# Patient Record
Sex: Female | Born: 1935
Health system: Southern US, Community
[De-identification: ages and names within clinical notes are randomized; demographics above are authoritative.]

## PROBLEM LIST (undated history)

## (undated) DIAGNOSIS — K219 Gastro-esophageal reflux disease without esophagitis: Secondary | ICD-10-CM

## (undated) DIAGNOSIS — R202 Paresthesia of skin: Secondary | ICD-10-CM

## (undated) DIAGNOSIS — I10 Essential (primary) hypertension: Secondary | ICD-10-CM

## (undated) DIAGNOSIS — B3781 Candidal esophagitis: Secondary | ICD-10-CM

## (undated) DIAGNOSIS — L03115 Cellulitis of right lower limb: Secondary | ICD-10-CM

## (undated) DIAGNOSIS — R928 Other abnormal and inconclusive findings on diagnostic imaging of breast: Secondary | ICD-10-CM

## (undated) DIAGNOSIS — Z9889 Other specified postprocedural states: Secondary | ICD-10-CM

## (undated) DIAGNOSIS — S91302A Unspecified open wound, left foot, initial encounter: Secondary | ICD-10-CM

## (undated) DIAGNOSIS — R351 Nocturia: Secondary | ICD-10-CM

## (undated) DIAGNOSIS — J309 Allergic rhinitis, unspecified: Secondary | ICD-10-CM

## (undated) DIAGNOSIS — Z86010 Personal history of colonic polyps: Secondary | ICD-10-CM

## (undated) DIAGNOSIS — M545 Low back pain, unspecified: Secondary | ICD-10-CM

## (undated) DIAGNOSIS — I517 Cardiomegaly: Secondary | ICD-10-CM

## (undated) DIAGNOSIS — J449 Chronic obstructive pulmonary disease, unspecified: Secondary | ICD-10-CM

## (undated) DIAGNOSIS — N183 Chronic kidney disease, stage 3 unspecified: Secondary | ICD-10-CM

## (undated) DIAGNOSIS — D3500 Benign neoplasm of unspecified adrenal gland: Secondary | ICD-10-CM

## (undated) DIAGNOSIS — D126 Benign neoplasm of colon, unspecified: Secondary | ICD-10-CM

## (undated) DIAGNOSIS — D509 Iron deficiency anemia, unspecified: Secondary | ICD-10-CM

## (undated) DIAGNOSIS — S41119A Laceration without foreign body of unspecified upper arm, initial encounter: Secondary | ICD-10-CM

## (undated) DIAGNOSIS — I495 Sick sinus syndrome: Secondary | ICD-10-CM

## (undated) DIAGNOSIS — N179 Acute kidney failure, unspecified: Secondary | ICD-10-CM

## (undated) DIAGNOSIS — J441 Chronic obstructive pulmonary disease with (acute) exacerbation: Secondary | ICD-10-CM

## (undated) DIAGNOSIS — J189 Pneumonia, unspecified organism: Secondary | ICD-10-CM

## (undated) DIAGNOSIS — G3184 Mild cognitive impairment, so stated: Secondary | ICD-10-CM

## (undated) DIAGNOSIS — G250 Essential tremor: Secondary | ICD-10-CM

## (undated) DIAGNOSIS — I871 Compression of vein: Secondary | ICD-10-CM

## (undated) DIAGNOSIS — Z7409 Other reduced mobility: Secondary | ICD-10-CM

## (undated) DIAGNOSIS — L03116 Cellulitis of left lower limb: Secondary | ICD-10-CM

## (undated) DIAGNOSIS — K449 Diaphragmatic hernia without obstruction or gangrene: Secondary | ICD-10-CM

## (undated) DIAGNOSIS — R7309 Other abnormal glucose: Secondary | ICD-10-CM

## (undated) DIAGNOSIS — H3530 Unspecified macular degeneration: Secondary | ICD-10-CM

## (undated) DIAGNOSIS — R200 Anesthesia of skin: Secondary | ICD-10-CM

## (undated) DIAGNOSIS — N3941 Urge incontinence: Secondary | ICD-10-CM

## (undated) DIAGNOSIS — I252 Old myocardial infarction: Secondary | ICD-10-CM

## (undated) DIAGNOSIS — R079 Chest pain, unspecified: Secondary | ICD-10-CM

## (undated) DIAGNOSIS — M5000 Cervical disc disorder with myelopathy, unspecified cervical region: Secondary | ICD-10-CM

## (undated) DIAGNOSIS — M48061 Spinal stenosis, lumbar region without neurogenic claudication: Secondary | ICD-10-CM

## (undated) DIAGNOSIS — W19XXXA Unspecified fall, initial encounter: Secondary | ICD-10-CM

## (undated) DIAGNOSIS — Z8639 Personal history of other endocrine, nutritional and metabolic disease: Secondary | ICD-10-CM

## (undated) DIAGNOSIS — S92309A Fracture of unspecified metatarsal bone(s), unspecified foot, initial encounter for closed fracture: Secondary | ICD-10-CM

## (undated) DIAGNOSIS — F411 Generalized anxiety disorder: Secondary | ICD-10-CM

## (undated) DIAGNOSIS — R252 Cramp and spasm: Secondary | ICD-10-CM

## (undated) DIAGNOSIS — M5126 Other intervertebral disc displacement, lumbar region: Secondary | ICD-10-CM

## (undated) DIAGNOSIS — Z905 Acquired absence of kidney: Secondary | ICD-10-CM

## (undated) DIAGNOSIS — K3532 Acute appendicitis with perforation and localized peritonitis, without abscess: Secondary | ICD-10-CM

## (undated) DIAGNOSIS — I5030 Unspecified diastolic (congestive) heart failure: Secondary | ICD-10-CM

## (undated) DIAGNOSIS — Z79899 Other long term (current) drug therapy: Secondary | ICD-10-CM

## (undated) DIAGNOSIS — Z8719 Personal history of other diseases of the digestive system: Secondary | ICD-10-CM

## (undated) DIAGNOSIS — H3562 Retinal hemorrhage, left eye: Secondary | ICD-10-CM

## (undated) DIAGNOSIS — Z950 Presence of cardiac pacemaker: Secondary | ICD-10-CM

## (undated) DIAGNOSIS — L814 Other melanin hyperpigmentation: Secondary | ICD-10-CM

## (undated) DIAGNOSIS — I83023 Varicose veins of left lower extremity with ulcer of ankle: Secondary | ICD-10-CM

## (undated) DIAGNOSIS — I4819 Other persistent atrial fibrillation: Secondary | ICD-10-CM

## (undated) DIAGNOSIS — I480 Paroxysmal atrial fibrillation: Secondary | ICD-10-CM

## (undated) DIAGNOSIS — I959 Hypotension, unspecified: Secondary | ICD-10-CM

## (undated) DIAGNOSIS — S060X9A Concussion with loss of consciousness of unspecified duration, initial encounter: Secondary | ICD-10-CM

## (undated) DIAGNOSIS — S92353A Displaced fracture of fifth metatarsal bone, unspecified foot, initial encounter for closed fracture: Secondary | ICD-10-CM

## (undated) DIAGNOSIS — I5033 Acute on chronic diastolic (congestive) heart failure: Secondary | ICD-10-CM

## (undated) DIAGNOSIS — M10239 Drug-induced gout, unspecified wrist: Secondary | ICD-10-CM

## (undated) DIAGNOSIS — M25512 Pain in left shoulder: Secondary | ICD-10-CM

## (undated) DIAGNOSIS — M542 Cervicalgia: Secondary | ICD-10-CM

## (undated) DIAGNOSIS — M62830 Muscle spasm of back: Secondary | ICD-10-CM

## (undated) DIAGNOSIS — L821 Other seborrheic keratosis: Secondary | ICD-10-CM

## (undated) DIAGNOSIS — E538 Deficiency of other specified B group vitamins: Secondary | ICD-10-CM

## (undated) DIAGNOSIS — S99929A Unspecified injury of unspecified foot, initial encounter: Secondary | ICD-10-CM

## (undated) DIAGNOSIS — R0600 Dyspnea, unspecified: Secondary | ICD-10-CM

## (undated) DIAGNOSIS — S9032XA Contusion of left foot, initial encounter: Secondary | ICD-10-CM

## (undated) DIAGNOSIS — F0390 Unspecified dementia without behavioral disturbance: Secondary | ICD-10-CM

## (undated) DIAGNOSIS — C669 Malignant neoplasm of unspecified ureter: Secondary | ICD-10-CM

## (undated) DIAGNOSIS — S6991XA Unspecified injury of right wrist, hand and finger(s), initial encounter: Secondary | ICD-10-CM

## (undated) HISTORY — DX: Cellulitis of right lower limb: L03.115

## (undated) HISTORY — DX: Acquired absence of kidney: Z90.5

## (undated) HISTORY — DX: Personal history of other endocrine, nutritional and metabolic disease: Z86.39

## (undated) HISTORY — DX: Personal history of colonic polyps: Z86.010

## (undated) HISTORY — DX: Presence of cardiac pacemaker: Z95.0

## (undated) HISTORY — DX: Iron deficiency anemia, unspecified: D50.9

## (undated) HISTORY — DX: Mild cognitive impairment, so stated: G31.84

## (undated) HISTORY — DX: Personal history of other diseases of the digestive system: Z87.19

## (undated) HISTORY — DX: Other reduced mobility: Z74.09

## (undated) HISTORY — DX: Other specified postprocedural states: Z98.890

## (undated) HISTORY — DX: Chronic kidney disease, stage 3 unspecified: N18.30

## (undated) HISTORY — DX: Unspecified dementia without behavioral disturbance: F03.90

## (undated) HISTORY — DX: Generalized anxiety disorder: F41.1

## (undated) HISTORY — DX: Muscle spasm of back: M62.830

## (undated) HISTORY — DX: Unspecified injury of unspecified foot, initial encounter: S99.929A

## (undated) HISTORY — DX: Pneumonia, unspecified organism: J18.9

## (undated) HISTORY — DX: Chronic obstructive pulmonary disease with (acute) exacerbation: J44.1

## (undated) HISTORY — DX: Chest pain, unspecified: R07.9

## (undated) HISTORY — DX: Hypotension, unspecified: I95.9

## (undated) HISTORY — DX: Old myocardial infarction: I25.2

## (undated) HISTORY — DX: Nocturia: R35.1

## (undated) HISTORY — DX: Cellulitis of left lower limb: L03.116

## (undated) HISTORY — DX: Sick sinus syndrome: I49.5

## (undated) HISTORY — DX: Paroxysmal atrial fibrillation: I48.0

## (undated) HISTORY — PX: KNEE ARTHROSCOPY W/ SYNOVECTOMY: SHX1887

## (undated) HISTORY — DX: Malignant neoplasm of unspecified ureter: C66.9

## (undated) HISTORY — DX: Retinal hemorrhage, left eye: H35.62

## (undated) HISTORY — DX: Acute appendicitis with perforation, localized peritonitis, and gangrene, without abscess: K35.32

## (undated) HISTORY — DX: Chronic obstructive pulmonary disease, unspecified: J44.9

## (undated) HISTORY — DX: Deficiency of other specified B group vitamins: E53.8

## (undated) HISTORY — PX: CHOLECYSTECTOMY: SHX55

## (undated) HISTORY — DX: Acute on chronic diastolic (congestive) heart failure: I50.33

## (undated) HISTORY — PX: REPLACEMENT TOTAL KNEE: SUR1224

## (undated) HISTORY — DX: Diaphragmatic hernia without obstruction or gangrene: K44.9

## (undated) HISTORY — DX: Urge incontinence: N39.41

## (undated) HISTORY — DX: Anesthesia of skin: R20.0

## (undated) HISTORY — DX: Chronic kidney disease, stage 3 (moderate): N18.3

## (undated) HISTORY — DX: Benign neoplasm of colon, unspecified: D12.6

## (undated) HISTORY — PX: TONSILLECTOMY: SUR1361

## (undated) HISTORY — DX: Paresthesia of skin: R20.2

## (undated) HISTORY — DX: Other abnormal glucose: R73.09

## (undated) HISTORY — DX: Other abnormal and inconclusive findings on diagnostic imaging of breast: R92.8

## (undated) HISTORY — DX: Benign neoplasm of unspecified adrenal gland: D35.00

## (undated) HISTORY — PX: BREAST SURGERY: SHX581

## (undated) HISTORY — DX: Candidal esophagitis: B37.81

## (undated) HISTORY — DX: Compression of vein: I87.1

## (undated) HISTORY — PX: EYE SURGERY: SHX253

## (undated) HISTORY — DX: Cramp and spasm: R25.2

## (undated) HISTORY — DX: Unspecified macular degeneration: H35.30

## (undated) HISTORY — PX: NEPHRECTOMY: SHX65

## (undated) HISTORY — DX: Drug-induced gout, unspecified wrist: M10.239

## (undated) HISTORY — DX: Essential tremor: G25.0

## (undated) HISTORY — DX: Other long term (current) drug therapy: Z79.899

## (undated) HISTORY — DX: Essential (primary) hypertension: I10

## (undated) HISTORY — DX: Other intervertebral disc displacement, lumbar region: M51.26

## (undated) HISTORY — DX: Unspecified diastolic (congestive) heart failure: I50.30

## (undated) HISTORY — DX: Low back pain, unspecified: M54.50

## (undated) HISTORY — DX: Allergic rhinitis, unspecified: J30.9

## (undated) HISTORY — DX: Other melanin hyperpigmentation: L81.4

## (undated) HISTORY — DX: Acute kidney failure, unspecified: N17.9

## (undated) HISTORY — DX: Concussion with loss of consciousness of unspecified duration, initial encounter: S06.0X9A

## (undated) HISTORY — DX: Spinal stenosis, lumbar region without neurogenic claudication: M48.061

## (undated) HISTORY — DX: Pain in left shoulder: M25.512

## (undated) HISTORY — DX: Other seborrheic keratosis: L82.1

## (undated) HISTORY — DX: Cardiomegaly: I51.7

## (undated) HISTORY — DX: Cervical disc disorder with myelopathy, unspecified cervical region: M50.00

---
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
## 1997-05-29 ENCOUNTER — Encounter (HOSPITAL_COMMUNITY): Admission: RE | Admit: 1997-05-29 | Discharge: 1997-08-27 | Payer: Self-pay | Admitting: Family Medicine

## 1997-06-13 ENCOUNTER — Inpatient Hospital Stay (HOSPITAL_COMMUNITY): Admission: RE | Admit: 1997-06-13 | Discharge: 1997-06-22 | Payer: Self-pay | Admitting: Urology

## 1997-08-28 ENCOUNTER — Encounter: Admission: RE | Admit: 1997-08-28 | Discharge: 1997-08-28 | Payer: Self-pay | Admitting: Family Medicine

## 1997-12-30 ENCOUNTER — Encounter: Admission: RE | Admit: 1997-12-30 | Discharge: 1997-12-30 | Payer: Self-pay | Admitting: Sports Medicine

## 1998-01-20 ENCOUNTER — Encounter: Admission: RE | Admit: 1998-01-20 | Discharge: 1998-01-20 | Payer: Self-pay | Admitting: Sports Medicine

## 1998-08-31 ENCOUNTER — Encounter: Admission: RE | Admit: 1998-08-31 | Discharge: 1998-08-31 | Payer: Self-pay | Admitting: Family Medicine

## 1998-09-04 ENCOUNTER — Encounter: Admission: RE | Admit: 1998-09-04 | Discharge: 1998-09-04 | Payer: Self-pay | Admitting: Family Medicine

## 1999-07-30 ENCOUNTER — Encounter (INDEPENDENT_AMBULATORY_CARE_PROVIDER_SITE_OTHER): Payer: Self-pay | Admitting: *Deleted

## 1999-07-30 LAB — CONVERTED CEMR LAB

## 1999-09-06 ENCOUNTER — Encounter: Admission: RE | Admit: 1999-09-06 | Discharge: 1999-09-06 | Payer: Self-pay | Admitting: Family Medicine

## 1999-09-08 ENCOUNTER — Encounter: Admission: RE | Admit: 1999-09-08 | Discharge: 1999-09-08 | Payer: Self-pay | Admitting: Family Medicine

## 1999-09-17 ENCOUNTER — Encounter: Admission: RE | Admit: 1999-09-17 | Discharge: 1999-09-17 | Payer: Self-pay | Admitting: Sports Medicine

## 2000-03-16 ENCOUNTER — Encounter: Admission: RE | Admit: 2000-03-16 | Discharge: 2000-03-16 | Payer: Self-pay | Admitting: Family Medicine

## 2000-06-07 ENCOUNTER — Ambulatory Visit (HOSPITAL_COMMUNITY): Admission: RE | Admit: 2000-06-07 | Discharge: 2000-06-07 | Payer: Self-pay | Admitting: Gastroenterology

## 2000-06-07 ENCOUNTER — Encounter (INDEPENDENT_AMBULATORY_CARE_PROVIDER_SITE_OTHER): Payer: Self-pay | Admitting: Specialist

## 2000-07-28 ENCOUNTER — Encounter: Admission: RE | Admit: 2000-07-28 | Discharge: 2000-07-28 | Payer: Self-pay | Admitting: Family Medicine

## 2000-08-03 ENCOUNTER — Encounter: Admission: RE | Admit: 2000-08-03 | Discharge: 2000-08-03 | Payer: Self-pay | Admitting: Family Medicine

## 2000-09-07 ENCOUNTER — Encounter: Admission: RE | Admit: 2000-09-07 | Discharge: 2000-09-07 | Payer: Self-pay | Admitting: Family Medicine

## 2000-12-22 ENCOUNTER — Encounter: Admission: RE | Admit: 2000-12-22 | Discharge: 2000-12-22 | Payer: Self-pay | Admitting: Family Medicine

## 2000-12-28 ENCOUNTER — Encounter: Admission: RE | Admit: 2000-12-28 | Discharge: 2000-12-28 | Payer: Self-pay | Admitting: Family Medicine

## 2001-01-01 ENCOUNTER — Encounter: Admission: RE | Admit: 2001-01-01 | Discharge: 2001-01-01 | Payer: Self-pay | Admitting: Family Medicine

## 2001-01-01 ENCOUNTER — Encounter: Payer: Self-pay | Admitting: Family Medicine

## 2001-01-11 ENCOUNTER — Encounter: Admission: RE | Admit: 2001-01-11 | Discharge: 2001-01-11 | Payer: Self-pay | Admitting: Family Medicine

## 2001-03-01 ENCOUNTER — Encounter: Admission: RE | Admit: 2001-03-01 | Discharge: 2001-03-01 | Payer: Self-pay | Admitting: Family Medicine

## 2001-03-02 ENCOUNTER — Ambulatory Visit (HOSPITAL_COMMUNITY): Admission: RE | Admit: 2001-03-02 | Discharge: 2001-03-02 | Payer: Self-pay | Admitting: Family Medicine

## 2001-03-02 ENCOUNTER — Encounter: Payer: Self-pay | Admitting: Family Medicine

## 2001-03-05 ENCOUNTER — Encounter: Admission: RE | Admit: 2001-03-05 | Discharge: 2001-03-05 | Payer: Self-pay | Admitting: Family Medicine

## 2001-04-16 ENCOUNTER — Encounter: Admission: RE | Admit: 2001-04-16 | Discharge: 2001-04-16 | Payer: Self-pay | Admitting: Family Medicine

## 2001-04-17 ENCOUNTER — Encounter: Admission: RE | Admit: 2001-04-17 | Discharge: 2001-04-17 | Payer: Self-pay | Admitting: Family Medicine

## 2001-04-17 ENCOUNTER — Encounter: Payer: Self-pay | Admitting: Family Medicine

## 2001-04-19 ENCOUNTER — Encounter: Admission: RE | Admit: 2001-04-19 | Discharge: 2001-04-19 | Payer: Self-pay | Admitting: Family Medicine

## 2001-04-23 ENCOUNTER — Encounter: Admission: RE | Admit: 2001-04-23 | Discharge: 2001-04-23 | Payer: Self-pay | Admitting: Family Medicine

## 2001-04-23 ENCOUNTER — Encounter: Payer: Self-pay | Admitting: Family Medicine

## 2001-05-14 ENCOUNTER — Encounter: Payer: Self-pay | Admitting: Neurosurgery

## 2001-05-14 ENCOUNTER — Encounter: Admission: RE | Admit: 2001-05-14 | Discharge: 2001-05-14 | Payer: Self-pay | Admitting: Neurosurgery

## 2001-06-18 ENCOUNTER — Encounter: Admission: RE | Admit: 2001-06-18 | Discharge: 2001-06-18 | Payer: Self-pay | Admitting: Family Medicine

## 2001-07-02 ENCOUNTER — Encounter: Admission: RE | Admit: 2001-07-02 | Discharge: 2001-07-02 | Payer: Self-pay | Admitting: Family Medicine

## 2001-07-19 ENCOUNTER — Encounter: Admission: RE | Admit: 2001-07-19 | Discharge: 2001-07-19 | Payer: Self-pay | Admitting: Family Medicine

## 2001-08-20 ENCOUNTER — Ambulatory Visit (HOSPITAL_COMMUNITY): Admission: RE | Admit: 2001-08-20 | Discharge: 2001-08-20 | Payer: Self-pay | Admitting: Family Medicine

## 2001-08-20 ENCOUNTER — Encounter: Admission: RE | Admit: 2001-08-20 | Discharge: 2001-08-20 | Payer: Self-pay | Admitting: Family Medicine

## 2001-09-04 ENCOUNTER — Ambulatory Visit: Admission: RE | Admit: 2001-09-04 | Discharge: 2001-09-04 | Payer: Self-pay | Admitting: Family Medicine

## 2001-09-04 ENCOUNTER — Encounter (INDEPENDENT_AMBULATORY_CARE_PROVIDER_SITE_OTHER): Payer: Self-pay | Admitting: Cardiology

## 2001-10-30 ENCOUNTER — Other Ambulatory Visit: Admission: RE | Admit: 2001-10-30 | Discharge: 2001-10-30 | Payer: Self-pay | Admitting: Obstetrics and Gynecology

## 2001-11-19 ENCOUNTER — Ambulatory Visit (HOSPITAL_COMMUNITY): Admission: RE | Admit: 2001-11-19 | Discharge: 2001-11-19 | Payer: Self-pay | Admitting: Obstetrics and Gynecology

## 2001-11-19 ENCOUNTER — Encounter: Payer: Self-pay | Admitting: Obstetrics and Gynecology

## 2002-01-28 ENCOUNTER — Encounter: Admission: RE | Admit: 2002-01-28 | Discharge: 2002-01-28 | Payer: Self-pay | Admitting: Family Medicine

## 2002-01-29 ENCOUNTER — Encounter: Admission: RE | Admit: 2002-01-29 | Discharge: 2002-01-29 | Payer: Self-pay | Admitting: Family Medicine

## 2002-02-01 ENCOUNTER — Encounter: Payer: Self-pay | Admitting: Family Medicine

## 2002-02-01 ENCOUNTER — Encounter: Admission: RE | Admit: 2002-02-01 | Discharge: 2002-02-01 | Payer: Self-pay | Admitting: Family Medicine

## 2002-02-15 ENCOUNTER — Encounter: Admission: RE | Admit: 2002-02-15 | Discharge: 2002-02-15 | Payer: Self-pay | Admitting: Sports Medicine

## 2002-05-27 ENCOUNTER — Encounter: Admission: RE | Admit: 2002-05-27 | Discharge: 2002-05-27 | Payer: Self-pay | Admitting: Family Medicine

## 2002-09-09 ENCOUNTER — Encounter: Admission: RE | Admit: 2002-09-09 | Discharge: 2002-09-09 | Payer: Self-pay | Admitting: Family Medicine

## 2002-10-31 ENCOUNTER — Encounter: Admission: RE | Admit: 2002-10-31 | Discharge: 2002-10-31 | Payer: Self-pay | Admitting: Family Medicine

## 2002-11-28 ENCOUNTER — Encounter: Admission: RE | Admit: 2002-11-28 | Discharge: 2002-11-28 | Payer: Self-pay | Admitting: Family Medicine

## 2002-12-05 ENCOUNTER — Encounter: Admission: RE | Admit: 2002-12-05 | Discharge: 2002-12-05 | Payer: Self-pay | Admitting: Family Medicine

## 2002-12-12 ENCOUNTER — Encounter: Admission: RE | Admit: 2002-12-12 | Discharge: 2002-12-12 | Payer: Self-pay | Admitting: Family Medicine

## 2002-12-31 ENCOUNTER — Encounter: Admission: RE | Admit: 2002-12-31 | Discharge: 2002-12-31 | Payer: Self-pay | Admitting: Orthopedic Surgery

## 2003-01-09 ENCOUNTER — Encounter: Admission: RE | Admit: 2003-01-09 | Discharge: 2003-01-09 | Payer: Self-pay | Admitting: Family Medicine

## 2003-01-13 ENCOUNTER — Encounter: Admission: RE | Admit: 2003-01-13 | Discharge: 2003-01-13 | Payer: Self-pay | Admitting: Family Medicine

## 2003-02-10 ENCOUNTER — Encounter: Admission: RE | Admit: 2003-02-10 | Discharge: 2003-02-10 | Payer: Self-pay | Admitting: Family Medicine

## 2003-03-01 ENCOUNTER — Inpatient Hospital Stay (HOSPITAL_COMMUNITY): Admission: EM | Admit: 2003-03-01 | Discharge: 2003-03-06 | Payer: Self-pay | Admitting: Emergency Medicine

## 2003-03-01 DIAGNOSIS — D126 Benign neoplasm of colon, unspecified: Secondary | ICD-10-CM

## 2003-03-01 HISTORY — PX: PACEMAKER INSERTION: SHX728

## 2003-03-01 HISTORY — DX: Benign neoplasm of colon, unspecified: D12.6

## 2003-03-01 HISTORY — PX: PACEMAKER LEAD REMOVAL: SHX5064

## 2003-03-03 ENCOUNTER — Encounter (INDEPENDENT_AMBULATORY_CARE_PROVIDER_SITE_OTHER): Payer: Self-pay | Admitting: Cardiology

## 2003-03-04 ENCOUNTER — Encounter (INDEPENDENT_AMBULATORY_CARE_PROVIDER_SITE_OTHER): Payer: Self-pay | Admitting: Interventional Cardiology

## 2003-05-05 ENCOUNTER — Encounter: Admission: RE | Admit: 2003-05-05 | Discharge: 2003-05-05 | Payer: Self-pay | Admitting: Family Medicine

## 2003-05-08 ENCOUNTER — Encounter: Admission: RE | Admit: 2003-05-08 | Discharge: 2003-05-08 | Payer: Self-pay | Admitting: Family Medicine

## 2003-05-22 ENCOUNTER — Encounter: Admission: RE | Admit: 2003-05-22 | Discharge: 2003-05-22 | Payer: Self-pay | Admitting: Family Medicine

## 2003-05-26 ENCOUNTER — Encounter: Admission: RE | Admit: 2003-05-26 | Discharge: 2003-05-26 | Payer: Self-pay | Admitting: Family Medicine

## 2003-06-05 ENCOUNTER — Encounter: Admission: RE | Admit: 2003-06-05 | Discharge: 2003-06-05 | Payer: Self-pay | Admitting: Family Medicine

## 2003-06-19 ENCOUNTER — Encounter: Admission: RE | Admit: 2003-06-19 | Discharge: 2003-06-19 | Payer: Self-pay | Admitting: Family Medicine

## 2003-07-11 ENCOUNTER — Ambulatory Visit (HOSPITAL_COMMUNITY): Admission: RE | Admit: 2003-07-11 | Discharge: 2003-07-11 | Payer: Self-pay | Admitting: Family Medicine

## 2003-07-11 ENCOUNTER — Encounter: Admission: RE | Admit: 2003-07-11 | Discharge: 2003-07-11 | Payer: Self-pay | Admitting: Family Medicine

## 2003-07-17 ENCOUNTER — Inpatient Hospital Stay (HOSPITAL_COMMUNITY): Admission: EM | Admit: 2003-07-17 | Discharge: 2003-07-20 | Payer: Self-pay | Admitting: Emergency Medicine

## 2003-07-21 ENCOUNTER — Inpatient Hospital Stay (HOSPITAL_COMMUNITY): Admission: EM | Admit: 2003-07-21 | Discharge: 2003-07-25 | Payer: Self-pay | Admitting: Emergency Medicine

## 2003-07-31 ENCOUNTER — Ambulatory Visit (HOSPITAL_COMMUNITY): Admission: RE | Admit: 2003-07-31 | Discharge: 2003-08-01 | Payer: Self-pay | Admitting: Internal Medicine

## 2003-09-26 ENCOUNTER — Other Ambulatory Visit: Admission: RE | Admit: 2003-09-26 | Discharge: 2003-09-26 | Payer: Self-pay | Admitting: Obstetrics and Gynecology

## 2003-10-06 ENCOUNTER — Encounter: Admission: RE | Admit: 2003-10-06 | Discharge: 2003-10-06 | Payer: Self-pay | Admitting: Family Medicine

## 2003-11-14 ENCOUNTER — Ambulatory Visit: Payer: Self-pay | Admitting: Family Medicine

## 2004-01-08 ENCOUNTER — Ambulatory Visit: Payer: Self-pay | Admitting: Critical Care Medicine

## 2004-02-03 ENCOUNTER — Ambulatory Visit (HOSPITAL_COMMUNITY): Admission: RE | Admit: 2004-02-03 | Discharge: 2004-02-03 | Payer: Self-pay | Admitting: Gastroenterology

## 2004-02-03 ENCOUNTER — Encounter (INDEPENDENT_AMBULATORY_CARE_PROVIDER_SITE_OTHER): Payer: Self-pay | Admitting: *Deleted

## 2004-02-20 ENCOUNTER — Ambulatory Visit: Payer: Self-pay | Admitting: Critical Care Medicine

## 2004-03-26 ENCOUNTER — Ambulatory Visit: Payer: Self-pay | Admitting: Critical Care Medicine

## 2004-04-08 ENCOUNTER — Ambulatory Visit: Payer: Self-pay | Admitting: Family Medicine

## 2004-05-13 ENCOUNTER — Ambulatory Visit: Payer: Self-pay | Admitting: Family Medicine

## 2004-05-17 ENCOUNTER — Encounter: Admission: RE | Admit: 2004-05-17 | Discharge: 2004-05-17 | Payer: Self-pay | Admitting: Family Medicine

## 2004-05-25 ENCOUNTER — Ambulatory Visit: Payer: Self-pay | Admitting: Critical Care Medicine

## 2004-06-28 ENCOUNTER — Ambulatory Visit: Payer: Self-pay | Admitting: Critical Care Medicine

## 2004-07-01 ENCOUNTER — Ambulatory Visit: Admission: RE | Admit: 2004-07-01 | Discharge: 2004-07-01 | Payer: Self-pay | Admitting: Critical Care Medicine

## 2004-07-19 ENCOUNTER — Ambulatory Visit: Payer: Self-pay | Admitting: Family Medicine

## 2004-08-06 ENCOUNTER — Ambulatory Visit: Payer: Self-pay | Admitting: Critical Care Medicine

## 2004-08-12 ENCOUNTER — Ambulatory Visit: Payer: Self-pay | Admitting: Family Medicine

## 2004-09-13 ENCOUNTER — Ambulatory Visit: Payer: Self-pay | Admitting: Critical Care Medicine

## 2004-09-21 ENCOUNTER — Emergency Department (HOSPITAL_COMMUNITY): Admission: EM | Admit: 2004-09-21 | Discharge: 2004-09-21 | Payer: Self-pay | Admitting: Emergency Medicine

## 2004-09-23 ENCOUNTER — Ambulatory Visit: Payer: Self-pay | Admitting: Family Medicine

## 2004-10-04 ENCOUNTER — Ambulatory Visit: Payer: Self-pay | Admitting: Family Medicine

## 2004-10-05 ENCOUNTER — Ambulatory Visit: Payer: Self-pay | Admitting: Critical Care Medicine

## 2005-03-09 ENCOUNTER — Ambulatory Visit: Payer: Self-pay | Admitting: Internal Medicine

## 2005-03-10 ENCOUNTER — Ambulatory Visit: Payer: Self-pay | Admitting: Family Medicine

## 2005-04-15 ENCOUNTER — Encounter: Admission: RE | Admit: 2005-04-15 | Discharge: 2005-04-15 | Payer: Self-pay | Admitting: Family Medicine

## 2005-04-15 ENCOUNTER — Ambulatory Visit: Payer: Self-pay | Admitting: Sports Medicine

## 2005-07-12 ENCOUNTER — Ambulatory Visit: Payer: Self-pay | Admitting: Sports Medicine

## 2005-07-17 ENCOUNTER — Ambulatory Visit: Payer: Self-pay | Admitting: Family Medicine

## 2005-07-19 ENCOUNTER — Observation Stay (HOSPITAL_COMMUNITY): Admission: EM | Admit: 2005-07-19 | Discharge: 2005-07-21 | Payer: Self-pay | Admitting: Emergency Medicine

## 2005-08-11 ENCOUNTER — Ambulatory Visit: Payer: Self-pay | Admitting: Family Medicine

## 2005-09-08 ENCOUNTER — Ambulatory Visit: Payer: Self-pay | Admitting: Family Medicine

## 2005-12-18 ENCOUNTER — Emergency Department (HOSPITAL_COMMUNITY): Admission: EM | Admit: 2005-12-18 | Discharge: 2005-12-18 | Payer: Self-pay | Admitting: Emergency Medicine

## 2005-12-22 ENCOUNTER — Ambulatory Visit: Payer: Self-pay | Admitting: Family Medicine

## 2005-12-28 ENCOUNTER — Encounter: Admission: RE | Admit: 2005-12-28 | Discharge: 2005-12-28 | Payer: Self-pay | Admitting: Family Medicine

## 2005-12-28 ENCOUNTER — Ambulatory Visit: Payer: Self-pay | Admitting: Family Medicine

## 2006-01-02 ENCOUNTER — Ambulatory Visit: Payer: Self-pay | Admitting: Family Medicine

## 2006-02-27 ENCOUNTER — Inpatient Hospital Stay (HOSPITAL_COMMUNITY): Admission: EM | Admit: 2006-02-27 | Discharge: 2006-03-01 | Payer: Self-pay | Admitting: Family Medicine

## 2006-02-27 ENCOUNTER — Ambulatory Visit: Payer: Self-pay | Admitting: Family Medicine

## 2006-03-02 ENCOUNTER — Ambulatory Visit: Payer: Self-pay | Admitting: Family Medicine

## 2006-03-16 ENCOUNTER — Ambulatory Visit: Payer: Self-pay | Admitting: Family Medicine

## 2006-03-30 ENCOUNTER — Ambulatory Visit: Payer: Self-pay | Admitting: Family Medicine

## 2006-04-27 DIAGNOSIS — M1710 Unilateral primary osteoarthritis, unspecified knee: Secondary | ICD-10-CM

## 2006-04-27 DIAGNOSIS — J449 Chronic obstructive pulmonary disease, unspecified: Secondary | ICD-10-CM

## 2006-04-27 DIAGNOSIS — I871 Compression of vein: Secondary | ICD-10-CM

## 2006-04-27 DIAGNOSIS — M5000 Cervical disc disorder with myelopathy, unspecified cervical region: Secondary | ICD-10-CM

## 2006-04-27 DIAGNOSIS — F411 Generalized anxiety disorder: Secondary | ICD-10-CM

## 2006-04-27 DIAGNOSIS — I495 Sick sinus syndrome: Secondary | ICD-10-CM | POA: Insufficient documentation

## 2006-04-27 DIAGNOSIS — K219 Gastro-esophageal reflux disease without esophagitis: Secondary | ICD-10-CM

## 2006-04-27 DIAGNOSIS — J302 Other seasonal allergic rhinitis: Secondary | ICD-10-CM

## 2006-04-27 DIAGNOSIS — F064 Anxiety disorder due to known physiological condition: Secondary | ICD-10-CM

## 2006-04-27 DIAGNOSIS — K649 Unspecified hemorrhoids: Secondary | ICD-10-CM | POA: Insufficient documentation

## 2006-04-27 DIAGNOSIS — E785 Hyperlipidemia, unspecified: Secondary | ICD-10-CM

## 2006-04-27 DIAGNOSIS — K449 Diaphragmatic hernia without obstruction or gangrene: Secondary | ICD-10-CM

## 2006-04-27 DIAGNOSIS — J4489 Other specified chronic obstructive pulmonary disease: Secondary | ICD-10-CM

## 2006-04-27 DIAGNOSIS — J309 Allergic rhinitis, unspecified: Secondary | ICD-10-CM

## 2006-04-27 DIAGNOSIS — F410 Panic disorder [episodic paroxysmal anxiety] without agoraphobia: Secondary | ICD-10-CM

## 2006-04-27 DIAGNOSIS — M48061 Spinal stenosis, lumbar region without neurogenic claudication: Secondary | ICD-10-CM

## 2006-04-27 DIAGNOSIS — M479 Spondylosis, unspecified: Secondary | ICD-10-CM | POA: Insufficient documentation

## 2006-04-27 HISTORY — DX: Sick sinus syndrome: I49.5

## 2006-04-27 HISTORY — DX: Cervical disc disorder with myelopathy, unspecified cervical region: M50.00

## 2006-04-27 HISTORY — DX: Chronic obstructive pulmonary disease, unspecified: J44.9

## 2006-04-27 HISTORY — DX: Allergic rhinitis, unspecified: J30.9

## 2006-04-27 HISTORY — DX: Generalized anxiety disorder: F41.1

## 2006-04-27 HISTORY — DX: Diaphragmatic hernia without obstruction or gangrene: K44.9

## 2006-04-27 HISTORY — DX: Compression of vein: I87.1

## 2006-04-27 HISTORY — DX: Other specified chronic obstructive pulmonary disease: J44.89

## 2006-04-28 ENCOUNTER — Encounter (INDEPENDENT_AMBULATORY_CARE_PROVIDER_SITE_OTHER): Payer: Self-pay | Admitting: *Deleted

## 2006-05-01 ENCOUNTER — Ambulatory Visit: Payer: Self-pay | Admitting: Family Medicine

## 2006-07-14 ENCOUNTER — Telehealth: Payer: Self-pay | Admitting: Family Medicine

## 2006-09-04 ENCOUNTER — Ambulatory Visit: Payer: Self-pay | Admitting: Family Medicine

## 2006-09-05 ENCOUNTER — Encounter: Payer: Self-pay | Admitting: Family Medicine

## 2006-09-05 ENCOUNTER — Encounter: Payer: Self-pay | Admitting: *Deleted

## 2006-09-05 LAB — CONVERTED CEMR LAB
CO2: 26 meq/L (ref 19–32)
Chloride: 105 meq/L (ref 96–112)
Cholesterol: 200 mg/dL (ref 0–200)
Creatinine, Ser: 0.87 mg/dL (ref 0.40–1.20)
Glucose, Bld: 95 mg/dL (ref 70–99)
LDL Cholesterol: 134 mg/dL — ABNORMAL HIGH (ref 0–99)
MCHC: 31.7 g/dL (ref 30.0–36.0)
MCV: 92.7 fL (ref 78.0–100.0)
Potassium: 4.4 meq/L (ref 3.5–5.3)
RBC: 4.66 M/uL (ref 3.87–5.11)
RDW: 13.3 % (ref 11.5–14.0)
Sodium: 143 meq/L (ref 135–145)
Total CHOL/HDL Ratio: 5.1
VLDL: 27 mg/dL (ref 0–40)

## 2006-09-07 ENCOUNTER — Encounter (HOSPITAL_COMMUNITY): Admission: RE | Admit: 2006-09-07 | Discharge: 2006-10-03 | Payer: Self-pay | Admitting: Interventional Cardiology

## 2006-09-07 ENCOUNTER — Encounter: Payer: Self-pay | Admitting: Family Medicine

## 2006-09-11 ENCOUNTER — Ambulatory Visit: Payer: Self-pay | Admitting: Family Medicine

## 2006-09-13 ENCOUNTER — Ambulatory Visit: Payer: Self-pay | Admitting: Family Medicine

## 2006-09-15 ENCOUNTER — Ambulatory Visit: Payer: Self-pay | Admitting: Family Medicine

## 2006-09-18 ENCOUNTER — Encounter: Payer: Self-pay | Admitting: Family Medicine

## 2006-09-18 ENCOUNTER — Telehealth: Payer: Self-pay | Admitting: Family Medicine

## 2006-10-06 ENCOUNTER — Encounter: Payer: Self-pay | Admitting: Family Medicine

## 2006-10-16 ENCOUNTER — Ambulatory Visit: Payer: Self-pay | Admitting: Sports Medicine

## 2006-10-18 ENCOUNTER — Inpatient Hospital Stay (HOSPITAL_COMMUNITY): Admission: RE | Admit: 2006-10-18 | Discharge: 2006-10-24 | Payer: Self-pay | Admitting: Orthopedic Surgery

## 2006-10-19 ENCOUNTER — Ambulatory Visit: Payer: Self-pay | Admitting: Physical Medicine & Rehabilitation

## 2006-11-22 ENCOUNTER — Encounter: Admission: RE | Admit: 2006-11-22 | Discharge: 2007-02-20 | Payer: Self-pay | Admitting: Orthopedic Surgery

## 2006-12-12 ENCOUNTER — Telehealth: Payer: Self-pay | Admitting: Family Medicine

## 2006-12-18 ENCOUNTER — Ambulatory Visit: Payer: Self-pay | Admitting: Family Medicine

## 2006-12-18 ENCOUNTER — Observation Stay (HOSPITAL_COMMUNITY): Admission: EM | Admit: 2006-12-18 | Discharge: 2006-12-19 | Payer: Self-pay | Admitting: Emergency Medicine

## 2006-12-18 ENCOUNTER — Encounter: Payer: Self-pay | Admitting: Family Medicine

## 2006-12-19 ENCOUNTER — Encounter: Payer: Self-pay | Admitting: Family Medicine

## 2006-12-19 LAB — CONVERTED CEMR LAB
Cholesterol: 166 mg/dL
HDL: 23 mg/dL
LDL Cholesterol: 111 mg/dL

## 2006-12-21 ENCOUNTER — Ambulatory Visit: Payer: Self-pay | Admitting: Family Medicine

## 2006-12-22 ENCOUNTER — Encounter: Payer: Self-pay | Admitting: Family Medicine

## 2007-01-17 ENCOUNTER — Ambulatory Visit: Payer: Self-pay | Admitting: Family Medicine

## 2007-01-17 LAB — CONVERTED CEMR LAB: Glucose, Bld: 95 mg/dL (ref 70–99)

## 2007-01-18 ENCOUNTER — Ambulatory Visit: Payer: Self-pay | Admitting: Family Medicine

## 2007-01-18 LAB — CONVERTED CEMR LAB: Glucose, Bld: 105 mg/dL — ABNORMAL HIGH (ref 70–99)

## 2007-01-22 ENCOUNTER — Ambulatory Visit: Payer: Self-pay | Admitting: Family Medicine

## 2007-01-22 LAB — CONVERTED CEMR LAB: Hgb A1c MFr Bld: 5.6 %

## 2007-03-02 ENCOUNTER — Telehealth (INDEPENDENT_AMBULATORY_CARE_PROVIDER_SITE_OTHER): Payer: Self-pay | Admitting: *Deleted

## 2007-03-02 ENCOUNTER — Ambulatory Visit: Payer: Self-pay | Admitting: Family Medicine

## 2007-03-06 ENCOUNTER — Telehealth: Payer: Self-pay | Admitting: *Deleted

## 2007-03-07 ENCOUNTER — Telehealth: Payer: Self-pay | Admitting: Family Medicine

## 2007-03-08 ENCOUNTER — Ambulatory Visit: Payer: Self-pay | Admitting: Family Medicine

## 2007-03-08 ENCOUNTER — Encounter: Admission: RE | Admit: 2007-03-08 | Discharge: 2007-03-08 | Payer: Self-pay | Admitting: Family Medicine

## 2007-03-08 LAB — CONVERTED CEMR LAB
Hemoglobin: 14.2 g/dL (ref 12.0–15.0)
MCHC: 33.4 g/dL (ref 30.0–36.0)
RBC: 4.91 M/uL (ref 3.87–5.11)
RDW: 13.3 % (ref 11.5–15.5)
WBC: 9.4 10*3/uL (ref 4.0–10.5)

## 2007-03-12 ENCOUNTER — Ambulatory Visit: Payer: Self-pay | Admitting: Family Medicine

## 2007-03-19 ENCOUNTER — Ambulatory Visit: Payer: Self-pay | Admitting: Family Medicine

## 2007-03-19 LAB — CONVERTED CEMR LAB
Specific Gravity, Urine: 1.025
Urobilinogen, UA: 0.2

## 2007-04-02 ENCOUNTER — Ambulatory Visit: Payer: Self-pay | Admitting: Family Medicine

## 2007-04-10 ENCOUNTER — Encounter: Payer: Self-pay | Admitting: Family Medicine

## 2007-04-11 ENCOUNTER — Encounter: Payer: Self-pay | Admitting: Family Medicine

## 2007-05-21 ENCOUNTER — Ambulatory Visit: Payer: Self-pay | Admitting: Family Medicine

## 2007-05-21 ENCOUNTER — Telehealth: Payer: Self-pay | Admitting: *Deleted

## 2007-05-21 LAB — CONVERTED CEMR LAB
BUN: 15 mg/dL
CO2: 25 meq/L
Calcium: 9.3 mg/dL
Chloride: 107 meq/L
Creatinine, Ser: 0.96 mg/dL
Glucose, Bld: 108 mg/dL — ABNORMAL HIGH
Potassium: 3.8 meq/L
Sodium: 143 meq/L

## 2007-05-23 ENCOUNTER — Encounter: Payer: Self-pay | Admitting: Family Medicine

## 2007-05-25 ENCOUNTER — Encounter: Payer: Self-pay | Admitting: Family Medicine

## 2007-06-13 ENCOUNTER — Ambulatory Visit: Payer: Self-pay | Admitting: Family Medicine

## 2007-06-14 ENCOUNTER — Encounter: Payer: Self-pay | Admitting: Family Medicine

## 2007-06-14 ENCOUNTER — Ambulatory Visit: Payer: Self-pay | Admitting: Family Medicine

## 2007-06-18 ENCOUNTER — Ambulatory Visit: Payer: Self-pay | Admitting: Family Medicine

## 2007-06-18 DIAGNOSIS — I10 Essential (primary) hypertension: Secondary | ICD-10-CM

## 2007-06-18 LAB — CONVERTED CEMR LAB
HDL goal, serum: 40 mg/dL
LDL Goal: 100 mg/dL

## 2007-07-09 ENCOUNTER — Ambulatory Visit (HOSPITAL_COMMUNITY): Admission: RE | Admit: 2007-07-09 | Discharge: 2007-07-09 | Payer: Self-pay | Admitting: Orthopedic Surgery

## 2007-08-09 ENCOUNTER — Ambulatory Visit: Payer: Self-pay | Admitting: Family Medicine

## 2007-09-05 ENCOUNTER — Encounter: Payer: Self-pay | Admitting: Family Medicine

## 2007-09-11 DIAGNOSIS — Z9189 Other specified personal risk factors, not elsewhere classified: Secondary | ICD-10-CM

## 2007-09-11 DIAGNOSIS — R7309 Other abnormal glucose: Secondary | ICD-10-CM

## 2007-09-11 HISTORY — DX: Other abnormal glucose: R73.09

## 2007-10-11 ENCOUNTER — Ambulatory Visit: Payer: Self-pay | Admitting: Family Medicine

## 2007-10-11 LAB — CONVERTED CEMR LAB

## 2007-10-24 ENCOUNTER — Encounter: Payer: Self-pay | Admitting: Family Medicine

## 2007-10-25 ENCOUNTER — Telehealth: Payer: Self-pay | Admitting: Family Medicine

## 2007-11-13 ENCOUNTER — Encounter: Payer: Self-pay | Admitting: Family Medicine

## 2007-11-16 ENCOUNTER — Encounter: Payer: Self-pay | Admitting: Family Medicine

## 2007-11-23 ENCOUNTER — Telehealth: Payer: Self-pay | Admitting: *Deleted

## 2007-11-26 ENCOUNTER — Ambulatory Visit: Payer: Self-pay | Admitting: Family Medicine

## 2007-11-26 DIAGNOSIS — B3781 Candidal esophagitis: Secondary | ICD-10-CM

## 2007-11-26 DIAGNOSIS — Z8719 Personal history of other diseases of the digestive system: Secondary | ICD-10-CM

## 2007-11-26 HISTORY — DX: Personal history of other diseases of the digestive system: Z87.19

## 2007-11-26 HISTORY — DX: Candidal esophagitis: B37.81

## 2008-01-10 ENCOUNTER — Encounter: Payer: Self-pay | Admitting: Family Medicine

## 2008-01-31 ENCOUNTER — Encounter: Payer: Self-pay | Admitting: Family Medicine

## 2008-02-11 ENCOUNTER — Ambulatory Visit: Payer: Self-pay | Admitting: Family Medicine

## 2008-02-29 HISTORY — PX: CORONARY ANGIOPLASTY WITH STENT PLACEMENT: SHX49

## 2008-03-24 ENCOUNTER — Encounter: Payer: Self-pay | Admitting: Family Medicine

## 2008-08-01 ENCOUNTER — Telehealth: Payer: Self-pay | Admitting: *Deleted

## 2008-08-01 ENCOUNTER — Ambulatory Visit: Payer: Self-pay | Admitting: Family Medicine

## 2008-08-20 ENCOUNTER — Telehealth: Payer: Self-pay | Admitting: Family Medicine

## 2008-08-25 ENCOUNTER — Encounter: Payer: Self-pay | Admitting: Family Medicine

## 2008-08-26 ENCOUNTER — Ambulatory Visit: Payer: Self-pay | Admitting: Family Medicine

## 2008-08-26 ENCOUNTER — Encounter: Payer: Self-pay | Admitting: Family Medicine

## 2008-08-28 DIAGNOSIS — I517 Cardiomegaly: Secondary | ICD-10-CM

## 2008-08-28 HISTORY — DX: Cardiomegaly: I51.7

## 2008-09-16 ENCOUNTER — Encounter: Payer: Self-pay | Admitting: Family Medicine

## 2008-09-25 ENCOUNTER — Ambulatory Visit: Payer: Self-pay | Admitting: Family Medicine

## 2008-09-25 DIAGNOSIS — I252 Old myocardial infarction: Secondary | ICD-10-CM

## 2008-09-26 ENCOUNTER — Telehealth: Payer: Self-pay | Admitting: Family Medicine

## 2008-09-26 LAB — CONVERTED CEMR LAB
Alkaline Phosphatase: 85 units/L (ref 39–117)
CO2: 24 meq/L (ref 19–32)
Cholesterol: 148 mg/dL (ref 0–200)
Creatinine, Ser: 1.13 mg/dL (ref 0.40–1.20)
Glucose, Bld: 120 mg/dL — ABNORMAL HIGH (ref 70–99)
HCT: 37.8 % (ref 36.0–46.0)
HDL: 35 mg/dL — ABNORMAL LOW (ref >39)
LDL Cholesterol: 90 mg/dL (ref 0–99)
MCHC: 31 g/dL (ref 30.0–36.0)
MCV: 90.9 fL (ref 78.0–100.0)
RBC: 4.16 M/uL (ref 3.87–5.11)
Total Bilirubin: 0.4 mg/dL (ref 0.3–1.2)
Total CHOL/HDL Ratio: 4.2
Triglycerides: 115 mg/dL (ref <150)
VLDL: 23 mg/dL (ref 0–40)
WBC: 8.2 10*3/uL (ref 4.0–10.5)

## 2008-09-30 ENCOUNTER — Inpatient Hospital Stay (HOSPITAL_COMMUNITY): Admission: EM | Admit: 2008-09-30 | Discharge: 2008-10-03 | Payer: Self-pay | Admitting: Emergency Medicine

## 2008-09-30 ENCOUNTER — Encounter: Payer: Self-pay | Admitting: Family Medicine

## 2008-09-30 ENCOUNTER — Ambulatory Visit: Payer: Self-pay | Admitting: Family Medicine

## 2008-09-30 LAB — CONVERTED CEMR LAB: Urine culture (CF units/mL): 20 CFunits/mL

## 2008-10-01 ENCOUNTER — Encounter: Payer: Self-pay | Admitting: Family Medicine

## 2008-10-01 LAB — CONVERTED CEMR LAB: Troponin I: 0.15 ng/mL — ABNORMAL HIGH

## 2008-10-02 ENCOUNTER — Encounter: Payer: Self-pay | Admitting: Family Medicine

## 2008-10-02 LAB — CONVERTED CEMR LAB
CO2: 25 meq/L
Chloride: 107 meq/L
Creatinine, Ser: 1.08 mg/dL

## 2008-10-03 ENCOUNTER — Encounter: Payer: Self-pay | Admitting: Family Medicine

## 2008-10-03 LAB — CONVERTED CEMR LAB
BUN: 15 mg/dL
MCV: 89.2 fL
Potassium: 4.2 meq/L
RBC: 3.55 M/uL — AB
Sodium: 140 meq/L
WBC: 7.6 10*3/uL

## 2008-10-08 ENCOUNTER — Encounter: Payer: Self-pay | Admitting: Family Medicine

## 2008-10-08 LAB — CONVERTED CEMR LAB
Albumin: 3.5 g/dL
Alkaline Phosphatase: 82 units/L
BUN: 19 mg/dL
CO2: 28 meq/L
Calcium: 8.8 mg/dL
Chloride: 106 meq/L
Glucose, Bld: 116 mg/dL
Potassium: 3.4 meq/L
Total Protein: 5.9 g/dL

## 2008-10-23 ENCOUNTER — Ambulatory Visit: Payer: Self-pay | Admitting: Family Medicine

## 2008-10-23 LAB — CONVERTED CEMR LAB
Calcium: 9 mg/dL (ref 8.4–10.5)
Glucose, Bld: 108 mg/dL — ABNORMAL HIGH (ref 70–99)
MCHC: 31.6 g/dL (ref 30.0–36.0)
MCV: 91.3 fL (ref 78.0–100.0)
Platelets: 304 10*3/uL (ref 150–400)
RBC: 3.81 M/uL — ABNORMAL LOW (ref 3.87–5.11)
RDW: 14 % (ref 11.5–15.5)
Sodium: 144 meq/L (ref 135–145)

## 2008-10-24 ENCOUNTER — Telehealth: Payer: Self-pay | Admitting: Family Medicine

## 2008-10-30 ENCOUNTER — Encounter (HOSPITAL_COMMUNITY): Admission: RE | Admit: 2008-10-30 | Discharge: 2009-01-28 | Payer: Self-pay | Admitting: Interventional Cardiology

## 2008-10-31 ENCOUNTER — Encounter: Payer: Self-pay | Admitting: Family Medicine

## 2008-10-31 ENCOUNTER — Ambulatory Visit: Payer: Self-pay | Admitting: Family Medicine

## 2008-10-31 LAB — CONVERTED CEMR LAB
CO2: 23 meq/L (ref 19–32)
Calcium: 8.4 mg/dL (ref 8.4–10.5)
Potassium: 3.7 meq/L (ref 3.5–5.3)
Sodium: 145 meq/L (ref 135–145)

## 2008-11-04 ENCOUNTER — Telehealth: Payer: Self-pay | Admitting: Family Medicine

## 2008-11-05 ENCOUNTER — Telehealth: Payer: Self-pay | Admitting: Family Medicine

## 2008-11-05 ENCOUNTER — Inpatient Hospital Stay (HOSPITAL_COMMUNITY): Admission: EM | Admit: 2008-11-05 | Discharge: 2008-11-06 | Payer: Self-pay | Admitting: Emergency Medicine

## 2008-11-05 LAB — CONVERTED CEMR LAB: Pro B Natriuretic peptide (BNP): 716 pg/mL — ABNORMAL HIGH

## 2008-11-06 LAB — CONVERTED CEMR LAB
BUN: 14 mg/dL
Calcium: 8.2 mg/dL
Creatinine, Ser: 1.22 mg/dL
GFR calc non Af Amer: 42 mL/min
Glucose, Bld: 114 mg/dL
HDL: 25 mg/dL
Triglycerides: 70 mg/dL

## 2008-11-10 LAB — CONVERTED CEMR LAB
HCT: 29.4 %
Hemoglobin: 10.2 g/dL
Lymphocytes Relative: 20 %
Lymphs Abs: 1.4 10*3/uL
RDW: 14.3 %
WBC: 7 10*3/uL

## 2008-11-12 ENCOUNTER — Encounter: Admission: RE | Admit: 2008-11-12 | Discharge: 2008-11-12 | Payer: Self-pay | Admitting: Family Medicine

## 2008-11-12 ENCOUNTER — Ambulatory Visit: Payer: Self-pay | Admitting: Family Medicine

## 2008-11-13 LAB — CONVERTED CEMR LAB
CO2: 30 meq/L (ref 19–32)
Chloride: 106 meq/L (ref 96–112)
Creatinine, Ser: 1.1 mg/dL (ref 0.40–1.20)
Glucose, Bld: 104 mg/dL — ABNORMAL HIGH (ref 70–99)

## 2008-11-19 ENCOUNTER — Telehealth: Payer: Self-pay | Admitting: Family Medicine

## 2008-12-01 ENCOUNTER — Ambulatory Visit: Payer: Self-pay | Admitting: Family Medicine

## 2008-12-17 ENCOUNTER — Telehealth: Payer: Self-pay | Admitting: Family Medicine

## 2008-12-17 ENCOUNTER — Ambulatory Visit: Payer: Self-pay | Admitting: Family Medicine

## 2008-12-17 ENCOUNTER — Encounter: Admission: RE | Admit: 2008-12-17 | Discharge: 2008-12-17 | Payer: Self-pay | Admitting: Family Medicine

## 2008-12-22 ENCOUNTER — Ambulatory Visit: Payer: Self-pay | Admitting: Family Medicine

## 2008-12-23 ENCOUNTER — Telehealth: Payer: Self-pay | Admitting: Family Medicine

## 2009-01-01 ENCOUNTER — Ambulatory Visit: Payer: Self-pay | Admitting: Sports Medicine

## 2009-01-09 ENCOUNTER — Encounter: Payer: Self-pay | Admitting: Family Medicine

## 2009-02-02 ENCOUNTER — Ambulatory Visit: Payer: Self-pay | Admitting: Family Medicine

## 2009-02-10 ENCOUNTER — Ambulatory Visit: Payer: Self-pay | Admitting: Sports Medicine

## 2009-03-16 ENCOUNTER — Ambulatory Visit: Payer: Self-pay | Admitting: Family Medicine

## 2009-03-24 ENCOUNTER — Encounter: Payer: Self-pay | Admitting: Family Medicine

## 2009-05-05 ENCOUNTER — Encounter: Payer: Self-pay | Admitting: Family Medicine

## 2009-05-11 ENCOUNTER — Ambulatory Visit: Payer: Self-pay | Admitting: Family Medicine

## 2009-05-21 ENCOUNTER — Telehealth: Payer: Self-pay | Admitting: Family Medicine

## 2009-06-22 ENCOUNTER — Inpatient Hospital Stay (HOSPITAL_COMMUNITY): Admission: RE | Admit: 2009-06-22 | Discharge: 2009-06-25 | Payer: Self-pay | Admitting: Orthopedic Surgery

## 2009-07-21 ENCOUNTER — Telehealth: Payer: Self-pay | Admitting: Family Medicine

## 2009-07-22 ENCOUNTER — Encounter: Payer: Self-pay | Admitting: Family Medicine

## 2009-07-23 ENCOUNTER — Ambulatory Visit: Payer: Self-pay | Admitting: Family Medicine

## 2009-07-23 LAB — CONVERTED CEMR LAB
Alkaline Phosphatase: 95 units/L (ref 39–117)
BUN: 35 mg/dL — ABNORMAL HIGH (ref 6–23)
Glucose, Bld: 111 mg/dL — ABNORMAL HIGH (ref 70–99)
Hemoglobin: 11.3 g/dL — ABNORMAL LOW (ref 12.0–15.0)
MCHC: 31.6 g/dL (ref 30.0–36.0)
MCV: 89.7 fL (ref 78.0–100.0)
RBC: 3.99 M/uL (ref 3.87–5.11)
Sodium: 141 meq/L (ref 135–145)
Total Bilirubin: 0.7 mg/dL (ref 0.3–1.2)

## 2009-07-24 ENCOUNTER — Telehealth: Payer: Self-pay | Admitting: Family Medicine

## 2009-08-13 ENCOUNTER — Telehealth: Payer: Self-pay | Admitting: Family Medicine

## 2009-08-18 ENCOUNTER — Encounter: Payer: Self-pay | Admitting: *Deleted

## 2009-08-18 ENCOUNTER — Emergency Department (HOSPITAL_COMMUNITY): Admission: EM | Admit: 2009-08-18 | Discharge: 2009-08-18 | Payer: Self-pay | Admitting: Family Medicine

## 2009-08-20 ENCOUNTER — Ambulatory Visit: Payer: Self-pay | Admitting: Family Medicine

## 2009-08-24 ENCOUNTER — Ambulatory Visit: Payer: Self-pay | Admitting: Family Medicine

## 2009-08-28 ENCOUNTER — Telehealth: Payer: Self-pay | Admitting: Family Medicine

## 2009-09-02 ENCOUNTER — Ambulatory Visit: Payer: Self-pay | Admitting: Family Medicine

## 2009-09-02 DIAGNOSIS — N184 Chronic kidney disease, stage 4 (severe): Secondary | ICD-10-CM | POA: Insufficient documentation

## 2009-09-02 LAB — CONVERTED CEMR LAB
Alkaline Phosphatase: 66 units/L (ref 39–117)
BUN: 32 mg/dL — ABNORMAL HIGH (ref 6–23)
Creatinine, Ser: 1.49 mg/dL — ABNORMAL HIGH (ref 0.40–1.20)
Glucose, Bld: 163 mg/dL — ABNORMAL HIGH (ref 70–99)
Hemoglobin: 10 g/dL — ABNORMAL LOW (ref 12.0–15.0)
MCHC: 32.3 g/dL (ref 30.0–36.0)
MCV: 91.7 fL (ref 78.0–100.0)
RBC: 3.38 M/uL — ABNORMAL LOW (ref 3.87–5.11)
Total Bilirubin: 0.5 mg/dL (ref 0.3–1.2)

## 2009-09-03 DIAGNOSIS — D649 Anemia, unspecified: Secondary | ICD-10-CM

## 2009-09-09 ENCOUNTER — Encounter: Payer: Self-pay | Admitting: Family Medicine

## 2009-09-21 ENCOUNTER — Ambulatory Visit: Payer: Self-pay | Admitting: Family Medicine

## 2009-09-21 ENCOUNTER — Encounter: Payer: Self-pay | Admitting: Family Medicine

## 2009-09-21 LAB — CONVERTED CEMR LAB
HCT: 31.4 % — ABNORMAL LOW (ref 36.0–46.0)
MCV: 91.5 fL (ref 78.0–100.0)
RBC: 3.43 M/uL — ABNORMAL LOW (ref 3.87–5.11)
Retic Ct Pct: 1.8 % (ref 0.4–3.1)
WBC: 11.7 10*3/uL — ABNORMAL HIGH (ref 4.0–10.5)

## 2009-09-22 ENCOUNTER — Encounter: Admission: RE | Admit: 2009-09-22 | Discharge: 2009-09-22 | Payer: Self-pay | Admitting: Family Medicine

## 2009-09-23 ENCOUNTER — Telehealth: Payer: Self-pay | Admitting: Family Medicine

## 2009-09-24 ENCOUNTER — Telehealth: Payer: Self-pay | Admitting: Family Medicine

## 2009-09-25 ENCOUNTER — Telehealth: Payer: Self-pay | Admitting: Family Medicine

## 2009-09-30 ENCOUNTER — Encounter: Payer: Self-pay | Admitting: Family Medicine

## 2009-10-07 ENCOUNTER — Ambulatory Visit: Payer: Self-pay | Admitting: Family Medicine

## 2009-10-07 ENCOUNTER — Encounter: Payer: Self-pay | Admitting: Family Medicine

## 2009-10-07 DIAGNOSIS — E538 Deficiency of other specified B group vitamins: Secondary | ICD-10-CM

## 2009-10-07 HISTORY — DX: Deficiency of other specified B group vitamins: E53.8

## 2009-10-07 LAB — CONVERTED CEMR LAB
Alkaline Phosphatase: 84 units/L (ref 39–117)
Indirect Bilirubin: 0.3 mg/dL (ref 0.0–0.9)
Total Protein: 6.1 g/dL (ref 6.0–8.3)

## 2009-10-12 ENCOUNTER — Ambulatory Visit: Payer: Self-pay | Admitting: Family Medicine

## 2009-10-16 ENCOUNTER — Telehealth: Payer: Self-pay | Admitting: Family Medicine

## 2009-12-09 ENCOUNTER — Ambulatory Visit (HOSPITAL_BASED_OUTPATIENT_CLINIC_OR_DEPARTMENT_OTHER): Admission: RE | Admit: 2009-12-09 | Discharge: 2009-12-09 | Payer: Self-pay | Admitting: Orthopedic Surgery

## 2009-12-15 ENCOUNTER — Telehealth: Payer: Self-pay | Admitting: Family Medicine

## 2010-01-18 ENCOUNTER — Ambulatory Visit: Payer: Self-pay | Admitting: Family Medicine

## 2010-03-11 ENCOUNTER — Telehealth: Payer: Self-pay | Admitting: Family Medicine

## 2010-03-11 DIAGNOSIS — R252 Cramp and spasm: Secondary | ICD-10-CM

## 2010-03-11 HISTORY — DX: Cramp and spasm: R25.2

## 2010-03-12 ENCOUNTER — Encounter: Payer: Self-pay | Admitting: Family Medicine

## 2010-03-12 ENCOUNTER — Ambulatory Visit: Admission: RE | Admit: 2010-03-12 | Discharge: 2010-03-12 | Payer: Self-pay | Source: Home / Self Care

## 2010-03-12 LAB — CONVERTED CEMR LAB
CO2: 26 meq/L (ref 19–32)
Calcium: 8 mg/dL — ABNORMAL LOW (ref 8.4–10.5)
Chloride: 103 meq/L (ref 96–112)
Glucose, Bld: 109 mg/dL — ABNORMAL HIGH (ref 70–99)
Sodium: 142 meq/L (ref 135–145)
TSH: 1.18 microintl units/mL (ref 0.350–4.500)

## 2010-03-15 ENCOUNTER — Telehealth: Payer: Self-pay | Admitting: Family Medicine

## 2010-03-15 ENCOUNTER — Ambulatory Visit
Admission: RE | Admit: 2010-03-15 | Discharge: 2010-03-15 | Payer: Self-pay | Source: Home / Self Care | Attending: Family Medicine | Admitting: Family Medicine

## 2010-03-15 ENCOUNTER — Encounter: Payer: Self-pay | Admitting: Family Medicine

## 2010-03-15 DIAGNOSIS — M10239 Drug-induced gout, unspecified wrist: Secondary | ICD-10-CM | POA: Insufficient documentation

## 2010-03-15 DIAGNOSIS — M19039 Primary osteoarthritis, unspecified wrist: Secondary | ICD-10-CM | POA: Insufficient documentation

## 2010-03-15 HISTORY — DX: Drug-induced gout, unspecified wrist: M10.239

## 2010-03-15 LAB — CONVERTED CEMR LAB: Uric Acid, Serum: 5.8 mg/dL (ref 2.4–7.0)

## 2010-03-16 ENCOUNTER — Telehealth: Payer: Self-pay | Admitting: Family Medicine

## 2010-03-16 ENCOUNTER — Encounter: Payer: Self-pay | Admitting: Family Medicine

## 2010-03-21 ENCOUNTER — Encounter: Payer: Self-pay | Admitting: Obstetrics and Gynecology

## 2010-03-29 ENCOUNTER — Telehealth: Payer: Self-pay | Admitting: Family Medicine

## 2010-03-29 ENCOUNTER — Encounter
Admission: RE | Admit: 2010-03-29 | Discharge: 2010-03-29 | Payer: Self-pay | Source: Home / Self Care | Attending: Family Medicine | Admitting: Family Medicine

## 2010-03-29 ENCOUNTER — Encounter: Payer: Self-pay | Admitting: Family Medicine

## 2010-03-29 ENCOUNTER — Ambulatory Visit
Admission: RE | Admit: 2010-03-29 | Discharge: 2010-03-29 | Payer: Self-pay | Source: Home / Self Care | Attending: Family Medicine | Admitting: Family Medicine

## 2010-03-29 LAB — CONVERTED CEMR LAB
BUN: 17 mg/dL (ref 6–23)
BUN: 17 mg/dL (ref 6–23)
CO2: 28 meq/L (ref 19–32)
Calcium: 8.1 mg/dL — ABNORMAL LOW (ref 8.4–10.5)
Chloride: 104 meq/L (ref 96–112)
Creatinine, Ser: 1.11 mg/dL (ref 0.40–1.20)
Creatinine, Ser: 1.11 mg/dL (ref 0.40–1.20)
Ferritin: 18 ng/mL (ref 10–291)
Folate: 17.7 ng/mL
Folate: 17.7 ng/mL
Hemoglobin: 6 g/dL — CL (ref 12.0–15.0)
Iron: 12 ug/dL — ABNORMAL LOW (ref 42–145)
LDH: 167 units/L (ref 94–250)
MCHC: 29.6 g/dL — ABNORMAL LOW (ref 30.0–36.0)
MCV: 79 fL (ref 78.0–100.0)
Platelets: 301 10*3/uL (ref 150–400)
Potassium: 3.8 meq/L (ref 3.5–5.3)
Pro B Natriuretic peptide (BNP): 785 pg/mL — ABNORMAL HIGH (ref 0.0–100.0)
RBC: 2.57 M/uL — ABNORMAL LOW (ref 3.87–5.11)
RBC: 2.57 M/uL — ABNORMAL LOW (ref 3.87–5.11)
Saturation Ratios: 3 % — ABNORMAL LOW (ref 20–55)
TIBC: 350 ug/dL (ref 250–470)
TIBC: 350 ug/dL (ref 250–470)
TSH: 1.025 microintl units/mL (ref 0.350–4.500)
TSH: 1.025 microintl units/mL (ref 0.350–4.500)
UIBC: 338 ug/dL
Vitamin B-12: 876 pg/mL (ref 211–911)
Vitamin B-12: 876 pg/mL (ref 211–911)
WBC: 7.1 10*3/uL (ref 4.0–10.5)

## 2010-03-30 ENCOUNTER — Telehealth: Payer: Self-pay | Admitting: Family Medicine

## 2010-03-30 NOTE — Assessment & Plan Note (Signed)
Summary: f/u meds per Dr. Randen Kauth/bmc   Vital Signs:  Patient profile:   77 year old female Height:      64 inches Weight:      170.13 pounds BMI:     29.31 BSA:     1.83 Temp:     98.3 degrees F Pulse rate:   88 / minute BP sitting:   143 / 73  Vitals Entered By: Christen Bame CMA (January 18, 2010 4:16 PM) CC: f/u, Hypertension Management, Abdominal Pain Is Patient Diabetic? No Pain Assessment Patient in pain? no        Primary Care Provider:  Yanil Dawe MD  CC:  f/u, Hypertension Management, and Abdominal Pain.  History of Present Illness: CC: Discuss medications Mrs Tango wanted to discuss her current medication regiment with a goal of decreasing the number of medications in her pharmcologic regiment.   She has entered the "Do-nut hole" at the end of the year and her finances are strained by her out-of-pocket medication expenses.    Dyspepsia History:      She has no alarm features of dyspepsia including no history of melena, hematochezia, dysphagia, persistent vomiting, or involuntary weight loss > 5%.  There is a prior history of GERD.  She notes that it has been less than 12 months since the last episode of GERD.  The patient does not have a prior history of documented ulcer disease.  A prior EGD has been done.    Hypertension History:      She denies chest pain, dyspnea with exertion, syncope, and side effects from treatment.  She notes no problems with any antihypertensive medication side effects.  Further comments include: No formal exercise. Marland Kitchen        Positive major cardiovascular risk factors include female age 64 years old or older, hyperlipidemia, and hypertension.  Negative major cardiovascular risk factors include no history of diabetes, negative family history for ischemic heart disease, and non-tobacco-user status.        Positive history for target organ damage include ASHD (either angina/prior MI/prior CABG).  Further assessment for target organ  damage reveals no history of cardiac end-organ damage (CHF/LVH), stroke/TIA, peripheral vascular disease, renal insufficiency, or hypertensive retinopathy.      Habits & Providers  Alcohol-Tobacco-Diet     Alcohol drinks/day: <1     Tobacco Status: quit     Tobacco Counseling: to remain off tobacco products     Year Quit: 2009     Passive Smoke Exposure: yes  Current Problems (verified): 1)  Vitamin B12 Deficiency  (ICD-266.2) 2)  Myocardial Infarction, Hx of  (ICD-412) 3)  Hypertension, Benign Essential  (ICD-401.1) 4)  Ventricular Hypertrophy, Left  (ICD-429.3) 5)  Sick Sinus Syndrome  (ICD-427.81) 6)  Edema-legs,due To Venous Obstruct.  (ICD-459.2) 7)  Chronic Kidney Disease Stage Iii (MODERATE)  (ICD-585.3) 8)  Prediabetes  (ICD-790.29) 9)  Anemia, Normocytic  (ICD-285.9) 10)  Hyperlipidemia  (ICD-272.4) 11)  COPD  (ICD-496) 12)  Anxiety  (ICD-300.00) 13)  Rhinitis, Allergic  (ICD-477.9) 14)  Overweight  (ICD-278.02) 15)  Osteoarthritis, Lower Leg  (ICD-715.96) 16)  Osteoarthritis of Spine, Nos  (ICD-721.90) 17)  Hernia, Hiatal, Noncongenital  (ICD-553.3) 18)  Lumbar Spinal Stenosis  (ICD-724.02) 19)  Disc With Radiculopathy  (ICD-722.71) 20)  Hx of Candidiasis of The Esophagus  (ICD-112.84) 21)  Hemorrhoids, Nos  (ICD-455.6) 22)  Gastroesophageal Reflux, No Esophagitis  (ICD-530.81) 23)  Schatzki's Ring, Hx of  (ICD-V12.79) 24)  Adenomatous Colonic Polyp  (  ICD-211.3)  Current Medications (verified): 1)  Advair Diskus 250-50 Mcg/dose Misc (Fluticasone-Salmeterol) .... Inhale 1 Puff As Directed Twice A Day 2)  Aspirin 325 Mg Tabs (Aspirin) .... Half Tablet By Mouth Daily 3)  Ativan 0.5 Mg Tabs (Lorazepam) .... Take 1to 2  Tablets By Mouth Every Eight Hours, As Needed For Anxiety or Shortness of Breath 4)  Tums E-X 750 Mg Chew (Calcium Carbonate Antacid) .... Take 1 Tablet Twice A Day With Meals 5)  Nitrostat 0.4 Mg  Subl (Nitroglycerin) .Marland Kitchen.. 1 Tablet Under Your Tongue  Every Five Minutes As Needed For Chest Pain. Disp:25 Tab 6)  Spiriva Handihaler 18 Mcg Caps (Tiotropium Bromide Monohydrate) .... Inhale One Powder of One Capsule Once Daily. 7)  Ventolin Hfa 108 (90 Base) Mcg/act Aers (Albuterol Sulfate) .... 2 Sprays Inhaled Four Times A Day, If Needed For Shortness of Breath. Disp:1 Mdi. Refill: 5. 8)  Omeprazole 40 Mg Cpdr (Omeprazole) .... One Tablet By Mouth Once A Day 9)  Simvastatin 40 Mg Tabs (Simvastatin) .Marland Kitchen.. 1 Tablet By Mouth Once A Day At Bedtime 10)  Multivitamins   Tabs (Multiple Vitamin) .Marland Kitchen.. 1 Tablet By Mouth Daily 11)  Tramadol Hcl 50 Mg Tabs (Tramadol Hcl) .Marland Kitchen.. 1 To 2  Tablets Four Times A Day As Needed For Pain 12)  Metoprolol Tartrate 50 Mg Tabs (Metoprolol Tartrate) .... Half Tablet By Mouth Twice A Day 13)  Vitamin B-12 1000 Mcg Tabs (Cyanocobalamin) .... One Tablet By Mouth Daily. 14)  Azithromycin 250 Mg Tabs (Azithromycin) .... Take 2 Tablets By Mouth Initially, Then 1 Tablet Daily For 4 Additional Days.  Patient May Self-Initiate Tx.  Allergies: 1)  Codeine Phosphate (Codeine Phosphate) 2)  * Cox 2 Inhibitors 3)  Singulair (Montelukast Sodium) 4)  * Sotalol  Past History:  Past Surgical History: Bone Scan-Whole Body: L5 face degeneration , bilateral knee joint degeneration - 01/30/2003,  Cardiolite: No evidence ischemia, EF68% - 09/29/2002,  Pacemaker lead extraction & reinsertion - 07/25/2003  Cholecystectomy H/O Ruptured Appendix    Nephrectomy for Transitional Cell Cancer Dr. Leander Rams -, 05/24/2001. Surveillance Cystoscopy was WNL by Dr. Leander Rams - 06/11/2001  Right Knee Replacement (Dr Wynelle Link) 2009 Left Knee Arthroplasty (Dr Maureen Ralphs) 05/2009 Left knee arthroscopy and synovectomy for left knee patella clunk syndome (12/09/09, Dr  Maureen Ralphs)      PMH reviewed for relevance  Social History: Smoking Status:  quit  Physical Exam  General:  alert. cooperative. groomed. speaking in full sentences. No apparent distress.   VS  noted  Lungs:  normal respiratory effort, no accessory muscle use, normal breath sounds, no crackles, and no wheezes.   Heart:  normal rate, regular rhythm, no murmur, and no gallop.   Extremities:  No peripheral edema.    Impression & Recommendations:  Problem # 1:  HYPERTENSION, BENIGN ESSENTIAL (ICD-401.1) Assessment Unchanged  Adequate control. Tolerating medication. No new organ damage. Plan to continue current medication.  Her updated medication list for this problem includes:    Metoprolol Tartrate 50 Mg Tabs (Metoprolol tartrate) ..... Half tablet by mouth twice a day  Orders: [place]- Est Level  3 [id number])  Problem # 2:  HYPERLIPIDEMIA (ICD-272.4) Assessment: Comment Only check LDL next OV.  Her updated medication list for this problem includes:    Simvastatin 40 Mg Tabs (Simvastatin) .Marland Kitchen... 1 tablet by mouth once a day at bedtime  Problem # 3:  GASTROESOPHAGEAL REFLUX, NO ESOPHAGITIS (ICD-530.81)  Adequate control of GER symptoms.  Plan to try trial off of  omeprazole with patient reinitiating for 2-week courses with recurrence of symptoms.  Her updated medication list for this problem includes:    Tums E-x 750 Mg Chew (Calcium carbonate antacid) .Marland Kitchen... Take 1 tablet twice a day with meals    Omeprazole 40 Mg Cpdr (Omeprazole) ..... One tablet by mouth once a day  Orders: [place]- Est Level  3 [id number])  Complete Medication List: 1)  Advair Diskus 250-50 Mcg/dose Misc (Fluticasone-salmeterol) .... Inhale 1 puff as directed twice a day 2)  Aspirin 325 Mg Tabs (Aspirin) .... Half tablet by mouth daily 3)  Ativan 0.5 Mg Tabs (Lorazepam) .... Take 1to 2  tablets by mouth every eight hours, as needed for anxiety or shortness of breath 4)  Tums E-x 750 Mg Chew (Calcium carbonate antacid) .... Take 1 tablet twice a day with meals 5)  Nitrostat 0.4 Mg Subl (Nitroglycerin) .Marland Kitchen.. 1 tablet under your tongue every five minutes as needed for chest pain. disp:25 tab 6)  Spiriva Handihaler 18  Mcg Caps (Tiotropium bromide monohydrate) .... Inhale one powder of one capsule once daily. 7)  Ventolin Hfa 108 (90 Base) Mcg/act Aers (Albuterol sulfate) .... 2 sprays inhaled four times a day, if needed for shortness of breath. disp:1 mdi. refill: 5. 8)  Omeprazole 40 Mg Cpdr (Omeprazole) .... One tablet by mouth once a day 9)  Simvastatin 40 Mg Tabs (Simvastatin) .Marland Kitchen.. 1 tablet by mouth once a day at bedtime 10)  Multivitamins Tabs (Multiple vitamin) .Marland Kitchen.. 1 tablet by mouth daily 11)  Tramadol Hcl 50 Mg Tabs (Tramadol hcl) .Marland Kitchen.. 1 to 2  tablets four times a day as needed for pain 12)  Metoprolol Tartrate 50 Mg Tabs (Metoprolol tartrate) .... Half tablet by mouth twice a day 13)  Vitamin B-12 1000 Mcg Tabs (Cyanocobalamin) .... One tablet by mouth daily. 14)  Azithromycin 250 Mg Tabs (Azithromycin) .... Take 2 tablets by mouth initially, then 1 tablet daily for 4 additional days.  patient may self-initiate tx.  Hypertension Assessment/Plan:      The patient's hypertensive risk group is category C: Target organ damage and/or diabetes.  Today's blood pressure is 143/73.    Patient Instructions: 1)  Please schedule a follow-up appointment in 3 months .  2)  Your Blood Pressure is well controlled today. Prescriptions: AZITHROMYCIN 250 MG TABS (AZITHROMYCIN) Take 2 tablets by mouth initially, then 1 tablet daily for 4 additional days.  Patient may self-initiate tx.  #6 x 3   Entered and Authorized by:   Sherren Mocha Jodie Cavey MD   Signed by:   Sherren Mocha Hence Derrick MD on 01/19/2010   Method used:   Electronically to        [id number]* (retail)       [address]       [place], [place]  [address]       Ph: [phone]       Fax: [phone]   RxID:   [id number]    Orders Added: 1)  [place]- Est Level  3 [id number]     Prevention & Chronic Care Immunizations   Influenza vaccine: Fluvax 3+  (12/01/2008)   Influenza vaccine due: 11/25/2008    Tetanus booster: 05/30/2003: Done.   Tetanus booster due:  05/29/2013    Pneumococcal vaccine: Done.  (02/29/2000)   Pneumococcal vaccine due: None    H. zoster vaccine: Not documented  Colorectal Screening   Hemoccult: Done.  (12/29/1996)   Hemoccult due: Not Indicated    Colonoscopy: abnormal  (10/24/2007)   Colonoscopy  due: 10/23/2017  Other Screening   Pap smear: Advised  (10/11/2007)   Pap smear action/deferral: Not indicated-other  (05/11/2009)   Pap smear due: 10/10/2008    Mammogram: Normal  (11/16/2007)   Mammogram action/deferral: Ordered  (05/11/2009)   Mammogram due: 11/15/2008    DXA bone density scan: Not documented   DXA scan due: Not Indicated    Smoking status: quit  (01/18/2010)  Lipids   Total Cholesterol: 87  (11/06/2008)   LDL: 48  (11/06/2008)   LDL Direct: Not documented   HDL: 25  (11/06/2008)   Triglycerides: 70  (11/06/2008)    SGOT (AST): 13  (10/07/2009)   SGPT (ALT): <8 U/L  (10/07/2009)   Alkaline phosphatase: 84  (10/07/2009)   Total bilirubin: 0.4  (10/07/2009)    Lipid flowsheet reviewed?: Yes   Progress toward LDL goal: At goal  Hypertension   Last Blood Pressure: 143 / 73  (01/18/2010)   Serum creatinine: 1.16  (09/21/2009)   Serum potassium 3.8  (09/21/2009)    Hypertension flowsheet reviewed?: Yes   Progress toward BP goal: At goal  Self-Management Support :   Personal Goals (by the next clinic visit) :      Personal blood pressure goal: 145/90  (09/21/2009)     Personal LDL goal: 100  (12/01/2008)    Hypertension self-management support: Written self-care plan, Education handout  (12/01/2008)    Hypertension self-management support not done because: Good outcomes  (01/18/2010)    Lipid self-management support: Not documented     Lipid self-management support not done because: Good outcomes  (01/18/2010)

## 2010-03-30 NOTE — Consult Note (Addendum)
Summary: [facility]  [place] By: Beryle Lathe 09/10/2009 12:20:03  _____________________________________________________________________  External Attachment:    Type:   Image     Comment:   External Document
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
## 2010-03-30 NOTE — Assessment & Plan Note (Signed)
Summary: fu breathing/kh   Vital Signs:  Patient profile:   77 year old female Height:      63.5 inches Weight:      162 pounds BMI:     28.35 BSA:     1.78 O2 Sat:      97 % on Room air Temp:     97.2 degrees F Pulse rate:   89 / minute BP sitting:   95 / 56  Vitals Entered By: Christen Bame CMA (August 24, 2009 9:14 AM)  O2 Flow:  Room air CC: f/u breathing Is Patient Diabetic? No Pain Assessment Patient in pain? no        Primary Care Provider:  TODD MCDIARMID MD  CC:  f/u breathing.  History of Present Illness: breathing: overall feeling much better. completed avelox course yesterday and has las prednisone today.  using combivent as recommended every 4 hrs (has been setting alarm clock). had inadvertantly stopped advair but plans to resume today.  denies fevers.  she does notice it gets worse in the heat.  she has appt with her pulmonologist in mid july.  Current Medications (verified): 1)  Advair Diskus 250-50 Mcg/dose Misc (Fluticasone-Salmeterol) .... Inhale 1 Puff As Directed Twice A Day 2)  Aspirin 325 Mg Tabs (Aspirin) .Marland Kitchen.. 1 Tablet By Mouth Daily 3)  Ativan 0.5 Mg Tabs (Lorazepam) .... Take 1to 2  Tablets By Mouth Every Eight Hours, As Needed For Anxiety or Shortness of Breath 4)  Tums E-X 750 Mg Chew (Calcium Carbonate Antacid) .... Take 1 Tablet Twice A Day With Meals 5)  Nitrostat 0.4 Mg  Subl (Nitroglycerin) .Marland Kitchen.. 1 Tablet Under Your Tongue Every Five Minutes As Needed For Chest Pain. Disp:25 Tab 6)  Spiriva Handihaler 18 Mcg Caps (Tiotropium Bromide Monohydrate) .... Inhale One Powder of One Capsule Once Daily. 7)  Ventolin Hfa 108 (90 Base) Mcg/act Aers (Albuterol Sulfate) .... 2 Sprays Inhaled Four Times A Day, If Needed For Shortness of Breath. Disp:1 Mdi. Refill: 5. 8)  Omeprazole 40 Mg Cpdr (Omeprazole) .... One Tablet By Mouth Once A Day 9)  Simvastatin 40 Mg Tabs (Simvastatin) .Marland Kitchen.. 1 Tablet By Mouth Once A Day At Bedtime 10)  Multivitamins   Tabs  (Multiple Vitamin) .Marland Kitchen.. 1 Tablet By Mouth Daily 11)  Metoprolol-Hydrochlorothiazide 50-25 Mg Tabs (Metoprolol-Hydrochlorothiazide) .... Half Tablet By Mouth Twice A Day 12)  Tramadol Hcl 50 Mg Tabs (Tramadol Hcl) .Marland Kitchen.. 1 To 2  Tablets Four Times A Day As Needed For Pain 13)  Combivent 18-103 Mcg/act Aero (Ipratropium-Albuterol) .... 2 Puffs Every 4 Hours With Spacer.  Allergies (verified): 1)  Codeine Phosphate (Codeine Phosphate) 2)  * Cox 2 Inhibitors 3)  Singulair (Montelukast Sodium) 4)  * Sotalol  Past History:  Past medical, surgical, family and social histories (including risk factors) reviewed for relevance to current acute and chronic problems.  Past Medical History: Reviewed history from 11/10/2008 and no changes required. Asthmatic Bronchitis, Chronic, COPD, Stage 3 (05/06), PFT(09/06) at [facility] (Dr. Casper Harrison, [place]):  FEV1 61%; FVC 77%, nl diffusion capacity c/w moderate COPD.  Post-Bronchodilator FEV1 increased 19%    Hosp`n 05/07 - Shingles Chest Pain, Echo-Nl EF, Nl LV, No wall motion abnl -07/25/2003, ETT 11/98; poor aerobic fitness ETT 11/98; adeq, low prob of obstructive CAD - 08/28/2001  Pacemaker placement, dual chamber placed 03/05/2003 (Dr Lovena Le) Hospitalized for NSTEMI ( 10/02/2008) PCI (10/02/2008, Dr Daneen Schick): S/P NSTEMI,  RCA [place] placed: 99% lesion to 0% lesion. Circumflex Obtuse marginal with  90% stenosis (left for medical mangement for now)  Hospitalization (9/8-9/10, Dr Daneen Schick, III, Cardiology) for Paroxysmal Atrial Fibrillation with RVR and anginal pain secondary to demand/supply mismatch in setting of RVR with known circumflex artery branch disease.    Left adrenal mass by 6/99 CT, stable for many years - incidentaloma, Adrenal adenoma, left, stable on CT 2007  Only one Kidney - surgery transitional cell cancer, ONLY ONE KIDNEY!!!,   Surveillance colonoscopy 2005 by Dr. Cristina Gong found colonic adenomatous polyps Hernia,  umbilical  Central HNP w/ L4&5 root encroachment - 05/24/2001,   Spinal Stenosis: L-S MRI: L4-5 Spinal stenosis, - 03/03/2006, Spondylolithesis L5-S1(mild), DJD L4-5 on X-Ray 11/04 R. Hip Xray Unremarkable - 04/19/2001 Right heel spur 11/04  Has medication nebulizer device at home Vocal Polp excised by Dr. Purcell Nails (ENT) "many years ago"  Past Surgical History: Reviewed history from 07/22/2009 and no changes required. Bone Scan-Whole Body: L5 face degeneration , bilateral knee joint degeneration - 01/30/2003,  Cardiolite: No evidence ischemia, EF68% - 09/29/2002,   Cholecystectomy H/O Ruptured Appendix    Nephrectomy for Transitional Cell Cancer Dr. Leander Rams -, 05/24/2001. Surveillance Cystoscopy was WNL by Dr. Leander Rams - 06/11/2001  Right Knee Replacement (Dr Wynelle Link) 2009 Left Knee Arthroplasty (Dr Maureen Ralphs) 05/2009       Pacemaker lead extraction & reinsertion - 07/25/2003    Family History: Reviewed history from 06/18/2007 and no changes required. Father with MI age<55 yrs. Mother died age 26 years-old. Mother with Dementia of Alzheimers' Type.  Social History: Reviewed history from 05/11/2009 and no changes required. 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   Pt has home nebulizer for inhalation therapies. Former Smoker  Review of Systems       per HPI  Physical Exam  General:  alert. cooperative. groomed. speaking in full sentences. No apparent distress.   VS noted - improved O2 sat Lungs:  normal respiratory effort, no accessory muscle use CTAB.   Heart:  regular rhythm, no murmur, and no gallop.     Impression & Recommendations:  Problem # 1:  PNEUMONIA (ICD-486) Assessment Improved  appears to be resolving at this time.  see pt instructiosn for plan.   The following medications were removed from the medication list:    Avelox 400 Mg  Tabs (Moxifloxacin hcl) .Marland Kitchen... 1 daily for 7 days. (started 08/18/09 by ucc)  Orders: [place]- Est Level  3 [id number])  Complete Medication List: 1)  Advair Diskus 250-50 Mcg/dose Misc (Fluticasone-salmeterol) .... Inhale 1 puff as directed twice a day 2)  Aspirin 325 Mg Tabs (Aspirin) .Marland Kitchen.. 1 tablet by mouth daily 3)  Ativan 0.5 Mg Tabs (Lorazepam) .... Take 1to 2  tablets by mouth every eight hours, as needed for anxiety or shortness of breath 4)  Tums E-x 750 Mg Chew (Calcium carbonate antacid) .... Take 1 tablet twice a day with meals 5)  Nitrostat 0.4 Mg Subl (Nitroglycerin) .Marland Kitchen.. 1 tablet under your tongue every five minutes as needed for chest pain. disp:25 tab 6)  Spiriva Handihaler 18 Mcg Caps (Tiotropium bromide monohydrate) .... Inhale one powder of one capsule once daily. 7)  Ventolin Hfa 108 (90 Base) Mcg/act Aers (Albuterol sulfate) .... 2 sprays inhaled four times a day, if needed for shortness of breath. disp:1 mdi. refill: 5. 8)  Omeprazole 40 Mg Cpdr (Omeprazole) .... One tablet by mouth once a day  9)  Simvastatin 40 Mg Tabs (Simvastatin) .Marland Kitchen.. 1 tablet by mouth once a day at bedtime 10)  Multivitamins Tabs (Multiple vitamin) .Marland Kitchen.. 1 tablet by mouth daily 11)  Metoprolol-hydrochlorothiazide 50-25 Mg Tabs (Metoprolol-hydrochlorothiazide) .... Half tablet by mouth twice a day 12)  Tramadol Hcl 50 Mg Tabs (Tramadol hcl) .Marland Kitchen.. 1 to 2  tablets four times a day as needed for pain 13)  Combivent 18-103 Mcg/act Aero (Ipratropium-albuterol) .... 2 puffs every 4 hours with spacer.  Patient Instructions: 1)  Finish your prednisone today.  This is why you are having some trouble sleeping and your sleep should improve as this wears out of your system. 2)  Continue using the combivent every 6 hours until Thursday then switch back to using your spiriva once daily and your ventolin as needed. 3)  Resume your Advair today. 4)  Call if you get fevers, worsening shortness of breath, etc.

## 2010-03-30 NOTE — Progress Notes (Signed)
  Medications Added PREDNISONE 10 MG TABS (PREDNISONE) 3 tablets by mouth daily for 7 days       Phone Note Call from Patient   Caller: Patient Call For: Sherren Mocha McDiarmid MD Reason for Call: Acute Illness Summary of Call: Increase cough with increase purlent sputum, mild increase dyspnea. tickle in throat.  Subjective fever.  Started her standing Z-Pak (5 day) two days ago. Starting to feel somewhat better.  Breathing feels somewhat better.  A/ Likely AECOPD P/Continue Z-Pak to completion    Rx for Prednisone 30 mg daily for 7 days.  Patient can self initiate if condition worsens.  If she becomes signigicantly SOB she is to go to ED for evaluation.   Initial call taken by: Sherren Mocha McDiarmid MD,  August 13, 2009 2:49 PM    New/Updated Medications: PREDNISONE 10 MG TABS (PREDNISONE) 3 tablets by mouth daily for 7 days Prescriptions: PREDNISONE 10 MG TABS (PREDNISONE) 3 tablets by mouth daily for 7 days  #21 x 0   Entered and Authorized by:   Sherren Mocha McDiarmid MD   Signed by:   Sherren Mocha McDiarmid MD on 08/13/2009   Method used:   Electronically to        [facility] (retail)       [address]       [place], [place]  [address]       Ph: [phone] or [phone]       Fax: [phone]   RxID:   [id number]

## 2010-03-30 NOTE — Progress Notes (Signed)
Summary: triage  Medications Added ANUSOL-HC 25 MG SUPP (HYDROCORTISONE ACETATE) 1 suppository inserted into rectum three times a day for up to two weeks, if needed for hemorroids       Phone Note Call from Patient Call back at [facility] Phone [phone]   Caller: Patient Summary of Call: Pt has real bad case of hemmroids and wondering if she can get something for them. Initial call taken by: [name],  May 21, 2009 4:35 PM  Follow-up for Phone Call        [name].  states this is the worst case of hemmorhoids she has had in years. wants "that suppository " called in. told her I will ask pcp Follow-up by: [name] RN,  May 21, 2009 4:40 PM  Additional Follow-up for Phone Call Additional follow up Details #1::        Please let patient know a prescription for [facility] suppositories sent to [facility] drug on [address].  Recommend Sitz baths two to three times a day to relieve pain and itching of hemorrhoids.  Additional Follow-up by: [name] MD,  May 22, 2009 8:01 AM    New/Updated Medications: ANUSOL-HC 25 MG SUPP (HYDROCORTISONE ACETATE) 1 suppository inserted into rectum three times a day for up to two weeks, if needed for hemorroids Prescriptions: ANUSOL-HC 25 MG SUPP (HYDROCORTISONE ACETATE) 1 suppository inserted into rectum three times a day for up to two weeks, if needed for hemorroids  #24 x 1   Entered and Authorized by:   [name] MD   Signed by:   [name] MD on 05/22/2009   Method used:   Electronically to        [facility] (retail)       [address]       [place], [place]  [address]       Ph: [phone] or [phone]       Fax: [phone]   RxID:   [id number]   Appended Document: triage Pt informed

## 2010-03-30 NOTE — Assessment & Plan Note (Signed)
Summary: TO SEE DR [name],TCB  Medications Added METOPROLOL TARTRATE 50 MG TABS (METOPROLOL TARTRATE) Half tablet by mouth twice a day      Allergies Added:  Nurse Visit   Vital Signs:  Patient profile:   77 year old female Height:      64 inches (162.56 cm) Weight:      162 pounds (73.64 kg) BMI:     27.91 BSA:     1.79 O2 Sat:      97 % on Room air Temp:     98.4 degrees F (36.9 degrees C) Pulse rate:   84 / minute BP sitting:   96 / 64  (left arm) Cuff size:   regular  Vitals Entered By: Elray Mcgregor RN (September 02, 2009 9:13 AM)  O2 Flow:  Room air CC: Follow up pneumonia Is Patient Diabetic? No Pain Assessment Patient in pain? no        Habits & Providers  Alcohol-Tobacco-Diet     Alcohol drinks/day: <1     Tobacco Status: never     Year Quit: 2009     Passive Smoke Exposure: yes  Primary Care Provider:  Koen Antilla MD  CC:  Follow up pneumonia.  History of Present Illness: Pt diagnosed with Pneumonia (Streaking infiltrate right upper lobe) at [facility].  Patient had been on Z-pak for three days at time of dx and oral prednisone.  Pt changed to Avelox and treated for 5 days.  Patient clinically improved over next couple of weeks.  Her dyspnea with exertion persists, and may be somewhat worse than prior to this recent illness. dyspnea in heat and poor air quality days is particularly difficult.   No syncope, no chest pain, no cough or fever. No wheezing.  No PND, orthopnea or leg swelling. No palpitations in last week. ROS in HPI Medication listed and updated in medication list. Smoking status documented by nursing and reviewed.  [name] is to see Dr Casper Harrison [facility]) next week for her COPD.    Current Medications (verified): 1)  Advair Diskus 250-50 Mcg/dose Misc (Fluticasone-Salmeterol) .... Inhale 1 Puff As Directed Twice A Day 2)  Aspirin 325 Mg Tabs (Aspirin) .Marland Kitchen.. 1 Tablet By Mouth Daily 3)  Ativan 0.5 Mg Tabs (Lorazepam)  .... Take 1to 2  Tablets By Mouth Every Eight Hours, As Needed For Anxiety or Shortness of Breath 4)  Tums E-X 750 Mg Chew (Calcium Carbonate Antacid) .... Take 1 Tablet Twice A Day With Meals 5)  Nitrostat 0.4 Mg  Subl (Nitroglycerin) .Marland Kitchen.. 1 Tablet Under Your Tongue Every Five Minutes As Needed For Chest Pain. Disp:25 Tab 6)  Spiriva Handihaler 18 Mcg Caps (Tiotropium Bromide Monohydrate) .... Inhale One Powder of One Capsule Once Daily. 7)  Ventolin Hfa 108 (90 Base) Mcg/act Aers (Albuterol Sulfate) .... 2 Sprays Inhaled Four Times A Day, If Needed For Shortness of Breath. Disp:1 Mdi. Refill: 5. 8)  Omeprazole 40 Mg Cpdr (Omeprazole) .... One Tablet By Mouth Once A Day 9)  Simvastatin 40 Mg Tabs (Simvastatin) .Marland Kitchen.. 1 Tablet By Mouth Once A Day At Bedtime 10)  Multivitamins   Tabs (Multiple Vitamin) .Marland Kitchen.. 1 Tablet By Mouth Daily 11)  Metoprolol-Hydrochlorothiazide 50-25 Mg Tabs (Metoprolol-Hydrochlorothiazide) .... Half Tablet By Mouth Twice A Day 12)  Tramadol Hcl 50 Mg Tabs (Tramadol Hcl) .Marland Kitchen.. 1 To 2  Tablets Four Times A Day As Needed For Pain 13)  Combivent 18-103 Mcg/act Aero (Ipratropium-Albuterol) .... 2 Puffs Every 4 Hours  With Spacer.  Allergies (verified): 1)  Codeine Phosphate (Codeine Phosphate) 2)  * Cox 2 Inhibitors 3)  Singulair (Montelukast Sodium) 4)  * Sotalol PMH reviewed for relevance   Orders Added: 1)  Comp Met-[facility] [[id number]] 2)  CBC-[facility] [id number] 3)  [place]- Est Level  3 [id number]  Review of Systems       Appetite is less. Not eating sweets.  CV:  Denies chest pain or discomfort.   Physical Exam  General:  alert. cooperative. groomed. speaking in full sentences. No apparent distress.   VS noted - Sao2 97% on Room air Lungs:  normal respiratory effort, no accessory muscle use, normal breath sounds, no crackles, and no wheezes.   Heart:  normal rate, regular rhythm, no murmur, and no JVD.   Extremities:  trace left pedal edema and trace right pedal edema.     Prescriptions: ATIVAN 0.5 MG TABS (LORAZEPAM) Take 1to 2  tablets by mouth every eight hours, as needed for anxiety or shortness of breath  #60 x 5   Entered and Authorized by:   Sherren Mocha Lesley Galentine MD   Signed by:   Sherren Mocha Emerald Gehres MD on 09/02/2009   Method used:   Handwritten   RxID:   [id number] METOPROLOL TARTRATE 50 MG TABS (METOPROLOL TARTRATE) Half tablet by mouth twice a day  #30 x 5   Entered and Authorized by:   Sherren Mocha Ethelmae Ringel MD   Signed by:   Sherren Mocha Airyana Sprunger MD on 09/02/2009   Method used:   Handwritten   RxID:   [id number]      Vital Signs:  Patient profile:   77 year old female Height:      64 inches (162.56 cm) Weight:      162 pounds (73.64 kg) BMI:     27.91 BSA:     1.79 O2 Sat:      97 % Temp:     98.4 degrees F (36.9 degrees C) Pulse rate:   84 / minute BP sitting:   96 / 64  (left arm) Cuff size:   regular  Vitals Entered By: Elray Mcgregor RN (September 02, 2009 9:13 AM)  O2 Flow:  Room air   Serial Vital Signs/Assessments:  Time      Position  BP       Pulse  Resp  Temp     By 10:32 AM  R Arm     102/78                         Sherren Mocha Olney Monier MD 10:32 AM  L Arm     102/78                         Sherren Mocha Joette Schmoker MD   Impression & Recommendations:  Problem # 1:  PNEUMONIA (ICD-486) Assessment Improved Resolved pneumonia.  Will check CXR to assess resolution of possible RUL infiltrate in early August ( ~ 6 weeks after ^/21/11 CXR with possible infiltrate).   Orders: Comp Met[facility] [id number]) CBC-[facility] [id number]) [place]- Est Level  3 [id number])  Problem # 2:  HYPERTENSION, BENIGN ESSENTIAL (ICD-401.1)  SBP is persitently low.  Suspect patient's continued weight loss has corrected much of the cause of her hypertension. Check Hbg.  Will stop HCTZ.  Continue Metoprolol 25 mg twice a day for her paroxysmal A-Fib events.  RTC 2 weeks to assess BP.   The following medications were removed from the medication list:  Metoprolol-hydrochlorothiazide 50-25 Mg  Tabs (Metoprolol-hydrochlorothiazide) ..... Half tablet by mouth twice a day Her updated medication list for this problem includes:    Metoprolol Tartrate 50 Mg Tabs (Metoprolol tartrate) ..... Half tablet by mouth twice a day  Orders: [place]- Est Level  3 [id number])  Problem # 3:  RENAL INSUFFICIENCY (ICD-588.9) Assessment: Comment Only Will check BMET.  Pt with solitary kidney after neprectomy for transitional cell cancer back in 1990s.  Problem # 4:  Screening Breast Cancer (ICD-V76.10) Reminded to arrange her mammogram at [facility].   Complete Medication List: 1)  Advair Diskus 250-50 Mcg/dose Misc (Fluticasone-salmeterol) .... Inhale 1 puff as directed twice a day 2)  Aspirin 325 Mg Tabs (Aspirin) .Marland Kitchen.. 1 tablet by mouth daily 3)  Ativan 0.5 Mg Tabs (Lorazepam) .... Take 1to 2  tablets by mouth every eight hours, as needed for anxiety or shortness of breath 4)  Tums E-x 750 Mg Chew (Calcium carbonate antacid) .... Take 1 tablet twice a day with meals 5)  Nitrostat 0.4 Mg Subl (Nitroglycerin) .Marland Kitchen.. 1 tablet under your tongue every five minutes as needed for chest pain. disp:25 tab 6)  Spiriva Handihaler 18 Mcg Caps (Tiotropium bromide monohydrate) .... Inhale one powder of one capsule once daily. 7)  Ventolin Hfa 108 (90 Base) Mcg/act Aers (Albuterol sulfate) .... 2 sprays inhaled four times a day, if needed for shortness of breath. disp:1 mdi. refill: 5. 8)  Omeprazole 40 Mg Cpdr (Omeprazole) .... One tablet by mouth once a day 9)  Simvastatin 40 Mg Tabs (Simvastatin) .Marland Kitchen.. 1 tablet by mouth once a day at bedtime 10)  Multivitamins Tabs (Multiple vitamin) .Marland Kitchen.. 1 tablet by mouth daily 11)  Tramadol Hcl 50 Mg Tabs (Tramadol hcl) .Marland Kitchen.. 1 to 2  tablets four times a day as needed for pain 12)  Combivent 18-103 Mcg/act Aero (Ipratropium-albuterol) .... 2 puffs every 4 hours with spacer. 13)  Metoprolol Tartrate 50 Mg Tabs (Metoprolol tartrate) .... Half tablet by mouth twice a  day   Patient Instructions: 1)  Please schedule a follow-up appointment in 2 weeks. May double book.  2)  Stop the combination Metoprolol-HCTZ tablets 3)  Start Metoprolol 50 mg tablet, half tablet twice a day.     CXR  Procedure date:  08/18/2009  Findings:       Clinical Data: Cough.  Shortness of breath.COPD.    CHEST - 2 VIEW    Comparison: 11/12/2008    Findings: Changes of COPD again demonstrated.  On the lateral   projection, there is increase streaky opacity seen in the anterior   upper lobe, likely on the right side, suspicious for pneumonia.    There is no evidence of pleural effusion.  Heart size is stable   within normal limits.  Dual lead transvenous pacemaker remains in   place.    IMPRESSION:    1.  Increase streaky opacity in the anterior right upper lobe, best   visualized on lateral projection, suspicious for pneumonia. Post-   treatment  radiographic followup recommended to confirm resolution.   2.  COPD.    Read By:  Marlaine Hind,  M.D.   CXR  Procedure date:  08/18/2009  Findings:       Clinical Data: Cough.  Shortness of breath.COPD.    CHEST - 2 VIEW    Comparison: 11/12/2008    Findings: Changes of COPD again demonstrated.  On the lateral   projection, there is increase streaky opacity seen in the  anterior   upper lobe, likely on the right side, suspicious for pneumonia.    There is no evidence of pleural effusion.  Heart size is stable   within normal limits.  Dual lead transvenous pacemaker remains in   place.    IMPRESSION:    1.  Increase streaky opacity in the anterior right upper lobe, best   visualized on lateral projection, suspicious for pneumonia. Post-   treatment  radiographic followup recommended to confirm resolution.   2.  COPD.    Read By:  Marlaine Hind,  M.D.   Prevention & Chronic Care Immunizations   Influenza vaccine: Fluvax 3+  (12/01/2008)   Influenza vaccine due: 11/25/2008    Tetanus booster:  05/30/2003: Done.   Tetanus booster due: 05/29/2013    Pneumococcal vaccine: Done.  (02/29/2000)   Pneumococcal vaccine due: None    H. zoster vaccine: Not documented  Colorectal Screening   Hemoccult: Done.  (12/29/1996)   Hemoccult due: Not Indicated    Colonoscopy: abnormal  (10/24/2007)   Colonoscopy due: 10/23/2017  Other Screening   Pap smear: Advised  (10/11/2007)   Pap smear action/deferral: Not indicated-other  (05/11/2009)   Pap smear due: 10/10/2008    Mammogram: Normal  (11/16/2007)   Mammogram action/deferral: Ordered  (05/11/2009)   Mammogram due: 11/15/2008    DXA bone density scan: Not documented   DXA scan due: Not Indicated    Smoking status: never  (09/02/2009)  Lipids   Total Cholesterol: 87  (11/06/2008)   LDL: 48  (11/06/2008)   LDL Direct: Not documented   HDL: 25  (11/06/2008)   Triglycerides: 70  (11/06/2008)    SGOT (AST): 14  (07/23/2009)   SGPT (ALT): <8 U/L  (07/23/2009) CMP ordered    Alkaline phosphatase: 95  (07/23/2009)   Total bilirubin: 0.7  (07/23/2009)    Lipid flowsheet reviewed?: Yes   Progress toward LDL goal: At goal  Hypertension   Last Blood Pressure: 96 / 64  (09/02/2009)   Serum creatinine: 1.42  (07/23/2009)   Serum potassium 4.2  (07/23/2009) CMP ordered     Hypertension flowsheet reviewed?: Yes   Progress toward BP goal: At goal  Self-Management Support :   Personal Goals (by the next clinic visit) :      Personal blood pressure goal: 140/90  (12/01/2008)     Personal LDL goal: 100  (12/01/2008)    Hypertension self-management support: Written self-care plan, Education handout  (12/01/2008)    Hypertension self-management support not done because: Good outcomes  (09/02/2009)    Lipid self-management support: Not documented     Lipid self-management support not done because: Good outcomes  (09/02/2009)   Appended Document: CKD & Anemia     Clinical Lists Changes  Problems: Changed problem  from RENAL INSUFFICIENCY (ICD-588.9) to CHRONIC KIDNEY DISEASE STAGE III (MODERATE) (ICD-585.3) Removed problem of BAKER'S CYST, LEFT KNEE (ICD-727.51) Removed problem of MENISCUS TEAR (ICD-836.2) Changed problem from OBESITY, NOS (ICD-278.00) to OVERWEIGHT (ICD-278.02) Added new problem of ANEMIA, NORMOCYTIC (ICD-285.9) Assessed ANEMIA, NORMOCYTIC as new - Hemoglobin down from 07/23/09.  Doubt part of her dyspnea. Will investigate with anemia panel, retic count, haptoglobin, LDH, and PBS  next OV in 2 weeks. Assessed CHRONIC KIDNEY DISEASE STAGE III (MODERATE) as comment only - GFR 33 ml/min. Stable since 07/23/09. Not on ACEI or ARB.  Solitary kidney. No UTI symptoms.  Will check to see if taking NSAIDS or COX2 inhibitors. Stop HCTZ.  Assessed COPD as comment only -  Need to stop Combivent on next OV as Tammy Chima is already on Spiriva.  She got the Combivent from providers who she saw recently for her lower respiratory tract infection.   She will continue the Ventolin as needed. Her updated medication list for this problem includes:    Advair Diskus 250-50 Mcg/dose Misc (Fluticasone-salmeterol) ..... Inhale 1 puff as directed twice a day    Spiriva Handihaler 18 Mcg Caps (Tiotropium bromide monohydrate) ..... Inhale one powder of one capsule once daily.    Ventolin Hfa 108 (90 Base) Mcg/act Aers (Albuterol sulfate) .Marland Kitchen... 2 sprays inhaled four times a day, if needed for shortness of breath. disp:1 mdi. refill: 5.    Combivent 18-103 Mcg/act Aero (Ipratropium-albuterol) .Marland Kitchen... 2 puffs every 4 hours with spacer.        Impression & Recommendations:  Problem # 1:  ANEMIA, NORMOCYTIC (ICD-285.9) Assessment New Hemoglobin down from 07/23/09.  Doubt part of her dyspnea. Will investigate with anemia panel, retic count, haptoglobin, LDH, and PBS  next OV in 2 weeks.  Problem # 2:  CHRONIC KIDNEY DISEASE STAGE III (MODERATE) (ICD-585.3) GFR 33 ml/min. Stable since 07/23/09. Not on ACEI or ARB.   Solitary kidney. No UTI symptoms.  Will check to see if taking NSAIDS or COX2 inhibitors. Stop HCTZ.   Problem # 3:  COPD (ICD-496) Need to stop Combivent on next OV as [name] is already on Spiriva.  She got the Combivent from providers who she saw recently for her lower respiratory tract infection.   She will continue the Ventolin as needed. Her updated medication list for this problem includes:    Advair Diskus 250-50 Mcg/dose Misc (Fluticasone-salmeterol) ..... Inhale 1 puff as directed twice a day    Spiriva Handihaler 18 Mcg Caps (Tiotropium bromide monohydrate) ..... Inhale one powder of one capsule once daily.    Ventolin Hfa 108 (90 Base) Mcg/act Aers (Albuterol sulfate) .Marland Kitchen... 2 sprays inhaled four times a day, if needed for shortness of breath. disp:1 mdi. refill: 5.    Combivent 18-103 Mcg/act Aero (Ipratropium-albuterol) .Marland Kitchen... 2 puffs every 4 hours with spacer.  Complete Medication List: 1)  Advair Diskus 250-50 Mcg/dose Misc (Fluticasone-salmeterol) .... Inhale 1 puff as directed twice a day 2)  Aspirin 325 Mg Tabs (Aspirin) .Marland Kitchen.. 1 tablet by mouth daily 3)  Ativan 0.5 Mg Tabs (Lorazepam) .... Take 1to 2  tablets by mouth every eight hours, as needed for anxiety or shortness of breath 4)  Tums E-x 750 Mg Chew (Calcium carbonate antacid) .... Take 1 tablet twice a day with meals 5)  Nitrostat 0.4 Mg Subl (Nitroglycerin) .Marland Kitchen.. 1 tablet under your tongue every five minutes as needed for chest pain. disp:25 tab 6)  Spiriva Handihaler 18 Mcg Caps (Tiotropium bromide monohydrate) .... Inhale one powder of one capsule once daily. 7)  Ventolin Hfa 108 (90 Base) Mcg/act Aers (Albuterol sulfate) .... 2 sprays inhaled four times a day, if needed for shortness of breath. disp:1 mdi. refill: 5. 8)  Omeprazole 40 Mg Cpdr (Omeprazole) .... One tablet by mouth once a day 9)  Simvastatin 40 Mg Tabs (Simvastatin) .Marland Kitchen.. 1 tablet by mouth once a day at bedtime 10)  Multivitamins Tabs (Multiple vitamin)  .Marland Kitchen.. 1 tablet by mouth daily 11)  Tramadol Hcl 50 Mg Tabs (Tramadol hcl) .Marland Kitchen.. 1 to 2  tablets four times a day as needed for pain 12)  Combivent 18-103 Mcg/act Aero (Ipratropium-albuterol) .... 2 puffs every 4 hours with spacer. 13)  Metoprolol Tartrate 50  Mg Tabs (Metoprolol tartrate) .... Half tablet by mouth twice a day

## 2010-03-30 NOTE — Op Note (Signed)
Summary: Operative Report: Left Total Knee Arthroplasty    NAME:  [name], [name]                   ACCOUNT NO.:  [id number]      MEDICAL RECORD NO.:  [id number]          PATIENT TYPE:  INP      LOCATION:  0011                         [facility]ACILITY[facility]  [facility]      PHYSICIAN:  [name], M.D.    DATE OF BIRTH:  1923/05/29      DATE OF PROCEDURE:  06/22/2009   DATE OF DISCHARGE:                                  OPERATIVE REPORT      PREOPERATIVE DIAGNOSIS:  Osteoarthritis, left knee.      POSTOPERATIVE DIAGNOSIS:  Osteoarthritis, left knee.      PROCEDURE:  Left total knee arthroplasty.      SURGEON:  Aluisio.      ASSISTANT:  Arlee Muslim, P.A.-C.      ANESTHESIA:  Spinal with Duramorph.      ESTIMATED BLOOD LOSS:  Minimal.      DRAIN:  None.      TOURNIQUET TIME:  24 minutes at 300 mmHg.      COMPLICATIONS:  None.      CONDITION:  Stable to recovery.      BRIEF CLINICAL NOTE:  [name] is an 77 year old female with end-stage   arthritis in the left knee with progressively worsening pain and   dysfunction.  She has failed nonoperative management and presents for a   total knee arthroplasty.  She has had a previously successful right   total knee.      PROCEDURE IN DETAIL:  After successful administration of spinal   anesthetic, a tourniquet was placed high on her left thigh and her left   lower extremity was prepped and draped in the usual sterile fashion.   Extremity was wrapped in Esmarch, knee flexed, tourniquet inflated to   300 mmHg.  Midline incision is made with a 10-blade through subcutaneous   tissue to the level of the extensor mechanism.  A fresh blade is used to   make a medial parapatellar arthrotomy.  Soft tissue on the proximal   medial tibia is subperiosteally elevated to the joint line with the   knife into the semimembranosus bursa with a Cobb elevator.  Soft tissue   laterally is elevated with attention being paid to avoiding the patellar   tendon on the  tibial tubercle.  The patella was subluxed laterally and   knee flexed 90 degrees and ACL and PCL removed.  Drill was used to   create a starting hole in the distal femur and the canal was thoroughly   irrigated.  Five-degree left valgus alignment guide is placed and the   block is pinned to remove 11 mm off the distal femur.  I took 11 because   of preoperative flexion contracture.  Distal femoral resection was made   with an oscillating saw.      The tibia was subluxed forward and the menisci were removed.   Extramedullary tibial alignment guide was placed referencing proximally   at the medial aspect of the tibial tubercle and  distally along the 2nd   metatarsal axis and tibial crest.  Block was pinned to remove 2 mm off   the more deficient medial side.  Tibial resection was made with an   oscillating saw.  Size 2.5 was the most appropriate tibial component and   the proximal tibia prepared with a modular drill and keel punch for the   size 2.5.      The femoral sizing guide was placed, size 2.5 was most appropriate on   the femur.  Rotation was marked at the epicondylar axis and confirmed by   creating a rectangular flexion gap at 90 degrees.  The 2.5 cutting block   was pinned in this rotation and the anterior, posterior, and chamfer   cuts made.  Intercondylar block was placed and that cut was made.  The   trial size 2.5 posterior stabilized femur was placed.  A 10-mm posterior   stabilized rotating platform insert trial was placed with 10 full   extensions achieved with excellent varus-valgus and anterior-posterior   balance throughout full range of motion.  Patella was everted and   thickness measured to be 22 mm.  Freehand resection was taken to 12 mm,   35 template was placed, lug holes were drilled, trial patella was   placed, and it tracks normally.  Osteophytes were removed from the   posterior femur with the trial in place.  All trials were removed and   the cut bone  surfaces were prepared with pulsatile lavage.  Cement was   mixed and once ready for implantation a size 2.5 mobile bearing tibial   tray, 2.5 posterior stabilized femur, and 35 patella were cemented into   place and the patella was held with the clamp.  Trial 10-mm inserts   placed, knee held with full extension, and all extruded cement removed.   When the cement had fully hardened, then the permanent 10-mm posterior   stabilized rotating platform insert was placed into the tibial tray.   The wound was copiously irrigated with saline solution.  FloSeal was   injected into the medial and lateral gutters and suprapatellar area.   Moist sponge was placed and tourniquet released for a total time of 24   minutes.  Sponges held for 2 minutes and removed.  Minimal bleeding was   encountered.  A bleeding that was encountered was stopped with   electrocautery.  Wound was further irrigated and the arthrotomy closed   with interrupted #1-PDS.  Flexion against gravity was 140 degrees and   the patella tracked normally.  Subcu was closed with interrupted 2-0   Vicryl and subcuticular running 4-0 Monocryl.  Catheter for the Marcaine   pain pump was placed and the pump was initiated.  Steri-Strips and a   bulky sterile dressing were applied and she was placed into a knee   immobilizer, awakened, and transported to recovery in stable condition.               [name], M.D.  Appended Document: Operative Report: Left Total Knee Arthroplasty   Past Surgical History:    Bone Scan-Whole Body: L5 face degeneration , bilateral knee joint degeneration - 01/30/2003,     Cardiolite: No evidence ischemia, EF68% - 09/29/2002,         Cholecystectomy    H/O Ruptured Appendix          Nephrectomy for Transitional Cell Cancer Dr. Leander Rams -, 05/24/2001. Surveillance Cystoscopy was WNL by Dr. Alfonso Patten.  Rosana Hoes - 06/11/2001        Right Knee Replacement (Dr Wynelle Link) 2009    Left Knee Arthroplasty (Dr Maureen Ralphs) 05/2009                          Pacemaker lead extraction & reinsertion - 07/25/2003

## 2010-03-30 NOTE — Consult Note (Signed)
Summary: [facility]   Imported By: Beryle Lathe 10/14/2009 17:11:25  _____________________________________________________________________  External Attachment:    Type:   Image     Comment:   External Document

## 2010-03-30 NOTE — Assessment & Plan Note (Signed)
Summary: pneumonia,tcb   Vital Signs:  Patient profile:   77 year old female Height:      63.5 inches Weight:      161 pounds BMI:     28.17 BSA:     1.78 O2 Sat:      95 % on Room air Temp:     97.5 degrees F Pulse rate:   91 / minute BP sitting:   106 / 74  Vitals Entered By: Christen Bame CMA (August 20, 2009 11:17 AM)  O2 Flow:  Room air CC: ? Pneumonia Is Patient Diabetic? No Pain Assessment Patient in pain? no        Primary Care Provider:  TODD MCDIARMID MD  CC:  ? Pneumonia.  History of Present Illness: ? pneumonia: patient reportedly has a history of easily getting PNA in the past.  she reports that approximately a few weeks ago she developed shorteness of breath, palpitations.  tried her lorazepam without relief so saw Dr [name] or a PA at another clinic who she reports did an EKG which was okay (she has known Afib history) and told her that her lungs sounded fine.  She was not prescribed any meds. Then a few days later she developed a tickle in her throat and cough.  she has a Zpack at home which she took 3 days after this began given her h/o PNA.  this didn't seem like it was helping and she felt weaker and weaker so she called her primary pulmonologist at [facility] (Dr Casper Harrison) who suggested she should go to [facility] or PCP.  she went to [facility] where she was see by Dr Kennon Holter who did cxr - shows possible PNA and she was changed to avelox.  in addition to the above 7 days ago she was rx'd predninsone which she just finished today.  since starting the Avelox on 08/18/09 she has felt slightly better but still has significant shortness of breath, weakness and occasional dizziness.  she reports that her fevers have broken (they have been as high as 102 for 2 days).  she has been taking her spiriva daily, advair two times a day and ventolin up to 4x daily with spacer.   Habits & Providers  Alcohol-Tobacco-Diet     Tobacco Status: never  Current Medications (verified): 1)   Advair Diskus 250-50 Mcg/dose Misc (Fluticasone-Salmeterol) .... Inhale 1 Puff As Directed Twice A Day 2)  Aspirin 325 Mg Tabs (Aspirin) .Marland Kitchen.. 1 Tablet By Mouth Daily 3)  Ativan 0.5 Mg Tabs (Lorazepam) .... Take 1to 2  Tablets By Mouth Every Eight Hours, As Needed For Anxiety or Shortness of Breath 4)  Tums E-X 750 Mg Chew (Calcium Carbonate Antacid) .... Take 1 Tablet Twice A Day With Meals 5)  Nitrostat 0.4 Mg  Subl (Nitroglycerin) .Marland Kitchen.. 1 Tablet Under Your Tongue Every Five Minutes As Needed For Chest Pain. Disp:25 Tab 6)  Spiriva Handihaler 18 Mcg Caps (Tiotropium Bromide Monohydrate) .... Inhale One Powder of One Capsule Once Daily. 7)  Ventolin Hfa 108 (90 Base) Mcg/act Aers (Albuterol Sulfate) .... 2 Sprays Inhaled Four Times A Day, If Needed For Shortness of Breath. Disp:1 Mdi. Refill: 5. 8)  Omeprazole 40 Mg Cpdr (Omeprazole) .... One Tablet By Mouth Once A Day 9)  Simvastatin 40 Mg Tabs (Simvastatin) .Marland Kitchen.. 1 Tablet By Mouth Once A Day At Bedtime 10)  Multivitamins   Tabs (Multiple Vitamin) .Marland Kitchen.. 1 Tablet By Mouth Daily 11)  Metoprolol-Hydrochlorothiazide 50-25 Mg Tabs (Metoprolol-Hydrochlorothiazide) .... Half  Tablet By Mouth Twice A Day 12)  Tramadol Hcl 50 Mg Tabs (Tramadol Hcl) .Marland Kitchen.. 1 To 2  Tablets Four Times A Day As Needed For Pain 13)  Prednisone 50 Mg Tabs (Prednisone) .Marland Kitchen.. 1 By Mouth Once Daily For 5 Days 14)  Combivent 18-103 Mcg/act Aero (Ipratropium-Albuterol) .... 2 Puffs Every 4 Hours With Spacer. 15)  Avelox 400 Mg Tabs (Moxifloxacin Hcl) .Marland Kitchen.. 1 Daily For 7 Days. (Started 08/18/09 By [facility])  Allergies (verified): 1)  Codeine Phosphate (Codeine Phosphate) 2)  * Cox 2 Inhibitors 3)  Singulair (Montelukast Sodium) 4)  * Sotalol  Past History:  Past medical, surgical, family and social histories (including risk factors) reviewed for relevance to current acute and chronic problems.  Past Medical History: Reviewed history from 11/10/2008 and no changes required. Asthmatic  Bronchitis, Chronic, COPD, Stage 3 (05/06), PFT(09/06) at [facility] (Dr. Casper Harrison, [place]):  FEV1 61%; FVC 77%, nl diffusion capacity c/w moderate COPD.  Post-Bronchodilator FEV1 increased 19%    Hosp`n 05/07 - Shingles Chest Pain, Echo-Nl EF, Nl LV, No wall motion abnl -07/25/2003, ETT 11/98; poor aerobic fitness ETT 11/98; adeq, low prob of obstructive CAD - 08/28/2001  Pacemaker placement, dual chamber placed 03/05/2003 (Dr Lovena Le) Hospitalized for NSTEMI ( 10/02/2008) PCI (10/02/2008, Dr Daneen Schick): S/P NSTEMI,  RCA [place] placed: 99% lesion to 0% lesion. Circumflex Obtuse marginal with 90% stenosis (left for medical mangement for now)  Hospitalization (9/8-9/10, Dr Daneen Schick, III, Cardiology) for Paroxysmal Atrial Fibrillation with RVR and anginal pain secondary to demand/supply mismatch in setting of RVR with known circumflex artery branch disease.    Left adrenal mass by 6/99 CT, stable for many years - incidentaloma, Adrenal adenoma, left, stable on CT 2007  Only one Kidney - surgery transitional cell cancer, ONLY ONE KIDNEY!!!,   Surveillance colonoscopy 2005 by Dr. Cristina Gong found colonic adenomatous polyps Hernia, umbilical  Central HNP w/ L4&5 root encroachment - 05/24/2001,   Spinal Stenosis: L-S MRI: L4-5 Spinal stenosis, - 03/03/2006, Spondylolithesis L5-S1(mild), DJD L4-5 on X-Ray 11/04 R. Hip Xray Unremarkable - 04/19/2001 Right heel spur 11/04  Has medication nebulizer device at home Vocal Polp excised by Dr. Purcell Nails (ENT) "many years ago"  Past Surgical History: Reviewed history from 07/22/2009 and no changes required. Bone Scan-Whole Body: L5 face degeneration , bilateral knee joint degeneration - 01/30/2003,  Cardiolite: No evidence ischemia, EF68% - 09/29/2002,   Cholecystectomy H/O Ruptured Appendix    Nephrectomy for Transitional Cell Cancer Dr. Leander Rams -, 05/24/2001. Surveillance Cystoscopy was WNL by Dr. Leander Rams - 06/11/2001  Right Knee Replacement (Dr  Wynelle Link) 2009 Left Knee Arthroplasty (Dr Maureen Ralphs) 05/2009       Pacemaker lead extraction & reinsertion - 07/25/2003    Family History: Reviewed history from 06/18/2007 and no changes required. Father with MI age<55 yrs. Mother died age 59 years-old. Mother with Dementia of Alzheimers' Type.  Social History: Reviewed history from 05/11/2009 and no changes required. 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   Pt has home nebulizer for inhalation therapies. Former Smoker Smoking Status:  never  Review of Systems       per HPI.   Physical Exam  General:  alert. cooperative. groomed. speaking in full sentences. No apparent distress.   VS noted (O2 95% on RA)  Head:  normocephalic and atraumatic.   Ears:  no external deformities.  Nose:  External nasal examination shows no deformity or inflammation. Nasal mucosa are pink and moist without lesions or exudates. Mouth:  Oral mucosa and oropharynx without lesions or exudates.  Teeth in good repair. Lungs:  normal respiratory effort, no accessory muscle use diffuse expiratory rhonchi and rare wheeze.  no rales Heart:  regular rhythm, no murmur, and no gallop.   Extremities:  trace peripheral edema  Neurologic:  alert & oriented X3.     Impression & Recommendations:  Problem # 1:  PNEUMONIA (ICD-486) Assessment New  CXR consistent with ? of early  pna.  h/o severe COPD so will treat as such with frequent combivent, steroids, continue avelox for abx.  return if worsens.   if worsens consider hospitalization doesn't appear to be cardiac at this time with no chest pains, only trace edema in legs (which is chronic)  Her updated medication list for this problem includes:    Avelox 400 Mg Tabs (Moxifloxacin hcl) .Marland Kitchen... 1 daily for 7 days. (started 08/18/09 by [facility])  Orders: [facility]- Est  Level 4  [id number])  Complete Medication List: 1)  Advair Diskus 250-50 Mcg/dose Misc (Fluticasone-salmeterol) .... Inhale 1 puff as directed twice a day 2)  Aspirin 325 Mg Tabs (Aspirin) .Marland Kitchen.. 1 tablet by mouth daily 3)  Ativan 0.5 Mg Tabs (Lorazepam) .... Take 1to 2  tablets by mouth every eight hours, as needed for anxiety or shortness of breath 4)  Tums E-x 750 Mg Chew (Calcium carbonate antacid) .... Take 1 tablet twice a day with meals 5)  Nitrostat 0.4 Mg Subl (Nitroglycerin) .Marland Kitchen.. 1 tablet under your tongue every five minutes as needed for chest pain. disp:25 tab 6)  Spiriva Handihaler 18 Mcg Caps (Tiotropium bromide monohydrate) .... Inhale one powder of one capsule once daily. 7)  Ventolin Hfa 108 (90 Base) Mcg/act Aers (Albuterol sulfate) .... 2 sprays inhaled four times a day, if needed for shortness of breath. disp:1 mdi. refill: 5. 8)  Omeprazole 40 Mg Cpdr (Omeprazole) .... One tablet by mouth once a day 9)  Simvastatin 40 Mg Tabs (Simvastatin) .Marland Kitchen.. 1 tablet by mouth once a day at bedtime 10)  Multivitamins Tabs (Multiple vitamin) .Marland Kitchen.. 1 tablet by mouth daily 11)  Metoprolol-hydrochlorothiazide 50-25 Mg Tabs (Metoprolol-hydrochlorothiazide) .... Half tablet by mouth twice a day 12)  Tramadol Hcl 50 Mg Tabs (Tramadol hcl) .Marland Kitchen.. 1 to 2  tablets four times a day as needed for pain 13)  Prednisone 50 Mg Tabs (Prednisone) .Marland Kitchen.. 1 by mouth once daily for 5 days 14)  Combivent 18-103 Mcg/act Aero (Ipratropium-albuterol) .... 2 puffs every 4 hours with spacer. 15)  Avelox 400 Mg Tabs (Moxifloxacin hcl) .Marland Kitchen.. 1 daily for 7 days. (started 08/18/09 by [facility])  Patient Instructions: 1)  Please follow up Monday for your breathing. 2)  If your breathing gets worse or you get a fever you need to be seen right away.   3)  REsume the new prednisone prescription 4)  Stop your ventolin and Spiriva and instead until your follow up use the new Combivent inhaler with the spacer you have every 4 hours.  5)  Continue your  Avelox Prescriptions: COMBIVENT 18-103 MCG/ACT AERO (IPRATROPIUM-ALBUTEROL) 2 puffs every 4 hours with spacer.  #1 x 1   Entered and Authorized by:   Patria Mane  MD   Signed by:   Patria Mane  MD on 08/20/2009   Method used:   Electronically to        [facility] [id number]* (  retail)       [address]       [place], [place]  [address]       Ph: [phone] or [phone]       Fax: [phone]   RxID:   [id number] PREDNISONE 50 MG TABS (PREDNISONE) 1 by mouth once daily for 5 days  #5 x 0   Entered and Authorized by:   Patria Mane  MD   Signed by:   Patria Mane  MD on 08/20/2009   Method used:   Electronically to        [facility] [address]* (retail)       [address]       [place], [place]  [address]       Ph: [phone] or [phone]       Fax: [phone]   RxID:   [id number]

## 2010-03-30 NOTE — Progress Notes (Signed)
Summary: Report CBC and CMET to patient   Phone Note Outgoing Call   Call placed by: Sherren Mocha McDiarmid MD,  Jul 24, 2009 10:57 AM Call placed to: Patient Summary of Call: Informed patient of improved hemoglobin and serum creatinine since her surgery.   No further scheduled monitoring needed. Initial call taken by: Sherren Mocha McDiarmid MD,  Jul 24, 2009 10:58 AM

## 2010-03-30 NOTE — Consult Note (Signed)
Summary: [facility]   Imported By: [name] 04/10/2009 10:58:22  _____________________________________________________________________  External Attachment:    Type:   Image     Comment:   External Document

## 2010-03-30 NOTE — Progress Notes (Signed)
Summary: LLL pneumonia (new)  Medications Added AVELOX 400 MG TABS (MOXIFLOXACIN HCL) 1 tablet daily for 10 days to treat pneumonia DUONEB 0.5-2.5 (3) MG/3ML SOLN (IPRATROPIUM-ALBUTEROL) Inhale one unit dose vial four times a day.  Disp: one month supply.       Phone Note Outgoing Call Call back at [facility] Phone [phone]   Call placed by: Sherren Mocha Koi Yarbro MD,  September 23, 2009 11:07 AM Call placed to: Patient Summary of Call: Patient having increased cough that is now productive of non-purulent sputum.  Cough interfered with sleep last nite.   Patient talking in full sentences.  Not in respiratory distress. [name] Truman Hayward is at home with her currently.  Patient using ventolin.  She is unable to locate her nebulizer machine.   She has Combivent MDI available.  discussed CXR (09/22/09) finding of LLL infiltrate and resolution of RUL infiltrate from 08/18/09 CXR.   Plan: Start Avelox 400 mg daily for 10 days.  Will ask [facility] to send out Nebulizer device to home along with a month supply of Duoneb unit dose vial, inhale one vial QID.  Patient may use Combivent MDI 2 puffs QID while awaiting delivery of Duoneb and Nebulizer. Will contact patient tomorrow to see how she is doing.  Initial call taken by: Sherren Mocha Maurizio Geno MD,  September 23, 2009 11:14 AM  New Problems: BACTERIAL PNEUMONIA (ICD-482.9)   New Problems: BACTERIAL PNEUMONIA (ICD-482.9) New/Updated Medications: AVELOX 400 MG TABS (MOXIFLOXACIN HCL) 1 tablet daily for 10 days to treat pneumonia DUONEB 0.5-2.5 (3) MG/3ML SOLN (IPRATROPIUM-ALBUTEROL) Inhale one unit dose vial four times a day.  Disp: one month supply. Prescriptions: DUONEB 0.5-2.5 (3) MG/3ML SOLN (IPRATROPIUM-ALBUTEROL) Inhale one unit dose vial four times a day.  Disp: one month supply.  #QS x 0   Entered and Authorized by:   Sherren Mocha Adien Kimmel MD   Signed by:   Sherren Mocha Kathan Kirker MD on 09/23/2009   Method used:   Printed then faxed to ...       [facility] (retail)       [address]       [place], [place]  [address]       Ph: [phone]       Fax: [phone]   RxID:   [id number] AVELOX 400 MG TABS (MOXIFLOXACIN HCL) 1 tablet daily for 10 days to treat pneumonia  #10 x 0   Entered and Authorized by:   Sherren Mocha Jerritt Cardoza MD   Signed by:   Sherren Mocha Jerusalen Mateja MD on 09/23/2009   Method used:   Electronically to        [facility] (retail)       [address]       [place],   [address]       Ph: [phone] or [phone]       Fax: [phone]   RxID:   [id number]

## 2010-03-30 NOTE — Progress Notes (Signed)
Summary: phn msg   Phone Note Call from Patient Call back at Home Phone [phone]   Caller: Patient Summary of Call: pt wants to be worked in this week to be seen- just out of hospital - need permission for her to be seen by [facility] Initial call taken by: Audie Clear,  Jul 21, 2009 4:25 PM  Follow-up for Phone Call        Please schedule Ms Kamstra at noon tomorrow to see Kamau Weatherall.   Follow-up by: Sherren Mocha Emberlynn Riggan MD,  Jul 22, 2009 8:45 AM

## 2010-03-30 NOTE — Assessment & Plan Note (Signed)
Summary: [facility]   Vital Signs:  Patient profile:   77 year old female Height:      63.5 inches Weight:      168.2 pounds BMI:     29.43 O2 Sat:      96 % on Room air Temp:     97.7 degrees F oral Pulse rate:   104 / minute Pulse rhythm:   regular BP sitting:   124 / 67  (left arm) Cuff size:   regular  Vitals Entered By: Levert Feinstein LPN (May 26, 624THL 075-GRM AM)  O2 Flow:  Room air CC: f/u hospitalization Is Patient Diabetic? No Pain Assessment Patient in pain? no        Primary Care Provider:  Masayoshi Couzens MD  CC:  f/u hospitalization.  History of Present Illness: Left total knee replacement surgery on 06/22/09. Discharge summary reviewed.  No major complication. did develop anemia and recievied two units PRBC. Also, serum creatinine rose to 2.3 mg/dL inhouse. Mrs Lomeli was in [facility] for rehab until about 2 weeks ago when she returned to living at home and receiving home health physical therapy.  Her PT is to end at end of this week. She is to see Dr Maureen Ralphs next week for her post-op check of her left knee.  Dyspnea She has felt more short of breath with exertion since returning home. No SOB at rest. She is able to perform her ADLs and iADLs. Able to climb her home flight of stairs to second floor with no more difficulty than prior to April surgery. Patient was on Warfarin post-op until May 15 per Discharge Summary.  No SOB at night.  No orthopnea.  No cough. No wheezing. No fever. No leg swelling or worsening of leg pain. No chest pain. No palpitations. Using her Advair and Spiriva.  Not using rescue albuterol.  Habits & Providers  Alcohol-Tobacco-Diet     Alcohol drinks/day: <1     Tobacco Status: quit > 6 months  Current Medications (verified): 1)  Advair Diskus 250-50 Mcg/dose Misc (Fluticasone-Salmeterol) .... Inhale 1 Puff As Directed Twice A Day 2)  Aspirin 325 Mg Tabs (Aspirin) .Marland Kitchen.. 1 Tablet By Mouth Daily 3)  Ativan 0.5 Mg Tabs (Lorazepam) .... Take 1to 2   Tablets By Mouth Every Eight Hours, As Needed For Anxiety or Shortness of Breath 4)  Tums E-X 750 Mg Chew (Calcium Carbonate Antacid) .... Take 1 Tablet Twice A Day With Meals 5)  Nitrostat 0.4 Mg  Subl (Nitroglycerin) .Marland Kitchen.. 1 Tablet Under Your Tongue Every Five Minutes As Needed For Chest Pain. Disp:25 Tab 6)  Spiriva Handihaler 18 Mcg Caps (Tiotropium Bromide Monohydrate) .... Inhale One Powder of One Capsule Once Daily. 7)  Ventolin Hfa 108 (90 Base) Mcg/act Aers (Albuterol Sulfate) .... 2 Sprays Inhaled Four Times A Day, If Needed For Shortness of Breath. Disp:1 Mdi. Refill: 5. 8)  Omeprazole 40 Mg Cpdr (Omeprazole) .... One Tablet By Mouth Once A Day 9)  Simvastatin 40 Mg Tabs (Simvastatin) .Marland Kitchen.. 1 Tablet By Mouth Once A Day At Bedtime 10)  Multivitamins   Tabs (Multiple Vitamin) .Marland Kitchen.. 1 Tablet By Mouth Daily 11)  Metoprolol-Hydrochlorothiazide 50-25 Mg Tabs (Metoprolol-Hydrochlorothiazide) .... Half Tablet By Mouth Twice A Day 12)  Tramadol Hcl 50 Mg Tabs (Tramadol Hcl) .Marland Kitchen.. 1 To 2  Tablets Four Times A Day As Needed For Pain  Allergies (verified): 1)  Codeine Phosphate (Codeine Phosphate) 2)  * Cox 2 Inhibitors 3)  Singulair (Montelukast Sodium) 4)  *  Sotalol PMH reviewed for relevance, PSH reviewed for relevance  Social History: Smoking Status:  quit > 6 months  Physical Exam  General:  alert. cooperative. groomed. speaking in full sentences. No apparent distress.   Eyes:  no conjunctival pallor Lungs:  SaO2 on Room air with ambulating 100 ft was 100%. normal respiratory effort, no accessory muscle use, normal breath sounds, no crackles, and no wheezes.   Heart:  regular rhythm, no murmur, and no gallop.   Msk:  Well healing left knee arthroplaty scar. Nontender left knee.  Able to rise from chair without assistance. Using one-point cane for balance during walking. No ataxia. Extremities:  No peripheral edema. Right calf 34.5 cm Left calf 35.5 cm  Skin:  No pallor Psych:   normally interactive, good eye contact, not anxious appearing, and not depressed appearing.     Impression & Recommendations:  Problem # 1:  DYSPNEA ON EXERTION (ICD-786.09)  No obvious cardiac or pulmonary process on exam.  Good oxygenation at rest and with exertion. No physical exam findings concerning for venous thromboembolic event.  Will stop flexeril and Ambien for now.  Asked her to take her metoprolol prior to her next office visit with me so I can assess her heart rate control on her home beta-blocker dose.  Will check hemoglobin.   Monitor progress. No new interventions at this time.  Encourage Belen to follow up with Dr Pernell Dupre (Cardiology). Asked her  The following medications were removed from the medication list:    Lasix 40 Mg Tabs (Furosemide) .Marland Kitchen... Take 1 tablet by mouth once a day as needed for leg swelling Her updated medication list for this problem includes:    Advair Diskus 250-50 Mcg/dose Misc (Fluticasone-salmeterol) ..... Inhale 1 puff as directed twice a day    Spiriva Handihaler 18 Mcg Caps (Tiotropium bromide monohydrate) ..... Inhale one powder of one capsule once daily.    Ventolin Hfa 108 (90 Base) Mcg/act Aers (Albuterol sulfate) .Marland Kitchen... 2 sprays inhaled four times a day, if needed for shortness of breath. disp:1 mdi. refill: 5.    Metoprolol-hydrochlorothiazide 50-25 Mg Tabs (Metoprolol-hydrochlorothiazide) ..... Half tablet by mouth twice a day  Orders: [place]- Est  Level 4 [id number])  Problem # 2:  Screening Breast Cancer (ICD-V76.10) Assessment: Comment Only Reminded Mrs Czarniak to schedule her screening mammogram with [facility].  She said she would make the appointment.   Complete Medication List: 1)  Advair Diskus 250-50 Mcg/dose Misc (Fluticasone-salmeterol) .... Inhale 1 puff as directed twice a day 2)  Aspirin 325 Mg Tabs (Aspirin) .Marland Kitchen.. 1 tablet by mouth daily 3)  Ativan 0.5 Mg Tabs (Lorazepam) .... Take 1to 2  tablets by mouth every eight hours,  as needed for anxiety or shortness of breath 4)  Tums E-x 750 Mg Chew (Calcium carbonate antacid) .... Take 1 tablet twice a day with meals 5)  Nitrostat 0.4 Mg Subl (Nitroglycerin) .Marland Kitchen.. 1 tablet under your tongue every five minutes as needed for chest pain. disp:25 tab 6)  Spiriva Handihaler 18 Mcg Caps (Tiotropium bromide monohydrate) .... Inhale one powder of one capsule once daily. 7)  Ventolin Hfa 108 (90 Base) Mcg/act Aers (Albuterol sulfate) .... 2 sprays inhaled four times a day, if needed for shortness of breath. disp:1 mdi. refill: 5. 8)  Omeprazole 40 Mg Cpdr (Omeprazole) .... One tablet by mouth once a day 9)  Simvastatin 40 Mg Tabs (Simvastatin) .Marland Kitchen.. 1 tablet by mouth once a day at bedtime 10)  Multivitamins Tabs (  Multiple vitamin) .Marland Kitchen.. 1 tablet by mouth daily 11)  Metoprolol-hydrochlorothiazide 50-25 Mg Tabs (Metoprolol-hydrochlorothiazide) .... Half tablet by mouth twice a day 12)  Tramadol Hcl 50 Mg Tabs (Tramadol hcl) .Marland Kitchen.. 1 to 2  tablets four times a day as needed for pain  Other Orders: [facility] [id number]) Comp [facility] [id number])  Patient Instructions: 1)  Please schedule a follow-up appointment in 1 to 2 weeks.  May double book.   2)  Stop the Cyclobenzaprine (Flexeril) 3)  Stop the Zolpidem (Ambien) 4)  Next office visit with Dr Genesis Novosad, take your scheduled medications.

## 2010-03-30 NOTE — Assessment & Plan Note (Signed)
Summary: f/u,df   Vital Signs:  Patient profile:   77 year old female Height:      64 inches Weight:      171.8 pounds BMI:     29.60 O2 Sat:      96 % on Room air Temp:     98.0 degrees F oral Pulse rate:   85 / minute Pulse (ortho):   85 / minute BP sitting:   156 / 89  (left arm) BP standing:   141 / 84 Cuff size:   regular  Vitals Entered By: Levert Feinstein LPN (August 15, 624THL 4:38 PM)  O2 Flow:  Room air  Serial Vital Signs/Assessments:  Time      Position  BP       Pulse  Resp  Temp     By           Lying LA  156/96   101                   Todd McDiarmid MD           Sitting   156/89   85                    Sherren Mocha McDiarmid MD           Standing  141/84   55                    Sherren Mocha McDiarmid MD  CC: f/u Is Patient Diabetic? No Pain Assessment Patient in pain? yes     Location: knees   Primary Care Provider:  TODD MCDIARMID MD  CC:  f/u.  History of Present Illness: HYPERTENSION Disease Monitoring   Blood pressure range:130-140/70-80 at home   Chest pain:none   Dyspnea:none   Claudication:none  Medications   Compliance:taking HCTZ 12.5 mg every other day, and taking metoprolol 50 mg half tablet twice a day Side effects   Lightheadedness:no   Urinary frequency:no   Edema: Had bilateral ankle edema after trip to beach where she ate salty foods.  Edema responded to one dose of Lasix 40mg  by mouth upon returning home.    Prevention   Exercise:No formal exercise.  Remaining socially active.    Diet pattern: Watching fats in diet.    Salt restriction: Had a recognize salt-excess diet last weekend.  Back on watching her salt intake.    Pneumonia CXR (09/22/09) finding of LLL infiltrate and resolution of RUL infiltrate from 08/18/09 CXR. Treated with Avelox 400 mg daily for 10 days and Duoneb via nebulizer QID as needed with good results. Cough, malaise, shortness of breath resolved to baseline.  At baseline sputum production currently.  No purulence.  Able  to go on beach vacation last week and go out on town during day without difficulty.  Habits & Providers  Alcohol-Tobacco-Diet     Alcohol drinks/day: <1     Tobacco Status: quit < 6 months  Current Problems (verified): 1)  Vitamin B12 Deficiency  (ICD-266.2) 2)  Peripheral Edema  (ICD-782.3) 3)  Ear Pain, Right  (ICD-388.70) 4)  Hypotension  (ICD-458.9) 5)  Myocardial Infarction, Hx of  (ICD-412) 6)  Hypertension, Benign Essential  (ICD-401.1) 7)  Ventricular Hypertrophy, Left  (ICD-429.3) 8)  Atrial Fibrillation  (ICD-427.31) 9)  Sick Sinus Syndrome  (ICD-427.81) 10)  Edema-legs,due To Venous Obstruct.  (ICD-459.2) 11)  Chronic Kidney Disease Stage Iii (MODERATE)  (ICD-585.3) 12)  Prediabetes  (ICD-790.29) 13)  Anemia, Normocytic  (0000000) 14)  Metabolic Syndrome X  (A999333) 15)  Hyperlipidemia  (ICD-272.4) 16)  COPD  (ICD-496) 17)  Personal History Pneumonia Recurrent  (ICD-V12.61) 18)  Anxiety  (ICD-300.00) 19)  Rhinitis, Allergic  (ICD-477.9) 20)  Overweight  (ICD-278.02) 21)  Osteoarthritis, Lower Leg  (ICD-715.96) 22)  Osteoarthritis of Spine, Nos  (ICD-721.90) 23)  Hernia, Hiatal, Noncongenital  (ICD-553.3) 24)  Lumbar Spinal Stenosis  (ICD-724.02) 25)  Disc With Radiculopathy  (ICD-722.71) 26)  Hx of Candidiasis of The Esophagus  (ICD-112.84) 27)  Sx of Abdominal Pain, Chronic  (ICD-789.00) 28)  Hemorrhoids, Nos  (ICD-455.6) 29)  Gastroesophageal Reflux, No Esophagitis  (ICD-530.81) 30)  Schatzki's Ring, Hx of  (ICD-V12.79) 31)  Adenomatous Colonic Polyp  (ICD-211.3) 32)  Follow-up, High Risk Treatment Nec  (ICD-V67.51) 33)  Encounter For Long-term Use of Other Medications  (ICD-V58.69)  Current Medications (verified): 1)  Advair Diskus 250-50 Mcg/dose Misc (Fluticasone-Salmeterol) .... Inhale 1 Puff As Directed Twice A Day 2)  Aspirin 325 Mg Tabs (Aspirin) .Marland Kitchen.. 1 Tablet By Mouth Daily 3)  Ativan 0.5 Mg Tabs (Lorazepam) .... Take 1to 2  Tablets By Mouth  Every Eight Hours, As Needed For Anxiety or Shortness of Breath 4)  Tums E-X 750 Mg Chew (Calcium Carbonate Antacid) .... Take 1 Tablet Twice A Day With Meals 5)  Nitrostat 0.4 Mg  Subl (Nitroglycerin) .Marland Kitchen.. 1 Tablet Under Your Tongue Every Five Minutes As Needed For Chest Pain. Disp:25 Tab 6)  Spiriva Handihaler 18 Mcg Caps (Tiotropium Bromide Monohydrate) .... Inhale One Powder of One Capsule Once Daily. 7)  Ventolin Hfa 108 (90 Base) Mcg/act Aers (Albuterol Sulfate) .... 2 Sprays Inhaled Four Times A Day, If Needed For Shortness of Breath. Disp:1 Mdi. Refill: 5. 8)  Omeprazole 40 Mg Cpdr (Omeprazole) .... One Tablet By Mouth Once A Day 9)  Simvastatin 40 Mg Tabs (Simvastatin) .Marland Kitchen.. 1 Tablet By Mouth Once A Day At Bedtime 10)  Multivitamins   Tabs (Multiple Vitamin) .Marland Kitchen.. 1 Tablet By Mouth Daily 11)  Tramadol Hcl 50 Mg Tabs (Tramadol Hcl) .Marland Kitchen.. 1 To 2  Tablets Four Times A Day As Needed For Pain 12)  Metoprolol Tartrate 50 Mg Tabs (Metoprolol Tartrate) .... Half Tablet By Mouth Twice A Day 13)  Duoneb 0.5-2.5 (3) Mg/15ml Soln (Ipratropium-Albuterol) .... Inhale One Unit Dose Vial Four Times A Day.  Disp: One Month Supply. 14)  Vitamin B-12 Cr 2000 Mcg Cr-Tabs (Cyanocobalamin) .Marland Kitchen.. 1 Tablet By Mouth Daily For B12 Deficiency  Allergies (verified): 1)  Codeine Phosphate (Codeine Phosphate) 2)  * Cox 2 Inhibitors 3)  Singulair (Montelukast Sodium) 4)  * Sotalol  Past History:  Past medical, surgical, family and social histories (including risk factors) reviewed for relevance to current acute and chronic problems.  Past Medical History: Reviewed history from 11/10/2008 and no changes required. Asthmatic Bronchitis, Chronic, COPD, Stage 3 (05/06), PFT(09/06) at [facility] (Dr. Casper Harrison, [place]):  FEV1 61%; FVC 77%, nl diffusion capacity c/w moderate COPD.  Post-Bronchodilator FEV1 increased 19%    Hosp`n 05/07 - Shingles Chest Pain, Echo-Nl EF, Nl LV, No wall motion abnl -07/25/2003, ETT 11/98;  poor aerobic fitness ETT 11/98; adeq, low prob of obstructive CAD - 08/28/2001  Pacemaker placement, dual chamber placed 03/05/2003 (Dr Lovena Le) Hospitalized for NSTEMI ( 10/02/2008) PCI (10/02/2008, Dr Daneen Schick): S/P NSTEMI,  RCA Dolores placed: 99% lesion to 0% lesion. Circumflex Obtuse marginal with 90% stenosis (left for medical mangement for now)  Hospitalization (9/8-9/10, Dr Daneen Schick,  III, Cardiology) for Paroxysmal Atrial Fibrillation with RVR and anginal pain secondary to demand/supply mismatch in setting of RVR with known circumflex artery branch disease.    Left adrenal mass by 6/99 CT, stable for many years - incidentaloma, Adrenal adenoma, left, stable on CT 2007  Only one Kidney - surgery transitional cell cancer, ONLY ONE KIDNEY!!!,   Surveillance colonoscopy 2005 by Dr. Cristina Gong found colonic adenomatous polyps Hernia, umbilical  Central HNP w/ L4&5 root encroachment - 05/24/2001,   Spinal Stenosis: L-S MRI: L4-5 Spinal stenosis, - 03/03/2006, Spondylolithesis L5-S1(mild), DJD L4-5 on X-Ray 11/04 R. Hip Xray Unremarkable - 04/19/2001 Right heel spur 11/04  Has medication nebulizer device at home Vocal Polp excised by Dr. Purcell Nails (ENT) "many years ago"  Past Surgical History: Reviewed history from 07/22/2009 and no changes required. Bone Scan-Whole Body: L5 face degeneration , bilateral knee joint degeneration - 01/30/2003,  Cardiolite: No evidence ischemia, EF68% - 09/29/2002,   Cholecystectomy H/O Ruptured Appendix    Nephrectomy for Transitional Cell Cancer Dr. Leander Rams -, 05/24/2001. Surveillance Cystoscopy was WNL by Dr. Leander Rams - 06/11/2001  Right Knee Replacement (Dr Wynelle Link) 2009 Left Knee Arthroplasty (Dr Maureen Ralphs) 05/2009       Pacemaker lead extraction & reinsertion - 07/25/2003    Family History: Reviewed history from 06/18/2007 and no changes required. Father with MI age<55 yrs. Mother died age 60 years-old. Mother with Dementia of Alzheimers'  Type.  Social History: Reviewed history from 05/11/2009 and no changes required. 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   Pt has home nebulizer for inhalation therapies. Former Smoker Smoking Status:  quit < 6 months  Review of Systems Resp:  Denies wheezing.  Physical Exam  General:  alert. cooperative. groomed. speaking in full sentences. No apparent distress.   VS noted - Sao2 96% on Room air Neck:  supple, no thyromegaly, no thyroid nodules or tenderness, and no JVD.   Lungs:  normal respiratory effort, no accessory muscle use, normal breath sounds, no crackles, and no wheezes.   Heart:  normal rate, regular rhythm, no murmur, and no gallop.   Extremities:  No peripheral edema.  Neurologic:  Normal gait Psych:  memory intact for recent and remote, normally interactive, good eye contact, not anxious appearing, and not depressed appearing.     Impression & Recommendations:  Problem # 1:  VITAMIN B12 DEFICIENCY (ICD-266.2) Assessment New  Borderline serum cobalamin level with elevated serum methylmalonic acid and elevated serum folate level. May be the origin of patient's mild normocytic anemia with wide RDW.   Recommend starting oral Vitamin B12 2000 mg daily. Will check Hgb on next OV.   Orders: [place]- Est Level  3 [id number])  Problem # 2:  PERSONAL HISTORY PNEUMONIA RECURRENT (ICD-V12.61) Assessment: Improved  Resolved symptoms of recent LLL pneumonia. Previous RLL pneumonia resolved by CXR 09/21/09.  Recommeded avoidance of crowds and those with known "colds"  Orders: [place]- Est Level  3 [id number])  Problem # 3:  HYPERTENSION, BENIGN ESSENTIAL (ICD-401.1) Assessment: Improved  Adequate control by Standing BP.  Titrate to standing pressures only.  Will continue HCTZ 12.5 mg every other day.  Patient does not want to go to daily HCTZ use. Continue  metoprolol at 25 mg two times a day.  Her updated medication list for this problem includes:    Metoprolol Tartrate 50 Mg Tabs (Metoprolol tartrate) .Marland KitchenMarland KitchenMarland KitchenMarland Kitchen  Half tablet by mouth twice a day  Orders: [place]- Est Level  3 [id number])  Problem # 4:  Screening Breast Cancer (ICD-V76.10) Advised to call for screening mammogram at [facility].   Complete Medication List: 1)  Advair Diskus 250-50 Mcg/dose Misc (Fluticasone-salmeterol) .... Inhale 1 puff as directed twice a day 2)  Aspirin 325 Mg Tabs (Aspirin) .Marland Kitchen.. 1 tablet by mouth daily 3)  Ativan 0.5 Mg Tabs (Lorazepam) .... Take 1to 2  tablets by mouth every eight hours, as needed for anxiety or shortness of breath 4)  Tums E-x 750 Mg Chew (Calcium carbonate antacid) .... Take 1 tablet twice a day with meals 5)  Nitrostat 0.4 Mg Subl (Nitroglycerin) .Marland Kitchen.. 1 tablet under your tongue every five minutes as needed for chest pain. disp:25 tab 6)  Spiriva Handihaler 18 Mcg Caps (Tiotropium bromide monohydrate) .... Inhale one powder of one capsule once daily. 7)  Ventolin Hfa 108 (90 Base) Mcg/act Aers (Albuterol sulfate) .... 2 sprays inhaled four times a day, if needed for shortness of breath. disp:1 mdi. refill: 5. 8)  Omeprazole 40 Mg Cpdr (Omeprazole) .... One tablet by mouth once a day 9)  Simvastatin 40 Mg Tabs (Simvastatin) .Marland Kitchen.. 1 tablet by mouth once a day at bedtime 10)  Multivitamins Tabs (Multiple vitamin) .Marland Kitchen.. 1 tablet by mouth daily 11)  Tramadol Hcl 50 Mg Tabs (Tramadol hcl) .Marland Kitchen.. 1 to 2  tablets four times a day as needed for pain 12)  Metoprolol Tartrate 50 Mg Tabs (Metoprolol tartrate) .... Half tablet by mouth twice a day 13)  Duoneb 0.5-2.5 (3) Mg/68ml Soln (Ipratropium-albuterol) .... Inhale one unit dose vial four times a day.  disp: one month supply. 14)  Vitamin B-12 Cr 2000 Mcg Cr-tabs (Cyanocobalamin) .Marland Kitchen.. 1 tablet by mouth daily for b12 deficiency  Patient Instructions: 1)  Please schedule a follow-up appointment in 3  months. 2)  Starting Vitamin B12 for Vitamin B12 deficiency.  One tablet by mouth daily. 3)  No change in HCTZ doisng.  Continue taking one capsule every other day. 4)  Remember to go for your mammogram.  5)  Come into the [facility] to get hemoglobin checked after starting Vitamin B12 in about 4 weeks.  6)    Prescriptions: VITAMIN B-12 CR 2000 MCG CR-TABS (CYANOCOBALAMIN) 1 tablet by mouth daily for B12 deficiency  #100 x 3   Entered and Authorized by:   Sherren Mocha McDiarmid MD   Signed by:   Sherren Mocha McDiarmid MD on 10/12/2009   Method used:   Electronically to        [facility] (retail)       [address]       [place], [place]  [address]       Ph: [phone] or [phone]       Fax: [phone]   RxID:   [id number]    Prevention & Chronic Care Immunizations   Influenza vaccine: Fluvax 3+  (12/01/2008)   Influenza vaccine due: 11/25/2008    Tetanus booster: 05/30/2003: Done.   Tetanus booster due: 05/29/2013    Pneumococcal vaccine: Done.  (02/29/2000)   Pneumococcal vaccine due: None    H. zoster vaccine: Not documented  Colorectal Screening   Hemoccult: Done.  (12/29/1996)   Hemoccult due: Not Indicated    Colonoscopy: abnormal  (10/24/2007)   Colonoscopy due: 10/23/2017  Other Screening   Pap smear: Advised  (10/11/2007)   Pap  smear action/deferral: Not indicated-other  (05/11/2009)   Pap smear due: 10/10/2008    Mammogram: Normal  (11/16/2007)   Mammogram action/deferral: Ordered  (05/11/2009)   Mammogram due: 11/15/2008    DXA bone density scan: Not documented   DXA scan due: Not Indicated    Smoking status: quit < 6 months  (10/12/2009)  Lipids   Total Cholesterol: 87  (11/06/2008)   LDL: 48  (11/06/2008)   LDL Direct: Not documented   HDL: 25  (11/06/2008)   Triglycerides: 70  (11/06/2008)    SGOT (AST): 13  (10/07/2009)   SGPT (ALT): <8 U/L  (10/07/2009)   Alkaline phosphatase: 84   (10/07/2009)   Total bilirubin: 0.4  (10/07/2009)    Lipid flowsheet reviewed?: Yes   Progress toward LDL goal: At goal  Hypertension   Last Blood Pressure: 156 / 89  (10/12/2009)   Serum creatinine: 1.16  (09/21/2009)   Serum potassium 3.8  (09/21/2009)    Hypertension flowsheet reviewed?: Yes   Progress toward BP goal: At goal  Self-Management Support :   Personal Goals (by the next clinic visit) :      Personal blood pressure goal: 145/90  (09/21/2009)     Personal LDL goal: 100  (12/01/2008)    Hypertension self-management support: Written self-care plan, Education handout  (12/01/2008)    Hypertension self-management support not done because: Good outcomes  (10/12/2009)    Lipid self-management support: Not documented     Lipid self-management support not done because: Good outcomes  (10/12/2009)

## 2010-03-30 NOTE — Progress Notes (Signed)
Summary: phn msg   Phone Note Call from Patient Call back at Home Phone [phone]   Caller: Patient Summary of Call: needs a doctors note for her daughter stating that she needed to be with her mother for medical condition this week - she was needed at home [name] is daughter  pls give note to [name] Initial call taken by: Audie Clear,  October 16, 2009 11:03 AM  Follow-up for Phone Call        I spoke with [name] who reported that her daughter [name] stayed with her because of knee was limiting her abilities.   [name] is requesting a note stating this event to give to [name] employer because she missed work to stay with and help her mother.  The Note states " [name] is my patient.  She reports that her Daughter, [name], had to stay with her because of her medical condition was limiting her abilities this last week." it is signed by undersigned  The note may be picked up at the [facility].  Follow-up by: Sherren Mocha Jaque Dacy MD,  October 16, 2009 12:35 PM     Appended Document: phn msg emailed note to daughter @ [email or web address]

## 2010-03-30 NOTE — Progress Notes (Signed)
Summary:  Called to patient to monitor her illness.   Phone Note Outgoing Call Call back at [facility] Phone [phone]   Call placed by: Sherren Mocha Evah Rashid MD,  September 25, 2009 4:03 PM Call placed to: Patient Summary of Call: Call to monitor patient's pneumonial illness. Mrs Mess reports not feeling worse today, and perhaps a little better. She is tolerating the Avelox and Duoneb treatment. She know to contact On call physician if condition declines over weekend. Initial call taken by: Sherren Mocha Shyne Resch MD,  September 25, 2009 4:05 PM

## 2010-03-30 NOTE — Progress Notes (Signed)
Summary: Call to patient to monitor pneumonial illness and Tx response.    Phone Note Outgoing Call Call back at [facility] Phone [phone]   Call placed by: Sherren Mocha Alexza Norbeck MD,  September 24, 2009 2:34 PM Call placed to: Patient Summary of Call: Called to patient to monitor her illness. No new concerns. She has noticed an increase in how much sputum she is getting up compared to yesterday since starting the nebulized Duoneb therapy. Has started Avelox without problems.  No change in current therapy planned.  Will call patient tomorrow to continue monitoring course of pneumonial illness.   Initial call taken by: Sherren Mocha Delvis Kau MD,  September 24, 2009 2:36 PM

## 2010-03-30 NOTE — Discharge Summary (Signed)
Summary: Discharge Summary 4/25 - 06/25/09    NAME:  [name], [name]                   ACCOUNT NO.:  [id number]      MEDICAL RECORD NO.:  [id number]          PATIENT TYPE:  INP      LOCATION:  [place]                         FACILITY:  [facility]      PHYSICIAN:  [name], M.D.    DATE OF BIRTH:  [birth year]      DATE OF ADMISSION:  06/22/2009   DATE OF DISCHARGE:  06/25/2009                                  DISCHARGE SUMMARY      DISCHARGE DIAGNOSIS:  Osteoarthritis left knee.      OTHER DIAGNOSES:   1. Acute blood loss anemia.  The patient received 2 units packed red       blood cells for this problem.   2. Chronic obstructive pulmonary disease.   3. Anxiety.   4. Cataracts.   5. Hypertension.   6. Coronary artery disease with history of myocardial infarction       August 2010.   7. Hypercholesterolemia.   8. Atrial fibrillation.   9. Pacemaker placement.   10.Diverticulosis.   11.Hiatal hernia.   12.Reflux disease.   13.Renal insufficiency due to only having one kidney from previous       nephrectomy.      PROCEDURES:   1. Left total knee arthroplasty on April 25.   2. Transfusion 2 units packed red blood cells on April 26 for blood       loss anemia.      HOSPITAL COURSE:  [name] was admitted via the operating room on April   25 at which time she underwent a left total knee arthroplasty by Dr.   Wynelle Link, assisted by Arlee Muslim, PA-C.  It was performed under spinal   anesthesia with Duramorph.  She tolerated the procedure well without   complications.  She was transferred to the orthopedic floor in stable   condition with intact neurovascular function of her left lower   extremity.  On postoperative day #1, it was noted that her hemoglobin   was 8.0.  Her preoperative hemoglobin was only 10.9.  She was transfused   2 units of packed red blood cells for blood loss anemia and responded   very well to that.  She had improved strength after the transfusion.   Physical therapy  was initiated on postoperative day #1 at which time she   walked 23 feet.  On postoperative day #2, she had a hemoglobin increased   to 10.1.  She did extremely well with her physical therapy.  Pain was   under good control.  She walked over 100 feet on postoperative day #2.   She is tolerating a regular diet with intact neurovascular function to   the left lower extremity.  The hemoglobin, as of postoperative day #3,   was 9.8 with INR of 2.94.  She is on Coumadin for postop DVT   prophylaxis.  She did have some abdominal oblique muscle soreness on   postop day #3.  Her abdominal exam showed the abdomen to be  soft,   nontender, nondistended with positive bowel sounds.  She was passing   flatus and had not had a bowel movement as of the time of discharge.   She did have renal insufficiency noted preoperatively with a creatinine   of 2.08.  By the time of discharge, she was producing normal amounts of   urine and her creatinine was stable at 2.3.      DISCHARGE MEDICATIONS:  Include:   1. Norco 5/325 one to two tablets every 4-6 hours as needed for pain.   2. Advair Diskus 250/50 one puff twice a day.   3. Aspirin 325 mg daily.   4. Ativan 0.5 mg one to two every 6-8 hours as needed for anxiety.   5. Lasix 40 mg daily as needed for leg swelling.   6. Nitrostat 0.4 mg sublingual every 5 minutes as needed for chest       pain.   7. Spiriva inhaler one dose daily.   8. Ventolin HFA 2 sprays four times a day as needed for COPD flare.   9. Prilosec 40 mg p.o. daily.   10.Zocor 40 mg p.o. q.h.s.   11.Flexeril 5 mg p.o. t.i.d. p.r.n. spasm.   12.Lopressor/hydrochlorothiazide 50/25 one-half tablet p.o. b.i.d.   13.Tramadol 50 mg one to two every 6-8 hours as needed for pain.   14.Klor-Con 20 mEq p.o. on days when she takes the Lasix.   15.Ambien 5 mg p.o. q.h.s. p.r.n. insomnia.   16.Coumadin for DVT prophylaxis.  Her Coumadin doses were 5 mg on       April 25, 3 mg on April 26, 1 mg on April  27.  Her INR at time of       discharge is 2.94.  The dosage on April 28 should be 1 mg and then       monitored by pharmacy to keep her INR between 2 and 3.  She will be       on Coumadin until Sunday, May 15.      ACTIVITIES:  Weightbearing as tolerated, left lower extremity.  She is   to participate in physical therapy for ambulation and full range of   motion of the knee      FOLLOWUP:  She will follow up with [place] in 2 weeks in the office.  Please   call [phone] for an appointment with Dr. Wynelle Link on Tuesday, May 10.   She may shower at this time.      CONDITION:  Good upon discharge.               [name], M.D.

## 2010-03-30 NOTE — Assessment & Plan Note (Signed)
Summary: F/U/KH   Vital Signs:  Patient profile:   77 year old female Height:      64 inches Weight:      170.7 pounds BMI:     29.41 O2 Sat:      93 % on Room air Temp:     98.3 degrees F oral Pulse rate:   96 / minute Pulse (ortho):   86 / minute BP standing:   137 / 80  Vitals Entered By: Levert Feinstein LPN (July 25, 624THL 075-GRM PM)  O2 Flow:  Room air  CC: f/u Is Patient Diabetic? No Pain Assessment Patient in pain? yes     Location: right ear   Serial Vital Signs/Assessments:  Time      Position  BP       Pulse  Resp  Temp     By 4:29 PM   Lying RA  165/83   103                   Jessica Fleeger CMA 4:29 PM   Sitting   154/81   96                    Jessica Fleeger CMA 4:29 PM   Standing  137/80   Newborn   Primary Care Provider:  Fransico Sciandra MD  CC:  f/u.  History of Present Illness: Hypotension Patient stopped HCTZ 2 and half weeks ago.  BP at [facility] 2 weeks ago still with SBP < 100.  No syncope or near-syncope.  No falls.    She has continue metoprolol 25 mg two times a day  Edema, generalized Patient noted bilateral ankle and hand swelling last week. Took dose of lasix on two successive days this week with resolution of edema.  COPD No new recommendations from Dr Casper Harrison [facility]) Pt noticed frequent dry, non-productive cough and right ear pain. No fever. No worsening of chronic shortness of breath.  No change in amount of sputum produced except for increase amount of sputum in the morning upon awakening. Dx and tx with pneumonia around mid-June.  ROS in HPI Medication listed and updated in medication list. Smoking status documented by nursing and reviewed.  Right ear pain Onset 2-3 days ago. Not progressive.  Non radiation. No decrease in hearing (+) postnasal drip. Not using flonase that she has at home.   Chest pain episode Awoke her from sleep 4 days ago.   Responded to two NTG SL. No  recurrence since that episode.  Know residual obtuse marginal with 90% stenosis on cath 2008.  Bare metal stent in RCA 2008.        Habits & Providers  Alcohol-Tobacco-Diet     Alcohol drinks/day: <1     Tobacco Status: quit > 6 months     Tobacco Counseling: to remain off tobacco products  Current Problems (verified): 1)  Myocardial Infarction, Hx of  (ICD-412) 2)  Hypertension, Benign Essential  (ICD-401.1) 3)  Ventricular Hypertrophy, Left  (ICD-429.3) 4)  Atrial Fibrillation  (ICD-427.31) 5)  Sick Sinus Syndrome  (ICD-427.81) 6)  Edema-legs,due To Venous Obstruct.  (ICD-459.2) 7)  Chronic Kidney Disease Stage Iii (MODERATE)  (ICD-585.3) 8)  Prediabetes  (ICD-790.29) 9)  Anemia, Normocytic  (0000000) 10)  Metabolic Syndrome X  (A999333) 11)  Hyperlipidemia  (ICD-272.4) 12)  COPD  (  ICD-496) 13)  Hx of Pneumonia, Right Upper Lobe  (ICD-486) 14)  Anxiety  (ICD-300.00) 15)  Rhinitis, Allergic  (ICD-477.9) 16)  Overweight  (ICD-278.02) 17)  Osteoarthritis, Lower Leg  (ICD-715.96) 18)  Osteoarthritis of Spine, Nos  (ICD-721.90) 19)  Hernia, Hiatal, Noncongenital  (ICD-553.3) 20)  Lumbar Spinal Stenosis  (ICD-724.02) 21)  Disc With Radiculopathy  (ICD-722.71) 22)  Hx of Candidiasis of The Esophagus  (ICD-112.84) 23)  Sx of Abdominal Pain, Chronic  (ICD-789.00) 24)  Hemorrhoids, Nos  (ICD-455.6) 25)  Gastroesophageal Reflux, No Esophagitis  (ICD-530.81) 26)  Schatzki's Ring, Hx of  (ICD-V12.79) 27)  Adenomatous Colonic Polyp  (ICD-211.3) 28)  Follow-up, High Risk Treatment Nec  (ICD-V67.51) 29)  Encounter For Long-term Use of Other Medications  (ICD-V58.69)  Current Medications (verified): 1)  Advair Diskus 250-50 Mcg/dose Misc (Fluticasone-Salmeterol) .... Inhale 1 Puff As Directed Twice A Day 2)  Aspirin 325 Mg Tabs (Aspirin) .Marland Kitchen.. 1 Tablet By Mouth Daily 3)  Ativan 0.5 Mg Tabs (Lorazepam) .... Take 1to 2  Tablets By Mouth Every Eight Hours, As Needed For Anxiety or  Shortness of Breath 4)  Tums E-X 750 Mg Chew (Calcium Carbonate Antacid) .... Take 1 Tablet Twice A Day With Meals 5)  Nitrostat 0.4 Mg  Subl (Nitroglycerin) .Marland Kitchen.. 1 Tablet Under Your Tongue Every Five Minutes As Needed For Chest Pain. Disp:25 Tab 6)  Spiriva Handihaler 18 Mcg Caps (Tiotropium Bromide Monohydrate) .... Inhale One Powder of One Capsule Once Daily. 7)  Ventolin Hfa 108 (90 Base) Mcg/act Aers (Albuterol Sulfate) .... 2 Sprays Inhaled Four Times A Day, If Needed For Shortness of Breath. Disp:1 Mdi. Refill: 5. 8)  Omeprazole 40 Mg Cpdr (Omeprazole) .... One Tablet By Mouth Once A Day 9)  Simvastatin 40 Mg Tabs (Simvastatin) .Marland Kitchen.. 1 Tablet By Mouth Once A Day At Bedtime 10)  Multivitamins   Tabs (Multiple Vitamin) .Marland Kitchen.. 1 Tablet By Mouth Daily 11)  Tramadol Hcl 50 Mg Tabs (Tramadol Hcl) .Marland Kitchen.. 1 To 2  Tablets Four Times A Day As Needed For Pain 12)  Metoprolol Tartrate 50 Mg Tabs (Metoprolol Tartrate) .... Half Tablet By Mouth Twice A Day  Allergies (verified): 1)  Codeine Phosphate (Codeine Phosphate) 2)  * Cox 2 Inhibitors 3)  Singulair (Montelukast Sodium) 4)  * Sotalol  Past History:  Past medical, surgical, family and social histories (including risk factors) reviewed for relevance to current acute and chronic problems.  Past Medical History: Reviewed history from 11/10/2008 and no changes required. Asthmatic Bronchitis, Chronic, COPD, Stage 3 (05/06), PFT(09/06) at [facility] (Dr. Casper Harrison, [place]):  FEV1 61%; FVC 77%, nl diffusion capacity c/w moderate COPD.  Post-Bronchodilator FEV1 increased 19%    Hosp`n 05/07 - Shingles Chest Pain, Echo-Nl EF, Nl LV, No wall motion abnl -07/25/2003, ETT 11/98; poor aerobic fitness ETT 11/98; adeq, low prob of obstructive CAD - 08/28/2001  Pacemaker placement, dual chamber placed 03/05/2003 (Dr Lovena Le) Hospitalized for NSTEMI ( 10/02/2008) PCI (10/02/2008, Dr Daneen Schick): S/P NSTEMI,  RCA [place] placed: 99% lesion to 0% lesion.  Circumflex Obtuse marginal with 90% stenosis (left for medical mangement for now)  Hospitalization (9/8-9/10, Dr Daneen Schick, III, Cardiology) for Paroxysmal Atrial Fibrillation with RVR and anginal pain secondary to demand/supply mismatch in setting of RVR with known circumflex artery branch disease.    Left adrenal mass by 6/99 CT, stable for many years - incidentaloma, Adrenal adenoma, left, stable on CT 2007  Only one Kidney - surgery transitional cell cancer, ONLY ONE KIDNEY!!!,  Surveillance colonoscopy 2005 by Dr. Cristina Gong found colonic adenomatous polyps Hernia, umbilical  Central HNP w/ L4&5 root encroachment - 05/24/2001,   Spinal Stenosis: L-S MRI: L4-5 Spinal stenosis, - 03/03/2006, Spondylolithesis L5-S1(mild), DJD L4-5 on X-Ray 11/04 R. Hip Xray Unremarkable - 04/19/2001 Right heel spur 11/04  Has medication nebulizer device at home Vocal Polp excised by Dr. Purcell Nails (ENT) "many years ago"  Past Surgical History: Reviewed history from 07/22/2009 and no changes required. Bone Scan-Whole Body: L5 face degeneration , bilateral knee joint degeneration - 01/30/2003,  Cardiolite: No evidence ischemia, EF68% - 09/29/2002,   Cholecystectomy H/O Ruptured Appendix    Nephrectomy for Transitional Cell Cancer Dr. Leander Rams -, 05/24/2001. Surveillance Cystoscopy was WNL by Dr. Leander Rams - 06/11/2001  Right Knee Replacement (Dr Wynelle Link) 2009 Left Knee Arthroplasty (Dr Maureen Ralphs) 05/2009       Pacemaker lead extraction & reinsertion - 07/25/2003    Family History: Reviewed history from 06/18/2007 and no changes required. Father with MI age<55 yrs. Mother died age 66 years-old. Mother with Dementia of Alzheimers' Type.  Social History: Reviewed history from 05/11/2009 and no changes required. 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   Pt has home nebulizer for  inhalation therapies. Former Smoker Smoking Status:  quit > 6 months  Review of Systems ENT:  Complains of nasal congestion and postnasal drainage; denies ear discharge. CV:  Denies palpitations. Resp:  Denies coughing up blood and pleuritic. GI:  Denies indigestion. Neuro:  Denies sensation of room spinning.  Physical Exam  General:  alert. cooperative. groomed. speaking in full sentences. No apparent distress.   VS noted - Sao2 93% on Room air Eyes:  No conjunctival injection Ears:  External ear exam shows no significant lesions or deformities.  Otoscopic examination reveals clear canals, tympanic membranes are intact bilaterally without bulging, retraction, inflammation or discharge. tympanogram R. ear with postive pressure without abnormal pressure amplitude.  Nose:  no nasal discharge.   Neck:  No cervical LAN Lungs:  normal respiratory effort, no accessory muscle use, no crackles, and no wheezes.   Heart:  normal rate, regular rhythm, no murmur, no gallop, no rub, and no JVD.   Abdomen:  soft, non-tender, normal bowel sounds, and no distention.   Extremities:  No peripheral edema of feet or hands   Impression & Recommendations:  Problem # 1:  HYPOTENSION (ICD-458.9) Assessment Improved  Standing BP lower than supine BP.  Standing pressure < 140/90.  Will need to make treatment decisions based on Standing BP.  Plan to continue metoprolol 50mg , half tablet twice a day.  Given the re-introduction of HCTZ 12.5 mg tablet by mouth every other day to help with mild ankle and hand edema, RTC 2-3 weeks to recheck BP.  Orders: [place]- Est  Level 4 (99214)  Problem # 2:  EAR PAIN, RIGHT (ICD-388.70) Assessment: New  Positive pressure behind right TM by tympanogram without evidence of inflammation.  Given postnasal drip and cough complaint, recommend restarting nasal corticosteroid Flonase, two sprays each nostril once a day.  Will reassess in 2-3 weeks.   Orders: [place]- Est  Level 4  [id number])  Problem # 3:  Hx of PNEUMONIA, RIGHT UPPER LOBE (ICD-486) Assessment: Comment Only Now 5 weeks after abnormal CXR with possible infiltate in RUL will recheck CXR for follow up, especially given the current cough.   Problem # 4:  PERIPHERAL EDEMA (ICD-782.3) Assessment: New Recent onset of bilateral ankle and hand edema that resolved with two doses of lasix.  may be related to recent discontinuation of HCTZ.  Will check BNP, hepatic panel. GFF is improved today from May.  Currently 1.16 mg/dL. If BNP increasing, will consider Echocardiogram.  last echo in [place] was in 2005.  Pt may have had echos at [facility] cardiology./   Orders: [facility]- Est  Level 4 ([address]214)Future Orders: B Nat Peptide-[facility] [id number]) ... 08/30/2010 Hepatic-[facility] [phone]) ... 08/30/2010  Problem # 5:  RENAL DISEASE, CHRONIC, MILD (ICD-585.2) Assessment: Improved GFF is improved today from May 2011 level of Cr = 1.49 mf/dL.   Currently 1.16 mg/dL.  Orders: Basic Met-[facility] [id number])  Problem # 6:  ANEMIA (N067566.9) Assessment: Unchanged Hgb 9.7, normocytic with widening RDW over last few months.  Essentially unchanged Hgb from earlier in month.    Normocytic.  Retic % inappropriately low for level of Hgb suggesting production problem.  Normal LDH.  Ferritin within formal limits.  Low normal Vitamin B12- Plan: Check Methamalonic acid and Homocyteine serum levels to see if pt is B12 deficient.  If is B12 deficient, will treat deficiency and monitor Hgb for response.    Hgb: 10.0 (09/02/2009)   Hct: 31.0 (09/02/2009)   Platelets: 255 (09/02/2009) RBC: 3.38 (09/02/2009)   RDW: 15.0 (09/02/2009)   WBC: 8.5 (09/02/2009) MCV: 91.7 (09/02/2009)   MCHC: 32.3 (09/02/2009) TSH: 2.049 (11/05/2008)  Problem # 7:  PREDIABETES (ICD-790.29) A1C 5.6%. Improved from 6.0% from 08/2008.  patient's weight loss likely origin of this evidence of improved glycemic control.  Orders: A1C-[facility] [id number]) [place]- Est  Level 4  [id number])  Problem # 8:  MYOCARDIAL INFARCTION, HX OF (ICD-412) Assessment: Comment Only Angina-like event few days ago that awoke pt from her sleep.  Pain apparently responded to NTG SL x 2 .  No recurrence.  Patient with know 90% obtuse marginal CAD that is medically managed.  Patient has declined coronary angiocatheterizaztion by Dr Linard Millers (Card) in past, primarily b./c of her concerns about contrast dye and her solitary kidney.  Patient knows that should pain persist over 10 minutes to contact EMS.  Also to contact EMS if no response to two NTG SL.  The following medications were removed from the medication list:    Hydrochlorothiazide 12.5 Mg Caps (Hydrochlorothiazide) ..... One tablet by mouth every other day Her updated medication list for this problem includes:    Aspirin 325 Mg Tabs (Aspirin) .Marland Kitchen... 1 tablet by mouth daily    Nitrostat 0.4 Mg Subl (Nitroglycerin) .Marland Kitchen... 1 tablet under your tongue every five minutes as needed for chest pain. disp:25 tab    Metoprolol Tartrate 50 Mg Tabs (Metoprolol tartrate) ..... Half tablet by mouth twice a day  Orders: [place]- Est  Level 4 [id number])  Problem # 9:  Screening Breast Cancer (ICD-V76.10) Remind patient that if she desires to continue breast cancer screening, then it is time for mammogram screening.   Complete Medication List: 1)  Advair Diskus 250-50 Mcg/dose Misc (Fluticasone-salmeterol) .... Inhale 1 puff as directed twice a day 2)  Aspirin 325 Mg Tabs (Aspirin) .Marland Kitchen.. 1 tablet by mouth daily 3)  Ativan 0.5 Mg Tabs (Lorazepam) .... Take 1to 2  tablets by mouth every eight hours, as needed for anxiety or shortness of breath 4)  Tums E-x 750 Mg Chew (Calcium carbonate antacid) .... Take 1 tablet twice a day with meals 5)  Nitrostat 0.4 Mg Subl (Nitroglycerin) .Marland Kitchen.. 1 tablet under  your tongue every five minutes as needed for chest pain. disp:25 tab 6)  Spiriva Handihaler 18 Mcg Caps (Tiotropium bromide monohydrate) .... Inhale one powder of one  capsule once daily. 7)  Ventolin Hfa 108 (90 Base) Mcg/act Aers (Albuterol sulfate) .... 2 sprays inhaled four times a day, if needed for shortness of breath. disp:1 mdi. refill: 5. 8)  Omeprazole 40 Mg Cpdr (Omeprazole) .... One tablet by mouth once a day 9)  Simvastatin 40 Mg Tabs (Simvastatin) .Marland Kitchen.. 1 tablet by mouth once a day at bedtime 10)  Multivitamins Tabs (Multiple vitamin) .Marland Kitchen.. 1 tablet by mouth daily 11)  Tramadol Hcl 50 Mg Tabs (Tramadol hcl) .Marland Kitchen.. 1 to 2  tablets four times a day as needed for pain 12)  Metoprolol Tartrate 50 Mg Tabs (Metoprolol tartrate) .... Half tablet by mouth twice a day  Other Orders: CBC-[facility] [id number]) Retic-[facility] [id number]) Ferritin-[facility] [id number]) LDH-[facility] [phone]) CXR- 2view (CXR) B12-[facility] [id number]) Future Orders: Miscellaneous Lab Charge-[facility] [id number]) ... 08/30/2010  Patient Instructions: 1)  Please schedule a follow-up appointment in 2-3 weeks. 2)  Take half tablet of hydrochlorothiazide daily to help with leg swelling.  3)  Go for Chest X-ray 4)  Re-start Flonase nasal spray with two sprays each nostril once a day   Prescriptions: HYDROCHLOROTHIAZIDE 12.5 MG CAPS (HYDROCHLOROTHIAZIDE) One tablet by mouth every other day  #15 x 3   Entered and Authorized by:   Sherren Mocha Braylin Formby MD   Signed by:   Sherren Mocha Addisson Frate MD on 09/22/2009   Method used:   Telephoned to ...       [facility]* (retail)       [address]       [place], [place]  [address]       Ph: [phone] or [phone]       Fax: [phone]   RxID:   [id number] HYDROCHLOROTHIAZIDE 12.5 MG TABS (HYDROCHLOROTHIAZIDE) Half tablet by mouth daily  #15 x 3   Entered and Authorized by:   Sherren Mocha Alisandra Son MD   Signed by:   Sherren Mocha Jatinder Mcdonagh MD on 09/21/2009   Method used:   Electronically to        [facility]* (retail)       [address]       [place], [place]  [address]       Ph: [phone] or [phone]        Fax: [phone]   RxID:   [id number]   Laboratory Results   Blood Tests   Date/Time Received: September 21, 2009 5:10 PM  Date/Time Reported: September 21, 2009 5:28 PM   HGBA1C: 5.6%   (Normal Range: Non-Diabetic - 3-6%   Control Diabetic - 6-8%)  Comments: ...........test performed by...........Marland KitchenHedy Camara, CMA      Prevention & Chronic Care Immunizations   Influenza vaccine: Fluvax 3+  (12/01/2008)   Influenza vaccine due: 11/25/2008    Tetanus booster: 05/30/2003: Done.   Tetanus booster due: 05/29/2013    Pneumococcal vaccine: Done.  (02/29/2000)   Pneumococcal vaccine due: None    H. zoster vaccine: Not documented  Colorectal Screening   Hemoccult: Done.  (12/29/1996)   Hemoccult due: Not Indicated    Colonoscopy: abnormal  (10/24/2007)   Colonoscopy due: 10/23/2017  Other Screening   Pap smear: Advised  (10/11/2007)   Pap smear action/deferral: Not indicated-other  (05/11/2009)   Pap smear due: 10/10/2008  Mammogram: Normal  (11/16/2007)   Mammogram action/deferral: Ordered  (05/11/2009)   Mammogram due: 11/15/2008    DXA bone density scan: Not documented   DXA scan due: Not Indicated    Smoking status: quit > 6 months  (09/21/2009)  Lipids   Total Cholesterol: 87  (11/06/2008)   LDL: 48  (11/06/2008)   LDL Direct: Not documented   HDL: 25  (11/06/2008)   Triglycerides: 70  (11/06/2008)    SGOT (AST): 15  (09/02/2009)   SGPT (ALT): 12  (09/02/2009)   Alkaline phosphatase: 66  (09/02/2009)   Total bilirubin: 0.5  (09/02/2009)    Lipid flowsheet reviewed?: Yes   Progress toward LDL goal: At goal  Hypertension   Last Blood Pressure: 96 / 64  (09/02/2009)   Serum creatinine: 1.49  (09/02/2009)   Serum potassium 3.6  (09/02/2009)    Hypertension flowsheet reviewed?: Yes   Progress toward BP goal: At goal  Self-Management Support :   Personal Goals (by the next clinic visit) :      Personal blood pressure goal: 145/90  (09/21/2009)      Personal LDL goal: 100  (12/01/2008)    Hypertension self-management support: Written self-care plan, Education handout  (12/01/2008)    Hypertension self-management support not done because: Good outcomes  (09/21/2009)    Lipid self-management support: Not documented     Lipid self-management support not done because: Good outcomes  (09/21/2009)

## 2010-03-30 NOTE — Miscellaneous (Signed)
Summary: UC: pneumonia f/u   Clinical Lists Changes call received from MD at [facility], [name] has not goten any better on the meds prescribed by Dr McDiarmid for pneumonia. wanted to know whether to send pt to hospital or F/U here tomorrow. advised to have pt follow up here in the clinic tomorrow per sally. pt is supose to call front desk and schedule work-in appointment.

## 2010-03-30 NOTE — Assessment & Plan Note (Signed)
Summary: f/u per MD   Vital Signs:  Patient profile:   77 year old female Height:      63.5 inches Weight:      179 pounds BMI:     31.32 Temp:     98.0 degrees F oral Pulse rate:   92 / minute BP sitting:   127 / 77  (right arm) Cuff size:   regular  Vitals Entered By: Levert Feinstein LPN (March 14, 624THL 624THL AM) CC: Episode of sedation Is Patient Diabetic? No Pain Assessment Patient in pain? no        Primary Care Provider:  Kaziyah Parkison MD  CC:  Episode of sedation.  History of Present Illness: [name] is coming into [facility] for follow up of an episode of confusion & sedation on 05/05/09.  See documention of phone notes.  [name] knows what caused the event.  She had inadvertently requested a refill of an Ambien 10 mg prescription that she had received after her last knee surgery by Dr Maureen Ralphs.  She had unintentionally put one talbet of the Ambien in her scheduled pill box.  When she took it in the morning she was very sedated.  She had improved by the afternoon.  Her daugter, Truman Hayward, and her neighbor stayed with her while she slept.   She has nad no recurrence of this sedation/confusion since this event.  She has not taken any further Ambien.    Habits & Providers  Alcohol-Tobacco-Diet     Alcohol drinks/day: <1     Tobacco Status: never  Current Problems (verified): 1)  Baker's Cyst, Left Knee  (ICD-727.51) 2)  Meniscus Tear  (ICD-836.2) 3)  Myocardial Infarction, Hx of  (ICD-412) 4)  Hypertension, Benign Essential  (ICD-401.1) 5)  Ventricular Hypertrophy, Left  (ICD-429.3) 6)  Atrial Fibrillation  (ICD-427.31) 7)  Sick Sinus Syndrome  (ICD-427.81) 8)  Edema-legs,due To Venous Obstruct.  (ICD-459.2) 9)  Prediabetes  (123456) 10)  Metabolic Syndrome X  (A999333) 11)  Hyperlipidemia  (ICD-272.4) 12)  COPD  (ICD-496) 13)  Anxiety  (ICD-300.00) 14)  Rhinitis, Allergic  (ICD-477.9) 15)  Obesity, Nos  (ICD-278.00) 16)  Osteoarthritis, Lower Leg  (ICD-715.96) 17)   Osteoarthritis of Spine, Nos  (ICD-721.90) 18)  Hernia, Hiatal, Noncongenital  (ICD-553.3) 19)  Lumbar Spinal Stenosis  (ICD-724.02) 20)  Disc With Radiculopathy  (ICD-722.71) 21)  Hx of Candidiasis of The Esophagus  (ICD-112.84) 22)  Sx of Abdominal Pain, Chronic  (ICD-789.00) 23)  Hemorrhoids, Nos  (ICD-455.6) 24)  Gastroesophageal Reflux, No Esophagitis  (ICD-530.81) 25)  Schatzki's Ring, Hx of  (ICD-V12.79) 26)  Adenomatous Colonic Polyp  (ICD-211.3) 27)  Follow-up, High Risk Treatment Nec  (ICD-V67.51) 28)  Encounter For Long-term Use of Other Medications  (ICD-V58.69)  Current Medications (verified): 1)  Advair Diskus 250-50 Mcg/dose Misc (Fluticasone-Salmeterol) .... Inhale 1 Puff As Directed Twice A Day 2)  Aspirin 325 Mg Tabs (Aspirin) .Marland Kitchen.. 1 Tablet By Mouth Daily 3)  Ativan 0.5 Mg Tabs (Lorazepam) .... Take 1to 2  Tablets By Mouth Every Eight Hours, As Needed For Anxiety or Shortness of Breath 4)  Lasix 40 Mg Tabs (Furosemide) .... Take 1 Tablet By Mouth Once A Day As Needed For Leg Swelling 5)  Tums E-X 750 Mg Chew (Calcium Carbonate Antacid) .... Take 1 Tablet Twice A Day With Meals 6)  Nitrostat 0.4 Mg  Subl (Nitroglycerin) .Marland Kitchen.. 1 Tablet Under Your Tongue Every Five Minutes As Needed For Chest Pain. Disp:25 Tab 7)  Spiriva  Handihaler 18 Mcg Caps (Tiotropium Bromide Monohydrate) .... Inhale One Powder of One Capsule Once Daily. 8)  Ventolin Hfa 108 (90 Base) Mcg/act Aers (Albuterol Sulfate) .... 2 Sprays Inhaled Four Times A Day, If Needed For Shortness of Breath. Disp:1 Mdi. Refill: 5. 9)  Omeprazole 40 Mg Cpdr (Omeprazole) .... One Tablet By Mouth Once A Day 10)  Simvastatin 40 Mg Tabs (Simvastatin) .Marland Kitchen.. 1 Tablet By Mouth Once A Day At Bedtime 11)  Flexeril 5 Mg Tabs (Cyclobenzaprine Hcl) .... One Tablet By Mouth Three Times A Day As Needed 12)  Multivitamins   Tabs (Multiple Vitamin) .Marland Kitchen.. 1 Tablet By Mouth Daily 13)  Metoprolol-Hydrochlorothiazide 50-25 Mg Tabs  (Metoprolol-Hydrochlorothiazide) .... Half Tablet By Mouth Twice A Day 14)  Tramadol Hcl 50 Mg Tabs (Tramadol Hcl) .Marland Kitchen.. 1 To 2  Tablets Four Times A Day As Needed For Pain 15)  Klor-Con M20 20 Meq Cr-Tabs (Potassium Chloride Crys Cr) .... Take 1 Tablet By Mouth On Days You Take Lasix. 16)  Ambien 10 Mg Tabs (Zolpidem Tartrate) .... Half Tablet By Mouth At Bedtime As Needed To Fall Asleep.  Take After You Have Gotten Into Bed.  Allergies: 1)  Codeine Phosphate (Codeine Phosphate) 2)  * Cox 2 Inhibitors 3)  Singulair (Montelukast Sodium) 4)  * Sotalol  Past History:  Past medical, surgical, family and social histories (including risk factors) reviewed for relevance to current acute and chronic problems.  Past Medical History: Reviewed history from 11/10/2008 and no changes required. Asthmatic Bronchitis, Chronic, COPD, Stage 3 (05/06), PFT(09/06) at [facility] (Dr. Casper Harrison, [place]):  FEV1 61%; FVC 77%, nl diffusion capacity c/w moderate COPD.  Post-Bronchodilator FEV1 increased 19%    Hosp`n 05/07 - Shingles Chest Pain, Echo-Nl EF, Nl LV, No wall motion abnl -07/25/2003, ETT 11/98; poor aerobic fitness ETT 11/98; adeq, low prob of obstructive CAD - 08/28/2001  Pacemaker placement, dual chamber placed 03/05/2003 (Dr Lovena Le) Hospitalized for NSTEMI ( 10/02/2008) PCI (10/02/2008, Dr Daneen Schick): S/P NSTEMI,  RCA [place] placed: 99% lesion to 0% lesion. Circumflex Obtuse marginal with 90% stenosis (left for medical mangement for now)  Hospitalization (9/8-9/10, Dr Daneen Schick, III, Cardiology) for Paroxysmal Atrial Fibrillation with RVR and anginal pain secondary to demand/supply mismatch in setting of RVR with known circumflex artery branch disease.    Left adrenal mass by 6/99 CT, stable for many years - incidentaloma, Adrenal adenoma, left, stable on CT 2007  Only one Kidney - surgery transitional cell cancer, ONLY ONE KIDNEY!!!,   Surveillance colonoscopy 2005 by Dr. Cristina Gong found  colonic adenomatous polyps Hernia, umbilical  Central HNP w/ L4&5 root encroachment - 05/24/2001,   Spinal Stenosis: L-S MRI: L4-5 Spinal stenosis, - 03/03/2006, Spondylolithesis L5-S1(mild), DJD L4-5 on X-Ray 11/04 R. Hip Xray Unremarkable - 04/19/2001 Right heel spur 11/04  Has medication nebulizer device at home Vocal Polp excised by Dr. Purcell Nails (ENT) "many years ago"  Past Surgical History: Reviewed history from 02/02/2009 and no changes required. Bone Scan-Whole Body: L5 face degeneration , bilateral knee joint degeneration - 01/30/2003,  Cardiolite: No evidence ischemia, EF68% - 09/29/2002,   Cholecystectomy H/O Ruptured Appendix    Nephrectomy for Transitional Cell Cancer Dr. Leander Rams -, 05/24/2001. Surveillance Cystoscopy was WNL by Dr. Leander Rams - 06/11/2001  Right Knee Replacement (Dr Wynelle Link) 2009       Pacemaker lead extraction & reinsertion - 07/25/2003    Family History: Reviewed history from 06/18/2007 and no changes required. Father with MI age<55 yrs. Mother died age  8 years-old. Mother with Dementia of Alzheimers' Type.  Social History: Reviewed history from 12/21/2006 and no changes required. 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   Pt has home nebulizer for inhalation therapies. Former Smoker  Physical Exam  General:  Well-developed,obese ,in no acute distress; alert,appropriate and cooperative throughout examination, well-groomed Neurologic:  Normal gait.  No difficulties rising from chair. Speech clear and prosodic Language structured and goal-directed. Psych:  memory intact for recent and remote, normally interactive, not anxious appearing, and not depressed appearing.     Impression & Recommendations:  Problem # 1:  UNSPEC SEDAT&HYPNOT CAUS ADVRS EFF TX USE (ICD-E937.9) Assessment New  Accidental ingestion of Ambien 10mg   causing excess sedation during daytime. [name] wants to have the Ambien available in case she needs it in the future, but she will only take a halfr tablet and only at bedtime and only after she has gotten into bed.   Orders: [facility]- Est Level  3 [id number])  Problem # 2:  OSTEOARTHRITIS, LOWER LEG (ICD-715.96) [name] will have TKR in April 2011 by Dr Maureen Ralphs (ortho) at [facility]. Her updated medication list for this problem includes:    Aspirin 325 Mg Tabs (Aspirin) .Marland Kitchen... 1 tablet by mouth daily    Flexeril 5 Mg Tabs (Cyclobenzaprine hcl) ..... One tablet by mouth three times a day as needed    Tramadol Hcl 50 Mg Tabs (Tramadol hcl) .Marland Kitchen... 1 to 2  tablets four times a day as needed for pain  Problem # 3:  Screening Breast Cancer (ICD-V76.10) Patient advised to go for her screening mammogram.  She plans to schedule in herself with [facility] (Dr Chrys Racer old practice).  Complete Medication List: 1)  Advair Diskus 250-50 Mcg/dose Misc (Fluticasone-salmeterol) .... Inhale 1 puff as directed twice a day 2)  Aspirin 325 Mg Tabs (Aspirin) .Marland Kitchen.. 1 tablet by mouth daily 3)  Ativan 0.5 Mg Tabs (Lorazepam) .... Take 1to 2  tablets by mouth every eight hours, as needed for anxiety or shortness of breath 4)  Lasix 40 Mg Tabs (Furosemide) .... Take 1 tablet by mouth once a day as needed for leg swelling 5)  Tums E-x 750 Mg Chew (Calcium carbonate antacid) .... Take 1 tablet twice a day with meals 6)  Nitrostat 0.4 Mg Subl (Nitroglycerin) .Marland Kitchen.. 1 tablet under your tongue every five minutes as needed for chest pain. disp:25 tab 7)  Spiriva Handihaler 18 Mcg Caps (Tiotropium bromide monohydrate) .... Inhale one powder of one capsule once daily. 8)  Ventolin Hfa 108 (90 Base) Mcg/act Aers (Albuterol sulfate) .... 2 sprays inhaled four times a day, if needed for shortness of breath. disp:1 mdi. refill: 5. 9)  Omeprazole 40 Mg Cpdr (Omeprazole) .... One tablet by mouth once a day 10)  Simvastatin 40 Mg  Tabs (Simvastatin) .Marland Kitchen.. 1 tablet by mouth once a day at bedtime 11)  Flexeril 5 Mg Tabs (Cyclobenzaprine hcl) .... One tablet by mouth three times a day as needed 12)  Multivitamins Tabs (Multiple vitamin) .Marland Kitchen.. 1 tablet by mouth daily 13)  Metoprolol-hydrochlorothiazide 50-25 Mg Tabs (Metoprolol-hydrochlorothiazide) .... Half tablet by mouth twice a day 14)  Tramadol Hcl 50 Mg Tabs (Tramadol hcl) .Marland Kitchen.. 1 to 2  tablets four times a day as needed for pain 15)  Klor-con M20 20 Meq Cr-tabs (Potassium chloride crys cr) .... Take 1 tablet by mouth  on days you take lasix. 16)  Ambien 10 Mg Tabs (Zolpidem tartrate) .... Half tablet by mouth at bedtime as needed to fall asleep.  take after you have gotten into bed.  Other Orders: Mammogram (Screening) (Mammo)  Patient Instructions: 1)  Please schedule a follow-up appointment in 2 months.  Prescriptions: FLEXERIL 5 MG TABS (CYCLOBENZAPRINE HCL) One tablet by mouth three times a day as needed  #30 Tablet x 5   Entered and Authorized by:   Sherren Mocha Hutchinson Isenberg MD   Signed by:   Sherren Mocha Shannan Slinker MD on 05/11/2009   Method used:   Electronically to        [facility] (retail)       [address]       [place], [place]  [id number]       Ph: [phone] or [phone]       Fax: [phone]   RxID:   [id number]    Prevention & Chronic Care Immunizations   Influenza vaccine: Fluvax 3+  (12/01/2008)   Influenza vaccine due: 11/25/2008    Tetanus booster: 05/30/2003: Done.   Tetanus booster due: 05/29/2013    Pneumococcal vaccine: Done.  (02/29/2000)   Pneumococcal vaccine due: None    H. zoster vaccine: Not documented  Colorectal Screening   Hemoccult: Done.  (12/29/1996)   Hemoccult due: Not Indicated    Colonoscopy: abnormal  (10/24/2007)   Colonoscopy due: 10/23/2017  Other Screening   Pap smear: Advised  (10/11/2007)   Pap smear action/deferral: Not indicated-other  (05/11/2009)   Pap smear due:  10/10/2008    Mammogram: Normal  (11/16/2007)   Mammogram action/deferral: Ordered  (05/11/2009)   Mammogram due: 11/15/2008    DXA bone density scan: Not documented   DXA scan due: Not Indicated    Smoking status: never  (05/11/2009)  Lipids   Total Cholesterol: 87  (11/06/2008)   LDL: 48  (11/06/2008)   LDL Direct: Not documented   HDL: 25  (11/06/2008)   Triglycerides: 70  (11/06/2008)    SGOT (AST): 16  (10/08/2008)   SGPT (ALT): 12  (10/08/2008)   Alkaline phosphatase: 82  (10/08/2008)   Total bilirubin: 1.3  (10/08/2008)    Lipid flowsheet reviewed?: Yes   Progress toward LDL goal: At goal  Hypertension   Last Blood Pressure: 127 / 77  (05/11/2009)   Serum creatinine: 1.10  (11/12/2008)   Serum potassium 4.5  (11/12/2008)    Hypertension flowsheet reviewed?: Yes   Progress toward BP goal: At goal  Self-Management Support :   Personal Goals (by the next clinic visit) :      Personal blood pressure goal: 140/90  (12/01/2008)     Personal LDL goal: 100  (12/01/2008)    Hypertension self-management support: Written self-care plan, Education handout  (12/01/2008)    Hypertension self-management support not done because: Good outcomes  (05/11/2009)    Lipid self-management support: Not documented     Lipid self-management support not done because: Good outcomes  (05/11/2009)   Nursing Instructions: Schedule screening mammogram (see order)

## 2010-03-30 NOTE — Progress Notes (Signed)
Summary: phn msg   Phone Note Call from Patient Call back at Home Phone [phone]   Caller: Patient Summary of Call: pt would like to see Dr Raymondo Band before she goes to [facility] doctor on 7/11 and [facility] is full on the 7th.  need permission to double book. Initial call taken by: Audie Clear,  August 28, 2009 2:57 PM  Follow-up for Phone Call        May schedule in [facility] on 7/5 at 9 am and have them page McDiarmid to see [name]. Follow-up by: Sherren Mocha McDiarmid MD,  August 28, 2009 4:36 PM

## 2010-03-30 NOTE — Assessment & Plan Note (Signed)
Summary: f/u visit/bmc   Vital Signs:  Patient profile:   77 year old female Height:      63.5 inches Weight:      182 pounds BMI:     31.85 BSA:     1.87 Temp:     98.5 degrees F Pulse rate:   83 / minute BP sitting:   129 / 83  Vitals Entered By: Christen Bame CMA (March 16, 2009 11:30 AM) CC: f/u knee pain and hypertension Is Patient Diabetic? No Pain Assessment Patient in pain? no        Primary Care Provider:  TODD MCDIARMID MD  CC:  f/u knee pain and hypertension.  History of Present Illness: Left knee pain Still with pain in knee. Two episodes of left knee giving way spontaneously Tramadol 1 tablet does decrease knee pain, but it does not cause confusion, unsteady, nor dizziness. [name] planning to talk with Dr Maureen Ralphs (Ortho) about proceeding forward with Left knee replacement.   HYPERTENSION Disease Monitoring   Blood pressure range:not checking at home   Chest pain:none   Dyspnea:none   Claudication:none  Medications   Compliance:yes ROS in HPI Medication listed and updated in medication list. Smoking status documented by nursing and reviewed.   Side effects   Lightheadedness:no   Urinary frequency:no   Edema:no     Prevention   Exercise:not taking exercise secondart to left knee pain   Diet pattern: lost 5 pounds intentionally thru diet   Salt restriction:yes. HPDP: [name] reports that she will go arrange her own screening mammogram at [facility] soon.      Contraindications/Deferment of Procedures/Staging:    Test/Procedure: PAP Smear    Reason for deferment: not indicated   Habits & Providers  Alcohol-Tobacco-Diet     Tobacco Status: never  Current Medications (verified): 1)  Advair Diskus 250-50 Mcg/dose Misc (Fluticasone-Salmeterol) .... Inhale 1 Puff As Directed Twice A Day 2)  Aspirin 325 Mg Tabs (Aspirin) .Marland Kitchen.. 1 Tablet By Mouth Daily 3)  Ativan 0.5 Mg Tabs (Lorazepam) .... Take 1to 2  Tablets By Mouth Every  Eight Hours, As Needed For Anxiety or Shortness of Breath 4)  Lasix 40 Mg Tabs (Furosemide) .... Take 1 Tablet By Mouth Once A Day As Needed For Leg Swelling 5)  Tums E-X 750 Mg Chew (Calcium Carbonate Antacid) .... Take 1 Tablet Twice A Day With Meals 6)  Nitrostat 0.4 Mg  Subl (Nitroglycerin) .Marland Kitchen.. 1 Tablet Under Your Tongue Every Five Minutes As Needed For Chest Pain. Disp:25 Tab 7)  Spiriva Handihaler 18 Mcg Caps (Tiotropium Bromide Monohydrate) .... Inhale One Powder of One Capsule Once Daily. 8)  Ventolin Hfa 108 (90 Base) Mcg/act Aers (Albuterol Sulfate) .... 2 Sprays Inhaled Four Times A Day, If Needed For Shortness of Breath. Disp:1 Mdi. Refill: 5. 9)  Omeprazole 40 Mg Cpdr (Omeprazole) .... One Tablet By Mouth Once A Day 10)  Simvastatin 40 Mg Tabs (Simvastatin) .Marland Kitchen.. 1 Tablet By Mouth Once A Day At Bedtime 11)  Flexeril 5 Mg Tabs (Cyclobenzaprine Hcl) .... One Tablet By Mouth Three Times A Day As Needed 12)  Multivitamins   Tabs (Multiple Vitamin) .Marland Kitchen.. 1 Tablet By Mouth Daily 13)  Metoprolol-Hydrochlorothiazide 50-25 Mg Tabs (Metoprolol-Hydrochlorothiazide) .... Half Tablet By Mouth Twice A Day 14)  Tramadol Hcl 50 Mg Tabs (Tramadol Hcl) .Marland Kitchen.. 1 Tablet Four Times A Day As Needed For Pain  Allergies (verified): 1)  Codeine Phosphate (Codeine Phosphate) 2)  * Cox 2 Inhibitors 3)  Singulair (Montelukast Sodium) 4)  * Sotalol  Past History:  Past medical, surgical, family and social histories (including risk factors) reviewed for relevance to current acute and chronic problems.  Past Medical History: Reviewed history from 11/10/2008 and no changes required. Asthmatic Bronchitis, Chronic, COPD, Stage 3 (05/06), PFT(09/06) at [facility] (Dr. Casper Harrison, [place]):  FEV1 61%; FVC 77%, nl diffusion capacity c/w moderate COPD.  Post-Bronchodilator FEV1 increased 19%    Hosp`n 05/07 - Shingles Chest Pain, Echo-Nl EF, Nl LV, No wall motion abnl -07/25/2003, ETT 11/98; poor aerobic fitness ETT  11/98; adeq, low prob of obstructive CAD - 08/28/2001  Pacemaker placement, dual chamber placed 03/05/2003 (Dr Lovena Le) Hospitalized for NSTEMI ( 10/02/2008) PCI (10/02/2008, Dr Daneen Schick): S/P NSTEMI,  RCA [place] placed: 99% lesion to 0% lesion. Circumflex Obtuse marginal with 90% stenosis (left for medical mangement for now)  Hospitalization (9/8-9/10, Dr Daneen Schick, III, Cardiology) for Paroxysmal Atrial Fibrillation with RVR and anginal pain secondary to demand/supply mismatch in setting of RVR with known circumflex artery branch disease.    Left adrenal mass by 6/99 CT, stable for many years - incidentaloma, Adrenal adenoma, left, stable on CT 2007  Only one Kidney - surgery transitional cell cancer, ONLY ONE KIDNEY!!!,   Surveillance colonoscopy 2005 by Dr. Cristina Gong found colonic adenomatous polyps Hernia, umbilical  Central HNP w/ L4&5 root encroachment - 05/24/2001,   Spinal Stenosis: L-S MRI: L4-5 Spinal stenosis, - 03/03/2006, Spondylolithesis L5-S1(mild), DJD L4-5 on X-Ray 11/04 R. Hip Xray Unremarkable - 04/19/2001 Right heel spur 11/04  Has medication nebulizer device at home Vocal Polp excised by Dr. Purcell Nails (ENT) "many years ago"  Past Surgical History: Reviewed history from 02/02/2009 and no changes required. Bone Scan-Whole Body: L5 face degeneration , bilateral knee joint degeneration - 01/30/2003,  Cardiolite: No evidence ischemia, EF68% - 09/29/2002,   Cholecystectomy H/O Ruptured Appendix    Nephrectomy for Transitional Cell Cancer Dr. Leander Rams -, 05/24/2001. Surveillance Cystoscopy was WNL by Dr. Leander Rams - 06/11/2001  Right Knee Replacement (Dr Wynelle Link) 2009       Pacemaker lead extraction & reinsertion - 07/25/2003    Family History: Reviewed history from 06/18/2007 and no changes required. Father with MI age<55 yrs. Mother died age 93 years-old. Mother with Dementia of Alzheimers' Type.  Social History: Reviewed history from 12/21/2006 and no changes  required. Widow of [employer] judge,  Lives alone but 4 dgts who are very involved;  one grandpuppy "Molly.".  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   Pt has home nebulizer for inhalation therapies. Former Smoker Smoking Status:  never  Review of Systems CV:  Denies difficulty breathing at night and palpitations. Tammy:  Denies joint redness and joint swelling.  Physical Exam  General:  alert and well-nourished.  obese. groomed. NAD. Lungs:  normal respiratory effort and normal breath sounds.   Heart:  normal rate, regular rhythm, no murmur, no gallop, and no JVD.   Msk:  Left knee with no peripatellar edema or erythema Extremities:  trace left pedal edema and trace right pedal edema.     Impression & Recommendations:  Problem # 1:  KNEE PAIN, LEFT, ACUTE [id number])  [name] has decided to discuss knee replacement with Dr Maureen Ralphs (ortho).  She had already been advised that she is a candidate for this intervention, but was hesitant to pursue it until recently.  With the unpredictable knee giving way with subsequent falling, she wants to revisit the knee replacement option.   With [name] permission,  I cancelled her appointment with [facility] given her decision to discuss surgical intervention with Dr Maureen Ralphs. [name] may increase her Tramadol from one to two tablets every 6 hours as needed for pain.   Her updated medication list for this problem includes:    Aspirin 325 Mg Tabs (Aspirin) .Marland Kitchen... 1 tablet by mouth daily    Flexeril 5 Mg Tabs (Cyclobenzaprine hcl) ..... One tablet by mouth three times a day as needed    Tramadol Hcl 50 Mg Tabs (Tramadol hcl) .Marland Kitchen... 1 to 2  tablets four times a day as needed for pain  Orders: [place]- Est Level  3 ([id number])  Problem # 2:  EDEMA-LEGS,DUE TO VENOUS OBSTRUCT. (ICD-459.2) Assessment: Improved Adequate control. Tolerating medication. Plan to continue current medication. Rx Lasix 40 mg daily as needed . Take KCl 20 mEq by mouth  daily whenever takes Lasix.   Problem # 3:  HYPERTENSION, BENIGN ESSENTIAL (ICD-401.1) Assessment: Unchanged  Adequate control. Tolerating medication. No new organ damage. Plan to continue current medication.  Her updated medication list for this problem includes:    Lasix 40 Mg Tabs (Furosemide) .Marland Kitchen... Take 1 tablet by mouth once a day as needed for leg swelling    Metoprolol-hydrochlorothiazide 50-25 Mg Tabs (Metoprolol-hydrochlorothiazide) ..... Half tablet by mouth twice a day  Orders: [place]- Est Level  3 [id number])  Problem # 4:  Screening Breast Cancer (ICD-V76.10) Assessment: Comment Only Patient advised to set up appointment for screening mammogram at [facility].   Complete Medication List: 1)  Advair Diskus 250-50 Mcg/dose Misc (Fluticasone-salmeterol) .... Inhale 1 puff as directed twice a day 2)  Aspirin 325 Mg Tabs (Aspirin) .Marland Kitchen.. 1 tablet by mouth daily 3)  Ativan 0.5 Mg Tabs (Lorazepam) .... Take 1to 2  tablets by mouth every eight hours, as needed for anxiety or shortness of breath 4)  Lasix 40 Mg Tabs (Furosemide) .... Take 1 tablet by mouth once a day as needed for leg swelling 5)  Tums E-x 750 Mg Chew (Calcium carbonate antacid) .... Take 1 tablet twice a day with meals 6)  Nitrostat 0.4 Mg Subl (Nitroglycerin) .Marland Kitchen.. 1 tablet under your tongue every five minutes as needed for chest pain. disp:25 tab 7)  Spiriva Handihaler 18 Mcg Caps (Tiotropium bromide monohydrate) .... Inhale one powder of one capsule once daily. 8)  Ventolin Hfa 108 (90 Base) Mcg/act Aers (Albuterol sulfate) .... 2 sprays inhaled four times a day, if needed for shortness of breath. disp:1 mdi. refill: 5. 9)  Omeprazole 40 Mg Cpdr (Omeprazole) .... One tablet by mouth once a day 10)  Simvastatin 40 Mg Tabs (Simvastatin) .Marland Kitchen.. 1 tablet by mouth once a day at bedtime 11)  Flexeril 5 Mg Tabs (Cyclobenzaprine hcl) .... One tablet by mouth three times a day as needed 12)  Multivitamins Tabs (Multiple  vitamin) .Marland Kitchen.. 1 tablet by mouth daily 13)  Metoprolol-hydrochlorothiazide 50-25 Mg Tabs (Metoprolol-hydrochlorothiazide) .... Half tablet by mouth twice a day 14)  Tramadol Hcl 50 Mg Tabs (Tramadol hcl) .Marland Kitchen.. 1 to 2  tablets four times a day as needed for pain 15)  Klor-con M20 20 Meq Cr-tabs (Potassium chloride crys cr) .... Take 1 tablet by mouth on days you take lasix. Prescriptions: LASIX 40 MG TABS (FUROSEMIDE) Take 1 tablet by mouth once a day as needed for leg swelling  #30 Tablet x 5   Entered and Authorized by:   Sherren Mocha McDiarmid MD   Signed by:   Sherren Mocha McDiarmid MD on 03/16/2009  Method used:   Electronically to        [facility] (retail)       [address]       [place], [place]  [address]       Ph: [phone] or [phone]       Fax: [phone]   RxID:   [id number] KLOR-CON M20 20 MEQ CR-TABS (POTASSIUM CHLORIDE CRYS CR) Take 1 tablet by mouth on days you take Lasix.  #30 x 11   Entered and Authorized by:   Sherren Mocha McDiarmid MD   Signed by:   Sherren Mocha McDiarmid MD on 03/16/2009   Method used:   Electronically to        [facility]* (retail)       [address]       [place], [place]  [address]       Ph: [phone] or [phone]       Fax: [phone]   RxID:   [id number]    Prevention & Chronic Care Immunizations   Influenza vaccine: Fluvax 3+  (12/01/2008)   Influenza vaccine due: 11/25/2008    Tetanus booster: 05/30/2003: Done.   Tetanus booster due: 05/29/2013    Pneumococcal vaccine: Done.  (02/29/2000)   Pneumococcal vaccine due: None    H. zoster vaccine: Not documented  Colorectal Screening   Hemoccult: Done.  (12/29/1996)   Hemoccult due: Not Indicated    Colonoscopy: abnormal  (10/24/2007)   Colonoscopy due: 10/23/2017  Other Screening   Pap smear: Advised  (10/11/2007)   Pap smear action/deferral: not indicated  (03/16/2009)   Pap smear due: 10/10/2008    Mammogram:  Normal  (11/16/2007)   Mammogram action/deferral: Ordered  (02/02/2009)   Mammogram due: 11/15/2008    DXA bone density scan: Not documented   DXA scan due: Not Indicated    Smoking status: never  (03/16/2009)  Lipids   Total Cholesterol: 87  (11/06/2008)   LDL: 48  (11/06/2008)   LDL Direct: Not documented   HDL: 25  (11/06/2008)   Triglycerides: 70  (11/06/2008)    SGOT (AST): 16  (10/08/2008)   SGPT (ALT): 12  (10/08/2008)   Alkaline phosphatase: 82  (10/08/2008)   Total bilirubin: 1.3  (10/08/2008)    Lipid flowsheet reviewed?: Yes   Progress toward LDL goal: At goal  Hypertension   Last Blood Pressure: 129 / 83  (03/16/2009)   Serum creatinine: 1.10  (11/12/2008)   Serum potassium 4.5  (11/12/2008)    Hypertension flowsheet reviewed?: Yes   Progress toward BP goal: At goal  Self-Management Support :   Personal Goals (by the next clinic visit) :      Personal blood pressure goal: 140/90  (12/01/2008)     Personal LDL goal: 100  (12/01/2008)    Hypertension self-management support: Written self-care plan, Education handout  (12/01/2008)    Hypertension self-management support not done because: Good outcomes  (03/16/2009)    Lipid self-management support: Not documented     Lipid self-management support not done because: Good outcomes  (03/16/2009)

## 2010-03-31 ENCOUNTER — Other Ambulatory Visit: Payer: Self-pay | Admitting: Family Medicine

## 2010-03-31 ENCOUNTER — Encounter: Payer: Self-pay | Admitting: Family Medicine

## 2010-03-31 DIAGNOSIS — M4316 Spondylolisthesis, lumbar region: Secondary | ICD-10-CM | POA: Insufficient documentation

## 2010-04-01 ENCOUNTER — Telehealth: Payer: Self-pay | Admitting: Family Medicine

## 2010-04-01 NOTE — Assessment & Plan Note (Signed)
Summary: [name]/WRIST PAIN/KH   Vital Signs:  Patient profile:   77 year old female Weight:      168.2 pounds O2 Sat:      96 % on Room air Temp:     98.1 degrees F Pulse rate:   88 / minute BP sitting:   126 / 78  O2 Flow:  Room air  Primary Care Provider:  Ruta Capece MD   History of Present Illness: Left Wrist pain Pt accompanied by Hassell Done during interview and exam. Onset: late afternoon of 03/12/10.   Progressively worsened over last two days Throbbing pain, 10/10 pain now. Pain interfered with sleeping last night Worse with wrist/hand hanging down at waist.  Took dgt's percocet that helped her fall asleep last night.  Denies confusion after taking percocet.  No radiation No fever/chills/nausa/vomiting No trauma Tammy wrist/arms recalled. No history of gout or pseudogout taking HCTZ for htn.  No acute pain in any other joint   Habits & Providers  Alcohol-Tobacco-Diet     Alcohol drinks/day: <1     Tobacco Status: quit     Tobacco Counseling: Tammy remain off tobacco products  Current Medications (verified): 1)  Advair Diskus 250-50 Mcg/dose Misc (Fluticasone-Salmeterol) .... Inhale 1 Puff As Directed Twice A Day 2)  Aspirin 325 Mg Tabs (Aspirin) .... Half Tablet By Mouth Daily 3)  Ativan 0.5 Mg Tabs (Lorazepam) .... Take 1to 2  Tablets By Mouth Every Eight Hours, As Needed For Anxiety or Shortness of Breath 4)  Tums E-X 750 Mg Chew (Calcium Carbonate Antacid) .... Take 1 Tablet Twice A Day With Meals 5)  Nitrostat 0.4 Mg  Subl (Nitroglycerin) .Marland Kitchen.. 1 Tablet Under Your Tongue Every Five Minutes As Needed For Chest Pain. Disp:25 Tab 6)  Spiriva Handihaler 18 Mcg Caps (Tiotropium Bromide Monohydrate) .... Inhale One Powder of One Capsule Once Daily. 7)  Ventolin Hfa 108 (90 Base) Mcg/act Aers (Albuterol Sulfate) .... 2 Sprays Inhaled Four Times A Day, If Needed For Shortness of Breath. Disp:1 Mdi. Refill: 5. 8)  Omeprazole 40 Mg Cpdr (Omeprazole) .... One  Tablet By Mouth Once A Day 9)  Simvastatin 40 Mg Tabs (Simvastatin) .Marland Kitchen.. 1 Tablet By Mouth Once A Day At Bedtime 10)  Multivitamins   Tabs (Multiple Vitamin) .Marland Kitchen.. 1 Tablet By Mouth Daily 11)  Tramadol Hcl 50 Mg Tabs (Tramadol Hcl) .Marland Kitchen.. 1 Tammy 2  Tablets Four Times A Day As Needed For Pain 12)  Metoprolol Tartrate 50 Mg Tabs (Metoprolol Tartrate) .... Half Tablet By Mouth Twice A Day 13)  Vitamin B-12 1000 Mcg Tabs (Cyanocobalamin) .... One Tablet By Mouth Daily. 14)  Azithromycin 250 Mg Tabs (Azithromycin) .... Take 2 Tablets By Mouth Initially, Then 1 Tablet Daily For 4 Additional Days.  Patient May Self-Initiate Tx. 15)  Hydrochlorothiazide 12.5 Mg Caps (Hydrochlorothiazide) .... Take One Capsule By Mouth Daily For High Blood Pressure 16)  Percocet 5-325 Mg Tabs (Oxycodone-Acetaminophen) .... Take One Tablet By Mouth Every 6 Hours As Needed For Wrist Pain 17)  Prednisone 20 Mg Tabs (Prednisone) .... Take Two Tablets A Day For 5 Days After Wrist Pain Resolve  Allergies (verified): 1)  Codeine Phosphate (Codeine Phosphate) 2)  * Cox 2 Inhibitors 3)  Singulair (Montelukast Sodium) 4)  * Sotalol PMH reviewed for relevance  Review of Systems CV:  Complains of swelling of hands; denies palpitations. Resp:  Denies cough and wheezing. MS:  Complains of joint pain, joint redness, and joint swelling.  Physical Exam  General:  Fatigued appearing, Alert, cooperative, holding left wrist up against chest with right hand.  Groomed.   Lungs:  normal respiratory effort, no accessory muscle use, normal breath sounds, no crackles, and no wheezes.   Heart:  normal rate, regular rhythm, no murmur, and no gallop.   Msk:  left wrist dorsum: erythematous, warm Tammy touch, tender Tammy light touch. mild edema on left wrist dorsum compared or right wrist dorsum Pain with active and passive wrist flexion and extension. Pain with MCP grinds x 5 No obvious boney deformity Exam limited by tenderness   Pulses:  L  radial normal.   Extremities:  No peripheral edema of legs/ankles Neurologic:  right hand intact Tammy touch in median, ulnar and radial nerve distribution    Impression & Recommendations:  Problem # 1:  ARTHRITIS, LEFT WRIST (ICD-716.93) Working diagnosis for acute monoarticular arthritis crystal-induced arthritis (gout, pseudogout), doubt septic arthritis given lack of SIR findings.  Plan: Prednisone 40 mg daily for 5 days beyond resolution of pain, Percocet 5/325 every 6 hours if needed. Elevate and Ice wrist. Stop HCTZ. Contact patient in am Tammy see if improving.  If not improved will need Tammy image wrist. Added Uric Acid Tammy blood draw from 03/15/10.  Orders: [facility]ric [facility] [id number])  Complete Medication List: 1)  Advair Diskus 250-50 Mcg/dose Misc (Fluticasone-salmeterol) .... Inhale 1 puff as directed twice a day 2)  Aspirin 325 Mg Tabs (Aspirin) .... Half tablet by mouth daily 3)  Ativan 0.5 Mg Tabs (Lorazepam) .... Take 1to 2  tablets by mouth every eight hours, as needed for anxiety or shortness of breath 4)  Tums E-x 750 Mg Chew (Calcium carbonate antacid) .... Take 1 tablet twice a day with meals 5)  Nitrostat 0.4 Mg Subl (Nitroglycerin) .Marland Kitchen.. 1 tablet under your tongue every five minutes as needed for chest pain. disp:25 tab 6)  Spiriva Handihaler 18 Mcg Caps (Tiotropium bromide monohydrate) .... Inhale one powder of one capsule once daily. 7)  Ventolin Hfa 108 (90 Base) Mcg/act Aers (Albuterol sulfate) .... 2 sprays inhaled four times a day, if needed for shortness of breath. disp:1 mdi. refill: 5. 8)  Omeprazole 40 Mg Cpdr (Omeprazole) .... One tablet by mouth once a day 9)  Simvastatin 40 Mg Tabs (Simvastatin) .Marland Kitchen.. 1 tablet by mouth once a day at bedtime 10)  Multivitamins Tabs (Multiple vitamin) .Marland Kitchen.. 1 tablet by mouth daily 11)  Tramadol Hcl 50 Mg Tabs (Tramadol hcl) .Marland Kitchen.. 1 Tammy 2  tablets four times a day as needed for pain 12)  Metoprolol Tartrate 50 Mg Tabs (Metoprolol  tartrate) .... Half tablet by mouth twice a day 13)  Vitamin B-12 1000 Mcg Tabs (Cyanocobalamin) .... One tablet by mouth daily. 14)  Azithromycin 250 Mg Tabs (Azithromycin) .... Take 2 tablets by mouth initially, then 1 tablet daily for 4 additional days.  patient may self-initiate tx. 15)  Hydrochlorothiazide 12.5 Mg Caps (Hydrochlorothiazide) .... Take one capsule by mouth daily for high blood pressure 16)  Percocet 5-325 Mg Tabs (Oxycodone-acetaminophen) .... Take one tablet by mouth every 6 hours as needed for wrist pain 17)  Prednisone 20 Mg Tabs (Prednisone) .... Take two tablets a day for 5 days after wrist pain resolve  Other Orders: [place]- Est Level  3 [id number])  Patient Instructions: 1)  I think your wrist pain is from gout or pseudogout. 2)  Keep your wrist at the level of your heart 3)  Keep ice on wrist 4)  Take Prednisone 20 mg  tablets, two tablets now then two tablets daily for 5 days beyond resolution of wrist pain 5)  Dr Sherita Decoste will call you in the morning Tammy see how you are doing. 6)  Stop HCTZ (hydrochlorothiazide). Prescriptions: PREDNISONE 20 MG TABS (PREDNISONE) Take two tablets a day for 5 days after wrist pain resolve  #20 x 0   Entered and Authorized by:   Sherren Mocha Quaneshia Wareing MD   Signed by:   Sherren Mocha Coulton Schlink MD on 03/15/2010   Method used:   Electronically Tammy        [id number]* (retail)       [address], [place]  [address]       Ph: [phone]       Fax: [phone]   RxID:   [id number] PERCOCET 5-325 MG TABS (OXYCODONE-ACETAMINOPHEN) Take one tablet by mouth every 6 hours as needed for wrist pain  #20 x 0   Entered and Authorized by:   Sherren Mocha Burr Soffer MD   Signed by:   Sherren Mocha Paisli Silfies MD on 03/15/2010   Method used:   Print then Give Tammy Patient   RxID:   [id number]    Orders Added: 1)  Uric Acid-[facility] [id number] 2)  [facility]- Est Level  3 [id number]

## 2010-04-01 NOTE — Progress Notes (Signed)
Summary: Wrist Paind and swelling tx follow up call   Phone Note Outgoing Call   Call placed by: Sherren Mocha Nabria Nevin MD,  March 16, 2010 10:10 AM Call placed to: Patient Summary of Call: Wrist is much improved.  Still a little swelling by able to use left hand now.  Plans to continue prednisone for 5 days then stop.  She knows to stop the HCTZ. If the wrist start to hurt when she stops the prednisone, she is to restart her prednisone and notify Dr Tresa Jolley.  While her serum uric acid was not elevated, this is still likely a crystal arthropathy (gout Vs pseudogout).   Initial call taken by: Sherren Mocha Bentleigh Stankus MD,  March 16, 2010 10:15 AM  New Problems: ACUTE GOUTY ARTHROPATHY (ICD-274.01)   New Problems: ACUTE GOUTY ARTHROPATHY (ICD-274.01)

## 2010-04-01 NOTE — Progress Notes (Signed)
Summary: Lab request   Phone Note Call from Patient Call back at Home Phone [phone]   Summary of Call: pt wants to know MD can squeeze her in another day since he is booked until feb. needs to f/u  Initial call taken by: Samara Snide,  March 11, 2010 2:42 PM  Follow-up for Phone Call        Mrs Albelo is having muscle cramps. Was told by Dr Maureen Ralphs and Dr Casper Harrison to get her electrolytes checked.  Ms Latham will come into [facility] lab in morning for BMET, TSH, CPK, Magnesium Follow-up by: Sherren Mocha Maisey Deandrade MD,  March 11, 2010 4:00 PM  New Problems: MUSCLE CRAMPS (ICD-729.82)   New Problems: MUSCLE CRAMPS (ICD-729.82)

## 2010-04-01 NOTE — Miscellaneous (Signed)
Summary: SOB, "not well" while getting labs   Clinical Lists Changes here for labs states she started getting more SOB yesterday after visit to pulmonary md in [place]. worse this pm. states she does not feel well. also dizzy. has a ride here today. O2 98% 114/65 p.73. has not taken a neb rx today. wants to do this instead of going to UC or ED. agreed. told her she must go to ED if not better. she agreed.[name] RN  March 12, 2010 4:25 PM

## 2010-04-01 NOTE — Progress Notes (Signed)
   Phone Note Outgoing Call   Call placed by: Sherren Mocha Emmylou Bieker MD,  March 15, 2010 10:22 AM Call placed to: Patient Summary of Call: Mrs Watchman has had acute onset of wrist pain.  No known trauma.  Asked her to come into [facility] now. Page Dr Brina Umeda to see Initial call taken by: Sherren Mocha Penda Venturi MD,  March 15, 2010 10:23 AM

## 2010-04-01 NOTE — Consult Note (Signed)
Summary: [facility]   Imported By: Samara Snide 03/25/2010 11:01:02  _____________________________________________________________________  External Attachment:    Type:   Image     Comment:   External Document

## 2010-04-02 ENCOUNTER — Telehealth: Payer: Self-pay | Admitting: Family Medicine

## 2010-04-05 ENCOUNTER — Other Ambulatory Visit (INDEPENDENT_AMBULATORY_CARE_PROVIDER_SITE_OTHER): Payer: Medicare Other

## 2010-04-05 ENCOUNTER — Encounter: Payer: Self-pay | Admitting: Family Medicine

## 2010-04-05 ENCOUNTER — Other Ambulatory Visit: Payer: Self-pay | Admitting: Urology

## 2010-04-05 DIAGNOSIS — D649 Anemia, unspecified: Secondary | ICD-10-CM

## 2010-04-05 DIAGNOSIS — D508 Other iron deficiency anemias: Secondary | ICD-10-CM

## 2010-04-05 LAB — CONVERTED CEMR LAB
Basophils Absolute: 0.1 10*3/uL (ref 0.0–0.1)
Basophils Relative: 1 % (ref 0–1)
Eosinophils Relative: 5 % (ref 0–5)
HCT: 25.6 % — ABNORMAL LOW (ref 36.0–46.0)
Hemoglobin: 7.1 g/dL — ABNORMAL LOW (ref 12.0–15.0)
MCHC: 27.7 g/dL — ABNORMAL LOW (ref 30.0–36.0)
MCV: 85 fL (ref 78.0–100.0)
Monocytes Absolute: 0.4 10*3/uL (ref 0.1–1.0)
Monocytes Relative: 6 % (ref 3–12)
RBC: 3.01 M/uL — ABNORMAL LOW (ref 3.87–5.11)
RDW: 18.6 % — ABNORMAL HIGH (ref 11.5–15.5)

## 2010-04-06 ENCOUNTER — Telehealth: Payer: Self-pay | Admitting: Family Medicine

## 2010-04-06 LAB — CBC WITH DIFFERENTIAL/PLATELET
Basophils Absolute: 0.1 10*3/uL (ref 0.0–0.1)
Basophils Relative: 1 % (ref 0–1)
Eosinophils Absolute: 0.3 10*3/uL (ref 0.0–0.7)
Eosinophils Relative: 5 % (ref 0–5)
Lymphocytes Relative: 23 % (ref 12–46)
MCHC: 27.7 g/dL — ABNORMAL LOW (ref 30.0–36.0)
MCV: 85 fL (ref 78.0–100.0)
Platelets: 315 10*3/uL (ref 150–400)
RDW: 18.6 % — ABNORMAL HIGH (ref 11.5–15.5)
WBC: 6.7 10*3/uL (ref 4.0–10.5)

## 2010-04-07 NOTE — Progress Notes (Signed)
Summary: Check in with patient   Phone Note Outgoing Call   Call placed by: Sherren Mocha Xxavier Noon MD,  April 02, 2010 11:43 AM Call placed to: Patient Summary of Call: I spoke with Mrs Panos.  No chest pain, No worsening shortness of breath.  No near-synope or syncope.   Does report a one minute episode of "Afib" after climbing stairs last night. Tolerating three times a day Ferrous Sulfate. Hemoccult cards have not yet arrived at her home through the mail.   Recommended that if "Afib" experience becomes frequent or persistent, or if symptoms mentioned above occur, patient should come to ED for admission and PRBC transfusion. Otherwise, pt to come by [facility] on Monday for HgB and retic count monitoring check.  Continue three times a day ferrous sulfate by mouth.   If hemoccult cards do not arrive at patients home, will need to check rectal DRE hemoccult when patient comes in for blood work on Monday.  If other symptoms mentioned ab    Initial call taken by: Sherren Mocha Thuan Tippett MD,  April 02, 2010 11:59 AM

## 2010-04-07 NOTE — Progress Notes (Signed)
   Phone Note Call from Patient   Caller: Patient Summary of Call: Please look at your schedule for the 21st of November and tell me if you can fit Mrs. Deerman in.  I am suppose to call her and let her know.  She just want to discuss some things regarding meds she's taking and to inform you that she has had her knee surgery. Initial call taken by: Eusebio Friendly,  December 15, 2009 9:18 AM  Follow-up for Phone Call        It is OK to [facility] Castelan in my clinic on Nov 21. Thank you. Follow-up by: Sherren Mocha Bryant Saye MD,  December 15, 2009 11:41 AM

## 2010-04-07 NOTE — Assessment & Plan Note (Signed)
Summary: to see Claris Guymon @ 2:00,df   Vital Signs:  Patient profile:   77 year old female Height:      64 inches Weight:      169.1 pounds O2 Sat:      96 % on Room air Temp:     97.8 degrees F Pulse rate:   84 / minute BP sitting:   160 / 66  (left arm)  Vitals Entered By: Marcell Barlow RN (March 29, 2010 2:16 PM)  O2 Flow:  Room air CC: trouble with breathing Is Patient Diabetic? No Pain Assessment Patient in pain? no        Primary Care Provider:  Roselie Cirigliano MD  CC:  trouble with breathing.  History of Present Illness: Onset about 6 months ago with slow progression until about one montha go when accelerated preogress. SOB with walking 100 feet now or climbing one flight of stairs. .  No cough, no fever, no sputum increase. Took Z-pak about two weeks ago for episoden of increase cough with increase purulent sputum.  Cough and sputum px resolved but DOE did not.  No chest pain, no orthopnea, Sleeps with 2 pillow (chronically)  (+) bilateral leg edema per patient that she took one lasix tablet yesterday with good UOP but no change in DOE.   Took Prednisone 40 mg daily couple weeks ago for gout flare with no improvement in DOE No new medications.  No melena nor hematochezia. No hx of Heart Failure.  (+) Hx pAFIB, S/P PCI (10/02/2008, Dr Daneen Schick) after NSTEMI with RCA BARE METAL STENT CAD remaining Circumflex Obtuse marginal with 90% stenosis (left for medical mangement for)    Habits & Providers  Alcohol-Tobacco-Diet     Alcohol drinks/day: <1     Tobacco Status: quit  Current Medications (verified): 1)  Advair Diskus 250-50 Mcg/dose Misc (Fluticasone-Salmeterol) .... Inhale 1 Puff As Directed Twice A Day 2)  Aspirin 325 Mg Tabs (Aspirin) .... Half Tablet By Mouth Daily 3)  Ativan 0.5 Mg Tabs (Lorazepam) .... Take 1to 2  Tablets By Mouth Every Eight Hours, As Needed For Anxiety or Shortness of Breath 4)  Tums E-X 750 Mg Chew (Calcium Carbonate Antacid)  .... Take 1 Tablet Twice A Day With Meals 5)  Nitrostat 0.4 Mg  Subl (Nitroglycerin) .Marland Kitchen.. 1 Tablet Under Your Tongue Every Five Minutes As Needed For Chest Pain. Disp:25 Tab 6)  Spiriva Handihaler 18 Mcg Caps (Tiotropium Bromide Monohydrate) .... Inhale One Powder of One Capsule Once Daily. 7)  Ventolin Hfa 108 (90 Base) Mcg/act Aers (Albuterol Sulfate) .... 2 Sprays Inhaled Four Times A Day, If Needed For Shortness of Breath. Disp:1 Mdi. Refill: 5. 8)  Omeprazole 40 Mg Cpdr (Omeprazole) .... One Tablet By Mouth Once A Day 9)  Simvastatin 40 Mg Tabs (Simvastatin) .Marland Kitchen.. 1 Tablet By Mouth Once A Day At Bedtime 10)  Multivitamins   Tabs (Multiple Vitamin) .Marland Kitchen.. 1 Tablet By Mouth Daily 11)  Ultracet 37.5-325 Mg Tabs (Tramadol-Acetaminophen) .Marland Kitchen.. 1-2 Tablet By Mouth Every 6 To 8 Hours As Needed Pain 12)  Metoprolol Tartrate 50 Mg Tabs (Metoprolol Tartrate) .... Half Tablet By Mouth Twice A Day 13)  Vitamin B-12 1000 Mcg Tabs (Cyanocobalamin) .... One Tablet By Mouth Daily. 14)  Azithromycin 250 Mg Tabs (Azithromycin) .... Take 2 Tablets By Mouth Initially, Then 1 Tablet Daily For 4 Additional Days.  Patient May Self-Initiate Tx. 15)  Hydrochlorothiazide 12.5 Mg Caps (Hydrochlorothiazide) .... Take One Capsule By Mouth Daily For High Blood  Pressure 16)  Percocet 5-325 Mg Tabs (Oxycodone-Acetaminophen) .... Take One Tablet By Mouth Every 6 Hours As Needed For Wrist Pain 17)  Prednisone 20 Mg Tabs (Prednisone) .... Take Two Tablets A Day For 5 Days After Wrist Pain Resolve 18)  Klor-Con M20 20 Meq Cr-Tabs (Potassium Chloride Crys Cr) .Marland Kitchen.. 1 Tablet By Mouth As Needed Taking Lasix 19)  Furosemide 40 Mg Tabs (Furosemide) .Marland Kitchen.. 1 Tablet Daily As Needed Leg Edema 20)  Lorazepam 0.5 Mg Tabs (Lorazepam) .Marland Kitchen.. 1 To 2 Tablet By Mouth Every 8 Hours Prn  Allergies (verified): 1)  Codeine Phosphate (Codeine Phosphate) 2)  * Cox 2 Inhibitors 3)  Singulair (Montelukast Sodium) 4)  * Sotalol  Past History:  Past  Medical History: Last updated: 11/10/2008 Asthmatic Bronchitis, Chronic, COPD, Stage 3 (05/06), PFT(09/06) at [facility] (Dr. Casper Harrison, [place]):  FEV1 61%; FVC 77%, nl diffusion capacity c/w moderate COPD.  Post-Bronchodilator FEV1 increased 19%    Hosp`n 05/07 - Shingles Chest Pain, Echo-Nl EF, Nl LV, No wall motion abnl -07/25/2003, ETT 11/98; poor aerobic fitness ETT 11/98; adeq, low prob of obstructive CAD - 08/28/2001  Pacemaker placement, dual chamber placed 03/05/2003 (Dr Lovena Le) Hospitalized for NSTEMI ( 10/02/2008) PCI (10/02/2008, Dr Daneen Schick): S/P NSTEMI,  RCA [place] placed: 99% lesion to 0% lesion. Circumflex Obtuse marginal with 90% stenosis (left for medical mangement for now)  Hospitalization (9/8-9/10, Dr Daneen Schick, III, Cardiology) for Paroxysmal Atrial Fibrillation with RVR and anginal pain secondary to demand/supply mismatch in setting of RVR with known circumflex artery branch disease.    Left adrenal mass by 6/99 CT, stable for many years - incidentaloma, Adrenal adenoma, left, stable on CT 2007  Only one Kidney - surgery transitional cell cancer, ONLY ONE KIDNEY!!!,   Surveillance colonoscopy 2005 by Dr. Cristina Gong found colonic adenomatous polyps Hernia, umbilical  Central HNP w/ L4&5 root encroachment - 05/24/2001,   Spinal Stenosis: L-S MRI: L4-5 Spinal stenosis, - 03/03/2006, Spondylolithesis L5-S1(mild), DJD L4-5 on X-Ray 11/04 R. Hip Xray Unremarkable - 04/19/2001 Right heel spur 11/04  Has medication nebulizer device at home Vocal Polp excised by Dr. Purcell Nails (ENT) "many years ago"  Past Surgical History: Last updated: 01/18/2010 Bone Scan-Whole Body: L5 face degeneration , bilateral knee joint degeneration - 01/30/2003,  Cardiolite: No evidence ischemia, EF68% - 09/29/2002,  Pacemaker lead extraction & reinsertion - 07/25/2003  Cholecystectomy H/O Ruptured Appendix    Nephrectomy for Transitional Cell Cancer Dr. Leander Rams -, 05/24/2001. Surveillance  Cystoscopy was WNL by Dr. Leander Rams - 06/11/2001  Right Knee Replacement (Dr Wynelle Link) 2009 Left Knee Arthroplasty (Dr Maureen Ralphs) 05/2009 Left knee arthroscopy and synovectomy for left knee patella clunk syndome (12/09/09, Dr  Maureen Ralphs)       Review of Systems CV:  Complains of swelling of feet; denies difficulty breathing at night, difficulty breathing while lying down, and swelling of hands. Resp:  Denies chest discomfort, cough, and wheezing. Endo:  Complains of cold intolerance.  Physical Exam  General:  alert.  NAD.  No distress at rest. VS reviewed.  Lungs:  normal respiratory effort, no intercostal retractions, no accessory muscle use, no crackles, and no wheezes.   Rest SaO2 (Room Air)=98% Exertional SaO2 (Room Air, Walk 230ft, Feeling SOB)= 98%, HR 96 BPM Heart:  normal rate and regular rhythm.  Faint 1-2 SEM LSB without radiation.  No JVD,  No HJR.  NO displaced PMI.  Abdomen:  soft and no distention.   Pulses:  R dorsalis pedis normal and L dorsalis  pedis normal.   Extremities:  trace left pedal edema and trace right pedal edema.  Right calf circ (36 cm) Left calf circ (36 cm)  Neurologic:  Normal Gait   Impression & Recommendations:  Problem # 1:  DYSPNEA ON EXERTION (ICD-786.09) Assessment New Chronic progressive with accelleration over last month.   Broad differential including Heart Failure, Anemia, Deconditioning, COPD progression, anginal equivalent  Low risk of P.E. Will check CXR, BNP, CBC,TSH, and D-Dimer.    Her updated medication list for this problem includes:    Advair Diskus 250-50 Mcg/dose Misc (Fluticasone-salmeterol) ..... Inhale 1 puff as directed twice a day    Spiriva Handihaler 18 Mcg Caps (Tiotropium bromide monohydrate) ..... Inhale one powder of one capsule once daily.    Ventolin Hfa 108 (90 Base) Mcg/act Aers (Albuterol sulfate) .Marland Kitchen... 2 sprays inhaled four times a day, if needed for shortness of breath. disp:1 mdi. refill: 5.    Metoprolol  Tartrate 50 Mg Tabs (Metoprolol tartrate) ..... Half tablet by mouth twice a day    Hydrochlorothiazide 12.5 Mg Caps (Hydrochlorothiazide) .Marland Kitchen... Take one capsule by mouth daily for high blood pressure    Furosemide 40 Mg Tabs (Furosemide) .Marland Kitchen... 1 tablet daily as needed leg edema  Orders: CXR- 2view (CXR) B Nat Peptide-[facility] [id number]) CBC-[facility] [id number]) Basic Met-[facility] [phone]) TSH-[facility] [phone]) D-Dimer- [facility] [id number]) [place]- Est  Level 4 [id number])  Complete Medication List: 1)  Advair Diskus 250-50 Mcg/dose Misc (Fluticasone-salmeterol) .... Inhale 1 puff as directed twice a day 2)  Aspirin 325 Mg Tabs (Aspirin) .... Half tablet by mouth daily 3)  Ativan 0.5 Mg Tabs (Lorazepam) .... Take 1to 2  tablets by mouth every eight hours, as needed for anxiety or shortness of breath 4)  Tums E-x 750 Mg Chew (Calcium carbonate antacid) .... Take 1 tablet twice a day with meals 5)  Nitrostat 0.4 Mg Subl (Nitroglycerin) .Marland Kitchen.. 1 tablet under your tongue every five minutes as needed for chest pain. disp:25 tab 6)  Spiriva Handihaler 18 Mcg Caps (Tiotropium bromide monohydrate) .... Inhale one powder of one capsule once daily. 7)  Ventolin Hfa 108 (90 Base) Mcg/act Aers (Albuterol sulfate) .... 2 sprays inhaled four times a day, if needed for shortness of breath. disp:1 mdi. refill: 5. 8)  Omeprazole 40 Mg Cpdr (Omeprazole) .... One tablet by mouth once a day 9)  Simvastatin 40 Mg Tabs (Simvastatin) .Marland Kitchen.. 1 tablet by mouth once a day at bedtime 10)  Multivitamins Tabs (Multiple vitamin) .Marland Kitchen.. 1 tablet by mouth daily 11)  Ultracet 37.5-325 Mg Tabs (Tramadol-acetaminophen) .Marland Kitchen.. 1-2 tablet by mouth every 6 to 8 hours as needed pain 12)  Metoprolol Tartrate 50 Mg Tabs (Metoprolol tartrate) .... Half tablet by mouth twice a day 13)  Vitamin B-12 1000 Mcg Tabs (Cyanocobalamin) .... One tablet by mouth daily. 14)  Azithromycin 250 Mg Tabs (Azithromycin) .... Take 2 tablets by mouth initially, then 1 tablet  daily for 4 additional days.  patient may self-initiate tx. 15)  Hydrochlorothiazide 12.5 Mg Caps (Hydrochlorothiazide) .... Take one capsule by mouth daily for high blood pressure 16)  Percocet 5-325 Mg Tabs (Oxycodone-acetaminophen) .... Take one tablet by mouth every 6 hours as needed for wrist pain 17)  Prednisone 20 Mg Tabs (Prednisone) .... Take two tablets a day for 5 days after wrist pain resolve 18)  Klor-con M20 20 Meq Cr-tabs (Potassium chloride crys cr) .Marland Kitchen.. 1 tablet by mouth as needed taking lasix 19)  Furosemide 40 Mg Tabs (Furosemide) .Marland Kitchen.. 1 tablet daily  as needed leg edema 20)  Lorazepam 0.5 Mg Tabs (Lorazepam) .Marland Kitchen.. 1 to 2 tablet by mouth every 8 hours prn   Orders Added: 1)  CXR- 2view [CXR] 2)  B Nat Peptide-[facility] [[id number]] 3)  CBC-[facility] [id number] 4)  Basic Met-[facility] [id number] 5)  TSH-[facility] [id number] 6)  D-Dimer- [facility] [id number] 7)  [facility]- Est  Level 4 [id number]     Echocardiogram  Procedure date:  03/03/2003  Findings:      2-D ECHO. - STATUS: Final                                            Perform Date: 3 Jan05 16:12  Ordered ByGolden Pop MD , PROVIDER         Ordered Date: 1 Jan05 09:00  Facility: [facility]                              Department: ECHO  Service Report Text  [facility]   [address]   [place], [place] [address]   [phone]    ---------------------------------------------------------------   Transthoracic Echocardiogram   Patient: - - - - - - -  -  Tammy -  -  Stewart -  -  MR Number:-  Study Date: - -   03-Mar-2003    ---------------------------------------------------------------   Gender:      F   Age:         89 years   DOB:         Nov 02, 1923   Height:   Weight:      209 lb ( 95 kg )   BSA:   Pt. Status:   Room:        2012   STAFF   ADMITTING   [facility]-     ---------------------------------------------------------------   INDICATIONS:   Assess left ventricular function.   DIAGNOSES SUPPORTING MEDICAL NECESSITY:   427.31 Atrial Fib   HISTORY:   Asthma. AFib. History Renal Cell CA. S/P Nephectomy. Chest pain. The   patient had hypertension and recent cigarette use (within one month   of this admission).    ---------------------------------------------------------------   PROCEDURE INFORMATION:   A transthoracic complete 2D study was performed. Additional   evaluation included M-mode, complete spectral Doppler, and color   Doppler. This was a routine echocardiographic study. The study was   performed at the bedside. This was a technically difficult study,   due to lungs and body habitus.    ---------------------------------------------------------------   Comments:        LEFT VENTRICLE:   -  The left ventricle was grossly normal.   -  The left ventricle was not well visualized.   -  Left ventricular wall thickness was mildly increased.   AORTIC VALVE:   -  The aortic valve was grossly normal.   -  The aortic valve was not well visualized.   MITRAL VALVE:   -  The mitral valve was grossly normal.   -  The mitral valve was not well visualized.   LEFT ATRIUM:   -  The left atrium was mildly to moderately dilated.   RIGHT VENTRICLE:   -  Right ventricular size  was normal.   -  Right ventricular systolic function was normal.   -  Right ventricular wall thickness was normal.   PULMONIC VALVE:   -  The pulmonic valve was grossly normal.   -  The pulmonic valve was not well visualized.   TRICUSPID VALVE:   -  The tricuspid valve was grossly normal.   -  The tricuspid valve was not well visualized.   RIGHT ATRIUM:   -  The right atrium was grossly normal.   -  The right atrium was not well visualized.   PERICARDIUM:   -  The pericardium was grossly normal.    ---------------------------------------------------------------    SUMMARY   -  Left ventricular wall thickness was mildly increased.   -  The left atrium was mildly to moderately dilated.    ---------------------------------------------------------------   Prepared and Electronically Authenticated by   Fransico Him M.D.   Confirmed 03-Mar-2003 17:07:53    Echocardiogram  Procedure date:  03/03/2003  Findings:      2-D ECHO. - STATUS: Final                                            Perform Date: 3 Jan05 16:12  Ordered By: Golden Pop MD , PROVIDER         Ordered Date: 1 Jan05 09:00  Facility: [facility]                              Department: ECHO  Service Report Text  [facility]   [address]   [place], [place] [address]   [phone]    ---------------------------------------------------------------   Transthoracic Echocardiogram   Patient: - - - - - - -  -  [place] -  -  [place] -  -  MR Number:-  Study Date: - -   03-Mar-2003    ---------------------------------------------------------------   Gender:      F   Age:         33 years   DOB:         1924-01-09   Height:   Weight:      209 lb ( 95 kg )   BSA:   Pt. Status:   Room:        2012   STAFF   ADMITTING   [facility]-    ---------------------------------------------------------------   INDICATIONS:   Assess left ventricular function.   DIAGNOSES SUPPORTING MEDICAL NECESSITY:   427.31 Atrial Fib   HISTORY:   Asthma. AFib. History Renal Cell CA. S/P Nephectomy. Chest pain. The   patient had hypertension and recent cigarette use (within one month   of this admission).    ---------------------------------------------------------------   PROCEDURE INFORMATION:   A transthoracic complete 2D study was performed. Additional   evaluation included M-mode, complete spectral Doppler, and color   Doppler. This was a routine echocardiographic study. The study was   performed at the  bedside. This was a technically difficult study,   due to lungs and body habitus.    ---------------------------------------------------------------   Comments:        LEFT VENTRICLE:   -  The left ventricle was grossly normal.   -  The left ventricle was not well visualized.   -  Left ventricular wall thickness was mildly increased.   AORTIC VALVE:   -  The aortic valve was grossly normal.   -  The aortic valve was not well visualized.   MITRAL VALVE:   -  The mitral valve was grossly normal.   -  The mitral valve was not well visualized.   LEFT ATRIUM:   -  The left atrium was mildly to moderately dilated.   RIGHT VENTRICLE:   -  Right ventricular size was normal.   -  Right ventricular systolic function was normal.   -  Right ventricular wall thickness was normal.   PULMONIC VALVE:   -  The pulmonic valve was grossly normal.   -  The pulmonic valve was not well visualized.   TRICUSPID VALVE:   -  The tricuspid valve was grossly normal.   -  The tricuspid valve was not well visualized.   RIGHT ATRIUM:   -  The right atrium was grossly normal.   -  The right atrium was not well visualized.   PERICARDIUM:   -  The pericardium was grossly normal.    ---------------------------------------------------------------   SUMMARY   -  Left ventricular wall thickness was mildly increased.   -  The left atrium was mildly to moderately dilated.    ---------------------------------------------------------------   Prepared and Electronically Authenticated by   Fransico Him M.D.   Confirmed 03-Mar-2003 17:07:53

## 2010-04-07 NOTE — Progress Notes (Signed)
Summary: Check in with patient   Phone Note Outgoing Call   Call placed by: Sherren Mocha Tzipporah Nagorski MD,  April 01, 2010 9:40 AM Call placed to: Patient Summary of Call: Ms Fjelstad is tolerating ferrous sulfate.  She is taking it three times a day as directed. She denies chest pain or syncope or worsening shortness of breath. [name] Truman Hayward is staying with her to ensure her safety.  If pt becomes symptomatic or if she fails to show an appreciable increase in her Hgb on her 04/05/10 lab draw, will admit to OBS and transfuse PRBC. Ms Rickards has not yet received hemoccult cards.  She understands to bring the completed cards with her on 04/05/10 to her [facility] lab visit for development.   Will sign FMLA form for dgt, Truman Hayward, who is staying home from work to help patient who is currently functionally limited.  Initial call taken by: Sherren Mocha Currie Dennin MD,  April 01, 2010 9:50 AM

## 2010-04-07 NOTE — Progress Notes (Signed)
Summary: Iron Deficiency Anemia Follow up    Phone Note Outgoing Call   Call placed by: Sherren Mocha McDiarmid MD,  March 30, 2010 9:08 AM Call placed to: Patient Summary of Call: [name] started her Ferrous Sulfate this morning.  No adverse effects noted.  She feels weak, but no near-syncope, chest pain or worsening shortness of breath with exertion.  Plan: Continue FeSo4 325 three times a day, send to home and complete hemoccult cards X3, come into [facility] lab on Monday 04/05/10 for repeat CBC and Retic count to assess response to iron supplement.   Of note from pt hx: 8/09 colonoscopy which showed several polyps in ascending, transverse,  cecum, and in rectum. 8/09 EGD (Buccini) sho9wed Schatzki's ring, Hiatal herian, Gastric mucosa abnl characterized by eythema and monila esophagitis.   09/03/09 [name] noted to have normocytic anemia, with low nl Vit B12 and High MMA & High Homocysteine. Retic 1.8% and Ferritin 6.  Pt started Vitamin B12 2000 International Units daily by mouth.    Initial call taken by: Sherren Mocha McDiarmid MD,  March 30, 2010 9:21 AM

## 2010-04-07 NOTE — Progress Notes (Signed)
  Medications Added FERROUS SULFATE 325 (65 FE) MG TABS (FERROUS SULFATE) One tablet three times a day on empty stomach for 6 weeks.       Phone Note Outgoing Call   Call placed by: Sherren Mocha McDiarmid MD,  March 29, 2010 4:38 PM Call placed to: Patient Summary of Call: Informed [name] of her low Hemoglobin.  She denies chest pain, syncope or near-syncope.  Denies melena, hematochezia, no indigestion or reflux or abdominal pain. Given significant decrease in MCV will treat as Iron defieciency. Ask patient to start Ferrous Sulfate 325 mg three times a day on empty stomach (may take with food if upsets stomach).  Will arrange for patient to come in for further investigative and confirmatory labs this week and monitoring labsd for response to iron tx next week.  Will need to have patient check stool for FOB.  Advised patient not to drive for now, not to make rapid changes in position.  She is to go directly to ED for PRBC transfusion if develops chest pain, worsening SOB, syncope or near syncope. She voiced understanding. Initial call taken by: Sherren Mocha McDiarmid MD,  March 29, 2010 4:47 PM  New Problems: ANEMIA (ICD-285.9) ANEMIA, IRON DEFICIENCY, MICROCYTIC (ICD-280.8)   New Problems: ANEMIA (ICD-285.9) ANEMIA, IRON DEFICIENCY, MICROCYTIC (ICD-280.8) New/Updated Medications: FERROUS SULFATE 325 (65 FE) MG TABS (FERROUS SULFATE) One tablet three times a day on empty stomach for 6 weeks. Prescriptions: FERROUS SULFATE 325 (65 FE) MG TABS (FERROUS SULFATE) One tablet three times a day on empty stomach for 6 weeks.  #100 x 1   Entered and Authorized by:   Sherren Mocha McDiarmid MD   Signed by:   Sherren Mocha McDiarmid MD on 03/29/2010   Method used:   Electronically to        [id number] (retail)       [address]       [place], [place]  [address]       Ph: [phone]       Fax: [phone]   RxID:   [id number]

## 2010-04-09 ENCOUNTER — Encounter: Payer: Self-pay | Admitting: Home Health Services

## 2010-04-09 ENCOUNTER — Telehealth: Payer: Self-pay | Admitting: Family Medicine

## 2010-04-09 NOTE — Telephone Encounter (Signed)
Message copied by Lissa Morales on Fri Apr 09, 2010 12:49 PM ------      Message from: MCDIARMID, TODD      Created: Wed Apr 07, 2010 10:12 AM      Regarding: Call patient       Check in about tolerance to med and anemia.      Check to see if referral to GI completed by blue team.

## 2010-04-09 NOTE — Telephone Encounter (Signed)
Breathing is a little better.  Able to walk a little further before getting short of breath. No chest pain, No fainting nor near-fainting. Continues to tolerate iron supplement though some consitpation that responding to glycerin supp.  [name] has appointment early next week with Dr Cristina Gong.  I told her that if Dr Cristina Gong checks her hemoblobin, she does not need to come by the [facility] for the same test.  She could offer her Hemoccult cards which she has completed to Dr Cristina Gong.  If he does not want them , she is to bring them in to [facility] for development.  Asked [name] to contact [facility] on-call should she develop symptoms over the weekend.

## 2010-04-15 NOTE — Miscellaneous (Signed)
Summary: FMLA  Fmla form dropped off to be filled out.  Please call when completed. Beryle Lathe  April 05, 2010 1:42 PM  form to pcp chart box.[name] RN  April 05, 2010 1:46 PM  called pt to let her know the form was ready to be picked up. at front desk. states her dtr will come get them.[name] RN  April 06, 2010 11:16 AM

## 2010-04-15 NOTE — Progress Notes (Signed)
   Phone Note Outgoing Call   Call placed by: Sherren Mocha Concha Sudol MD,  April 06, 2010 9:56 AM Call placed to: Patient Summary of Call: Informed [name] of improvement in Hgb to 7.1 g/dL from previous 6.0 g/dL the week prior.   She is not having chest pain, increasing shortness of breath, or near-syncope.   She remains with same DOE and fatigue.  She is tolerating the Iron talbets. She picked up the hemoccult cards from [facility] of three. She is to come in next week for a repeat CBC to monitor ongoing response of IDA to iron supplement.  Do not anticipate symptomatic improvement unitl patient reaches a >9.0 g/dL level. Will set up Consultation with Dr Cristina Gong given hx of monila esophagitis, hiatal hernia, gastric mucosa erythema, and multiple colonic polys by EGD/colonoscopy in 2009 by him.  Will complete FMLA for her dgt who is looking after [name] during this subacute functional decline.  Advised patient not to drive yet.  Will readdress as hgb and symptoms improve.  Initial call taken by: Sherren Mocha Jahrell Hamor MD,  April 06, 2010 10:01 AM

## 2010-04-16 ENCOUNTER — Telehealth: Payer: Self-pay | Admitting: Family Medicine

## 2010-04-16 DIAGNOSIS — D509 Iron deficiency anemia, unspecified: Secondary | ICD-10-CM

## 2010-04-16 NOTE — Telephone Encounter (Signed)
Mrs [name] reports her Hgb at Dr Buccini'f office on 04/12/10 was 8.4 g/dL.  She is less short of breath. No chest pain.  No near syncope.   Dr Cristina Gong is planning an EGD and Colonoscopy on 04/28/10.  Mrs [name] continues on Ferrous Sulfate 325 mg Tab TID without intolerance.  Mrs [name] will come into [facility] lab on 04/26/10 for CBC to monitor response of Hgb to iron therapy.  Will send results to Dr Buccini's office.

## 2010-04-19 ENCOUNTER — Telehealth: Payer: Self-pay | Admitting: Family Medicine

## 2010-04-20 NOTE — Telephone Encounter (Signed)
No phone call made 

## 2010-04-21 ENCOUNTER — Other Ambulatory Visit: Payer: Self-pay | Admitting: Family Medicine

## 2010-04-21 MED ORDER — OMEPRAZOLE 40 MG PO CPDR: 40.0000 mg | DELAYED_RELEASE_CAPSULE | Freq: Every day | ORAL | Status: DC

## 2010-04-21 NOTE — Telephone Encounter (Signed)
Refill request

## 2010-04-26 ENCOUNTER — Other Ambulatory Visit: Payer: Medicare Other

## 2010-04-26 ENCOUNTER — Telehealth: Payer: Self-pay | Admitting: Family Medicine

## 2010-04-26 DIAGNOSIS — D508 Other iron deficiency anemias: Secondary | ICD-10-CM

## 2010-04-26 LAB — CONVERTED CEMR LAB
HCT: 31.8 % — ABNORMAL LOW (ref 36.0–46.0)
MCV: 91.1 fL (ref 78.0–100.0)
Platelets: 247 10*3/uL (ref 150–400)
RDW: 20.2 % — ABNORMAL HIGH (ref 11.5–15.5)
WBC: 6.1 10*3/uL (ref 4.0–10.5)

## 2010-04-26 NOTE — Telephone Encounter (Signed)
Left message with patient's [name], reminding Mrs Clemens to come by for her Hemoglobin check.  Truman Hayward told me that Mrs Craige was on her way to the [facility] for the lab draw.

## 2010-04-26 NOTE — Telephone Encounter (Signed)
Message copied by Lissa Morales on Mon Apr 26, 2010 11:36 AM ------      Message from: [facility], TODD      Created: Fri Apr 16, 2010  4:54 PM       Remind patient to come in for CBC on 04/26/10 before EGD and Colonoscopy 2/29.

## 2010-04-27 ENCOUNTER — Telehealth: Payer: Self-pay | Admitting: Family Medicine

## 2010-04-27 DIAGNOSIS — Z86010 Personal history of colonic polyps: Secondary | ICD-10-CM

## 2010-04-27 DIAGNOSIS — Z9889 Other specified postprocedural states: Secondary | ICD-10-CM

## 2010-04-27 HISTORY — PX: ESOPHAGOGASTRODUODENOSCOPY: SHX1529

## 2010-04-27 HISTORY — DX: Other specified postprocedural states: Z98.890

## 2010-04-27 HISTORY — DX: Personal history of colonic polyps: Z86.010

## 2010-04-27 LAB — CBC
MCH: 26.4 pg (ref 26.0–34.0)
MCHC: 28.9 g/dL — ABNORMAL LOW (ref 30.0–36.0)
MCV: 91.1 fL (ref 78.0–100.0)
Platelets: 247 10*3/uL (ref 150–400)

## 2010-04-27 NOTE — Telephone Encounter (Signed)
I informed [name] of continued improvement in her hemoglobin level with the iron therapy. She is to go for EGD and Colonoscopy by Dr Jackquline Denmark tomorrow. She is to continue on the iron supplement for at least 6 to 9 months to rebuild body iron stores.

## 2010-04-28 ENCOUNTER — Other Ambulatory Visit: Payer: Self-pay | Admitting: Gastroenterology

## 2010-04-30 ENCOUNTER — Telehealth: Payer: Self-pay | Admitting: Family Medicine

## 2010-04-30 NOTE — Telephone Encounter (Signed)
Mrs Rohena called to let Dr Tanaya Dunigan know that Dr Cristina Gong did not find any source of bleeding with neither EGD nor Colonoscopy.  She would like to see Eyan Hagood for an office visit in next few weeks.

## 2010-05-13 LAB — POCT I-STAT 4, (NA,K, GLUC, HGB,HCT)
Glucose, Bld: 104 mg/dL — ABNORMAL HIGH (ref 70–99)
HCT: 30 % — ABNORMAL LOW (ref 36.0–46.0)
Sodium: 144 mEq/L (ref 135–145)

## 2010-05-14 ENCOUNTER — Encounter: Payer: Self-pay | Admitting: Family Medicine

## 2010-05-17 ENCOUNTER — Encounter: Payer: Self-pay | Admitting: Family Medicine

## 2010-05-17 ENCOUNTER — Ambulatory Visit (INDEPENDENT_AMBULATORY_CARE_PROVIDER_SITE_OTHER): Payer: Medicare Other | Admitting: Family Medicine

## 2010-05-17 VITALS — BP 150/80 | HR 98 | Temp 97.8°F | Wt 171.6 lb

## 2010-05-17 DIAGNOSIS — D508 Other iron deficiency anemias: Secondary | ICD-10-CM

## 2010-05-17 DIAGNOSIS — Z905 Acquired absence of kidney: Secondary | ICD-10-CM | POA: Insufficient documentation

## 2010-05-17 DIAGNOSIS — I10 Essential (primary) hypertension: Secondary | ICD-10-CM

## 2010-05-17 DIAGNOSIS — I871 Compression of vein: Secondary | ICD-10-CM

## 2010-05-17 DIAGNOSIS — M109 Gout, unspecified: Secondary | ICD-10-CM

## 2010-05-17 HISTORY — DX: Acquired absence of kidney: Z90.5

## 2010-05-17 LAB — CBC
Hemoglobin: 10.1 g/dL — ABNORMAL LOW (ref 12.0–15.0)
MCH: 26.9 pg (ref 26.0–34.0)
MCHC: 30.5 g/dL (ref 30.0–36.0)
MCV: 88.3 fL (ref 78.0–100.0)
RBC: 3.75 MIL/uL — ABNORMAL LOW (ref 3.87–5.11)

## 2010-05-17 LAB — FERRITIN: Ferritin: 39 ng/mL (ref 10–291)

## 2010-05-17 MED ORDER — FERROUS SULFATE 325 (65 FE) MG PO TABS: 325.0000 mg | ORAL_TABLET | Freq: Every day | ORAL | Status: DC

## 2010-05-18 ENCOUNTER — Telehealth: Payer: Self-pay | Admitting: *Deleted

## 2010-05-18 ENCOUNTER — Encounter: Payer: Self-pay | Admitting: Family Medicine

## 2010-05-18 LAB — BASIC METABOLIC PANEL
BUN: 32 mg/dL — ABNORMAL HIGH (ref 6–23)
BUN: 32 mg/dL — ABNORMAL HIGH (ref 6–23)
CO2: 28 mEq/L (ref 19–32)
Calcium: 7.3 mg/dL — ABNORMAL LOW (ref 8.4–10.5)
Chloride: 104 mEq/L (ref 96–112)
Chloride: 105 mEq/L (ref 96–112)
Chloride: 108 mEq/L (ref 96–112)
GFR calc Af Amer: 23 mL/min — ABNORMAL LOW (ref >60)
GFR calc Af Amer: 24 mL/min — ABNORMAL LOW (ref >60)
GFR calc Af Amer: 24 mL/min — ABNORMAL LOW (ref >60)
GFR calc non Af Amer: 19 mL/min — ABNORMAL LOW (ref >60)
GFR calc non Af Amer: 20 mL/min — ABNORMAL LOW (ref >60)
GFR calc non Af Amer: 22 mL/min — ABNORMAL LOW (ref >60)
Glucose, Bld: 114 mg/dL — ABNORMAL HIGH (ref 70–99)
Glucose, Bld: 126 mg/dL — ABNORMAL HIGH (ref 70–99)
Potassium: 4.1 mEq/L (ref 3.5–5.1)
Potassium: 4.3 mEq/L (ref 3.5–5.1)
Potassium: 4.4 mEq/L (ref 3.5–5.1)
Potassium: 5.3 mEq/L — ABNORMAL HIGH (ref 3.5–5.1)
Sodium: 136 mEq/L (ref 135–145)
Sodium: 137 mEq/L (ref 135–145)
Sodium: 140 mEq/L (ref 135–145)

## 2010-05-18 LAB — CBC
HCT: 24 % — ABNORMAL LOW (ref 36.0–46.0)
HCT: 30.1 % — ABNORMAL LOW (ref 36.0–46.0)
HCT: 33 % — ABNORMAL LOW (ref 36.0–46.0)
Hemoglobin: 10.9 g/dL — ABNORMAL LOW (ref 12.0–15.0)
Hemoglobin: 9.8 g/dL — ABNORMAL LOW (ref 12.0–15.0)
MCHC: 33.1 g/dL (ref 30.0–36.0)
MCHC: 33.7 g/dL (ref 30.0–36.0)
MCV: 85.4 fL (ref 78.0–100.0)
MCV: 86.1 fL (ref 78.0–100.0)
MCV: 86.2 fL (ref 78.0–100.0)
Platelets: 156 10*3/uL (ref 150–400)
Platelets: 230 10*3/uL (ref 150–400)
RBC: 2.81 MIL/uL — ABNORMAL LOW (ref 3.87–5.11)
RBC: 3.4 MIL/uL — ABNORMAL LOW (ref 3.87–5.11)
RBC: 3.49 MIL/uL — ABNORMAL LOW (ref 3.87–5.11)
RDW: 14.3 % (ref 11.5–15.5)
WBC: 10 10*3/uL (ref 4.0–10.5)
WBC: 7.8 10*3/uL (ref 4.0–10.5)
WBC: 8.4 10*3/uL (ref 4.0–10.5)

## 2010-05-18 LAB — COMPREHENSIVE METABOLIC PANEL
Albumin: 3.7 g/dL (ref 3.5–5.2)
Alkaline Phosphatase: 96 U/L (ref 39–117)
BUN: 30 mg/dL — ABNORMAL HIGH (ref 6–23)
Calcium: 8.4 mg/dL (ref 8.4–10.5)
Creatinine, Ser: 2.08 mg/dL — ABNORMAL HIGH (ref 0.4–1.2)
Glucose, Bld: 106 mg/dL — ABNORMAL HIGH (ref 70–99)
Total Protein: 6.9 g/dL (ref 6.0–8.3)

## 2010-05-18 LAB — URINALYSIS, ROUTINE W REFLEX MICROSCOPIC
Hgb urine dipstick: NEGATIVE
Nitrite: NEGATIVE
Protein, ur: NEGATIVE mg/dL
Specific Gravity, Urine: 1.017 (ref 1.005–1.030)
Urobilinogen, UA: 1 mg/dL (ref 0.0–1.0)

## 2010-05-18 LAB — PROTIME-INR
INR: 0.98 (ref 0.00–1.49)
INR: 1.13 (ref 0.00–1.49)
INR: 2.94 — ABNORMAL HIGH (ref 0.00–1.49)
Prothrombin Time: 12.9 seconds (ref 11.6–15.2)
Prothrombin Time: 14.4 seconds (ref 11.6–15.2)
Prothrombin Time: 30.4 seconds — ABNORMAL HIGH (ref 11.6–15.2)

## 2010-05-18 LAB — APTT: aPTT: 32 seconds (ref 24–37)

## 2010-05-18 LAB — TYPE AND SCREEN: Antibody Screen: NEGATIVE

## 2010-05-18 LAB — URINE MICROSCOPIC-ADD ON

## 2010-05-18 MED ORDER — POTASSIUM CHLORIDE CRYS ER 20 MEQ PO TBCR: 40.0000 meq | EXTENDED_RELEASE_TABLET | Freq: Every day | ORAL | Status: DC | PRN

## 2010-05-18 NOTE — Progress Notes (Signed)
  Subjective:    Patient ID: [name], female    DOB: [birth year], 77 y.o.   MRN: [id number]  HPI Shortness of Breath Much improved over last month. Now able to walk flat surfaces without having to stop. Still has to stop half-way up her home steps to rest then rest again at top of 2nd floor landing, but this is better than previously.  No cough, no fever, no sputum increase. No chest pain, no orthopnea, Sleeps with 2 pillow (chronically)  (+) bilateral ankle edema per patient that she took one lasix tablet yesterday with good improvement in edema of legs.   Taking ferrous sulfate three times a day with increase in 'gassiness" No melena nor hematochezia.  No hx of Heart Failure.  (+) Hx pAFIB, S/P PCI (10/02/2008, Dr Daneen Schick) after NSTEMI with RCA BARE METAL STENT CAD remaining Circumflex Obtuse marginal with 90% stenosis (left for medical mangement for)   HYPERTENSION Not taking BP at home. NO chest pain, NO claudication. NO new dyspnea.  Taking metoprolol 50 mg twice daily.  HCTZ was stopped recently because of new acute Gout-like arthritis of wrist.  No syncope, no dizziness, no wheezing Diet: watching salt in diet.  Taking gentle exercise at home.   Medications, past medical history,  family history, social history were reviewed and updated.    Habits & Providers  Alcohol-Tobacco-Diet     Alcohol drinks/day: <1     Tobacco Status: quit   Review of Systems: See HPI     Objective:   Physical Exam  Constitutional: Vital signs are normal.  Non-toxic appearance. She does not have a sickly appearance. She does not appear ill. No distress.  Cardiovascular: Normal rate, regular rhythm, normal heart sounds and normal pulses.   Pulmonary/Chest: Effort normal and breath sounds normal.  Musculoskeletal:       Right ankle: She exhibits swelling.       Left ankle: She exhibits swelling.  Neurological: She is alert.       Normal gait  Psychiatric: She has a normal mood and  affect. Her speech is normal. Thought content normal. Cognition and memory are normal.          Assessment & Plan:

## 2010-05-18 NOTE — Telephone Encounter (Signed)
Pt called pharmacy line stating that Dr.McDiarmid had just called her.  She ask that he give her a call back.  Will forward to MD Fleeger, Salome Spotted

## 2010-05-18 NOTE — Telephone Encounter (Signed)
Discussed improvement in hgb and ferritin.  Asked [name] to continue Ferrous Sulfate until end of June but at a reduced dose of one tablet ferrous sulfate daily. Will recheck ferritin and hemoglobin at that time.

## 2010-05-18 NOTE — Assessment & Plan Note (Signed)
Adequate BP control. Tolerating medications.  No new end-organ damage.  Continue current medications.

## 2010-05-18 NOTE — Assessment & Plan Note (Signed)
Lab Results  Component Value Date   HGB 10.1* 05/17/2010   Lab Results  Component Value Date   FERRITIN 39 05/17/2010   Improving Hgb.  Up from 6.0 g/dL about one month ago.  Ferritin has doubled in same time period. Recommend continuing ferrous sulfate but only one tablet daily now.  Continue until July 2012 (~ 6 months of repletion of iron stores).  Will check hemoglobin and ferritin at that time before stopping ferrous sulfate supplement.

## 2010-05-24 ENCOUNTER — Telehealth: Payer: Self-pay | Admitting: Family Medicine

## 2010-05-24 NOTE — Telephone Encounter (Signed)
Pain and swelling in left wrist - like her "gout " before.  Wants to know if it is OK to take prednisone. No fever.  Agree with patient taking one prednisone 20 mg now.  Should have significant pain control in 12 hours.  If not improved, should come in to see Walton tomorrow at [facility].

## 2010-05-25 NOTE — Telephone Encounter (Signed)
Wrist is more than 50 percent better this morning.  Still painful with motion.   Plan to continue Prednisone 20 mg daily for 3 days after complete resolution of painful symptoms. [name] to to let [facility] know if wrist pain worsens.

## 2010-06-04 LAB — BASIC METABOLIC PANEL
BUN: 14 mg/dL (ref 6–23)
GFR calc Af Amer: 51 mL/min — ABNORMAL LOW (ref >60)
GFR calc non Af Amer: 42 mL/min — ABNORMAL LOW (ref >60)
Potassium: 4 mEq/L (ref 3.5–5.1)

## 2010-06-04 LAB — POCT I-STAT, CHEM 8
BUN: 12 mg/dL (ref 6–23)
Calcium, Ion: 1.06 mmol/L — ABNORMAL LOW (ref 1.12–1.32)
Creatinine, Ser: 1 mg/dL (ref 0.4–1.2)
Glucose, Bld: 122 mg/dL — ABNORMAL HIGH (ref 70–99)
Hemoglobin: 9.9 g/dL — ABNORMAL LOW (ref 12.0–15.0)
TCO2: 26 mmol/L (ref 0–100)

## 2010-06-04 LAB — DIFFERENTIAL
Eosinophils Absolute: 0.6 10*3/uL (ref 0.0–0.7)
Eosinophils Relative: 8 % — ABNORMAL HIGH (ref 0–5)
Lymphocytes Relative: 20 % (ref 12–46)
Lymphs Abs: 1.4 10*3/uL (ref 0.7–4.0)
Monocytes Absolute: 0.7 10*3/uL (ref 0.1–1.0)

## 2010-06-04 LAB — CARDIAC PANEL(CRET KIN+CKTOT+MB+TROPI)
CK, MB: 1 ng/mL (ref 0.3–4.0)
CK, MB: 1.2 ng/mL (ref 0.3–4.0)
Relative Index: INVALID (ref 0.0–2.5)
Total CK: 59 U/L (ref 7–177)
Troponin I: 0.02 ng/mL (ref 0.00–0.06)

## 2010-06-04 LAB — CK TOTAL AND CKMB (NOT AT ARMC)
CK, MB: 1.2 ng/mL (ref 0.3–4.0)
Relative Index: INVALID (ref 0.0–2.5)
Total CK: 56 U/L (ref 7–177)

## 2010-06-04 LAB — CBC
HCT: 29.4 % — ABNORMAL LOW (ref 36.0–46.0)
Hemoglobin: 10.2 g/dL — ABNORMAL LOW (ref 12.0–15.0)
MCV: 89.4 fL (ref 78.0–100.0)
WBC: 7 10*3/uL (ref 4.0–10.5)

## 2010-06-04 LAB — PROTIME-INR
INR: 1 (ref 0.00–1.49)
Prothrombin Time: 13.1 seconds (ref 11.6–15.2)

## 2010-06-04 LAB — BRAIN NATRIURETIC PEPTIDE: Pro B Natriuretic peptide (BNP): 716 pg/mL — ABNORMAL HIGH (ref 0.0–100.0)

## 2010-06-04 LAB — LIPID PANEL
HDL: 25 mg/dL — ABNORMAL LOW (ref >39)
Total CHOL/HDL Ratio: 3.5 RATIO

## 2010-06-05 LAB — CBC
HCT: 33.3 % — ABNORMAL LOW (ref 36.0–46.0)
Hemoglobin: 11.5 g/dL — ABNORMAL LOW (ref 12.0–15.0)
Hemoglobin: 12.3 g/dL (ref 12.0–15.0)
MCHC: 34 g/dL (ref 30.0–36.0)
MCV: 87.1 fL (ref 78.0–100.0)
Platelets: 200 10*3/uL (ref 150–400)
Platelets: 207 10*3/uL (ref 150–400)
Platelets: 221 10*3/uL (ref 150–400)
RBC: 3.55 MIL/uL — ABNORMAL LOW (ref 3.87–5.11)
RBC: 4.16 MIL/uL (ref 3.87–5.11)
RDW: 13.4 % (ref 11.5–15.5)
RDW: 13.6 % (ref 11.5–15.5)
WBC: 6.9 10*3/uL (ref 4.0–10.5)
WBC: 7.6 10*3/uL (ref 4.0–10.5)

## 2010-06-05 LAB — BASIC METABOLIC PANEL
BUN: 15 mg/dL (ref 6–23)
BUN: 16 mg/dL (ref 6–23)
BUN: 20 mg/dL (ref 6–23)
Calcium: 8.4 mg/dL (ref 8.4–10.5)
Calcium: 8.6 mg/dL (ref 8.4–10.5)
Creatinine, Ser: 1.08 mg/dL (ref 0.4–1.2)
Creatinine, Ser: 1.23 mg/dL — ABNORMAL HIGH (ref 0.4–1.2)
GFR calc Af Amer: 51 mL/min — ABNORMAL LOW (ref >60)
GFR calc non Af Amer: 42 mL/min — ABNORMAL LOW (ref >60)
GFR calc non Af Amer: 45 mL/min — ABNORMAL LOW (ref >60)
GFR calc non Af Amer: 49 mL/min — ABNORMAL LOW (ref >60)
Glucose, Bld: 126 mg/dL — ABNORMAL HIGH (ref 70–99)
Potassium: 3 mEq/L — ABNORMAL LOW (ref 3.5–5.1)
Sodium: 138 mEq/L (ref 135–145)
Sodium: 141 mEq/L (ref 135–145)

## 2010-06-05 LAB — URINALYSIS, ROUTINE W REFLEX MICROSCOPIC
Protein, ur: NEGATIVE mg/dL
Urobilinogen, UA: 0.2 mg/dL (ref 0.0–1.0)

## 2010-06-05 LAB — CARDIAC PANEL(CRET KIN+CKTOT+MB+TROPI)
CK, MB: 2.6 ng/mL (ref 0.3–4.0)
CK, MB: 2.9 ng/mL (ref 0.3–4.0)
Total CK: 64 U/L (ref 7–177)
Troponin I: 0.15 ng/mL — ABNORMAL HIGH (ref 0.00–0.06)

## 2010-06-05 LAB — DIFFERENTIAL
Basophils Absolute: 0 10*3/uL (ref 0.0–0.1)
Basophils Relative: 1 % (ref 0–1)
Eosinophils Absolute: 0.3 10*3/uL (ref 0.0–0.7)
Eosinophils Relative: 4 % (ref 0–5)

## 2010-06-05 LAB — POCT CARDIAC MARKERS
CKMB, poc: 1.2 ng/mL (ref 1.0–8.0)
Troponin i, poc: 0.08 ng/mL (ref 0.00–0.09)
Troponin i, poc: <0.05 ng/mL (ref 0.00–0.09)

## 2010-06-05 LAB — CK: Total CK: 310 U/L — ABNORMAL HIGH (ref 7–177)

## 2010-06-05 LAB — URINE MICROSCOPIC-ADD ON

## 2010-06-05 LAB — COMPREHENSIVE METABOLIC PANEL
ALT: 12 U/L (ref 0–35)
AST: 16 U/L (ref 0–37)
CO2: 28 mEq/L (ref 19–32)
Chloride: 106 mEq/L (ref 96–112)
GFR calc Af Amer: >60 mL/min (ref >60)
GFR calc non Af Amer: 53 mL/min — ABNORMAL LOW (ref >60)
Sodium: 138 mEq/L (ref 135–145)
Total Bilirubin: 1.3 mg/dL — ABNORMAL HIGH (ref 0.3–1.2)

## 2010-06-05 LAB — URINE CULTURE

## 2010-06-05 LAB — HEPARIN LEVEL (UNFRACTIONATED): Heparin Unfractionated: 0.57 IU/mL (ref 0.30–0.70)

## 2010-06-05 LAB — PROTIME-INR: Prothrombin Time: 12.7 seconds (ref 11.6–15.2)

## 2010-06-23 ENCOUNTER — Other Ambulatory Visit: Payer: Self-pay | Admitting: Family Medicine

## 2010-06-23 DIAGNOSIS — D508 Other iron deficiency anemias: Secondary | ICD-10-CM

## 2010-06-24 ENCOUNTER — Other Ambulatory Visit: Payer: Medicare Other

## 2010-06-24 DIAGNOSIS — D508 Other iron deficiency anemias: Secondary | ICD-10-CM

## 2010-06-24 LAB — CBC
HCT: 36.9 % (ref 36.0–46.0)
Hemoglobin: 11.4 g/dL — ABNORMAL LOW (ref 12.0–15.0)
WBC: 6.5 10*3/uL (ref 4.0–10.5)

## 2010-06-24 LAB — FERRITIN: Ferritin: 26 ng/mL (ref 10–291)

## 2010-06-24 NOTE — Progress Notes (Signed)
Ferritin and cbc done today [facility] Korie Streat

## 2010-06-29 DIAGNOSIS — H3562 Retinal hemorrhage, left eye: Secondary | ICD-10-CM

## 2010-06-29 HISTORY — DX: Retinal hemorrhage, left eye: H35.62

## 2010-07-01 ENCOUNTER — Other Ambulatory Visit: Payer: Self-pay | Admitting: Gastroenterology

## 2010-07-01 DIAGNOSIS — D509 Iron deficiency anemia, unspecified: Secondary | ICD-10-CM

## 2010-07-01 DIAGNOSIS — R195 Other fecal abnormalities: Secondary | ICD-10-CM

## 2010-07-13 NOTE — Discharge Summary (Signed)
NAMECarmencita, [name]                   ACCOUNT NO.:  [id number]   MEDICAL RECORD NO.:  [id number]          PATIENT TYPE:  INP   LOCATION:  1606                         FACILITY:  [facility]   PHYSICIAN:  [name], M.D.    DATE OF BIRTH:  01-18-1924   DATE OF ADMISSION:  10/18/2006  DATE OF DISCHARGE:  10/24/2006                               DISCHARGE SUMMARY   ADMISSION DIAGNOSES:  1. Osteoarthritis, right knee.  2. Hypertension.  3. Sick sinus syndrome.  4. Atrial fibrillation.  5. Status post pacemaker placement.  6. Sacroiliacitis.  7. Allergic rhinitis.  8. Postherpetic neuralgia.  9. Obesity.  10.Lumbar spinal stenosis with spondylolisthesis, L5-S1.  11.Hyperlipidemia.  12.Hiatal hernia.  13.Hemorrhoids.  14.Gastroesophageal reflux disease.  15.Peripheral edema.  16.Disk pathology with radiculopathy.  17.Chronic chronic obstructive pulmonary disease.  18.History of colon polyps.  19.Anxiety.  20.History of left adrenal adenoma status post nephrectomy.  21.Asthmatic bronchitis.   DISCHARGE DIAGNOSES:  1. Osteoarthritis, right knee, status post right total knee      replacement arthroplasty.  2. Postoperative blood loss anemia.  Did not require transfusion.  3. Hypertension.  4. Sick sinus syndrome.  5. Atrial fibrillation.  6. Status post pacemaker placement.  7. Sacroiliacitis.  8. Allergic rhinitis.  9. Postherpetic neuralgia.  10.Obesity.  11.Lumbar spinal stenosis with spondylolisthesis, L5-S1.  12.Hyperlipidemia.  13.Hiatal hernia.  14.Hemorrhoids.  15.Gastroesophageal reflux disease.  16.Peripheral edema.  17.Disk pathology with radiculopathy.  18.Chronic chronic obstructive pulmonary disease.  19.History of colon polyps.  20.Anxiety.  21.History of left adrenal adenoma status post nephrectomy.  22.Asthmatic bronchitis.   PROCEDURE:  On October 18, 2006, right total knee.  Surgeon Dr. Wynelle Link,  anesthesia spinal.   CONSULTATIONS:  1. Cardiology,  Dr. Pernell Dupre.  2. Rehabilitation services, Dr. Jarvis Morgan.   BRIEF HISTORY:  [name] is an 77 year old female with end-stage  arthritis of both knees, right more symptomatic than the left.  She has  failed nonoperative management and now presents for a total knee  arthroplasty.   LABORATORY DATA:  Preoperative CBC showed a hemoglobin of 13.0,  hematocrit 38.1, white cell count 7.4.  Postoperative hemoglobin 10.1,  drifting down to 10.  Last noted H&H 9.4 and 27.6.  PT/PTT  preoperatively 12.2 and 29, respectively.  INR 0.9.  Serial pro times  were followed.  Last noted PT/INR 23.1 and 2.0.  Chemistry panel on  admission revealed a slightly elevated glucose of 121.  Remaining  chemistry panel all within normal limits.  Serial BMETS were followed.  Electrolytes remained within normal limits.  Preoperative UA only showed  small leukocyte esterase, 0-2 white cells.  Blood group type A positive.   Chest x-ray dated February 27, 2006, stable exam with chronic lung  changes, cardiomegaly, no acute superimposed findings.  EKG on February 17, 2006 showed normal sinus rhythm, septal infarct, age undetermined;  no specific change was found, confirmed by Dr. Jenkins Rouge.   HOSPITAL COURSE:  The patient was admitted to [facility] and  underwent the above procedure without complications.  She tolerated  the  procedure well, was transferred to the recovery room and then to the  telemetry floor for monitoring.  Consult was called into Dr. Pernell Dupre  due to her sick sinus syndrome and history of atrial fibrillation.  She  does have pacemaker placement.  Dr. [name] who had followed along from a  cardiac standpoint saw the patient and evaluated.  She was followed on  the telemetry floor.  Beta-blockers were avoided due to her history of  bronchospasms in the past.  Recommend Cardizem if needed.  She was  stable from a cardiac standpoint.  Remained on telemetry through the  night and  through the following day.  Doing well except for pain on  postoperative day #1.  Increased to high-dose PCA and started on  Coumadin for deep venous thrombosis.  By day #2, she was stable from a  cardiac standpoint, discontinued the telemetry, and was moved over to  the orthopedic floor.  The patient did not have any help after hospital  stay and lived in a two-story home.  Wanted to get a rehabilitation  consult.  That was ordered.  Patient was seen by Dr. Jarvis Morgan and  felt that she likely would benefit from inpatient rehabilitation.  They  followed along for several days.  By day #2, she was still having a  little discomfort but not using her medications.  We encouraged her to  use p.o. medications.  We discontinued the PCA.  We also got care  management social worker involved just in case we would not have a bed  on rehabilitation services.  She was slowly progressing with her PT.  By  day #3, she was a little groggy.  Dressing had been changed on day #2.  The incision looked good and continued to heal well.  Although she was  slowly progressing with her PT and only getting up about 40 feet,  although by day #3 and 4 she was increasing her mobility and felt to be  getting close to a point where she could be discharged.  She was stable  from a cardiac standpoint.  Unfortunately, there were no beds on the  rehabilitation facility floor; therefore, we are going to be relying on  skilled nursing facilities and rehabilitation beds in the community.  Discharge planning got involved.  Social worker sent out the [facility] to look  for a bed.  She slowly progressed through the weekend, and on Monday she  was seen back in rounds.  She was felt to be stable.  Continued with her  therapy.  On the morning of October 24, 2006, the patient was stable.  We  are looking for a bed.  Incision looked good.  It was felt that she was  stable enough that if a bed became available, she could be transferred   out.  Awaiting on placement at this time.   DISCHARGE PLAN:  Patient possible discharge today to skilled nursing  facility/rehabilitation facility on October 24, 2006.   DISCHARGE DIAGNOSES:  Please see above.   DISCHARGE MEDICATIONS:  1. Colace 100 mg p.o. b.i.d.  2. Protonix 40 mg p.o. daily.  3. Advair 250/500 Diskus inhaler b.i.d.  4. Spiriva 18 mcg capsule inhalation only daily.  5. Albuterol/Ventolin inhaler 90 mcg daily.  6. Coumadin protocol.  Please titrate the Coumadin level for a target      INR between 2.0 and 3.0.  She needs to be on Coumadin for 3 weeks  from date of surgery of October 18, 2006.  7. Laxative of choice.  8. Enema of choice.  9. Percocet 5 mg 1 or 2 every 4-6 hours as needed for pain.  10.Tylenol 325 1 or 2 every 4-6 hours as needed for mild pain, except      for headache.  11.Robaxin 500 mg p.o. q.6-8h. p.r.n. spasm.  12.Benadryl 25 mg p.o. q.h.s. p.r.n. sleep.  13.Ativan 0.5 mg p.o. q.8h. p.r.n.  14.Celebrex 200 mg p.o. daily p.r.n. (she usually takes this about      twice a week).  15.Dulcolax 5 mg tablets p.o. daily p.r.n.  16.MiraLax 17 g in 8 ounces of water daily p.r.n.  17.Maalox 30 mg p.o. p.r.n. indigestion.   DIET:  Cardiac, heart-healthy diet.   ACTIVITY:  Weightbearing as tolerated to the right lower extremity.  Gait training, ambulation, and ADLs.  PT and OT for total knee protocol.  She may start showering; however, do not submerge the incision under  water.  Daily dressing changes.   FOLLOW UP:  She needs to follow up with Dr. Wynelle Link in the office about  2 weeks from her surgery date.  Contact the office at [phone] to  arrange appointment time and appropriate transfer of the patient.   DISPOSITION:  Pending at this time.   CONDITION ON DISCHARGE:  Pending but improving.      Alexzandrew L. Perkins, P.A.C.      [name], M.D.  Electronically Signed    ALP/MEDQ  D:  10/24/2006  T:  10/24/2006  Job:   [id number]   cc:   Belva Crome, M.D.  Fax: [phone]   [name], M.D.  Fax: [phone]   Blane Ohara McDiarmid, M.D.   Jarvis Morgan, M.D.  Fax: [phone]

## 2010-07-13 NOTE — Op Note (Signed)
NAMEShamonte, [name]                   ACCOUNT NO.:  [id number]   MEDICAL RECORD NO.:  [id number]          PATIENT TYPE:  INP   LOCATION:  1419                         FACILITY:  [facility]   PHYSICIAN:  [name], M.D.    DATE OF BIRTH:  October 29, 1923   DATE OF PROCEDURE:  10/18/2006  DATE OF DISCHARGE:                               OPERATIVE REPORT   PREOPERATIVE DIAGNOSIS:  Osteoarthritis, right knee.   POSTOPERATIVE DIAGNOSIS:  Osteoarthritis, right knee.   PROCEDURE:  Right total knee arthroplasty.   SURGEON:  [name], M.D.   ASSISTANT:  Nurse assist.   ANESTHESIA:  Spinal.   ESTIMATED BLOOD LOSS:  Minimal.   DRAIN:  None.   COMPLICATIONS:  None.   TOURNIQUET TIME:  35 minutes at 300 mmHg.   CONDITION:  Stable to recovery.   CLINICAL NOTE:  [name] is an 77 year old female with end-stage  arthritis of both knees, right more symptomatic than the left.  She has  failed nonoperative management and presents now for total knee  arthroplasty.   PROCEDURE IN DETAIL:  After successful initiation of spinal anesthetic,  a tourniquet was placed on her right thigh and right lower extremity  prepped and draped in the usual sterile fashion.  Extremity was wrapped  in Esmarch, knee flexed, tourniquet inflated to 300 mmHg.  Midline  incision was made with 10 blade through subcutaneous tissue to the level  of the extensor mechanism.  Fresh blade was used make a medial  parapatellar arthrotomy.  Soft tissue over the proximal medial tibia was  subperiosteally elevated at the joint line with the knife and into the  semimembranosus bursa with a Cobb elevator.  Soft tissue laterally was  elevated with attention being paid to avoid patellar tendon on tibial  tubercle.  Patella subluxed laterally, knee flexed to 90 degrees, ACL  and PCL removed.  Drill was used to create a starting hole in the distal  femur, and the canal was thoroughly irrigated.  The 5 degrees right  valgus alignment  guide was placed, and referencing off the posterior  condyles, rotation was marked and the block pinned to remove 10 mm off  of the distal femur.  Distal femoral resection was made an oscillating  saw.  Sizing block was placed; size 2.5 was most appropriate.  Rotations  marked at the epicondylar axis.  Size 2.5 cutting block placed and the  anterior, posterior and chamfer cuts made.   The tibia subluxed forward and the menisci were removed.  Extramedullary  tibial alignment guide was placed referencing proximally at the medial  aspect of the tibial tubercle and distally along the second metatarsal  axis and tibial crest.  The block was pinned to remove 10 mm of the  nondeficient lateral side.  Tibial resection was made with an  oscillating saw.  Size 2.5 was the most appropriate tibial component and  the proximal tibia prepared with the modular drill and keel punch for a  2.5.  Femoral preparation was completed with the intercondylar cut.   Size 2.5 mobile bearing tibial trial,  2.5 posterior stabilized femoral  trial and a 10-mm posterior stabilized rotating platform insert trial  were placed.  With the 10, full extension was achieved with excellent  varus and valgus balance throughout full range of motion.  The patella  was then everted and thickness measured to be at 22 mm.  Freehand  resection to 13 mm; 35 template was placed, lug holes were drilled,  trial patella was placed and it tracked normally.  Osteophytes were  removed off the posterior femur with the trial in place.  All trials  were removed, and the cut bone surfaces were prepared with pulsatile  lavage.  Cement was mixed, and once ready for implantation, the size 2.5  mobile bearing tibial tray, 2.5 posterior stabilized femur and 35  patella were cemented into place, and patella was held with a clamp.  Trial 10-mm inserts were placed and knee held in full extension.  All  extruded cement removed.  Once cement was fully  hardened, the wound was  copiously irrigated with saline solution and then FloSeal injected on  the posterior capsule.  The permanent 10-mm posterior stabilized  rotating platform insert was placed in the tibial tray.  The rest of  FloSeal was injected in the medial lateral gutters and suprapatellar  area.  Moist sponge was placed, and tourniquet was released with a total  time of 35 minutes.  Minor bleeding stopped with cautery.  Further  irrigation was performed and extensor mechanism closed with interrupted  #1 PDS.  Flexion against gravity to 135 degrees.  Subcutaneous was  closed with interrupted 2-0 Vicryl, subcuticular running 4-0 Monocryl.  The incision was cleaned and dried, and Steri-Strips and bulky sterile  dressing were applied.  She was placed a knee immobilizer, awakened,  transferred to recovery in stable condition.      [name], M.D.  Electronically Signed     FA/MEDQ  D:  10/18/2006  T:  10/19/2006  Job:  [id number]

## 2010-07-13 NOTE — H&P (Signed)
NAMESioux, [name]                   ACCOUNT NO.:  [id number]   MEDICAL RECORD NO.:  [id number]          PATIENT TYPE:  INP   LOCATION:  3732                         FACILITY:  [facility]   PHYSICIAN:  Ayr A. Walker Kehr, M.D.    DATE OF BIRTH:  June 04, 1923   DATE OF ADMISSION:  09/30/2008  DATE OF DISCHARGE:                              HISTORY & PHYSICAL   PRIMARY CARE Tongela Encinas:  Blane Ohara McDiarmid, MD, at [facility].   HISTORY OF PRESENT ILLNESS:  Ms. Lerer gives her own history and is  reliable.  She presents to the [facility] ED this morning after  experiencing crushing substernal chest pain for 15 minutes this morning,  that was not relieved by 3 doses of nitroglycerin and albuterol  breathing treatment.  She first noticed the pain at around 9 a.m.  started with a morning breakfast.  It was initially stabbing and then  developed into crushing pain.  It did radiate to her back, but not down  her arm or upper her neck.  It is associated with nausea,  lightheadedness, and a feeling of doom that she described as like I was  going to die.  She denies diaphoresis or difficulty breathing at the  time of the pain or now.  The pain abated when the ambulance arrived and  after they gave her 4 doses of 81 mg of aspirin that she chewed.  When  we see her in the emergency room, she feels well at time of exam.  Of  note, she had been complaining of back pain the night before.   PAST MEDICAL HISTORY:  1. Coronary artery disease.  2. Hypertension.  3. Paroxysmal AFib.  4. Sick sinus syndrome on pacemaker.  5. COPD.  6. Metabolic syndrome.  7. Hyperlipidemia.  8. Obesity.  9. Osteoarthritis of the lower extremities.  10.GERD.  11.History of transitional cell carcinoma of one kidney.   PAST SURGICAL HISTORY:  She is status post cholecystectomy and status  post nephrectomy for transitional cell carcinoma in March 2003, and she  is status post appendectomy and pacemaker lead  extraction and re-  insertion in 2005.   MEDICATION LIST:  1. Advair 250/50 twice a day.  2. Aspirin 81 two tablets once a day.  3. Ativan 0.5 mg 1-2 tabs every 8 hours p.r.n. anxiety.  4. Darvocet 100/650 half a tablet every 6 hours p.r.n. pain.  5. Lasix 40 mg p.o. p.r.n. leg swelling.  6. Spiriva HandiHaler 18 mcg inhale 1 capsule once a day.  7. Tums 1 tablet twice a day.  8. Nitrostat 0.4 mg 1 tablet sublingually every 5 minutes for chest      pain.  9. Flonase 150 mg 1 spray in each nostril once a day.  10.Hydrochlorothiazide 12.5 mg p.o. daily.  11.Ventolin HFA 2 sprays inhaled 4 times a day p.r.n. shortness of      breath.  12.Celebrex 100 mcg caps 1 tablet twice daily.  13.Omeprazole 40 mg daily.   ALLERGIES:  CODEINE which produces nausea and SINGULAIR.  FAMILY HISTORY:  Father who died of a myocardial infarction at 18 years  old and mother who died at 10 years old with dementia of Alzheimer's.   SOCIAL HISTORY:  She is a widow, lives alone that has family members who  are very involved in her care.  She has a dog she lives with.  She has a  home nebulizer for inhalation therapy, and she is an off and on smoker  and currently has been smoke free for 2 months but has a heavy smoking  history.  She denies alcohol use.   REVIEW OF SYSTEMS:  Twelve-point review of system is negative except for  what is noted in the HPI.   PHYSICAL EXAMINATION:  VITAL SIGNS:  She is afebrile.  Her blood  pressure is 109/54, heart rate is in the 90s, temperature is 97.9, and  she is respirating at 20 beats per minute and saturating well on room  air.  GENERAL:  She is well-appearing woman in no acute distress.  EYES:  Extraocular movement intact.  Sclerae anicteric.  NECK:  No JV noted when sitting upright.  CHEST:  Chest wall is nontender to palpation.  LUNGS:  She has faint lung sounds on auscultation with a long expiratory  phase.  No wheezes were noted.  HEART:  Sounds are faint,  but she has no murmurs, rubs, or gallops and  the rate is regular rhythm.  ABDOMEN:  She is obese, has normoactive bowel sounds, and is soft and  nontender.  No masses are palpated.  EXTREMITIES:  No edema noted on bilateral lower extremities.  NEUROLOGIC:  She is alert and oriented x3.   LABS AND STUDIES:  CBC, white count of 6.9, H and H of 12.3 and 36.2,  and platelets 233.  Complete metabolic panel with normal limits with the  exception potassium of 3.8, bicarb 20, and total bilirubin of 1.3.  Cardiac enzymes are negative x1.  INR is 0.9.   Chest x-ray notable for bibasilar scarring and slight atelectasis.   EKG, she is in normal sinus rhythm with a rate of 75.  Her EKG is  significant for downward slopping ST depression of around 2 mm of leads  V5 and V6.  She has no ST depression in other leads, especially none in  aVL.  This EKG is a change for her over her previous EKG in the chart.   ASSESSMENT AND PLAN:  This is an 77 year old female in the hospital for  chest pain.  1. Chest pain.  Etiology is unclear.  It is possible, she has      Prinzmetal angina versus a non-ST elevation myocardial infarction      versus gastroesophageal reflux disease versus esophageal spasm.      Her cardiac enzymes are negative x1 and EKG is not typical of large      ongoing ischemia, and she does have a history of  gastroesophageal      reflux disease and episodes like this in the past.  Our plan is to      treat like a normal chest pain rule out.  We will cycle enzymes x3      every 8 hours.  We are going to hold the heparin drip at this time      unless we noticed a bump in her cardiac enzymes.  We have contacted      [facility] to inform that the patient is in the hospital.  She has requested to see her Dr. [name].  2. Chronic obstructive pulmonary disease, but she does not have a      dyspnea at this time nor she have a wheezing.  We are going to      continue her home medications or  their equivalent while she is in      the hospital.  3. Hypertension.  We plan on continuing her home medications.  We were      not going to transition to beta-blocker and an ACE inhibitor at      this time as her blood pressure is stable and she has a normal      ejection fraction on echocardiograph.  4. Prophylaxis.  We are going to provide Protonix while she is in the      hospital and provide sequential compression devices for VTE      prophylaxis.  5. Disposition.  We anticipate discharge in the a.m. with normal      cardiac enzymes overnight and no change on EKG.  We plan on      followup with Dr. Wendy Poet at [facility]      and Dr. [name] at [facility].      Lynne Leader, MD  Electronically Wellington A. Walker Kehr, M.D.  Electronically Signed    EC/MEDQ  D:  09/30/2008  T:  10/01/2008  Job:  [id number]

## 2010-07-13 NOTE — H&P (Signed)
NAMEKyisha, Denherder Stewart                   ACCOUNT NO.:  [id number]   MEDICAL RECORD NO.:  [id number]          PATIENT TYPE:  REC   LOCATION:  [facility]                         FACILITY:  [facility]   PHYSICIAN:  [name], M.D.    DATE OF BIRTH:  01-21-1924   DATE OF ADMISSION:  10/18/2006  DATE OF DISCHARGE:                              HISTORY & PHYSICAL   DATE OF OFFICE VISIT HISTORY AND PHYSICAL:  October 04, 2006.   CHIEF COMPLAINT:  Right greater than left knee pain.   HISTORY OF PRESENT ILLNESS:  Ms. [name] is an 77 year old female whose  been seen by Dr. Wynelle Link for evaluation of both knees.  She has known  significant arthritis bilaterally and has been on conservative treatment  up until this point and she has been treated in the past with  medications; however, her knees have progressively gotten worse to the  point where she would like to have something done about it.  She is seen  in the office and found to have end-stage arthritis in both knees bone-  on-bone, right is slightly worse than the left. It is felt she would be  a good candidate. The risks and benefits discussed.  The patient is  subsequently admitted to the hospital.   ALLERGIES:  No known drug allergies.   INTOLERANCES:  CODEINE causes sickness.   CURRENT MEDICATIONS:  Advair Diskus, aspirin, Ativan, Darvocet, Flonase  nasal spray, Lasix, Spiriva hand held inhaler, Tums and Nexium.   PAST MEDICAL HISTORY:  Hypertension, sick sinus syndrome, history of  atrial fibrillation status post pacemaker, sacroiliitis, allergic  rhinitis, post herpetic neuralgia, obesity, lumbar spinal stenosis with  a spondylolisthesis of L5-S1, hyperlipidemia, hiatal hernia,  gastroesophageal reflux disease, hemorrhoids, peripheral edema, disk  pathology with radiculopathy, chronic COPD, history of colon polyps,  anxiety, history of a left adrenal adenoma status post nephrectomy,  asthmatic bronchitis.   PAST SURGICAL HISTORY:  Appendectomy,  gallbladder surgery, pacemaker  placement and nephrectomy.   SOCIAL HISTORY:  Widowed, Futures trader, history of smoking off and  on for 56 years, occasional intake of alcohol, 4 children.   FAMILY HISTORY:  Father heart disease, mother with arthritis.   REVIEW OF SYSTEMS:  GENERAL:  No fevers, chills, or night sweats.  NEURO:  No seizures, syncope or paralysis.  RESPIRATORY:  Some shortness  of breath on exertion.  No shortness of breath at rest. No productive  cough or hemoptysis. CARDIOVASCULAR:  History of sick sinus syndrome  status post pacemaker placement, denies angina or orthopnea.  GI:  No  nausea, vomiting, diarrhea, or constipation.  GU: No dysuria, hematuria  or discharge.  MUSCULOSKELETAL:  Right knee.   PHYSICAL EXAMINATION:  VITAL SIGNS:  Pulse 72, respirations 14, blood  pressure 152/84.  GENERAL:  An 77 year old, white female well-nourished, well-developed in  acute distress, slightly overweight. Alert, oriented and cooperative,  very pleasant.  HEENT: Normocephalic, atraumatic.  Pupils are round and reactive.  EOMs  intact.  Oropharynx clear.  Does have upper partial plate.  NECK:  Supple.  CHEST:  Clear anterior and  posterior chest walls.  HEART:  Regular rate and rhythm.  No murmur.  ABDOMEN:  Soft.  Bowel sounds present, nontender.  RECTAL/BREASTS/GENITALIA:  Not done not pertinent to present illness.  EXTREMITIES:  Right knee slight varus malalignment, right moreso than  the left. Range of motion on the right is 5-120, tender more medial than  lateral, no instability.   IMPRESSION:  1. Osteoarthritis right knee.  2. Hypertension.  3. Sick sinus syndrome.  4. Atrial fibrillation.  5. Status post pacemaker placement.  6. Sacroiliitis.  7. Allergic rhinitis.  8. Post herpetic neuralgia.  9. Obesity.  10.Lumbar spinal stenosis with spondylolisthesis L5-S1.  11.Hyperlipidemia.  12.Hiatal hernia.  13.Gastroesophageal reflux disease.   14.Hemorrhoids.  15.Pedal edema.  16.Disk pathology with radiculopathy.  17.Chronic chronic obstructive pulmonary disease.  18.History of colon polyps.  19.Anxiety.  20.History of left adrenal adenoma status post nephrectomy.  21.Asthmatic bronchitis.   PLAN:  The patient admitted to [facility] to undergo a right  total knee replacement arthroplasty.  The surgery will be performed by  Dr. [name].     Alexzandrew L. Perkins, P.A.C.      [name], M.D.  Electronically Signed   ALP/MEDQ  D:  11/17/2006  T:  11/17/2006  Job:  [id number]   cc:   Blane Ohara McDiarmid, M.D.

## 2010-07-13 NOTE — Discharge Summary (Signed)
NAMETristian, Dust Alley                   ACCOUNT NO.:  [id number]   MEDICAL RECORD NO.:  [id number]          PATIENT TYPE:  INP   LOCATION:  2501                         FACILITY:  [facility]   PHYSICIAN:  Audubon A. Walker Kehr, M.D.    DATE OF BIRTH:  29-Jun-1923   DATE OF ADMISSION:  09/30/2008  DATE OF DISCHARGE:  10/03/2008                               DISCHARGE SUMMARY   PRIMARY CARE Elva Breaker:  Blane Ohara McDiarmid, MD at [facility].   DISCHARGE DIAGNOSES:  1. Coronary artery disease.  Occlusions of the right coronary artery      and occlusion of the second obtuse marginal artery.  2. Hypertension.  3. Paroxysmal atrial fibrillation.  4. Sick sinus syndrome with pacemaker.  5. Chronic obstructive pulmonary disease.  6. Metabolic syndrome.  7. Hyperlipidemia.  8. Obesity.  9. Osteoarthritis of the lower extremities and spine.  10.Gastroesophageal reflux disease.  11.History of transitional cell carcinoma of one kidney, status post      nephrectomy.   CONTINUED DISCHARGE MEDICATIONS:  1. Omeprazole 40 mg p.o. daily.  2. Advair 250/50 two puffs 2 times a day.  3. Hydrochlorothiazide 12.5 mg daily.  4. Spiriva 1 capsule inhaled daily.   NEW MEDICATIONS:  1. Simvastatin 20 mg daily.  2. Metoprolol 12.5 mg 2 times a day.  3. Plavix 75 mg daily.  4. Aspirin 325 daily.   CONSULTANTS:  Dr. [name], Cardiology.   ADMISSION LABORATORY DATA:  Admit CBC was within normal limits.  Admit  INR was 0.9.  Admission cardiac markers were negative; however, they  trended up on day #0 and #1 with a max troponin of 0.2, and trending  down with a last recorded cardiac marker of 0.15 on day #1.  Admission  urinalysis was essentially within normal limits with the exception of 15  ketone.   DISCHARGE LABORATORY DATA:  Basic metabolic panel was essentially within  normal limits with the exception of glucose of 103, creatinine of 1.23  note that is an increase over her baseline of 1.00 at  admission.   BRIEF HOSPITAL COURSE:  This is an 77 year old woman with a significant  history of coronary artery disease who presented to the emergency  department with 15 minutes of chest pain unrelieved by nitroglycerin.  She was initially thought to have atypical-type chest pain most likely  indicative of acid-reflux versus Prinzmetal angina versus esophageal  spasm.  Initial cardiac markers were negative as was her admission EKG.  However, her cardiac markers did increase to max of 0.2 as above.  At  this point, her cardiologist, Dr. [name] was notified and upon  consultation the next day, he recommended starting heparin infusion and  treating her as though she had ACS.  She then requested and performed a  cardiac catheterization on October 02, 2008, results above, please see  operative note for further details.  Ms. Armada tolerated the procedure  very well and was discharged on the day after the cardiac  catheterization.  Also significant due to her history of having only one  kidney, she  was hydrated well before her catheterization and hydrated  for the day after renal function.  Her creatinine did slightly increase  after the dye load; however, it is an insignificant increase we expect  it to be resolved fully, which she follows up with her PCP on Monday.  Thus she was thought to have A.C.S., she was started on low-dose beta-  blocker as well as simvastatin for a history of LDL in the office  measuring 90.  She was also started on Plavix and aspirin full-dose  following her cath.  We discussed starting an ACE inhibitor; however, we  will allow her primary care Cristie Mckinney to do that with normalization of  renal function.   FOLLOWUP APPOINTMENTS:  She has an appointment to see Dr. Wendy Poet at  the [facility] on Monday the 9th at 3 p.m. and  she has an appointment to see Dr. [name], her cardiologist on October 13, 2008, at 2:30 p.m.   DISCHARGE CONDITION:  The  patient was discharged to home in stable  medical condition.      Lynne Leader, MD  Electronically Willamina A. Walker Kehr, M.D.  Electronically Signed    EC/MEDQ  D:  10/03/2008  T:  10/03/2008  Job:  [id number]   cc:   Belva Crome, M.D.

## 2010-07-13 NOTE — Cardiovascular Report (Signed)
NAMEDelva, Llera Stewart                   ACCOUNT NO.:  [id number]   MEDICAL RECORD NO.:  [id number]          PATIENT TYPE:  INP   LOCATION:  2501                         FACILITY:  [facility]   PHYSICIAN:  Belva Crome, M.D.   DATE OF BIRTH:  [birth year]   DATE OF PROCEDURE:  10/02/2008  DATE OF DISCHARGE:                            CARDIAC CATHETERIZATION   INDICATIONS:  Acute coronary syndrome, prolonged chest pain with history  of prior negative workup for ischemia.  The patient has significant COPD  and also a solitary kidney following resection of the left kidney for  cancer.   PROCEDURES PERFORMED:  1. Left heart catheterization.  2. Coronary angiography.  3. Left ventriculography.  4. Bare-metal stent, mid right coronary artery.   DESCRIPTION OF PROCEDURE:  The patient was brought to the cath lab after  being post absorptive since midnight.  Versed 1 mg and 50 mcg of  fentanyl was administered.  A 6-French sheath was placed using the  modified Seldinger technique after 1% local infiltration with Xylocaine.  A 6-French A2 multipurpose was then used for hemodynamic recordings,  left ventriculography by hand injection, and selective left and right  coronary angiography.  Because of inability to primarily cannulate the  left coronary, a #4 left Judkins was used.   We reviewed the digital images.  We identified high-grade obstruction in  the mid RCA.  The right coronary distal distribution was huge.  The  entire inferior wall and part of the apex was supplied by the PDA.  There was a second obtuse marginal that arises as part of a trifurcation  with the first obtuse marginal and the continuation of the circumflex  that contains an eccentric greater than 90% stenosis.  This anatomy was  felt to be unfavorable for stenting.  The distribution was moderate.  It  was felt that medical therapy for this lesion was probably the right  approach at this time.  The right coronary was felt to be a  reasonable  candidate for PCI and after discussion with the patient, we proceeded.   Angiomax bolus and infusion was started and achieved an ACT greater than  300 seconds.  The patient was loaded with 600 mg of Plavix.  A 6-French  shepherd crook right coronary catheter was then used to obtain guiding  shots.  I would note that the patient's iliofemoral and distal aortic  region were quite ectatic, making catheter torquing difficult.  We did  achieve good coaxial seeding with the guide catheter chosen.  A BMW wire  was used across the stenosis with minimal difficulty.  Predilatation was  performed with a 2.5 x 15 mm long apex.  We then deployed a 3.5 x 18 mm  long Vision stent to 16 atmospheres.  Two balloon inflations were  performed.  Postdilatation was performed with a 4.0 x 15 mm long Quantum  apex to 16 atmospheres.  This balloon inflation was performed for 46  seconds.  The patient experienced chest tightness during the balloon  inflation.  This quality of discomfort was the similar to her  presenting  symptom of heaviness.  After receiving Plavix, the patient's major  complaint during the procedure was indigestion and burning in her throat  and upper chest.  She was given Pepcid 20 mg IV and also 4 mg of IV  Zofran.   Angio-Seal was used for hemostasis without complications.   RESULTS:  1. Hemodynamic data:      a.     Aortic pressure 156/78.  B..  Left ventricular pressure 161/12.  1. Left ventriculography:  The LV is spade shaped.  The contractility      is low normal at 50%.  2. Coronary angiography:      a.     Left main coronary:  Widely patent.      b.     Left anterior descending coronary:  Widely patent.      c.     Circumflex coronary:  The circumflex coronary artery is a       large vessel that gives 3 very large branches.  The first and       second obtuse marginals arise very close together.  The first       marginal is large and free of obstruction, second obtuse  marginal,       which arises forming the appearance of a trifurcation with the       circumflex, and the first obtuse marginal contains an eccentric       90% stenosis.  The distribution is moderate.  The continuation of       the circumflex is free of any significant obstruction.      d.     Right coronary:  The right coronary artery is large       dominant, supplying the entire inferior wall and the apical       portion of the LV.  There is a midvessel eccentric greater than       95% obstruction within the region of ectasia and irregularity.  3. PCI:  LAD reduced from greater than 95% to 0% with TIMI grade 3      flow after placement of a bare-metal Vision stent postdilated to 16      atmospheres with a noncompliant 4.0-mm balloon.   CONCLUSIONS:  1. Severe double-vessel coronary artery disease with severe      obstruction in the mid right coronary artery and also severe ostial      obstruction of second obtuse marginal, which arises within a      trifurcation from the mid circumflex that is formed by the      continuation of the circumflex and the first obtuse marginal.  The      distribution of the second marginal is moderate.  2. Left ventricular cavity contour suggests apical hypertrophy.      Contractility is normal with ejection fraction of 50%.  No mitral      regurgitation.  3. Successful bare-metal stent implantation in the mid right coronary      from 99% to 0% TIMI grade 3 flow.   PLAN:  1. Medical therapy for now for the obtuse marginal #2 unless symptoms      cause [place] to reconsider PCI.  2. Aspirin and Plavix combination for least a month.  3. Sublingual nitroglycerin for recurrent chest pain.  4. Hopeful discharge, 24-48 hours.      Belva Crome, M.D.  Electronically Signed     HWS/MEDQ  D:  10/02/2008  T:  10/02/2008  Job:  [id number]   cc:   Blane Ohara McDiarmid, M.D.

## 2010-07-13 NOTE — Discharge Summary (Signed)
NAMELaquaya, Kirschbaum [name]                   ACCOUNT NO.:  [id number]   MEDICAL RECORD NO.:  [id number]          PATIENT TYPE:  OBV   LOCATION:  66                         FACILITY:  [facility]   PHYSICIAN:  Jamal Collin. Hensel, M.D.DATE OF BIRTH:  07/15/1923   DATE OF ADMISSION:  12/18/2006  DATE OF DISCHARGE:  12/19/2006                               DISCHARGE SUMMARY   PRIMARY CARE PHYSICIAN:  Blane Ohara McDiarmid, M.D.   CARDIOLOGIST:  Belva Crome, M.D., [facility] and Tillie Rung. Paden:  1. Sick sinus syndrome status post pacemaker.  2. Atrial fibrillation status post pacemaker as above.  3. Chronic obstructive pulmonary disease.  4. Osteoarthritis status post right total knee replacement.  5. Status post nephrectomy with renal cell cancer.  6. Hypertriglyceridemia with low HDL.  7. Hyperglycemia.   DISCHARGE MEDICATIONS:  1. Aspirin 81 mg p.o. b.i.d.  This is an increase from 81 mg daily.  2. Celebrex 200 mg twice a week.  3. Nexium 40 mg daily.  4. Spiriva inhaler daily.  5. Advair one puff b.i.d.  6. ProAir inhaler as needed.  7. Ativan 0.5 mg as needed for anxiety.  8. Darvocet 100 mg as needed for pain.   CONSULTATION:  Belva Crome, M.D., of cardiology.   PROCEDURES:  1. EKG on December 18, 2006, showed atrial pacing in the 60s, possible      old septal infarct of unknown age, flat T-waves.  2. Chest x-ray on December 18, 2006, showed left lower lobe opacity,      possible atelectasis versus infiltrate.  Mild right lower lobe      atelectasis.  3. CT angiography on December 18, 2006, showed no evidence of pulmonary      embolism.  Showed left lower lobe and inferior lingula      atelectasis/scarring.  4. Cardiolite adenosine stress test was negative.   LABORATORY DATA:  Cardiac enzymes negative x3.  D-dimer 2.99.  Sodium  140, potassium 3.8, chloride 107, bicarb 28, BUN 18, creatinine 0.83,  glucose 116, calcium 8.7.  Total  cholesterol 166, triglycerides 159, HDL  23, LDL 111.   BRIEF HOSPITAL COURSE:  PROBLEM #1 -  ATYPICAL ANGINA:  Patient with no  complaints of chest pain during hospital course.  Cardiac enzymes  negative x3.  EKG negative for MI.  CT angiography negative for PE.  Cardiolite stress test negative.  Patient did report some heart flutter  over the past month prior to admission.  Would consider an event monitor  in the outpatient setting to evaluate.   PROBLEM #2 -  ATRIAL FIBRILLATION/SICK SINUS SYNDROME:  Patient with  flutter as described above in the month prior to admission.  No atrial  fibrillation captured during hospital stay.  Consider event monitor as  described above.  We discussed restarting Coumadin with Dr. [name] as  well as with patient.  Per Dr. [name], he has no reservations regarding  restarting Coumadin, however, in discussing with patient, Ms. Sitzer is  resistant to restarting Coumadin due to increased bruising  as well as  some previous episodes of rectal bleeding with hard stools.  At this  time, we will double the patient's aspirin dose to 162 mg p.o. daily and  have the patient follow up with Dr. McDiarmid and Dr. [name] in the  outpatient setting.  Patient with a CHAD-2 score of 1.  Believe aspirin  therapy is appropriate at this time and will discuss with primary care  providers in outpatient setting.   PROBLEM #3 -  CHRONIC OBSTRUCTIVE PULMONARY DISEASE:  Patient was  continued on outpatient regimen.  Maintained sats above 90% on room air.  No dyspnea reported during hospitalization.  Patient will be discharged  on home medications in stable condition.  Will follow up with pulmonary  doctor at [facility]   PROBLEM #4 -  OSTEOARTHRITIS:  Patient on Darvocet for pain.  Celebrex  on an outpatient basis.   PROBLEM #5 -  STATUS POST NEPHRECTOMY:  Patient with good renal function  over hospital course.  Creatinine of 0.83.   PROBLEM #6 -  HYPERGLYCEMIA:  Patient  hyperglycemic to 154 on BMET on  admission.  Patient's blood glucose went down to 116 prior to discharge  on BMET.  Will follow up in outpatient setting with Dr. McDiarmid.   PROBLEM #7 -  HYPERTRIGLYCERIDEMIA:  As stated above in lab results.  No  medications were started on this inpatient stay but will follow up with  Dr. McDiarmid in outpatient setting.  Patient may benefit from starting  Niacin in outpatient setting.   DISCHARGE INSTRUCTIONS:  Patient will follow up with primary care  physician and cardiologist after discharge.  Patient will call primary  care physician if experiencing similar chest pain.   FOLLOW UP APPOINTMENTS:  1. The patient will follow up with Acquanetta Sit, M.D., at [facility] on December 21, 2006, at 4:15 p.m.      Please follow up on hypertriglyceridemia.  Please follow up on      hyperglycemia in hospital.  Please follow up on aspirin regimen for      possible atrial fibrillation on pacer.  Please follow up on      possible event monitor to record any episodes of atrial      fibrillation in outpatient setting.  2. The patient will follow up with Belva Crome, M.D., at [facility]      [facility] on December 26, 2006, at 1:00 p.m.   CONDITION ON DISCHARGE:  Patient was discharged to home in stable  condition, afebrile, vital signs stable, tolerating p.o. intake,  reporting no chest pain.      Sarita Bottom, M.D.  Electronically Signed      Jamal Collin. Andria Frames, M.D.  Electronically Signed    JP/MEDQ  D:  12/21/2006  T:  12/21/2006  Job:  [id number]   cc:   Blane Ohara McDiarmid, M.D.  Belva Crome, M.D.

## 2010-07-13 NOTE — Consult Note (Signed)
[name], [name]                   ACCOUNT NO.:  [id number]   MEDICAL RECORD NO.:  [id number]          PATIENT TYPE:  INP   LOCATION:  [id number]                         FACILITY:  [facility]   PHYSICIAN:  Belva Crome, M.D.   DATE OF BIRTH:  1923-05-07   DATE OF CONSULTATION:  10/19/2006  DATE OF DISCHARGE:                                 CONSULTATION   REASON FOR CONSULTATION:  Postoperative cardiovascular followup and  evaluation of this patient status post right total knee replacement.   CONCLUSION:  1. History of sick sinus syndrome with paroxysmal atrial fibrillation.      Permanent DDD pacemaker.  2. History of chronic obstructive pulmonary disease.  3. Osteoarthritis.  4. Remote history of recurrent chest pain, coronary artery disease      never documented.  5. Right total knee replacement 10/19/2006.   RECOMMENDATIONS:  1. Will continue the patient's usual medical regimen.  Digoxin has      been discontinued as an outpatient.  2. Monitor times 24-36 hours to rule out recurrent      supraventricular/atrial fibrillation arrhythmias.  3. Agree with Coumadin per knee replacement protocol.  4. No specific cardiac evaluation is indicated.   COMMENT:  The patient is 77 years of age, has COPD, and was seen  preoperatively and cleared for surgery.  She has gotten through the knee  replacement operation earlier on the day of evaluation, 0000000,  without complications.  There were no acute complications and no  significant arrhythmias were noted.  She is currently awake and not  having dyspnea or other complaints.  Extubation was easy.   The patient's medications on admission to the hospital included Nexium  40 mg daily, Celebrex 200 mg weekly, Ativan 0.5 mg as needed, Darvocet-N  100 every 4 hours p.r.n. for pain, ProAir 2 puffs per day, Advair 2  puffs per day,  Spiriva 2 puffs per day.   ALLERGIES:  None.   SIGNIFICANT MEDICAL PROBLEMS:  Outlined above.   HABITS:  Prior  smoker.  Quit smoking at least 8 years ago.  Denies  ethanol consumption.   PHYSICAL EXAMINATION:  GENERAL:  On exam the patient is lying supine.  She is in no acute distress.  VITAL SIGNS:  Her blood pressure is 128/65, heart rate was 60.  Monitor  reveals sinus rhythm.  CHEST:  Clear.  CARDIAC:  No murmur.  EXTREMITIES:  No edema.   DIAGNOSTIC AND LABORATORY DATA:  BUN and creatinine are 17 and 0.94.  Chest x-ray revealed chronic lung disease, no cardiomegaly.   ASSESSMENT:  The patient is not complaining of pain.  Overall she feels  she is doing quite well at this time.   Overall the patient is doing well postop.  She should be monitored short-  term post surgery looking for the development of atrial fibrillation.  Otherwise, no specific recommendations at this time.  Will follow  peripherally.  Let us know if we can be of service.      Belva Crome, M.D.  Electronically Signed     HWS/MEDQ  D:  10/19/2006  T:  10/19/2006  Job:  [id number]   cc:   [name], M.D.  Fax: [phone]   Dr. Jamie Kato

## 2010-07-13 NOTE — Consult Note (Signed)
NAMEKIYRA, FAUROT                   ACCOUNT NO.:  [id number]   MEDICAL RECORD NO.:  [id number]          PATIENT TYPE:  OBV   LOCATION:  [id number]                         FACILITY:  [facility]   PHYSICIAN:  Belva Crome, M.D.   DATE OF BIRTH:  [birth year]   DATE OF CONSULTATION:  12/18/2006  DATE OF DISCHARGE:                                 CONSULTATION   CONCLUSIONS:  1. Chest discomfort with radiation to the left back and into the left      jaw, awakening the patient from sleep, etiology uncertain.  Rule      out musculoskeletal discomfort, rule out aortic dissection, rule      out pulmonary embolism, rule out cardiac, rule out other  2. Sick sinus syndrome with prior history of paroxysmal atrial      fibrillation and sinus bradycardia.  The patient has permanent      pacemaker.  3. Hyperlipidemia.  4. COPD.  5. History of prior left nephrectomy.  6. Osteoarthritis with recent left knee replacement.   RECOMMENDATIONS:  1. IV hydration.  2. Agree with spiral chest CT to rule out pulmonary embolism and      aortic dissection.  3. Adenosine Cardiolite study if chest CT is negative.  4. Further evaluation would be dependent upon findings on this      database.   COMMENT:  The patient is 78 and has a history of COPD, paroxysmal atrial  fibrillation, tachybrady syndrome, permanent pacemaker, recent knee  replacement surgery, and history of left nephrectomy.  She has normal  renal function based on creatinine level.  However, her creatinine  clearance is probably less than 50.  She was awakened this morning by  discomfort in her left back, her chest, and in her right jaw.  She took  2 nitroglycerin tablets.  She had never experienced pain like this  before.  The discomfort finally went away after around 30 minutes.  There was no diaphoresis or dyspnea.  No nausea or vomiting.  She has  had prior cardiac evaluations including nuclear testing in the past.  They have been negative.  The  patient has refused prior recommendations  for coronary angiography because of concern about her single kidney and  the possibility of renal impairment.   ALLERGIES:  CODEINE.   MEDICATIONS:  Spiriva, Advair, Nexium, Celebrex.   HABITS:  Longstanding history of smoking, discontinued smoking 2 years  ago.  Rare ethanol consumption.   FAMILY HISTORY:  Possibly CAD and CHF.   PHYSICAL EXAMINATION:  The patient is in no acute distress.  Blood  pressure is 110/60.  Pulses are palpable in both radials.  Extremities  reveal bilateral 1+ posterior tibial pulses.  Heart rate is 62,  respirations are 22, and O2 saturation is 96%.  NECK:  No JVD or carotid bruits.  CHEST:  Clear bilaterally.  No wheezing.  No rales.  CARDIAC:  No murmur.  No rub.  No click.  No gallop.  ABDOMEN:  Soft.  No masses.  EXTREMITIES:  No edema.  NEURO:  Exam is unremarkable.  LABORATORY DATA:  Reveals BUN and creatinine of 25 and 1.1, potassium of  3.6. Hemoglobin of 12.8, platelet count 282,000.  D-dimer 2.99.  Chest x-  ray reveals bilateral atelectasis and possible left lower lobe  infiltrate.  ECG, nonspecific ST-T wave change.   DISCUSSION:  The patient's presentation is that of chest discomfort with  radiation to the left jaw and into the back.  The quality of discomfort  is poorly characterized.  Absence of EKG changes and markers being  negative suggest that this is probably not cardiac in origin, although  there is no way to totally exclude that possibility.  It is reasonable  to exclude pulmonary embolism and aortic dissection given the patient's  presentation and background medical history.      Belva Crome, M.D.  Electronically Signed     HWS/MEDQ  D:  12/18/2006  T:  12/18/2006  Job:  [id number]   cc:   Blane Ohara McDiarmid, M.D.

## 2010-07-13 NOTE — Op Note (Signed)
NAMELatash, [name]                   ACCOUNT NO.:  [id number]   MEDICAL RECORD NO.:  [id number]          PATIENT TYPE:  AMB   LOCATION:  DAY                          FACILITY:  [facility]   PHYSICIAN:  [name], M.D.    DATE OF BIRTH:  1923/03/22   DATE OF PROCEDURE:  07/09/2007  DATE OF DISCHARGE:                               OPERATIVE REPORT   PREOPERATIVE DIAGNOSIS:  Patellar clunk syndrome, right knee.   POSTOPERATIVE DIAGNOSIS:  Patellar clunk syndrome, right knee.   PROCEDURE:  Right knee arthroscopy with synovectomy.   SURGEON:  [name], M.D.   ASSISTANT:  None.   ANESTHESIA:  Spinal.   ESTIMATED BLOOD LOSS:  Minimal.   DRAINS:  None.   COMPLICATIONS:  None.   CONDITION:  Stable to recovery.   BRIEF CLINICAL NOTE:  [name] is an 77 year old female who had a right  total knee arthroplasty done last year.  She did extremely well  postoperatively.  Over the past couple of months she has started to  notice a painful popping when she was getting up from a sitting  position.  This was consistent with the patellar clunk syndrome.  It did  not respond to nonoperative management.  She presents now for  arthroscopy and debridement.   PROCEDURE IN DETAIL:  After successful administration of spinal  anesthetic, a tourniquet was placed on the right thigh and the right  lower extremity prepped and draped in the usual sterile fashion.  Standard superomedial and inferolateral portal incisions were made,  inflow cannula passed superomedial, camera passed inferolateral.  Arthroscopic visualization proceeds.  At the junction of the quad tendon  and the patellar component there is a large hypertrophic mound of  tissue.  The joint is inspected elsewhere and there is no other evidence  of abnormal tissue or inflammation.  The inferomedial portal was  isolated and started and the shaver passed through that.  Any tissue we  could reach from the inferomedial portal was debrided  with the shaver.  I then created a superolateral portal with the ArthroCare, debrided all  of the tissue at the junction of the quad tendon and patellar component.  This effectively cleared out the suprapatellar space.  I then placed the  ArthroCare through the inferomedial portal and cauterized the other  tissue adjacent to this.  The knee is passed through a range of motion  and there was no further popping.  The  joint was again inspected.  No other areas of abnormal appearing tissue.  The arthroscopic equipment is then removed and the portals closed with  nylon.  A bulky sterile dressing is applied and she is awakened and  transported to recovery in stable condition.      [name], M.D.  Electronically Signed     FA/MEDQ  D:  07/09/2007  T:  07/09/2007  Job:  [id number]

## 2010-07-14 ENCOUNTER — Encounter: Payer: Self-pay | Admitting: Family Medicine

## 2010-07-16 NOTE — Discharge Summary (Signed)
NAMEJANEIKA, [name]                   ACCOUNT NO.:  [id number]   MEDICAL RECORD NO.:  [id number]          PATIENT TYPE:  OBV   LOCATION:  [id number]                         FACILITY:  [facility]   PHYSICIAN:  Belva Crome, M.D.   DATE OF BIRTH:  03-13-1923   DATE OF ADMISSION:  07/19/2005  DATE OF DISCHARGE:  07/21/2005                                 DISCHARGE SUMMARY   DICTATED BY:  Raiford Simmonds, PA.   DATE OF ADMISSION:  Jul 19, 2005.   DATE OF DISCHARGE:  Jul 21, 2005.   ADMISSION DIAGNOSIS:  Chest pain.   DISCHARGE DIAGNOSES:  1.  Chest pain status post herpes zoster.  2.  History of tachycardia-bradycardia syndrome status post pacemaker      January 2005.  3.  Chronic obstructive pulmonary disease.  4.  Gastritis.  5.  Tobacco abuse.  6.  History of recurrent atrial fibrillation, currently maintaining normal      sinus rhythm.  7.  RCCA status post left nephrectomy.  8.  Gastroesophageal reflux disease.  9.  Hiatal hernia.  10. Status post cholecystectomy.   PROCEDURES:  Pacemaker interrogation on Jul 20, 2005, revealed no episodes  or changes made.  All programmed values were within normal limits.  Mrs.  Stewart has a metformin device that was implanted in January 2007.   HOSPITAL COURSE:  [name] was admitted to the [facility] on Jul 19, 2005, with a 10-day history of sharp, intermittent chest pain that  occurred after the patient lifted a small dresser to get behind it to get an  item.  Since that time, she had been complaining of electric shocks to the  right breast at the site of her pacemaker.  The chest pain was associated  with nausea and initially radiated to her back.  At its worst, the pain was  rated a 10/10, and upon presentation to the emergency department it was also  rated a 10/10.  The patient expressed concern that her pacemaker wires had  become unplugged, as she described the pain as sharp, intermittent  electric shocks.  An EKG was obtained  while in the emergency department  revealing sinus bradycardia with nonspecific ST changes.  Point of care  enzymes were obtained and were negative x1, with a troponin T of less than  0.05.  Serial cardiac enzymes were obtained and revealed a troponin T of  0.02.  A D-dimer was mildly elevated at 0.52, however, a chest CT was not  obtained.  The patient was admitted for observation as musculoskeletal pain  was suspected.  The next day, a [employer] representative was called in for  pacer interrogation and revealed no episodes or changes made.  All  programmed values were within normal limits.  Dr. [name] spoke with the  patient's primary care physician, Dr. Blane Ohara McDiarmid, M.D., as the  patient had seen Dr. McDiarmid in his office several days prior to being  admitted to the emergency department, and had received a diagnosis of  gastritis and was prescribed Prevacid.  Dr. [name]  requested a consult from  Dr. McDiarmid.  A consult was performed and a diagnosis of acute herpes  zoster in the right thoracic dermatome was obtained.  The patient was  started on corticosteroids and valacyclovir for good pain control.  The  patient was noted to be in stable condition from Dr. McDiarmid's standpoint.  Dr. McDiarmid agreed to continue to follow the patient post discharge from  the hospital, and agreed to see her in two to three weeks post discharge.  He also started her on Vicodin for pain, and also agreed to give her refills  for her home medications, which included Flexeril, Lasix and Celebrex.  He  and Dr. [name] discussed the patient in detail, and the patient was  discharged to home in stable condition on Jul 21, 2005.   LABORATORY DATA:  Sodium 141, potassium 3.8, chloride 107, glucose 106, BUN  15, creatinine 0.9.  Point of care cardiac markers:  Myoglobin 53.5, CK-MB  1, troponin I less than 0.05.  Serial cardiac markers are as follows:  CK  total 51, 39, and 34 respectively; CK-MB 1.2, 1.1,  and 0.9 respectively;  troponin I 0.02, 0.02, and 0.01 respectively.  Digoxin level less than 0.2.  D-dimer 0.52.  White blood count 8.6, hemoglobin 12.1, hematocrit 35.1,  platelet count 209.   RADIOGRAPHIC STUDIES:  PA and lateral chest x-ray on Jul 19, 2005, revealed  stable borderline-to-mild cardiomegaly.  COPD.  Scarring in the upper lobes.  No acute cardiopulmonary disease.   ELECTROCARDIOGRAM:  EKG Jul 19, 2005:  Normal sinus rhythm with first-degree  AV block with a ventricular rate of 60 beats per minute with nonspecific ST-  and T-wave changes, and this is consistent with an EKG that was performed on  the next day, Jul 20, 2005.   DISCHARGE CONDITION:  [name] was discharged to home in stable condition  without any evidence of cardiac chest pain, shortness of breath, nausea,  vomiting, diaphoresis, dizziness or syncope.  Her chest pain was located in  the right dermatomal area secondary to herpes zoster.   DISCHARGE MEDICATIONS:  1.  Vicodin 5/325 one-half to one tablet every six hours as needed for pain.      This was a new prescription.  A prescription was given with refills.  2.  Flexeril 10 mg one-half tablet every eight hours as needed.  3.  Lasix 40 mg daily as needed.  4.  Celebrex 200 mg daily as needed.  5.  Prednisone 20 mg three times daily.  This was a new prescription.  A      prescription was given with refills.  6.  Valacyclovir 1000 mg three times daily for seven days.  This was a new      prescription.  A prescription was given with refills.  7.  Lanoxin 0.125 mg daily.  8.  Spiriva daily.  9.  Advair inhaler one puff twice daily.  10. ProAir (albuterol dosage as prior to admission).  11. Darvocet-N 100 as needed for pain.   DISCHARGE INSTRUCTIONS:  The patient has been instructed to call Dr.  McDiarmid for recurrent dermatomal pain secondary to herpes zoster.   FOLLOWUP ARRANGEMENTS:  The patient has been instructed to call Dr. Sherren Mocha McDiarmid  today for a followup appointment in two to three weeks.  It should  be noted that all prescriptions above were prescribed by Dr. McDiarmid.      Belva Crome, M.D.  Electronically Signed     HWS/MEDQ  D:  08/22/2005  T:  08/22/2005  Job:  [id number]   cc:   Blane Ohara McDiarmid, M.D.  Fax: [phone]

## 2010-07-16 NOTE — H&P (Signed)
NAME:  NEKAYBAW, KARTER                             ACCOUNT NO.:  [id number]   MEDICAL RECORD NO.:  [id number]                   PATIENT TYPE:  INP   LOCATION:  2024                                 FACILITY:  [facility]   PHYSICIAN:  Belva Crome, M.D.                DATE OF BIRTH:  1924/01/15   DATE OF ADMISSION:  07/17/2003  DATE OF DISCHARGE:                                HISTORY & PHYSICAL   HISTORY OF PRESENT ILLNESS:  [name] is a 77 year old white woman who is  admitted to [facility] for further evaluation of chest pain.   The patient has no history of coronary artery disease.  She was last  hospitalized here for chest pain in January of this year.  An acute  myocardial infarction was excluded.  She had an adenosine Cardiolite in  August of 2004 which was negative for ischemia.   The patient does have a history of tachybrady syndrome and recurrent atrial  fibrillation.  She underwent implantation of a DDD pacemaker for this  problem in January of this year.  She is apparently scheduled to undergo  some repositioning of her leads tomorrow by Dr. Champ Mungo. Lovena Le.   The patient presented to the emergency department after experiencing an  episode of chest pain at home.  This occurred while she was sedentary.  The  chest discomfort was described as a heaviness or pulsation in her upper sub-  sternum and in her right neck.  It was associated with mild dyspnea but no  diaphoresis or nausea.  It ultimately resolved in approximately 45 minutes.  She did take 3 nitroglycerin tablets but is uncertain as to whether they  were the cause of the pain's resolution.  The discomfort appeared to be  unrelated to position, activity, meals or respirations.  There were no  exacerbating or ameliorating factors.  It did not radiated, other than as  described above.   There is no history of congestive heart failure, coronary artery disease, or  myocardial infarction.   PAST MEDICAL HISTORY:   In addition to the aforementioned problems, the  patient has a history of asthma.  In addition, she underwent left  nephrectomy some years ago for renal cell carcinoma.  She has no other  medical problems.   CURRENT MEDICATIONS:  Her current medications include Flovent, prednisone,  and Coumadin, although the Coumadin was discontinued last week in  anticipation of the pacemaker lead procedure tomorrow.   ALLERGIES:  She is reportedly allergic to CODEINE.   PREVIOUS OPERATIONS:  Cholecystectomy, in addition to the left nephrectomy  described above.   FAMILY HISTORY:  The patient's father died at age 51 of a myocardial  infarction.  Her mother died at age 39 of natural causes.   SOCIAL HISTORY:  The patient is a widow.  She lives with her daughter.  She  has a past  history of significant tobacco use.  She drinks occasionally.   REVIEW OF SYSTEMS:  Review of systems reveals no new problems related to her  head, eyes, ears, nose, mouth, throat, lungs, gastrointestinal system,  genitourinary system, or extremities.  There is no history of neurologic or  psychiatric disorder.  There is no history of fever, chills, or weight loss.   PHYSICAL EXAMINATION:  VITAL SIGNS:  Blood pressure 126/62.  Pulse 57 and  regular.  Respirations 24.  Temperature 99.0.  GENERAL:  The patient was an elderly white woman in no discomfort.  She was  alert, oriented, appropriate, and responsive.  HEENT:  Head, eyes, nose, and mouth were normal.  NECK:  The neck was without thyromegaly or adenopathy.  Carotid pulses were  palpable bilaterally and without bruits.  CARDIAC:  Examination revealed a normal S1 and S2.  There was no gallop,  rub, click, or murmur.  Cardiac rhythm was regular.  No chest wall  tenderness was noted.  LUNGS:  The lungs were clear.  ABDOMEN:  The abdomen was soft and nontender.  There was no mass,  hepatosplenomegaly, bruit, distention, rebound, guarding, or rigidity.  Bowel sounds were  normal.  BREAST, RECTAL AND GENITAL:  Examinations were not performed as they were  not pertinent to the reason for acute care hospitalization.  EXTREMITIES:  The extremities were without edema, deviation, or deformity.  Radial and dorsalis pedal pulses were palpable bilaterally.  NEUROLOGIC:  Brief screening neurologic survey was unremarkable.   DATA:  The electrocardiogram revealed an ectopic atrial rhythm as evidenced  by an  unusual T axis.  The rate was 59 beats per minute.  There were  nonspecific ST and T wave changes.   The chest radiograph was pending at the time of this dictation.   Potassium was 4.1, BUN 26, and creatinine 1.2.  White count was 13.4 with a  hemoglobin of 10.9 and hematocrit of 33.0.  INR was 1.0.  The initial set of  cardiac markers revealed a myoglobin of 39.1, CK-MB 2.3, and troponin less  than 0.05.  The second set of cardiac markers revealed a myoglobin of 40.3,  CK-MB of 1.3 and troponin less than 0.05.  The remaining studies were  pending at the time of this dictation.   IMPRESSION:  1. Chest pain, rule out myocardial infarction.  No past history of coronary     artery disease; adenosine Cardiolite negative in August 2004.  2. Tachybrady syndrome; history of atrial fibrillation; permanent pacemaker.  3. Status post left nephrectomy for renal cell cancer.  4. Asthma.   PLAN:  1. Telemetry.  2. Serial cardiac enzymes.  3. Heparin.  Coumadin was discontinued last week in anticipation of a     pacemaker lead procedure tomorrow.  4. Nitrol paste.  5. Further measures per Dr. Belva Crome.      Virgilio Belling. Ezzie Dural, MD                   Belva Crome, M.D.    MSC/MEDQ  D:  07/18/2003  T:  07/18/2003  Job:  [id number]   cc:   Sinclair Grooms, M.D.  [place]. [employer]  [address]  [place] [address]  Fax: [phone]

## 2010-07-16 NOTE — Op Note (Signed)
NAMEGERALDENE, HOLK                             ACCOUNT NO.:  [id number]   MEDICAL RECORD NO.:  [id number]                   PATIENT TYPE:  OIB   LOCATION:  2037                                 FACILITY:  [facility]   PHYSICIAN:  Champ Mungo. Lovena Le, M.D.               DATE OF BIRTH:  05/09/1923   DATE OF PROCEDURE:  07/31/2003  DATE OF DISCHARGE:                                 OPERATIVE REPORT   ELECTROPHYSIOLOGY PROCEDURE NOTE:   PROCEDURE PERFORMED:  Pacemaker pocket hematoma.   INDICATIONS FOR PROCEDURE:  Large pocket hematoma with pending rupture of  the pocket.  [name] is a 77 year old woman with tachy-brady syndrome who  underwent permanent pacemaker insertion, initially back again January of  2005.  She had pacemaker generator dislodgment and migration with subsequent  withdrawal of the atrial and ventricular pacing leads such that the atrial  lead was non-capturing and the ventricular lead was merely out of the right  ventricle.  The patient underwent pacemaker pocket and lead revision back  with lead replacement, back two weeks ago and did well with this.  She  developed right upper extremity and underwent Coumadin therapy.  She has now  developed a permanent pacemaker hematoma which is tense and pending rupture  and is now referred for pacemaker pocket hematoma evacuation.   DESCRIPTION OF PROCEDURE:  After informed consent was obtained, the patient  was taken to the diagnostic EP procedure in the fasting state.  After  appropriate prepping and draping, intravenous fentanyl and midazolam were  given for sedation.  A total of 30 mL of lidocaine was infiltrated into the  right infraclavicular region.  A 6-cm incision was carried out over this  region.  Electrocautery was utilized to dissect down to the fascial plane.  The old pocket was entered and the pocket hematoma was evacuated.  There was  evidence of active bleeding and the areas of bleeding were cauterized with  electrocautery.  The patient was clearly hemopathic and it was quite  difficult to maintain hemostasis.  The pocket was irrigated with kanamycin  and the device placed back in the pocket with additional irrigation.  The  incision was closed with a layer of 2-0 Vicryl followed by 3-0 Vicryl,  followed by a layer of 4-0 Vicryl.  Benzoin was painted on the skin and  Steri-Strips were applied and a pressure dressing was placed.  The patient  was then returned to her room in satisfactory condition.   COMPLICATIONS:  There were no immediate procedure complications.   RESULTS:  The results demonstrate successful pacemaker pocket revision in a  patient with large pocket hematoma.  Champ Mungo. Lovena Le, M.D.    GWT/MEDQ  D:  07/31/2003  T:  07/31/2003  Job:  [id number]   cc:   Belva Crome III, M.D.  [place]. [employer]  [address]  [place] [address]  Fax: [phone]

## 2010-07-16 NOTE — H&P (Signed)
NAMERyka, Butkowski Elexius                   ACCOUNT NO.:  [id number]   MEDICAL RECORD NO.:  [id number]          PATIENT TYPE:  INP   LOCATION:  5041                         FACILITY:  [facility]   PHYSICIAN:  Arnette Norris, M.D.       DATE OF BIRTH:  1923/04/17   DATE OF ADMISSION:  02/27/2006  DATE OF DISCHARGE:                              HISTORY & PHYSICAL   ATTENDING:  Alpha A. Walker Kehr, M.D.   PRIMARY CARE Shavana Calder:  Blane Ohara McDiarmid, M.D.   CHIEF COMPLAINT:  Increased cough x1 week.   HISTORY OF PRESENT ILLNESS:  Mrs. Kanter is a pleasant patient with a  history of COPD who presented to Urgent Care today with a one week  history of cough, runny nose, and yellowish sputum production.  Patient  complains also of a headache x2 days, which is the worst headache of her  life in the right temporal region, which has since subsided after taking  Darvocet.  She denied any visual changes, any nausea, any vomiting, any  diarrhea or any trauma to her head.  She was sent to [facility] for a chest x-ray to rule out pneumonia.  She was desaturating  in the high 80s on room air at Urgent Care.   REVIEW OF SYSTEMS:  No fevers.  No chills.  CVS:  No chest pain.  Pulmonary:  Positive cough.  No blood-tinged sputum.  GI:  No nausea,  vomiting or diarrhea.   PAST MEDICAL HISTORY:  1. Hyperlipidemia.  2. COPD.  3. GERD.  4. Osteoarthritis.  5. Atrial fibrillation.  6. Sick sinus syndrome.  7. History of transitional cell cancer, status post nephrectomy.   MEDICATIONS:  1. AcipHex 20 mg daily p.r.n.  2. Advair Diskus 25 per 50 INH b.i.d.  3. Ativan 0.5 mg b.i.d. p.r.n. shortness of breath.  4. Celebrex 200 mg daily.  5. Darvocet.  6. Digoxin 0.125 mg daily.  7. Spiriva inhaled daily.   ALLERGIES:  TO CODEINE, ALSO SINGULAIR GIVES HER A HEADACHE.   FAMILY HISTORY:  Father died of an MI in his early 33s.   SOCIAL HISTORY:  Patient is a widow of a Health visitor judge and lives  alone.  She has four daughters who are very involved.  She is a semi-  retired Futures trader.  She has a home nebulizer for inhalation  therapy.   PHYSICAL EXAMINATION:  VITAL SIGNS:  Temperature is 97.1, pulse is 88,  respiratory rate is 22, blood pressure 130/70, O2 saturation 88-90% on  room air.  GENERAL:  Patient is alert and in no acute distress.  MENTAL STATUS:  She is oriented to person, place and time.  Pupils  equal, round and reactive to light.  PULMONARY:  There is yellowish sputum visible in her base and she has  expiratory wheezes with poor respiratory effort.  CVS:  Regular rate and rhythm.  ABDOMEN:  Soft, nontender.  Positive bowel sounds.  Nondistended.  EXTREMITIES:  No edema.  Pulses:  2+ pulses.   LABORATORY DATA:  Chest x-ray:  No  acute findings.   ASSESSMENT AND PLAN:  This is an 77 year old female with a history of  COPD here with:  1. Shortness of breath with cough x1 week.  This is likely secondary      to COPD exacerbation, increased cough and yellowish sputum      production.  Patient afebrile but has increased oxygen requirement,      desaturating to 88% on room air.  We will admit to family practice      service for 23 hour observation and treat exacerbation with      prednisone, doxycycline and p.r.n. nebulizers.  We will also      continue her home meds of Advair Diskus and Spiriva.  We will also      check a CBC, UA and urine culture and rule out as source of      infection.  2. Atrial fibrillation, rate controlled.  Continue digoxin.  Patient      not taking Coumadin as it makes her bruise easily.  3. Osteoarthritis, currently stable.  Continue home regimen of      Celebrex and Darvocet.  4. FEN/GI:  A heart healthy diet.  5. Prophylaxis:  Protonix and SCDs.  6. Code status:  Full code.           ______________________________  Arnette Norris, M.D.     TA/MEDQ  D:  02/27/2006  T:  02/28/2006  Job:  [id number]

## 2010-07-16 NOTE — Discharge Summary (Signed)
[name], [name]                             ACCOUNT NO.:  [id number]   MEDICAL RECORD NO.:  [id number]                   PATIENT TYPE:  INP   LOCATION:  2012                                 FACILITY:  [facility]   PHYSICIAN:  Belva Crome, M.D.                DATE OF BIRTH:  January 10, 1924   DATE OF ADMISSION:  03/01/2003  DATE OF DISCHARGE:  03/06/2003                                 DISCHARGE SUMMARY   ADMISSION DIAGNOSES:  1. Chest pain, rule out myocardial infarction.  2. Atrial fibrillation with rapid ventricular response.  3. Asthma.  4. History of nephrectomy.   DISCHARGE DIAGNOSES:  1. Chest pain, negative for myocardial infarction.  2. Atrial fibrillation with rapid ventricular response, resolved.  3. Tachybradycardia syndrome.  4. Status post DDD pacemaker implant, March 04, 2003.   PROCEDURE:  Pacemaker implant March 04, 2003.   COMPLICATIONS:  None.   CONDITION ON DISCHARGE:  Discharge status stable, improved.   HISTORY OF PRESENT ILLNESS:  For admission history please see complete  history and physical examination report for details.  In short, this is a 77-  year-old female who has history of renal cell carcinoma and is status post  nephrectomy.  Also has a history of asthma.  She presented to the emergency  room on March 01, 2003 after approximately two to three hour episode of  chest pain that began after going out to dinner and watching a movie.  This  began around midnight while talking on the phone.  She also had noticeable  palpitations.  Denied nausea, shortness of breath or diaphoresis.  EMS was  called.  She was noted to be in atrial fibrillation with rapid ventricular  response.  She was given aspirin as well as sublingual nitroglycerin by EMS  which relieved her discomfort.  She converted on her own back to a normal  sinus rhythm while in the emergency room.   For physical examination on admission please see the complete history and  physical report  on chart for details.  Her vital signs were stable.  Heart  rate was in the 50's once converted.  She was noted to be in the 110's by  EMS.   LABORATORY DATA:  Her admission labs were normal including potassium,  hemoglobin and hematocrit.  Cardiac markers were negative X3 sets.  Electrocardiogram on admission showed a normal sinus rhythm with PAC's and  nonspecific ST and T wave changes.   HOSPITAL COURSE:  The patient was admitted to rule out myocardial infarction  as well as monitoring for recurrent atrial fibrillation.  TSH was checked as  well as a 2-dimensional echocardiogram.  She was maintained on telemetry.  The patient had no recurrent atrial fibrillation.  She did have some  bradycardia with heart rates down into the 40's and 50's.  TSH was normal.  Serial cardiac enzymes were all normal.  She had no further complaints of  chest discomfort.  Her dose of Toprol was decreased at this point secondary  to bradycardia.   At this point she was presumed to have tachybradycardia syndrome.  She had  actually undergone a Cardiolite study in August of 2004 which was completely  normal.   The 2-dimensional echocardiogram was done on March 03, 2003.  This showed  mild left atrial enlargement at 4.7 cm.  There were no valvular  abnormalities.  Left ventricle had no wall motion abnormalities noted.   At this point plans were made to have a permanent pacemaker placed.  Dr.  Doreatha Lew actually performed this procedure on January 4, [id number] without  complications.  For the rest of her hospitalization her vital signs remained  stable.  She had no recurrent atrial fibrillation.  Pacemaker interrogation  on March 04, 2003 showed normal functioning.  She was loaded on Coumadin.  A low dose beta blocker was also continued.   She did have some mild pleuritic chest pain post pacemaker placement that  resolved easily without treatment.  By March 06, 2003 the patient was  complaining of some urinary  frequency.  She was started on fluoroquinolone  at this point.   The patient was discharged home on March 07, 2003 without incident.  Her  INR at discharge was 1.7. She will go to the Coumadin clinic at [facility] in a couple of days for repeat INR.   DISCHARGE MEDICATIONS:  1. Coumadin 5 mg X1 day then 2.5 mg daily at suppertime.  2. Toprol XL 25 mg daily.  3. Her inhalers as used previously for her asthma.   DISCHARGE INSTRUCTIONS:  The patient is instructed not to drive for one  week.  She is not to do any lifting, pulling or tugging and is to carry her  purse with right arm for the next two weeks.  She is to keep the pacemaker  insertion site clean and dry.  She is instructed to let the Steri-Strips  fall off on their own.  She may bathe around the area and she is to pat the  area dry.  She is instructed not to rub the area dry.   FOLLOW UP:  She has an appointment at the Coumadin clinic on Tuesday March 11, 2003.  She has a follow up appointment with Dr. [name] on Friday, March 14, 2003 at 11:00 A.M.      Verlon Au, N.P.                        Belva Crome, M.D.    HB/MEDQ  D:  04/09/2003  T:  04/09/2003  Job:  [id number]

## 2010-07-16 NOTE — H&P (Signed)
[name], [name]                             ACCOUNT NO.:  [id number]   MEDICAL RECORD NO.:  [id number]                   PATIENT TYPE:  INP   LOCATION:  2012                                 FACILITY:  [facility]   PHYSICIAN:  Ludwig Lean. Doreatha Lew, M.D.            DATE OF BIRTH:  06-18-1923   DATE OF ADMISSION:  03/01/2003  DATE OF DISCHARGE:                                HISTORY & PHYSICAL   CHIEF COMPLAINT:  Chest heaviness with palpitations.   HISTORY OF PRESENT ILLNESS:  The patient is a very-pleasant 77 year old  white female who basically has no cardiac history.  She presents to the  emergency room per EMS with an episode of chest pain and palpitations.  She  had been out to dinner at the [facility] for an approximately three-hour  dinner.  Prior to eating, she had two [place] ice teas.  She did well at  dinner and was able to go home and watch a movie.  At approximately  midnight, she was on the phone talking with friends when she had the onset  of chest pressure and palpitations.  She felt as if someone were beating  me.  She did note that her heart rate was quite fast.  She felt somewhat  dizzy.  She had no frank syncope.  She subsequently had aching to her left  arm and called EMS.  She was given nitroglycerin and aspirin and was noted  to be in atrial fibrillation with a rapid ventricular response.  She was  subsequently brought to the emergency room where she converted  spontaneously.  She subsequently was admitted for further evaluation.  Her  chest pain syndrome lasted for approximately two to three hours and is now  resolved.   PAST MEDICAL HISTORY:  1. Asthma, followed by Dr. Asencion Noble.  2. History of left nephrectomy secondary to renal cell carcinoma, followed     by Dr. Jori Moll L. Davis.  3. Osteoarthritis.   She has no reports of diabetes, heart attack, hypertension, stroke, or  elevated lipids.  She had an episode of atypical chest pain in August of  2004  with a negative Cardiolite per Dr. Belva Crome.   ALLERGIES:  CODEINE.   MEDICATIONS:  Inhalers x2.   FAMILY HISTORY:  Father died at 66 with a heart attack.  Mother died at 62  of natural causes.   SOCIAL HISTORY:  She is a widow.  She lives with her daughter.  She does  have longstanding tobacco use.  She states she quit approximately one week  ago, but she did have two cigarettes at dinner last night.  She does have  social alcohol use.   REVIEW OF SYSTEMS:  No recent fever or flu.  She did have a recent bout of  bronchitis and received steroid injection.  She has chronic trouble with her  bowel.  She does  have complaints of occasional edema and uses p.r.n. Lasix.  She has previously had no cardiac complaints.  Otherwise review of systems  is as noted, but otherwise unremarkable.   PHYSICAL EXAMINATION:  GENERAL:  She is a very-pleasant, talkative white  female, in no acute distress.  VITAL SIGNS:  Blood pressure 109/61, heart rate is in the 50's.  O2  saturation is 98%.  SKIN:  Warm and dry.  Color is unremarkable.  LUNGS:  Clear.  HEART:  Regular rhythm.  ABDOMEN:  Obese, soft, positive bowel sounds, nontender.  EXTREMITIES:  Without edema.  NEUROLOGIC:  Intact.  There are no gross focal deficits.   LABORATORY DATA:  Pertinent labs:  Cardiac markers were negative x3.  Potassium is 3.6.  Hemoglobin 12, hematocrit 37.  EKG currently shows normal  sinus rhythm with PAC's and nonspecific changes.   OVERALL IMPRESSION:  1. Chest pain of two to three hour duration with left arm radiation.  2. Atrial fibrillation, now resolved.  3. History of asthma.  4. History of nephrectomy.   PLAN:  1. She will be admitted to the service of Dr. Belva Crome.  2. She will need to have a 2-D echocardiogram.  3. We will check thyroid studies as well.  4. We will monitor her on telemetry.      Donnel Saxon C. Maxwell Caul, N.P.                 Ludwig Lean. Doreatha Lew, M.D.    LCO/MEDQ  D:   03/01/2003  T:  03/01/2003  Job:  [id number]   cc:   Sinclair Grooms, M.D.  [place]. [employer]  [address]  [place]  [place] [address]  Fax: [phone]   Asencion Noble, M.D. [facility]   Blane Ohara McDiarmid, M.D.  [place]. [place]  [place] [address]  Fax: [phone]

## 2010-07-16 NOTE — Procedures (Signed)
[facility]  Patient:    [name], [facility]                            MRN: [id number] Proc. Date: 06/07/00 Attending:  Cleotis Nipper, M.D. CC:         Merlinda Frederick McDiarmid, M.D.  Dillon Parks Neptune, M.D.   Procedure Report  PROCEDURE:  Colonoscopy with polypectomy and biopsies.  ENDOSCOPIST:  Cleotis Nipper, M.D.  INDICATION:  [age]-year-old female for colon cancer screening.  FINDINGS:  Several polyps removed.  DESCRIPTION OF PROCEDURE:  The nature, purpose and risks of the procedure had been discussed with the patient, who provided written consent.  Sedation was Fentanyl 50 mcg and Versed 5 mg IV, without arrhythmias or desaturation.  The Olympus adult video colonoscope was advanced fairly easily around the colon to the cecum, applying some external abdominal compression to facilitate advancement.  The cecum was identified by clear visualization of the appendiceal orifice.  The quality of the prep was excellent and it is felt that all areas were well-seen.  There were several polyps encountered and removed on this exam.  The first was a 3- to 4-mm semi-pedunculated polyp at about 25 cm removed by snare technique.  Then, there were a couple of small polyps cold-biopsied at about 60 cm.  There was a 4-mm semi-pedunculated polyp in the transverse colon removed by snare technique and retrieved by suctioning through the scope, and finally a couple of diminutive sessile polyps in the cecum, one appearing to be right at the appendiceal orifice, biopsied.  There was also a 3- to 4-mm sessile hyperplastic-appearing polyp just proximal to the anal verge, which I biopsied a couple of times.  Retroflexion in the rectum prior to the biopsy of the distal rectal polyp showed a couple of diminutive sessile polyps but no major lesions.  The patient had minimal internal hemorrhoidal tissue and a hypertrophied anal papilla seen during pullout through the  anal canal.  No colitis, vascular malformations or diverticulosis, large polyps or cancer were seen during this exam.  The patient tolerated the procedure well and there were no apparent complications.  There was good hemostasis and no evidence of excessive cautery at the various polypectomy sites.  IMPRESSION:  Several colon polyps removed as described above.  PLAN:  Await pathology.  Followup colonoscopy in three years if more than one adenoma is present. DD:  06/07/00 TD:  06/07/00 Job: [id number]

## 2010-07-16 NOTE — Discharge Summary (Signed)
NAMEAdina, [name]                   ACCOUNT NO.:  [id number]   MEDICAL RECORD NO.:  [id number]          PATIENT TYPE:  INP   LOCATION:  5041                         FACILITY:  [facility]   PHYSICIAN:  [name], M.D.DATE OF BIRTH:  [birth year]   DATE OF ADMISSION:  02/27/2006  DATE OF DISCHARGE:  03/01/2006                               DISCHARGE SUMMARY   PRIMARY CARE PHYSICIAN:  Dr. McDiarmid.   REASON FOR ADMISSION:  Evaluation and treatment of chronic obstructive  pulmonary disease exacerbation.   HPI:  In brief, this is an 77 year old female with a history of COPD who  presented with a 1 week history of dyspnea, cough with increased sputum  production that turned yellow.  She presented to the urgent care on  December 31, at which time her oxygen saturation was dropping into the  80s.  She was then referred to [facility] for a chest  x-ray.  Chest x-ray did not show pneumonia, but she continued to  desaturate in the emergency room and required increased oxygen.  She is,  therefore, admitted for evaluation and treatment of COPD exacerbation.   DISCHARGE MEDS:  1. Prednisone 40 mg 1 tab p.o. x10 days.  2. Doxycycline 100 mg b.i.d. x14 days.  3. Celebrex 200 mg daily.  4. Advair 1 puff b.i.d.  5. Spiriva 1 puff daily.  6. Albuterol p.r.n.   DISCHARGE DIAGNOSES:  1. Chronic obstructive pulmonary disease exacerbation.  2. Atrial fibrillation.  3. Anxiety.  4. Gastroesophageal reflux disease.  5. Osteoarthritis.   DISCHARGE LABS:  Creatinine of 1.1, sodium of 142.  TSH of 0.866.  White  count 11.3, hemoglobin of 13.2, hematocrit of 39.7.  Chest x-ray showed  no acute findings, stable cardiomegaly with vascular congestion, no  edema.   HOSPITAL COURSE:  1. Shortness of breath with cough x1 week.  This is thought to be      likely secondary to COPD exacerbation with increased cough and      yellowish sputum production.  Patient was afebrile on  admission;      however, she did have an increased oxygen requirement and she is      desaturating at 88% on room air.  Her chest x-ray showed no acute      findings and it was negative for pneumonia.  We, therefore,      admitted her for COPD exacerbation and treated her with prednisone,      doxycycline and p.r.n. nebs.  We also continued her home meds of      Advair Diskus and Spiriva.  We also ruled her out for an MI, which      she did and her UA was negative for infection.  EKG was also within      normal limits.  By the second day of admission, her oxygen      requirement decreased and she was saturating in the 90s on room air      at rest and while ambulating.  We, therefore, discontinued her      oxygen nasal  cannula and continued her Advair, prednisone,      doxycycline and Spiriva.  She is still continuing to wheeze at      discharge; however, it is much improved.  2. Atrial fibrillation was rate controlled throughout her entire      admission.  We just continued her on digoxin.  3. Anxiety.  We just continued her home regimen of Ativan p.r.n., she      was stable throughout her admission.  4. Osteoarthritis.  We just continued her Celebrex and Darvocet per      her home regimen.  We did not change anything as she was stable      throughout her admission.   Patient is to follow up with Dr. McDiarmid at [id number] on Thursday,  March 03, 2006 at 4:15 p.m.     ______________________________  [name], M.D.    ______________________________  [name], M.D.    TA/MEDQ  D:  03/01/2006  T:  03/01/2006  Job:  [id number]   cc:   [name], M.D.  [name], M.D.

## 2010-07-16 NOTE — Discharge Summary (Signed)
[name], GHUMAN                             ACCOUNT NO.:  [id number]   MEDICAL RECORD NO.:  [id number]                   PATIENT TYPE:  OIB   LOCATION:  2037                                 FACILITY:  [facility]   PHYSICIAN:  Champ Mungo. Lovena Le, M.D.               DATE OF BIRTH:  [birth year]   DATE OF ADMISSION:  07/31/2003  DATE OF DISCHARGE:  08/01/2003                                 DISCHARGE SUMMARY   PRIMARY DIAGNOSES:  Pacemaker pocket hematoma.   HISTORY OF PRESENT ILLNESS:  This is a 77 year old woman with tachybrady  syndrome who underwent permanent pacemaker insertion initially back in  January of 2005.  She had a pacemaker generator dislodgement and migration  with subsequent withdrawal of the atrial and ventricular pacing leads such  that the atrial lead was not capturing, and the ventricle lead was nearly  out in the right ventricle.  The patient underwent pacemaker pocket and lead  revision with a lead replacement two weeks ago, and did well.  She developed  a right upper extremity thrombus and underwent Coumadin therapy.  She now  developed a pacemaker pocket hematoma which is tense and pending rupture and  was referred for pacemaker pocket hematoma evacuation.  She underwent  evacuation of the hematoma on June 2.  She tolerated the procedure well, and  had no immediate postoperative complications.  She was discharged the  following day in stable condition.   DISCHARGE MEDICATIONS:  She was discharged on all of her previous  medications which include:  1. Digoxin 0.125 daily.  2. Flovent two puffs b.i.d.  3. Furoate 12 micrograms b.i.d.  4. Coumadin 2.5 mg daily.  5. She was to take Tylenol one to two tablets every four to six hours as     needed.   DISCHARGE INSTRUCTIONS:  1. No heavy lifting or strenuous activity for six to eight weeks with her     right arm.  2. She was not to raise her right arm above her head for one week, and not     to get the incision wet for  one week.  3. She was to follow up with Dr. Eligah East as previously scheduled, next     Wednesday, and have her INR checked at that time.      Forest Becker, C.R.N.P. [facility]                 Champ Mungo. Lovena Le, M.D.    DS/MEDQ  D:  08/01/2003  T:  08/03/2003  Job:  [id number]   cc:   Champ Mungo. Lovena Le, M.D.   Belva Crome III, M.D.  [facility] [employer]  [address]  [place] [address]  Fax: [phone]

## 2010-07-16 NOTE — Op Note (Signed)
NAMELIVANA, OBERLY                             ACCOUNT NO.:  [id number]   MEDICAL RECORD NO.:  [id number]                   PATIENT TYPE:  INP   LOCATION:  2024                                 FACILITY:  [facility]   PHYSICIAN:  Champ Mungo. Lovena Le, M.D.               DATE OF BIRTH:  December 17, 1923   DATE OF PROCEDURE:  07/18/2003  DATE OF DISCHARGE:  07/20/2003                                 OPERATIVE REPORT   PROCEDURE PERFORMED:  Pacemaker lead extraction in both atrium and ventricle  and insertion of a new set of atrial and ventricular pacing leads along with  pacemaker pocket revision in a patient status post permanent pacemaker  insertion secondary to tachybrady syndrome, who had migration of her atrial  and ventricular leads rendering them either nonfunctioning or poorly-  functioning.   INTRODUCTION:  The patient is a 77 year old woman who was admitted to the  hospital back in December with tachybrady syndrome and had long pauses and  underwent permanent pacemaker insertion.  She was subsequently found to have  atrial lead dislodgement, and her ventricular lead also was pulled back with  marked tension on the device.  The atrial lead would neither pace nor sense.  The ventricular lead would pace with very minimal sensing with R-waves of  2.5-3 mV.  In addition, the pacemaker generator had retracted caudally down  under the breast tissue.  She is now referred for pacemaker lead and  generator revision.   PROCEDURE:  After the usual preparation and draping, intravenous fentanyl  and midazolam were given for sedation.  A total of 30 mL of lidocaine was  infiltrated into the right infraclavicular region.  A 6 cm incision was  carried out over this region at a site just below the clavicle.  Electrocautery was utilized to dissect down to the old pacemaker pocket.  The previously-implanted Medtronic Kappa 900 device was removed and  disconnected from the atrial and ventricular pacing leads.   At this point  blunt dissection and electrocautery were utilized to dissect down along the  pacemaker leads, freeing up the dense fibrous adhesions and removing the  sewing sleeve from the fascial tissue.  At this point the atrial lead body  was counter-clocked and gently withdrawn out of the atrium into the superior  vena cava.  The right subclavian vein was then punctured and a J-wire  advanced into the vein.  At this point the ventricular lead was analyzed.  The R-waves on the old lead measured approximately 2.5-3 mV.  The pacing  threshold was stable.  Because there was marked tension on the lead, initial  attempts were carried out to advance the ventricular lead so that there was  generous lead redundancy and slack in the lead, as it was felt that the lead  was pending dislodgement on its own.  This was carried out, but R-waves  remained in the  2.5-3 mV range.  Because of the patient's relatively recent  implant and because of her ventricular undersensing, the ventricular lead  was also extracted and both leads were subsequently removed from the venous  system.  The patient's hemodynamics were stable during atrial and  ventricular lead extraction.  With this carried out satisfactorily, the  subclavian vein was again punctured and the Medronic model 5076 52-cm active-  fixation pacing lead, serial number [id number], was advanced into the right  ventricle.  The Medtronic model 5076 45-cm active-fixation pacing lead,  serial number [id number], was advanced into the right atrium.  Mapping was  then carried out in the right ventricle and at the final site at the RV apex  the R-waves measured greater than 30 mV and the pacing threshold was 0.4 V  at 0.5 msec.  Once the lead was actively fixed, the pacing impedance was 764  Ohms, and 10-volt pacing did not stimulate the diaphragm.  With the  ventricular lead in satisfactory position, attention was then turned to  placement of the atrial lead.   It was placed in the right atrial appendage,  where P-waves measured 5 mV and the atrial threshold was 0.4 V at 0.5 msec  with a pacing impedance of 509 Ohms.  Again 10-volt pacing did not stimulate  the diaphragm.  With both atrial and ventricular leads in satisfactory  position, they were secured to the subpectoralis fascia with a figure-of-  eight silk suture.  In addition, the sewing sleeve was secured with silk  suture.  At this point the previously-implanted Medtronic Kappa 900 dual-  chamber pacemaker was reconnected to the atrial and ventricular pacing leads  and placed in a new subcutaneous pocket much more cephalad from the prior  pocket.  Kanamycin irrigation was utilized to irrigate the incision and  electrocautery utilized to assure hemostasis.  At this point the incision  was closed with a layer of 2-0 Vicryl, followed by a layer of 3-0 Vicryl,  followed by a layer of 4-0 Vicryl.  Benzoin was painted on the skin and  Steri-Strips were applied and a pressure dressing placed, and the patient  returned to her room in satisfactory condition.   COMPLICATIONS:  There were no immediate procedural complications.   RESULTS:  This demonstrates successful atrial and ventricular lead  extraction, followed by insertion of new atrial and ventricular pacing leads  and pacemaker pocket revision in a patient with previously-implanted  pacemaker with lead dislodgement and generator migration.                                               Champ Mungo. Lovena Le, M.D.    GWT/MEDQ  D:  07/18/2003  T:  07/21/2003  Job:  [id number]   cc:   Blane Ohara McDiarmid, M.D.  Fax: [phone]   Belva Crome III, M.D.  [place] [employer]  [address]  [place] [address]  Fax: [phone]

## 2010-07-16 NOTE — Op Note (Signed)
[name], [name]                             ACCOUNT NO.:  [id number]   MEDICAL RECORD NO.:  [id number]                   PATIENT TYPE:  INP   LOCATION:  2012                                 FACILITY:  [facility]   PHYSICIAN:  Ludwig Lean. Doreatha Lew, M.D.            DATE OF BIRTH:  May 23, 1923   DATE OF PROCEDURE:  03/04/2003  DATE OF DISCHARGE:                                 OPERATIVE REPORT   PROCEDURE PERFORMED:  Implantation of a dual chamber pulse generator with  lead system under fluoroscopy.   CARDIOLOGIST:  Ludwig Lean. Doreatha Lew, M.D.   INDICATIONS FOR PROCEDURE:  Tachy-brady syndrome.   DESCRIPTION OF PROCEDURE:  The right subclavicular area was prepped and  draped.  There was a degree of obesity and the clavicles were somewhat  offset, and high and superior.  After the pocket was created a puncture was  made into the subclavian vein over the top of the first rib.  A 9 French  Cook introducer was used with a single puncture and guidewire was  introduced, and I subsequently used an [place] introducer to introduce  the ventricular lead.  With the retained guidewire and a figure-of-eight  suture the same puncture site was used for introduction of the atrial lead.   The ventricular lead was a Guidant 4470 active fixation lead, serial number  [id number].  As it was being introduced it was difficult to find a  satisfactory position and it appeared that there could be RV perforation.  The patient did develop mild pleuritic chest pain, which basically gradually  resolved throughout the procedure.  It is felt to probably be pericardial in  origin.  Hemodynamics remained stable.  Subsequently a satisfactory position  was found in the inferior RV apex.  The ventricular thresholds were 0.4  volts to capture at 1.1 mA current, 0.5 msec pulsewidth, impedance was 431  ohms, and R waves were 8.0 mV.   The atrial lead was a Guidant 4469 active fixation lead, serial number [id number]  [id number].   Atrial thresholds were 0.9 volts to capture at 1.9 mA current, 0.5  msec pulsewidth, impedance 534 ohms, and P waves were 2.0 mV.   Both leads were sutured in place and then the figure-of-eight suture was  tightened with adequate hemostasis.  The wound was flushed with kanamycin  solution on two or three different occasions both before and after  connection of the pulse generator.  The leads were connected to a Medtronic  DDD-R pulse generator [id number], serial number [id number].  The unit was  sutured in place.  The wound was then closed with 2-0 and subsequent 5-0  Dexon, and Steri-strips were applied.   The patient did tolerated the procedure well.  She received IV sedation with  3 mg of Versed during the procedure and 10 mg of Valium p.o. prior to the  procedure.  Ludwig Lean. Doreatha Lew, M.D.    SNT/MEDQ  D:  03/04/2003  T:  03/05/2003  Job:  [id number]   cc:   Blane Ohara McDiarmid, M.D.  [place] [place]  [place] [address]  Fax: [phone]   Belva Crome III, M.D.  [place]. [employer]  [address]  [place] [address]  Fax: [phone]

## 2010-07-16 NOTE — H&P (Signed)
[name], [name]                             ACCOUNT NO.:  [id number]   MEDICAL RECORD NO.:  [id number]                   PATIENT TYPE:  INP   LOCATION:  2034                                 FACILITY:  [facility]   PHYSICIAN:  Christy Sartorius, M.D.                   DATE OF BIRTH:  Jun 28, 1923   DATE OF ADMISSION:  07/21/2003  DATE OF DISCHARGE:                                HISTORY & PHYSICAL   CHIEF COMPLAINT:  My right arm was swollen when I woke up today.   HISTORY OF PRESENT ILLNESS:  Ms. Uccello is a 76 year old female with history  of tachy/brady syndrome.  She is status post DDD-R PTVDP placed March 04, 2003.  She had subsequent lead disconnect and was to present Jul 18, 2003,  for elective lead revision and repositioning of her generator. The elective  aspect was somewhat complicated by an early admission on the evening of Jul 17, 2003, with radiating chest pain. She ruled out for myocardial infarction  and had no further problems during her hospitalization; however, a left  heart catheterization was planned for approximately four to six weeks after  her eventual discharge.   Dr. Cristopher Peru revised her leads and successfully repositioned her  generator on Jul 18, 2003.  Her post procedure convalescence lasted 48 hours  at [facility].  She went home Sunday, Jul 20, 2003,  with antibiotic therapy.  This morning, Jul 21, 2003, she awoke to find her  right arm swollen.  There is no pain at the right upper extremity.  The  swelling, which was pronounced, per patient and daughter, has subsided on  examination at the emergency room on the evening of Jul 21, 2003.  She has a  strong right radial pulse.   ALLERGIES:  CODEINE.  She has no allergy to contrast dye.   MEDICATIONS:  1. Flovent 220 mcg per inhalation.  2. Coumadin which was stopped on Jul 11, 2003.  3. Digitek 0.125 mg daily.  4. Floredil Aerolizer 12 mcg per inhalation every 12 hours.  5.  Nitroglycerin 0.4 mg p.r.n.  6. Recently was prescribed with benzodiazepine for breathing distress but     has lost her bottle.   PAST MEDICAL HISTORY:  1. Admitted Jul 17, 2003, for chest pain which was thought to be atypical.     EKG was nondiagnostic.  Cardiac enzymes were negative for acute     myocardial infarction.  2. Status post dual chamber permanent pacemaker placed March 04, 2003, with     lead disconnect.  3. Tachy/brady syndrome.  4. History of recurrent atrial fibrillation/chronic Coumadin.  5. Bronchospastic lung disease.  6. History of renal cell carcinoma status post left nephrectomy.  7. GERD/hiatal hernia.  8. Osteoarthritis.  9. Status post cholecystectomy.  10.      Family history of coronary artery  disease in the father.  11.      Ongoing tobacco use, COPD.   SOCIAL HISTORY:  Patient lives in [place] with her daughter.  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 She does take  alcoholic beverages and will smoke with drinking, less than a pack per  month.  She is a widow.  Her husband was a former judge in [place], [place].   FAMILY HISTORY:  Mother died at age 58 of natural causes.  Father died at  age 75 of myocardial infarction.  She has one brother living at age 94.  He  is status post pacemaker but without history of coronary artery disease.   REVIEW OF SYMPTOMS:  The patient is not experiencing any fevers, chills,  weight loss or gain that is unexplained adenopathy.  HEENT:  The patient is  not having epistaxis, vertigo, hearing or vision loss.  INTEGUMENT:  The  patient has no ongoing rashes or lesions to her knowledge.  She does exhibit  some ecchymoses from treatment with  heparin and Coumadin.  CARDIOPULMONARY:  The patient has been having chest pains on and off, more pronounced on the  morning of her presentation of Jul 17, 2003.  She does have chronic dyspnea  on exertion.  She has three to four pillow  orthopnea but no paroxysmal  nocturnal dyspnea.  She denies any prior history of syncope or presyncope.  No claudication symptoms.  GENITOURINARY:  The patient does experience  nocturia occasionally.  She is not experiencing neuropsychiatric symptoms  such as depression or anxiety.  GI:  Some heartburn.  She does have a hiatal  hernia.  ENDOCRINE:  She has no history of diabetes and no history of  thyroid disease.   PHYSICAL EXAMINATION:  GENERAL APPEARANCE:  She is in no acute distress,  alert and oriented x3.  VITAL SIGNS:  Temperature 98.4, pulse 55 and regular, respiratory rate 20,  blood pressure 121/51, oxygen saturation 97% on room air.  LUNGS:  Clear to auscultation with just mild crackles at the bases.  CARDIOVASCULAR:  Regular rate and rhythm without murmur.  ABDOMEN:  Soft, nondistended, bowel sounds are present.  EXTREMITIES:  Right upper extremity is mildly swollen but nontender.  She  has a strong right radial pulse.  No edema except in the right upper  extremity with a possible minimal edema.  NEUROLOGIC:  No focal neurologic deficits.   IMPRESSION:  1. Presentation to the emergency room with right upper extremity swelling     three days after lead revision and repositioning of her DDD pacer     generator.  2. Recent admission Jul 17, 2003, for chest pain, ruled out for myocardial     infarction but scheduled for left heart catheterization in the future.  3. Tachy/brady syndrome.  4. Recurrent history of atrial fibrillation on chronic Coumadin.  5. Bronchospastic lung disease.  6. History of renal cell carcinoma status post left nephrectomy.  7. Gastroesophageal reflux disease/hiatal hernia.  8. Osteoarthritis.  9. Status post cholecystectomy.  33.      Family history of coronary artery disease in the father.  11.      Ongoing tobacco use chronic obstructive pulmonary disease.  PLAN:  The patient is to be admitted.  She will be started on IV heparin.  Her Coumadin  will also be started at low dose.  There was some thought of a  right upper extremity venogram but we  have decided to avoid that study and  just treat with heparin.  Dr. Lyndel Safe has spoken with Dr. [name], her  cardiologist, and it has been arranged that she will  have left heart catheterization on Wednesday, Jul 23, 2003, with possible  percutaneous coronary intervention and she will continue with Coumadin  loading at low doses.   Dr. Lyndel Safe has seen and examined the patient and formulated the plan.      Sueanne Margarita, P.A.                    Christy Sartorius, M.D.    GM/MEDQ  D:  07/21/2003  T:  07/22/2003  Job:  [id number]

## 2010-07-16 NOTE — Op Note (Signed)
NAMEArrow, [name]                   ACCOUNT NO.:  [id number]   MEDICAL RECORD NO.:  [id number]          PATIENT TYPE:  AMB   LOCATION:  ENDO                         FACILITY:  [facility]   PHYSICIAN:  Ronald Lobo, M.D.   DATE OF BIRTH:  07-17-1923   DATE OF PROCEDURE:  02/03/2004  DATE OF DISCHARGE:                                 OPERATIVE REPORT   PROCEDURE:  Colonoscopy with polypectomy and biopsy.   INDICATIONS:  A 77 year old female for followup of several colonic adenomas  removed three years ago.   FINDINGS:  Several diminutive polyps encountered and removed.   PROCEDURE:  The nature, purpose, and risks of the procedure were familiar to  the patient from prior examination.  She provided written consent.  Sedation  was fentanyl 75 mcg and Versed 6 mg IV, without arrhythmias or desaturation.  The Olympus adult video colonoscope was used for this procedure, but we  still encountered a fair amount of looping.  With the patient in the supine  position, I was able to reach the cecum as identified by clear visualization  of the appendiceal orifice, and pullback was then performed.   The quality of the prep was excellent, and it was felt that all areas were  well seen.   There was a 4 mm sessile polyp in the proximal colon, removed by cold snare  technique.  It is not clear if it is was successfully retrieved by  suctioning through the scope or if we just got some vegetable debris.  There  were also some small sessile polyps in the midcolon and sigmoid region (the  latter of which were hyperplastic in appearance, and the smaller ones which  essentially flatted out I did not bother to biopsy).  No large polyps,  cancer, colitis, or vascular malformations were observed, and I do not think  that there was any diverticulosis.  The patient tolerated the procedure  well,  and there were no apparent complications.   IMPRESSION:  Diminutive polyps, removed as described above.   PLAN:   Await pathology results.  Taking into account the patient's advanced  age and COPD, and considering the small size of the polyps removed today, it  is quite possible that routine surveillance follow-up exams will not be  clinically appropriate, but we will await the pathology results before  making a final decision.       RB/MEDQ  D:  02/03/2004  T:  02/03/2004  Job:  [id number]   cc:   Blane Ohara McDiarmid, M.D.  Fax: [phone]

## 2010-07-16 NOTE — Discharge Summary (Signed)
NAME:  [name], [name]                             ACCOUNT NO.:  [id number]   MEDICAL RECORD NO.:  [id number]                   PATIENT TYPE:  INP   LOCATION:  2024                                 FACILITY:  [facility]   PHYSICIAN:  Belva Crome, M.D.                DATE OF BIRTH:  June 11, 1923   DATE OF ADMISSION:  07/17/2003  DATE OF DISCHARGE:  07/20/2003                                 DISCHARGE SUMMARY   HISTORY OF PRESENT ILLNESS, REASON FOR ADMISSION:  Ms. [name] is a 78-year-  old Caucasian female admitted to [facility] for evaluation of chest pain, no  history of CAD.  Last hospitalized January 2005 with normal cardiac enzymes  and nonischemic adenosine Cardiolite in August 2004.  The patient had a  history of tachybrady syndrome and recurrent atrial fibrillation.  She had a  DDD pacemaker placed in January 2005 and was already scheduled to come into  the hospital for repositioning of leads by Dr. Lovena Le on Jul 18, 2003.  In  regard to the chest pain, she had one episode of chest pain at home at rest,  described as a heaviness or pulsation in her upper sternum and right neck  associated with mild dyspnea, resolved after 45 minutes.  She took three  nitroglycerin but is unclear whether they helped the patient's pain or not.  She does have a family history of her father dying at age 42 of an MI.  Mother died at age 30 of natural causes.   PHYSICAL EXAMINATION:  Initial vital signs were normal with a BP of 126/62,  pulse 57 and regular, respirations 24, and temperature mildly elevated at  99.  Physical exam was otherwise unremarkable.   EKG showed ectopic atrial rhythm as evidenced by an unusual T-axis.  The  rate was 59 beats per minute.  There were nonspecific ST-T wave changes.  Chest x-ray was pending at time of dictation of H&P.   Labs were as follows:  BUN 26, creatinine 1.2, potassium 4.1.  White count  13, 400, hemoglobin 10.9.  INR 1.0.  Initial cardiac markers were normal.   The patient was admitted with the following diagnoses:  1. Atypical chest pain, rule out myocardial infarction.  2. Tachybrady syndrome, history of atrial fibrillation.  3. Status post DDD pacemaker, for anticipated lead placement change on May     25.  4. History of left nephrectomy for renal cell carcinoma.  5. Asthma.   HOSPITAL COURSE:  CHEST PAIN:  The patient was admitted to the general telemetry unit, where  she was started on serial cardiac isoenzymes, heparin, Coumadin was placed  on hold one week prior due to anticipated pacemaker lead placement.  She was  also started on Nitrol paste and further evaluated for recurrence of chest  pain.  On Jul 18, 2003, Dr. [name] saw the patient at 8:30 in the morning.  She had not had any recurrence of chest pain, no dyspnea.  Enzymes were  negative.  No pericardial friction rub was noted on exam.  Subsequently Dr.  Lovena Le was contacted to be made aware that the patient was in the hospital  and would be appropriate for proceeding with pacemaker lead change-out.  This was done.  The RA and RV leads were extracted and subsequent new leads  were placed as follows:  The A lead is a Medtronic serial number [id number],  45 cm, and the Medtronic V was serial number [id number].  Impedance and  threshold testing were also done during the procedure.  The patient  tolerated the procedure well.  The following day she was having right  shoulder, arm, and chest discomfort.  Postprocedure chest x-ray was normal.  Dr. Thompson Caul recommendations were to discharge the patient on Jul 19, 2003,  to continue her Coumadin, and follow up with the Coumadin clinic next week,  and recommendations to perform an outpatient catheterization in three to  four weeks unless recurrent chest pain is significant enough to require the  patient to notify Dr. [name].  She is to follow up with Dr. Lovena Le in two  weeks and will follow in our pacer clinic outpatient.  He opted not to   continue topical nitrates as an outpatient.  For whatever reason, the  patient opted not to go home until Jul 20, 2003.  There were no changes late  in the plan of care, and the patient was discharged home on Jul 20, 2003.   FINAL DIAGNOSES:  1. Chest pain with normal EKG for her, no ischemia, and normal cardiac     isoenzymes.  2. History of tachybrady syndrome and atrial fibrillation.  3. History of DDD pacemaker with recent atrial and ventricular lead     replacement on Jul 18, 2003, by Dr. Lovena Le.  4. History of asthma.  5. History of left nephrectomy for renal cell carcinoma.   DISCHARGE MEDICATIONS:  1. Flovent inhaler as previous.  2. Digitek 0.125 mg daily.  3. Foradil Aerolizer one puff twice daily.  4. Nitroglycerin sublingual p.r.n. chest pain.  Instructions written on     discharge sheet.  5. Prevacid 30 mg daily.  6. Keflex 500 mg b.i.d., routine post-pacemaker lead change-out.  7. Coumadin, resume maintenance dose Monday, Jul 21, 2003.  8. Tylenol one to two tablets every four to six hours as needed for pain.   ACTIVITY:  Please see discharge sheet provided by [facility] for  pacemaker discharge instructions.  May shower on Friday, May 27.  Please  sponge-bathe until then.   DIET:  Low sodium, low cholesterol.   FOLLOW-UP APPOINTMENTS:  1. [facility] will call with appointment to check incision in two     weeks.  2. She is to follow up with the [facility] Coumadin clinic on Thursday, May 26.      [place] Lissa Merlin, N.P.                    Belva Crome, M.D.    ALE/MEDQ  D:  08/19/2003  T:  08/21/2003  Job:  [id number]   cc:   Champ Mungo. Lovena Le, M.D.

## 2010-07-16 NOTE — Discharge Summary (Signed)
[name], [name]                             ACCOUNT NO.:  [id number]   MEDICAL RECORD NO.:  [id number]                   PATIENT TYPE:  INP   LOCATION:  2034                                 FACILITY:  [facility]   PHYSICIAN:  Belva Crome, M.D.                DATE OF BIRTH:  1923-06-14   DATE OF ADMISSION:  07/21/2003  DATE OF DISCHARGE:  07/25/2003                                 DISCHARGE SUMMARY   ADMITTING DIAGNOSES:  1. Right upper extremity pain.  2. Status post dual-mode, dual-pacing, dual-sensing pacemaker insertion,     March 04, 2003.  3. Status post permanent pacemaker lead disconnection with revision and     repositioning of pacemaker generator, Jul 18, 2003.  4. PAF.   DISCHARGE DIAGNOSES:  1. Right upper extremity deep venous thrombosis.  2. Status post permanent pacemaker placement in January 2005 with normal     functioning.  3. PAF.  4. Systemic anticoagulation secondary to #1 and #2.  5. Mild right upper extremity cellulitis secondary to deep venous     thrombosis.   PROCEDURES:  None.   DISCHARGE STATUS:  Stable, improved.   HISTORY OF PRESENT ILLNESS:  Please see complete H&P for details, but, in  short, this is a 77 year old female with a history of tachybrady syndrome  and underwent permanent pacemaker placement with a DDDR pacemaker on March 04, 2003.  She had lead disconnection and had to have repositioning of her  generator with lead revision on Jul 18, 2003.  She was discharged home post  lead revision on antibiotic therapy without further complications.  She  presented to the hospital on Jul 21, 2003, with complaint of right arm pain  and swelling.   PHYSICAL EXAMINATION ON ADMISSION:  Please see complete H&P.  However, vital  signs were within normal limits.  She was afebrile.  O2 saturations were 97%  on room air.  Physical examination was essentially within normal limits with  the exception of mild right upper extremity swelling.  She had  +2 radial  pulse bilaterally in the upper extremities.  No other peripheral edema.   ADMISSION LABS:  Admission labs including CBC, BMP, PT and PTT were all  within normal limits with the exception of some mild anemia with a  hemoglobin of 11.2, hematocrit 33.5.   HOSPITAL COURSE:  She was admitted and started on IV heparin.  She was also  started on Coumadin which had actually been stopped Jul 11, 2003, with  pending PPM lead revision procedure.  There was some question as to whether  a cardiac catheterization needed to be performed.  She was actually  tentatively scheduled for this on Jul 23, 2003.  She had a previous  admission on Jul 17, 2003, for chest pain which she ruled out for MI.  An  elective outpatient cardiac catheterization was planned approximately four  to six weeks after that discharge, however, because of this new problem,  plans for catheterization were put in place.   On day two of admission, she had less edema and less pain to her right upper  extremity.  She was feeling better.  Vital signs remained stable.  She  remained afebrile.  Physical examination essentially unchanged.  Telemetry  showed a paced rhythm in the 70s.  INR on Jul 22, 2003, was still normal at  1.0.  She had no further physical complaints.  IV heparin continued with  Coumadin bridge.   Dr. [name] had a long discussion with Ms. Deese in regards to further  treatment plans.  At her insistence, cardiac catheterization will be delayed  and not performed at this time.  Anticoagulation continued.  It was  discussed with her, however, that if she became clinically symptomatic again  that cardiac catheterization will need to be performed.   INR on Jul 23, 2003, at 1.0.  No further complaints as to right upper  extremity pain or discomfort.  Vital signs remain stable.  IV heparin and  Coumadin continued.  There was no hematoma to the pacemaker generator  pocket.   By Jul 24, 2003, her INR had  increased to 1.7.  There were no other acute  changes in her condition and by Jul 25, 2003, her INR was now therapeutic at  2.4.  IV heparin was discontinued.  She was up and ambulating without  problems.  Vital signs remained stable.  She was discharged home without  incident.   DISCHARGE MEDICATIONS:  1. Coumadin 5 mg alternating with 2.5 mg every other day.  2. Digitek 0.125 mg daily.  3. Flovent MDI as taken previously.  4. Foradil inhaler as taken previously.  5. Keflex 500 mg b.i.d. x 3 days, then discontinue.   She is to continue activity restrictions secondary to pacemaker placement as  instructed previously.  She is not to do any heavy lifting or strenuous  activity in lifting, pulling, tugging, etc., with her right arm.  She is to  maintain a low-salt, low-fat diet.   She is to come to the Coumadin clinic at [facility] on Wednesday, June  1, at 9:45 a.m. for a repeat INR.   She is to call the office to get an appointment to be enrolled in our  pacemaker clinic.   She has an appointment to see Dr. [name] for followup on June 8 at 11:45 a.m.      Verlon Au, N.P.                        Belva Crome, M.D.    HB/MEDQ  D:  07/25/2003  T:  07/27/2003  Job:  [id number]

## 2010-08-03 ENCOUNTER — Other Ambulatory Visit (INDEPENDENT_AMBULATORY_CARE_PROVIDER_SITE_OTHER): Payer: Medicare Other | Admitting: Family Medicine

## 2010-08-03 DIAGNOSIS — M479 Spondylosis, unspecified: Secondary | ICD-10-CM

## 2010-08-03 DIAGNOSIS — M48061 Spinal stenosis, lumbar region without neurogenic claudication: Secondary | ICD-10-CM

## 2010-08-03 DIAGNOSIS — M1710 Unilateral primary osteoarthritis, unspecified knee: Secondary | ICD-10-CM

## 2010-08-03 MED ORDER — CYCLOBENZAPRINE HCL 5 MG PO TABS: 5.0000 mg | ORAL_TABLET | Freq: Three times a day (TID) | ORAL | Status: AC | PRN

## 2010-08-05 ENCOUNTER — Encounter: Payer: Self-pay | Admitting: Family Medicine

## 2010-08-05 ENCOUNTER — Ambulatory Visit (INDEPENDENT_AMBULATORY_CARE_PROVIDER_SITE_OTHER): Payer: Medicare Other | Admitting: Family Medicine

## 2010-08-05 VITALS — BP 145/85 | HR 97 | Temp 97.6°F | Wt 174.0 lb

## 2010-08-05 DIAGNOSIS — J449 Chronic obstructive pulmonary disease, unspecified: Secondary | ICD-10-CM

## 2010-08-05 DIAGNOSIS — I10 Essential (primary) hypertension: Secondary | ICD-10-CM

## 2010-08-05 DIAGNOSIS — D509 Iron deficiency anemia, unspecified: Secondary | ICD-10-CM

## 2010-08-05 DIAGNOSIS — D500 Iron deficiency anemia secondary to blood loss (chronic): Secondary | ICD-10-CM

## 2010-08-05 DIAGNOSIS — H3560 Retinal hemorrhage, unspecified eye: Secondary | ICD-10-CM

## 2010-08-05 DIAGNOSIS — H3562 Retinal hemorrhage, left eye: Secondary | ICD-10-CM | POA: Insufficient documentation

## 2010-08-05 HISTORY — DX: Iron deficiency anemia secondary to blood loss (chronic): D50.0

## 2010-08-05 HISTORY — DX: Iron deficiency anemia, unspecified: D50.9

## 2010-08-05 LAB — CBC
Hemoglobin: 12.4 g/dL (ref 12.0–15.0)
MCH: 28.8 pg (ref 26.0–34.0)
MCV: 91.2 fL (ref 78.0–100.0)
Platelets: 225 10*3/uL (ref 150–400)
RBC: 4.3 MIL/uL (ref 3.87–5.11)
WBC: 6.9 10*3/uL (ref 4.0–10.5)

## 2010-08-05 LAB — FERRITIN: Ferritin: 45 ng/mL (ref 10–291)

## 2010-08-05 MED ORDER — AZITHROMYCIN 250 MG PO TABS: ORAL_TABLET | ORAL | Status: AC

## 2010-08-05 NOTE — Progress Notes (Signed)
  Subjective:    Patient ID: [name], female    DOB: 1923-11-15, 77 y.o.   MRN: [id number]  HPI HYPERTENSION  Not taking BP at home.  No chest pain, No claudication. No new dyspnea.  Taking metoprolol 50 mg twice daily. No syncope, no dizziness, no wheezing  Diet: watching salt in diet. Taking gentle exercise at home.  Medications, past medical history, family history, social history were reviewed and updated.    Medications, past medical history,  family history, social history were reviewed and updated.  Review of Systems see HPI     Objective:   Physical Exam        Assessment & Plan:

## 2010-08-05 NOTE — Assessment & Plan Note (Signed)
Recent exacerbation of COPD with cough, increase in purulent sputum two weeks ago that improve after self-initiation of Z-Pak.  No complaints today.  Will refill her standing order for Z-Pak with 3 refills.

## 2010-08-05 NOTE — Assessment & Plan Note (Signed)
Assess Ferritin and Hgb today. Asymptomatic.  Plan to continue daily ferous sulfate until ferritin > 50.

## 2010-08-05 NOTE — Assessment & Plan Note (Signed)
Being followed by Dr Ellie Lunch Darrick Grinder).  Getting intraorbital injecttions month X3. Delayiing right cataract surgery until retinal hemorrhage is under control.

## 2010-08-05 NOTE — Assessment & Plan Note (Signed)
Adequate BP control. Tolerating medications.  No new end-organ damage.  Continue current medications.

## 2010-08-06 ENCOUNTER — Encounter: Payer: Self-pay | Admitting: Family Medicine

## 2010-08-23 DIAGNOSIS — R928 Other abnormal and inconclusive findings on diagnostic imaging of breast: Secondary | ICD-10-CM

## 2010-08-23 HISTORY — DX: Other abnormal and inconclusive findings on diagnostic imaging of breast: R92.8

## 2010-08-26 ENCOUNTER — Telehealth: Payer: Self-pay | Admitting: Family Medicine

## 2010-08-26 NOTE — Telephone Encounter (Signed)
Would like to speak to Dr. McDiarmid before 11:00.

## 2010-08-30 ENCOUNTER — Encounter: Payer: Self-pay | Admitting: Family Medicine

## 2010-09-07 ENCOUNTER — Encounter: Payer: Self-pay | Admitting: Family Medicine

## 2010-10-04 ENCOUNTER — Encounter: Payer: Self-pay | Admitting: Family Medicine

## 2010-10-04 DIAGNOSIS — H3530 Unspecified macular degeneration: Secondary | ICD-10-CM | POA: Insufficient documentation

## 2010-10-04 HISTORY — DX: Unspecified macular degeneration: H35.30

## 2010-10-11 ENCOUNTER — Encounter: Payer: Self-pay | Admitting: Family Medicine

## 2010-10-11 DIAGNOSIS — H11411 Vascular abnormalities of conjunctiva, right eye: Secondary | ICD-10-CM | POA: Insufficient documentation

## 2010-10-26 ENCOUNTER — Telehealth: Payer: Self-pay | Admitting: Family Medicine

## 2010-10-26 DIAGNOSIS — D508 Other iron deficiency anemias: Secondary | ICD-10-CM

## 2010-10-26 NOTE — Telephone Encounter (Signed)
Pt informed and appt scheduled Fle[place], Salome Spotted

## 2010-10-26 NOTE — Telephone Encounter (Signed)
[name] need to know if she should have her lab for iron level checked before coming for her appt on 9/13.  Please call her to let her know if she can do that earlier

## 2010-10-26 NOTE — Telephone Encounter (Signed)
Please ask  [name] to come into lab on 9/10 or 11/09/10 for CBC and ferritin level. Ask her to call at least a day ahead of time to [facility] to let the lab know when she will be coming.

## 2010-10-29 ENCOUNTER — Telehealth: Payer: Self-pay | Admitting: Family Medicine

## 2010-10-29 NOTE — Telephone Encounter (Signed)
Dr. Nelma Rothman wanted [name] to talk to Dr. McDiarmid about some labs.

## 2010-10-29 NOTE — Telephone Encounter (Signed)
Pt wanted to see if we received the notes from [facility].  Advised that we did and she will still need to come in for the labs on the [place], [place]

## 2010-11-02 ENCOUNTER — Encounter: Payer: Self-pay | Admitting: Family Medicine

## 2010-11-02 DIAGNOSIS — E559 Vitamin D deficiency, unspecified: Secondary | ICD-10-CM | POA: Insufficient documentation

## 2010-11-09 ENCOUNTER — Other Ambulatory Visit: Payer: Medicare Other

## 2010-11-09 DIAGNOSIS — D508 Other iron deficiency anemias: Secondary | ICD-10-CM

## 2010-11-09 LAB — CBC
Hemoglobin: 12.3 g/dL (ref 12.0–15.0)
MCH: 29.1 pg (ref 26.0–34.0)
Platelets: 263 10*3/uL (ref 150–400)
RBC: 4.22 MIL/uL (ref 3.87–5.11)
WBC: 5.8 10*3/uL (ref 4.0–10.5)

## 2010-11-09 LAB — FERRITIN: Ferritin: 67 ng/mL (ref 10–291)

## 2010-11-09 NOTE — Progress Notes (Signed)
CBC AND FERRITN DONE TODAY [name]

## 2010-11-10 ENCOUNTER — Encounter: Payer: Self-pay | Admitting: Family Medicine

## 2010-11-11 ENCOUNTER — Ambulatory Visit (INDEPENDENT_AMBULATORY_CARE_PROVIDER_SITE_OTHER): Payer: Medicare Other | Admitting: Family Medicine

## 2010-11-11 ENCOUNTER — Encounter: Payer: Self-pay | Admitting: Family Medicine

## 2010-11-11 VITALS — BP 147/75 | HR 92 | Temp 97.5°F | Wt 175.0 lb

## 2010-11-11 DIAGNOSIS — I480 Paroxysmal atrial fibrillation: Secondary | ICD-10-CM

## 2010-11-11 DIAGNOSIS — I10 Essential (primary) hypertension: Secondary | ICD-10-CM

## 2010-11-11 DIAGNOSIS — Z23 Encounter for immunization: Secondary | ICD-10-CM

## 2010-11-11 DIAGNOSIS — D508 Other iron deficiency anemias: Secondary | ICD-10-CM

## 2010-11-11 DIAGNOSIS — R928 Other abnormal and inconclusive findings on diagnostic imaging of breast: Secondary | ICD-10-CM

## 2010-11-11 DIAGNOSIS — R7309 Other abnormal glucose: Secondary | ICD-10-CM

## 2010-11-11 DIAGNOSIS — E559 Vitamin D deficiency, unspecified: Secondary | ICD-10-CM

## 2010-11-11 DIAGNOSIS — J449 Chronic obstructive pulmonary disease, unspecified: Secondary | ICD-10-CM

## 2010-11-11 DIAGNOSIS — M1710 Unilateral primary osteoarthritis, unspecified knee: Secondary | ICD-10-CM

## 2010-11-11 HISTORY — DX: Paroxysmal atrial fibrillation: I48.0

## 2010-11-11 MED ORDER — VITAMIN D (ERGOCALCIFEROL) 1.25 MG (50000 UNIT) PO CAPS: 50000.0000 [IU] | ORAL_CAPSULE | Freq: Once | ORAL | Status: DC

## 2010-11-11 MED ORDER — VITAMIN D 1000 UNITS PO CAPS: 1000.0000 [IU] | ORAL_CAPSULE | Freq: Every day | ORAL | Status: AC

## 2010-11-11 NOTE — Patient Instructions (Addendum)
Blood pressure looks good.    Start taking the ergocolcalciferol (super-Vitamin D) one tablet once a week for 8 weeks, then start taking regular cholecalciferol (regular vitamin D) once a day to treat your Vitamin D deficiency.  You can take a quarter of a regular Aspirin a day to keep your arteries open.   Put neosporin antibiotic ointment on area that was frozen if it starts to become red or painful.  Let Dr Makenzye Troutman know if you have problems with this site.

## 2010-11-12 ENCOUNTER — Encounter: Payer: Self-pay | Admitting: Family Medicine

## 2010-11-12 NOTE — Assessment & Plan Note (Signed)
Lab Results  Component Value Date   WBC 5.8 11/09/2010   HGB 12.3 11/09/2010   HCT 39.3 11/09/2010   MCV 93.1 11/09/2010   PLT 263 11/09/2010   Lab Results  Component Value Date   FERRITIN 67 11/09/2010   Hemoglobin within normal range. Ferritin above 50. Labs consistent with adequate repletion of patient's total body iron stores. Patient may stop daily ferrous sulfate. Will recheck hemoglobin and ferritin level in 3 months.

## 2010-11-12 NOTE — Assessment & Plan Note (Signed)
No recent exacerbations of COPD. Tolerating current daily LABD and inh corticosteroid pulmonary medications. Only occasional use of rescue medications.  Plan: Continue current pulmonary medication regiment

## 2010-11-12 NOTE — Assessment & Plan Note (Signed)
Lab Results  Component Value Date   HGBA1C 5.6 09/21/2009   Plan to check A1 C. on next office visit.

## 2010-11-12 NOTE — Assessment & Plan Note (Signed)
Reminded patient to have repeat mammogram in December 2012.

## 2010-11-12 NOTE — Assessment & Plan Note (Signed)
Plan: Ergocalciferol 50,000 units weekly for 8 weeks, then 1000 units daily of cholecalciferol.

## 2010-11-12 NOTE — Assessment & Plan Note (Signed)
Adequate blood pressure control.  No evidence of new end organ damage.  Tolerating medication without significant adverse effects.  Plan to continue current blood pressure regiment.   

## 2010-11-12 NOTE — Assessment & Plan Note (Signed)
Plan to check renal panel in January 2013

## 2010-11-12 NOTE — Progress Notes (Signed)
  Subjective:    Patient ID: [name], female    DOB: 1923-12-10, 77 y.o.   MRN: [id number]  HPI CHRONIC HYPERTENSION  Disease Monitoring  Blood pressure range: Not measuring at home  Chest pain: no   Dyspnea: no   Claudication: no   Medication compliance: yes  Medication Side Effects  Lightheadedness: no   Urinary frequency: no   Edema: no    Preventitive Healthcare:  Exercise: no  Recently seen Dr. Pernell Dupre (cardiology) who was pleased with her condition and plans to see her annually. She was recently seen by Dr. Casper Harrison ([place] pulmonology), who made no changes to her current pulmonary medication regiment. She is seen by her gynecologist with no new interventions planned. He continues to be followed by her ophthalmologist (a retinal specialist), and is receiving vascular growth factor inhibitor intraorbital injections.    COPD.  No recent use of her self initiated azithromycin or prednisone. Breathing is in well. No increased cough, sputum production. No increased shortness of breath. No decline in her baseline level of physical activity. She is remaining active with local politics. She has only used occasional albuterol. She takes her Advair and Spiriva as directed.  Iron deficiency anemia.  Continues to take daily, iron, fatigue, and shortness of breath. Had not returned. Occasional nausea that may be associated with the iron tablets. Occasional constipation.  Vitamin D deficiency.   Patient diagnosed with vitamin D deficiency by Dr. Cristina Gong. She was not started on any medications. Her diet does not include significant amount of vitamin D fortified foods. She gets out rarely in the sun.  Medications, past medical history,  family history, social history were reviewed and updated.  Review of Systems see history of present illness area     Objective:   Physical Exam  Constitutional: No distress.  Cardiovascular: Normal rate, regular rhythm and normal heart sounds.   Exam reveals no gallop.   No murmur heard. Pulmonary/Chest: Effort normal and breath sounds normal.  Musculoskeletal: She exhibits no edema.  Neurological:       Normal gait  Psychiatric: She has a normal mood and affect. Her behavior is normal.          Assessment & Plan:

## 2010-11-12 NOTE — Assessment & Plan Note (Signed)
Some pain, daily use of one tramadol daily for pain. Does not provide significant pain relief. Suggested patient try 2 tablets at a time as needed for pain.

## 2010-12-08 LAB — CK TOTAL AND CKMB (NOT AT ARMC)
CK, MB: 0.9
Total CK: 30

## 2010-12-08 LAB — COMPREHENSIVE METABOLIC PANEL
AST: 15
Albumin: 3.1 — ABNORMAL LOW
BUN: 22
Calcium: 8.6
Chloride: 108
Creatinine, Ser: 1.07
GFR calc Af Amer: 60 — ABNORMAL LOW
Total Bilirubin: 0.4
Total Protein: 5.6 — ABNORMAL LOW

## 2010-12-08 LAB — D-DIMER, QUANTITATIVE: D-Dimer, Quant: 2.99 — ABNORMAL HIGH

## 2010-12-08 LAB — POCT CARDIAC MARKERS
Myoglobin, poc: 70.4
Troponin i, poc: <0.05

## 2010-12-08 LAB — CBC
MCV: 87.5
RBC: 4.3
WBC: 8.2

## 2010-12-08 LAB — CARDIAC PANEL(CRET KIN+CKTOT+MB+TROPI)
CK, MB: 1
CK, MB: 1.1
Relative Index: INVALID
Relative Index: INVALID
Total CK: 31
Total CK: 34
Troponin I: 0.03

## 2010-12-08 LAB — TSH: TSH: 1.307

## 2010-12-08 LAB — LIPID PANEL: Cholesterol: 166

## 2010-12-08 LAB — BASIC METABOLIC PANEL
CO2: 28
Calcium: 8.7
GFR calc Af Amer: >60
Glucose, Bld: 116 — ABNORMAL HIGH
Potassium: 3.8
Sodium: 140

## 2010-12-08 LAB — I-STAT 8, (EC8 V) (CONVERTED LAB)
Acid-base deficit: 1
BUN: 25 — ABNORMAL HIGH
Bicarbonate: 24.5 — ABNORMAL HIGH
HCT: 39
Operator id: 285841
pCO2, Ven: 43.9 — ABNORMAL LOW
pH, Ven: 7.354 — ABNORMAL HIGH

## 2010-12-08 LAB — POCT I-STAT CREATININE: Creatinine, Ser: 1.1

## 2010-12-10 LAB — BASIC METABOLIC PANEL
BUN: 12
BUN: 12
BUN: 12
BUN: 13
BUN: 13
BUN: 15
CO2: 27
CO2: 28
CO2: 29
CO2: 29
CO2: 30
Calcium: 8.1 — ABNORMAL LOW
Calcium: 8.2 — ABNORMAL LOW
Calcium: 8.3 — ABNORMAL LOW
Calcium: 8.4
Chloride: 101
Chloride: 102
Chloride: 102
Chloride: 103
Chloride: 104
Chloride: 104
Creatinine, Ser: 0.98
Creatinine, Ser: 0.98
Creatinine, Ser: 1.04
Creatinine, Ser: 1.06
Creatinine, Ser: 1.11
GFR calc Af Amer: 57 — ABNORMAL LOW
GFR calc Af Amer: >60
GFR calc Af Amer: >60
GFR calc Af Amer: >60
GFR calc Af Amer: >60
GFR calc non Af Amer: 47 — ABNORMAL LOW
GFR calc non Af Amer: 50 — ABNORMAL LOW
GFR calc non Af Amer: 51 — ABNORMAL LOW
GFR calc non Af Amer: 55 — ABNORMAL LOW
Glucose, Bld: 115 — ABNORMAL HIGH
Glucose, Bld: 115 — ABNORMAL HIGH
Glucose, Bld: 119 — ABNORMAL HIGH
Glucose, Bld: 122 — ABNORMAL HIGH
Glucose, Bld: 129 — ABNORMAL HIGH
Potassium: 3.8
Potassium: 4.1
Potassium: 4.2
Potassium: 4.3
Potassium: 4.3
Sodium: 135
Sodium: 136
Sodium: 138
Sodium: 140

## 2010-12-10 LAB — PROTIME-INR
INR: 1
INR: 1
INR: 1.8 — ABNORMAL HIGH
INR: 2.4 — ABNORMAL HIGH
Prothrombin Time: 13.6
Prothrombin Time: 23.1 — ABNORMAL HIGH
Prothrombin Time: 23.8 — ABNORMAL HIGH

## 2010-12-10 LAB — TYPE AND SCREEN: Antibody Screen: NEGATIVE

## 2010-12-10 LAB — CBC
HCT: 27.6 — ABNORMAL LOW
HCT: 29.3 — ABNORMAL LOW
MCHC: 34.1
MCHC: 34.3
MCV: 88.3
MCV: 88.7
Platelets: 199
Platelets: 203
RBC: 3.32 — ABNORMAL LOW
RDW: 13.3
RDW: 13.3
WBC: 10.5

## 2010-12-13 LAB — COMPREHENSIVE METABOLIC PANEL
AST: 19
Albumin: 3.6
Alkaline Phosphatase: 78
Chloride: 110
GFR calc Af Amer: >60
Potassium: 4.7
Sodium: 145
Total Bilirubin: 1.2

## 2010-12-13 LAB — URINALYSIS, ROUTINE W REFLEX MICROSCOPIC
Hgb urine dipstick: NEGATIVE
Nitrite: NEGATIVE
Specific Gravity, Urine: 1.016
Urobilinogen, UA: 0.2

## 2010-12-13 LAB — CBC
Platelets: 243
WBC: 7.4

## 2010-12-13 LAB — URINE MICROSCOPIC-ADD ON

## 2010-12-16 ENCOUNTER — Telehealth: Payer: Self-pay | Admitting: Family Medicine

## 2010-12-16 DIAGNOSIS — M1990 Unspecified osteoarthritis, unspecified site: Secondary | ICD-10-CM

## 2010-12-16 MED ORDER — TRAMADOL HCL 50 MG PO TABS: 50.0000 mg | ORAL_TABLET | Freq: Four times a day (QID) | ORAL | Status: DC | PRN

## 2010-12-16 NOTE — Telephone Encounter (Signed)
Need refill on Tramadol.  Was sent request 2 wks ago.  Pharmacy received no response.  Need filled asap.  Send to [facility] on [address]

## 2010-12-30 ENCOUNTER — Other Ambulatory Visit: Payer: Self-pay | Admitting: Family Medicine

## 2010-12-30 MED ORDER — TIOTROPIUM BROMIDE MONOHYDRATE 18 MCG IN CAPS: 18.0000 ug | ORAL_CAPSULE | Freq: Every day | RESPIRATORY_TRACT | Status: DC

## 2011-01-03 ENCOUNTER — Encounter: Payer: Self-pay | Admitting: Family Medicine

## 2011-01-03 ENCOUNTER — Ambulatory Visit (INDEPENDENT_AMBULATORY_CARE_PROVIDER_SITE_OTHER): Payer: Medicare Other | Admitting: Family Medicine

## 2011-01-03 DIAGNOSIS — J449 Chronic obstructive pulmonary disease, unspecified: Secondary | ICD-10-CM

## 2011-01-03 DIAGNOSIS — Z905 Acquired absence of kidney: Secondary | ICD-10-CM

## 2011-01-03 DIAGNOSIS — D509 Iron deficiency anemia, unspecified: Secondary | ICD-10-CM

## 2011-01-03 DIAGNOSIS — I10 Essential (primary) hypertension: Secondary | ICD-10-CM

## 2011-01-03 DIAGNOSIS — D508 Other iron deficiency anemias: Secondary | ICD-10-CM

## 2011-01-03 LAB — CBC
Hemoglobin: 12.3 g/dL (ref 12.0–15.0)
MCH: 29.2 pg (ref 26.0–34.0)
MCHC: 32 g/dL (ref 30.0–36.0)
MCV: 91.2 fL (ref 78.0–100.0)
Platelets: 269 10*3/uL (ref 150–400)

## 2011-01-03 LAB — BASIC METABOLIC PANEL
BUN: 23 mg/dL (ref 6–23)
CO2: 28 mEq/L (ref 19–32)
Chloride: 102 mEq/L (ref 96–112)
Creat: 1.53 mg/dL — ABNORMAL HIGH (ref 0.50–1.10)

## 2011-01-03 LAB — FERRITIN: Ferritin: 39 ng/mL (ref 10–291)

## 2011-01-04 ENCOUNTER — Encounter: Payer: Self-pay | Admitting: Family Medicine

## 2011-01-04 NOTE — Assessment & Plan Note (Signed)
Recent exacerbation of COPD that has resolved.  Pt took Z-pak with the exacerbation. Tolerating the Spiriva and Advair. Transient increase rescue medication use now back at baseline level of BID.

## 2011-01-04 NOTE — Assessment & Plan Note (Signed)
Lab Results  Component Value Date   WBC 6.9 01/03/2011   HGB 12.3 01/03/2011   HCT 38.4 01/03/2011   MCV 91.2 01/03/2011   PLT 269 01/03/2011   Lab Results  Component Value Date   FERRITIN 39 01/03/2011   Stable Hgb.  Decrease in Ferritin.  Plan to continue off of iron supplement currently.  Will recheck CBC and Ferritin in 6 weeks OV.

## 2011-01-04 NOTE — Assessment & Plan Note (Signed)
Adequate blood pressure control.  No evidence of new end organ damage.  Tolerating medication without significant adverse effects.  Plan to continue current blood pressure regiment.   

## 2011-01-04 NOTE — Progress Notes (Signed)
  Subjective:    Patient ID: [name], female    DOB: 03-28-1923, 77 y.o.   MRN: [id number]  HPI CHRONIC HYPERTENSION  Disease Monitoring  Blood pressure range: not taking at home  Chest pain: no   Dyspnea: no   Claudication: no   Medication compliance: yes  Medication Side Effects  Lightheadedness: no   Urinary frequency: no   Edema: no   Preventitive Healthcare:  Exercise: no   Diet Pattern: watching sodium and fat Medications, past medical history,  family history, social history were reviewed and updated.  COPD Had URI symptoms last week that went into her chest.  Increased cough with increased sputum with DOE with climbing stairs.  Self-initiated Z-pak and has had improvement in symptoms this week.  Temporarily increased use of albuterol from usual twice a day to four times a day.  Back down now to twice a day.  CKD- Solitary Kidney  Pt to see Dr Alinda Money (Urol) for annual evaluation of s/p nephrectomy for Transitional cell Cancer.    Iron Deficiency Anemia Pt not taking iron for ~ two months now.  No chronic DOE.  Does have fatigue, but not above her baseline level.  Denies melena./hematochezia. No coca-cola colored urine.     Review of Systems See HPI     Objective:   Physical Exam        Assessment & Plan:

## 2011-01-04 NOTE — Assessment & Plan Note (Addendum)
Lab Results  Component Value Date   CREATININE 1.53* 01/03/2011   BUN 23 01/03/2011   NA 140 01/03/2011   K 4.1 01/03/2011   CL 102 01/03/2011   CO2 28 01/03/2011   Creatinine up from 1.1 in January 12 to 1.5 currently.  Not on any nephrotoxins. No cystitis symptoms.  Does have a slowed urinary emptying but feels she empties her bladder completely. Has not taken Lasix in several months.  Pt to see Dr Alinda Money ([facility]).  Will recheck renal function in 6 weeks. If increase in Creatinine persists, will get Urinalysis, FeNa and renal ultrasound.

## 2011-01-05 ENCOUNTER — Telehealth: Payer: Self-pay | Admitting: Family Medicine

## 2011-01-05 ENCOUNTER — Other Ambulatory Visit: Payer: Self-pay | Admitting: Family Medicine

## 2011-01-05 MED ORDER — ALBUTEROL SULFATE HFA 108 (90 BASE) MCG/ACT IN AERS: 2.0000 | INHALATION_SPRAY | Freq: Four times a day (QID) | RESPIRATORY_TRACT | Status: DC | PRN

## 2011-01-05 MED ORDER — AZITHROMYCIN 250 MG PO TABS: ORAL_TABLET | ORAL | Status: AC

## 2011-01-05 NOTE — Telephone Encounter (Signed)
I related results of blood tests, including slightly lower Ferritin with normal hgb and slightly higher serum creatinine.  Dr Meriam Sprague (Urol) performed a urinalysis yesterday that patient reports as unremarkable.   Plan to recheck Hgb, ferritin and serum Creatinine on next OV in 6 weeks.

## 2011-02-14 ENCOUNTER — Ambulatory Visit: Payer: Medicare Other | Admitting: Family Medicine

## 2011-02-15 ENCOUNTER — Encounter: Payer: Self-pay | Admitting: Family Medicine

## 2011-02-17 ENCOUNTER — Encounter: Payer: Self-pay | Admitting: Family Medicine

## 2011-02-17 ENCOUNTER — Ambulatory Visit (INDEPENDENT_AMBULATORY_CARE_PROVIDER_SITE_OTHER): Payer: Medicare Other | Admitting: Family Medicine

## 2011-02-17 DIAGNOSIS — N183 Chronic kidney disease, stage 3 unspecified: Secondary | ICD-10-CM

## 2011-02-17 DIAGNOSIS — D508 Other iron deficiency anemias: Secondary | ICD-10-CM

## 2011-02-17 DIAGNOSIS — Z905 Acquired absence of kidney: Secondary | ICD-10-CM

## 2011-02-17 DIAGNOSIS — I10 Essential (primary) hypertension: Secondary | ICD-10-CM

## 2011-02-17 LAB — CBC
HCT: 38.5 % (ref 36.0–46.0)
MCH: 29.2 pg (ref 26.0–34.0)
MCHC: 30.9 g/dL (ref 30.0–36.0)
RDW: 13.9 % (ref 11.5–15.5)

## 2011-02-17 LAB — BASIC METABOLIC PANEL
BUN: 17 mg/dL (ref 6–23)
Calcium: 8.9 mg/dL (ref 8.4–10.5)
Potassium: 4.4 mEq/L (ref 3.5–5.3)

## 2011-02-17 LAB — FERRITIN: Ferritin: 26 ng/mL (ref 10–291)

## 2011-02-17 NOTE — Patient Instructions (Signed)
Would recommend taking at least one TUMS extra-strength tablet a day to increase your calcium intake to keep your bone's strong.

## 2011-02-18 NOTE — Assessment & Plan Note (Signed)
While not at ideal BP control, requested patient to take her metoprolol prior to next OV to assess efficacy.  No symptoms of med intolerance. No symptoms or signs of new end organ damage.  Plan to continue current metoprolol dose and schedule. May increase dose on next visit if Pulse allows and BP requires. Pt developed acute gout flare on chronic HCTZ.

## 2011-02-18 NOTE — Progress Notes (Signed)
  Subjective:    Patient ID: [name], female    DOB: [birth year], 77 y.o.   MRN: [id number]  HPI  CHRONIC HYPERTENSION  Disease Monitoring  Blood pressure range: no measuring at home  Chest pain: no   Dyspnea: no   Claudication: no   Medication compliance: yes, but did not take this morning  Medication Side Effects  Lightheadedness: no   Urinary frequency: no   Edema: no     Preventitive Healthcare:  Exercise: no   Diet Pattern: Weight is up ten pounds from January of this year.  COPD  1.  Short of Breath at rest? No 2.  Short of breath doing physical activities? Able to perform ADLs and iADLs with acceptable sense of breathlessness 3.  Concerned about getting a cold or breathing getting worse? Has pulse dose of prednisone and Azithromycin should she experience an exacerbation 4. Depressed (down) because of your breathing problems?  No. 5.  Did you cough? No current cough 6. Did you produce phlegm?  No sputum  No difficutly using Spiriva or Advair.  Using albuterol about twice a day which helps her continue performing iADLs.   Anemia Not taking iron supplements currently. No melena or hematochezia.   Azotemia  Solitary kidney.  Not taking NSAIDS. Taking tramadol for OA pain.  No dysuria/frequency/difficulty voiding.    No smoking  Review of Systems See HPI.     Objective:   Physical Exam  Constitutional: She is cooperative. No distress.  HENT:  Right Ear: Tympanic membrane and ear canal normal.  Left Ear: Tympanic membrane and ear canal normal.  Neck: No JVD present. Carotid bruit is not present. No edema present. No thyromegaly present.  Cardiovascular: Normal rate, regular rhythm and normal heart sounds.   Pulmonary/Chest: Effort normal and breath sounds normal. No accessory muscle usage.  Abdominal: Soft. Bowel sounds are normal. There is no tenderness. There is no CVA tenderness.  Neurological: She is alert.          Assessment & Plan:

## 2011-02-18 NOTE — Assessment & Plan Note (Signed)
Lab Results  Component Value Date   WBC 6.3 02/17/2011   HGB 11.9* 02/17/2011   HCT 38.5 02/17/2011   MCV 94.6 02/17/2011   PLT 212 02/17/2011   Lab Results  Component Value Date   WBC 6.3 02/17/2011   HGB 11.9* 02/17/2011   HCT 38.5 02/17/2011   MCV 94.6 02/17/2011   PLT 212 02/17/2011   Lab Results  Component Value Date   FERRITIN 26 02/17/2011   Given decline in hgb and in ferritin level, will ask Mrs Okafor to restart her daily iron.  Suspect that Mrs Reliford has a very slow GI blood loss that necessitates daily iron to compensate for this very small daily negative iron balance. I asked Ms Jurkovich to restart Ferrous Sulfate 325 mg daily.  Will recheck Hgb and ferritin on next OV in 3 months.

## 2011-02-18 NOTE — Assessment & Plan Note (Signed)
Lab Results  Component Value Date   CREATININE 1.07 02/17/2011   BUN 17 02/17/2011   NA 144 02/17/2011   K 4.4 02/17/2011   CL 106 02/17/2011   CO2 28 02/17/2011   Improved serum creatinine.

## 2011-03-03 DIAGNOSIS — H353290 Exudative age-related macular degeneration, unspecified eye, stage unspecified: Secondary | ICD-10-CM | POA: Diagnosis not present

## 2011-03-03 DIAGNOSIS — H02059 Trichiasis without entropian unspecified eye, unspecified eyelid: Secondary | ICD-10-CM | POA: Diagnosis not present

## 2011-03-03 DIAGNOSIS — H353190 Nonexudative age-related macular degeneration, unspecified eye, stage unspecified: Secondary | ICD-10-CM | POA: Diagnosis not present

## 2011-03-08 DIAGNOSIS — J449 Chronic obstructive pulmonary disease, unspecified: Secondary | ICD-10-CM | POA: Diagnosis not present

## 2011-03-08 DIAGNOSIS — D649 Anemia, unspecified: Secondary | ICD-10-CM | POA: Diagnosis not present

## 2011-03-08 DIAGNOSIS — J438 Other emphysema: Secondary | ICD-10-CM | POA: Diagnosis not present

## 2011-03-08 DIAGNOSIS — R911 Solitary pulmonary nodule: Secondary | ICD-10-CM | POA: Diagnosis not present

## 2011-03-08 DIAGNOSIS — M1990 Unspecified osteoarthritis, unspecified site: Secondary | ICD-10-CM | POA: Diagnosis not present

## 2011-03-11 DIAGNOSIS — L723 Sebaceous cyst: Secondary | ICD-10-CM | POA: Diagnosis not present

## 2011-03-11 DIAGNOSIS — L821 Other seborrheic keratosis: Secondary | ICD-10-CM | POA: Diagnosis not present

## 2011-04-07 DIAGNOSIS — H353290 Exudative age-related macular degeneration, unspecified eye, stage unspecified: Secondary | ICD-10-CM | POA: Diagnosis not present

## 2011-04-11 ENCOUNTER — Other Ambulatory Visit: Payer: Self-pay | Admitting: Family Medicine

## 2011-04-11 NOTE — Telephone Encounter (Signed)
Patient is needing a refill on her Metoprolol sent to [facility] on [address].  Also, when the fax comes in, it gets sent back saying she isn't a patient here.

## 2011-04-11 NOTE — Telephone Encounter (Signed)
Forward to Dr McDiarmid

## 2011-04-12 MED ORDER — METOPROLOL TARTRATE 50 MG PO TABS: 25.0000 mg | ORAL_TABLET | Freq: Two times a day (BID) | ORAL | Status: DC

## 2011-04-18 DIAGNOSIS — I495 Sick sinus syndrome: Secondary | ICD-10-CM | POA: Diagnosis not present

## 2011-04-18 DIAGNOSIS — Z950 Presence of cardiac pacemaker: Secondary | ICD-10-CM | POA: Diagnosis not present

## 2011-05-02 ENCOUNTER — Ambulatory Visit (INDEPENDENT_AMBULATORY_CARE_PROVIDER_SITE_OTHER): Payer: Medicare Other | Admitting: Family Medicine

## 2011-05-02 ENCOUNTER — Ambulatory Visit
Admission: RE | Admit: 2011-05-02 | Discharge: 2011-05-02 | Disposition: A | Discharge status: Home / Self Care | Payer: Medicare Other | Source: Ambulatory Visit | Attending: Family Medicine | Admitting: Family Medicine

## 2011-05-02 ENCOUNTER — Encounter: Payer: Self-pay | Admitting: Family Medicine

## 2011-05-02 VITALS — BP 137/83 | HR 99 | Ht 64.5 in | Wt 180.0 lb

## 2011-05-02 DIAGNOSIS — M7730 Calcaneal spur, unspecified foot: Secondary | ICD-10-CM | POA: Diagnosis not present

## 2011-05-02 DIAGNOSIS — D508 Other iron deficiency anemias: Secondary | ICD-10-CM

## 2011-05-02 DIAGNOSIS — S99929A Unspecified injury of unspecified foot, initial encounter: Secondary | ICD-10-CM

## 2011-05-02 DIAGNOSIS — M79609 Pain in unspecified limb: Secondary | ICD-10-CM | POA: Diagnosis not present

## 2011-05-02 DIAGNOSIS — S91309A Unspecified open wound, unspecified foot, initial encounter: Secondary | ICD-10-CM

## 2011-05-02 DIAGNOSIS — I10 Essential (primary) hypertension: Secondary | ICD-10-CM

## 2011-05-02 DIAGNOSIS — D509 Iron deficiency anemia, unspecified: Secondary | ICD-10-CM | POA: Diagnosis not present

## 2011-05-02 DIAGNOSIS — M79672 Pain in left foot: Secondary | ICD-10-CM

## 2011-05-02 DIAGNOSIS — T148XXA Other injury of unspecified body region, initial encounter: Secondary | ICD-10-CM | POA: Diagnosis not present

## 2011-05-02 MED ORDER — METOPROLOL SUCCINATE ER 50 MG PO TB24: 50.0000 mg | ORAL_TABLET | Freq: Every day | ORAL | Status: DC

## 2011-05-02 NOTE — Patient Instructions (Signed)
Dr Else Habermann will let you know about the result of your X-ray.

## 2011-05-03 ENCOUNTER — Encounter: Payer: Self-pay | Admitting: Family Medicine

## 2011-05-03 DIAGNOSIS — S99929A Unspecified injury of unspecified foot, initial encounter: Secondary | ICD-10-CM

## 2011-05-03 HISTORY — DX: Unspecified injury of unspecified foot, initial encounter: S99.929A

## 2011-05-03 LAB — CBC
HCT: 38.8 % (ref 36.0–46.0)
Platelets: 282 10*3/uL (ref 150–400)
RDW: 13.6 % (ref 11.5–15.5)
WBC: 6.7 10*3/uL (ref 4.0–10.5)

## 2011-05-03 LAB — FERRITIN: Ferritin: 40 ng/mL (ref 10–291)

## 2011-05-03 NOTE — Progress Notes (Signed)
  Subjective:    Patient ID: [name], female    DOB: [birth year], 78 y.o.   MRN: [id number]  HPI Left foot bruising  Onset: Two weeks ago after "something" fell on left foot.  Patient cannot recall what feel on her foot Location: left foot dorsum, toward toes Quality: faint ache Severity: minimal Function: no impact on walking or standing.  Duration: 2 weeks Course: decreasing discomfort Radiation: none Relief: not tried anything for relief Associated Symptoms: none. No edema of foot or leg. No fever. No limp. Prior Diagnostic Testing or Treatments: none Relevant PMH/PSH: Osteoarthrtitis of legs, On aspirin daily.  Iron-deficiency Anemia  Patient taking over-the-counter iron tablets once daily. No GI intolerance. No fatigue or shortness of breath. No melena or hematochezia.  Social history: No smoking.  Review of Systems See HPI     Objective:   Physical Exam  Vital signs noted.  General: No apparent distress, ambulating, non-atalgic gait. No pallor.  Left foot: Ecchymosis dorsum left foot darkest over second and third MTP. Tender to dorsum palpation second MTP head. No tenderness along remaining rays. Intact. Lateral and medial ankle ligaments. Normal anterior drawer sign. No significant edema. No tenderness on base fifth metatarsal. No tenderness Navicular region. No tenderness Distal posterior tibia or fibula.  Left leg: Equal calf circumferences bilaterally. Negative Homans. No palpable cords in the popliteal fossa. No edema.  X-ray left foot: No acute fractures.       Assessment & Plan:

## 2011-05-03 NOTE — Assessment & Plan Note (Signed)
No fracture on foot. X-ray. Conservative management at this time. To contact [facility] should foot condition worsen.

## 2011-05-03 NOTE — Assessment & Plan Note (Signed)
Monitoring hemoglobin  Lab Results  Component Value Date   HGB 11.7* 05/02/2011   Lab Results  Component Value Date   FERRITIN 40 05/02/2011   Adequate iron stores. Continue daily, ferrous sulfate one tablet for possible chronic low grade GI blood loss. Patient tolerating iron without difficulty. Recheck ferritin and hemoglobin in 3 months.

## 2011-05-09 DIAGNOSIS — D500 Iron deficiency anemia secondary to blood loss (chronic): Secondary | ICD-10-CM | POA: Diagnosis not present

## 2011-05-09 DIAGNOSIS — E559 Vitamin D deficiency, unspecified: Secondary | ICD-10-CM | POA: Diagnosis not present

## 2011-05-09 DIAGNOSIS — H1130 Conjunctival hemorrhage, unspecified eye: Secondary | ICD-10-CM | POA: Diagnosis not present

## 2011-05-10 ENCOUNTER — Other Ambulatory Visit: Payer: Self-pay | Admitting: Family Medicine

## 2011-05-10 MED ORDER — OMEPRAZOLE 40 MG PO CPDR: 40.0000 mg | DELAYED_RELEASE_CAPSULE | Freq: Every day | ORAL | Status: DC

## 2011-05-17 DIAGNOSIS — H353290 Exudative age-related macular degeneration, unspecified eye, stage unspecified: Secondary | ICD-10-CM | POA: Diagnosis not present

## 2011-06-06 ENCOUNTER — Ambulatory Visit (INDEPENDENT_AMBULATORY_CARE_PROVIDER_SITE_OTHER): Payer: Medicare Other | Admitting: Family Medicine

## 2011-06-06 ENCOUNTER — Encounter: Payer: Self-pay | Admitting: Family Medicine

## 2011-06-06 VITALS — BP 115/71 | HR 88 | Temp 98.4°F | Ht 64.5 in | Wt 183.0 lb

## 2011-06-06 DIAGNOSIS — D509 Iron deficiency anemia, unspecified: Secondary | ICD-10-CM

## 2011-06-06 DIAGNOSIS — D508 Other iron deficiency anemias: Secondary | ICD-10-CM

## 2011-06-06 DIAGNOSIS — Z905 Acquired absence of kidney: Secondary | ICD-10-CM | POA: Diagnosis not present

## 2011-06-06 DIAGNOSIS — I4891 Unspecified atrial fibrillation: Secondary | ICD-10-CM | POA: Diagnosis not present

## 2011-06-06 DIAGNOSIS — I10 Essential (primary) hypertension: Secondary | ICD-10-CM | POA: Diagnosis not present

## 2011-06-06 DIAGNOSIS — R928 Other abnormal and inconclusive findings on diagnostic imaging of breast: Secondary | ICD-10-CM

## 2011-06-06 DIAGNOSIS — R7309 Other abnormal glucose: Secondary | ICD-10-CM

## 2011-06-06 DIAGNOSIS — E785 Hyperlipidemia, unspecified: Secondary | ICD-10-CM

## 2011-06-06 DIAGNOSIS — N183 Chronic kidney disease, stage 3 unspecified: Secondary | ICD-10-CM

## 2011-06-06 DIAGNOSIS — I480 Paroxysmal atrial fibrillation: Secondary | ICD-10-CM

## 2011-06-06 LAB — POCT GLYCOSYLATED HEMOGLOBIN (HGB A1C): Hemoglobin A1C: 5.6

## 2011-06-06 NOTE — Patient Instructions (Signed)
Your weight today was 183 pounds which is up three pounds from March 2013. Your Blood Pressure was 115/71.  Great Job!  If the numbness in your right hand ring and middle finger worsens, let Dr Falana Clagg know.  If the burning in your back worsens, let Dr Deontrae Drinkard know.

## 2011-06-07 ENCOUNTER — Encounter: Payer: Self-pay | Admitting: Family Medicine

## 2011-06-07 LAB — CBC
MCHC: 30.8 g/dL (ref 30.0–36.0)
Platelets: 213 10*3/uL (ref 150–400)
RDW: 13.1 % (ref 11.5–15.5)
WBC: 6.3 10*3/uL (ref 4.0–10.5)

## 2011-06-07 LAB — FERRITIN: Ferritin: 35 ng/mL (ref 10–291)

## 2011-06-07 LAB — LDL CHOLESTEROL, DIRECT: Direct LDL: 101 mg/dL — ABNORMAL HIGH

## 2011-06-07 LAB — BASIC METABOLIC PANEL
Calcium: 9 mg/dL (ref 8.4–10.5)
Potassium: 4.1 mEq/L (ref 3.5–5.3)
Sodium: 142 mEq/L (ref 135–145)

## 2011-06-07 NOTE — Assessment & Plan Note (Signed)
Remind patient about follow up mammography on next OFFICE VISIT in 6 weeks.

## 2011-06-07 NOTE — Assessment & Plan Note (Addendum)
Adequate blood pressure control.  No evidence of new end organ damage.  Tolerating medication without significant adverse effects.  Plan to continue current blood pressure regiment. RTC 6 weeks.

## 2011-06-07 NOTE — Assessment & Plan Note (Signed)
Lab Results  Component Value Date   HGB 10.9* 06/06/2011   Lab Results  Component Value Date   HGB 10.9* 06/06/2011   Lab Results  Component Value Date   FERRITIN 35 06/06/2011  Adequate Hgb, Slight decrease of 0.6 g/dL from last month.  Will recheck Hgb and ferritin next visit in 6 weeks. May need to increase iron supplementation from more than one tablet ferrous sulfate a day.

## 2011-06-07 NOTE — Progress Notes (Signed)
  Subjective:    Patient ID: [name], female    DOB: [birth year], 78 y.o.   MRN: [id number]  HPI  Iron-deficiency Anemia  Patient taking over-the-counter iron tablets once daily. No GI intolerance.  No fatigue or shortness of breath. No melena or hematochezia.  CHRONIC HYPERTENSION  Disease Monitoring  Blood pressure range: not checking at home  Chest pain: no   Dyspnea: no, only with climbing stairs which is not new   Claudication: no              Palpitations: No  Medication compliance: yes. No use of NTG SL. Medication Side Effects  Lightheadedness: no   Urinary frequency: no   Edema: no    Preventitive Healthcare:  Exercise: no, but active outside of home   Diet Pattern: eating more carbohydrates lately  Salt Restriction: no shaken salt.  Avoids salty foods.   COPD Breathing well. No cough No increased sputum Taking Spiriva and Advair without problems.  Rarely using albuterol rescure. No smoking.  Avoids being around those with colds.  Does have a Grandson who is toddler. Planning trip to [place] this June.    Review of Systems See HPI     Objective:   Physical Exam  Constitutional: No distress.       Well-groomed  Cardiovascular: Normal rate, regular rhythm and normal heart sounds.  Exam reveals no gallop.   No murmur heard. Pulmonary/Chest: Effort normal and breath sounds normal. No respiratory distress. She has no wheezes.  Musculoskeletal: She exhibits no edema.  Neurological: She is alert.  Psychiatric: She has a normal mood and affect. Her behavior is normal. Thought content normal.          Assessment & Plan:

## 2011-06-07 NOTE — Assessment & Plan Note (Signed)
Currently in regular rhythm and regular rate.

## 2011-06-09 ENCOUNTER — Other Ambulatory Visit: Payer: Self-pay | Admitting: Family Medicine

## 2011-06-09 DIAGNOSIS — D509 Iron deficiency anemia, unspecified: Secondary | ICD-10-CM

## 2011-06-09 DIAGNOSIS — D508 Other iron deficiency anemias: Secondary | ICD-10-CM

## 2011-06-09 DIAGNOSIS — M48061 Spinal stenosis, lumbar region without neurogenic claudication: Secondary | ICD-10-CM

## 2011-06-09 DIAGNOSIS — M1710 Unilateral primary osteoarthritis, unspecified knee: Secondary | ICD-10-CM

## 2011-06-09 DIAGNOSIS — J449 Chronic obstructive pulmonary disease, unspecified: Secondary | ICD-10-CM

## 2011-06-09 DIAGNOSIS — M479 Spondylosis, unspecified: Secondary | ICD-10-CM

## 2011-06-09 MED ORDER — WALKER MISC: 1.0000 | Freq: Every day | Status: DC

## 2011-06-09 NOTE — Assessment & Plan Note (Signed)
Lab Results  Component Value Date   HGB 10.9* 06/06/2011   Lab Results  Component Value Date   FERRITIN 35 06/06/2011  Recommended patient increase ferrous sulfate to one tab twice daily. Recheck Hgb and ferritin on next OFFICE VISIT in May.

## 2011-06-14 ENCOUNTER — Other Ambulatory Visit: Payer: Self-pay | Admitting: Family Medicine

## 2011-06-14 MED ORDER — FUROSEMIDE 40 MG PO TABS: 40.0000 mg | ORAL_TABLET | Freq: Every day | ORAL | Status: DC

## 2011-06-23 DIAGNOSIS — H353190 Nonexudative age-related macular degeneration, unspecified eye, stage unspecified: Secondary | ICD-10-CM | POA: Diagnosis not present

## 2011-06-23 DIAGNOSIS — H353290 Exudative age-related macular degeneration, unspecified eye, stage unspecified: Secondary | ICD-10-CM | POA: Diagnosis not present

## 2011-06-23 DIAGNOSIS — H2510 Age-related nuclear cataract, unspecified eye: Secondary | ICD-10-CM | POA: Diagnosis not present

## 2011-06-24 ENCOUNTER — Other Ambulatory Visit: Payer: Self-pay | Admitting: Family Medicine

## 2011-06-24 MED ORDER — FUROSEMIDE 40 MG PO TABS: 40.0000 mg | ORAL_TABLET | Freq: Every day | ORAL | Status: DC | PRN

## 2011-06-24 MED ORDER — METOPROLOL SUCCINATE ER 50 MG PO TB24: 50.0000 mg | ORAL_TABLET | Freq: Every day | ORAL | Status: DC

## 2011-06-24 MED ORDER — OMEPRAZOLE 40 MG PO CPDR: 40.0000 mg | DELAYED_RELEASE_CAPSULE | Freq: Every day | ORAL | Status: DC

## 2011-06-24 MED ORDER — SIMVASTATIN 40 MG PO TABS: 40.0000 mg | ORAL_TABLET | Freq: Every day | ORAL | Status: DC

## 2011-07-05 ENCOUNTER — Ambulatory Visit (INDEPENDENT_AMBULATORY_CARE_PROVIDER_SITE_OTHER): Payer: Medicare Other | Admitting: Family Medicine

## 2011-07-05 VITALS — BP 115/75 | HR 87 | Temp 98.0°F | Ht 64.5 in | Wt 179.0 lb

## 2011-07-05 DIAGNOSIS — M542 Cervicalgia: Secondary | ICD-10-CM | POA: Diagnosis not present

## 2011-07-05 DIAGNOSIS — S139XXA Sprain of joints and ligaments of unspecified parts of neck, initial encounter: Secondary | ICD-10-CM

## 2011-07-05 DIAGNOSIS — X58XXXA Exposure to other specified factors, initial encounter: Secondary | ICD-10-CM | POA: Diagnosis not present

## 2011-07-05 DIAGNOSIS — S161XXA Strain of muscle, fascia and tendon at neck level, initial encounter: Secondary | ICD-10-CM

## 2011-07-05 DIAGNOSIS — M9981 Other biomechanical lesions of cervical region: Secondary | ICD-10-CM | POA: Diagnosis not present

## 2011-07-05 NOTE — Progress Notes (Signed)
  Subjective:    Patient ID: [name], female    DOB: [birth year], 78 y.o.   MRN: [id number]  HPI Neck pain, acute Onset: 06/30/11, awoke in morning with pain in back of neckneck Location: posterior neck Quality: ache constantly, sharp pain with neck rotation Severity: moderate ache with severe sharp pain with neck rotation Function: interfereing with ease of performing ADLs and iADLs Duration: 5 days Pattern: constant with intermittent worsening Course: stable Radiation: no radiation into arms. No weakness in arms. No current paresthetias into arms/hands.  Relief: mild to moderate releif with tramadol 50 mg with Flexeril 5 mg oral. Helps particularly with sleeping.  Precipitant: no known precipitant. Awoke from sleep with pain.  Associated Symptoms: No urinary incontinence or retention, No fecal incontinence.  Trauma (Acute or Chronic): none recalled.  Prior Diagnostic Testing or Treatments: No history of neck pain or injury.  Relevant PMH/PSH: History of lumbar disc disease, lumbar stenosis, osteoarthritis of spine and lower legs, spondylolithesis of lumbar region     Review of Systems See HPI     Objective:   Physical Exam  Constitutional: She appears distressed (Mild distress).  Neck:    Neurological: She is alert. She has normal strength. No cranial nerve deficit or sensory deficit. She displays no Babinski's sign on the right side. She displays no Babinski's sign on the left side.  Reflex Scores:      Tricep reflexes are 1+ on the right side and 1+ on the left side.      Bicep reflexes are 1+ on the right side and 1+ on the left side.      Brachioradialis reflexes are 1+ on the right side and 1+ on the left side.      Patellar reflexes are 1+ on the right side and 1+ on the left side.      Achilles reflexes are 1+ on the right side and 1+ on the left side.         Assessment & Plan:

## 2011-07-05 NOTE — Patient Instructions (Signed)
I believe you have an acute cervical muscle strain.  Dr Estill Batten (Chiropracter) is waiting to see you to see if he can help with the muscle spasm in your neck and back. You can continue to use your Flexeril and Tramadol as needed for neck pain and stiffness.  Located across from the [facility] in the [place]. Designer, multimedia [phone])

## 2011-07-05 NOTE — Assessment & Plan Note (Addendum)
Nontraumatic cervical muscle strain. No evidence of acute radiculopathy or myelopathy on exam today. Some acute pain control with Tramadol 50 mg every 6 hours and Flexeril 5 mg every 6 hours, but still with significant muscle spasm in posterior paracervical spine muscles. Referral to Dr Estill Batten [facility]) for deep tissue release modalities. Dr Bobby Rumpf kindly agreed to see the patient today. Patient may continue on tramadol and flexeril.  She knows not to drive while taking these medications and watch for her chronic constipation worsening.  She takes MOM as needed for constipation with successful responses in past.

## 2011-07-07 DIAGNOSIS — M542 Cervicalgia: Secondary | ICD-10-CM | POA: Diagnosis not present

## 2011-07-07 DIAGNOSIS — M9981 Other biomechanical lesions of cervical region: Secondary | ICD-10-CM | POA: Diagnosis not present

## 2011-07-21 ENCOUNTER — Encounter: Payer: Self-pay | Admitting: Family Medicine

## 2011-07-21 ENCOUNTER — Ambulatory Visit (INDEPENDENT_AMBULATORY_CARE_PROVIDER_SITE_OTHER): Payer: Medicare Other | Admitting: Family Medicine

## 2011-07-21 VITALS — BP 132/73 | HR 88 | Temp 98.4°F | Ht 64.5 in | Wt 177.0 lb

## 2011-07-21 DIAGNOSIS — R200 Anesthesia of skin: Secondary | ICD-10-CM

## 2011-07-21 DIAGNOSIS — E538 Deficiency of other specified B group vitamins: Secondary | ICD-10-CM | POA: Diagnosis not present

## 2011-07-21 DIAGNOSIS — D509 Iron deficiency anemia, unspecified: Secondary | ICD-10-CM | POA: Diagnosis not present

## 2011-07-21 DIAGNOSIS — Z79899 Other long term (current) drug therapy: Secondary | ICD-10-CM

## 2011-07-21 DIAGNOSIS — R209 Unspecified disturbances of skin sensation: Secondary | ICD-10-CM

## 2011-07-21 DIAGNOSIS — I10 Essential (primary) hypertension: Secondary | ICD-10-CM | POA: Diagnosis not present

## 2011-07-21 DIAGNOSIS — Z905 Acquired absence of kidney: Secondary | ICD-10-CM

## 2011-07-21 HISTORY — DX: Anesthesia of skin: R20.0

## 2011-07-21 NOTE — Patient Instructions (Signed)
I believe your finger tingling is a form of peripheral neuropathy.  If the blood work does not show a cause, then I suspect that it is "idiopathic" neuropathy, which does not tend to worsen but remains annoying.   Aryahi Denzler cell phone number is [phone].   Schedule appointment to see Clytee Heinrich in September.

## 2011-07-22 ENCOUNTER — Encounter: Payer: Self-pay | Admitting: Family Medicine

## 2011-07-22 LAB — CBC
HCT: 28.9 % — ABNORMAL LOW (ref 36.0–46.0)
MCHC: 31.1 g/dL (ref 30.0–36.0)
MCV: 93.2 fL (ref 78.0–100.0)
Platelets: 303 10*3/uL (ref 150–400)
RDW: 14 % (ref 11.5–15.5)
WBC: 4.1 10*3/uL (ref 4.0–10.5)

## 2011-07-22 LAB — COMPREHENSIVE METABOLIC PANEL
ALT: 8 U/L (ref 0–35)
AST: 17 U/L (ref 0–37)
Alkaline Phosphatase: 72 U/L (ref 39–117)
BUN: 26 mg/dL — ABNORMAL HIGH (ref 6–23)
Calcium: 9.1 mg/dL (ref 8.4–10.5)
Chloride: 107 mEq/L (ref 96–112)
Creat: 1.37 mg/dL — ABNORMAL HIGH (ref 0.50–1.10)

## 2011-07-22 LAB — VITAMIN B12: Vitamin B-12: 877 pg/mL (ref 211–911)

## 2011-07-22 LAB — SEDIMENTATION RATE: Sed Rate: 27 mm/hr — ABNORMAL HIGH (ref 0–22)

## 2011-07-22 LAB — TSH: TSH: 1.62 u[IU]/mL (ref 0.350–4.500)

## 2011-07-22 NOTE — Assessment & Plan Note (Signed)
Given symptoms of involvement of distal ends of 2nd and 3rd finder tips bilaterally I suspect this is mild Carpel Tunnel Syndrome. The lack of (+) Phalen and (+) Tinel failed to give a confirmation.  Given recent neck pain, I suppose the myelopathy or radiculopathy is possible, but there are no associated symptoms or signs for these findings.  Currently she is without neck pain after Chiropractic interventions last week.   Will check for evidence of metabolic and nutritional and inflammatory  Causes of her symptoms.  If unrevealing labs, then will monitor for progression of merely annoying symptoms currently to more discomforting or disabling ones requiring more confirmatory testing, e.g. EMG/NCS, and interventions.

## 2011-07-22 NOTE — Assessment & Plan Note (Addendum)
Lab Results  Component Value Date   HGB 9.0* 07/21/2011   Lab Results  Component Value Date   FERRITIN 28 07/21/2011   Continued decline in patient's hemoglobin and ferritin over last 5 months.  Patient currently taking Ferrous Sulfate 325 mg tab, one tab twice a day.  Recommended that patient increase to one tab three times a day. Asked patient to come in to [facility] lab in one month for recheck Hemoglobin, retic count, IgA Endomyseal antibody test for celiac disease.

## 2011-07-22 NOTE — Progress Notes (Signed)
  Subjective:    Patient ID: [name], female    DOB: 1924/02/01, 78 y.o.   MRN: [id number]  HPI  Numbness in Fingers Onset: over last several months Pattern: intermittent, no relation to time or day or activity. Duration: episodes last 2 to 3 minutes at a time.  Course: Stable Location: distal ends of 2nd and 3rd digits bilaterally.  Symptoms can occur either unilaterally or bilaterally. Pain: None in neck today, though did have neck pain last weak. Weakness: none. No bowel or bladder incontinence.  Associated symptoms: No change in color of fingers with episodes of numbness Relief: runs her hands under water.   Iron deficiency Anemia No melena or hematochezia No shortness of breath, chest pain, near-syncope. Taking iron sulfate 325 mg twice a day since early April 13. Managing constipation with MOM. No nausea or vomiting   ROS: SEE HPI.  Medications were reviewed.    Review of Systems     Objective:   Physical Exam  Constitutional: No distress.  Cardiovascular: Normal rate, regular rhythm and normal heart sounds.   Neurological: She is alert. She is not disoriented.  Reflex Scores:      Tricep reflexes are 1+ on the right side and 1+ on the left side.      Bicep reflexes are 1+ on the right side and 1+ on the left side.      Brachioradialis reflexes are 1+ on the right side and 1+ on the left side.      5/5 hand Grip bilaterally.  Intact Touch five fingers bilaterally.        Assessment & Plan:

## 2011-07-28 DIAGNOSIS — L723 Sebaceous cyst: Secondary | ICD-10-CM | POA: Diagnosis not present

## 2011-08-09 DIAGNOSIS — Z950 Presence of cardiac pacemaker: Secondary | ICD-10-CM | POA: Diagnosis not present

## 2011-08-16 ENCOUNTER — Other Ambulatory Visit: Payer: Self-pay | Admitting: *Deleted

## 2011-08-16 DIAGNOSIS — H353290 Exudative age-related macular degeneration, unspecified eye, stage unspecified: Secondary | ICD-10-CM | POA: Diagnosis not present

## 2011-08-16 MED ORDER — NITROGLYCERIN 0.4 MG SL SUBL: 0.4000 mg | SUBLINGUAL_TABLET | SUBLINGUAL | Status: DC | PRN

## 2011-09-06 ENCOUNTER — Other Ambulatory Visit: Payer: Self-pay | Admitting: Family Medicine

## 2011-09-07 DIAGNOSIS — N6489 Other specified disorders of breast: Secondary | ICD-10-CM | POA: Diagnosis not present

## 2011-09-07 DIAGNOSIS — Z09 Encounter for follow-up examination after completed treatment for conditions other than malignant neoplasm: Secondary | ICD-10-CM | POA: Diagnosis not present

## 2011-09-15 ENCOUNTER — Other Ambulatory Visit: Payer: Medicare Other

## 2011-09-15 DIAGNOSIS — D509 Iron deficiency anemia, unspecified: Secondary | ICD-10-CM

## 2011-09-15 LAB — CBC
Hemoglobin: 11.7 g/dL — ABNORMAL LOW (ref 12.0–15.0)
RBC: 3.97 MIL/uL (ref 3.87–5.11)
WBC: 6.3 10*3/uL (ref 4.0–10.5)

## 2011-09-15 LAB — RETICULOCYTES
ABS Retic: 55.6 10*3/uL (ref 19.0–186.0)
RBC.: 3.97 MIL/uL (ref 3.87–5.11)
Retic Ct Pct: 1.4 % (ref 0.4–2.3)

## 2011-09-15 LAB — FERRITIN: Ferritin: 33 ng/mL (ref 10–291)

## 2011-09-15 NOTE — Progress Notes (Signed)
Cbc ferritin reticulocytes endomysial ab scrn titer iga - done 09/15/2011 McDaniel,J

## 2011-09-16 LAB — ENDOMYSIAL AB IGA RFLX TITER: Endomysial Screen: NEGATIVE

## 2011-09-29 ENCOUNTER — Ambulatory Visit (INDEPENDENT_AMBULATORY_CARE_PROVIDER_SITE_OTHER): Payer: Medicare Other | Admitting: Family Medicine

## 2011-09-29 ENCOUNTER — Encounter: Payer: Self-pay | Admitting: Family Medicine

## 2011-09-29 VITALS — BP 158/87 | HR 99 | Ht 64.5 in | Wt 182.0 lb

## 2011-09-29 DIAGNOSIS — L0390 Cellulitis, unspecified: Secondary | ICD-10-CM

## 2011-09-29 DIAGNOSIS — L0291 Cutaneous abscess, unspecified: Secondary | ICD-10-CM

## 2011-09-29 MED ORDER — DOXYCYCLINE HYCLATE 100 MG PO TABS: 100.0000 mg | ORAL_TABLET | Freq: Two times a day (BID) | ORAL | Status: DC

## 2011-09-29 NOTE — Progress Notes (Signed)
Subjective:     Patient ID: [name], female   DOB: 1923-09-07, 78 y.o.   MRN: [id number]  HPI 78 yo F presents for evaluation of LLE wound. Wound was sustained 10 days ago from a slip on  wooden floor at her friend's beach house. Her skin peeled up and she had a significant bruise. She treated the wound with Amins medicated powder and neosporin. She dressed the wound. She returned from the beach last night. She comes in today due to swelling in the leg, and redness. She denies pain in the leg as well as fever. Her gait is normal.    Review of Systems As per HPI  Objective:   Physical Exam BP 158/87  Pulse 99  Ht 5' 4.5" (1.638 m)  Wt 182 lb (82.555 kg)  BMI 30.76 kg/m2 General appearance: alert, cooperative and no distress Extremities: LLE with 2 x 4 cm diagonal anterior wound. There is induration and surrounding erythema with streaking down the leg. There area is milding tender around the inferior portion of the wound. There is no drainage. There is trace - 1+ pedal edema. DP pulse 1+. Gait: normal, walking unassisted.     Assessment and Plan:

## 2011-09-29 NOTE — Patient Instructions (Addendum)
Mrs. Henthorne,  Thank you very much for coming in today. For your cellulitis pleas do the following: 1. Keep Tegaderm on for seven days. Replace if it falls off.  2. Take doxycyline 100 mg by mouth twice daily for 10 days. No sunbathing while taking the antibiotic. Do not take on an empty stomach or less than 3 hrs before bed.   F/u with me in 1 weeks. Come back sooner if the redness or pain worsens.   Dr. Adrian Blackwater

## 2011-09-29 NOTE — Assessment & Plan Note (Signed)
A: cellulitis LLE from skin scrape.  P:  -cleaned wound with betadine and water.  -tegaderm x 7 days. Sent patient home with additional tegaderm if the applied one falls off.  -doxycycline 100 mg BID x 7-10 days. Warned against sunbathing, taking medication on an empty stomach and taking medicine before lying down.  -f/u in 1 week with me.

## 2011-10-03 ENCOUNTER — Telehealth: Payer: Self-pay | Admitting: *Deleted

## 2011-10-03 DIAGNOSIS — Z8551 Personal history of malignant neoplasm of bladder: Secondary | ICD-10-CM | POA: Diagnosis not present

## 2011-10-03 DIAGNOSIS — L0390 Cellulitis, unspecified: Secondary | ICD-10-CM

## 2011-10-03 DIAGNOSIS — N3941 Urge incontinence: Secondary | ICD-10-CM | POA: Diagnosis not present

## 2011-10-03 MED ORDER — DOXYCYCLINE HYCLATE 100 MG PO TABS: 100.0000 mg | ORAL_TABLET | Freq: Two times a day (BID) | ORAL | Status: DC

## 2011-10-03 NOTE — Telephone Encounter (Signed)
Rx was sent to [facility] instead of [facility].   Rx changed. [facility], Salome Spotted

## 2011-10-04 DIAGNOSIS — Z79899 Other long term (current) drug therapy: Secondary | ICD-10-CM | POA: Diagnosis not present

## 2011-10-04 DIAGNOSIS — J449 Chronic obstructive pulmonary disease, unspecified: Secondary | ICD-10-CM | POA: Diagnosis not present

## 2011-10-06 ENCOUNTER — Encounter: Payer: Self-pay | Admitting: Family Medicine

## 2011-10-06 ENCOUNTER — Ambulatory Visit (INDEPENDENT_AMBULATORY_CARE_PROVIDER_SITE_OTHER): Payer: Medicare Other | Admitting: Family Medicine

## 2011-10-06 VITALS — BP 156/88 | HR 80 | Temp 98.1°F | Wt 180.3 lb

## 2011-10-06 DIAGNOSIS — T148XXA Other injury of unspecified body region, initial encounter: Secondary | ICD-10-CM

## 2011-10-06 DIAGNOSIS — T07XXXA Unspecified multiple injuries, initial encounter: Secondary | ICD-10-CM

## 2011-10-06 DIAGNOSIS — L0390 Cellulitis, unspecified: Secondary | ICD-10-CM | POA: Diagnosis not present

## 2011-10-06 DIAGNOSIS — L0291 Cutaneous abscess, unspecified: Secondary | ICD-10-CM

## 2011-10-06 DIAGNOSIS — S139XXA Sprain of joints and ligaments of unspecified parts of neck, initial encounter: Secondary | ICD-10-CM | POA: Diagnosis not present

## 2011-10-06 MED ORDER — DOXYCYCLINE HYCLATE 100 MG PO TABS: 100.0000 mg | ORAL_TABLET | Freq: Two times a day (BID) | ORAL | Status: AC

## 2011-10-06 NOTE — Progress Notes (Signed)
Subjective:     Patient ID: [name], female   DOB: February 06, 1924, 78 y.o.   MRN: [id number]  HPI  78 year old female presents today for evaluation of LLE wound.    Patient fell approximately 3 weeks ago while walking from bathroom to den.  Patient states that her shoes were slick and that she slipped and fell on hard wood floor.  Patient fell directly on left side and developed a skin tear below her left knee. She also developed significant bruising on her left lower leg. She was recently seen in the clinic by Dr. Adrian Blackwater on 8/1.  She cleaned the wound with betadine and covered with Tegaderm.  Patient was also started on Doxycycline for surrounding cellulitis.   1) LLE wound - Patient reports continued drainage and slow healing. - She also reports that she did not start her antibiotics promptly (started on 8/6) and was not taking as prescribed. - No increasing redness.  Review of Systems No fevers, chills. Reports some mild SOB.    Objective:   Physical Exam General: well developed, well nourished. NAD. Heart:  RRR. No murmurs, rubs, or gallops. Lungs: CTAB. No rales, rhonchi, or wheeze.  Extremities: 7 cm x 1.5 cm anterior wound of the proximal tibia (just below the tibial tuberosity). Granulation tissue noted after wound was cleaned with gauze. Surrounding erythema noted.  Erythema also extends down lower extremity.      Assessment:    Plan:

## 2011-10-06 NOTE — Patient Instructions (Addendum)
I have given you a NEW prescription for an antibiotic.  Please DO NOT take the previous antibiotic.  Please take the Doxycycline TWICE a day for 7 days.  You can take 1 dose tonight.  Please follow up in 1-2 weeks.  I will set up a referral to [facility].

## 2011-10-06 NOTE — Assessment & Plan Note (Signed)
Cellulitis still present.  Per patient has not increased.  Patient given Doxycycline 100 mg BID x 7 days.  Patient was given Doxy at prior office visit but did not fill prescription until 8/6 and has only been taking once a day.  Therefore, gave new prescription and went over dosing/frequency.

## 2011-10-06 NOTE — Assessment & Plan Note (Signed)
Patients wound cleaned with gauze and re-dressed with Tegaderm.  Patient referred to wound care clinic.

## 2011-10-07 ENCOUNTER — Telehealth: Payer: Self-pay | Admitting: *Deleted

## 2011-10-07 ENCOUNTER — Ambulatory Visit: Payer: Medicare Other | Admitting: Family Medicine

## 2011-10-07 NOTE — Telephone Encounter (Signed)
Called and informed pt that an appt has been made for 8.21.2013 @ 845 AM at [facility] wound care center [place]. 3rd floor. Pt informed me that she would not be able to keep this appt due to being out of town and she will be returning on the day of the appt. I asked pt to call the Wound care center [phone] and reschedule her appt at her convenience. Pt voiced understanding and agreed.Audelia Hives Cass

## 2011-10-21 ENCOUNTER — Ambulatory Visit (HOSPITAL_BASED_OUTPATIENT_CLINIC_OR_DEPARTMENT_OTHER): Payer: Medicare Other

## 2011-11-07 ENCOUNTER — Ambulatory Visit (INDEPENDENT_AMBULATORY_CARE_PROVIDER_SITE_OTHER): Payer: Medicare Other | Admitting: Family Medicine

## 2011-11-07 ENCOUNTER — Encounter: Payer: Self-pay | Admitting: Family Medicine

## 2011-11-07 VITALS — BP 160/75 | HR 108 | Temp 98.3°F | Ht 64.5 in | Wt 177.0 lb

## 2011-11-07 DIAGNOSIS — R7309 Other abnormal glucose: Secondary | ICD-10-CM | POA: Diagnosis not present

## 2011-11-07 DIAGNOSIS — D509 Iron deficiency anemia, unspecified: Secondary | ICD-10-CM

## 2011-11-07 DIAGNOSIS — Z905 Acquired absence of kidney: Secondary | ICD-10-CM | POA: Diagnosis not present

## 2011-11-07 DIAGNOSIS — D508 Other iron deficiency anemias: Secondary | ICD-10-CM | POA: Diagnosis not present

## 2011-11-07 DIAGNOSIS — R928 Other abnormal and inconclusive findings on diagnostic imaging of breast: Secondary | ICD-10-CM | POA: Diagnosis not present

## 2011-11-07 DIAGNOSIS — M1710 Unilateral primary osteoarthritis, unspecified knee: Secondary | ICD-10-CM

## 2011-11-07 DIAGNOSIS — Z23 Encounter for immunization: Secondary | ICD-10-CM | POA: Diagnosis not present

## 2011-11-07 DIAGNOSIS — I10 Essential (primary) hypertension: Secondary | ICD-10-CM

## 2011-11-07 DIAGNOSIS — I495 Sick sinus syndrome: Secondary | ICD-10-CM | POA: Diagnosis not present

## 2011-11-07 DIAGNOSIS — J449 Chronic obstructive pulmonary disease, unspecified: Secondary | ICD-10-CM

## 2011-11-07 DIAGNOSIS — N183 Chronic kidney disease, stage 3 unspecified: Secondary | ICD-10-CM | POA: Diagnosis not present

## 2011-11-07 LAB — CBC
Hemoglobin: 11.9 g/dL — ABNORMAL LOW (ref 12.0–15.0)
MCH: 29.9 pg (ref 26.0–34.0)
MCV: 89.2 fL (ref 78.0–100.0)
Platelets: 245 10*3/uL (ref 150–400)
RBC: 3.98 MIL/uL (ref 3.87–5.11)

## 2011-11-07 MED ORDER — POLYETHYLENE GLYCOL 3350 17 GM/SCOOP PO POWD: 17.0000 g | Freq: Two times a day (BID) | ORAL | Status: AC | PRN

## 2011-11-07 NOTE — Patient Instructions (Signed)
You will receive the results of your lab results within 2 weeks. If you do not receive results; please call.

## 2011-11-08 LAB — BASIC METABOLIC PANEL
CO2: 27 mEq/L (ref 19–32)
Calcium: 9.3 mg/dL (ref 8.4–10.5)
Creat: 1.27 mg/dL — ABNORMAL HIGH (ref 0.50–1.10)
Glucose, Bld: 90 mg/dL (ref 70–99)

## 2011-11-08 NOTE — Assessment & Plan Note (Signed)
Recheck A1c next OFFICE VISIT.

## 2011-11-08 NOTE — Assessment & Plan Note (Signed)
Adequate control given age. No evidence of new end organ damage. Tolerating medication without significant adverse effects.  Continue current medication.

## 2011-11-08 NOTE — Assessment & Plan Note (Signed)
Lab Results  Component Value Date   CREATININE 1.27* 11/07/2011   BUN 27* 11/07/2011   NA 141 11/07/2011   K 4.3 11/07/2011   CL 105 11/07/2011   CO2 27 11/07/2011   Stable.  Plan to monitor. Solitary kidney.

## 2011-11-08 NOTE — Assessment & Plan Note (Signed)
Lab Results  Component Value Date   WBC 7.6 11/07/2011   HGB 11.9* 11/07/2011   HCT 35.5* 11/07/2011   MCV 89.2 11/07/2011   PLT 245 11/07/2011   Lab Results  Component Value Date   FERRITIN 42 11/07/2011   Improved hgb and iron stores. Recommend decrease iron sulfate 325 tab, one tab daily from 3 daily given constipation

## 2011-11-08 NOTE — Assessment & Plan Note (Addendum)
No recent COPD exacerbations. Stable on current LABD and ICS. Continue current management.

## 2011-11-08 NOTE — Assessment & Plan Note (Signed)
Adequate pain control with APAP and as needed tramadol.

## 2011-11-08 NOTE — Progress Notes (Signed)
  Subjective:    Patient ID: [name], female    DOB: 1924/02/17, 78 y.o.   MRN: [id number]  HPI  SSS/PAF No palpitaions No chest pain. No pain at pacemaker site Taking dailyaspirin.  No dyspepsia.  No melema. No BRBPR  HYPERTENSION  Disease Monitoring  Blood pressure range: not checking at home  Chest pain: no   Dyspnea: Only after climbing flight of stairs at home.  No worse than usual   Claudication: no   Medication compliance: Taking metoprolol as directed.  History of Acute gout. Medication Side Effects  Lightheadedness: no   Urinary frequency: no   Edema: no    Preventitive Healthcare:  Exercise: Was able to fly to [place] and spend three weeks down at [employer] without difficulty.     Diet Pattern: Working on limiting fats  Salt Restriction: none.  COPD Recent eval by Pulm at [facility], Dr Casper Harrison.  He was very pleased with the control of her disease process. No smoking. No cough currently.  Able to due ADLs without SHORTNESS OF BREATH.  No oral lesions or mouth soreness.   Multiple site Osteoarthritis No falls in last 6 months. Able to control knee pain with APAP as needed and tramadol for breakthrough pain.  Iron Deficiency Anemia Taking ferrous sulfate OTC one three times daily Having problems with constipation to point of having to self disimpact.  Interested in cutting down on iron supplementation.  No smoking   Review of Systems See HPI     Objective:   Physical Exam  Vitals reviewed. Constitutional: She is oriented to person, place, and time. She appears well-developed. No distress.  HENT:  Right Ear: External ear normal.  Left Ear: External ear normal.  Neck: Neck supple. No thyromegaly present.  Cardiovascular: Normal rate and regular rhythm.   No murmur heard. Pulmonary/Chest: Effort normal and breath sounds normal. No respiratory distress. She has no wheezes.  Abdominal: Soft. Bowel sounds are normal. She exhibits no distension.    Musculoskeletal: She exhibits edema.  Neurological: She is alert and oriented to person, place, and time.  Skin: Skin is warm and dry.  Psychiatric: She has a normal mood and affect. Her behavior is normal. Thought content normal.          Assessment & Plan:

## 2011-11-08 NOTE — Assessment & Plan Note (Signed)
Need to ensure that patient has followed up on recommended repeat mammogram in June 2013.

## 2011-11-14 ENCOUNTER — Other Ambulatory Visit: Payer: Self-pay | Admitting: *Deleted

## 2011-11-14 DIAGNOSIS — I495 Sick sinus syndrome: Secondary | ICD-10-CM | POA: Diagnosis not present

## 2011-11-14 DIAGNOSIS — Z950 Presence of cardiac pacemaker: Secondary | ICD-10-CM | POA: Diagnosis not present

## 2011-11-14 MED ORDER — CYCLOBENZAPRINE HCL 5 MG PO TABS: 5.0000 mg | ORAL_TABLET | Freq: Three times a day (TID) | ORAL | Status: DC | PRN

## 2011-11-15 DIAGNOSIS — H353290 Exudative age-related macular degeneration, unspecified eye, stage unspecified: Secondary | ICD-10-CM | POA: Diagnosis not present

## 2011-11-25 ENCOUNTER — Ambulatory Visit (HOSPITAL_BASED_OUTPATIENT_CLINIC_OR_DEPARTMENT_OTHER): Payer: Medicare Other

## 2011-12-12 ENCOUNTER — Ambulatory Visit (INDEPENDENT_AMBULATORY_CARE_PROVIDER_SITE_OTHER): Payer: Medicare Other | Admitting: Family Medicine

## 2011-12-12 ENCOUNTER — Encounter: Payer: Self-pay | Admitting: Family Medicine

## 2011-12-12 VITALS — BP 145/77 | HR 91 | Ht 64.5 in | Wt 181.0 lb

## 2011-12-12 DIAGNOSIS — R7309 Other abnormal glucose: Secondary | ICD-10-CM | POA: Diagnosis not present

## 2011-12-12 DIAGNOSIS — N3941 Urge incontinence: Secondary | ICD-10-CM

## 2011-12-12 DIAGNOSIS — M542 Cervicalgia: Secondary | ICD-10-CM | POA: Diagnosis not present

## 2011-12-12 DIAGNOSIS — D508 Other iron deficiency anemias: Secondary | ICD-10-CM

## 2011-12-12 DIAGNOSIS — D509 Iron deficiency anemia, unspecified: Secondary | ICD-10-CM

## 2011-12-12 DIAGNOSIS — I10 Essential (primary) hypertension: Secondary | ICD-10-CM

## 2011-12-12 LAB — POCT GLYCOSYLATED HEMOGLOBIN (HGB A1C): Hemoglobin A1C: 5.5

## 2011-12-12 NOTE — Patient Instructions (Addendum)
Increase iron to 2 tablets a day

## 2011-12-13 ENCOUNTER — Other Ambulatory Visit: Payer: Self-pay | Admitting: Family Medicine

## 2011-12-13 ENCOUNTER — Encounter: Payer: Self-pay | Admitting: Family Medicine

## 2011-12-13 DIAGNOSIS — N3941 Urge incontinence: Secondary | ICD-10-CM

## 2011-12-13 DIAGNOSIS — J449 Chronic obstructive pulmonary disease, unspecified: Secondary | ICD-10-CM

## 2011-12-13 HISTORY — DX: Urge incontinence: N39.41

## 2011-12-13 LAB — CBC
MCV: 90.5 fL (ref 78.0–100.0)
RDW: 14.9 % (ref 11.5–15.5)

## 2011-12-13 MED ORDER — TOLTERODINE TARTRATE 2 MG PO TABS: 2.0000 mg | ORAL_TABLET | Freq: Two times a day (BID) | ORAL | Status: DC

## 2011-12-13 MED ORDER — AZITHROMYCIN 250 MG PO TABS: ORAL_TABLET | ORAL | Status: DC

## 2011-12-13 NOTE — Progress Notes (Signed)
  Subjective:    Patient ID: [name], female    DOB: 1923/12/06, 78 y.o.   MRN: [id number]  HPI  Neck pain Onset: few days ago Location: area at nape of neck Quality: burning Severity: moderate Function: interfering with sleep.  No problems moving neck or arms Duration: 2 days Pattern: intermittent Course: stable Radiation: none Relief: no relief with tramadol Precipitant: none recalled Associated Symptoms: No fever, No numbness in arms or hands.  No new urinary or bowel symptoms. No weakness in limbs.  Trauma (Acute or Chronic): none Prior Diagnostic Testing or Treatments: Recent history of cervicalgia with cervical muscle spasm treated acutely and successfully by Dr Luiz Ochoa (Chiropractery) on referral by our office.  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  Prior thorough GI work up by Dr Cristina Gong last year. Taking ferrous sulfatde once a day for last month.  Dose decreased because of constipation with higher doses. Constipation improved on lower iron dose.  No new SHORTNESS OF BREATH. No palpitations. No chest pain. No near-syncope or dizziness.      Review of Systems      Objective:   Physical Exam  Constitutional: Vital signs are normal. She appears well-nourished. No distress.  Cardiovascular: Normal rate, regular rhythm and normal heart sounds.  Exam reveals no gallop.   Musculoskeletal: She exhibits no edema.  Neurological: She is alert. She has normal strength.  Reflex Scores:      Bicep reflexes are 2+ on the right side and 2+ on the left side.  Patellar reflexes are 1+ on the right side and 1+ on the left side.         Assessment & Plan:

## 2011-12-13 NOTE — Assessment & Plan Note (Signed)
Adequate BP control for age greater than 52.  No evidence of new end organ damage. Tolerating medicatons withour significant adverse effects. Plan to continue current regiment.

## 2011-12-13 NOTE — Assessment & Plan Note (Addendum)
Diagnosed in 9/13 by Dr Bjorn Loser (Urology), Started on [place] with decrease in nocturia from 4 times a night to once or twice a nite.  (+) dry eyes and dry mouth.  Pt anticipates being unable to afford the Toviaz brandname because of "Donut" hole at end of calendar year.  She is requesting a trial of a generic equivalent to Toviaz.   Recommended trial of Detrol 2 mg twice daily or 2 mg just at bedtime.  Discussed side effects including anticholinergic side effects, including ncrease in falls and confusion.  Patient would like to try trial of Detrol. Rx sent in.

## 2011-12-13 NOTE — Assessment & Plan Note (Signed)
No red flag findings.  Suspect component of somatic and neuropathic (non-radicular) discomfort Plan: Trial of her Flexeril 5 mg.  If no improvement recommend that [name] re-consult with Dr Luiz Ochoa (Chiropractery) for evaluation and treatment.

## 2011-12-13 NOTE — Assessment & Plan Note (Signed)
CBC:    Component Value Date/Time   WBC 7.0 12/12/2011 1647   HGB 10.3* 12/12/2011 1647   HCT 31.6* 12/12/2011 1647   PLT 269 12/12/2011 1647   MCV 90.5 12/12/2011 1647   Hgb down to 10.3 from 11.9 (11/07/11) Will suggest Increase of Ferrous sulfate from one to two tablets daily with recheck of HGB in January 2014.

## 2011-12-13 NOTE — Telephone Encounter (Signed)
Patient is calling for a refill on Zpak to go to [facility] on [address].

## 2011-12-19 DIAGNOSIS — H3530 Unspecified macular degeneration: Secondary | ICD-10-CM | POA: Diagnosis not present

## 2011-12-19 DIAGNOSIS — H5210 Myopia, unspecified eye: Secondary | ICD-10-CM | POA: Diagnosis not present

## 2011-12-19 DIAGNOSIS — H25019 Cortical age-related cataract, unspecified eye: Secondary | ICD-10-CM | POA: Diagnosis not present

## 2011-12-20 DIAGNOSIS — H353290 Exudative age-related macular degeneration, unspecified eye, stage unspecified: Secondary | ICD-10-CM | POA: Diagnosis not present

## 2011-12-29 DIAGNOSIS — H2589 Other age-related cataract: Secondary | ICD-10-CM | POA: Diagnosis not present

## 2011-12-29 DIAGNOSIS — H2510 Age-related nuclear cataract, unspecified eye: Secondary | ICD-10-CM | POA: Diagnosis not present

## 2012-01-08 ENCOUNTER — Encounter (HOSPITAL_COMMUNITY): Payer: Self-pay | Admitting: *Deleted

## 2012-01-08 ENCOUNTER — Encounter (HOSPITAL_COMMUNITY): Payer: Self-pay | Admitting: Emergency Medicine

## 2012-01-08 ENCOUNTER — Emergency Department (HOSPITAL_COMMUNITY): Payer: Medicare Other

## 2012-01-08 ENCOUNTER — Emergency Department (INDEPENDENT_AMBULATORY_CARE_PROVIDER_SITE_OTHER)
Admission: EM | Admit: 2012-01-08 | Discharge: 2012-01-08 | Disposition: A | Discharge status: Nursing Home (Includes ICF) | Payer: Medicare Other | Source: Home / Self Care | Attending: Family Medicine | Admitting: Family Medicine

## 2012-01-08 ENCOUNTER — Inpatient Hospital Stay (HOSPITAL_COMMUNITY)
Admission: EM | Admit: 2012-01-08 | Discharge: 2012-01-10 | DRG: 378 | Disposition: A | Discharge status: Home / Self Care | Payer: Medicare Other | Attending: Family Medicine | Admitting: Family Medicine

## 2012-01-08 DIAGNOSIS — D649 Anemia, unspecified: Secondary | ICD-10-CM

## 2012-01-08 DIAGNOSIS — M47817 Spondylosis without myelopathy or radiculopathy, lumbosacral region: Secondary | ICD-10-CM | POA: Diagnosis present

## 2012-01-08 DIAGNOSIS — Z96659 Presence of unspecified artificial knee joint: Secondary | ICD-10-CM | POA: Diagnosis not present

## 2012-01-08 DIAGNOSIS — Z950 Presence of cardiac pacemaker: Secondary | ICD-10-CM | POA: Diagnosis not present

## 2012-01-08 DIAGNOSIS — Z85528 Personal history of other malignant neoplasm of kidney: Secondary | ICD-10-CM | POA: Diagnosis not present

## 2012-01-08 DIAGNOSIS — R42 Dizziness and giddiness: Secondary | ICD-10-CM | POA: Diagnosis not present

## 2012-01-08 DIAGNOSIS — E538 Deficiency of other specified B group vitamins: Secondary | ICD-10-CM

## 2012-01-08 DIAGNOSIS — N184 Chronic kidney disease, stage 4 (severe): Secondary | ICD-10-CM | POA: Diagnosis present

## 2012-01-08 DIAGNOSIS — D126 Benign neoplasm of colon, unspecified: Secondary | ICD-10-CM

## 2012-01-08 DIAGNOSIS — D500 Iron deficiency anemia secondary to blood loss (chronic): Secondary | ICD-10-CM | POA: Diagnosis present

## 2012-01-08 DIAGNOSIS — F411 Generalized anxiety disorder: Secondary | ICD-10-CM

## 2012-01-08 DIAGNOSIS — R0602 Shortness of breath: Secondary | ICD-10-CM | POA: Diagnosis not present

## 2012-01-08 DIAGNOSIS — M542 Cervicalgia: Secondary | ICD-10-CM

## 2012-01-08 DIAGNOSIS — I4891 Unspecified atrial fibrillation: Secondary | ICD-10-CM | POA: Diagnosis not present

## 2012-01-08 DIAGNOSIS — I495 Sick sinus syndrome: Secondary | ICD-10-CM | POA: Diagnosis present

## 2012-01-08 DIAGNOSIS — Z79899 Other long term (current) drug therapy: Secondary | ICD-10-CM | POA: Diagnosis not present

## 2012-01-08 DIAGNOSIS — J309 Allergic rhinitis, unspecified: Secondary | ICD-10-CM

## 2012-01-08 DIAGNOSIS — Z87891 Personal history of nicotine dependence: Secondary | ICD-10-CM

## 2012-01-08 DIAGNOSIS — K922 Gastrointestinal hemorrhage, unspecified: Principal | ICD-10-CM | POA: Diagnosis present

## 2012-01-08 DIAGNOSIS — J449 Chronic obstructive pulmonary disease, unspecified: Secondary | ICD-10-CM | POA: Diagnosis not present

## 2012-01-08 DIAGNOSIS — H11411 Vascular abnormalities of conjunctiva, right eye: Secondary | ICD-10-CM

## 2012-01-08 DIAGNOSIS — E785 Hyperlipidemia, unspecified: Secondary | ICD-10-CM | POA: Diagnosis present

## 2012-01-08 DIAGNOSIS — Z905 Acquired absence of kidney: Secondary | ICD-10-CM

## 2012-01-08 DIAGNOSIS — I10 Essential (primary) hypertension: Secondary | ICD-10-CM

## 2012-01-08 DIAGNOSIS — D508 Other iron deficiency anemias: Secondary | ICD-10-CM

## 2012-01-08 DIAGNOSIS — M109 Gout, unspecified: Secondary | ICD-10-CM

## 2012-01-08 DIAGNOSIS — IMO0002 Reserved for concepts with insufficient information to code with codable children: Secondary | ICD-10-CM

## 2012-01-08 DIAGNOSIS — J4489 Other specified chronic obstructive pulmonary disease: Secondary | ICD-10-CM | POA: Diagnosis not present

## 2012-01-08 DIAGNOSIS — M1990 Unspecified osteoarthritis, unspecified site: Secondary | ICD-10-CM

## 2012-01-08 DIAGNOSIS — H3530 Unspecified macular degeneration: Secondary | ICD-10-CM

## 2012-01-08 DIAGNOSIS — M479 Spondylosis, unspecified: Secondary | ICD-10-CM

## 2012-01-08 DIAGNOSIS — I517 Cardiomegaly: Secondary | ICD-10-CM

## 2012-01-08 DIAGNOSIS — Z860100 Personal history of colon polyps, unspecified: Secondary | ICD-10-CM

## 2012-01-08 DIAGNOSIS — M1710 Unilateral primary osteoarthritis, unspecified knee: Secondary | ICD-10-CM

## 2012-01-08 DIAGNOSIS — I252 Old myocardial infarction: Secondary | ICD-10-CM

## 2012-01-08 DIAGNOSIS — M4316 Spondylolisthesis, lumbar region: Secondary | ICD-10-CM

## 2012-01-08 DIAGNOSIS — N3941 Urge incontinence: Secondary | ICD-10-CM

## 2012-01-08 DIAGNOSIS — I871 Compression of vein: Secondary | ICD-10-CM

## 2012-01-08 DIAGNOSIS — D509 Iron deficiency anemia, unspecified: Secondary | ICD-10-CM

## 2012-01-08 DIAGNOSIS — M48061 Spinal stenosis, lumbar region without neurogenic claudication: Secondary | ICD-10-CM

## 2012-01-08 DIAGNOSIS — D62 Acute posthemorrhagic anemia: Secondary | ICD-10-CM | POA: Diagnosis present

## 2012-01-08 DIAGNOSIS — N183 Chronic kidney disease, stage 3 unspecified: Secondary | ICD-10-CM

## 2012-01-08 DIAGNOSIS — K219 Gastro-esophageal reflux disease without esophagitis: Secondary | ICD-10-CM

## 2012-01-08 DIAGNOSIS — I129 Hypertensive chronic kidney disease with stage 1 through stage 4 chronic kidney disease, or unspecified chronic kidney disease: Secondary | ICD-10-CM | POA: Diagnosis present

## 2012-01-08 DIAGNOSIS — K449 Diaphragmatic hernia without obstruction or gangrene: Secondary | ICD-10-CM

## 2012-01-08 DIAGNOSIS — I480 Paroxysmal atrial fibrillation: Secondary | ICD-10-CM

## 2012-01-08 DIAGNOSIS — Z8719 Personal history of other diseases of the digestive system: Secondary | ICD-10-CM

## 2012-01-08 DIAGNOSIS — R7309 Other abnormal glucose: Secondary | ICD-10-CM

## 2012-01-08 LAB — BASIC METABOLIC PANEL
CO2: 21 mEq/L (ref 19–32)
Chloride: 109 mEq/L (ref 96–112)
GFR calc Af Amer: 40 mL/min — ABNORMAL LOW (ref >90)
Potassium: 4.3 mEq/L (ref 3.5–5.1)
Sodium: 140 mEq/L (ref 135–145)

## 2012-01-08 LAB — PREPARE RBC (CROSSMATCH)

## 2012-01-08 LAB — FERRITIN: Ferritin: 32 ng/mL (ref 10–291)

## 2012-01-08 LAB — POCT I-STAT, CHEM 8
BUN: 45 mg/dL — ABNORMAL HIGH (ref 6–23)
HCT: 22 % — ABNORMAL LOW (ref 36.0–46.0)
Hemoglobin: 7.5 g/dL — ABNORMAL LOW (ref 12.0–15.0)
Sodium: 145 mEq/L (ref 135–145)
TCO2: 19 mmol/L (ref 0–100)

## 2012-01-08 LAB — CBC
Platelets: 286 10*3/uL (ref 150–400)
RBC: 2.45 MIL/uL — ABNORMAL LOW (ref 3.87–5.11)
RDW: 14.7 % (ref 11.5–15.5)
WBC: 8 10*3/uL (ref 4.0–10.5)

## 2012-01-08 LAB — PROTIME-INR
INR: 0.93 (ref 0.00–1.49)
Prothrombin Time: 12.4 seconds (ref 11.6–15.2)

## 2012-01-08 LAB — RETICULOCYTES: Retic Count, Absolute: 120 10*3/uL (ref 19.0–186.0)

## 2012-01-08 MED ORDER — FERROUS SULFATE 325 (65 FE) MG PO TABS
650.0000 mg | ORAL_TABLET | Freq: Every day | ORAL | Status: DC
  Administered 2012-01-09 – 2012-01-10 (×2): 650 mg via ORAL
  Filled 2012-01-08 (×2): qty 2

## 2012-01-08 MED ORDER — OXYBUTYNIN CHLORIDE ER 10 MG PO TB24
10.0000 mg | ORAL_TABLET | Freq: Every day | ORAL | Status: DC
  Administered 2012-01-08 – 2012-01-09 (×2): 10 mg via ORAL
  Filled 2012-01-08 (×3): qty 1

## 2012-01-08 MED ORDER — METOPROLOL SUCCINATE ER 50 MG PO TB24
50.0000 mg | ORAL_TABLET | Freq: Every day | ORAL | Status: DC
  Administered 2012-01-09 – 2012-01-10 (×2): 50 mg via ORAL
  Filled 2012-01-08 (×2): qty 1

## 2012-01-08 MED ORDER — ADULT MULTIVITAMIN W/MINERALS CH
1.0000 | ORAL_TABLET | Freq: Every day | ORAL | Status: DC
  Administered 2012-01-08 – 2012-01-10 (×3): 1 via ORAL
  Filled 2012-01-08 (×3): qty 1

## 2012-01-08 MED ORDER — ALBUTEROL SULFATE HFA 108 (90 BASE) MCG/ACT IN AERS
2.0000 | INHALATION_SPRAY | Freq: Four times a day (QID) | RESPIRATORY_TRACT | Status: DC | PRN
  Filled 2012-01-08: qty 6.7

## 2012-01-08 MED ORDER — MOMETASONE FURO-FORMOTEROL FUM 100-5 MCG/ACT IN AERO
2.0000 | INHALATION_SPRAY | Freq: Two times a day (BID) | RESPIRATORY_TRACT | Status: DC
  Administered 2012-01-08 – 2012-01-10 (×4): 2 via RESPIRATORY_TRACT
  Filled 2012-01-08: qty 8.8

## 2012-01-08 MED ORDER — WALKER MISC: 1.0000 | Freq: Every day | Status: DC

## 2012-01-08 MED ORDER — PANTOPRAZOLE SODIUM 40 MG PO TBEC
80.0000 mg | DELAYED_RELEASE_TABLET | Freq: Every day | ORAL | Status: DC
  Administered 2012-01-09: 80 mg via ORAL
  Filled 2012-01-08 (×2): qty 1

## 2012-01-08 MED ORDER — TIOTROPIUM BROMIDE MONOHYDRATE 18 MCG IN CAPS
18.0000 ug | ORAL_CAPSULE | Freq: Every day | RESPIRATORY_TRACT | Status: DC
  Administered 2012-01-09 – 2012-01-10 (×2): 18 ug via RESPIRATORY_TRACT
  Filled 2012-01-08: qty 5

## 2012-01-08 MED ORDER — ONE-DAILY MULTI VITAMINS PO TABS: 1.0000 | ORAL_TABLET | Freq: Every day | ORAL | Status: DC

## 2012-01-08 MED ORDER — TRAMADOL HCL 50 MG PO TABS
50.0000 mg | ORAL_TABLET | Freq: Four times a day (QID) | ORAL | Status: DC | PRN
  Administered 2012-01-09 – 2012-01-10 (×2): 50 mg via ORAL
  Filled 2012-01-08 (×2): qty 1

## 2012-01-08 MED ORDER — FESOTERODINE FUMARATE ER 8 MG PO TB24
8.0000 mg | ORAL_TABLET | Freq: Every day | ORAL | Status: DC
  Administered 2012-01-09 – 2012-01-10 (×2): 8 mg via ORAL
  Filled 2012-01-08 (×2): qty 1

## 2012-01-08 MED ORDER — VITAMIN B-12 1000 MCG PO TABS
1000.0000 ug | ORAL_TABLET | Freq: Every day | ORAL | Status: DC
Start: 2012-01-09 — End: 2012-01-10
  Administered 2012-01-09 – 2012-01-10 (×2): 1000 ug via ORAL
  Filled 2012-01-08 (×2): qty 1

## 2012-01-08 MED ORDER — SIMVASTATIN 40 MG PO TABS
40.0000 mg | ORAL_TABLET | Freq: Every morning | ORAL | Status: DC
  Administered 2012-01-09 – 2012-01-10 (×2): 40 mg via ORAL
  Filled 2012-01-08 (×2): qty 1

## 2012-01-08 MED ORDER — CYCLOBENZAPRINE HCL 10 MG PO TABS: 5.0000 mg | ORAL_TABLET | Freq: Three times a day (TID) | ORAL | Status: DC | PRN

## 2012-01-08 MED ORDER — SODIUM CHLORIDE 0.9 % IJ SOLN
3.0000 mL | Freq: Two times a day (BID) | INTRAMUSCULAR | Status: DC
  Administered 2012-01-08 – 2012-01-09 (×3): 3 mL via INTRAVENOUS

## 2012-01-08 NOTE — ED Provider Notes (Signed)
History     CSN: [id number]  Arrival date & time 01/08/12  1131   First MD Initiated Contact with Patient 01/08/12 1145      Chief Complaint  Patient presents with  . Shortness of Breath    (Consider location/radiation/quality/duration/timing/severity/associated sxs/prior treatment) HPI Comments: 78 year old female with history of COPD, chronic anemia, CKD and nephrectomy. Here complaining of feeling tired, dizziness and shortness of breath with minimal exertion during the last week worsening in this the last 3 days. She has been taking oral iron 3 times a day during the last month and despite her hemoglobin continues to trend down. Today's point-of-care hemoglobin is 7.5 compared with 10.3 on CBC dated October 14. Denies abdominal pain. Denies chest pain.    Past Medical History  Diagnosis Date  . Candidiasis of the esophagus 11/26/2007  . Myocardial infarct, old   . COPD, severe   . Sick sinus syndrome with tachycardia   . Pacemaker   . Chronic kidney disease (CKD), stage III (moderate)   . Hypertension   . Acute appendicitis with rupture   . Spinal stenosis, lumbar   . Adrenal adenoma     Incidentaloma  . Spondylolisthesis of lumbar region   . Lumbar herniated disc     History of HNP L4/5 in 2003  . Hx of colonoscopy with polypectomy 04/27/2010    Dr Cristina Gong found three  tubular adenomas each less than 10 mm size  . Retinal hemorrhage of left eye 06/2010  . Cataract   . Abnormal mammogram, unspecified 08/23/2010    Followup imaging reassuring.  Repeat in 6 months.   Marland Kitchen DISC WITH RADICULOPATHY 04/27/2006    Qualifier: Diagnosis of  By: McDiarmid MD, Sherren Mocha      Past Surgical History  Procedure Date  . Pacemaker insertion     Dr Lovena Le ([facility])  . Nephrectomy For transition cell cancer     Dr Tresa Endo, surgeon  . Replacement total knee 2009, Right knee    Dr Wynelle Link  . Replacement total knee 05/2009, Left knee    Dr Wynelle Link  . Knee arthroscopy w/  synovectomy 11/2009, left knee    Dr Wynelle Link  . Cholecystectomy   . Esophagogastroduodenoscopy 04/27/2010    Dr Cristina Gong - found transient H/H & Schatzki's ring    Family History  Problem Relation Age of Onset  . Family history unknown: Yes    History  Substance Use Topics  . Smoking status: Former Smoker    Types: Cigarettes  . Smokeless tobacco: Never Used  . Alcohol Use: 0.5 oz/week    1 drink(s) per week    OB History    Grav Para Term Preterm Abortions TAB SAB Ect Mult Living                  Review of Systems  Constitutional: Positive for fatigue. Negative for fever, chills, diaphoresis and appetite change.  Respiratory: Positive for shortness of breath. Negative for cough, chest tightness and wheezing.   Gastrointestinal: Positive for constipation. Negative for nausea, vomiting, abdominal pain, diarrhea, abdominal distention and anal bleeding.  Skin: Negative for rash.  Neurological: Positive for dizziness and headaches. Negative for tremors, seizures and numbness.  All other systems reviewed and are negative.    Allergies  Codeine phosphate; Hydrochlorothiazide; and Montelukast sodium  Home Medications   Current Outpatient Rx  Name  Route  Sig  Dispense  Refill  . ALBUTEROL SULFATE HFA 108 (90 BASE) MCG/ACT IN AERS   Inhalation  Inhale 2 puffs into the lungs every 6 (six) hours as needed. If needed for shortness of breath.   1 Inhaler   3   . ASPIRIN 325 MG PO TABS      Half tablet by mouth daily.          . AZITHROMYCIN 250 MG PO TABS      Take two tablets by mouth on first day then one tablet by mouth daily for 4 additional days.   6 each   2     Patient may self-initiate refills over next year.   Marland Kitchen CALCIUM CARBONATE ANTACID 750 MG PO CHEW   Oral   Chew 1 tablet by mouth 2 (two) times daily.           . CYCLOBENZAPRINE HCL 5 MG PO TABS   Oral   Take 1 tablet (5 mg total) by mouth 3 (three) times daily as needed.   30 tablet   3   .  FERROUS SULFATE 325 (65 FE) MG PO TABS   Oral   Take 325 mg by mouth daily. One tablet once daily         . FESOTERODINE FUMARATE ER 8 MG PO TB24   Oral   Take 8 mg by mouth daily. Rx started by Dr Bjorn Loser [facility])         . FLUTICASONE-SALMETEROL 250-50 MCG/DOSE IN AEPB   Inhalation   Inhale 1 puff into the lungs 2 (two) times daily.           . FUROSEMIDE 40 MG PO TABS   Oral   Take 1 tablet (40 mg total) by mouth daily as needed (leg swelling).   30 tablet   3   . WALKER MISC   Does not apply   1 each by Does not apply route daily. Folding Walker with wheels and walker seat attachement   1 each   0   . NITROGLYCERIN 0.4 MG SL SUBL   Sublingual   Place 1 tablet (0.4 mg total) under the tongue every 5 (five) minutes as needed.   30 tablet   1   . POTASSIUM CHLORIDE CRYS ER 20 MEQ PO TBCR   Oral   Take 2 tablets (40 mEq total) by mouth daily as needed (When take Lasix (furosemide)).   30 tablet   11     Please deliver prescription to home   . TOLTERODINE TARTRATE 2 MG PO TABS   Oral   Take 1 tablet (2 mg total) by mouth 2 (two) times daily.   60 tablet   2     Please deliver to patient's home address.   . TRAMADOL HCL 50 MG PO TABS   Oral   Take 1 tablet (50 mg total) by mouth every 6 (six) hours as needed for pain. 1-2 tablets four times a day as needed for pain.   60 tablet   3   . VITAMIN B-12 1000 MCG PO TABS   Oral   Take 1,000 mcg by mouth daily.          Marland Kitchen BEVACIZUMAB INTRAVITREAL INJECTION 1.25 MG/0.1 ML   Intravitreal   1.25 mg by Intravitreal route to Surgery. Administered by Retinal ophthalmologist         . LORAZEPAM 0.5 MG PO TABS   Oral   Take 1 tablet (0.5 mg total) by mouth every 8 (eight) hours as needed for Anxiety.   30 tablet      .  MAGNESIUM HYDROXIDE 400 MG/5ML PO SUSP   Oral   Take by mouth daily as needed.           Marland Kitchen METOPROLOL SUCCINATE ER 50 MG PO TB24   Oral   Take 1 tablet (50 mg total)  by mouth daily.   90 tablet   2   . ONE-DAILY MULTI VITAMINS PO TABS   Oral   Take 1 tablet by mouth daily.           Marland Kitchen OMEPRAZOLE 40 MG PO CPDR   Oral   Take 1 capsule (40 mg total) by mouth daily.   30 capsule   12   . SIMVASTATIN 40 MG PO TABS   Oral   Take 1 tablet (40 mg total) by mouth at bedtime.   30 tablet   12   . TIOTROPIUM BROMIDE MONOHYDRATE 18 MCG IN CAPS   Inhalation   Place 1 capsule (18 mcg total) into inhaler and inhale daily.   30 capsule   PRN     BP 127/70  Pulse 81  Temp 99.3 F (37.4 C) (Oral)  Resp 16  SpO2 98%  Physical Exam  Nursing note and vitals reviewed. Constitutional: She is oriented to person, place, and time. She appears well-developed and well-nourished. No distress.       Pleasant, talkative. Well-groomed.  HENT:  Head: Normocephalic and atraumatic.  Mouth/Throat: Oropharynx is clear and moist.  Eyes: No scleral icterus.       Pale Conjunctiva  Neck: Neck supple. No JVD present. No thyromegaly present.  Cardiovascular: Normal rate, regular rhythm and normal heart sounds.  Exam reveals no gallop and no friction rub.   No murmur heard. Pulmonary/Chest: Effort normal and breath sounds normal. No respiratory distress. She has no wheezes. She has no rales. She exhibits no tenderness.  Abdominal: Soft. There is no tenderness.  Neurological: She is alert and oriented to person, place, and time.  Skin: No rash noted. She is not diaphoretic.    ED Course  Procedures (including critical care time)  Labs Reviewed  POCT I-STAT, CHEM 8 - Abnormal; Notable for the following:    Chloride 114 (*)     BUN 45 (*)     Creatinine, Ser 1.40 (*)     Hemoglobin 7.5 (*)     HCT 22.0 (*)     All other components within normal limits   No results found.   1. Anemia   2. Dizziness    EKG: has atrial pacer rhythm, with a rate at 61 beats per minutes. Left bundle branch block. There are nonspecific ST changes. There are T-wave inversions  in lateral leads and aVF and aVL minimally changed from prior EKG used for comparison dated 12/07/2008.   MDM  78 year old female with history of COPD, chronic anemia, CKD and nephrectomy. Here complaining of dizziness and shortness of breath worsening in this the last 3 days. Today's point-of-care hemoglobin is 7.5 and hematocrit 22 compared with 10.3 and 31.6 respectively on CBC dated October 14. Also point-of-care creatinine marginally worse at 1.4 from 1.2 (on September 9/13). On exam. Patient is pleasant interactive and appears in no distress. Has pale mucous membranes. She is breathing comfortably with an oxygen saturation of 87% on room air. No tachypnea and clear lung exam. Blood pressure is 127/70 and stable. Vital signs are stable. Decided to transfer to the emergency department for further evaluation and management via shuttle.  Randa Spike, MD 01/08/12 1241

## 2012-01-08 NOTE — ED Notes (Signed)
Patient transported to CT 

## 2012-01-08 NOTE — Progress Notes (Signed)
[name] [id number] Admitted to 5500: 01/08/2012 20:35 Attending Provider: Lind Covert, MD    [name] is a 78 y.o. female patient admitted from ED awake, alert  & orientated  X 3,  DNR, VSS - Blood pressure 115/53, pulse 59, temperature 98.3 F (36.8 C), temperature source Oral, resp. rate 20, height 5\' 4"  (1.626 m), weight 80 kg (176 lb 5.9 oz), SpO2 100.00%., no c/o shortness of breath, no c/o chest pain, no distress noted. Tele # [id number] placed and pt is a-paced.   IV site WDL: with a transparent dsg that's clean dry and intact.  Allergies:   Allergies  Allergen Reactions  . Codeine Phosphate Itching    REACTION: unspecified  . Hydrochlorothiazide Other (See Comments)    Possible acute gouty arthritis of wrist  . Montelukast Sodium     REACTION: unspecified     Past Medical History  Diagnosis Date  . Candidiasis of the esophagus 11/26/2007  . Myocardial infarct, old   . COPD, severe   . Sick sinus syndrome with tachycardia   . Pacemaker   . Chronic kidney disease (CKD), stage III (moderate)   . Hypertension   . Acute appendicitis with rupture   . Spinal stenosis, lumbar   . Adrenal adenoma     Incidentaloma  . Spondylolisthesis of lumbar region   . Lumbar herniated disc     History of HNP L4/5 in 2003  . Hx of colonoscopy with polypectomy 04/27/2010    Dr Cristina Gong found three  tubular adenomas each less than 10 mm size  . Retinal hemorrhage of left eye 06/2010  . Cataract   . Abnormal mammogram, unspecified 08/23/2010    Followup imaging reassuring.  Repeat in 6 months.   Marland Kitchen DISC WITH RADICULOPATHY 04/27/2006    Qualifier: Diagnosis of  By: McDiarmid MD, Sherren Mocha      History:  obtained from patient  Pt orientation to unit, room and routine. Information packet given to patient and safety video refused.  Admission INP armband ID verified with patient, and in place. SR up x 2, fall risk assessment complete with Patient verbalizing understanding of risks associated  with falls. Pt verbalizes an understanding of how to use the call bell and to call for help before getting out of bed.  Skin, clean-dry- intact without evidence of bruising, or skin tears.   No evidence of skin break down noted on exam.    Will cont to monitor and assist as needed.  Parthenia Ames, RN 01/08/2012 11:46 PM

## 2012-01-08 NOTE — ED Provider Notes (Signed)
Medical screening examination/treatment/procedure(s) were conducted as a shared visit with non-physician practitioner(s) and myself.  I personally evaluated the patient during the encounter On my exam this pleasant elderly female was in no distress.  However given her symptomatic anemia she'll be admitted for further evaluation and management.  Carmin Muskrat, MD 01/08/12 1650

## 2012-01-08 NOTE — H&P (Signed)
[name] is an 78 y.o. female.    Chief Complaint: Shortness of breath  HPI: [name] is a 78 y.o. female with history of anemia with baseline Hgb around 10, MI, sick sinus syndrome with pacemaker, paroxysmal atrial fibrillation, HTN, CKD stage III and other chronic medical problems here with shortness of breath progressive over the past month.  Patient reports getting very winded with activity and she says this feels like her symptomatic anemia has in the past.  She also reports worsening numbness/coldness in her fingers for 1 month and dizziness for the past 3 days.  She denies chest pain, abdominal pain, weight loss, nausea, vomiting, travel, frank blood in urine or stool, leg swelling, recent falls since July, or fevers.  Stool remains dark but patient attributes this to PO iron.  Appetite is within normal limits though patient states she does not eat much.    In the ED, CBC showed hemoglobin of 7.5, BMET showed Cr 1.4 around patient's baseline, and EKG showed normal sinus rhythm, inverted T waves, and partial left bundle branch block.  Patient was type and screened.  FOBT done and positive.   Previous workup of anemia has not elucidated cause.  Fe in 2012 low, so patient put on ferrous sulfate 650mg  daily (cannot tolerate 325mg  TID dosing due to constipation).     Past Medical History  Diagnosis Date  . Candidiasis of the esophagus 11/26/2007  . Myocardial infarct, old   . COPD, severe   . Sick sinus syndrome with tachycardia   . Pacemaker   . Chronic kidney disease (CKD), stage III (moderate)   . Hypertension   . Acute appendicitis with rupture   . Spinal stenosis, lumbar   . Adrenal adenoma     Incidentaloma  . Spondylolisthesis of lumbar region   . Lumbar herniated disc     History of HNP L4/5 in 2003  . Hx of colonoscopy with polypectomy 04/27/2010    Dr Cristina Gong found three  tubular adenomas each less than 10 mm size  . Retinal hemorrhage of left eye 06/2010  . Cataract   .  Abnormal mammogram, unspecified 08/23/2010    Followup imaging reassuring.  Repeat in 6 months.   Marland Kitchen DISC WITH RADICULOPATHY 04/27/2006    Qualifier: Diagnosis of  By: McDiarmid MD, Sherren Mocha     Last colonoscopy reported in system in 2009, with transverse colon adenomatous polyps Ascending cecum adenomatous polyp Rectum adenomatous polyps Recommended repeat colonoscopy 10-2010   Past Surgical History  Procedure Date  . Pacemaker insertion     Dr Lovena Le ([facility])  . Nephrectomy For transition cell cancer     Dr Tresa Endo, surgeon  . Replacement total knee 2009, Right knee    Dr Wynelle Link  . Replacement total knee 05/2009, Left knee    Dr Wynelle Link  . Knee arthroscopy w/ synovectomy 11/2009, left knee    Dr Wynelle Link  . Cholecystectomy   . Esophagogastroduodenoscopy 04/27/2010    Dr Cristina Gong - found transient H/H & Schatzki's ring  Right cataract surgery 1 week ago; plan in Dec for left  No family history on file. Social History:  reports that she has quit smoking. Her smoking use included Cigarettes. She has never used smokeless tobacco. She reports that she drinks about 3 ounces of alcohol per week. She reports that she does not use illicit drugs. 4-6 glasses alcohol per week No smoking Lives part-time with daughter.  Allergies:  Allergies  Allergen Reactions  . Codeine  Phosphate Itching    REACTION: unspecified  . Hydrochlorothiazide Other (See Comments)    Possible acute gouty arthritis of wrist  .        Not allergic to singulair JUST codeine  Medications Prior to Admission  Medication Sig Dispense Refill  . albuterol (VENTOLIN HFA) 108 (90 BASE) MCG/ACT inhaler Inhale 2 puffs into the lungs every 6 (six) hours as needed. If needed for shortness of breath.  1 Inhaler  3  . aspirin 325 MG tablet Half tablet by mouth daily.       . cyclobenzaprine (FLEXERIL) 5 MG tablet Take 1 tablet (5 mg total) by mouth 3 (three) times daily as needed.  30 tablet  3  . ferrous sulfate  325 (65 FE) MG tablet Take 650 mg by mouth daily. One tablet once daily      . fesoterodine (TOVIAZ) 8 MG TB24 Take 8 mg by mouth daily. Rx started by Dr Bjorn Loser [facility])      . Fluticasone-Salmeterol (ADVAIR DISKUS) 250-50 MCG/DOSE AEPB Inhale 1 puff into the lungs 2 (two) times daily.        . furosemide (LASIX) 40 MG tablet Take 1 tablet (40 mg total) by mouth daily as needed (leg swelling).  30 tablet  3  . metoprolol succinate (TOPROL-XL) 50 MG 24 hr tablet Take 1 tablet (50 mg total) by mouth daily.  90 tablet  2  . Multiple Vitamin (MULTIVITAMIN) tablet Take 1 tablet by mouth daily.        . nitroGLYCERIN (NITROSTAT) 0.4 MG SL tablet Place 1 tablet (0.4 mg total) under the tongue every 5 (five) minutes as needed.  30 tablet  1  . omeprazole (PRILOSEC) 40 MG capsule Take 1 capsule (40 mg total) by mouth daily.  30 capsule  12  . simvastatin (ZOCOR) 40 MG tablet Take 40 mg by mouth every morning.      . tiotropium (SPIRIVA) 18 MCG inhalation capsule Place 1 capsule (18 mcg total) into inhaler and inhale daily.  30 capsule  PRN  . tolterodine (DETROL) 2 MG tablet Take 1 tablet (2 mg total) by mouth 2 (two) times daily.  60 tablet  2  . traMADol (ULTRAM) 50 MG tablet Take 1 tablet (50 mg total) by mouth every 6 (six) hours as needed for pain. 1-2 tablets four times a day as needed for pain.  60 tablet  3  . vitamin B-12 (CYANOCOBALAMIN) 1000 MCG tablet Take 1,000 mcg by mouth daily.       Marland Kitchen azithromycin (ZITHROMAX) 250 MG tablet Take two tablets by mouth on first day then one tablet by mouth daily for 4 additional days.      . Misc. Devices (WALKER) MISC 1 each by Does not apply route daily. Folding Walker with wheels and walker seat attachement  1 each  0    Results for orders placed during the hospital encounter of 01/08/12 (from the past 48 hour(s))  CBC     Status: Abnormal   Collection Time   01/08/12  2:01 PM      Component Value Range Comment   WBC 8.0  4.0 - 10.5  K/uL    RBC 2.45 (*) 3.87 - 5.11 MIL/uL    Hemoglobin 7.5 (*) 12.0 - 15.0 g/dL    HCT 22.9 (*) 36.0 - 46.0 %    MCV 93.5  78.0 - 100.0 fL    MCH 30.6  26.0 - 34.0 pg    MCHC 32.8  30.0 - 36.0 g/dL    RDW 14.7  11.5 - 15.5 %    Platelets 286  150 - 400 K/uL   BASIC METABOLIC PANEL     Status: Abnormal   Collection Time   01/08/12  2:01 PM      Component Value Range Comment   Sodium 140  135 - 145 mEq/L    Potassium 4.3  3.5 - 5.1 mEq/L    Chloride 109  96 - 112 mEq/L    CO2 21  19 - 32 mEq/L    Glucose, Bld 128 (*) 70 - 99 mg/dL    BUN 43 (*) 6 - 23 mg/dL    Creatinine, Ser 1.35 (*) 0.50 - 1.10 mg/dL    Calcium 8.5  8.4 - 10.5 mg/dL    GFR calc non Af Amer 35 (*) >90 mL/min    GFR calc Af Amer 40 (*) >90 mL/min   PROTIME-INR     Status: Normal   Collection Time   01/08/12  2:01 PM      Component Value Range Comment   Prothrombin Time 12.4  11.6 - 15.2 seconds    INR 0.93  0.00 - 1.49   TYPE AND SCREEN     Status: Normal   Collection Time   01/08/12  2:07 PM      Component Value Range Comment   ABO/RH(D) A POS      Antibody Screen NEG      Sample Expiration 01/11/2012     TROPONIN I     Status: Normal   Collection Time   01/08/12  5:14 PM      Component Value Range Comment   Troponin I <0.30  <0.30 ng/mL   RETICULOCYTES     Status: Abnormal   Collection Time   01/08/12  5:15 PM      Component Value Range Comment   Retic Ct Pct 4.8 (*) 0.4 - 3.1 %    RBC. 2.50 (*) 3.87 - 5.11 MIL/uL    Retic Count, Manual 120.0  19.0 - 186.0 K/uL    FOBT positive  ROS Per HPI; otherwise no complaints.  Blood pressure 114/64, pulse 61, temperature 97.6 F (36.4 C), temperature source Oral, resp. rate 18, SpO2 100.00%. Physical Exam  GEN: NAD, pleasant, lying in bed HEENT: Sclera clear, palpebral conjunctiva pale, EOMI, PERRLA, TMs obscured by wax though no abnormality appreciated, o/p clear, slightly dry-appearing mucous membranes CV: RRR, no murmurs, rubs, or gallops, 4 second  capillary refill, radial pulses difficult to feel PULM: CTAB, no wheezes or crackles ABD: soft, nontender, nondistended, NABS BACK: nontender to palpation SKIN: No rashes or cyanosis; face and conjunctiva mildly pale-appearing NEURO: Alert, oriented, CN 2-12 grossly in tact with no focal deficits noted   Assessment/Plan  SHARIAN TROTTER is a 78 y.o. female with history of anemia with baseline Hgb around 10, MI, paroxysmal atrial fibrillation, HTN, CKD stage III and other chronic medical problems here with shortness of breath and Hgb 7.5 (baseline ~10).  1. Shortness of breath - Most likely due to symptomatic anemia with Hgb 7.5, well below baseline of 10.  However, cannot exclude ACS in patient with h/o MI.   - Admit to Inpatient Family Medicine Teaching Service to telemetry, Attending Dr. Erin Hearing - Troponin I to evaluate for myocardial strain/damage - Treat acute on chronic anemia (see below)  2. Acute on chronic anemia - Likely due to GI losses for weeks, given onset of symptoms 1 month ago and  positive FOBT today; chronic anemia is iron-deficiency type - Patient already typed and screened; transfuse 2 units pRBCs now - Continue home Ferrous sulfate 650mg  daily - Post-transfusion CBC - Recommend colonoscopy if patient has not had this since last showing in EPIC in 2009, which is recommending follow-up for adenomatous polyps in 10/2010.  3. Atrial fibrillation - stable - Metoprolol XL 50mg  daily  4. HTN - stable - Continue home metoprolol  5. CKD Stage III - patient has 1 kidney; h/o transitional cell cancer with removal of other kidney - stable  6. COPD - stable - Continue home Dulera 2 puffs BID - Continue home tiotropium 18mcg daily and Albuterol 2 puffs q6 hours prn  7. GERD with h/o schatzki's ring  - stable - Protonix 80 mg daily (substitute for home omeprazole)  8. Sick sinus syndrome - with pacemaker - stable  9. Urge incontinence - stable - Continue home fesoterodine  and Oxybutynin 24hr 10 mg daily qhs (substitue for detrol)  10. HLD hx - stable - Continue home Simvastatin 40mg  qam  11. Osteoarthritis lumbar spine and cervicalgia - Continue home flexeril prn muscle spasms and tramadol prn pain  FEN/GI -  F: KVO N: Heart Healthy diet - Continue home B12   PPX: - SCDs; no anticoagulation now given anemia and transfusion  CODE status: DNR/DNI  DISCHARGE PLANNING: Pending transfusion and resolution of symptoms   Conni Slipper 01/08/2012, 7:31 PM Service pager [phone]  I examined the patient with Dr. Dianah Field. I have reviewed the note, made necessary revisions and agree with above.  Danh Bayus 01/08/12, 9:53 PM

## 2012-01-08 NOTE — ED Notes (Signed)
Pt reports feeling weakness, tired and shortness of breath x 1 week. Pt seen at urgent care and sent here for further eval. Pt currently takes iron pills and has black stools.

## 2012-01-08 NOTE — ED Notes (Signed)
Pt reports that for one week she has been feeling abnormally tired, weak, short of breath with very little exertion and pale conjunctiva. Reports hx of unknown blood loss  - currently taking iron pills - unable to tell if stools are black due to discoloration from iron.

## 2012-01-08 NOTE — ED Provider Notes (Signed)
History     CSN: [id number]  Arrival date & time 01/08/12  1235   First MD Initiated Contact with Patient 01/08/12 1336      Chief Complaint  Patient presents with  . Anemia    (Consider location/radiation/quality/duration/timing/severity/associated sxs/prior treatment) HPI Comments: Patient with history of anemia of unknown origin, renal insufficiency -- presents today with complaint of worsening shortness of breath with activity over the past several days. She has felt similarly in the past when her hemoglobin is low. She has had previous endoscopies. Patient otherwise denies fever, cough, chest pain abdominal pain, nausea, vomiting, diarrhea, urinary symptoms. Patient states that she takes 2 iron pills a day and has black tarry stools on a daily basis because of this. Onset gradual. Course is gradually worsening. Nothing makes symptoms better.  Patient is a 78 y.o. female presenting with anemia. The history is provided by the patient.  Anemia Associated symptoms include headaches. Pertinent negatives include no abdominal pain, chest pain, coughing, fever, myalgias, nausea, rash, sore throat or vomiting.    Past Medical History  Diagnosis Date  . Candidiasis of the esophagus 11/26/2007  . Myocardial infarct, old   . COPD, severe   . Sick sinus syndrome with tachycardia   . Pacemaker   . Chronic kidney disease (CKD), stage III (moderate)   . Hypertension   . Acute appendicitis with rupture   . Spinal stenosis, lumbar   . Adrenal adenoma     Incidentaloma  . Spondylolisthesis of lumbar region   . Lumbar herniated disc     History of HNP L4/5 in 2003  . Hx of colonoscopy with polypectomy 04/27/2010    Dr Cristina Gong found three  tubular adenomas each less than 10 mm size  . Retinal hemorrhage of left eye 06/2010  . Cataract   . Abnormal mammogram, unspecified 08/23/2010    Followup imaging reassuring.  Repeat in 6 months.   Marland Kitchen DISC WITH RADICULOPATHY 04/27/2006    Qualifier:  Diagnosis of  By: McDiarmid MD, Sherren Mocha      Past Surgical History  Procedure Date  . Pacemaker insertion     Dr Lovena Le ([facility])  . Nephrectomy For transition cell cancer     Dr Tresa Endo, surgeon  . Replacement total knee 2009, Right knee    Dr Wynelle Link  . Replacement total knee 05/2009, Left knee    Dr Wynelle Link  . Knee arthroscopy w/ synovectomy 11/2009, left knee    Dr Wynelle Link  . Cholecystectomy   . Esophagogastroduodenoscopy 04/27/2010    Dr Cristina Gong - found transient H/H & Schatzki's ring    No family history on file.  History  Substance Use Topics  . Smoking status: Former Smoker    Types: Cigarettes  . Smokeless tobacco: Never Used  . Alcohol Use: 0.5 oz/week    1 drink(s) per week    OB History    Grav Para Term Preterm Abortions TAB SAB Ect Mult Living                  Review of Systems  Constitutional: Negative for fever.  HENT: Negative for sore throat and rhinorrhea.   Eyes: Negative for redness.  Respiratory: Positive for shortness of breath. Negative for cough.   Cardiovascular: Negative for chest pain.  Gastrointestinal: Negative for nausea, vomiting, abdominal pain and diarrhea.  Genitourinary: Negative for dysuria.  Musculoskeletal: Negative for myalgias.  Skin: Negative for rash.  Neurological: Positive for dizziness and headaches.    Allergies  Codeine  phosphate; Hydrochlorothiazide; and Montelukast sodium  Home Medications   Current Outpatient Rx  Name  Route  Sig  Dispense  Refill  . ALBUTEROL SULFATE HFA 108 (90 BASE) MCG/ACT IN AERS   Inhalation   Inhale 2 puffs into the lungs every 6 (six) hours as needed. If needed for shortness of breath.   1 Inhaler   3   . ASPIRIN 325 MG PO TABS      Half tablet by mouth daily.          . CYCLOBENZAPRINE HCL 5 MG PO TABS   Oral   Take 1 tablet (5 mg total) by mouth 3 (three) times daily as needed.   30 tablet   3   . FERROUS SULFATE 325 (65 FE) MG PO TABS   Oral   Take 650 mg  by mouth daily. One tablet once daily         . FESOTERODINE FUMARATE ER 8 MG PO TB24   Oral   Take 8 mg by mouth daily. Rx started by Dr Bjorn Loser [facility])         . FLUTICASONE-SALMETEROL 250-50 MCG/DOSE IN AEPB   Inhalation   Inhale 1 puff into the lungs 2 (two) times daily.           . FUROSEMIDE 40 MG PO TABS   Oral   Take 1 tablet (40 mg total) by mouth daily as needed (leg swelling).   30 tablet   3   . METOPROLOL SUCCINATE ER 50 MG PO TB24   Oral   Take 1 tablet (50 mg total) by mouth daily.   90 tablet   2   . ONE-DAILY MULTI VITAMINS PO TABS   Oral   Take 1 tablet by mouth daily.           Marland Kitchen NITROGLYCERIN 0.4 MG SL SUBL   Sublingual   Place 1 tablet (0.4 mg total) under the tongue every 5 (five) minutes as needed.   30 tablet   1   . OMEPRAZOLE 40 MG PO CPDR   Oral   Take 1 capsule (40 mg total) by mouth daily.   30 capsule   12   . SIMVASTATIN 40 MG PO TABS   Oral   Take 40 mg by mouth every morning.         Marland Kitchen TIOTROPIUM BROMIDE MONOHYDRATE 18 MCG IN CAPS   Inhalation   Place 1 capsule (18 mcg total) into inhaler and inhale daily.   30 capsule   PRN   . TOLTERODINE TARTRATE 2 MG PO TABS   Oral   Take 1 tablet (2 mg total) by mouth 2 (two) times daily.   60 tablet   2     Please deliver to patient's home address.   . TRAMADOL HCL 50 MG PO TABS   Oral   Take 1 tablet (50 mg total) by mouth every 6 (six) hours as needed for pain. 1-2 tablets four times a day as needed for pain.   60 tablet   3   . VITAMIN B-12 1000 MCG PO TABS   Oral   Take 1,000 mcg by mouth daily.          . AZITHROMYCIN 250 MG PO TABS      Take two tablets by mouth on first day then one tablet by mouth daily for 4 additional days.         Marland Kitchen WALKER MISC   Does not  apply   1 each by Does not apply route daily. Folding Walker with wheels and walker seat attachement   1 each   0     BP 94/78  Pulse 65  Temp 98.2 F (36.8 C) (Oral)   Resp 18  SpO2 100%  Physical Exam  Nursing note and vitals reviewed. Constitutional: She is oriented to person, place, and time. She appears well-developed and well-nourished.  HENT:  Head: Normocephalic and atraumatic.  Right Ear: Tympanic membrane, external ear and ear canal normal.  Left Ear: Tympanic membrane, external ear and ear canal normal.  Nose: Nose normal.  Mouth/Throat: Uvula is midline, oropharynx is clear and moist and mucous membranes are normal.  Eyes: EOM and lids are normal. Pupils are equal, round, and reactive to light. Right eye exhibits no discharge. Left eye exhibits no discharge. Right eye exhibits no nystagmus. Left eye exhibits no nystagmus.       Conjunctiva pale  Neck: Normal range of motion. Neck supple.  Cardiovascular: Normal rate, regular rhythm and normal heart sounds.   Pulmonary/Chest: Effort normal and breath sounds normal. No respiratory distress. She has no wheezes. She has no rales.  Abdominal: Soft. Bowel sounds are normal. She exhibits no distension. There is no tenderness. There is no rebound and no guarding.  Musculoskeletal:       Cervical back: She exhibits normal range of motion, no tenderness and no bony tenderness.  Neurological: She is alert and oriented to person, place, and time. She has normal strength and normal reflexes. No cranial nerve deficit or sensory deficit. She displays a negative Romberg sign. Coordination and gait normal. GCS eye subscore is 4. GCS verbal subscore is 5. GCS motor subscore is 6.  Skin: Skin is warm and dry.  Psychiatric: She has a normal mood and affect.    ED Course  Procedures (including critical care time)  Labs Reviewed  CBC - Abnormal; Notable for the following:    RBC 2.45 (*)     Hemoglobin 7.5 (*)     HCT 22.9 (*)     All other components within normal limits  BASIC METABOLIC PANEL - Abnormal; Notable for the following:    Glucose, Bld 128 (*)     BUN 43 (*)     Creatinine, Ser 1.35 (*)      GFR calc non Af Amer 35 (*)     GFR calc Af Amer 40 (*)     All other components within normal limits  PROTIME-INR  TYPE AND SCREEN  ABO/RH   No results found.   1. Anemia     Patient seen and examined. Work-up initiated. Previous work-up reviewed.   Vital signs reviewed and are as follows: Filed Vitals:   01/08/12 1603  BP: 114/64  Pulse: 61  Temp: 97.6 F (36.4 C)  Resp: 18   Patient d/w and seen by Dr. Vanita Panda. Will call [facility] to admit for admission re: symptomatic anemia.    MDM  Admit for anemia, down 2.8g/dl in last month        Carlisle Cater, [place] 01/08/12 1617

## 2012-01-09 LAB — CBC
HCT: 27.6 % — ABNORMAL LOW (ref 36.0–46.0)
HCT: 30.6 % — ABNORMAL LOW (ref 36.0–46.0)
Hemoglobin: 9 g/dL — ABNORMAL LOW (ref 12.0–15.0)
Hemoglobin: 9.3 g/dL — ABNORMAL LOW (ref 12.0–15.0)
Hemoglobin: 10.3 g/dL — ABNORMAL LOW (ref 12.0–15.0)
MCH: 30.3 pg (ref 26.0–34.0)
MCHC: 32.6 g/dL (ref 30.0–36.0)
MCHC: 33.7 g/dL (ref 30.0–36.0)
Platelets: 274 10*3/uL (ref 150–400)
RBC: 3.07 MIL/uL — ABNORMAL LOW (ref 3.87–5.11)
RBC: 3.38 MIL/uL — ABNORMAL LOW (ref 3.87–5.11)
RDW: 16.2 % — ABNORMAL HIGH (ref 11.5–15.5)
WBC: 8.8 10*3/uL (ref 4.0–10.5)
WBC: 7.8 10*3/uL (ref 4.0–10.5)

## 2012-01-09 LAB — PREPARE RBC (CROSSMATCH)

## 2012-01-09 MED ORDER — WHITE PETROLATUM GEL
Status: AC
  Filled 2012-01-09: qty 5

## 2012-01-09 NOTE — H&P (Signed)
Family Medicine Teaching Service Attending Note  I interviewed and examined patient [name] and reviewed their tests and x-rays.  I discussed with Dr. Adrian Blackwater and reviewed their note for today.  I agree with their assessment and plan.     Additionally  This am feels much improved.  Able to walk around room without assistance and no chest pain or shortness of breath or lightheadness   Recurrent GI bleeds without identified source Follow hgb today.   If no worsening ok to discharge with follow up for hgb checks and with GI If heavier blood loss consult GI  Continue outpatient iron replacement

## 2012-01-09 NOTE — Progress Notes (Signed)
Family Medicine Teaching Service Daily Progress Note Service Page: [phone]  Patient Assessment: 78 y.o. female with history of anemia with baseline Hgb around 10, MI, paroxysmal atrial fibrillation, HTN, CKD stage III and other chronic medical problems here with shortness of breath and Hgb 7.5 (baseline ~10) here with symptomatic anemia.    Subjective:  Feeling better today but still some lightheadedness when she stands. Her dizziness is improved from admission. Shortness of breath improved. No nausea, vomiting, or diarrhea. Hungry, wants regular diet, states that she watches her calories at home.   Objective: Temp:  [97.6 F (36.4 C)-99.3 F (37.4 C)] 97.8 F (36.6 C) (11/11 0516) Pulse Rate:  [59-81] 62  (11/11 0516) Resp:  [16-20] 20  (11/11 0516) BP: (94-151)/(45-78) 116/45 mmHg (11/11 0516) SpO2:  [98 %-100 %] 98 % (11/11 0516) Weight:  [176 lb 5.9 oz (80 kg)] 176 lb 5.9 oz (80 kg) (11/10 1953) Exam:  Gen: NAD, alert, cooperative with exam HEENT: NCAT, EOMI,  CV: RRR, good S1/S2, no murmur Resp: CTABL, no wheezes, non-labored Abd: SNTND, BS present, no guarding or organomegaly Ext: No edema  Neuro: Alert and oriented, No gross deficits, Stands easily on her own and walks with normal gait. Mentions some lightheadedness while walking.    I have reviewed the patient's medications, labs, imaging, and diagnostic testing.  Notable results are summarized below.  CBC BMET   Lab 01/09/12 0211 01/08/12 1401 01/08/12 1156  WBC 8.8 8.0 --  HGB 9.0* 7.5* 7.5*  HCT 27.6* 22.9* 22.0*  PLT 260 286 --    Lab 01/08/12 1401 01/08/12 1156  NA 140 145  K 4.3 4.5  CL 109 114*  CO2 21 --  BUN 43* 45*  CREATININE 1.35* 1.40*  GLUCOSE 128* 97  CALCIUM 8.5 --     Iron 134 Ferritin 32 Troponin <0.30 Retic 4.8%  Imaging/Diagnostic Tests: CXR 01/08/12 IMPRESSION:  No active disease. No significant change.   Plan: COLINE BUREK is a 78 y.o. female with history of anemia with  baseline Hgb around 10, MI, paroxysmal atrial fibrillation, HTN, CKD stage III and other chronic medical problems here with shortness of breath and Hgb 7.5 (baseline ~10) here with symptomatic anemia.    Shortness of breath - Improved - Most likely due to symptomatic anemia with Hgb 7.5, well below baseline of 10.  - Troponin negative times 1 - 2 units PRBC transfused last night  Acute on chronic bloodloss anemia  - transfused two units overnight, post transfusion CBC 7.5 to 9.0 - Likely due to GI losses for weeks - FOBT + 11/10/103; chronic anemia is iron-deficiency type  - Transfuse one more unit today as she is still symptomatic - Continue home Ferrous sulfate 650mg  daily - Repeat CBC at noon today - Recommend colonoscopy if patient has not had this since last showing in EPIC in 2009, which is recommending follow-up for adenomatous polyps in 10/2010.    Atrial fibrillation - stable  - Metoprolol XL 50mg  daily   HTN - stable  - Continue home metoprolol   CKD Stage III - Stable - Baseline Cre 1.2-1.4 - patient has 1 kidney; h/o transitional cell cancer with removal of other kidney   COPD - stable  - Continue home Dulera 2 puffs BID  - Continue home tiotropium 72mcg daily and Albuterol 2 puffs q6 hours prn   GERD with h/o schatzki's ring - stable  - Protonix 80 mg daily (substitute for home omeprazole)   Sick sinus syndrome -  stable - with pacemaker   Urge incontinence - stable  - Continue home fesoterodine and Oxybutynin 24hr 10 mg daily qhs (substitue for detrol)   HLD - Continue home Simvastatin 40mg  qam   Osteoarthritis - lumbar spine and cervicalgia  - Continue home flexeril prn muscle spasms and tramadol prn pain  FEN/GI: KVO, regular diet per pt request PPx: SCDs  Codes Status: DNR/DNI Dispo: home pending resolution of symptoms  Kenn File, MD 01/09/2012, 8:20 AM

## 2012-01-09 NOTE — Progress Notes (Signed)
FMTS Attending Daily Note:  Annabell Sabal MD  [phone] pager  Family Practice pager:  [phone] I have reviewed this patient and reviewed their chart.  I have discussed with the resident and admitting attending I agree with their findings, assessment and care plan.

## 2012-01-09 NOTE — Clinical Documentation Improvement (Signed)
Anemia Blood Loss Clarification  THIS DOCUMENT IS NOT A PERMANENT PART OF THE MEDICAL RECORD  RESPOND TO THE THIS QUERY, FOLLOW THE INSTRUCTIONS BELOW:  1. If needed, update documentation for the patient's encounter via the notes activity.  2. Access this query again and click edit on the In [employer].  3. After updating, or not, click F2 to complete all highlighted (required) fields concerning your review. Select "additional documentation in the medical record" OR "no additional documentation provided".  4. Click Sign note button.  5. The deficiency will fall out of your In Basket *Please let us know if you are not able to complete this workflow by phone or e-mail (listed below).        01/09/12  Dear [name] [facility]  In an effort to better capture your patient's severity of illness, reflect appropriate length of stay and utilization of resources, a review of the patient medical record has revealed the following indicators.    Based on your clinical judgment, please clarify and document in a progress note and/or discharge summary the clinical condition associated with the following supporting information:  In responding to this query please exercise your independent judgment.  The fact that a query is asked, does not imply that any particular answer is desired or expected.  "Acute on chronic anemia" has been documented. For coding purposes.... if any of the conditions below describe a more specific condition, please document the more specific condition in Progress Notes and Discharge Summary.    Possible Clinical Conditions  Expected Acute Blood Loss Anemia  Acute Blood Loss Anemia  Acute on chronic blood loss anemia  Other Condition  Cannot Clinically Determine  Supporting Information: "Patient reports getting very winded with activity and she says this feels like her symptomatic anemia has in the past" per notes  Signs and Symptoms: "FOBT done  and positive" per notes  Diagnostics:  H&H on admit: Results for JARRAH, PARRISH (MRN [id number]) as of 01/09/2012 15:14  Ref. Range 12/12/2011 16:47 01/08/2012 11:56 01/08/2012 14:01  Hemoglobin Latest Range: 12.0-15.0 g/dL 10.3 (L) 7.5 (L) 7.5 (L)  HCT Latest Range: 36.0-46.0 % 31.6 (L) 22.0 (L) 22.9 (L)   H&H after TX:  Results for NETTIE, MACTAGGART (MRN [id number]) as of 01/09/2012 15:14  Ref. Range 01/09/2012 02:11 01/09/2012 11:01  Hemoglobin Latest Range: 12.0-15.0 g/dL 9.0 (L) 9.3 (L)  HCT Latest Range: 36.0-46.0 % 27.6 (L) 27.8 (L)    Treatments: Blood transfusion-2utsPRBC Ferrous Sulfate 650mg  po daily Cardiac monitoring and monitoring of labs   Reviewed: additional documentation in the medical record  Thank You,  Margarite Gouge RN, BSN Clinical Documentation Improvement Specialist Telephone # [phone]/Pager: [phone] [email or web address]

## 2012-01-10 LAB — TYPE AND SCREEN
ABO/RH(D): A POS
Antibody Screen: NEGATIVE
Unit division: 0

## 2012-01-10 LAB — CBC
HCT: 27.4 % — ABNORMAL LOW (ref 36.0–46.0)
Hemoglobin: 9.3 g/dL — ABNORMAL LOW (ref 12.0–15.0)
MCH: 30.6 pg (ref 26.0–34.0)
MCV: 90.1 fL (ref 78.0–100.0)
RBC: 3.04 MIL/uL — ABNORMAL LOW (ref 3.87–5.11)
WBC: 7.2 10*3/uL (ref 4.0–10.5)

## 2012-01-10 LAB — ABO/RH: ABO/RH(D): A POS

## 2012-01-10 NOTE — Progress Notes (Signed)
FMTS Attending Daily Note:  Annabell Sabal MD  [phone] pager  Family Practice pager:  [phone] I have reviewed this patient and have reviewed their chart. I have discussed this patient with the resident. I agree with the resident's findings, assessment and care plan.

## 2012-01-10 NOTE — Discharge Summary (Signed)
[facility] Discharge Summary  Patient name: [name] Medical record number: [id number] Date of birth: [birth year] Age: 78 y.o. Gender: female Date of Admission: 01/08/2012  Date of Discharge: 01/10/2012 Admitting Physician: Lind Covert, MD  Primary Care Provider: MCDIARMID,TODD D, MD  Indication for Hospitalization: Shortness of breath Discharge Diagnoses:  1. Symptomatic anemia due to Acute on Chronic blood loss anemia 2. Atrial Fibrillation 3. HTN 4.   CKD Stage III 5.   COPD 6.   GERD  7.   Sick sinus syndrome  8.   Urge incontinence  9.   HLD  10. Osteoarthritis   Consultations: None  Significant Labs and Imaging:     01/08/2012 14:01 01/09/2012 02:11 01/09/2012 11:01 01/09/2012 20:30 01/10/2012 05:20 01/10/2012 11:00  Hemoglobin 7.5 (L) 9.0 (L) 9.3 (L) 10.3 (L) 9.3 (L) 9.9 (L)  HCT 22.9 (L) 27.6 (L) 27.8 (L) 30.6 (L) 27.4 (L) 30.2 (L)    Ferritin 32  Troponin <0.30  Retic 4.8%   CXR 01/08/12  IMPRESSION:  No active disease. No significant change.    Procedures: Transfusion of PRBC X 3 units  Brief Hospital Course:   [name] is a 78 y.o. female with history of anemia with baseline Hgb around 10, MI, paroxysmal atrial fibrillation, HTN, CKD stage III and other chronic medical problems here with shortness of breath and Hgb 7.5 (baseline ~10) here with symptomatic anemia.   Acute on Chronic Anemia - Symptomatic Anemia [name] presented to our ED with progressive dyspnea for a month and dizziness for 3 days. She was found to be fecal occult blood positive and to have a Hemoglobin of 7.5. We trasnfused her two units which brought her up to a Hgb of 9.0. She continued to have lightheadedness, dizziness, and some shortness of breath. Based on her symptoms we transfused her 1 more unit which brought her to a Hgb to 10.3. Her hemoglobin was checked twice more and were 9.3 and 9.9. At this time her symptoms were resolved and  she was discharged home with close follow up in our clinic. We feel that her blood loss is likely due to a slow lower GI bleed and that she finally got acutely symptomatic from this slow decline. We also made her a f/u appointment in 1 month with her gastroenterologist to evaluate her for this bleed. She was left on her prior dose of iron sulfate 650 daily. Additionally her Aspirin was held on discharge due to her slow bleed. I would consider re-starting this if her CBC holds steady.   Atrial fibrillation  Throughout her admission her pulse was maintained below 90 bpm on her home dose of metoprolol XL 50 mg.   HTN  Her HTN was stable and managed on her home medications throughout admission.  CKD Stage III -It should be noted that the patient has 1 kidney s/p nephrectomy secondary to transitional cell cancer. Her Creatinine maintained around her baseline of 1.2-1.4 throughout admission.   COPD  She was maintained on her home medications of Dulera, tiotropium, and PRN albuterol throughout admission.   GERD  Treated with Protonix 80 mg daily which was substituted for her home dose of omeprazole.   Sick sinus syndrome  She had a HR WNL throughout admission, she is s/p pacemaker placement.  Urge incontinence  Managed with her home dose of fesoterodine and Oxybutynin 24hr 10 mg daily qhs (substitued for detrol).   HLD  Her home dose of Simvastatin was continued.  Osteoarthritis  Managed without issue with home flexeril prn for muscle spasms and tramadol prn for pain.    Discharge Medications:    Medication List     As of 01/10/2012  1:51 PM    STOP taking these medications         aspirin 325 MG tablet      TAKE these medications         ADVAIR DISKUS 250-50 MCG/DOSE Aepb   Generic drug: Fluticasone-Salmeterol   Inhale 1 puff into the lungs 2 (two) times daily.      albuterol 108 (90 BASE) MCG/ACT inhaler   Commonly known as: PROVENTIL HFA;VENTOLIN HFA   Inhale 2 puffs  into the lungs every 6 (six) hours as needed. If needed for shortness of breath.      azithromycin 250 MG tablet   Commonly known as: ZITHROMAX   Take two tablets by mouth on first day then one tablet by mouth daily for 4 additional days.      cyclobenzaprine 5 MG tablet   Commonly known as: FLEXERIL   Take 1 tablet (5 mg total) by mouth 3 (three) times daily as needed.      ferrous sulfate 325 (65 FE) MG tablet   Take 650 mg by mouth daily. One tablet once daily      furosemide 40 MG tablet   Commonly known as: LASIX   Take 1 tablet (40 mg total) by mouth daily as needed (leg swelling).      metoprolol succinate 50 MG 24 hr tablet   Commonly known as: TOPROL-XL   Take 1 tablet (50 mg total) by mouth daily.      multivitamin tablet   Take 1 tablet by mouth daily.      nitroGLYCERIN 0.4 MG SL tablet   Commonly known as: NITROSTAT   Place 1 tablet (0.4 mg total) under the tongue every 5 (five) minutes as needed.      omeprazole 40 MG capsule   Commonly known as: PRILOSEC   Take 1 capsule (40 mg total) by mouth daily.      simvastatin 40 MG tablet   Commonly known as: ZOCOR   Take 40 mg by mouth every morning.      tiotropium 18 MCG inhalation capsule   Commonly known as: SPIRIVA   Place 1 capsule (18 mcg total) into inhaler and inhale daily.      tolterodine 2 MG tablet   Commonly known as: DETROL   Take 1 tablet (2 mg total) by mouth 2 (two) times daily.      TOVIAZ 8 MG Tb24   Generic drug: fesoterodine   Take 8 mg by mouth daily. Rx started by Dr Bjorn Loser [facility])      traMADol 50 MG tablet   Commonly known as: ULTRAM   Take 1 tablet (50 mg total) by mouth every 6 (six) hours as needed for pain. 1-2 tablets four times a day as needed for pain.      vitamin B-12 1000 MCG tablet   Commonly known as: CYANOCOBALAMIN   Take 1,000 mcg by mouth daily.      Walker Misc   1 each by Does not apply route daily. Folding Walker with wheels and walker  seat attachement       Issues for Follow Up:  - Conisder f/u CBC as she was symptomatically anemic on admission - Consider Colonoscopy as you deem appropriate, Thanks to Dr. Cristina Gong for reccomendations  Outstanding Results:  None  Discharge Instructions: Please refer to Patient Instructions section of EMR for full details.  Patient was counseled important signs and symptoms that should prompt return to medical care, changes in medications, dietary instructions, activity restrictions, and follow up appointments.       Follow-up Information    Follow up with Kenn File, MD. On 01/10/2012. (Scheduled at 2:00)    Contact information:   [place] [address] [phone]       Follow up with Cleotis Nipper, MD. On 02/08/2012. (at 9:30)    Contact information:   [address] [place], [place] [address] [id number]          Discharge Condition: Margret Chance, MD 01/10/2012, 1:51 PM

## 2012-01-10 NOTE — Progress Notes (Signed)
Family Medicine Teaching Service Daily Progress Note Service Page: [phone]  Patient Assessment: 78 y.o. female with history of anemia with baseline Hgb around 10, MI, paroxysmal atrial fibrillation, HTN, CKD stage III and other chronic medical problems here with shortness of breath and Hgb 7.5 (baseline ~10) here with symptomatic anemia.    Subjective:  Feeling better today stating that her dizziness and shortness of breath have improved a lot. No hematuria, nausea/vomiting, or bloody stools. States she has black stools at baseline she attruibutes to oral iron intake.   Objective: Temp:  [97.6 F (36.4 C)-98.2 F (36.8 C)] 97.6 F (36.4 C) (11/12 0621) Pulse Rate:  [60-85] 60  (11/12 0621) Resp:  [16-20] 16  (11/12 0621) BP: (104-143)/(50-77) 116/55 mmHg (11/12 0621) SpO2:  [93 %-100 %] 99 % (11/12 [id number]) Exam:  Gen: NAD, alert, cooperative with exam HEENT: NCAT, EOMI,  CV: RRR, good S1/S2, no murmur Resp: CTABL, no wheezes, non-labored Abd: SNTND, BS present, no guarding or organomegaly Ext: No edema  Neuro: Alert and oriented, No gross deficits  I have reviewed the patient's medications, labs, imaging, and diagnostic testing.  Notable results are summarized below.  CBC BMET   Lab 01/10/12 0520 01/09/12 2030 01/09/12 1101  WBC 7.2 8.3 7.8  HGB 9.3* 10.3* 9.3*  HCT 27.4* 30.6* 27.8*  PLT 265 278 274    Lab 01/08/12 1401 01/08/12 1156  NA 140 145  K 4.3 4.5  CL 109 114*  CO2 21 --  BUN 43* 45*  CREATININE 1.35* 1.40*  GLUCOSE 128* 97  CALCIUM 8.5 --     Iron 134 Ferritin 32 Troponin <0.30 Retic 4.8%  Imaging/Diagnostic Tests: CXR 01/08/12 IMPRESSION:  No active disease. No significant change.   Plan: RENAI RHYNER is a 78 y.o. female with history of anemia with baseline Hgb around 10, MI, paroxysmal atrial fibrillation, HTN, CKD stage III and other chronic medical problems here with shortness of breath and Hgb 7.5 (baseline ~10) here with symptomatic anemia.      Shortness of breath - resolved - Most likely due to symptomatic anemia with Hgb 7.5, well below baseline of 10.  - Troponin negative times 1 - 3 units PRBC transfused total  Acute on chronic bloodloss anemia  - transfused one more unit on 11/11, for a total of three,  post transfusion CBC 9.3 to 10.3, down to 9.3 this am.  - Will repeat H/H to r/o lab error.  - Asymptomatic today - Likely due to GI losses for weeks - FOBT + 11/10/103; chronic anemia is iron-deficiency type  - Continue home Ferrous sulfate 650mg  daily - Per patient has had a recent C-scaope in the last year and a half, will obtain OP appt for her.    Atrial fibrillation - stable  - Metoprolol XL 50mg  daily   HTN - stable  - Continue home metoprolol   CKD Stage III - Stable - Baseline Cre 1.2-1.4 - patient has 1 kidney; h/o transitional cell cancer with removal of other kidney   COPD - stable  - Continue home Dulera 2 puffs BID  - Continue home tiotropium 86mcg daily and Albuterol 2 puffs q6 hours prn   GERD with h/o schatzki's ring - stable  - Protonix 80 mg daily (substitute for home omeprazole)   Sick sinus syndrome - stable - with pacemaker   Urge incontinence - stable  - Continue home fesoterodine and Oxybutynin 24hr 10 mg daily qhs (substitue for detrol)   HLD - Continue  home Simvastatin 40mg  qam   Osteoarthritis - lumbar spine and cervicalgia  - Continue home flexeril prn muscle spasms and tramadol prn pain  FEN/GI: KVO, regular diet per pt request PPx: SCDs  Codes Status: DNR/DNI Dispo: home pending no active bleed and maintains asymptomatic.   Kenn File, MD 01/10/2012, 8:17 AM

## 2012-01-10 NOTE — Discharge Summary (Signed)
Family Medicine Teaching Service  Discharge Note : Attending Annabell Sabal MD Pager [phone] Inpatient Team Pager:  [phone]  I have reviewed this patient, reviewed their chart and discussed discharge planning with the resident at the time of discharge. I agree with the discharge plan as above.

## 2012-01-12 NOTE — Progress Notes (Signed)
UR Completed.  Vergie Living [id number] 01/12/2012

## 2012-01-16 ENCOUNTER — Inpatient Hospital Stay: Payer: Medicare Other | Admitting: Family Medicine

## 2012-01-19 DIAGNOSIS — H353190 Nonexudative age-related macular degeneration, unspecified eye, stage unspecified: Secondary | ICD-10-CM | POA: Diagnosis not present

## 2012-01-19 DIAGNOSIS — H353290 Exudative age-related macular degeneration, unspecified eye, stage unspecified: Secondary | ICD-10-CM | POA: Diagnosis not present

## 2012-01-23 ENCOUNTER — Ambulatory Visit (INDEPENDENT_AMBULATORY_CARE_PROVIDER_SITE_OTHER): Payer: Medicare Other | Admitting: Family Medicine

## 2012-01-23 ENCOUNTER — Encounter: Payer: Self-pay | Admitting: Family Medicine

## 2012-01-23 VITALS — BP 146/81 | HR 96 | Temp 98.3°F | Ht 64.0 in | Wt 181.0 lb

## 2012-01-23 DIAGNOSIS — D509 Iron deficiency anemia, unspecified: Secondary | ICD-10-CM | POA: Diagnosis not present

## 2012-01-23 LAB — CBC
HCT: 33.4 % — ABNORMAL LOW (ref 36.0–46.0)
Hemoglobin: 10.7 g/dL — ABNORMAL LOW (ref 12.0–15.0)
MCH: 30.4 pg (ref 26.0–34.0)
MCV: 94.9 fL (ref 78.0–100.0)
Platelets: 259 10*3/uL (ref 150–400)
RBC: 3.52 MIL/uL — ABNORMAL LOW (ref 3.87–5.11)
WBC: 6.5 10*3/uL (ref 4.0–10.5)

## 2012-01-23 NOTE — Patient Instructions (Addendum)
I will call you if your tests are not good.  Otherwise I will send you a letter.  If you do not hear from me with in 2 weeks please call our office.     Ask Dr Cristina Gong about any further testing - could this be other than intestinal blood loss

## 2012-01-23 NOTE — Assessment & Plan Note (Addendum)
Recent hospitalization for GI bleed.  Other than fatigue seems to be doing well.  Will check ferritin and cbc.  May want to restart asa for her AF is doing well.

## 2012-01-23 NOTE — Progress Notes (Signed)
  Subjective:    Patient ID: [name], female    DOB: 1924-01-30, 78 y.o.   MRN: [id number]  HPI  Hospital Follow up for Blood Loss Feels nonspecifically tired frequently.  No lightheadness or chest pain or near syncope.  Has not noticed any bleeding.  Taking iron 625 daily She wonders if she needs to see a hematologist - concerned her symptoms are similar to a friends relative who had cold agglutinins.   Scheduled to see Dr Cristina Gong in a few days    Review of Systems     Objective:   Physical Exam  Alert no acute distress Able to walk around room and down hall briskly without symptoms      Assessment & Plan:

## 2012-01-24 ENCOUNTER — Encounter: Payer: Self-pay | Admitting: Family Medicine

## 2012-02-08 DIAGNOSIS — R195 Other fecal abnormalities: Secondary | ICD-10-CM | POA: Diagnosis not present

## 2012-02-08 DIAGNOSIS — Z01419 Encounter for gynecological examination (general) (routine) without abnormal findings: Secondary | ICD-10-CM | POA: Diagnosis not present

## 2012-02-08 DIAGNOSIS — D649 Anemia, unspecified: Secondary | ICD-10-CM | POA: Diagnosis not present

## 2012-02-09 DIAGNOSIS — H2510 Age-related nuclear cataract, unspecified eye: Secondary | ICD-10-CM | POA: Diagnosis not present

## 2012-02-09 DIAGNOSIS — H2589 Other age-related cataract: Secondary | ICD-10-CM | POA: Diagnosis not present

## 2012-02-13 ENCOUNTER — Telehealth: Payer: Self-pay | Admitting: Family Medicine

## 2012-02-13 NOTE — Telephone Encounter (Signed)
Will forward to Dr. McDiarmid for permission. Breeanne Oblinger, Salome Spotted

## 2012-02-13 NOTE — Telephone Encounter (Signed)
Pt is asking to be seen sooner than 1/6 - Dr Billee Cashing is here on 12/23 and wants to know if she can be double booked at 4:15 - pls advise

## 2012-03-05 ENCOUNTER — Ambulatory Visit (INDEPENDENT_AMBULATORY_CARE_PROVIDER_SITE_OTHER): Payer: Medicare Other | Admitting: Family Medicine

## 2012-03-05 ENCOUNTER — Ambulatory Visit: Payer: Medicare Other | Admitting: Family Medicine

## 2012-03-05 ENCOUNTER — Other Ambulatory Visit: Payer: Self-pay | Admitting: Family Medicine

## 2012-03-05 VITALS — BP 125/73 | HR 105 | Temp 98.1°F | Wt 181.0 lb

## 2012-03-05 DIAGNOSIS — J449 Chronic obstructive pulmonary disease, unspecified: Secondary | ICD-10-CM

## 2012-03-05 DIAGNOSIS — M542 Cervicalgia: Secondary | ICD-10-CM

## 2012-03-05 DIAGNOSIS — I10 Essential (primary) hypertension: Secondary | ICD-10-CM | POA: Diagnosis not present

## 2012-03-05 DIAGNOSIS — D509 Iron deficiency anemia, unspecified: Secondary | ICD-10-CM | POA: Diagnosis not present

## 2012-03-05 DIAGNOSIS — K222 Esophageal obstruction: Secondary | ICD-10-CM | POA: Diagnosis not present

## 2012-03-05 DIAGNOSIS — D649 Anemia, unspecified: Secondary | ICD-10-CM

## 2012-03-05 DIAGNOSIS — R195 Other fecal abnormalities: Secondary | ICD-10-CM | POA: Diagnosis not present

## 2012-03-05 DIAGNOSIS — Z79899 Other long term (current) drug therapy: Secondary | ICD-10-CM

## 2012-03-05 HISTORY — DX: Other long term (current) drug therapy: Z79.899

## 2012-03-05 LAB — COMPREHENSIVE METABOLIC PANEL
BUN: 24 mg/dL — ABNORMAL HIGH (ref 6–23)
CO2: 28 mEq/L (ref 19–32)
Glucose, Bld: 66 mg/dL — ABNORMAL LOW (ref 70–99)
Sodium: 143 mEq/L (ref 135–145)
Total Bilirubin: 0.3 mg/dL (ref 0.3–1.2)
Total Protein: 6.2 g/dL (ref 6.0–8.3)

## 2012-03-05 LAB — CBC
HCT: 31.9 % — ABNORMAL LOW (ref 36.0–46.0)
Hemoglobin: 10.3 g/dL — ABNORMAL LOW (ref 12.0–15.0)
MCH: 30.7 pg (ref 26.0–34.0)
MCHC: 32.3 g/dL (ref 30.0–36.0)
MCV: 94.9 fL (ref 78.0–100.0)
RBC: 3.36 MIL/uL — ABNORMAL LOW (ref 3.87–5.11)

## 2012-03-05 LAB — FERRITIN: Ferritin: 45 ng/mL (ref 10–291)

## 2012-03-05 MED ORDER — LIDOCAINE 5 % EX PTCH: 1.0000 | MEDICATED_PATCH | CUTANEOUS | Status: DC

## 2012-03-06 ENCOUNTER — Telehealth: Payer: Self-pay | Admitting: *Deleted

## 2012-03-06 HISTORY — PX: ESOPHAGOGASTRODUODENOSCOPY ENDOSCOPY: SHX5814

## 2012-03-06 NOTE — Telephone Encounter (Signed)
PA required for Lidocaine  5% patch. MD  Completed form and faxed to insurance.

## 2012-03-06 NOTE — Telephone Encounter (Signed)
Approval received from insurance. Pharmacy notified. However he is unable to get it to go thru. Will wait a bit and then try again.  Approval notice from insurance company is on desk in [facility] office.  Pharmacist will call back if there continues to be a problem.   Notice states approval is good until 06/04/2012

## 2012-03-07 ENCOUNTER — Other Ambulatory Visit: Payer: Self-pay | Admitting: Family Medicine

## 2012-03-07 ENCOUNTER — Encounter: Payer: Self-pay | Admitting: Family Medicine

## 2012-03-07 ENCOUNTER — Telehealth: Payer: Self-pay | Admitting: Family Medicine

## 2012-03-07 DIAGNOSIS — D509 Iron deficiency anemia, unspecified: Secondary | ICD-10-CM

## 2012-03-07 NOTE — Progress Notes (Signed)
  Subjective:    Patient ID: [name], female    DOB: [birth year], 79 y.o.   MRN: [id number]  HPI  Iron Deficiency Anemia Feeling well. No overt melena or BRBPR. Continues to have shortness of breath while climbing flight of stairs at home. Taking Iron tablet, one three times a day. (+) constipation.  No nausea/vomiting. Had EGD today by DR Buccini. By pt report, no significant findings.  COPD DOE as above.  Pt concerned that this symptom could be from the coronary stenosis that was unable to be treated with PCI in past.   No chest pain.  No increase in cough or sputum production Tolerating the Advair and Spiriva.  Uses albuterol 2 puffs twice a day regularly.  No recent exacerbations.  No return to smoking.  Anticipates seeing her Pulmonologist, Dr Casper Harrison, at [facility] in the next few months.   Persistent Intermittent Neck pain Onset: several months ago, after an acute onset of transient neck pain requiring Chiropractic care Location: right posterior neck and trapezius area Quality: burning, stinging Function:  interferes with sleep Pattern: intermittent, 2 to 3 times a week. Course: stable Radiation: no radiation into arms or hands. Relief: none with tramadol nor flexeril Associated Symptoms: No weakness in arms or hands Trauma (Acute or Chronic): No recalled trauma Relevant PMH/PSH: History of Shingles attack.       Review of Systems See HPI     Objective:   Physical Exam  Constitutional: Vital signs are normal. No distress.  Neck:    Neurological: She is alert.  Reflex Scores:      Tricep reflexes are 1+ on the right side and 1+ on the left side.      Bicep reflexes are 1+ on the right side and 1+ on the left side.         Assessment & Plan:

## 2012-03-07 NOTE — Assessment & Plan Note (Addendum)
Pt with dyspnea on exertion with climbing stairs to her bedroom. Pt is concerned that this DOE could be from her Circumflex Obtuse marginal vessel that had a 90% stenosis that may have caused NSTEMI (Type II) when she had AFIB with RVR several years ago.  She would like to reconsult Dr Daneen Schick (Card) to see if he thinks this explanatory scenario may be the case.  Will arrange consultation.

## 2012-03-07 NOTE — Patient Instructions (Addendum)
Will call with lab results Will set up consult with cardiology Start trial of Lidoderm patch to neck pain

## 2012-03-07 NOTE — Assessment & Plan Note (Signed)
Pt with burning, stinging pain in right posterior neck and trapezius region that interferes with sleep. Neuropathic characteristics without symptoms and signs with radiculopathy or myelopathy. Pt with history of herpes zosters  Will try trial of Lidoderm patch for symptomatic relief of discomfort.  Pt may have refills of patch if she finds it benefitital.

## 2012-03-07 NOTE — Assessment & Plan Note (Signed)
Lab Results  Component Value Date   WBC 6.5 03/05/2012   HGB 10.3* 03/05/2012   HCT 31.9* 03/05/2012   MCV 94.9 03/05/2012   PLT 296 03/05/2012   Lab Results  Component Value Date   FERRITIN 45 03/05/2012  No finding on EGD for cause of chronic blood loss Adequate Iron body store replacement. Will decrease Iron supplementation to one tablet twice a day from three times a day to see if constipation can be lessened.  Pt to have lab visit in one month to monitor hemoglobin and ferritin

## 2012-03-07 NOTE — Telephone Encounter (Signed)
Informed Mrs [name] of her stable hemoglobin and iron stores per ferritin level.  Recommended that patient decrease iron supplement to one tablet twice a day from three times a day.  Pt to come in for lab visit for monitoring CBC and ferritin in one month.     Will likely begin periodic scheduled lab monitoring of Hgb and ferritin to avoid precipitous hospital admission for severe anemia.

## 2012-03-12 DIAGNOSIS — Z950 Presence of cardiac pacemaker: Secondary | ICD-10-CM | POA: Diagnosis not present

## 2012-03-22 ENCOUNTER — Other Ambulatory Visit: Payer: Self-pay | Admitting: *Deleted

## 2012-03-22 MED ORDER — SIMVASTATIN 40 MG PO TABS: 40.0000 mg | ORAL_TABLET | Freq: Every morning | ORAL | Status: DC

## 2012-03-26 DIAGNOSIS — R0609 Other forms of dyspnea: Secondary | ICD-10-CM | POA: Diagnosis not present

## 2012-03-26 DIAGNOSIS — R195 Other fecal abnormalities: Secondary | ICD-10-CM | POA: Diagnosis not present

## 2012-03-26 DIAGNOSIS — I4891 Unspecified atrial fibrillation: Secondary | ICD-10-CM | POA: Diagnosis not present

## 2012-03-26 DIAGNOSIS — I10 Essential (primary) hypertension: Secondary | ICD-10-CM | POA: Diagnosis not present

## 2012-03-26 DIAGNOSIS — D509 Iron deficiency anemia, unspecified: Secondary | ICD-10-CM | POA: Diagnosis not present

## 2012-03-26 DIAGNOSIS — I495 Sick sinus syndrome: Secondary | ICD-10-CM | POA: Diagnosis not present

## 2012-03-26 DIAGNOSIS — I2510 Atherosclerotic heart disease of native coronary artery without angina pectoris: Secondary | ICD-10-CM | POA: Diagnosis not present

## 2012-03-30 DIAGNOSIS — D500 Iron deficiency anemia secondary to blood loss (chronic): Secondary | ICD-10-CM | POA: Diagnosis not present

## 2012-03-30 DIAGNOSIS — R195 Other fecal abnormalities: Secondary | ICD-10-CM | POA: Diagnosis not present

## 2012-04-03 DIAGNOSIS — R0609 Other forms of dyspnea: Secondary | ICD-10-CM | POA: Diagnosis not present

## 2012-04-03 DIAGNOSIS — I2510 Atherosclerotic heart disease of native coronary artery without angina pectoris: Secondary | ICD-10-CM | POA: Diagnosis not present

## 2012-04-03 DIAGNOSIS — I4891 Unspecified atrial fibrillation: Secondary | ICD-10-CM | POA: Diagnosis not present

## 2012-04-05 ENCOUNTER — Other Ambulatory Visit: Payer: Medicare Other

## 2012-04-05 DIAGNOSIS — D509 Iron deficiency anemia, unspecified: Secondary | ICD-10-CM

## 2012-04-05 LAB — CBC
HCT: 30.3 % — ABNORMAL LOW (ref 36.0–46.0)
Hemoglobin: 9.9 g/dL — ABNORMAL LOW (ref 12.0–15.0)
MCH: 30.7 pg (ref 26.0–34.0)
MCHC: 32.7 g/dL (ref 30.0–36.0)

## 2012-04-05 NOTE — Progress Notes (Signed)
CBC AND FERRITIN DONE TODAY Oree Hislop 

## 2012-04-10 DIAGNOSIS — J984 Other disorders of lung: Secondary | ICD-10-CM | POA: Diagnosis not present

## 2012-04-10 DIAGNOSIS — F17200 Nicotine dependence, unspecified, uncomplicated: Secondary | ICD-10-CM | POA: Diagnosis not present

## 2012-04-10 DIAGNOSIS — J449 Chronic obstructive pulmonary disease, unspecified: Secondary | ICD-10-CM | POA: Diagnosis not present

## 2012-04-10 DIAGNOSIS — K922 Gastrointestinal hemorrhage, unspecified: Secondary | ICD-10-CM | POA: Diagnosis not present

## 2012-04-10 DIAGNOSIS — D638 Anemia in other chronic diseases classified elsewhere: Secondary | ICD-10-CM | POA: Diagnosis not present

## 2012-04-10 DIAGNOSIS — Z7982 Long term (current) use of aspirin: Secondary | ICD-10-CM | POA: Diagnosis not present

## 2012-04-10 DIAGNOSIS — D500 Iron deficiency anemia secondary to blood loss (chronic): Secondary | ICD-10-CM | POA: Diagnosis not present

## 2012-04-10 DIAGNOSIS — Z79899 Other long term (current) drug therapy: Secondary | ICD-10-CM | POA: Diagnosis not present

## 2012-04-11 DIAGNOSIS — I495 Sick sinus syndrome: Secondary | ICD-10-CM | POA: Diagnosis not present

## 2012-04-11 DIAGNOSIS — Z950 Presence of cardiac pacemaker: Secondary | ICD-10-CM | POA: Diagnosis not present

## 2012-05-07 DIAGNOSIS — I495 Sick sinus syndrome: Secondary | ICD-10-CM | POA: Diagnosis not present

## 2012-05-07 DIAGNOSIS — Z950 Presence of cardiac pacemaker: Secondary | ICD-10-CM | POA: Diagnosis not present

## 2012-05-07 DIAGNOSIS — I4891 Unspecified atrial fibrillation: Secondary | ICD-10-CM | POA: Diagnosis not present

## 2012-05-21 ENCOUNTER — Other Ambulatory Visit: Payer: Medicare Other

## 2012-05-21 DIAGNOSIS — D509 Iron deficiency anemia, unspecified: Secondary | ICD-10-CM

## 2012-05-21 LAB — CBC
HCT: 37.2 % (ref 36.0–46.0)
MCV: 89.9 fL (ref 78.0–100.0)
RDW: 13.1 % (ref 11.5–15.5)
WBC: 5.8 10*3/uL (ref 4.0–10.5)

## 2012-05-21 NOTE — Progress Notes (Signed)
CBC AND FERRITIN DONE TODAY Sandip Power 

## 2012-05-23 ENCOUNTER — Telehealth: Payer: Self-pay | Admitting: Family Medicine

## 2012-05-23 NOTE — Telephone Encounter (Signed)
Patient is calling for the results of her blood work.

## 2012-05-24 NOTE — Telephone Encounter (Signed)
Please let her know they are all normal.  She should continue the iron for another month then can stop  Follow up with Dr Raymondo Band as scheduled  Thanks [place]

## 2012-05-25 NOTE — Telephone Encounter (Signed)
Pt informed and agreeable. [place], Jessica Dawn  

## 2012-05-27 ENCOUNTER — Other Ambulatory Visit: Payer: Self-pay | Admitting: Family Medicine

## 2012-05-29 ENCOUNTER — Ambulatory Visit: Payer: Medicare Other | Admitting: Family Medicine

## 2012-05-29 ENCOUNTER — Encounter: Payer: Self-pay | Admitting: Family Medicine

## 2012-05-29 ENCOUNTER — Ambulatory Visit (INDEPENDENT_AMBULATORY_CARE_PROVIDER_SITE_OTHER): Payer: Medicare Other | Admitting: Family Medicine

## 2012-05-29 VITALS — BP 147/78 | HR 66 | Ht 64.0 in | Wt 180.0 lb

## 2012-05-29 DIAGNOSIS — R252 Cramp and spasm: Secondary | ICD-10-CM

## 2012-05-29 DIAGNOSIS — I495 Sick sinus syndrome: Secondary | ICD-10-CM | POA: Diagnosis not present

## 2012-05-29 DIAGNOSIS — D509 Iron deficiency anemia, unspecified: Secondary | ICD-10-CM | POA: Diagnosis not present

## 2012-05-29 DIAGNOSIS — Z950 Presence of cardiac pacemaker: Secondary | ICD-10-CM | POA: Diagnosis not present

## 2012-05-29 HISTORY — DX: Cramp and spasm: R25.2

## 2012-05-29 LAB — CBC
MCH: 29.2 pg (ref 26.0–34.0)
MCHC: 32.2 g/dL (ref 30.0–36.0)
MCV: 90.6 fL (ref 78.0–100.0)
Platelets: 235 10*3/uL (ref 150–400)
RDW: 13.1 % (ref 11.5–15.5)
WBC: 5.9 10*3/uL (ref 4.0–10.5)

## 2012-05-29 NOTE — Patient Instructions (Addendum)
It was a pleasure to see you today.   I am ordering a thyroid test and a repeat of your blood count to look for causes of your leg cramps.  I will contact you with the results when they come back.  I advise that you continue to take your iron as you are doing.  I defer further workup to Dr. McDiarmid when you see him on April 17th.

## 2012-05-30 NOTE — Assessment & Plan Note (Signed)
Leg cramps noted about one time a week; she notes they get better with walking.  She has had iron-deficiency anemia which may predispose to leg cramps.  Will check for thyroid disease, which can be a precipitant.  She asks for a repeat CBC today, despite the favorable one done a week ago.  She is asked to chronicle/journal the dates and onset of leg cramps for her upcoming visit with Dr McDIarmid on April 17th.  May also consider possible diagnosis of RLS if testing negative.

## 2012-05-30 NOTE — Progress Notes (Signed)
  Subjective:    Patient ID: [name], female    DOB: May 25, 1923, 79 y.o.   MRN: [id number]  HPI Patient seen today as an acute visit, for leg cramps that occur in both legs at night, frequency of about one time a week.  Improves after awhile (with walking), but is very bothersome to her when she has them.  No swelling in legs.    Patient with many questions about whether she needs to continue with her iron supplementation, now that her most recent Hgb has come up to 12.  She has been evaluated by GI and has had colonoscopy (x2) and EGD, without identification of a source of bleeding.  She reports that she had a single episode of "gushing blood" epistaxis 1-1/2 weeks ago which stopped promptly and has not recurred. No other sites of bleeding.  Sees blotchy spots on arms, forehead, began to notice these in the time since starting her iron supplements.  Review of Systems No bleeding from gums, nose, rectum, no easy bruising.  No dyspnea or chest pain. No cough, no fevers or chills.  Legs not painful or swollen now.      Objective:   Physical Exam Well appearing, no apparent distress HEENT Neck supple. No thyroid nodules or tenderness, no goiter.  COR Regular S1S2 PULM Clear bilat LEs: No calf tenderness or disparate girth. No edema.  Sensation grossly intact. Palpable dp pulses bilat SKIN: Hyperpigmented macules which appear like solar lentigines across scalp/forehead, dorsum of arms.  No focal nevi noted on exam.        Assessment & Plan:

## 2012-05-30 NOTE — Assessment & Plan Note (Signed)
Improvement in Hgb, will recommend continuation of iron supplement as she has been taking it.

## 2012-06-09 ENCOUNTER — Other Ambulatory Visit: Payer: Self-pay | Admitting: Family Medicine

## 2012-06-14 ENCOUNTER — Ambulatory Visit: Payer: Medicare Other | Admitting: Family Medicine

## 2012-06-14 ENCOUNTER — Encounter: Payer: Self-pay | Admitting: Family Medicine

## 2012-06-14 ENCOUNTER — Ambulatory Visit (INDEPENDENT_AMBULATORY_CARE_PROVIDER_SITE_OTHER): Payer: Medicare Other | Admitting: Family Medicine

## 2012-06-14 VITALS — BP 158/87 | HR 94 | Temp 98.3°F | Ht 64.0 in | Wt 176.0 lb

## 2012-06-14 DIAGNOSIS — H353290 Exudative age-related macular degeneration, unspecified eye, stage unspecified: Secondary | ICD-10-CM | POA: Diagnosis not present

## 2012-06-14 DIAGNOSIS — L814 Other melanin hyperpigmentation: Secondary | ICD-10-CM

## 2012-06-14 DIAGNOSIS — J449 Chronic obstructive pulmonary disease, unspecified: Secondary | ICD-10-CM

## 2012-06-14 DIAGNOSIS — I10 Essential (primary) hypertension: Secondary | ICD-10-CM | POA: Diagnosis not present

## 2012-06-14 DIAGNOSIS — D509 Iron deficiency anemia, unspecified: Secondary | ICD-10-CM

## 2012-06-14 DIAGNOSIS — I495 Sick sinus syndrome: Secondary | ICD-10-CM

## 2012-06-14 DIAGNOSIS — J4489 Other specified chronic obstructive pulmonary disease: Secondary | ICD-10-CM

## 2012-06-14 DIAGNOSIS — L819 Disorder of pigmentation, unspecified: Secondary | ICD-10-CM

## 2012-06-14 MED ORDER — LORAZEPAM 0.5 MG PO TABS: 0.5000 mg | ORAL_TABLET | Freq: Three times a day (TID) | ORAL | Status: DC | PRN

## 2012-06-14 MED ORDER — CYCLOBENZAPRINE HCL 5 MG PO TABS: 5.0000 mg | ORAL_TABLET | Freq: Three times a day (TID) | ORAL | Status: DC | PRN

## 2012-06-15 DIAGNOSIS — L814 Other melanin hyperpigmentation: Secondary | ICD-10-CM | POA: Insufficient documentation

## 2012-06-15 HISTORY — DX: Other melanin hyperpigmentation: L81.4

## 2012-06-15 MED ORDER — TRETINOIN 0.025 % EX CREA: TOPICAL_CREAM | Freq: Every day | CUTANEOUS | Status: DC

## 2012-06-15 NOTE — Patient Instructions (Signed)
Apply Tretinoin cream pea-sized amount to skin of face and to forearms each evening.  It will take 4 to 5 months of consistent use todecrease skin mottling.  Will recheck BP on next visit to see if need to start andeother antihypertensive medication.

## 2012-06-15 NOTE — Assessment & Plan Note (Signed)
Lab Results  Component Value Date   WBC 5.9 05/29/2012   HGB 12.1 05/29/2012   HCT 37.6 05/29/2012   MCV 90.6 05/29/2012   PLT 235 05/29/2012   Stable Hemoglobin on ferrous sulfate 325 mg twice daily. Pt with UGI discomfort with medication.  Plan: reduce ferrous sulfate to one tablet daily.  Recheck Hgb in 2-3 months at next OFFICE VISIT

## 2012-06-15 NOTE — Assessment & Plan Note (Signed)
New issue for patient.  Cosmetic issue only. Advised protection from UVA and UVB light with hats, clothing and sun screen/blocks. Once patient returns from [place] this month, she may start a low potency topical tretinoin cream 0.025%. Pea-sized amount of cream applied to face and to both forearms in evening with goal of evening out mottling of skin without signifcant skin irritation.  4-5 months to seeing benefits are usual.

## 2012-06-15 NOTE — Progress Notes (Signed)
  Subjective:    Patient ID: [name], female    DOB: [birth year], 79 y.o.   MRN: [id number]  HPI  Subjective:    Patient ID: [name], female    DOB: [birth year], 79 y.o.   MRN: [id number]  HPI  Iron Deficiency Anemia Feeling well. No overt melena or BRBPR. Continues to have shortness of breath while climbing flight of stairs at home. Taking Iron tablet, one two times a day. (+) constipation.  No nausea/vomiting.   COPD DOE stable.  Has to rest briefly on stairway when going to second floor. .   No chest pain.  No increase in cough or sputum production Tolerating the Advair and Spiriva.  Uses albuterol 2 puffs twice a day regularly.  No recent exacerbations.  No return to smoking.  Using Ativan every other day for episodes of sensations of shortness of breath.   Skin spots Tan to brown spots on skin on forearms and foreheads No itching Present for years but becoming more prominent in recent months. Cosmetically distressing to patient        Review of Systems See HPI     Objective:   Physical Exam  Constitutional: Vital signs are normal. No distress. groomed. cooperative Lung: distant, no wheeze, no crackles.  No accessory mm use. Cor: RRR, No M/G; No JVD. Trace pretibial edema pitting bilaterally Skin: flat tan to light brown macules distributed over forehaead and foreamrs.  No blushing.  No rough surface or scalling in patient with solar skin damage in surronding skin     Assessment & Plan:     Review of Systems  Constitutional: Negative for activity change and fatigue.  Respiratory: Negative for cough.   Cardiovascular: Negative for leg swelling.  Musculoskeletal: Positive for back pain.       Objective:   Physical Exam        Assessment & Plan:

## 2012-06-15 NOTE — Assessment & Plan Note (Signed)
Labile BP measurements. Hesitant to change antihypertensive medications based on single office visit measurement. Will recheck on next office visit. Would likely add Amlodipine if needed.

## 2012-06-15 NOTE — Assessment & Plan Note (Signed)
Stable. Tolerating; Spriva and Advair daily and as needed Albuterol. Continue current regiment.  Refill ativan for as needed use for episodic anxiety with SHORTNESS OF BREATH symptoms

## 2012-06-25 ENCOUNTER — Telehealth: Payer: Self-pay | Admitting: *Deleted

## 2012-06-25 DIAGNOSIS — L814 Other melanin hyperpigmentation: Secondary | ICD-10-CM

## 2012-06-25 NOTE — Telephone Encounter (Signed)
Prior authorization needed on the Tretinoin 0.025 prescribed to patient it is not covered by her insurance. [facility] and this alternative rx covered: Differn, Atralin, Adapoline, Tretinex, and Trerinoin Microsphere

## 2012-06-26 MED ORDER — TRETINOIN 0.05 % EX GEL: CUTANEOUS | Status: DC

## 2012-07-12 DIAGNOSIS — R000 Tachycardia, unspecified: Secondary | ICD-10-CM | POA: Diagnosis not present

## 2012-07-12 DIAGNOSIS — I495 Sick sinus syndrome: Secondary | ICD-10-CM | POA: Diagnosis not present

## 2012-07-16 DIAGNOSIS — Z950 Presence of cardiac pacemaker: Secondary | ICD-10-CM | POA: Diagnosis not present

## 2012-07-16 DIAGNOSIS — I495 Sick sinus syndrome: Secondary | ICD-10-CM | POA: Diagnosis not present

## 2012-07-24 ENCOUNTER — Encounter: Payer: Self-pay | Admitting: Family Medicine

## 2012-07-24 ENCOUNTER — Ambulatory Visit (INDEPENDENT_AMBULATORY_CARE_PROVIDER_SITE_OTHER): Payer: Medicare Other | Admitting: Family Medicine

## 2012-07-24 VITALS — BP 148/80 | HR 84 | Ht 64.0 in | Wt 182.0 lb

## 2012-07-24 DIAGNOSIS — M48061 Spinal stenosis, lumbar region without neurogenic claudication: Secondary | ICD-10-CM | POA: Diagnosis not present

## 2012-07-24 DIAGNOSIS — D649 Anemia, unspecified: Secondary | ICD-10-CM

## 2012-07-24 DIAGNOSIS — D509 Iron deficiency anemia, unspecified: Secondary | ICD-10-CM | POA: Diagnosis not present

## 2012-07-24 LAB — CBC
MCHC: 32.9 g/dL (ref 30.0–36.0)
Platelets: 224 10*3/uL (ref 150–400)
RDW: 13.9 % (ref 11.5–15.5)

## 2012-07-24 MED ORDER — FERROUS SULFATE 325 (65 FE) MG PO TABS: 325.0000 mg | ORAL_TABLET | Freq: Every day | ORAL | Status: DC

## 2012-07-24 MED ORDER — SIMVASTATIN 40 MG PO TABS: 40.0000 mg | ORAL_TABLET | Freq: Every morning | ORAL | Status: DC

## 2012-07-24 NOTE — Assessment & Plan Note (Signed)
Here for low-back pain, non-radiating and appears to be associated with gardening.  For topical treatment only given CKDIII and iron-def anemia.  Recommended Aspercreme topical.  She has used Lidoderm patch in the past without relief, and is reluctant to try it again.

## 2012-07-24 NOTE — Progress Notes (Signed)
  Subjective:    Patient ID: [name], female    DOB: 20-Jun-1923, 79 y.o.   MRN: [id number]  HPI  Patietn here for follow up, and to address the following issues:  1. Would like her CBC checked again,  History of melena in the past but none since last CBC on April 1, at which time her Hgb was 12.1.  She continues to take iron 1 tab daily.  Feels "tired" when she works strenuously in the garden, otherwise well. No melena or BRBPR.  No other source of bleeding.   2. Lower back pain, localized across low back.  No radiation. No trauma.  No falls. Worse when she is working in garden, which she loves to do in Spring.  Has been out in garden a lot lately.  No weakness in lower extrs.   3.  Some nodular formations in palms of both hands, and in R wrist.  Palmar surface of R 4th digit; L 4th digit.  No 'popping' or inability to extend the fingers.  Not painful. Does not limit movement.   Just back from [place] (April 20-28).   Review of SystemsSee HPI. No palpitations.  Meds reviewed. Not taking lorazepam hardly ever.  No palpitations.  Breathing comfortably without dyspnea.  Using Spiriva and Advair as directed.      Objective:   Physical Exam Well appearing, no apparent distress HEENT Neck supple.  MSK: No tenderness along paraspinous muscles.  Negative SLR bilat.  Sensation and strength intact and symmetric in lower extremities, no edema. Palpable dp pulses bilaterally.  HANDS: Full strength handgrip bilat.  Volar aspect R wrist with nodular formation (radial aspect).  Nontender.  Palpable radial pulses bilat. Full active ROM bilat wrists. No ulnar deviation bilaterally.  Small nodularity in palmar aspect (tendon sheath) 4th digits of both hands, nontender, no erythema.  Full active/passive ROM all digits.         Assessment & Plan:

## 2012-07-24 NOTE — Patient Instructions (Addendum)
It was a pleasure to see you today.  We will check your blood count today and I will call you with the results [phone]   For your low back, Aspercreme after your exercise/yardwork.    Please return for follow up in the coming 8 to 10 weeks, or sooner if needed.

## 2012-07-25 ENCOUNTER — Telehealth: Payer: Self-pay | Admitting: Family Medicine

## 2012-07-25 NOTE — Telephone Encounter (Signed)
Called patient to report increase in her Hgb to 12.6.  She is to continue to take 1 iron tablet a day.  I believe we can back down on the frequency of her CBC checks, unless she experiences signs/sx of GI bleeding.  She voices agreement with this plan.  Dalbert Mayotte, MD

## 2012-07-26 DIAGNOSIS — H353290 Exudative age-related macular degeneration, unspecified eye, stage unspecified: Secondary | ICD-10-CM | POA: Diagnosis not present

## 2012-08-13 ENCOUNTER — Other Ambulatory Visit: Payer: Self-pay | Admitting: Family Medicine

## 2012-08-18 ENCOUNTER — Emergency Department (HOSPITAL_COMMUNITY): Payer: Medicare Other

## 2012-08-18 ENCOUNTER — Emergency Department (HOSPITAL_COMMUNITY)
Admission: EM | Admit: 2012-08-18 | Discharge: 2012-08-18 | Disposition: A | Discharge status: Home / Self Care | Payer: Medicare Other | Attending: Emergency Medicine | Admitting: Emergency Medicine

## 2012-08-18 ENCOUNTER — Encounter (HOSPITAL_COMMUNITY): Payer: Self-pay

## 2012-08-18 DIAGNOSIS — Z8739 Personal history of other diseases of the musculoskeletal system and connective tissue: Secondary | ICD-10-CM | POA: Insufficient documentation

## 2012-08-18 DIAGNOSIS — Z87891 Personal history of nicotine dependence: Secondary | ICD-10-CM | POA: Diagnosis not present

## 2012-08-18 DIAGNOSIS — Z79899 Other long term (current) drug therapy: Secondary | ICD-10-CM | POA: Insufficient documentation

## 2012-08-18 DIAGNOSIS — Z8619 Personal history of other infectious and parasitic diseases: Secondary | ICD-10-CM | POA: Insufficient documentation

## 2012-08-18 DIAGNOSIS — Z8669 Personal history of other diseases of the nervous system and sense organs: Secondary | ICD-10-CM | POA: Insufficient documentation

## 2012-08-18 DIAGNOSIS — J438 Other emphysema: Secondary | ICD-10-CM | POA: Diagnosis not present

## 2012-08-18 DIAGNOSIS — Z860100 Personal history of colon polyps, unspecified: Secondary | ICD-10-CM | POA: Insufficient documentation

## 2012-08-18 DIAGNOSIS — I4891 Unspecified atrial fibrillation: Secondary | ICD-10-CM | POA: Diagnosis not present

## 2012-08-18 DIAGNOSIS — J4489 Other specified chronic obstructive pulmonary disease: Secondary | ICD-10-CM | POA: Insufficient documentation

## 2012-08-18 DIAGNOSIS — N183 Chronic kidney disease, stage 3 unspecified: Secondary | ICD-10-CM | POA: Diagnosis not present

## 2012-08-18 DIAGNOSIS — I252 Old myocardial infarction: Secondary | ICD-10-CM | POA: Diagnosis not present

## 2012-08-18 DIAGNOSIS — J449 Chronic obstructive pulmonary disease, unspecified: Secondary | ICD-10-CM | POA: Diagnosis not present

## 2012-08-18 DIAGNOSIS — Z8719 Personal history of other diseases of the digestive system: Secondary | ICD-10-CM | POA: Insufficient documentation

## 2012-08-18 DIAGNOSIS — I129 Hypertensive chronic kidney disease with stage 1 through stage 4 chronic kidney disease, or unspecified chronic kidney disease: Secondary | ICD-10-CM | POA: Diagnosis not present

## 2012-08-18 DIAGNOSIS — Z950 Presence of cardiac pacemaker: Secondary | ICD-10-CM | POA: Diagnosis not present

## 2012-08-18 DIAGNOSIS — R002 Palpitations: Secondary | ICD-10-CM

## 2012-08-18 DIAGNOSIS — Z8679 Personal history of other diseases of the circulatory system: Secondary | ICD-10-CM | POA: Insufficient documentation

## 2012-08-18 LAB — CBC WITH DIFFERENTIAL/PLATELET
Basophils Absolute: 0 10*3/uL (ref 0.0–0.1)
Basophils Relative: 1 % (ref 0–1)
Eosinophils Absolute: 0.2 10*3/uL (ref 0.0–0.7)
MCH: 30.2 pg (ref 26.0–34.0)
MCHC: 33.3 g/dL (ref 30.0–36.0)
Neutrophils Relative %: 57 % (ref 43–77)
Platelets: 185 10*3/uL (ref 150–400)
RDW: 13 % (ref 11.5–15.5)

## 2012-08-18 LAB — COMPREHENSIVE METABOLIC PANEL
ALT: 11 U/L (ref 0–35)
AST: 22 U/L (ref 0–37)
Albumin: 3.6 g/dL (ref 3.5–5.2)
Alkaline Phosphatase: 79 U/L (ref 39–117)
BUN: 26 mg/dL — ABNORMAL HIGH (ref 6–23)
Potassium: 3.8 mEq/L (ref 3.5–5.1)
Sodium: 138 mEq/L (ref 135–145)
Total Protein: 6.5 g/dL (ref 6.0–8.3)

## 2012-08-18 LAB — POCT I-STAT TROPONIN I: Troponin i, poc: 0.01 ng/mL (ref 0.00–0.08)

## 2012-08-18 LAB — PROTIME-INR: Prothrombin Time: 12.1 seconds (ref 11.6–15.2)

## 2012-08-18 NOTE — ED Notes (Signed)
Pt here for episode of palpitations this am at 1 for approx 1 hour. sts subsided with ntg and lorazepam and only complains of headache at this time, due for PM check this coming monday

## 2012-08-18 NOTE — ED Provider Notes (Signed)
History     CSN: [id number]  Arrival date & time 08/18/12  [id number]   First MD Initiated Contact with Patient 08/18/12 [phone]      Chief Complaint  Patient presents with  . Palpitations    (Consider location/radiation/quality/duration/timing/severity/associated sxs/prior treatment) HPI [name] is a very pleasant elderly woman with a history of COPD, CAD, s/p MI and PCI in 2008. She has also experienced paroxysmal AF in the past and has a pacemaker placed for SSS. She presents after experiencing a sensation of fast and pounding heartbeat. His symptoms began around 11:30 after she had just gotten into bed for the night.  Her symptoms lasted couple of hours. She took a lorazepam but the symptoms persisted. Thus, she called 911. She says that her symptoms resolved shortly after EMS arrived at her house. She denies any experience of chest pain, chest pressure, shortness of breath, cough or fever.  She has been resting comfortably in the emergency department since she arrived. She denies any recent changes to medications. She reports having had a normal nuclear stress test less than one year ago.  Past Medical History  Diagnosis Date  . Candidiasis of the esophagus 11/26/2007  . Myocardial infarct, old   . COPD, severe   . Sick sinus syndrome with tachycardia   . Pacemaker   . Chronic kidney disease (CKD), stage III (moderate)   . Hypertension   . Acute appendicitis with rupture   . Spinal stenosis, lumbar   . Adrenal adenoma     Incidentaloma  . Spondylolisthesis of lumbar region   . Lumbar herniated disc     History of HNP L4/5 in 2003  . Hx of colonoscopy with polypectomy 04/27/2010    Dr Cristina Gong found three  tubular adenomas each less than 10 mm size  . Retinal hemorrhage of left eye 06/2010  . Cataract 2013    Bilateral   . Abnormal mammogram, unspecified 08/23/2010    Followup imaging reassuring.  Repeat in 6 months.   Marland Kitchen DISC WITH RADICULOPATHY 04/27/2006    Qualifier: Diagnosis  of  By: McDiarmid MD, Sherren Mocha      Past Surgical History  Procedure Laterality Date  . Pacemaker insertion      Dr Lovena Le ([facility])  . Nephrectomy  For transition cell cancer     Dr Tresa Endo, surgeon  . Replacement total knee  2009, Right knee    Dr Wynelle Link  . Replacement total knee  05/2009, Left knee    Dr Wynelle Link  . Knee arthroscopy w/ synovectomy  11/2009, left knee    Dr Wynelle Link  . Cholecystectomy    . Esophagogastroduodenoscopy  04/27/2010    Dr Cristina Gong - found transient H/H & Schatzki's ring  . Esophagogastroduodenoscopy endoscopy  03/06/2012    Dr Cristina Gong - found non-obstuctive Schatzki's ring at [employer] [facility]. o/w normal EGD.     No family history on file.  History  Substance Use Topics  . Smoking status: Former Smoker    Types: Cigarettes  . Smokeless tobacco: Never Used  . Alcohol Use: 3.0 oz/week    6 drink(s) per week     Comment: 4-5 glasses of wine per week    OB History   Grav Para Term Preterm Abortions TAB SAB Ect Mult Living                  Review of Systems Gen: no weight loss, fevers, chills, night sweats Eyes: no discharge or drainage, no occular pain or visual  changes Nose: no epistaxis or rhinorrhea Mouth: no dental pain, no sore throat Neck: no neck pain Lungs: no SOB, cough, wheezing CV: no chest pain, palpitations, dependent edema or orthopnea Abd: no abdominal pain, nausea, vomiting GU: no dysuria or gross hematuria MSK: no myalgias or arthralgias Neuro: no headache, no focal neurologic deficits Skin: no rash Psyche: negative.  Allergies  Codeine phosphate; Hydrochlorothiazide; and Montelukast sodium  Home Medications   Current Outpatient Rx  Name  Route  Sig  Dispense  Refill  . albuterol (VENTOLIN HFA) 108 (90 BASE) MCG/ACT inhaler   Inhalation   Inhale 2 puffs into the lungs every 6 (six) hours as needed. If needed for shortness of breath.   1 Inhaler   3   . cyclobenzaprine (FLEXERIL) 5 MG tablet   Oral   Take 1  tablet (5 mg total) by mouth 3 (three) times daily as needed.   30 tablet   3   . ferrous sulfate 325 (65 FE) MG tablet   Oral   Take 1 tablet (325 mg total) by mouth daily. One tablet once daily   30 tablet   6   . fesoterodine (TOVIAZ) 8 MG TB24   Oral   Take 8 mg by mouth daily. Rx started by Dr Bjorn Loser [facility])         . Fluticasone-Salmeterol (ADVAIR DISKUS) 250-50 MCG/DOSE AEPB   Inhalation   Inhale 1 puff into the lungs 2 (two) times daily.           Marland Kitchen EXPIRED: furosemide (LASIX) 40 MG tablet   Oral   Take 1 tablet (40 mg total) by mouth daily as needed (leg swelling).   30 tablet   3   . lidocaine (LIDODERM) 5 %   Transdermal   Place 1 patch onto the skin daily. Remove & Discard patch within 12 hours or as directed by MD   6 patch   0   . LORazepam (ATIVAN) 0.5 MG tablet   Oral   Take 0.5 mg by mouth every 8 (eight) hours as needed for anxiety.         . metoprolol succinate (TOPROL-XL) 50 MG 24 hr tablet      TAKE ONE TABLET EVERY DAY WITH FOOD   90 tablet   2   . Misc. Devices [facility]) MISC   Does not apply   1 each by Does not apply route daily. Folding Walker with wheels and walker seat attachement   1 each   0   . Multiple Vitamin (MULTIVITAMIN) tablet   Oral   Take 1 tablet by mouth daily.           . nitroGLYCERIN (NITROSTAT) 0.4 MG SL tablet   Sublingual   Place 1 tablet (0.4 mg total) under the tongue every 5 (five) minutes as needed.   30 tablet   1   . omeprazole (PRILOSEC) 40 MG capsule      TAKE ONE CAPSULE BY MOUTH EVERY DAY   30 capsule   6   . simvastatin (ZOCOR) 40 MG tablet   Oral   Take 1 tablet (40 mg total) by mouth every morning.   30 tablet   3   . tiotropium (SPIRIVA) 18 MCG inhalation capsule   Inhalation   Place 1 capsule (18 mcg total) into inhaler and inhale daily.   30 capsule   PRN   . traMADol (ULTRAM) 50 MG tablet   Oral   Take 1 tablet (  50 mg total) by mouth every 6 (six)  hours as needed for pain. 1-2 tablets four times a day as needed for pain.   60 tablet   3   . tretinoin (ALTRALIN) 0.05 % gel      Apply Pea-sized amount to skin of gel to skin of face, then to back of hands and forearms daily each night at bedtime   45 g   5   . vitamin B-12 (CYANOCOBALAMIN) 1000 MCG tablet   Oral   Take 1,000 mcg by mouth daily.            BP 109/70  Pulse 60  Temp(Src) 98.1 F (36.7 C) (Oral)  Resp 20  Ht 5' 4.5" (1.638 m)  Wt 178 lb (80.74 kg)  BMI 30.09 kg/m2  SpO2 97%  Physical Exam Gen: well developed and well nourished appearing, very pleasant and cheerful Head: NCAT Eyes: PERL, EOMI Nose: no epistaixis or rhinorrhea Mouth/throat: mucosa is moist and pink Neck: supple, no stridor Lungs: CTA B, no wheezing, rhonchi or rales CV: RRR, no murmur, no ectopic beats, rate 72 Abd: soft, notender, nondistended Back: no ttp, no cva ttp Skin: no rashese, wnl Neuro: CN ii-xii grossly intact, no focal deficits Ext: no edema Psyche; normal affect,  calm and cooperative.   ED Course  Procedures (including critical care time)  Results for orders placed during the hospital encounter of 08/18/12 (from the past 24 hour(s))  CBC WITH DIFFERENTIAL     Status: None   Collection Time    08/18/12  4:24 AM      Result Value Range   WBC 6.2  4.0 - 10.5 K/uL   RBC 4.01  3.87 - 5.11 MIL/uL   Hemoglobin 12.1  12.0 - 15.0 g/dL   HCT 36.3  36.0 - 46.0 %   MCV 90.5  78.0 - 100.0 fL   MCH 30.2  26.0 - 34.0 pg   MCHC 33.3  30.0 - 36.0 g/dL   RDW 13.0  11.5 - 15.5 %   Platelets 185  150 - 400 K/uL   Neutrophils Relative % 57  43 - 77 %   Neutro Abs 3.5  1.7 - 7.7 K/uL   Lymphocytes Relative 29  12 - 46 %   Lymphs Abs 1.8  0.7 - 4.0 K/uL   Monocytes Relative 10  3 - 12 %   Monocytes Absolute 0.6  0.1 - 1.0 K/uL   Eosinophils Relative 4  0 - 5 %   Eosinophils Absolute 0.2  0.0 - 0.7 K/uL   Basophils Relative 1  0 - 1 %   Basophils Absolute 0.0  0.0 - 0.1  K/uL  COMPREHENSIVE METABOLIC PANEL     Status: Abnormal   Collection Time    08/18/12  4:24 AM      Result Value Range   Sodium 138  135 - 145 mEq/L   Potassium 3.8  3.5 - 5.1 mEq/L   Chloride 104  96 - 112 mEq/L   CO2 25  19 - 32 mEq/L   Glucose, Bld 114 (*) 70 - 99 mg/dL   BUN 26 (*) 6 - 23 mg/dL   Creatinine, Ser 1.03  0.50 - 1.10 mg/dL   Calcium 9.1  8.4 - 10.5 mg/dL   Total Protein 6.5  6.0 - 8.3 g/dL   Albumin 3.6  3.5 - 5.2 g/dL   AST 22  0 - 37 U/L   ALT 11  0 - 35 U/L  Alkaline Phosphatase 79  39 - 117 U/L   Total Bilirubin 0.3  0.3 - 1.2 mg/dL   GFR calc non Af Amer 48 (*) >90 mL/min   GFR calc Af Amer 55 (*) >90 mL/min  PROTIME-INR     Status: None   Collection Time    08/18/12  4:24 AM      Result Value Range   Prothrombin Time 12.1  11.6 - 15.2 seconds   INR 0.90  0.00 - 1.49  MAGNESIUM     Status: None   Collection Time    08/18/12  4:24 AM      Result Value Range   Magnesium 1.9  1.5 - 2.5 mg/dL  POCT I-STAT TROPONIN I     Status: None   Collection Time    08/18/12  4:36 AM      Result Value Range   Troponin i, poc 0.01  0.00 - 0.08 ng/mL   Comment 3            EKG: Paced rhythm no acute ischemic changes, normal intervals, normal axis, normal qrs complex, interval resolution of diffuse T wave inversions noted on most recent EKG from 2008.  CXR: LLL bibasilar infiltrates unchanged from prior and likely representing pulmonary scarring from multiple previous episodes of pneumonia.   MDM  Patient is s/p transient and self limited episode of palpitations. In light of her history, I suspect that she experienced an episode of paroxysmal AF. EKG is now normal and patient is in paced rhythm. No electrolyte abnormalities. Tpn drawn 4h after start of symptoms is normal. NO recent medication changes. Patient is stable for discharge with plan to follow up closely with her PCP at [place], MD 08/18/12 [phone]

## 2012-08-18 NOTE — ED Notes (Signed)
Patient is resting comfortably. 

## 2012-08-24 DIAGNOSIS — R0609 Other forms of dyspnea: Secondary | ICD-10-CM | POA: Diagnosis not present

## 2012-08-24 DIAGNOSIS — R0989 Other specified symptoms and signs involving the circulatory and respiratory systems: Secondary | ICD-10-CM | POA: Diagnosis not present

## 2012-08-27 DIAGNOSIS — J45909 Unspecified asthma, uncomplicated: Secondary | ICD-10-CM | POA: Diagnosis not present

## 2012-08-27 DIAGNOSIS — D500 Iron deficiency anemia secondary to blood loss (chronic): Secondary | ICD-10-CM | POA: Diagnosis not present

## 2012-08-27 DIAGNOSIS — I2510 Atherosclerotic heart disease of native coronary artery without angina pectoris: Secondary | ICD-10-CM | POA: Diagnosis not present

## 2012-08-27 DIAGNOSIS — I4891 Unspecified atrial fibrillation: Secondary | ICD-10-CM | POA: Diagnosis not present

## 2012-08-27 DIAGNOSIS — I10 Essential (primary) hypertension: Secondary | ICD-10-CM | POA: Diagnosis not present

## 2012-08-28 DIAGNOSIS — I4891 Unspecified atrial fibrillation: Secondary | ICD-10-CM | POA: Diagnosis not present

## 2012-08-28 DIAGNOSIS — Z950 Presence of cardiac pacemaker: Secondary | ICD-10-CM | POA: Diagnosis not present

## 2012-08-29 ENCOUNTER — Telehealth: Payer: Self-pay | Admitting: Family Medicine

## 2012-08-29 NOTE — Telephone Encounter (Signed)
Patient would like to speak to Dr. Lindell Noe about her appt with the Cardiologist.

## 2012-08-29 NOTE — Telephone Encounter (Signed)
Spoke with patient at length regarding her visit to see Dr. [name] (cardio) two days ago.  She was told at that visit she may need to be put back on Coumadin.  She really wanted to talk to Dr. McDiarmid about this since he knows her history.  She would really appreciate Dr. Lindell Noe call her back.  Told pt I would pass along the message.  [name], Loralyn Freshwater, [address]

## 2012-08-30 NOTE — Telephone Encounter (Signed)
Appt scheduled with Dr. Lindell Noe for Tuesday 09/04/12 at 10:15.  Pt has spoken with Dr. McDiarmid who is going to call pt's cardio, Dr. Pernell Dupre, and discuss pt starting Coumadin again.  Pt will f/u on 09/04/12.  Veronica Fretz, Loralyn Freshwater, [place]

## 2012-08-30 NOTE — Telephone Encounter (Signed)
I am away from the office until Monday, July 7th. If urgent matter, please triage accordingly.  If a problem that will likely require a change in management, please schedule patient appointment. JB

## 2012-09-03 ENCOUNTER — Telehealth: Payer: Self-pay | Admitting: Family Medicine

## 2012-09-03 NOTE — Telephone Encounter (Signed)
I spoke with Dr Daneen Schick, cardiologist for [name].  He relates that the recent interrogation of patient's pacemaker showed frequent mode changes consistent with paroxysmal atrial fibrillation.   Given risk of stroke (age, sex, hypertension, vascular disease [hx NSTEMI] CHADS-VASc = 5 ==> 6.7% annual CVA risk and HAS-BLED score = 3 ==> 3.7% annual bleed risk, we agreed that a trial of closely monitored anticoagulation  to prevent stroke and systemic embolization and close monitoring of hemoglobin for occult blood loss would be reasonable.  I discussed this conversation with [name].  She was amenable to a trial of anticoagulation and close monitoring for adverse effects.  She will discuss this therapy with Dr Lindell Noe tomorrow during an office visit.

## 2012-09-04 ENCOUNTER — Encounter: Payer: Self-pay | Admitting: Family Medicine

## 2012-09-04 ENCOUNTER — Ambulatory Visit (INDEPENDENT_AMBULATORY_CARE_PROVIDER_SITE_OTHER): Payer: Medicare Other | Admitting: *Deleted

## 2012-09-04 ENCOUNTER — Ambulatory Visit (INDEPENDENT_AMBULATORY_CARE_PROVIDER_SITE_OTHER): Payer: Medicare Other | Admitting: Family Medicine

## 2012-09-04 VITALS — BP 146/86 | HR 72 | Wt 184.0 lb

## 2012-09-04 DIAGNOSIS — Z7901 Long term (current) use of anticoagulants: Secondary | ICD-10-CM | POA: Diagnosis not present

## 2012-09-04 DIAGNOSIS — I4891 Unspecified atrial fibrillation: Secondary | ICD-10-CM | POA: Diagnosis not present

## 2012-09-04 DIAGNOSIS — I480 Paroxysmal atrial fibrillation: Secondary | ICD-10-CM

## 2012-09-04 MED ORDER — LORAZEPAM 0.5 MG PO TABS: 0.5000 mg | ORAL_TABLET | Freq: Three times a day (TID) | ORAL | Status: DC | PRN

## 2012-09-04 MED ORDER — WARFARIN SODIUM 2.5 MG PO TABS: 5.0000 mg | ORAL_TABLET | Freq: Every day | ORAL | Status: DC

## 2012-09-04 NOTE — Assessment & Plan Note (Signed)
Initiation of warfarin today at 5mg /day, for INR check in [facility] after 3 doses.  Prior dosing requirements not available to me from [facility] during this visit. Notified patient we will likely want to recheck INR after the first adjustment and possibly a second time next week before her trip.

## 2012-09-04 NOTE — Progress Notes (Signed)
  Subjective:    Patient ID: [name], female    DOB: 11/13/1923, 79 y.o.   MRN: [id number]  HPI Patient with PAF diagnosed from pacer interrogation, decision to initiate warfarin therapy.  Patient has been on warfarin in the past for AF, cannot recall her dosing at that time.  She spoke with Dr McDiarmid about this by phone and voices her agreement with treatment to reduce risk of stroke.  Is planning to go out of town next Friday (July 18th) but agrees with treatment and will keep lab follow-ups.  Reports that her anxiety has been heightened recently.  Had been on lorazepam 0.5mg , last taken 3 years ago, would like a new prescription. Discussed the risks of sedation and physiologic habituation.  Has had mild 'nagging' chest discomfort when she get anxious lately; no associated diaphoresis, no dyspnea, no nausea.  Does not feel like GERD to her.    Review of Systems see HPI.     Objective:   Physical Exam Well appearing, no apparent distress. Pulse high 70s, appears regular by my check (no ECG ordered today).  HEENT Neck supple. No thyroid masses.   COR S1S2, no extra sounds. No chest wall tenderness with palpation.        Assessment & Plan:

## 2012-09-04 NOTE — Patient Instructions (Signed)
It was a pleasure to see you today.  We are starting you back on the warfarin (Coumadin) at 5mg  nightly for the next 3 nights (tonight, tomorrow, and Thursday July 10th).  IT IS CRITICAL THAT YOU COME FOR A LAB CHECK OF YOUR INR ON Friday< July 11th HERE IN OUR LAB.   WE WILL ALSO WANT YOU BACK NEXT Monday FOR ANOTHER CHECK, ONCE WE BEGIN TO ADJUST THE DOSING.

## 2012-09-06 DIAGNOSIS — H353290 Exudative age-related macular degeneration, unspecified eye, stage unspecified: Secondary | ICD-10-CM | POA: Diagnosis not present

## 2012-09-07 ENCOUNTER — Ambulatory Visit (INDEPENDENT_AMBULATORY_CARE_PROVIDER_SITE_OTHER): Payer: Medicare Other | Admitting: *Deleted

## 2012-09-07 DIAGNOSIS — Z7901 Long term (current) use of anticoagulants: Secondary | ICD-10-CM | POA: Diagnosis not present

## 2012-09-10 ENCOUNTER — Other Ambulatory Visit: Payer: Self-pay | Admitting: Family Medicine

## 2012-09-10 ENCOUNTER — Ambulatory Visit (INDEPENDENT_AMBULATORY_CARE_PROVIDER_SITE_OTHER): Payer: Medicare Other | Admitting: *Deleted

## 2012-09-10 DIAGNOSIS — Z7901 Long term (current) use of anticoagulants: Secondary | ICD-10-CM | POA: Diagnosis not present

## 2012-09-10 DIAGNOSIS — I4891 Unspecified atrial fibrillation: Secondary | ICD-10-CM

## 2012-09-10 DIAGNOSIS — I480 Paroxysmal atrial fibrillation: Secondary | ICD-10-CM

## 2012-09-10 LAB — PROTIME-INR: Prothrombin Time: 34.3 seconds — ABNORMAL HIGH (ref 11.6–15.2)

## 2012-09-10 MED ORDER — WARFARIN SODIUM 1 MG PO TABS: 1.0000 mg | ORAL_TABLET | ORAL | Status: DC

## 2012-09-13 ENCOUNTER — Ambulatory Visit (INDEPENDENT_AMBULATORY_CARE_PROVIDER_SITE_OTHER): Payer: Medicare Other | Admitting: *Deleted

## 2012-09-13 DIAGNOSIS — Z7901 Long term (current) use of anticoagulants: Secondary | ICD-10-CM | POA: Diagnosis not present

## 2012-09-26 ENCOUNTER — Ambulatory Visit (INDEPENDENT_AMBULATORY_CARE_PROVIDER_SITE_OTHER): Payer: Medicare Other | Admitting: *Deleted

## 2012-09-26 DIAGNOSIS — Z7901 Long term (current) use of anticoagulants: Secondary | ICD-10-CM | POA: Diagnosis not present

## 2012-09-26 DIAGNOSIS — D649 Anemia, unspecified: Secondary | ICD-10-CM | POA: Diagnosis not present

## 2012-09-26 LAB — POCT INR: INR: 1.4

## 2012-10-01 ENCOUNTER — Ambulatory Visit: Payer: Medicare Other

## 2012-10-01 ENCOUNTER — Other Ambulatory Visit: Payer: Self-pay | Admitting: *Deleted

## 2012-10-01 ENCOUNTER — Encounter: Payer: Self-pay | Admitting: Family Medicine

## 2012-10-01 ENCOUNTER — Ambulatory Visit (INDEPENDENT_AMBULATORY_CARE_PROVIDER_SITE_OTHER): Payer: Medicare Other | Admitting: *Deleted

## 2012-10-01 ENCOUNTER — Ambulatory Visit (INDEPENDENT_AMBULATORY_CARE_PROVIDER_SITE_OTHER): Payer: Medicare Other | Admitting: Family Medicine

## 2012-10-01 VITALS — BP 104/64 | HR 64 | Ht 64.0 in | Wt 183.0 lb

## 2012-10-01 DIAGNOSIS — N6489 Other specified disorders of breast: Secondary | ICD-10-CM | POA: Diagnosis not present

## 2012-10-01 DIAGNOSIS — Z7901 Long term (current) use of anticoagulants: Secondary | ICD-10-CM

## 2012-10-01 DIAGNOSIS — L03115 Cellulitis of right lower limb: Secondary | ICD-10-CM

## 2012-10-01 DIAGNOSIS — L03119 Cellulitis of unspecified part of limb: Secondary | ICD-10-CM

## 2012-10-01 DIAGNOSIS — L02419 Cutaneous abscess of limb, unspecified: Secondary | ICD-10-CM | POA: Diagnosis not present

## 2012-10-01 HISTORY — DX: Cellulitis of right lower limb: L03.115

## 2012-10-01 MED ORDER — CEPHALEXIN 500 MG PO CAPS: 500.0000 mg | ORAL_CAPSULE | Freq: Four times a day (QID) | ORAL | Status: DC

## 2012-10-01 MED ORDER — WARFARIN SODIUM 1 MG PO TABS: 2.0000 mg | ORAL_TABLET | ORAL | Status: DC

## 2012-10-01 NOTE — Assessment & Plan Note (Signed)
Patient recently started long-term anticoagulation with Coumadin for history of atrial fibrillation, INR today was 2.0 on 2 mg Coumadin daily -We'll continue current dose of 2 mg daily and recheck INR in 7 days -Patient also presented today with abrasion to right leg, clinical picture does not suggest Coumadin related wound, will continue current management with Coumadin

## 2012-10-01 NOTE — Progress Notes (Signed)
  Subjective:    Patient ID: [name], female    DOB: Jul 15, 1923, 79 y.o.   MRN: [id number]  HPI Since her evaluation of wound to right leg, this is a present for the past 2 weeks, patient states that she initially injured the area while moving luggage into the car and had an abrasion to the right lateral aspect of the leg, she's been applying Neosporin to the area daily, she did apply a dressing a few days however has been allowing the wound to mostly air dry, she does have bilateral lower extremity edema which is unchanged from previous, patient states that she did note some erythema surrounding the wound for the past few days, Denies fevers and chills, minimal pain, no streaking erythema, no discharge  Patient was started on Coumadin approximately 3 weeks ago for atrial fibrillation, she is due for recheck of INR today, denies any other bruising, denies bleeding from recent wound     Review of Systems  Constitutional: Negative for fever, chills and fatigue.  Respiratory: Negative for apnea and cough.   Cardiovascular: Positive for leg swelling. Negative for chest pain.  Gastrointestinal: Negative for nausea and diarrhea.  Skin: Positive for color change and wound.       Objective:   Physical Exam Vitals: Reviewed and unremarkable Cardiac: Regular rate and rhythm, S1 and S2 present, 3/6 systolic murmur appreciated Respiratory: Normal effort, clear to auscultation bilaterally, no wheezes, crackles, or rales Skin examination: Approximately 1.5 x 1.5 cm abrasion to the right lateral, small area of surrounding erythema is present, areas warm to touch, no active drainage or discharge, no streaking erythema Extremity: 1+ bilateral lower extremity edema, 2 plus dorsalis pedis pulses bilaterally       Assessment & Plan:

## 2012-10-01 NOTE — Patient Instructions (Signed)
Cellulitis (infection of right leg) - Start antibiotics tonight (will take for total of 7 days), wear Maryln Manuel for the next 7 days to apply compression to the area, apply Vasoline to the affected area 2-3 times per day and cover with gauze to keep area moist.   Please call the office if the affected area is not improving or increasing in size.

## 2012-10-01 NOTE — Assessment & Plan Note (Signed)
1.5 cm x 1.5 cm abrasion to the right lower extremity with surrounding cellulitis -Will treat with seven-day course of Keflex -Will prescribe compression stockings as this may be attributed to poor wound healing -Patient was instructed to return to office if wound is not healing or increasing in size

## 2012-10-02 NOTE — Progress Notes (Signed)
Reviewed anticoagulation therapy with Ms. Bonnie Martinique for patient [name]. Agree with current dose of 2 mg daily and recheck INR in 2 weeks.

## 2012-10-08 ENCOUNTER — Ambulatory Visit (INDEPENDENT_AMBULATORY_CARE_PROVIDER_SITE_OTHER): Payer: Medicare Other | Admitting: *Deleted

## 2012-10-08 DIAGNOSIS — Z950 Presence of cardiac pacemaker: Secondary | ICD-10-CM | POA: Diagnosis not present

## 2012-10-08 DIAGNOSIS — Z7901 Long term (current) use of anticoagulants: Secondary | ICD-10-CM

## 2012-10-08 DIAGNOSIS — I495 Sick sinus syndrome: Secondary | ICD-10-CM | POA: Diagnosis not present

## 2012-10-09 DIAGNOSIS — J449 Chronic obstructive pulmonary disease, unspecified: Secondary | ICD-10-CM | POA: Diagnosis not present

## 2012-10-16 DIAGNOSIS — H353290 Exudative age-related macular degeneration, unspecified eye, stage unspecified: Secondary | ICD-10-CM | POA: Diagnosis not present

## 2012-10-16 DIAGNOSIS — H43819 Vitreous degeneration, unspecified eye: Secondary | ICD-10-CM | POA: Diagnosis not present

## 2012-10-16 DIAGNOSIS — H353190 Nonexudative age-related macular degeneration, unspecified eye, stage unspecified: Secondary | ICD-10-CM | POA: Diagnosis not present

## 2012-10-18 ENCOUNTER — Ambulatory Visit (INDEPENDENT_AMBULATORY_CARE_PROVIDER_SITE_OTHER): Payer: Medicare Other | Admitting: *Deleted

## 2012-10-18 DIAGNOSIS — Z7901 Long term (current) use of anticoagulants: Secondary | ICD-10-CM

## 2012-10-18 DIAGNOSIS — D509 Iron deficiency anemia, unspecified: Secondary | ICD-10-CM

## 2012-10-18 LAB — CBC
MCV: 92.1 fL (ref 78.0–100.0)
Platelets: 247 10*3/uL (ref 150–400)
RBC: 4.07 MIL/uL (ref 3.87–5.11)
WBC: 6.5 10*3/uL (ref 4.0–10.5)

## 2012-10-18 LAB — FERRITIN: Ferritin: 66 ng/mL (ref 10–291)

## 2012-10-18 NOTE — Progress Notes (Signed)
CBC & Ferritin drawn along with INR. Pt has appt with Dr. McDiarmid on 10-25-12 [place], [place]

## 2012-10-19 DIAGNOSIS — C679 Malignant neoplasm of bladder, unspecified: Secondary | ICD-10-CM | POA: Diagnosis not present

## 2012-10-19 DIAGNOSIS — Z8551 Personal history of malignant neoplasm of bladder: Secondary | ICD-10-CM | POA: Diagnosis not present

## 2012-10-23 ENCOUNTER — Encounter: Payer: Self-pay | Admitting: Family Medicine

## 2012-10-23 DIAGNOSIS — C669 Malignant neoplasm of unspecified ureter: Secondary | ICD-10-CM | POA: Insufficient documentation

## 2012-10-25 ENCOUNTER — Ambulatory Visit (INDEPENDENT_AMBULATORY_CARE_PROVIDER_SITE_OTHER): Payer: Medicare Other | Admitting: *Deleted

## 2012-10-25 ENCOUNTER — Ambulatory Visit (INDEPENDENT_AMBULATORY_CARE_PROVIDER_SITE_OTHER): Payer: Medicare Other | Admitting: Family Medicine

## 2012-10-25 ENCOUNTER — Encounter: Payer: Self-pay | Admitting: Family Medicine

## 2012-10-25 VITALS — BP 149/63 | HR 66 | Temp 97.6°F | Wt 182.0 lb

## 2012-10-25 DIAGNOSIS — R0600 Dyspnea, unspecified: Secondary | ICD-10-CM

## 2012-10-25 DIAGNOSIS — R0609 Other forms of dyspnea: Secondary | ICD-10-CM

## 2012-10-25 DIAGNOSIS — I4891 Unspecified atrial fibrillation: Secondary | ICD-10-CM | POA: Diagnosis not present

## 2012-10-25 DIAGNOSIS — D509 Iron deficiency anemia, unspecified: Secondary | ICD-10-CM

## 2012-10-25 DIAGNOSIS — Z7901 Long term (current) use of anticoagulants: Secondary | ICD-10-CM

## 2012-10-25 DIAGNOSIS — Z23 Encounter for immunization: Secondary | ICD-10-CM

## 2012-10-25 DIAGNOSIS — G3184 Mild cognitive impairment, so stated: Secondary | ICD-10-CM | POA: Diagnosis not present

## 2012-10-25 DIAGNOSIS — H353290 Exudative age-related macular degeneration, unspecified eye, stage unspecified: Secondary | ICD-10-CM | POA: Diagnosis not present

## 2012-10-25 DIAGNOSIS — I480 Paroxysmal atrial fibrillation: Secondary | ICD-10-CM

## 2012-10-25 DIAGNOSIS — J449 Chronic obstructive pulmonary disease, unspecified: Secondary | ICD-10-CM

## 2012-10-25 DIAGNOSIS — I10 Essential (primary) hypertension: Secondary | ICD-10-CM | POA: Diagnosis not present

## 2012-10-25 DIAGNOSIS — R0989 Other specified symptoms and signs involving the circulatory and respiratory systems: Secondary | ICD-10-CM | POA: Diagnosis not present

## 2012-10-25 LAB — POCT INR: INR: 2.6

## 2012-10-25 NOTE — Patient Instructions (Signed)
I believe you have some inflammation of your airways that are contributing to your shortness of breath. Please start Prednisone 20 mg tablets, two tablets a day for 5 days to help reduce the inflammation of your airways.  We are checking a blood test (Brain Naturetic Peptide) to see if any of this shortness of breath could be coming from your heart.  Your Hemoglobin was 12.7 g / dL which is in the normal range.  Your serum ferritin was in the normal range indicating that your body's iron stores are in the normal range.  Please continue your daily iron tablet.   YOur Protime (INR) is 2.9 today which is in the therapeutic range for preventing Strokes.  Keep taking your Warfarin 2 mg daily.  Recheck in 2 weeks.

## 2012-10-26 ENCOUNTER — Encounter: Payer: Self-pay | Admitting: Family Medicine

## 2012-10-26 ENCOUNTER — Telehealth: Payer: Self-pay | Admitting: Family Medicine

## 2012-10-26 ENCOUNTER — Encounter: Payer: Medicare Other | Admitting: Internal Medicine

## 2012-10-26 DIAGNOSIS — F0390 Unspecified dementia without behavioral disturbance: Secondary | ICD-10-CM

## 2012-10-26 DIAGNOSIS — G3184 Mild cognitive impairment, so stated: Secondary | ICD-10-CM

## 2012-10-26 HISTORY — DX: Mild cognitive impairment of uncertain or unknown etiology: G31.84

## 2012-10-26 HISTORY — DX: Unspecified dementia, unspecified severity, without behavioral disturbance, psychotic disturbance, mood disturbance, and anxiety: F03.90

## 2012-10-26 LAB — BRAIN NATRIURETIC PEPTIDE: Brain Natriuretic Peptide: 951.8 pg/mL — ABNORMAL HIGH (ref 0.0–100.0)

## 2012-10-26 NOTE — Assessment & Plan Note (Signed)
HGB 12.7 10/18/2012   Lab Results  Component Value Date   FERRITIN 66 10/18/2012   Adequate Hgb.  Adequate Iron. Given ongoing Warfarin therapy and occult blood loss history, will ask patient to stay on one Ferrous Sulfate tablet a day.

## 2012-10-26 NOTE — Assessment & Plan Note (Signed)
Working diagnosis of AE-COPD. Has been treated with Antibiotics recently. Recommend Prednisone 40 mg daily for 5 days. [name] has supply of prednisone at home that she will start.  Will not treat with antibiotics for now. Continue Spiriva and Advair.  Will check BNP to right/o CHF component.

## 2012-10-26 NOTE — Assessment & Plan Note (Signed)
Adequate blood pressure control.  No evidence of new end organ damage.  Tolerating medication without significant adverse effects.  Plan to continue current blood pressure regiment.   

## 2012-10-26 NOTE — Assessment & Plan Note (Signed)
New concern for patient. Failed MiniCog (2/3 object and failed clock draw) Given that Mrs [name] is not displaying loss of ability to meet her independent ADLs, and the absence of depressive symptoms, I hesitate to assert that she has more than Mild Cognitive Impairment.   Will perform a MMSE on next office visit to confirm MiniCog result.  May have both short-term memory and some executive function impairment.   Pt is well supported psychosocially by family, friends and community.

## 2012-10-26 NOTE — Progress Notes (Signed)
  Subjective:    Patient ID: [name], female    DOB: 1923-07-04, 79 y.o.   MRN: [id number]  HPI  COPD Current symptoms include dyspnea after 1 blocks, dyspnea after 1 flights of stairs, non-productive cough and wheezing. Symptoms have been present since a week ago and have been unchanged. She denies weight loss and fever. Associated symptoms include increase sputum.  This episode appears to have been triggered by humid hot weather. Treatments tried for the current exacerbation: albuterol inhaler, salmeterol/fluticasone inhaler and tiotropium. The patient has been having similar episodes for approximately 1 week. Pt was treated with Z-Pak by Dr Casper Harrison [facility]) in July.  Pt received Keflex recently from Dr Ree Kida for a traumatic leg cellulitis.   CHRONIC HYPERTENSION  Disease Monitoring  Blood pressure range: not checking at home  Chest pain: no   Dyspnea: yes, see above   Claudication: no   Medication compliance: yes  Medication Side Effects  Lightheadedness: no   Urinary frequency: yes, at baseline   Edema: no, at baseline leg swelling at end of day that improves overnight.     Preventitive Healthcare:  Exercise: no, but remains active   Diet Pattern: no restrictions  Salt Restriction: no restrictions  Function - Pt independent in ADLs and iADLs - Pt has always hired assistance with her housekeeping and yard work but she manages the persons who help her. - Pt has always had professionals to handle her finances but she feels she understands her assests and liabilities - Pt concerned about her difficulty recalling names. - No falls.     Review of Systems No bleeding No melena/hematochezia/epistaxis    Objective:   Physical Exam VS reviewed GEN: Alert, Cooperative, Groomed, NAD HEENT: PERRL; EAC bilaterally not occluded, TM's translucent with normal LM, (+) LR;                No cervical LAN, No thyromegaly, No palpable masses COR: RRR, No M/G/R, No JVD,  Normal PMI size and location LUNGS: late bilateral monophonic expiratory wheeze, No Acc mm use, speaking in full sentences ABDOMEN: (+)BS, soft,   EXT: Trace peripheral ankle  Edema bilaterally  Neuro: Oriented to person, place, and time;  Gait: Normal speed, No significant path deviation, Step through +,  Psych: Normal affect/thought/speech/language  MiniCog: two out of three object recall, and failed clock test.  PHQ-2: No depressed mood or ahedonia        Assessment & Plan:

## 2012-10-28 ENCOUNTER — Other Ambulatory Visit: Payer: Self-pay | Admitting: Family Medicine

## 2012-10-30 ENCOUNTER — Encounter: Payer: Self-pay | Admitting: *Deleted

## 2012-10-30 ENCOUNTER — Telehealth: Payer: Self-pay | Admitting: Family Medicine

## 2012-10-30 ENCOUNTER — Ambulatory Visit (INDEPENDENT_AMBULATORY_CARE_PROVIDER_SITE_OTHER): Payer: Medicare Other | Admitting: Cardiology

## 2012-10-30 ENCOUNTER — Encounter: Payer: Self-pay | Admitting: Cardiology

## 2012-10-30 VITALS — BP 119/64 | HR 65 | Ht 64.0 in | Wt 180.8 lb

## 2012-10-30 DIAGNOSIS — I495 Sick sinus syndrome: Secondary | ICD-10-CM

## 2012-10-30 DIAGNOSIS — Z950 Presence of cardiac pacemaker: Secondary | ICD-10-CM

## 2012-10-30 DIAGNOSIS — I4891 Unspecified atrial fibrillation: Secondary | ICD-10-CM | POA: Diagnosis not present

## 2012-10-30 DIAGNOSIS — Z45010 Encounter for checking and testing of cardiac pacemaker pulse generator [battery]: Secondary | ICD-10-CM

## 2012-10-30 DIAGNOSIS — I480 Paroxysmal atrial fibrillation: Secondary | ICD-10-CM

## 2012-10-30 DIAGNOSIS — Z45018 Encounter for adjustment and management of other part of cardiac pacemaker: Secondary | ICD-10-CM

## 2012-10-30 NOTE — Patient Instructions (Addendum)
Your physician has recommended that you have a pacemaker GENERATOR CHANGE. A pacemaker is a small device that is placed under the skin of your chest or abdomen to help control abnormal heart rhythms. This device uses electrical pulses to prompt the heart to beat at a normal rate. Pacemakers are used to treat heart rhythms that are too slow. Wire (leads) are attached to the pacemaker that goes into the chambers of you heart. This is done in the hospital and usually requires and overnight stay. Please see the instruction sheet given to you today for more information.  Your physician recommends that you return for lab work in: [email or web address] ON November 08, 2012  Your physician has recommended you make the following change in your medication: DO NOT Sublette  Your physician recommends that you continue on your current medications as directed. Please refer to the Current Medication list given to you today.

## 2012-10-30 NOTE — Telephone Encounter (Signed)
X °

## 2012-10-30 NOTE — Telephone Encounter (Signed)
[name] reports that she has been taking two 10-mg Lasix daily for last three days without improvement in dyspnea. Recommended that she increase her Lasix to 40 md daily for next 4 days.  Asked that she contact me on Friday to let us know how her dyspnea is doing and her tolerance to the lasix.

## 2012-10-30 NOTE — Progress Notes (Signed)
ELECTROPHYSIOLOGY OFFICE NOTE  Patient ID: [name] MRN: [id number], DOB/AGE: 09/10/1923   Date of Visit: 10/30/2012  Primary Physician: Acquanetta Sit, MD Primary Cardiologist / EP: Daneen Schick, MD / Cristopher Peru, MD Reason for Visit: PPM battery at ERI  History of Present Illness  [name] is a 79 y.o. female with tachy-brady syndrome s/p PPM implant, AF, CAD s/p PCI, COPD and HTN who presents today for electrophysiology followup. Her PPM battery has reached [facility].  She reports she is doing well and has no complaints. She had one episode of chest tightness on Sunday which was relieved by SL NTG x 1. This is followed by Tammy. Tamala Stewart. She tells me her last stress test was about 6 months ago and was "okay." Unfortunately I do not have these records at the time of this dictation. She has chronic SOB due to COPD but denies any worsening shortness of breath / DOE. She denies palpitations, dizziness, near syncope or syncope. She denies LE swelling, orthopnea, PND or recent weight gain. She is compliant and tolerating medications without difficulty.  Past Medical History Past Medical History  Diagnosis Date  . Candidiasis of the esophagus 11/26/2007  . Myocardial infarct, old   . COPD, severe   . Sick sinus syndrome with tachycardia   . Pacemaker   . Chronic kidney disease (CKD), stage III (moderate)   . Hypertension   . Acute appendicitis with rupture   . Spinal stenosis, lumbar   . Adrenal adenoma     Incidentaloma  . Spondylolisthesis of lumbar region   . Lumbar herniated disc     History of HNP L4/5 in 2003  . Hx of colonoscopy with polypectomy 04/27/2010    Tammy Cristina Gong found three  tubular adenomas each less than 10 mm size  . Retinal hemorrhage of left eye 06/2010  . Cataract 2013    Bilateral   . Abnormal mammogram, unspecified 08/23/2010    Followup imaging reassuring.  Repeat in 6 months.   Marland Kitchen DISC WITH RADICULOPATHY 04/27/2006    Qualifier: Diagnosis of  By: McDiarmid MD,  Sherren Mocha    . Transitional cell carcinoma of ureter, history   . Mild cognitive impairment 10/26/2012    (10/25/12) Failed MiniCog screen    Past Surgical History Past Surgical History  Procedure Laterality Date  . Pacemaker insertion      Tammy Lovena Le ([facility])  . Nephrectomy  For transition cell cancer     Tammy Tresa Endo, surgeon  . Replacement total knee  2009, Right knee    Tammy Wynelle Link  . Replacement total knee  05/2009, Left knee    Tammy Wynelle Link  . Knee arthroscopy w/ synovectomy  11/2009, left knee    Tammy Wynelle Link  . Cholecystectomy    . Esophagogastroduodenoscopy  04/27/2010    Tammy Cristina Gong - found transient H/H & Schatzki's ring  . Esophagogastroduodenoscopy endoscopy  03/06/2012    Tammy Cristina Gong - found non-obstuctive Schatzki's ring at [facility] o/w normal EGD.     Allergies/Intolerances Allergies  Allergen Reactions  . Codeine Phosphate Itching    REACTION: unspecified  . Hydrochlorothiazide Other (See Comments)    Possible acute gouty arthritis of wrist  . Montelukast Sodium     REACTION: unspecified   Current Home Medications Current Outpatient Prescriptions  Medication Sig Dispense Refill  . albuterol (VENTOLIN HFA) 108 (90 BASE) MCG/ACT inhaler Inhale 2 puffs into the lungs every 6 (six) hours as needed. If needed for shortness of breath.  1 Inhaler  3  . cyclobenzaprine (FLEXERIL) 5 MG tablet Take 1 tablet (5 mg total) by mouth 3 (three) times daily as needed.  30 tablet  3  . ferrous sulfate 325 (65 FE) MG tablet Take 1 tablet (325 mg total) by mouth daily. One tablet once daily  30 tablet  6  . Fluticasone-Salmeterol (ADVAIR DISKUS) 250-50 MCG/DOSE AEPB Inhale 1 puff into the lungs 2 (two) times daily.        . furosemide (LASIX) 40 MG tablet       . LORazepam (ATIVAN) 0.5 MG tablet Take 1 tablet (0.5 mg total) by mouth every 8 (eight) hours as needed for anxiety.  30 tablet  0  . metoprolol succinate (TOPROL-XL) 50 MG 24 hr tablet Take 50 mg by mouth daily. Take with or  immediately following a meal.      . nitroGLYCERIN (NITROSTAT) 0.4 MG SL tablet Place 1 tablet (0.4 mg total) under the tongue every 5 (five) minutes as needed.  30 tablet  1  . simvastatin (ZOCOR) 40 MG tablet Take 1 tablet (40 mg total) by mouth every morning.  30 tablet  3  . tiotropium (SPIRIVA) 18 MCG inhalation capsule Place 1 capsule (18 mcg total) into inhaler and inhale daily.  30 capsule  PRN  . vitamin B-12 (CYANOCOBALAMIN) 1000 MCG tablet Take 1,000 mcg by mouth daily.       Marland Kitchen warfarin (COUMADIN) 1 MG tablet Take 2 tablets (2 mg total) by mouth as directed.  60 tablet  2   No current facility-administered medications for this visit.   Social History Social History  . Marital Status: Widowed   Occupational History  . Futures trader     Retired   Social History Main Topics  . Smoking status: Former Smoker    Types: Cigarettes  . Smokeless tobacco: Never Used  . Alcohol Use: 3.0 oz/week    6 drink(s) per week     Comment: 4-5 glasses of wine per week  . 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    Semi-retired Futures trader.     Pt has home nebulizer for inhalation therapies.   Former Smoker   Smoking Status:  quit < 6 months      (+) DNR status per discussion with Tammy McDiarmid 10/25/12.   (+) Living Will/Advance Directive   (+) HC-POA: Cecile Sheerer Causey (pt's dgt)    Review of Systems General: No chills, fever, night sweats or weight changes Cardiovascular: No chest pain, dyspnea on exertion, edema, orthopnea, palpitations, paroxysmal nocturnal dyspnea Dermatological: No rash, lesions or masses Respiratory: No cough, dyspnea Urologic: No hematuria, dysuria Abdominal: No nausea, vomiting, diarrhea, bright red blood per rectum, melena, or hematemesis Neurologic: No visual  changes, weakness, changes in mental status All other systems reviewed and are otherwise negative except as noted above.  Physical Exam Vitals: Blood pressure 119/64, pulse 65, height 5\' 4"  (1.626 m), weight 180 lb 12.8 oz (82.01 kg).  General: Well developed, well appearing 79 y.o. female in no acute distress. HEENT: Normocephalic, atraumatic. EOMs intact. Sclera nonicteric. Oropharynx clear.  Neck: Supple without bruits. No JVD. Lungs: Respirations regular and unlabored, CTA bilaterally. No wheezes, rales or rhonchi. Heart: RRR. S1, S2 present. No murmurs, rub, S3 or  S4. Abdomen: Soft, non-distended. Extremities: No clubbing, cyanosis or edema. DP/PT/Radials 2+ and equal bilaterally. Psych: Normal affect. Neuro: Alert and oriented X 3. Moves all extremities spontaneously.   Diagnostics Device interrogation - limited - battery at [facility] since 09/13/2012; V paced 23.3%; no VHR episodes  Assessment and Plan 1. PPM battery at [facility] 2. Tachy-brady syndrome s/p PPM implant 3. Atrial fibrillation 4. CAD 5. COPD  Ms. Heinsohn presents for EP/device follow-up. Her PPM battery is at [facility]. I discussed the need for PPM generator change with her. I reviewed the procedure involved, including risks and benefits. These risks include but are not limited to bleeding, infection and/or lead dislodgement / malfunction. Ms. Dorothy expressed verbal understanding of the procedure including risks and benefits and wishes to proceed. This is scheduled with Tammy. Lovena Le on 11/12/2012.  Signed, Ileene Hutchinson, PA-C 10/30/2012, 3:12 PM

## 2012-10-31 ENCOUNTER — Other Ambulatory Visit: Payer: Self-pay | Admitting: Family Medicine

## 2012-10-31 LAB — PACEMAKER DEVICE OBSERVATION
BAMS-0001: 175 {beats}/min
BRDY-0004RV: 130 {beats}/min
RV LEAD IMPEDENCE PM: 905 Ohm
VENTRICULAR PACING PM: 23.3

## 2012-10-31 MED ORDER — PREDNISONE 20 MG PO TABS: 40.0000 mg | ORAL_TABLET | Freq: Every day | ORAL | Status: DC

## 2012-11-05 DIAGNOSIS — R000 Tachycardia, unspecified: Secondary | ICD-10-CM | POA: Diagnosis not present

## 2012-11-06 ENCOUNTER — Encounter (HOSPITAL_COMMUNITY): Payer: Self-pay | Admitting: Pharmacy Technician

## 2012-11-08 ENCOUNTER — Telehealth: Payer: Self-pay | Admitting: Cardiology

## 2012-11-08 ENCOUNTER — Ambulatory Visit (INDEPENDENT_AMBULATORY_CARE_PROVIDER_SITE_OTHER): Payer: Medicare Other | Admitting: *Deleted

## 2012-11-08 ENCOUNTER — Other Ambulatory Visit: Payer: Medicare Other

## 2012-11-08 DIAGNOSIS — I252 Old myocardial infarction: Secondary | ICD-10-CM

## 2012-11-08 DIAGNOSIS — I4891 Unspecified atrial fibrillation: Secondary | ICD-10-CM | POA: Diagnosis not present

## 2012-11-08 DIAGNOSIS — I10 Essential (primary) hypertension: Secondary | ICD-10-CM | POA: Diagnosis not present

## 2012-11-08 DIAGNOSIS — I480 Paroxysmal atrial fibrillation: Secondary | ICD-10-CM

## 2012-11-08 DIAGNOSIS — Z7901 Long term (current) use of anticoagulants: Secondary | ICD-10-CM

## 2012-11-08 LAB — POCT INR: INR: 2.9

## 2012-11-08 NOTE — Telephone Encounter (Signed)
New Problem  Pt is having a procedure done on Monday was scheduled to have blood work drawn today// location was not on the same link// also wants to know if she needs to stop taking the coumadin before the appt.

## 2012-11-08 NOTE — Telephone Encounter (Signed)
Rec'd a call from [place] about her labs. BUN/Cr are higher than previous values. They are being sent to office.  Will route this to [place] and [facility] so they can review the labs.

## 2012-11-08 NOTE — Telephone Encounter (Signed)
Called stating she is having pacemaker changed out on Mon.  Had INR checked today at PCP's office.  Wants to know what to do about Coumadin.  Has not had other pre op labs done.  INR today was 2.9.  Will come in today to get rest of lab done. Advised will call her with results.

## 2012-11-09 LAB — PROTIME-INR: Prothrombin Time: 28.2 seconds — ABNORMAL HIGH (ref 11.6–15.2)

## 2012-11-09 NOTE — Telephone Encounter (Signed)
Spoke with patient, he labs are still not in the computer.  I have called [facility] and asked them to have them scanned.  She is going to hold her dose of Coumadin on Sun prior to gen change with her INR being 2.9

## 2012-11-11 DIAGNOSIS — I4891 Unspecified atrial fibrillation: Secondary | ICD-10-CM | POA: Diagnosis not present

## 2012-11-11 DIAGNOSIS — I495 Sick sinus syndrome: Secondary | ICD-10-CM | POA: Diagnosis not present

## 2012-11-11 DIAGNOSIS — Z79899 Other long term (current) drug therapy: Secondary | ICD-10-CM | POA: Diagnosis not present

## 2012-11-11 DIAGNOSIS — Z9861 Coronary angioplasty status: Secondary | ICD-10-CM | POA: Diagnosis not present

## 2012-11-11 DIAGNOSIS — Z7901 Long term (current) use of anticoagulants: Secondary | ICD-10-CM | POA: Diagnosis not present

## 2012-11-11 DIAGNOSIS — I2510 Atherosclerotic heart disease of native coronary artery without angina pectoris: Secondary | ICD-10-CM | POA: Diagnosis not present

## 2012-11-11 DIAGNOSIS — J449 Chronic obstructive pulmonary disease, unspecified: Secondary | ICD-10-CM | POA: Diagnosis not present

## 2012-11-11 DIAGNOSIS — I129 Hypertensive chronic kidney disease with stage 1 through stage 4 chronic kidney disease, or unspecified chronic kidney disease: Secondary | ICD-10-CM | POA: Diagnosis not present

## 2012-11-11 DIAGNOSIS — Z45018 Encounter for adjustment and management of other part of cardiac pacemaker: Secondary | ICD-10-CM | POA: Diagnosis not present

## 2012-11-11 MED ORDER — SODIUM CHLORIDE 0.9 % IR SOLN
80.0000 mg | Status: AC
  Filled 2012-11-11: qty 2

## 2012-11-11 MED ORDER — CEFAZOLIN SODIUM-DEXTROSE 2-3 GM-% IV SOLR
2.0000 g | INTRAVENOUS | Status: AC
  Filled 2012-11-11: qty 50

## 2012-11-12 ENCOUNTER — Encounter (HOSPITAL_COMMUNITY)
Admission: RE | Disposition: A | Discharge status: Home / Self Care | Payer: Self-pay | Source: Ambulatory Visit | Attending: Internal Medicine

## 2012-11-12 ENCOUNTER — Ambulatory Visit (HOSPITAL_COMMUNITY)
Admission: RE | Admit: 2012-11-12 | Discharge: 2012-11-12 | Disposition: A | Discharge status: Home / Self Care | Payer: Medicare Other | Source: Ambulatory Visit | Attending: Internal Medicine | Admitting: Internal Medicine

## 2012-11-12 DIAGNOSIS — I4891 Unspecified atrial fibrillation: Secondary | ICD-10-CM | POA: Diagnosis not present

## 2012-11-12 DIAGNOSIS — N183 Chronic kidney disease, stage 3 unspecified: Secondary | ICD-10-CM | POA: Insufficient documentation

## 2012-11-12 DIAGNOSIS — I495 Sick sinus syndrome: Secondary | ICD-10-CM | POA: Diagnosis not present

## 2012-11-12 DIAGNOSIS — Z9861 Coronary angioplasty status: Secondary | ICD-10-CM | POA: Insufficient documentation

## 2012-11-12 DIAGNOSIS — Z950 Presence of cardiac pacemaker: Secondary | ICD-10-CM | POA: Diagnosis not present

## 2012-11-12 DIAGNOSIS — Z45018 Encounter for adjustment and management of other part of cardiac pacemaker: Secondary | ICD-10-CM | POA: Diagnosis not present

## 2012-11-12 DIAGNOSIS — J449 Chronic obstructive pulmonary disease, unspecified: Secondary | ICD-10-CM | POA: Insufficient documentation

## 2012-11-12 DIAGNOSIS — I2510 Atherosclerotic heart disease of native coronary artery without angina pectoris: Secondary | ICD-10-CM | POA: Insufficient documentation

## 2012-11-12 DIAGNOSIS — I129 Hypertensive chronic kidney disease with stage 1 through stage 4 chronic kidney disease, or unspecified chronic kidney disease: Secondary | ICD-10-CM | POA: Insufficient documentation

## 2012-11-12 DIAGNOSIS — Z45010 Encounter for checking and testing of cardiac pacemaker pulse generator [battery]: Secondary | ICD-10-CM

## 2012-11-12 DIAGNOSIS — Z79899 Other long term (current) drug therapy: Secondary | ICD-10-CM | POA: Insufficient documentation

## 2012-11-12 DIAGNOSIS — J4489 Other specified chronic obstructive pulmonary disease: Secondary | ICD-10-CM | POA: Insufficient documentation

## 2012-11-12 DIAGNOSIS — I480 Paroxysmal atrial fibrillation: Secondary | ICD-10-CM

## 2012-11-12 DIAGNOSIS — Z7901 Long term (current) use of anticoagulants: Secondary | ICD-10-CM | POA: Insufficient documentation

## 2012-11-12 HISTORY — PX: PACEMAKER GENERATOR CHANGE: SHX5481

## 2012-11-12 LAB — PROTIME-INR
INR: 1.85 — ABNORMAL HIGH (ref 0.00–1.49)
Prothrombin Time: 20.8 seconds — ABNORMAL HIGH (ref 11.6–15.2)

## 2012-11-12 SURGERY — PACEMAKER GENERATOR CHANGE
Anesthesia: LOCAL

## 2012-11-12 MED ORDER — MUPIROCIN 2 % EX OINT
TOPICAL_OINTMENT | CUTANEOUS | Status: AC
  Filled 2012-11-12: qty 22

## 2012-11-12 MED ORDER — ONDANSETRON HCL 4 MG/2ML IJ SOLN: 4.0000 mg | Freq: Four times a day (QID) | INTRAMUSCULAR | Status: DC | PRN

## 2012-11-12 MED ORDER — MUPIROCIN 2 % EX OINT
TOPICAL_OINTMENT | Freq: Two times a day (BID) | CUTANEOUS | Status: DC
  Administered 2012-11-12: 13:00:00 via NASAL

## 2012-11-12 MED ORDER — FENTANYL CITRATE 0.05 MG/ML IJ SOLN
INTRAMUSCULAR | Status: AC
  Filled 2012-11-12: qty 2

## 2012-11-12 MED ORDER — MIDAZOLAM HCL 5 MG/5ML IJ SOLN
INTRAMUSCULAR | Status: AC
  Filled 2012-11-12: qty 5

## 2012-11-12 MED ORDER — SODIUM CHLORIDE 0.9 % IV SOLN
INTRAVENOUS | Status: DC
  Administered 2012-11-12: 13:00:00 via INTRAVENOUS

## 2012-11-12 MED ORDER — CHLORHEXIDINE GLUCONATE 4 % EX LIQD: 60.0000 mL | Freq: Once | CUTANEOUS | Status: DC

## 2012-11-12 MED ORDER — ACETAMINOPHEN 325 MG PO TABS
325.0000 mg | ORAL_TABLET | ORAL | Status: DC | PRN
  Filled 2012-11-12: qty 2

## 2012-11-12 MED ORDER — LIDOCAINE HCL (PF) 1 % IJ SOLN
INTRAMUSCULAR | Status: AC
  Filled 2012-11-12: qty 60

## 2012-11-12 NOTE — CV Procedure (Signed)
DDD PM removal and insertion of a new DDD PM without immediate complication. D# [id number].

## 2012-11-12 NOTE — Interval H&P Note (Signed)
History and Physical Interval Note:  11/12/2012 2:57 PM  [name]  has presented today for surgery, with the diagnosis of End of life  The various methods of treatment have been discussed with the patient and family. After consideration of risks, benefits and other options for treatment, the patient has consented to  Procedure(s): PACEMAKER GENERATOR CHANGE (N/A) as a surgical intervention .  The patient's history has been reviewed, patient examined, no change in status, stable for surgery.  I have reviewed the patient's chart and labs.  Questions were answered to the patient's satisfaction.     Mikle Bosworth.D.

## 2012-11-12 NOTE — Interval H&P Note (Signed)
History and Physical Interval Note:  11/12/2012 2:57 PM  [name]  has presented today for surgery, with the diagnosis of End of life  The various methods of treatment have been discussed with the patient and family. After consideration of risks, benefits and other options for treatment, the patient has consented to  Procedure(s): PACEMAKER GENERATOR CHANGE (N/A) as a surgical intervention .  The patient's history has been reviewed, patient examined, no change in status, stable for surgery.  I have reviewed the patient's chart and labs.  Questions were answered to the patient's satisfaction.     Cristopher Peru

## 2012-11-12 NOTE — Interval H&P Note (Signed)
History and Physical Interval Note: Patient seen and examined. Will proceed with PM generator change out.   11/12/2012 2:58 PM  [name]  has presented today for surgery, with the diagnosis of End of life  The various methods of treatment have been discussed with the patient and family. After consideration of risks, benefits and other options for treatment, the patient has consented to  Procedure(s): PACEMAKER GENERATOR CHANGE (N/A) as a surgical intervention .  The patient's history has been reviewed, patient examined, no change in status, stable for surgery.  I have reviewed the patient's chart and labs.  Questions were answered to the patient's satisfaction.     Mikle Bosworth.D.

## 2012-11-12 NOTE — H&P (View-Only) (Signed)
ELECTROPHYSIOLOGY OFFICE NOTE  Patient ID: [name] MRN: [id number], DOB/AGE: 1923-06-14   Date of Visit: 10/30/2012  Primary Physician: Acquanetta Sit, MD Primary Cardiologist / EP: Daneen Schick, MD / Cristopher Peru, MD Reason for Visit: PPM battery at [facility]  History of Present Illness  [name] is a 79 y.o. female with tachy-brady syndrome s/p PPM implant, AF, CAD s/p PCI, COPD and HTN who presents today for electrophysiology followup. Her PPM battery has reached ERI.  She reports she is doing well and has no complaints. She had one episode of chest tightness on Sunday which was relieved by SL NTG x 1. This is followed by Dr. [name]. She tells me her last stress test was about 6 months ago and was "okay." Unfortunately I do not have these records at the time of this dictation. She has chronic SOB due to COPD but denies any worsening shortness of breath / DOE. She denies palpitations, dizziness, near syncope or syncope. She denies LE swelling, orthopnea, PND or recent weight gain. She is compliant and tolerating medications without difficulty.  Past Medical History Past Medical History  Diagnosis Date  . Candidiasis of the esophagus 11/26/2007  . Myocardial infarct, old   . COPD, severe   . Sick sinus syndrome with tachycardia   . Pacemaker   . Chronic kidney disease (CKD), stage III (moderate)   . Hypertension   . Acute appendicitis with rupture   . Spinal stenosis, lumbar   . Adrenal adenoma     Incidentaloma  . Spondylolisthesis of lumbar region   . Lumbar herniated disc     History of HNP L4/5 in 2003  . Hx of colonoscopy with polypectomy 04/27/2010    Dr Cristina Gong found three  tubular adenomas each less than 10 mm size  . Retinal hemorrhage of left eye 06/2010  . Cataract 2013    Bilateral   . Abnormal mammogram, unspecified 08/23/2010    Followup imaging reassuring.  Repeat in 6 months.   Marland Kitchen DISC WITH RADICULOPATHY 04/27/2006    Qualifier: Diagnosis of  By: McDiarmid MD,  Sherren Mocha    . Transitional cell carcinoma of ureter, history   . Mild cognitive impairment 10/26/2012    (10/25/12) Failed MiniCog screen    Past Surgical History Past Surgical History  Procedure Laterality Date  . Pacemaker insertion      Dr Lovena Le ([facility])  . Nephrectomy  For transition cell cancer     Dr Tresa Endo, surgeon  . Replacement total knee  2009, Right knee    Dr Wynelle Link  . Replacement total knee  05/2009, Left knee    Dr Wynelle Link  . Knee arthroscopy w/ synovectomy  11/2009, left knee    Dr Wynelle Link  . Cholecystectomy    . Esophagogastroduodenoscopy  04/27/2010    Dr Cristina Gong - found transient H/H & Schatzki's ring  . Esophagogastroduodenoscopy endoscopy  03/06/2012    Dr Cristina Gong - found non-obstuctive Schatzki's ring at [facility] o/w normal EGD.     Allergies/Intolerances Allergies  Allergen Reactions  . Codeine Phosphate Itching    REACTION: unspecified  . Hydrochlorothiazide Other (See Comments)    Possible acute gouty arthritis of wrist  . Montelukast Sodium     REACTION: unspecified   Current Home Medications Current Outpatient Prescriptions  Medication Sig Dispense Refill  . albuterol (VENTOLIN HFA) 108 (90 BASE) MCG/ACT inhaler Inhale 2 puffs into the lungs every 6 (six) hours as needed. If needed for shortness of breath.  1 Inhaler  3  . cyclobenzaprine (FLEXERIL) 5 MG tablet Take 1 tablet (5 mg total) by mouth 3 (three) times daily as needed.  30 tablet  3  . ferrous sulfate 325 (65 FE) MG tablet Take 1 tablet (325 mg total) by mouth daily. One tablet once daily  30 tablet  6  . Fluticasone-Salmeterol (ADVAIR DISKUS) 250-50 MCG/DOSE AEPB Inhale 1 puff into the lungs 2 (two) times daily.        . furosemide (LASIX) 40 MG tablet       . LORazepam (ATIVAN) 0.5 MG tablet Take 1 tablet (0.5 mg total) by mouth every 8 (eight) hours as needed for anxiety.  30 tablet  0  . metoprolol succinate (TOPROL-XL) 50 MG 24 hr tablet Take 50 mg by mouth daily. Take with or  immediately following a meal.      . nitroGLYCERIN (NITROSTAT) 0.4 MG SL tablet Place 1 tablet (0.4 mg total) under the tongue every 5 (five) minutes as needed.  30 tablet  1  . simvastatin (ZOCOR) 40 MG tablet Take 1 tablet (40 mg total) by mouth every morning.  30 tablet  3  . tiotropium (SPIRIVA) 18 MCG inhalation capsule Place 1 capsule (18 mcg total) into inhaler and inhale daily.  30 capsule  PRN  . vitamin B-12 (CYANOCOBALAMIN) 1000 MCG tablet Take 1,000 mcg by mouth daily.       Marland Kitchen warfarin (COUMADIN) 1 MG tablet Take 2 tablets (2 mg total) by mouth as directed.  60 tablet  2   No current facility-administered medications for this visit.   Social History Social History  . Marital Status: Widowed   Occupational History  . Futures trader     Retired   Social History Main Topics  . Smoking status: Former Smoker    Types: Cigarettes  . Smokeless tobacco: Never Used  . Alcohol Use: 3.0 oz/week    6 drink(s) per week     Comment: 4-5 glasses of wine per week  . 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    Semi-retired Futures trader.     Pt has home nebulizer for inhalation therapies.   Former Smoker   Smoking Status:  quit < 6 months      (+) DNR status per discussion with Dr McDiarmid 10/25/12.   (+) Living Will/Advance Directive   (+) HC-POA: Cecile Sheerer Causey (pt's dgt)    Review of Systems General: No chills, fever, night sweats or weight changes Cardiovascular: No chest pain, dyspnea on exertion, edema, orthopnea, palpitations, paroxysmal nocturnal dyspnea Dermatological: No rash, lesions or masses Respiratory: No cough, dyspnea Urologic: No hematuria, dysuria Abdominal: No nausea, vomiting, diarrhea, bright red blood per rectum, melena, or hematemesis Neurologic: No visual  changes, weakness, changes in mental status All other systems reviewed and are otherwise negative except as noted above.  Physical Exam Vitals: Blood pressure 119/64, pulse 65, height 5\' 4"  (1.626 m), weight 180 lb 12.8 oz (82.01 kg).  General: Well developed, well appearing 79 y.o. female in no acute distress. HEENT: Normocephalic, atraumatic. EOMs intact. Sclera nonicteric. Oropharynx clear.  Neck: Supple without bruits. No JVD. Lungs: Respirations regular and unlabored, CTA bilaterally. No wheezes, rales or rhonchi. Heart: RRR. S1, S2 present. No murmurs, rub, S3 or  S4. Abdomen: Soft, non-distended. Extremities: No clubbing, cyanosis or edema. DP/PT/Radials 2+ and equal bilaterally. Psych: Normal affect. Neuro: Alert and oriented X 3. Moves all extremities spontaneously.   Diagnostics Device interrogation - limited - battery at ERI since 09/13/2012; V paced 23.3%; no VHR episodes  Assessment and Plan 1. PPM battery at [facility] 2. Tachy-brady syndrome s/p PPM implant 3. Atrial fibrillation 4. CAD 5. COPD  Ms. Kadrmas presents for EP/device follow-up. Her PPM battery is at [facility]. I discussed the need for PPM generator change with her. I reviewed the procedure involved, including risks and benefits. These risks include but are not limited to bleeding, infection and/or lead dislodgement / malfunction. Ms. Guiang expressed verbal understanding of the procedure including risks and benefits and wishes to proceed. This is scheduled with Dr. Lovena Le on 11/12/2012.  Signed, Ileene Hutchinson, PA-C 10/30/2012, 3:12 PM

## 2012-11-13 ENCOUNTER — Encounter: Payer: Self-pay | Admitting: Internal Medicine

## 2012-11-13 NOTE — Op Note (Signed)
NAMELiam, [name]                   ACCOUNT NO.:  [id number]  MEDICAL RECORD NO.:  [id number]  LOCATION:  [facility]                         FACILITY:  Uncertain  PHYSICIAN:  Champ Mungo. Lovena Le, MD    DATE OF BIRTH:  1923-06-05  DATE OF PROCEDURE:  11/12/2012 DATE OF DISCHARGE:  11/12/2012                              OPERATIVE REPORT   PROCEDURE PERFORMED:  Removal of previously implanted Medtronic dual- chamber pacemaker and insertion of a new Medtronic dual-chamber pacemaker.  INDICATION:  Symptomatic bradycardia with the patient's previous dual- chamber pacemaker and elective replacement.  INTRODUCTION:  The patient is a very pleasant 79 year old woman with a history of symptomatic bradycardia, status post permanent pacemaker insertion in the past.  Her pacemaker has reached elective replacement. She is now referred for insertion of a new device.  PROCEDURE IN DETAIL:  After informed consent was obtained, the patient was taken to the diagnostic EP lab in a fasting state.  After usual preparation and draping, intravenous fentanyl and midazolam were given for sedation.  A 30 mL of lidocaine was infiltrated into the right infraclavicular region.  A 5-cm incision was carried out over this region.  Electrocautery was utilized to dissect down to the pacemaker pocket.  The generator was removed and the atrial and ventricular leads were found to be working satisfactorily.  The new Medtronic Sensia dual- chamber pacemaker serial number [id number] was connected to the atrial and RV leads and placed back in the subcutaneous pocket.  The pocket was irrigated with antibiotic irrigation.  The generator was secured with silk suture, and the incision was closed with 2-0 and 3-0 Vicryl. Benzoin and Steri-Strips were painted on the skin, pressure dressing was applied, and the patient was returned to her room in satisfactory condition.  COMPLICATIONS:  There were no immediate procedure  complications.  RESULTS:  Demonstrate successful removal of her previously implanted Medtronic dual-chamber pacemaker and insertion of a new dual-chamber pacemaker in a patient with symptomatic bradycardia.     Champ Mungo. Lovena Le, MD     GWT/MEDQ  D:  11/12/2012  T:  11/13/2012  Job:  [id number]

## 2012-11-19 ENCOUNTER — Other Ambulatory Visit: Payer: Self-pay | Admitting: Cardiology

## 2012-11-19 ENCOUNTER — Ambulatory Visit (INDEPENDENT_AMBULATORY_CARE_PROVIDER_SITE_OTHER): Payer: Medicare Other | Admitting: *Deleted

## 2012-11-19 ENCOUNTER — Telehealth: Payer: Self-pay | Admitting: Internal Medicine

## 2012-11-19 DIAGNOSIS — I495 Sick sinus syndrome: Secondary | ICD-10-CM | POA: Diagnosis not present

## 2012-11-19 LAB — PACEMAKER DEVICE OBSERVATION
AL IMPEDENCE PM: 650 Ohm
AL THRESHOLD: 0.5 V
ATRIAL PACING PM: 97
BAMS-0001: 150 {beats}/min
RV LEAD AMPLITUDE: 22.4 mv
RV LEAD THRESHOLD: 0.5 V

## 2012-11-19 NOTE — Progress Notes (Signed)
Wound check-PPM in office. 

## 2012-11-19 NOTE — Telephone Encounter (Signed)
New Problem:  Pt states she had a recent pacemaker procedure w/ Dr Lovena Le. Pt wants to know how she is to care for the surgical site. Pt also states she would like further instructions on what to do.

## 2012-11-20 DIAGNOSIS — H353290 Exudative age-related macular degeneration, unspecified eye, stage unspecified: Secondary | ICD-10-CM | POA: Diagnosis not present

## 2012-11-20 DIAGNOSIS — H401490 Capsular glaucoma with pseudoexfoliation of lens, unspecified eye, stage unspecified: Secondary | ICD-10-CM | POA: Diagnosis not present

## 2012-11-20 DIAGNOSIS — H353190 Nonexudative age-related macular degeneration, unspecified eye, stage unspecified: Secondary | ICD-10-CM | POA: Diagnosis not present

## 2012-11-22 ENCOUNTER — Ambulatory Visit: Payer: Medicare Other

## 2012-11-29 ENCOUNTER — Encounter: Payer: Self-pay | Admitting: Internal Medicine

## 2012-12-01 ENCOUNTER — Encounter: Payer: Self-pay | Admitting: Internal Medicine

## 2012-12-07 ENCOUNTER — Ambulatory Visit (INDEPENDENT_AMBULATORY_CARE_PROVIDER_SITE_OTHER): Payer: Medicare Other | Admitting: *Deleted

## 2012-12-07 DIAGNOSIS — Z7901 Long term (current) use of anticoagulants: Secondary | ICD-10-CM

## 2012-12-07 LAB — POCT INR: INR: 1.7

## 2012-12-10 ENCOUNTER — Other Ambulatory Visit: Payer: Self-pay | Admitting: Family Medicine

## 2012-12-11 DIAGNOSIS — H353290 Exudative age-related macular degeneration, unspecified eye, stage unspecified: Secondary | ICD-10-CM | POA: Diagnosis not present

## 2012-12-13 ENCOUNTER — Encounter: Payer: Self-pay | Admitting: Internal Medicine

## 2012-12-14 ENCOUNTER — Ambulatory Visit: Payer: Medicare Other

## 2012-12-14 ENCOUNTER — Other Ambulatory Visit: Payer: Self-pay | Admitting: Family Medicine

## 2012-12-20 DIAGNOSIS — H353290 Exudative age-related macular degeneration, unspecified eye, stage unspecified: Secondary | ICD-10-CM | POA: Diagnosis not present

## 2012-12-20 DIAGNOSIS — H16209 Unspecified keratoconjunctivitis, unspecified eye: Secondary | ICD-10-CM | POA: Diagnosis not present

## 2012-12-20 DIAGNOSIS — T1500XA Foreign body in cornea, unspecified eye, initial encounter: Secondary | ICD-10-CM | POA: Diagnosis not present

## 2012-12-22 ENCOUNTER — Other Ambulatory Visit: Payer: Self-pay | Admitting: Family Medicine

## 2012-12-26 ENCOUNTER — Telehealth: Payer: Self-pay

## 2012-12-26 NOTE — Telephone Encounter (Signed)
Letter faxed to [employer] of [place]

## 2013-01-08 ENCOUNTER — Ambulatory Visit (INDEPENDENT_AMBULATORY_CARE_PROVIDER_SITE_OTHER): Payer: Medicare Other | Admitting: *Deleted

## 2013-01-08 DIAGNOSIS — Z7901 Long term (current) use of anticoagulants: Secondary | ICD-10-CM

## 2013-01-10 DIAGNOSIS — B029 Zoster without complications: Secondary | ICD-10-CM | POA: Diagnosis not present

## 2013-01-10 DIAGNOSIS — B079 Viral wart, unspecified: Secondary | ICD-10-CM | POA: Diagnosis not present

## 2013-01-10 DIAGNOSIS — L821 Other seborrheic keratosis: Secondary | ICD-10-CM | POA: Diagnosis not present

## 2013-01-15 DIAGNOSIS — H353290 Exudative age-related macular degeneration, unspecified eye, stage unspecified: Secondary | ICD-10-CM | POA: Diagnosis not present

## 2013-01-19 ENCOUNTER — Other Ambulatory Visit: Payer: Self-pay | Admitting: Family Medicine

## 2013-01-30 ENCOUNTER — Other Ambulatory Visit: Payer: Self-pay | Admitting: Family Medicine

## 2013-01-30 DIAGNOSIS — B009 Herpesviral infection, unspecified: Secondary | ICD-10-CM | POA: Diagnosis not present

## 2013-01-30 DIAGNOSIS — L723 Sebaceous cyst: Secondary | ICD-10-CM | POA: Diagnosis not present

## 2013-01-31 DIAGNOSIS — H353290 Exudative age-related macular degeneration, unspecified eye, stage unspecified: Secondary | ICD-10-CM | POA: Diagnosis not present

## 2013-02-01 ENCOUNTER — Other Ambulatory Visit: Payer: Self-pay | Admitting: Family Medicine

## 2013-02-01 MED ORDER — WARFARIN SODIUM 1 MG PO TABS: ORAL_TABLET | ORAL | Status: DC

## 2013-02-04 ENCOUNTER — Other Ambulatory Visit: Payer: Self-pay | Admitting: Family Medicine

## 2013-02-06 ENCOUNTER — Encounter: Payer: Self-pay | Admitting: Internal Medicine

## 2013-02-07 ENCOUNTER — Other Ambulatory Visit: Payer: Self-pay | Admitting: Family Medicine

## 2013-02-07 MED ORDER — WARFARIN SODIUM 1 MG PO TABS: ORAL_TABLET | ORAL | Status: DC

## 2013-02-11 ENCOUNTER — Encounter: Payer: Self-pay | Admitting: Family Medicine

## 2013-02-11 ENCOUNTER — Ambulatory Visit (INDEPENDENT_AMBULATORY_CARE_PROVIDER_SITE_OTHER): Payer: Medicare Other | Admitting: *Deleted

## 2013-02-11 ENCOUNTER — Ambulatory Visit (INDEPENDENT_AMBULATORY_CARE_PROVIDER_SITE_OTHER): Payer: Medicare Other | Admitting: Family Medicine

## 2013-02-11 VITALS — BP 108/71 | HR 76 | Temp 97.3°F | Wt 173.4 lb

## 2013-02-11 DIAGNOSIS — D509 Iron deficiency anemia, unspecified: Secondary | ICD-10-CM | POA: Diagnosis not present

## 2013-02-11 DIAGNOSIS — I4891 Unspecified atrial fibrillation: Secondary | ICD-10-CM

## 2013-02-11 DIAGNOSIS — G3184 Mild cognitive impairment, so stated: Secondary | ICD-10-CM | POA: Diagnosis not present

## 2013-02-11 DIAGNOSIS — J449 Chronic obstructive pulmonary disease, unspecified: Secondary | ICD-10-CM | POA: Diagnosis not present

## 2013-02-11 DIAGNOSIS — I10 Essential (primary) hypertension: Secondary | ICD-10-CM | POA: Diagnosis not present

## 2013-02-11 DIAGNOSIS — R252 Cramp and spasm: Secondary | ICD-10-CM | POA: Diagnosis not present

## 2013-02-11 DIAGNOSIS — R7989 Other specified abnormal findings of blood chemistry: Secondary | ICD-10-CM | POA: Diagnosis not present

## 2013-02-11 DIAGNOSIS — Z7901 Long term (current) use of anticoagulants: Secondary | ICD-10-CM | POA: Diagnosis not present

## 2013-02-11 DIAGNOSIS — I480 Paroxysmal atrial fibrillation: Secondary | ICD-10-CM

## 2013-02-11 LAB — BASIC METABOLIC PANEL WITH GFR
BUN: 40 mg/dL — ABNORMAL HIGH (ref 6–23)
CO2: 28 meq/L (ref 19–32)
Calcium: 9.2 mg/dL (ref 8.4–10.5)
Chloride: 101 meq/L (ref 96–112)
Creat: 1.61 mg/dL — ABNORMAL HIGH (ref 0.50–1.10)
Glucose, Bld: 121 mg/dL — ABNORMAL HIGH (ref 70–99)
Potassium: 3.8 meq/L (ref 3.5–5.3)
Sodium: 141 meq/L (ref 135–145)

## 2013-02-11 LAB — MAGNESIUM: Magnesium: 1.9 mg/dL (ref 1.5–2.5)

## 2013-02-11 LAB — POCT INR: INR: 2

## 2013-02-12 ENCOUNTER — Encounter: Payer: Self-pay | Admitting: Family Medicine

## 2013-02-12 NOTE — Progress Notes (Signed)
Subjective:    Patient ID: [name], female    DOB: June 20, 1923, 79 y.o.   MRN: [id number]  HPI Problem List Items Addressed This Visit     Cardiovascular and Mediastinum   HYPERTENSION, BENIGN ESSENTIAL (Chronic)  Disease Monitoring  Blood pressure range: not checking at home  Chest pain: no   Dyspnea: yes, but at baseline   Claudication: no, but had cramping pain last night in right leg  Medication compliance: yes, and has been taking Lasix 40 mg three times a week Medication Side Effects  Lightheadedness: no   Urinary frequency: no   Edema: no    Preventitive Healthcare:  Exercise: no   Diet Pattern: Watching her weight       AF (paroxysmal atrial fibrillation) (Chronic) - Onset several years ago.  Started Warfarin in Spring of this year.  - Pt taking her Warfarin and metoprolol - No bleeding, no melena or hematochezia. - No palpitations. - No increase in her usual DOE     Respiratory   COPD (Chronic) - Onset years ago - Clinical COPD Questionnaire On average, during the past week, how ofter did you feel:(Never = 0, Hardly ever = 1, a few times = 2, several times = 3, many times = 4, a great many times = 5, almost all the time = 6)  1.  Short of Breath at rest? 1 2.  Short of breath doing physical activities? 1 3.  Concerned about getting a cold or breathing getting worse? 1 4. Depressed (down) because of your breathing problems?  1  In general , during the past week, how much of the time:  5.  Did you cough? 1 6. Did you produce phlegm?  1  On average, during the past week, how limited were you in these activities because of your breathing problems:  7. Strenuous physical activities, e.g.,  Climbing stairs, hurrying, doing sports? 2 8. Moderate physical activities, e.g., walking, housework, carrying things?  2 9.  Daily activities at home, e.g., dressing, washing yourself?  0 10.  Social activities, e.g., talking, being with children, visiting friends  or relatives?  1  Total score = 11     Genitourinary   CHRONIC KIDNEY DISEASE STAGE III (MODERATE) (Chronic) - Longstanding issue.  Solitary kidney (S/P surgery years ago) - Avoids NSAIDS - No hematuria. - Has been taking Lasix 40 mg tablets three times a week - Not taking Calcium or Vitamin D.  - History of Anemia, iron deficient on oral iron daily.      Other   Mild cognitive impairment - Failed MiniCog last visit in August - No decline in IADLs. Still driving and handling her own finances without problems.    Iron deficiency anemia - Present for over a year - continues to take iron supplements daily with only mild nausea and constipation - No syncope/near syncope.  No palpitations, No increase in DOE above baseline    Other Visit Diagnoses   Cramps, extremity    -  Primary - Onset last night during sleep - Entire left leg involves - Lasted 10 minutes - resolved spontaneously - History of leg cramps, but none this severe - Has been taking lasix 40 mg tablet about three times a day for  Chronic intermittent leg swelling.  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  - No fever, no erythema of leg, no current pain in left leg, no joint swelling.  Relevant Orders       Basic Metabolic Panel (Completed)       Magnesium (Completed)          Review of Systems See HPI     Objective:   Physical Exam VS reviewed GEN: Alert, Cooperative, Groomed, NAD HEENT: PERRL; EAC bilaterally not occluded, COR: RRR, No M/G/R, No JVD, Normal PMI size and location LUNGS: BCTA, No Acc mm use, speaking in full sentences ABDOMEN: (+)BS, soft, NT, ND, No HSM, No palpable masses EXT: Trace peripheral pretibial leg edema bilaterally. Palpable bilateral pedal pulses.  Neuro: Sensation: Intact grossly to touc hbilateral feet; DTR:, Bilateral Knees 2+, Bilateral Ankles 1+ Gait: Normal speed, No significant path deviation, Step through +,  Psych: Normal  affect/thought/speech/language    Assessment & Plan:

## 2013-02-13 NOTE — Assessment & Plan Note (Signed)
Currently asymptomatic. Taking her daily iron supplement. Will check CBC and serum Ferritin level on [facility] visit 12/22

## 2013-02-13 NOTE — Assessment & Plan Note (Signed)
Need to remind patient that she is suppose to be taking Vitamin D supplement/

## 2013-02-13 NOTE — Assessment & Plan Note (Signed)
Lab Results  Component Value Date   CREATININE 1.61* 02/11/2013   BUN 40* 02/11/2013   NA 141 02/11/2013   K 3.8 02/11/2013   CL 101 02/11/2013   CO2 28 02/11/2013   Suspect the rise in Creatinine secondary to Lasix therapy.  Asked patient to stop the Lasix and return to clinic on 12/22 for monitoring BMET to assess response to change in treatment.

## 2013-02-13 NOTE — Assessment & Plan Note (Signed)
Lower SBP today. Asymptomatic. Recommended stopping Lasix. Nurse visit on 12/22 for recheck of BP.

## 2013-02-13 NOTE — Assessment & Plan Note (Signed)
Regular rhythm and rate today. INR 2.0 on Warfarin 2 mg daily (14 mg total weekly) On metoprolol 50 mg XL daily.  BP low today but suspect it is secondary to patient's lasix use rather than metoprolol therapy.

## 2013-02-13 NOTE — Assessment & Plan Note (Signed)
Lab Results  Component Value Date   CREATININE 1.61* 02/11/2013   BUN 40* 02/11/2013   NA 141 02/11/2013   K 3.8 02/11/2013   CL 101 02/11/2013   CO2 28 02/11/2013   Rise in serum creatinine.  Suspect secondary to use of Lasix. Recommend stopping Lasix.  Pt to return on 12/22 for BMET to assess response to stopping diuretic.

## 2013-02-13 NOTE — Assessment & Plan Note (Signed)
No recent COPD exacerbations. Stable on current LABD and ICS. Continue current management

## 2013-02-18 ENCOUNTER — Other Ambulatory Visit: Payer: Medicare Other

## 2013-02-18 DIAGNOSIS — R7989 Other specified abnormal findings of blood chemistry: Secondary | ICD-10-CM

## 2013-02-18 DIAGNOSIS — D509 Iron deficiency anemia, unspecified: Secondary | ICD-10-CM

## 2013-02-18 LAB — CBC
HCT: 38.6 % (ref 36.0–46.0)
Hemoglobin: 13 g/dL (ref 12.0–15.0)
MCH: 31.2 pg (ref 26.0–34.0)
MCHC: 33.7 g/dL (ref 30.0–36.0)
MCV: 92.6 fL (ref 78.0–100.0)
Platelets: 256 K/uL (ref 150–400)
RBC: 4.17 MIL/uL (ref 3.87–5.11)
RDW: 14.6 % (ref 11.5–15.5)
WBC: 5.7 K/uL (ref 4.0–10.5)

## 2013-02-18 LAB — FERRITIN: Ferritin: 174 ng/mL (ref 10–291)

## 2013-02-18 LAB — BASIC METABOLIC PANEL
Calcium: 9.2 mg/dL (ref 8.4–10.5)
Chloride: 106 mEq/L (ref 96–112)
Potassium: 4.2 mEq/L (ref 3.5–5.3)
Sodium: 141 mEq/L (ref 135–145)

## 2013-02-18 NOTE — Progress Notes (Signed)
BMP,CBC,FERRITIN DONE TODAY [name]

## 2013-02-19 ENCOUNTER — Telehealth: Payer: Self-pay | Admitting: Family Medicine

## 2013-02-19 DIAGNOSIS — H353290 Exudative age-related macular degeneration, unspecified eye, stage unspecified: Secondary | ICD-10-CM | POA: Diagnosis not present

## 2013-02-19 DIAGNOSIS — R7989 Other specified abnormal findings of blood chemistry: Secondary | ICD-10-CM

## 2013-02-19 DIAGNOSIS — D509 Iron deficiency anemia, unspecified: Secondary | ICD-10-CM

## 2013-02-19 NOTE — Assessment & Plan Note (Signed)
Lab Results  Component Value Date   WBC 5.7 02/18/2013   HGB 13.0 02/18/2013   HCT 38.6 02/18/2013   MCV 92.6 02/18/2013   PLT 256 02/18/2013   Lab Results  Component Value Date   FERRITIN 174 02/18/2013   Adequate iron replacement.  Stable Hemoglobin. Recommend reducing iron tablet to one a day.

## 2013-02-19 NOTE — Assessment & Plan Note (Signed)
Lab Results  Component Value Date   CREATININE 1.06 02/18/2013   BUN 25* 02/18/2013   NA 141 02/18/2013   K 4.2 02/18/2013   CL 106 02/18/2013   CO2 24 02/18/2013  I spoke with Ria Bush dgt.  I let her know that [name] know that her kidney function is back to normal, as is her iron levels and hemoglobin.  Recommend using the Lasix very infrequently and she may reduce her iron tablet to one a day.

## 2013-02-19 NOTE — Telephone Encounter (Signed)
I spoke with [name] dgt.  I let her know that [name] know that her kidney function is back to normal, as is her iron levels and hemoglobin.  Recommend using the Lasix very infrequently and she may reduce her iron tablet to one a day.

## 2013-02-20 ENCOUNTER — Encounter: Payer: Medicare Other | Admitting: Internal Medicine

## 2013-02-26 ENCOUNTER — Other Ambulatory Visit: Payer: Self-pay | Admitting: Family Medicine

## 2013-02-27 ENCOUNTER — Encounter: Payer: Self-pay | Admitting: *Deleted

## 2013-03-11 ENCOUNTER — Ambulatory Visit: Payer: Medicare Other

## 2013-03-12 ENCOUNTER — Encounter: Payer: Medicare Other | Admitting: Internal Medicine

## 2013-03-12 ENCOUNTER — Telehealth: Payer: Self-pay | Admitting: Family Medicine

## 2013-03-12 NOTE — Telephone Encounter (Signed)
Pt states that she always has a rx for z-pak and finished it yesterday.  She still have another one she can take but her pharmacist informed her that she might need something stronger.  Advised pt that she would need to be seen in clinic for evaluation.  States that she is too weak to come in and be seen.  I offered her an appt for tomorrow if need be.  Please advise.  Would like you to call Dr. McDiarmid for this approval. Johnney Ou

## 2013-03-12 NOTE — Telephone Encounter (Signed)
Productive cough with yellow mucus.  Pt made an appt to see Dr. Gwendlyn Deutscher tomorrow for same day. Dhaval Woo,CMA

## 2013-03-12 NOTE — Telephone Encounter (Signed)
Will forward to Dr. Adrian Blackwater since she is precepting and dr. Lindell Noe is in clinic today. Jazmin Hartsell,CMA

## 2013-03-12 NOTE — Telephone Encounter (Signed)
Please call patient back.  What are her symptoms other than fatigue. It is not clinic policy to prescribe antibiotics over the phone without evaluating patients.   If she is not willing or able to come in an option is for the [facility] team to make a home visit on Thursday AM.    Dr. McDiarmid  is not currently available.

## 2013-03-12 NOTE — Telephone Encounter (Signed)
Pt called and she is still sick. She has been on the Z-pak and it is not helping. She is wondering if she can get something else or does she need to come in. She has a history of this turning into Pneumonia. She really would like to have something stronger called. She would like Dr. Lindell Noe to call her if possible. jw

## 2013-03-12 NOTE — Telephone Encounter (Signed)
Please call patient:  There is no record of an evaluation or azithromycin being prescribed for this acute illness. Yes she should be schedule for a visit if she is requiring antibiotic therapy, preferably today if febrile and significantly symptomatic. Tomorrow with CC is also possible.

## 2013-03-13 ENCOUNTER — Observation Stay (HOSPITAL_COMMUNITY)
Admission: EM | Admit: 2013-03-13 | Discharge: 2013-03-14 | Disposition: A | Discharge status: Home / Self Care | Payer: Medicare Other | Attending: Family Medicine | Admitting: Family Medicine

## 2013-03-13 ENCOUNTER — Other Ambulatory Visit: Payer: Self-pay

## 2013-03-13 ENCOUNTER — Ambulatory Visit (INDEPENDENT_AMBULATORY_CARE_PROVIDER_SITE_OTHER): Payer: Medicare Other | Admitting: Family Medicine

## 2013-03-13 ENCOUNTER — Encounter: Payer: Self-pay | Admitting: Family Medicine

## 2013-03-13 ENCOUNTER — Encounter (HOSPITAL_COMMUNITY): Payer: Self-pay | Admitting: Emergency Medicine

## 2013-03-13 ENCOUNTER — Emergency Department (HOSPITAL_COMMUNITY): Payer: Medicare Other

## 2013-03-13 ENCOUNTER — Ambulatory Visit: Payer: Medicare Other | Admitting: Family Medicine

## 2013-03-13 VITALS — BP 151/86 | HR 105 | Temp 98.0°F | Wt 174.0 lb

## 2013-03-13 DIAGNOSIS — I2510 Atherosclerotic heart disease of native coronary artery without angina pectoris: Secondary | ICD-10-CM | POA: Insufficient documentation

## 2013-03-13 DIAGNOSIS — I129 Hypertensive chronic kidney disease with stage 1 through stage 4 chronic kidney disease, or unspecified chronic kidney disease: Secondary | ICD-10-CM | POA: Insufficient documentation

## 2013-03-13 DIAGNOSIS — G3184 Mild cognitive impairment, so stated: Secondary | ICD-10-CM | POA: Insufficient documentation

## 2013-03-13 DIAGNOSIS — I495 Sick sinus syndrome: Secondary | ICD-10-CM | POA: Diagnosis not present

## 2013-03-13 DIAGNOSIS — I10 Essential (primary) hypertension: Secondary | ICD-10-CM | POA: Diagnosis not present

## 2013-03-13 DIAGNOSIS — I509 Heart failure, unspecified: Secondary | ICD-10-CM | POA: Insufficient documentation

## 2013-03-13 DIAGNOSIS — I252 Old myocardial infarction: Secondary | ICD-10-CM | POA: Diagnosis not present

## 2013-03-13 DIAGNOSIS — N183 Chronic kidney disease, stage 3 unspecified: Secondary | ICD-10-CM | POA: Diagnosis not present

## 2013-03-13 DIAGNOSIS — Z950 Presence of cardiac pacemaker: Secondary | ICD-10-CM | POA: Diagnosis not present

## 2013-03-13 DIAGNOSIS — J449 Chronic obstructive pulmonary disease, unspecified: Secondary | ICD-10-CM | POA: Diagnosis not present

## 2013-03-13 DIAGNOSIS — Z9861 Coronary angioplasty status: Secondary | ICD-10-CM | POA: Insufficient documentation

## 2013-03-13 DIAGNOSIS — R059 Cough, unspecified: Secondary | ICD-10-CM

## 2013-03-13 DIAGNOSIS — Z7901 Long term (current) use of anticoagulants: Secondary | ICD-10-CM

## 2013-03-13 DIAGNOSIS — J441 Chronic obstructive pulmonary disease with (acute) exacerbation: Secondary | ICD-10-CM | POA: Diagnosis not present

## 2013-03-13 DIAGNOSIS — I4891 Unspecified atrial fibrillation: Secondary | ICD-10-CM | POA: Diagnosis not present

## 2013-03-13 DIAGNOSIS — R0902 Hypoxemia: Secondary | ICD-10-CM

## 2013-03-13 DIAGNOSIS — R05 Cough: Secondary | ICD-10-CM

## 2013-03-13 DIAGNOSIS — I480 Paroxysmal atrial fibrillation: Secondary | ICD-10-CM

## 2013-03-13 DIAGNOSIS — Z96659 Presence of unspecified artificial knee joint: Secondary | ICD-10-CM | POA: Diagnosis not present

## 2013-03-13 DIAGNOSIS — Q602 Renal agenesis, unspecified: Secondary | ICD-10-CM | POA: Insufficient documentation

## 2013-03-13 DIAGNOSIS — J984 Other disorders of lung: Secondary | ICD-10-CM | POA: Diagnosis not present

## 2013-03-13 DIAGNOSIS — Q605 Renal hypoplasia, unspecified: Secondary | ICD-10-CM

## 2013-03-13 LAB — CBC WITH DIFFERENTIAL/PLATELET
Basophils Absolute: 0 10*3/uL (ref 0.0–0.1)
Basophils Relative: 1 % (ref 0–1)
EOS ABS: 0.4 10*3/uL (ref 0.0–0.7)
Eosinophils Relative: 6 % — ABNORMAL HIGH (ref 0–5)
HEMATOCRIT: 39.3 % (ref 36.0–46.0)
HEMOGLOBIN: 13 g/dL (ref 12.0–15.0)
LYMPHS ABS: 1.1 10*3/uL (ref 0.7–4.0)
Lymphocytes Relative: 16 % (ref 12–46)
MCH: 31.6 pg (ref 26.0–34.0)
MCHC: 33.1 g/dL (ref 30.0–36.0)
MCV: 95.6 fL (ref 78.0–100.0)
MONOS PCT: 10 % (ref 3–12)
Monocytes Absolute: 0.7 10*3/uL (ref 0.1–1.0)
Neutro Abs: 4.6 10*3/uL (ref 1.7–7.7)
Neutrophils Relative %: 67 % (ref 43–77)
Platelets: 241 10*3/uL (ref 150–400)
RBC: 4.11 MIL/uL (ref 3.87–5.11)
RDW: 13.2 % (ref 11.5–15.5)
WBC: 6.8 10*3/uL (ref 4.0–10.5)

## 2013-03-13 LAB — BASIC METABOLIC PANEL
BUN: 27 mg/dL — AB (ref 6–23)
CO2: 24 mEq/L (ref 19–32)
Calcium: 9.4 mg/dL (ref 8.4–10.5)
Chloride: 102 mEq/L (ref 96–112)
Creatinine, Ser: 1.34 mg/dL — ABNORMAL HIGH (ref 0.50–1.10)
GFR calc Af Amer: 40 mL/min — ABNORMAL LOW (ref >90)
GFR calc non Af Amer: 35 mL/min — ABNORMAL LOW (ref >90)
GLUCOSE: 106 mg/dL — AB (ref 70–99)
POTASSIUM: 3.9 meq/L (ref 3.7–5.3)
Sodium: 141 mEq/L (ref 137–147)

## 2013-03-13 LAB — PRO B NATRIURETIC PEPTIDE: Pro B Natriuretic peptide (BNP): 1642 pg/mL — ABNORMAL HIGH (ref 0–450)

## 2013-03-13 LAB — PROTIME-INR
INR: 1.5 — ABNORMAL HIGH (ref 0.00–1.49)
PROTHROMBIN TIME: 17.7 s — AB (ref 11.6–15.2)

## 2013-03-13 LAB — TROPONIN I: Troponin I: <0.3 ng/mL (ref <0.30)

## 2013-03-13 MED ORDER — SODIUM CHLORIDE 0.9 % IV SOLN
INTRAVENOUS | Status: DC
  Administered 2013-03-13: 19:00:00 via INTRAVENOUS

## 2013-03-13 MED ORDER — IPRATROPIUM-ALBUTEROL 0.5-2.5 (3) MG/3ML IN SOLN: 3.0000 mL | RESPIRATORY_TRACT | Status: DC | PRN

## 2013-03-13 MED ORDER — WARFARIN SODIUM 1 MG PO TABS
1.0000 mg | ORAL_TABLET | Freq: Once | ORAL | Status: DC
  Filled 2013-03-13: qty 1

## 2013-03-13 MED ORDER — WARFARIN - PHARMACIST DOSING INPATIENT: Freq: Every day | Status: DC

## 2013-03-13 MED ORDER — PREDNISONE 50 MG PO TABS
50.0000 mg | ORAL_TABLET | Freq: Every day | ORAL | Status: DC
  Administered 2013-03-14: 50 mg via ORAL
  Filled 2013-03-13 (×2): qty 1

## 2013-03-13 MED ORDER — IPRATROPIUM BROMIDE 0.02 % IN SOLN
0.5000 mg | Freq: Once | RESPIRATORY_TRACT | Status: AC
  Administered 2013-03-13: 0.5 mg via RESPIRATORY_TRACT

## 2013-03-13 MED ORDER — METHYLPREDNISOLONE SODIUM SUCC 125 MG IJ SOLR
125.0000 mg | Freq: Once | INTRAMUSCULAR | Status: AC
  Administered 2013-03-13: 125 mg via INTRAVENOUS
  Filled 2013-03-13: qty 2

## 2013-03-13 MED ORDER — IPRATROPIUM-ALBUTEROL 0.5-2.5 (3) MG/3ML IN SOLN
3.0000 mL | RESPIRATORY_TRACT | Status: DC
  Administered 2013-03-14 (×2): 3 mL via RESPIRATORY_TRACT
  Filled 2013-03-13 (×3): qty 3

## 2013-03-13 MED ORDER — SODIUM CHLORIDE 0.45 % IV SOLN
INTRAVENOUS | Status: AC
  Administered 2013-03-14: 01:00:00 via INTRAVENOUS

## 2013-03-13 MED ORDER — ALBUTEROL SULFATE (2.5 MG/3ML) 0.083% IN NEBU
2.5000 mg | INHALATION_SOLUTION | Freq: Once | RESPIRATORY_TRACT | Status: AC
  Administered 2013-03-13: 2.5 mg via RESPIRATORY_TRACT

## 2013-03-13 MED ORDER — ALBUTEROL (5 MG/ML) CONTINUOUS INHALATION SOLN
10.0000 mg/h | INHALATION_SOLUTION | Freq: Once | RESPIRATORY_TRACT | Status: AC
  Administered 2013-03-13: 10 mg/h via RESPIRATORY_TRACT
  Filled 2013-03-13: qty 20

## 2013-03-13 MED ORDER — LEVOFLOXACIN 750 MG PO TABS
750.0000 mg | ORAL_TABLET | ORAL | Status: DC
  Administered 2013-03-14: 750 mg via ORAL
  Filled 2013-03-13: qty 1

## 2013-03-13 NOTE — ED Notes (Signed)
Pt transported to radiology.

## 2013-03-13 NOTE — ED Provider Notes (Signed)
CSN: [id number]     Arrival date & time 03/13/13  1727 History   First MD Initiated Contact with Patient 03/13/13 1822     Chief Complaint  Patient presents with  . Shortness of Breath   (Consider location/radiation/quality/duration/timing/severity/associated sxs/prior Treatment) Patient is a 80 y.o. female presenting with shortness of breath. The history is provided by the patient and a relative.  Shortness of Breath  patient here complaining of shortness of breath and cough times one to 2 weeks. Cough has been productive. She has used her home nebulizer with temporary relief. Denies any anginal type chest pain. She does have a history of COPD. She just recently finished a Z-Pak. Saw her Dr. today was found to be hypoxic with a pulse oximetry at room air in the high 80 percentile. She was given albuterol treatment in the office but remained hypoxic and was sent her for further treatment. She denies any syncope or near-syncope. No vomiting or diarrhea. Symptoms are worse with exertion better with rest. Denies any lower extremity edema.  Past Medical History  Diagnosis Date  . Candidiasis of the esophagus 11/26/2007  . Myocardial infarct, old   . COPD, severe   . Sick sinus syndrome with tachycardia   . Pacemaker   . Chronic kidney disease (CKD), stage III (moderate)   . Hypertension   . Acute appendicitis with rupture   . Spinal stenosis, lumbar   . Adrenal adenoma     Incidentaloma  . Spondylolisthesis of lumbar region   . Lumbar herniated disc     History of HNP L4/5 in 2003  . Hx of colonoscopy with polypectomy 04/27/2010    Dr Cristina Gong found three  tubular adenomas each less than 10 mm size  . Retinal hemorrhage of left eye 06/2010  . Cataract 2013    Bilateral   . Abnormal mammogram, unspecified 08/23/2010    Followup imaging reassuring.  Repeat in 6 months.   Marland Kitchen DISC WITH RADICULOPATHY 04/27/2006    Qualifier: Diagnosis of  By: McDiarmid MD, Sherren Mocha    . Transitional cell carcinoma  of ureter, history   . Mild cognitive impairment 10/26/2012    (10/25/12) Failed MiniCog screen  . 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 Normal uric acid serum level at time of attack.    Marland Kitchen COPD (chronic obstructive pulmonary disease)    Past Surgical History  Procedure Laterality Date  . Pacemaker insertion      Dr Lovena Le ([facility])  . Nephrectomy  For transition cell cancer     Dr Tresa Endo, surgeon  . Replacement total knee  2009, Right knee    Dr Wynelle Link  . Replacement total knee  05/2009, Left knee    Dr Wynelle Link  . Knee arthroscopy w/ synovectomy  11/2009, left knee    Dr Wynelle Link  . Cholecystectomy    . Esophagogastroduodenoscopy  04/27/2010    Dr Cristina Gong - found transient H/H & Schatzki's ring  . Esophagogastroduodenoscopy endoscopy  03/06/2012    Dr Cristina Gong - found non-obstuctive Schatzki's ring at [facility]. o/w normal EGD.    No family history on file. History  Substance Use Topics  . Smoking status: Former Smoker    Types: Cigarettes  . Smokeless tobacco: Never Used  . Alcohol Use: 3.0 oz/week    6 drink(s) per week     Comment: 4-5 glasses of wine per week   OB  History   Grav Para Term Preterm Abortions TAB SAB Ect Mult Living                 Review of Systems  Respiratory: Positive for shortness of breath.   All other systems reviewed and are negative.    Allergies  Codeine phosphate; Hydrochlorothiazide; and Montelukast sodium  Home Medications   Current Outpatient Rx  Name  Route  Sig  Dispense  Refill  . albuterol (PROVENTIL) (2.5 MG/3ML) 0.083% nebulizer solution   Nebulization   Take 2.5 mg by nebulization every 6 (six) hours as needed for wheezing or shortness of breath.         Marland Kitchen albuterol (VENTOLIN HFA) 108 (90 BASE) MCG/ACT inhaler   Inhalation   Inhale 2 puffs into the lungs every 6 (six) hours as needed. If needed for shortness of breath.   1 Inhaler    3   . cyclobenzaprine (FLEXERIL) 5 MG tablet   Oral   Take 5 mg by mouth 3 (three) times daily as needed for muscle spasms.         . ferrous sulfate 325 (65 FE) MG tablet   Oral   Take 325 mg by mouth daily.         . Fluticasone-Salmeterol (ADVAIR DISKUS) 250-50 MCG/DOSE AEPB   Inhalation   Inhale 1 puff into the lungs 2 (two) times daily as needed (shortness of breath).          . furosemide (LASIX) 40 MG tablet   Oral   Take 40 mg by mouth daily as needed for fluid.         . metoprolol succinate (TOPROL-XL) 50 MG 24 hr tablet   Oral   Take 50 mg by mouth daily.          . nitroGLYCERIN (NITROSTAT) 0.4 MG SL tablet   Sublingual   Place 1 tablet (0.4 mg total) under the tongue every 5 (five) minutes as needed.   30 tablet   1   . omeprazole (PRILOSEC) 40 MG capsule   Oral   Take 40 mg by mouth daily.          . simvastatin (ZOCOR) 40 MG tablet   Oral   Take 40 mg by mouth daily.         Marland Kitchen tiotropium (SPIRIVA) 18 MCG inhalation capsule   Inhalation   Place 1 capsule (18 mcg total) into inhaler and inhale daily.   30 capsule   PRN   . vitamin B-12 (CYANOCOBALAMIN) 1000 MCG tablet   Oral   Take 1,000 mcg by mouth daily.          Marland Kitchen warfarin (COUMADIN) 1 MG tablet      As directed   60 tablet   1    BP 137/70  Pulse 83  Temp(Src) 98.2 F (36.8 C)  Resp 20  Ht 5\' 4"  (1.626 m)  Wt 174 lb (78.926 kg)  BMI 29.85 kg/m2  SpO2 93% Physical Exam  Nursing note and vitals reviewed. Constitutional: She is oriented to person, place, and time. She appears well-developed and well-nourished.  Non-toxic appearance. No distress.  HENT:  Head: Normocephalic and atraumatic.  Eyes: Conjunctivae, EOM and lids are normal. Pupils are equal, round, and reactive to light.  Neck: Normal range of motion. Neck supple. No tracheal deviation present. No mass present.  Cardiovascular: Normal rate, regular rhythm and normal heart sounds.  Exam reveals no gallop.    No murmur heard.  Pulmonary/Chest: Effort normal. No stridor. No respiratory distress. She has decreased breath sounds. She has wheezes. She has no rhonchi. She has no rales.  Abdominal: Soft. Normal appearance and bowel sounds are normal. She exhibits no distension. There is no tenderness. There is no rebound and no CVA tenderness.  Musculoskeletal: Normal range of motion. She exhibits no edema and no tenderness.  Neurological: She is alert and oriented to person, place, and time. She has normal strength. No cranial nerve deficit or sensory deficit. GCS eye subscore is 4. GCS verbal subscore is 5. GCS motor subscore is 6.  Skin: Skin is warm and dry. No abrasion and no rash noted.  Psychiatric: She has a normal mood and affect. Her speech is normal and behavior is normal.    ED Course  Procedures (including critical care time) Labs Review Labs Reviewed  CBC WITH DIFFERENTIAL  BASIC METABOLIC PANEL  PRO B NATRIURETIC PEPTIDE  TROPONIN I  PROTIME-INR   Imaging Review No results found.  EKG Interpretation   None       MDM  No diagnosis found. Patient given continuous treatment here of albuterol along with Solu-Medrol. Patient will require admission for treatment of her COPD    Leota Jacobsen, MD 03/13/13 1932

## 2013-03-13 NOTE — ED Notes (Signed)
Pt sitting up in bed eating dinner brought by family. No distress noted.

## 2013-03-13 NOTE — ED Notes (Signed)
The pt came from the clinic.  Cough  Breathing difficulty for 1-2 weeks.  She has hhns at home that do not appears to be helping

## 2013-03-13 NOTE — Progress Notes (Signed)
ANTICOAGULATION CONSULT NOTE - Initial Consult  Pharmacy Consult for Coumadin Indication: atrial fibrillation  Allergies  Allergen Reactions  . Codeine Phosphate Itching    REACTION: unspecified  . Hydrochlorothiazide Other (See Comments)    Possible acute gouty arthritis of wrist  . Montelukast Sodium     REACTION: unspecified    Patient Measurements: Height: 5\' 4"  (162.6 cm) Weight: 174 lb (78.926 kg) IBW/kg (Calculated) : 54.7  Vital Signs: Temp: 98.2 F (36.8 C) (01/14 1745) Temp src: Oral (01/14 1603) BP: 108/44 mmHg (01/14 2230) Pulse Rate: 77 (01/14 2230)  Labs:  Recent Labs  03/13/13 1844  HGB 13.0  HCT 39.3  PLT 241  LABPROT 17.7*  INR 1.50*  CREATININE 1.34*  TROPONINI <0.30    Estimated Creatinine Clearance: 30.1 ml/min (by C-G formula based on Cr of 1.34).   Medical History: Past Medical History  Diagnosis Date  . Candidiasis of the esophagus 11/26/2007  . Myocardial infarct, old   . COPD, severe   . Sick sinus syndrome with tachycardia   . Pacemaker   . Chronic kidney disease (CKD), stage III (moderate)   . Hypertension   . Acute appendicitis with rupture   . Spinal stenosis, lumbar   . Adrenal adenoma     Incidentaloma  . Spondylolisthesis of lumbar region   . Lumbar herniated disc     History of HNP L4/5 in 2003  . Hx of colonoscopy with polypectomy 04/27/2010    Dr Cristina Gong found three  tubular adenomas each less than 10 mm size  . Retinal hemorrhage of left eye 06/2010  . Cataract 2013    Bilateral   . Abnormal mammogram, unspecified 08/23/2010    Followup imaging reassuring.  Repeat in 6 months.   Marland Kitchen DISC WITH RADICULOPATHY 04/27/2006    Qualifier: Diagnosis of  By: McDiarmid MD, Sherren Mocha    . Transitional cell carcinoma of ureter, history   . Mild cognitive impairment 10/26/2012    (10/25/12) Failed MiniCog screen  . 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  Normal uric acid serum level at time of attack.    Marland Kitchen COPD (chronic obstructive pulmonary disease)     Medications:  ALbuterol  Flexeril  Iron  Advair  Lasix  Ntg  Prilosec  Zocor  Spiriva  Vit B12  Coumadin 1 mg daily (last dose 1/14)  Assessment: 80 yo female admitted with SOB, h/o Afib, to continue Coumadin  Goal of Therapy:  INR 2-3 Monitor platelets by anticoagulation protocol: Yes   Plan:  Coumadin 1 mg tonight Daily INR  Maytte Jacot, Bronson Curb 03/13/2013,11:31 PM

## 2013-03-13 NOTE — Patient Instructions (Signed)
Cough, Adult  A cough is a reflex. It helps you clear your throat and airways. A cough can help heal your body. A cough can last 2 or 3 weeks (acute) or may last more than 8 weeks (chronic). Some common causes of a cough can include an infection, allergy, or a cold. HOME CARE  Only take medicine as told by your doctor.  If given, take your medicines (antibiotics) as told. Finish them even if you start to feel better.  Use a cold steam vaporizer or humidier in your home. This can help loosen thick spit (secretions).  Sleep so you are almost sitting up (semi-upright). Use pillows to do this. This helps reduce coughing.  Rest as needed.  Stop smoking if you smoke. GET HELP RIGHT AWAY IF:  You have yellowish-white fluid (pus) in your thick spit.  Your cough gets worse.  Your medicine does not reduce coughing, and you are losing sleep.  You cough up blood.  You have trouble breathing.  Your pain gets worse and medicine does not help.  You have a fever. MAKE SURE YOU:   Understand these instructions.  Will watch your condition.  Will get help right away if you are not doing well or get worse. Document Released: 10/28/2010 Document Revised: 05/09/2011 Document Reviewed: 10/28/2010 Exit[employer] Patient Information 2014 [employer].  

## 2013-03-13 NOTE — Assessment & Plan Note (Addendum)
Bronchitis vs CAP vs COPD exacerbation. O2 sat 93% as per patient her normal is 97% RA. Albuterol treatment was given with no improvement of her O2 sat. Initially plan was to obtain chest xray and treat with Levaquin,but with newly low O2 sat will prefer she goes to the ED. Patient agreed with plan. Patient discussed with the ED triage nurse. I tried getting her [facility] to take her to the ED,but she declined,she stated she can drive herself to the ED. Patient advised to f/u as soon as d/c from the ED/Hospital if admitted.

## 2013-03-13 NOTE — ED Notes (Signed)
Per MD, pt is to be off O2 and as long as sats don't drop below 88% for prolonged periods of time pt can remain off O2.

## 2013-03-13 NOTE — H&P (Signed)
[facility] Admission History and Physical Service Pager: [phone]  Patient name: [name] Medical record number: [id number] Date of birth: September 19, 1923 Age: 80 y.o. Gender: female  Primary Care Provider: MCDIARMID,TODD D, MD Consultants: None Code Status: DNR/DNI  Chief Complaint: Dyspnea  Assessment and Plan: [name] is a 80 y.o. female presenting with COPD exacerbation. PMH is significant for COPD, HFpEF, MI  # Dyspnea - Most likely COPD Exacerbation but DDx includes HF exacerbation (BNP 1642 likely elevated due to CKD; clinically appear euvolemic) vs viral respiratory illness (denies myalgias) vs arrythmia (Hx of Afib; Not currently in Afib but rhythm irreg due to PVCs - Has pacemaker)  - Prednisone 50 mg x 10 days (Solumedrol 125 given in ED) - Levaquin: 750 every 48 hrs due to CrCl ~ 35 (Dose 1 or 3)  - Duonebs x 4 hrs/q2 prn (Recieved CAT x 1 hr in ED); Continue home Advair - [place] O2 to keep stat between 88-92% (Not on home O2) - CBC in am  # Cardiac: HFpEF, HTN, CAD s/p PCI, previous MI & tachy-brady syndrome s/p pacemaker (last evaluated 10/2012) - Denies current/recent CP, Orthopnea, or LE edema. EKG: NSR(paced) Qwaves V1&2, Twave inversion aVL, V4-6 which is unchanged from previous (12/2011). ECHO 2005 (EF 55-65%); Last Stress test ~ 9 months ago was normal (per Cards note 10/2012) - BNP 1642 likely elevated due to CKD; clinically appear euvolemic  - Holding home Lasix; which she takes prn (none in past week) - Troponin Neg. Will only check one as symptoms have been over a week.  - ASA 81 mg; Zocor 40 mg qd - INR 1.5: On long-term Coumadin for Afib; Warfarin per pharmacy  - BP stable on admission: continue home Metoprolol XL 50 qd  - Consider starting Lisinopril for CKD - Gentle hydration as below   # CKD stage 3b; solitary kidney - Baseline Cr ~ 1.3; GFR 35 - Cr 1.34 on admission; Stable @ baseline - Continue to monitor; BMET in  AM  FEN/GI: 1/2 NS @ 50cc x 10hrs Prophylaxis: Continue home Coumadin; home PPI  Disposition: Admit for observation; Attending Dr Nori Riis; Anticipate discharge home tomorrow if remain w/o O2 requirement   History of Present Illness: [name] is a 80 y.o. female presenting with productive cough x 3 weeks. She reports feeling tired, worn-out, having chills and not eating well since Christmas. She finished a Z-pack 2 days (has refills available at home for COPD exacerbations), but her cough has continued to get worse. She "doesn't walk much any more", but says her SOB with walking has gotten worse. Is able to walk to the bathroom (her norm) but has to walk slower. She takes warfarin "as a precaution." Denies any CP, and not on oxygen at home. Denies hospitalizations in the past 90 days, but reports ~ 4 abx courses for COPD in last year.   Review Of Systems: Per HPI with the following additions:  Otherwise 12 point review of systems was performed and was unremarkable.  Patient Active Problem List   Diagnosis Date Noted  . Cough 03/13/2013  . Azotemia 02/12/2013  . Mild cognitive impairment 10/26/2012  . Transitional cell carcinoma of ureter, history   . Chronic anticoagulation 10/01/2012  . Solar lentigo 06/15/2012  . Leg cramps 05/29/2012  . High risk medications (not anticoagulants) long-term use 03/05/2012  . Urge incontinence 12/13/2011  . Cervicalgia 07/05/2011  . AF (paroxysmal atrial fibrillation) 11/11/2010  . Vitamin D deficiency 11/02/2010  .  Vascular abnormality of conjunctiva of right eye 10/11/2010  . Macular degeneration, bilateral 10/04/2010    Class: Chronic  . Iron deficiency anemia 08/05/2010  . Solitary kidney, acquired 05/17/2010  . Spondylolisthesis of lumbar region   . 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 VITAMIN B12 DEFICIENCY 10/07/2009  . CHRONIC KIDNEY DISEASE STAGE III (MODERATE) 09/02/2009  . MYOCARDIAL INFARCTION, HX OF 09/25/2008  . VENTRICULAR  HYPERTROPHY, LEFT 08/28/2008  . SCHATZKI'S RING, HX OF 11/26/2007  . PREDIABETES 09/11/2007  . HYPERTENSION, BENIGN ESSENTIAL 06/18/2007  . ANXIETY 04/27/2006  . SICK SINUS SYNDROME 04/27/2006  . History of Hemorrhoids 04/27/2006  . EDEMA-LEGS,DUE TO VENOUS OBSTRUCT. 04/27/2006  . RHINITIS, ALLERGIC 04/27/2006  . COPD 04/27/2006  . GASTROESOPHAGEAL REFLUX, NO ESOPHAGITIS 04/27/2006  . HERNIA, HIATAL, NONCONGENITAL 04/27/2006  . OSTEOARTHRITIS, LOWER LEG 04/27/2006  . OSTEOARTHRITIS OF SPINE, NOS 04/27/2006  . LUMBAR SPINAL STENOSIS 04/27/2006  . ADENOMATOUS COLONIC POLYP 03/01/2003   Past Medical History: Past Medical History  Diagnosis Date  . Candidiasis of the esophagus 11/26/2007  . Myocardial infarct, old   . COPD, severe   . Sick sinus syndrome with tachycardia   . Pacemaker   . Chronic kidney disease (CKD), stage III (moderate)   . Hypertension   . Acute appendicitis with rupture   . Spinal stenosis, lumbar   . Adrenal adenoma     Incidentaloma  . Spondylolisthesis of lumbar region   . Lumbar herniated disc     History of HNP L4/5 in 2003  . Hx of colonoscopy with polypectomy 04/27/2010    Dr Cristina Gong found three  tubular adenomas each less than 10 mm size  . Retinal hemorrhage of left eye 06/2010  . Cataract 2013    Bilateral   . Abnormal mammogram, unspecified 08/23/2010    Followup imaging reassuring.  Repeat in 6 months.   Marland Kitchen DISC WITH RADICULOPATHY 04/27/2006    Qualifier: Diagnosis of  By: McDiarmid MD, Sherren Mocha    . Transitional cell carcinoma of ureter, history   . Mild cognitive impairment 10/26/2012    (10/25/12) Failed MiniCog screen  . 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 Normal uric acid serum level at time of attack.    Marland Kitchen COPD (chronic obstructive pulmonary disease)    Past Surgical History: Past Surgical History  Procedure Laterality Date  . Pacemaker insertion      Dr  Lovena Le ([facility])  . Nephrectomy  For transition cell cancer     Dr Tresa Endo, surgeon  . Replacement total knee  2009, Right knee    Dr Wynelle Link  . Replacement total knee  05/2009, Left knee    Dr Wynelle Link  . Knee arthroscopy w/ synovectomy  11/2009, left knee    Dr Wynelle Link  . Cholecystectomy    . Esophagogastroduodenoscopy  04/27/2010    Dr Cristina Gong - found transient H/H & Schatzki's ring  . Esophagogastroduodenoscopy endoscopy  03/06/2012    Dr Cristina Gong - found non-obstuctive Schatzki's ring at [employer] [facility]. o/w normal EGD.    Social History: History  Substance Use Topics  . Smoking status: Former Smoker    Types: Cigarettes  . Smokeless tobacco: Never Used  . Alcohol Use: 3.0 oz/week    6 drink(s) per week     Comment: 4-5 glasses of wine per week   Additional social history:   Please also refer to relevant sections of EMR.  Family History: No family history on file. Allergies and Medications: Allergies  Allergen Reactions  . Codeine Phosphate Itching    REACTION: unspecified  . Hydrochlorothiazide Other (See Comments)    Possible acute gouty arthritis of wrist  . Montelukast Sodium     REACTION: unspecified   No current facility-administered medications on file prior to encounter.   Current Outpatient Prescriptions on File Prior to Encounter  Medication Sig Dispense Refill  . albuterol (VENTOLIN HFA) 108 (90 BASE) MCG/ACT inhaler Inhale 2 puffs into the lungs every 6 (six) hours as needed. If needed for shortness of breath.  1 Inhaler  3  . ferrous sulfate 325 (65 FE) MG tablet Take 325 mg by mouth daily.      . Fluticasone-Salmeterol (ADVAIR DISKUS) 250-50 MCG/DOSE AEPB Inhale 1 puff into the lungs 2 (two) times daily as needed (shortness of breath).       . metoprolol succinate (TOPROL-XL) 50 MG 24 hr tablet Take 50 mg by mouth daily.       . nitroGLYCERIN (NITROSTAT) 0.4 MG SL tablet Place 1 tablet (0.4 mg total) under the tongue every 5 (five) minutes as needed.   30 tablet  1  . omeprazole (PRILOSEC) 40 MG capsule Take 40 mg by mouth daily.       Marland Kitchen tiotropium (SPIRIVA) 18 MCG inhalation capsule Place 1 capsule (18 mcg total) into inhaler and inhale daily.  30 capsule  PRN  . vitamin B-12 (CYANOCOBALAMIN) 1000 MCG tablet Take 1,000 mcg by mouth daily.         Objective: BP 115/58  Pulse 84  Temp(Src) 98.2 F (36.8 C)  Resp 24  Ht 5\' 4"  (1.626 m)  Wt 174 lb (78.926 kg)  BMI 29.85 kg/m2  SpO2 90% Exam: General: Pleasant elderly Female; NAD HEENT: MMM Cardiovascular: irregular rhythm (occasional extra beat), normal rate; no m/r/g Respiratory: mild rhonchi in b/l lower lung fields R>L; Oxy stat ~ 90% on RA Abdomen: Soft, NT/ND Extremities: WWP; No edema; 2+ pulses  Labs and Imaging: CBC BMET   Recent Labs Lab 03/13/13 1844  WBC 6.8  HGB 13.0  HCT 39.3  PLT 241    Recent Labs Lab 03/13/13 1844  NA 141  K 3.9  CL 102  CO2 24  BUN 27*  CREATININE 1.34*  GLUCOSE 106*  CALCIUM 9.4     BNP (last 3 results)  Recent Labs  03/13/13 1844  PROBNP 1642.0*     Recent Labs Lab 03/13/13 1844  TROPONINI <0.30   Imaging/Diagnostic Tests: CXR 1/14 There is underlying emphysema. There is a somewhat nodular appearing  opacity near the left heart border which probably represents focal  scarring. It appears stable. There are scattered areas of scarring  throughout the left lower lobe which are stable.  There is no edema or consolidation.  The heart is mildly enlarged with pulmonary vascularity which is  stable and reflects the underlying emphysema. Pacemaker leads are  attached to the right atrium and right ventricle. No adenopathy. No  pneumothorax. No bone lesions. .   IMPRESSION:  Underlying emphysema with areas of scarring in the left lower lobe.  No edema or consolidation. Stable cardiac silhouette.  Phill Myron, MD 03/13/2013, 10:04 PM PGY-1, [facility] pager: [phone], text pages  welcome

## 2013-03-13 NOTE — ED Notes (Signed)
MD at bedside. 

## 2013-03-13 NOTE — Progress Notes (Signed)
Subjective:     Patient ID: [name], female   DOB: 06-22-1923, 80 y.o.   MRN: [id number]  HPI Cough/COPD:  She started coughing 2 wks ago,gradually worsening,associated with copious yellowish sputum, SOB,wheezing especially with walking, and some chest tightness.OTC cough syrup did not help. She completed the whole course of Z-pak yesterday with no improvement of her symptom,she stated this was given to her by her PCP few months ago since she has had recurrent episodes of pneumonia. Her cough is still as bad as before. For her COPD she uses Albuterol prn,Spiriva and Advair,this all did not help with her symptoms.  Current Outpatient Prescriptions on File Prior to Visit  Medication Sig Dispense Refill  . albuterol (VENTOLIN HFA) 108 (90 BASE) MCG/ACT inhaler Inhale 2 puffs into the lungs every 6 (six) hours as needed. If needed for shortness of breath.  1 Inhaler  3  . tiotropium (SPIRIVA) 18 MCG inhalation capsule Place 1 capsule (18 mcg total) into inhaler and inhale daily.  30 capsule  PRN  . cyclobenzaprine (FLEXERIL) 5 MG tablet TAKE 1 TABLET BY MOUTH THREE TIMES DAILY AS NEEDED  30 tablet  1  . ferrous sulfate 325 (65 FE) MG tablet Take 325 mg by mouth daily.      . ferrous sulfate 325 (65 FE) MG tablet TAKE 1 TABLET BY MOUTH DAILY.  30 tablet  PRN  . Fluticasone-Salmeterol (ADVAIR DISKUS) 250-50 MCG/DOSE AEPB Inhale 1 puff into the lungs 2 (two) times daily as needed.       . furosemide (LASIX) 40 MG tablet TAKE 1 TABLET BY MOUTH IN THE MORNING  90 tablet  1  . ketorolac (ACULAR) 0.5 % ophthalmic solution       . LORazepam (ATIVAN) 0.5 MG tablet Take 0.5 mg by mouth every 8 (eight) hours as needed for anxiety.      . metoprolol succinate (TOPROL-XL) 50 MG 24 hr tablet Take 50 mg by mouth daily.       . nitroGLYCERIN (NITROSTAT) 0.4 MG SL tablet Place 1 tablet (0.4 mg total) under the tongue every 5 (five) minutes as needed.  30 tablet  1  . omeprazole (PRILOSEC) 40 MG capsule       .  simvastatin (ZOCOR) 40 MG tablet TAKE 1 TABLET BY MOUTH EVERY MORNING  30 tablet  2  . valACYclovir (VALTREX) 1000 MG tablet       . vitamin B-12 (CYANOCOBALAMIN) 1000 MCG tablet Take 1,000 mcg by mouth daily.       Marland Kitchen warfarin (COUMADIN) 1 MG tablet As directed  60 tablet  1   No current facility-administered medications on file prior to visit.   Past Medical History  Diagnosis Date  . Candidiasis of the esophagus 11/26/2007  . Myocardial infarct, old   . COPD, severe   . Sick sinus syndrome with tachycardia   . Pacemaker   . Chronic kidney disease (CKD), stage III (moderate)   . Hypertension   . Acute appendicitis with rupture   . Spinal stenosis, lumbar   . Adrenal adenoma     Incidentaloma  . Spondylolisthesis of lumbar region   . Lumbar herniated disc     History of HNP L4/5 in 2003  . Hx of colonoscopy with polypectomy 04/27/2010    Dr Cristina Gong found three  tubular adenomas each less than 10 mm size  . Retinal hemorrhage of left eye 06/2010  . Cataract 2013    Bilateral   . Abnormal mammogram, unspecified  08/23/2010    Followup imaging reassuring.  Repeat in 6 months.   Marland Kitchen DISC WITH RADICULOPATHY 04/27/2006    Qualifier: Diagnosis of  By: McDiarmid MD, Sherren Mocha    . Transitional cell carcinoma of ureter, history   . Mild cognitive impairment 10/26/2012    (10/25/12) Failed MiniCog screen  . 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 Normal uric acid serum level at time of attack.      Review of Systems  Respiratory: Positive for cough, chest tightness, shortness of breath and wheezing.   Cardiovascular: Negative for chest pain.  Gastrointestinal: Negative.   Genitourinary: Negative.   All other systems reviewed and are negative.   Filed Vitals:   03/13/13 1603 03/13/13 1642  BP: 151/86   Pulse: 105   Temp: 98 F (36.7 C)   TempSrc: Oral   Weight: 174 lb (78.926 kg)   SpO2: 93% 93%        Objective:   Physical Exam  Nursing note and vitals reviewed. Constitutional: She appears well-developed. No distress.  Cardiovascular: Normal rate, regular rhythm, normal heart sounds and intact distal pulses.   No murmur heard. Pulmonary/Chest: Effort normal. She has no decreased breath sounds. She has no wheezes. She has rhonchi in the right upper field, the right middle field, the right lower field, the left upper field, the left middle field and the left lower field.  Abdominal: Soft.       Assessment/Plan:      Cough: Bronchitis vs COPD exacerbation vs CAP Total time spent for this face to face encounter including coordination of care and phone call is more than 30 min.

## 2013-03-14 DIAGNOSIS — R0902 Hypoxemia: Secondary | ICD-10-CM | POA: Diagnosis not present

## 2013-03-14 DIAGNOSIS — Z7901 Long term (current) use of anticoagulants: Secondary | ICD-10-CM

## 2013-03-14 DIAGNOSIS — N183 Chronic kidney disease, stage 3 unspecified: Secondary | ICD-10-CM

## 2013-03-14 DIAGNOSIS — R059 Cough, unspecified: Secondary | ICD-10-CM

## 2013-03-14 DIAGNOSIS — I4891 Unspecified atrial fibrillation: Secondary | ICD-10-CM

## 2013-03-14 DIAGNOSIS — I10 Essential (primary) hypertension: Secondary | ICD-10-CM

## 2013-03-14 DIAGNOSIS — J441 Chronic obstructive pulmonary disease with (acute) exacerbation: Principal | ICD-10-CM

## 2013-03-14 DIAGNOSIS — R05 Cough: Secondary | ICD-10-CM

## 2013-03-14 LAB — CBC
HCT: 35.6 % — ABNORMAL LOW (ref 36.0–46.0)
Hemoglobin: 11.7 g/dL — ABNORMAL LOW (ref 12.0–15.0)
MCH: 31.6 pg (ref 26.0–34.0)
MCHC: 32.9 g/dL (ref 30.0–36.0)
MCV: 96.2 fL (ref 78.0–100.0)
PLATELETS: 202 10*3/uL (ref 150–400)
RBC: 3.7 MIL/uL — ABNORMAL LOW (ref 3.87–5.11)
RDW: 13.2 % (ref 11.5–15.5)
WBC: 5.9 10*3/uL (ref 4.0–10.5)

## 2013-03-14 LAB — BASIC METABOLIC PANEL
BUN: 29 mg/dL — AB (ref 6–23)
CALCIUM: 9.1 mg/dL (ref 8.4–10.5)
CO2: 23 meq/L (ref 19–32)
Chloride: 103 mEq/L (ref 96–112)
Creatinine, Ser: 1.18 mg/dL — ABNORMAL HIGH (ref 0.50–1.10)
GFR calc Af Amer: 47 mL/min — ABNORMAL LOW (ref >90)
GFR, EST NON AFRICAN AMERICAN: 40 mL/min — AB (ref >90)
GLUCOSE: 214 mg/dL — AB (ref 70–99)
Potassium: 4.2 mEq/L (ref 3.7–5.3)
SODIUM: 144 meq/L (ref 137–147)

## 2013-03-14 LAB — PROTIME-INR
INR: 1.66 — ABNORMAL HIGH (ref 0.00–1.49)
Prothrombin Time: 19.1 seconds — ABNORMAL HIGH (ref 11.6–15.2)

## 2013-03-14 MED ORDER — LEVOFLOXACIN 750 MG PO TABS: 750.0000 mg | ORAL_TABLET | ORAL | Status: DC

## 2013-03-14 MED ORDER — CYCLOBENZAPRINE HCL 10 MG PO TABS: 5.0000 mg | ORAL_TABLET | Freq: Three times a day (TID) | ORAL | Status: DC | PRN

## 2013-03-14 MED ORDER — FERROUS SULFATE 325 (65 FE) MG PO TABS
325.0000 mg | ORAL_TABLET | Freq: Every day | ORAL | Status: DC
  Administered 2013-03-14: 325 mg via ORAL
  Filled 2013-03-14 (×2): qty 1

## 2013-03-14 MED ORDER — MOMETASONE FURO-FORMOTEROL FUM 100-5 MCG/ACT IN AERO: 2.0000 | INHALATION_SPRAY | Freq: Two times a day (BID) | RESPIRATORY_TRACT | Status: DC

## 2013-03-14 MED ORDER — IPRATROPIUM-ALBUTEROL 0.5-2.5 (3) MG/3ML IN SOLN: 3.0000 mL | Freq: Four times a day (QID) | RESPIRATORY_TRACT | Status: DC

## 2013-03-14 MED ORDER — SIMVASTATIN 40 MG PO TABS
40.0000 mg | ORAL_TABLET | Freq: Every day | ORAL | Status: DC
  Administered 2013-03-14: 40 mg via ORAL
  Filled 2013-03-14: qty 1

## 2013-03-14 MED ORDER — METOPROLOL SUCCINATE ER 50 MG PO TB24
50.0000 mg | ORAL_TABLET | Freq: Every day | ORAL | Status: DC
  Administered 2013-03-14: 50 mg via ORAL
  Filled 2013-03-14: qty 1

## 2013-03-14 MED ORDER — ASPIRIN EC 81 MG PO TBEC
81.0000 mg | DELAYED_RELEASE_TABLET | Freq: Every day | ORAL | Status: DC
  Administered 2013-03-14: 81 mg via ORAL
  Filled 2013-03-14: qty 1

## 2013-03-14 MED ORDER — WARFARIN SODIUM 1 MG PO TABS: 1.0000 mg | ORAL_TABLET | Freq: Every day | ORAL | Status: DC

## 2013-03-14 MED ORDER — MOMETASONE FURO-FORMOTEROL FUM 100-5 MCG/ACT IN AERO
2.0000 | INHALATION_SPRAY | Freq: Two times a day (BID) | RESPIRATORY_TRACT | Status: DC
  Administered 2013-03-14: 2 via RESPIRATORY_TRACT
  Filled 2013-03-14: qty 8.8

## 2013-03-14 MED ORDER — PANTOPRAZOLE SODIUM 40 MG PO TBEC
40.0000 mg | DELAYED_RELEASE_TABLET | Freq: Every day | ORAL | Status: DC
  Administered 2013-03-14: 40 mg via ORAL
  Filled 2013-03-14: qty 1

## 2013-03-14 MED ORDER — PREDNISONE 50 MG PO TABS: ORAL_TABLET | ORAL | Status: DC

## 2013-03-14 NOTE — Discharge Summary (Signed)
[facility] Discharge Summary  Patient name: [name] Medical record number: [id number] Date of birth: 07-06-1923 Age: 80 y.o. Gender: female Date of Admission: 03/13/2013  Date of Discharge: 03/14/13 Admitting Physician: Dickie La, MD  Primary Care Provider: MCDIARMID,TODD D, MD Consultants: None  Indication for Hospitalization: COPD exacerbation  Discharge Diagnoses/Problem List:   COPD exacerbation  HFpEF  HTN  CKD stage 3  CAD s/p PCI   Tachy-Bracy syndrome s/p pacemaker  Afib on coumadin  Disposition: Home  Discharge Condition: Stable  Discharge Exam: General: Pleasant elderly Female; NAD  HEENT: MMM  Cardiovascular: irregular rhythm, normal rate; no m/r/g  Respiratory: dec breath sounds; mild exp wheeze; Oxy stat ~ 95% on RA  Abdomen: Soft, NT/ND  Extremities: WWP; No edema; 2+ pulses   Brief Hospital Course: [name] is a 80 y.o. female who presented with COPD exacerbation. PMH is significant for COPD, HFpEF, MI. She responded well to CAT and solumedrol in ED, and was admitted for observation due to initial oxygen requirement. She was transitioned to duonebs q 4hrs and prednisone. Oxygen was wean back to room air, and she was discharge home to complete Levaquin and prednisone course. She was switched from advair to [place] and given a spacer to use with albuterol.   Issues for Follow Up:   Check BMET; f/u CKD stage 3  Reinforce using dulera every day not as needed; and to use spacer w/ albuterol   Consider PFTs to guide/maximize COPD management  Significant Procedures: None  Significant Labs and Imaging:   Recent Labs Lab 03/13/13 1844 03/14/13 0359  WBC 6.8 5.9  HGB 13.0 11.7*  HCT 39.3 35.6*  PLT 241 202    Recent Labs Lab 03/13/13 1844 03/14/13 0359  NA 141 144  K 3.9 4.2  CL 102 103  CO2 24 23  GLUCOSE 106* 214*  BUN 27* 29*  CREATININE 1.34* 1.18*  CALCIUM 9.4 9.1   BNP    Component Value  Date/Time   PROBNP 1642.0* 03/13/2013 1844      Recent Labs Lab 03/13/13 1844  TROPONINI <0.30    Recent Labs Lab 03/13/13 1844 03/14/13 0359  INR 1.50* 1.66*    CXR 1/14  There is underlying emphysema. There is a somewhat nodular appearing  opacity near the left heart border which probably represents focal  scarring. It appears stable. There are scattered areas of scarring  throughout the left lower lobe which are stable.  There is no edema or consolidation.  The heart is mildly enlarged with pulmonary vascularity which is  stable and reflects the underlying emphysema. Pacemaker leads are  attached to the right atrium and right ventricle. No adenopathy. No  pneumothorax. No bone lesions. .  IMPRESSION:  Underlying emphysema with areas of scarring in the left lower lobe.  No edema or consolidation. Stable cardiac silhouette.  Results/Tests Pending at Time of Discharge: None  Discharge Medications:    Medication List    STOP taking these medications       ADVAIR DISKUS 250-50 MCG/DOSE Aepb  Generic drug:  Fluticasone-Salmeterol  Replaced by:  mometasone-formoterol 100-5 MCG/ACT Aero      TAKE these medications       albuterol (2.5 MG/3ML) 0.083% nebulizer solution  Commonly known as:  PROVENTIL  Take 2.5 mg by nebulization every 6 (six) hours as needed for wheezing or shortness of breath.     albuterol 108 (90 BASE) MCG/ACT inhaler  Commonly known as:  VENTOLIN HFA  Inhale 2 puffs into the lungs every 6 (six) hours as needed. If needed for shortness of breath.     cyclobenzaprine 5 MG tablet  Commonly known as:  FLEXERIL  Take 5 mg by mouth 3 (three) times daily as needed for muscle spasms.     ferrous sulfate 325 (65 FE) MG tablet  Take 325 mg by mouth daily.     furosemide 40 MG tablet  Commonly known as:  LASIX  Take 40 mg by mouth daily as needed for fluid.     levofloxacin 750 MG tablet  Commonly known as:  LEVAQUIN  Take 1 tablet (750 mg total)  by mouth every other day. Take one pill on Friday 1/16, NO PILL on Sat, and one pill on Sun 1/8; NO PILL Monday; one pill on Tuesday     metoprolol succinate 50 MG 24 hr tablet  Commonly known as:  TOPROL-XL  Take 50 mg by mouth daily.     mometasone-formoterol 100-5 MCG/ACT Aero  Commonly known as:  DULERA  Inhale 2 puffs into the lungs 2 (two) times daily.     nitroGLYCERIN 0.4 MG SL tablet  Commonly known as:  NITROSTAT  Place 1 tablet (0.4 mg total) under the tongue every 5 (five) minutes as needed.     omeprazole 40 MG capsule  Commonly known as:  PRILOSEC  Take 40 mg by mouth daily.     predniSONE 50 MG tablet  Commonly known as:  DELTASONE  Take one pill daily for 8 days     simvastatin 40 MG tablet  Commonly known as:  ZOCOR  Take 40 mg by mouth daily.     tiotropium 18 MCG inhalation capsule  Commonly known as:  SPIRIVA  Place 1 capsule (18 mcg total) into inhaler and inhale daily.     vitamin B-12 1000 MCG tablet  Commonly known as:  CYANOCOBALAMIN  Take 1,000 mcg by mouth daily.     warfarin 1 MG tablet  Commonly known as:  COUMADIN  Take 1 mg by mouth daily.        Discharge Instructions: Please refer to Patient Instructions section of EMR for full details.  Patient was counseled important signs and symptoms that should prompt return to medical care, changes in medications, dietary instructions, activity restrictions, and follow up appointments.   Follow-Up Appointments: Follow-up Information   Call MCDIARMID,TODD D, MD. (As needed)    Specialty:  Family Medicine   Contact information:   [place] [address] [phone]       Phill Myron, MD 03/14/2013, 8:54 AM PGY-1, [place]

## 2013-03-14 NOTE — H&P (Signed)
[facility] Attending Admission Note: Dorcas Mcmurray MD [phone] pager office [phone] I  have seen and examined this patient, reviewed their chart. I have discussed this patient with the resident. I agree with the resident's findings, assessment and care plan.Feeling much more back to baseline this morning, no obvious shortness of breath. Talkative and happy. Has felt a  Little ill for a few days so I suspect this is viral issue. Potentially may be able to go home this PM.

## 2013-03-14 NOTE — Discharge Instructions (Signed)
·   Take Prednisone 50 mg once a day until prescription ends  Take Levaquin: One pill on Friday 1/16 & Sunday 1/18 & Tues 1/20: [place] ON Saturday OR Monday   Continue to use inhalers as need   Chronic Obstructive Pulmonary Disease Chronic obstructive pulmonary disease (COPD) is a common lung problem. In COPD, the flow of air from the lungs is limited. The way your lungs work will probably never return to normal, but there are things you can do to improve you lungs and make yourself feel better. HOME CARE  Take all medicines as told by your doctor.  Only take over-the-counter or prescription medicines as told by your doctor.  Avoid medicines or cough syrups that dry up your airway (such as antihistamines) and do not allow you to get rid of thick spit. You do not need to avoid them if told differently by your doctor.  If you smoke, stop. Smoking makes the problem worse.  Avoid being around things that make your breathing worse (like smoke, chemicals, and fumes).  Use oxygen therapy and therapy to help improve your lungs (pulmonary rehabilitation) if told by your doctor. If you need home oxygen therapy, ask your doctor if you should buy a tool to measure your oxygen level (oximeter).  Avoid people who have a sickness you can catch (contagious).  Avoid going outside when it is very hot, cold, or humid.  Eat healthy foods. Eat smaller meals more often. Rest before meals.  Stay active, but remember to also rest.  Make sure to get all the shots (vaccines) your doctor recommends. Ask your doctor if you need a pneumonia shot.  Learn and use tips on how to relax.  Learn and use tips on how to control your breathing as told by your doctor. Try:  Breathing in (inhaling) through your nose for 1 second. Then, pucker your lips and breath out (exhale) through your lips for 2 seconds.  Putting one hand on your belly (abdomen). Breathe in slowly through your nose for 1 second. Your  hand on your belly should move out. Pucker your lips and breathe out slowly through your lips. Your hand on your belly should move in as you breathe out.  Learn and use controlled coughing to clear thick spit from your lungs. 1. Lean your head a little forward. 2. Breathe in deeply. 3. Try to hold your breath for 3 seconds. 4. Keep your mouth slightly open while coughing 2 times. 5. Spit any thick spit out into a tissue. 6. Rest and do the steps again 1 or 2 times as needed. GET HELP IF:  You cough up more thick spit than usual.  There is a change in the color or thickness of the spit.  It is harder to breathe than usual.  Your breathing is faster than usual. GET HELP RIGHT AWAY IF:   You have shortness of breath while resting.  You have shortness of breath that stops you from:  Being able to talk.  Doing normal activities.  You chest hurts for longer than 5 minutes.  Your skin color is more blue than usual.  Your pulse oximeter shows that you have low oxygen for longer than 5 minutes. MAKE SURE YOU:   Understand these instructions.  Will watch your condition.  Will get help right away if you are not doing well or get worse. Document Released: 08/03/2007 Document Revised: 12/05/2012 Document Reviewed: 10/11/2012 [facility] Patient Information 2014 [place], [place].

## 2013-03-20 ENCOUNTER — Other Ambulatory Visit: Payer: Self-pay | Admitting: Family Medicine

## 2013-03-20 NOTE — Discharge Summary (Signed)
Family Medicine Teaching Service  Discharge Note : Attending Tomeeka Plaugher MD Pager [phone] Office [phone] I have seen and examined this patient, reviewed their chart and discussed discharge planning wit the resident at the time of discharge. I agree with the discharge plan as above.  

## 2013-03-21 DIAGNOSIS — H353290 Exudative age-related macular degeneration, unspecified eye, stage unspecified: Secondary | ICD-10-CM | POA: Diagnosis not present

## 2013-03-25 DIAGNOSIS — D649 Anemia, unspecified: Secondary | ICD-10-CM | POA: Diagnosis not present

## 2013-03-25 DIAGNOSIS — Z86010 Personal history of colonic polyps: Secondary | ICD-10-CM | POA: Diagnosis not present

## 2013-04-02 ENCOUNTER — Encounter: Payer: Self-pay | Admitting: Internal Medicine

## 2013-04-02 ENCOUNTER — Ambulatory Visit (INDEPENDENT_AMBULATORY_CARE_PROVIDER_SITE_OTHER): Payer: Medicare Other | Admitting: Internal Medicine

## 2013-04-02 VITALS — BP 138/82 | HR 83 | Ht 64.5 in | Wt 180.0 lb

## 2013-04-02 DIAGNOSIS — I10 Essential (primary) hypertension: Secondary | ICD-10-CM | POA: Diagnosis not present

## 2013-04-02 DIAGNOSIS — I495 Sick sinus syndrome: Secondary | ICD-10-CM

## 2013-04-02 DIAGNOSIS — I4891 Unspecified atrial fibrillation: Secondary | ICD-10-CM | POA: Diagnosis not present

## 2013-04-02 DIAGNOSIS — H353290 Exudative age-related macular degeneration, unspecified eye, stage unspecified: Secondary | ICD-10-CM | POA: Diagnosis not present

## 2013-04-02 DIAGNOSIS — Z950 Presence of cardiac pacemaker: Secondary | ICD-10-CM | POA: Diagnosis not present

## 2013-04-02 DIAGNOSIS — I480 Paroxysmal atrial fibrillation: Secondary | ICD-10-CM

## 2013-04-02 LAB — MDC_IDC_ENUM_SESS_TYPE_INCLINIC
Battery Impedance: 100 Ohm
Battery Remaining Longevity: 147 mo
Battery Voltage: 2.8 V
Brady Statistic AP VS Percent: 83 %
Date Time Interrogation Session: 20150203162218
Lead Channel Impedance Value: 597 Ohm
Lead Channel Impedance Value: 891 Ohm
Lead Channel Pacing Threshold Amplitude: 0.5 V
Lead Channel Pacing Threshold Amplitude: 0.5 V
Lead Channel Pacing Threshold Pulse Width: 0.4 ms
Lead Channel Sensing Intrinsic Amplitude: 22.4 mV
Lead Channel Sensing Intrinsic Amplitude: 4 mV
Lead Channel Setting Pacing Amplitude: 1.5 V
Lead Channel Setting Sensing Sensitivity: 5.6 mV
MDC IDC MSMT LEADCHNL RV PACING THRESHOLD PULSEWIDTH: 0.4 ms
MDC IDC SET LEADCHNL RV PACING AMPLITUDE: 2 V
MDC IDC SET LEADCHNL RV PACING PULSEWIDTH: 0.4 ms
MDC IDC STAT BRADY AP VP PERCENT: 0 %
MDC IDC STAT BRADY AS VP PERCENT: 0 %
MDC IDC STAT BRADY AS VS PERCENT: 17 %

## 2013-04-02 NOTE — Assessment & Plan Note (Signed)
Her [employer] DDD PM is working normally. Will recheck in several months. 

## 2013-04-02 NOTE — Progress Notes (Signed)
HPI Mrs. Whoolery returns today for followup. She is a pleasant 80 yo woman with a h/o symptomatic tachy-brady syndrome, PAF, s/p PPM insertion. She has undergone PPM generator change and has done well. She denies chest pain or sob. No syncope. Minimal peripheral edema. Allergies  Allergen Reactions  . Codeine Phosphate Itching    REACTION: unspecified  . Hydrochlorothiazide Other (See Comments)    Possible acute gouty arthritis of wrist  . Montelukast Sodium     REACTION: unspecified     Current Outpatient Prescriptions  Medication Sig Dispense Refill  . ADVAIR DISKUS 250-50 MCG/DOSE AEPB 2 puffs every 6 (six) hours as needed.      Marland Kitchen albuterol (PROVENTIL) (2.5 MG/3ML) 0.083% nebulizer solution Take 2.5 mg by nebulization every 6 (six) hours as needed for wheezing or shortness of breath.      Marland Kitchen albuterol (VENTOLIN HFA) 108 (90 BASE) MCG/ACT inhaler Inhale 2 puffs into the lungs every 6 (six) hours as needed. If needed for shortness of breath.  1 Inhaler  3  . azithromycin (ZITHROMAX) 250 MG tablet Take 1 tablet by mouth daily.      . cholecalciferol (VITAMIN D) 1000 UNITS tablet Take 1,000 Units by mouth daily.      . cyclobenzaprine (FLEXERIL) 5 MG tablet Take 5 mg by mouth 3 (three) times daily as needed for muscle spasms.      . ferrous sulfate 325 (65 FE) MG tablet Take 325 mg by mouth daily.      . furosemide (LASIX) 40 MG tablet Take 40 mg by mouth daily as needed for fluid.      . metoprolol succinate (TOPROL-XL) 50 MG 24 hr tablet Take 50 mg by mouth daily.       . mometasone-formoterol (DULERA) 100-5 MCG/ACT AERO Inhale 2 puffs into the lungs 2 (two) times daily.  1 Inhaler  1  . nitroGLYCERIN (NITROSTAT) 0.4 MG SL tablet Place 1 tablet (0.4 mg total) under the tongue every 5 (five) minutes as needed.  30 tablet  1  . omeprazole (PRILOSEC) 40 MG capsule TAKE ONE CAPSULE BY MOUTH AS NEEDED      . predniSONE (DELTASONE) 50 MG tablet Take one pill daily for 8 days  8 tablet  0   . simvastatin (ZOCOR) 40 MG tablet Take 40 mg by mouth daily.      Marland Kitchen tiotropium (SPIRIVA) 18 MCG inhalation capsule Place 1 capsule (18 mcg total) into inhaler and inhale daily.  30 capsule  PRN  . vitamin B-12 (CYANOCOBALAMIN) 1000 MCG tablet Take 1,000 mcg by mouth daily.       Marland Kitchen warfarin (COUMADIN) 1 MG tablet Take 1 mg by mouth daily.       No current facility-administered medications for this visit.     Past Medical History  Diagnosis Date  . Candidiasis of the esophagus 11/26/2007  . Myocardial infarct, old   . COPD, severe   . Sick sinus syndrome with tachycardia   . Pacemaker   . Chronic kidney disease (CKD), stage III (moderate)   . Hypertension   . Acute appendicitis with rupture   . Spinal stenosis, lumbar   . Adrenal adenoma     Incidentaloma  . Spondylolisthesis of lumbar region   . Lumbar herniated disc     History of HNP L4/5 in 2003  . Hx of colonoscopy with polypectomy 04/27/2010    Dr Cristina Gong found three  tubular adenomas each less than 10 mm size  .  Retinal hemorrhage of left eye 06/2010  . Cataract 2013    Bilateral   . Abnormal mammogram, unspecified 08/23/2010    Followup imaging reassuring.  Repeat in 6 months.   Marland Kitchen DISC WITH RADICULOPATHY 04/27/2006    Qualifier: Diagnosis of  By: McDiarmid MD, Sherren Mocha    . Transitional cell carcinoma of ureter, history   . Mild cognitive impairment 10/26/2012    (10/25/12) Failed MiniCog screen  . 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 Normal uric acid serum level at time of attack.    Marland Kitchen COPD (chronic obstructive pulmonary disease)     ROS:   All systems reviewed and negative except as noted in the HPI.   Past Surgical History  Procedure Laterality Date  . Pacemaker insertion      Dr Lovena Le ([facility])  . Nephrectomy  For transition cell cancer     Dr Tresa Endo, surgeon  . Replacement total knee  2009, Right knee    Dr Wynelle Link   . Replacement total knee  05/2009, Left knee    Dr Wynelle Link  . Knee arthroscopy w/ synovectomy  11/2009, left knee    Dr Wynelle Link  . Cholecystectomy    . Esophagogastroduodenoscopy  04/27/2010    Dr Cristina Gong - found transient H/H & Schatzki's ring  . Esophagogastroduodenoscopy endoscopy  03/06/2012    Dr Cristina Gong - found non-obstuctive Schatzki's ring at [facility]. o/w normal EGD.      No family history on file.   History   Social History  . Marital Status: Widowed    Spouse Name: N/A    Number of Children: 4  . Years of Education: N/A   Occupational History  . Futures trader     Retired   Social History Main Topics  . Smoking status: Former Smoker    Types: Cigarettes  . Smokeless tobacco: Never Used  . Alcohol Use: 3.0 oz/week    6 drink(s) per week     Comment: 4-5 glasses of wine per week  . Drug Use: No  . Sexual Activity: No   Other Topics Concern  . 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    Semi-retired Futures trader.     Pt has home nebulizer for inhalation therapies.   Former Smoker   Smoking Status:  quit < 6 months      (+) DNR status per discussion with Dr McDiarmid 10/25/12.   (+) Living Will/Advance Directive   (+) HC-POA: Cecile Sheerer Causey (pt's dgt)     BP 138/82  Pulse 83  Ht 5' 4.5" (1.638 m)  Wt 180 lb (81.647 kg)  BMI 30.43 kg/m2  Physical Exam:  Well appearing elderly woman, NAD HEENT: Unremarkable Neck:  6 cm JVD, no thyromegally Back:  No CVA tenderness Lungs:  Clear with no wheezes HEART:  Regular rate rhythm, no murmurs, no rubs, no clicks Abd:  soft, positive bowel sounds, no organomegally, no rebound, no guarding Ext:  2 plus pulses, no edema, no cyanosis, no clubbing Skin:  No rashes no nodules Neuro:  CN II through XII intact, motor  grossly intact   DEVICE  Normal device function.  See PaceArt for details.  Assess/Plan:

## 2013-04-02 NOTE — Assessment & Plan Note (Signed)
She is maintaining NSR over 99% of the time. Will follow.

## 2013-04-02 NOTE — Assessment & Plan Note (Signed)
Her blood pressure is well controlled. No change in medical therapy. She will maintain a low sodium diet.

## 2013-04-02 NOTE — Patient Instructions (Signed)
Your physician wants you to follow-up in: 12 months with Dr Knox Saliva will receive a reminder letter in the mail two months in advance. If you don't receive a letter, please call our office to schedule the follow-up appointment.    Remote monitoring is used to monitor your Pacemaker or ICD from home. This monitoring reduces the number of office visits required to check your device to one time per year. It allows [place] to keep an eye on the functioning of your device to ensure it is working properly. You are scheduled for a device check from home on 07/03/13. You may send your transmission at any time that day. If you have a wireless device, the transmission will be sent automatically. After your physician reviews your transmission, you will receive a postcard with your next transmission date.

## 2013-04-23 ENCOUNTER — Other Ambulatory Visit: Payer: Self-pay | Admitting: Family Medicine

## 2013-05-09 DIAGNOSIS — H353290 Exudative age-related macular degeneration, unspecified eye, stage unspecified: Secondary | ICD-10-CM | POA: Diagnosis not present

## 2013-05-15 ENCOUNTER — Other Ambulatory Visit: Payer: Self-pay | Admitting: Family Medicine

## 2013-05-16 DIAGNOSIS — H353290 Exudative age-related macular degeneration, unspecified eye, stage unspecified: Secondary | ICD-10-CM | POA: Diagnosis not present

## 2013-05-20 ENCOUNTER — Encounter: Payer: Self-pay | Admitting: Family Medicine

## 2013-05-20 ENCOUNTER — Other Ambulatory Visit: Payer: Self-pay | Admitting: Family Medicine

## 2013-05-20 ENCOUNTER — Ambulatory Visit (INDEPENDENT_AMBULATORY_CARE_PROVIDER_SITE_OTHER): Payer: Medicare Other | Admitting: Family Medicine

## 2013-05-20 ENCOUNTER — Ambulatory Visit (INDEPENDENT_AMBULATORY_CARE_PROVIDER_SITE_OTHER): Payer: Medicare Other | Admitting: *Deleted

## 2013-05-20 VITALS — BP 107/63 | HR 106 | Temp 97.5°F | Wt 171.0 lb

## 2013-05-20 DIAGNOSIS — Z79899 Other long term (current) drug therapy: Secondary | ICD-10-CM | POA: Diagnosis not present

## 2013-05-20 DIAGNOSIS — N183 Chronic kidney disease, stage 3 unspecified: Secondary | ICD-10-CM | POA: Diagnosis not present

## 2013-05-20 DIAGNOSIS — Z7901 Long term (current) use of anticoagulants: Secondary | ICD-10-CM

## 2013-05-20 DIAGNOSIS — E785 Hyperlipidemia, unspecified: Secondary | ICD-10-CM

## 2013-05-20 DIAGNOSIS — I10 Essential (primary) hypertension: Secondary | ICD-10-CM | POA: Diagnosis not present

## 2013-05-20 DIAGNOSIS — D649 Anemia, unspecified: Secondary | ICD-10-CM | POA: Diagnosis not present

## 2013-05-20 DIAGNOSIS — D509 Iron deficiency anemia, unspecified: Secondary | ICD-10-CM | POA: Diagnosis not present

## 2013-05-20 LAB — CBC
HCT: 39.3 % (ref 36.0–46.0)
Hemoglobin: 13.2 g/dL (ref 12.0–15.0)
MCH: 30.7 pg (ref 26.0–34.0)
MCHC: 33.6 g/dL (ref 30.0–36.0)
MCV: 91.4 fL (ref 78.0–100.0)
PLATELETS: 249 10*3/uL (ref 150–400)
RBC: 4.3 MIL/uL (ref 3.87–5.11)
RDW: 13.1 % (ref 11.5–15.5)
WBC: 5.8 10*3/uL (ref 4.0–10.5)

## 2013-05-20 LAB — POCT INR: INR: 2

## 2013-05-20 MED ORDER — PREDNISONE 20 MG PO TABS: 40.0000 mg | ORAL_TABLET | Freq: Every day | ORAL | Status: DC

## 2013-05-20 NOTE — Patient Instructions (Signed)
Remember to take your Azithromycin and Prednisone with you on your trips.

## 2013-05-21 ENCOUNTER — Telehealth: Payer: Self-pay | Admitting: Family Medicine

## 2013-05-21 ENCOUNTER — Encounter: Payer: Self-pay | Admitting: Family Medicine

## 2013-05-21 ENCOUNTER — Telehealth: Payer: Self-pay | Admitting: *Deleted

## 2013-05-21 LAB — LIPID PANEL
CHOLESTEROL: 179 mg/dL (ref 0–200)
HDL: 38 mg/dL — AB (ref >39)
LDL Cholesterol: 93 mg/dL (ref 0–99)
Total CHOL/HDL Ratio: 4.7 Ratio
Triglycerides: 242 mg/dL — ABNORMAL HIGH (ref <150)
VLDL: 48 mg/dL — ABNORMAL HIGH (ref 0–40)

## 2013-05-21 LAB — BASIC METABOLIC PANEL
BUN: 29 mg/dL — ABNORMAL HIGH (ref 6–23)
CALCIUM: 9.3 mg/dL (ref 8.4–10.5)
CO2: 27 mEq/L (ref 19–32)
CREATININE: 1.23 mg/dL — AB (ref 0.50–1.10)
Chloride: 102 mEq/L (ref 96–112)
Glucose, Bld: 169 mg/dL — ABNORMAL HIGH (ref 70–99)
Potassium: 3.7 mEq/L (ref 3.5–5.3)
SODIUM: 141 meq/L (ref 135–145)

## 2013-05-21 LAB — FERRITIN: Ferritin: 181 ng/mL (ref 10–291)

## 2013-05-21 NOTE — Telephone Encounter (Signed)
Received fax from [facility] with a message stating pt has been on omeprazole 40 mg daily long-term and is interested in discontinuing medication. Can medication be tapered? Will forward to provider.  Fax placed in provider box.  Derl Barrow, RN

## 2013-05-21 NOTE — Telephone Encounter (Signed)
Pt received a call from [facility] but no message was left. She wondered if Dr Virgina Norfolk had called about her blood work Please advise

## 2013-05-21 NOTE — Assessment & Plan Note (Signed)
Basic Metabolic Panel:    Component Value Date/Time   NA 141 05/20/2013 1604   K 3.7 05/20/2013 1604   CL 102 05/20/2013 1604   CO2 27 05/20/2013 1604   BUN 29* 05/20/2013 1604   CREATININE 1.23* 05/20/2013 1604   CREATININE 1.18* 03/14/2013 0359   GLUCOSE 169* 05/20/2013 1604   CALCIUM 9.3 05/20/2013 1604   Stable creatinine.  Monitor.

## 2013-05-21 NOTE — Assessment & Plan Note (Signed)
Adequate gLDL controlcontrol. Tolerating medications.  No new end-organ damage.  Continue current medications.

## 2013-05-21 NOTE — Assessment & Plan Note (Signed)
CBC:    Component Value Date/Time   WBC 5.8 05/20/2013 1604   HGB 13.2 05/20/2013 1604   HCT 39.3 05/20/2013 1604   PLT 249 05/20/2013 1604   MCV 91.4 05/20/2013 1604   NEUTROABS 4.6 03/13/2013 1844   LYMPHSABS 1.1 03/13/2013 1844   MONOABS 0.7 03/13/2013 1844   EOSABS 0.4 03/13/2013 1844   BASOSABS 0.0 03/13/2013 1844   Iron/TIBC/Ferritin  FERRITIN 181 05/20/2013 1604   Stable hemoglobin.  Adequate Iron stores. Continue once daily ferrous sulfate to cover possible occult GI blood loss

## 2013-05-21 NOTE — Assessment & Plan Note (Signed)
Adequate glycemic control. Tolerating medications.  No new end-organ damage.  Continue current medications.  

## 2013-05-21 NOTE — Progress Notes (Signed)
Patient ID: [name], female   DOB: January 24, 1924, 80 y.o.   MRN: [id number]   Subjective:    Patient ID: [name], female    DOB: [birth year], 80 y.o.   MRN: [id number]  HPI Problem List Items Addressed This Visit     Cardiovascular and Mediastinum   HYPERTENSION, BENIGN ESSENTIAL (Chronic)  Disease Monitoring  Blood pressure range: not checking at home  Chest pain: no   Dyspnea: yes, but at baseline   Claudication: no, but had cramping pain last night in right leg  Medication compliance: yes, and has been taking Lasix 40 mg one time a week Medication Side Effects  Lightheadedness: no   Urinary frequency: no   Edema: no    Preventitive Healthcare:  Exercise: no   Diet Pattern: Watching her weight       AF (paroxysmal atrial fibrillation) (Chronic) - Onset several years ago.  Started Warfarin in Spring of this year.  - Pt taking her Warfarin and metoprolol. Taking Warfarin 1 mg tablet, 2 tablet daily - INR monitored at [facility] - No bleeding, no melena or hematochezia. - No palpitations. - No increase in her usual DOE     Genitourinary   CHRONIC KIDNEY DISEASE STAGE III (MODERATE) (Chronic) - Longstanding issue.  Solitary kidney (S/P surgery years ago) - Avoids NSAIDS - No hematuria. - Has been taking Lasix 40 mg one time a week for leg edema - Not taking Calcium or Vitamin D.  - History of Anemia, iron deficient on oral iron daily.      Other   Mild cognitive impairment - Failed MiniCog last visit in August - No decline in IADLs. Still driving and handling her own finances without problems.    Iron deficiency anemia - Present for over a year - continues to take iron supplements daily with only mild nausea and constipation - No syncope/near syncope.  No palpitations, No increase in DOE above baseline         Review of Systems See HPI     Objective:   Physical Exam VS reviewed GEN: Alert, Cooperative, Groomed, NAD HEENT: PERRL; EAC bilaterally not  occluded, COR: RRR, No M/G/R, No JVD, Normal PMI size and location LUNGS: BCTA, No Acc mm use, speaking in full sentences ABDOMEN: (+)BS, soft, NT, ND, No HSM, No palpable masses EXT: Trace peripheral pretibial leg edema bilaterally. Palpable bilateral pedal pulses.  Neuro: Sensation: Intact grossly to touc hbilateral feet; DTR:, Bilateral Knees 2+, Bilateral Ankles 1+ Gait: Normal speed, No significant path deviation, Step through +,  Psych: Normal affect/thought/speech/language    Assessment & Plan:

## 2013-05-29 ENCOUNTER — Other Ambulatory Visit: Payer: Self-pay | Admitting: Family Medicine

## 2013-06-20 ENCOUNTER — Ambulatory Visit (INDEPENDENT_AMBULATORY_CARE_PROVIDER_SITE_OTHER): Payer: Medicare Other | Admitting: Family Medicine

## 2013-06-20 ENCOUNTER — Ambulatory Visit (INDEPENDENT_AMBULATORY_CARE_PROVIDER_SITE_OTHER): Payer: Medicare Other | Admitting: *Deleted

## 2013-06-20 ENCOUNTER — Encounter: Payer: Self-pay | Admitting: Family Medicine

## 2013-06-20 VITALS — BP 108/76 | HR 116 | Temp 97.6°F | Wt 175.0 lb

## 2013-06-20 DIAGNOSIS — M48061 Spinal stenosis, lumbar region without neurogenic claudication: Secondary | ICD-10-CM

## 2013-06-20 DIAGNOSIS — N183 Chronic kidney disease, stage 3 unspecified: Secondary | ICD-10-CM | POA: Diagnosis not present

## 2013-06-20 DIAGNOSIS — G3184 Mild cognitive impairment, so stated: Secondary | ICD-10-CM

## 2013-06-20 DIAGNOSIS — I4891 Unspecified atrial fibrillation: Secondary | ICD-10-CM | POA: Diagnosis not present

## 2013-06-20 DIAGNOSIS — I480 Paroxysmal atrial fibrillation: Secondary | ICD-10-CM

## 2013-06-20 DIAGNOSIS — Z7901 Long term (current) use of anticoagulants: Secondary | ICD-10-CM | POA: Diagnosis not present

## 2013-06-20 DIAGNOSIS — D509 Iron deficiency anemia, unspecified: Secondary | ICD-10-CM

## 2013-06-20 DIAGNOSIS — D126 Benign neoplasm of colon, unspecified: Secondary | ICD-10-CM | POA: Diagnosis not present

## 2013-06-20 DIAGNOSIS — I10 Essential (primary) hypertension: Secondary | ICD-10-CM | POA: Diagnosis not present

## 2013-06-20 DIAGNOSIS — H353290 Exudative age-related macular degeneration, unspecified eye, stage unspecified: Secondary | ICD-10-CM | POA: Diagnosis not present

## 2013-06-20 LAB — BASIC METABOLIC PANEL
BUN: 23 mg/dL (ref 6–23)
CHLORIDE: 105 meq/L (ref 96–112)
CO2: 29 mEq/L (ref 19–32)
Calcium: 9.3 mg/dL (ref 8.4–10.5)
Creat: 1.04 mg/dL (ref 0.50–1.10)
Glucose, Bld: 114 mg/dL — ABNORMAL HIGH (ref 70–99)
POTASSIUM: 4 meq/L (ref 3.5–5.3)
Sodium: 144 mEq/L (ref 135–145)

## 2013-06-20 LAB — CBC
HCT: 37.7 % (ref 36.0–46.0)
HEMOGLOBIN: 12.8 g/dL (ref 12.0–15.0)
MCH: 30.8 pg (ref 26.0–34.0)
MCHC: 34 g/dL (ref 30.0–36.0)
MCV: 90.6 fL (ref 78.0–100.0)
Platelets: 256 10*3/uL (ref 150–400)
RBC: 4.16 MIL/uL (ref 3.87–5.11)
RDW: 13.6 % (ref 11.5–15.5)
WBC: 6.5 10*3/uL (ref 4.0–10.5)

## 2013-06-20 LAB — POCT INR: INR: 2

## 2013-06-20 NOTE — Patient Instructions (Addendum)
Start taking only a half tablet of Metoprolol a day  Please send copies of your Healthcare Power of Attorney and Advanced Directive documents to Dr Laretha Luepke so he can scan them into your Electronic Medical Record.   We are checking your kidneys and hemoglobin today.   You Warfarin (INR) level was 2.0 today. Keep taking your Warfarin 2 mg daily.   Recheck your Warfarin (INR) level in 4 weeks at the [facility] lab.  Call us to let our lab know you are coming in for your blood test.   You scored well on your Memory tests today.   We will recheck it periodically to see if there are any changes.

## 2013-06-21 ENCOUNTER — Encounter: Payer: Self-pay | Admitting: Family Medicine

## 2013-06-21 NOTE — Assessment & Plan Note (Signed)
Very mild cognitive impairment, not enough to be of clinical significance at this time. Montreal Cognitive Assessment (25 out of 30, points off in visuospatial and and short-term memory).  Independent in iADLs and ADLs. Will recheck MoCa periodically or if concerns arise.

## 2013-06-21 NOTE — Assessment & Plan Note (Signed)
Excessive reduction in BP.  No syncope/near-syncope or dizziness.  Currently in regular rhythm.  Recommended that [name] reduce her metoprolol succinate 50 mg tab from one whole tab to half tab (25 mg) daily.  Recheck sitting and standing BP next office visit.

## 2013-06-21 NOTE — Assessment & Plan Note (Signed)
Lab Results  Component Value Date   WBC 6.5 06/20/2013   HGB 12.8 06/20/2013   HCT 37.7 06/20/2013   MCV 90.6 06/20/2013   PLT 256 06/20/2013  Stable  Adequate iron replacement. No significant adverse effects from iron tab. Continue current Ferrous Sulfate one tab daily Monitor periodically or if symptoms develop

## 2013-06-21 NOTE — Assessment & Plan Note (Signed)
Currently in regular rhythm Adequate anticoagulation without evidence of bleeding. Continue current regiment except decreasing metoprolol succinate to 25 mg daily due to lower SBP.

## 2013-06-21 NOTE — Progress Notes (Signed)
Patient ID: [name], female   DOB: 1924-02-03, 80 y.o.   MRN: [id number]   Subjective:    Patient ID: [name], female    DOB: 1923/08/31, 80 y.o.   MRN: [id number]  Hypertension  Anemia  Atrial Fibrillation Symptoms include hypertension. Past medical history includes atrial fibrillation.   Problem List Items Addressed This Visit     Cardiovascular and Mediastinum   HYPERTENSION, BENIGN ESSENTIAL (Chronic)  Disease Monitoring  Blood pressure range: not checking at home  Chest pain: no   Dyspnea: yes, but at baseline   Claudication:none  Medication compliance: yes, taking metoprolol succinate 50 mg daily  taking lasix less than once a week Medication Side Effects  Lightheadedness: no   Urinary frequency: no   Edema: no    Preventitive Healthcare:  Exercise: no   Diet Pattern: Watching her weight       AF (paroxysmal atrial fibrillation) (Chronic) - Onset several years ago.  Started Warfarin in Spring 2014.  - Pt taking her Warfarin and metoprolol. Taking Warfarin 1 mg tablet, 2 tablet daily - INR monitored at [facility] - No bleeding, no melena or hematochezia. - No palpitations. - No increase in her usual DOE     Genitourinary   CHRONIC KIDNEY DISEASE STAGE III (MODERATE) (Chronic) - Longstanding issue.  Solitary kidney (S/P surgery years ago) - Avoids NSAIDS - No hematuria. - Has been taking Lasix 40 mg less than one time a week for leg edema - taking Calcium and Vitamin D.  - History of Anemia, iron deficient on oral iron daily, one tab daily.      Other   Mild cognitive impairment Geriatric Function Screen   Fall in past Year? no ADL/iADL:  - Get out of bed by themself? yes - Dress Themselves? yes - Make own meal? yes  - Do own shopping? yes - Handle Own Finances? yes - Driving? yes  Social Support:  - Contact in case of illness or emergency: Dgt Truman Hayward) lives with her,  Dgt, [name], is HCPOA and durable POA per patient.   Nutrition:  - Loss of >=  10 lbs over past 6 months: no - BMI < 22%? no  Urinary Incontinence:  - Ever lose urine and get wet? no  Depression:  - Feel sad or depressed? no   MoCa Score = 25 out of 30 with greater than 12th grade education      Iron deficiency anemia - Present for several years - No melena or hematochezia. - continues to take iron supplements daily with only mild nausea and constipation - No syncope/near syncope.  No palpitations, No increase in DOE above baseline         Review of Systems See HPI     Objective:   Physical Exam VS reviewed GEN: Alert, Cooperative, Groomed, NAD HEENT: PERRL; EAC bilaterally not occluded, COR: RRR, No M/G/R, No JVD, Normal PMI size and location LUNGS: BCTA, No Acc mm use, speaking in full sentences ABDOMEN: (+)BS, soft, NT, ND, No HSM, No palpable masses EXT: Trace peripheral pretibial leg edema bilaterally. Palpable bilateral pedal pulses.  Neuro: Sensation: Intact grossly to touc hbilateral feet; DTR:, Bilateral Knees 2+, Bilateral Ankles 1+ Gait: Normal speed, No significant path deviation, Step through +,  Psych: Normal affect/thought/speech/language    Assessment & Plan:

## 2013-06-27 DIAGNOSIS — H353290 Exudative age-related macular degeneration, unspecified eye, stage unspecified: Secondary | ICD-10-CM | POA: Diagnosis not present

## 2013-07-03 ENCOUNTER — Encounter: Payer: Medicare Other | Admitting: *Deleted

## 2013-07-12 ENCOUNTER — Encounter: Payer: Self-pay | Admitting: Cardiology

## 2013-07-18 ENCOUNTER — Ambulatory Visit (INDEPENDENT_AMBULATORY_CARE_PROVIDER_SITE_OTHER): Payer: Medicare Other | Admitting: *Deleted

## 2013-07-18 DIAGNOSIS — Z7901 Long term (current) use of anticoagulants: Secondary | ICD-10-CM | POA: Diagnosis not present

## 2013-07-18 DIAGNOSIS — H353290 Exudative age-related macular degeneration, unspecified eye, stage unspecified: Secondary | ICD-10-CM | POA: Diagnosis not present

## 2013-07-18 LAB — POCT INR: INR: 1.7

## 2013-07-26 ENCOUNTER — Other Ambulatory Visit: Payer: Self-pay | Admitting: Family Medicine

## 2013-07-26 ENCOUNTER — Telehealth: Payer: Self-pay | Admitting: *Deleted

## 2013-07-26 NOTE — Telephone Encounter (Signed)
Refill request from Linna Hoff, Pharmacist at [facility] for omeprazole 40 mg and simvastatin 40 mg given for 3 month supply by verbal order Dr. Erin Hearing.  Derl Barrow, RN

## 2013-07-30 ENCOUNTER — Ambulatory Visit: Payer: Medicare Other

## 2013-08-03 ENCOUNTER — Other Ambulatory Visit: Payer: Self-pay | Admitting: Family Medicine

## 2013-08-09 ENCOUNTER — Ambulatory Visit (INDEPENDENT_AMBULATORY_CARE_PROVIDER_SITE_OTHER): Payer: Medicare Other | Admitting: *Deleted

## 2013-08-09 ENCOUNTER — Ambulatory Visit (INDEPENDENT_AMBULATORY_CARE_PROVIDER_SITE_OTHER): Payer: Medicare Other | Admitting: Family Medicine

## 2013-08-09 ENCOUNTER — Encounter: Payer: Self-pay | Admitting: Family Medicine

## 2013-08-09 VITALS — BP 114/70 | HR 89 | Temp 98.2°F | Ht 64.5 in | Wt 170.0 lb

## 2013-08-09 DIAGNOSIS — Z7901 Long term (current) use of anticoagulants: Secondary | ICD-10-CM

## 2013-08-09 DIAGNOSIS — I4891 Unspecified atrial fibrillation: Secondary | ICD-10-CM

## 2013-08-09 DIAGNOSIS — J449 Chronic obstructive pulmonary disease, unspecified: Secondary | ICD-10-CM

## 2013-08-09 DIAGNOSIS — I480 Paroxysmal atrial fibrillation: Secondary | ICD-10-CM

## 2013-08-09 LAB — POCT INR: INR: 2.6

## 2013-08-09 MED ORDER — PREDNISONE 20 MG PO TABS: 40.0000 mg | ORAL_TABLET | Freq: Every day | ORAL | Status: DC

## 2013-08-09 MED ORDER — ALBUTEROL SULFATE (2.5 MG/3ML) 0.083% IN NEBU: 2.5000 mg | INHALATION_SOLUTION | Freq: Four times a day (QID) | RESPIRATORY_TRACT | Status: DC | PRN

## 2013-08-09 MED ORDER — ALBUTEROL SULFATE (2.5 MG/3ML) 0.083% IN NEBU
2.5000 mg | INHALATION_SOLUTION | Freq: Once | RESPIRATORY_TRACT | Status: AC
  Administered 2013-08-09: 2.5 mg via RESPIRATORY_TRACT

## 2013-08-09 NOTE — Assessment & Plan Note (Signed)
INR 2.6 today; continue on warfarin at current dosing and schedule.

## 2013-08-09 NOTE — Progress Notes (Signed)
   Subjective:    Patient ID: [name], female    DOB: 08/30/1923, 80 y.o.   MRN: [id number]  HPI Patient here for SDA for worsening dyspnea over the past few days. Was at the beach from end[place]May until [place] 9th, began with some dyspnea while at the beach (niece's house in [place]).  Had trouble with the steep stairs at the beach house, got winded easily. Did not have the tubing for her home nebulizer to administer her albuterol, has been trying to use her HFA which she does not find as helpful.  No fevers, has had cough with some clear sputum without blood.  Patient reports that she is having new partial dentures made; her niece's Shelty dog took the plate on which she put her dentures and no one has been able to find them (the dentures).   Review of Systems     Objective:   Physical Exam Alert and awake, able to give thorough history in fluid sentences.  No apparent distress HEENT Neck supple, no cervical adenopathy. Clear oropharynx. Moist mucus membranes. No JVD.  COR Regular S1S2, no extra sounds PULM Generalized coarseness of lung sounds throughout, no focal wheezes, crackles or rales. Moving air well. EXTS No pitting edema noted.        Assessment & Plan:

## 2013-08-09 NOTE — Assessment & Plan Note (Signed)
Suspected early mild COPD exacerbation.  Plan to treat with steroid burst for 5 days (prednisone 20mg /day).  Not inclined to give abx in setting of apparent mild exacerbation. We are giving her a neb treatment today and she may take the tubing home to use with her home albuterol, which is also being refilled.  For prompt follow up in the office in the coming week. Discussed red flags that should prompt more urgent/emergent attention.

## 2013-08-09 NOTE — Patient Instructions (Signed)
It was a pleasure to see you today.  For your shortness of breath, I am starting you on Prednisone 20mg , take 2 tablets by mouth one time daily for 5 days.  I refilled your albuterol solution for your nebulizer, and we are giving you tubing for your home nebulizer.   I would like you to follow up in the coming week.

## 2013-08-12 ENCOUNTER — Encounter: Payer: Self-pay | Admitting: Family Medicine

## 2013-08-12 ENCOUNTER — Ambulatory Visit (INDEPENDENT_AMBULATORY_CARE_PROVIDER_SITE_OTHER): Payer: Medicare Other | Admitting: Family Medicine

## 2013-08-12 VITALS — BP 115/79 | HR 91 | Temp 98.9°F | Wt 171.0 lb

## 2013-08-12 DIAGNOSIS — J441 Chronic obstructive pulmonary disease with (acute) exacerbation: Secondary | ICD-10-CM

## 2013-08-12 NOTE — Progress Notes (Signed)
   Subjective:    Patient ID: [name], female    DOB: Jul 12, 1923, 80 y.o.   MRN: [id number]  HPI  COPD exacerbation Feeling better.  Started zpack she had at home 2 days ago.  Use albuterol last about 5 hours ago.  No wheezing now or shortness of breath or chest pain or fever.   Still coughing was yellow now is dry.  No edema  Chief Complaint noted Review of Symptoms - see HPI PMH - Smoking status noted.      Review of Systems     Objective:   Physical Exam Alert talkative well groomed Able to get up and down from exam table with no problems Heart - Regular rate and rhythm.  No murmurs, gallops or rubs.    Lungs:  Normal respiratory effort, chest expands symmetrically. Lungs are clear to auscultation with a few basilar crackles no wheezes. No edema        Assessment & Plan:  COPD exacerbation - improving.  No signs of pneumonia or CHF.  Finish antibiotics and prednisone Follow up as needed

## 2013-08-12 NOTE — Patient Instructions (Signed)
Good to see you today!  Thanks for coming in.  Stay in side in the [facility] all the zpack and prednisone  Call us if you are feeling any worse or fever   See Dr McDiarmid in July

## 2013-08-13 ENCOUNTER — Telehealth: Payer: Self-pay | Admitting: *Deleted

## 2013-08-13 NOTE — Telephone Encounter (Signed)
Received message from Dr. Tessie Fass requesting pt's last INR results and an order to stop coumadin due to pt's upcoming surgery to remove teeth.  Please give him a call at [phone] and fax results to [phone].  Derl Barrow, RN

## 2013-08-14 NOTE — Telephone Encounter (Signed)
  Spoke with Dr Terence Lux  Discusse would have increased risk of CVA with stopping warfarin  Decided best course to stop for 1 day to slightly lower INR given risk of significant bleeding is not really high

## 2013-08-16 ENCOUNTER — Ambulatory Visit: Payer: Medicare Other | Admitting: Family Medicine

## 2013-08-22 DIAGNOSIS — H353290 Exudative age-related macular degeneration, unspecified eye, stage unspecified: Secondary | ICD-10-CM | POA: Diagnosis not present

## 2013-08-23 DIAGNOSIS — H01009 Unspecified blepharitis unspecified eye, unspecified eyelid: Secondary | ICD-10-CM | POA: Diagnosis not present

## 2013-08-23 DIAGNOSIS — H02059 Trichiasis without entropian unspecified eye, unspecified eyelid: Secondary | ICD-10-CM | POA: Diagnosis not present

## 2013-08-30 ENCOUNTER — Other Ambulatory Visit: Payer: Self-pay | Admitting: Family Medicine

## 2013-09-05 ENCOUNTER — Ambulatory Visit (INDEPENDENT_AMBULATORY_CARE_PROVIDER_SITE_OTHER): Payer: Medicare Other | Admitting: *Deleted

## 2013-09-05 ENCOUNTER — Other Ambulatory Visit: Payer: Self-pay | Admitting: Family Medicine

## 2013-09-05 ENCOUNTER — Encounter: Payer: Self-pay | Admitting: Family Medicine

## 2013-09-05 ENCOUNTER — Ambulatory Visit (INDEPENDENT_AMBULATORY_CARE_PROVIDER_SITE_OTHER): Payer: Medicare Other | Admitting: Family Medicine

## 2013-09-05 VITALS — BP 148/86 | HR 80 | Temp 98.6°F | Wt 170.0 lb

## 2013-09-05 DIAGNOSIS — R079 Chest pain, unspecified: Secondary | ICD-10-CM | POA: Diagnosis not present

## 2013-09-05 DIAGNOSIS — I252 Old myocardial infarction: Secondary | ICD-10-CM | POA: Diagnosis not present

## 2013-09-05 DIAGNOSIS — I4891 Unspecified atrial fibrillation: Secondary | ICD-10-CM

## 2013-09-05 DIAGNOSIS — J449 Chronic obstructive pulmonary disease, unspecified: Secondary | ICD-10-CM

## 2013-09-05 DIAGNOSIS — I480 Paroxysmal atrial fibrillation: Secondary | ICD-10-CM

## 2013-09-05 LAB — POCT INR
INR: 1.8
INR: 1.8

## 2013-09-05 NOTE — Patient Instructions (Signed)
Shashana - Please re-start taking one whole metoprolol tablet (50 mg) daily.            If you do not get relief from your chest pain after taking one Nitroglycerin tablet, take a second Nitroglycerin tablet and call 911 for help.  Dr Dyasia Firestine will contact Dr Verdia Kuba office about getting you an appointment with Dr [name].

## 2013-09-06 ENCOUNTER — Encounter: Payer: Self-pay | Admitting: Family Medicine

## 2013-09-06 NOTE — Assessment & Plan Note (Signed)
Currently in regular rhythm. INR 1.8.  Patient has been taking 2 mg Warfarin every day instead of the recommended 2 mg every day except 3 mg on Mondays and Fridays. Pt instructed to take Warfarin 1 mg tablet, two tablets daily except 3 tablets on Mondays and Fridays.  RTC [facility] for INR check.  Will not plan on holding Warfarin for dental extractions on 7/27.

## 2013-09-06 NOTE — Assessment & Plan Note (Addendum)
Improved since recent AE-COPD event. Completed Azithromycin and prednisone taper therapy without significant adverse effects. Pt tolerating Advair and Spiriva maintenance therapy. Using albuterol neb/mdi less than 2 times a week currently. Will continue current regiment.

## 2013-09-06 NOTE — Progress Notes (Signed)
   Subjective:    Patient ID: [name], female    DOB: Feb 06, 1924, 80 y.o.   MRN: [id number]  HPI Problem List Items Addressed This Visit     Cardiovascular and Mediastinum   MYOCARDIAL INFARCTION, HX OF (Chronic) - Onset of nocturnal Substernal chest pain twice over last two weeks.  Last event night prior to this office visit.  Pain responded quickly (< 5 minutes) to one NTG SL each time.   First time chest pain occurred, patient also took one Ativan tablet.  - No current chest pain.  - Patient is on Warfarin (Goal 2.0 to 3.0) for paroxysmal Atrial Fibrillation.  - Patient is not on aspirin or plavix.  - Patient's metoprolol XL 50 mg tab daily was reduced to 25 mg daily 06/20/13 because of low BP. - Patient to go for elective dental extractions 09/23/13 to prepare patient for a full upper dental plate by Dr Terence Lux.  - Hospitalized for NSTEMI ( 10/02/2008). PCI (10/02/2008, Dr Daneen Schick) with RCA [place] placed: 99% lesion to 0% lesion. Circumflex Obtuse marginal with 90% stenosis (left for medical mangement for now).        Hospitalization (9/8-9/10, Dr Daneen Schick, III, Cardiology) for Paroxysmal Atrial Fibrillation with RVR and anginal pain secondary to demand/supply mismatch in setting of RVR with known circumflex artery branch disease.     AF (paroxysmal atrial fibrillation) - Primary (Chronic) - Longstanding issue for patient - Taking her Warfarin and metoprolol.  Denies bleeding/melena/hematochezia.  Denies dizziness/syncope. - No DOE, no orthopnea, No POST NASAL DRIP - Denies feeling palpitations.      Respiratory   COPD (Chronic) - Chronic problem for patient.  Followed by [facility] pulmonology. - No current cough, increase sputum or worsening SHORTNESS OF BREATH/DOE. - Taking Advair 250/50 twice daily, albuterol neb or MDI as needed (not needing to use for over a week), Tiotropium daily. - Recent treatment for AECOPD with Prednisone taper and Azithromycin.  - Moderate  stage COPD.         Review of Systems See HPI No fever No leg swelling.   No new leg pains     Objective:   Physical Exam VS reviewed GEN: Alert, Cooperative, Groomed, NAD HEENT: PERRL; EAC bilaterally not occluded, TM's translucent with normal LM, (+) LR; COR: RRR, No M/G/R, No JVD, Normal PMI size and location LUNGS: BCTA, No Acc mm use, speaking in full sentences ABDOMEN: (+)BS, soft, NT, ND, No HSM, No palpable masses EXT: No peripheral leg edema.  Neuro: Oriented to person, place, and time; Gait: Normal speed, No significant path deviation, Step through +,  Psych: Normal affect/thought/speech/language         Assessment & Plan:

## 2013-09-06 NOTE — Assessment & Plan Note (Addendum)
New problem. Two episodes of  nocturnal SSCP relieved within 5 minutes of NTG SL x 1 in both instances.  Know CAD Uncertain if the pain is cardiac in origin.  Recommendation Will request consultation with Dr [name] (Card). Will try to get cardiology consultation before patient scheduled for Dental extractions by Dr Terence Lux.  Continue Warfarin. Increase metoprolol XL to 50 mg daily. Patient advised to call [phone] if Chest pain not relieved within 5 minutes on one SL NTG.

## 2013-09-11 DIAGNOSIS — M79609 Pain in unspecified limb: Secondary | ICD-10-CM | POA: Diagnosis not present

## 2013-09-11 DIAGNOSIS — M6530 Trigger finger, unspecified finger: Secondary | ICD-10-CM | POA: Diagnosis not present

## 2013-09-11 DIAGNOSIS — M720 Palmar fascial fibromatosis [Dupuytren]: Secondary | ICD-10-CM | POA: Diagnosis not present

## 2013-09-13 ENCOUNTER — Ambulatory Visit (INDEPENDENT_AMBULATORY_CARE_PROVIDER_SITE_OTHER): Payer: Medicare Other | Admitting: Interventional Cardiology

## 2013-09-13 ENCOUNTER — Encounter: Payer: Self-pay | Admitting: Interventional Cardiology

## 2013-09-13 VITALS — BP 137/75 | HR 67 | Ht 64.5 in | Wt 165.0 lb

## 2013-09-13 DIAGNOSIS — I4891 Unspecified atrial fibrillation: Secondary | ICD-10-CM | POA: Diagnosis not present

## 2013-09-13 DIAGNOSIS — R072 Precordial pain: Secondary | ICD-10-CM | POA: Diagnosis not present

## 2013-09-13 DIAGNOSIS — I10 Essential (primary) hypertension: Secondary | ICD-10-CM

## 2013-09-13 DIAGNOSIS — Z7901 Long term (current) use of anticoagulants: Secondary | ICD-10-CM

## 2013-09-13 DIAGNOSIS — I495 Sick sinus syndrome: Secondary | ICD-10-CM | POA: Diagnosis not present

## 2013-09-13 DIAGNOSIS — I480 Paroxysmal atrial fibrillation: Secondary | ICD-10-CM

## 2013-09-13 NOTE — Progress Notes (Signed)
Patient ID: [name], female   DOB: [birth year], 80 y.o.   MRN: [id number]    [address] [place], [place]  [address] Phone: [phone] Fax:  [phone]  Date:  09/13/2013   ID:  [name], DOB 29-Apr-1923, MRN [id number]  PCP:  MCDIARMID,TODD D, MD   ASSESSMENT:  1. Chest discomfort, at rest, substernal and nocturnal relieved on one occasion with lorazepam and on the second occasion with 2 sublingual nitroglycerin. Etiology is uncertain but could represent gastroesophageal reflux, paroxysmal atrial fibrillation, or angina. Angina seems less likely given her maintenance of exertional tolerance and absence of exertional symptoms. 2. Known coronary artery disease with right coronary bare-metal stent, remote.  3. COPD/asthma 4. Paroxysmal atrial fibrillation, currently in atrially paced 5. Tachybradycardia syndrome with DDD pacemaker  PLAN:  1. Clinical observation 2. She is encouraged to call if recurrent prolonged chest discomfort 3. Clinical followup as needed   SUBJECTIVE: [name] is a 80 y.o. female who has coronary artery disease with right coronary bare-metal stent implantation in 2010. She has history of sick sinus syndrome with paroxysmal atrial fibrillation and permanent DDD pacemaker. She has stage III chronic kidney disease due to nephrectomy to do is also a history of Schatzki's ring.  One week ago the patient had 2 nights where she suddenly developed substernal pressure that awakened her from sleep. On the first night lorazepam was used. The discomfort gradually got better and she went back to sleep. It occurred again the next night and 2 nitroglycerin tablets were used and there was gradual resolution of the discomfort. The discomfort was more severe on the second occasion. Since that time one week ago, she has had no recurrence. She has remained very active including swimming 3 times since that occasion. Activity is now brought about any discomfort,  dyspnea, or revealed any reduction in exertional tolerance. She saw Dr. McDiarmid a week ago when he felt that she should have cardiac evaluation and this appointment was made. . She feels fine today. She is ablating all home. She walked into the office quite briskly and had no limitations and denies shortness of breath.   Wt Readings from Last 3 Encounters:  09/13/13 165 lb (74.844 kg)  09/05/13 170 lb (77.111 kg)  08/12/13 171 lb (77.565 kg)     Past Medical History  Diagnosis Date  . Candidiasis of the esophagus 11/26/2007  . Myocardial infarct, old   . COPD, severe   . Sick sinus syndrome with tachycardia   . Pacemaker   . Chronic kidney disease (CKD), stage III (moderate)   . Hypertension   . Acute appendicitis with rupture   . Spinal stenosis, lumbar   . Adrenal adenoma     Incidentaloma  . Spondylolisthesis of lumbar region   . Lumbar herniated disc     History of HNP L4/5 in 2003  . Hx of colonoscopy with polypectomy 04/27/2010    Dr Cristina Gong found three  tubular adenomas each less than 10 mm size  . Retinal hemorrhage of left eye 06/2010  . Cataract 2013    Bilateral   . Abnormal mammogram, unspecified 08/23/2010    Followup imaging reassuring.  Repeat in 6 months.   Marland Kitchen DISC WITH RADICULOPATHY 04/27/2006    Qualifier: Diagnosis of  By: McDiarmid MD, Sherren Mocha    . Transitional cell carcinoma of ureter, history   . Mild cognitive impairment 10/26/2012    (10/25/12) Failed MiniCog screen  . 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 Normal uric acid serum level at time of attack.    Marland Kitchen COPD (chronic obstructive pulmonary disease)   . EDEMA-LEGS,DUE TO VENOUS OBSTRUCT. 04/27/2006    Qualifier: Diagnosis of  By: McDiarmid MD, Sherren Mocha    . ANXIETY 04/27/2006    Qualifier: Diagnosis of  By: McDiarmid MD, Sherren Mocha    . HERNIA, HIATAL, NONCONGENITAL 04/27/2006    Qualifier: Diagnosis of  By: McDiarmid MD, Sherren Mocha    .  History of Hemorrhoids 04/27/2006    Qualifier: Diagnosis of  By: McDiarmid MD, Sherren Mocha    . RHINITIS, ALLERGIC 04/27/2006    Qualifier: Diagnosis of  By: McDiarmid MD, Sherren Mocha    . SCHATZKI'S RING, HX OF 11/26/2007    Qualifier: Diagnosis of  By: McDiarmid MD, Sherren Mocha  An EGD was performed by Dr Cristina Gong on 04/27/2010 for iron deficiency anemia. There was a a transient hiatal hernia with Schatzki's ring. Stomach and duodenum were normal. EGD on 03/06/12 by Dr Cristina Gong for IDA non-obstructing Schatzki's ring at Gastroesophageal junction, otherwise normal esophagus and stomach.    . Urge incontinence 12/13/2011    Diagnosed in 10/2011 by Dr Bjorn Loser (Urology)   . VITAMIN B12 DEFICIENCY 10/07/2009    Qualifier: Diagnosis of  By: McDiarmid MD, Sherren Mocha  Dx based on a post-TKR anemia work-up Low normal serum B12 with high Methylmalonic acid and homocysteine level  Vit B12 serum level (10/28/10) > 1500 pg/mL   . Vitamin D deficiency 11/02/2010    Serum vitamin D 25(OH) = 10.9 ng/mL (30 -100) on 10/28/10 c/w Vitamin D deficiency.     . AF (paroxysmal atrial fibrillation) 11/11/2010    Hospitalization (9/8-9/10, Dr Daneen Schick, III, Cardiology) for Paroxysmal Atrial Fibrillation with RVR and anginal pain secondary to demand/supply mismatch in setting of RVR with known circumflex artery branch disease.    Marland Kitchen HYPERTENSION, BENIGN ESSENTIAL 06/18/2007    Qualifier: Diagnosis of  By: McDiarmid MD, Sherren Mocha    . Iron deficiency anemia 08/05/2010    Dr Cristina Gong (GI) has evaluated with EGD, colonoscopy, and video capsular endoscopy in 2011 & 2012.  All have been unrevealing as to an origin of IDA.  OV with Dr Cristina Gong (10/28/10) assessment of blood in stool per hemoccult and GER. Hbg 12.1 g/dL, MCV 91.8, Ferritin 30 ng/mL. Patient taking on ferrous sulfate tab daily.   EGD on 03/06/12 by Dr Cristina Gong for IDA non-obstructing Schatzki's ring at Gastroesophageal junction, otherwise normal esophagus and stomach.     . Macular degeneration, bilateral  10/04/2010    Right eye is wet MD, the other is dry macular degeneration (ARMD). Pt undergoing some form of vascular endothelial growth factor inhibition intraocular therapy.    . VENTRICULAR HYPERTROPHY, LEFT 08/28/2008    Qualifier: Diagnosis of  By: McDiarmid MD, Sherren Mocha    . CHRONIC KIDNEY DISEASE STAGE III (MODERATE) 09/02/2009    Patient with Solitary Kidney S/P total Nephrectomy for transitional cell caner.    Marland Kitchen COPD 04/27/2006    Qualifier: Diagnosis of  By: McDiarmid MD, Sherren Mocha    . MYOCARDIAL INFARCTION, HX OF 09/25/2008    Qualifier: Diagnosis of  By: McDiarmid MD, Todd  Hospitalized for NSTEMI ( 10/02/2008). PCI (10/02/2008, Dr Daneen Schick) with RCA [place] placed: 99% lesion to 0% lesion. Circumflex Obtuse marginal with 90% stenosis (left for medical mangement for now)   Hospitalization (9/8-9/10, Dr Daneen Schick,  III, Cardiology) for Paroxysmal Atrial Fibrillation with RVR and anginal pain secondary to demand/supply mismatch in setting of RVR with known circumflex artery branch disease.    Marland Kitchen SICK SINUS SYNDROME 04/27/2006    Qualifier: Diagnosis of  By: McDiarmid MD, Sherren Mocha  S/P dual chamber pacemaker placement by Dr Osie Cheeks (EPS-Card) with pacemaker lead extraction & reinsertion - 07/25/2003  Hospitalization (9/8-9/10, Dr Daneen Schick, III, Cardiology) for Paroxysmal Atrial Fibrillation with RVR and anginal pain secondary to demand/supply mismatch in setting of RVR with known circumflex artery branch disease.    . Solitary kidney, acquired 05/17/2010    Surgical removal for transitional cell cancer by Tresa Endo, MD (Urol). Surveillance cystoscopy by Dr Alinda Money [facility]) on 10/19/12 without evidence of cystoscopic recurrence. Recommend RTC one year for cystoscopy.   . ADENOMATOUS COLONIC POLYP 03/01/2003    Qualifier: Diagnosis of  By: McDiarmid MD, Sherren Mocha Multiple benign polyps of cecum, ascending, transverse and sigmoid colon by 8/09 colonoscopy by Dr Cristina Gong  Colonoscopy by Dr Cristina Gong  for iron-deficiency anemia on 04/27/2010 showed three sessile polyps that were in ascending (3 mm x 9 mm), transverse (4 mm), and cecum (3 mm).  All three were tubular adenomas that were negative for high grade dysplasia or malignancy on pathology. Dr Cristina Gong called the polpys benign and not requiring follow-up in view of the patients age.    . Soft tissue injury of foot 05/03/2011  . PREDIABETES 09/11/2007    Qualifier: Diagnosis of  By: McDiarmid MD, Sherren Mocha    . Numbness and tingling in hands 07/21/2011  . MUSCLE CRAMPS 03/11/2010    Qualifier: Diagnosis of  By: McDiarmid MD, Sherren Mocha    . Mammogram abnormal 02/15/2011  . LUMBAR SPINAL STENOSIS 04/27/2006    Qualifier: Diagnosis of  By: McDiarmid MD, Francisca December HNP w/ L4&5 root encroachment - 05/24/2001,   Spinal Stenosis: L-S MRI: L4-5 Spinal stenosis, - 03/03/2006, Spondylolithesis L5-S1(mild), DJD L4-5 on X-Ray 11/04   . Leg cramps 05/29/2012  . 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 Normal uric acid serum level at time of attack.    . Cellulitis of leg, right 10/01/2012  . Solar lentigo 06/15/2012    Current Outpatient Prescriptions  Medication Sig Dispense Refill  . ADVAIR DISKUS 250-50 MCG/DOSE AEPB 2 puffs every 6 (six) hours as needed.      Marland Kitchen albuterol (PROVENTIL) (2.5 MG/3ML) 0.083% nebulizer solution Take 3 mLs (2.5 mg total) by nebulization every 6 (six) hours as needed for wheezing or shortness of breath.  75 mL  4  . albuterol (VENTOLIN HFA) 108 (90 BASE) MCG/ACT inhaler Inhale 2 puffs into the lungs every 6 (six) hours as needed. If needed for shortness of breath.  1 Inhaler  3  . cholecalciferol (VITAMIN D) 1000 UNITS tablet Take 1,000 Units by mouth daily.      . cyclobenzaprine (FLEXERIL) 5 MG tablet TAKE 1 TABLET THREE TIMES DAILY AS NEEDED.  30 tablet  2  . ferrous sulfate 325 (65 FE) MG tablet Take 325 mg by mouth daily.      . furosemide (LASIX) 40 MG tablet  Take 40 mg by mouth daily as needed for fluid.      Marland Kitchen LORazepam (ATIVAN) 0.5 MG tablet Take 0.5 mg by mouth daily as needed for anxiety.      . metoprolol succinate (TOPROL-XL) 50 MG 24 hr tablet TAKE 1 TABLET  BY MOUTH EVERY DAY WITH FOOD  90 tablet  3  . NITROSTAT 0.4 MG SL tablet PLACE ONE TABLET UNDER THE TONGUE EVERY FIVE MINUTES AS NEEDED.  25 tablet  3  . omeprazole (PRILOSEC) 40 MG capsule TAKE ONE CAPSULE BY MOUTH AS NEEDED      . omeprazole (PRILOSEC) 40 MG capsule TAKE 1 CAPSULE BY MOUTH EVERY DAY  30 capsule  0  . predniSONE (DELTASONE) 20 MG tablet Take 2 tablets (40 mg total) by mouth daily with breakfast.  10 tablet  0  . simvastatin (ZOCOR) 40 MG tablet TAKE 1 TABLET BY MOUTH EVERY MORNING  30 tablet  0  . tiotropium (SPIRIVA) 18 MCG inhalation capsule Place 1 capsule (18 mcg total) into inhaler and inhale daily.  30 capsule  PRN  . vitamin B-12 (CYANOCOBALAMIN) 1000 MCG tablet Take 1,000 mcg by mouth daily.       Marland Kitchen warfarin (COUMADIN) 1 MG tablet Take 2 mg by mouth daily.        No current facility-administered medications for this visit.    Allergies:    Allergies  Allergen Reactions  . Codeine Phosphate Itching    REACTION: unspecified  . Hydrochlorothiazide Other (See Comments)    Possible acute gouty arthritis of wrist  . Montelukast Sodium     REACTION: unspecified    Social History:  The patient  reports that she has quit smoking. Her smoking use included Cigarettes. She smoked 0.00 packs per day. She has never used smokeless tobacco. She reports that she drinks about 3 ounces of alcohol per week. She reports that she does not use illicit drugs.   ROS:  Please see the history of present illness.   Denies syncope or palpitations   All other systems reviewed and negative.   OBJECTIVE: VS:  BP 137/75  Pulse 67  Ht 5' 4.5" (1.638 m)  Wt 165 lb (74.844 kg)  BMI 27.90 kg/m2 Well nourished, well developed, in no acute distress, elderly HEENT: normal Neck: JVD  flat. Carotid bruit absent  Cardiac:  normal S1, S2; RRR; no murmur Lungs:  clear to auscultation bilaterally, no wheezing, rhonchi or rales Abd: soft, nontender, no hepatomegaly Ext: Edema absent. Pulses 2+ Skin: warm and dry Neuro:  CNs 2-12 intact, no focal abnormalities noted  EKG:  Atrial pacing with interventricular conduction abnormality and lateral T wave change. No significant variance from her last tracing at [facility] and 2014.       Signed, Illene Labrador III, MD 09/13/2013 2:06 PM

## 2013-09-13 NOTE — Patient Instructions (Signed)
Your physician recommends that you continue on your current medications as directed. Please refer to the Current Medication list given to you today.  Your physician recommends that you schedule a follow-up appointment in: with Dr [name] if any chest pain issues by calling office for an appointment

## 2013-09-17 ENCOUNTER — Other Ambulatory Visit: Payer: Self-pay | Admitting: Family Medicine

## 2013-09-19 ENCOUNTER — Telehealth: Payer: Self-pay | Admitting: *Deleted

## 2013-09-19 ENCOUNTER — Ambulatory Visit (INDEPENDENT_AMBULATORY_CARE_PROVIDER_SITE_OTHER): Payer: Medicare Other | Admitting: *Deleted

## 2013-09-19 ENCOUNTER — Encounter: Payer: Self-pay | Admitting: *Deleted

## 2013-09-19 DIAGNOSIS — I4891 Unspecified atrial fibrillation: Secondary | ICD-10-CM | POA: Diagnosis not present

## 2013-09-19 DIAGNOSIS — I480 Paroxysmal atrial fibrillation: Secondary | ICD-10-CM

## 2013-09-19 LAB — POCT INR: INR: 2.5

## 2013-09-19 NOTE — Telephone Encounter (Signed)
Received message from Dr. Terence Lux wanting to know if it was still ok to stop pt's coumadin x 1 day prior to surgery.  Pt is scheduled one day next week.  He also requested most recent INR result to be faxed to his office.  INR faxed to [phone].  Please give him a call if ok to stop coumadin one day prior to surgery.  Office number [phone].  Derl Barrow, RN

## 2013-09-19 NOTE — Telephone Encounter (Signed)
Dr. Jewel Baize nurse informed and results/phone note faxed so it could be placed in her chart. Fleeger, Salome Spotted

## 2013-09-19 NOTE — Telephone Encounter (Signed)
Please let Dr Terence Lux know that it is OK for [name] to stop her Coumadin one day prior to surgery.

## 2013-09-19 NOTE — Progress Notes (Signed)
Prior Authorization received from [place] for cyclobenzaprine 5 mg tablet. Formulary and PA form placed in provider box for completion. Derl Barrow, RN

## 2013-09-20 NOTE — Progress Notes (Signed)
INR 2.5 3 mg warfarin Mon & Fri 2 mg all other days Recheck in 3 weeks.

## 2013-09-24 ENCOUNTER — Encounter: Payer: Self-pay | Admitting: Family Medicine

## 2013-09-24 ENCOUNTER — Telehealth: Payer: Self-pay | Admitting: Family Medicine

## 2013-09-24 DIAGNOSIS — M62830 Muscle spasm of back: Secondary | ICD-10-CM

## 2013-09-24 DIAGNOSIS — M545 Low back pain, unspecified: Secondary | ICD-10-CM | POA: Insufficient documentation

## 2013-09-24 HISTORY — DX: Muscle spasm of back: M62.830

## 2013-09-24 NOTE — Telephone Encounter (Signed)
Pt called and would like a refill on her Spiriva called in. jw

## 2013-09-24 NOTE — Progress Notes (Signed)
PA form completed by provider and faxed to [facility] for review.  Derl Barrow, RN

## 2013-09-25 MED ORDER — TIOTROPIUM BROMIDE MONOHYDRATE 18 MCG IN CAPS: 18.0000 ug | ORAL_CAPSULE | Freq: Every day | RESPIRATORY_TRACT | Status: DC

## 2013-09-25 NOTE — Telephone Encounter (Signed)
Spiriva refilled

## 2013-09-26 DIAGNOSIS — Z01419 Encounter for gynecological examination (general) (routine) without abnormal findings: Secondary | ICD-10-CM | POA: Diagnosis not present

## 2013-09-30 NOTE — Progress Notes (Signed)
PA approved for cyclobenzaprine from [facility].  [facility] informed.  PA authorized until 02/27/2014.  Derl Barrow, RN

## 2013-10-02 DIAGNOSIS — Z1231 Encounter for screening mammogram for malignant neoplasm of breast: Secondary | ICD-10-CM | POA: Diagnosis not present

## 2013-10-10 ENCOUNTER — Ambulatory Visit (INDEPENDENT_AMBULATORY_CARE_PROVIDER_SITE_OTHER): Payer: Medicare Other | Admitting: *Deleted

## 2013-10-10 DIAGNOSIS — H353290 Exudative age-related macular degeneration, unspecified eye, stage unspecified: Secondary | ICD-10-CM | POA: Diagnosis not present

## 2013-10-10 DIAGNOSIS — I480 Paroxysmal atrial fibrillation: Secondary | ICD-10-CM

## 2013-10-10 DIAGNOSIS — I4891 Unspecified atrial fibrillation: Secondary | ICD-10-CM

## 2013-10-10 LAB — POCT INR: INR: 1.3

## 2013-10-10 NOTE — Progress Notes (Signed)
I agree with this Warfarin dosing with Recheck next week.

## 2013-10-14 DIAGNOSIS — M6530 Trigger finger, unspecified finger: Secondary | ICD-10-CM | POA: Diagnosis not present

## 2013-10-14 DIAGNOSIS — M720 Palmar fascial fibromatosis [Dupuytren]: Secondary | ICD-10-CM | POA: Diagnosis not present

## 2013-10-15 ENCOUNTER — Ambulatory Visit (INDEPENDENT_AMBULATORY_CARE_PROVIDER_SITE_OTHER): Payer: Medicare Other | Admitting: *Deleted

## 2013-10-15 DIAGNOSIS — I480 Paroxysmal atrial fibrillation: Secondary | ICD-10-CM

## 2013-10-15 DIAGNOSIS — H353290 Exudative age-related macular degeneration, unspecified eye, stage unspecified: Secondary | ICD-10-CM | POA: Diagnosis not present

## 2013-10-15 DIAGNOSIS — I4891 Unspecified atrial fibrillation: Secondary | ICD-10-CM | POA: Diagnosis not present

## 2013-10-15 LAB — POCT INR: INR: 1.4

## 2013-10-16 NOTE — Progress Notes (Signed)
I reviewed lab result and made warfarin dose change recommendation.

## 2013-10-17 ENCOUNTER — Ambulatory Visit (INDEPENDENT_AMBULATORY_CARE_PROVIDER_SITE_OTHER): Payer: Medicare Other | Admitting: Family Medicine

## 2013-10-17 ENCOUNTER — Other Ambulatory Visit: Payer: Self-pay | Admitting: Family Medicine

## 2013-10-17 ENCOUNTER — Encounter: Payer: Self-pay | Admitting: Family Medicine

## 2013-10-17 VITALS — BP 126/72 | HR 84 | Temp 98.7°F | Ht 64.5 in | Wt 170.0 lb

## 2013-10-17 DIAGNOSIS — R5381 Other malaise: Secondary | ICD-10-CM | POA: Diagnosis not present

## 2013-10-17 DIAGNOSIS — N183 Chronic kidney disease, stage 3 unspecified: Secondary | ICD-10-CM | POA: Diagnosis not present

## 2013-10-17 DIAGNOSIS — R7309 Other abnormal glucose: Secondary | ICD-10-CM | POA: Diagnosis not present

## 2013-10-17 DIAGNOSIS — I4891 Unspecified atrial fibrillation: Secondary | ICD-10-CM

## 2013-10-17 DIAGNOSIS — M5380 Other specified dorsopathies, site unspecified: Secondary | ICD-10-CM

## 2013-10-17 DIAGNOSIS — Z23 Encounter for immunization: Secondary | ICD-10-CM | POA: Diagnosis not present

## 2013-10-17 DIAGNOSIS — I480 Paroxysmal atrial fibrillation: Secondary | ICD-10-CM

## 2013-10-17 DIAGNOSIS — I10 Essential (primary) hypertension: Secondary | ICD-10-CM

## 2013-10-17 DIAGNOSIS — D509 Iron deficiency anemia, unspecified: Secondary | ICD-10-CM

## 2013-10-17 DIAGNOSIS — R5383 Other fatigue: Secondary | ICD-10-CM

## 2013-10-17 DIAGNOSIS — I252 Old myocardial infarction: Secondary | ICD-10-CM | POA: Diagnosis not present

## 2013-10-17 DIAGNOSIS — M62830 Muscle spasm of back: Secondary | ICD-10-CM

## 2013-10-17 LAB — BASIC METABOLIC PANEL
BUN: 21 mg/dL (ref 6–23)
CO2: 28 mEq/L (ref 19–32)
CREATININE: 1.11 mg/dL — AB (ref 0.50–1.10)
Calcium: 8.5 mg/dL (ref 8.4–10.5)
Chloride: 105 mEq/L (ref 96–112)
Glucose, Bld: 77 mg/dL (ref 70–99)
Potassium: 3.9 mEq/L (ref 3.5–5.3)
Sodium: 142 mEq/L (ref 135–145)

## 2013-10-17 LAB — CBC
HCT: 38.8 % (ref 36.0–46.0)
Hemoglobin: 12.7 g/dL (ref 12.0–15.0)
MCH: 30.2 pg (ref 26.0–34.0)
MCHC: 32.7 g/dL (ref 30.0–36.0)
MCV: 92.4 fL (ref 78.0–100.0)
Platelets: 236 10*3/uL (ref 150–400)
RBC: 4.2 MIL/uL (ref 3.87–5.11)
RDW: 13.4 % (ref 11.5–15.5)
WBC: 6.4 10*3/uL (ref 4.0–10.5)

## 2013-10-17 LAB — POCT GLYCOSYLATED HEMOGLOBIN (HGB A1C): Hemoglobin A1C: 5.9

## 2013-10-17 MED ORDER — BACLOFEN 10 MG PO TABS: 10.0000 mg | ORAL_TABLET | Freq: Three times a day (TID) | ORAL | Status: DC

## 2013-10-17 NOTE — Patient Instructions (Addendum)
We changed your cyclobenzaprine (Flexeril) to Baclofen for muscle Spasms.  If this new medication does not work for you, let Dr Wendy Poet know so he can switch you back to cyclobenzaprine.  Perform your neck exerccises three times a day.  #3 repetitions at a time.  This is too help control the pinched nerve in your neck.  You received your second pneumonia vaccination today  We are checking you Hemoglobin and kidneys today.   Your blood sugar test was still in the pre-diabetes range, but has not worsened in over three years.  Good Job!  Pick up Transversal patches to treat the warts on your thighs.  It will likely take 2 months of daily use to dissolve the bumps.

## 2013-10-18 ENCOUNTER — Encounter: Payer: Self-pay | Admitting: Family Medicine

## 2013-10-18 LAB — FERRITIN: FERRITIN: 179 ng/mL (ref 10–291)

## 2013-10-18 NOTE — Assessment & Plan Note (Signed)
Adequate blood pressure control.  No evidence of new end organ damage.  Tolerating medication without significant adverse effects.  Plan to continue current blood pressure regiment.   

## 2013-10-18 NOTE — Assessment & Plan Note (Signed)
Stable.  Secondary to spondylosis.  Patient agreeable to trial of Baclofen instead of cyclobenzaprine for intermittent back spasm as Baclofen not on [place] [employer].

## 2013-10-18 NOTE — Progress Notes (Signed)
   Subjective:    Patient ID: [name], female    DOB: [birth year], 80 y.o.   MRN: [id number] Patient is unaccompanied. Patient and medical record is source of clinical information for visit.  HPI Problem List Items Addressed This Visit     High   MYOCARDIAL INFARCTION, HX OF (Chronic) - No recurrence of chest pain since last office visit - Dr [name] (Card) thought chest pain noted at last visit was not likely related to myocardial ischemia.  - Pt has not needed to use NTG SL since last office visit.  Metoprolol XL was increased back to 50 mg daily at last [facility] office visit. - No DOE, No upper gi symptoms.    CHRONIC KIDNEY DISEASE STAGE III (MODERATE) (Chronic) - Longstanding problem - No use of NSIADS - No edema, nause, fatigue.     AF (paroxysmal atrial fibrillation) (Chronic) - Longstanding issue - No palpitations, no chest pain.  No limb or facial weakness or numbness.  No speech difficulties.  - On warfarin.  Undergoing Re-indcution of anticoagualtion after warfarin held for dental extraction. - Last INR. 1.4 yesterday.  Increase slight in total daily warfarin dose to 17 mg weekly. - No bleeding.       Medium   PREDIABETES - Primary (Chronic) - Longstanding issue - Diet controlled. - No poluuria, no polydipsia.     Iron deficiency anemia - Longstanding px secondary to occult presumed intestinal AVM bleeds - Taking a iron tablet daily - No melena, no hematochezia.  No lightheadedness.   HYPERTENSION, BENIGN ESSENTIAL (Chronic) Disease Monitoring  Blood pressure range: not checking BP at home  Chest pain: no   Dyspnea: no   Claudication: no   Medication compliance: yes, taking metoprolol XL 50 mg daily  Medication Side Effects  Lightheadedness: no   Urinary frequency: no   Edema: no    Preventitive Healthcare:  Exercise: yes   Diet Pattern: low salt         Unprioritized   Muscle spasm of back - Longstanding problem - Intermittent, no progression in  symptoms - Lumbar location - Adequately Responsive to Cyclobenzaprine 5 mg in past. - Denies confusion or falls         Past medical history:I have reviewed and confirmed the past medical history in the chart. Medications: reviewed medication list in the chart Allergies: reviewed allergy section in the chart Review of Systems: Negative for chest pain and shortness of breath Review of all other systems is negative except for those mentioned above in HPI    Review of Systems     Objective:   Physical Exam VS reviewed GEN: Alert, Cooperative, Groomed, NAD HEENT: PERRL; EAC bilaterally not occluded, TM's translucent with normal LM, (+) LR;                No cervical LAN, No thyromegaly, No palpable masses COR: RRR, No M/G/R, No JVD, Normal PMI size and location LUNGS: BCTA, No Acc mm use, speaking in full sentences ABDOMEN: (+)BS, soft, NT EXT: No peripheral leg edema. Feet without deformity or lesions. Palpable bilateral pedal pulses.  Neuro: Oriented to person, place, and time;  Gait: Normal speed, No significant path deviation, Step through +,  Psych: Normal affect/thought/speech/language        Assessment & Plan:

## 2013-10-18 NOTE — Assessment & Plan Note (Signed)
Lab Results  Component Value Date   WBC 6.4 10/17/2013   HGB 12.7 10/17/2013   HCT 38.8 10/17/2013   MCV 92.4 10/17/2013   PLT 236 10/17/2013   Lab Results  Component Value Date   FERRITIN 179 10/17/2013   Stable Hemoglobin. Much improved Ferritin.  May consider trial off daily iron tablet at next office visit.  Stable Hemoglobin.

## 2013-10-18 NOTE — Assessment & Plan Note (Signed)
Lab Results  Component Value Date   CREATININE 1.11* 10/17/2013   BUN 21 10/17/2013   NA 142 10/17/2013   K 3.9 10/17/2013   CL 105 10/17/2013   CO2 28 10/17/2013  Stable renal function.  Monitor periodically in future.

## 2013-10-18 NOTE — Assessment & Plan Note (Signed)
Lab Results  Component Value Date   HGBA1C 5.9 10/17/2013  Stable A1c over last several years. Monitor A1c periodically or if change in health.

## 2013-10-18 NOTE — Assessment & Plan Note (Signed)
No recurrence of chest pain. On slightly higher dose of Metoprolol XL 50 mg daily .  Tolerating higher dose.  Will continue. Dr [name] (card) did not think episodes fo chest pain were myocardiac in origin.

## 2013-10-18 NOTE — Assessment & Plan Note (Signed)
Currently in regular rhythm.  DDD pacemaker in place.  INR 1.4 currently.  Increase 1 mg weekly to 17 mg weekly.  Return to [facility] lab in one week to recheck INR.

## 2013-10-23 DIAGNOSIS — Z8553 Personal history of malignant neoplasm of renal pelvis: Secondary | ICD-10-CM | POA: Diagnosis not present

## 2013-10-23 DIAGNOSIS — Z8551 Personal history of malignant neoplasm of bladder: Secondary | ICD-10-CM | POA: Diagnosis not present

## 2013-10-23 DIAGNOSIS — R82998 Other abnormal findings in urine: Secondary | ICD-10-CM | POA: Diagnosis not present

## 2013-10-24 ENCOUNTER — Ambulatory Visit (INDEPENDENT_AMBULATORY_CARE_PROVIDER_SITE_OTHER): Payer: Medicare Other | Admitting: *Deleted

## 2013-10-24 DIAGNOSIS — I4891 Unspecified atrial fibrillation: Secondary | ICD-10-CM

## 2013-10-24 DIAGNOSIS — I480 Paroxysmal atrial fibrillation: Secondary | ICD-10-CM

## 2013-10-24 LAB — POCT INR: INR: 2

## 2013-10-25 NOTE — Progress Notes (Signed)
Lab Results  Component Value Date   INR 2.0 10/24/2013   INR 1.4 10/15/2013   INR 1.3 10/10/2013   Adequate anticoagulation with Warfarin of 17 mg week for stroke and systemic embolization prevention in paroxysmal Atrial Fibrillation.  Continue Warfarin 2 mg daily except 3 mg on M, W, F. RTC 3 weeks for INR monitoring

## 2013-10-30 ENCOUNTER — Other Ambulatory Visit: Payer: Self-pay | Admitting: Family Medicine

## 2013-10-30 DIAGNOSIS — Z7901 Long term (current) use of anticoagulants: Secondary | ICD-10-CM

## 2013-10-30 DIAGNOSIS — R233 Spontaneous ecchymoses: Secondary | ICD-10-CM

## 2013-10-30 DIAGNOSIS — R238 Other skin changes: Secondary | ICD-10-CM

## 2013-10-31 ENCOUNTER — Ambulatory Visit (INDEPENDENT_AMBULATORY_CARE_PROVIDER_SITE_OTHER): Payer: Medicare Other | Admitting: *Deleted

## 2013-10-31 DIAGNOSIS — I4891 Unspecified atrial fibrillation: Secondary | ICD-10-CM

## 2013-10-31 DIAGNOSIS — I480 Paroxysmal atrial fibrillation: Secondary | ICD-10-CM

## 2013-10-31 LAB — POCT INR: INR: 2.5

## 2013-11-01 NOTE — Progress Notes (Signed)
Reviewed INR.  Agree with dosing and schedule recommended and time to monitoring of INR.

## 2013-11-12 ENCOUNTER — Ambulatory Visit: Payer: Medicare Other

## 2013-11-13 ENCOUNTER — Ambulatory Visit (INDEPENDENT_AMBULATORY_CARE_PROVIDER_SITE_OTHER): Payer: Medicare Other | Admitting: *Deleted

## 2013-11-13 DIAGNOSIS — I4891 Unspecified atrial fibrillation: Secondary | ICD-10-CM

## 2013-11-13 DIAGNOSIS — I480 Paroxysmal atrial fibrillation: Secondary | ICD-10-CM

## 2013-11-13 LAB — POCT INR: INR: 2.5

## 2013-11-14 ENCOUNTER — Other Ambulatory Visit: Payer: Self-pay | Admitting: Family Medicine

## 2013-11-18 ENCOUNTER — Other Ambulatory Visit: Payer: Self-pay | Admitting: Family Medicine

## 2013-11-21 DIAGNOSIS — H43819 Vitreous degeneration, unspecified eye: Secondary | ICD-10-CM | POA: Diagnosis not present

## 2013-11-21 DIAGNOSIS — H353290 Exudative age-related macular degeneration, unspecified eye, stage unspecified: Secondary | ICD-10-CM | POA: Diagnosis not present

## 2013-11-24 ENCOUNTER — Other Ambulatory Visit: Payer: Self-pay | Admitting: Family Medicine

## 2013-11-25 ENCOUNTER — Other Ambulatory Visit: Payer: Self-pay | Admitting: Family Medicine

## 2013-11-28 ENCOUNTER — Telehealth: Payer: Self-pay | Admitting: Family Medicine

## 2013-11-28 DIAGNOSIS — D509 Iron deficiency anemia, unspecified: Secondary | ICD-10-CM

## 2013-11-28 NOTE — Telephone Encounter (Signed)
Has really been dragging and having no energy the last several days. Would like to come in today to have her hemoglobin checked

## 2013-11-29 ENCOUNTER — Ambulatory Visit (INDEPENDENT_AMBULATORY_CARE_PROVIDER_SITE_OTHER): Payer: Medicare Other | Admitting: *Deleted

## 2013-11-29 DIAGNOSIS — I480 Paroxysmal atrial fibrillation: Secondary | ICD-10-CM

## 2013-11-29 DIAGNOSIS — D509 Iron deficiency anemia, unspecified: Secondary | ICD-10-CM | POA: Diagnosis not present

## 2013-11-29 LAB — CBC
HEMATOCRIT: 39.7 % (ref 36.0–46.0)
HEMOGLOBIN: 13.3 g/dL (ref 12.0–15.0)
MCH: 30.6 pg (ref 26.0–34.0)
MCHC: 33.5 g/dL (ref 30.0–36.0)
MCV: 91.3 fL (ref 78.0–100.0)
Platelets: 274 10*3/uL (ref 150–400)
RBC: 4.35 MIL/uL (ref 3.87–5.11)
RDW: 13.7 % (ref 11.5–15.5)
WBC: 6.5 10*3/uL (ref 4.0–10.5)

## 2013-11-29 LAB — POCT INR: INR: 2.5

## 2013-11-29 NOTE — Telephone Encounter (Signed)
She will come in this AM. Fleeger, Salome Spotted

## 2013-11-29 NOTE — Telephone Encounter (Signed)
Cbc ordered  Please ask patient to come in this AM for check  Thanks  [place]

## 2013-12-02 ENCOUNTER — Other Ambulatory Visit: Payer: Self-pay | Admitting: Family Medicine

## 2013-12-02 NOTE — Telephone Encounter (Signed)
Refilled patient's self-initiated antibiotics, Z-pak, for AE-COPD events.  Please let pt know refills of Z-pak sent to her pharmacy.

## 2013-12-07 ENCOUNTER — Other Ambulatory Visit: Payer: Self-pay | Admitting: Family Medicine

## 2013-12-12 ENCOUNTER — Encounter: Payer: Self-pay | Admitting: Family Medicine

## 2013-12-12 ENCOUNTER — Ambulatory Visit (INDEPENDENT_AMBULATORY_CARE_PROVIDER_SITE_OTHER): Payer: Medicare Other | Admitting: *Deleted

## 2013-12-12 ENCOUNTER — Ambulatory Visit (INDEPENDENT_AMBULATORY_CARE_PROVIDER_SITE_OTHER): Payer: Medicare Other | Admitting: Family Medicine

## 2013-12-12 ENCOUNTER — Ambulatory Visit: Payer: Medicare Other

## 2013-12-12 VITALS — BP 113/75 | HR 103 | Temp 98.3°F | Wt 161.0 lb

## 2013-12-12 DIAGNOSIS — I10 Essential (primary) hypertension: Secondary | ICD-10-CM

## 2013-12-12 DIAGNOSIS — R634 Abnormal weight loss: Secondary | ICD-10-CM

## 2013-12-12 DIAGNOSIS — Z950 Presence of cardiac pacemaker: Secondary | ICD-10-CM

## 2013-12-12 DIAGNOSIS — I480 Paroxysmal atrial fibrillation: Secondary | ICD-10-CM | POA: Diagnosis not present

## 2013-12-12 DIAGNOSIS — Z7901 Long term (current) use of anticoagulants: Secondary | ICD-10-CM

## 2013-12-12 DIAGNOSIS — L821 Other seborrheic keratosis: Secondary | ICD-10-CM

## 2013-12-12 DIAGNOSIS — Z79899 Other long term (current) drug therapy: Secondary | ICD-10-CM

## 2013-12-12 DIAGNOSIS — D509 Iron deficiency anemia, unspecified: Secondary | ICD-10-CM

## 2013-12-12 DIAGNOSIS — Z23 Encounter for immunization: Secondary | ICD-10-CM

## 2013-12-12 HISTORY — DX: Other seborrheic keratosis: L82.1

## 2013-12-12 LAB — CBC
HEMATOCRIT: 39 % (ref 36.0–46.0)
HEMOGLOBIN: 13.5 g/dL (ref 12.0–15.0)
MCH: 30.9 pg (ref 26.0–34.0)
MCHC: 34.6 g/dL (ref 30.0–36.0)
MCV: 89.2 fL (ref 78.0–100.0)
Platelets: 222 10*3/uL (ref 150–400)
RBC: 4.37 MIL/uL (ref 3.87–5.11)
RDW: 13.5 % (ref 11.5–15.5)
WBC: 5.9 10*3/uL (ref 4.0–10.5)

## 2013-12-12 LAB — POCT INR: INR: 2.9

## 2013-12-12 NOTE — Patient Instructions (Signed)
[name], you received you FLU shot today.  We are checking your Hemoglobin today.  If it continues to be good, I will recommend we stop the daily iron tablet.   Consider getting some "Covermark" cosmetic foundation to cover the bruising on your hands and arms.  It is available in most department stores that carry cosmetics.   If the "wart" on your leg becomes irritated, apply antibiotic ointment twice a day until the inflammation improves. By freezing the "wart" we are irritating the tissue so your body's defense mechanisms will attack the abnormal tissue and remove it.

## 2013-12-12 NOTE — Progress Notes (Signed)
   Subjective:    Patient ID: [name], female    DOB: 1923/10/12, 80 y.o.   MRN: [id number]  HPI Problem List Items Addressed This Visit     High   AF (paroxysmal atrial fibrillation) (Chronic) - longstanding problem - no chest pain, no limb or facial numbness or weakness. No change in speech.  - On warfarin 17 mg weekly.  No bleeding. Minimal bruising on hand dorsums and foreamrs.  No melena.  - No limitation in walking.  No DOE. No or;thopnea.       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  - taking daily iron - rEcent hemoglobin and ferritin WNL. - No light headedness.  Occasional stomach upset with iron table.    Relevant Orders      CBC     Unprioritized   Seborrheic keratosis, right anterior thigh - Present for over a year. - patient using salicylate patch daily without regression in keratosis of thigh - No bleeding or causing pain or rubbing against clothing.     Loss of weight - New problem - Onset since removal of teeth.  Has a non-healing area on upper gums that has prevented fitting for dentures.   - Eating softer foods, still eating well in her opinion.  - No diarrhea.  No fever/chills/night sweats.      Other Visit Diagnoses   Encounter for immunization    -  Primary      Smoking status reviewed.    Review of Systems See HPI (+) weight loss (-) fever/chills     Objective:   Physical Exam VS reviewed GEN: Alert, Cooperative, Groomed, NAD            COR: RRR, No M/G/R, No JVD, Normal PMI size and location LUNGS: BCTA, No Acc mm use, speaking in full sentences EXT: No peripheral leg edema.  SKIN: No lesion nor rashes of face/trunk/extremities Neuro: Oriented to person, place, and time;  Gait: Normal speed, No significant path deviation, Step through +,  Psych: Normal affect/thought/speech/language    Assessment & Plan:

## 2013-12-12 NOTE — Assessment & Plan Note (Signed)
Stable. Check CBC  Will stop FeSO4 daily tablet Recheck in CBC in one month when patient returns for INR check.

## 2013-12-12 NOTE — Assessment & Plan Note (Signed)
1 cm diameter S. Keratosis right anterior thigh.   Will attempt irritant therapy with cryotherapy x 3 freeze thaw cycle today. Repeat on next office visit.

## 2013-12-12 NOTE — Assessment & Plan Note (Signed)
Adequate blood pressure control.  No evidence of new end organ damage.  Tolerating medication without significant adverse effects.  Plan to continue current blood pressure regiment.   

## 2013-12-12 NOTE — Assessment & Plan Note (Signed)
Stable. In regular rhythm.  DDD pacemaker in place INR 2.9 on 17 mg warfarin daily.  Continue currently anticoagulation therapy dose and schedule.  Return to [facility] lab in  one month for recheck.

## 2013-12-12 NOTE — Assessment & Plan Note (Signed)
Wt Readings from Last 3 Encounters:  12/12/13 161 lb (73.029 kg)  10/17/13 170 lb (77.111 kg)  09/13/13 165 lb (74.844 kg)   Working explanation of weight loss is decreased caloric intake secondary to recent dental extractions with non-healing gum wound and no fitted dentures.  No other concerning findings at this time.  Will monitor of office visits.

## 2013-12-16 ENCOUNTER — Encounter: Payer: Self-pay | Admitting: Family Medicine

## 2013-12-26 ENCOUNTER — Ambulatory Visit: Payer: Medicare Other

## 2014-01-07 DIAGNOSIS — H3532 Exudative age-related macular degeneration: Secondary | ICD-10-CM | POA: Diagnosis not present

## 2014-01-09 ENCOUNTER — Ambulatory Visit (INDEPENDENT_AMBULATORY_CARE_PROVIDER_SITE_OTHER): Payer: Medicare Other | Admitting: *Deleted

## 2014-01-09 ENCOUNTER — Encounter: Payer: Self-pay | Admitting: Family Medicine

## 2014-01-09 ENCOUNTER — Ambulatory Visit (INDEPENDENT_AMBULATORY_CARE_PROVIDER_SITE_OTHER): Payer: Medicare Other | Admitting: Family Medicine

## 2014-01-09 VITALS — BP 115/74 | HR 109 | Temp 98.4°F | Ht 64.5 in | Wt 162.3 lb

## 2014-01-09 DIAGNOSIS — I480 Paroxysmal atrial fibrillation: Secondary | ICD-10-CM

## 2014-01-09 DIAGNOSIS — M25529 Pain in unspecified elbow: Secondary | ICD-10-CM | POA: Insufficient documentation

## 2014-01-09 DIAGNOSIS — M79622 Pain in left upper arm: Secondary | ICD-10-CM | POA: Diagnosis not present

## 2014-01-09 DIAGNOSIS — M25512 Pain in left shoulder: Secondary | ICD-10-CM | POA: Diagnosis not present

## 2014-01-09 DIAGNOSIS — M25522 Pain in left elbow: Secondary | ICD-10-CM

## 2014-01-09 DIAGNOSIS — D509 Iron deficiency anemia, unspecified: Secondary | ICD-10-CM

## 2014-01-09 DIAGNOSIS — Z7901 Long term (current) use of anticoagulants: Secondary | ICD-10-CM | POA: Diagnosis not present

## 2014-01-09 HISTORY — DX: Pain in left shoulder: M25.512

## 2014-01-09 LAB — POCT INR: INR: 2.9

## 2014-01-09 LAB — CBC
HCT: 39.3 % (ref 36.0–46.0)
Hemoglobin: 13 g/dL (ref 12.0–15.0)
MCH: 30.3 pg (ref 26.0–34.0)
MCHC: 33.1 g/dL (ref 30.0–36.0)
MCV: 91.6 fL (ref 78.0–100.0)
PLATELETS: 198 10*3/uL (ref 150–400)
RBC: 4.29 MIL/uL (ref 3.87–5.11)
RDW: 13.4 % (ref 11.5–15.5)
WBC: 6.2 10*3/uL (ref 4.0–10.5)

## 2014-01-09 LAB — FERRITIN: FERRITIN: 236 ng/mL (ref 10–291)

## 2014-01-09 NOTE — Patient Instructions (Signed)
If your left shoulder worsens, let Dr Hal Norrington know.  He will set up an appointment with the Sports Medicine Doctor, Dr Valinda Hoar.

## 2014-01-09 NOTE — Assessment & Plan Note (Addendum)
Stable In regular rhythm Adequate anticoagulation with INR 2.9 on 17 mg Warfarin a week. RTC for INR 4 weeks.

## 2014-01-09 NOTE — Assessment & Plan Note (Signed)
Old problem Checking CBC today to see if Hgb remains stable off of dailyiron supplement;

## 2014-01-09 NOTE — Progress Notes (Signed)
Patient ID: [name], female   DOB: 27-Dec-1923, 80 y.o.   MRN: [id number]   Subjective:    Patient ID: [name], female    DOB: 1923-06-06, 80 y.o.   MRN: [id number]  HPI Problem List Items Addressed This Visit     High   AF (paroxysmal atrial fibrillation) (Chronic) - longstanding problem - no chest pain, no limb or facial numbness or weakness. No change in speech.  - On warfarin 17 mg weekly.  No bleeding. Minimal bruising on hand dorsums and foreamrs.  No melena.  - No limitation in walking.  No DOE. No orthopnea.       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  - taking daily iron - rcent hemoglobin and ferritin WNL. - Stopped iron supplement last month - No light headedness.  Occasional stomach upset with iron table.    Relevant Orders      CBC     Unprioritized   Seborrheic keratosis, right anterior thigh - Present for over a year. - patient using salicylate patch daily without regression in keratosis of thigh - No bleeding or causing pain or rubbing against clothing.     Loss of weight - New problem - Onset since removal of teeth. Wound on upper gums healed - Eating softer foods, still eating well in her opinion.  - No diarrhea.  No fever/chills/night sweats.    Left upper arm pain Onset: uncertain exact onset, within last several weeks Location: proximal left upper arm, right around area of old lipoma Quality: aching Severity: mild to moderate Function: pain with abducting arm above 90 degree Pattern: daily, intermittent Course: stable since onset Radiation: none Relief: none Precipitant: none recalled Associated Symptoms: no weakness in Trauma (Acute or Chronic): no Prior Diagnostic Testing or Treatments: none    Other Visit Diagnoses   Encounter for immunization    -  Primary      Smoking status reviewed.    Review of Systems See HPI (+) weight loss (-) fever/chills       Objective:   Physical Exam VS reviewed GEN: Alert, Cooperative, Groomed, NAD            COR: RRR, No M/G/R, No JVD, Normal PMI size and location LUNGS: BCTA, No Acc mm use, speaking in full sentences EXT: No peripheral leg edema Left Shoulder: Inspection reveals 3 cm x 3 cm mobile, soft, SQ fullness, non-tender. No overlying skin changes of erythema.  Palpation is normal except tenderness to anterior proximal upper arm.  Unable to abduct arm above 90 degree secondary to pain. No limitation in abduction right shoulder above 90 degrees.  No signs of impingement with negative Neer and Hawkin's tests, empty can. No labral pathology noted with negative Obrien's, negative clunk and good stability.    Assessment & Plan:

## 2014-01-09 NOTE — Assessment & Plan Note (Signed)
New problem While suspect a shoulder bursitis, the localization of pain to around the presumed lipoma makes a broader differential possible. Will get Xray of left shoulder and upper arm to look for bony process.  If pain persists, especially if it localizes more to the area below the presumed lipoma, will need to consider biopsy of presumed lipoma. If localizes to subacromium or shows impingement signs, then try trial of shoulder corticosteroid injection.

## 2014-01-10 ENCOUNTER — Ambulatory Visit
Admission: RE | Admit: 2014-01-10 | Discharge: 2014-01-10 | Disposition: A | Discharge status: Home / Self Care | Payer: Medicare Other | Source: Ambulatory Visit | Attending: Family Medicine | Admitting: Family Medicine

## 2014-01-10 ENCOUNTER — Telehealth: Payer: Self-pay | Admitting: *Deleted

## 2014-01-10 ENCOUNTER — Encounter: Payer: Self-pay | Admitting: Family Medicine

## 2014-01-10 DIAGNOSIS — M25522 Pain in left elbow: Secondary | ICD-10-CM

## 2014-01-10 DIAGNOSIS — M25512 Pain in left shoulder: Secondary | ICD-10-CM

## 2014-01-10 NOTE — Telephone Encounter (Signed)
Pt wants to know if Dr. McDiarmid will complete a form for her to have two more handicap placards, hers have expired. [name], [name]

## 2014-01-10 NOTE — Telephone Encounter (Signed)
Application for [place] [employer]  Disability Parking Placard signed and mailed to patient's home address.

## 2014-01-13 ENCOUNTER — Telehealth: Payer: Self-pay | Admitting: Family Medicine

## 2014-01-13 ENCOUNTER — Telehealth: Payer: Self-pay | Admitting: *Deleted

## 2014-01-13 NOTE — Telephone Encounter (Signed)
Appt made for patient tomorrow. Jazmin Hartsell,CMA

## 2014-01-13 NOTE — Telephone Encounter (Signed)
Ms Travers still with pain in left arm/shoulder. It is not improving with time. She has had to take tramadol. Left shoulder Xray was read as normal except an expanded subcutaneous fat in upper arm lump.  Radiologist favored lipoma.  Patient to come in tomorrow for a subacromial corticosteroid injection with lidocaine to see if this improves the pain.  If it does not, then will recommend CT of arm as there is some slight cortical abnormality on Xray underlying the expanded subcutaneous fat which is the area of greatest discomfort.

## 2014-01-13 NOTE — Telephone Encounter (Signed)
-----   Message from [place], MD sent at 01/13/2014  3:32 PM EST ----- Regarding: Schedule Walk-in appointment to see McDiarmid Please schedule [name] for a Work-in appointment for 10:00 am to see McDiarmid tomorrow, 01/14/14.  Patient already knows to come into [facility] on 11/17 for 10:00 am appointment.  Have hall nurse page McDiarmid to see patient when she arrives in exam room.Marland Kitchen

## 2014-01-14 ENCOUNTER — Encounter: Payer: Self-pay | Admitting: Family Medicine

## 2014-01-14 ENCOUNTER — Ambulatory Visit (INDEPENDENT_AMBULATORY_CARE_PROVIDER_SITE_OTHER): Payer: Medicare Other | Admitting: Family Medicine

## 2014-01-14 ENCOUNTER — Ambulatory Visit: Payer: Medicare Other | Admitting: Family Medicine

## 2014-01-14 VITALS — BP 107/69 | HR 109 | Temp 97.5°F

## 2014-01-14 DIAGNOSIS — M25512 Pain in left shoulder: Secondary | ICD-10-CM | POA: Diagnosis not present

## 2014-01-14 MED ORDER — PREDNISONE 10 MG PO TABS: 30.0000 mg | ORAL_TABLET | Freq: Every day | ORAL | Status: DC

## 2014-01-14 NOTE — Patient Instructions (Signed)
Start taking Prednisone 3 tablet daily to help with inflammation for 5 days.  We made an appointment with Dr Maureen Ralphs to see about the left shoulder pain.

## 2014-01-14 NOTE — Progress Notes (Signed)
   Subjective:    Patient ID: [name], female    DOB: 1923/10/01, 80 y.o.   MRN: [id number]  HPI  Left Shoulder Pain Onset: 1 to 2 weeks ago  Location: anterolateral left shoulder / proximal upper arm Quality: aching with brief sharp pains with movements Severity: interfering with sleep and use of left arm Duration: 1 to 2 weeks Pattern: constant Course: persistent Radiation: none Relief: tramadol 2 tab Precipitant: none recalled  Associated Symptoms: no weakness or numbness in left arm.  No neck pain. No pain in other joints or limbs Trauma (Acute or Chronic): none Prior Diagnostic Testing or Treatments:   DG Shoulder Left (01/10/14) IMPRESSION: 1. No acute osseous findings. 2. Expanded subcutaneous fat in the region of upper arm lump, favoring lipoma.  No smoking   Review of Systems  No fever No anorexia No night sweats      Objective:   Physical Exam Shoulder: Inspection reveals 3 cm x 3cm slightly elevated soft tissue mass located proximal anterolateral upper arm.  No overlying erythema. Point tenderness just proximal to upper pole of lipoma Palpation with no tenderness over AC joint or bicipital groove. AROM pain-limited for extension, flexion, internal and external rotation, abduction and adduction.  Negative drop test No signs of impingement with negative Hawkin's tests, empty can. Yergason's tests normal.  Procedure Local IM injection 3 ml Lidocaine 1% in region of maximal tenderness at upper pole region of lipoma and second point of tenderness 2 cm medial to first site after sterile preps.  Post procedure: no improvement in pain with AROM.           Assessment & Plan:

## 2014-01-14 NOTE — Assessment & Plan Note (Signed)
Ongoing problem Xray left shoulder was unrevealing. Pain does not localize to lipoma Local lidocaine deep tissue injections at maximal points of tenderness did not improve pain with ROM left shoulder.  Negative Impingement signs.  This may still be a shoulder bursitis.  Will try trial of prednisone 30 mg daily for 5 days ( patient with solitary kidney and CKD) to see if this will help with pain along with tramadol for pain as needed analgesia.  Referral to orthopedics for evaluation and treatment (Dr Allusion's office who did her knee replacement)

## 2014-01-16 DIAGNOSIS — H3532 Exudative age-related macular degeneration: Secondary | ICD-10-CM | POA: Diagnosis not present

## 2014-01-17 DIAGNOSIS — M7542 Impingement syndrome of left shoulder: Secondary | ICD-10-CM | POA: Diagnosis not present

## 2014-01-17 DIAGNOSIS — D1722 Benign lipomatous neoplasm of skin and subcutaneous tissue of left arm: Secondary | ICD-10-CM | POA: Diagnosis not present

## 2014-01-17 DIAGNOSIS — M25512 Pain in left shoulder: Secondary | ICD-10-CM | POA: Diagnosis not present

## 2014-01-19 ENCOUNTER — Other Ambulatory Visit: Payer: Self-pay | Admitting: Family Medicine

## 2014-01-20 NOTE — Telephone Encounter (Signed)
Pt called and needs refills furosemide and Warfarin called in. jw

## 2014-01-22 ENCOUNTER — Encounter: Payer: Self-pay | Admitting: *Deleted

## 2014-01-23 ENCOUNTER — Other Ambulatory Visit: Payer: Self-pay | Admitting: Family Medicine

## 2014-01-24 ENCOUNTER — Telehealth: Payer: Self-pay | Admitting: Family Medicine

## 2014-01-24 NOTE — Telephone Encounter (Signed)
I spoke with [name] by phone. She sounds much more cohernent.  Her speech is clear and language is goal directed.  I suspect that she had an adverse response to baclofen which she was taking thinking it was for her heart. She reports that the Baclofen has been removed from her pill box.  This was confirmed by her Ezekiel Ina.  [name] would like to come into [facility] to see Dr Valentina Lucks for a "Maximino [place]" check up of all the medications she is taking and how she is taking them.  I will ask the blue hall nursing staff to set up this appointment as soon as Dr Valentina Lucks has an open appointment slot.

## 2014-01-27 NOTE — Telephone Encounter (Signed)
Appt scheduled for tomorrow with Dr. Valentina Lucks and patient is aware of this. Jarold Macomber,CMA

## 2014-01-28 ENCOUNTER — Encounter: Payer: Self-pay | Admitting: Pharmacist

## 2014-01-28 ENCOUNTER — Ambulatory Visit (INDEPENDENT_AMBULATORY_CARE_PROVIDER_SITE_OTHER): Payer: Medicare Other | Admitting: Pharmacist

## 2014-01-28 VITALS — BP 110/57 | HR 67 | Ht 63.25 in | Wt 157.0 lb

## 2014-01-28 DIAGNOSIS — Z79899 Other long term (current) drug therapy: Secondary | ICD-10-CM

## 2014-01-28 NOTE — Patient Instructions (Signed)
It was nice to see you today!  Stop the baclofen. Stop the cyclobenzaprine (Flexeril)  Keep taking all of your other medications as you have been.  Follow up with Dr. Wendy Poet

## 2014-01-28 NOTE — Progress Notes (Signed)
S: Patient arrives to clinic in good spirits with all of her medications. She presents for medication reconciliation per Dr. Wendy Poet.  She has with her both current medications and ones which have been discontinued.   Last week, patient reports taking 3 baclofen 10 mg and 1 cyclobenzaprine 10 mg at once. She reports that she didn't want to take the medications throughout the day and thought it would be helpful to take it all at one. After this, she became confused and her family reported this to Dr. Wendy Poet.   Reports feeling confident about taking her medications and managing her own health. She has a few questions about her medications - what they are for and which albuterol inhaler she should take. She prefers the Ventolin as it has a counter on it but she was told at her pharmacy [facility]) that she may prefer the Proventil.   O: BP Readings from Last 1 Encounters:  01/28/14 110/57    A/P: Medication reconciliation: all medications reviewed with patient, included what they are for and how to take them. Patient wrote down the information to keep with her. Instructed her to not take the baclofen until she saw Dr. Wendy Poet. Recommended that patient take Ventolin as needed if that is the albuterol inhaler she prefers.  Patient verbalized understanding.   Written patient instructions provided.  Follow up in with Dr. McDiarmid next. Total time in face to face counseling 30 minutes.  Patient seen with Nicoletta Ba, PharmD Resident.

## 2014-01-28 NOTE — Assessment & Plan Note (Signed)
Medication reconciliation: all medications reviewed with patient, included what they are for and how to take them. Patient wrote down the information to keep with her. Instructed her to not take the baclofen until she saw Dr. Wendy Poet. Recommended that patient take Ventolin as needed if that is the albuterol inhaler she prefers.  Patient verbalized understanding.

## 2014-01-28 NOTE — Progress Notes (Signed)
Patient ID: [name], female   DOB: [birth year], 80 y.o.   MRN: [id number] Reviewed: Agree with Dr. Graylin Shiver documentation and management.

## 2014-02-06 ENCOUNTER — Encounter (HOSPITAL_COMMUNITY): Payer: Self-pay | Admitting: Internal Medicine

## 2014-02-06 ENCOUNTER — Ambulatory Visit (INDEPENDENT_AMBULATORY_CARE_PROVIDER_SITE_OTHER): Payer: Medicare Other | Admitting: *Deleted

## 2014-02-06 ENCOUNTER — Ambulatory Visit: Payer: Medicare Other

## 2014-02-06 DIAGNOSIS — I480 Paroxysmal atrial fibrillation: Secondary | ICD-10-CM | POA: Diagnosis not present

## 2014-02-06 LAB — POCT INR: INR: 3

## 2014-02-08 ENCOUNTER — Other Ambulatory Visit: Payer: Self-pay | Admitting: Family Medicine

## 2014-02-10 ENCOUNTER — Telehealth: Payer: Self-pay | Admitting: *Deleted

## 2014-02-10 MED ORDER — TRAMADOL HCL 50 MG PO TABS: 50.0000 mg | ORAL_TABLET | Freq: Four times a day (QID) | ORAL | Status: DC | PRN

## 2014-02-10 NOTE — Telephone Encounter (Signed)
Received a fax from [facility] stating pt is requesting a Rx for Tramadol.  They don't have an old prescription on file for pt.  Medication is not listed on current med list.  Derl Barrow, RN

## 2014-02-12 ENCOUNTER — Other Ambulatory Visit: Payer: Self-pay | Admitting: *Deleted

## 2014-02-14 MED ORDER — ALBUTEROL SULFATE HFA 108 (90 BASE) MCG/ACT IN AERS: 2.0000 | INHALATION_SPRAY | Freq: Four times a day (QID) | RESPIRATORY_TRACT | Status: DC | PRN

## 2014-02-24 ENCOUNTER — Other Ambulatory Visit: Payer: Self-pay | Admitting: *Deleted

## 2014-02-24 MED ORDER — WARFARIN SODIUM 1 MG PO TABS: ORAL_TABLET | ORAL | Status: DC

## 2014-02-25 ENCOUNTER — Encounter: Payer: Self-pay | Admitting: *Deleted

## 2014-03-03 DIAGNOSIS — D1722 Benign lipomatous neoplasm of skin and subcutaneous tissue of left arm: Secondary | ICD-10-CM | POA: Diagnosis not present

## 2014-03-03 DIAGNOSIS — M720 Palmar fascial fibromatosis [Dupuytren]: Secondary | ICD-10-CM | POA: Diagnosis not present

## 2014-03-03 DIAGNOSIS — M65341 Trigger finger, right ring finger: Secondary | ICD-10-CM | POA: Diagnosis not present

## 2014-03-04 ENCOUNTER — Telehealth: Payer: Self-pay | Admitting: Internal Medicine

## 2014-03-04 NOTE — Telephone Encounter (Signed)
New message      Pt got a letter regarding having her pacemaker remotely checked.  She has never remotely checked it.  Please call tomorrow.

## 2014-03-05 NOTE — Telephone Encounter (Signed)
Spoke w/ pt and she informed me that she has a home monitor but she is not sure where it is because she "put it up". I informed pt that she is due for her 1 year follow up appt w/ MD in February 2016 that we can schedule her an appt on 04-08-14 at 12:00 PM. Pt agreed to this appt. Pt stated that if she finds home monitor before then she will bring it with her to appt so someone can show her how to use it.

## 2014-03-06 ENCOUNTER — Ambulatory Visit: Payer: Medicare Other

## 2014-03-06 DIAGNOSIS — H3532 Exudative age-related macular degeneration: Secondary | ICD-10-CM | POA: Diagnosis not present

## 2014-03-07 ENCOUNTER — Telehealth: Payer: Self-pay | Admitting: Internal Medicine

## 2014-03-07 NOTE — Telephone Encounter (Signed)
Request for surgical clearance:  1. What type of surgery is being performed? Hand surgery, Growth in her hand with a trigger finger in a lot of pain   2. When is this surgery scheduled? Not scheduled until OV  3. Are there any medications that need to be held prior to surgery and how long? Please hold Coumadin   4. Name of physician performing surgery? Dr. Amedeo Plenty     What is your office phone and fax number? Office # [phone] doesn't have the number

## 2014-03-07 NOTE — Telephone Encounter (Signed)
lmom for patient that Dr Lovena Le will address on Tues

## 2014-03-12 NOTE — Telephone Encounter (Signed)
Discussed with Dr Lovena Le.  Okay to proceed with surgery.  Low risk

## 2014-03-13 ENCOUNTER — Telehealth: Payer: Self-pay | Admitting: Internal Medicine

## 2014-03-13 DIAGNOSIS — H3532 Exudative age-related macular degeneration: Secondary | ICD-10-CM | POA: Diagnosis not present

## 2014-03-13 NOTE — Telephone Encounter (Signed)
Received request from Nurse fax box, documents faxed for surgical clearance. To: [employer] Fax number: [phone] Attention: [phone]/km

## 2014-03-26 DIAGNOSIS — D509 Iron deficiency anemia, unspecified: Secondary | ICD-10-CM | POA: Diagnosis not present

## 2014-03-26 DIAGNOSIS — Z79899 Other long term (current) drug therapy: Secondary | ICD-10-CM | POA: Diagnosis not present

## 2014-03-27 ENCOUNTER — Other Ambulatory Visit: Payer: Self-pay | Admitting: Family Medicine

## 2014-03-27 ENCOUNTER — Ambulatory Visit: Payer: Medicare Other | Admitting: Family Medicine

## 2014-03-28 ENCOUNTER — Other Ambulatory Visit: Payer: Self-pay | Admitting: Family Medicine

## 2014-04-01 DIAGNOSIS — Z6827 Body mass index (BMI) 27.0-27.9, adult: Secondary | ICD-10-CM | POA: Diagnosis not present

## 2014-04-01 DIAGNOSIS — J438 Other emphysema: Secondary | ICD-10-CM | POA: Diagnosis not present

## 2014-04-08 ENCOUNTER — Ambulatory Visit (INDEPENDENT_AMBULATORY_CARE_PROVIDER_SITE_OTHER): Payer: Medicare Other | Admitting: Internal Medicine

## 2014-04-08 ENCOUNTER — Encounter: Payer: Self-pay | Admitting: Internal Medicine

## 2014-04-08 VITALS — BP 122/68 | HR 77 | Ht 63.25 in | Wt 161.4 lb

## 2014-04-08 DIAGNOSIS — Z01810 Encounter for preprocedural cardiovascular examination: Secondary | ICD-10-CM | POA: Diagnosis not present

## 2014-04-08 DIAGNOSIS — I495 Sick sinus syndrome: Secondary | ICD-10-CM

## 2014-04-08 DIAGNOSIS — Z0000 Encounter for general adult medical examination without abnormal findings: Secondary | ICD-10-CM | POA: Insufficient documentation

## 2014-04-08 DIAGNOSIS — I480 Paroxysmal atrial fibrillation: Secondary | ICD-10-CM | POA: Diagnosis not present

## 2014-04-08 DIAGNOSIS — I10 Essential (primary) hypertension: Secondary | ICD-10-CM | POA: Diagnosis not present

## 2014-04-08 DIAGNOSIS — Z950 Presence of cardiac pacemaker: Secondary | ICD-10-CM

## 2014-04-08 LAB — MDC_IDC_ENUM_SESS_TYPE_INCLINIC
Battery Impedance: 129 Ohm
Battery Voltage: 2.8 V
Brady Statistic AP VP Percent: 0 %
Brady Statistic AS VP Percent: 0 %
Lead Channel Impedance Value: 586 Ohm
Lead Channel Impedance Value: 945 Ohm
Lead Channel Sensing Intrinsic Amplitude: 31.36 mV
Lead Channel Sensing Intrinsic Amplitude: 4 mV
Lead Channel Setting Pacing Amplitude: 1.5 V
MDC IDC MSMT BATTERY REMAINING LONGEVITY: 139 mo
MDC IDC MSMT LEADCHNL RA PACING THRESHOLD AMPLITUDE: 0.5 V
MDC IDC MSMT LEADCHNL RA PACING THRESHOLD PULSEWIDTH: 0.4 ms
MDC IDC MSMT LEADCHNL RV PACING THRESHOLD AMPLITUDE: 0.5 V
MDC IDC MSMT LEADCHNL RV PACING THRESHOLD PULSEWIDTH: 0.4 ms
MDC IDC SESS DTM: 20160209123125
MDC IDC SET LEADCHNL RV PACING AMPLITUDE: 2 V
MDC IDC SET LEADCHNL RV PACING PULSEWIDTH: 0.4 ms
MDC IDC SET LEADCHNL RV SENSING SENSITIVITY: 5.6 mV
MDC IDC STAT BRADY AP VS PERCENT: 78 %
MDC IDC STAT BRADY AS VS PERCENT: 22 %

## 2014-04-08 NOTE — Assessment & Plan Note (Signed)
Her Medtronic dual-chamber pacemaker is working normally. We'll plan to recheck in several months. 

## 2014-04-08 NOTE — Patient Instructions (Signed)
Your physician recommends that you continue on your current medications as directed. Please refer to the Current Medication list given to you today. Your physician recommends that you return for lab work in: TODAY (INR) Remote monitoring is used to monitor your Pacemaker of ICD from home. This monitoring reduces the number of office visits required to check your device to one time per year. It allows [place] to keep an eye on the functioning of your device to ensure it is working properly. You are scheduled for a device check from home on 07/08/14. You may send your transmission at any time that day. If you have a wireless device, the transmission will be sent automatically. After your physician reviews your transmission, you will receive a postcard with your next transmission date.  Your physician wants you to follow-up in: [place].  You will receive a reminder letter in the mail two months in advance. If you don't receive a letter, please call our office to schedule the follow-up appointment.

## 2014-04-08 NOTE — Assessment & Plan Note (Signed)
Patient is maintaining sinus rhythm approximately 99% of the time. She will continue her current medical therapy.

## 2014-04-08 NOTE — Assessment & Plan Note (Signed)
Her blood pressure is well controlled. She is encouraged to continue her medical therapy, and maintain a low-sodium diet.

## 2014-04-08 NOTE — Progress Notes (Signed)
HPI [name] returns today for followup. She is a pleasant 81 yo woman with a h/o symptomatic tachy-brady syndrome, PAF, s/p PPM insertion. She denies chest pain or sob. No syncope. Minimal peripheral edema. The patient is pending hand surgery. Allergies  Allergen Reactions  . Baclofen Other (See Comments)    Confusion occurred with taking 3 at the same time.  . Codeine Phosphate Nausea Only  . Hydrochlorothiazide Other (See Comments)    Possible acute gouty arthritis of wrist  . Montelukast Sodium Other (See Comments)    Unspecified reaction.      Current Outpatient Prescriptions  Medication Sig Dispense Refill  . ADVAIR DISKUS 250-50 MCG/DOSE AEPB 2 puffs every 6 (six) hours as needed.    Marland Kitchen albuterol (PROVENTIL) (2.5 MG/3ML) 0.083% nebulizer solution Take 3 mLs (2.5 mg total) by nebulization every 6 (six) hours as needed for wheezing or shortness of breath. 75 mL 4  . albuterol (VENTOLIN HFA) 108 (90 BASE) MCG/ACT inhaler Inhale 2 puffs into the lungs every 6 (six) hours as needed. If needed for shortness of breath. 1 Inhaler 3  . azithromycin (ZITHROMAX) 250 MG tablet TAKE 2 TABLETS BY MOUTH AT ONCE TODAY, THEN TAKE 1 TABLET BY MOUTH ONCE DAILY FOR 4 DAYS 6 tablet 2  . cholecalciferol (VITAMIN D) 1000 UNITS tablet Take 1,000 Units by mouth daily.    . furosemide (LASIX) 40 MG tablet Take 40 mg by mouth daily as needed for fluid.    Marland Kitchen LORazepam (ATIVAN) 0.5 MG tablet TAKE 1 TABLET BY MOUTH EVERY 8 HOURS AS NEEDED FOR ANXIETY 30 tablet 1  . metoprolol succinate (TOPROL-XL) 50 MG 24 hr tablet TAKE 1 TABLET BY MOUTH EVERY DAY WITH FOOD 90 tablet 3  . NITROSTAT 0.4 MG SL tablet PLACE ONE TABLET UNDER THE TONGUE EVERY FIVE MINUTES AS NEEDED. 25 tablet 3  . omeprazole (PRILOSEC) 40 MG capsule TAKE 1 CAPSULE EVERY DAY. 90 capsule 2  . predniSONE (DELTASONE) 10 MG tablet Take 3 tablets (30 mg total) by mouth daily with breakfast. 15 tablet 1  . simvastatin (ZOCOR) 40 MG tablet TAKE 1  TABLET BY MOUTH EVERY MORNING 90 tablet 3  . tiotropium (SPIRIVA) 18 MCG inhalation capsule Place 1 capsule (18 mcg total) into inhaler and inhale daily. 30 capsule PRN  . traMADol (ULTRAM) 50 MG tablet Take 1 tablet (50 mg total) by mouth every 6 (six) hours as needed for moderate pain. 30 tablet 1  . vitamin B-12 (CYANOCOBALAMIN) 1000 MCG tablet Take 1,000 mcg by mouth daily.     Marland Kitchen warfarin (COUMADIN) 1 MG tablet TAKE TABLETS BY MOUTH AS DIRECTED BY PHYSICIAN. 270 tablet 0   No current facility-administered medications for this visit.     Past Medical History  Diagnosis Date  . Candidiasis of the esophagus 11/26/2007  . Myocardial infarct, old   . COPD, severe   . Sick sinus syndrome with tachycardia   . Pacemaker   . Chronic kidney disease (CKD), stage III (moderate)   . Hypertension   . Acute appendicitis with rupture   . Spinal stenosis, lumbar   . Adrenal adenoma     Incidentaloma  . Spondylolisthesis of lumbar region   . Lumbar herniated disc     History of HNP L4/5 in 2003  . Hx of colonoscopy with polypectomy 04/27/2010    Dr Cristina Gong found three  tubular adenomas each less than 10 mm size  . Retinal hemorrhage of left eye 06/2010  .  Cataract 2013    Bilateral   . Abnormal mammogram, unspecified 08/23/2010    Followup imaging reassuring.  Repeat in 6 months.   Marland Kitchen DISC WITH RADICULOPATHY 04/27/2006    Qualifier: Diagnosis of  By: McDiarmid MD, Sherren Mocha    . Transitional cell carcinoma of ureter, history   . Mild cognitive impairment 10/26/2012    (10/25/12) Failed MiniCog screen  . 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 Normal uric acid serum level at time of attack.    Marland Kitchen COPD (chronic obstructive pulmonary disease)   . EDEMA-LEGS,DUE TO VENOUS OBSTRUCT. 04/27/2006    Qualifier: Diagnosis of  By: McDiarmid MD, Sherren Mocha    . ANXIETY 04/27/2006    Qualifier: Diagnosis of  By: McDiarmid MD, Sherren Mocha    .  HERNIA, HIATAL, NONCONGENITAL 04/27/2006    Qualifier: Diagnosis of  By: McDiarmid MD, Sherren Mocha    . History of Hemorrhoids 04/27/2006    Qualifier: Diagnosis of  By: McDiarmid MD, Sherren Mocha    . RHINITIS, ALLERGIC 04/27/2006    Qualifier: Diagnosis of  By: McDiarmid MD, Sherren Mocha    . SCHATZKI'S RING, HX OF 11/26/2007    Qualifier: Diagnosis of  By: McDiarmid MD, Sherren Mocha  An EGD was performed by Dr Cristina Gong on 04/27/2010 for iron deficiency anemia. There was a a transient hiatal hernia with Schatzki's ring. Stomach and duodenum were normal. EGD on 03/06/12 by Dr Cristina Gong for IDA non-obstructing Schatzki's ring at Gastroesophageal junction, otherwise normal esophagus and stomach.    . Urge incontinence 12/13/2011    Diagnosed in 10/2011 by Dr Bjorn Loser (Urology)   . VITAMIN B12 DEFICIENCY 10/07/2009    Qualifier: Diagnosis of  By: McDiarmid MD, Sherren Mocha  Dx based on a post-TKR anemia work-up Low normal serum B12 with high Methylmalonic acid and homocysteine level  Vit B12 serum level (10/28/10) > 1500 pg/mL   . Vitamin D deficiency 11/02/2010    Serum vitamin D 25(OH) = 10.9 ng/mL (30 -100) on 10/28/10 c/w Vitamin D deficiency.     . AF (paroxysmal atrial fibrillation) 11/11/2010    Hospitalization (9/8-9/10, Dr Daneen Schick, III, Cardiology) for Paroxysmal Atrial Fibrillation with RVR and anginal pain secondary to demand/supply mismatch in setting of RVR with known circumflex artery branch disease.    Marland Kitchen HYPERTENSION, BENIGN ESSENTIAL 06/18/2007    Qualifier: Diagnosis of  By: McDiarmid MD, Sherren Mocha    . Iron deficiency anemia 08/05/2010    Dr Cristina Gong (GI) has evaluated with EGD, colonoscopy, and video capsular endoscopy in 2011 & 2012.  All have been unrevealing as to an origin of IDA.  OV with Dr Cristina Gong (10/28/10) assessment of blood in stool per hemoccult and GER. Hbg 12.1 g/dL, MCV 91.8, Ferritin 30 ng/mL. Patient taking on ferrous sulfate tab daily.   EGD on 03/06/12 by Dr Cristina Gong for IDA non-obstructing Schatzki's ring at  Gastroesophageal junction, otherwise normal esophagus and stomach.     . Macular degeneration, bilateral 10/04/2010    Right eye is wet MD, the other is dry macular degeneration (ARMD). Pt undergoing some form of vascular endothelial growth factor inhibition intraocular therapy.    . VENTRICULAR HYPERTROPHY, LEFT 08/28/2008    Qualifier: Diagnosis of  By: McDiarmid MD, Sherren Mocha    . CHRONIC KIDNEY DISEASE STAGE III (MODERATE) 09/02/2009    Patient with Solitary Kidney S/P total Nephrectomy for transitional cell caner.    Marland Kitchen COPD 04/27/2006  Qualifier: Diagnosis of  By: McDiarmid MD, Sherren Mocha    . MYOCARDIAL INFARCTION, HX OF 09/25/2008    Qualifier: Diagnosis of  By: McDiarmid MD, Todd  Hospitalized for NSTEMI ( 10/02/2008). PCI (10/02/2008, Dr Daneen Schick) with RCA [place] placed: 99% lesion to 0% lesion. Circumflex Obtuse marginal with 90% stenosis (left for medical mangement for now)   Hospitalization (9/8-9/10, Dr Daneen Schick, III, Cardiology) for Paroxysmal Atrial Fibrillation with RVR and anginal pain secondary to demand/supply mismatch in setting of RVR with known circumflex artery branch disease.    Marland Kitchen SICK SINUS SYNDROME 04/27/2006    Qualifier: Diagnosis of  By: McDiarmid MD, Sherren Mocha  S/P dual chamber pacemaker placement by Dr Osie Cheeks ([facility]) with pacemaker lead extraction & reinsertion - 07/25/2003  Hospitalization (9/8-9/10, Dr Daneen Schick, III, Cardiology) for Paroxysmal Atrial Fibrillation with RVR and anginal pain secondary to demand/supply mismatch in setting of RVR with known circumflex artery branch disease.    . Solitary kidney, acquired 05/17/2010    Surgical removal for transitional cell cancer by Tresa Endo, MD ([facility]). Surveillance cystoscopy by Dr Alinda [facility]) on 10/19/12 without evidence of cystoscopic recurrence. Recommend RTC one year for cystoscopy.   . ADENOMATOUS COLONIC POLYP 03/01/2003    Qualifier: Diagnosis of  By: McDiarmid MD, Sherren Mocha Multiple benign polyps of  cecum, ascending, transverse and sigmoid colon by 8/09 colonoscopy by Dr Cristina Gong  Colonoscopy by Dr Cristina Gong for iron-deficiency anemia on 04/27/2010 showed three sessile polyps that were in ascending (3 mm x 9 mm), transverse (4 mm), and cecum (3 mm).  All three were tubular adenomas that were negative for high grade dysplasia or malignancy on pathology. Dr Cristina Gong called the polpys benign and not requiring follow-up in view of the patients age.    . Soft tissue injury of foot 05/03/2011  . PREDIABETES 09/11/2007    Qualifier: Diagnosis of  By: McDiarmid MD, Sherren Mocha    . Numbness and tingling in hands 07/21/2011  . MUSCLE CRAMPS 03/11/2010    Qualifier: Diagnosis of  By: McDiarmid MD, Sherren Mocha    . Mammogram abnormal 02/15/2011  . LUMBAR SPINAL STENOSIS 04/27/2006    Qualifier: Diagnosis of  By: McDiarmid MD, Francisca December HNP w/ L4&5 root encroachment - 05/24/2001,   Spinal Stenosis: L-S MRI: L4-5 Spinal stenosis, - 03/03/2006, Spondylolithesis L5-S1(mild), DJD L4-5 on X-Ray 11/04   . Leg cramps 05/29/2012  . 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 Normal uric acid serum level at time of attack.    . Cellulitis of leg, right 10/01/2012  . Solar lentigo 06/15/2012  . Muscle spasm of back 09/24/2013  . 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  No mass lesion seen. Likely cause of patients 2 to 3 episodes of bleeding from patient's right eye.      ROS:   All systems reviewed and negative except as noted in the HPI.   Past Surgical History  Procedure Laterality Date  . Pacemaker insertion      Dr Lovena Le ([facility])  . Nephrectomy  For transition cell cancer     Dr Tresa Endo, surgeon  . Replacement total knee   2009, Right knee    Dr Wynelle Link  . Replacement total  knee  05/2009, Left knee    Dr Wynelle Link  . Knee arthroscopy w/ synovectomy  11/2009, left knee    Dr Wynelle Link  . Cholecystectomy    . Esophagogastroduodenoscopy  04/27/2010    Dr Cristina Gong - found transient H/H & Schatzki's ring  . Esophagogastroduodenoscopy endoscopy  03/06/2012    Dr Cristina Gong - found non-obstuctive Schatzki's ring at [employer] [facility]. o/w normal EGD.   Marland Kitchen Pacemaker generator change N/A 11/12/2012    Procedure: PACEMAKER GENERATOR CHANGE;  Surgeon: Evans Lance, MD;  Location: [facility] CATH LAB;  Service: Cardiovascular;  Laterality: N/A;     History reviewed. No pertinent family history.   History   Social History  . Marital Status: Widowed    Spouse Name: N/A    Number of Children: 4  . Years of Education: N/A   Occupational History  . Futures trader     Retired   Social History Main Topics  . Smoking status: Former Smoker    Types: Cigarettes  . Smokeless tobacco: Never Used  . Alcohol Use: 3.0 oz/week    6 Not specified per week     Comment: 4-5 glasses of wine per week  . Drug Use: No  . Sexual Activity: No   Other Topics Concern  . 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    Semi-retired Futures trader.     Pt has home nebulizer for inhalation therapies.   Former Smoker   Smoking Status:  quit > 5 years ago      (+) DNR status per discussion with Dr McDiarmid 10/25/12 and reiterated 06/20/13 office visit .   (+) Living Will/Advance Directive   (+) HC-POA: Cecile Sheerer Causey (pt's dgt)     BP 122/68 mmHg  Pulse 77  Ht 5' 3.25" (1.607 m)  Wt 161 lb 6.4 oz (73.211 kg)  BMI 28.35 kg/m2  Physical Exam:  Well appearing elderly woman, NAD HEENT: Unremarkable Neck:  6 cm JVD, no thyromegally Back:  No CVA tenderness Lungs:   Clear with no wheezes HEART:  Regular rate rhythm, no murmurs, no rubs, no clicks Abd:  soft, positive bowel sounds, no organomegally, no rebound, no guarding Ext:  2 plus pulses, no edema, no cyanosis, no clubbing Skin:  No rashes no nodules Neuro:  CN II through XII intact, motor grossly intact   DEVICE  Normal device function.  See PaceArt for details.   Assess/Plan:

## 2014-04-08 NOTE — Assessment & Plan Note (Signed)
The patient would be a low surgical risk for cardiovascular complications. She may proceed with hand surgery.

## 2014-04-10 ENCOUNTER — Ambulatory Visit (INDEPENDENT_AMBULATORY_CARE_PROVIDER_SITE_OTHER): Payer: Medicare Other | Admitting: Family Medicine

## 2014-04-10 DIAGNOSIS — I480 Paroxysmal atrial fibrillation: Secondary | ICD-10-CM | POA: Diagnosis not present

## 2014-04-10 LAB — POCT INR: INR: 2.6

## 2014-04-22 ENCOUNTER — Other Ambulatory Visit: Payer: Self-pay | Admitting: Orthopedic Surgery

## 2014-04-29 DIAGNOSIS — D1722 Benign lipomatous neoplasm of skin and subcutaneous tissue of left arm: Secondary | ICD-10-CM | POA: Diagnosis not present

## 2014-04-29 DIAGNOSIS — M7542 Impingement syndrome of left shoulder: Secondary | ICD-10-CM | POA: Diagnosis not present

## 2014-05-01 ENCOUNTER — Emergency Department (HOSPITAL_COMMUNITY)
Admission: EM | Admit: 2014-05-01 | Discharge: 2014-05-01 | Disposition: A | Discharge status: Home / Self Care | Payer: Medicare Other | Attending: Emergency Medicine | Admitting: Emergency Medicine

## 2014-05-01 ENCOUNTER — Ambulatory Visit: Payer: Medicare Other | Admitting: Family Medicine

## 2014-05-01 ENCOUNTER — Emergency Department (HOSPITAL_COMMUNITY): Payer: Medicare Other

## 2014-05-01 ENCOUNTER — Encounter (HOSPITAL_COMMUNITY): Payer: Self-pay | Admitting: Emergency Medicine

## 2014-05-01 DIAGNOSIS — J441 Chronic obstructive pulmonary disease with (acute) exacerbation: Secondary | ICD-10-CM | POA: Insufficient documentation

## 2014-05-01 DIAGNOSIS — M109 Gout, unspecified: Secondary | ICD-10-CM | POA: Diagnosis not present

## 2014-05-01 DIAGNOSIS — Z87891 Personal history of nicotine dependence: Secondary | ICD-10-CM | POA: Insufficient documentation

## 2014-05-01 DIAGNOSIS — R410 Disorientation, unspecified: Secondary | ICD-10-CM | POA: Diagnosis not present

## 2014-05-01 DIAGNOSIS — Z86018 Personal history of other benign neoplasm: Secondary | ICD-10-CM | POA: Insufficient documentation

## 2014-05-01 DIAGNOSIS — R079 Chest pain, unspecified: Secondary | ICD-10-CM | POA: Insufficient documentation

## 2014-05-01 DIAGNOSIS — Z872 Personal history of diseases of the skin and subcutaneous tissue: Secondary | ICD-10-CM | POA: Insufficient documentation

## 2014-05-01 DIAGNOSIS — Z862 Personal history of diseases of the blood and blood-forming organs and certain disorders involving the immune mechanism: Secondary | ICD-10-CM | POA: Insufficient documentation

## 2014-05-01 DIAGNOSIS — I252 Old myocardial infarction: Secondary | ICD-10-CM | POA: Insufficient documentation

## 2014-05-01 DIAGNOSIS — I480 Paroxysmal atrial fibrillation: Secondary | ICD-10-CM | POA: Insufficient documentation

## 2014-05-01 DIAGNOSIS — I129 Hypertensive chronic kidney disease with stage 1 through stage 4 chronic kidney disease, or unspecified chronic kidney disease: Secondary | ICD-10-CM | POA: Diagnosis not present

## 2014-05-01 DIAGNOSIS — Z8619 Personal history of other infectious and parasitic diseases: Secondary | ICD-10-CM | POA: Diagnosis not present

## 2014-05-01 DIAGNOSIS — Z8719 Personal history of other diseases of the digestive system: Secondary | ICD-10-CM | POA: Diagnosis not present

## 2014-05-01 DIAGNOSIS — R072 Precordial pain: Secondary | ICD-10-CM | POA: Diagnosis not present

## 2014-05-01 DIAGNOSIS — Z792 Long term (current) use of antibiotics: Secondary | ICD-10-CM | POA: Diagnosis not present

## 2014-05-01 DIAGNOSIS — Z950 Presence of cardiac pacemaker: Secondary | ICD-10-CM | POA: Insufficient documentation

## 2014-05-01 DIAGNOSIS — H269 Unspecified cataract: Secondary | ICD-10-CM | POA: Diagnosis not present

## 2014-05-01 DIAGNOSIS — Z79899 Other long term (current) drug therapy: Secondary | ICD-10-CM | POA: Diagnosis not present

## 2014-05-01 DIAGNOSIS — Z7901 Long term (current) use of anticoagulants: Secondary | ICD-10-CM | POA: Insufficient documentation

## 2014-05-01 DIAGNOSIS — Z7952 Long term (current) use of systemic steroids: Secondary | ICD-10-CM | POA: Diagnosis not present

## 2014-05-01 DIAGNOSIS — Z87828 Personal history of other (healed) physical injury and trauma: Secondary | ICD-10-CM | POA: Diagnosis not present

## 2014-05-01 DIAGNOSIS — N183 Chronic kidney disease, stage 3 (moderate): Secondary | ICD-10-CM | POA: Diagnosis not present

## 2014-05-01 DIAGNOSIS — F419 Anxiety disorder, unspecified: Secondary | ICD-10-CM | POA: Insufficient documentation

## 2014-05-01 DIAGNOSIS — J449 Chronic obstructive pulmonary disease, unspecified: Secondary | ICD-10-CM | POA: Diagnosis not present

## 2014-05-01 DIAGNOSIS — R0789 Other chest pain: Secondary | ICD-10-CM | POA: Diagnosis not present

## 2014-05-01 DIAGNOSIS — Z8554 Personal history of malignant neoplasm of ureter: Secondary | ICD-10-CM | POA: Diagnosis not present

## 2014-05-01 DIAGNOSIS — E559 Vitamin D deficiency, unspecified: Secondary | ICD-10-CM | POA: Diagnosis not present

## 2014-05-01 LAB — URINALYSIS, ROUTINE W REFLEX MICROSCOPIC
Bilirubin Urine: NEGATIVE
Glucose, UA: NEGATIVE mg/dL
Hgb urine dipstick: NEGATIVE
Ketones, ur: NEGATIVE mg/dL
NITRITE: NEGATIVE
PH: 5 (ref 5.0–8.0)
Protein, ur: NEGATIVE mg/dL
Specific Gravity, Urine: 1.017 (ref 1.005–1.030)
Urobilinogen, UA: 0.2 mg/dL (ref 0.0–1.0)

## 2014-05-01 LAB — CBC WITH DIFFERENTIAL/PLATELET
BASOS ABS: 0 10*3/uL (ref 0.0–0.1)
Basophils Relative: 0 % (ref 0–1)
EOS PCT: 1 % (ref 0–5)
Eosinophils Absolute: 0.1 10*3/uL (ref 0.0–0.7)
HCT: 35.6 % — ABNORMAL LOW (ref 36.0–46.0)
Hemoglobin: 11.8 g/dL — ABNORMAL LOW (ref 12.0–15.0)
LYMPHS ABS: 1.5 10*3/uL (ref 0.7–4.0)
LYMPHS PCT: 20 % (ref 12–46)
MCH: 31.1 pg (ref 26.0–34.0)
MCHC: 33.1 g/dL (ref 30.0–36.0)
MCV: 93.7 fL (ref 78.0–100.0)
Monocytes Absolute: 0.9 10*3/uL (ref 0.1–1.0)
Monocytes Relative: 11 % (ref 3–12)
NEUTROS PCT: 68 % (ref 43–77)
Neutro Abs: 5.2 10*3/uL (ref 1.7–7.7)
PLATELETS: 235 10*3/uL (ref 150–400)
RBC: 3.8 MIL/uL — ABNORMAL LOW (ref 3.87–5.11)
RDW: 12.5 % (ref 11.5–15.5)
WBC: 7.6 10*3/uL (ref 4.0–10.5)

## 2014-05-01 LAB — COMPREHENSIVE METABOLIC PANEL
ALBUMIN: 3.5 g/dL (ref 3.5–5.2)
ALK PHOS: 47 U/L (ref 39–117)
ALT: 13 U/L (ref 0–35)
AST: 24 U/L (ref 0–37)
Anion gap: 10 (ref 5–15)
BUN: 36 mg/dL — ABNORMAL HIGH (ref 6–23)
CO2: 25 mmol/L (ref 19–32)
Calcium: 8.9 mg/dL (ref 8.4–10.5)
Chloride: 103 mmol/L (ref 96–112)
Creatinine, Ser: 1.32 mg/dL — ABNORMAL HIGH (ref 0.50–1.10)
GFR calc Af Amer: 40 mL/min — ABNORMAL LOW (ref >90)
GFR calc non Af Amer: 35 mL/min — ABNORMAL LOW (ref >90)
GLUCOSE: 123 mg/dL — AB (ref 70–99)
POTASSIUM: 4.2 mmol/L (ref 3.5–5.1)
SODIUM: 138 mmol/L (ref 135–145)
Total Bilirubin: 0.4 mg/dL (ref 0.3–1.2)
Total Protein: 5.6 g/dL — ABNORMAL LOW (ref 6.0–8.3)

## 2014-05-01 LAB — PROTIME-INR
INR: 3.63 — ABNORMAL HIGH (ref 0.00–1.49)
PROTHROMBIN TIME: 36.4 s — AB (ref 11.6–15.2)

## 2014-05-01 LAB — URINE MICROSCOPIC-ADD ON

## 2014-05-01 LAB — I-STAT TROPONIN, ED: Troponin i, poc: 0.02 ng/mL (ref 0.00–0.08)

## 2014-05-01 LAB — TROPONIN I: Troponin I: <0.03 ng/mL (ref <0.031)

## 2014-05-01 NOTE — Discharge Instructions (Signed)
Follow up with your family md as planned

## 2014-05-01 NOTE — ED Provider Notes (Signed)
CSN: [id number]     Arrival date & time 05/01/14  0201 History  This chart was scribed for Tammy Rice, MD by Rayfield Citizen, ED Scribe. This patient was seen in room A04C/A04C and the patient's care was started at 2:08 AM.    Chief Complaint  Patient presents with  . Chest Pain  . Palpitations   The history is provided by the patient and the EMS personnel. No language interpreter was used.     HPI Comments: Tammy KNIEP is a 81 y.o. female with past medical history of old MI, sick sinus syndrome with tachycardia, pacemaker in right chest, HTN, a-fib who presents to the Emergency Department complaining of palpitations with central chest pressure, radiating to her left arm, beginning last night around 22:00 and resolving around 02:00. Patient reports that she was "gasping" for air at symptom onset. She explains that she felt her heart "racing" and she adjusted ("flipped over") her pacemaker; she believes there is an issue with its placement, as it "moves around" and "doesn't have anything to hold on to." Patient took 2 NTG, 324 aspirin and 1mg  ativan at home. When EMS arrived, patient was found to be in a-fib, heart rate varying between 80-160bpm, but was feeling "improved." She is feeling "much better" at this time. Denies any pain at present.  She was seen yesterday regarding her shoulder and was given a medication containing steroids. Cardiologist is Dr. Lovena Le, who managed her second pacemaker placement.   Past Medical History  Diagnosis Date  . Candidiasis of the esophagus 11/26/2007  . Myocardial infarct, old   . COPD, severe   . Sick sinus syndrome with tachycardia   . Pacemaker   . Chronic kidney disease (CKD), stage III (moderate)   . Hypertension   . Acute appendicitis with rupture   . Spinal stenosis, lumbar   . Adrenal adenoma     Incidentaloma  . Spondylolisthesis of lumbar region   . Lumbar herniated disc     History of HNP L4/5 in 2003  . Hx of colonoscopy with polypectomy  04/27/2010    Dr Cristina Gong found three  tubular adenomas each less than 10 mm size  . Retinal hemorrhage of left eye 06/2010  . Cataract 2013    Bilateral   . Abnormal mammogram, unspecified 08/23/2010    Followup imaging reassuring.  Repeat in 6 months.   Marland Kitchen DISC WITH RADICULOPATHY 04/27/2006    Qualifier: Diagnosis of  By: McDiarmid MD, Sherren Mocha    . Transitional cell carcinoma of ureter, history   . Mild cognitive impairment 10/26/2012    (10/25/12) Failed MiniCog screen  . 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 Normal uric acid serum level at time of attack.    Marland Kitchen COPD (chronic obstructive pulmonary disease)   . EDEMA-LEGS,DUE TO VENOUS OBSTRUCT. 04/27/2006    Qualifier: Diagnosis of  By: McDiarmid MD, Sherren Mocha    . ANXIETY 04/27/2006    Qualifier: Diagnosis of  By: McDiarmid MD, Sherren Mocha    . HERNIA, HIATAL, NONCONGENITAL 04/27/2006    Qualifier: Diagnosis of  By: McDiarmid MD, Sherren Mocha    . History of Hemorrhoids 04/27/2006    Qualifier: Diagnosis of  By: McDiarmid MD, Sherren Mocha    . RHINITIS, ALLERGIC 04/27/2006    Qualifier: Diagnosis of  By: McDiarmid MD, Sherren Mocha    . SCHATZKI'S RING, HX OF 11/26/2007    Qualifier: Diagnosis of  By: McDiarmid MD, Sherren Mocha  An EGD was performed by Dr Cristina Gong on 04/27/2010 for iron deficiency anemia. There was a a transient hiatal hernia with Schatzki's ring. Stomach and duodenum were normal. EGD on 03/06/12 by Dr Cristina Gong for IDA non-obstructing Schatzki's ring at Gastroesophageal junction, otherwise normal esophagus and stomach.    . Urge incontinence 12/13/2011    Diagnosed in 10/2011 by Dr Bjorn Loser (Urology)   . VITAMIN B12 DEFICIENCY 10/07/2009    Qualifier: Diagnosis of  By: McDiarmid MD, Sherren Mocha  Dx based on a post-TKR anemia work-up Low normal serum B12 with high Methylmalonic acid and homocysteine level  Vit B12 serum level (10/28/10) > 1500 pg/mL   . Vitamin D deficiency 11/02/2010    Serum vitamin D  25(OH) = 10.9 ng/mL (30 -100) on 10/28/10 c/w Vitamin D deficiency.     . AF (paroxysmal atrial fibrillation) 11/11/2010    Hospitalization (9/8-9/10, Dr Daneen Schick, III, Cardiology) for Paroxysmal Atrial Fibrillation with RVR and anginal pain secondary to demand/supply mismatch in setting of RVR with known circumflex artery branch disease.    Marland Kitchen HYPERTENSION, BENIGN ESSENTIAL 06/18/2007    Qualifier: Diagnosis of  By: McDiarmid MD, Sherren Mocha    . Iron deficiency anemia 08/05/2010    Dr Cristina Gong (GI) has evaluated with EGD, colonoscopy, and video capsular endoscopy in 2011 & 2012.  All have been unrevealing as to an origin of IDA.  OV with Dr Cristina Gong (10/28/10) assessment of blood in stool per hemoccult and GER. Hbg 12.1 g/dL, MCV 91.8, Ferritin 30 ng/mL. Patient taking on ferrous sulfate tab daily.   EGD on 03/06/12 by Dr Cristina Gong for IDA non-obstructing Schatzki's ring at Gastroesophageal junction, otherwise normal esophagus and stomach.     . Macular degeneration, bilateral 10/04/2010    Right eye is wet MD, the other is dry macular degeneration (ARMD). Pt undergoing some form of vascular endothelial growth factor inhibition intraocular therapy.    . VENTRICULAR HYPERTROPHY, LEFT 08/28/2008    Qualifier: Diagnosis of  By: McDiarmid MD, Sherren Mocha    . CHRONIC KIDNEY DISEASE STAGE III (MODERATE) 09/02/2009    Patient with Solitary Kidney S/P total Nephrectomy for transitional cell caner.    Marland Kitchen COPD 04/27/2006    Qualifier: Diagnosis of  By: McDiarmid MD, Sherren Mocha    . MYOCARDIAL INFARCTION, HX OF 09/25/2008    Qualifier: Diagnosis of  By: McDiarmid MD, Todd  Hospitalized for NSTEMI ( 10/02/2008). PCI (10/02/2008, Dr Daneen Schick) with RCA [place] placed: 99% lesion to 0% lesion. Circumflex Obtuse marginal with 90% stenosis (left for medical mangement for now)   Hospitalization (9/8-9/10, Dr Daneen Schick, III, Cardiology) for Paroxysmal Atrial Fibrillation with RVR and anginal pain secondary to demand/supply mismatch in setting  of RVR with known circumflex artery branch disease.    Marland Kitchen SICK SINUS SYNDROME 04/27/2006    Qualifier: Diagnosis of  By: McDiarmid MD, Sherren Mocha  S/P dual chamber pacemaker placement by Dr Osie Cheeks ([facility]) with pacemaker lead extraction & reinsertion - 07/25/2003  Hospitalization (9/8-9/10, Dr Daneen Schick, III, Cardiology) for Paroxysmal Atrial Fibrillation with RVR and anginal pain secondary to demand/supply mismatch in setting of RVR with known circumflex artery branch disease.    . Solitary kidney, acquired 05/17/2010    Surgical removal for transitional cell cancer by Tresa Endo, MD ([facility]). Surveillance cystoscopy by Dr Alinda Money [facility]) on 10/19/12 without evidence of cystoscopic recurrence. Recommend RTC one year for cystoscopy.   . ADENOMATOUS COLONIC POLYP 03/01/2003    Qualifier: Diagnosis  of  By: McDiarmid MD, Sherren Mocha Multiple benign polyps of cecum, ascending, transverse and sigmoid colon by 8/09 colonoscopy by Dr Cristina Gong  Colonoscopy by Dr Cristina Gong for iron-deficiency anemia on 04/27/2010 showed three sessile polyps that were in ascending (3 mm x 9 mm), transverse (4 mm), and cecum (3 mm).  All three were tubular adenomas that were negative for high grade dysplasia or malignancy on pathology. Dr Cristina Gong called the polpys benign and not requiring follow-up in view of the patients age.    . Soft tissue injury of foot 05/03/2011  . PREDIABETES 09/11/2007    Qualifier: Diagnosis of  By: McDiarmid MD, Sherren Mocha    . Numbness and tingling in hands 07/21/2011  . MUSCLE CRAMPS 03/11/2010    Qualifier: Diagnosis of  By: McDiarmid MD, Sherren Mocha    . Mammogram abnormal 02/15/2011  . LUMBAR SPINAL STENOSIS 04/27/2006    Qualifier: Diagnosis of  By: McDiarmid MD, Francisca December HNP w/ L4&5 root encroachment - 05/24/2001,   Spinal Stenosis: L-S MRI: L4-5 Spinal stenosis, - 03/03/2006, Spondylolithesis L5-S1(mild), DJD L4-5 on X-Ray 11/04   . Leg cramps 05/29/2012  . 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 Normal uric acid serum level at time of attack.    . Cellulitis of leg, right 10/01/2012  . Solar lentigo 06/15/2012  . Muscle spasm of back 09/24/2013  . 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  No mass lesion seen. Likely cause of patients 2 to 3 episodes of bleeding from patient's right eye.     Past Surgical History  Procedure Laterality Date  . Pacemaker insertion      Dr Lovena Le ([facility])  . Nephrectomy  For transition cell cancer     Dr Tresa Endo, surgeon  . Replacement total knee  2009, Right knee    Dr Wynelle Link  . Replacement total knee  05/2009, Left knee    Dr Wynelle Link  . Knee arthroscopy w/ synovectomy  11/2009, left knee    Dr Wynelle Link  . Cholecystectomy    . Esophagogastroduodenoscopy  04/27/2010    Dr Cristina Gong - found transient H/H & Schatzki's ring  . Esophagogastroduodenoscopy endoscopy  03/06/2012    Dr Cristina Gong - found non-obstuctive Schatzki's ring at [employer] [facility]. o/w normal EGD.   Marland Kitchen Pacemaker generator change N/A 11/12/2012    Procedure: PACEMAKER GENERATOR CHANGE;  Surgeon: Evans Lance, MD;  Location: [facility] CATH LAB;  Service: Cardiovascular;  Laterality: N/A;   History reviewed. No pertinent family history. History  Substance Use Topics  . Smoking status: Former Smoker    Types: Cigarettes  . Smokeless tobacco: Never Used  . Alcohol Use: 3.0 oz/week    6 Standard drinks or equivalent per week     Comment: 4-5 glasses of wine per week   OB History    No data available     Review of Systems  Constitutional: Negative for fever and chills.  Respiratory: Positive for chest tightness and shortness of breath.   Cardiovascular: Positive for palpitations. Negative for leg swelling.   Gastrointestinal: Negative for nausea, vomiting and abdominal pain.  Skin: Negative for rash.  Neurological: Negative for dizziness, weakness, light-headedness, numbness and headaches.  All other systems reviewed and are negative.  Allergies  Baclofen; Codeine phosphate; Hydrochlorothiazide; and Montelukast sodium  Home Medications   Prior to Admission medications   Medication Sig Start Date End Date Taking? Authorizing Provider  ADVAIR DISKUS 250-50 MCG/DOSE AEPB Inhale 2 puffs into the lungs 2 (two) times daily.  03/14/13  Yes Historical Provider, MD  albuterol (PROVENTIL) (2.5 MG/3ML) 0.083% nebulizer solution Take 3 mLs (2.5 mg total) by nebulization every 6 (six) hours as needed for wheezing or shortness of breath. 08/09/13  Yes Willeen Niece, MD  albuterol (VENTOLIN HFA) 108 (90 BASE) MCG/ACT inhaler Inhale 2 puffs into the lungs every 6 (six) hours as needed. If needed for shortness of breath. 02/14/14  Yes Blane Ohara McDiarmid, MD  B Complex Vitamins (VITAMIN B COMPLEX PO) Take 1 tablet by mouth daily.   Yes Historical Provider, MD  cholecalciferol (VITAMIN D) 1000 UNITS tablet Take 1,000 Units by mouth daily.   Yes Historical Provider, MD  furosemide (LASIX) 40 MG tablet Take 40 mg by mouth daily as needed for fluid.   Yes Historical Provider, MD  LORazepam (ATIVAN) 0.5 MG tablet TAKE 1 TABLET BY MOUTH EVERY 8 HOURS AS NEEDED FOR ANXIETY 02/10/14  Yes Todd D McDiarmid, MD  metoprolol succinate (TOPROL-XL) 50 MG 24 hr tablet TAKE 1 TABLET BY MOUTH EVERY DAY WITH FOOD   Yes Blane Ohara McDiarmid, MD  NITROSTAT 0.4 MG SL tablet PLACE ONE TABLET UNDER THE TONGUE EVERY FIVE MINUTES AS NEEDED. Patient taking differently: PLACE ONE TABLET UNDER THE TONGUE EVERY FIVE MINUTES AS NEEDED FOR CHEST PAIN.   Yes Lind Covert, MD  omeprazole (PRILOSEC) 40 MG capsule TAKE 1 CAPSULE EVERY DAY. 11/25/13  Yes Lind Covert, MD  predniSONE (DELTASONE) 10 MG tablet Take 3 tablets (30 mg total) by  mouth daily with breakfast. 01/14/14  Yes Blane Ohara McDiarmid, MD  simvastatin (ZOCOR) 40 MG tablet TAKE 1 TABLET BY MOUTH EVERY MORNING 11/18/13  Yes Blane Ohara McDiarmid, MD  tiotropium (SPIRIVA) 18 MCG inhalation capsule Place 1 capsule (18 mcg total) into inhaler and inhale daily. 09/25/13  Yes Blane Ohara McDiarmid, MD  vitamin B-12 (CYANOCOBALAMIN) 1000 MCG tablet Take 1,000 mcg by mouth daily.    Yes Historical Provider, MD  warfarin (COUMADIN) 1 MG tablet TAKE TABLETS BY MOUTH AS DIRECTED BY PHYSICIAN. Patient taking differently: TAKE 3MG  ON MONDAY, WEDNESDAY AND FRIDAYS.  TAKE 2MG  ALL OTHER DAYS OF THE WEEK. 03/31/14  Yes Blane Ohara McDiarmid, MD  azithromycin (ZITHROMAX) 250 MG tablet TAKE 2 TABLETS BY MOUTH AT ONCE TODAY, THEN TAKE 1 TABLET BY MOUTH ONCE DAILY FOR 4 DAYS Patient not taking: Reported on 05/01/2014 12/02/13   Blane Ohara McDiarmid, MD  traMADol (ULTRAM) 50 MG tablet Take 1 tablet (50 mg total) by mouth every 6 (six) hours as needed for moderate pain. Patient not taking: Reported on 05/01/2014 02/10/14   Blane Ohara McDiarmid, MD   BP 112/59 mmHg  Pulse 78  Resp 22  SpO2 100% Physical Exam  Constitutional: She is oriented to person, place, and time. She appears well-developed and well-nourished. No distress.  HENT:  Head: Normocephalic and atraumatic.  Mouth/Throat: Oropharynx is clear and moist.  Eyes: EOM are normal. Pupils are equal, round, and reactive to light.  Neck: Normal range of motion. Neck supple.  Cardiovascular: Normal rate and regular rhythm.  Exam reveals no gallop and no friction rub.   No murmur heard. Pulmonary/Chest: Effort normal and breath sounds normal. No respiratory distress. She has no wheezes. She has  no rales. She exhibits no tenderness.  Abdominal: Soft. Bowel sounds are normal. She exhibits no distension and no mass. There is no tenderness. There is no rebound and no guarding.  Musculoskeletal: Normal range of motion. She exhibits no edema or tenderness.  No lower  extremity swelling or pain.  Neurological: She is alert and oriented to person, place, and time.  Skin: Skin is warm and dry. No rash noted. No erythema.  Psychiatric: She has a normal mood and affect. Her behavior is normal.  Nursing note and vitals reviewed.   ED Course  Procedures   DIAGNOSTIC STUDIES: Oxygen Saturation is 99% on RA, normal by my interpretation.    COORDINATION OF CARE: 2:16 AM Discussed treatment plan with pt at bedside and pt agreed to plan.   Labs Review Labs Reviewed  CBC WITH DIFFERENTIAL/PLATELET - Abnormal; Notable for the following:    RBC 3.80 (*)    Hemoglobin 11.8 (*)    HCT 35.6 (*)    All other components within normal limits  COMPREHENSIVE METABOLIC PANEL - Abnormal; Notable for the following:    Glucose, Bld 123 (*)    BUN 36 (*)    Creatinine, Ser 1.32 (*)    Total Protein 5.6 (*)    GFR calc non Af Amer 35 (*)    GFR calc Af Amer 40 (*)    All other components within normal limits  PROTIME-INR - Abnormal; Notable for the following:    Prothrombin Time 36.4 (*)    INR 3.63 (*)    All other components within normal limits  TROPONIN I  URINALYSIS, ROUTINE W REFLEX MICROSCOPIC  I-STAT TROPOININ, ED    Imaging Review Dg Chest 2 View  05/01/2014   CLINICAL DATA:  Acute onset of mid chest pain and shortness of breath. Initial encounter.  EXAM: CHEST  2 VIEW  COMPARISON:  Chest radiograph performed 03/13/2013  FINDINGS: The lungs are hyperexpanded, with flattening of hemidiaphragms, compatible with COPD. Left basilar airspace opacity is similar in appearance to the prior study and likely reflects scarring. No definite focal airspace consolidation is seen on the lateral view. There is no evidence of pleural effusion or pneumothorax.  The heart is borderline enlarged. A pacemaker is noted at the right chest wall, with leads ending overlying the right atrium and right ventricle. No acute osseous abnormalities are seen.  IMPRESSION: Findings of COPD.  Stable appearance to left basilar scarring, without definite airspace consolidation. Borderline cardiomegaly.   Electronically Signed   By: Garald Balding M.D.   On: 05/01/2014 03:07     EKG Interpretation   Date/Time:  Thursday May 01 2014 02:07:12 EST Ventricular Rate:  61 PR Interval:  168 QRS Duration: 123 QT Interval:  421 QTC Calculation: 424 R Axis:   -20 Text Interpretation:  Sinus or ectopic atrial rhythm Left bundle branch  block Confirmed by Daishawn Lauf  MD, Calob Baskette ([id number]) on 05/01/2014 4:21:29 AM      MDM   Final diagnoses:  Chest pain    I personally performed the services described in this documentation, which was scribed in my presence. The recorded information has been reviewed and is accurate.  A she is very well-appearing. At this point she is asymptomatic. EKG shows normal sinus rhythm with T-wave inversions in lateral leads that appears similar to previous EKG..   Patient remains asymptomatic. Repeat EKG with new T-wave inversions in inferior leads. Discussed with Dr. Burt Knack. Cardiology will evaluate in the emergency department and disposition.  Tammy Rice, MD 05/07/14 [phone]

## 2014-05-01 NOTE — Consult Note (Signed)
CARDIOLOGY CONSULT NOTE  Patient ID: [name], MRN: [id number], DOB/AGE: [birth year] 81 y.o. Admit date: 05/01/2014 Date of Consult: 05/01/2014  Primary Physician: Acquanetta Sit, MD Primary Cardiologist: Crissie Sickles, MD Referring Physician: Dr Lita Mains  Chief Complaint: Chest Pain Reason for Consultation: Chest pain   HPI:  [name] is a 81yo woman with PMHx of atrial fibrillation on coumadin, MI in 2010, pacemaker (last interrogated 04/08/14-normal), CKD Stage 3, and COPD who presented to the ED with chest pain. Patient reports she felt a "flutter" last night around 11 PM after walking back from the bathroom. She then noted chest pain with deep inspiration that she describes as substernal, "burning" in sensation, radiating to her left arm, and associated with dyspnea and palpitations. Patient was having a difficult time quantifying the pain. She thought her pain was related to anxiety so she asked her daughter to bring her medications and she took 2 Ativan (0.5 mg each), which mildly helped the pain. EMS was called. She was given nitro by EMS but is unsure if that helped the pain. She states she no longer has the pain. On retrospect, she noted that her pacemaker had made a "funny noise, almost like a hissing" and that it had "flipped over" while she was laying on her left side. She denies any nausea, vomiting, abdominal pain, or diaphoresis. She denies any recent chest pain on exertion. She follows with Dr. Lovena Le.   Troponin negative x 1 in the ED. INR supratherapeutic at 3.63.   Medical History:  Past Medical History  Diagnosis Date  . Candidiasis of the esophagus 11/26/2007  . Myocardial infarct, old   . COPD, severe   . Sick sinus syndrome with tachycardia   . Pacemaker   . Chronic kidney disease (CKD), stage III (moderate)   . Hypertension   . Acute appendicitis with rupture   . Spinal stenosis, lumbar   . Adrenal adenoma     Incidentaloma  . Spondylolisthesis of lumbar  region   . Lumbar herniated disc     History of HNP L4/5 in 2003  . Hx of colonoscopy with polypectomy 04/27/2010    Dr Cristina Gong found three  tubular adenomas each less than 10 mm size  . Retinal hemorrhage of left eye 06/2010  . Cataract 2013    Bilateral   . Abnormal mammogram, unspecified 08/23/2010    Followup imaging reassuring.  Repeat in 6 months.   Marland Kitchen DISC WITH RADICULOPATHY 04/27/2006    Qualifier: Diagnosis of  By: McDiarmid MD, Sherren Mocha    . Transitional cell carcinoma of ureter, history   . Mild cognitive impairment 10/26/2012    (10/25/12) Failed MiniCog screen  . 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 Normal uric acid serum level at time of attack.    Marland Kitchen COPD (chronic obstructive pulmonary disease)   . EDEMA-LEGS,DUE TO VENOUS OBSTRUCT. 04/27/2006    Qualifier: Diagnosis of  By: McDiarmid MD, Sherren Mocha    . ANXIETY 04/27/2006    Qualifier: Diagnosis of  By: McDiarmid MD, Sherren Mocha    . HERNIA, HIATAL, NONCONGENITAL 04/27/2006    Qualifier: Diagnosis of  By: McDiarmid MD, Sherren Mocha    . History of Hemorrhoids 04/27/2006    Qualifier: Diagnosis of  By: McDiarmid MD, Sherren Mocha    . RHINITIS, ALLERGIC 04/27/2006    Qualifier: Diagnosis of  By: McDiarmid MD, Sherren Mocha    . SCHATZKI'S RING,  HX OF 11/26/2007    Qualifier: Diagnosis of  By: McDiarmid MD, Sherren Mocha  An EGD was performed by Dr Cristina Gong on 04/27/2010 for iron deficiency anemia. There was a a transient hiatal hernia with Schatzki's ring. Stomach and duodenum were normal. EGD on 03/06/12 by Dr Cristina Gong for IDA non-obstructing Schatzki's ring at Gastroesophageal junction, otherwise normal esophagus and stomach.    . Urge incontinence 12/13/2011    Diagnosed in 10/2011 by Dr Bjorn Loser (Urology)   . VITAMIN B12 DEFICIENCY 10/07/2009    Qualifier: Diagnosis of  By: McDiarmid MD, Sherren Mocha  Dx based on a post-TKR anemia work-up Low normal serum B12 with high Methylmalonic acid and homocysteine  level  Vit B12 serum level (10/28/10) > 1500 pg/mL   . Vitamin D deficiency 11/02/2010    Serum vitamin D 25(OH) = 10.9 ng/mL (30 -100) on 10/28/10 c/w Vitamin D deficiency.     . AF (paroxysmal atrial fibrillation) 11/11/2010    Hospitalization (9/8-9/10, Dr Daneen Schick, III, Cardiology) for Paroxysmal Atrial Fibrillation with RVR and anginal pain secondary to demand/supply mismatch in setting of RVR with known circumflex artery branch disease.    Marland Kitchen HYPERTENSION, BENIGN ESSENTIAL 06/18/2007    Qualifier: Diagnosis of  By: McDiarmid MD, Sherren Mocha    . Iron deficiency anemia 08/05/2010    Dr Cristina Gong (GI) has evaluated with EGD, colonoscopy, and video capsular endoscopy in 2011 & 2012.  All have been unrevealing as to an origin of IDA.  OV with Dr Cristina Gong (10/28/10) assessment of blood in stool per hemoccult and GER. Hbg 12.1 g/dL, MCV 91.8, Ferritin 30 ng/mL. Patient taking on ferrous sulfate tab daily.   EGD on 03/06/12 by Dr Cristina Gong for IDA non-obstructing Schatzki's ring at Gastroesophageal junction, otherwise normal esophagus and stomach.     . Macular degeneration, bilateral 10/04/2010    Right eye is wet MD, the other is dry macular degeneration (ARMD). Pt undergoing some form of vascular endothelial growth factor inhibition intraocular therapy.    . VENTRICULAR HYPERTROPHY, LEFT 08/28/2008    Qualifier: Diagnosis of  By: McDiarmid MD, Sherren Mocha    . CHRONIC KIDNEY DISEASE STAGE III (MODERATE) 09/02/2009    Patient with Solitary Kidney S/P total Nephrectomy for transitional cell caner.    Marland Kitchen COPD 04/27/2006    Qualifier: Diagnosis of  By: McDiarmid MD, Sherren Mocha    . MYOCARDIAL INFARCTION, HX OF 09/25/2008    Qualifier: Diagnosis of  By: McDiarmid MD, Todd  Hospitalized for NSTEMI ( 10/02/2008). PCI (10/02/2008, Dr Daneen Schick) with RCA [place] placed: 99% lesion to 0% lesion. Circumflex Obtuse marginal with 90% stenosis (left for medical mangement for now)   Hospitalization (9/8-9/10, Dr Daneen Schick, III, Cardiology) for  Paroxysmal Atrial Fibrillation with RVR and anginal pain secondary to demand/supply mismatch in setting of RVR with known circumflex artery branch disease.    Marland Kitchen SICK SINUS SYNDROME 04/27/2006    Qualifier: Diagnosis of  By: McDiarmid MD, Sherren Mocha  S/P dual chamber pacemaker placement by Dr Osie Cheeks ([facility]) with pacemaker lead extraction & reinsertion - 07/25/2003  Hospitalization (9/8-9/10, Dr Daneen Schick, III, Cardiology) for Paroxysmal Atrial Fibrillation with RVR and anginal pain secondary to demand/supply mismatch in setting of RVR with known circumflex artery branch disease.    . Solitary kidney, acquired 05/17/2010    Surgical removal for transitional cell cancer by Tresa Endo, MD ([facility]). Surveillance cystoscopy by Dr Alinda Money [facility]) on 10/19/12 without evidence of cystoscopic recurrence. Recommend RTC one year for cystoscopy.   Marland Kitchen  ADENOMATOUS COLONIC POLYP 03/01/2003    Qualifier: Diagnosis of  By: McDiarmid MD, Sherren Mocha Multiple benign polyps of cecum, ascending, transverse and sigmoid colon by 8/09 colonoscopy by Dr Cristina Gong  Colonoscopy by Dr Cristina Gong for iron-deficiency anemia on 04/27/2010 showed three sessile polyps that were in ascending (3 mm x 9 mm), transverse (4 mm), and cecum (3 mm).  All three were tubular adenomas that were negative for high grade dysplasia or malignancy on pathology. Dr Cristina Gong called the polpys benign and not requiring follow-up in view of the patients age.    . Soft tissue injury of foot 05/03/2011  . PREDIABETES 09/11/2007    Qualifier: Diagnosis of  By: McDiarmid MD, Sherren Mocha    . Numbness and tingling in hands 07/21/2011  . MUSCLE CRAMPS 03/11/2010    Qualifier: Diagnosis of  By: McDiarmid MD, Sherren Mocha    . Mammogram abnormal 02/15/2011  . LUMBAR SPINAL STENOSIS 04/27/2006    Qualifier: Diagnosis of  By: McDiarmid MD, Francisca December HNP w/ L4&5 root encroachment - 05/24/2001,   Spinal Stenosis: L-S MRI: L4-5 Spinal stenosis, - 03/03/2006, Spondylolithesis L5-S1(mild), DJD  L4-5 on X-Ray 11/04   . Leg cramps 05/29/2012  . 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 Normal uric acid serum level at time of attack.    . Cellulitis of leg, right 10/01/2012  . Solar lentigo 06/15/2012  . Muscle spasm of back 09/24/2013  . 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  No mass lesion seen. Likely cause of patients 2 to 3 episodes of bleeding from patient's right eye.        Surgical History:  Past Surgical History  Procedure Laterality Date  . Pacemaker insertion      Dr Lovena Le ([facility])  . Nephrectomy  For transition cell cancer     Dr Tresa Endo, surgeon  . Replacement total knee  2009, Right knee    Dr Wynelle Link  . Replacement total knee  05/2009, Left knee    Dr Wynelle Link  . Knee arthroscopy w/ synovectomy  11/2009, left knee    Dr Wynelle Link  . Cholecystectomy    . Esophagogastroduodenoscopy  04/27/2010    Dr Cristina Gong - found transient H/H & Schatzki's ring  . Esophagogastroduodenoscopy endoscopy  03/06/2012    Dr Cristina Gong - found non-obstuctive Schatzki's ring at [employer] [facility]. o/w normal EGD.   Marland Kitchen Pacemaker generator change N/A 11/12/2012    Procedure: PACEMAKER GENERATOR CHANGE;  Surgeon: Evans Lance, MD;  Location: [facility] CATH LAB;  Service: Cardiovascular;  Laterality: N/A;     Home Meds: Prior to Admission medications   Medication Sig Start Date End Date Taking? Authorizing Provider  ADVAIR DISKUS 250-50 MCG/DOSE AEPB Inhale 2 puffs into the lungs 2 (two) times daily.  03/14/13  Yes Historical Provider, MD  albuterol (PROVENTIL) (2.5 MG/3ML) 0.083% nebulizer solution Take 3 mLs (2.5 mg total) by nebulization every 6 (six) hours as needed for wheezing or shortness of breath. 08/09/13   Yes Willeen Niece, MD  albuterol (VENTOLIN HFA) 108 (90 BASE) MCG/ACT inhaler Inhale 2 puffs into the lungs every 6 (six) hours as needed. If needed for shortness of breath. 02/14/14  Yes Blane Ohara McDiarmid, MD  B Complex Vitamins (VITAMIN  B COMPLEX PO) Take 1 tablet by mouth daily.   Yes Historical Provider, MD  cholecalciferol (VITAMIN D) 1000 UNITS tablet Take 1,000 Units by mouth daily.   Yes Historical Provider, MD  furosemide (LASIX) 40 MG tablet Take 40 mg by mouth daily as needed for fluid.   Yes Historical Provider, MD  LORazepam (ATIVAN) 0.5 MG tablet TAKE 1 TABLET BY MOUTH EVERY 8 HOURS AS NEEDED FOR ANXIETY 02/10/14  Yes Todd D McDiarmid, MD  metoprolol succinate (TOPROL-XL) 50 MG 24 hr tablet TAKE 1 TABLET BY MOUTH EVERY DAY WITH FOOD   Yes Blane Ohara McDiarmid, MD  NITROSTAT 0.4 MG SL tablet PLACE ONE TABLET UNDER THE TONGUE EVERY FIVE MINUTES AS NEEDED. Patient taking differently: PLACE ONE TABLET UNDER THE TONGUE EVERY FIVE MINUTES AS NEEDED FOR CHEST PAIN.   Yes Lind Covert, MD  omeprazole (PRILOSEC) 40 MG capsule TAKE 1 CAPSULE EVERY DAY. 11/25/13  Yes Lind Covert, MD  predniSONE (DELTASONE) 10 MG tablet Take 3 tablets (30 mg total) by mouth daily with breakfast. 01/14/14  Yes Blane Ohara McDiarmid, MD  simvastatin (ZOCOR) 40 MG tablet TAKE 1 TABLET BY MOUTH EVERY MORNING 11/18/13  Yes Blane Ohara McDiarmid, MD  tiotropium (SPIRIVA) 18 MCG inhalation capsule Place 1 capsule (18 mcg total) into inhaler and inhale daily. 09/25/13  Yes Blane Ohara McDiarmid, MD  vitamin B-12 (CYANOCOBALAMIN) 1000 MCG tablet Take 1,000 mcg by mouth daily.    Yes Historical Provider, MD  warfarin (COUMADIN) 1 MG tablet TAKE TABLETS BY MOUTH AS DIRECTED BY PHYSICIAN. Patient taking differently: TAKE 3MG  ON MONDAY, WEDNESDAY AND FRIDAYS.  TAKE 2MG  ALL OTHER DAYS OF THE WEEK. 03/31/14  Yes Blane Ohara McDiarmid, MD  azithromycin (ZITHROMAX) 250 MG tablet TAKE 2 TABLETS BY MOUTH AT ONCE TODAY, THEN TAKE 1 TABLET BY MOUTH  ONCE DAILY FOR 4 DAYS Patient not taking: Reported on 05/01/2014 12/02/13   Blane Ohara McDiarmid, MD  traMADol (ULTRAM) 50 MG tablet Take 1 tablet (50 mg total) by mouth every 6 (six) hours as needed for moderate pain. Patient not taking: Reported on 05/01/2014 02/10/14   Blane Ohara McDiarmid, MD    Inpatient Medications:     Allergies:  Allergies  Allergen Reactions  . Baclofen Other (See Comments)    Confusion occurred with taking 3 at the same time.  . Codeine Phosphate Nausea Only  . Hydrochlorothiazide Other (See Comments)    Possible acute gouty arthritis of wrist  . Montelukast Sodium Other (See Comments)    Unspecified reaction.     History   Social History  . Marital Status: Widowed    Spouse Name: N/A  . Number of Children: 4  . Years of Education: N/A   Occupational History  . Futures trader     Retired   Social History Main Topics  . Smoking status: Former Smoker    Types: Cigarettes  . Smokeless tobacco: Never Used  . Alcohol Use: 3.0 oz/week    6 Standard drinks or equivalent per week     Comment: 4-5 glasses of wine per week  . Drug Use: No  . Sexual Activity: No   Other Topics Concern  . 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    Semi-retired Futures trader.  Pt has home nebulizer for inhalation therapies.   Former Smoker   Smoking Status:  quit > 5 years ago      (+) DNR status per discussion with Dr McDiarmid 10/25/12 and reiterated 06/20/13 office visit .   (+) Living Will/Advance Directive   (+) HC-POA: Cecile Sheerer Causey (pt's dgt)     History reviewed. No pertinent family history.   Review of Systems: General: negative for chills, fever, night sweats or weight changes.  ENT: negative for rhinorrhea or epistaxis Cardiovascular: Refer to HPI Dermatological:  negative for rash Respiratory: negative for cough or wheezing GI: negative for diarrhea, bright red blood per rectum, melena, or hematemesis GU: no hematuria, urgency, or frequency Neurologic: negative for visual changes, syncope, headache, or dizziness Heme: no easy bruising or bleeding Endo: negative for excessive thirst, thyroid disorder, or flushing Musculoskeletal: negative for joint pain or swelling, negative for myalgias  All other systems reviewed and are otherwise negative except as noted above.  Physical Exam: Blood pressure 100/49, pulse 59, resp. rate 18, SpO2 99 %. General: alert, sitting up in bed, NAD, pleasant  HEENT: [place]/AT, EOMI, sclera anicteric, mucus membranes moist Neck: supple, no JVD CV: RRR, no m/g/r Pulm: mild expiratory wheezes bilaterally, breaths non-labored Abd: BS+, soft, non-tender Ext: warm, trace edema in lower extremities, moves all Neuro: alert and oriented x 3, no focal deficits    Labs:  Recent Labs  05/01/14 0238  TROPONINI <0.03   Lab Results  Component Value Date   WBC 7.6 05/01/2014   HGB 11.8* 05/01/2014   HCT 35.6* 05/01/2014   MCV 93.7 05/01/2014   PLT 235 05/01/2014    Recent Labs Lab 05/01/14 0238  NA 138  K 4.2  CL 103  CO2 25  BUN 36*  CREATININE 1.32*  CALCIUM 8.9  PROT 5.6*  BILITOT 0.4  ALKPHOS 47  ALT 13  AST 24  GLUCOSE 123*   Lab Results  Component Value Date   CHOL 179 05/20/2013   HDL 38* 05/20/2013   LDLCALC 93 05/20/2013   TRIG 242* 05/20/2013   Lab Results  Component Value Date   DDIMER * 12/18/2006    2.99        AT THE INHOUSE ESTABLISHED CUTOFF VALUE OF 0.48 ug/mL FEU, THIS ASSAY HAS BEEN DOCUMENTED IN THE LITERATURE TO HAVE    Radiology/Studies:  Dg Chest 2 View  05/01/2014   CLINICAL DATA:  Acute onset of mid chest pain and shortness of breath. Initial encounter.  EXAM: CHEST  2 VIEW  COMPARISON:  Chest radiograph performed 03/13/2013  FINDINGS: The lungs are hyperexpanded, with  flattening of hemidiaphragms, compatible with COPD. Left basilar airspace opacity is similar in appearance to the prior study and likely reflects scarring. No definite focal airspace consolidation is seen on the lateral view. There is no evidence of pleural effusion or pneumothorax.  The heart is borderline enlarged. A pacemaker is noted at the right chest wall, with leads ending overlying the right atrium and right ventricle. No acute osseous abnormalities are seen.  IMPRESSION: Findings of COPD. Stable appearance to left basilar scarring, without definite airspace consolidation. Borderline cardiomegaly.   Electronically Signed   By: Garald Balding M.D.   On: 05/01/2014 03:07    EKG:  Sinus rhythm with LVH with marked repolarization abnormality  ASSESSMENT AND PLAN:   Atypical Chest Pain: Likely secondary to tachyarrhythmia vs atrial fibrillation. Plan to interrogate pacemaker and can go home if normal. She will need follow up  with Dr. Lovena Le as outpatient.   Charlean Merl MD 05/01/2014, 9:04 AM   Patient seen, examined. Available data reviewed. Agree with findings, assessment, and plan as outlined by Dr Arcelia Jew. The patient was independently interviewed and examined. Her history is most consistent with a tachycardia arrhythmia. She experienced palpitations followed by chest pain. She seems most bothered by the fact that her pacemaker moves in the pocket with certain positional changes. She has not had any exertional chest pain. Her troponin labs are negative here in the emergency department and she has no further complaints at present. We have requested a device interrogation and pending the findings anticipate that she will be okay for discharge. She was recently seen by Dr. Lovena Le and cleared for upcoming hand surgery. Will arrange a hospital follow-up visit with one of our nurse practitioners or physician assistants. As long as she has not had recurrent chest pain, I do not think she requires  stress testing as her symptoms are atypical. She should be able to proceed with surgery without further testing.  Sherren Mocha, M.D. 05/01/2014 11:03 AM

## 2014-05-01 NOTE — ED Notes (Signed)
Pt states that she feels like her pacemaker moves around in her chest. Pt states that she has talked to her cardiologist about this problem, and he hasn't done anything about it.

## 2014-05-01 NOTE — ED Notes (Signed)
Blood sample sent to minilab

## 2014-05-01 NOTE — ED Notes (Signed)
Per EMS, pt started having central chest pain and palpitations around 2200 last night. When pt was placed on EMS monitor, pt was found to be in a-fib. Pt has a hx of a-fib. Pt has a pacemaker in her right chest. Pt took 2 NTG, 324 ASA, and 1mg  Ativan at home. EMS states that the pt's pain had gotten better by the time they arrived, but that the pt doesn't think that the NTG was the cause of the relief. Pt reported some nausea at this time. CBG - 147. EMS reports that the pt's heart rate varied between 80-160 bpm.

## 2014-05-01 NOTE — ED Provider Notes (Signed)
Pt was seen by cardiology and her pace maker was interrogated.   The results were reviewed by cardiology and it was decided that the pt could be discharged home  Maudry Diego, MD 05/01/14 1325

## 2014-05-01 NOTE — ED Notes (Signed)
NAD noted. Pt denies chest pain. Pt discharged by wheelchair.

## 2014-05-05 ENCOUNTER — Other Ambulatory Visit: Payer: Self-pay | Admitting: Family Medicine

## 2014-05-08 ENCOUNTER — Ambulatory Visit (INDEPENDENT_AMBULATORY_CARE_PROVIDER_SITE_OTHER): Payer: Medicare Other | Admitting: *Deleted

## 2014-05-08 ENCOUNTER — Encounter: Payer: Self-pay | Admitting: Family Medicine

## 2014-05-08 ENCOUNTER — Ambulatory Visit (INDEPENDENT_AMBULATORY_CARE_PROVIDER_SITE_OTHER): Payer: Medicare Other | Admitting: Family Medicine

## 2014-05-08 VITALS — BP 108/60 | HR 62 | Temp 98.1°F | Wt 157.0 lb

## 2014-05-08 DIAGNOSIS — I10 Essential (primary) hypertension: Secondary | ICD-10-CM

## 2014-05-08 DIAGNOSIS — E7800 Pure hypercholesterolemia, unspecified: Secondary | ICD-10-CM

## 2014-05-08 DIAGNOSIS — I495 Sick sinus syndrome: Secondary | ICD-10-CM

## 2014-05-08 DIAGNOSIS — D509 Iron deficiency anemia, unspecified: Secondary | ICD-10-CM | POA: Diagnosis not present

## 2014-05-08 DIAGNOSIS — I480 Paroxysmal atrial fibrillation: Secondary | ICD-10-CM

## 2014-05-08 DIAGNOSIS — Z7901 Long term (current) use of anticoagulants: Secondary | ICD-10-CM

## 2014-05-08 DIAGNOSIS — N183 Chronic kidney disease, stage 3 unspecified: Secondary | ICD-10-CM

## 2014-05-08 DIAGNOSIS — Z79899 Other long term (current) drug therapy: Secondary | ICD-10-CM | POA: Diagnosis not present

## 2014-05-08 LAB — COMPREHENSIVE METABOLIC PANEL
ALT: 9 U/L (ref 0–35)
AST: 16 U/L (ref 0–37)
Albumin: 4.1 g/dL (ref 3.5–5.2)
Alkaline Phosphatase: 54 U/L (ref 39–117)
BILIRUBIN TOTAL: 0.5 mg/dL (ref 0.2–1.2)
BUN: 32 mg/dL — AB (ref 6–23)
CO2: 26 meq/L (ref 19–32)
CREATININE: 1.32 mg/dL — AB (ref 0.50–1.10)
Calcium: 8.9 mg/dL (ref 8.4–10.5)
Chloride: 107 mEq/L (ref 96–112)
GLUCOSE: 84 mg/dL (ref 70–99)
Potassium: 3.7 mEq/L (ref 3.5–5.3)
SODIUM: 144 meq/L (ref 135–145)
Total Protein: 6.2 g/dL (ref 6.0–8.3)

## 2014-05-08 LAB — POCT INR: INR: 2.9

## 2014-05-08 LAB — LDL CHOLESTEROL, DIRECT: LDL DIRECT: 74 mg/dL

## 2014-05-09 ENCOUNTER — Telehealth: Payer: Self-pay | Admitting: Family Medicine

## 2014-05-09 ENCOUNTER — Encounter: Payer: Self-pay | Admitting: Family Medicine

## 2014-05-09 NOTE — Assessment & Plan Note (Signed)
CBC:  HGB 11.8* 05/01/2014 0238   HCT 35.6* 05/01/2014 0238   Stable hemoglobin off iron supplement Continue off iron supplement. Periodic monitoring or if symptoms develop

## 2014-05-09 NOTE — Assessment & Plan Note (Signed)
Currently in a regular rate and rhythm Ms Balthrop is to follow up with Dr Lovena Le later this month. No abnormalities reported on recent 05/01/14 interrogation of pacemaker in ED

## 2014-05-09 NOTE — Assessment & Plan Note (Signed)
Possible self-resolving tachyarrhythmia on 05/01/14 with associated chest pain. Patient with history of 90% stenosed Circumflex artery branch that could contribute to angina pain with tachyarrhythmias.    No recurrence.  Evaluated byDr Burt Knack (Card) in ED on 3/3. No recommendations for further ischemic work-up. Patient on Warfarin and Metoprolol 50 XL daily.  Pt to see Dr Lovena Le ([facility]) later this month to check PM.  Observe for now for recurrence

## 2014-05-09 NOTE — Telephone Encounter (Signed)
I spoke with [name] about her continued slight rise in serum creatinine.  I re-emphasized that I would like her to decrease her use of Lasix for subjective leg edema to just once a week, if at all.  [name] agreed to the change.

## 2014-05-09 NOTE — Assessment & Plan Note (Signed)
Slight rise in Cr to 1.3 on ED visit 05/01/14. Intentional weight loss of ~ 15 pounds in last year. Relatively low BPs recently.  Increase in Lasix to three times a week (self-initiated) recently for subjective bilateral leg edema. Will check BMET Encouraged patient to reduce Lasix to only twice a week

## 2014-05-09 NOTE — Progress Notes (Signed)
   Subjective:    Patient ID: [name], female    DOB: 03-Nov-1923, 81 y.o.   MRN: [id number]  HPI Problem List Items Addressed This Visit    SICK SINUS SYNDROME (Chronic) - Onset September 2012 - Dual Chamber Pacemaker followed by Dr Lovena Le (EPS-Card) - ED visit 05/01/14 with complaint of chest pain, chest flutter sensation, noisy pacemaker and excessive movement of pacemaker device along chest wall - Dr Burt Kn[facility]) thought patient may have had episode of tachyarrythmia Vs. atrial fibrillation that resolved spontaneously.  Interrogated Pacemaker was unrevealing. Follow up with Dr Lovena Le planned for later this month.  - No recurrence of symptoms since 3/3.   CHRONIC KIDNEY DISEASE STAGE III (MODERATE) - Primary (Chronic) - Longstanding issue - Solitary kidney after nephrectomy for transitional cell cancer.  - Creatine slightly elevated to 1.3 in ED visit 3/3 from usual around 1.1 - Patient taking Lasix 40 mg three times a week which is an increase from previous.  Takes for chronic leg edema.    AF (paroxysmal atrial fibrillation) (Chronic) - Onset September 2012 - See SSS above.  - On Warfarin and Metoprolol XL 50 mg daily which she takes - No bleeding, no melena, no bright red blood per rectum, no epistaxis.  - No syncope nor near-syncope.  No new DOE or orthopnea.  - No recurrence of flutter sensation in chest since 3/3 episode.    Iron deficiency anemia - Longstanding issue thought to be due to intestinal AVMs.  Thorough work-up of GI tract by Dr Jackquline Denmark in the not too distant past. - Not currently on iron supplement. - No overt signs of GI bleeding.    HYPERTENSION, BENIGN ESSENTIAL (Chronic) - Chronic Condition that has been well controlled - Intentional weight loss of nearly 15 pounds in last year - Taking Lasix 40 mg three times a week for subjective bilateral leg edema.  - Taking metoprolol XL 50 mg daily.  - Episode of chest pain with possible  supraventricular tachyarrythmia on 3/3. Ms Rourke has history of Hospitalized for NSTEMI ( 10/02/2008). PCI (10/02/2008, Dr Daneen Schick) with RCA [place] placed: 99% lesion to 0% lesion. Circumflex Obtuse marginal with 90% stenosis (left for medical mangement for now). Hospitalization (9/8-9/10, Dr Daneen Schick, III, Cardiology) for Paroxysmal Atrial Fibrillation with RVR and anginal pain secondary to demand/supply mismatch in setting of RVR with known circumflex artery branch    Pure hypercholesterolemia  - Longstanding issue - On Simvastatin for secondary prevention. - No jaundice nor RUQ abdominal pain.  - Actively pursuing weight loss with reduction in sweets.        Smoking history reviewed  Review of Systems See HPI    Objective:   Physical Exam  VS reviewed GEN: Alert, Cooperative, Groomed, NAD COR: RRR, No M/G/R, No JVD, Normal PMI size and location LUNGS: BCTA, No Acc mm use, speaking in full sentences ABDOMEN: (+)BS, soft, NT, ND, No HSM, No palpable masses EXT: No peripheral leg edema.   Neuro: Oriented to person, place, and time;  Gait: Able to go from sit-to-stand without using arms of chair.Gait speed estimated at > 1 m/s, No significant path deviation, Step through +,  Psych: Normal affect/thought/speech/language    Assessment & Plan:

## 2014-05-13 ENCOUNTER — Encounter (HOSPITAL_COMMUNITY): Payer: Self-pay

## 2014-05-13 ENCOUNTER — Encounter (HOSPITAL_COMMUNITY)
Admission: RE | Admit: 2014-05-13 | Discharge: 2014-05-13 | Disposition: A | Discharge status: Home / Self Care | Payer: Medicare Other | Source: Ambulatory Visit | Attending: Orthopedic Surgery | Admitting: Orthopedic Surgery

## 2014-05-13 DIAGNOSIS — I129 Hypertensive chronic kidney disease with stage 1 through stage 4 chronic kidney disease, or unspecified chronic kidney disease: Secondary | ICD-10-CM | POA: Insufficient documentation

## 2014-05-13 DIAGNOSIS — J449 Chronic obstructive pulmonary disease, unspecified: Secondary | ICD-10-CM | POA: Insufficient documentation

## 2014-05-13 DIAGNOSIS — I252 Old myocardial infarction: Secondary | ICD-10-CM | POA: Diagnosis not present

## 2014-05-13 DIAGNOSIS — N183 Chronic kidney disease, stage 3 (moderate): Secondary | ICD-10-CM | POA: Insufficient documentation

## 2014-05-13 DIAGNOSIS — Z01812 Encounter for preprocedural laboratory examination: Secondary | ICD-10-CM | POA: Insufficient documentation

## 2014-05-13 LAB — BASIC METABOLIC PANEL
ANION GAP: 10 (ref 5–15)
BUN: 25 mg/dL — ABNORMAL HIGH (ref 6–23)
CO2: 26 mmol/L (ref 19–32)
Calcium: 9.2 mg/dL (ref 8.4–10.5)
Chloride: 105 mmol/L (ref 96–112)
Creatinine, Ser: 1.43 mg/dL — ABNORMAL HIGH (ref 0.50–1.10)
GFR calc Af Amer: 37 mL/min — ABNORMAL LOW (ref >90)
GFR, EST NON AFRICAN AMERICAN: 32 mL/min — AB (ref >90)
Glucose, Bld: 140 mg/dL — ABNORMAL HIGH (ref 70–99)
POTASSIUM: 4.3 mmol/L (ref 3.5–5.1)
SODIUM: 141 mmol/L (ref 135–145)

## 2014-05-13 LAB — CBC
HCT: 38.4 % (ref 36.0–46.0)
Hemoglobin: 12.4 g/dL (ref 12.0–15.0)
MCH: 30.8 pg (ref 26.0–34.0)
MCHC: 32.3 g/dL (ref 30.0–36.0)
MCV: 95.5 fL (ref 78.0–100.0)
PLATELETS: 215 10*3/uL (ref 150–400)
RBC: 4.02 MIL/uL (ref 3.87–5.11)
RDW: 12.7 % (ref 11.5–15.5)
WBC: 7.1 10*3/uL (ref 4.0–10.5)

## 2014-05-13 NOTE — Progress Notes (Addendum)
Anesthesia Chart Review:  Pt is 81 year old female scheduled for R hand A-1 pulley release, dupuytren release and repair as necessary R ring finger on 05/22/2014 with Dr. Amedeo Plenty.   Cardiologist is Dr. Lovena Le.   PMH includes: Sick sinus syndrome with tachycardia, atrial fibrillation, pacemaker ([employer]), MI, HTN, COPD, CKD (stage III), anemia, vitamin B12 deficiency, transitional cell carcinoma of ureter.   Medications include: lasix, metoprolol, coumadin. Pt is to stop coumadin on 05/17/14.   Pt seen in ED 05/01/2014 for chest pain. Thought to be secondary to tachyarrhythmia vs atrial fibrillation. Prior to this was cleared for surgery by Dr. Lovena Le.  Has f/u scheduled with [facility] 05/16/2014. Will reassess pt readiness for surgery after that visit.   Preoperative labs reviewed.  Cr 1.43.   Chest x-ray 05/01/2014 reviewed. Findings of COPD. Stable appearance to left basilar scarring, without definite airspace consolidation. Borderline cardiomegaly.  EKG 05/01/2014: Junctional rhythm. LVH with secondary repolarization abnormality. Anterior Q waves, possibly due to LVH  Cardiac cath 10/02/2008: 1. Severe double-vessel coronary artery disease with severe obstruction in the mid right coronary artery and also severe ostial obstruction of second obtuse marginal, which arises within a trifurcation from the mid circumflex that is formed by the continuation of the circumflex and the first obtuse marginal.The distribution of the second marginal is moderate. 2. Left ventricular cavity contour suggests apical hypertrophy. ontractility is normal with ejection fraction of 50%. No mitral regurgitation. 3. Successful bare-metal stent implantation in the mid right coronary from 99% to 0% TIMI grade 3 flow.  Perioperative prescription form for implanted cardiac devices notes procedure may interfere with device function. Magnet should be placed over device during procedure. Post-op interrogation not needed.    Willeen Cass, FNP-BC [facility]/Anesthesiology Phone: [phone] 05/13/2014 3:07 PM  Addendum: Pt seen by Truitt Merle, NP with cardiology today. Did not do complete visit, but did document in her note that pt denies active symptoms.  Discussed with Dr. Marcie Bal. Given Dr. Antionette Char note dated 05/01/2014 states pt should be able to proceed with surgery without stress testing if she remains symptom free, and as pt denied symptoms today to Ms. Servando Snare, I anticipate pt can proceed with surgery as scheduled.   Willeen Cass, FNP-BC [facility]/Anesthesiology Phone: [phone] 05/16/2014 4:50 PM

## 2014-05-13 NOTE — Pre-Procedure Instructions (Signed)
[name]  05/13/2014   Your procedure is scheduled on: Thursday, March 24th   Report to [facility] Admitting at 11:00 AM.   Call this number if you have problems the morning of surgery: [phone]   Remember:   Do not eat food or drink liquids after midnight Wednesday.   Take these medicines the morning of surgery with A SIP OF WATER: Prilosec, Metoprolol, Ativan.  Please use your nebulizer and inhalers the morning of surgery.   Do not wear jewelry, make-up or nail polish.  Do not wear lotions, powders, or perfumes. You may NOT wear deodorant the day of surgery.  Do not shave underarms & legs 48 hours prior to surgery.   Do not bring valuables to the hospital.  [facility] is not responsible for any belongings or valuables.               Contacts, dentures or bridgework may not be worn into surgery.  Leave suitcase in the car. After surgery it may be brought to your room.  For patients admitted to the hospital, discharge time is determined by your treatment team.               Patients discharged the day of surgery will not be allowed to drive home.   Name and phone number of your driver:    Special Instructions: "Preparing for Surgery" instruction sheet.   Please read over the following fact sheets that you were given: Pain Booklet, Coughing and Deep Breathing and Surgical Site Infection Prevention

## 2014-05-13 NOTE — Progress Notes (Addendum)
Dr. Lovena Le has noted that patient is at low risk for cardiac issue and she may proceed with hand surgery (in Epic) Patient was told by Dr. Lovena Le instructed her to stop coumadin 4-5 days prior to .Marland KitchenMarland KitchenHer last dose will be Saturday 19th.

## 2014-05-16 ENCOUNTER — Encounter: Payer: Medicare Other | Admitting: Nurse Practitioner

## 2014-05-16 ENCOUNTER — Telehealth: Payer: Self-pay | Admitting: Nurse Practitioner

## 2014-05-16 ENCOUNTER — Encounter: Payer: Self-pay | Admitting: Nurse Practitioner

## 2014-05-16 NOTE — Progress Notes (Signed)
Patient ID: RYANNAH BROOMELL, female   DOB: 05-19-1923, 81 y.o.   MRN: [id number]   Patient was on my schedule today for a post hospital visit.   Upon bringing her back to the exam room she asked why she was here. Says she has already been given pre op clearance and has not had her surgery yet. She has no active symptoms. She does not feel she needs to be seen.   I could not see the reason for this visit either.   Will be available as needed and see back as planned.   Patient is agreeable to this plan and will call if any problems develop in the interim.    Burtis Junes, RN, [facility] [address] [place], [place]  [phone]

## 2014-05-16 NOTE — Telephone Encounter (Signed)
New Msg        Neena Rhymes from [facility] calling,   States pt is scheduled for surgery next week and needs some clarifications on some notes.  States pt had chest pain?   Please return call to [phone].

## 2014-05-16 NOTE — Telephone Encounter (Signed)
S/w Neena Rhymes from [facility] about pt.  Stated was Cecille Rubin going to clear pt for surgery after pt was seen in ed for chest pain,  stated we did not know why pt was being seen in our office today and either did pt. Stated pt left and Cecille Rubin never worked pt up.  Cecille Rubin stated to call pt back and get pt appointment for Monday.  Pt is agreeable to plan will come in on Monday, March 21st for ov appointment at 11:30.

## 2014-05-19 ENCOUNTER — Encounter: Payer: Self-pay | Admitting: Nurse Practitioner

## 2014-05-19 ENCOUNTER — Ambulatory Visit (INDEPENDENT_AMBULATORY_CARE_PROVIDER_SITE_OTHER): Payer: Medicare Other | Admitting: Nurse Practitioner

## 2014-05-19 VITALS — BP 122/70 | HR 62 | Ht 64.0 in | Wt 162.6 lb

## 2014-05-19 DIAGNOSIS — Z01818 Encounter for other preprocedural examination: Secondary | ICD-10-CM

## 2014-05-19 NOTE — Progress Notes (Signed)
CARDIOLOGY OFFICE NOTE  Date:  05/19/2014    [name] Date of Birth: [birth year] Medical Record [id number]  PCP:  Acquanetta Sit, MD  Cardiologist:  Clearence Cheek  Chief Complaint  Patient presents with  . Pre-op Exam    Pre op visit - seen for Dr. [name] and Dr. Lovena Le    History of Present Illness: [name] is a 81 y.o. female who presents today for a pre op visit. She is seen for Dr. [name] and Dr. Lovena Le. She has a PMHx of atrial fibrillation on coumadin, CAD with MI in 2010 and remote BMS to the RCA, pacemaker (last interrogated 04/08/14-normal), CKD Stage 3, and COPD/asthma.   She was last seen by Dr. [name] back in July. Had had some chest pain just prior to that visit - etiology was uncertain but he felt like angina was less likely given her functional status and no exertional symptoms.  Seen by Dr. Lovena Le in early February who cleared her for hand surgery that is planned for later this week. Noted to be maintaining NSR 99% of the time from her device check.   She presented to the ED with chest pain on 05/01/2014 - seen in consultation by Dr. Burt Knack. Patient reported that she felt a "flutter" last night around 11 PM after walking back from the bathroom. She then noted chest pain with deep inspiration that she describes as substernal, "burning" in sensation, radiating to her left arm, and associated with dyspnea and palpitations. Patient was having a difficult time quantifying the pain. She thought her pain was related to anxiety so she asked her daughter to bring her medications and she took 2 Ativan (0.5 mg each), which mildly helped the pain. EMS was called. She was given nitro by EMS but is unsure if that helped the pain. She states she no longer has the pain. On retrospect, she noted that her pacemaker had made a "funny noise, almost like a hissing" and that it had "flipped over" while she was laying on her left side. She denies any nausea, vomiting, abdominal pain, or  diaphoresis. She denies any recent chest pain on exertion. She follows with Dr. Lovena Le.   Troponin negative x 1 in the ED. INR supratherapeutic at 3.63.   Her chest pain was felt to be atypical - likely secondary to tachyarrhythmia vs AF. Her device was interrogated. She was still felt to be ok for her hand surgery and was discharged back home.   Comes back today. Here alone. She is doing fine. She has no chest pain. Not short of breath. Remains very active. No problems with her rhythm. Tolerating her medicines - she is off of her coumadin for her upcoming surgery. Her only issue is that she actually tapes down her pacemaker each night because she feels like it is moving in her chest.    Past Medical History  Diagnosis Date  . Candidiasis of the esophagus 11/26/2007  . Myocardial infarct, old   . COPD, severe   . Sick sinus syndrome with tachycardia   . Pacemaker   . Chronic kidney disease (CKD), stage III (moderate)   . Hypertension   . Acute appendicitis with rupture   . Spinal stenosis, lumbar   . Adrenal adenoma     Incidentaloma  . Spondylolisthesis of lumbar region   . Lumbar herniated disc     History of HNP L4/5 in 2003  . Hx of colonoscopy with polypectomy 04/27/2010  Dr Cristina Gong found three  tubular adenomas each less than 10 mm size  . Retinal hemorrhage of left eye 06/2010  . Cataract 2013    Bilateral   . Abnormal mammogram, unspecified 08/23/2010    Followup imaging reassuring.  Repeat in 6 months.   Marland Kitchen DISC WITH RADICULOPATHY 04/27/2006    Qualifier: Diagnosis of  By: McDiarmid MD, Sherren Mocha    . Transitional cell carcinoma of ureter, history   . Mild cognitive impairment 10/26/2012    (10/25/12) Failed MiniCog screen  . 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 Normal uric acid serum level at time of attack.    Marland Kitchen COPD (chronic obstructive pulmonary disease)   . EDEMA-LEGS,DUE TO VENOUS OBSTRUCT.  04/27/2006    Qualifier: Diagnosis of  By: McDiarmid MD, Sherren Mocha    . ANXIETY 04/27/2006    Qualifier: Diagnosis of  By: McDiarmid MD, Sherren Mocha    . HERNIA, HIATAL, NONCONGENITAL 04/27/2006    Qualifier: Diagnosis of  By: McDiarmid MD, Sherren Mocha    . History of Hemorrhoids 04/27/2006    Qualifier: Diagnosis of  By: McDiarmid MD, Sherren Mocha    . RHINITIS, ALLERGIC 04/27/2006    Qualifier: Diagnosis of  By: McDiarmid MD, Sherren Mocha    . SCHATZKI'S RING, HX OF 11/26/2007    Qualifier: Diagnosis of  By: McDiarmid MD, Sherren Mocha  An EGD was performed by Dr Cristina Gong on 04/27/2010 for iron deficiency anemia. There was a a transient hiatal hernia with Schatzki's ring. Stomach and duodenum were normal. EGD on 03/06/12 by Dr Cristina Gong for IDA non-obstructing Schatzki's ring at Gastroesophageal junction, otherwise normal esophagus and stomach.    . Urge incontinence 12/13/2011    Diagnosed in 10/2011 by Dr Bjorn Loser (Urology)   . VITAMIN B12 DEFICIENCY 10/07/2009    Qualifier: Diagnosis of  By: McDiarmid MD, Sherren Mocha  Dx based on a post-TKR anemia work-up Low normal serum B12 with high Methylmalonic acid and homocysteine level  Vit B12 serum level (10/28/10) > 1500 pg/mL   . Vitamin D deficiency 11/02/2010    Serum vitamin D 25(OH) = 10.9 ng/mL (30 -100) on 10/28/10 c/w Vitamin D deficiency.     . AF (paroxysmal atrial fibrillation) 11/11/2010    Hospitalization (9/8-9/10, Dr Daneen Schick, III, Cardiology) for Paroxysmal Atrial Fibrillation with RVR and anginal pain secondary to demand/supply mismatch in setting of RVR with known circumflex artery branch disease.    Marland Kitchen HYPERTENSION, BENIGN ESSENTIAL 06/18/2007    Qualifier: Diagnosis of  By: McDiarmid MD, Sherren Mocha    . Iron deficiency anemia 08/05/2010    Dr Cristina Gong (GI) has evaluated with EGD, colonoscopy, and video capsular endoscopy in 2011 & 2012.  All have been unrevealing as to an origin of IDA.  OV with Dr Cristina Gong (10/28/10) assessment of blood in stool per hemoccult and GER. Hbg 12.1 g/dL, MCV 91.8,  Ferritin 30 ng/mL. Patient taking on ferrous sulfate tab daily.   EGD on 03/06/12 by Dr Cristina Gong for IDA non-obstructing Schatzki's ring at Gastroesophageal junction, otherwise normal esophagus and stomach.     . Macular degeneration, bilateral 10/04/2010    Right eye is wet MD, the other is dry macular degeneration (ARMD). Pt undergoing some form of vascular endothelial growth factor inhibition intraocular therapy.    . VENTRICULAR HYPERTROPHY, LEFT 08/28/2008    Qualifier: Diagnosis of  By: McDiarmid MD, Sherren Mocha    . CHRONIC KIDNEY DISEASE STAGE III (MODERATE) 09/02/2009  Patient with Solitary Kidney S/P total Nephrectomy for transitional cell caner.    Marland Kitchen COPD 04/27/2006    Qualifier: Diagnosis of  By: McDiarmid MD, Sherren Mocha    . MYOCARDIAL INFARCTION, HX OF 09/25/2008    Qualifier: Diagnosis of  By: McDiarmid MD, Todd  Hospitalized for NSTEMI ( 10/02/2008). PCI (10/02/2008, Dr Daneen Schick) with RCA [place] placed: 99% lesion to 0% lesion. Circumflex Obtuse marginal with 90% stenosis (left for medical mangement for now)   Hospitalization (9/8-9/10, Dr Daneen Schick, III, Cardiology) for Paroxysmal Atrial Fibrillation with RVR and anginal pain secondary to demand/supply mismatch in setting of RVR with known circumflex artery branch disease.    Marland Kitchen SICK SINUS SYNDROME 04/27/2006    Qualifier: Diagnosis of  By: McDiarmid MD, Sherren Mocha  S/P dual chamber pacemaker placement by Dr Osie Cheeks ([facility]) with pacemaker lead extraction & reinsertion - 07/25/2003  Hospitalization (9/8-9/10, Dr Daneen Schick, III, Cardiology) for Paroxysmal Atrial Fibrillation with RVR and anginal pain secondary to demand/supply mismatch in setting of RVR with known circumflex artery branch disease.    . Solitary kidney, acquired 05/17/2010    Surgical removal for transitional cell cancer by Tresa Endo, MD ([facility]). Surveillance cystoscopy by Dr Alinda Money [facility]) on 10/19/12 without evidence of cystoscopic recurrence. Recommend RTC one year  for cystoscopy.   . ADENOMATOUS COLONIC POLYP 03/01/2003    Qualifier: Diagnosis of  By: McDiarmid MD, Sherren Mocha Multiple benign polyps of cecum, ascending, transverse and sigmoid colon by 8/09 colonoscopy by Dr Cristina Gong  Colonoscopy by Dr Cristina Gong for iron-deficiency anemia on 04/27/2010 showed three sessile polyps that were in ascending (3 mm x 9 mm), transverse (4 mm), and cecum (3 mm).  All three were tubular adenomas that were negative for high grade dysplasia or malignancy on pathology. Dr Cristina Gong called the polpys benign and not requiring follow-up in view of the patients age.    . Soft tissue injury of foot 05/03/2011  . PREDIABETES 09/11/2007    Qualifier: Diagnosis of  By: McDiarmid MD, Sherren Mocha    . Numbness and tingling in hands 07/21/2011  . MUSCLE CRAMPS 03/11/2010    Qualifier: Diagnosis of  By: McDiarmid MD, Sherren Mocha    . Mammogram abnormal 02/15/2011  . LUMBAR SPINAL STENOSIS 04/27/2006    Qualifier: Diagnosis of  By: McDiarmid MD, Francisca December HNP w/ L4&5 root encroachment - 05/24/2001,   Spinal Stenosis: L-S MRI: L4-5 Spinal stenosis, - 03/03/2006, Spondylolithesis L5-S1(mild), DJD L4-5 on X-Ray 11/04   . Leg cramps 05/29/2012  . 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 Normal uric acid serum level at time of attack.    . Cellulitis of leg, right 10/01/2012  . Solar lentigo 06/15/2012  . Muscle spasm of back 09/24/2013  . 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  No mass lesion seen. Likely cause of patients 2 to 3 episodes of bleeding from patient's right eye.      Past Surgical History  Procedure Laterality Date  . Pacemaker insertion      Dr Lovena Le ([facility])  . Nephrectomy  For transition cell cancer      Dr Tresa Endo, surgeon  . Replacement total knee  2009, Right knee    Dr Wynelle Link  .  Replacement total knee  05/2009, Left knee    Dr Wynelle Link  . Knee arthroscopy w/ synovectomy  11/2009, left knee    Dr Wynelle Link  . Cholecystectomy    . Esophagogastroduodenoscopy  04/27/2010    Dr Cristina Gong - found transient H/H & Schatzki's ring  . Esophagogastroduodenoscopy endoscopy  03/06/2012    Dr Cristina Gong - found non-obstuctive Schatzki's ring at [employer] [facility]. o/w normal EGD.   Marland Kitchen Pacemaker generator change N/A 11/12/2012    Procedure: PACEMAKER GENERATOR CHANGE;  Surgeon: Evans Lance, MD;  Location: [facility] CATH LAB;  Service: Cardiovascular;  Laterality: N/A;  . Tonsillectomy    . Breast surgery      breast reduction  . Eye surgery      bilateral cataracts     Medications: Current Outpatient Prescriptions  Medication Sig Dispense Refill  . ADVAIR DISKUS 250-50 MCG/DOSE AEPB Inhale 2 puffs into the lungs 2 (two) times daily.     Marland Kitchen albuterol (PROVENTIL) (2.5 MG/3ML) 0.083% nebulizer solution Take 3 mLs (2.5 mg total) by nebulization every 6 (six) hours as needed for wheezing or shortness of breath. 75 mL 4  . albuterol (VENTOLIN HFA) 108 (90 BASE) MCG/ACT inhaler Inhale 2 puffs into the lungs every 6 (six) hours as needed. If needed for shortness of breath. 1 Inhaler 3  . B Complex Vitamins (VITAMIN B COMPLEX PO) Take 1 tablet by mouth daily.    . cholecalciferol (VITAMIN D) 1000 UNITS tablet Take 1,000 Units by mouth daily.    . furosemide (LASIX) 40 MG tablet Take 40 mg by mouth as directed. 1 tablet once a week    . LORazepam (ATIVAN) 0.5 MG tablet TAKE 1 TABLET BY MOUTH EVERY 8 HOURS AS NEEDED FOR ANXIETY 30 tablet 1  . metoprolol succinate (TOPROL-XL) 50 MG 24 hr tablet TAKE 1 TABLET BY MOUTH EVERY DAY WITH FOOD 90 tablet 3  . NITROSTAT 0.4 MG SL tablet PLACE ONE TABLET UNDER THE TONGUE EVERY FIVE MINUTES AS NEEDED. 25 tablet 3  . omeprazole (PRILOSEC) 40 MG capsule TAKE 1 CAPSULE EVERY DAY. 90 capsule 2    . simvastatin (ZOCOR) 40 MG tablet TAKE 1 TABLET BY MOUTH EVERY MORNING 90 tablet 3  . tiotropium (SPIRIVA) 18 MCG inhalation capsule Place 1 capsule (18 mcg total) into inhaler and inhale daily. 30 capsule PRN  . traMADol (ULTRAM) 50 MG tablet Take 1 tablet (50 mg total) by mouth every 6 (six) hours as needed for moderate pain. 30 tablet 1  . vitamin B-12 (CYANOCOBALAMIN) 1000 MCG tablet Take 1,000 mcg by mouth daily.     Marland Kitchen warfarin (COUMADIN) 1 MG tablet TAKE TABLETS BY MOUTH AS DIRECTED BY PHYSICIAN. (Patient taking differently: TAKE 3MG  ON MONDAY, WEDNESDAY AND FRIDAYS.  TAKE 2MG  ALL OTHER DAYS OF THE WEEK.) 270 tablet 0   No current facility-administered medications for this visit.    Allergies: Allergies  Allergen Reactions  . Baclofen Other (See Comments)    Confusion occurred with taking 3 at the same time.  . Codeine Phosphate Nausea Only  . Hydrochlorothiazide Other (See Comments)    Possible acute gouty arthritis of wrist  . Montelukast Sodium Other (See Comments)    Unspecified reaction.     Social History: The patient  reports that she has quit smoking. Her smoking use included Cigarettes. She has never used smokeless tobacco. She reports that she drinks about 12.0 oz of alcohol per week. She reports that she does not use illicit drugs.   Family  History: The patient's family history is not on file. Her parents are deceased.   Review of Systems: Please see the history of present illness.    All other systems are reviewed and negative.   Physical Exam: VS:  BP 122/70 mmHg  Pulse 62  Ht 5\' 4"  (1.626 m)  Wt 162 lb 9.6 oz (73.755 kg)  BMI 27.90 kg/m2 .  BMI Body mass index is 27.9 kg/(m^2).  Wt Readings from Last 3 Encounters:  05/19/14 162 lb 9.6 oz (73.755 kg)  05/08/14 157 lb (71.215 kg)  04/08/14 161 lb 6.4 oz (73.211 kg)    General: Pleasant. Well developed, well nourished and in no acute distress. She looks younger than her stated age.  HEENT:  Normal. Neck: Supple, no JVD, carotid bruits, or masses noted.  Cardiac: Regular rate and rhythm. No murmurs, rubs, or gallops. No edema.  Respiratory:  Lungs are clear to auscultation bilaterally with normal work of breathing.  GI: Soft and nontender.  MS: No deformity or atrophy. Gait and ROM intact. Skin: Warm and dry. Color is normal.  Neuro:  Strength and sensation are intact and no gross focal deficits noted.  Psych: Alert, appropriate and with normal affect.   LABORATORY DATA:  EKG:  EKG is ordered today. This demonstrates junctional rhythm with LVH and repolarization. Unchanged.  Lab Results  Component Value Date   WBC 7.1 05/13/2014   HGB 12.4 05/13/2014   HCT 38.4 05/13/2014   PLT 215 05/13/2014   GLUCOSE 140* 05/13/2014   CHOL 179 05/20/2013   TRIG 242* 05/20/2013   HDL 38* 05/20/2013   LDLDIRECT 74 05/08/2014   LDLCALC 93 05/20/2013   ALT 9 05/08/2014   AST 16 05/08/2014   NA 141 05/13/2014   K 4.3 05/13/2014   CL 105 05/13/2014   CREATININE 1.43* 05/13/2014   BUN 25* 05/13/2014   CO2 26 05/13/2014   TSH 1.821 05/29/2012   INR 2.9 05/08/2014   HGBA1C 5.9 10/17/2013    BNP (last 3 results) No results for input(s): BNP in the last 8760 hours.  ProBNP (last 3 results) No results for input(s): PROBNP in the last 8760 hours.   Other Studies Reviewed Today: N/A  Assessment/Plan: 1. Pre op clearance - no present complaints. Doing well from our standpoint. Felt to be low risk and can proceed on as planned. Will be available as needed.   2. Underlying PPM -  Sees Dr. Lovena Le.   3. PAF  4. Chronic anticoagulation  5. CAD - no exertional symptoms. Continue with medical management.    Current medicines are reviewed with the patient today.  The patient does not have concerns regarding medicines other than what has been noted above.  The following changes have been made:  See above.  Labs/ tests ordered today include:    Orders Placed This Encounter   Procedures  . EKG 12-Lead    Disposition:   See back as planned.  Patient is agreeable to this plan and will call if any problems develop in the interim.   Signed: Burtis Junes, RN, ANP-C 05/19/2014 12:03 PM  [facility] [address] [place], [place]  [address] Phone: [phone] Fax: [phone]

## 2014-05-19 NOTE — Patient Instructions (Signed)
Ok to proceed with your surgery  Will see back as planned  Call the [place] office at [phone] if you have any questions, problems or concerns.

## 2014-05-20 DIAGNOSIS — H3532 Exudative age-related macular degeneration: Secondary | ICD-10-CM | POA: Diagnosis not present

## 2014-05-21 MED ORDER — CHLORHEXIDINE GLUCONATE 4 % EX LIQD
60.0000 mL | Freq: Once | CUTANEOUS | Status: DC
  Filled 2014-05-21: qty 60

## 2014-05-21 MED ORDER — SODIUM CHLORIDE 0.45 % IV SOLN: INTRAVENOUS | Status: DC

## 2014-05-21 MED ORDER — CEFAZOLIN SODIUM-DEXTROSE 2-3 GM-% IV SOLR
2.0000 g | INTRAVENOUS | Status: DC
  Filled 2014-05-21: qty 50

## 2014-05-21 NOTE — Anesthesia Preprocedure Evaluation (Addendum)
Anesthesia Evaluation  Patient identified by MRN, date of birth, ID band Patient awake    Reviewed: Allergy & Precautions, NPO status , Patient's Chart, lab work & pertinent test results  Airway Mallampati: II   Neck ROM: Full    Dental  (+) Edentulous Upper, Partial Lower, Dental Advisory Given   Pulmonary COPDformer smoker,  breath sounds clear to auscultation        Cardiovascular hypertension, Pt. on medications Dysrhythmias: hx of AF, coumadin to stop for surgery. Atrial Fibrillation + pacemaker (SSS) Rhythm:Regular  Cath 2010 Bare metal STENT, EF 50%, followed by cadio, cleared for surgery   Neuro/Psych Anxiety    GI/Hepatic GERD-  Medicated,  Endo/Other    Renal/GU Renal InsufficiencyRenal diseaseCreat 1.4     Musculoskeletal   Abdominal (+)  Abdomen: soft.    Peds  Hematology 12/38   Anesthesia Other Findings   Reproductive/Obstetrics                           Anesthesia Physical Anesthesia Plan  ASA: III  Anesthesia Plan: General   Post-op Pain Management:    Induction: Intravenous  Airway Management Planned: Oral ETT  Additional Equipment:   Intra-op Plan:   Post-operative Plan: Extubation in OR  Informed Consent: I have reviewed the patients History and Physical, chart, labs and discussed the procedure including the risks, benefits and alternatives for the proposed anesthesia with the patient or authorized representative who has indicated his/her understanding and acceptance.     Plan Discussed with:   Anesthesia Plan Comments: (Multimodal pain RX)        Anesthesia Quick Evaluation

## 2014-05-22 ENCOUNTER — Encounter (HOSPITAL_COMMUNITY): Payer: Self-pay | Admitting: *Deleted

## 2014-05-22 ENCOUNTER — Ambulatory Visit (HOSPITAL_COMMUNITY): Payer: Medicare Other | Admitting: Emergency Medicine

## 2014-05-22 ENCOUNTER — Encounter (HOSPITAL_COMMUNITY)
Admission: RE | Disposition: A | Discharge status: Home / Self Care | Payer: Medicare Other | Source: Ambulatory Visit | Attending: Orthopedic Surgery

## 2014-05-22 ENCOUNTER — Ambulatory Visit (HOSPITAL_COMMUNITY): Payer: Medicare Other | Admitting: Anesthesiology

## 2014-05-22 ENCOUNTER — Ambulatory Visit (HOSPITAL_COMMUNITY)
Admission: RE | Admit: 2014-05-22 | Discharge: 2014-05-22 | Disposition: A | Discharge status: Home / Self Care | Payer: Medicare Other | Source: Ambulatory Visit | Attending: Orthopedic Surgery | Admitting: Orthopedic Surgery

## 2014-05-22 DIAGNOSIS — Z86010 Personal history of colonic polyps: Secondary | ICD-10-CM | POA: Diagnosis not present

## 2014-05-22 DIAGNOSIS — Z886 Allergy status to analgesic agent status: Secondary | ICD-10-CM | POA: Diagnosis not present

## 2014-05-22 DIAGNOSIS — M720 Palmar fascial fibromatosis [Dupuytren]: Secondary | ICD-10-CM | POA: Diagnosis not present

## 2014-05-22 DIAGNOSIS — I252 Old myocardial infarction: Secondary | ICD-10-CM | POA: Diagnosis not present

## 2014-05-22 DIAGNOSIS — E538 Deficiency of other specified B group vitamins: Secondary | ICD-10-CM | POA: Diagnosis not present

## 2014-05-22 DIAGNOSIS — I129 Hypertensive chronic kidney disease with stage 1 through stage 4 chronic kidney disease, or unspecified chronic kidney disease: Secondary | ICD-10-CM | POA: Insufficient documentation

## 2014-05-22 DIAGNOSIS — M109 Gout, unspecified: Secondary | ICD-10-CM | POA: Insufficient documentation

## 2014-05-22 DIAGNOSIS — I480 Paroxysmal atrial fibrillation: Secondary | ICD-10-CM | POA: Insufficient documentation

## 2014-05-22 DIAGNOSIS — Z950 Presence of cardiac pacemaker: Secondary | ICD-10-CM | POA: Diagnosis not present

## 2014-05-22 DIAGNOSIS — I495 Sick sinus syndrome: Secondary | ICD-10-CM | POA: Insufficient documentation

## 2014-05-22 DIAGNOSIS — K449 Diaphragmatic hernia without obstruction or gangrene: Secondary | ICD-10-CM | POA: Insufficient documentation

## 2014-05-22 DIAGNOSIS — J449 Chronic obstructive pulmonary disease, unspecified: Secondary | ICD-10-CM | POA: Insufficient documentation

## 2014-05-22 DIAGNOSIS — Z7901 Long term (current) use of anticoagulants: Secondary | ICD-10-CM | POA: Insufficient documentation

## 2014-05-22 DIAGNOSIS — N183 Chronic kidney disease, stage 3 (moderate): Secondary | ICD-10-CM | POA: Diagnosis not present

## 2014-05-22 DIAGNOSIS — Z87891 Personal history of nicotine dependence: Secondary | ICD-10-CM | POA: Insufficient documentation

## 2014-05-22 DIAGNOSIS — Z905 Acquired absence of kidney: Secondary | ICD-10-CM | POA: Diagnosis not present

## 2014-05-22 DIAGNOSIS — H3530 Unspecified macular degeneration: Secondary | ICD-10-CM | POA: Diagnosis not present

## 2014-05-22 DIAGNOSIS — Z9049 Acquired absence of other specified parts of digestive tract: Secondary | ICD-10-CM | POA: Insufficient documentation

## 2014-05-22 DIAGNOSIS — M65331 Trigger finger, right middle finger: Secondary | ICD-10-CM | POA: Diagnosis not present

## 2014-05-22 DIAGNOSIS — M65341 Trigger finger, right ring finger: Secondary | ICD-10-CM | POA: Insufficient documentation

## 2014-05-22 DIAGNOSIS — E559 Vitamin D deficiency, unspecified: Secondary | ICD-10-CM | POA: Insufficient documentation

## 2014-05-22 DIAGNOSIS — Z96651 Presence of right artificial knee joint: Secondary | ICD-10-CM | POA: Insufficient documentation

## 2014-05-22 DIAGNOSIS — Z888 Allergy status to other drugs, medicaments and biological substances status: Secondary | ICD-10-CM | POA: Insufficient documentation

## 2014-05-22 DIAGNOSIS — Z8554 Personal history of malignant neoplasm of ureter: Secondary | ICD-10-CM | POA: Diagnosis not present

## 2014-05-22 HISTORY — PX: DUPUYTREN CONTRACTURE RELEASE: SHX1478

## 2014-05-22 HISTORY — PX: TRIGGER FINGER RELEASE: SHX641

## 2014-05-22 LAB — PROTIME-INR
INR: 1.12 (ref 0.00–1.49)
Prothrombin Time: 14.5 seconds (ref 11.6–15.2)

## 2014-05-22 LAB — APTT: aPTT: 32 seconds (ref 24–37)

## 2014-05-22 SURGERY — RELEASE, A1 PULLEY, FOR TRIGGER FINGER
Anesthesia: General | Site: Hand | Laterality: Right

## 2014-05-22 MED ORDER — GLYCOPYRROLATE 0.2 MG/ML IJ SOLN
INTRAMUSCULAR | Status: DC | PRN
  Administered 2014-05-22: 0.6 mg via INTRAVENOUS

## 2014-05-22 MED ORDER — ONDANSETRON HCL 4 MG/2ML IJ SOLN
INTRAMUSCULAR | Status: AC
  Filled 2014-05-22: qty 2

## 2014-05-22 MED ORDER — MIDAZOLAM HCL 2 MG/2ML IJ SOLN
INTRAMUSCULAR | Status: AC
  Filled 2014-05-22: qty 2

## 2014-05-22 MED ORDER — LACTATED RINGERS IV SOLN
INTRAVENOUS | Status: DC | PRN
  Administered 2014-05-22: 15:00:00 via INTRAVENOUS

## 2014-05-22 MED ORDER — LACTATED RINGERS IV SOLN: INTRAVENOUS | Status: DC

## 2014-05-22 MED ORDER — GLYCOPYRROLATE 0.2 MG/ML IJ SOLN
INTRAMUSCULAR | Status: AC
Start: 2014-05-22 — End: 2014-05-22
  Filled 2014-05-22: qty 3

## 2014-05-22 MED ORDER — PROPOFOL 10 MG/ML IV BOLUS
INTRAVENOUS | Status: AC
  Filled 2014-05-22: qty 20

## 2014-05-22 MED ORDER — SODIUM CHLORIDE 0.9 % IV SOLN
INTRAVENOUS | Status: DC
  Administered 2014-05-22 (×2): via INTRAVENOUS

## 2014-05-22 MED ORDER — MEPERIDINE HCL 25 MG/ML IJ SOLN: 6.2500 mg | INTRAMUSCULAR | Status: DC | PRN

## 2014-05-22 MED ORDER — LIDOCAINE HCL (CARDIAC) 20 MG/ML IV SOLN
INTRAVENOUS | Status: DC | PRN
  Administered 2014-05-22: 100 mg via INTRAVENOUS

## 2014-05-22 MED ORDER — MIDAZOLAM HCL 5 MG/5ML IJ SOLN
INTRAMUSCULAR | Status: DC | PRN
  Administered 2014-05-22: 2 mg via INTRAVENOUS

## 2014-05-22 MED ORDER — PROPOFOL 10 MG/ML IV BOLUS
INTRAVENOUS | Status: DC | PRN
  Administered 2014-05-22: 100 mg via INTRAVENOUS

## 2014-05-22 MED ORDER — FENTANYL CITRATE 0.05 MG/ML IJ SOLN
INTRAMUSCULAR | Status: AC
  Filled 2014-05-22: qty 2

## 2014-05-22 MED ORDER — BUPIVACAINE HCL (PF) 0.25 % IJ SOLN
INTRAMUSCULAR | Status: DC | PRN
  Administered 2014-05-22: 10 mL

## 2014-05-22 MED ORDER — PROMETHAZINE HCL 25 MG/ML IJ SOLN: 6.2500 mg | INTRAMUSCULAR | Status: DC | PRN

## 2014-05-22 MED ORDER — FENTANYL CITRATE 0.05 MG/ML IJ SOLN
INTRAMUSCULAR | Status: AC
  Filled 2014-05-22: qty 5

## 2014-05-22 MED ORDER — NEOSTIGMINE METHYLSULFATE 10 MG/10ML IV SOLN
INTRAVENOUS | Status: DC | PRN
  Administered 2014-05-22: 4 mg via INTRAVENOUS

## 2014-05-22 MED ORDER — ONDANSETRON HCL 4 MG/2ML IJ SOLN
INTRAMUSCULAR | Status: DC | PRN
  Administered 2014-05-22: 4 mg via INTRAVENOUS

## 2014-05-22 MED ORDER — FENTANYL CITRATE 0.05 MG/ML IJ SOLN
INTRAMUSCULAR | Status: DC | PRN
  Administered 2014-05-22: 100 ug via INTRAVENOUS
  Administered 2014-05-22: 50 ug via INTRAVENOUS

## 2014-05-22 MED ORDER — ROCURONIUM BROMIDE 100 MG/10ML IV SOLN
INTRAVENOUS | Status: DC | PRN
  Administered 2014-05-22: 30 mg via INTRAVENOUS

## 2014-05-22 MED ORDER — FENTANYL CITRATE 0.05 MG/ML IJ SOLN
25.0000 ug | INTRAMUSCULAR | Status: DC | PRN
  Administered 2014-05-22 (×2): 25 ug via INTRAVENOUS
  Administered 2014-05-22: 50 ug via INTRAVENOUS

## 2014-05-22 MED ORDER — BUPIVACAINE HCL (PF) 0.25 % IJ SOLN
INTRAMUSCULAR | Status: AC
  Filled 2014-05-22: qty 30

## 2014-05-22 MED ORDER — NEOSTIGMINE METHYLSULFATE 10 MG/10ML IV SOLN
INTRAVENOUS | Status: AC
  Filled 2014-05-22: qty 1

## 2014-05-22 MED ORDER — ROCURONIUM BROMIDE 50 MG/5ML IV SOLN
INTRAVENOUS | Status: AC
  Filled 2014-05-22: qty 1

## 2014-05-22 SURGICAL SUPPLY — 50 items
BANDAGE ELASTIC 3 VELCRO ST LF (GAUZE/BANDAGES/DRESSINGS) ×2 IMPLANT
BANDAGE ELASTIC 4 VELCRO ST LF (GAUZE/BANDAGES/DRESSINGS) ×2 IMPLANT
BNDG COHESIVE 1X5 TAN STRL LF (GAUZE/BANDAGES/DRESSINGS) IMPLANT
BNDG CONFORM 2 STRL LF (GAUZE/BANDAGES/DRESSINGS) IMPLANT
BNDG GAUZE ELAST 4 BULKY (GAUZE/BANDAGES/DRESSINGS) ×4 IMPLANT
CORDS BIPOLAR (ELECTRODE) ×4 IMPLANT
COVER SURGICAL LIGHT HANDLE (MISCELLANEOUS) ×4 IMPLANT
CUFF TOURNIQUET SINGLE 18IN (TOURNIQUET CUFF) ×4 IMPLANT
CUFF TOURNIQUET SINGLE 24IN (TOURNIQUET CUFF) IMPLANT
DECANTER SPIKE VIAL GLASS SM (MISCELLANEOUS) ×2 IMPLANT
DRAPE OEC MINIVIEW 54X84 (DRAPES) IMPLANT
DRAPE SURG 17X23 STRL (DRAPES) ×2 IMPLANT
GAUZE SPONGE 2X2 8PLY STRL LF (GAUZE/BANDAGES/DRESSINGS) IMPLANT
GAUZE SPONGE 4X4 12PLY STRL (GAUZE/BANDAGES/DRESSINGS) ×2 IMPLANT
GAUZE XEROFORM 1X8 LF (GAUZE/BANDAGES/DRESSINGS) ×2 IMPLANT
GLOVE BIOGEL M STRL SZ7.5 (GLOVE) ×4 IMPLANT
GLOVE SS BIOGEL STRL SZ 8 (GLOVE) ×2 IMPLANT
GLOVE SUPERSENSE BIOGEL SZ 8 (GLOVE) ×2
GOWN STRL REUS W/ TWL LRG LVL3 (GOWN DISPOSABLE) ×4 IMPLANT
GOWN STRL REUS W/ TWL XL LVL3 (GOWN DISPOSABLE) ×6 IMPLANT
GOWN STRL REUS W/TWL LRG LVL3 (GOWN DISPOSABLE) ×4
GOWN STRL REUS W/TWL XL LVL3 (GOWN DISPOSABLE) ×4
KIT BASIN OR (CUSTOM PROCEDURE TRAY) ×4 IMPLANT
KIT ROOM TURNOVER OR (KITS) ×4 IMPLANT
MANIFOLD NEPTUNE II (INSTRUMENTS) ×2 IMPLANT
NDL HYPO 25GX1X1/2 BEV (NEEDLE) IMPLANT
NEEDLE HYPO 25GX1X1/2 BEV (NEEDLE) ×4 IMPLANT
NS IRRIG 1000ML POUR BTL (IV SOLUTION) ×4 IMPLANT
PACK ORTHO EXTREMITY (CUSTOM PROCEDURE TRAY) ×4 IMPLANT
PAD ARMBOARD 7.5X6 YLW CONV (MISCELLANEOUS) ×8 IMPLANT
PAD CAST 4YDX4 CTTN HI CHSV (CAST SUPPLIES) IMPLANT
PADDING CAST COTTON 4X4 STRL (CAST SUPPLIES)
SOLUTION BETADINE 4OZ (MISCELLANEOUS) ×4 IMPLANT
SPECIMEN JAR SMALL (MISCELLANEOUS) ×4 IMPLANT
SPLINT FIBERGLASS 3X12 (CAST SUPPLIES) ×2 IMPLANT
SPONGE GAUZE 2X2 STER 10/PKG (GAUZE/BANDAGES/DRESSINGS)
SPONGE SCRUB IODOPHOR (GAUZE/BANDAGES/DRESSINGS) ×4 IMPLANT
SUCTION FRAZIER TIP 10 FR DISP (SUCTIONS) IMPLANT
SUT MERSILENE 4 0 P 3 (SUTURE) ×2 IMPLANT
SUT PROLENE 4 0 PS 2 18 (SUTURE) ×2 IMPLANT
SUT PROLENE 5 0 PS 2 (SUTURE) ×2 IMPLANT
SUT VIC AB 2-0 CT1 27 (SUTURE) ×4
SUT VIC AB 2-0 CT1 TAPERPNT 27 (SUTURE) IMPLANT
SYR CONTROL 10ML LL (SYRINGE) ×2 IMPLANT
TOWEL OR 17X24 6PK STRL BLUE (TOWEL DISPOSABLE) ×4 IMPLANT
TOWEL OR 17X26 10 PK STRL BLUE (TOWEL DISPOSABLE) ×4 IMPLANT
TUBE CONNECTING 12'X1/4 (SUCTIONS)
TUBE CONNECTING 12X1/4 (SUCTIONS) IMPLANT
UNDERPAD 30X30 INCONTINENT (UNDERPADS AND DIAPERS) ×4 IMPLANT
WATER STERILE IRR 1000ML POUR (IV SOLUTION) ×2 IMPLANT

## 2014-05-22 NOTE — Progress Notes (Signed)
Pt c/o pressure in the middle of her chest, "feels full". VSS, O 2 sat 92-94% on RA. Dr [name] updated. New order for 12 lead EKG. Will cont to monitor pt.

## 2014-05-22 NOTE — Anesthesia Postprocedure Evaluation (Signed)
  Anesthesia Post-op Note  Patient: [name]  Procedure(s) Performed: Procedure(s) (LRB): RIGHT HAND A-1 PULLEY RELEASE  (Right) DUPUYTREN RELEASE AND REPAIR AS NECESSARY RIGHT RING FINGER AND MIDDLE FINGER (Right)  Patient Location: PACU  Anesthesia Type: General  Level of Consciousness: awake and alert   Airway and Oxygen Therapy: Patient Spontanous Breathing  Post-op Pain: mild  Post-op Assessment: Post-op Vital signs reviewed, Patient's Cardiovascular Status Stable, Respiratory Function Stable, Patent Airway and No signs of Nausea or vomiting C/O mild chest pain post op. EKG normal, all vital signs stable. OK to go home  Last Vitals:  Filed Vitals:   05/22/14 1732  BP: 140/72  Pulse: 62  Temp:   Resp: 15    Post-op Vital Signs: stable   Complications: No apparent anesthesia complications

## 2014-05-22 NOTE — Progress Notes (Signed)
Pt has been asleep for the past 30 min. VSS. When awakened, says there is still "a little pressure in my chest". Feels less anxious than earlier. Dr Carolyn Stare se at bedside to speak w/pt.  OK to DC to home. Daughter on the way-fully updated.

## 2014-05-22 NOTE — Discharge Instructions (Signed)
We recommend that you to take vitamin C 1000 mg a day to promote healing we also recommend that if you require her pain medicine that he take a stool softener to prevent constipation as most pain medicines will have constipation side effects. We recommend either Peri-Colace or Senokot and recommend that you also consider adding MiraLAX to prevent the constipation affects from pain medicine if you are required to use them. These medicines are over the counter and maybe purchased at a local pharmacy. Keep bandage clean and dry.  Call for any problems.  No smoking.  Criteria for driving a car: you should be off your pain medicine for 7-8 hours, able to drive one handed(confident), thinking clearly and feeling able in your judgement to drive. Continue elevation as it will decrease swelling.  If instructed by MD move your fingers within the confines of the bandage/splint.  Use ice if instructed by your MD. Call immediately for any sudden loss of feeling in your hand/arm or change in functional abilities of the extremity.

## 2014-05-22 NOTE — Anesthesia Procedure Notes (Signed)
Procedure Name: Intubation Date/Time: 05/22/2014 2:41 PM Performed by: Jacory Kamel, Sheron Nightingale Pre-anesthesia Checklist: Patient identified, Timeout performed, Emergency Drugs available, Suction available and Patient being monitored Patient Re-evaluated:Patient Re-evaluated prior to inductionPreoxygenation: Pre-oxygenation with 100% oxygen Intubation Type: IV induction Ventilation: Mask ventilation without difficulty Laryngoscope Size: Mac and 3 Grade View: Grade I Tube type: Oral Number of attempts: 1 Secured at: 22 cm Dental Injury: Teeth and Oropharynx as per pre-operative assessment

## 2014-05-22 NOTE — Op Note (Signed)
See [id number] Amedeo Plenty MD

## 2014-05-22 NOTE — H&P (Signed)
[name] is an 81 y.o. female.   Chief Complaint:pain Rt ring finger HPI: Patient presents for evaluation and treatment of the of their upper extremity predicament. The patient denies neck back chest or of abdominal pain. The patient notes that they have no lower extremity problems. The patient from primarily complains of the upper extremity pain noted.  Past Medical History  Diagnosis Date  . Candidiasis of the esophagus 11/26/2007  . Myocardial infarct, old   . COPD, severe   . Sick sinus syndrome with tachycardia   . Pacemaker   . Chronic kidney disease (CKD), stage III (moderate)   . Hypertension   . Acute appendicitis with rupture   . Spinal stenosis, lumbar   . Adrenal adenoma     Incidentaloma  . Spondylolisthesis of lumbar region   . Lumbar herniated disc     History of HNP L4/5 in 2003  . Hx of colonoscopy with polypectomy 04/27/2010    Dr Cristina Gong found three  tubular adenomas each less than 10 mm size  . Retinal hemorrhage of left eye 06/2010  . Cataract 2013    Bilateral   . Abnormal mammogram, unspecified 08/23/2010    Followup imaging reassuring.  Repeat in 6 months.   Marland Kitchen DISC WITH RADICULOPATHY 04/27/2006    Qualifier: Diagnosis of  By: McDiarmid MD, Sherren Mocha    . Transitional cell carcinoma of ureter, history   . Mild cognitive impairment 10/26/2012    (10/25/12) Failed MiniCog screen  . 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 Normal uric acid serum level at time of attack.    Marland Kitchen COPD (chronic obstructive pulmonary disease)   . EDEMA-LEGS,DUE TO VENOUS OBSTRUCT. 04/27/2006    Qualifier: Diagnosis of  By: McDiarmid MD, Sherren Mocha    . ANXIETY 04/27/2006    Qualifier: Diagnosis of  By: McDiarmid MD, Sherren Mocha    . HERNIA, HIATAL, NONCONGENITAL 04/27/2006    Qualifier: Diagnosis of  By: McDiarmid MD, Sherren Mocha    . History of Hemorrhoids 04/27/2006    Qualifier: Diagnosis of  By: McDiarmid MD, Sherren Mocha    . RHINITIS,  ALLERGIC 04/27/2006    Qualifier: Diagnosis of  By: McDiarmid MD, Sherren Mocha    . SCHATZKI'S RING, HX OF 11/26/2007    Qualifier: Diagnosis of  By: McDiarmid MD, Sherren Mocha  An EGD was performed by Dr Cristina Gong on 04/27/2010 for iron deficiency anemia. There was a a transient hiatal hernia with Schatzki's ring. Stomach and duodenum were normal. EGD on 03/06/12 by Dr Cristina Gong for IDA non-obstructing Schatzki's ring at Gastroesophageal junction, otherwise normal esophagus and stomach.    . Urge incontinence 12/13/2011    Diagnosed in 10/2011 by Dr Bjorn Loser (Urology)   . VITAMIN B12 DEFICIENCY 10/07/2009    Qualifier: Diagnosis of  By: McDiarmid MD, Sherren Mocha  Dx based on a post-TKR anemia work-up Low normal serum B12 with high Methylmalonic acid and homocysteine level  Vit B12 serum level (10/28/10) > 1500 pg/mL   . Vitamin D deficiency 11/02/2010    Serum vitamin D 25(OH) = 10.9 ng/mL (30 -100) on 10/28/10 c/w Vitamin D deficiency.     . AF (paroxysmal atrial fibrillation) 11/11/2010    Hospitalization (9/8-9/10, Dr Daneen Schick, III, Cardiology) for Paroxysmal Atrial Fibrillation with RVR and anginal pain secondary to demand/supply mismatch in setting of RVR with known circumflex artery branch disease.    Marland Kitchen HYPERTENSION, BENIGN ESSENTIAL 06/18/2007  Qualifier: Diagnosis of  By: McDiarmid MD, Sherren Mocha    . Iron deficiency anemia 08/05/2010    Dr Cristina Gong (GI) has evaluated with EGD, colonoscopy, and video capsular endoscopy in 2011 & 2012.  All have been unrevealing as to an origin of IDA.  OV with Dr Cristina Gong (10/28/10) assessment of blood in stool per hemoccult and GER. Hbg 12.1 g/dL, MCV 91.8, Ferritin 30 ng/mL. Patient taking on ferrous sulfate tab daily.   EGD on 03/06/12 by Dr Cristina Gong for IDA non-obstructing Schatzki's ring at Gastroesophageal junction, otherwise normal esophagus and stomach.     . Macular degeneration, bilateral 10/04/2010    Right eye is wet MD, the other is dry macular degeneration (ARMD). Pt undergoing some  form of vascular endothelial growth factor inhibition intraocular therapy.    . VENTRICULAR HYPERTROPHY, LEFT 08/28/2008    Qualifier: Diagnosis of  By: McDiarmid MD, Sherren Mocha    . CHRONIC KIDNEY DISEASE STAGE III (MODERATE) 09/02/2009    Patient with Solitary Kidney S/P total Nephrectomy for transitional cell caner.    Marland Kitchen COPD 04/27/2006    Qualifier: Diagnosis of  By: McDiarmid MD, Sherren Mocha    . MYOCARDIAL INFARCTION, HX OF 09/25/2008    Qualifier: Diagnosis of  By: McDiarmid MD, Todd  Hospitalized for NSTEMI ( 10/02/2008). PCI (10/02/2008, Dr Daneen Schick) with RCA [place] placed: 99% lesion to 0% lesion. Circumflex Obtuse marginal with 90% stenosis (left for medical mangement for now)   Hospitalization (9/8-9/10, Dr Daneen Schick, III, Cardiology) for Paroxysmal Atrial Fibrillation with RVR and anginal pain secondary to demand/supply mismatch in setting of RVR with known circumflex artery branch disease.    Marland Kitchen SICK SINUS SYNDROME 04/27/2006    Qualifier: Diagnosis of  By: McDiarmid MD, Sherren Mocha  S/P dual chamber pacemaker placement by Dr Osie Cheeks ([facility]) with pacemaker lead extraction & reinsertion - 07/25/2003  Hospitalization (9/8-9/10, Dr Daneen Schick, III, Cardiology) for Paroxysmal Atrial Fibrillation with RVR and anginal pain secondary to demand/supply mismatch in setting of RVR with known circumflex artery branch disease.    . Solitary kidney, acquired 05/17/2010    Surgical removal for transitional cell cancer by Tresa Endo, MD ([facility]). Surveillance cystoscopy by Dr Alinda [facility]) on 10/19/12 without evidence of cystoscopic recurrence. Recommend RTC one year for cystoscopy.   . ADENOMATOUS COLONIC POLYP 03/01/2003    Qualifier: Diagnosis of  By: McDiarmid MD, Sherren Mocha Multiple benign polyps of cecum, ascending, transverse and sigmoid colon by 8/09 colonoscopy by Dr Cristina Gong  Colonoscopy by Dr Cristina Gong for iron-deficiency anemia on 04/27/2010 showed three sessile polyps that were in ascending (3 mm x 9  mm), transverse (4 mm), and cecum (3 mm).  All three were tubular adenomas that were negative for high grade dysplasia or malignancy on pathology. Dr Cristina Gong called the polpys benign and not requiring follow-up in view of the patients age.    . Soft tissue injury of foot 05/03/2011  . PREDIABETES 09/11/2007    Qualifier: Diagnosis of  By: McDiarmid MD, Sherren Mocha    . Numbness and tingling in hands 07/21/2011  . MUSCLE CRAMPS 03/11/2010    Qualifier: Diagnosis of  By: McDiarmid MD, Sherren Mocha    . Mammogram abnormal 02/15/2011  . LUMBAR SPINAL STENOSIS 04/27/2006    Qualifier: Diagnosis of  By: McDiarmid MD, Francisca December HNP w/ L4&5 root encroachment - 05/24/2001,   Spinal Stenosis: L-S MRI: L4-5 Spinal stenosis, - 03/03/2006, Spondylolithesis L5-S1(mild), DJD L4-5 on X-Ray 11/04   . Leg cramps 05/29/2012  .  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 Normal uric acid serum level at time of attack.    . Cellulitis of leg, right 10/01/2012  . Solar lentigo 06/15/2012  . Muscle spasm of back 09/24/2013  . 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  No mass lesion seen. Likely cause of patients 2 to 3 episodes of bleeding from patient's right eye.      Past Surgical History  Procedure Laterality Date  . Pacemaker insertion      Dr Lovena Le ([facility])  . Nephrectomy  For transition cell cancer     Dr Tresa Endo, surgeon  . Replacement total knee  2009, Right knee    Dr Wynelle Link  . Replacement total knee  05/2009, Left knee    Dr Wynelle Link  . Knee arthroscopy w/ synovectomy  11/2009, left knee    Dr Wynelle Link  . Cholecystectomy    . Esophagogastroduodenoscopy  04/27/2010    Dr Cristina Gong - found transient H/H & Schatzki's ring  .  Esophagogastroduodenoscopy endoscopy  03/06/2012    Dr Cristina Gong - found non-obstuctive Schatzki's ring at [employer] o/w normal EGD.   Marland Kitchen Pacemaker generator change N/A 11/12/2012    Procedure: PACEMAKER GENERATOR CHANGE;  Surgeon: Evans Lance, MD;  Location: [facility] CATH LAB;  Service: Cardiovascular;  Laterality: N/A;  . Tonsillectomy    . Breast surgery      breast reduction  . Eye surgery      bilateral cataracts    History reviewed. No pertinent family history. Social History:  reports that she has quit smoking. Her smoking use included Cigarettes. She has never used smokeless tobacco. She reports that she drinks about 12.0 oz of alcohol per week. She reports that she does not use illicit drugs.  Allergies:  Allergies  Allergen Reactions  . Baclofen Other (See Comments)    Confusion occurred with taking 3 at the same time.  . Codeine Phosphate Nausea Only  . Hydrochlorothiazide Other (See Comments)    Possible acute gouty arthritis of wrist  . Montelukast Sodium Other (See Comments)    Unspecified reaction.     Medications Prior to Admission  Medication Sig Dispense Refill  . ADVAIR DISKUS 250-50 MCG/DOSE AEPB Inhale 2 puffs into the lungs 2 (two) times daily.     Marland Kitchen albuterol (VENTOLIN HFA) 108 (90 BASE) MCG/ACT inhaler Inhale 2 puffs into the lungs every 6 (six) hours as needed. If needed for shortness of breath. 1 Inhaler 3  . B Complex Vitamins (VITAMIN B COMPLEX PO) Take 1 tablet by mouth daily.    . cholecalciferol (VITAMIN D) 1000 UNITS tablet Take 1,000 Units by mouth daily.    . furosemide (LASIX) 40 MG tablet Take 40 mg by mouth as directed. 1 tablet once a week    . LORazepam (ATIVAN) 0.5 MG tablet TAKE 1 TABLET BY MOUTH EVERY 8 HOURS AS NEEDED FOR ANXIETY 30 tablet 1  . metoprolol succinate (TOPROL-XL) 50 MG 24 hr tablet TAKE 1 TABLET BY MOUTH EVERY DAY WITH FOOD 90 tablet 3  . NITROSTAT 0.4 MG SL tablet PLACE ONE TABLET UNDER THE TONGUE EVERY FIVE MINUTES AS NEEDED. 25  tablet 3  . omeprazole (PRILOSEC) 40 MG capsule TAKE 1  CAPSULE EVERY DAY. 90 capsule 2  . simvastatin (ZOCOR) 40 MG tablet TAKE 1 TABLET BY MOUTH EVERY MORNING 90 tablet 3  . tiotropium (SPIRIVA) 18 MCG inhalation capsule Place 1 capsule (18 mcg total) into inhaler and inhale daily. 30 capsule PRN  . traMADol (ULTRAM) 50 MG tablet Take 1 tablet (50 mg total) by mouth every 6 (six) hours as needed for moderate pain. 30 tablet 1  . vitamin B-12 (CYANOCOBALAMIN) 1000 MCG tablet Take 1,000 mcg by mouth daily.     Marland Kitchen warfarin (COUMADIN) 1 MG tablet TAKE TABLETS BY MOUTH AS DIRECTED BY PHYSICIAN. (Patient taking differently: TAKE 3MG  ON MONDAY, WEDNESDAY AND FRIDAYS.  TAKE 2MG  ALL OTHER DAYS OF THE WEEK.) 270 tablet 0  . albuterol (PROVENTIL) (2.5 MG/3ML) 0.083% nebulizer solution Take 3 mLs (2.5 mg total) by nebulization every 6 (six) hours as needed for wheezing or shortness of breath. 75 mL 4    Results for orders placed or performed during the hospital encounter of 05/22/14 (from the past 48 hour(s))  Protime-INR     Status: None   Collection Time: 05/22/14 12:20 PM  Result Value Ref Range   Prothrombin Time 14.5 11.6 - 15.2 seconds   INR 1.12 0.00 - 1.49  APTT     Status: None   Collection Time: 05/22/14 12:20 PM  Result Value Ref Range   aPTT 32 24 - 37 seconds   No results found.  Review of Systems  Constitutional: Negative.   Cardiovascular: Negative.   Neurological: Negative.   Psychiatric/Behavioral: Negative.     Blood pressure 114/57, pulse 73, temperature 97.1 F (36.2 C), temperature source Oral, resp. rate 18, height 5\' 4"  (1.626 m), weight 73.755 kg (162 lb 9.6 oz), SpO2 96 %. Physical Exam R RF Duput Ctx and A1 pulley tenosynovitis The patient is alert and oriented in no acute distress the patient complains of pain in the affected upper extremity.  The patient is noted to have a normal HEENT exam.  Lung fields show equal chest expansion and no shortness of breath   abdomen exam is nontender without distention.  Lower extremity examination does not show any fracture dislocation or blood clot symptoms.  Pelvis is stable neck and back are stable and nontender Assessment/Plan Plan right ring finger STPF and A1 pulley release We are planning surgery for your upper extremity. The risk and benefits of surgery include risk of bleeding infection anesthesia damage to normal structures and failure of the surgery to accomplish its intended goals of relieving symptoms and restoring function with this in mind we'll going to proceed. I have specifically discussed with the patient the pre-and postoperative regime and the does and don'ts and risk and benefits in great detail. Risk and benefits of surgery also include risk of dystrophy chronic nerve pain failure of the healing process to go onto completion and other inherent risks of surgery The relavent the pathophysiology of the disease/injury process, as well as the alternatives for treatment and postoperative course of action has been discussed in great detail with the patient who desires to proceed.  We will do everything in our power to help you (the patient) restore function to the upper extremity. Is a pleasure to see this patient today.  Paulene Floor 05/22/2014, 1:48 PM

## 2014-05-23 NOTE — Op Note (Signed)
NAMEJEREMIE, [name]                   ACCOUNT NO.:  [id number]  MEDICAL RECORD NO.:  [id number]  LOCATION:  [facility]                         FACILITY:  [facility]  PHYSICIAN:  Satira Anis. Jalyric Kaestner, M.D.DATE OF BIRTH:  1923/03/02  DATE OF PROCEDURE:  05/22/2014 DATE OF DISCHARGE:  05/22/2014                              OPERATIVE REPORT   PREOPERATIVE DIAGNOSIS:  Dupuytren contracture, right hand, with A1 pulley chronic stenosing tenosynovitis.  POSTOPERATIVE DIAGNOSIS:  Dupuytren contracture, right hand, with A1 pulley chronic stenosing tenosynovitis.  PROCEDURE: 1. Subtotal palmar fasciectomy, right ring finger and palm. 2. A1 pulley release, right ring finger. 3. Flexor tenolysis, tenosynovectomy FDP and FDS tendons (flexor     digitorum profundus and flexor digitorum superficialis) and right     ring finger.  SURGEON:  Satira Anis. Amedeo Plenty, M.D.  ASSISTANT:  None.  COMPLICATIONS:  None.  ANESTHESIA:  General.  TOURNIQUET TIME:  Less than 30 minutes.  INDICATIONS FOR PROCEDURE:  This 81 year old female with the above- mentioned diagnosis.  She desires to proceed with the above-mentioned operative intervention.  OPERATION IN DETAIL:  The patient was seen by myself and Anesthesia, taken to the operative suite, underwent smooth induction of general anesthesia.  Preoperative Ancef was given.  Time-out was called.  She was prepped and draped in usual sterile fashion.  Betadine scrub and paint.  Arm was elevated, tourniquet insufflated to 250 mmHg. Dissection was carried down and curvilinear modified Brunner incision was made.  Skin flaps were elevated.  Neurovascular bundles were carefully protected and identified, and a subtotal palmar fasciectomy, and pretendinous cord to the ring finger and palm was removed without difficulty.  Following this, I performed a careful dissection and A1 pulley released under direct vision, alleviating the preoperative locking and catching.  Following  this, given the dense amount of tenosynovitis, I performed tenolysis, tenosynovectomy of the FDP and FDS tendons.  The neurovascular bundles were carefully protected and preserved for the entire case under 4.0 loupe magnification.  There were no complicating features.  Following this, the patient was irrigated, tourniquet was insufflated, and full passive range of motion was demonstrated without difficulty.  I was quite pleased with this.  She was sutured with 5-0 Prolene, taken to the PACU after extubation, and looked quite well.  She is to resume all of her regular home medicines and return to see me in 6 days in the office.  These notes have been discussed and all questions have been encouraged and answered.     Satira Anis. Amedeo Plenty, M.D.     [facility]  D:  05/22/2014  T:  05/23/2014  Job:  [id number]

## 2014-05-26 ENCOUNTER — Encounter (HOSPITAL_COMMUNITY): Payer: Self-pay | Admitting: Orthopedic Surgery

## 2014-05-26 NOTE — Transfer of Care (Signed)
Immediate Anesthesia Transfer of Care Note  Patient: [name]  Procedure(s) Performed: Procedure(s): RIGHT HAND A-1 PULLEY RELEASE  (Right) DUPUYTREN RELEASE AND REPAIR AS NECESSARY RIGHT RING FINGER AND MIDDLE FINGER (Right)  Patient Location: PACU  Anesthesia Type:General  Level of Consciousness: awake  Airway & Oxygen Therapy: Patient Spontanous Breathing and Patient connected to nasal cannula oxygen  Post-op Assessment: Report given to RN and Post -op Vital signs reviewed and stable  Post vital signs: Reviewed and stable  Last Vitals:  Filed Vitals:   05/22/14 1755  BP:   Pulse: 62  Temp: 36.4 C  Resp: 17    Complications: No apparent anesthesia complications

## 2014-05-28 DIAGNOSIS — Z4789 Encounter for other orthopedic aftercare: Secondary | ICD-10-CM | POA: Diagnosis not present

## 2014-05-28 DIAGNOSIS — M65341 Trigger finger, right ring finger: Secondary | ICD-10-CM | POA: Diagnosis not present

## 2014-05-29 ENCOUNTER — Encounter (HOSPITAL_COMMUNITY): Payer: Self-pay | Admitting: Orthopedic Surgery

## 2014-05-29 DIAGNOSIS — H3532 Exudative age-related macular degeneration: Secondary | ICD-10-CM | POA: Diagnosis not present

## 2014-05-30 ENCOUNTER — Encounter (HOSPITAL_COMMUNITY): Payer: Self-pay | Admitting: *Deleted

## 2014-05-30 ENCOUNTER — Emergency Department (HOSPITAL_COMMUNITY): Payer: Medicare Other

## 2014-05-30 ENCOUNTER — Inpatient Hospital Stay (HOSPITAL_COMMUNITY)
Admission: EM | Admit: 2014-05-30 | Discharge: 2014-06-01 | DRG: 310 | Disposition: A | Discharge status: Home / Self Care | Payer: Medicare Other | Attending: Cardiovascular Disease | Admitting: Cardiovascular Disease

## 2014-05-30 ENCOUNTER — Encounter (HOSPITAL_BASED_OUTPATIENT_CLINIC_OR_DEPARTMENT_OTHER): Payer: Medicare Other

## 2014-05-30 DIAGNOSIS — Z888 Allergy status to other drugs, medicaments and biological substances status: Secondary | ICD-10-CM

## 2014-05-30 DIAGNOSIS — Z66 Do not resuscitate: Secondary | ICD-10-CM | POA: Diagnosis present

## 2014-05-30 DIAGNOSIS — D509 Iron deficiency anemia, unspecified: Secondary | ICD-10-CM | POA: Diagnosis present

## 2014-05-30 DIAGNOSIS — E559 Vitamin D deficiency, unspecified: Secondary | ICD-10-CM | POA: Diagnosis present

## 2014-05-30 DIAGNOSIS — M109 Gout, unspecified: Secondary | ICD-10-CM | POA: Diagnosis present

## 2014-05-30 DIAGNOSIS — I2510 Atherosclerotic heart disease of native coronary artery without angina pectoris: Secondary | ICD-10-CM | POA: Diagnosis present

## 2014-05-30 DIAGNOSIS — J449 Chronic obstructive pulmonary disease, unspecified: Secondary | ICD-10-CM | POA: Diagnosis present

## 2014-05-30 DIAGNOSIS — Z86010 Personal history of colonic polyps: Secondary | ICD-10-CM

## 2014-05-30 DIAGNOSIS — R0602 Shortness of breath: Secondary | ICD-10-CM | POA: Diagnosis not present

## 2014-05-30 DIAGNOSIS — F419 Anxiety disorder, unspecified: Secondary | ICD-10-CM | POA: Diagnosis present

## 2014-05-30 DIAGNOSIS — Z96659 Presence of unspecified artificial knee joint: Secondary | ICD-10-CM | POA: Diagnosis present

## 2014-05-30 DIAGNOSIS — E538 Deficiency of other specified B group vitamins: Secondary | ICD-10-CM | POA: Diagnosis present

## 2014-05-30 DIAGNOSIS — I4891 Unspecified atrial fibrillation: Secondary | ICD-10-CM

## 2014-05-30 DIAGNOSIS — I252 Old myocardial infarction: Secondary | ICD-10-CM | POA: Diagnosis not present

## 2014-05-30 DIAGNOSIS — E876 Hypokalemia: Secondary | ICD-10-CM | POA: Diagnosis present

## 2014-05-30 DIAGNOSIS — R079 Chest pain, unspecified: Secondary | ICD-10-CM

## 2014-05-30 DIAGNOSIS — I480 Paroxysmal atrial fibrillation: Principal | ICD-10-CM | POA: Diagnosis present

## 2014-05-30 DIAGNOSIS — N183 Chronic kidney disease, stage 3 (moderate): Secondary | ICD-10-CM | POA: Diagnosis not present

## 2014-05-30 DIAGNOSIS — Z950 Presence of cardiac pacemaker: Secondary | ICD-10-CM | POA: Diagnosis present

## 2014-05-30 DIAGNOSIS — I129 Hypertensive chronic kidney disease with stage 1 through stage 4 chronic kidney disease, or unspecified chronic kidney disease: Secondary | ICD-10-CM | POA: Diagnosis present

## 2014-05-30 DIAGNOSIS — H3530 Unspecified macular degeneration: Secondary | ICD-10-CM | POA: Diagnosis present

## 2014-05-30 DIAGNOSIS — Z87891 Personal history of nicotine dependence: Secondary | ICD-10-CM

## 2014-05-30 DIAGNOSIS — Z7901 Long term (current) use of anticoagulants: Secondary | ICD-10-CM

## 2014-05-30 DIAGNOSIS — I10 Essential (primary) hypertension: Secondary | ICD-10-CM | POA: Diagnosis present

## 2014-05-30 DIAGNOSIS — M1990 Unspecified osteoarthritis, unspecified site: Secondary | ICD-10-CM | POA: Diagnosis present

## 2014-05-30 DIAGNOSIS — I4819 Other persistent atrial fibrillation: Secondary | ICD-10-CM | POA: Diagnosis present

## 2014-05-30 DIAGNOSIS — Z9842 Cataract extraction status, left eye: Secondary | ICD-10-CM

## 2014-05-30 DIAGNOSIS — Z885 Allergy status to narcotic agent status: Secondary | ICD-10-CM

## 2014-05-30 DIAGNOSIS — Z9841 Cataract extraction status, right eye: Secondary | ICD-10-CM

## 2014-05-30 LAB — I-STAT TROPONIN, ED: TROPONIN I, POC: 0.01 ng/mL (ref 0.00–0.08)

## 2014-05-30 MED ORDER — SODIUM CHLORIDE 0.9 % IV BOLUS (SEPSIS)
500.0000 mL | Freq: Once | INTRAVENOUS | Status: AC
  Administered 2014-05-30: 500 mL via INTRAVENOUS

## 2014-05-30 MED ORDER — ASPIRIN 81 MG PO CHEW
324.0000 mg | CHEWABLE_TABLET | Freq: Once | ORAL | Status: AC
  Administered 2014-05-30: 324 mg via ORAL
  Filled 2014-05-30: qty 4

## 2014-05-30 NOTE — ED Notes (Signed)
Pt c/o chest tightness starting tonight while at home. Pt has not been taking her Coumadin for 10 days due to surgery. Hx of afib; has a pacemaker to r side of chest. EKG shows a-fib, no pacer spikes noted. Pt also reports increased shortness of breath and nausea. Pt took 2 nitro at home prior to EMS with some relief of chest tightness.

## 2014-05-30 NOTE — ED Notes (Signed)
Dr. Horton at bedside. 

## 2014-05-30 NOTE — ED Notes (Signed)
Pt to ED from home via [facility] c/o a-fib. Pt has been off of Coumadin x 10 days for a hand surgery. Pt called EMS for chest tightness. HR 140-160 iin a-fib. Pt had taken two nitroglycerin prior to EMS. Initial bp [id number] systolic. EMS gave 249ml bolus, bp improved. HR 132 in afib on arrival

## 2014-05-30 NOTE — ED Provider Notes (Signed)
CSN: [id number]     Arrival date & time 05/30/14  2307 History   First MD Initiated Contact with Patient 05/30/14 2309     This chart was scribed for Tammy Commons, MD by Forrestine Him, ED Scribe. This patient was seen in room D30C/D30C and the patient's care was started 11:42 PM.   Chief Complaint  Patient presents with  . Atrial Fibrillation   HPI  HPI Comments: [name] is a 81 y.o. female with a PMHx of COPD, MI, CKD, HTN, sick sinus syndrome, macular degeneration, sinal stenosis, and anxiety who presents to the Emergency Department here for suspected atrial fibrillation this evening. Pt reports constant, ongoing chest pain onset 10:15 PM this evening. Pt states pain felt similar to previous heart attack. However, currently pt is pain free. Two doses of Nitro and two doses of Lorazepam taken prior to visit to department without any improvement. No full dose of Aspirin taken prior to arrival. No SOB, lightheadedness, or lower leg swelling. [name] admits to many recent life stressors including progression of ALS-son in law.     Chart review reveals that patient is in normal sinus rhythm 99% the time. She has not been taking her Coumadin following her surgery. She feels like her heart went into atrial fibrillation tonight.  Past Medical History  Diagnosis Date  . Candidiasis of the esophagus 11/26/2007  . Myocardial infarct, old   . COPD, severe   . Sick sinus syndrome with tachycardia   . Pacemaker   . Chronic kidney disease (CKD), stage III (moderate)   . Hypertension   . Acute appendicitis with rupture   . Spinal stenosis, lumbar   . Adrenal adenoma     Incidentaloma  . Spondylolisthesis of lumbar region   . Lumbar herniated disc     History of HNP L4/5 in 2003  . Hx of colonoscopy with polypectomy 04/27/2010    Dr Cristina Gong found three  tubular adenomas each less than 10 mm size  . Retinal hemorrhage of left eye 06/2010  . Cataract 2013    Bilateral   . Abnormal  mammogram, unspecified 08/23/2010    Followup imaging reassuring.  Repeat in 6 months.   Marland Kitchen DISC WITH RADICULOPATHY 04/27/2006    Qualifier: Diagnosis of  By: McDiarmid MD, Sherren Mocha    . Transitional cell carcinoma of ureter, history   . Mild cognitive impairment 10/26/2012    (10/25/12) Failed MiniCog screen  . 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 Normal uric acid serum level at time of attack.    Marland Kitchen COPD (chronic obstructive pulmonary disease)   . EDEMA-LEGS,DUE TO VENOUS OBSTRUCT. 04/27/2006    Qualifier: Diagnosis of  By: McDiarmid MD, Sherren Mocha    . ANXIETY 04/27/2006    Qualifier: Diagnosis of  By: McDiarmid MD, Sherren Mocha    . HERNIA, HIATAL, NONCONGENITAL 04/27/2006    Qualifier: Diagnosis of  By: McDiarmid MD, Sherren Mocha    . History of Hemorrhoids 04/27/2006    Qualifier: Diagnosis of  By: McDiarmid MD, Sherren Mocha    . RHINITIS, ALLERGIC 04/27/2006    Qualifier: Diagnosis of  By: McDiarmid MD, Sherren Mocha    . SCHATZKI'S RING, HX OF 11/26/2007    Qualifier: Diagnosis of  By: McDiarmid MD, Sherren Mocha  An EGD was performed by Dr Cristina Gong on 04/27/2010 for iron deficiency anemia. There was a a transient hiatal hernia with Schatzki's ring. Stomach  and duodenum were normal. EGD on 03/06/12 by Dr Cristina Gong for IDA non-obstructing Schatzki's ring at Gastroesophageal junction, otherwise normal esophagus and stomach.    . Urge incontinence 12/13/2011    Diagnosed in 10/2011 by Dr Bjorn Loser (Urology)   . VITAMIN B12 DEFICIENCY 10/07/2009    Qualifier: Diagnosis of  By: McDiarmid MD, Sherren Mocha  Dx based on a post-TKR anemia work-up Low normal serum B12 with high Methylmalonic acid and homocysteine level  Vit B12 serum level (10/28/10) > 1500 pg/mL   . Vitamin D deficiency 11/02/2010    Serum vitamin D 25(OH) = 10.9 ng/mL (30 -100) on 10/28/10 c/w Vitamin D deficiency.     . AF (paroxysmal atrial fibrillation) 11/11/2010    Hospitalization (9/8-9/10, Dr Daneen Schick,  III, Cardiology) for Paroxysmal Atrial Fibrillation with RVR and anginal pain secondary to demand/supply mismatch in setting of RVR with known circumflex artery branch disease.    Marland Kitchen HYPERTENSION, BENIGN ESSENTIAL 06/18/2007    Qualifier: Diagnosis of  By: McDiarmid MD, Sherren Mocha    . Iron deficiency anemia 08/05/2010    Dr Cristina Gong (GI) has evaluated with EGD, colonoscopy, and video capsular endoscopy in 2011 & 2012.  All have been unrevealing as to an origin of IDA.  OV with Dr Cristina Gong (10/28/10) assessment of blood in stool per hemoccult and GER. Hbg 12.1 g/dL, MCV 91.8, Ferritin 30 ng/mL. Patient taking on ferrous sulfate tab daily.   EGD on 03/06/12 by Dr Cristina Gong for IDA non-obstructing Schatzki's ring at Gastroesophageal junction, otherwise normal esophagus and stomach.     . Macular degeneration, bilateral 10/04/2010    Right eye is wet MD, the other is dry macular degeneration (ARMD). Pt undergoing some form of vascular endothelial growth factor inhibition intraocular therapy.    . VENTRICULAR HYPERTROPHY, LEFT 08/28/2008    Qualifier: Diagnosis of  By: McDiarmid MD, Sherren Mocha    . CHRONIC KIDNEY DISEASE STAGE III (MODERATE) 09/02/2009    Patient with Solitary Kidney S/P total Nephrectomy for transitional cell caner.    Marland Kitchen COPD 04/27/2006    Qualifier: Diagnosis of  By: McDiarmid MD, Sherren Mocha    . MYOCARDIAL INFARCTION, HX OF 09/25/2008    Qualifier: Diagnosis of  By: McDiarmid MD, Todd  Hospitalized for NSTEMI ( 10/02/2008). PCI (10/02/2008, Dr Daneen Schick) with RCA Neeses placed: 99% lesion to 0% lesion. Circumflex Obtuse marginal with 90% stenosis (left for medical mangement for now)   Hospitalization (9/8-9/10, Dr Daneen Schick, III, Cardiology) for Paroxysmal Atrial Fibrillation with RVR and anginal pain secondary to demand/supply mismatch in setting of RVR with known circumflex artery branch disease.    Marland Kitchen SICK SINUS SYNDROME 04/27/2006    Qualifier: Diagnosis of  By: McDiarmid MD, Sherren Mocha  S/P dual chamber pacemaker  placement by Dr Osie Cheeks ([facility]) with pacemaker lead extraction & reinsertion - 07/25/2003  Hospitalization (9/8-9/10, Dr Daneen Schick, III, Cardiology) for Paroxysmal Atrial Fibrillation with RVR and anginal pain secondary to demand/supply mismatch in setting of RVR with known circumflex artery branch disease.    . Solitary kidney, acquired 05/17/2010    Surgical removal for transitional cell cancer by Tresa Endo, MD ([facility]). Surveillance cystoscopy by Dr Alinda Money [facility]) on 10/19/12 without evidence of cystoscopic recurrence. Recommend RTC one year for cystoscopy.   . ADENOMATOUS COLONIC POLYP 03/01/2003    Qualifier: Diagnosis of  By: McDiarmid MD, Sherren Mocha Multiple benign polyps of cecum, ascending, transverse and sigmoid colon by 8/09 colonoscopy by Dr Cristina Gong  Colonoscopy by Dr Cristina Gong for iron-deficiency  anemia on 04/27/2010 showed three sessile polyps that were in ascending (3 mm x 9 mm), transverse (4 mm), and cecum (3 mm).  All three were tubular adenomas that were negative for high grade dysplasia or malignancy on pathology. Dr Cristina Gong called the polpys benign and not requiring follow-up in view of the patients age.    . Soft tissue injury of foot 05/03/2011  . PREDIABETES 09/11/2007    Qualifier: Diagnosis of  By: McDiarmid MD, Sherren Mocha    . Numbness and tingling in hands 07/21/2011  . MUSCLE CRAMPS 03/11/2010    Qualifier: Diagnosis of  By: McDiarmid MD, Sherren Mocha    . Mammogram abnormal 02/15/2011  . LUMBAR SPINAL STENOSIS 04/27/2006    Qualifier: Diagnosis of  By: McDiarmid MD, Francisca December HNP w/ L4&5 root encroachment - 05/24/2001,   Spinal Stenosis: L-S MRI: L4-5 Spinal stenosis, - 03/03/2006, Spondylolithesis L5-S1(mild), DJD L4-5 on X-Ray 11/04   . Leg cramps 05/29/2012  . 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 Normal uric acid serum level at time of attack.    . Cellulitis of leg, right 10/01/2012  . Solar  lentigo 06/15/2012  . Muscle spasm of back 09/24/2013  . 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  No mass lesion seen. Likely cause of patients 2 to 3 episodes of bleeding from patient's right eye.     Past Surgical History  Procedure Laterality Date  . Pacemaker insertion      Dr Lovena Le ([facility])  . Nephrectomy  For transition cell cancer     Dr Tresa Endo, surgeon  . Replacement total knee  2009, Right knee    Dr Wynelle Link  . Replacement total knee  05/2009, Left knee    Dr Wynelle Link  . Knee arthroscopy w/ synovectomy  11/2009, left knee    Dr Wynelle Link  . Cholecystectomy    . Esophagogastroduodenoscopy  04/27/2010    Dr Cristina Gong - found transient H/H & Schatzki's ring  . Esophagogastroduodenoscopy endoscopy  03/06/2012    Dr Cristina Gong - found non-obstuctive Schatzki's ring at [employer] [facility]. o/w normal EGD.   Marland Kitchen Pacemaker generator change N/A 11/12/2012    Procedure: PACEMAKER GENERATOR CHANGE;  Surgeon: Evans Lance, MD;  Location: [facility] CATH LAB;  Service: Cardiovascular;  Laterality: N/A;  . Tonsillectomy    . Breast surgery      breast reduction  . Eye surgery      bilateral cataracts  . Trigger finger release Right 05/22/2014    Procedure: RIGHT HAND A-1 PULLEY RELEASE ;  Surgeon: Roseanne Kaufman, MD;  Location: [facility];  Service: Orthopedics;  Laterality: Right;  . Dupuytren contracture release Right 05/22/2014    Procedure: DUPUYTREN RELEASE AND REPAIR AS NECESSARY RIGHT RING FINGER AND MIDDLE FINGER;  Surgeon: Roseanne Kaufman, MD;  Location: [facility];  Service: Orthopedics;  Laterality: Right;   History reviewed. No pertinent family history. History  Substance Use Topics  . Smoking status: Former Smoker    Types: Cigarettes  . Smokeless tobacco: Never Used  . Alcohol Use: 12.0  oz/week    6 Standard drinks or equivalent, 14 Glasses of wine per week     Comment: 4-5 glasses of wine per week   OB History  No data available     Review of Systems  Constitutional: Negative for fever.  Respiratory: Positive for chest tightness and shortness of breath. Negative for cough.   Cardiovascular: Positive for chest pain and palpitations. Negative for leg swelling.  Gastrointestinal: Negative for nausea, vomiting and abdominal pain.  Genitourinary: Negative for dysuria.  Musculoskeletal: Negative for back pain.  Skin: Negative for color change.  Neurological: Negative for light-headedness and headaches.  Psychiatric/Behavioral: Negative for confusion.  All other systems reviewed and are negative.     Allergies  Baclofen; Codeine phosphate; Hydrochlorothiazide; and Montelukast sodium  Home Medications   Prior to Admission medications   Medication Sig Start Date End Date Taking? Authorizing Provider  ADVAIR DISKUS 250-50 MCG/DOSE AEPB Inhale 2 puffs into the lungs 2 (two) times daily.  03/14/13   Historical Provider, MD  albuterol (PROVENTIL) (2.5 MG/3ML) 0.083% nebulizer solution Take 3 mLs (2.5 mg total) by nebulization every 6 (six) hours as needed for wheezing or shortness of breath. 08/09/13   Willeen Niece, MD  albuterol (VENTOLIN HFA) 108 (90 BASE) MCG/ACT inhaler Inhale 2 puffs into the lungs every 6 (six) hours as needed. If needed for shortness of breath. 02/14/14   Blane Ohara McDiarmid, MD  B Complex Vitamins (VITAMIN B COMPLEX PO) Take 1 tablet by mouth daily.    Historical Provider, MD  cholecalciferol (VITAMIN D) 1000 UNITS tablet Take 1,000 Units by mouth daily.    Historical Provider, MD  furosemide (LASIX) 40 MG tablet Take 40 mg by mouth as directed. 1 tablet once a week    Historical Provider, MD  LORazepam (ATIVAN) 0.5 MG tablet TAKE 1 TABLET BY MOUTH EVERY 8 HOURS AS NEEDED FOR ANXIETY 02/10/14   Blane Ohara McDiarmid, MD  metoprolol succinate (TOPROL-XL) 50  MG 24 hr tablet TAKE 1 TABLET BY MOUTH EVERY DAY WITH FOOD    Blane Ohara McDiarmid, MD  NITROSTAT 0.4 MG SL tablet PLACE ONE TABLET UNDER THE TONGUE EVERY FIVE MINUTES AS NEEDED.    Lind Covert, MD  omeprazole (PRILOSEC) 40 MG capsule TAKE 1 CAPSULE EVERY DAY. 11/25/13   Lind Covert, MD  simvastatin (ZOCOR) 40 MG tablet TAKE 1 TABLET BY MOUTH EVERY MORNING 11/18/13   Blane Ohara McDiarmid, MD  tiotropium (SPIRIVA) 18 MCG inhalation capsule Place 1 capsule (18 mcg total) into inhaler and inhale daily. 09/25/13   Blane Ohara McDiarmid, MD  traMADol (ULTRAM) 50 MG tablet Take 1 tablet (50 mg total) by mouth every 6 (six) hours as needed for moderate pain. 02/10/14   Blane Ohara McDiarmid, MD  vitamin B-12 (CYANOCOBALAMIN) 1000 MCG tablet Take 1,000 mcg by mouth daily.     Historical Provider, MD  warfarin (COUMADIN) 1 MG tablet TAKE TABLETS BY MOUTH AS DIRECTED BY PHYSICIAN. Patient taking differently: TAKE 3MG  ON MONDAY, WEDNESDAY AND FRIDAYS.  TAKE 2MG  ALL OTHER DAYS OF THE WEEK. 03/31/14   Blane Ohara McDiarmid, MD   Triage Vitals: BP 99/59 mmHg  Pulse 147  Temp(Src) 97.4 F (36.3 C) (Oral)  Resp 19  SpO2 97%   Physical Exam  Constitutional: She is oriented to person, place, and time. No distress.  Elderly  HENT:  Head: Normocephalic and atraumatic.  Eyes: Pupils are equal, round, and reactive to light.  Cardiovascular: Normal heart sounds.   Tachycardia, irregularly irregular rhythm  Pulmonary/Chest: Effort normal and breath sounds normal. No respiratory distress. She has no wheezes.  Abdominal: Soft. Bowel sounds are normal. There is no tenderness. There is  no rebound and no guarding.  Musculoskeletal: She exhibits no edema.  Neurological: She is alert and oriented to person, place, and time.  Skin: Skin is warm and dry.  Psychiatric: She has a normal mood and affect.  Nursing note and vitals reviewed.   ED Course  Procedures (including critical care time)  DIAGNOSTIC STUDIES: Oxy91gen  Saturation is 91% on RA, low by my interpretation.    COORDINATION OF CARE: 1:54 AM-Discussed treatment plan with pt at bedside and pt agreed to plan.     Labs Review Labs Reviewed  CBC - Abnormal; Notable for the following:    RBC 3.43 (*)    Hemoglobin 10.4 (*)    HCT 31.9 (*)    All other components within normal limits  BASIC METABOLIC PANEL - Abnormal; Notable for the following:    Potassium 3.1 (*)    Glucose, Bld 172 (*)    Creatinine, Ser 1.29 (*)    Calcium 8.3 (*)    GFR calc non Af Amer 36 (*)    GFR calc Af Amer 42 (*)    All other components within normal limits  BRAIN NATRIURETIC PEPTIDE - Abnormal; Notable for the following:    B Natriuretic Peptide 669.6 (*)    All other components within normal limits  PROTIME-INR  BASIC METABOLIC PANEL  PROTIME-INR  CBC  I-STAT TROPOININ, ED    Imaging Review Dg Chest Port 1 View  05/31/2014   CLINICAL DATA:  Acute onset of chest pain, shortness of breath and nausea. Initial encounter.  EXAM: PORTABLE CHEST - 1 VIEW  COMPARISON:  Chest radiograph performed 05/01/2014  FINDINGS: The lungs are well-aerated. Left basilar airspace opacity raises concern for pneumonia, though asymmetric interstitial edema might have a similar appearance. A small left pleural effusion is suspected. Mild right basilar airspace opacity is seen. No pneumothorax is identified.  The cardiomediastinal silhouette is mildly enlarged. A pacemaker is noted overlying the right chest wall, with leads ending overlying the right atrium and right ventricle. No acute osseous abnormalities are seen.  IMPRESSION: Left basilar airspace opacity raises concern for pneumonia, though asymmetric interstitial edema might have a similar appearance. Small left pleural effusion suspected. Mild right basilar airspace opacity seen. Mild cardiomegaly noted.   Electronically Signed   By: Garald Balding M.D.   On: 05/31/2014 00:33     EKG Interpretation   Date/Time:  Friday May 30 2014 23:17:28 EDT Ventricular Rate:  144 PR Interval:    QRS Duration: 111 QT Interval:  321 QTC Calculation: 497 R Axis:   -49 Text Interpretation:  Atrial fibrillation with rapid V-rate LAD, consider  left anterior fascicular block LVH with secondary repolarization  abnormality Anterior infarct, old Now in atrial fibrillation Confirmed by  HORTON  MD, Loma Sousa ([id number]) on 05/31/2014 1:56:18 AM      MDM   Final diagnoses:  Atrial fibrillation with RVR  Chest pain, unspecified chest pain type    Patient presents with chest pain and palpitations. Feels that she went in atrial fibrillation tonight. Her pressures are borderline and running [id number] systolic.  Patient is currently chest pain-free. Patient given a full dose aspirin. Basic labwork obtained. EKG shows atrial fibrillation with RVR. 5 mg IV metoprolol ordered. Troponin negative. Given borderline blood pressure and feeling like this is a recent onset atrial fibrillation, will consult cardiology regarding cardioversion. Patient is not therapeutic on her Coumadin.  Pacemaker interrogated. Patient went into atrial fibrillation and proximally 10 PM. Discussed with cardiology fellow.  She will be evaluated by cardiology. Expect admission.  I personally performed the services described in this documentation, which was scribed in my presence. The recorded information has been reviewed and is accurate.   Merryl Hacker, MD 05/31/14 0157

## 2014-05-31 DIAGNOSIS — Z950 Presence of cardiac pacemaker: Secondary | ICD-10-CM | POA: Diagnosis not present

## 2014-05-31 DIAGNOSIS — M1990 Unspecified osteoarthritis, unspecified site: Secondary | ICD-10-CM | POA: Diagnosis present

## 2014-05-31 DIAGNOSIS — E538 Deficiency of other specified B group vitamins: Secondary | ICD-10-CM | POA: Diagnosis present

## 2014-05-31 DIAGNOSIS — I4819 Other persistent atrial fibrillation: Secondary | ICD-10-CM | POA: Diagnosis present

## 2014-05-31 DIAGNOSIS — D509 Iron deficiency anemia, unspecified: Secondary | ICD-10-CM | POA: Diagnosis present

## 2014-05-31 DIAGNOSIS — E559 Vitamin D deficiency, unspecified: Secondary | ICD-10-CM | POA: Diagnosis present

## 2014-05-31 DIAGNOSIS — Z885 Allergy status to narcotic agent status: Secondary | ICD-10-CM | POA: Diagnosis not present

## 2014-05-31 DIAGNOSIS — I480 Paroxysmal atrial fibrillation: Principal | ICD-10-CM

## 2014-05-31 DIAGNOSIS — E876 Hypokalemia: Secondary | ICD-10-CM | POA: Diagnosis present

## 2014-05-31 DIAGNOSIS — R079 Chest pain, unspecified: Secondary | ICD-10-CM | POA: Diagnosis not present

## 2014-05-31 DIAGNOSIS — Z96659 Presence of unspecified artificial knee joint: Secondary | ICD-10-CM | POA: Diagnosis present

## 2014-05-31 DIAGNOSIS — Z9841 Cataract extraction status, right eye: Secondary | ICD-10-CM | POA: Diagnosis not present

## 2014-05-31 DIAGNOSIS — Z86010 Personal history of colonic polyps: Secondary | ICD-10-CM | POA: Diagnosis not present

## 2014-05-31 DIAGNOSIS — Z66 Do not resuscitate: Secondary | ICD-10-CM | POA: Diagnosis present

## 2014-05-31 DIAGNOSIS — H3530 Unspecified macular degeneration: Secondary | ICD-10-CM | POA: Diagnosis present

## 2014-05-31 DIAGNOSIS — I129 Hypertensive chronic kidney disease with stage 1 through stage 4 chronic kidney disease, or unspecified chronic kidney disease: Secondary | ICD-10-CM | POA: Diagnosis present

## 2014-05-31 DIAGNOSIS — I2510 Atherosclerotic heart disease of native coronary artery without angina pectoris: Secondary | ICD-10-CM | POA: Diagnosis present

## 2014-05-31 DIAGNOSIS — Z87891 Personal history of nicotine dependence: Secondary | ICD-10-CM | POA: Diagnosis not present

## 2014-05-31 DIAGNOSIS — I252 Old myocardial infarction: Secondary | ICD-10-CM | POA: Diagnosis not present

## 2014-05-31 DIAGNOSIS — Z888 Allergy status to other drugs, medicaments and biological substances status: Secondary | ICD-10-CM | POA: Diagnosis not present

## 2014-05-31 DIAGNOSIS — F419 Anxiety disorder, unspecified: Secondary | ICD-10-CM | POA: Diagnosis present

## 2014-05-31 DIAGNOSIS — I4891 Unspecified atrial fibrillation: Secondary | ICD-10-CM | POA: Diagnosis not present

## 2014-05-31 DIAGNOSIS — Z9842 Cataract extraction status, left eye: Secondary | ICD-10-CM | POA: Diagnosis not present

## 2014-05-31 DIAGNOSIS — M109 Gout, unspecified: Secondary | ICD-10-CM | POA: Diagnosis present

## 2014-05-31 DIAGNOSIS — J449 Chronic obstructive pulmonary disease, unspecified: Secondary | ICD-10-CM | POA: Diagnosis present

## 2014-05-31 DIAGNOSIS — Z7901 Long term (current) use of anticoagulants: Secondary | ICD-10-CM | POA: Diagnosis not present

## 2014-05-31 DIAGNOSIS — N183 Chronic kidney disease, stage 3 (moderate): Secondary | ICD-10-CM | POA: Diagnosis present

## 2014-05-31 HISTORY — DX: Other persistent atrial fibrillation: I48.19

## 2014-05-31 LAB — CBC
HEMATOCRIT: 30.9 % — AB (ref 36.0–46.0)
HEMATOCRIT: 31.9 % — AB (ref 36.0–46.0)
HEMOGLOBIN: 10.4 g/dL — AB (ref 12.0–15.0)
Hemoglobin: 10 g/dL — ABNORMAL LOW (ref 12.0–15.0)
MCH: 30.4 pg (ref 26.0–34.0)
MCH: 30.3 pg (ref 26.0–34.0)
MCHC: 32.4 g/dL (ref 30.0–36.0)
MCHC: 32.6 g/dL (ref 30.0–36.0)
MCV: 93 fL (ref 78.0–100.0)
MCV: 93.9 fL (ref 78.0–100.0)
PLATELETS: 270 10*3/uL (ref 150–400)
Platelets: 265 10*3/uL (ref 150–400)
RBC: 3.29 MIL/uL — ABNORMAL LOW (ref 3.87–5.11)
RBC: 3.43 MIL/uL — AB (ref 3.87–5.11)
RDW: 12.1 % (ref 11.5–15.5)
RDW: 12.2 % (ref 11.5–15.5)
WBC: 5.4 10*3/uL (ref 4.0–10.5)
WBC: 5.3 10*3/uL (ref 4.0–10.5)

## 2014-05-31 LAB — BASIC METABOLIC PANEL
ANION GAP: 6 (ref 5–15)
ANION GAP: 6 (ref 5–15)
BUN: 21 mg/dL (ref 6–23)
BUN: 22 mg/dL (ref 6–23)
CALCIUM: 8.3 mg/dL — AB (ref 8.4–10.5)
CALCIUM: 8.3 mg/dL — AB (ref 8.4–10.5)
CHLORIDE: 108 mmol/L (ref 96–112)
CO2: 28 mmol/L (ref 19–32)
CO2: 25 mmol/L (ref 19–32)
CREATININE: 1.26 mg/dL — AB (ref 0.50–1.10)
Chloride: 109 mmol/L (ref 96–112)
Creatinine, Ser: 1.29 mg/dL — ABNORMAL HIGH (ref 0.50–1.10)
GFR calc Af Amer: 42 mL/min — ABNORMAL LOW (ref >90)
GFR calc Af Amer: 43 mL/min — ABNORMAL LOW (ref >90)
GFR calc non Af Amer: 36 mL/min — ABNORMAL LOW (ref >90)
GFR calc non Af Amer: 37 mL/min — ABNORMAL LOW (ref >90)
Glucose, Bld: 141 mg/dL — ABNORMAL HIGH (ref 70–99)
Glucose, Bld: 172 mg/dL — ABNORMAL HIGH (ref 70–99)
Potassium: 3.1 mmol/L — ABNORMAL LOW (ref 3.5–5.1)
Potassium: 3.2 mmol/L — ABNORMAL LOW (ref 3.5–5.1)
SODIUM: 142 mmol/L (ref 135–145)
Sodium: 140 mmol/L (ref 135–145)

## 2014-05-31 LAB — HEPARIN LEVEL (UNFRACTIONATED)
Heparin Unfractionated: 0.15 IU/mL — ABNORMAL LOW (ref 0.30–0.70)
Heparin Unfractionated: 0.28 IU/mL — ABNORMAL LOW (ref 0.30–0.70)

## 2014-05-31 LAB — BRAIN NATRIURETIC PEPTIDE: B Natriuretic Peptide: 669.6 pg/mL — ABNORMAL HIGH (ref 0.0–100.0)

## 2014-05-31 LAB — PROTIME-INR
INR: 1.05 (ref 0.00–1.49)
INR: 1.09 (ref 0.00–1.49)
Prothrombin Time: 13.9 seconds (ref 11.6–15.2)
Prothrombin Time: 14.2 seconds (ref 11.6–15.2)

## 2014-05-31 MED ORDER — MOMETASONE FURO-FORMOTEROL FUM 100-5 MCG/ACT IN AERO
2.0000 | INHALATION_SPRAY | Freq: Two times a day (BID) | RESPIRATORY_TRACT | Status: DC
  Administered 2014-05-31 – 2014-06-01 (×3): 2 via RESPIRATORY_TRACT
  Filled 2014-05-31: qty 8.8

## 2014-05-31 MED ORDER — SIMVASTATIN 40 MG PO TABS
40.0000 mg | ORAL_TABLET | Freq: Every morning | ORAL | Status: DC
  Administered 2014-05-31 – 2014-06-01 (×2): 40 mg via ORAL
  Filled 2014-05-31 (×2): qty 1

## 2014-05-31 MED ORDER — TIOTROPIUM BROMIDE MONOHYDRATE 18 MCG IN CAPS
18.0000 ug | ORAL_CAPSULE | Freq: Every day | RESPIRATORY_TRACT | Status: DC
  Administered 2014-05-31 – 2014-06-01 (×2): 18 ug via RESPIRATORY_TRACT
  Filled 2014-05-31: qty 5

## 2014-05-31 MED ORDER — TRAMADOL HCL 50 MG PO TABS
50.0000 mg | ORAL_TABLET | Freq: Four times a day (QID) | ORAL | Status: DC | PRN
  Administered 2014-05-31: 50 mg via ORAL
  Filled 2014-05-31: qty 1

## 2014-05-31 MED ORDER — WARFARIN SODIUM 3 MG PO TABS: 3.0000 mg | ORAL_TABLET | ORAL | Status: DC

## 2014-05-31 MED ORDER — WARFARIN - PHYSICIAN DOSING INPATIENT
Freq: Every day | Status: DC
  Administered 2014-05-31: 18:00:00

## 2014-05-31 MED ORDER — HEPARIN (PORCINE) IN NACL 100-0.45 UNIT/ML-% IJ SOLN
1050.0000 [IU]/h | INTRAMUSCULAR | Status: DC
  Administered 2014-05-31: 750 [IU]/h via INTRAVENOUS
  Administered 2014-06-01: 1050 [IU]/h via INTRAVENOUS
  Filled 2014-05-31 (×3): qty 250

## 2014-05-31 MED ORDER — METOPROLOL TARTRATE 12.5 MG HALF TABLET
12.5000 mg | ORAL_TABLET | Freq: Four times a day (QID) | ORAL | Status: DC
  Administered 2014-05-31 – 2014-06-01 (×3): 12.5 mg via ORAL
  Filled 2014-05-31 (×9): qty 1

## 2014-05-31 MED ORDER — WARFARIN SODIUM 3 MG PO TABS
3.0000 mg | ORAL_TABLET | Freq: Once | ORAL | Status: AC
  Administered 2014-05-31: 3 mg via ORAL
  Filled 2014-05-31: qty 1

## 2014-05-31 MED ORDER — ACETAMINOPHEN 325 MG PO TABS: 650.0000 mg | ORAL_TABLET | ORAL | Status: DC | PRN

## 2014-05-31 MED ORDER — ONDANSETRON HCL 4 MG/2ML IJ SOLN: 4.0000 mg | Freq: Four times a day (QID) | INTRAMUSCULAR | Status: DC | PRN

## 2014-05-31 MED ORDER — HEPARIN BOLUS VIA INFUSION
2500.0000 [IU] | Freq: Once | INTRAVENOUS | Status: AC
  Administered 2014-05-31: 2500 [IU] via INTRAVENOUS
  Filled 2014-05-31: qty 2500

## 2014-05-31 MED ORDER — METOPROLOL TARTRATE 1 MG/ML IV SOLN
5.0000 mg | Freq: Once | INTRAVENOUS | Status: AC
  Administered 2014-05-31: 5 mg via INTRAVENOUS
  Filled 2014-05-31: qty 5

## 2014-05-31 MED ORDER — WARFARIN SODIUM 3 MG PO TABS: 3.0000 mg | ORAL_TABLET | Freq: Once | ORAL | Status: DC

## 2014-05-31 MED ORDER — ALBUTEROL SULFATE (2.5 MG/3ML) 0.083% IN NEBU: 2.5000 mg | INHALATION_SOLUTION | Freq: Four times a day (QID) | RESPIRATORY_TRACT | Status: DC | PRN

## 2014-05-31 MED ORDER — PANTOPRAZOLE SODIUM 40 MG PO TBEC
40.0000 mg | DELAYED_RELEASE_TABLET | Freq: Every day | ORAL | Status: DC
  Administered 2014-05-31 – 2014-06-01 (×2): 40 mg via ORAL
  Filled 2014-05-31 (×2): qty 1

## 2014-05-31 MED ORDER — NITROGLYCERIN 0.4 MG SL SUBL: 0.4000 mg | SUBLINGUAL_TABLET | SUBLINGUAL | Status: DC | PRN

## 2014-05-31 MED ORDER — WARFARIN SODIUM 2 MG PO TABS
2.0000 mg | ORAL_TABLET | ORAL | Status: DC
  Filled 2014-05-31: qty 1

## 2014-05-31 NOTE — Progress Notes (Addendum)
MD paged per pt request for diet.  Kathleen Argue S 11:29 AM  PA to place orders. 11:33 AM

## 2014-05-31 NOTE — Progress Notes (Addendum)
MD paged per pt request to come and see her for an update. [name] 2:32 PM   Dr. Ron Parker will round on patient as soon as possible. 2:41 PM

## 2014-05-31 NOTE — ED Notes (Signed)
Pt sleeping; [employer] report received, placed at bedside for Cardiology. HR 110-130s, afib

## 2014-05-31 NOTE — ED Notes (Signed)
Daughter: Yittel Rachford Cell: [id number]    Home: [phone]

## 2014-05-31 NOTE — ED Notes (Signed)
Cardiology at bedside.

## 2014-05-31 NOTE — Progress Notes (Addendum)
[facility] for Heparin (sub-therapeutic INR on warfarin) Indication: atrial fibrillation  Allergies  Allergen Reactions  . Baclofen Other (See Comments)    Confusion occurred with taking 3 at the same time.  . Codeine Phosphate Nausea Only  . Hydrochlorothiazide Other (See Comments)    Possible acute gouty arthritis of wrist  . Montelukast Sodium Other (See Comments)    Unspecified reaction.     Vital Signs: Temp: 98.2 F (36.8 C) (04/02 0252) Temp Source: Oral (04/02 0252) BP: 108/52 mmHg (04/02 0956) Pulse Rate: 61 (04/02 0956)  Labs:  Recent Labs  05/30/14 2335 05/31/14 0430 05/31/14 1100  HGB 10.4* 10.0*  --   HCT 31.9* 30.9*  --   PLT 270 265  --   LABPROT 13.9 14.2  --   INR 1.05 1.09  --   HEPARINUNFRC  --   --  0.15*  CREATININE 1.29* 1.26*  --    Estimated Creatinine Clearance: 30.1 mL/min (by C-G formula based on Cr of 1.26).  Assessment: 81 y/o F whose warfarin had been on hold due to hand surgery. INR is 1.05. Started heparin per pharmacy, and re-starting warfarin tonight. Hgb 10.4, mild renal dysfunction.   Initial heparin level low at 0.15, no IV issues or bleeding noted.  Goal of Therapy:  Heparin level 0.3-0.7 units/ml Monitor platelets by anticoagulation protocol: Yes   Plan:  -Increase heparin drip 950 units/hr -2000 HL -Warfarin 3mg  tonight -Daily CBC/HL -Monitor for bleeding -Heparin until INR >2   Erin Hearing PharmD., BCPS Clinical Pharmacist Pager [phone] 05/31/2014 12:18 PM  Addendum: HL just below goal (0.28) on 950 units/hr. No issues noted.  Plan: Increase to 1050/hr Recheck HL in 8 hours  Erin Hearing PharmD., BCPS Clinical Pharmacist Pager [phone] 05/31/2014 8:45 PM .

## 2014-05-31 NOTE — Progress Notes (Signed)
ANTICOAGULATION CONSULT NOTE - Initial Consult  Pharmacy Consult for Heparin (sub-therapeutic INR on warfarin) Indication: atrial fibrillation  Allergies  Allergen Reactions  . Baclofen Other (See Comments)    Confusion occurred with taking 3 at the same time.  . Codeine Phosphate Nausea Only  . Hydrochlorothiazide Other (See Comments)    Possible acute gouty arthritis of wrist  . Montelukast Sodium Other (See Comments)    Unspecified reaction.     Vital Signs: Temp: 97.4 F (36.3 C) (04/01 2318) Temp Source: Oral (04/01 2318) BP: 99/59 mmHg (04/02 0115) Pulse Rate: 147 (04/02 0115)  Labs:  Recent Labs  05/30/14 2335  HGB 10.4*  HCT 31.9*  PLT 270  LABPROT 13.9  INR 1.05  CREATININE 1.29*   Estimated Creatinine Clearance: 29.6 mL/min (by C-G formula based on Cr of 1.29).  Assessment: 81 y/o F whose warfarin had been on hold due to hand surgery. INR is 1.05. Starting heparin per pharmacy, and MD re-starting warfarin. Hgb 10.4, mild renal dysfunction.   Goal of Therapy:  Heparin level 0.3-0.7 units/ml Monitor platelets by anticoagulation protocol: Yes   Plan:  -Heparin 2500 units BOLUS since INR is normal -Start heparin drip at 750 units/hr -1100 HL -Daily CBC/HL -Monitor for bleeding -Heparin until INR >2   Narda Bonds 05/31/2014,2:08 AM

## 2014-05-31 NOTE — ED Notes (Signed)
Santiago Glad RN to interrogate pacemaker

## 2014-05-31 NOTE — H&P (Signed)
HPI: [name] is an 18F with CAD s/p MI, SSS s/p MDT PPM, paroxysmal AF, and COPD here with AF with RVR.  Patient was in her usual state of health until 10pm when she developed CP.  It was substernal and associated with palpitations.  She denies SOB or neausea.  She called EMS and upon their arrival she was found to be in AF with RVR, rate 137.  She has taken her metoprolol xl as prescribed but has been off warfarin for 1 week after a hand surgery.  She underwent R subtotal palmar fasciectomy, A1 pulley release with flexor tenolysis and tenosynovectomy for Dupuytren contracture of the R hand on 05/22/14.    Upon arrival to the ED [name] continued to be in AF in the 130s.  She received metoprolol 5mg  IV which lowered her HR to the 110s.  However, her BP was in the 80s-90s/50s.  She remained asymptomatic once her HR was lowered to the 110s.  Review of Systems:     Cardiac Review of Systems: {Y] = yes [ ]  = no  Chest Pain [  x  ]  Resting SOB [   ] Exertional SOB  [  ]  Orthopnea [  ]   Pedal Edema [   ]    Palpitations [x  ] Syncope  [  ]   Presyncope [   ]  General Review of Systems: [Y] = yes [  ]=no Constitional: recent weight change [  ]; anorexia [  ]; fatigue [  ]; nausea [  ]; night sweats [  ]; fever [  ]; or chills [  ];                                                                                                                                          Dental: poor dentition[  ];   Eye : blurred vision [  ]; diplopia [   ]; vision changes [  ];  Amaurosis fugax[  ]; Resp: cough [  ];  wheezing[  ];  hemoptysis[  ]; shortness of breath[  ]; paroxysmal nocturnal dyspnea[  ]; dyspnea on exertion[  ]; or orthopnea[  ];  GI:  gallstones[  ], vomiting[  ];  dysphagia[  ]; melena[  ];  hematochezia [  ]; heartburn[  ];   Hx of  Colonoscopy[  ]; GU: kidney stones [  ]; hematuria[  ];   dysuria [  ];  nocturia[  ];  history of     obstruction [  ];                 Skin: rash, swelling[  ];,  hair loss[  ];  peripheral edema[  ];  or itching[  ]; Musculosketetal: myalgias[  ];  joint swelling[  ];  joint erythema[  ];  joint pain[  ];  back pain[  ];  Heme/Lymph: bruising[  ];  bleeding[  ];  anemia[  ];  Neuro: TIA[  ];  headaches[  ];  stroke[  ];  vertigo[  ];  seizures[  ];   paresthesias[  ];  difficulty walking[  ];  Psych:depression[  ]; anxiety[  ];  Endocrine: diabetes[  ];  thyroid dysfunction[  ];  Immunizations: Flu [  ]; Pneumococcal[  ];  Other:  Past Medical History  Diagnosis Date  . Candidiasis of the esophagus 11/26/2007  . Myocardial infarct, old   . COPD, severe   . Sick sinus syndrome with tachycardia   . Pacemaker   . Chronic kidney disease (CKD), stage III (moderate)   . Hypertension   . Acute appendicitis with rupture   . Spinal stenosis, lumbar   . Adrenal adenoma     Incidentaloma  . Spondylolisthesis of lumbar region   . Lumbar herniated disc     History of HNP L4/5 in 2003  . Hx of colonoscopy with polypectomy 04/27/2010    Dr Cristina Gong found three  tubular adenomas each less than 10 mm size  . Retinal hemorrhage of left eye 06/2010  . Cataract 2013    Bilateral   . Abnormal mammogram, unspecified 08/23/2010    Followup imaging reassuring.  Repeat in 6 months.   Marland Kitchen DISC WITH RADICULOPATHY 04/27/2006    Qualifier: Diagnosis of  By: McDiarmid MD, Sherren Mocha    . Transitional cell carcinoma of ureter, history   . Mild cognitive impairment 10/26/2012    (10/25/12) Failed MiniCog screen  . 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 Normal uric acid serum level at time of attack.    Marland Kitchen COPD (chronic obstructive pulmonary disease)   . EDEMA-LEGS,DUE TO VENOUS OBSTRUCT. 04/27/2006    Qualifier: Diagnosis of  By: McDiarmid MD, Sherren Mocha    . ANXIETY 04/27/2006    Qualifier: Diagnosis of  By: McDiarmid MD, Sherren Mocha    . HERNIA, HIATAL, NONCONGENITAL 04/27/2006    Qualifier: Diagnosis of  By:  McDiarmid MD, Sherren Mocha    . History of Hemorrhoids 04/27/2006    Qualifier: Diagnosis of  By: McDiarmid MD, Sherren Mocha    . RHINITIS, ALLERGIC 04/27/2006    Qualifier: Diagnosis of  By: McDiarmid MD, Sherren Mocha    . SCHATZKI'S RING, HX OF 11/26/2007    Qualifier: Diagnosis of  By: McDiarmid MD, Sherren Mocha  An EGD was performed by Dr Cristina Gong on 04/27/2010 for iron deficiency anemia. There was a a transient hiatal hernia with Schatzki's ring. Stomach and duodenum were normal. EGD on 03/06/12 by Dr Cristina Gong for IDA non-obstructing Schatzki's ring at Gastroesophageal junction, otherwise normal esophagus and stomach.    . Urge incontinence 12/13/2011    Diagnosed in 10/2011 by Dr Bjorn Loser (Urology)   . VITAMIN B12 DEFICIENCY 10/07/2009    Qualifier: Diagnosis of  By: McDiarmid MD, Sherren Mocha  Dx based on a post-TKR anemia work-up Low normal serum B12 with high Methylmalonic acid and homocysteine level  Vit B12 serum level (10/28/10) > 1500 pg/mL   . Vitamin D deficiency 11/02/2010    Serum vitamin D 25(OH) = 10.9 ng/mL (30 -100) on 10/28/10 c/w Vitamin D deficiency.     . AF (paroxysmal atrial fibrillation) 11/11/2010    Hospitalization (9/8-9/10, Dr Daneen Schick, III, Cardiology) for Paroxysmal Atrial Fibrillation with RVR and anginal pain secondary to demand/supply mismatch in setting of  RVR with known circumflex artery branch disease.    Marland Kitchen HYPERTENSION, BENIGN ESSENTIAL 06/18/2007    Qualifier: Diagnosis of  By: McDiarmid MD, Sherren Mocha    . Iron deficiency anemia 08/05/2010    Dr Cristina Gong (GI) has evaluated with EGD, colonoscopy, and video capsular endoscopy in 2011 & 2012.  All have been unrevealing as to an origin of IDA.  OV with Dr Cristina Gong (10/28/10) assessment of blood in stool per hemoccult and GER. Hbg 12.1 g/dL, MCV 91.8, Ferritin 30 ng/mL. Patient taking on ferrous sulfate tab daily.   EGD on 03/06/12 by Dr Cristina Gong for IDA non-obstructing Schatzki's ring at Gastroesophageal junction, otherwise normal esophagus and stomach.     .  Macular degeneration, bilateral 10/04/2010    Right eye is wet MD, the other is dry macular degeneration (ARMD). Pt undergoing some form of vascular endothelial growth factor inhibition intraocular therapy.    . VENTRICULAR HYPERTROPHY, LEFT 08/28/2008    Qualifier: Diagnosis of  By: McDiarmid MD, Sherren Mocha    . CHRONIC KIDNEY DISEASE STAGE III (MODERATE) 09/02/2009    Patient with Solitary Kidney S/P total Nephrectomy for transitional cell caner.    Marland Kitchen COPD 04/27/2006    Qualifier: Diagnosis of  By: McDiarmid MD, Sherren Mocha    . MYOCARDIAL INFARCTION, HX OF 09/25/2008    Qualifier: Diagnosis of  By: McDiarmid MD, Todd  Hospitalized for NSTEMI ( 10/02/2008). PCI (10/02/2008, Dr Daneen Schick) with RCA Doyle placed: 99% lesion to 0% lesion. Circumflex Obtuse marginal with 90% stenosis (left for medical mangement for now)   Hospitalization (9/8-9/10, Dr Daneen Schick, III, Cardiology) for Paroxysmal Atrial Fibrillation with RVR and anginal pain secondary to demand/supply mismatch in setting of RVR with known circumflex artery branch disease.    Marland Kitchen SICK SINUS SYNDROME 04/27/2006    Qualifier: Diagnosis of  By: McDiarmid MD, Sherren Mocha  S/P dual chamber pacemaker placement by Dr Osie Cheeks (EPS-Card) with pacemaker lead extraction & reinsertion - 07/25/2003  Hospitalization (9/8-9/10, Dr Daneen Schick, III, Cardiology) for Paroxysmal Atrial Fibrillation with RVR and anginal pain secondary to demand/supply mismatch in setting of RVR with known circumflex artery branch disease.    . Solitary kidney, acquired 05/17/2010    Surgical removal for transitional cell cancer by Tresa Endo, MD (Urol). Surveillance cystoscopy by Dr Alinda [facility]) on 10/19/12 without evidence of cystoscopic recurrence. Recommend RTC one year for cystoscopy.   . ADENOMATOUS COLONIC POLYP 03/01/2003    Qualifier: Diagnosis of  By: McDiarmid MD, Sherren Mocha Multiple benign polyps of cecum, ascending, transverse and sigmoid colon by 8/09 colonoscopy by Dr  Cristina Gong  Colonoscopy by Dr Cristina Gong for iron-deficiency anemia on 04/27/2010 showed three sessile polyps that were in ascending (3 mm x 9 mm), transverse (4 mm), and cecum (3 mm).  All three were tubular adenomas that were negative for high grade dysplasia or malignancy on pathology. Dr Cristina Gong called the polpys benign and not requiring follow-up in view of the patients age.    . Soft tissue injury of foot 05/03/2011  . PREDIABETES 09/11/2007    Qualifier: Diagnosis of  By: McDiarmid MD, Sherren Mocha    . Numbness and tingling in hands 07/21/2011  . MUSCLE CRAMPS 03/11/2010    Qualifier: Diagnosis of  By: McDiarmid MD, Sherren Mocha    . Mammogram abnormal 02/15/2011  . LUMBAR SPINAL STENOSIS 04/27/2006    Qualifier: Diagnosis of  By: McDiarmid MD, Francisca December HNP w/ L4&5 root encroachment - 05/24/2001,   Spinal Stenosis: L-S MRI: L4-5 Spinal  stenosis, - 03/03/2006, Spondylolithesis L5-S1(mild), DJD L4-5 on X-Ray 11/04   . Leg cramps 05/29/2012  . 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 Normal uric acid serum level at time of attack.    . Cellulitis of leg, right 10/01/2012  . Solar lentigo 06/15/2012  . Muscle spasm of back 09/24/2013  . 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  No mass lesion seen. Likely cause of patients 2 to 3 episodes of bleeding from patient's right eye.       (Not in a hospital admission)   Allergies  Allergen Reactions  . Baclofen Other (See Comments)    Confusion occurred with taking 3 at the same time.  . Codeine Phosphate Nausea Only  . Hydrochlorothiazide Other (See Comments)    Possible acute gouty arthritis of wrist  . Montelukast Sodium Other (See Comments)    Unspecified reaction.      History   Social History  . Marital Status: Widowed    Spouse Name: N/A  . Number of Children: 4  . Years of Education: N/A   Occupational History  . Futures trader     Retired   Social History Main Topics  . Smoking status: Former Smoker    Types: Cigarettes  . Smokeless tobacco: Never Used  . Alcohol Use: 12.0 oz/week    6 Standard drinks or equivalent, 14 Glasses of wine per week     Comment: 4-5 glasses of wine per week  . Drug Use: No  . Sexual Activity: No   Other Topics Concern  . 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    Semi-retired Futures trader.     Pt has home nebulizer for inhalation therapies.   Former Smoker   Smoking Status:  quit > 5 years ago      (+) DNR status per discussion with Dr McDiarmid 10/25/12 and reiterated 06/20/13 office visit .   (+) Living Will/Advance Directive   (+) HC-POA: Cecile Sheerer Causey (pt's dgt)    History reviewed. No pertinent family history.  PHYSICAL EXAM: Filed Vitals:   05/31/14 0115  BP: 99/59  Pulse: 147  Temp:   Resp: 19   General:  Well appearing. No respiratory difficulty HEENT: normal Neck: supple. no JVD. Carotids 2+ bilat; no bruits. No lymphadenopathy or thryomegaly appreciated. Cor: Irregularly irregular.  No rubs, gallops or murmurs. Lungs: clear bilaterally. Abdomen: soft, nontender, nondistended. No hepatosplenomegaly. No bruits or masses. Good bowel sounds. Extremities: no cyanosis, clubbing, rash, edema Neuro: alert & oriented x 3, cranial nerves grossly intact. moves all 4 extremities w/o difficulty. Affect pleasant.  ECG: Atrial fibrillation rate 144.  LAFB.  Old anteroseptal MI.  Results for orders placed or performed during the hospital encounter of 05/30/14 (from the past 24 hour(s))  CBC      Status: Abnormal   Collection Time: 05/30/14 11:35 PM  Result Value Ref Range   WBC 5.3 4.0 - 10.5 K/uL   RBC 3.43 (L) 3.87 - 5.11 MIL/uL  Hemoglobin 10.4 (L) 12.0 - 15.0 g/dL   HCT 31.9 (L) 36.0 - 46.0 %   MCV 93.0 78.0 - 100.0 fL   MCH 30.3 26.0 - 34.0 pg   MCHC 32.6 30.0 - 36.0 g/dL   RDW 12.1 11.5 - 15.5 %   Platelets 270 150 - 400 K/uL  Basic metabolic panel     Status: Abnormal   Collection Time: 05/30/14 11:35 PM  Result Value Ref Range   Sodium 140 135 - 145 mmol/L   Potassium 3.1 (L) 3.5 - 5.1 mmol/L   Chloride 109 96 - 112 mmol/L   CO2 25 19 - 32 mmol/L   Glucose, Bld 172 (H) 70 - 99 mg/dL   BUN 22 6 - 23 mg/dL   Creatinine, Ser 1.29 (H) 0.50 - 1.10 mg/dL   Calcium 8.3 (L) 8.4 - 10.5 mg/dL   GFR calc non Af Amer 36 (L) >90 mL/min   GFR calc Af Amer 42 (L) >90 mL/min   Anion gap 6 5 - 15  Protime-INR (if pt is taking Coumadin)     Status: None   Collection Time: 05/30/14 11:35 PM  Result Value Ref Range   Prothrombin Time 13.9 11.6 - 15.2 seconds   INR 1.05 0.00 - 1.49  Brain natriuretic peptide     Status: Abnormal   Collection Time: 05/30/14 11:35 PM  Result Value Ref Range   B Natriuretic Peptide 669.6 (H) 0.0 - 100.0 pg/mL  I-stat troponin, ED (not at [facility])     Status: None   Collection Time: 05/30/14 11:44 PM  Result Value Ref Range   Troponin i, poc 0.01 0.00 - 0.08 ng/mL   Comment 3           Dg Chest Port 1 View  05/31/2014   CLINICAL DATA:  Acute onset of chest pain, shortness of breath and nausea. Initial encounter.  EXAM: PORTABLE CHEST - 1 VIEW  COMPARISON:  Chest radiograph performed 05/01/2014  FINDINGS: The lungs are well-aerated. Left basilar airspace opacity raises concern for pneumonia, though asymmetric interstitial edema might have a similar appearance. A small left pleural effusion is suspected. Mild right basilar airspace opacity is seen. No pneumothorax is identified.  The cardiomediastinal silhouette is mildly enlarged. A pacemaker is noted  overlying the right chest wall, with leads ending overlying the right atrium and right ventricle. No acute osseous abnormalities are seen.  IMPRESSION: Left basilar airspace opacity raises concern for pneumonia, though asymmetric interstitial edema might have a similar appearance. Small left pleural effusion suspected. Mild right basilar airspace opacity seen. Mild cardiomegaly noted.   Electronically Signed   By: Garald Balding M.D.   On: 05/31/2014 00:33     ASSESSMENT:  47F with CAD s/p MI, SSS s/p MDT PPM, paroxysmal AF, and COPD here with AF with RVR.  Atrial fibrillation is poorly controlled, but BP prevents aggressive nodal therapy.  She tolerates Metoprolol XL 50mg  daily at home, and BP will likely improve in NSR.  [employer] [facility] report shows that she has very infrequent episodes of AF.  6 mode switch episodes in the last month.  Prior to tonight the longest episode was 2.5h on 3/10.  This episode began 4/1.   PLAN/DISCUSSION: - Hold home metoprolol XL - Start metoprolol tartrate 12.5mg  6h and titrate if BP allows. - No diltiazem given hypotension - start heparin continuous infusion - restart warfarin that was held for procedure (now POD 9) - will likely require DCCV if she does not  convert to sinus rhythm overnight

## 2014-05-31 NOTE — Progress Notes (Signed)
The patient was admitted after midnight. Therapy has been started. She is stable. I'm requesting that Coumadin be managed by pharmacy. Her monitor this morning shows that her rhythm is paced.  Daryel November, MD

## 2014-05-31 NOTE — ED Notes (Signed)
Pt transferred to 2W; heparin received and tubed to 2W

## 2014-06-01 LAB — CBC
HCT: 31.2 % — ABNORMAL LOW (ref 36.0–46.0)
HEMOGLOBIN: 10.1 g/dL — AB (ref 12.0–15.0)
MCH: 30.3 pg (ref 26.0–34.0)
MCHC: 32.4 g/dL (ref 30.0–36.0)
MCV: 93.7 fL (ref 78.0–100.0)
Platelets: 258 10*3/uL (ref 150–400)
RBC: 3.33 MIL/uL — ABNORMAL LOW (ref 3.87–5.11)
RDW: 12.2 % (ref 11.5–15.5)
WBC: 5 10*3/uL (ref 4.0–10.5)

## 2014-06-01 LAB — PROTIME-INR
INR: 1.01 (ref 0.00–1.49)
PROTHROMBIN TIME: 13.4 s (ref 11.6–15.2)

## 2014-06-01 LAB — HEPARIN LEVEL (UNFRACTIONATED): Heparin Unfractionated: 0.33 IU/mL (ref 0.30–0.70)

## 2014-06-01 MED ORDER — METOPROLOL SUCCINATE ER 50 MG PO TB24: 50.0000 mg | ORAL_TABLET | Freq: Every day | ORAL | Status: DC

## 2014-06-01 MED ORDER — WARFARIN SODIUM 3 MG PO TABS
3.0000 mg | ORAL_TABLET | Freq: Once | ORAL | Status: AC
Start: 2014-06-01 — End: 2014-06-01
  Administered 2014-06-01: 3 mg via ORAL
  Filled 2014-06-01: qty 1

## 2014-06-01 MED ORDER — POTASSIUM CHLORIDE CRYS ER 20 MEQ PO TBCR
20.0000 meq | EXTENDED_RELEASE_TABLET | Freq: Once | ORAL | Status: AC
  Administered 2014-06-01: 20 meq via ORAL
  Filled 2014-06-01: qty 1

## 2014-06-01 NOTE — Progress Notes (Signed)
Pt discharged home  Discharge instructions given & reviewed Education discussed  IV dc'd  Tele dc'd  Pt discharged via wheelchair with nursing staff All patient belongs at side.   [name] 2:36 PM

## 2014-06-01 NOTE — Discharge Summary (Signed)
Discharge Summary   Patient ID: [name],  MRN: [id number], DOB/AGE: 79/07/1923 79 y.o.  Admit date: 05/30/2014 Discharge date: 06/01/2014  Primary Care Provider: MCDIARMID,TODD D Primary Cardiologist: Clearence Cheek  Discharge Diagnoses Principal Problem:   Atrial fibrillation Active Problems:   HYPERTENSION, BENIGN ESSENTIAL   Pacemaker   On warfarin therapy   Allergies Allergies  Allergen Reactions  . Baclofen Other (See Comments)    Confusion occurred with taking 3 at the same time.  . Codeine Phosphate Nausea Only  . Hydrochlorothiazide Other (See Comments)    Possible acute gouty arthritis of wrist  . Montelukast Sodium Other (See Comments)    Unspecified reaction.      Hospital Course  The patient is a 81 year old female with past medical history of CAD s/p MI, SSS s/p MDT PPM, paroxysmal AF, and COPD here with AF with RVR. She was in her usual state of health until 10 PM on the night of 05/30/2014 when she developed chest pain. It was substernal and associated with palpitation. She called EMS, upon their arrival she was found to be in atrial fibrillation with RVR with heart rate of 137. She has been taking her metoprolol XL as prescribed however has been off Coumadin for one week after hand surgery. She underwent a right subtotal palmar fasciotomy, A1 pulley release with flexor tenolysis and tenosynovectomy for temperature and contracture of the right hand on 05/22/2014.   She was admitted to cardiology service, and was placed on metoprolol tartrate 12.5 mg every 6 hours to control heart rate. Her Coumadin was restarted since she was POD9. Overnight, patient converted to normal sinus rhythm. She was seen in the morning of 06/01/2014, at which time she was stable without significant discomfort. She is deemed stable for discharge from cardiology perspective to resume her home medication. I have sent a new prescription for long-acting Toprol to a pharmacy. Patient instructed to  resume her Coumadin as previously scheduled, she will need to check her PT/INR on Friday 06/06/2014. I have since left message to Dr. Thompson Caul office to arrange 2-4 weeks follow-up.   Discharge Vitals Blood pressure 116/62, pulse 79, temperature 97.8 F (36.6 C), temperature source Oral, resp. rate 16, height 5\' 4"  (1.626 m), weight 158 lb 14.5 oz (72.08 kg), SpO2 96 %.  Filed Weights   05/31/14 0256  Weight: 158 lb 14.5 oz (72.08 kg)    Labs  CBC  Recent Labs  05/31/14 0430 06/01/14 0545  WBC 5.4 5.0  HGB 10.0* 10.1*  HCT 30.9* 31.2*  MCV 93.9 93.7  PLT 265 0000000   Basic Metabolic Panel  Recent Labs  05/30/14 2335 05/31/14 0430  NA 140 142  K 3.1* 3.2*  CL 109 108  CO2 25 28  GLUCOSE 172* 141*  BUN 22 21  CREATININE 1.29* 1.26*  CALCIUM 8.3* 8.3*    Disposition  Pt is being discharged home today in good condition.  Follow-up Plans & Appointments      Follow-up Information    Follow up with Sinclair Grooms, MD.   Specialty:  Cardiology   Why:  Office will contact you to arrange followup. Please give [place] a call if you do not hear from [place] in 2 business days   Contact information:   [address] [place] [address] [phone]       Follow up with [facility] Office On 06/06/2014.   Specialty:  Cardiology   Why:  Please obtain  PT/INR check next Friday at [facility] location after resuming home dose of coumadin.   Contact information:   [address], [place] [phone]      Discharge Medications    Medication List    TAKE these medications        ADVAIR DISKUS 250-50 MCG/DOSE Aepb  Generic drug:  Fluticasone-Salmeterol  Inhale 2 puffs into the lungs 2 (two) times daily.     albuterol (2.5 MG/3ML) 0.083% nebulizer solution  Commonly known as:  PROVENTIL  Take 3 mLs (2.5 mg total) by nebulization every 6 (six) hours as needed for wheezing or shortness of breath.      albuterol 108 (90 BASE) MCG/ACT inhaler  Commonly known as:  VENTOLIN HFA  Inhale 2 puffs into the lungs every 6 (six) hours as needed. If needed for shortness of breath.     cholecalciferol 1000 UNITS tablet  Commonly known as:  VITAMIN D  Take 1,000 Units by mouth daily.     furosemide 40 MG tablet  Commonly known as:  LASIX  Take 40 mg by mouth once a week. 1 tablet once a week     LORazepam 0.5 MG tablet  Commonly known as:  ATIVAN  TAKE 1 TABLET BY MOUTH EVERY 8 HOURS AS NEEDED FOR ANXIETY     metoprolol succinate 50 MG 24 hr tablet  Commonly known as:  TOPROL-XL  Take 1 tablet (50 mg total) by mouth daily. with food     NITROSTAT 0.4 MG SL tablet  Generic drug:  nitroGLYCERIN  PLACE ONE TABLET UNDER THE TONGUE EVERY FIVE MINUTES AS NEEDED.     omeprazole 40 MG capsule  Commonly known as:  PRILOSEC  TAKE 1 CAPSULE EVERY DAY.     simvastatin 40 MG tablet  Commonly known as:  ZOCOR  TAKE 1 TABLET BY MOUTH EVERY MORNING     tiotropium 18 MCG inhalation capsule  Commonly known as:  SPIRIVA  Place 1 capsule (18 mcg total) into inhaler and inhale daily.     traMADol 50 MG tablet  Commonly known as:  ULTRAM  Take 1 tablet (50 mg total) by mouth every 6 (six) hours as needed for moderate pain.     VITAMIN B COMPLEX PO  Take 1 tablet by mouth daily.     vitamin B-12 1000 MCG tablet  Commonly known as:  CYANOCOBALAMIN  Take 1,000 mcg by mouth daily.     warfarin 1 MG tablet  Commonly known as:  COUMADIN  TAKE TABLETS BY MOUTH AS DIRECTED BY PHYSICIAN.        Outstanding Labs/Studies  PT/INR at [facility] office on Fri 06/06/2014  Duration of Discharge Encounter   Greater than 30 minutes including physician time.  Hilbert Corrigan PA-C Pager: [phone] 06/01/2014, 1:06 PM Patient seen and examined. I agree with the assessment and plan as detailed above. See also my additional thoughts below.   I made the decision for the patient to be discharged. I agree with  the node is outlined. See my progress note from today also.  Dola Argyle, MD, [facility] 06/02/2014 7:58 AM

## 2014-06-01 NOTE — Progress Notes (Signed)
[facility] for Heparin (sub-therapeutic INR on warfarin) Indication: atrial fibrillation  Allergies  Allergen Reactions  . Baclofen Other (See Comments)    Confusion occurred with taking 3 at the same time.  . Codeine Phosphate Nausea Only  . Hydrochlorothiazide Other (See Comments)    Possible acute gouty arthritis of wrist  . Montelukast Sodium Other (See Comments)    Unspecified reaction.     Vital Signs: Temp: 97.8 F (36.6 C) (04/03 0434) Temp Source: Oral (04/03 0434) BP: 116/62 mmHg (04/03 0930) Pulse Rate: 79 (04/03 0930)  Labs:  Recent Labs  05/30/14 2335 05/31/14 0430 05/31/14 1100 05/31/14 1935 06/01/14 0545  HGB 10.4* 10.0*  --   --  10.1*  HCT 31.9* 30.9*  --   --  31.2*  PLT 270 265  --   --  258  LABPROT 13.9 14.2  --   --  13.4  INR 1.05 1.09  --   --  1.01  HEPARINUNFRC  --   --  0.15* 0.28* 0.33  CREATININE 1.29* 1.26*  --   --   --    Estimated Creatinine Clearance: 30.1 mL/min (by C-G formula based on Cr of 1.26).  Assessment: 81 y/o F whose warfarin had been on hold due to hand surgery. Baseline INR was 1.05. Started heparin per pharmacy, and warfarin restarted 4/2.   Heparin level at goal this am (0.33), no IV issues or bleeding noted. INR unchanged over past few days at 1.0. Patient converted to NSR.  Noted plans for d/c this afternoon and to have INR follow up as outpatient, no mention of bridging needed.  Goal of Therapy:  INR goal 2-3 Heparin level 0.3-0.7 units/ml Monitor platelets by anticoagulation protocol: Yes   Plan:  -No change in heparin - will stop at discharge -Warfarin 3mg  today prior to d/c then resume home dose    Erin Hearing PharmD., BCPS Clinical Pharmacist Pager [phone] 06/01/2014 10:28 AM

## 2014-06-01 NOTE — Progress Notes (Signed)
SUBJECTIVE: Patient is improved since admission. Her rhythm converted and she is pacing.   Filed Vitals:   06/01/14 0320 06/01/14 0434 06/01/14 0754 06/01/14 0804  BP: 125/63 126/62 137/72   Pulse: 59 61 64   Temp:  97.8 F (36.6 C)    TempSrc:  Oral    Resp:  16    Height:      Weight:      SpO2: 97% 96%  96%     Intake/Output Summary (Last 24 hours) at 06/01/14 0929 Last data filed at 05/31/14 1847  Gross per 24 hour  Intake    445 ml  Output      0 ml  Net    445 ml    LABS: Basic Metabolic Panel:  Recent Labs  05/30/14 2335 05/31/14 0430  NA 140 142  K 3.1* 3.2*  CL 109 108  CO2 25 28  GLUCOSE 172* 141*  BUN 22 21  CREATININE 1.29* 1.26*  CALCIUM 8.3* 8.3*   Liver Function Tests: No results for input(s): AST, ALT, ALKPHOS, BILITOT, PROT, ALBUMIN in the last 72 hours. No results for input(s): LIPASE, AMYLASE in the last 72 hours. CBC:  Recent Labs  05/31/14 0430 06/01/14 0545  WBC 5.4 5.0  HGB 10.0* 10.1*  HCT 30.9* 31.2*  MCV 93.9 93.7  PLT 265 258   Cardiac Enzymes: No results for input(s): CKTOTAL, CKMB, CKMBINDEX, TROPONINI in the last 72 hours. BNP: Invalid input(s): POCBNP D-Dimer: No results for input(s): DDIMER in the last 72 hours. Hemoglobin A1C: No results for input(s): HGBA1C in the last 72 hours. Fasting Lipid Panel: No results for input(s): CHOL, HDL, LDLCALC, TRIG, CHOLHDL, LDLDIRECT in the last 72 hours. Thyroid Function Tests: No results for input(s): TSH, T4TOTAL, T3FREE, THYROIDAB in the last 72 hours.  Invalid input(s): FREET3  RADIOLOGY: Dg Chest Port 1 View  05/31/2014   CLINICAL DATA:  Acute onset of chest pain, shortness of breath and nausea. Initial encounter.  EXAM: PORTABLE CHEST - 1 VIEW  COMPARISON:  Chest radiograph performed 05/01/2014  FINDINGS: The lungs are well-aerated. Left basilar airspace opacity raises concern for pneumonia, though asymmetric interstitial edema might have a similar appearance. A  small left pleural effusion is suspected. Mild right basilar airspace opacity is seen. No pneumothorax is identified.  The cardiomediastinal silhouette is mildly enlarged. A pacemaker is noted overlying the right chest wall, with leads ending overlying the right atrium and right ventricle. No acute osseous abnormalities are seen.  IMPRESSION: Left basilar airspace opacity raises concern for pneumonia, though asymmetric interstitial edema might have a similar appearance. Small left pleural effusion suspected. Mild right basilar airspace opacity seen. Mild cardiomegaly noted.   Electronically Signed   By: Garald Balding M.D.   On: 05/31/2014 00:33    PHYSICAL EXAM   patient is oriented to person time and place. Affect is normal. Head is atraumatic. Sclera and conjunctiva are normal. Lungs reveal a few scattered rhonchi. There is no respiratory distress. Cardiac exam reveals an S1 and S2. The abdomen is soft. There is no significant peripheral edema.  TELEMETRY: I have reviewed telemetry today June 01, 2014. She is pacing.   ASSESSMENT AND PLAN:    Atrial fibrillation     Patient converted to sinus rhythm. I will not be making any significant changes in her meds. She is stable to receive her home meds as she goes home today.    On warfarin therapy     The patient  had not been restarted on her Coumadin after her hand surgery. We have started her Coumadin in the hospital. Her coumadinization can be completed as an outpatient. We will plan to make careful follow-up with the team that follows her Coumadin.    Hypokalemia    Potassium will be given this morning.  Patient is stable and ready to go home today. Her family will be able to pick her up between 1 and 2:00 this afternoon.  Dola Argyle 06/01/2014 9:29 AM

## 2014-06-01 NOTE — Discharge Instructions (Signed)
Information on my medicine - Coumadin®   (Warfarin) ° °Why was Coumadin prescribed for you? °Coumadin was prescribed for you because you have a blood clot or a medical condition that can cause an increased risk of forming blood clots. Blood clots can cause serious health problems by blocking the flow of blood to the heart, lung, or brain. Coumadin can prevent harmful blood clots from forming. °As a reminder your indication for Coumadin is:   Stroke Prevention Because Of Atrial Fibrillation ° °What test will check on my response to Coumadin? °While on Coumadin (warfarin) you will need to have an INR test regularly to ensure that your dose is keeping you in the desired range. The INR (international normalized ratio) number is calculated from the result of the laboratory test called prothrombin time (PT). ° °If an INR APPOINTMENT HAS NOT ALREADY BEEN MADE FOR YOU please schedule an appointment to have this lab work done by your health care provider within 7 days. °Your INR goal is usually a number between:  2 to 3 or your provider may give you a more narrow range like 2-2.5.  Ask your health care provider during an office visit what your goal INR is. ° °What  do you need to  know  About  COUMADIN? °Take Coumadin (warfarin) exactly as prescribed by your healthcare provider about the same time each day.  DO NOT stop taking without talking to the doctor who prescribed the medication.  Stopping without other blood clot prevention medication to take the place of Coumadin may increase your risk of developing a new clot or stroke.  Get refills before you run out. ° °What do you do if you miss a dose? °If you miss a dose, take it as soon as you remember on the same day then continue your regularly scheduled regimen the next day.  Do not take two doses of Coumadin at the same time. ° °Important Safety Information °A possible side effect of Coumadin (Warfarin) is an increased risk of bleeding. You should call your healthcare  provider right away if you experience any of the following: °? Bleeding from an injury or your nose that does not stop. °? Unusual colored urine (red or dark brown) or unusual colored stools (red or black). °? Unusual bruising for unknown reasons. °? A serious fall or if you hit your head (even if there is no bleeding). ° °Some foods or medicines interact with Coumadin® (warfarin) and might alter your response to warfarin. To help avoid this: °? Eat a balanced diet, maintaining a consistent amount of Vitamin K. °? Notify your provider about major diet changes you plan to make. °? Avoid alcohol or limit your intake to 1 drink for women and 2 drinks for men per day. °(1 drink is 5 oz. wine, 12 oz. beer, or 1.5 oz. liquor.) ° °Make sure that ANY health care provider who prescribes medication for you knows that you are taking Coumadin (warfarin).  Also make sure the healthcare provider who is monitoring your Coumadin knows when you have started a new medication including herbals and non-prescription products. ° °Coumadin® (Warfarin)  Major Drug Interactions  °Increased Warfarin Effect Decreased Warfarin Effect  °Alcohol (large quantities) °Antibiotics (esp. Septra/Bactrim, Flagyl, Cipro) °Amiodarone (Cordarone) °Aspirin (ASA) °Cimetidine (Tagamet) °Megestrol (Megace) °NSAIDs (ibuprofen, naproxen, etc.) °Piroxicam (Feldene) °Propafenone (Rythmol SR) °Propranolol (Inderal) °Isoniazid (INH) °Posaconazole (Noxafil) Barbiturates (Phenobarbital) °Carbamazepine (Tegretol) °Chlordiazepoxide (Librium) °Cholestyramine (Questran) °Griseofulvin °Oral Contraceptives °Rifampin °Sucralfate (Carafate) °Vitamin K  ° °Coumadin® (Warfarin) Major Herbal   Interactions  Increased Warfarin Effect Decreased Warfarin Effect  Garlic Ginseng Ginkgo biloba Coenzyme Q10 Green tea St. Johns wort    Coumadin (Warfarin) FOOD Interactions  Eat a consistent number of servings per week of foods HIGH in Vitamin K (1 serving =  cup)  Collards  (cooked, or boiled & drained) Kale (cooked, or boiled & drained) Mustard greens (cooked, or boiled & drained) Parsley *serving size only =  cup Spinach (cooked, or boiled & drained) Swiss chard (cooked, or boiled & drained) Turnip greens (cooked, or boiled & drained)  Eat a consistent number of servings per week of foods MEDIUM-HIGH in Vitamin K (1 serving = 1 cup)  Asparagus (cooked, or boiled & drained) Broccoli (cooked, boiled & drained, or raw & chopped) Brussel sprouts (cooked, or boiled & drained) *serving size only =  cup Lettuce, raw (green leaf, endive, romaine) Spinach, raw Turnip greens, raw & chopped   These websites have more information on Coumadin (warfarin):  FailFactory.se; VeganReport.com.au;    Atrial Fibrillation Atrial fibrillation is a condition that causes your heart to beat irregularly. It may also cause your heart to beat faster than normal. Atrial fibrillation can prevent your heart from pumping blood normally. It increases your risk of stroke and heart problems. HOME CARE  Take medications as told by your doctor.  Only take medications that your doctor says are safe. Some medications can make the condition worse or happen again.  If blood thinners were prescribed by your doctor, take them exactly as told. Too much can cause bleeding. Too little and you will not have the needed protection against stroke and other problems.  Perform blood tests at home if told by your doctor.  Perform blood tests exactly as told by your doctor.  Do not drink alcohol.  Do not drink beverages with caffeine such as coffee, soda, and some teas.  Maintain a healthy weight.  Do not use diet pills unless your doctor says they are safe. They may make heart problems worse.  Follow diet instructions as told by your doctor.  Exercise regularly as told by your doctor.  Keep all follow-up appointments. GET HELP IF:  You notice a change in the speed,  rhythm, or strength of your heartbeat.  You suddenly begin peeing (urinating) more often.  You get tired more easily when moving or exercising. GET HELP RIGHT AWAY IF:   You have chest or belly (abdominal) pain.  You feel sick to your stomach (nauseous).  You are short of breath.  You suddenly have swollen feet and ankles.  You feel dizzy.  You face, arms, or legs feel numb or weak.  There is a change in your vision or speech. MAKE SURE YOU:   Understand these instructions.  Will watch your condition.  Will get help right away if you are not doing well or get worse. Document Released: 11/24/2007 Document Revised: 07/01/2013 Document Reviewed: 03/27/2012 [facility] Patient Information 2015 [place], [place]. This information is not intended to replace advice given to you by your health care provider. Make sure you discuss any questions you have with your health care provider.

## 2014-06-04 DIAGNOSIS — Z4789 Encounter for other orthopedic aftercare: Secondary | ICD-10-CM | POA: Diagnosis not present

## 2014-06-05 ENCOUNTER — Ambulatory Visit (INDEPENDENT_AMBULATORY_CARE_PROVIDER_SITE_OTHER): Payer: Medicare Other | Admitting: Family Medicine

## 2014-06-05 ENCOUNTER — Ambulatory Visit: Payer: Medicare Other | Admitting: Family Medicine

## 2014-06-05 ENCOUNTER — Encounter: Payer: Self-pay | Admitting: Family Medicine

## 2014-06-05 ENCOUNTER — Ambulatory Visit (INDEPENDENT_AMBULATORY_CARE_PROVIDER_SITE_OTHER): Payer: Medicare Other | Admitting: *Deleted

## 2014-06-05 ENCOUNTER — Other Ambulatory Visit: Payer: Self-pay | Admitting: Family Medicine

## 2014-06-05 VITALS — BP 120/76 | HR 80 | Temp 98.6°F | Resp 16

## 2014-06-05 DIAGNOSIS — E876 Hypokalemia: Secondary | ICD-10-CM

## 2014-06-05 DIAGNOSIS — I480 Paroxysmal atrial fibrillation: Secondary | ICD-10-CM

## 2014-06-05 DIAGNOSIS — N1831 Chronic kidney disease, stage 3a: Secondary | ICD-10-CM

## 2014-06-05 DIAGNOSIS — N183 Chronic kidney disease, stage 3 (moderate): Secondary | ICD-10-CM | POA: Diagnosis not present

## 2014-06-05 LAB — BASIC METABOLIC PANEL
BUN: 19 mg/dL (ref 6–23)
CALCIUM: 8.8 mg/dL (ref 8.4–10.5)
CO2: 27 mEq/L (ref 19–32)
CREATININE: 1.04 mg/dL (ref 0.50–1.10)
Chloride: 106 mEq/L (ref 96–112)
GLUCOSE: 107 mg/dL — AB (ref 70–99)
Potassium: 3.8 mEq/L (ref 3.5–5.3)
SODIUM: 143 meq/L (ref 135–145)

## 2014-06-05 LAB — MAGNESIUM

## 2014-06-05 LAB — POCT INR: INR: 1.7

## 2014-06-05 LAB — VITAMIN D 25 HYDROXY (VIT D DEFICIENCY, FRACTURES)

## 2014-06-05 LAB — VITAMIN B12: Vitamin B12 Deficiency Panel: 498

## 2014-06-05 LAB — FERRITIN: FERRITIN: 133

## 2014-06-05 MED ORDER — HYDROCORTISONE VALERATE 0.2 % EX CREA: 1.0000 "application " | TOPICAL_CREAM | Freq: Every day | CUTANEOUS | Status: DC

## 2014-06-05 MED ORDER — METOPROLOL TARTRATE 25 MG PO TABS: 25.0000 mg | ORAL_TABLET | Freq: Two times a day (BID) | ORAL | Status: DC

## 2014-06-05 MED ORDER — METOPROLOL TARTRATE 25 MG PO TABS: ORAL_TABLET | ORAL | Status: DC

## 2014-06-05 NOTE — Patient Instructions (Addendum)
If you develop chest pain or your heart racing, take a 1 tablet of short-acting metoprolol tartrate (Not the Metoprolol succinate XL 50 mg) and contact EMS  Dr Janaisha Tolsma will call you if your tests are not good. Otherwise he will send you a letter.  If you do not hear from [place] with in 1 week please call our office  Take your Warfarin (Coumadin) 3 mg on Monday, Wednesday and Friday.  Take 2 mg every other day.   Come in next Thursday to lab to have [facility] check your coumadin levels.

## 2014-06-06 ENCOUNTER — Encounter: Payer: Self-pay | Admitting: Family Medicine

## 2014-06-06 NOTE — Progress Notes (Signed)
   Subjective:    Patient ID: [name], female    DOB: 1923/12/03, 81 y.o.   MRN: [id number]  HPI  Atrial Fibrillation, paroxysmal - Longstanding issue for Mrs Flack - Patient seen 05/30/14 in [facility] ED with substernal chest pain and atrial fib with RVR. Treated with metoprolol 5 mg IV which decreased HR to < 110 and resolved chest pain but lower BP to ~ 90/50. She was admitted to Cardiology service.  She received metoprolol tartrate 12.5 mg oral every 6 hr. She converted to NSR overnite. She was restarted on her Warfarin at her home dose (3mg  M,W,F and 2 mg all other days) on 06/01/14, her discharge day.  She was sent home on her usual Metoprolol succinate XL 50 mg daily.  - Interrogation of her Pacemaker during admission revealed 6 episodes of Afib in last 6 months.  Longest episode was ~ 2.5 hours. - Since discharge home she has not had recurrence of chest pain or palpitations. No syncope or dizziness.  Her shortness of breath remains at her baseline level.  - Mrs Juncker states she now is very anxious and vigilant for return of Afib. She wants to know what could be done to stop it from recurring.   No smoking Medication list was reviewed and updated.   Review of Systems See HPI    Objective:   Physical Exam  VS reviewed GEN: Alert, Cooperative, Groomed, NAD COR: RRR, No M/G/R, No JVD, Normal PMI size and location LUNGS: BCTA, No Acc mm use, speaking in full sentences EXT: No peripheral leg edema.  Neuro: Oriented to person, place, and time Gait: Normal speed, No significant path deviation, Step through +,  Psych: Normal affect/thought/speech/language          Assessment & Plan:

## 2014-06-06 NOTE — Assessment & Plan Note (Signed)
Longstanding issue for [name] since 2010.  Recent recurrence Afib with RVR and associated SS chest pain. Interrogation Pacemaker showed 6 episodes of Afib within last 6 months.   [name] is scheduling an appointment to see Dr Linard Millers (Card) to see what might decrease the recurrence of Afib with RVR.  Her Warfarin today is 1.7 (4 days after restart of Warfarin at her usual home dose: 3mg  on M,W,F and 2 mg all other days).  Plan to continue home dose with follow up 4/14 for monitoring INR at [facility] lab.   Recommended that patient keep a faster onset metoprolol tartrate (25 mg tablet) on hand to take if palpitation occurs.  She is too still call EMS with onset of palpitations. She is to continue her metoprolol succinate 50 mg XL daily.

## 2014-06-08 DIAGNOSIS — Z9861 Coronary angioplasty status: Secondary | ICD-10-CM

## 2014-06-08 DIAGNOSIS — I2510 Atherosclerotic heart disease of native coronary artery without angina pectoris: Secondary | ICD-10-CM | POA: Insufficient documentation

## 2014-06-09 ENCOUNTER — Encounter: Payer: Self-pay | Admitting: Interventional Cardiology

## 2014-06-09 ENCOUNTER — Ambulatory Visit (INDEPENDENT_AMBULATORY_CARE_PROVIDER_SITE_OTHER): Payer: Medicare Other | Admitting: Interventional Cardiology

## 2014-06-09 VITALS — BP 130/70 | HR 61 | Ht 64.5 in | Wt 162.4 lb

## 2014-06-09 DIAGNOSIS — I480 Paroxysmal atrial fibrillation: Secondary | ICD-10-CM | POA: Diagnosis not present

## 2014-06-09 DIAGNOSIS — I495 Sick sinus syndrome: Secondary | ICD-10-CM

## 2014-06-09 DIAGNOSIS — Z7901 Long term (current) use of anticoagulants: Secondary | ICD-10-CM | POA: Diagnosis not present

## 2014-06-09 DIAGNOSIS — I2510 Atherosclerotic heart disease of native coronary artery without angina pectoris: Secondary | ICD-10-CM

## 2014-06-09 DIAGNOSIS — I10 Essential (primary) hypertension: Secondary | ICD-10-CM

## 2014-06-09 MED ORDER — AMIODARONE HCL 200 MG PO TABS: ORAL_TABLET | ORAL | Status: DC

## 2014-06-09 MED ORDER — WARFARIN SODIUM 1 MG PO TABS: 2.0000 mg | ORAL_TABLET | Freq: Every day | ORAL | Status: DC

## 2014-06-09 NOTE — Patient Instructions (Addendum)
Your physician has recommended you make the following change in your medication:  1) START Amiodarone 200mg  Twice daily for 2 weeks. THEN take 200mg  daily 2) DECREASE Coumadin to 2mg  daily  You have been referred to our Coumadin Clinic for follow up in 1 week   Your physician has requested that you have a lexiscan myoview. For further information please visit [email or web address]. Please follow instruction sheet, as given.  Your physician recommends that you schedule a follow-up appointment in: 6-8 weeks with Dr.Smith

## 2014-06-09 NOTE — Progress Notes (Signed)
Cardiology Office Note   Date:  06/09/2014   ID:  [name], DOB [birth year], MRN [id number]  PCP:  Acquanetta Sit, MD  Cardiologist:   Sinclair Grooms, MD   No chief complaint on file.     History of Present Illness: [name] is a 81 y.o. female who presents for follow-up of coronary artery disease, prior right coronary bare metal stent, paroxysmal atrial fibrillation, and sick sinus syndrome.   Today she is here for evaluation and management of recent episodes of chest discomfort, with the most recent episode associated with A. fib and rapid ventricular response. It is difficult to discern if each episode of angina has been associated with atrial fibrillation. These episodes started suddenly. The episodes are not exertional. The chest discomfort is severe and not relieved by nitroglycerin.    Past Medical History  Diagnosis Date  . Candidiasis of the esophagus 11/26/2007  . Myocardial infarct, old   . COPD, severe   . Sick sinus syndrome with tachycardia   . Pacemaker   . Chronic kidney disease (CKD), stage III (moderate)   . Hypertension   . Acute appendicitis with rupture   . Spinal stenosis, lumbar   . Adrenal adenoma     Incidentaloma  . Spondylolisthesis of lumbar region   . Lumbar herniated disc     History of HNP L4/5 in 2003  . Hx of colonoscopy with polypectomy 04/27/2010    Dr Cristina Gong found three  tubular adenomas each less than 10 mm size  . Retinal hemorrhage of left eye 06/2010  . Cataract 2013    Bilateral   . Abnormal mammogram, unspecified 08/23/2010    Followup imaging reassuring.  Repeat in 6 months.   Marland Kitchen DISC WITH RADICULOPATHY 04/27/2006    Qualifier: Diagnosis of  By: McDiarmid MD, Sherren Mocha    . Transitional cell carcinoma of ureter, history   . Mild cognitive impairment 10/26/2012    (10/25/12) Failed MiniCog screen  . 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 Normal  uric acid serum level at time of attack.    Marland Kitchen COPD (chronic obstructive pulmonary disease)   . EDEMA-LEGS,DUE TO VENOUS OBSTRUCT. 04/27/2006    Qualifier: Diagnosis of  By: McDiarmid MD, Sherren Mocha    . ANXIETY 04/27/2006    Qualifier: Diagnosis of  By: McDiarmid MD, Sherren Mocha    . HERNIA, HIATAL, NONCONGENITAL 04/27/2006    Qualifier: Diagnosis of  By: McDiarmid MD, Sherren Mocha    . History of Hemorrhoids 04/27/2006    Qualifier: Diagnosis of  By: McDiarmid MD, Sherren Mocha    . RHINITIS, ALLERGIC 04/27/2006    Qualifier: Diagnosis of  By: McDiarmid MD, Sherren Mocha    . SCHATZKI'S RING, HX OF 11/26/2007    Qualifier: Diagnosis of  By: McDiarmid MD, Sherren Mocha  An EGD was performed by Dr Cristina Gong on 04/27/2010 for iron deficiency anemia. There was a a transient hiatal hernia with Schatzki's ring. Stomach and duodenum were normal. EGD on 03/06/12 by Dr Cristina Gong for IDA non-obstructing Schatzki's ring at Gastroesophageal junction, otherwise normal esophagus and stomach.    . Urge incontinence 12/13/2011    Diagnosed in 10/2011 by Dr Bjorn Loser (Urology)   . VITAMIN B12 DEFICIENCY 10/07/2009    Qualifier: Diagnosis of  By: McDiarmid MD, Sherren Mocha  Dx based on a post-TKR anemia work-up Low normal serum B12 with high Methylmalonic acid and homocysteine level  Vit B12 serum level (10/28/10) > 1500 pg/mL   . Vitamin D deficiency 11/02/2010    Serum vitamin D 25(OH) = 10.9 ng/mL (30 -100) on 10/28/10 c/w Vitamin D deficiency.     . AF (paroxysmal atrial fibrillation) 11/11/2010    Hospitalization (9/8-9/10, Dr Daneen Schick, III, Cardiology) for Paroxysmal Atrial Fibrillation with RVR and anginal pain secondary to demand/supply mismatch in setting of RVR with known circumflex artery branch disease.    Marland Kitchen HYPERTENSION, BENIGN ESSENTIAL 06/18/2007    Qualifier: Diagnosis of  By: McDiarmid MD, Sherren Mocha    . Iron deficiency anemia 08/05/2010    Dr Cristina Gong (GI) has evaluated with EGD, colonoscopy, and video capsular endoscopy in 2011 & 2012.  All have been unrevealing  as to an origin of IDA.  OV with Dr Cristina Gong (10/28/10) assessment of blood in stool per hemoccult and GER. Hbg 12.1 g/dL, MCV 91.8, Ferritin 30 ng/mL. Patient taking on ferrous sulfate tab daily.   EGD on 03/06/12 by Dr Cristina Gong for IDA non-obstructing Schatzki's ring at Gastroesophageal junction, otherwise normal esophagus and stomach.     . Macular degeneration, bilateral 10/04/2010    Right eye is wet MD, the other is dry macular degeneration (ARMD). Pt undergoing some form of vascular endothelial growth factor inhibition intraocular therapy.    . VENTRICULAR HYPERTROPHY, LEFT 08/28/2008    Qualifier: Diagnosis of  By: McDiarmid MD, Sherren Mocha    . CHRONIC KIDNEY DISEASE STAGE III (MODERATE) 09/02/2009    Patient with Solitary Kidney S/P total Nephrectomy for transitional cell caner.    Marland Kitchen COPD 04/27/2006    Qualifier: Diagnosis of  By: McDiarmid MD, Sherren Mocha    . MYOCARDIAL INFARCTION, HX OF 09/25/2008    Qualifier: Diagnosis of  By: McDiarmid MD, Todd  Hospitalized for NSTEMI ( 10/02/2008). PCI (10/02/2008, Dr Daneen Schick) with RCA Barber placed: 99% lesion to 0% lesion. Circumflex Obtuse marginal with 90% stenosis (left for medical mangement for now)   Hospitalization (9/8-9/10, Dr Daneen Schick, III, Cardiology) for Paroxysmal Atrial Fibrillation with RVR and anginal pain secondary to demand/supply mismatch in setting of RVR with known circumflex artery branch disease.    Marland Kitchen SICK SINUS SYNDROME 04/27/2006    Qualifier: Diagnosis of  By: McDiarmid MD, Sherren Mocha  S/P dual chamber pacemaker placement by Dr Osie Cheeks ([facility]) with pacemaker lead extraction & reinsertion - 07/25/2003  Hospitalization (9/8-9/10, Dr Daneen Schick, III, Cardiology) for Paroxysmal Atrial Fibrillation with RVR and anginal pain secondary to demand/supply mismatch in setting of RVR with known circumflex artery branch disease.    . Solitary kidney, acquired 05/17/2010    Surgical removal for transitional cell cancer by Tresa Endo, MD ([facility]).  Surveillance cystoscopy by Dr Alinda Money [facility]) on 10/19/12 without evidence of cystoscopic recurrence. Recommend RTC one year for cystoscopy.   . ADENOMATOUS COLONIC POLYP 03/01/2003    Qualifier: Diagnosis of  By: McDiarmid MD, Sherren Mocha Multiple benign polyps of cecum, ascending, transverse and sigmoid colon by 8/09 colonoscopy by Dr Cristina Gong  Colonoscopy by Dr Cristina Gong for iron-deficiency anemia on 04/27/2010 showed three sessile polyps that were in ascending (3 mm x 9 mm), transverse (4 mm), and cecum (3 mm).  All three were tubular adenomas that were negative for high grade dysplasia or malignancy on pathology. Dr Cristina Gong called the polpys benign and not requiring follow-up in view of the patients age.    . Soft tissue injury of foot 05/03/2011  . PREDIABETES 09/11/2007    Qualifier: Diagnosis of  By: McDiarmid  MD, Sherren Mocha    . Numbness and tingling in hands 07/21/2011  . MUSCLE CRAMPS 03/11/2010    Qualifier: Diagnosis of  By: McDiarmid MD, Sherren Mocha    . Mammogram abnormal 02/15/2011  . LUMBAR SPINAL STENOSIS 04/27/2006    Qualifier: Diagnosis of  By: McDiarmid MD, Francisca December HNP w/ L4&5 root encroachment - 05/24/2001,   Spinal Stenosis: L-S MRI: L4-5 Spinal stenosis, - 03/03/2006, Spondylolithesis L5-S1(mild), DJD L4-5 on X-Ray 11/04   . Leg cramps 05/29/2012  . 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 Normal uric acid serum level at time of attack.    . Cellulitis of leg, right 10/01/2012  . Solar lentigo 06/15/2012  . Muscle spasm of back 09/24/2013  . 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  No mass lesion seen. Likely cause of patients 2 to 3 episodes of bleeding from patient's right eye.      Past Surgical History   Procedure Laterality Date  . Pacemaker insertion      Dr Lovena Le ([facility])  . Nephrectomy  For transition cell cancer     Dr Tresa Endo, surgeon  . Replacement total knee  2009, Right knee    Dr Wynelle Link  . Replacement total knee  05/2009, Left knee    Dr Wynelle Link  . Knee arthroscopy w/ synovectomy  11/2009, left knee    Dr Wynelle Link  . Cholecystectomy    . Esophagogastroduodenoscopy  04/27/2010    Dr Cristina Gong - found transient H/H & Schatzki's ring  . Esophagogastroduodenoscopy endoscopy  03/06/2012    Dr Cristina Gong - found non-obstuctive Schatzki's ring at [employer] [facility] o/w normal EGD.   Marland Kitchen Pacemaker generator change N/A 11/12/2012    Procedure: PACEMAKER GENERATOR CHANGE;  Surgeon: Evans Lance, MD;  Location: [facility] CATH LAB;  Service: Cardiovascular;  Laterality: N/A;  . Tonsillectomy    . Breast surgery      breast reduction  . Eye surgery      bilateral cataracts  . Trigger finger release Right 05/22/2014    Procedure: RIGHT HAND A-1 PULLEY RELEASE ;  Surgeon: Roseanne Kaufman, MD;  Location: [facility];  Service: Orthopedics;  Laterality: Right;  . Dupuytren contracture release Right 05/22/2014    Procedure: DUPUYTREN RELEASE AND REPAIR AS NECESSARY RIGHT RING FINGER AND MIDDLE FINGER;  Surgeon: Roseanne Kaufman, MD;  Location: [facility];  Service: Orthopedics;  Laterality: Right;     Current Outpatient Prescriptions  Medication Sig Dispense Refill  . ADVAIR DISKUS 250-50 MCG/DOSE AEPB Inhale 2 puffs into the lungs 2 (two) times daily.     Marland Kitchen albuterol (VENTOLIN HFA) 108 (90 BASE) MCG/ACT inhaler Inhale 2 puffs into the lungs every 6 (six) hours as needed. If needed for shortness of breath. 1 Inhaler 3  . B Complex Vitamins (VITAMIN B COMPLEX PO) Take 1 tablet by mouth daily.    . cholecalciferol (VITAMIN D) 1000 UNITS tablet Take 1,000 Units by mouth daily.    . furosemide (LASIX) 40 MG tablet Take 40 mg by mouth once a week. 1 tablet once a week    . hydrocortisone valerate cream (WESTCORT) 0.2 %  Apply 1 application topically daily. 45 g 1  . LORazepam (ATIVAN) 0.5 MG tablet TAKE 1  TABLET BY MOUTH EVERY 8 HOURS AS NEEDED FOR ANXIETY 30 tablet 1  . metoprolol succinate (TOPROL-XL) 50 MG 24 hr tablet Take 1 tablet (50 mg total) by mouth daily. with food 90 tablet 3  . metoprolol tartrate (LOPRESSOR) 25 MG tablet Take one tablet if heart racing or chest pain then call EMS 30 tablet 2  . NITROSTAT 0.4 MG SL tablet PLACE ONE TABLET UNDER THE TONGUE EVERY FIVE MINUTES AS NEEDED. (Patient taking differently: PLACE ONE TABLET UNDER THE TONGUE EVERY FIVE MINUTES AS NEEDED FOR CHEST PAIN.) 25 tablet 3  . omeprazole (PRILOSEC) 40 MG capsule TAKE 1 CAPSULE EVERY DAY. 90 capsule 2  . simvastatin (ZOCOR) 40 MG tablet TAKE 1 TABLET BY MOUTH EVERY MORNING 90 tablet 3  . tiotropium (SPIRIVA) 18 MCG inhalation capsule Place 1 capsule (18 mcg total) into inhaler and inhale daily. 30 capsule PRN  . traMADol (ULTRAM) 50 MG tablet Take 1 tablet (50 mg total) by mouth every 6 (six) hours as needed for moderate pain. 30 tablet 1  . vitamin B-12 (CYANOCOBALAMIN) 1000 MCG tablet Take 1,000 mcg by mouth daily.     Marland Kitchen warfarin (COUMADIN) 1 MG tablet Take 2 tablets (2 mg total) by mouth daily.    Marland Kitchen amiodarone (PACERONE) 200 MG tablet Take 1 Tab(200mg ) Twice daily for 2 weeks. Then Take 1Tab(200mg ) daily. 90 tablet 3  . azithromycin (ZITHROMAX) 250 MG tablet Take 250 mg by mouth. Take as needed for infection     No current facility-administered medications for this visit.    Allergies:   Baclofen; Codeine phosphate; Hydrochlorothiazide; and Montelukast sodium    Social History:  The patient  reports that she has quit smoking. Her smoking use included Cigarettes. She has never used smokeless tobacco. She reports that she drinks about 12.0 oz of alcohol per week. She reports that she does not use illicit drugs.   Family History:  The patient's family history includes Dementia in her mother; Heart disease in her father.     ROS:  Please see the history of present illness.   Otherwise, review of systems are positive for difficulty with memory. No bleeding on warfarin..  Pacemaker power source migrates when she sleeps on her left side. The pacemaker source is in the right subclavicular area. All other systems are reviewed and negative.    PHYSICAL EXAM: VS:  BP 130/70 mmHg  Pulse 61  Ht 5' 4.5" (1.638 m)  Wt 162 lb 6.4 oz (73.664 kg)  BMI 27.46 kg/m2 , BMI Body mass index is 27.46 kg/(m^2). GEN: Well nourished, well developed, in no acute distress HEENT: normal Neck: no JVD, carotid bruits, or masses Cardiac: RRR; no murmurs, rubs, or gallops,no edema  Respiratory:  clear to auscultation bilaterally, normal work of breathing GI: soft, nontender, nondistended, + BS MS: no deformity or atrophy Skin: warm and dry, no rash Neuro:  Strength and sensation are intact Psych: euthymic mood, full affect   EKG:  EKG is ordered today. The ekg ordered today demonstrates atrial paced rhythm at 61 bpm. LVH with strain.   Recent Labs: 05/08/2014: ALT 9 05/30/2014: B Natriuretic Peptide 669.6* 06/01/2014: Hemoglobin 10.1*; Platelets 258 06/05/2014: BUN 19; Creatinine 1.04; Potassium 3.8; Sodium 143    Lipid Panel    Component Value Date/Time   CHOL 179 05/20/2013 1604   TRIG 242* 05/20/2013 1604   HDL 38* 05/20/2013 1604   CHOLHDL 4.7 05/20/2013 1604   VLDL 48* 05/20/2013 1604   LDLCALC 93 05/20/2013 1604  LDLDIRECT 74 05/08/2014 1110      Wt Readings from Last 3 Encounters:  06/09/14 162 lb 6.4 oz (73.664 kg)  05/31/14 158 lb 14.5 oz (72.08 kg)  05/22/14 162 lb 9.6 oz (73.755 kg)      Other studies Reviewed: Additional studies/ records that were reviewed today include: Reviewed prior cardiac. Review of the above records demonstrates: Most recent evaluations have been for atrial fibrillation with associated chest discomfort   ASSESSMENT AND PLAN:  Paroxysmal atrial fibrillation: Paroxysmal atrial  fibrillation despite current medical regimen  Chronic anticoagulation: Since wrist starting amiodarone we will decrease Coumadin to 2 mg daily. I will her to be followed and oxygen and clinic for the next 4-6 weeks until she is stable on amiodarone dose.  HYPERTENSION, BENIGN ESSENTIAL  SICK SINUS SYNDROME: Pacemaker and beta blocker therapy currently being used.  Coronary artery disease involving native coronary artery of native heart without angina pectoris: Angina usually during episodes of atrial fibrillation     Current medicines are reviewed at length with the patient today.  The patient has concerns regarding medicines.  The following changes have been made:  Because of recurrent atrial fibrillation I will add amiodarone 400 mg per day for 2 weeks and then decreased 200 mg daily thereafter. I will adjust her Coumadin dose down to 2 mg per day. She will need follow-up in the Coumadin clinic in one week and continuous follow-up until her amiodarone dose is stable.  We'll also perform a myocardial perfusion study with Lexiscan pharmacologic stress to rule out restenosis or progression of coronary disease causing angina when she has atrial fibrillation. At her age I will like to be conservative with reference to an invasive procedures unless the nuclear study is high risk. She is not having angina on low she has A. fib.  Labs/ tests ordered today include:   Orders Placed This Encounter  Procedures  . Myocardial Perfusion Imaging  . EKG 12-Lead     Disposition:   FU with Linard Millers in 6 weeks   Signed, Sinclair Grooms, MD  06/09/2014 11:12 AM    [address], [place], [place]  [address] Phone: [phone]; Fax: [phone]

## 2014-06-11 ENCOUNTER — Telehealth: Payer: Self-pay | Admitting: Interventional Cardiology

## 2014-06-11 NOTE — Telephone Encounter (Signed)
Pt pharmacy (spk with [facility]) aware. Pt should be prescribed Coumadin and Wafarin

## 2014-06-11 NOTE — Telephone Encounter (Signed)
New message      Calling about a drug interaction between amiodarone and warfarin.  We called in amiodarone recently.

## 2014-06-12 ENCOUNTER — Ambulatory Visit: Payer: Medicare Other

## 2014-06-12 ENCOUNTER — Encounter: Payer: Self-pay | Admitting: Family Medicine

## 2014-06-12 NOTE — Progress Notes (Signed)
[name] came today to the [facility] lab for her previously scheduled INR check.  Pt expressed concern why she did not have an appt scheduled "because my warfarin calendar has for me to come today".   (The appt had been cancelled by Dr. Thompson Caul office.)  I read Dr. Thompson Caul note and saw pt was to have started amiodarone on 06-09-14 and was to decrease warfarin to 2 mg daily and recheck INR  In 1 week at the [facility].  The patient was totally unaware - did not remember any of this and said no one told her any of this.  She stated no one gave her the discharge instructions, no one told her about starting a new medicine or to go pick it up at the pharmacy, no one told her to decrease her warfarin.  I printed the discharge instructions from Dr. [name] and wrote the same instructions to go get the amiodarone and decrease warfarin to 2 mg daily and to call the Heart coumadin office and make an appt for INR check.   Very concerned that pt may not be remembering clearly.

## 2014-06-12 NOTE — Telephone Encounter (Signed)
Called pt to schedule her cvrr appt for next week, lmtcb

## 2014-06-19 ENCOUNTER — Ambulatory Visit (INDEPENDENT_AMBULATORY_CARE_PROVIDER_SITE_OTHER): Payer: Medicare Other

## 2014-06-19 ENCOUNTER — Telehealth: Payer: Self-pay | Admitting: Interventional Cardiology

## 2014-06-19 DIAGNOSIS — Z5181 Encounter for therapeutic drug level monitoring: Secondary | ICD-10-CM

## 2014-06-19 DIAGNOSIS — I480 Paroxysmal atrial fibrillation: Secondary | ICD-10-CM | POA: Diagnosis not present

## 2014-06-19 LAB — POCT INR: INR: 3.6

## 2014-06-19 NOTE — Telephone Encounter (Signed)
New Prob    Pt has questions regarding her next appointment and if she is cleared to drive to and from her appointments. Please call.

## 2014-06-19 NOTE — Telephone Encounter (Signed)
Returned pt call.  Pt wanted to know if it is ok for her to drive herself to an from her myoview appt Adv her it is ok for her to drive. She verbalized understanding.

## 2014-06-23 ENCOUNTER — Ambulatory Visit (HOSPITAL_COMMUNITY): Payer: Medicare Other | Attending: Cardiology | Admitting: Radiology

## 2014-06-23 DIAGNOSIS — Z955 Presence of coronary angioplasty implant and graft: Secondary | ICD-10-CM | POA: Insufficient documentation

## 2014-06-23 DIAGNOSIS — R0609 Other forms of dyspnea: Secondary | ICD-10-CM | POA: Diagnosis not present

## 2014-06-23 DIAGNOSIS — J449 Chronic obstructive pulmonary disease, unspecified: Secondary | ICD-10-CM | POA: Diagnosis not present

## 2014-06-23 DIAGNOSIS — I4891 Unspecified atrial fibrillation: Secondary | ICD-10-CM | POA: Insufficient documentation

## 2014-06-23 DIAGNOSIS — I2510 Atherosclerotic heart disease of native coronary artery without angina pectoris: Secondary | ICD-10-CM | POA: Insufficient documentation

## 2014-06-23 DIAGNOSIS — R002 Palpitations: Secondary | ICD-10-CM | POA: Diagnosis not present

## 2014-06-23 DIAGNOSIS — R079 Chest pain, unspecified: Secondary | ICD-10-CM | POA: Diagnosis not present

## 2014-06-23 DIAGNOSIS — I10 Essential (primary) hypertension: Secondary | ICD-10-CM | POA: Insufficient documentation

## 2014-06-23 MED ORDER — TECHNETIUM TC 99M SESTAMIBI GENERIC - CARDIOLITE
10.0000 | Freq: Once | INTRAVENOUS | Status: AC | PRN
  Administered 2014-06-23: 10 via INTRAVENOUS

## 2014-06-23 MED ORDER — TECHNETIUM TC 99M SESTAMIBI GENERIC - CARDIOLITE
30.0000 | Freq: Once | INTRAVENOUS | Status: AC | PRN
  Administered 2014-06-23: 30 via INTRAVENOUS

## 2014-06-23 MED ORDER — REGADENOSON 0.4 MG/5ML IV SOLN
0.4000 mg | Freq: Once | INTRAVENOUS | Status: AC
  Administered 2014-06-23: 0.4 mg via INTRAVENOUS

## 2014-06-23 NOTE — Progress Notes (Signed)
[facility] [address] [place], [place] [address] [phone]    Cardiology Nuclear Med Study  [name] is a 81 y.o. female     MRN : [id number]     DOB: 17-May-1923  Procedure Date: 06/23/2014  Nuclear Med Background Indication for Stress Test:  Evaluation for Ischemia History:  COPD, CAD-Stent '08 MPI: EF: 69% PTVP: AFIB-SSS Cardiac Risk Factors: Hypertension and Lipids  Symptoms:  Chest Pain, DOE, Palpitations and SOB   Nuclear Pre-Procedure Caffeine/Decaff Intake:  None NPO After: 7:00pm   Lungs:  clear O2 Sat: 95% on room air. IV 0.9% NS with Angio Cath:  22g  IV Site: R Forearm  IV Started by:  Matilde Haymaker, RN  Chest Size (in):  36 Cup Size: D  Height: 5\' 4"  (1.626 m)  Weight:  159 lb (72.122 kg)  BMI:  Body mass index is 27.28 kg/(m^2). Tech Comments:  Toprol taken at 0900 today    Nuclear Med Study 1 or 2 day study: 1 day  Stress Test Type:  Lexiscan  Reading MD: n/a  Order Authorizing Provider:  Wyvonnia Lora  Resting Radionuclide: Technetium 39m Sestamibi  Resting Radionuclide Dose: 11.0 mCi   Stress Radionuclide:  Technetium 67m Sestamibi  Stress Radionuclide Dose: 33.0 mCi           Stress Protocol Rest HR: 61 Stress HR: 65  Rest BP: 122/72 Stress BP: 118/69  Exercise Time (min): n/a METS: n/a   Predicted Max HR: 132 bpm % Max HR: 49.24 bpm Rate Pressure Product: 7930   Dose of Adenosine (mg):  n/a Dose of Lexiscan: 0.4 mg  Dose of Atropine (mg): n/a Dose of Dobutamine: n/a mcg/kg/min (at max HR)  Stress Test Technologist: Perrin Maltese, EMT-P  Nuclear Technologist:  Annye Rusk, CNMT     Rest Procedure:  Myocardial perfusion imaging was performed at rest 45 minutes following the intravenous administration of Technetium 34m Sestamibi. Rest ECG: Possible junctional rhythm, septal MI, inferior lateral TWI.  Stress Procedure:  The patient received IV Lexiscan 0.4 mg over 15-seconds.  Technetium 40m  Sestamibi injected at 30-seconds. This patient was sob, felt funny, and nausea with the Lexiscan injection. Quantitative spect images were obtained after a 45 minute delay. Stress ECG: No significant ST segment change suggestive of ischemia.  QPS Raw Data Images:  Acquisition technically good; normal left ventricular size. Stress Images:  There is decreased uptake in the anterior wall. Rest Images:  There is decreased uptake in the anterior wall, less prominent compared to the stress images. Subtraction (SDS):  These findings are consistent with ischemia. Transient Ischemic Dilatation (Normal <1.22):  1.23 Lung/Heart Ratio (Normal <0.45):  0.17  Quantitative Gated Spect Images QGS EDV:  99 ml QGS ESV:  38 ml  Impression Exercise Capacity:  Lexiscan with no exercise. BP Response:  Normal blood pressure response. Clinical Symptoms:  There is dyspnea. ECG Impression:  No significant ST segment change suggestive of ischemia. Comparison with Prior Nuclear Study: Compared to 12/20/06, anterior ischemia new.  Overall Impression:  Low risk stress nuclear study with a small, moderate intensity, partially reversible anterior defect consistent with soft tissue attenuation and mild ischemia.  LV Ejection Fraction: 61%.  LV Wall Motion:  Normal Wall Motion  Kirk Ruths

## 2014-06-26 ENCOUNTER — Telehealth: Payer: Self-pay

## 2014-06-26 ENCOUNTER — Ambulatory Visit (INDEPENDENT_AMBULATORY_CARE_PROVIDER_SITE_OTHER): Payer: Medicare Other | Admitting: *Deleted

## 2014-06-26 DIAGNOSIS — I480 Paroxysmal atrial fibrillation: Secondary | ICD-10-CM | POA: Diagnosis not present

## 2014-06-26 DIAGNOSIS — Z5181 Encounter for therapeutic drug level monitoring: Secondary | ICD-10-CM

## 2014-06-26 LAB — POCT INR: INR: 4.2

## 2014-06-26 NOTE — Telephone Encounter (Signed)
Pt aware of myoview results.  Stress test is low risk  Pt verbalized understanding.

## 2014-06-26 NOTE — Telephone Encounter (Signed)
-----   Message from Belva Crome, MD sent at 06/24/2014  8:45 AM EDT ----- Stress test is low risk.

## 2014-07-01 ENCOUNTER — Encounter: Payer: Medicare Other | Admitting: Interventional Cardiology

## 2014-07-02 ENCOUNTER — Other Ambulatory Visit: Payer: Self-pay | Admitting: Family Medicine

## 2014-07-02 DIAGNOSIS — Z4789 Encounter for other orthopedic aftercare: Secondary | ICD-10-CM | POA: Diagnosis not present

## 2014-07-03 ENCOUNTER — Ambulatory Visit (INDEPENDENT_AMBULATORY_CARE_PROVIDER_SITE_OTHER): Payer: Medicare Other | Admitting: *Deleted

## 2014-07-03 DIAGNOSIS — Z5181 Encounter for therapeutic drug level monitoring: Secondary | ICD-10-CM | POA: Diagnosis not present

## 2014-07-03 DIAGNOSIS — I480 Paroxysmal atrial fibrillation: Secondary | ICD-10-CM | POA: Diagnosis not present

## 2014-07-03 LAB — POCT INR: INR: 2.1

## 2014-07-04 ENCOUNTER — Other Ambulatory Visit: Payer: Self-pay | Admitting: Family Medicine

## 2014-07-08 ENCOUNTER — Encounter: Payer: Medicare Other | Admitting: *Deleted

## 2014-07-08 ENCOUNTER — Telehealth: Payer: Self-pay | Admitting: Cardiology

## 2014-07-08 NOTE — Telephone Encounter (Signed)
Confirmed remote transmission w/ pt daughter.   

## 2014-07-11 ENCOUNTER — Encounter: Payer: Self-pay | Admitting: Cardiology

## 2014-07-17 ENCOUNTER — Ambulatory Visit (INDEPENDENT_AMBULATORY_CARE_PROVIDER_SITE_OTHER): Payer: Medicare Other

## 2014-07-17 DIAGNOSIS — I480 Paroxysmal atrial fibrillation: Secondary | ICD-10-CM

## 2014-07-17 DIAGNOSIS — Z5181 Encounter for therapeutic drug level monitoring: Secondary | ICD-10-CM

## 2014-07-17 DIAGNOSIS — H3532 Exudative age-related macular degeneration: Secondary | ICD-10-CM | POA: Diagnosis not present

## 2014-07-17 LAB — POCT INR: INR: 4.1

## 2014-07-22 ENCOUNTER — Encounter: Payer: Self-pay | Admitting: *Deleted

## 2014-07-22 DIAGNOSIS — I252 Old myocardial infarction: Secondary | ICD-10-CM

## 2014-07-22 DIAGNOSIS — H3532 Exudative age-related macular degeneration: Secondary | ICD-10-CM | POA: Diagnosis not present

## 2014-07-22 DIAGNOSIS — E7800 Pure hypercholesterolemia, unspecified: Secondary | ICD-10-CM

## 2014-07-22 DIAGNOSIS — I2510 Atherosclerotic heart disease of native coronary artery without angina pectoris: Secondary | ICD-10-CM

## 2014-07-22 DIAGNOSIS — Z9189 Other specified personal risk factors, not elsewhere classified: Secondary | ICD-10-CM

## 2014-07-22 NOTE — Progress Notes (Signed)
Prior Authorization received from [facility] for simvastatin. Simvastatin is covered but has a quantity limit of 30 tablets per 30 days.  PA form placed in provider box for review.  Derl Barrow, RN

## 2014-07-23 ENCOUNTER — Ambulatory Visit (INDEPENDENT_AMBULATORY_CARE_PROVIDER_SITE_OTHER): Payer: Medicare Other | Admitting: *Deleted

## 2014-07-23 DIAGNOSIS — I480 Paroxysmal atrial fibrillation: Secondary | ICD-10-CM

## 2014-07-23 DIAGNOSIS — Z5181 Encounter for therapeutic drug level monitoring: Secondary | ICD-10-CM

## 2014-07-23 LAB — POCT INR: INR: 3.8

## 2014-07-24 NOTE — Progress Notes (Signed)
PA form for Simvastatin completed and given to Latina Craver, RN.

## 2014-07-24 NOTE — Progress Notes (Signed)
PA form faxed to [facility] for review.  Derl Barrow, RN

## 2014-07-30 ENCOUNTER — Emergency Department (HOSPITAL_COMMUNITY): Payer: Medicare Other

## 2014-07-30 ENCOUNTER — Encounter (HOSPITAL_COMMUNITY): Payer: Self-pay | Admitting: *Deleted

## 2014-07-30 ENCOUNTER — Emergency Department (HOSPITAL_COMMUNITY)
Admission: EM | Admit: 2014-07-30 | Discharge: 2014-07-30 | Disposition: A | Discharge status: Home / Self Care | Payer: Medicare Other | Attending: Emergency Medicine | Admitting: Emergency Medicine

## 2014-07-30 DIAGNOSIS — J449 Chronic obstructive pulmonary disease, unspecified: Secondary | ICD-10-CM | POA: Insufficient documentation

## 2014-07-30 DIAGNOSIS — Z79899 Other long term (current) drug therapy: Secondary | ICD-10-CM | POA: Insufficient documentation

## 2014-07-30 DIAGNOSIS — Z905 Acquired absence of kidney: Secondary | ICD-10-CM | POA: Insufficient documentation

## 2014-07-30 DIAGNOSIS — Z872 Personal history of diseases of the skin and subcutaneous tissue: Secondary | ICD-10-CM | POA: Diagnosis not present

## 2014-07-30 DIAGNOSIS — Z86018 Personal history of other benign neoplasm: Secondary | ICD-10-CM | POA: Insufficient documentation

## 2014-07-30 DIAGNOSIS — I129 Hypertensive chronic kidney disease with stage 1 through stage 4 chronic kidney disease, or unspecified chronic kidney disease: Secondary | ICD-10-CM | POA: Diagnosis not present

## 2014-07-30 DIAGNOSIS — Z8619 Personal history of other infectious and parasitic diseases: Secondary | ICD-10-CM | POA: Insufficient documentation

## 2014-07-30 DIAGNOSIS — Y998 Other external cause status: Secondary | ICD-10-CM | POA: Insufficient documentation

## 2014-07-30 DIAGNOSIS — I252 Old myocardial infarction: Secondary | ICD-10-CM | POA: Insufficient documentation

## 2014-07-30 DIAGNOSIS — Z950 Presence of cardiac pacemaker: Secondary | ICD-10-CM | POA: Insufficient documentation

## 2014-07-30 DIAGNOSIS — Z8739 Personal history of other diseases of the musculoskeletal system and connective tissue: Secondary | ICD-10-CM | POA: Insufficient documentation

## 2014-07-30 DIAGNOSIS — F419 Anxiety disorder, unspecified: Secondary | ICD-10-CM | POA: Insufficient documentation

## 2014-07-30 DIAGNOSIS — Z8554 Personal history of malignant neoplasm of ureter: Secondary | ICD-10-CM | POA: Insufficient documentation

## 2014-07-30 DIAGNOSIS — Z9889 Other specified postprocedural states: Secondary | ICD-10-CM | POA: Diagnosis not present

## 2014-07-30 DIAGNOSIS — W01198A Fall on same level from slipping, tripping and stumbling with subsequent striking against other object, initial encounter: Secondary | ICD-10-CM | POA: Insufficient documentation

## 2014-07-30 DIAGNOSIS — Z87891 Personal history of nicotine dependence: Secondary | ICD-10-CM | POA: Diagnosis not present

## 2014-07-30 DIAGNOSIS — S0990XA Unspecified injury of head, initial encounter: Secondary | ICD-10-CM | POA: Diagnosis present

## 2014-07-30 DIAGNOSIS — E559 Vitamin D deficiency, unspecified: Secondary | ICD-10-CM | POA: Diagnosis not present

## 2014-07-30 DIAGNOSIS — I480 Paroxysmal atrial fibrillation: Secondary | ICD-10-CM | POA: Insufficient documentation

## 2014-07-30 DIAGNOSIS — S0012XA Contusion of left eyelid and periocular area, initial encounter: Secondary | ICD-10-CM | POA: Diagnosis not present

## 2014-07-30 DIAGNOSIS — N183 Chronic kidney disease, stage 3 (moderate): Secondary | ICD-10-CM | POA: Diagnosis not present

## 2014-07-30 DIAGNOSIS — S81011A Laceration without foreign body, right knee, initial encounter: Secondary | ICD-10-CM | POA: Diagnosis not present

## 2014-07-30 DIAGNOSIS — Z86010 Personal history of colonic polyps: Secondary | ICD-10-CM | POA: Insufficient documentation

## 2014-07-30 DIAGNOSIS — Z8719 Personal history of other diseases of the digestive system: Secondary | ICD-10-CM | POA: Diagnosis not present

## 2014-07-30 DIAGNOSIS — Z7901 Long term (current) use of anticoagulants: Secondary | ICD-10-CM | POA: Diagnosis not present

## 2014-07-30 DIAGNOSIS — S40012A Contusion of left shoulder, initial encounter: Secondary | ICD-10-CM | POA: Diagnosis not present

## 2014-07-30 DIAGNOSIS — S41112A Laceration without foreign body of left upper arm, initial encounter: Secondary | ICD-10-CM | POA: Diagnosis not present

## 2014-07-30 DIAGNOSIS — E538 Deficiency of other specified B group vitamins: Secondary | ICD-10-CM | POA: Insufficient documentation

## 2014-07-30 DIAGNOSIS — Y9389 Activity, other specified: Secondary | ICD-10-CM | POA: Diagnosis not present

## 2014-07-30 DIAGNOSIS — Z8669 Personal history of other diseases of the nervous system and sense organs: Secondary | ICD-10-CM | POA: Diagnosis not present

## 2014-07-30 DIAGNOSIS — S0511XA Contusion of eyeball and orbital tissues, right eye, initial encounter: Secondary | ICD-10-CM | POA: Diagnosis not present

## 2014-07-30 DIAGNOSIS — M25512 Pain in left shoulder: Secondary | ICD-10-CM | POA: Diagnosis not present

## 2014-07-30 DIAGNOSIS — Y9289 Other specified places as the place of occurrence of the external cause: Secondary | ICD-10-CM | POA: Diagnosis not present

## 2014-07-30 DIAGNOSIS — W19XXXA Unspecified fall, initial encounter: Secondary | ICD-10-CM

## 2014-07-30 DIAGNOSIS — Z862 Personal history of diseases of the blood and blood-forming organs and certain disorders involving the immune mechanism: Secondary | ICD-10-CM | POA: Insufficient documentation

## 2014-07-30 DIAGNOSIS — H05232 Hemorrhage of left orbit: Secondary | ICD-10-CM

## 2014-07-30 LAB — PROTIME-INR
INR: 1.9 — ABNORMAL HIGH (ref 0.00–1.49)
Prothrombin Time: 21.7 seconds — ABNORMAL HIGH (ref 11.6–15.2)

## 2014-07-30 LAB — I-STAT CHEM 8, ED
BUN: 25 mg/dL — ABNORMAL HIGH (ref 6–20)
Calcium, Ion: 1.12 mmol/L — ABNORMAL LOW (ref 1.13–1.30)
Chloride: 103 mmol/L (ref 101–111)
Creatinine, Ser: 1.2 mg/dL — ABNORMAL HIGH (ref 0.44–1.00)
Glucose, Bld: 115 mg/dL — ABNORMAL HIGH (ref 65–99)
HEMATOCRIT: 33 % — AB (ref 36.0–46.0)
HEMOGLOBIN: 11.2 g/dL — AB (ref 12.0–15.0)
Potassium: 4.2 mmol/L (ref 3.5–5.1)
Sodium: 139 mmol/L (ref 135–145)
TCO2: 21 mmol/L (ref 0–100)

## 2014-07-30 MED ORDER — BACITRACIN 500 UNIT/GM EX OINT
1.0000 | TOPICAL_OINTMENT | Freq: Once | CUTANEOUS | Status: AC
Start: 2014-07-30 — End: 2014-07-30
  Administered 2014-07-30: 1 via TOPICAL
  Filled 2014-07-30: qty 28

## 2014-07-30 MED ORDER — OXYCODONE-ACETAMINOPHEN 5-325 MG PO TABS
2.0000 | ORAL_TABLET | Freq: Once | ORAL | Status: AC
  Administered 2014-07-30: 2 via ORAL
  Filled 2014-07-30: qty 2

## 2014-07-30 MED ORDER — OXYCODONE-ACETAMINOPHEN 5-325 MG PO TABS: 1.0000 | ORAL_TABLET | ORAL | Status: DC | PRN

## 2014-07-30 NOTE — Progress Notes (Signed)
PA for Simvastatin wad denied from [facility] due to safety concerns.  There is a drug-drug interaction between Simvastatin and Amiodarone.  [facility] is aware.  Derl Barrow, RN

## 2014-07-30 NOTE — ED Notes (Signed)
Pt given water per request after cleared with RN.

## 2014-07-30 NOTE — ED Notes (Signed)
Pt taken to CT.

## 2014-07-30 NOTE — ED Notes (Signed)
Pt states she tripped on her curling iron cord and fell and hit her head on the door. Large ecchymosis with swelling noted to left eyebrow. Skin tear to left arm. Pt reports that she takes coumadin.

## 2014-07-30 NOTE — ED Provider Notes (Signed)
CSN: [id number]     Arrival date & time 07/30/14  2050 History   First MD Initiated Contact with Patient 07/30/14 2211     Chief Complaint  Patient presents with  . Fall     (Consider location/radiation/quality/duration/timing/severity/associated sxs/prior Treatment) HPI Comments: The patient is an 81 year old female, hx of coumadin 2/2 afib - tripped on the cord of the curling iron - fell forward striking her head on the door jam and falling to the ground, she was able to get up and walk but had pain in the L upper arm and the L face as well as the R knee.  This was acute in onset, just prior to arrival and has improved significantly since arrival.  She has mild headache, no n/v and no change in vision.    Patient is a 81 y.o. female presenting with fall. The history is provided by the patient.  Fall    Past Medical History  Diagnosis Date  . Candidiasis of the esophagus 11/26/2007  . Myocardial infarct, old   . COPD, severe   . Sick sinus syndrome with tachycardia   . Pacemaker   . Chronic kidney disease (CKD), stage III (moderate)   . Hypertension   . Acute appendicitis with rupture   . Spinal stenosis, lumbar   . Adrenal adenoma     Incidentaloma  . Spondylolisthesis of lumbar region   . Lumbar herniated disc     History of HNP L4/5 in 2003  . Hx of colonoscopy with polypectomy 04/27/2010    Dr Cristina Gong found three  tubular adenomas each less than 10 mm size  . Retinal hemorrhage of left eye 06/2010  . Cataract 2013    Bilateral   . Abnormal mammogram, unspecified 08/23/2010    Followup imaging reassuring.  Repeat in 6 months.   Marland Kitchen DISC WITH RADICULOPATHY 04/27/2006    Qualifier: Diagnosis of  By: McDiarmid MD, Sherren Mocha    . Transitional cell carcinoma of ureter, history   . Mild cognitive impairment 10/26/2012    (10/25/12) Failed MiniCog screen  . 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 Normal uric  acid serum level at time of attack.    Marland Kitchen COPD (chronic obstructive pulmonary disease)   . EDEMA-LEGS,DUE TO VENOUS OBSTRUCT. 04/27/2006    Qualifier: Diagnosis of  By: McDiarmid MD, Sherren Mocha    . ANXIETY 04/27/2006    Qualifier: Diagnosis of  By: McDiarmid MD, Sherren Mocha    . HERNIA, HIATAL, NONCONGENITAL 04/27/2006    Qualifier: Diagnosis of  By: McDiarmid MD, Sherren Mocha    . History of Hemorrhoids 04/27/2006    Qualifier: Diagnosis of  By: McDiarmid MD, Sherren Mocha    . RHINITIS, ALLERGIC 04/27/2006    Qualifier: Diagnosis of  By: McDiarmid MD, Sherren Mocha    . SCHATZKI'S RING, HX OF 11/26/2007    Qualifier: Diagnosis of  By: McDiarmid MD, Sherren Mocha  An EGD was performed by Dr Cristina Gong on 04/27/2010 for iron deficiency anemia. There was a a transient hiatal hernia with Schatzki's ring. Stomach and duodenum were normal. EGD on 03/06/12 by Dr Cristina Gong for IDA non-obstructing Schatzki's ring at Gastroesophageal junction, otherwise normal esophagus and stomach.    . Urge incontinence 12/13/2011    Diagnosed in 10/2011 by Dr Bjorn Loser (Urology)   . VITAMIN B12 DEFICIENCY 10/07/2009    Qualifier: Diagnosis of  By: McDiarmid MD, Sherren Mocha  Dx based on a post-TKR  anemia work-up Low normal serum B12 with high Methylmalonic acid and homocysteine level  Vit B12 serum level (10/28/10) > 1500 pg/mL   . Vitamin D deficiency 11/02/2010    Serum vitamin D 25(OH) = 10.9 ng/mL (30 -100) on 10/28/10 c/w Vitamin D deficiency.     . AF (paroxysmal atrial fibrillation) 11/11/2010    Hospitalization (9/8-9/10, Dr Daneen Schick, III, Cardiology) for Paroxysmal Atrial Fibrillation with RVR and anginal pain secondary to demand/supply mismatch in setting of RVR with known circumflex artery branch disease.    Marland Kitchen HYPERTENSION, BENIGN ESSENTIAL 06/18/2007    Qualifier: Diagnosis of  By: McDiarmid MD, Sherren Mocha    . Iron deficiency anemia 08/05/2010    Dr Cristina Gong (GI) has evaluated with EGD, colonoscopy, and video capsular endoscopy in 2011 & 2012.  All have been unrevealing as  to an origin of IDA.  OV with Dr Cristina Gong (10/28/10) assessment of blood in stool per hemoccult and GER. Hbg 12.1 g/dL, MCV 91.8, Ferritin 30 ng/mL. Patient taking on ferrous sulfate tab daily.   EGD on 03/06/12 by Dr Cristina Gong for IDA non-obstructing Schatzki's ring at Gastroesophageal junction, otherwise normal esophagus and stomach.     . Macular degeneration, bilateral 10/04/2010    Right eye is wet MD, the other is dry macular degeneration (ARMD). Pt undergoing some form of vascular endothelial growth factor inhibition intraocular therapy.    . VENTRICULAR HYPERTROPHY, LEFT 08/28/2008    Qualifier: Diagnosis of  By: McDiarmid MD, Sherren Mocha    . CHRONIC KIDNEY DISEASE STAGE III (MODERATE) 09/02/2009    Patient with Solitary Kidney S/P total Nephrectomy for transitional cell caner.    Marland Kitchen COPD 04/27/2006    Qualifier: Diagnosis of  By: McDiarmid MD, Sherren Mocha    . MYOCARDIAL INFARCTION, HX OF 09/25/2008    Qualifier: Diagnosis of  By: McDiarmid MD, Todd  Hospitalized for NSTEMI ( 10/02/2008). PCI (10/02/2008, Dr Daneen Schick) with RCA [place] placed: 99% lesion to 0% lesion. Circumflex Obtuse marginal with 90% stenosis (left for medical mangement for now)   Hospitalization (9/8-9/10, Dr Daneen Schick, III, Cardiology) for Paroxysmal Atrial Fibrillation with RVR and anginal pain secondary to demand/supply mismatch in setting of RVR with known circumflex artery branch disease.    Marland Kitchen SICK SINUS SYNDROME 04/27/2006    Qualifier: Diagnosis of  By: McDiarmid MD, Sherren Mocha  S/P dual chamber pacemaker placement by Dr Osie Cheeks ([facility]) with pacemaker lead extraction & reinsertion - 07/25/2003  Hospitalization (9/8-9/10, Dr Daneen Schick, III, Cardiology) for Paroxysmal Atrial Fibrillation with RVR and anginal pain secondary to demand/supply mismatch in setting of RVR with known circumflex artery branch disease.    . Solitary kidney, acquired 05/17/2010    Surgical removal for transitional cell cancer by Tresa Endo, MD ([facility]).  Surveillance cystoscopy by Dr Alinda Money [facility]) on 10/19/12 without evidence of cystoscopic recurrence. Recommend RTC one year for cystoscopy.   . ADENOMATOUS COLONIC POLYP 03/01/2003    Qualifier: Diagnosis of  By: McDiarmid MD, Sherren Mocha Multiple benign polyps of cecum, ascending, transverse and sigmoid colon by 8/09 colonoscopy by Dr Cristina Gong  Colonoscopy by Dr Cristina Gong for iron-deficiency anemia on 04/27/2010 showed three sessile polyps that were in ascending (3 mm x 9 mm), transverse (4 mm), and cecum (3 mm).  All three were tubular adenomas that were negative for high grade dysplasia or malignancy on pathology. Dr Cristina Gong called the polpys benign and not requiring follow-up in view of the patients age.    . Soft tissue injury of foot  05/03/2011  . PREDIABETES 09/11/2007    Qualifier: Diagnosis of  By: McDiarmid MD, Sherren Mocha    . Numbness and tingling in hands 07/21/2011  . MUSCLE CRAMPS 03/11/2010    Qualifier: Diagnosis of  By: McDiarmid MD, Sherren Mocha    . Mammogram abnormal 02/15/2011  . LUMBAR SPINAL STENOSIS 04/27/2006    Qualifier: Diagnosis of  By: McDiarmid MD, Francisca December HNP w/ L4&5 root encroachment - 05/24/2001,   Spinal Stenosis: L-S MRI: L4-5 Spinal stenosis, - 03/03/2006, Spondylolithesis L5-S1(mild), DJD L4-5 on X-Ray 11/04   . Leg cramps 05/29/2012  . 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 Normal uric acid serum level at time of attack.    . Cellulitis of leg, right 10/01/2012  . Solar lentigo 06/15/2012  . Muscle spasm of back 09/24/2013  . 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  No mass lesion seen. Likely cause of patients 2 to 3 episodes of bleeding from patient's right eye.     Past Surgical History   Procedure Laterality Date  . Pacemaker insertion      Dr Lovena Le ([facility])  . Nephrectomy  For transition cell cancer     Dr Tresa Endo, surgeon  . Replacement total knee  2009, Right knee    Dr Wynelle Link  . Replacement total knee  05/2009, Left knee    Dr Wynelle Link  . Knee arthroscopy w/ synovectomy  11/2009, left knee    Dr Wynelle Link  . Cholecystectomy    . Esophagogastroduodenoscopy  04/27/2010    Dr Cristina Gong - found transient H/H & Schatzki's ring  . Esophagogastroduodenoscopy endoscopy  03/06/2012    Dr Cristina Gong - found non-obstuctive Schatzki's ring at [employer] [facility]. o/w normal EGD.   Marland Kitchen Pacemaker generator change N/A 11/12/2012    Procedure: PACEMAKER GENERATOR CHANGE;  Surgeon: Evans Lance, MD;  Location: [facility];  Service: Cardiovascular;  Laterality: N/A;  . Tonsillectomy    . Breast surgery      breast reduction  . Eye surgery      bilateral cataracts  . Trigger finger release Right 05/22/2014    Procedure: RIGHT HAND A-1 PULLEY RELEASE ;  Surgeon: Roseanne Kaufman, MD;  Location: [facility];  Service: Orthopedics;  Laterality: Right;  . Dupuytren contracture release Right 05/22/2014    Procedure: DUPUYTREN RELEASE AND REPAIR AS NECESSARY RIGHT RING FINGER AND MIDDLE FINGER;  Surgeon: Roseanne Kaufman, MD;  Location: [facility];  Service: Orthopedics;  Laterality: Right;   Family History  Problem Relation Age of Onset  . Dementia Mother   . Heart disease Father    History  Substance Use Topics  . Smoking status: Former Smoker    Types: Cigarettes  . Smokeless tobacco: Never Used  . Alcohol Use: 12.0 oz/week    14 Glasses of wine, 6 Standard drinks or equivalent per week     Comment: 4-5 glasses of wine per week   OB History    No data available     Review of Systems  All other systems reviewed and are negative.     Allergies  Baclofen; Codeine phosphate; Hydrochlorothiazide; and Montelukast sodium  Home Medications   Prior to Admission medications   Medication Sig  Start Date End  Date Taking? Authorizing Provider  ADVAIR DISKUS 250-50 MCG/DOSE AEPB Inhale 2 puffs into the lungs 2 (two) times daily.  03/14/13  Yes Historical Provider, MD  albuterol (VENTOLIN HFA) 108 (90 BASE) MCG/ACT inhaler Inhale 2 puffs into the lungs every 6 (six) hours as needed. If needed for shortness of breath. 02/14/14  Yes Blane Ohara McDiarmid, MD  amiodarone (PACERONE) 200 MG tablet Take 1 Tab(200mg ) Twice daily for 2 weeks. Then Take 1Tab(200mg ) daily. Patient taking differently: Take 200 mg by mouth daily. Take 1 Tab(200mg ) Twice daily for 2 weeks. Then Take 1Tab(200mg ) daily. 06/09/14  Yes Belva Crome, MD  B Complex Vitamins (VITAMIN B COMPLEX PO) Take 1 tablet by mouth daily.   Yes Historical Provider, MD  cholecalciferol (VITAMIN D) 1000 UNITS tablet Take 1,000 Units by mouth daily.   Yes Historical Provider, MD  furosemide (LASIX) 40 MG tablet Take 40 mg by mouth once a week. 1 tablet once a week   Yes Historical Provider, MD  hydrocortisone valerate cream (WESTCORT) 0.2 % Apply 1 application topically daily. 06/05/14  Yes Todd D McDiarmid, MD  LORazepam (ATIVAN) 0.5 MG tablet TAKE 1 TABLET BY MOUTH EVERY 8 HOURS AS NEEDED FOR ANXIETY 07/03/14  Yes Blane Ohara McDiarmid, MD  metoprolol succinate (TOPROL-XL) 50 MG 24 hr tablet Take 1 tablet (50 mg total) by mouth daily. with food 06/01/14  Yes Almyra Deforest, PA  NITROSTAT 0.4 MG SL tablet PLACE ONE TABLET UNDER THE TONGUE EVERY FIVE MINUTES AS NEEDED. Patient taking differently: PLACE ONE TABLET UNDER THE TONGUE EVERY FIVE MINUTES AS NEEDED FOR CHEST PAIN.   Yes Lind Covert, MD  omeprazole (PRILOSEC) 40 MG capsule TAKE 1 CAPSULE EVERY DAY. 11/25/13  Yes Lind Covert, MD  simvastatin (ZOCOR) 40 MG tablet TAKE 1 TABLET BY MOUTH EVERY MORNING 11/18/13  Yes Blane Ohara McDiarmid, MD  tiotropium (SPIRIVA) 18 MCG inhalation capsule Place 1 capsule (18 mcg total) into inhaler and inhale daily. 09/25/13  Yes Blane Ohara McDiarmid, MD  traMADol (ULTRAM)  50 MG tablet Take 1 tablet (50 mg total) by mouth every 6 (six) hours as needed for moderate pain. 02/10/14  Yes Blane Ohara McDiarmid, MD  vitamin B-12 (CYANOCOBALAMIN) 1000 MCG tablet Take 1,000 mcg by mouth daily.    Yes Historical Provider, MD  warfarin (COUMADIN) 1 MG tablet Take 2 tablets (2 mg total) by mouth daily. Patient taking differently: Take 1-2 mg by mouth daily. Alternate taking 1 tablet every other day with 2 tablets every other day. 06/09/14  Yes Belva Crome, MD  azithromycin (ZITHROMAX) 250 MG tablet Take 250 mg by mouth. Take as needed for infection 06/02/14   Historical Provider, MD  metoprolol tartrate (LOPRESSOR) 25 MG tablet Take one tablet if heart racing or chest pain then call EMS Patient taking differently: Take 25 mg by mouth once as needed. Take one tablet if heart racing or chest pain then call EMS 06/05/14   Blane Ohara McDiarmid, MD  oxyCODONE-acetaminophen (PERCOCET) 5-325 MG per tablet Take 1 tablet by mouth every 4 (four) hours as needed. 07/30/14   Noemi Chapel, MD   BP 104/65 mmHg  Pulse 58  Temp(Src) 98.6 F (37 C)  Resp 18  SpO2 94% Physical Exam  Constitutional: She appears well-developed and well-nourished. No distress.  HENT:  Head: Normocephalic.  Mouth/Throat: Oropharynx is clear and moist. No oropharyngeal exudate.  Periorbital bruising on the L, no ttp over the orbital rim or facial bones, no hemotympanum, no battle's  sign.  Eyes: Conjunctivae and EOM are normal. Pupils are equal, round, and reactive to light. Right eye exhibits no discharge. Left eye exhibits no discharge. No scleral icterus.  Periorbital bruising - normal pupillary exam  Neck: Normal range of motion. Neck supple. No JVD present. No thyromegaly present.  No ttp over the neck post or anteriorly  Cardiovascular: Normal rate, regular rhythm, normal heart sounds and intact distal pulses.  Exam reveals no gallop and no friction rub.   No murmur heard. Pulmonary/Chest: Effort normal and breath  sounds normal. No respiratory distress. She has no wheezes. She has no rales.  Abdominal: Soft. Bowel sounds are normal. She exhibits no distension and no mass. There is no tenderness.  Musculoskeletal: Normal range of motion. She exhibits tenderness ( ttp over the L shoulder - dec ROM 2/2 pain - no deformity.  all other joints supple, compartments soft.). She exhibits no edema.  Lymphadenopathy:    She has no cervical adenopathy.  Neurological: She is alert. Coordination normal.  Skin: Skin is warm and dry.  Skin tear over the R knee - mild, norma ROM of knee without pain - can straight leg raise.  L upper arm skin tear with mild oozing blood - no repairable laceration.  Psychiatric: She has a normal mood and affect. Her behavior is normal.  Nursing note and vitals reviewed.   ED Course  Procedures (including critical care time) Labs Review Labs Reviewed  PROTIME-INR - Abnormal; Notable for the following:    Prothrombin Time 21.7 (*)    INR 1.90 (*)    All other components within normal limits  I-STAT CHEM 8, ED - Abnormal; Notable for the following:    BUN 25 (*)    Creatinine, Ser 1.20 (*)    Glucose, Bld 115 (*)    Calcium, Ion 1.12 (*)    Hemoglobin 11.2 (*)    HCT 33.0 (*)    All other components within normal limits    Imaging Review Ct Head Wo Contrast  07/30/2014   CLINICAL DATA:  Tripped over cord and fell, hitting corner of left eye. Left periorbital bruising and swelling. Initial encounter.  EXAM: CT HEAD WITHOUT CONTRAST  TECHNIQUE: Contiguous axial images were obtained from the base of the skull through the vertex without intravenous contrast.  COMPARISON:  CT of the head performed 09/21/2004  FINDINGS: There is no evidence of acute infarction, mass lesion, or intra- or extra-axial hemorrhage on CT.  Prominence of the ventricles and sulci reflects mild to moderate cortical volume loss. Cerebellar atrophy is noted. Scattered periventricular and subcortical white matter  change likely reflects small vessel ischemic microangiopathy.  The brainstem and fourth ventricle are within normal limits. The basal ganglia are unremarkable in appearance. The cerebral hemispheres demonstrate grossly normal gray-white differentiation. No mass effect or midline shift is seen.  There is no evidence of fracture; visualized osseous structures are unremarkable in appearance. The orbits are within normal limits. A tiny mucus retention cyst or polyp is noted at the right maxillary sinus. The remaining paranasal sinuses and mastoid air cells are well-aerated. Soft tissue swelling is noted lateral to the left orbit, with a small associated soft tissue hematoma.  IMPRESSION: 1. No evidence of traumatic intracranial injury or fracture. 2. Soft tissue swelling lateral to the left orbit, with a small associated soft tissue hematoma. 3. Mild to moderate cortical volume loss and scattered small vessel ischemic microangiopathy. 4. Tiny mucus retention cyst or polyp at the right maxillary sinus.  Electronically Signed   By: Garald Balding M.D.   On: 07/30/2014 21:47   Dg Shoulder Left  07/30/2014   CLINICAL DATA:  Status post fall in bathroom, landing on left shoulder. Left shoulder pain. Initial encounter.  EXAM: LEFT SHOULDER - 2+ VIEW  COMPARISON:  Left shoulder radiographs performed 01/10/2014  FINDINGS: There is no evidence of fracture or dislocation. The left humeral head is seated within the glenoid fossa. The acromioclavicular joint is unremarkable in appearance. No significant soft tissue abnormalities are seen. The visualized portions of the left lung are clear.  IMPRESSION: No evidence of fracture or dislocation.   Electronically Signed   By: Garald Balding M.D.   On: 07/30/2014 23:02      MDM   Final diagnoses:  Periorbital hematoma of left eye  Closed head injury, initial encounter  Contusion of left shoulder, initial encounter  Skin tear of upper arm without complication, left, initial  encounter    Fall, with bruising but no ICH, no fracture.  Labs without significant anemia, INR pending, xrya of shoulder pending, wound care given with non adherent dressing under occlusive dressing.  Pt looks stable for d/c  All findings given to pt - she expresses her understanding at the indications for return.  Imagine of shoulder neg, INR OK, wound care given  Meds given in ED:  Medications  bacitracin ointment 1 application (not administered)  oxyCODONE-acetaminophen (PERCOCET/ROXICET) 5-325 MG per tablet 2 tablet (2 tablets Oral Given 07/30/14 2225)    New Prescriptions   OXYCODONE-ACETAMINOPHEN (PERCOCET) 5-325 MG PER TABLET    Take 1 tablet by mouth every 4 (four) hours as needed.      Noemi Chapel, MD 07/30/14 [phone]

## 2014-07-31 ENCOUNTER — Telehealth: Payer: Self-pay | Admitting: Family Medicine

## 2014-07-31 DIAGNOSIS — E7800 Pure hypercholesterolemia, unspecified: Secondary | ICD-10-CM | POA: Insufficient documentation

## 2014-07-31 MED ORDER — ATORVASTATIN CALCIUM 40 MG PO TABS: 40.0000 mg | ORAL_TABLET | Freq: Every day | ORAL | Status: DC

## 2014-07-31 NOTE — Progress Notes (Signed)
Patient ID: [name], female   DOB: 1923-04-29, 81 y.o.   MRN: [id number] Please let Mrs Stoves know that Dr Arlo Butt is changing her cholesterol lowering medication, simvastatin, to atorvastatin because her insurance will not cover the cost of the simvastatin. New Rx for atorvastatin sent to [facility] pharmacy on [address]

## 2014-07-31 NOTE — Addendum Note (Signed)
Addended byWendy Poet, TODD D on: 07/31/2014 08:05 AM   Modules accepted: Orders, Medications

## 2014-08-01 NOTE — Telephone Encounter (Signed)
x

## 2014-08-01 NOTE — Progress Notes (Signed)
Advised pt as directed below and verbalized understanding. Maneh Sieben, CMA. 

## 2014-08-05 ENCOUNTER — Telehealth: Payer: Self-pay | Admitting: Family Medicine

## 2014-08-05 DIAGNOSIS — E7800 Pure hypercholesterolemia, unspecified: Secondary | ICD-10-CM

## 2014-08-05 DIAGNOSIS — I252 Old myocardial infarction: Secondary | ICD-10-CM

## 2014-08-05 DIAGNOSIS — Z9189 Other specified personal risk factors, not elsewhere classified: Secondary | ICD-10-CM

## 2014-08-05 DIAGNOSIS — I2510 Atherosclerotic heart disease of native coronary artery without angina pectoris: Secondary | ICD-10-CM

## 2014-08-05 MED ORDER — ATORVASTATIN CALCIUM 40 MG PO TABS: 40.0000 mg | ORAL_TABLET | Freq: Every day | ORAL | Status: DC

## 2014-08-05 NOTE — Telephone Encounter (Signed)
Please see message from patient. Atorvastatin (LIPITOR) 40 MG tablet resent to patient's pharmacy.  Derl Barrow, RN

## 2014-08-05 NOTE — Telephone Encounter (Signed)
[name] was originally prescribed simvastatin (ZOCOR) 40 MG tablet however, she was informed by her PCP that her insurance would not cover this. She said that her PCP told her that he would send a different medicine that would be covered, however the pharmacy has not yet received this order. She is calling to inquire about this and to see when it would be possible for her to pick this up. She doesn't know what the name of the medication was that should have been sent. Thank you, [name], ASA

## 2014-08-07 ENCOUNTER — Ambulatory Visit (INDEPENDENT_AMBULATORY_CARE_PROVIDER_SITE_OTHER): Payer: Medicare Other | Admitting: Family Medicine

## 2014-08-07 ENCOUNTER — Encounter: Payer: Self-pay | Admitting: Family Medicine

## 2014-08-07 ENCOUNTER — Ambulatory Visit (INDEPENDENT_AMBULATORY_CARE_PROVIDER_SITE_OTHER): Payer: Medicare Other | Admitting: *Deleted

## 2014-08-07 VITALS — BP 117/56 | HR 66 | Temp 98.0°F | Ht 64.5 in | Wt 161.5 lb

## 2014-08-07 DIAGNOSIS — I2510 Atherosclerotic heart disease of native coronary artery without angina pectoris: Secondary | ICD-10-CM

## 2014-08-07 DIAGNOSIS — I480 Paroxysmal atrial fibrillation: Secondary | ICD-10-CM | POA: Diagnosis not present

## 2014-08-07 DIAGNOSIS — T148 Other injury of unspecified body region: Secondary | ICD-10-CM

## 2014-08-07 DIAGNOSIS — T148XXA Other injury of unspecified body region, initial encounter: Secondary | ICD-10-CM | POA: Insufficient documentation

## 2014-08-07 DIAGNOSIS — Z5181 Encounter for therapeutic drug level monitoring: Secondary | ICD-10-CM | POA: Diagnosis not present

## 2014-08-07 LAB — POCT INR: INR: 3.1

## 2014-08-07 NOTE — Progress Notes (Signed)
   Subjective:    Patient ID: [name], female    DOB: 10/22/1923, 81 y.o.   MRN: [id number]  Seen for Same day visit for   CC: ED follow-up for fall  Reports doing well since fall. Last fall was due to tripping over cord. She reports feeling a little off balance since her falls but denies dizziness, lightheadedness or additional falls. Started Ativan ~ 2-3 weeks prior to fall but denies any balance problems, dizziness, orthostatic symptoms after starting that medication. Continue to have mild pain in her left arm and right knee since fall, but not requiring pain medications.  Review of Systems   See HPI for ROS. Objective:  BP 117/56 mmHg  Pulse 66  Temp(Src) 98 F (36.7 C) (Oral)  Ht 5' 4.5" (1.638 m)  Wt 161 lb 8 oz (73.256 kg)  BMI 27.30 kg/m2  General: NAD Left arm: ~ 4 cm abrasion with minimal bleeding, no erythema, warmth or pus Right anterior knee:  ~ 2 cm abrasion with minimal bleeding, no erythema, warmth or pus    Assessment & Plan:  See Problem List Documentation

## 2014-08-07 NOTE — Assessment & Plan Note (Signed)
Multiple superficial abrasions on left arm and right knee s/p mechanical fall. Doubt related to recent Ativan Rx.  - No current signs of infection - wounds cleaned and dressing reapplied - Advised nurse visit in ~ 4-5 days for dressing change  - Advised PCP visit to re-assess fall risk and continue ativan use

## 2014-08-12 ENCOUNTER — Ambulatory Visit: Payer: Medicare Other

## 2014-08-13 DIAGNOSIS — Z4789 Encounter for other orthopedic aftercare: Secondary | ICD-10-CM | POA: Diagnosis not present

## 2014-08-15 ENCOUNTER — Ambulatory Visit (INDEPENDENT_AMBULATORY_CARE_PROVIDER_SITE_OTHER): Payer: Medicare Other | Admitting: Interventional Cardiology

## 2014-08-15 ENCOUNTER — Encounter: Payer: Self-pay | Admitting: Interventional Cardiology

## 2014-08-15 VITALS — BP 102/62 | HR 64 | Ht 64.5 in | Wt 158.1 lb

## 2014-08-15 DIAGNOSIS — I2510 Atherosclerotic heart disease of native coronary artery without angina pectoris: Secondary | ICD-10-CM

## 2014-08-15 DIAGNOSIS — I495 Sick sinus syndrome: Secondary | ICD-10-CM

## 2014-08-15 DIAGNOSIS — I10 Essential (primary) hypertension: Secondary | ICD-10-CM

## 2014-08-15 DIAGNOSIS — Z79899 Other long term (current) drug therapy: Secondary | ICD-10-CM

## 2014-08-15 DIAGNOSIS — Z7901 Long term (current) use of anticoagulants: Secondary | ICD-10-CM

## 2014-08-15 DIAGNOSIS — Z950 Presence of cardiac pacemaker: Secondary | ICD-10-CM

## 2014-08-15 DIAGNOSIS — I480 Paroxysmal atrial fibrillation: Secondary | ICD-10-CM

## 2014-08-15 NOTE — Patient Instructions (Addendum)
Medication Instructions:  Your physician recommends that you continue on your current medications as directed. Please refer to the Current Medication list given to you today.   Labwork: Your physician recommends that you return for lab work in October 17,2016   Testing/Procedures: A chest x-ray takes a picture of the organs and structures inside the chest, including the heart, lungs, and blood vessels. This test can show several things, including, whether the heart is enlarges; whether fluid is building up in the lungs; and whether pacemaker / defibrillator leads are still in place. (To be done in Oct 2016 @ [place], you do not need an appointment)   Follow-Up: Your physician wants you to follow-up in: 6 months with Dr.Smith You will receive a reminder letter in the mail two months in advance. If you don't receive a letter, please call our office to schedule the follow-up appointment.   Any Other Special Instructions Will Be Listed Below (If Applicable).

## 2014-08-15 NOTE — Progress Notes (Signed)
Cardiology Office Note   Date:  08/15/2014   ID:  [name], DOB 04-08-1923, MRN [id number]  PCP:  Acquanetta Sit, MD  Cardiologist:  Sinclair Grooms, MD   Chief Complaint  Patient presents with  . Atrial Fibrillation      History of Present Illness: [name] is a 81 y.o. female who presents for  Paroxysmal atrial fibrillation , amiodarone therapy for rhythm control, hypertension, coronary artery disease with previous stenting , chronic diastolic heart failure, an chronic anticoagulation therapy. Patient also has tachybradycardia syndrome with permanent DDD pacemaker.   Overall she feels well. Since starting amiodarone of been no recurrent trips to the emergency room with atrial fibrillation. She denies syncope. No angina. A recent myocardial perfusion study was negative for ischemia/low risk.    Past Medical History  Diagnosis Date  . Candidiasis of the esophagus 11/26/2007  . Myocardial infarct, old   . COPD, severe   . Sick sinus syndrome with tachycardia   . Pacemaker   . Chronic kidney disease (CKD), stage III (moderate)   . Hypertension   . Acute appendicitis with rupture   . Spinal stenosis, lumbar   . Adrenal adenoma     Incidentaloma  . Spondylolisthesis of lumbar region   . Lumbar herniated disc     History of HNP L4/5 in 2003  . Hx of colonoscopy with polypectomy 04/27/2010    Dr Cristina Gong found three  tubular adenomas each less than 10 mm size  . Retinal hemorrhage of left eye 06/2010  . Cataract 2013    Bilateral   . Abnormal mammogram, unspecified 08/23/2010    Followup imaging reassuring.  Repeat in 6 months.   Marland Kitchen DISC WITH RADICULOPATHY 04/27/2006    Qualifier: Diagnosis of  By: McDiarmid MD, Sherren Mocha    . Transitional cell carcinoma of ureter, history   . Mild cognitive impairment 10/26/2012    (10/25/12) Failed MiniCog screen  . 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  Normal uric acid serum level at time of attack.    Marland Kitchen COPD (chronic obstructive pulmonary disease)   . EDEMA-LEGS,DUE TO VENOUS OBSTRUCT. 04/27/2006    Qualifier: Diagnosis of  By: McDiarmid MD, Sherren Mocha    . ANXIETY 04/27/2006    Qualifier: Diagnosis of  By: McDiarmid MD, Sherren Mocha    . HERNIA, HIATAL, NONCONGENITAL 04/27/2006    Qualifier: Diagnosis of  By: McDiarmid MD, Sherren Mocha    . History of Hemorrhoids 04/27/2006    Qualifier: Diagnosis of  By: McDiarmid MD, Sherren Mocha    . RHINITIS, ALLERGIC 04/27/2006    Qualifier: Diagnosis of  By: McDiarmid MD, Sherren Mocha    . SCHATZKI'S RING, HX OF 11/26/2007    Qualifier: Diagnosis of  By: McDiarmid MD, Sherren Mocha  An EGD was performed by Dr Cristina Gong on 04/27/2010 for iron deficiency anemia. There was a a transient hiatal hernia with Schatzki's ring. Stomach and duodenum were normal. EGD on 03/06/12 by Dr Cristina Gong for IDA non-obstructing Schatzki's ring at Gastroesophageal junction, otherwise normal esophagus and stomach.    . Urge incontinence 12/13/2011    Diagnosed in 10/2011 by Dr Bjorn Loser (Urology)   . VITAMIN B12 DEFICIENCY 10/07/2009    Qualifier: Diagnosis of  By: McDiarmid MD, Sherren Mocha  Dx based on a post-TKR anemia work-up Low normal serum B12 with high Methylmalonic acid and homocysteine level  Vit B12 serum level (10/28/10) >  1500 pg/mL   . Vitamin D deficiency 11/02/2010    Serum vitamin D 25(OH) = 10.9 ng/mL (30 -100) on 10/28/10 c/w Vitamin D deficiency.     . AF (paroxysmal atrial fibrillation) 11/11/2010    Hospitalization (9/8-9/10, Dr Daneen Schick, III, Cardiology) for Paroxysmal Atrial Fibrillation with RVR and anginal pain secondary to demand/supply mismatch in setting of RVR with known circumflex artery branch disease.    Marland Kitchen HYPERTENSION, BENIGN ESSENTIAL 06/18/2007    Qualifier: Diagnosis of  By: McDiarmid MD, Sherren Mocha    . Iron deficiency anemia 08/05/2010    Dr Cristina Gong (GI) has evaluated with EGD, colonoscopy, and video capsular endoscopy in 2011 & 2012.  All have been  unrevealing as to an origin of IDA.  OV with Dr Cristina Gong (10/28/10) assessment of blood in stool per hemoccult and GER. Hbg 12.1 g/dL, MCV 91.8, Ferritin 30 ng/mL. Patient taking on ferrous sulfate tab daily.   EGD on 03/06/12 by Dr Cristina Gong for IDA non-obstructing Schatzki's ring at Gastroesophageal junction, otherwise normal esophagus and stomach.     . Macular degeneration, bilateral 10/04/2010    Right eye is wet MD, the other is dry macular degeneration (ARMD). Pt undergoing some form of vascular endothelial growth factor inhibition intraocular therapy.    . VENTRICULAR HYPERTROPHY, LEFT 08/28/2008    Qualifier: Diagnosis of  By: McDiarmid MD, Sherren Mocha    . CHRONIC KIDNEY DISEASE STAGE III (MODERATE) 09/02/2009    Patient with Solitary Kidney S/P total Nephrectomy for transitional cell caner.    Marland Kitchen COPD 04/27/2006    Qualifier: Diagnosis of  By: McDiarmid MD, Sherren Mocha    . MYOCARDIAL INFARCTION, HX OF 09/25/2008    Qualifier: Diagnosis of  By: McDiarmid MD, Todd  Hospitalized for NSTEMI ( 10/02/2008). PCI (10/02/2008, Dr Daneen Schick) with RCA [place] placed: 99% lesion to 0% lesion. Circumflex Obtuse marginal with 90% stenosis (left for medical mangement for now)   Hospitalization (9/8-9/10, Dr Daneen Schick, III, Cardiology) for Paroxysmal Atrial Fibrillation with RVR and anginal pain secondary to demand/supply mismatch in setting of RVR with known circumflex artery branch disease.    Marland Kitchen SICK SINUS SYNDROME 04/27/2006    Qualifier: Diagnosis of  By: McDiarmid MD, Sherren Mocha  S/P dual chamber pacemaker placement by Dr Osie Cheeks ([facility]) with pacemaker lead extraction & reinsertion - 07/25/2003  Hospitalization (9/8-9/10, Dr Daneen Schick, III, Cardiology) for Paroxysmal Atrial Fibrillation with RVR and anginal pain secondary to demand/supply mismatch in setting of RVR with known circumflex artery branch disease.    . Solitary kidney, acquired 05/17/2010    Surgical removal for transitional cell cancer by Tresa Endo, MD  (Urol). Surveillance cystoscopy by Dr Alinda Money [facility]) on 10/19/12 without evidence of cystoscopic recurrence. Recommend RTC one year for cystoscopy.   . ADENOMATOUS COLONIC POLYP 03/01/2003    Qualifier: Diagnosis of  By: McDiarmid MD, Sherren Mocha Multiple benign polyps of cecum, ascending, transverse and sigmoid colon by 8/09 colonoscopy by Dr Cristina Gong  Colonoscopy by Dr Cristina Gong for iron-deficiency anemia on 04/27/2010 showed three sessile polyps that were in ascending (3 mm x 9 mm), transverse (4 mm), and cecum (3 mm).  All three were tubular adenomas that were negative for high grade dysplasia or malignancy on pathology. Dr Cristina Gong called the polpys benign and not requiring follow-up in view of the patients age.    . Soft tissue injury of foot 05/03/2011  . PREDIABETES 09/11/2007    Qualifier: Diagnosis of  By: McDiarmid MD, Sherren Mocha    .  Numbness and tingling in hands 07/21/2011  . MUSCLE CRAMPS 03/11/2010    Qualifier: Diagnosis of  By: McDiarmid MD, Sherren Mocha    . Mammogram abnormal 02/15/2011  . LUMBAR SPINAL STENOSIS 04/27/2006    Qualifier: Diagnosis of  By: McDiarmid MD, Francisca December HNP w/ L4&5 root encroachment - 05/24/2001,   Spinal Stenosis: L-S MRI: L4-5 Spinal stenosis, - 03/03/2006, Spondylolithesis L5-S1(mild), DJD L4-5 on X-Ray 11/04   . Leg cramps 05/29/2012  . 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 Normal uric acid serum level at time of attack.    . Cellulitis of leg, right 10/01/2012  . Solar lentigo 06/15/2012  . Muscle spasm of back 09/24/2013  . 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  No mass lesion seen. Likely cause of patients 2 to 3 episodes of bleeding from patient's right eye.      Past Surgical  History  Procedure Laterality Date  . Pacemaker insertion      Dr Lovena Le ([facility])  . Nephrectomy  For transition cell cancer     Dr Tresa Endo, surgeon  . Replacement total knee  2009, Right knee    Dr Wynelle Link  . Replacement total knee  05/2009, Left knee    Dr Wynelle Link  . Knee arthroscopy w/ synovectomy  11/2009, left knee    Dr Wynelle Link  . Cholecystectomy    . Esophagogastroduodenoscopy  04/27/2010    Dr Cristina Gong - found transient H/H & Schatzki's ring  . Esophagogastroduodenoscopy endoscopy  03/06/2012    Dr Cristina Gong - found non-obstuctive Schatzki's ring at [employer] o/w normal EGD.   Marland Kitchen Pacemaker generator change N/A 11/12/2012    Procedure: PACEMAKER GENERATOR CHANGE;  Surgeon: Evans Lance, MD;  Location: [facility] CATH LAB;  Service: Cardiovascular;  Laterality: N/A;  . Tonsillectomy    . Breast surgery      breast reduction  . Eye surgery      bilateral cataracts  . Trigger finger release Right 05/22/2014    Procedure: RIGHT HAND A-1 PULLEY RELEASE ;  Surgeon: Roseanne Kaufman, MD;  Location: [facility];  Service: Orthopedics;  Laterality: Right;  . Dupuytren contracture release Right 05/22/2014    Procedure: DUPUYTREN RELEASE AND REPAIR AS NECESSARY RIGHT RING FINGER AND MIDDLE FINGER;  Surgeon: Roseanne Kaufman, MD;  Location: [facility];  Service: Orthopedics;  Laterality: Right;     Current Outpatient Prescriptions  Medication Sig Dispense Refill  . ADVAIR DISKUS 250-50 MCG/DOSE AEPB Inhale 2 puffs into the lungs 2 (two) times daily.     Marland Kitchen albuterol (VENTOLIN HFA) 108 (90 BASE) MCG/ACT inhaler Inhale 2 puffs into the lungs every 6 (six) hours as needed. If needed for shortness of breath. 1 Inhaler 3  . amiodarone (PACERONE) 200 MG tablet Take 1 tablet by mouth daily.  3  . atorvastatin (LIPITOR) 40 MG tablet Take 1 tablet (40 mg total) by mouth daily. 90 tablet 3  . baclofen (LIORESAL) 10 MG tablet Take 1 tablet by mouth 3 (three) times daily.  0  . cholecalciferol (VITAMIN D) 1000 UNITS  tablet Take 1,000 Units by mouth daily.    . furosemide (LASIX) 40 MG tablet Take 40 mg by mouth once a week. 1 tablet once  a week    . hydrocortisone valerate cream (WESTCORT) 0.2 % Apply 1 application topically daily. 45 g 1  . LORazepam (ATIVAN) 0.5 MG tablet TAKE 1 TABLET BY MOUTH EVERY 8 HOURS AS NEEDED FOR ANXIETY 30 tablet 1  . metoprolol succinate (TOPROL-XL) 50 MG 24 hr tablet Take 1 tablet (50 mg total) by mouth daily. with food 90 tablet 3  . metoprolol tartrate (LOPRESSOR) 25 MG tablet Take one tablet if heart racing or chest pain then call EMS (Patient taking differently: Take 25 mg by mouth once as needed. Take one tablet if heart racing or chest pain then call EMS) 30 tablet 2  . nitroGLYCERIN (NITROSTAT) 0.4 MG SL tablet Place 0.4 mg under the tongue every 5 (five) minutes as needed for chest pain.    Marland Kitchen omeprazole (PRILOSEC) 40 MG capsule TAKE 1 CAPSULE EVERY DAY. 90 capsule 2  . oxyCODONE-acetaminophen (PERCOCET) 5-325 MG per tablet Take 1 tablet by mouth every 4 (four) hours as needed. 10 tablet 0  . tiotropium (SPIRIVA) 18 MCG inhalation capsule Place 1 capsule (18 mcg total) into inhaler and inhale daily. 30 capsule PRN  . traMADol (ULTRAM) 50 MG tablet Take 1 tablet (50 mg total) by mouth every 6 (six) hours as needed for moderate pain. 30 tablet 1  . vitamin B-12 (CYANOCOBALAMIN) 1000 MCG tablet Take 1,000 mcg by mouth daily.     Marland Kitchen warfarin (COUMADIN) 1 MG tablet Take 2 tablets (2 mg total) by mouth daily. (Patient taking differently: Take 1-2 mg by mouth daily. Alternate taking 1 tablet every other day with 2 tablets every other day.)     No current facility-administered medications for this visit.    Allergies:   Baclofen; Codeine phosphate; Hydrochlorothiazide; and Montelukast sodium    Social History:  The patient  reports that she has quit smoking. Her smoking use included Cigarettes. She has never used smokeless tobacco. She reports that she drinks about 12.0 oz of  alcohol per week. She reports that she does not use illicit drugs.   Family History:  The patient's family history includes Dementia in her mother; Heart disease in her father.    ROS:  Please see the history of present illness.   Otherwise, review of systems are positive for  Constipation, anxiety, back discomfort, decreased hearing, intermittent leg swelling , and easy bruising. No head trauma or falls..   All other systems are reviewed and negative.    PHYSICAL EXAM: VS:  BP 102/62 mmHg  Pulse 64  Ht 5' 4.5" (1.638 m)  Wt 71.723 kg (158 lb 1.9 oz)  BMI 26.73 kg/m2  SpO2 96% , BMI Body mass index is 26.73 kg/(m^2). GEN: Well nourished, well developed, in no acute distress HEENT: normal Neck: no JVD, carotid bruits, or masses Cardiac: RRR; no murmurs, rubs, or gallops,no edema  Respiratory:  clear to auscultation bilaterally, normal work of breathing GI: soft, nontender, nondistended, + BS MS: no deformity or atrophy Skin: warm and dry, no rash Neuro:  Strength and sensation are intact Psych: euthymic mood, full affect   EKG:  EKG is not ordered today.   Recent Labs: 05/08/2014: ALT 9 05/30/2014: B Natriuretic Peptide 669.6* 06/01/2014: Platelets 258 07/30/2014: BUN 25*; Creatinine, Ser 1.20*; Hemoglobin 11.2*; Potassium 4.2; Sodium 139    Lipid Panel    Component Value Date/Time   CHOL 179 05/20/2013 1604   TRIG 242* 05/20/2013 1604   HDL 38* 05/20/2013 1604   CHOLHDL 4.7 05/20/2013 1604   VLDL  48* 05/20/2013 1604   LDLCALC 93 05/20/2013 1604   LDLDIRECT 74 05/08/2014 1110      Wt Readings from Last 3 Encounters:  08/15/14 71.723 kg (158 lb 1.9 oz)  08/07/14 73.256 kg (161 lb 8 oz)  06/23/14 72.122 kg (159 lb)      Other studies Reviewed: Additional studies/ records that were reviewed today include: . Review of the above records demonstrates:    ASSESSMENT AND PLAN  HYPERTENSION, BENIGN ESSENTIAL -  controlled  Chronic anticoagulation -  No bleeding  complications  Coronary artery disease involving native coronary artery of native heart without angina pectoris -  Recent low risk myocardial perfusion study  AF (paroxysmal atrial fibrillation) -  Decreased recurrences on amiodarone  On amiodarone therapy -  No obvious side effects.   Plan: TSH, Hepatic function panel, DG Chest 2 View in 3 months.      Current medicines are reviewed at length with the patient today.  The patient does not have concerns regarding medicines.  The following changes have been made:  no change  Labs/ tests ordered today include:  Blow studies will be done in 3 months which will be 6 months after starting amiodarone.  Orders Placed This Encounter  Procedures  . DG Chest 2 View  . TSH  . Hepatic function panel     Disposition:   FU with HS in 6 months  Signed, Sinclair Grooms, MD  08/15/2014 11:50 AM    [facility] [address], [place], [place]  [address] Phone: [phone]; Fax: [phone]

## 2014-08-19 ENCOUNTER — Telehealth: Payer: Self-pay | Admitting: Family Medicine

## 2014-08-19 NOTE — Telephone Encounter (Signed)
Will forward to MD.  Provider is only in clinic on 09/04/2014.  Taylan Mayhan,CMA

## 2014-08-19 NOTE — Telephone Encounter (Signed)
Pt called to schedule a followup appt. She is going on a cruise July 28 and would like to see him before going. Coumadin is not working right. There are no appts available in July Please advise

## 2014-08-21 ENCOUNTER — Ambulatory Visit (INDEPENDENT_AMBULATORY_CARE_PROVIDER_SITE_OTHER): Payer: Medicare Other | Admitting: *Deleted

## 2014-08-21 DIAGNOSIS — I480 Paroxysmal atrial fibrillation: Secondary | ICD-10-CM | POA: Diagnosis not present

## 2014-08-21 DIAGNOSIS — Z5181 Encounter for therapeutic drug level monitoring: Secondary | ICD-10-CM | POA: Diagnosis not present

## 2014-08-21 LAB — POCT INR: INR: 5.5

## 2014-08-21 NOTE — Telephone Encounter (Signed)
Please try to schedule in a Geriatric Clinic before July 28. If no available slots in [facility], may double book [facility] clinic on July 7

## 2014-08-22 NOTE — Telephone Encounter (Signed)
I guess the July specialty clinic schedules are not out yet. Please double book me with Ms Barakat for my next continuity clinic.  Please book at end of my clinic if possible.

## 2014-08-22 NOTE — Telephone Encounter (Signed)
Do you know when your July Geri clinic will be?  There is no schedule for July yet. Nija Koopman,CMA

## 2014-08-22 NOTE — Telephone Encounter (Signed)
Spoke with patient and she is aware that she will be seen on 09-04-14 at 11:30am. [name]

## 2014-08-26 ENCOUNTER — Other Ambulatory Visit: Payer: Self-pay | Admitting: Family Medicine

## 2014-08-29 ENCOUNTER — Ambulatory Visit (INDEPENDENT_AMBULATORY_CARE_PROVIDER_SITE_OTHER): Payer: Medicare Other | Admitting: Pharmacist

## 2014-08-29 DIAGNOSIS — I480 Paroxysmal atrial fibrillation: Secondary | ICD-10-CM | POA: Diagnosis not present

## 2014-08-29 DIAGNOSIS — Z5181 Encounter for therapeutic drug level monitoring: Secondary | ICD-10-CM | POA: Diagnosis not present

## 2014-08-29 LAB — POCT INR: INR: 4.6

## 2014-09-04 ENCOUNTER — Ambulatory Visit: Payer: Medicare Other | Admitting: Family Medicine

## 2014-09-04 ENCOUNTER — Encounter: Payer: Self-pay | Admitting: Family Medicine

## 2014-09-04 ENCOUNTER — Ambulatory Visit (INDEPENDENT_AMBULATORY_CARE_PROVIDER_SITE_OTHER): Payer: Medicare Other | Admitting: Family Medicine

## 2014-09-04 VITALS — BP 111/64 | HR 100 | Temp 97.8°F | Ht 65.0 in | Wt 156.0 lb

## 2014-09-04 DIAGNOSIS — I480 Paroxysmal atrial fibrillation: Secondary | ICD-10-CM | POA: Diagnosis not present

## 2014-09-04 DIAGNOSIS — D509 Iron deficiency anemia, unspecified: Secondary | ICD-10-CM | POA: Diagnosis not present

## 2014-09-04 DIAGNOSIS — I2510 Atherosclerotic heart disease of native coronary artery without angina pectoris: Secondary | ICD-10-CM | POA: Diagnosis not present

## 2014-09-04 DIAGNOSIS — Z79899 Other long term (current) drug therapy: Secondary | ICD-10-CM

## 2014-09-04 DIAGNOSIS — J449 Chronic obstructive pulmonary disease, unspecified: Secondary | ICD-10-CM

## 2014-09-04 LAB — COMPREHENSIVE METABOLIC PANEL
ALT: 8 U/L (ref 0–35)
AST: 14 U/L (ref 0–37)
Albumin: 3.9 g/dL (ref 3.5–5.2)
Alkaline Phosphatase: 70 U/L (ref 39–117)
BUN: 36 mg/dL — AB (ref 6–23)
CO2: 30 mEq/L (ref 19–32)
CREATININE: 1.47 mg/dL — AB (ref 0.50–1.10)
Calcium: 8.6 mg/dL (ref 8.4–10.5)
Chloride: 103 mEq/L (ref 96–112)
Glucose, Bld: 115 mg/dL — ABNORMAL HIGH (ref 70–99)
Potassium: 4.1 mEq/L (ref 3.5–5.3)
Sodium: 144 mEq/L (ref 135–145)
Total Bilirubin: 0.6 mg/dL (ref 0.2–1.2)
Total Protein: 6.3 g/dL (ref 6.0–8.3)

## 2014-09-04 LAB — FERRITIN: Ferritin: 249 ng/mL (ref 10–291)

## 2014-09-04 MED ORDER — AZITHROMYCIN 250 MG PO TABS: ORAL_TABLET | ORAL | Status: DC

## 2014-09-04 MED ORDER — PREDNISONE 20 MG PO TABS
40.0000 mg | ORAL_TABLET | Freq: Every day | ORAL | Status: DC
Start: 2014-09-04 — End: 2014-10-29

## 2014-09-04 NOTE — Patient Instructions (Signed)
Dr McDiarmid will call you if your tests are not good. Otherwise he will send you a letter.  If you sign up for MyChart online, you will be able to see your test results once Dr McDiarmid has reviewed them.  If you do not hear from us with in 2 weeks please call our office   

## 2014-09-05 ENCOUNTER — Ambulatory Visit (INDEPENDENT_AMBULATORY_CARE_PROVIDER_SITE_OTHER): Payer: Medicare Other | Admitting: *Deleted

## 2014-09-05 DIAGNOSIS — I480 Paroxysmal atrial fibrillation: Secondary | ICD-10-CM

## 2014-09-05 DIAGNOSIS — Z5181 Encounter for therapeutic drug level monitoring: Secondary | ICD-10-CM

## 2014-09-05 LAB — CBC
HCT: 36.1 % (ref 36.0–46.0)
Hemoglobin: 11.9 g/dL — ABNORMAL LOW (ref 12.0–15.0)
MCH: 29.9 pg (ref 26.0–34.0)
MCHC: 33 g/dL (ref 30.0–36.0)
MCV: 90.7 fL (ref 78.0–100.0)
MPV: 9.6 fL (ref 8.6–12.4)
PLATELETS: 285 10*3/uL (ref 150–400)
RBC: 3.98 MIL/uL (ref 3.87–5.11)
RDW: 13.9 % (ref 11.5–15.5)
WBC: 6.5 10*3/uL (ref 4.0–10.5)

## 2014-09-05 LAB — PROTIME-INR
INR: 7.3 ratio — AB (ref 0.8–1.0)
Prothrombin Time: 76.2 s (ref 9.6–13.1)

## 2014-09-05 LAB — POCT INR: INR: 8

## 2014-09-08 NOTE — Assessment & Plan Note (Signed)
Established problem  Currently in regular rhythm with HR 90 BPM at rest. Stable  On Warfarin and Amiodarone with [facility] warfarin clinic monitoring and dosing her warfarin. Dr [name] (Card) prescribing and monitoring patient's Amiodarone therapy.  No changes in therapy recommended.

## 2014-09-08 NOTE — Assessment & Plan Note (Addendum)
Established problem Stable Continue Spiriva and Advair 250/50 controllers Continue as needed albuterol nab/mdi.  Currently less than twice a week usage.   [name] is going on a cruise vacation.  I am sending her with a Z-Pak and 5-day pulse dose of Prednisone that she mey self-initiate should she develop worseing dyspnea, cough, increase sputum while on trip.  If this does not help within 6 to 12 hours, she should seek medical help.

## 2014-09-08 NOTE — Progress Notes (Signed)
Patient ID: [name], female   DOB: 03-08-1923, 81 y.o.   MRN: [id number]   Subjective:    Patient ID: [name], female    DOB: [birth year], 81 y.o.   MRN: [id number]  HPI  Atrial Fibrillation, paroxysmal - Longstanding issue for [name] - Dr Daneen Schick (Card) has started [name] on Amiodarone.   - [name]) Anticoagulation clinic is managing her warfarin dosing and monitoring - She denies palpitation, SOB, worsening DOE, leg edema.  No orthopnea or PND.    COPD - Longstanding problem - Taking Spiriva and Advair as directed.  Cost of medication is a potential barrier to therapy. - Using albuterol neb couple times a week. - Able to perform ADLs and iADLs without assistance or supervision - Denies cough, increase sputum production - Able to walk on level ground at her own pace without stopping.      No smoking Medication list was reviewed and updated.   Review of Systems See HPI    Objective:   Physical Exam  VS reviewed GEN: Alert, Cooperative, Groomed, NAD COR: RRR, No M/G/R, No JVD, Normal PMI size and location LUNGS: BCTA, No Acc mm use, speaking in full sentences EXT: No peripheral leg edema.  Neuro: Oriented to person, place, and time Gait: Normal speed, No significant path deviation, Step through +,  Psych: Normal affect/thought/speech/language          Assessment & Plan:

## 2014-09-09 ENCOUNTER — Ambulatory Visit (INDEPENDENT_AMBULATORY_CARE_PROVIDER_SITE_OTHER): Payer: Medicare Other

## 2014-09-09 DIAGNOSIS — Z5181 Encounter for therapeutic drug level monitoring: Secondary | ICD-10-CM | POA: Diagnosis not present

## 2014-09-09 DIAGNOSIS — I480 Paroxysmal atrial fibrillation: Secondary | ICD-10-CM | POA: Diagnosis not present

## 2014-09-09 LAB — POCT INR: INR: 2

## 2014-09-10 ENCOUNTER — Telehealth: Payer: Self-pay | Admitting: Family Medicine

## 2014-09-10 DIAGNOSIS — N179 Acute kidney failure, unspecified: Secondary | ICD-10-CM

## 2014-09-10 DIAGNOSIS — N1831 Chronic kidney disease, stage 3a: Secondary | ICD-10-CM

## 2014-09-10 DIAGNOSIS — N183 Chronic kidney disease, stage 3 (moderate): Secondary | ICD-10-CM

## 2014-09-10 NOTE — Telephone Encounter (Signed)
I discussed the slight increase in serum creatinine with [name] by phone. She has been taking Lasix 40 mg tablet twice a week for non-cardiac/renal/liver bilateral leg edema. She denies taking OTC NSAIDs I asked her to stop the Lasix which she said she would do.  She will return next week to [facility] lab to have monitoring Renal Panel.

## 2014-09-11 DIAGNOSIS — H3532 Exudative age-related macular degeneration: Secondary | ICD-10-CM | POA: Diagnosis not present

## 2014-09-15 ENCOUNTER — Other Ambulatory Visit: Payer: Medicare Other

## 2014-09-15 DIAGNOSIS — N183 Chronic kidney disease, stage 3 (moderate): Secondary | ICD-10-CM | POA: Diagnosis not present

## 2014-09-15 DIAGNOSIS — N1831 Chronic kidney disease, stage 3a: Secondary | ICD-10-CM

## 2014-09-15 DIAGNOSIS — N179 Acute kidney failure, unspecified: Secondary | ICD-10-CM | POA: Diagnosis not present

## 2014-09-15 LAB — RENAL FUNCTION PANEL
ALBUMIN: 3.5 g/dL (ref 3.5–5.2)
BUN: 25 mg/dL — ABNORMAL HIGH (ref 6–23)
CHLORIDE: 106 meq/L (ref 96–112)
CO2: 27 meq/L (ref 19–32)
Calcium: 9.2 mg/dL (ref 8.4–10.5)
Creat: 1.4 mg/dL — ABNORMAL HIGH (ref 0.50–1.10)
Glucose, Bld: 72 mg/dL (ref 70–99)
Phosphorus: 3.7 mg/dL (ref 2.3–4.6)
Potassium: 4.2 mEq/L (ref 3.5–5.3)
Sodium: 143 mEq/L (ref 135–145)

## 2014-09-15 NOTE — Progress Notes (Signed)
Renal profile  Done today [name]

## 2014-09-16 ENCOUNTER — Ambulatory Visit (INDEPENDENT_AMBULATORY_CARE_PROVIDER_SITE_OTHER): Payer: Medicare Other | Admitting: *Deleted

## 2014-09-16 DIAGNOSIS — I480 Paroxysmal atrial fibrillation: Secondary | ICD-10-CM

## 2014-09-16 DIAGNOSIS — Z5181 Encounter for therapeutic drug level monitoring: Secondary | ICD-10-CM

## 2014-09-16 DIAGNOSIS — H3532 Exudative age-related macular degeneration: Secondary | ICD-10-CM | POA: Diagnosis not present

## 2014-09-16 LAB — POCT INR: INR: 1.8

## 2014-09-19 DIAGNOSIS — L821 Other seborrheic keratosis: Secondary | ICD-10-CM | POA: Diagnosis not present

## 2014-09-19 DIAGNOSIS — D1722 Benign lipomatous neoplasm of skin and subcutaneous tissue of left arm: Secondary | ICD-10-CM | POA: Diagnosis not present

## 2014-09-19 DIAGNOSIS — L814 Other melanin hyperpigmentation: Secondary | ICD-10-CM | POA: Diagnosis not present

## 2014-09-19 DIAGNOSIS — L820 Inflamed seborrheic keratosis: Secondary | ICD-10-CM | POA: Diagnosis not present

## 2014-09-19 DIAGNOSIS — D485 Neoplasm of uncertain behavior of skin: Secondary | ICD-10-CM | POA: Diagnosis not present

## 2014-09-22 ENCOUNTER — Other Ambulatory Visit: Payer: Self-pay | Admitting: Family Medicine

## 2014-09-23 ENCOUNTER — Ambulatory Visit (INDEPENDENT_AMBULATORY_CARE_PROVIDER_SITE_OTHER): Payer: Medicare Other

## 2014-09-23 DIAGNOSIS — Z5181 Encounter for therapeutic drug level monitoring: Secondary | ICD-10-CM | POA: Diagnosis not present

## 2014-09-23 DIAGNOSIS — I480 Paroxysmal atrial fibrillation: Secondary | ICD-10-CM

## 2014-09-23 LAB — POCT INR: INR: 1.9

## 2014-09-25 NOTE — Telephone Encounter (Signed)
2nd request.  Braley Luckenbaugh L, RN  

## 2014-10-07 ENCOUNTER — Ambulatory Visit (INDEPENDENT_AMBULATORY_CARE_PROVIDER_SITE_OTHER): Payer: Medicare Other

## 2014-10-07 DIAGNOSIS — Z5181 Encounter for therapeutic drug level monitoring: Secondary | ICD-10-CM | POA: Diagnosis not present

## 2014-10-07 DIAGNOSIS — I480 Paroxysmal atrial fibrillation: Secondary | ICD-10-CM | POA: Diagnosis not present

## 2014-10-07 LAB — POCT INR: INR: 1.5

## 2014-10-17 DIAGNOSIS — M7981 Nontraumatic hematoma of soft tissue: Secondary | ICD-10-CM | POA: Diagnosis not present

## 2014-10-17 DIAGNOSIS — L821 Other seborrheic keratosis: Secondary | ICD-10-CM | POA: Diagnosis not present

## 2014-10-17 DIAGNOSIS — D485 Neoplasm of uncertain behavior of skin: Secondary | ICD-10-CM | POA: Diagnosis not present

## 2014-10-21 ENCOUNTER — Ambulatory Visit (INDEPENDENT_AMBULATORY_CARE_PROVIDER_SITE_OTHER): Payer: Medicare Other

## 2014-10-21 DIAGNOSIS — Z5181 Encounter for therapeutic drug level monitoring: Secondary | ICD-10-CM

## 2014-10-21 DIAGNOSIS — I480 Paroxysmal atrial fibrillation: Secondary | ICD-10-CM | POA: Diagnosis not present

## 2014-10-21 LAB — POCT INR: INR: 1.8

## 2014-10-23 ENCOUNTER — Ambulatory Visit (INDEPENDENT_AMBULATORY_CARE_PROVIDER_SITE_OTHER): Payer: Medicare Other | Admitting: Family Medicine

## 2014-10-23 ENCOUNTER — Encounter: Payer: Self-pay | Admitting: Family Medicine

## 2014-10-23 VITALS — BP 124/76 | HR 87 | Temp 97.9°F | Ht 64.5 in | Wt 158.2 lb

## 2014-10-23 DIAGNOSIS — D509 Iron deficiency anemia, unspecified: Secondary | ICD-10-CM

## 2014-10-23 DIAGNOSIS — N183 Chronic kidney disease, stage 3 unspecified: Secondary | ICD-10-CM

## 2014-10-23 DIAGNOSIS — I2510 Atherosclerotic heart disease of native coronary artery without angina pectoris: Secondary | ICD-10-CM

## 2014-10-23 DIAGNOSIS — T148 Other injury of unspecified body region: Secondary | ICD-10-CM | POA: Diagnosis not present

## 2014-10-23 DIAGNOSIS — T148XXA Other injury of unspecified body region, initial encounter: Secondary | ICD-10-CM

## 2014-10-23 MED ORDER — CEPHALEXIN 500 MG PO CAPS: 500.0000 mg | ORAL_CAPSULE | Freq: Four times a day (QID) | ORAL | Status: AC

## 2014-10-23 NOTE — Patient Instructions (Addendum)
Please pick up a compression stocking (over-the-counter) to wear on left leg whenever you are up. Keep blood blister covered with thick yellow bandage until healed.   Take the Cephalexin antibiotics for next 5 days to treat a possible skin infection around the blood blister.  Do not pop the blood blister.

## 2014-10-24 ENCOUNTER — Encounter: Payer: Self-pay | Admitting: Family Medicine

## 2014-10-24 DIAGNOSIS — L97329 Non-pressure chronic ulcer of left ankle with unspecified severity: Secondary | ICD-10-CM

## 2014-10-24 DIAGNOSIS — I83023 Varicose veins of left lower extremity with ulcer of ankle: Secondary | ICD-10-CM | POA: Insufficient documentation

## 2014-10-24 HISTORY — DX: Varicose veins of left lower extremity with ulcer of ankle: I83.023

## 2014-10-24 HISTORY — DX: Non-pressure chronic ulcer of left ankle with unspecified severity: L97.329

## 2014-10-24 NOTE — Assessment & Plan Note (Signed)
Established problem Asymptomatic Monitoring CBC and Ferritin today Currently [name] is not taking iron supplements.

## 2014-10-24 NOTE — Progress Notes (Signed)
Patient ID: NERIYAH POUPARD, female   DOB: 03-Jun-1923, 81 y.o.   MRN: [id number]   Subjective:    Patient ID: [name], female    DOB: [birth year], 81 y.o.   MRN: [id number]  Leg Pain   Onset: two weeks ago with development of blood blister on right lower leg Quality: painful with walking Severity: mild to moderate Function: no impairment in walking or sleeping  Course: blister may be decreasingt in size Radiation: no Relief: leg elevation Precipitant: no recalled trauma.  She has noted increase in left ankle edema with stopping lasix Associated Symptoms: no fever Trauma (Acute or Chronic): no Prior Diagnostic Testing or Treatments: INR 1.8 recently Relevant PMH/PSH: venous insufficient and anticoagulation with VKA   COPD - Longstanding problem - Taking Spiriva and Advair as directed.   - Using albuterol neb couple times a week. - Able to perform ADLs and iADLs without assistance or supervision - Denies cough, increase sputum production - Able to walk on level ground at her own pace without stopping.      No smoking Medication list was reviewed and updated.   Review of Systems See HPI    Objective:   Physical Exam  VS reviewed GEN: Alert, Cooperative, Groomed, NAD COR: RRR, No M/G/R, No JVD, Normal PMI size and location LUNGS: BCTA, No Acc mm use, speaking in full sentences EXT: blood filled vescicle left medial lower leg; approx 2.5 to 3 cm in diameter, circular shape, flaccidly fluctuant. Surrounding 1 cm of erythmeatous skin that is slightly tender and warm to touch.  Ankle edema left  > right  ankle edema.  No palpable popliteal abnormality or cord. (-) Homan left   Neuro: Oriented to person, place, and time Gait: Normal speed, No significant path deviation, Step through +,  Psych: Normal affect/thought/speech/language     Assessment & Plan:

## 2014-10-24 NOTE — Assessment & Plan Note (Signed)
New problem No further workup planned  Suspect origin of blister is anticoagulation and holding of Lasix and dependent edema from increased standing during cruise vacation  Recommend Placed Colloidal dressing over blister.  Keep in place to protect site. Ted hose with 14 mmHg compression to left foreleg, knee high. Leg elevation throughtout day.  Red flag conditions to re-seek care.

## 2014-10-24 NOTE — Assessment & Plan Note (Addendum)
Lab Results  Component Value Date   CREATININE 1.40* 09/15/2014  MDRD GFR 38 ml/min Patient holding lasix Await new Renal panel. Will restart low dose of Lasix of edema of ankles Will also check PTH level

## 2014-10-27 ENCOUNTER — Telehealth: Payer: Self-pay | Admitting: Family Medicine

## 2014-10-27 DIAGNOSIS — D509 Iron deficiency anemia, unspecified: Secondary | ICD-10-CM

## 2014-10-27 DIAGNOSIS — N183 Chronic kidney disease, stage 3 unspecified: Secondary | ICD-10-CM

## 2014-10-27 NOTE — Telephone Encounter (Signed)
Put a patch over leg last Thurs, it's starting to pour out blood and is leaking and does not know what to do or how to handle it.

## 2014-10-27 NOTE — Telephone Encounter (Signed)
Patient states that she has a new patch to use and advised to place until her same day appt tomorrow.  Pt is no longer complaining of pain in that leg just the oozing.  Will forward to MD to make him aware that patient will be seeing Dr. Lorenso Courier at 10am tomorrow.  [name],CMA

## 2014-10-28 ENCOUNTER — Encounter: Payer: Self-pay | Admitting: Family Medicine

## 2014-10-28 ENCOUNTER — Ambulatory Visit (INDEPENDENT_AMBULATORY_CARE_PROVIDER_SITE_OTHER): Payer: Medicare Other | Admitting: Family Medicine

## 2014-10-28 ENCOUNTER — Other Ambulatory Visit: Payer: Self-pay | Admitting: Family Medicine

## 2014-10-28 VITALS — BP 118/54 | HR 64 | Temp 98.0°F | Ht 64.0 in | Wt 159.0 lb

## 2014-10-28 DIAGNOSIS — T148 Other injury of unspecified body region: Secondary | ICD-10-CM | POA: Diagnosis not present

## 2014-10-28 DIAGNOSIS — I2510 Atherosclerotic heart disease of native coronary artery without angina pectoris: Secondary | ICD-10-CM

## 2014-10-28 DIAGNOSIS — N183 Chronic kidney disease, stage 3 (moderate): Secondary | ICD-10-CM | POA: Diagnosis not present

## 2014-10-28 DIAGNOSIS — T148XXA Other injury of unspecified body region, initial encounter: Secondary | ICD-10-CM

## 2014-10-28 DIAGNOSIS — D509 Iron deficiency anemia, unspecified: Secondary | ICD-10-CM | POA: Diagnosis not present

## 2014-10-28 LAB — RENAL FUNCTION PANEL
Albumin: 3.8 g/dL (ref 3.6–5.1)
BUN: 26 mg/dL — ABNORMAL HIGH (ref 7–25)
CO2: 28 mmol/L (ref 20–31)
CREATININE: 1.46 mg/dL — AB (ref 0.60–0.88)
Calcium: 9.3 mg/dL (ref 8.6–10.4)
Chloride: 102 mmol/L (ref 98–110)
Glucose, Bld: 103 mg/dL — ABNORMAL HIGH (ref 65–99)
POTASSIUM: 4.1 mmol/L (ref 3.5–5.3)
Phosphorus: 3.7 mg/dL (ref 2.1–4.3)
Sodium: 138 mmol/L (ref 135–146)

## 2014-10-28 LAB — CBC
HCT: 36.8 % (ref 36.0–46.0)
Hemoglobin: 11.9 g/dL — ABNORMAL LOW (ref 12.0–15.0)
MCH: 29.8 pg (ref 26.0–34.0)
MCHC: 32.3 g/dL (ref 30.0–36.0)
MCV: 92.2 fL (ref 78.0–100.0)
MPV: 9.4 fL (ref 8.6–12.4)
PLATELETS: 297 10*3/uL (ref 150–400)
RBC: 3.99 MIL/uL (ref 3.87–5.11)
RDW: 14.1 % (ref 11.5–15.5)
WBC: 6.7 10*3/uL (ref 4.0–10.5)

## 2014-10-28 LAB — FERRITIN: FERRITIN: 209 ng/mL (ref 10–291)

## 2014-10-28 NOTE — Progress Notes (Signed)
Patient ID: [name], female   DOB: 1923/05/30, 81 y.o.   MRN: [id number]    Subjective: CC: f/u blood blister HPI: Patient is a 81 y.o. female with a past medical history of atrial fibrillation presenting to clinic today for a f/u on a blood blister.  Patient noted a black blood blister approximately 2-3 weeks ago on the left leg.. She saw her PCP on 8/26 who put on colloid dressings and started her on Keflex as there was some surrounding redness secondary to possible infection vs secondary to edema.  She noted that yesterday the blister popped and has been bleeding. She was instructed to continue to use the colloid dressings that she was given in clinic and to follow up to day. There was pain yesterday after the blister ruptured but the shooting pain has resolved. No fevers or chills. She feels the surrounding redness has been stable.   She has also hit her right leg against the furniture a few days ago and now has some blackening of the tissue with bleeding there as well. It has been contained with guaze.   She is on VKA for a fib. She never got compression stockings.   Social History: former smoker   ROS: All other systems reviewed and are negative.  Past Medical History Patient Active Problem List   Diagnosis Date Noted  . Blood blister, left foreleg 10/24/2014  . Abrasion 08/07/2014  . Pure hypercholesterolemia 07/31/2014  . Encounter for therapeutic drug monitoring 06/19/2014  . CAD (coronary artery disease), native coronary artery 06/08/2014  . On warfarin therapy 06/01/2014  . Pre-operative cardiovascular examination 04/08/2014  . Pain in joint, upper arm 01/09/2014  . Loss of weight 12/12/2013  . Seborrheic keratosis, right anterior thigh 12/12/2013  . Low back pain syndrome   . Pacemaker 04/02/2013  . Mild cognitive impairment 10/26/2012  . Transitional cell carcinoma of ureter, history   . Chronic anticoagulation 10/01/2012  . High risk medications (not  anticoagulants) long-term use 03/05/2012  . Urge incontinence 12/13/2011  . AF (paroxysmal atrial fibrillation) 11/11/2010  . Vitamin D deficiency 11/02/2010  . Macular degeneration, bilateral 10/04/2010    Class: Chronic  . Iron deficiency anemia 08/05/2010  . Solitary kidney, acquired 05/17/2010  . Spondylolisthesis of lumbar region   . VITAMIN B12 DEFICIENCY 10/07/2009  . CHRONIC KIDNEY DISEASE STAGE III (MODERATE) 09/02/2009  . MYOCARDIAL INFARCTION, HX OF 09/25/2008  . VENTRICULAR HYPERTROPHY, LEFT 08/28/2008  . At risk for diabetes mellitus 09/11/2007  . HYPERTENSION, BENIGN ESSENTIAL 06/18/2007  . ANXIETY 04/27/2006  . SICK SINUS SYNDROME 04/27/2006  . RHINITIS, ALLERGIC 04/27/2006  . COPD type B 04/27/2006  . GASTROESOPHAGEAL REFLUX, NO ESOPHAGITIS 04/27/2006  . OSTEOARTHRITIS, LOWER LEG 04/27/2006  . OSTEOARTHRITIS OF SPINE, NOS 04/27/2006  . LUMBAR SPINAL STENOSIS 04/27/2006    Medications- reviewed and updated Current Outpatient Prescriptions  Medication Sig Dispense Refill  . ADVAIR DISKUS 250-50 MCG/DOSE AEPB Inhale 2 puffs into the lungs 2 (two) times daily.     Marland Kitchen albuterol (PROVENTIL) (2.5 MG/3ML) 0.083% nebulizer solution USE 3 ML VIA NEBULIZER EVERY 6 HOURS AS NEEDED FOR WHEEZING OR SHORTNESS OF BREATH 3375 mL 0  . albuterol (VENTOLIN HFA) 108 (90 BASE) MCG/ACT inhaler Inhale 2 puffs into the lungs every 6 (six) hours as needed. If needed for shortness of breath. 1 Inhaler 3  . amiodarone (PACERONE) 200 MG tablet Take 1 tablet by mouth daily.  3  . atorvastatin (LIPITOR) 40 MG tablet Take 1 tablet (40  mg total) by mouth daily. 90 tablet 3  . azithromycin (ZITHROMAX) 250 MG tablet Two tab by mouth initially, then one daily for 4 more days. 6 tablet 0  . cholecalciferol (VITAMIN D) 1000 UNITS tablet Take 1,000 Units by mouth daily.    . furosemide (LASIX) 40 MG tablet Take 40 mg by mouth once a week. 1 tablet once a week    . hydrocortisone valerate cream (WESTCORT)  0.2 % Apply 1 application topically daily. (Patient not taking: Reported on 10/23/2014) 45 g 1  . LORazepam (ATIVAN) 0.5 MG tablet TAKE 1 TABLET BY MOUTH EVERY 8 HOURS AS NEEDED FOR ANXIETY 30 tablet 3  . metoprolol succinate (TOPROL-XL) 50 MG 24 hr tablet Take 1 tablet (50 mg total) by mouth daily. with food 90 tablet 3  . METOPROLOL TARTRATE PO Take 25 mg by mouth as needed.     . nitroGLYCERIN (NITROSTAT) 0.4 MG SL tablet Place 0.4 mg under the tongue every 5 (five) minutes as needed for chest pain.    . predniSONE (DELTASONE) 20 MG tablet Take 2 tablets (40 mg total) by mouth daily with breakfast. (Patient not taking: Reported on 10/23/2014) 10 tablet 0  . tiotropium (SPIRIVA) 18 MCG inhalation capsule Place 1 capsule (18 mcg total) into inhaler and inhale daily. 30 capsule PRN  . traMADol (ULTRAM) 50 MG tablet Take 1 tablet (50 mg total) by mouth every 6 (six) hours as needed for moderate pain. 30 tablet 1  . vitamin B-12 (CYANOCOBALAMIN) 1000 MCG tablet Take 1,000 mcg by mouth daily.     Marland Kitchen warfarin (COUMADIN) 1 MG tablet TAKE AS DIRECTED BY ANTICOAGULATION CLINIC 270 tablet 0   No current facility-administered medications for this visit.    Objective: Office vital signs reviewed. BP 118/54 mmHg  Pulse 64  Temp(Src) 98 F (36.7 C) (Oral)  Ht 5\' 4"  (1.626 m)  Wt 159 lb (72.122 kg)  BMI 27.28 kg/m2   Physical Examination:  General: Awake, alert, well- nourished, NAD Skin: On the medial aspect of the left lower extremity there is appox a 3.5x 3 cm in diameter circular lesion from the rupture blister- this region is composed of some exposed subcutaneous fat and surrounded with what appears to be some granulation tissue. There is some serosanguinous fluid initially but this stops. Theres is approximately 2cm of surrounding skin that is erythematous and tender/warm to touch. On the right anterior shin there is an approximately 3x4 cm region of either missing epidermis or blackened epidermis.  There is pink tissue under the blackened epidermis. It continues to ozze a small amount of blood. No surrounding erythema.  2+ pitting edema bilaterally up to midshin, on the L>R   Assessment/Plan: Blood blister, left foreleg Patient presenting after rupture of the left lower leg blood blister. This region is hemostatic now without evidence of superimposed infection. I suspect the surrounding erythema is secondary to poor circular and venous stasis. Patient evaluated with Dr. Wendy Poet, her PCP. She is also presenting with mild bleeding of the right  anterior shin after trauma to that region.  Continue Colloidal dressing over ruptured blister and on new R anterior shin area. Keep in place to protect site. Ted hose with 14 mmHg compression to left foreleg, knee high and now to right leg as well- patient urged to pick these up. Leg elevation throughtout day.  Red flags discussed to re-seek care, otherwise patient to f/u with her PCP in approximately 2 weeks.  Patient to get her INR checked  at [facility] today.     No orders of the defined types were placed in this encounter.    No orders of the defined types were placed in this encounter.    Archie Patten PGY-2, [place]

## 2014-10-28 NOTE — Patient Instructions (Signed)
Continue to use the DuoDerm patches that we provided you. Get compression stockings for both legs and elevate them. Please follow up with [place] in 1 week or sooner if you notice abnormal drainage that is foul smelling, increase pain, bleeding that will not stop, fevers, or chills.

## 2014-10-28 NOTE — Telephone Encounter (Signed)
I spoke with [name] yesterday around 4 pm by phone. She related that the blood-filled bulla burst  Pain had resolved with leg elevation She had just started the Keflex for possible non-purulent cellulitis around bulloa circumference (which may have also been healing vascular hyperreactivity) that was prescribed by Dr McDiarmid on office visit 10/23/14.  Advised patient to keep leg elevated Change Colloid dressing since it was leaking the serosanguinous fluid. Wrap dressing with first aid wrap to provide mild compression.  Follow up with [facility] tomorrow for re-evaluation of wound. Continue Warfarin  Come to [facility] 8/20 [facility] for wound re-evaluation and INR check.  Any changes in dosage of Warfarin should be discussed with [facility] who manages her anticoagulation for her paroxysmal atrial fibrillation.

## 2014-10-29 ENCOUNTER — Other Ambulatory Visit: Payer: Self-pay | Admitting: Family Medicine

## 2014-10-29 ENCOUNTER — Encounter: Payer: Self-pay | Admitting: Family Medicine

## 2014-10-29 ENCOUNTER — Ambulatory Visit (INDEPENDENT_AMBULATORY_CARE_PROVIDER_SITE_OTHER): Payer: Medicare Other | Admitting: *Deleted

## 2014-10-29 DIAGNOSIS — Z5181 Encounter for therapeutic drug level monitoring: Secondary | ICD-10-CM | POA: Diagnosis not present

## 2014-10-29 DIAGNOSIS — Z1231 Encounter for screening mammogram for malignant neoplasm of breast: Secondary | ICD-10-CM | POA: Diagnosis not present

## 2014-10-29 DIAGNOSIS — I480 Paroxysmal atrial fibrillation: Secondary | ICD-10-CM

## 2014-10-29 LAB — PARATHYROID HORMONE, INTACT (NO CA): PTH: 62 pg/mL (ref 14–64)

## 2014-10-29 LAB — POCT INR: INR: 1.5

## 2014-10-29 MED ORDER — WARFARIN SODIUM 1 MG PO TABS: ORAL_TABLET | ORAL | Status: DC

## 2014-10-29 NOTE — Assessment & Plan Note (Signed)
Patient presenting after rupture of the left lower leg blood blister. This region is hemostatic now without evidence of superimposed infection. I suspect the surrounding erythema is secondary to poor circular and venous stasis. Patient evaluated with Dr. Wendy Poet, her PCP. She is also presenting with mild bleeding of the right  anterior shin after trauma to that region.  Continue Colloidal dressing over ruptured blister and on new R anterior shin area. Keep in place to protect site. Ted hose with 14 mmHg compression to left foreleg, knee high and now to right leg as well- patient urged to pick these up. Leg elevation throughtout day.  Red flags discussed to re-seek care, otherwise patient to f/u with her PCP in approximately 2 weeks.  Patient to get her INR checked at [facility] today.

## 2014-11-04 ENCOUNTER — Other Ambulatory Visit: Payer: Self-pay | Admitting: Family Medicine

## 2014-11-04 ENCOUNTER — Ambulatory Visit (INDEPENDENT_AMBULATORY_CARE_PROVIDER_SITE_OTHER): Payer: Medicare Other | Admitting: Family Medicine

## 2014-11-04 VITALS — BP 156/76 | HR 96 | Temp 97.9°F | Wt 160.2 lb

## 2014-11-04 DIAGNOSIS — L97909 Non-pressure chronic ulcer of unspecified part of unspecified lower leg with unspecified severity: Principal | ICD-10-CM

## 2014-11-04 DIAGNOSIS — I2510 Atherosclerotic heart disease of native coronary artery without angina pectoris: Secondary | ICD-10-CM | POA: Diagnosis not present

## 2014-11-04 DIAGNOSIS — I83009 Varicose veins of unspecified lower extremity with ulcer of unspecified site: Secondary | ICD-10-CM

## 2014-11-04 MED ORDER — DOXYCYCLINE HYCLATE 100 MG PO TABS: 100.0000 mg | ORAL_TABLET | Freq: Two times a day (BID) | ORAL | Status: DC

## 2014-11-04 NOTE — Patient Instructions (Addendum)
Plan to follow up in 1-2 weeks for wound care.  If you develop fever or pain, please be seen back in office sooner.    Ashly M. Lajuana Ripple, DO PGY-2, [facility] Stasis Ulcer Stasis ulcers occur in the legs when the circulation is damaged. An ulcer may look like a small hole in the skin.  CAUSES Stasis ulcers occur because your veins do not work properly. Veins have valves that help the blood return to the heart. If these valves do not work right, blood flows backwards and backs up into the veins near the skin. This condition causes the veins to become larger because of increased pressure and may lead to a stasis ulcer. SYMPTOMS   Shallow (superficial) sore on the leg.  Clear drainage or weeping from the sore.  Leg pain or a feeling of heaviness. This may be worse at the end of the day.  Leg swelling.  Skin color changes. DIAGNOSIS  Your caregiver will make a diagnosis by examining your leg. Your caregiver may order tests such as an ultrasound or other studies to evaluate the blood flow of the leg. HOME CARE INSTRUCTIONS   Do not stand or sit in one position for long periods of time. Do not sit with your legs crossed. Rest with your legs raised during the day. If possible, it is best if you can elevate your legs above your heart for 30 minutes, 3 to 4 times a day.  Wear elastic stockings or support hose. Do not wear other tight encircling garments around legs, pelvis, or waist. This causes increased pressure in your veins. If your caregiver has applied compressive medicated wraps, use them as instructed.  Walk as much as possible to increase blood flow. If you are taking long rides in a car or plane, take a break to walk around every 2 hours. If not already on aspirin, take a baby aspirin before long trips unless you have medical reasons that prohibit this.  Raise the foot of your bed at night with 2-inch blocks if approved by your caregiver. This may not be desirable if you  have heart failure or breathing problems.  If you get a cut in the skin over the vein and the vein bleeds, lie down with your leg raised and gently clean the area with a clean cloth. Apply pressure on the cut until the bleeding stops. Then place a dressing on the cut. See your caregiver if it continues to bleed or needs stitches. Also, see your caregiver if you develop an infection.Signs of an infection include a fever, redness, increased pain, and drainage of pus.  If your caregiver has given you a follow-up appointment, it is very important to keep that appointment. Not keeping the appointment could result in a chronic or permanent injury, pain, and disability. If there is any problem keeping the appointment, call your caregiver for assistance. SEEK IMMEDIATE MEDICAL CARE IF:  The ulcer area starts to break down.  You have pain, redness, tenderness, pus, or hard swelling in your leg over a vein or near the ulcer.  Your leg pain is uncomfortable.  You develop an unexplained fever.  You develop chest pain or shortness of breath. Document Released: 11/09/2000 Document Revised: 05/09/2011 Document Reviewed: 06/06/2010 [facility] Patient Information 2015 [place], [place]. This information is not intended to replace advice given to you by your health care provider. Make sure you discuss any questions you have with your health care provider.  Venous Stasis or Chronic Venous Insufficiency  Chronic venous insufficiency, also called venous stasis, is a condition that affects the veins in the legs. The condition prevents blood from being pumped through these veins effectively. Blood may no longer be pumped effectively from the legs back to the heart. This condition can range from mild to severe. With proper treatment, you should be able to continue with an active life. CAUSES  Chronic venous insufficiency occurs when the vein walls become stretched, weakened, or damaged or when valves within the vein are  damaged. Some common causes of this include:  High blood pressure inside the veins (venous hypertension).  Increased blood pressure in the leg veins from long periods of sitting or standing.  A blood clot that blocks blood flow in a vein (deep vein thrombosis).  Inflammation of a superficial vein (phlebitis) that causes a blood clot to form. RISK FACTORS Various things can make you more likely to develop chronic venous insufficiency, including:  Family history of this condition.  Obesity.  Pregnancy.  Sedentary lifestyle.  Smoking.  Jobs requiring long periods of standing or sitting in one place.  Being a certain age. Women in their 9s and 18s and men in their 78s are more likely to develop this condition. SIGNS AND SYMPTOMS  Symptoms may include:   Varicose veins.  Skin breakdown or ulcers.  Reddened or discolored skin on the leg.  Brown, smooth, tight, and painful skin just above the ankle, usually on the inside surface (lipodermatosclerosis).  Swelling. DIAGNOSIS  To diagnose this condition, your health care provider will take a medical history and do a physical exam. The following tests may be ordered to confirm the diagnosis:  Duplex ultrasound--A procedure that produces a picture of a blood vessel and nearby organs and also provides information on blood flow through the blood vessel.  Plethysmography--A procedure that tests blood flow.  A venogram, or venography--A procedure used to look at the veins using X-ray and dye. TREATMENT The goals of treatment are to help you return to an active life and to minimize pain or disability. Treatment will depend on the severity of the condition. Medical procedures may be needed for severe cases. Treatment options may include:   Use of compression stockings. These can help with symptoms and lower the chances of the problem getting worse, but they do not cure the problem.  Sclerotherapy--A procedure involving an injection  of a material that "dissolves" the damaged veins. Other veins in the network of blood vessels take over the function of the damaged veins.  Surgery to remove the vein or cut off blood flow through the vein (vein stripping or laser ablation surgery).  Surgery to repair a valve. HOME CARE INSTRUCTIONS   Wear compression stockings as directed by your health care provider.  Only take over-the-counter or prescription medicines for pain, discomfort, or fever as directed by your health care provider.  Follow up with your health care provider as directed. SEEK MEDICAL CARE IF:   You have redness, swelling, or increasing pain in the affected area.  You see a red streak or line that extends up or down from the affected area.  You have a breakdown or loss of skin in the affected area, even if the breakdown is small.  You have an injury to the affected area. SEEK IMMEDIATE MEDICAL CARE IF:   You have an injury and open wound in the affected area.  Your pain is severe and does not improve with medicine.  You have sudden numbness or weakness in  the foot or ankle below the affected area, or you have trouble moving your foot or ankle.  You have a fever or persistent symptoms for more than 2-3 days.  You have a fever and your symptoms suddenly get worse. MAKE SURE YOU:   Understand these instructions.  Will watch your condition.  Will get help right away if you are not doing well or get worse. Document Released: 06/20/2006 Document Revised: 12/05/2012 Document Reviewed: 10/22/2012 [facility] Patient Information 2015 [place], [place]. This information is not intended to replace advice given to you by your health care provider. Make sure you discuss any questions you have with your health care provider.

## 2014-11-04 NOTE — Progress Notes (Signed)
    Subjective: CC: LE wound HPI: Patient is a 81 y.o. female presenting to clinic today for same day appt. Concerns today include:  1. LE wounds Patient reports that she is on Coumadin.  Wound initially started on LLE as a black area.  She was seen in clinic for this.  Was prescribed cephalexin at last visit and finished last week.  She did not notice any improvement with medication.  She reports that redness seems to be increasing.  LLE more painful than the RLE.  She was instructed to use compression stockings on LLE but could not tolerate.  Denies fevers, chills.  Social History Reviewed: non smoker. FamHx and MedHx updated.  Please see EMR.  ROS: All other systems reviewed and are negative.  Objective: Office vital signs reviewed. BP 156/76 mmHg  Pulse 96  Temp(Src) 97.9 F (36.6 C) (Oral)  Wt 160 lb 3.2 oz (72.666 kg)  Physical Examination:  General: Awake, alert, well nourished, NAD HEENT: Normal, EOMI Extremities: app 1 inch healing lesion on left medial LE just above malleolus.  +erythema surrounding lesion.  No TTP.  App 0.25 x0.75cm lesion on R anterior shin.  Clear exudate from wound.  Tissue appears to be granulation tissue. Erythema to lower shin appreciated on RLE.  No TTP. +1 pedal pulses b/l.   MSK: Normal gait and station Skin: dry, intact, no rashes or lesions  Assessment/ Plan: 81 y.o. female with  1. Venous stasis ulcer, unspecified laterality.  Cannot r/o mild cellulitis vs erythematous changes 2/2 venous stasis.  Wound redressed during today's visit. - Last Cr & INR reviewed.  INR 1.5 so do not suspect that patient will have excessive bleeding with Doxy in the setting of coumadin. - Encouraged compression hose, but unsure that patient will actually wear. - doxycycline (VIBRA-TABS) 100 MG tablet; Take 1 tablet (100 mg total) by mouth 2 (two) times daily.  Dispense: 14 tablet; Refill: 0 - Patient to have INR check this Friday.   - Return precautions  reviewed. - F/u with pcp in 1 week for dressing change.   Janora Norlander, DO PGY-2, [place]

## 2014-11-05 NOTE — Progress Notes (Signed)
I was available as preceptor to resident for this patient's office visit.  

## 2014-11-06 ENCOUNTER — Ambulatory Visit: Payer: Medicare Other

## 2014-11-07 ENCOUNTER — Ambulatory Visit (INDEPENDENT_AMBULATORY_CARE_PROVIDER_SITE_OTHER): Payer: Medicare Other | Admitting: *Deleted

## 2014-11-07 ENCOUNTER — Other Ambulatory Visit: Payer: Self-pay | Admitting: Family Medicine

## 2014-11-07 DIAGNOSIS — I480 Paroxysmal atrial fibrillation: Secondary | ICD-10-CM | POA: Diagnosis not present

## 2014-11-07 DIAGNOSIS — Z5181 Encounter for therapeutic drug level monitoring: Secondary | ICD-10-CM | POA: Diagnosis not present

## 2014-11-07 DIAGNOSIS — K219 Gastro-esophageal reflux disease without esophagitis: Secondary | ICD-10-CM

## 2014-11-07 LAB — POCT INR: INR: 1.7

## 2014-11-07 MED ORDER — OMEPRAZOLE 20 MG PO CPDR: 20.0000 mg | DELAYED_RELEASE_CAPSULE | Freq: Every day | ORAL | Status: DC

## 2014-11-11 DIAGNOSIS — H3532 Exudative age-related macular degeneration: Secondary | ICD-10-CM | POA: Diagnosis not present

## 2014-11-13 ENCOUNTER — Encounter: Payer: Self-pay | Admitting: Family Medicine

## 2014-11-13 ENCOUNTER — Ambulatory Visit (INDEPENDENT_AMBULATORY_CARE_PROVIDER_SITE_OTHER): Payer: Medicare Other | Admitting: Family Medicine

## 2014-11-13 VITALS — BP 126/76 | HR 83 | Temp 97.9°F | Ht 64.0 in | Wt 157.1 lb

## 2014-11-13 DIAGNOSIS — Z23 Encounter for immunization: Secondary | ICD-10-CM | POA: Diagnosis present

## 2014-11-13 DIAGNOSIS — I83003 Varicose veins of unspecified lower extremity with ulcer of ankle: Secondary | ICD-10-CM

## 2014-11-13 DIAGNOSIS — L98499 Non-pressure chronic ulcer of skin of other sites with unspecified severity: Secondary | ICD-10-CM | POA: Diagnosis not present

## 2014-11-13 DIAGNOSIS — L97309 Non-pressure chronic ulcer of unspecified ankle with unspecified severity: Secondary | ICD-10-CM

## 2014-11-13 DIAGNOSIS — I83203 Varicose veins of unspecified lower extremity with both ulcer of ankle and inflammation: Secondary | ICD-10-CM | POA: Diagnosis not present

## 2014-11-13 DIAGNOSIS — I2510 Atherosclerotic heart disease of native coronary artery without angina pectoris: Secondary | ICD-10-CM

## 2014-11-13 DIAGNOSIS — H3532 Exudative age-related macular degeneration: Secondary | ICD-10-CM | POA: Diagnosis not present

## 2014-11-13 MED ORDER — HYDROCODONE-ACETAMINOPHEN 5-325 MG PO TABS: 0.5000 | ORAL_TABLET | Freq: Three times a day (TID) | ORAL | Status: DC | PRN

## 2014-11-13 NOTE — Patient Instructions (Signed)
Our office is setting up an appointment with the [facility] to treat the wound at your ankle.  Compression of this ankle wound will help it heal.  Keeping you leg elevated as much as possible will help with healing. The average duration of venous leg ulcers to heal is around 4 months.   Go to the [facility] in [facility] to be fitted for a wrap-around knee-high compression stocking. Give them the prescription for the Juxtalite knee high compression stocking for 15 to 20 mmHg compression.  Your Medicare Part B will pay 80% of the cost of these stockings, and I suspect your [facility] may cover the remaining 20%.

## 2014-11-14 ENCOUNTER — Encounter: Payer: Self-pay | Admitting: Family Medicine

## 2014-11-14 NOTE — Progress Notes (Signed)
   Subjective:    Patient ID: [name], female    DOB: 1923/12/22, 81 y.o.   MRN: [id number]  HPI  Left Medial Ankle Wound Recheck Wound Check: Patient presents for wound check. Patient has a ulcer following a bullous formation which is located on the left lower leg(s): just superior to medial malleolus . Current symptoms: pain: moderate And mild edema. Lesion began 7 weeks ago.  Interventions to date: Colloidal dressings and support stockings which patient has been unable to tolerate. Patient has not been using colloidal dressing to wound because she changes the dressing because she thought the fluid beneath the dressing was pus.  She did not have a replacement.   Review of Systems No fever/chills No new wounds    Objective:   Physical Exam VS reviewed  Nursing notes reviewed.  Gen: Alert, groomed, cooperative Left ankle: trace edema, circular skin lesion 3 - 4 cm in diameter located just superior to the medial malleolus.  Surface of wound has a fairly uniform crust.  No expressible discharge. (+) tender to palp around margins of wound. Minimal surrounding erythema.       Assessment & Plan:

## 2014-11-14 NOTE — Assessment & Plan Note (Signed)
Established problem that is unimproved. ~ 7 weeks since bullous formation.  Working explanation of wound is a venous insufficiency ulcer. Firm adherent crust interfering with healing of wound I do not believe the wound is infected at this time.  Patient interested in going to [facility].  Will make referral to [place]. In meantime, re-covered the wound with op-site dressing as we did not have colloidal dressing. Rx for Wrap knee-high compression stocking (15 - 20 mmHg) left foreleg to be worn during the day when patient is up and about.  Directed patient to [facility] for Juxtalite compression stocking. Rx written Requesting  [facility] provide Ms Nicklaus with colloidal wound dressings, like Duoderm.  Keep leg elevated as much as possible.

## 2014-11-17 ENCOUNTER — Encounter: Payer: Self-pay | Admitting: Internal Medicine

## 2014-11-17 ENCOUNTER — Ambulatory Visit (INDEPENDENT_AMBULATORY_CARE_PROVIDER_SITE_OTHER): Payer: Medicare Other | Admitting: Internal Medicine

## 2014-11-17 ENCOUNTER — Ambulatory Visit (INDEPENDENT_AMBULATORY_CARE_PROVIDER_SITE_OTHER): Payer: Medicare Other

## 2014-11-17 DIAGNOSIS — I480 Paroxysmal atrial fibrillation: Secondary | ICD-10-CM

## 2014-11-17 DIAGNOSIS — Z5181 Encounter for therapeutic drug level monitoring: Secondary | ICD-10-CM | POA: Diagnosis not present

## 2014-11-17 DIAGNOSIS — I2510 Atherosclerotic heart disease of native coronary artery without angina pectoris: Secondary | ICD-10-CM

## 2014-11-17 DIAGNOSIS — L98491 Non-pressure chronic ulcer of skin of other sites limited to breakdown of skin: Secondary | ICD-10-CM | POA: Diagnosis present

## 2014-11-17 LAB — POCT INR: INR: 1.8

## 2014-11-17 MED ORDER — TRAMADOL HCL 50 MG PO TABS: 50.0000 mg | ORAL_TABLET | Freq: Four times a day (QID) | ORAL | Status: DC | PRN

## 2014-11-17 NOTE — Progress Notes (Signed)
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 She has been seen multiple times in clinic for this same wound, and has been on a course of Keflex x 5 days and then Doxycycline x 7 days. It started out as a blood blister, but then never healed. She was last seen by her PCP (Dr. McDiarmid) on 9/15, who thought this was likely an ulcer of chronic venous insufficiency. She was told to use colloid wound dressings and to wear compression stockings. She was also referred to [facility] She was given Norco for pain, but states that this has made her incredibly nauseous. Since 9/15, she feels like the skin around her ulcer has become more of a dark red. She is unsure if the red area has gotten bigger. She also notes new pain that shoots up her leg. She was unable to afford the compression stockings, so she has been using an Ace bandage instead. She has not tried putting any ointment or creams on the ulcer. She denies any fevers, chills, or feeling sick.  All other ROS were reviewed and are negative unless otherwise noted in the HPI. Past Medical History- significant for HTN, MI, Afib, CAD, COPD Reviewed problem list.  Medications- reviewed and updated Current Outpatient Prescriptions  Medication Sig Dispense Refill  . ADVAIR DISKUS 250-50 MCG/DOSE AEPB Inhale 2 puffs into the lungs 2 (two) times daily.     Marland Kitchen albuterol (PROVENTIL) (2.5 MG/3ML) 0.083% nebulizer solution USE 3 ML VIA NEBULIZER EVERY 6 HOURS AS NEEDED FOR WHEEZING OR SHORTNESS OF BREATH 3375 mL 0  . albuterol (VENTOLIN HFA) 108 (90 BASE) MCG/ACT inhaler Inhale 2 puffs into the lungs every 6 (six) hours as needed. If needed for shortness of breath. 1 Inhaler 3  . amiodarone (PACERONE) 200 MG tablet Take 1 tablet by mouth daily.  3  . atorvastatin (LIPITOR) 40 MG tablet  Take 1 tablet (40 mg total) by mouth daily. 90 tablet 3  . cholecalciferol (VITAMIN D) 1000 UNITS tablet Take 1,000 Units by mouth daily.    . furosemide (LASIX) 40 MG tablet Take 40 mg by mouth once a week. 1 tablet once a week    . HYDROcodone-acetaminophen (NORCO/VICODIN) 5-325 MG per tablet Take 0.5-1 tablets by mouth every 8 (eight) hours as needed for moderate pain. Of leg wound 25 tablet 0  . LORazepam (ATIVAN) 0.5 MG tablet TAKE 1 TABLET BY MOUTH EVERY 8 HOURS AS NEEDED FOR ANXIETY 30 tablet 3  . metoprolol succinate (TOPROL-XL) 50 MG 24 hr tablet Take 1 tablet (50 mg total) by mouth daily. with food 90 tablet 3  . nitroGLYCERIN (NITROSTAT) 0.4 MG SL tablet Place 0.4 mg under the tongue every 5 (five) minutes as needed for chest pain.    Marland Kitchen omeprazole (PRILOSEC) 20 MG capsule Take 1 capsule (20 mg total) by mouth daily. 90 capsule 3  . tiotropium (SPIRIVA) 18 MCG inhalation capsule Place 1 capsule (18 mcg total) into inhaler and inhale daily. 30 capsule PRN  . traMADol (ULTRAM) 50 MG tablet Take 1 tablet (50 mg total) by mouth every 6 (six) hours as needed for moderate pain. 30 tablet 1  . vitamin B-12 (CYANOCOBALAMIN) 1000 MCG tablet Take 1,000 mcg by mouth daily.     Marland Kitchen warfarin (COUMADIN) 1 MG tablet TAKE AS DIRECTED BY  ANTICOAGULATION CLINIC 270 tablet 0   No current facility-administered medications for this visit.   Chief complaint-noted Family history reviewed for today's visit. No changes. Social history- patient is a former smoker  Objective: There were no vitals taken for this visit. Gen: NAD, alert, cooperative with exam HEENT: NCAT, EOMI, MMM Neck: FROM, supple Resp: Normal work of breathing CV: 1+ DP pulses present in lower extremities bilaterally. GI: SNTND, BS present, no guarding or organomegaly Msk: No edema, moves upper and lower extremities spontaneously. Neuro: Alert and oriented, no gross deficits Skin: Round ulcer that is 4cm in diameter, granulation tissue  present over entire ulcer, no drainage, some erythema surrounding ulcer but surrounding skin is the same temperature as the rest of the leg Psych: Appropriate behavior  Assessment/Plan: See problem based a/p   Hyman Bible, MD PGY-1

## 2014-11-17 NOTE — Assessment & Plan Note (Addendum)
Chronic ulcer for the last 2 months. Pt concerned for pain that is shooting up her leg. Ulcer is round and 4cm in diameter, granulation tissue covering the entire ulcer. Wound does not appear infected. Precepted and examined with Dr. Ree Kida.  - Spoke with referral coordinator, who has scheduled an appointment for [name] at [facility] for 12-10-14. Pt advised to call daily to see if there are any cancellations for that day. - Will not prescribe antibiotics at this time. Pt given Tegaderm bandages to use at home. - Pt was previously prescribed Norco, which made her nauseated. Will give her a prescription of Tramadol. Pt has taken this in the past and has tolerated it well. - Encouraged Pt to continue to use Ace bandage for compression and to keep feet elevated as much as possible - Pt given strict return precautions - Follow-up as needed

## 2014-11-17 NOTE — Patient Instructions (Signed)
It was so nice to meet you!  Your ulcer looks like it's healing. We do not see any signs of infection today, so we do not think you need antibiotics at this time.  We have prescribed you Tylenol with Codeine for pain.  If you have any fevers, chills, feeling sick, nausea, or if you notice any spreading redness, please come back to see [place].  -Dr. Brett Albino

## 2014-11-20 ENCOUNTER — Other Ambulatory Visit: Payer: Self-pay | Admitting: Family Medicine

## 2014-11-20 NOTE — Telephone Encounter (Signed)
Patient called stating she really need a Z-pac due to being sick.  She is requesting if it could be approved before 5 PM.  She has someone to pick up her medication up for at that time.  Derl Barrow, RN

## 2014-11-26 ENCOUNTER — Other Ambulatory Visit: Payer: Self-pay | Admitting: Family Medicine

## 2014-12-03 ENCOUNTER — Ambulatory Visit (INDEPENDENT_AMBULATORY_CARE_PROVIDER_SITE_OTHER): Payer: Medicare Other | Admitting: Pharmacist

## 2014-12-03 DIAGNOSIS — I480 Paroxysmal atrial fibrillation: Secondary | ICD-10-CM | POA: Diagnosis not present

## 2014-12-03 DIAGNOSIS — Z5181 Encounter for therapeutic drug level monitoring: Secondary | ICD-10-CM

## 2014-12-03 LAB — POCT INR: INR: 1.4

## 2014-12-10 ENCOUNTER — Encounter (HOSPITAL_BASED_OUTPATIENT_CLINIC_OR_DEPARTMENT_OTHER): Payer: Medicare Other | Attending: Surgery

## 2014-12-15 ENCOUNTER — Other Ambulatory Visit: Payer: Medicare Other

## 2014-12-16 ENCOUNTER — Other Ambulatory Visit (INDEPENDENT_AMBULATORY_CARE_PROVIDER_SITE_OTHER): Payer: Medicare Other

## 2014-12-16 ENCOUNTER — Ambulatory Visit (INDEPENDENT_AMBULATORY_CARE_PROVIDER_SITE_OTHER): Payer: Medicare Other | Admitting: *Deleted

## 2014-12-16 DIAGNOSIS — Z79899 Other long term (current) drug therapy: Secondary | ICD-10-CM

## 2014-12-16 DIAGNOSIS — I10 Essential (primary) hypertension: Secondary | ICD-10-CM | POA: Diagnosis not present

## 2014-12-16 DIAGNOSIS — Z5181 Encounter for therapeutic drug level monitoring: Secondary | ICD-10-CM

## 2014-12-16 DIAGNOSIS — I480 Paroxysmal atrial fibrillation: Secondary | ICD-10-CM

## 2014-12-16 LAB — HEPATIC FUNCTION PANEL
ALBUMIN: 4 g/dL (ref 3.6–5.1)
ALT: 8 U/L (ref 6–29)
AST: 15 U/L (ref 10–35)
Alkaline Phosphatase: 59 U/L (ref 33–130)
BILIRUBIN TOTAL: 0.7 mg/dL (ref 0.2–1.2)
Bilirubin, Direct: 0.1 mg/dL (ref <=0.2)
Indirect Bilirubin: 0.6 mg/dL (ref 0.2–1.2)
Total Protein: 6.2 g/dL (ref 6.1–8.1)

## 2014-12-16 LAB — TSH: TSH: 3.133 u[IU]/mL (ref 0.350–4.500)

## 2014-12-16 LAB — POCT INR: INR: 1.5

## 2014-12-17 ENCOUNTER — Telehealth: Payer: Self-pay

## 2014-12-17 NOTE — Telephone Encounter (Signed)
Pt aware of lab results.Normal and recheck in 6 months Pt verbalized understanding.

## 2014-12-17 NOTE — Telephone Encounter (Signed)
-----   Message from Belva Crome, MD sent at 12/17/2014 11:17 AM EDT ----- Normal and recheck in 6 months

## 2014-12-29 ENCOUNTER — Ambulatory Visit (INDEPENDENT_AMBULATORY_CARE_PROVIDER_SITE_OTHER): Payer: Medicare Other | Admitting: Pharmacist

## 2014-12-29 DIAGNOSIS — I480 Paroxysmal atrial fibrillation: Secondary | ICD-10-CM | POA: Diagnosis not present

## 2014-12-29 DIAGNOSIS — Z5181 Encounter for therapeutic drug level monitoring: Secondary | ICD-10-CM | POA: Diagnosis not present

## 2014-12-29 LAB — POCT INR: INR: 1.7

## 2015-01-06 DIAGNOSIS — H353211 Exudative age-related macular degeneration, right eye, with active choroidal neovascularization: Secondary | ICD-10-CM | POA: Diagnosis not present

## 2015-01-12 ENCOUNTER — Ambulatory Visit (INDEPENDENT_AMBULATORY_CARE_PROVIDER_SITE_OTHER): Payer: Medicare Other | Admitting: *Deleted

## 2015-01-12 DIAGNOSIS — Z5181 Encounter for therapeutic drug level monitoring: Secondary | ICD-10-CM | POA: Diagnosis not present

## 2015-01-12 DIAGNOSIS — I480 Paroxysmal atrial fibrillation: Secondary | ICD-10-CM

## 2015-01-12 LAB — POCT INR: INR: 1.5

## 2015-01-13 DIAGNOSIS — H353212 Exudative age-related macular degeneration, right eye, with inactive choroidal neovascularization: Secondary | ICD-10-CM | POA: Diagnosis not present

## 2015-01-15 ENCOUNTER — Encounter: Payer: Self-pay | Admitting: Family Medicine

## 2015-01-15 ENCOUNTER — Other Ambulatory Visit: Payer: Self-pay | Admitting: Family Medicine

## 2015-01-15 ENCOUNTER — Ambulatory Visit (INDEPENDENT_AMBULATORY_CARE_PROVIDER_SITE_OTHER): Payer: Medicare Other | Admitting: Family Medicine

## 2015-01-15 VITALS — BP 104/64 | HR 68 | Temp 97.8°F | Ht 64.0 in | Wt 157.4 lb

## 2015-01-15 DIAGNOSIS — D509 Iron deficiency anemia, unspecified: Secondary | ICD-10-CM | POA: Diagnosis not present

## 2015-01-15 DIAGNOSIS — D649 Anemia, unspecified: Secondary | ICD-10-CM

## 2015-01-15 DIAGNOSIS — N183 Chronic kidney disease, stage 3 unspecified: Secondary | ICD-10-CM

## 2015-01-15 DIAGNOSIS — I2510 Atherosclerotic heart disease of native coronary artery without angina pectoris: Secondary | ICD-10-CM

## 2015-01-15 DIAGNOSIS — J449 Chronic obstructive pulmonary disease, unspecified: Secondary | ICD-10-CM | POA: Diagnosis not present

## 2015-01-15 DIAGNOSIS — I10 Essential (primary) hypertension: Secondary | ICD-10-CM

## 2015-01-15 DIAGNOSIS — Z8639 Personal history of other endocrine, nutritional and metabolic disease: Secondary | ICD-10-CM | POA: Diagnosis not present

## 2015-01-15 DIAGNOSIS — I480 Paroxysmal atrial fibrillation: Secondary | ICD-10-CM | POA: Diagnosis not present

## 2015-01-15 LAB — CBC
HEMATOCRIT: 36.9 % (ref 36.0–46.0)
HEMOGLOBIN: 11.7 g/dL — AB (ref 12.0–15.0)
MCH: 29.3 pg (ref 26.0–34.0)
MCHC: 31.7 g/dL (ref 30.0–36.0)
MCV: 92.5 fL (ref 78.0–100.0)
MPV: 9.8 fL (ref 8.6–12.4)
Platelets: 254 10*3/uL (ref 150–400)
RBC: 3.99 MIL/uL (ref 3.87–5.11)
RDW: 14.5 % (ref 11.5–15.5)
WBC: 5.7 10*3/uL (ref 4.0–10.5)

## 2015-01-15 LAB — BASIC METABOLIC PANEL
BUN: 27 mg/dL — AB (ref 7–25)
CHLORIDE: 104 mmol/L (ref 98–110)
CO2: 28 mmol/L (ref 20–31)
Calcium: 8.6 mg/dL (ref 8.6–10.4)
Creat: 1.24 mg/dL — ABNORMAL HIGH (ref 0.60–0.88)
GLUCOSE: 82 mg/dL (ref 65–99)
POTASSIUM: 4.2 mmol/L (ref 3.5–5.3)
SODIUM: 140 mmol/L (ref 135–146)

## 2015-01-15 LAB — FERRITIN: Ferritin: 155 ng/mL (ref 10–291)

## 2015-01-15 MED ORDER — TIOTROPIUM BROMIDE MONOHYDRATE 18 MCG IN CAPS: 18.0000 ug | ORAL_CAPSULE | Freq: Every day | RESPIRATORY_TRACT | Status: DC

## 2015-01-15 NOTE — Patient Instructions (Signed)
I will look into whether you can take a anticoagulation medcation called Rivaroxaban. You would not need to have your INR checked or any other blood work with this medication like you do with Warfarin.  I send a note to Dr Pernell Dupre to see if he is OK with this change.

## 2015-01-16 DIAGNOSIS — Z8639 Personal history of other endocrine, nutritional and metabolic disease: Secondary | ICD-10-CM

## 2015-01-16 HISTORY — DX: Personal history of other endocrine, nutritional and metabolic disease: Z86.39

## 2015-01-16 NOTE — Assessment & Plan Note (Signed)
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  No need to resume iron supplement.  Lab Results  Component Value Date   WBC 5.7 01/15/2015   HGB 11.7* 01/15/2015   HCT 36.9 01/15/2015   MCV 92.5 01/15/2015   PLT 254 01/15/2015

## 2015-01-16 NOTE — Assessment & Plan Note (Signed)
Lab Results  Component Value Date   CREATININE 1.24* 01/15/2015  Established problem. Improved with stopping use of Lasix.  Continue to monitor

## 2015-01-16 NOTE — Assessment & Plan Note (Signed)
Established problem. Stable. Continue current therapy Refilled Spiriva.

## 2015-01-16 NOTE — Progress Notes (Signed)
   Subjective:    Patient ID: [name], female    DOB: 10-30-1923, 81 y.o.   MRN: [id number]  HPI  COPD - longstanding issue.  Followed at [facility] - Using albuterol nebs about twice a month. Using Albuterol MDI twice a day. - Has run out of spiriva bc in donut whole - no cough no sputm - No incresase in doe, no pnd, no orthopnea. - Able to walk without stopping for breath.  Hypertension - longstanding problem - taking metoprolol succinate daily. - No chest pain - No syncope.  No palpitations  Paroxysmal atrial fibrillation- longstanding rpblem - taking metoprolol and warfarin and amiodarone - No feelings of heart beats  - No bleeding - followed by [place] anticoagulation clinic.  Last INR 1.5  Iron Deficiency Anemia - Longstanding problem - not taking any iron supplements currently - No melena or hematocheiz. 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  - No leg edema, no hematuria  SH: no smoking   Review of Systems See HPI ROS: No headache No falls No unintentional weight loss     Objective:   Physical Exam  VS reviewed GEN: Alert, Cooperative, Groomed, NAD COR: RRR, No M/G/R, No JVD, Normal PMI size and location LUNGS: BCTA, No Acc mm use, speaking in full sentences EXT: No peripheral leg edema. palpable bilateral pedal pulses.  SKIN: No lesion nor rashes of face/trunk/extremities Neuro: Social conversation fluent, normal gait.  Moving all extremities symmetricallyGait: Normal speed, No significant path deviation, Step through +,  Psych: Normal affect/thought/speech/language       Assessment & Plan:  See problem list

## 2015-01-16 NOTE — Assessment & Plan Note (Signed)
Established problem. Stable. [name] is followed at [facility] anticoagulation clinic for dosing her Warfarin. She has had difficulty with INR's below therapeutic range. She would like to be able to stop going to the anticoagulation clinic. I will send message to her cardiologist, Dr Daneen Schick, to see if patient could be considered for a NOAC therapy instead of warfarin therapy.

## 2015-01-16 NOTE — Assessment & Plan Note (Signed)
Established problem Blood pressure is relatively low and has been on last few visits. She is asymptomatic for this low BP  Her metoprolol and amiodarone are only medications that may cause low BP by my knowledge.   Will continue to monitor.  If she develops symptoms related to hypotension, stopping one or both of these cardiovascular medication could be considered.

## 2015-01-19 ENCOUNTER — Other Ambulatory Visit: Payer: Self-pay | Admitting: Family Medicine

## 2015-01-20 ENCOUNTER — Encounter: Payer: Self-pay | Admitting: Family Medicine

## 2015-01-26 ENCOUNTER — Ambulatory Visit (INDEPENDENT_AMBULATORY_CARE_PROVIDER_SITE_OTHER): Payer: Medicare Other

## 2015-01-26 DIAGNOSIS — I480 Paroxysmal atrial fibrillation: Secondary | ICD-10-CM | POA: Diagnosis not present

## 2015-01-26 DIAGNOSIS — Z5181 Encounter for therapeutic drug level monitoring: Secondary | ICD-10-CM

## 2015-01-26 LAB — POCT INR: INR: 2.5

## 2015-02-02 ENCOUNTER — Other Ambulatory Visit: Payer: Self-pay | Admitting: Family Medicine

## 2015-02-12 ENCOUNTER — Ambulatory Visit (INDEPENDENT_AMBULATORY_CARE_PROVIDER_SITE_OTHER): Payer: Medicare Other | Admitting: Family Medicine

## 2015-02-12 ENCOUNTER — Encounter: Payer: Self-pay | Admitting: Family Medicine

## 2015-02-12 ENCOUNTER — Ambulatory Visit
Admission: RE | Admit: 2015-02-12 | Discharge: 2015-02-12 | Disposition: A | Discharge status: Home / Self Care | Payer: Medicare Other | Source: Ambulatory Visit | Attending: Family Medicine | Admitting: Family Medicine

## 2015-02-12 VITALS — BP 126/78 | HR 81 | Wt 156.0 lb

## 2015-02-12 DIAGNOSIS — M25532 Pain in left wrist: Secondary | ICD-10-CM

## 2015-02-12 DIAGNOSIS — I480 Paroxysmal atrial fibrillation: Secondary | ICD-10-CM | POA: Diagnosis not present

## 2015-02-12 DIAGNOSIS — I2510 Atherosclerotic heart disease of native coronary artery without angina pectoris: Secondary | ICD-10-CM | POA: Diagnosis not present

## 2015-02-12 DIAGNOSIS — M19032 Primary osteoarthritis, left wrist: Secondary | ICD-10-CM | POA: Diagnosis not present

## 2015-02-12 MED ORDER — PREDNISONE 10 MG PO TABS: 30.0000 mg | ORAL_TABLET | Freq: Every day | ORAL | Status: DC

## 2015-02-12 NOTE — Patient Instructions (Signed)
I believe that your problems with memory represent mild memory impairment, not at a level of dementia where there is  impairment in abilities to perform tasks like shopping, driving, managing a household, self-administering medications, using a phone, etc..      Treatment with medications like Aricept for mild memory impairment has been shown to not be helpful with memory and it causes patient's to experience stomach upset, diarrhea, loss of appetite, as well as slowing heart rates.   I am not certain what is causing your wrist pain.  It could be from bone, muscle tendon, or joint.   I recommend that you go to [place] for an X-ray of your wrist to check a bone injury. Start a short course of Prednisone 10 mg tablet, 3 tablets by mouth daily for three days incase the pain is coming from gouty arthritis which you have had before in your wrist.  Wear the wrist brace 24 hours a day until the wrist pain has stopped.  Apply Ice to wrist for 15 minutes a time 4 to 5 times a day to relieve wrist pain.  You may take Tylenol or tramadol for pain, if you need it.

## 2015-02-13 ENCOUNTER — Encounter: Payer: Self-pay | Admitting: Family Medicine

## 2015-02-13 ENCOUNTER — Telehealth: Payer: Self-pay | Admitting: Family Medicine

## 2015-02-13 NOTE — Progress Notes (Signed)
   Subjective:    Patient ID: [name], female    DOB: 1923/05/27, 81 y.o.   MRN: [id number]  HPI Wrist Pain: Patient complaints of left wrist pain.  The pain began 3 days ago. The pain is located primarily in the dorsal ulnar area.  She describes the symptoms as aching. Symptoms unimproved with Ibuprofen. The symptoms are worse with flexion, extension and pressure on injured area. The patient  does not have neck pain. The patient is active in lifting cooking pot.. Treatment to date has been nothing.  No recalled trauma.  No new activities with hands.  PMH: Pt has prior history of acute gouty arthritis in wrist several years ago.      Review of Systems No fever No chills No clumsiness.     Objective:   Physical Exam  Vitals reviewed General: groomed, NAD HEENT: no conjunctival injection, no perilimbic injection Cor: RRR, radial pulses 1+ bilaterally Ext: No edema MSK no swelling or erythema of elbows, knees or ankles. Normal Gait.  Wrist:Inspection normal with no visible erythema.   Slight edema left distal ulnar dorsum compared to right. Tender to palpation in edema area.   AROM smooth and normal with good flexion and extension and ulnar/radial deviation that is symmetrical with opposite wrist but painful. Pain with passive ROM as well.  Palpation is normal over metacarpals, navicular, lunate, and TFCC;  Strength 5/5 in all directions without pain.  Lymph: no epitrocheal lymphadenopathy Skin: no lymphangitis,  Psych: Intact orientation, cooperative, happy mood with congruent affect,  able to access remote history, difficulty with recent memory,     I viewed Left Wrist Xray: chondrocalcinosis of TFCC, degenerative changes of CMC joint and scaphoid-Trapezoid/trapezium joints.        Assessment & Plan:

## 2015-02-13 NOTE — Assessment & Plan Note (Signed)
New problem Further workup with Xray Presumed diagnosis is acute pseudogout arthritis based on presence of chondrocalcinosis of TFCC on left. Toher possibilities Trial of Prednisone 30 mg daily for 3 days ICE, immobilization with cock-up wrist splint 24/7 until pain abates. Pt to notify practice if not improved by 12/19.

## 2015-02-13 NOTE — Assessment & Plan Note (Signed)
Established problem Stable Plan: Dr Linard Millers (Cardiology) does not object to transitioning patient to Apixaban from Warfarin  Will discuss on next ov in January the transition.

## 2015-02-13 NOTE — Telephone Encounter (Signed)
I informed Ms Mayor that the wrist xray did not show bone injury.  There may be chondrocalcinosis of triquetral cartiledge that may explain her acute wrist pain as acute pseudogout. Ms Kosakowski had not started her prednisone burst yet.  Her wrist pain continues. I recommended she get prednisone from pharmacy and start it today.  If the pain is not improved by Monday, she should let our office know.

## 2015-02-16 ENCOUNTER — Ambulatory Visit (INDEPENDENT_AMBULATORY_CARE_PROVIDER_SITE_OTHER): Payer: Medicare Other | Admitting: Pharmacist

## 2015-02-16 DIAGNOSIS — Z5181 Encounter for therapeutic drug level monitoring: Secondary | ICD-10-CM

## 2015-02-16 DIAGNOSIS — I480 Paroxysmal atrial fibrillation: Secondary | ICD-10-CM

## 2015-02-16 LAB — POCT INR: INR: 4.6

## 2015-02-24 DIAGNOSIS — R069 Unspecified abnormalities of breathing: Secondary | ICD-10-CM | POA: Diagnosis not present

## 2015-02-25 ENCOUNTER — Inpatient Hospital Stay (HOSPITAL_COMMUNITY)
Admission: EM | Admit: 2015-02-25 | Discharge: 2015-03-06 | DRG: 291 | Disposition: A | Discharge status: Home Health | Payer: Medicare Other | Attending: Family Medicine | Admitting: Family Medicine

## 2015-02-25 ENCOUNTER — Emergency Department (HOSPITAL_COMMUNITY): Payer: Medicare Other

## 2015-02-25 ENCOUNTER — Encounter (HOSPITAL_COMMUNITY): Payer: Self-pay | Admitting: Emergency Medicine

## 2015-02-25 DIAGNOSIS — Z888 Allergy status to other drugs, medicaments and biological substances status: Secondary | ICD-10-CM

## 2015-02-25 DIAGNOSIS — K219 Gastro-esophageal reflux disease without esophagitis: Secondary | ICD-10-CM | POA: Diagnosis present

## 2015-02-25 DIAGNOSIS — F419 Anxiety disorder, unspecified: Secondary | ICD-10-CM | POA: Diagnosis present

## 2015-02-25 DIAGNOSIS — G9340 Encephalopathy, unspecified: Secondary | ICD-10-CM | POA: Diagnosis present

## 2015-02-25 DIAGNOSIS — Z96653 Presence of artificial knee joint, bilateral: Secondary | ICD-10-CM | POA: Diagnosis present

## 2015-02-25 DIAGNOSIS — Z950 Presence of cardiac pacemaker: Secondary | ICD-10-CM | POA: Diagnosis not present

## 2015-02-25 DIAGNOSIS — R7981 Abnormal blood-gas level: Secondary | ICD-10-CM

## 2015-02-25 DIAGNOSIS — M179 Osteoarthritis of knee, unspecified: Secondary | ICD-10-CM | POA: Diagnosis present

## 2015-02-25 DIAGNOSIS — R05 Cough: Secondary | ICD-10-CM | POA: Diagnosis not present

## 2015-02-25 DIAGNOSIS — I5043 Acute on chronic combined systolic (congestive) and diastolic (congestive) heart failure: Secondary | ICD-10-CM | POA: Diagnosis present

## 2015-02-25 DIAGNOSIS — I252 Old myocardial infarction: Secondary | ICD-10-CM

## 2015-02-25 DIAGNOSIS — Z8554 Personal history of malignant neoplasm of ureter: Secondary | ICD-10-CM | POA: Diagnosis not present

## 2015-02-25 DIAGNOSIS — Z9861 Coronary angioplasty status: Secondary | ICD-10-CM | POA: Diagnosis not present

## 2015-02-25 DIAGNOSIS — T380X5A Adverse effect of glucocorticoids and synthetic analogues, initial encounter: Secondary | ICD-10-CM | POA: Diagnosis present

## 2015-02-25 DIAGNOSIS — I509 Heart failure, unspecified: Secondary | ICD-10-CM | POA: Insufficient documentation

## 2015-02-25 DIAGNOSIS — J9601 Acute respiratory failure with hypoxia: Secondary | ICD-10-CM

## 2015-02-25 DIAGNOSIS — J9621 Acute and chronic respiratory failure with hypoxia: Secondary | ICD-10-CM | POA: Diagnosis present

## 2015-02-25 DIAGNOSIS — R739 Hyperglycemia, unspecified: Secondary | ICD-10-CM | POA: Diagnosis present

## 2015-02-25 DIAGNOSIS — N183 Chronic kidney disease, stage 3 (moderate): Secondary | ICD-10-CM | POA: Diagnosis present

## 2015-02-25 DIAGNOSIS — I480 Paroxysmal atrial fibrillation: Secondary | ICD-10-CM | POA: Diagnosis present

## 2015-02-25 DIAGNOSIS — Z7901 Long term (current) use of anticoagulants: Secondary | ICD-10-CM

## 2015-02-25 DIAGNOSIS — K5900 Constipation, unspecified: Secondary | ICD-10-CM | POA: Diagnosis not present

## 2015-02-25 DIAGNOSIS — Z885 Allergy status to narcotic agent status: Secondary | ICD-10-CM

## 2015-02-25 DIAGNOSIS — D6489 Other specified anemias: Secondary | ICD-10-CM | POA: Diagnosis present

## 2015-02-25 DIAGNOSIS — J441 Chronic obstructive pulmonary disease with (acute) exacerbation: Secondary | ICD-10-CM | POA: Diagnosis not present

## 2015-02-25 DIAGNOSIS — J189 Pneumonia, unspecified organism: Secondary | ICD-10-CM | POA: Diagnosis not present

## 2015-02-25 DIAGNOSIS — Z9109 Other allergy status, other than to drugs and biological substances: Secondary | ICD-10-CM | POA: Diagnosis not present

## 2015-02-25 DIAGNOSIS — J9600 Acute respiratory failure, unspecified whether with hypoxia or hypercapnia: Secondary | ICD-10-CM | POA: Diagnosis not present

## 2015-02-25 DIAGNOSIS — Z4682 Encounter for fitting and adjustment of non-vascular catheter: Secondary | ICD-10-CM | POA: Diagnosis not present

## 2015-02-25 DIAGNOSIS — Z87891 Personal history of nicotine dependence: Secondary | ICD-10-CM

## 2015-02-25 DIAGNOSIS — J9690 Respiratory failure, unspecified, unspecified whether with hypoxia or hypercapnia: Secondary | ICD-10-CM

## 2015-02-25 DIAGNOSIS — E7800 Pure hypercholesterolemia, unspecified: Secondary | ICD-10-CM | POA: Diagnosis present

## 2015-02-25 DIAGNOSIS — Z79899 Other long term (current) drug therapy: Secondary | ICD-10-CM | POA: Diagnosis not present

## 2015-02-25 DIAGNOSIS — J9622 Acute and chronic respiratory failure with hypercapnia: Secondary | ICD-10-CM | POA: Diagnosis present

## 2015-02-25 DIAGNOSIS — R0602 Shortness of breath: Secondary | ICD-10-CM | POA: Diagnosis not present

## 2015-02-25 DIAGNOSIS — I129 Hypertensive chronic kidney disease with stage 1 through stage 4 chronic kidney disease, or unspecified chronic kidney disease: Secondary | ICD-10-CM | POA: Diagnosis present

## 2015-02-25 DIAGNOSIS — Z7722 Contact with and (suspected) exposure to environmental tobacco smoke (acute) (chronic): Secondary | ICD-10-CM | POA: Diagnosis present

## 2015-02-25 DIAGNOSIS — Z905 Acquired absence of kidney: Secondary | ICD-10-CM

## 2015-02-25 DIAGNOSIS — I2510 Atherosclerotic heart disease of native coronary artery without angina pectoris: Secondary | ICD-10-CM | POA: Diagnosis present

## 2015-02-25 DIAGNOSIS — R0600 Dyspnea, unspecified: Secondary | ICD-10-CM | POA: Diagnosis not present

## 2015-02-25 HISTORY — DX: Pneumonia, unspecified organism: J18.9

## 2015-02-25 LAB — COMPREHENSIVE METABOLIC PANEL
ALK PHOS: 67 U/L (ref 38–126)
ALT: 26 U/L (ref 14–54)
AST: 31 U/L (ref 15–41)
Albumin: 3.2 g/dL — ABNORMAL LOW (ref 3.5–5.0)
Anion gap: 10 (ref 5–15)
BILIRUBIN TOTAL: 0.4 mg/dL (ref 0.3–1.2)
BUN: 20 mg/dL (ref 6–20)
CALCIUM: 8.5 mg/dL — AB (ref 8.9–10.3)
CHLORIDE: 108 mmol/L (ref 101–111)
CO2: 25 mmol/L (ref 22–32)
CREATININE: 1.35 mg/dL — AB (ref 0.44–1.00)
GFR calc Af Amer: 39 mL/min — ABNORMAL LOW (ref >60)
GFR, EST NON AFRICAN AMERICAN: 34 mL/min — AB (ref >60)
Glucose, Bld: 110 mg/dL — ABNORMAL HIGH (ref 65–99)
Potassium: 3.6 mmol/L (ref 3.5–5.1)
Sodium: 143 mmol/L (ref 135–145)
TOTAL PROTEIN: 5.6 g/dL — AB (ref 6.5–8.1)

## 2015-02-25 LAB — CBC WITH DIFFERENTIAL/PLATELET
BASOS ABS: 0 10*3/uL (ref 0.0–0.1)
Basophils Relative: 0 %
Eosinophils Absolute: 0.1 10*3/uL (ref 0.0–0.7)
Eosinophils Relative: 2 %
HEMATOCRIT: 35.1 % — AB (ref 36.0–46.0)
Hemoglobin: 10.7 g/dL — ABNORMAL LOW (ref 12.0–15.0)
LYMPHS ABS: 1.4 10*3/uL (ref 0.7–4.0)
LYMPHS PCT: 22 %
MCH: 29.6 pg (ref 26.0–34.0)
MCHC: 30.5 g/dL (ref 30.0–36.0)
MCV: 97.2 fL (ref 78.0–100.0)
Monocytes Absolute: 0.8 10*3/uL (ref 0.1–1.0)
Monocytes Relative: 12 %
NEUTROS ABS: 4 10*3/uL (ref 1.7–7.7)
Neutrophils Relative %: 64 %
Platelets: 210 10*3/uL (ref 150–400)
RBC: 3.61 MIL/uL — AB (ref 3.87–5.11)
RDW: 14.6 % (ref 11.5–15.5)
WBC: 6.3 10*3/uL (ref 4.0–10.5)

## 2015-02-25 LAB — BASIC METABOLIC PANEL
Anion gap: 11 (ref 5–15)
BUN: 24 mg/dL — ABNORMAL HIGH (ref 6–20)
CALCIUM: 8.3 mg/dL — AB (ref 8.9–10.3)
CO2: 24 mmol/L (ref 22–32)
CREATININE: 1.76 mg/dL — AB (ref 0.44–1.00)
Chloride: 104 mmol/L (ref 101–111)
GFR calc non Af Amer: 24 mL/min — ABNORMAL LOW (ref >60)
GFR, EST AFRICAN AMERICAN: 28 mL/min — AB (ref >60)
GLUCOSE: 323 mg/dL — AB (ref 65–99)
Potassium: 3.8 mmol/L (ref 3.5–5.1)
Sodium: 139 mmol/L (ref 135–145)

## 2015-02-25 LAB — I-STAT TROPONIN, ED: Troponin i, poc: 0 ng/mL (ref 0.00–0.08)

## 2015-02-25 LAB — MRSA PCR SCREENING: MRSA BY PCR: NEGATIVE

## 2015-02-25 LAB — PROTIME-INR
INR: 2.21 — ABNORMAL HIGH (ref 0.00–1.49)
PROTHROMBIN TIME: 24.3 s — AB (ref 11.6–15.2)

## 2015-02-25 MED ORDER — PANTOPRAZOLE SODIUM 40 MG PO TBEC
40.0000 mg | DELAYED_RELEASE_TABLET | Freq: Every day | ORAL | Status: DC
  Administered 2015-02-25 – 2015-02-26 (×2): 40 mg via ORAL
  Filled 2015-02-25 (×2): qty 1

## 2015-02-25 MED ORDER — CETYLPYRIDINIUM CHLORIDE 0.05 % MT LIQD
7.0000 mL | Freq: Two times a day (BID) | OROMUCOSAL | Status: DC
  Administered 2015-02-26 – 2015-02-28 (×5): 7 mL via OROMUCOSAL

## 2015-02-25 MED ORDER — ALBUTEROL (5 MG/ML) CONTINUOUS INHALATION SOLN
10.0000 mg/h | INHALATION_SOLUTION | Freq: Once | RESPIRATORY_TRACT | Status: AC
  Administered 2015-02-25: 10 mg/h via RESPIRATORY_TRACT

## 2015-02-25 MED ORDER — SODIUM CHLORIDE 0.9 % IV SOLN
INTRAVENOUS | Status: DC
  Administered 2015-02-25 (×2): via INTRAVENOUS

## 2015-02-25 MED ORDER — VITAMIN B-12 1000 MCG PO TABS
1000.0000 ug | ORAL_TABLET | Freq: Every day | ORAL | Status: DC
  Administered 2015-02-25 – 2015-03-06 (×10): 1000 ug via ORAL
  Filled 2015-02-25 (×11): qty 1

## 2015-02-25 MED ORDER — METOPROLOL SUCCINATE ER 25 MG PO TB24
50.0000 mg | ORAL_TABLET | Freq: Every day | ORAL | Status: DC
  Administered 2015-02-25 – 2015-02-26 (×2): 50 mg via ORAL
  Filled 2015-02-25: qty 2
  Filled 2015-02-25 (×2): qty 1

## 2015-02-25 MED ORDER — IPRATROPIUM-ALBUTEROL 0.5-2.5 (3) MG/3ML IN SOLN
3.0000 mL | RESPIRATORY_TRACT | Status: DC | PRN
  Administered 2015-02-26 (×2): 3 mL via RESPIRATORY_TRACT
  Filled 2015-02-25 (×3): qty 3

## 2015-02-25 MED ORDER — ATORVASTATIN CALCIUM 40 MG PO TABS
40.0000 mg | ORAL_TABLET | Freq: Every day | ORAL | Status: DC
  Administered 2015-02-25 – 2015-02-27 (×3): 40 mg via ORAL
  Filled 2015-02-25 (×3): qty 1

## 2015-02-25 MED ORDER — AMIODARONE HCL 200 MG PO TABS
200.0000 mg | ORAL_TABLET | Freq: Every day | ORAL | Status: DC
  Administered 2015-02-25 – 2015-03-06 (×10): 200 mg via ORAL
  Filled 2015-02-25 (×10): qty 1

## 2015-02-25 MED ORDER — WARFARIN - PHARMACIST DOSING INPATIENT
Freq: Every day | Status: DC
  Administered 2015-02-27: 18:00:00

## 2015-02-25 MED ORDER — IPRATROPIUM-ALBUTEROL 0.5-2.5 (3) MG/3ML IN SOLN
3.0000 mL | RESPIRATORY_TRACT | Status: DC
  Administered 2015-02-25 (×4): 3 mL via RESPIRATORY_TRACT
  Filled 2015-02-25 (×4): qty 3

## 2015-02-25 MED ORDER — ALBUTEROL (5 MG/ML) CONTINUOUS INHALATION SOLN
10.0000 mg/h | INHALATION_SOLUTION | Freq: Once | RESPIRATORY_TRACT | Status: AC
  Administered 2015-02-25: 10 mg/h via RESPIRATORY_TRACT
  Filled 2015-02-25: qty 20

## 2015-02-25 MED ORDER — MOMETASONE FURO-FORMOTEROL FUM 100-5 MCG/ACT IN AERO
2.0000 | INHALATION_SPRAY | Freq: Two times a day (BID) | RESPIRATORY_TRACT | Status: DC
  Filled 2015-02-25: qty 8.8

## 2015-02-25 MED ORDER — DEXTROSE 5 % IV SOLN
1.0000 g | INTRAVENOUS | Status: DC
  Administered 2015-02-26 – 2015-02-27 (×2): 1 g via INTRAVENOUS
  Filled 2015-02-25 (×3): qty 10

## 2015-02-25 MED ORDER — WARFARIN SODIUM 3 MG PO TABS
1.5000 mg | ORAL_TABLET | ORAL | Status: DC
  Administered 2015-02-26: 1.5 mg via ORAL
  Filled 2015-02-25: qty 0.5

## 2015-02-25 MED ORDER — DEXTROSE 5 % IV SOLN
1.0000 g | Freq: Once | INTRAVENOUS | Status: AC
  Administered 2015-02-25: 1 g via INTRAVENOUS
  Filled 2015-02-25: qty 10

## 2015-02-25 MED ORDER — TIOTROPIUM BROMIDE MONOHYDRATE 18 MCG IN CAPS
18.0000 ug | ORAL_CAPSULE | Freq: Every day | RESPIRATORY_TRACT | Status: DC
  Filled 2015-02-25: qty 5

## 2015-02-25 MED ORDER — TRAMADOL HCL 50 MG PO TABS: 50.0000 mg | ORAL_TABLET | Freq: Four times a day (QID) | ORAL | Status: DC | PRN

## 2015-02-25 MED ORDER — IPRATROPIUM-ALBUTEROL 0.5-2.5 (3) MG/3ML IN SOLN
3.0000 mL | Freq: Four times a day (QID) | RESPIRATORY_TRACT | Status: DC
  Administered 2015-02-26 (×2): 3 mL via RESPIRATORY_TRACT
  Filled 2015-02-25: qty 3

## 2015-02-25 MED ORDER — VITAMIN D 1000 UNITS PO TABS
1000.0000 [IU] | ORAL_TABLET | Freq: Every day | ORAL | Status: DC
  Administered 2015-02-25 – 2015-03-06 (×10): 1000 [IU] via ORAL
  Filled 2015-02-25 (×10): qty 1

## 2015-02-25 MED ORDER — LORAZEPAM 0.5 MG PO TABS
0.5000 mg | ORAL_TABLET | Freq: Four times a day (QID) | ORAL | Status: DC | PRN
Start: 2015-02-25 — End: 2015-03-01

## 2015-02-25 MED ORDER — WARFARIN SODIUM 2 MG PO TABS
2.0000 mg | ORAL_TABLET | ORAL | Status: DC
  Administered 2015-02-25: 2 mg via ORAL
  Filled 2015-02-25 (×3): qty 1

## 2015-02-25 MED ORDER — AZITHROMYCIN 250 MG PO TABS: 250.0000 mg | ORAL_TABLET | Freq: Every day | ORAL | Status: DC

## 2015-02-25 MED ORDER — AZITHROMYCIN 250 MG PO TABS
250.0000 mg | ORAL_TABLET | Freq: Every day | ORAL | Status: AC
Start: 2015-02-26 — End: 2015-03-01
  Administered 2015-02-26 – 2015-03-01 (×4): 250 mg via ORAL
  Filled 2015-02-25 (×4): qty 1

## 2015-02-25 MED ORDER — DEXTROSE 5 % IV SOLN
500.0000 mg | Freq: Once | INTRAVENOUS | Status: AC
  Administered 2015-02-25: 500 mg via INTRAVENOUS
  Filled 2015-02-25: qty 500

## 2015-02-25 MED ORDER — PREDNISONE 10 MG PO TABS
50.0000 mg | ORAL_TABLET | Freq: Every day | ORAL | Status: DC
  Administered 2015-02-26: 50 mg via ORAL
  Filled 2015-02-25: qty 2

## 2015-02-25 NOTE — Progress Notes (Signed)
UR COMPLETED  

## 2015-02-25 NOTE — ED Notes (Signed)
Respiratory at bedside.

## 2015-02-25 NOTE — Progress Notes (Signed)
RT went to do Continuous Neb. Patient is in Radiology RT will do treatment when patient returns.

## 2015-02-25 NOTE — ED Notes (Addendum)
Phlebotomy at bedside, will collect blood before xray.  Patient being monitored by SpO2 on Room Air before transport.

## 2015-02-25 NOTE — ED Notes (Signed)
Per EMS:  Patient presents to ED after she developed SOB at 8pm this evening.  Pt has a hx of COPD, and states she has been more active over the past few days, which she feels kicked up an exacerbation.  Pt attempted two treatments at home.  EMS noted diminished and wheezing breath sounds.  EMS gave pt Albuterol, Atrovent, and 125 of Solu-medrol en route.  Pt has a hx of continuous pacemaker, and takes lasix for kidney (not CHF).

## 2015-02-25 NOTE — ED Notes (Signed)
Respiratory

## 2015-02-25 NOTE — ED Provider Notes (Signed)
CSN: [id number]     Arrival date & time 02/25/15  0005 History   By signing my name below, I, [name], attest that this documentation has been prepared under the direction and in the presence of Tammy Quale, MD.  Electronically Signed: Forrestine Stewart, ED Scribe. 02/25/2015. 12:25 AM.   Chief Complaint  Patient presents with  . Shortness of Breath   The history is provided by the patient. No language interpreter was used.    HPI Comments: [name] brought in by EMS is a 81 y.o. female with a PMHx of MI, COPD, and HTN who presents to the Emergency Department complaining of constant, ongoing shortness of breath onset 8:00 PM this evening. She also reports ongoing lower extremity swelling x 1 day. Pt admits she has been much more active in the last few days as she has been out and about for the holidays. Shortness of breath is exacerbated with movement. No alleviating factors at this time. Ms. Prior attempted 2 breathing treatments prior to arrival without any improvement. Albuterol, Atrovent, and 125 of Solu-Medrol also given en route to department. She denies any fever, chills, nausea, vomiting, diaphoresis, or chest pain. Pt states symptoms are consistent with her COPD.  PCP: MCDIARMID,TODD D, MD    Past Medical History  Diagnosis Date  . Candidiasis of the esophagus 11/26/2007  . Myocardial infarct, old   . COPD, severe   . Sick sinus syndrome with tachycardia ([place])   . Pacemaker   . Chronic kidney disease (CKD), stage III (moderate)   . Hypertension   . Acute appendicitis with rupture   . Spinal stenosis, lumbar   . Adrenal adenoma     Incidentaloma  . Spondylolisthesis of lumbar region   . Lumbar herniated disc     History of HNP L4/5 in 2003  . Hx of colonoscopy with polypectomy 04/27/2010    Dr Cristina Gong found three  tubular adenomas each less than 10 mm size  . Retinal hemorrhage of left eye 06/2010  . Cataract 2013    Bilateral   . Abnormal mammogram, unspecified  08/23/2010    Followup imaging reassuring.  Repeat in 6 months.   Marland Kitchen DISC WITH RADICULOPATHY 04/27/2006    Qualifier: Diagnosis of  By: McDiarmid MD, Sherren Mocha    . Transitional cell carcinoma of ureter, history   . Mild cognitive impairment 10/26/2012    (10/25/12) Failed MiniCog screen  . 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 Normal uric acid serum level at time of attack.    Marland Kitchen COPD (chronic obstructive pulmonary disease) ([place])   . EDEMA-LEGS,DUE TO VENOUS OBSTRUCT. 04/27/2006    Qualifier: Diagnosis of  By: McDiarmid MD, Sherren Mocha    . ANXIETY 04/27/2006    Qualifier: Diagnosis of  By: McDiarmid MD, Sherren Mocha    . HERNIA, HIATAL, NONCONGENITAL 04/27/2006    Qualifier: Diagnosis of  By: McDiarmid MD, Sherren Mocha    . History of Hemorrhoids 04/27/2006    Qualifier: Diagnosis of  By: McDiarmid MD, Sherren Mocha    . RHINITIS, ALLERGIC 04/27/2006    Qualifier: Diagnosis of  By: McDiarmid MD, Sherren Mocha    . SCHATZKI'S RING, HX OF 11/26/2007    Qualifier: Diagnosis of  By: McDiarmid MD, Sherren Mocha  An EGD was performed by Dr Cristina Gong on 04/27/2010 for iron deficiency anemia. There was a a transient hiatal hernia with Schatzki's ring. Stomach and duodenum were  normal. EGD on 03/06/12 by Dr Cristina Gong for IDA non-obstructing Schatzki's ring at Gastroesophageal junction, otherwise normal esophagus and stomach.    . Urge incontinence 12/13/2011    Diagnosed in 10/2011 by Dr Bjorn Loser (Urology)   . VITAMIN B12 DEFICIENCY 10/07/2009    Qualifier: Diagnosis of  By: McDiarmid MD, Sherren Mocha  Dx based on a post-TKR anemia work-up Low normal serum B12 with high Methylmalonic acid and homocysteine level  Vit B12 serum level (10/28/10) > 1500 pg/mL   . Vitamin D deficiency 11/02/2010    Serum vitamin D 25(OH) = 10.9 ng/mL (30 -100) on 10/28/10 c/w Vitamin D deficiency.     . AF (paroxysmal atrial fibrillation) ([place]) 11/11/2010    Hospitalization (9/8-9/10, Dr Daneen Schick, III,  Cardiology) for Paroxysmal Atrial Fibrillation with RVR and anginal pain secondary to demand/supply mismatch in setting of RVR with known circumflex artery branch disease.    Marland Kitchen HYPERTENSION, BENIGN ESSENTIAL 06/18/2007    Qualifier: Diagnosis of  By: McDiarmid MD, Sherren Mocha    . Iron deficiency anemia 08/05/2010    Dr Cristina Gong (GI) has evaluated with EGD, colonoscopy, and video capsular endoscopy in 2011 & 2012.  All have been unrevealing as to an origin of IDA.  OV with Dr Cristina Gong (10/28/10) assessment of blood in stool per hemoccult and GER. Hbg 12.1 g/dL, MCV 91.8, Ferritin 30 ng/mL. Patient taking on ferrous sulfate tab daily.   EGD on 03/06/12 by Dr Cristina Gong for IDA non-obstructing Schatzki's ring at Gastroesophageal junction, otherwise normal esophagus and stomach.     . Macular degeneration, bilateral 10/04/2010    Right eye is wet MD, the other is dry macular degeneration (ARMD). Pt undergoing some form of vascular endothelial growth factor inhibition intraocular therapy.    . VENTRICULAR HYPERTROPHY, LEFT 08/28/2008    Qualifier: Diagnosis of  By: McDiarmid MD, Sherren Mocha    . CHRONIC KIDNEY DISEASE STAGE III (MODERATE) 09/02/2009    Patient with Solitary Kidney S/P total Nephrectomy for transitional cell caner.    Marland Kitchen COPD 04/27/2006    Qualifier: Diagnosis of  By: McDiarmid MD, Sherren Mocha    . MYOCARDIAL INFARCTION, HX OF 09/25/2008    Qualifier: Diagnosis of  By: McDiarmid MD, Todd  Hospitalized for NSTEMI ( 10/02/2008). PCI (10/02/2008, Dr Daneen Schick) with RCA Roy placed: 99% lesion to 0% lesion. Circumflex Obtuse marginal with 90% stenosis (left for medical mangement for now)   Hospitalization (9/8-9/10, Dr Daneen Schick, III, Cardiology) for Paroxysmal Atrial Fibrillation with RVR and anginal pain secondary to demand/supply mismatch in setting of RVR with known circumflex artery branch disease.    Marland Kitchen SICK SINUS SYNDROME 04/27/2006    Qualifier: Diagnosis of  By: McDiarmid MD, Sherren Mocha  S/P dual chamber pacemaker  placement by Dr Osie Cheeks ([facility]) with pacemaker lead extraction & reinsertion - 07/25/2003  Hospitalization (9/8-9/10, Dr Daneen Schick, III, Cardiology) for Paroxysmal Atrial Fibrillation with RVR and anginal pain secondary to demand/supply mismatch in setting of RVR with known circumflex artery branch disease.    . Solitary kidney, acquired 05/17/2010    Surgical removal for transitional cell cancer by Tresa Endo, MD ([facility]). Surveillance cystoscopy by Dr Alinda Money [facility]) on 10/19/12 without evidence of cystoscopic recurrence. Recommend RTC one year for cystoscopy.   . ADENOMATOUS COLONIC POLYP 03/01/2003    Qualifier: Diagnosis of  By: McDiarmid MD, Sherren Mocha Multiple benign polyps of cecum, ascending, transverse and sigmoid colon by 8/09 colonoscopy by Dr Cristina Gong  Colonoscopy by Dr Cristina Gong for iron-deficiency anemia on  04/27/2010 showed three sessile polyps that were in ascending (3 mm x 9 mm), transverse (4 mm), and cecum (3 mm).  All three were tubular adenomas that were negative for high grade dysplasia or malignancy on pathology. Dr Cristina Gong called the polpys benign and not requiring follow-up in view of the patients age.    . Soft tissue injury of foot 05/03/2011  . PREDIABETES 09/11/2007    Qualifier: Diagnosis of  By: McDiarmid MD, Sherren Mocha    . Numbness and tingling in hands 07/21/2011  . MUSCLE CRAMPS 03/11/2010    Qualifier: Diagnosis of  By: McDiarmid MD, Sherren Mocha    . Mammogram abnormal 02/15/2011  . LUMBAR SPINAL STENOSIS 04/27/2006    Qualifier: Diagnosis of  By: McDiarmid MD, Francisca December HNP w/ L4&5 root encroachment - 05/24/2001,   Spinal Stenosis: L-S MRI: L4-5 Spinal stenosis, - 03/03/2006, Spondylolithesis L5-S1(mild), DJD L4-5 on X-Ray 11/04   . Leg cramps 05/29/2012  . 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 Normal uric acid serum level at time of attack.    . Cellulitis of leg, right 10/01/2012  . Solar  lentigo 06/15/2012  . Muscle spasm of back 09/24/2013  . 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  No mass lesion seen. Likely cause of patients 2 to 3 episodes of bleeding from patient's right eye.    . Shoulder pain, left 01/09/2014  . Seborrheic keratosis, right anterior thigh 12/12/2013  . High risk medications (not anticoagulants) long-term use 03/05/2012   Past Surgical History  Procedure Laterality Date  . Pacemaker insertion      Dr Lovena Le ([facility])  . Nephrectomy  For transition cell cancer     Dr Tresa Endo, surgeon  . Replacement total knee  2009, Right knee    Dr Wynelle Link  . Replacement total knee  05/2009, Left knee    Dr Wynelle Link  . Knee arthroscopy w/ synovectomy  11/2009, left knee    Dr Wynelle Link  . Cholecystectomy    . Esophagogastroduodenoscopy  04/27/2010    Dr Cristina Gong - found transient H/H & Schatzki's ring  . Esophagogastroduodenoscopy endoscopy  03/06/2012    Dr Cristina Gong - found non-obstuctive Schatzki's ring at [employer] [facility]. o/w normal EGD.   Marland Kitchen Pacemaker generator change N/A 11/12/2012    Procedure: PACEMAKER GENERATOR CHANGE;  Surgeon: Evans Lance, MD;  Location: [facility] CATH LAB;  Service: Cardiovascular;  Laterality: N/A;  . Tonsillectomy    . Breast surgery      breast reduction  . Eye surgery      bilateral cataracts  . Trigger finger release Right 05/22/2014    Procedure: RIGHT HAND A-1 PULLEY RELEASE ;  Surgeon: Roseanne Kaufman, MD;  Location: [place];  Service: Orthopedics;  Laterality: Right;  . Dupuytren contracture release Right 05/22/2014    Procedure: DUPUYTREN RELEASE AND REPAIR AS NECESSARY RIGHT RING FINGER AND MIDDLE FINGER;  Surgeon: Roseanne Kaufman, MD;  Location: [place];  Service: Orthopedics;  Laterality: Right;   Family History  Problem  Relation Age of Onset  . Dementia Mother   . Heart disease Father    Social History  Substance Use Topics  . Smoking status: Former Smoker    Types: Cigarettes  . Smokeless  tobacco: Never Used  . Alcohol Use: 12.0 oz/week    14 Glasses of wine, 6 Standard drinks or equivalent per week     Comment: 4-5 glasses of wine per week   OB History    No data available     Review of Systems  Constitutional: Negative for fever and chills.  HENT: Positive for congestion. Negative for postnasal drip, rhinorrhea and sinus pressure.   Respiratory: Positive for cough, shortness of breath and wheezing. Negative for chest tightness.   Cardiovascular: Negative for chest pain and palpitations.  Gastrointestinal: Negative for vomiting, abdominal pain, diarrhea and constipation.  Genitourinary: Negative for dysuria, urgency and hematuria.  Musculoskeletal: Negative for myalgias and back pain.  Skin: Negative for rash.  Neurological: Negative for dizziness, weakness and headaches.  Hematological: Bruises/bleeds easily.    A complete 10 system review of systems was obtained and all systems are negative except as noted in the HPI and PMH.    Allergies  Baclofen; Codeine phosphate; Norco; Hydrochlorothiazide; and Montelukast sodium  Home Medications   Prior to Admission medications   Medication Sig Start Date End Date Taking? Authorizing Provider  ADVAIR DISKUS 250-50 MCG/DOSE AEPB Inhale 2 puffs into the lungs 2 (two) times daily.  03/14/13  Yes Historical Provider, MD  albuterol (PROVENTIL) (2.5 MG/3ML) 0.083% nebulizer solution USE 3 ML VIA NEBULIZER EVERY 6 HOURS AS NEEDED FOR WHEEZING OR SHORTNESS OF BREATH 08/26/14  Yes Blane Ohara McDiarmid, MD  albuterol (VENTOLIN HFA) 108 (90 BASE) MCG/ACT inhaler Inhale 2 puffs into the lungs every 6 (six) hours as needed. If needed for shortness of breath. 02/14/14  Yes Blane Ohara McDiarmid, MD  amiodarone (PACERONE) 200 MG tablet Take 1 tablet by mouth daily.  06/11/14  Yes Historical Provider, MD  atorvastatin (LIPITOR) 40 MG tablet Take 1 tablet (40 mg total) by mouth daily. 08/05/14  Yes Blane Ohara McDiarmid, MD  cholecalciferol (VITAMIN D) 1000 UNITS tablet Take 1,000 Units by mouth daily.   Yes Historical Provider, MD  LORazepam (ATIVAN) 0.5 MG tablet TAKE 1 TABLET BY MOUTH EVERY 8 HOURS AS NEEDED FOR ANXIETY 09/26/14  Yes Blane Ohara McDiarmid, MD  metoprolol succinate (TOPROL-XL) 50 MG 24 hr tablet Take 1 tablet (50 mg total) by mouth daily. with food 06/01/14  Yes Almyra Deforest, PA  nitroGLYCERIN (NITROSTAT) 0.4 MG SL tablet Place 0.4 mg under the tongue every 5 (five) minutes as needed for chest pain.   Yes Historical Provider, MD  omeprazole (PRILOSEC) 20 MG capsule Take 1 capsule (20 mg total) by mouth daily. 11/07/14  Yes Blane Ohara McDiarmid, MD  simvastatin (ZOCOR) 40 MG tablet TAKE 1 TABLET BY MOUTH EVERY MORNING 02/04/15  Yes Blane Ohara McDiarmid, MD  tiotropium (SPIRIVA) 18 MCG inhalation capsule Place 1 capsule (18 mcg total) into inhaler and inhale daily. 01/15/15  Yes Blane Ohara McDiarmid, MD  traMADol (ULTRAM) 50 MG tablet Take 1 tablet (50 mg total) by mouth every 6 (six) hours as needed for moderate pain. 11/17/14  Yes Sela Hua, MD  vitamin B-12 (CYANOCOBALAMIN) 1000 MCG tablet Take 1,000 mcg by mouth daily.    Yes Historical Provider, MD  warfarin (COUMADIN) 1 MG tablet TAKE AS DIRECTED BY ANTICOAGULATION CLINIC Patient taking differently: Take 1.5-2 mg by mouth daily. Take 1.5mg  on Tuesday and Thursdays.  Take 2mg  all other days of the week. 10/29/14  Yes Blane Ohara McDiarmid, MD   Triage Vitals: BP 91/45 mmHg  Pulse 60  Temp(Src) 97.6 F (36.4 C) (Axillary)  Resp 16  Ht 5\' 5"  (1.651 m)  Wt 160 lb 11.5 oz (72.9 kg)  BMI 26.74 kg/m2  SpO2 98%   Physical Exam  Constitutional: She is oriented to person, place, and time. She appears well-developed and well-nourished. No distress.  HENT:  Head: Normocephalic and atraumatic.  Right Ear: External ear normal.   Left Ear: External ear normal.  Nose: Nose normal.  Mouth/Throat: Oropharynx is clear and moist. No oropharyngeal exudate.  Eyes: EOM are normal. Pupils are equal, round, and reactive to light.  Neck: Normal range of motion. Neck supple.  Cardiovascular: Normal rate, regular rhythm, normal heart sounds and intact distal pulses.   No murmur heard. Pulmonary/Chest: Accessory muscle usage present. She is in respiratory distress. She has decreased breath sounds. She has wheezes (diffuse bilateral symmetric). She has no rales.  Abdominal: Soft. She exhibits no distension. There is no tenderness.  Musculoskeletal: Normal range of motion. She exhibits edema (trace, bilateral lower extremity). She exhibits no tenderness.  Neurological: She is alert and oriented to person, place, and time.  Skin: Skin is warm and dry. No rash noted. She is not diaphoretic.  Vitals reviewed.   ED Course  Procedures (including critical care time)  DIAGNOSTIC STUDIES: Oxygen Saturation is 98% on RA, Normal by my interpretation.    COORDINATION OF CARE: 12:20 AM- Will give a breathing treatment. Will order CXR, CBC, CMP, PT-INR, and i-stat troponin. Discussed treatment plan with pt at bedside and pt agreed to plan.     Labs Review Labs Reviewed  CBC WITH DIFFERENTIAL/PLATELET - Abnormal; Notable for the following:    RBC 3.61 (*)    Hemoglobin 10.7 (*)    HCT 35.1 (*)    All other components within normal limits  COMPREHENSIVE METABOLIC PANEL - Abnormal; Notable for the following:    Glucose, Bld 110 (*)    Creatinine, Ser 1.35 (*)    Calcium 8.5 (*)    Total Protein 5.6 (*)    Albumin 3.2 (*)    GFR calc non Af Amer 34 (*)    GFR calc Af Amer 39 (*)    All other components within normal limits  PROTIME-INR - Abnormal; Notable for the following:    Prothrombin Time 24.3 (*)    INR 2.21 (*)    All other components within normal limits  BASIC METABOLIC PANEL - Abnormal; Notable for the following:     Glucose, Bld 323 (*)    BUN 24 (*)    Creatinine, Ser 1.76 (*)    Calcium 8.3 (*)    GFR calc non Af Amer 24 (*)    GFR calc Af Amer 28 (*)    All other components within normal limits  PROTIME-INR - Abnormal; Notable for the following:    Prothrombin Time 31.4 (*)    INR 3.10 (*)    All other components within normal limits  CBC - Abnormal; Notable for the following:    WBC 12.1 (*)    RBC 3.09 (*)    Hemoglobin 9.2 (*)    HCT 30.0 (*)    All other components within normal limits  BRAIN NATRIURETIC PEPTIDE - Abnormal; Notable for the following:    B Natriuretic Peptide 1570.4 (*)    All other components within normal limits  BLOOD GAS, ARTERIAL - Abnormal; Notable for the following:    pH, Arterial 7.086 (*)    pCO2 arterial 86.7 (*)    Bicarbonate 25.0 (*)    Acid-base deficit  4.0 (*)    All other components within normal limits  BASIC METABOLIC PANEL - Abnormal; Notable for the following:    Glucose, Bld 177 (*)    BUN 29 (*)    Creatinine, Ser 1.40 (*)    Calcium 8.7 (*)    GFR calc non Af Amer 32 (*)    GFR calc Af Amer 37 (*)    All other components within normal limits  TROPONIN I - Abnormal; Notable for the following:    Troponin I 0.04 (*)    All other components within normal limits  BASIC METABOLIC PANEL - Abnormal; Notable for the following:    Glucose, Bld 118 (*)    BUN 30 (*)    Creatinine, Ser 1.46 (*)    Calcium 8.6 (*)    GFR calc non Af Amer 31 (*)    GFR calc Af Amer 36 (*)    All other components within normal limits  PROTIME-INR - Abnormal; Notable for the following:    Prothrombin Time 30.4 (*)    INR 2.98 (*)    All other components within normal limits  BASIC METABOLIC PANEL - Abnormal; Notable for the following:    Glucose, Bld 153 (*)    BUN 37 (*)    Creatinine, Ser 1.42 (*)    Calcium 8.3 (*)    GFR calc non Af Amer 32 (*)    GFR calc Af Amer 37 (*)    All other components within normal limits  CBC WITH DIFFERENTIAL/PLATELET -  Abnormal; Notable for the following:    RBC 3.26 (*)    Hemoglobin 9.8 (*)    HCT 31.2 (*)    Neutro Abs 9.3 (*)    Lymphs Abs 0.4 (*)    All other components within normal limits  GLUCOSE, CAPILLARY - Abnormal; Notable for the following:    Glucose-Capillary 129 (*)    All other components within normal limits  GLUCOSE, CAPILLARY - Abnormal; Notable for the following:    Glucose-Capillary 130 (*)    All other components within normal limits  GLUCOSE, CAPILLARY - Abnormal; Notable for the following:    Glucose-Capillary 151 (*)    All other components within normal limits  GLUCOSE, CAPILLARY - Abnormal; Notable for the following:    Glucose-Capillary 120 (*)    All other components within normal limits  POCT I-STAT 3, ART BLOOD GAS (G3+) - Abnormal; Notable for the following:    pH, Arterial 7.331 (*)    pCO2 arterial 50.8 (*)    pO2, Arterial 111.0 (*)    Bicarbonate 26.9 (*)    All other components within normal limits  MRSA PCR SCREENING  GLUCOSE, CAPILLARY  TROPONIN I  TROPONIN I  TRIGLYCERIDES  CBC  I-STAT TROPOININ, ED    Imaging Review Dg Chest 2 View  02/25/2015  CLINICAL DATA:  Acute onset of shortness of breath and cough. Initial encounter. EXAM: CHEST  2 VIEW COMPARISON:  Chest radiograph performed 05/30/2014 FINDINGS: Mild bibasilar opacities raise concern for mild pneumonia. Underlying chronic peribronchial thickening and hyperexpansion are noted, suggestive of COPD. Left midlung nodular scarring is stable in appearance. No definite pleural effusion or pneumothorax is seen. The cardiomediastinal silhouette is borderline normal in size. A pacemaker is noted at the right chest wall, with leads ending at the right atrium and right ventricle. No acute osseous abnormalities are identified. IMPRESSION: Mild bibasilar airspace opacities raise concern for mild pneumonia. Underlying mild left-sided scarring is stable in appearance.  Findings suggest COPD. Electronically Signed    By: Garald Balding M.D.   On: 02/25/2015 01:11   I have personally reviewed and evaluated these images and lab results as part of my medical decision-making.   EKG Interpretation   Date/Time:  Wednesday February 25 2015 00:13:18 EST Ventricular Rate:  66 PR Interval:  186 QRS Duration: 138 QT Interval:  453 QTC Calculation: 475 R Axis:   -48 Text Interpretation:  Sinus rhythm Left bundle branch block New rhythm  since and morphology since previous tracing Confirmed by NGUYEN, EMILY  [id number]) on 02/25/2015 12:20:40 AM      MDM  Patient was seen and evaluated at bedside.  Patient with respiratory distress, increased work of breathing.  Presentation seems most consistent with COPD exacerbation.  Breathing treatments continued.  Solumedrol given prior to presentation.  INR therapeutic.  Chest xray concerning for pneumonia.  Patient started on Rocephin and Azithromycin for CAP.  Case discussed with resident for family medicine who agreed with admission and the patient was admitted under their care for continued treatment and evaluation. Final diagnoses:  COPD exacerbation ([place])  CAP (community acquired pneumonia)    1. CAP  2. COPD exacerbation  I personally performed the services described in this documentation, which was scribed in my presence. The recorded information has been reviewed and is accurate.  Tammy Quale, MD 02/27/15 [phone]

## 2015-02-25 NOTE — H&P (Signed)
[facility] Admission History and Physical Service Pager: [phone]  Patient name: [name] Medical record number: [id number] Date of birth: 1923/07/09 Age: 81 y.o. Gender: female  Primary Care Provider: MCDIARMID,TODD D, MD Consultants: None  Code Status: FULL   Chief Complaint: Dyspnea   Assessment and Plan: [name] is a 81 y.o. female presenting with dyspnea, worsening cough, and increased sputum production. PMH is significant for COPD, AFib, HTN, COPD, CKD Stage III, sick sinus syndrome w/ pacemaker, and CAD s/p PCI.   Dyspnea: Patient presenting with worsening SOB x2 days. Has needed to use albuterol nebs more frequently than usual. Reports worsening cough and increased production of sputum. New O2 requirement in the ED. CXR was read as concerning for PNA. However, CXR appears stable from April 2016. Patient is afebrile and without leukocytosis. PNA is lower on differential, however still a possibility. PE as cause of dyspnea less likely; Well's Score of 0. Symptoms more consistent with COPD exacerbation. Her symptoms are increased dyspnea with sputum production, no recent hospitalizations or antibiotic use for COPD. Per GOLD criteria she is having a moderate exacerbation with risk for poor outcomes given comorbid disease.  -admit to telemetry; vital signs per unit with continuous pulse ox - S/p Azithromycin 500 mg and Rosephin in the ED, continue rosephin 1g qD and azithromycin to cover for COPD and CAP, then transition to oral azithromycin to continue 5 day course -S/p solumedrol en route to the ED, will start prednisone 50 mg daily for 4 more days -continue COPD controller medications as below  -CBC in AM  -Dounebs q4h and q2h prn  -supplemental O2 prn, wean O2 as tolerated  - Anticipate transfer to floor when stable   COPD: Likely acute exacerbation, however no respiratory distress -continue Dulera 2 puffs BID and Spiriva daily  -management of  acute exacerbation as above    HTN: Mildly hypertensive at admission to SBP 161, blood pressure normalized in the ED (-continue to monitor  -continue Metoprolol XL 50 mg daily   H/o AFib: Patient not in Afib on EKG in ED. On long term coumadin for Afib. INR 2.21 at admission.  -continue amiodarone 200 mg daily and metoprolol XL 50 mg daily  -coumadin per pharmacy  -monitor INR   CKD Stage III: Solitary kidney. Patient has h/o transitional cell cancer. Baseline Cr ~1.3. Stable with Cr of 1.35 at admission.  -BMET in AM to monitor Cr    CAD s/p PCI with h/o MI -continue atorvastatin 40 mg daily  -patient with two statins on home med list; will look into this further   Anxiety: Stable.  -continue home Ativan 0.5 mg q6h prn   Osteoarthritis:   -continue Tramadol 50 mg q6h prn   H/o Sick Sinus Syndrome: Pacemaker in place.  FEN/GI: Heart Healthy Diet; Protonix; MIVF @ 100 cc/hr  Prophylaxis: On Full Dose Coumadin   Disposition: Admit to Telemetry; FPTS Attending [place]; Discharge pending improvement of dyspnea and hypoxia   History of Present Illness:  [name] is a 81 y.o. female presenting with dyspnea. Reports increased SOB x 2 days. Has been coughing more than usual with increased sputum. States this feels like her usual COPD exacerbation. Has been using albuterol neb treatments 3-4 times per day with only mild symptom relief. This afternoon, SOB worsened. Patient reports she was gasping and family members called EMS.   In EMS she was given Albuterol, Atrovent, and Solumedrol 125. She required supplemental O2 by  Aersol mask and then was transitioned to [place]. CXR was concerning for PNA; she was given Rocephin and Azithromycin. Also received 2 hour long nebulizer treatments with Albuterol while in the ED.   Patient denies fevers at home. Has had some increased LE edema, which she attributes to being on her feet more from shopping. Swelling improved today. Denies recent illness. Has  had recent secondhand smoke exposure from family members.   Review Of Systems: Per HPI with the following additions: No nausea, vomiting, or diarrhea. No chest pain.   Otherwise the remainder of the systems were negative.  Patient Active Problem List   Diagnosis Date Noted  . CAP (community acquired pneumonia) 02/25/2015  . Wrist pain, left 02/12/2015  . History of iron deficiency 01/16/2015  . Skin ulcer ([place]) 10/24/2014  . Pure hypercholesterolemia 07/31/2014  . Encounter for therapeutic drug monitoring 06/19/2014  . CAD (coronary artery disease), native coronary artery 06/08/2014  . On warfarin therapy 06/01/2014  . Pacemaker 04/02/2013  . Mild cognitive impairment 10/26/2012  . Transitional cell carcinoma of ureter, history   . Chronic anticoagulation 10/01/2012  . Urge incontinence 12/13/2011  . AF (paroxysmal atrial fibrillation) ([place]) 11/11/2010  . Vitamin D deficiency 11/02/2010  . Macular degeneration, bilateral 10/04/2010    Class: Chronic  . Solitary kidney, acquired 05/17/2010  . Spondylolisthesis of lumbar region   . VITAMIN B12 DEFICIENCY 10/07/2009  . CHRONIC KIDNEY DISEASE STAGE III (MODERATE) 09/02/2009  . MYOCARDIAL INFARCTION, HX OF 09/25/2008  . VENTRICULAR HYPERTROPHY, LEFT 08/28/2008  . At risk for diabetes mellitus 09/11/2007  . HYPERTENSION, BENIGN ESSENTIAL 06/18/2007  . ANXIETY 04/27/2006  . SICK SINUS SYNDROME 04/27/2006  . COPD type B ([place]) 04/27/2006  . GASTROESOPHAGEAL REFLUX, NO ESOPHAGITIS 04/27/2006  . OSTEOARTHRITIS, LOWER LEG 04/27/2006  . OSTEOARTHRITIS OF SPINE, NOS 04/27/2006  . LUMBAR SPINAL STENOSIS 04/27/2006    Past Medical History: Past Medical History  Diagnosis Date  . Candidiasis of the esophagus 11/26/2007  . Myocardial infarct, old   . COPD, severe   . Sick sinus syndrome with tachycardia ([place])   . Pacemaker   . Chronic kidney disease (CKD), stage III (moderate)   . Hypertension   . Acute appendicitis with rupture   .  Spinal stenosis, lumbar   . Adrenal adenoma     Incidentaloma  . Spondylolisthesis of lumbar region   . Lumbar herniated disc     History of HNP L4/5 in 2003  . Hx of colonoscopy with polypectomy 04/27/2010    Dr Cristina Gong found three  tubular adenomas each less than 10 mm size  . Retinal hemorrhage of left eye 06/2010  . Cataract 2013    Bilateral   . Abnormal mammogram, unspecified 08/23/2010    Followup imaging reassuring.  Repeat in 6 months.   Marland Kitchen DISC WITH RADICULOPATHY 04/27/2006    Qualifier: Diagnosis of  By: McDiarmid MD, Sherren Mocha    . Transitional cell carcinoma of ureter, history   . Mild cognitive impairment 10/26/2012    (10/25/12) Failed MiniCog screen  . 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 Normal uric acid serum level at time of attack.    Marland Kitchen COPD (chronic obstructive pulmonary disease) ([place])   . EDEMA-LEGS,DUE TO VENOUS OBSTRUCT. 04/27/2006    Qualifier: Diagnosis of  By: McDiarmid MD, Sherren Mocha    . ANXIETY 04/27/2006    Qualifier: Diagnosis of  By: McDiarmid MD, Sherren Mocha    .  HERNIA, HIATAL, NONCONGENITAL 04/27/2006    Qualifier: Diagnosis of  By: McDiarmid MD, Sherren Mocha    . History of Hemorrhoids 04/27/2006    Qualifier: Diagnosis of  By: McDiarmid MD, Sherren Mocha    . RHINITIS, ALLERGIC 04/27/2006    Qualifier: Diagnosis of  By: McDiarmid MD, Sherren Mocha    . SCHATZKI'S RING, HX OF 11/26/2007    Qualifier: Diagnosis of  By: McDiarmid MD, Sherren Mocha  An EGD was performed by Dr Cristina Gong on 04/27/2010 for iron deficiency anemia. There was a a transient hiatal hernia with Schatzki's ring. Stomach and duodenum were normal. EGD on 03/06/12 by Dr Cristina Gong for IDA non-obstructing Schatzki's ring at Gastroesophageal junction, otherwise normal esophagus and stomach.    . Urge incontinence 12/13/2011    Diagnosed in 10/2011 by Dr Bjorn Loser (Urology)   . VITAMIN B12 DEFICIENCY 10/07/2009    Qualifier: Diagnosis of  By: McDiarmid MD, Sherren Mocha  Dx  based on a post-TKR anemia work-up Low normal serum B12 with high Methylmalonic acid and homocysteine level  Vit B12 serum level (10/28/10) > 1500 pg/mL   . Vitamin D deficiency 11/02/2010    Serum vitamin D 25(OH) = 10.9 ng/mL (30 -100) on 10/28/10 c/w Vitamin D deficiency.     . AF (paroxysmal atrial fibrillation) ([place]) 11/11/2010    Hospitalization (9/8-9/10, Dr Daneen Schick, III, Cardiology) for Paroxysmal Atrial Fibrillation with RVR and anginal pain secondary to demand/supply mismatch in setting of RVR with known circumflex artery branch disease.    Marland Kitchen HYPERTENSION, BENIGN ESSENTIAL 06/18/2007    Qualifier: Diagnosis of  By: McDiarmid MD, Sherren Mocha    . Iron deficiency anemia 08/05/2010    Dr Cristina Gong (GI) has evaluated with EGD, colonoscopy, and video capsular endoscopy in 2011 & 2012.  All have been unrevealing as to an origin of IDA.  OV with Dr Cristina Gong (10/28/10) assessment of blood in stool per hemoccult and GER. Hbg 12.1 g/dL, MCV 91.8, Ferritin 30 ng/mL. Patient taking on ferrous sulfate tab daily.   EGD on 03/06/12 by Dr Cristina Gong for IDA non-obstructing Schatzki's ring at Gastroesophageal junction, otherwise normal esophagus and stomach.     . Macular degeneration, bilateral 10/04/2010    Right eye is wet MD, the other is dry macular degeneration (ARMD). Pt undergoing some form of vascular endothelial growth factor inhibition intraocular therapy.    . VENTRICULAR HYPERTROPHY, LEFT 08/28/2008    Qualifier: Diagnosis of  By: McDiarmid MD, Sherren Mocha    . CHRONIC KIDNEY DISEASE STAGE III (MODERATE) 09/02/2009    Patient with Solitary Kidney S/P total Nephrectomy for transitional cell caner.    Marland Kitchen COPD 04/27/2006    Qualifier: Diagnosis of  By: McDiarmid MD, Sherren Mocha    . MYOCARDIAL INFARCTION, HX OF 09/25/2008    Qualifier: Diagnosis of  By: McDiarmid MD, Todd  Hospitalized for NSTEMI ( 10/02/2008). PCI (10/02/2008, Dr Daneen Schick) with RCA [place] placed: 99% lesion to 0% lesion. Circumflex Obtuse marginal with 90%  stenosis (left for medical mangement for now)   Hospitalization (9/8-9/10, Dr Daneen Schick, III, Cardiology) for Paroxysmal Atrial Fibrillation with RVR and anginal pain secondary to demand/supply mismatch in setting of RVR with known circumflex artery branch disease.    Marland Kitchen SICK SINUS SYNDROME 04/27/2006    Qualifier: Diagnosis of  By: McDiarmid MD, Sherren Mocha  S/P dual chamber pacemaker placement by Dr Osie Cheeks ([facility]) with pacemaker lead extraction & reinsertion - 07/25/2003  Hospitalization (9/8-9/10, Dr Daneen Schick, III, Cardiology) for Paroxysmal Atrial Fibrillation with RVR and anginal  pain secondary to demand/supply mismatch in setting of RVR with known circumflex artery branch disease.    . Solitary kidney, acquired 05/17/2010    Surgical removal for transitional cell cancer by Tresa Endo, MD ([facility]). Surveillance cystoscopy by Dr Alinda Money [facility]) on 10/19/12 without evidence of cystoscopic recurrence. Recommend RTC one year for cystoscopy.   . ADENOMATOUS COLONIC POLYP 03/01/2003    Qualifier: Diagnosis of  By: McDiarmid MD, Sherren Mocha Multiple benign polyps of cecum, ascending, transverse and sigmoid colon by 8/09 colonoscopy by Dr Cristina Gong  Colonoscopy by Dr Cristina Gong for iron-deficiency anemia on 04/27/2010 showed three sessile polyps that were in ascending (3 mm x 9 mm), transverse (4 mm), and cecum (3 mm).  All three were tubular adenomas that were negative for high grade dysplasia or malignancy on pathology. Dr Cristina Gong called the polpys benign and not requiring follow-up in view of the patients age.    . Soft tissue injury of foot 05/03/2011  . PREDIABETES 09/11/2007    Qualifier: Diagnosis of  By: McDiarmid MD, Sherren Mocha    . Numbness and tingling in hands 07/21/2011  . MUSCLE CRAMPS 03/11/2010    Qualifier: Diagnosis of  By: McDiarmid MD, Sherren Mocha    . Mammogram abnormal 02/15/2011  . LUMBAR SPINAL STENOSIS 04/27/2006    Qualifier: Diagnosis of  By: McDiarmid MD, Francisca December HNP w/ L4&5 root  encroachment - 05/24/2001,   Spinal Stenosis: L-S MRI: L4-5 Spinal stenosis, - 03/03/2006, Spondylolithesis L5-S1(mild), DJD L4-5 on X-Ray 11/04   . Leg cramps 05/29/2012  . 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 Normal uric acid serum level at time of attack.    . Cellulitis of leg, right 10/01/2012  . Solar lentigo 06/15/2012  . Muscle spasm of back 09/24/2013  . 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  No mass lesion seen. Likely cause of patients 2 to 3 episodes of bleeding from patient's right eye.    . Shoulder pain, left 01/09/2014  . Seborrheic keratosis, right anterior thigh 12/12/2013  . High risk medications (not anticoagulants) long-term use 03/05/2012    Past Surgical History: Past Surgical History  Procedure Laterality Date  . Pacemaker insertion      Dr Lovena Le ([facility])  . Nephrectomy  For transition cell cancer     Dr Tresa Endo, surgeon  . Replacement total knee  2009, Right knee    Dr Wynelle Link  . Replacement total knee  05/2009, Left knee    Dr Wynelle Link  . Knee arthroscopy w/ synovectomy  11/2009, left knee    Dr Wynelle Link  . Cholecystectomy    . Esophagogastroduodenoscopy  04/27/2010    Dr Cristina Gong - found transient H/H & Schatzki's ring  . Esophagogastroduodenoscopy endoscopy  03/06/2012    Dr Cristina Gong - found non-obstuctive Schatzki's ring at [employer] [facility]. o/w normal EGD.   Marland Kitchen Pacemaker generator change N/A 11/12/2012    Procedure: PACEMAKER GENERATOR CHANGE;  Surgeon: Evans Lance, MD;  Location: [facility] CATH LAB;  Service: Cardiovascular;  Laterality: N/A;  . Tonsillectomy    . Breast surgery      breast reduction  . Eye surgery      bilateral cataracts  . Trigger finger release Right  05/22/2014  Procedure: RIGHT HAND A-1 PULLEY RELEASE ;  Surgeon: Roseanne Kaufman, MD;  Location: [facility];  Service: Orthopedics;  Laterality: Right;  . Dupuytren contracture release Right 05/22/2014    Procedure: DUPUYTREN RELEASE AND REPAIR AS NECESSARY RIGHT RING FINGER AND MIDDLE FINGER;  Surgeon: Roseanne Kaufman, MD;  Location: [facility];  Service: Orthopedics;  Laterality: Right;    Social History: Social History  Substance Use Topics  . Smoking status: Former Smoker    Types: Cigarettes  . Smokeless tobacco: Never Used  . Alcohol Use: 12.0 oz/week    14 Glasses of wine, 6 Standard drinks or equivalent per week     Comment: 4-5 glasses of wine per week   Additional social history: None   Please also refer to relevant sections of EMR.  Family History: Family History  Problem Relation Age of Onset  . Dementia Mother   . Heart disease Father     Allergies and Medications: Allergies  Allergen Reactions  . Baclofen Other (See Comments)    Confusion occurred with taking 3 at the same time.  . Codeine Phosphate Nausea Only  . Norco [Hydrocodone-Acetaminophen] Nausea And Vomiting  . Hydrochlorothiazide Other (See Comments)    Possible acute gouty arthritis of wrist  . Montelukast Sodium Other (See Comments)    Unspecified reaction.    No current facility-administered medications on file prior to encounter.   Current Outpatient Prescriptions on File Prior to Encounter  Medication Sig Dispense Refill  . ADVAIR DISKUS 250-50 MCG/DOSE AEPB Inhale 2 puffs into the lungs 2 (two) times daily.     Marland Kitchen albuterol (PROVENTIL) (2.5 MG/3ML) 0.083% nebulizer solution USE 3 ML VIA NEBULIZER EVERY 6 HOURS AS NEEDED FOR WHEEZING OR SHORTNESS OF BREATH 3375 mL 0  . albuterol (VENTOLIN HFA) 108 (90 BASE) MCG/ACT inhaler Inhale 2 puffs into the lungs every 6 (six) hours as needed. If needed for shortness of breath. 1 Inhaler 3  . amiodarone (PACERONE) 200 MG tablet Take 1 tablet by mouth daily.  3  .  atorvastatin (LIPITOR) 40 MG tablet Take 1 tablet (40 mg total) by mouth daily. 90 tablet 3  . cholecalciferol (VITAMIN D) 1000 UNITS tablet Take 1,000 Units by mouth daily.    Marland Kitchen LORazepam (ATIVAN) 0.5 MG tablet TAKE 1 TABLET BY MOUTH EVERY 8 HOURS AS NEEDED FOR ANXIETY 30 tablet 3  . metoprolol succinate (TOPROL-XL) 50 MG 24 hr tablet Take 1 tablet (50 mg total) by mouth daily. with food 90 tablet 3  . nitroGLYCERIN (NITROSTAT) 0.4 MG SL tablet Place 0.4 mg under the tongue every 5 (five) minutes as needed for chest pain.    Marland Kitchen omeprazole (PRILOSEC) 20 MG capsule Take 1 capsule (20 mg total) by mouth daily. 90 capsule 3  . simvastatin (ZOCOR) 40 MG tablet TAKE 1 TABLET BY MOUTH EVERY MORNING 90 tablet 0  . tiotropium (SPIRIVA) 18 MCG inhalation capsule Place 1 capsule (18 mcg total) into inhaler and inhale daily. 30 capsule PRN  . traMADol (ULTRAM) 50 MG tablet Take 1 tablet (50 mg total) by mouth every 6 (six) hours as needed for moderate pain. 30 tablet 1  . vitamin B-12 (CYANOCOBALAMIN) 1000 MCG tablet Take 1,000 mcg by mouth daily.     Marland Kitchen warfarin (COUMADIN) 1 MG tablet TAKE AS DIRECTED BY ANTICOAGULATION CLINIC (Patient taking differently: Take 1.5-2 mg by mouth daily. Take 1.5mg  on Tuesday and Thursdays.  Take 2mg  all other days of the week.) 270 tablet 0    Objective: BP  133/54 mmHg  Pulse 64  Temp(Src) 98.1 F (36.7 C) (Axillary)  Resp 22  SpO2 97% Exam: General: Elderly female lying in bed in NAD  Eyes: EOMI, PERRL ENTM: Oropharynx clear. MMM.  Neck: Full ROM. No lymphadenopathy.  Cardiovascular: RRR. No murmurs appreciated. 2+ pedal pulses.  Respiratory: Diffuse wheezes throughout all lung fields. Normal WOB, [place] in place  Abdomen: +BS, soft, NTND MSK: Moves all extremities spontaneously. 1+ edema to shins bilaterally.  Skin: Warm and dry.  Neuro: No gross deficits. Alert and oriented.  Psych: Pleasant. Appropriate mood and affect.   Labs and Imaging: CBC BMET   Recent  Labs Lab 02/25/15 0050  WBC 6.3  HGB 10.7*  HCT 35.1*  PLT 210    Recent Labs Lab 02/25/15 0050  NA 143  K 3.6  CL 108  CO2 25  BUN 20  CREATININE 1.35*  GLUCOSE 110*  CALCIUM 8.5*     iStat Troponin 0.00  INR 2.21   Dg Chest 2 View  02/25/2015  CLINICAL DATA:  Acute onset of shortness of breath and cough. Initial encounter. EXAM: CHEST  2 VIEW COMPARISON:  Chest radiograph performed 05/30/2014 FINDINGS: Mild bibasilar opacities raise concern for mild pneumonia. Underlying chronic peribronchial thickening and hyperexpansion are noted, suggestive of COPD. Left midlung nodular scarring is stable in appearance. No definite pleural effusion or pneumothorax is seen. The cardiomediastinal silhouette is borderline normal in size. A pacemaker is noted at the right chest wall, with leads ending at the right atrium and right ventricle. No acute osseous abnormalities are identified. IMPRESSION: Mild bibasilar airspace opacities raise concern for mild pneumonia. Underlying mild left-sided scarring is stable in appearance. Findings suggest COPD. Electronically Signed   By: Garald Balding M.D.   On: 02/25/2015 01:11   Dg Wrist Complete Left  02/12/2015  CLINICAL DATA:  Left wrist pain centered at the [facility] joints of the fourth and fifth fingers over the past day with no history of trauma EXAM: LEFT WRIST - COMPLETE 3+ VIEW COMPARISON:  None. FINDINGS: The bones are osteopenic. The distal radius and ulna are intact. There is calcification of the triangular fibrocartilage. There is mild narrowing of the joint between the distal pole of the scaphoid and the trapezium and trapezoid. There is mild narrowing of the first [facility] joint. The second through fifth [facility] joint spaces are preserved. The metacarpals are intact. The soft tissues exhibit no acute abnormalities. IMPRESSION: There is mild degenerative change of the first [facility] joint and of the articulation of the scaphoid with the trapezium and trapezoid. No  significant abnormality of the other [facility] joints is demonstrated. The observed portions of the metacarpals are intact. Electronically Signed   By: David  Martinique M.D.   On: 02/12/2015 15:23    Nicolette Bang, DO 02/25/2015, 3:45 AM PGY-1, [place] Intern pager: [phone], text pages welcome  I have seen and examined the patient. I have read and agree with the above note. My changes are noted in blue.  Kaylan Yates A. Lincoln Brigham MD, [place] [facility] Resident PGY-2 Pager [phone]

## 2015-02-25 NOTE — Progress Notes (Signed)
Data:Patient was admitted to floor with rn on stretcher at [place]. Patient alert and oriented x4 with clear speech. Patient denies c/o pain but endorses sob with exertion . See echarting for additional data.  Action: patient oriented to floor, room, and use of call bell. Notified HO of arrival. Ensured needed items were in close proximity. Bed in lowest position

## 2015-02-25 NOTE — Progress Notes (Signed)
ANTICOAGULATION CONSULT NOTE - Initial Consult  Pharmacy Consult for Coumadin Indication: atrial fibrillation  Allergies  Allergen Reactions  . Baclofen Other (See Comments)    Confusion occurred with taking 3 at the same time.  . Codeine Phosphate Nausea Only  . Norco [Hydrocodone-Acetaminophen] Nausea And Vomiting  . Hydrochlorothiazide Other (See Comments)    Possible acute gouty arthritis of wrist  . Montelukast Sodium Other (See Comments)    Unspecified reaction.     Patient Measurements:    Vital Signs: Temp: 98.1 F (36.7 C) (12/28 0011) Temp Source: Axillary (12/28 0011) BP: 133/54 mmHg (12/28 0030) Pulse Rate: 64 (12/28 0107)  Labs:  Recent Labs  02/25/15 0050  HGB 10.7*  HCT 35.1*  PLT 210  LABPROT 24.3*  INR 2.21*  CREATININE 1.35*    Estimated Creatinine Clearance: 27.2 mL/min (by C-G formula based on Cr of 1.35).   Medical History: Past Medical History  Diagnosis Date  . Candidiasis of the esophagus 11/26/2007  . Myocardial infarct, old   . COPD, severe   . Sick sinus syndrome with tachycardia ([place])   . Pacemaker   . Chronic kidney disease (CKD), stage III (moderate)   . Hypertension   . Acute appendicitis with rupture   . Spinal stenosis, lumbar   . Adrenal adenoma     Incidentaloma  . Spondylolisthesis of lumbar region   . Lumbar herniated disc     History of HNP L4/5 in 2003  . Hx of colonoscopy with polypectomy 04/27/2010    Dr Cristina Gong found three  tubular adenomas each less than 10 mm size  . Retinal hemorrhage of left eye 06/2010  . Cataract 2013    Bilateral   . Abnormal mammogram, unspecified 08/23/2010    Followup imaging reassuring.  Repeat in 6 months.   Marland Kitchen DISC WITH RADICULOPATHY 04/27/2006    Qualifier: Diagnosis of  By: McDiarmid MD, Sherren Mocha    . Transitional cell carcinoma of ureter, history   . Mild cognitive impairment 10/26/2012    (10/25/12) Failed MiniCog screen  . 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 Normal uric acid serum level at time of attack.    Marland Kitchen COPD (chronic obstructive pulmonary disease) ([place])   . EDEMA-LEGS,DUE TO VENOUS OBSTRUCT. 04/27/2006    Qualifier: Diagnosis of  By: McDiarmid MD, Sherren Mocha    . ANXIETY 04/27/2006    Qualifier: Diagnosis of  By: McDiarmid MD, Sherren Mocha    . HERNIA, HIATAL, NONCONGENITAL 04/27/2006    Qualifier: Diagnosis of  By: McDiarmid MD, Sherren Mocha    . History of Hemorrhoids 04/27/2006    Qualifier: Diagnosis of  By: McDiarmid MD, Sherren Mocha    . RHINITIS, ALLERGIC 04/27/2006    Qualifier: Diagnosis of  By: McDiarmid MD, Sherren Mocha    . SCHATZKI'S RING, HX OF 11/26/2007    Qualifier: Diagnosis of  By: McDiarmid MD, Sherren Mocha  An EGD was performed by Dr Cristina Gong on 04/27/2010 for iron deficiency anemia. There was a a transient hiatal hernia with Schatzki's ring. Stomach and duodenum were normal. EGD on 03/06/12 by Dr Cristina Gong for IDA non-obstructing Schatzki's ring at Gastroesophageal junction, otherwise normal esophagus and stomach.    . Urge incontinence 12/13/2011    Diagnosed in 10/2011 by Dr Bjorn Loser (Urology)   . VITAMIN B12 DEFICIENCY 10/07/2009    Qualifier: Diagnosis of  By: McDiarmid MD, Sherren Mocha  Dx based on a post-TKR anemia work-up  Low normal serum B12 with high Methylmalonic acid and homocysteine level  Vit B12 serum level (10/28/10) > 1500 pg/mL   . Vitamin D deficiency 11/02/2010    Serum vitamin D 25(OH) = 10.9 ng/mL (30 -100) on 10/28/10 c/w Vitamin D deficiency.     . AF (paroxysmal atrial fibrillation) ([place]) 11/11/2010    Hospitalization (9/8-9/10, Dr Daneen Schick, III, Cardiology) for Paroxysmal Atrial Fibrillation with RVR and anginal pain secondary to demand/supply mismatch in setting of RVR with known circumflex artery branch disease.    Marland Kitchen HYPERTENSION, BENIGN ESSENTIAL 06/18/2007    Qualifier: Diagnosis of  By: McDiarmid MD, Sherren Mocha    . Iron deficiency anemia 08/05/2010    Dr Cristina Gong (GI) has evaluated with  EGD, colonoscopy, and video capsular endoscopy in 2011 & 2012.  All have been unrevealing as to an origin of IDA.  OV with Dr Cristina Gong (10/28/10) assessment of blood in stool per hemoccult and GER. Hbg 12.1 g/dL, MCV 91.8, Ferritin 30 ng/mL. Patient taking on ferrous sulfate tab daily.   EGD on 03/06/12 by Dr Cristina Gong for IDA non-obstructing Schatzki's ring at Gastroesophageal junction, otherwise normal esophagus and stomach.     . Macular degeneration, bilateral 10/04/2010    Right eye is wet MD, the other is dry macular degeneration (ARMD). Pt undergoing some form of vascular endothelial growth factor inhibition intraocular therapy.    . VENTRICULAR HYPERTROPHY, LEFT 08/28/2008    Qualifier: Diagnosis of  By: McDiarmid MD, Sherren Mocha    . CHRONIC KIDNEY DISEASE STAGE III (MODERATE) 09/02/2009    Patient with Solitary Kidney S/P total Nephrectomy for transitional cell caner.    Marland Kitchen COPD 04/27/2006    Qualifier: Diagnosis of  By: McDiarmid MD, Sherren Mocha    . MYOCARDIAL INFARCTION, HX OF 09/25/2008    Qualifier: Diagnosis of  By: McDiarmid MD, Todd  Hospitalized for NSTEMI ( 10/02/2008). PCI (10/02/2008, Dr Daneen Schick) with RCA [place] placed: 99% lesion to 0% lesion. Circumflex Obtuse marginal with 90% stenosis (left for medical mangement for now)   Hospitalization (9/8-9/10, Dr Daneen Schick, III, Cardiology) for Paroxysmal Atrial Fibrillation with RVR and anginal pain secondary to demand/supply mismatch in setting of RVR with known circumflex artery branch disease.    Marland Kitchen SICK SINUS SYNDROME 04/27/2006    Qualifier: Diagnosis of  By: McDiarmid MD, Sherren Mocha  S/P dual chamber pacemaker placement by Dr Osie Cheeks (EPS-Card) with pacemaker lead extraction & reinsertion - 07/25/2003  Hospitalization (9/8-9/10, Dr Daneen Schick, III, Cardiology) for Paroxysmal Atrial Fibrillation with RVR and anginal pain secondary to demand/supply mismatch in setting of RVR with known circumflex artery branch disease.    . Solitary kidney, acquired  05/17/2010    Surgical removal for transitional cell cancer by Tresa Endo, MD ([facility]). Surveillance cystoscopy by Dr Alinda Money [facility]) on 10/19/12 without evidence of cystoscopic recurrence. Recommend RTC one year for cystoscopy.   . ADENOMATOUS COLONIC POLYP 03/01/2003    Qualifier: Diagnosis of  By: McDiarmid MD, Sherren Mocha Multiple benign polyps of cecum, ascending, transverse and sigmoid colon by 8/09 colonoscopy by Dr Cristina Gong  Colonoscopy by Dr Cristina Gong for iron-deficiency anemia on 04/27/2010 showed three sessile polyps that were in ascending (3 mm x 9 mm), transverse (4 mm), and cecum (3 mm).  All three were tubular adenomas that were negative for high grade dysplasia or malignancy on pathology. Dr Cristina Gong called the polpys benign and not requiring follow-up in view of the patients age.    . Soft tissue injury of foot 05/03/2011  .  PREDIABETES 09/11/2007    Qualifier: Diagnosis of  By: McDiarmid MD, Sherren Mocha    . Numbness and tingling in hands 07/21/2011  . MUSCLE CRAMPS 03/11/2010    Qualifier: Diagnosis of  By: McDiarmid MD, Sherren Mocha    . Mammogram abnormal 02/15/2011  . LUMBAR SPINAL STENOSIS 04/27/2006    Qualifier: Diagnosis of  By: McDiarmid MD, Francisca December HNP w/ L4&5 root encroachment - 05/24/2001,   Spinal Stenosis: L-S MRI: L4-5 Spinal stenosis, - 03/03/2006, Spondylolithesis L5-S1(mild), DJD L4-5 on X-Ray 11/04   . Leg cramps 05/29/2012  . 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 Normal uric acid serum level at time of attack.    . Cellulitis of leg, right 10/01/2012  . Solar lentigo 06/15/2012  . Muscle spasm of back 09/24/2013  . 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  No mass lesion seen. Likely cause of  patients 2 to 3 episodes of bleeding from patient's right eye.    . Shoulder pain, left 01/09/2014  . Seborrheic keratosis, right anterior thigh 12/12/2013  . High risk medications (not anticoagulants) long-term use 03/05/2012    Medications:  See electronic med rec  Assessment: 81 y.o. F presents with dyspnea and cough. Pt on coumadin PTA for afib. Home dose: 2mg  daily except for 1.5mg  on Tues and Thur. Last dose 12/27. INR 2.21 - therapeutic on admission. CBC ok on admission.  Goal of Therapy:  INR 2-3 Monitor platelets by anticoagulation protocol: Yes   Plan:  Daily INR Continue home dose of coumadin: 2mg  daily except for 1.5mg  on Tues and Thur.  Sherlon Handing, PharmD, BCPS Clinical pharmacist, pager [phone] 02/25/2015,5:23 AM

## 2015-02-25 NOTE — ED Notes (Signed)
MD at bedside. 

## 2015-02-25 NOTE — ED Notes (Signed)
Respiratory aware of need for additional hour long nebulization

## 2015-02-26 ENCOUNTER — Inpatient Hospital Stay (HOSPITAL_COMMUNITY): Payer: Medicare Other

## 2015-02-26 DIAGNOSIS — J441 Chronic obstructive pulmonary disease with (acute) exacerbation: Secondary | ICD-10-CM

## 2015-02-26 DIAGNOSIS — J9601 Acute respiratory failure with hypoxia: Secondary | ICD-10-CM | POA: Insufficient documentation

## 2015-02-26 DIAGNOSIS — R0600 Dyspnea, unspecified: Secondary | ICD-10-CM

## 2015-02-26 DIAGNOSIS — I509 Heart failure, unspecified: Secondary | ICD-10-CM | POA: Insufficient documentation

## 2015-02-26 LAB — BLOOD GAS, ARTERIAL
Acid-base deficit: 4 mmol/L — ABNORMAL HIGH (ref 0.0–2.0)
Bicarbonate: 25 mEq/L — ABNORMAL HIGH (ref 20.0–24.0)
Drawn by: 33100
O2 Content: 8 L/min
O2 Saturation: 94 %
PCO2 ART: 86.7 mmHg — AB (ref 35.0–45.0)
PH ART: 7.086 — AB (ref 7.350–7.450)
PO2 ART: 99.5 mmHg (ref 80.0–100.0)
Patient temperature: 98.2
TCO2: 27.7 mmol/L (ref 0–100)

## 2015-02-26 LAB — CBC
HEMATOCRIT: 30 % — AB (ref 36.0–46.0)
HEMOGLOBIN: 9.2 g/dL — AB (ref 12.0–15.0)
MCH: 29.8 pg (ref 26.0–34.0)
MCHC: 30.7 g/dL (ref 30.0–36.0)
MCV: 97.1 fL (ref 78.0–100.0)
Platelets: 211 10*3/uL (ref 150–400)
RBC: 3.09 MIL/uL — ABNORMAL LOW (ref 3.87–5.11)
RDW: 15 % (ref 11.5–15.5)
WBC: 12.1 10*3/uL — AB (ref 4.0–10.5)

## 2015-02-26 LAB — BASIC METABOLIC PANEL
ANION GAP: 10 (ref 5–15)
ANION GAP: 11 (ref 5–15)
BUN: 29 mg/dL — ABNORMAL HIGH (ref 6–20)
BUN: 30 mg/dL — ABNORMAL HIGH (ref 6–20)
CALCIUM: 8.7 mg/dL — AB (ref 8.9–10.3)
CALCIUM: 8.6 mg/dL — AB (ref 8.9–10.3)
CO2: 25 mmol/L (ref 22–32)
CO2: 28 mmol/L (ref 22–32)
CREATININE: 1.4 mg/dL — AB (ref 0.44–1.00)
Chloride: 105 mmol/L (ref 101–111)
Chloride: 103 mmol/L (ref 101–111)
Creatinine, Ser: 1.46 mg/dL — ABNORMAL HIGH (ref 0.44–1.00)
GFR, EST AFRICAN AMERICAN: 37 mL/min — AB (ref >60)
GFR, EST AFRICAN AMERICAN: 36 mL/min — AB (ref >60)
GFR, EST NON AFRICAN AMERICAN: 32 mL/min — AB (ref >60)
GFR, EST NON AFRICAN AMERICAN: 31 mL/min — AB (ref >60)
GLUCOSE: 118 mg/dL — AB (ref 65–99)
Glucose, Bld: 177 mg/dL — ABNORMAL HIGH (ref 65–99)
POTASSIUM: 4.9 mmol/L (ref 3.5–5.1)
Potassium: 5 mmol/L (ref 3.5–5.1)
Sodium: 140 mmol/L (ref 135–145)
Sodium: 142 mmol/L (ref 135–145)

## 2015-02-26 LAB — POCT I-STAT 3, ART BLOOD GAS (G3+)
BICARBONATE: 26.9 meq/L — AB (ref 20.0–24.0)
O2 SAT: 98 %
TCO2: 28 mmol/L (ref 0–100)
pCO2 arterial: 50.8 mmHg — ABNORMAL HIGH (ref 35.0–45.0)
pH, Arterial: 7.331 — ABNORMAL LOW (ref 7.350–7.450)
pO2, Arterial: 111 mmHg — ABNORMAL HIGH (ref 80.0–100.0)

## 2015-02-26 LAB — GLUCOSE, CAPILLARY
Glucose-Capillary: 94 mg/dL (ref 65–99)
Glucose-Capillary: 129 mg/dL — ABNORMAL HIGH (ref 65–99)
Glucose-Capillary: 130 mg/dL — ABNORMAL HIGH (ref 65–99)

## 2015-02-26 LAB — TROPONIN I
TROPONIN I: 0.04 ng/mL — AB (ref <0.031)
Troponin I: <0.03 ng/mL (ref <0.031)

## 2015-02-26 LAB — BRAIN NATRIURETIC PEPTIDE: B NATRIURETIC PEPTIDE 5: 1570.4 pg/mL — AB (ref 0.0–100.0)

## 2015-02-26 LAB — PROTIME-INR
INR: 3.1 — AB (ref 0.00–1.49)
PROTHROMBIN TIME: 31.4 s — AB (ref 11.6–15.2)

## 2015-02-26 LAB — TRIGLYCERIDES: TRIGLYCERIDES: 79 mg/dL (ref <150)

## 2015-02-26 MED ORDER — ARFORMOTEROL TARTRATE 15 MCG/2ML IN NEBU: 15.0000 ug | INHALATION_SOLUTION | Freq: Two times a day (BID) | RESPIRATORY_TRACT | Status: DC

## 2015-02-26 MED ORDER — MIDAZOLAM HCL 2 MG/2ML IJ SOLN
INTRAMUSCULAR | Status: AC
  Filled 2015-02-26: qty 4

## 2015-02-26 MED ORDER — BUDESONIDE 0.5 MG/2ML IN SUSP
0.5000 mg | Freq: Two times a day (BID) | RESPIRATORY_TRACT | Status: DC
  Administered 2015-02-26 – 2015-03-06 (×15): 0.5 mg via RESPIRATORY_TRACT
  Filled 2015-02-26 (×15): qty 2

## 2015-02-26 MED ORDER — VITAL HIGH PROTEIN PO LIQD
1000.0000 mL | ORAL | Status: DC
  Administered 2015-02-26: 1000 mL
  Administered 2015-02-26 – 2015-02-27 (×2)
  Administered 2015-02-27: 1000 mL
  Filled 2015-02-26 (×3): qty 1000

## 2015-02-26 MED ORDER — ANTISEPTIC ORAL RINSE SOLUTION (CORINZ)
7.0000 mL | Freq: Four times a day (QID) | OROMUCOSAL | Status: DC
  Administered 2015-02-26 – 2015-02-28 (×7): 7 mL via OROMUCOSAL

## 2015-02-26 MED ORDER — INSULIN ASPART 100 UNIT/ML ~~LOC~~ SOLN
0.0000 [IU] | Freq: Three times a day (TID) | SUBCUTANEOUS | Status: DC
  Administered 2015-02-27: 3 [IU] via SUBCUTANEOUS

## 2015-02-26 MED ORDER — BISACODYL 10 MG RE SUPP: 10.0000 mg | Freq: Every day | RECTAL | Status: DC | PRN

## 2015-02-26 MED ORDER — FENTANYL CITRATE (PF) 100 MCG/2ML IJ SOLN
INTRAMUSCULAR | Status: AC
  Filled 2015-02-26: qty 4

## 2015-02-26 MED ORDER — FENTANYL CITRATE (PF) 100 MCG/2ML IJ SOLN
50.0000 ug | INTRAMUSCULAR | Status: DC | PRN
  Administered 2015-02-27 (×3): 50 ug via INTRAVENOUS
  Filled 2015-02-26 (×4): qty 2

## 2015-02-26 MED ORDER — ASPIRIN 81 MG PO CHEW
81.0000 mg | CHEWABLE_TABLET | Freq: Every day | ORAL | Status: DC
  Administered 2015-02-26 – 2015-03-06 (×9): 81 mg via ORAL
  Filled 2015-02-26 (×9): qty 1

## 2015-02-26 MED ORDER — METHYLPREDNISOLONE SODIUM SUCC 125 MG IJ SOLR
80.0000 mg | Freq: Two times a day (BID) | INTRAMUSCULAR | Status: DC
  Administered 2015-02-26 – 2015-02-27 (×2): 80 mg via INTRAVENOUS
  Filled 2015-02-26 (×2): qty 2

## 2015-02-26 MED ORDER — MIDAZOLAM HCL 2 MG/2ML IJ SOLN
2.0000 mg | Freq: Once | INTRAMUSCULAR | Status: AC
  Administered 2015-02-26: 2 mg via INTRAVENOUS

## 2015-02-26 MED ORDER — PROPOFOL 1000 MG/100ML IV EMUL
0.0000 ug/kg/min | INTRAVENOUS | Status: DC
  Administered 2015-02-26 (×2): 40 ug/kg/min via INTRAVENOUS
  Administered 2015-02-26: 5 ug/kg/min via INTRAVENOUS
  Administered 2015-02-27 – 2015-02-28 (×5): 40 ug/kg/min via INTRAVENOUS
  Filled 2015-02-26 (×9): qty 100

## 2015-02-26 MED ORDER — DOCUSATE SODIUM 50 MG/5ML PO LIQD: 100.0000 mg | Freq: Two times a day (BID) | ORAL | Status: DC | PRN

## 2015-02-26 MED ORDER — IPRATROPIUM-ALBUTEROL 0.5-2.5 (3) MG/3ML IN SOLN
3.0000 mL | Freq: Four times a day (QID) | RESPIRATORY_TRACT | Status: DC
  Administered 2015-02-26 – 2015-02-28 (×9): 3 mL via RESPIRATORY_TRACT
  Filled 2015-02-26 (×9): qty 3

## 2015-02-26 MED ORDER — INSULIN ASPART 100 UNIT/ML ~~LOC~~ SOLN: 0.0000 [IU] | Freq: Every day | SUBCUTANEOUS | Status: DC

## 2015-02-26 MED ORDER — FUROSEMIDE 10 MG/ML IJ SOLN
20.0000 mg | Freq: Once | INTRAMUSCULAR | Status: AC
  Administered 2015-02-26: 20 mg via INTRAVENOUS
  Filled 2015-02-26: qty 2

## 2015-02-26 MED ORDER — ARFORMOTEROL TARTRATE 15 MCG/2ML IN NEBU
15.0000 ug | INHALATION_SOLUTION | Freq: Two times a day (BID) | RESPIRATORY_TRACT | Status: DC
  Administered 2015-02-26 – 2015-03-06 (×14): 15 ug via RESPIRATORY_TRACT
  Filled 2015-02-26 (×14): qty 2

## 2015-02-26 MED ORDER — ROCURONIUM BROMIDE 50 MG/5ML IV SOLN
80.0000 mg | Freq: Once | INTRAVENOUS | Status: AC
  Administered 2015-02-26: 80 mg via INTRAVENOUS
  Filled 2015-02-26: qty 8

## 2015-02-26 MED ORDER — CHLORHEXIDINE GLUCONATE 0.12% ORAL RINSE (MEDLINE KIT)
15.0000 mL | Freq: Two times a day (BID) | OROMUCOSAL | Status: DC
  Administered 2015-02-26 – 2015-02-28 (×5): 15 mL via OROMUCOSAL

## 2015-02-26 MED ORDER — DOCUSATE SODIUM 100 MG PO CAPS: 100.0000 mg | ORAL_CAPSULE | Freq: Two times a day (BID) | ORAL | Status: DC | PRN

## 2015-02-26 MED ORDER — FENTANYL CITRATE (PF) 100 MCG/2ML IJ SOLN
50.0000 ug | INTRAMUSCULAR | Status: DC | PRN
  Administered 2015-02-27 (×2): 50 ug via INTRAVENOUS
  Filled 2015-02-26: qty 2

## 2015-02-26 MED ORDER — BUDESONIDE 0.5 MG/2ML IN SUSP: 0.5000 mg | Freq: Two times a day (BID) | RESPIRATORY_TRACT | Status: DC

## 2015-02-26 MED ORDER — ETOMIDATE 2 MG/ML IV SOLN
20.0000 mg | Freq: Once | INTRAVENOUS | Status: AC
  Administered 2015-02-26: 20 mg via INTRAVENOUS

## 2015-02-26 MED ORDER — FUROSEMIDE 10 MG/ML IJ SOLN
40.0000 mg | Freq: Four times a day (QID) | INTRAMUSCULAR | Status: AC
  Administered 2015-02-26 (×2): 40 mg via INTRAVENOUS
  Filled 2015-02-26 (×2): qty 4

## 2015-02-26 MED ORDER — FENTANYL CITRATE (PF) 100 MCG/2ML IJ SOLN
100.0000 ug | Freq: Once | INTRAMUSCULAR | Status: AC
Start: 2015-02-26 — End: 2015-02-26
  Administered 2015-02-26: 100 ug via INTRAVENOUS

## 2015-02-26 NOTE — Significant Event (Signed)
Rapid Response Event Note  Overview: Time Called: [id number] Arrival Time: 0134 Event Type: Respiratory  Initial Focused Assessment: called by Have, RN  - primary RN due to pt's acute resp distress.  On arrival pt was cyanotic with nearly absent breath sounds and O2 sats at 45% on NRB.  Other vitals were stable.  Pt had markedly increased WOB and diminished LOC but was aroussable with loud verbal stimuli.     Interventions: On arrival, pt was on a NRB.  A nebulizer was given, STAT CXR and ABG were obtained, additional IV access was established with STAT labs drawn and sent.  The pt was placed on Bi-pap, teaching service came to bedside and CCM was consulted for ICU transfer.   Event Summary:  The pt stabilized somewhat on Bi-pap and was promptly transferred to 2S with no adverse events.  Her ABG was noted to be abnormal with a pH of 7.0 and CO2 of 86.  She was intubated by Dr Lake Bells on arrival.  Her daughter was updated at the bedside by RR and Dr. Lake Bells.  Bedside handoff was given by Have, RN 2W and RR RN to 2S staff.   Name of Physician Notified: teaching service at 1141  Name of Consulting Physician Notified: Dr Lake Bells at 1204  Outcome: Transferred (Comment) (2S)  Event End Time: [place], Marcello Moores

## 2015-02-26 NOTE — Progress Notes (Signed)
Report given to [facility]. All belong sent with patient. Daughter is aware of disposition at this time.   Ave Filter, RN

## 2015-02-26 NOTE — Progress Notes (Signed)
Pt attempted to get out of bed , hit right leg on bedside table and caused a skin tear. Pink foam drsg applied. Will continue to monitor.   Mairin Lindsley M

## 2015-02-26 NOTE — Clinical Documentation Improvement (Signed)
Family Medicine  Possible Conditions?         Respiratory Failure  Document Acuity - Acute, Chronic, Acute on Chronic  Document Inclusion Of - Hypoxia, Hypercapnia, Combination of Both  Other  Clinically Undetermined  Document any associated diagnoses/conditions. Please update your documentation within the medical record to reflect your response to this query. Thank you.  Supporting Information:(as per notes) "SOB worsened. Patient reports she was gasping and family members called EMS. In EMS she was given Albuterol, Atrovent, and Solumedrol 125. She required supplemental O2 by Aersol mask and then was transitioned to [place]. CXR was concerning for PNA; she was given Rocephin and Azithromycin. Also received 2 hour long nebulizer treatments with Albuterol while in the ED."  Please exercise your independent, professional judgment when responding. A specific answer is not anticipated or expected.  Thank You, Alessandra Grout, RN, BSN, CCDS,Clinical Documentation Specialist:  [phone]  [phone]=[facility]- Health Information Management

## 2015-02-26 NOTE — Progress Notes (Signed)
Inpatient Diabetes Program Recommendations  AACE/ADA: New Consensus Statement on Inpatient Glycemic Control (2015)  Target Ranges:  Prepandial:   less than 140 mg/dL      Peak postprandial:   less than 180 mg/dL (1-2 hours)      Critically ill patients:  140 - 180 mg/dL   Review of Glycemic Control  Results for NAYELA, STOICA (MRN [id number]) as of 02/26/2015 11:02  Ref. Range 02/25/2015 00:50 02/25/2015 08:00  Glucose Latest Ref Range: 65-99 mg/dL 110 (H) 323 (H)    Inpatient Diabetes Program Recommendations:  Correction (SSI): Novolog sensitive tidwc while on Prednisone   Will follow. Thank you. Lorenda Peck, RD, LDN, CDE Inpatient Diabetes Coordinator [phone]

## 2015-02-26 NOTE — Progress Notes (Signed)
[place] for Coumadin Indication: atrial fibrillation  Allergies  Allergen Reactions  . Baclofen Other (See Comments)    Confusion occurred with taking 3 at the same time.  . Codeine Phosphate Nausea Only  . Norco [Hydrocodone-Acetaminophen] Nausea And Vomiting  . Hydrochlorothiazide Other (See Comments)    Possible acute gouty arthritis of wrist  . Montelukast Sodium Other (See Comments)    Unspecified reaction.     Patient Measurements: Height: 5\' 5"  (165.1 cm) Weight: 171 lb 11.8 oz (77.9 kg) IBW/kg (Calculated) : 57  Vital Signs: Temp: 97.6 F (36.4 C) (12/29 0945) Temp Source: Oral (12/29 0945) BP: 156/119 mmHg (12/29 0945) Pulse Rate: 63 (12/29 0945)  Labs:  Recent Labs  02/25/15 0050 02/25/15 0800 02/26/15 0315  HGB 10.7*  --  9.2*  HCT 35.1*  --  30.0*  PLT 210  --  211  LABPROT 24.3*  --  31.4*  INR 2.21*  --  3.10*  CREATININE 1.35* 1.76*  --     Estimated Creatinine Clearance: 22.4 mL/min (by C-G formula based on Cr of 1.76).  Assessment: 81 y.o. F presents with dyspnea and cough. Pt on coumadin PTA for afib. Home dose: 2mg  daily except for 1.5mg  on Tues and Thur. INR 2.21 - therapeutic on admission. CBC ok on admission. 12/28 INR 3.1 - due to get lower dose of 1.5 mg today per home regimen CBC stable, has new skin tear from hitting leg on bedside table.   Goal of Therapy:  INR 2-3 Monitor platelets by anticoagulation protocol: Yes   Plan:  Daily INR Continue home dose of coumadin: 2mg  daily except for 1.5mg  on Tues and Thur.  Eudelia Bunch, Pharm.D. [id number] 02/26/2015 11:44 AM

## 2015-02-26 NOTE — Procedures (Signed)
Intubation Procedure Note [name] [id number] 1923-10-19  Procedure: Intubation Indications: Respiratory insufficiency  Procedure Details Consent: Risks of procedure as well as the alternatives and risks of each were explained to the (patient/caregiver).  Consent for procedure obtained. Time Out: Verified patient identification, verified procedure, site/side was marked, verified correct patient position, special equipment/implants available, medications/allergies/relevent history reviewed, required imaging and test results available.  Performed  Drugs Etomidate 20mg  IV, Rocuronium 80mg  IV, Fentanyl 132mcg IV, Versed 2mg  IV DL x 1 with GS3 blade Grade 1 view 7.5 ET tube passed through cords under direct visualization Placement confirmed with bilateral breath sounds, positive EtCO2 change and smoke in tube   Evaluation Hemodynamic Status: BP stable throughout; O2 sats: stable throughout Patient's Current Condition: stable Complications: No apparent complications Patient did tolerate procedure well. Chest X-ray ordered to verify placement.  CXR: pending.   Simonne Maffucci 02/26/2015

## 2015-02-26 NOTE — Consult Note (Signed)
PULMONARY / CRITICAL CARE MEDICINE   Name: [name] MRN: [id number] DOB: [birth year]    ADMISSION DATE:  02/25/2015 CONSULTATION DATE:  02/26/2015  REFERRING MD:  Andria Frames  CHIEF COMPLAINT:  Dyspnea  HISTORY OF PRESENT ILLNESS:   81 y/o female admitted on 12/28 with a chief complaint of dyspnea.  She was admitted to the [facility] with a presumed diagnosis of COPD exacerbation.  She was also treated for pneumonia with IVF and IV antibiotics.  She apparently initially improved but after 24 hours her dyspnea worsened and PCCM was consulted this morning for an abrupt change in her dyspnea and the development of acute hypoxemic respiratory failure.  No further history could be obtained from the patient due to severe dyspnea.   PAST MEDICAL HISTORY :  She  has a past medical history of Candidiasis of the esophagus (11/26/2007); Myocardial infarct, old; COPD, severe; Sick sinus syndrome with tachycardia ([place]); Pacemaker; Chronic kidney disease (CKD), stage III (moderate); Hypertension; Acute appendicitis with rupture; Spinal stenosis, lumbar; Adrenal adenoma; Spondylolisthesis of lumbar region; Lumbar herniated disc; colonoscopy with polypectomy (04/27/2010); Retinal hemorrhage of left eye (06/2010); Cataract (2013); Abnormal mammogram, unspecified (08/23/2010); DISC WITH RADICULOPATHY (04/27/2006); Transitional cell carcinoma of ureter, history; Mild cognitive impairment (10/26/2012); 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 (04/27/2006); ANXIETY (04/27/2006); HERNIA, HIATAL, NONCONGENITAL (04/27/2006); History of Hemorrhoids (04/27/2006); RHINITIS, ALLERGIC (04/27/2006); SCHATZKI'S RING, HX OF (11/26/2007); Urge incontinence (12/13/2011); VITAMIN B12 DEFICIENCY (10/07/2009); Vitamin D deficiency (11/02/2010); AF (paroxysmal atrial fibrillation) ([place]) (11/11/2010); HYPERTENSION, BENIGN ESSENTIAL (06/18/2007); Iron deficiency anemia (08/05/2010);  Macular degeneration, bilateral (10/04/2010); VENTRICULAR HYPERTROPHY, LEFT (08/28/2008); CHRONIC KIDNEY DISEASE STAGE III (MODERATE) (09/02/2009); COPD (04/27/2006); MYOCARDIAL INFARCTION, HX OF (09/25/2008); SICK SINUS SYNDROME (04/27/2006); Solitary kidney, acquired (05/17/2010); ADENOMATOUS COLONIC POLYP (03/01/2003); Soft tissue injury of foot (05/03/2011); PREDIABETES (09/11/2007); Numbness and tingling in hands (07/21/2011); MUSCLE CRAMPS (03/11/2010); Mammogram abnormal (02/15/2011); LUMBAR SPINAL STENOSIS (04/27/2006); Leg cramps (05/29/2012); 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  PAST SURGICAL HISTORY: She  has past surgical history that includes Pacemaker insertion; Nephrectomy (For transition cell cancer ); Replacement total knee (2009, Right knee); Replacement total knee (05/2009, Left knee); Knee arthroscopy w/ synovectomy (11/2009, left knee); Cholecystectomy; Esophagogastroduodenoscopy (04/27/2010); Esophagogastroduodenoscopy endoscopy (03/06/2012); pacemaker generator change (N/A, 11/12/2012); Tonsillectomy; Breast surgery; Eye surgery; Trigger finger release (Right, 05/22/2014); and Dupuytren contracture release (Right, 05/22/2014).  Allergies  Allergen Reactions  . Baclofen Other (See Comments)    Confusion occurred with taking 3 at the same time.  . Codeine Phosphate Nausea Only  . Norco [Hydrocodone-Acetaminophen] Nausea And Vomiting  . Hydrochlorothiazide Other (See Comments)    Possible acute gouty arthritis of wrist  . Montelukast Sodium Other (See Comments)    Unspecified reaction.     No current facility-administered medications on file prior to encounter.   Current Outpatient  Prescriptions on File Prior to Encounter  Medication Sig  . ADVAIR DISKUS 250-50 MCG/DOSE AEPB Inhale 2 puffs into the lungs 2 (two) times daily.   Marland Kitchen albuterol (PROVENTIL) (2.5 MG/3ML) 0.083% nebulizer solution USE 3 ML VIA NEBULIZER EVERY 6 HOURS AS NEEDED FOR WHEEZING OR SHORTNESS OF BREATH  . albuterol (VENTOLIN HFA) 108 (90 BASE) MCG/ACT inhaler Inhale 2 puffs into the lungs every  6 (six) hours as needed. If needed for shortness of breath.  Marland Kitchen amiodarone (PACERONE) 200 MG tablet Take 1 tablet by mouth daily.  Marland Kitchen atorvastatin (LIPITOR) 40 MG tablet Take 1 tablet (40 mg total) by mouth daily.  . cholecalciferol (VITAMIN D) 1000 UNITS tablet Take 1,000 Units by mouth daily.  Marland Kitchen LORazepam (ATIVAN) 0.5 MG tablet TAKE 1 TABLET BY MOUTH EVERY 8 HOURS AS NEEDED FOR ANXIETY  . metoprolol succinate (TOPROL-XL) 50 MG 24 hr tablet Take 1 tablet (50 mg total) by mouth daily. with food  . nitroGLYCERIN (NITROSTAT) 0.4 MG SL tablet Place 0.4 mg under the tongue every 5 (five) minutes as needed for chest pain.  Marland Kitchen omeprazole (PRILOSEC) 20 MG capsule Take 1 capsule (20 mg total) by mouth daily.  . simvastatin (ZOCOR) 40 MG tablet TAKE 1 TABLET BY MOUTH EVERY MORNING  . tiotropium (SPIRIVA) 18 MCG inhalation capsule Place 1 capsule (18 mcg total) into inhaler and inhale daily.  . traMADol (ULTRAM) 50 MG tablet Take 1 tablet (50 mg total) by mouth every 6 (six) hours as needed for moderate pain.  . vitamin B-12 (CYANOCOBALAMIN) 1000 MCG tablet Take 1,000 mcg by mouth daily.   Marland Kitchen warfarin (COUMADIN) 1 MG tablet TAKE AS DIRECTED BY ANTICOAGULATION CLINIC (Patient taking differently: Take 1.5-2 mg by mouth daily. Take 1.5mg  on Tuesday and Thursdays.  Take 2mg  all other days of the week.)    FAMILY HISTORY: SOCIAL HISTORY:REVIEW OF SYSTEMS:  Could not obtain due to severe dyspnea  SUBJECTIVE:  As above  VITAL SIGNS: BP 192/81 mmHg  Pulse 59  Temp(Src) 97.6 F (36.4 C) (Oral)  Resp 33  Ht 5\' 5"  (1.651 m)  Wt  77.9 kg (171 lb 11.8 oz)  BMI 28.58 kg/m2  SpO2 97%  HEMODYNAMICS:    VENTILATOR SETTINGS:    INTAKE / OUTPUT: I/O last 3 completed shifts: In: 2981.7 [P.O.:1440; I.V.:1291.7; IV Piggyback:250] Out: 650 [Urine:650]  PHYSICAL EXAMINATION: General:  Severe dyspnea Neuro:  Awake, anxious, moves all four ext HEENT:  NCAT, EOMi, OP clear Cardiovascular:  Tachy, irregularly irregular, no mgr Lungs:  Wheezing bilaterally, increased work of breathing, paradoxical movement on BIPAP, accessory muscle use Abdomen:  BS+, soft, nontender Musculoskeletal:  Normal bulk and tone, no bony deformity Skin:  Normal turgor, no breakdown  LABS:  BMET  Recent Labs Lab 02/25/15 0050 02/25/15 0800  NA 143 139  K 3.6 3.8  CL 108 104  CO2 25 24  BUN 20 24*  CREATININE 1.35* 1.76*  GLUCOSE 110* 323*    Electrolytes  Recent Labs Lab 02/25/15 0050 02/25/15 0800  CALCIUM 8.5* 8.3*    CBC  Recent Labs Lab 02/25/15 0050 02/26/15 0315  WBC 6.3 12.1*  HGB 10.7* 9.2*  HCT 35.1* 30.0*  PLT 210 211    Coag's  Recent Labs Lab 02/25/15 0050 02/26/15 0315  INR 2.21* 3.10*    Sepsis Markers No results for input(s): LATICACIDVEN, PROCALCITON, O2SATVEN in the last 168 hours.  ABG  Recent Labs Lab 02/26/15 1155  PHART 7.086*  PCO2ART 86.7*  PO2ART 99.5    Liver Enzymes  Recent Labs Lab 02/25/15 0050  AST 31  ALT 26  ALKPHOS 67  BILITOT 0.4  ALBUMIN 3.2*    Cardiac Enzymes No results for input(s): TROPONINI, PROBNP in the last 168 hours.  Glucose  Recent Labs Lab 02/26/15 1113  GLUCAP 94    Imaging Dg Chest Port 1 View  02/26/2015  CLINICAL DATA:  Shortness of breath,  hypoxia EXAM: PORTABLE CHEST - 1 VIEW COMPARISON:  02/25/2015 FINDINGS: Cardiac shadow remains enlarged. A pacing device is again seen and stable. Increasing vascular congestion is noted when compared with the prior exam. Some mild pulmonary edema is noted in the bases particularly on the  right. A nodular density is suggested in the left mid lung slightly better visualized on the current exam. No bony abnormality is noted. IMPRESSION: Increasing vascular congestion and pulmonary edema. Nodular density in the left mid lung which is stable from previous exam. Electronically Signed   By: Inez Catalina M.D.   On: 02/26/2015 12:38     STUDIES:  12/29 Echo>   CULTURES: 12/28 blood >  ANTIBIOTICS: 12/28 Ceftriaxone > 12/28 Azithro >   SIGNIFICANT EVENTS: 12/28 admission 12/29 move to ICU  LINES/TUBES: 12/29 ETT  DISCUSSION: 81 y/o female with a past medical history of COPD admitted on 12/29 with a presumed COPD exacerbation after complaining of several days of dyspnea.  However her status dramatically worsened on hospital day 2 with CXR findings worrisome for likely pulmonary edema.  She has not been febrile or had an elevated WBC, so doubt pneumonia.  She has edema on exam so suspect acute decompensated heart failure.  Has chronic a-fib.  ASSESSMENT / PLAN:  PULMONARY A: COPD exacerbation > still wheezing today which could be due to pulmonary edema Acute respiratory failure with hypoxemia>  P:   Intubate now Change steroids to solumedrol Continue bronchodilators : duoneb q6h Add brovana/pulmicort bid Full vent support Daily CXR/WUA/SBT ABG after intubation  CARDIOVASCULAR A:  Acute decompensated CHF, systolic function unknown Afib P:  Echo Diurese now Tele Metoprolol BID  RENAL A:   Mild CKD P:   Monitor BMET and UOP Replace electrolytes as needed   GASTROINTESTINAL A:   No acute issues P:   OG tube Tube feeding per protocol GI prophylaxis with pantoprazole  HEMATOLOGIC A:   No acute issues P:  Monitor for bleeding  INFECTIOUS A:   Currently being treated for CAP, doubt she has this P:   Likely stop antibiotics 12/30  ENDOCRINE A:   No acute issues   P:   Monitor glucose on steroids  NEUROLOGIC A:   Sedation needs for  ventilator synchrony P:   RASS goal: -1 Propofol gtt Fentanyl PRN   FAMILY  - Updates: daughter [name] updated by phone, [name] updated bedside  - Inter-disciplinary family meet or Palliative Care meeting due by:  Day 7   My cc time 45 minutes  Roselie Awkward, MD [facility] [facility]M Pager: [phone] Cell: [phone] After 3pm or if no response, call [phone]   02/26/2015, 12:53 PM

## 2015-02-26 NOTE — Progress Notes (Signed)
Echocardiogram 2D Echocardiogram has been performed.  [name] 02/26/2015, 3:32 PM

## 2015-02-26 NOTE — Progress Notes (Signed)
Family Medicine Teaching Service Daily Progress Note Intern Pager: [phone]  Patient name: [name] Medical record number: [id number] Date of birth: [birth year] Age: 81 y.o. Gender: female  Primary Care Provider: MCDIARMID,TODD D, MD Consultants: None Code Status: Full  Pt Overview and Major Events to Date:  12/27 - Admitted with dyspnea  Assessment and Plan: [name] is a 81 y.o. female presenting with dyspnea, worsening cough, and increased sputum production. PMH is significant for COPD, AFib, HTN, COPD, CKD Stage III, sick sinus syndrome w/ pacemaker, and CAD s/p PCI.   Dyspnea / COPD: Likely COPD exacerbation vs CAP given increased cough and increased sputum production. CXR read as concerning for pneumonia. Well's Score of 0. Patient may also have volume overload/CHF playing a role, given her reports of orthopnea, weight gain, and increased leg swelling.  - Azithromycin and CTX (12/27- ). Plan on 5 day course - Prednisone 50mg  daily for 5 days - continue Dulera 2 puffs BID and Spiriva daily  - Dounebs q4h and q2h prn  - supplemental O2 prn, wean O2 as tolerated  - Anticipate transfer to floor when stable - Will check BNP and echo - Will give empiric dose of lasix 20mg  IV - Strict I/Os, daily weights  HTN: Mildly Hypertensive on admission.  -continue Metoprolol XL 50 mg daily   Hyperglycemia. CBG 330s on prednisone -Start SSI  H/o AFib: Patient not in Afib on EKG in ED. On long term coumadin for Afib. INR 2.21 at admission.  -continue amiodarone 200 mg daily and metoprolol XL 50 mg daily  -coumadin per pharmacy  -monitor INR   CKD Stage III: Solitary kidney. Patient has h/o transitional cell cancer. Baseline Cr ~1.3. Cr trending up -BMET in AM to monitor Cr   CAD s/p PCI with h/o MI -continue atorvastatin 40 mg daily   Anxiety: Stable.  -continue home Ativan 0.5 mg q6h prn   Osteoarthritis:  -continue Tramadol 50 mg q6h prn   FEN/GI: Heart healthy  diet PPx: Warfarin  Disposition: Admitted to telemetry pending above work up. Anticipate discharge home once respiratory status improves.   Subjective:  Continues to feel short of breath. No chest pain.   Objective: Temp:  [97.3 F (36.3 C)-98.2 F (36.8 C)] 98.2 F (36.8 C) (12/29 0458) Pulse Rate:  [65-89] 89 (12/29 0458) Resp:  [18-23] 18 (12/29 0458) BP: (108-127)/(44-70) 108/44 mmHg (12/29 0458) SpO2:  [93 %-100 %] 93 % (12/29 0458) Weight:  [171 lb 11.8 oz (77.9 kg)] 171 lb 11.8 oz (77.9 kg) (12/29 0500) Physical Exam: General: 81 year old woman in NAD sitting in hospital bed, [place] in place, speaking in full sentences Cardiovascular: RRR, no murmurs Respiratory: NWOB. Good airflow. Diffuse scattered wheezes noted with faint bibasilar crackles.  Abdomen: Soft, nontender, nondistended Extremities: 1+ edema to knees bilaterally  Laboratory/Imaging/Diagnostic Tests:  Recent Labs Lab 02/25/15 0050 02/26/15 0315  WBC 6.3 12.1*  HGB 10.7* 9.2*  HCT 35.1* 30.0*  PLT 210 211    Recent Labs Lab 02/25/15 0050 02/25/15 0800  NA 143 139  K 3.6 3.8  CL 108 104  CO2 25 24  BUN 20 24*  CREATININE 1.35* 1.76*  CALCIUM 8.5* 8.3*  PROT 5.6*  --   BILITOT 0.4  --   ALKPHOS 67  --   ALT 26  --   AST 31  --   GLUCOSE 110* 323*     Recent Labs Lab 02/25/15 0050 02/26/15 0315  INR 2.21* 3.10*  Vivi Barrack, MD 02/26/2015, 8:34 AM PGY-2, [place] Intern pager: [phone], text pages welcome\

## 2015-02-26 NOTE — Progress Notes (Signed)
Early this AM at the start of shift, pt voice that she is unable to breath and "catch her breat". No O2 noted, RN applied O2 and breathing treatment given per RT. Lungs sounds noted wheezes on inspiratory and expiratory bilaterally. Patient verbalized that she was feeling better.   After lasix given per order, Patient desat in the low 50s on 2LNC and appears discolored. Non-rebrather applied, and RAPID called MD at bedside with orders. RT applied bipap. Sat increase in the high 90s on bipap now. Will transfer patient to a higher level of care at this time.    Ave Filter, RN

## 2015-02-27 ENCOUNTER — Inpatient Hospital Stay (HOSPITAL_COMMUNITY): Payer: Medicare Other

## 2015-02-27 LAB — CBC WITH DIFFERENTIAL/PLATELET
Basophils Absolute: 0 10*3/uL (ref 0.0–0.1)
Basophils Relative: 0 %
EOS ABS: 0 10*3/uL (ref 0.0–0.7)
Eosinophils Relative: 0 %
HEMATOCRIT: 31.2 % — AB (ref 36.0–46.0)
HEMOGLOBIN: 9.8 g/dL — AB (ref 12.0–15.0)
LYMPHS ABS: 0.4 10*3/uL — AB (ref 0.7–4.0)
LYMPHS PCT: 4 %
MCH: 30.1 pg (ref 26.0–34.0)
MCHC: 31.4 g/dL (ref 30.0–36.0)
MCV: 95.7 fL (ref 78.0–100.0)
MONOS PCT: 2 %
Monocytes Absolute: 0.2 10*3/uL (ref 0.1–1.0)
NEUTROS PCT: 94 %
Neutro Abs: 9.3 10*3/uL — ABNORMAL HIGH (ref 1.7–7.7)
Platelets: 207 10*3/uL (ref 150–400)
RBC: 3.26 MIL/uL — AB (ref 3.87–5.11)
RDW: 14.7 % (ref 11.5–15.5)
WBC: 9.9 10*3/uL (ref 4.0–10.5)

## 2015-02-27 LAB — BASIC METABOLIC PANEL
Anion gap: 11 (ref 5–15)
BUN: 37 mg/dL — ABNORMAL HIGH (ref 6–20)
CHLORIDE: 105 mmol/L (ref 101–111)
CO2: 26 mmol/L (ref 22–32)
CREATININE: 1.42 mg/dL — AB (ref 0.44–1.00)
Calcium: 8.3 mg/dL — ABNORMAL LOW (ref 8.9–10.3)
GFR calc non Af Amer: 32 mL/min — ABNORMAL LOW (ref >60)
GFR, EST AFRICAN AMERICAN: 37 mL/min — AB (ref >60)
Glucose, Bld: 153 mg/dL — ABNORMAL HIGH (ref 65–99)
POTASSIUM: 4.3 mmol/L (ref 3.5–5.1)
Sodium: 142 mmol/L (ref 135–145)

## 2015-02-27 LAB — TROPONIN I: Troponin I: <0.03 ng/mL (ref <0.031)

## 2015-02-27 LAB — GLUCOSE, CAPILLARY
GLUCOSE-CAPILLARY: 120 mg/dL — AB (ref 65–99)
GLUCOSE-CAPILLARY: 136 mg/dL — AB (ref 65–99)
Glucose-Capillary: 151 mg/dL — ABNORMAL HIGH (ref 65–99)
Glucose-Capillary: 143 mg/dL — ABNORMAL HIGH (ref 65–99)

## 2015-02-27 LAB — PROTIME-INR
INR: 2.98 — ABNORMAL HIGH (ref 0.00–1.49)
Prothrombin Time: 30.4 seconds — ABNORMAL HIGH (ref 11.6–15.2)

## 2015-02-27 MED ORDER — FUROSEMIDE 10 MG/ML IJ SOLN
40.0000 mg | Freq: Once | INTRAMUSCULAR | Status: AC
  Administered 2015-02-27: 40 mg via INTRAVENOUS
  Filled 2015-02-27: qty 4

## 2015-02-27 MED ORDER — METOPROLOL TARTRATE 25 MG/10 ML ORAL SUSPENSION
25.0000 mg | Freq: Two times a day (BID) | ORAL | Status: DC
  Filled 2015-02-27: qty 10

## 2015-02-27 MED ORDER — ATORVASTATIN CALCIUM 40 MG PO TABS
40.0000 mg | ORAL_TABLET | Freq: Every day | ORAL | Status: DC
  Administered 2015-02-28: 40 mg
  Filled 2015-02-27: qty 1

## 2015-02-27 MED ORDER — ACETAMINOPHEN 160 MG/5ML PO SOLN
650.0000 mg | Freq: Four times a day (QID) | ORAL | Status: DC | PRN
  Administered 2015-03-01 – 2015-03-04 (×3): 650 mg via ORAL
  Filled 2015-02-27 (×4): qty 20.3

## 2015-02-27 MED ORDER — METOPROLOL TARTRATE 25 MG/10 ML ORAL SUSPENSION: 25.0000 mg | Freq: Two times a day (BID) | ORAL | Status: DC

## 2015-02-27 MED ORDER — VITAL HIGH PROTEIN PO LIQD
1000.0000 mL | ORAL | Status: DC
  Administered 2015-02-27: 1000 mL
  Filled 2015-02-27 (×3): qty 1000

## 2015-02-27 MED ORDER — METHYLPREDNISOLONE SODIUM SUCC 125 MG IJ SOLR
40.0000 mg | Freq: Three times a day (TID) | INTRAMUSCULAR | Status: DC
  Administered 2015-02-27 – 2015-03-01 (×6): 40 mg via INTRAVENOUS
  Filled 2015-02-27 (×6): qty 2

## 2015-02-27 MED ORDER — INSULIN ASPART 100 UNIT/ML ~~LOC~~ SOLN
0.0000 [IU] | SUBCUTANEOUS | Status: DC
  Administered 2015-02-27 – 2015-02-28 (×5): 2 [IU] via SUBCUTANEOUS

## 2015-02-27 MED ORDER — WARFARIN SODIUM 3 MG PO TABS
1.5000 mg | ORAL_TABLET | Freq: Every day | ORAL | Status: DC
  Administered 2015-02-27: 1.5 mg via ORAL
  Filled 2015-02-27: qty 0.5

## 2015-02-27 MED ORDER — PANTOPRAZOLE SODIUM 40 MG PO PACK
40.0000 mg | PACK | Freq: Every day | ORAL | Status: DC
  Administered 2015-02-27 – 2015-02-28 (×2): 40 mg
  Filled 2015-02-27 (×2): qty 20

## 2015-02-27 NOTE — Progress Notes (Signed)
Family Medicine Teaching Service Daily Progress Note Intern Pager: [phone]  Patient name: [name] Medical record number: [id number] Date of birth: 1923/09/05 Age: 81 y.o. Gender: female  Primary Care Provider: MCDIARMID,TODD D, MD Consultants: None Code Status: Full  Pt Overview and Major Events to Date:  12/27 - Admitted with dyspnea 12/29 - Acutely worsened, intubated and transferred to ICU  Assessment and Plan: [name] is a 81 y.o. female presenting with dyspnea, worsening cough, and increased sputum production. PMH is significant for COPD, AFib, HTN, COPD, CKD Stage III, sick sinus syndrome w/ pacemaker, and CAD s/p PCI.   Acute Respiratory Failure. Likely acute CHF exacerbation. Echocardiogram with preserved EF but G2DD. Patient also has underlying COPD which is playing a role. Was initially treated for CAP due to CXR concerning for opacification, though less likely.  Well's Score of 0.  - CCM consulted, appreciate assistance - Continue full vent support - Azithromycin and CTX (12/27- ).  - Prednisone transitioned to solumedrol by CCM - continue brovana and pulmicort prn - Duonebs q6h  - Continue diuresis   HTN: Mildly Hypertensive on admission.  -continue Metoprolol XL 50 mg daily   Hyperglycemia. Elevated due to steroids.  - Monitor CBGs. - SSI  H/o AFib: Patient not in Afib on EKG in ED. On long term coumadin for Afib. INR 2.21 at admission.  -continue amiodarone 200 mg daily and metoprolol XL 50 mg daily  -coumadin per pharmacy  -monitor INR   CKD Stage III: Solitary kidney. Patient has h/o transitional cell cancer. Baseline Cr ~1.3. Cr trending up -BMET in AM to monitor Cr   CAD s/p PCI with h/o MI -continue atorvastatin 40 mg daily   Anxiety: Stable.  -continue home Ativan 0.5 mg q6h prn   Osteoarthritis:  -continue Tramadol 50 mg q6h prn   FEN/GI: Heart healthy diet PPx: Warfarin  Disposition: Admitted to telemetry pending above work  up. Anticipate discharge home once respiratory status improves.   Subjective:  Intubated. Follows commands. Undergoing vent wean currently.   Objective: Temp:  [97 F (36.1 C)-98.5 F (36.9 C)] 97 F (36.1 C) (12/30 0758) Pulse Rate:  [59-70] 61 (12/30 0800) Resp:  [14-33] 24 (12/30 0800) BP: (84-192)/(47-119) 128/57 mmHg (12/30 0800) SpO2:  [45 %-100 %] 100 % (12/30 0800) FiO2 (%):  [40 %-80 %] 40 % (12/30 0800) Weight:  [160 lb 11.5 oz (72.9 kg)] 160 lb 11.5 oz (72.9 kg) (12/30 0600) Physical Exam: General: 81 year old woman , intubated Cardiovascular: RRR, no murmurs Respiratory: Intubated. Diffuse scattered wheezes noted with faint bibasilar crackles.  Abdomen: Soft, nontender, nondistended Extremities: 1+ edema to knees bilaterally  Laboratory/Imaging/Diagnostic Tests:  Recent Labs Lab 02/25/15 0050 02/26/15 0315 02/27/15 0311  WBC 6.3 12.1* 9.9  HGB 10.7* 9.2* 9.8*  HCT 35.1* 30.0* 31.2*  PLT 210 211 207    Recent Labs Lab 02/25/15 0050  02/26/15 1151 02/26/15 1430 02/27/15 0311  NA 143  < > 140 142 142  K 3.6  < > 4.9 5.0 4.3  CL 108  < > 105 103 105  CO2 25  < > 25 28 26   BUN 20  < > 29* 30* 37*  CREATININE 1.35*  < > 1.40* 1.46* 1.42*  CALCIUM 8.5*  < > 8.7* 8.6* 8.3*  PROT 5.6*  --   --   --   --   BILITOT 0.4  --   --   --   --   ALKPHOS 67  --   --   --   --  ALT 26  --   --   --   --   AST 31  --   --   --   --   GLUCOSE 110*  < > 177* 118* 153*  < > = values in this interval not displayed.   Recent Labs Lab 02/25/15 0050 02/26/15 0315 02/27/15 0311  INR 2.21* 3.10* 2.98*   ABG    Component Value Date/Time   PHART 7.331* 02/26/2015 1634   PCO2ART 50.8* 02/26/2015 1634   PO2ART 111.0* 02/26/2015 1634   HCO3 26.9* 02/26/2015 1634   TCO2 28 02/26/2015 1634   ACIDBASEDEF 4.0* 02/26/2015 1155   O2SAT 98.0 02/26/2015 1634   Trop <0.03 BNP 1570  Echo: EF 55-60%. Grade 2 diastolic dysfunction  Portable Chest Xray  02/27/2015   CLINICAL DATA:  COPD, follow-up respiratory failure. EXAM: PORTABLE CHEST 1 VIEW COMPARISON:  Chest x-rays dated 02/26/2015 and 02/25/2015. Comparison also made to earlier chest x-ray of 01/08/2012. FINDINGS: Endotracheal tube appears well positioned with tip just above the level of the carina. Enteric tube passes below the diaphragm. Right chest wall pacemaker/AICD unchanged in position. Mild cardiomegaly is unchanged. There is mild central pulmonary vascular congestion but no overt alveolar pulmonary edema. Appearance of the lungs is not significantly changed compared to the earlier chest x-ray of 01/08/2012, perhaps mild interstitial edema at the lung bases. Suspect some degree of chronic underlying interstitial fibrosis bilaterally. No pleural effusion seen. No pneumothorax. IMPRESSION: 1. Mild central pulmonary vascular congestion, and mild bibasilar edema, suggesting mild volume overload. No overt alveolar pulmonary edema. Lungs otherwise not significantly changed compared to a previous exam dated 01/08/2012. No evidence of pneumonia. 2. Mild cardiomegaly stable. 3. Tubes and lines remain adequately positioned. Electronically Signed   By: Franki Cabot M.D.   On: 02/27/2015 08:03   Portable Chest Xray  02/26/2015  CLINICAL DATA:  Acute respiratory failure with hypoxemia. Intubated. EXAM: PORTABLE CHEST 1 VIEW COMPARISON:  Chest radiograph from earlier today. FINDINGS: Stable configuration of 2 lead right subclavian pacemaker. Endotracheal tube tip is 2.5 cm above the carina. Enteric tube enters the stomach with the tip not seen on this image. Stable cardiomediastinal silhouette with mild cardiomegaly. No pneumothorax. No pleural effusion. Patchy consolidation at the right greater than left lung bases is not appreciably changed. No overt pulmonary edema. IMPRESSION: 1. Endotracheal tube tip is 2.5 cm above the carina. 2. Stable mild cardiomegaly without overt pulmonary edema. 3. Patchy consolidation at the  right greater than left lung bases, not appreciably changed, favor multifocal pneumonia or aspiration. Electronically Signed   By: Ilona Sorrel M.D.   On: 02/26/2015 13:29   Dg Chest Port 1 View  02/26/2015  CLINICAL DATA:  Shortness of breath, hypoxia EXAM: PORTABLE CHEST - 1 VIEW COMPARISON:  02/25/2015 FINDINGS: Cardiac shadow remains enlarged. A pacing device is again seen and stable. Increasing vascular congestion is noted when compared with the prior exam. Some mild pulmonary edema is noted in the bases particularly on the right. A nodular density is suggested in the left mid lung slightly better visualized on the current exam. No bony abnormality is noted. IMPRESSION: Increasing vascular congestion and pulmonary edema. Nodular density in the left mid lung which is stable from previous exam. Electronically Signed   By: Inez Catalina M.D.   On: 02/26/2015 12:38   Vivi Barrack, MD 02/27/2015, 8:13 AM PGY-2, [place] Intern pager: [phone], text pages welcome

## 2015-02-27 NOTE — Progress Notes (Signed)
PT Cancellation Note  Patient Details Name: [name] MRN: [id number] DOB: 09-23-1923   Cancelled Treatment:    Reason Eval/Treat Not Completed: Patient not medically ready. Pt was emergently intubated and transferred to 2s yesterday 12/29. Patient not appropriate for PT at this time. Please re-consult PT when appropriate.   Kingsley Callander 02/27/2015, 1:12 PM  Kittie Plater, PT, DPT Pager #: [phone] Office #: [phone]

## 2015-02-27 NOTE — Progress Notes (Signed)
Initial Nutrition Assessment  DOCUMENTATION CODES:   Not applicable  INTERVENTION:    Increase Vital High Protein formula by 10 ml every 4 hours to goal rate of 55 ml/hr  TF regimen to provide 1320 kcals, 115 gm protein, 1103 ml of free water  NUTRITION DIAGNOSIS:   Inadequate oral intake related to inability to eat as evidenced by NPO status  GOAL:   Patient will meet greater than or equal to 90% of their needs   MONITOR:   TF tolerance, Vent status, Labs, Weight trends  REASON FOR ASSESSMENT:   Consult Assessment of nutrition requirement/status  ASSESSMENT:   81 y/o Female admitted on 12/28 with a chief complaint of dyspnea. She was admitted to the [facility] with a presumed diagnosis of COPD exacerbation. She was also treated for pneumonia with IVF and IV antibiotics. She apparently initially improved but after 24 hours her dyspnea worsened and PCCM was consulted this morning for an abrupt change in her dyspnea and the development of acute hypoxemic respiratory failure. No further history could be obtained from the patient due to severe dyspnea.   Patient is currently intubated on ventilator support MV: 6.2 L/min Temp (24hrs), Avg:97.9 F (36.6 C), Min:97 F (36.1 C), Max:98.5 F (36.9 C)   Current TF regimen: Vital High Protein formula at 40 ml/hr via OGT providing 960 kcals, 84 gm protein, 803 ml of free water.  RD unable to complete Nutrition Focused Physical Exam at this time.  Diet Order:  Diet NPO time specified  Skin:  Reviewed, no issues  Last BM:  12/26  Height:   Ht Readings from Last 1 Encounters:  02/25/15 5\' 5"  (1.651 m)    Weight:   Wt Readings from Last 1 Encounters:  02/27/15 160 lb 11.5 oz (72.9 kg)    Ideal Body Weight:  56.8 kg  BMI:  Body mass index is 26.74 kg/(m^2).  Estimated Nutritional Needs:   Kcal:  1257  Protein:  105-115 gm  Fluid:  per MD  EDUCATION NEEDS:   No education needs identified at this time  Arthur Holms, RD, LDN Pager #: [phone] After-Hours Pager #: [phone]

## 2015-02-27 NOTE — Progress Notes (Signed)
[place] for Coumadin Indication: atrial fibrillation  Allergies  Allergen Reactions  . Baclofen Other (See Comments)    Confusion occurred with taking 3 at the same time.  . Codeine Phosphate Nausea Only  . Norco [Hydrocodone-Acetaminophen] Nausea And Vomiting  . Hydrochlorothiazide Other (See Comments)    Possible acute gouty arthritis of wrist  . Montelukast Sodium Other (See Comments)    Unspecified reaction.     Patient Measurements: Height: 5\' 5"  (165.1 cm) Weight: 160 lb 11.5 oz (72.9 kg) IBW/kg (Calculated) : 57  Vital Signs: Temp: 97 F (36.1 C) (12/30 0758) Temp Source: Axillary (12/30 0758) BP: 112/53 mmHg (12/30 1000) Pulse Rate: 59 (12/30 1000)  Labs:  Recent Labs  02/25/15 0050  02/26/15 0315 02/26/15 1151 02/26/15 1430 02/26/15 1815 02/26/15 2359 02/27/15 0311  HGB 10.7*  --  9.2*  --   --   --   --  9.8*  HCT 35.1*  --  30.0*  --   --   --   --  31.2*  PLT 210  --  211  --   --   --   --  207  LABPROT 24.3*  --  31.4*  --   --   --   --  30.4*  INR 2.21*  --  3.10*  --   --   --   --  2.98*  CREATININE 1.35*  < >  --  1.40* 1.46*  --   --  1.42*  TROPONINI  --   --   --  <0.03  --  0.04* <0.03  --   < > = values in this interval not displayed.  Estimated Creatinine Clearance: 26.9 mL/min (by C-G formula based on Cr of 1.42).  Assessment: 81 y.o. F presented with dyspnea and cough. Pt on coumadin PTA for afib. Home dose: 2mg  daily except for 1.5mg  on Tues and Thur.  CBC stable, has skin tear from hitting leg on bedside table. INR therapeutic at 2.98 but at high end of range.  Intubated yesterday.  No bleeding reported.    Goal of Therapy:  INR 2-3   Plan:  - decrease to coumadin 1.5 mg per tube/po daily - Daily INR  Eudelia Bunch, Pharm.D. [id number] 02/27/2015 11:52 AM

## 2015-02-27 NOTE — Discharge Summary (Signed)
[facility] Discharge Summary  Patient name: [name] Medical record number: [id number] Date of birth: [birth year] Age: 81 y.o. Gender: female Date of Admission: 02/25/2015  Date of Discharge: 03/06/15 Admitting Physician: Zenia Resides, MD  Primary Care Provider: MCDIARMID,TODD D, MD Consultants: CCM  Indication for Hospitalization: Dyspnea  Discharge Diagnoses/Problem List:  COPD, AFib, HTN, COPD, CKD Stage III, sick sinus syndrome w/ pacemaker, and CAD s/p PCI.   Disposition: Home with home health services  Discharge Condition: stable  Discharge Exam:  Gen: appears well, no acute distress, sitting up in chair in room Oropharynx: clear, moist CV: regular rate and rythm. S1 & S2 audible Resp: on 2L by [place], some shortness of breath but speaking in full sentences, inspiratory and expiratory wheezes bilaterally. No accessory muscle usage GI: bowel sounds normal, no tenderness to palpation  Brief Hospital Course:  Patient is an 81 year old female who presented from home with worsening dyspnea, cough, and increased sputum production.   She was initially treated as a presumed COPD exacerbation with steroids, azithromycin, and bronchodilators given her constellation of symptoms. Additionally, her CXR had a questionable opacity show she was given CAP coverage with rocephin as well. On  02/26/15, the patient acutely decompensated and required intubation and transfer to the ICU. She was also diuresed aggressively with lasix for acute on chronic CHF.  Echocardiogram showed an EF of 55-60% with grade 2 diastolic dysfunction. She was extubated and transferred to the step down unit on 03/01/15.   She was continued on Brovana, Pulmicort, and Spiriva while in the hospital and was given Duonebs. She also received a 5 day course of oral prednisone. She required supplemental oxygen throughout her stay and she was discharged with oxygen tank.   The patient's other medical  problems were stable during this admission and she was continued on her home medications.   Issues for Follow Up:  1. Oxygen requirement: On discharge, patient had O2 requirement of 2L. Will need to assess whether patient can be weaned off oxygen. 2. CXR showed chronic density over the anterior aspect of the left fifth rib (unchangedsince March of 2016). May desire to investigate further.  3. Pt on amiodarone, may consider changing this medication as medication has potential to cause pulmonary toxicity 4. COPD management: Patient sent home with Spiriva and Advair, will need to ensure she is taking these medications  Significant Procedures: Intubation on 03/01/15  Significant Labs and Imaging:   Recent Labs Lab 02/25/15 0050 02/26/15 0315 02/27/15 0311  WBC 6.3 12.1* 9.9  HGB 10.7* 9.2* 9.8*  HCT 35.1* 30.0* 31.2*  PLT 210 211 207    Recent Labs Lab 02/25/15 0050 02/25/15 0800 02/26/15 1151 02/26/15 1430 02/27/15 0311  NA 143 139 140 142 142  K 3.6 3.8 4.9 5.0 4.3  CL 108 104 105 103 105  CO2 25 24 25 28 26   GLUCOSE 110* 323* 177* 118* 153*  BUN 20 24* 29* 30* 37*  CREATININE 1.35* 1.76* 1.40* 1.46* 1.42*  CALCIUM 8.5* 8.3* 8.7* 8.6* 8.3*  ALKPHOS 67  --   --   --   --   AST 31  --   --   --   --   ALT 26  --   --   --   --   ALBUMIN 3.2*  --   --   --   --     Results/Tests Pending at Time of Discharge: none  Discharge Medications:  Medication List    STOP taking these medications        simvastatin 40 MG tablet  Commonly known as:  ZOCOR      TAKE these medications        albuterol 108 (90 Base) MCG/ACT inhaler  Commonly known as:  VENTOLIN HFA  Inhale 2 puffs into the lungs every 6 (six) hours as needed. If needed for shortness of breath.     amiodarone 200 MG tablet  Commonly known as:  PACERONE  Take 1 tablet by mouth daily.     atorvastatin 40 MG tablet  Commonly known as:  LIPITOR  Take 1 tablet (40 mg total) by mouth daily.      cholecalciferol 1000 units tablet  Commonly known as:  VITAMIN D  Take 1,000 Units by mouth daily.     Fluticasone-Salmeterol 250-50 MCG/DOSE Aepb  Commonly known as:  ADVAIR DISKUS  Inhale 2 puffs into the lungs 2 (two) times daily.     ipratropium-albuterol 0.5-2.5 (3) MG/3ML Soln  Commonly known as:  DUONEB  Take 3 mLs by nebulization every 4 (four) hours as needed.     ipratropium-albuterol 0.5-2.5 (3) MG/3ML Soln  Commonly known as:  DUONEB  Take 3 mLs by nebulization 3 (three) times daily.     LORazepam 0.5 MG tablet  Commonly known as:  ATIVAN  TAKE 1 TABLET BY MOUTH EVERY 8 HOURS AS NEEDED FOR ANXIETY     metoprolol succinate 50 MG 24 hr tablet  Commonly known as:  TOPROL-XL  Take 1 tablet (50 mg total) by mouth daily. with food     nitroGLYCERIN 0.4 MG SL tablet  Commonly known as:  NITROSTAT  Place 0.4 mg under the tongue every 5 (five) minutes as needed for chest pain.     omeprazole 20 MG capsule  Commonly known as:  PRILOSEC  Take 1 capsule (20 mg total) by mouth daily.     tiotropium 18 MCG inhalation capsule  Commonly known as:  SPIRIVA  Place 1 capsule (18 mcg total) into inhaler and inhale daily.     traMADol 50 MG tablet  Commonly known as:  ULTRAM  Take 1 tablet (50 mg total) by mouth every 6 (six) hours as needed for moderate pain.     vitamin B-12 1000 MCG tablet  Commonly known as:  CYANOCOBALAMIN  Take 1,000 mcg by mouth daily.     warfarin 1 MG tablet  Commonly known as:  COUMADIN  TAKE AS DIRECTED BY ANTICOAGULATION CLINIC        Discharge Instructions: Please refer to Patient Instructions section of EMR for full details.  Patient was counseled important signs and symptoms that should prompt return to medical care, changes in medications, dietary instructions, activity restrictions, and follow up appointments.   Follow-Up Appointments:  Follow-up Information    Follow up with Marina Goodell, MD. Go on 03/09/2015.   Specialty:  Family  Medicine   Why:  hospital follow up at 3:45 PM   Contact information:   [place] [address] [phone]       Carlyle Dolly, MD 03/04/2015, 6:29 AM PGY-1, [place]

## 2015-02-27 NOTE — Progress Notes (Signed)
PULMONARY / CRITICAL CARE MEDICINE   Name: [name] MRN: [id number] DOB: [birth year]    ADMISSION DATE:  02/25/2015 CONSULTATION DATE:  02/26/2015  REFERRING MD:  FPTS  CHIEF COMPLAINT:  Short of breath  SUBJECTIVE:  Feels like she has no energy.  VITAL SIGNS: BP 112/53 mmHg  Pulse 59  Temp(Src) 97 F (36.1 C) (Axillary)  Resp 18  Ht 5\' 5"  (1.651 m)  Wt 160 lb 11.5 oz (72.9 kg)  BMI 26.74 kg/m2  SpO2 100%  VENTILATOR SETTINGS: Vent Mode:  [-] PSV;CPAP FiO2 (%):  [40 %-80 %] 40 % Set Rate:  [16 bmp] 16 bmp Vt Set:  [460 mL] 460 mL PEEP:  [5 cmH20] 5 cmH20 Pressure Support:  [10 cmH20] 10 cmH20 Plateau Pressure:  [18 cmH20-26 cmH20] 18 cmH20  INTAKE / OUTPUT: I/O last 3 completed shifts: In: [id number] [P.O.:240; I.V.:638.7; NG/GT:464.3; IV Piggyback:100] Out: 5725 [Urine:5725]  PHYSICAL EXAMINATION: General:  Ill appearing Neuro:  RASS 0, follows commands, moves all extremities HEENT:  Pupils reactive Cardiovascular:  Regular, no murmur Lungs:  B/l crackles, no wheeze Abdomen:  Soft, non tender Musculoskeletal:  No edema Skin:  No rashes  LABS:  BMET  Recent Labs Lab 02/26/15 1151 02/26/15 1430 02/27/15 0311  NA 140 142 142  K 4.9 5.0 4.3  CL 105 103 105  CO2 25 28 26   BUN 29* 30* 37*  CREATININE 1.40* 1.46* 1.42*  GLUCOSE 177* 118* 153*    Electrolytes  Recent Labs Lab 02/26/15 1151 02/26/15 1430 02/27/15 0311  CALCIUM 8.7* 8.6* 8.3*    CBC  Recent Labs Lab 02/25/15 0050 02/26/15 0315 02/27/15 0311  WBC 6.3 12.1* 9.9  HGB 10.7* 9.2* 9.8*  HCT 35.1* 30.0* 31.2*  PLT 210 211 207    Coag's  Recent Labs Lab 02/25/15 0050 02/26/15 0315 02/27/15 0311  INR 2.21* 3.10* 2.98*    ABG  Recent Labs Lab 02/26/15 1155 02/26/15 1634  PHART 7.086* 7.331*  PCO2ART 86.7* 50.8*  PO2ART 99.5 111.0*    Liver Enzymes  Recent Labs Lab 02/25/15 0050  AST 31  ALT 26  ALKPHOS 67  BILITOT 0.4  ALBUMIN 3.2*    Cardiac  Enzymes  Recent Labs Lab 02/26/15 1151 02/26/15 1815 02/26/15 2359  TROPONINI <0.03 0.04* <0.03    Glucose  Recent Labs Lab 02/26/15 1113 02/26/15 1815 02/26/15 1938 02/27/15 0756  GLUCAP 94 129* 130* 151*    Imaging Portable Chest Xray  02/27/2015  CLINICAL DATA:  COPD, follow-up respiratory failure. EXAM: PORTABLE CHEST 1 VIEW COMPARISON:  Chest x-rays dated 02/26/2015 and 02/25/2015. Comparison also made to earlier chest x-ray of 01/08/2012. FINDINGS: Endotracheal tube appears well positioned with tip just above the level of the carina. Enteric tube passes below the diaphragm. Right chest wall pacemaker/AICD unchanged in position. Mild cardiomegaly is unchanged. There is mild central pulmonary vascular congestion but no overt alveolar pulmonary edema. Appearance of the lungs is not significantly changed compared to the earlier chest x-ray of 01/08/2012, perhaps mild interstitial edema at the lung bases. Suspect some degree of chronic underlying interstitial fibrosis bilaterally. No pleural effusion seen. No pneumothorax. IMPRESSION: 1. Mild central pulmonary vascular congestion, and mild bibasilar edema, suggesting mild volume overload. No overt alveolar pulmonary edema. Lungs otherwise not significantly changed compared to a previous exam dated 01/08/2012. No evidence of pneumonia. 2. Mild cardiomegaly stable. 3. Tubes and lines remain adequately positioned. Electronically Signed   By: Franki Cabot M.D.   On: 02/27/2015 08:03  Portable Chest Xray  02/26/2015  CLINICAL DATA:  Acute respiratory failure with hypoxemia. Intubated. EXAM: PORTABLE CHEST 1 VIEW COMPARISON:  Chest radiograph from earlier today. FINDINGS: Stable configuration of 2 lead right subclavian pacemaker. Endotracheal tube tip is 2.5 cm above the carina. Enteric tube enters the stomach with the tip not seen on this image. Stable cardiomediastinal silhouette with mild cardiomegaly. No pneumothorax. No pleural  effusion. Patchy consolidation at the right greater than left lung bases is not appreciably changed. No overt pulmonary edema. IMPRESSION: 1. Endotracheal tube tip is 2.5 cm above the carina. 2. Stable mild cardiomegaly without overt pulmonary edema. 3. Patchy consolidation at the right greater than left lung bases, not appreciably changed, favor multifocal pneumonia or aspiration. Electronically Signed   By: Ilona Sorrel M.D.   On: 02/26/2015 13:29   Dg Chest Port 1 View  02/26/2015  CLINICAL DATA:  Shortness of breath, hypoxia EXAM: PORTABLE CHEST - 1 VIEW COMPARISON:  02/25/2015 FINDINGS: Cardiac shadow remains enlarged. A pacing device is again seen and stable. Increasing vascular congestion is noted when compared with the prior exam. Some mild pulmonary edema is noted in the bases particularly on the right. A nodular density is suggested in the left mid lung slightly better visualized on the current exam. No bony abnormality is noted. IMPRESSION: Increasing vascular congestion and pulmonary edema. Nodular density in the left mid lung which is stable from previous exam. Electronically Signed   By: Inez Catalina M.D.   On: 02/26/2015 12:38     STUDIES:  12/29 Echo >> EF 55 to 123456, grade 2 diastolic dysfx, mild MR  CULTURES:  ANTIBIOTICS: 12/28 Rocephin >> 12/30 12/28 Zithromax >>  SIGNIFICANT EVENTS: 12/28 Admit 12/29 Transfer to ICU  LINES/TUBES: 12/29 ETT >>  DISCUSSION: 81 yo female former smoker presented with progressive dyspnea from AECOPD and acute on chronic diastolic CHF.  Developed hypoxic/hypercapnic respiratory failure requiring intubation.  ASSESSMENT / PLAN:  PULMONARY A: Acute on chronic hypoxic/hypercapnic respiratory failure. AECOPD. P:   Pressure support wean as tolerated F/u CXR Continue brovana, pulmicort, duoneb Change solumedrol to 40 mg q8hr  CARDIOVASCULAR A:  Acute on chronic diastolic CHF. Hx of SSS s/p PM, HTN, A fib on  coumadin. Bradycardia. P:  Continue amiodarone, ASA, lipitor Hold lopressor 12/30 Coumadin per pharmacy Lasix 40 mg IV x one on 12/30  RENAL A:   CKD stage III >> baseline creatinine 1.46 from August 2016. P:   Monitor renal fx, urine outpt  GASTROINTESTINAL A:   Nutrition. P:   Tube feeds while on vent Protonix for SUP  HEMATOLOGIC A:   Anemia of critical illness, and chronic disease. P:  F/u CBC  INFECTIOUS A:   AECOPD >> PNA unlikely. P:   Day 3 of zithromax D/c rocephin 12/30  ENDOCRINE A:   Steroid induced hyperglycemia. P:   SSI  NEUROLOGIC A:   Acute encephalopathy 2nd to respiratory failure. P:   RASS goal: -1  Updated family at bedside.  CC time 38 minutes.  Chesley Mires, MD [facility] Pulmonary/Critical Care 02/27/2015, 11:01 AM Pager:  [phone] After 3pm call: [phone]

## 2015-02-28 ENCOUNTER — Inpatient Hospital Stay (HOSPITAL_COMMUNITY): Payer: Medicare Other

## 2015-02-28 LAB — GLUCOSE, CAPILLARY
GLUCOSE-CAPILLARY: 126 mg/dL — AB (ref 65–99)
GLUCOSE-CAPILLARY: 131 mg/dL — AB (ref 65–99)
GLUCOSE-CAPILLARY: 134 mg/dL — AB (ref 65–99)
Glucose-Capillary: 121 mg/dL — ABNORMAL HIGH (ref 65–99)
Glucose-Capillary: 128 mg/dL — ABNORMAL HIGH (ref 65–99)
Glucose-Capillary: 104 mg/dL — ABNORMAL HIGH (ref 65–99)

## 2015-02-28 LAB — CBC
HCT: 32.7 % — ABNORMAL LOW (ref 36.0–46.0)
Hemoglobin: 10.4 g/dL — ABNORMAL LOW (ref 12.0–15.0)
MCH: 30.3 pg (ref 26.0–34.0)
MCHC: 31.8 g/dL (ref 30.0–36.0)
MCV: 95.3 fL (ref 78.0–100.0)
PLATELETS: 224 10*3/uL (ref 150–400)
RBC: 3.43 MIL/uL — AB (ref 3.87–5.11)
RDW: 15 % (ref 11.5–15.5)
WBC: 9.9 10*3/uL (ref 4.0–10.5)

## 2015-02-28 LAB — BASIC METABOLIC PANEL
ANION GAP: 11 (ref 5–15)
BUN: 58 mg/dL — ABNORMAL HIGH (ref 6–20)
CALCIUM: 8.4 mg/dL — AB (ref 8.9–10.3)
CO2: 29 mmol/L (ref 22–32)
Chloride: 103 mmol/L (ref 101–111)
Creatinine, Ser: 1.58 mg/dL — ABNORMAL HIGH (ref 0.44–1.00)
GFR, EST AFRICAN AMERICAN: 32 mL/min — AB (ref >60)
GFR, EST NON AFRICAN AMERICAN: 28 mL/min — AB (ref >60)
GLUCOSE: 134 mg/dL — AB (ref 65–99)
POTASSIUM: 4.1 mmol/L (ref 3.5–5.1)
SODIUM: 143 mmol/L (ref 135–145)

## 2015-02-28 LAB — MAGNESIUM: MAGNESIUM: 1.9 mg/dL (ref 1.7–2.4)

## 2015-02-28 LAB — PROTIME-INR
INR: 3.79 — AB (ref 0.00–1.49)
Prothrombin Time: 36.5 seconds — ABNORMAL HIGH (ref 11.6–15.2)

## 2015-02-28 LAB — PHOSPHORUS: PHOSPHORUS: 4.2 mg/dL (ref 2.5–4.6)

## 2015-02-28 MED ORDER — CHLORHEXIDINE GLUCONATE 0.12 % MT SOLN
15.0000 mL | Freq: Two times a day (BID) | OROMUCOSAL | Status: DC
  Administered 2015-02-28 – 2015-03-05 (×12): 15 mL via OROMUCOSAL
  Filled 2015-02-28 (×10): qty 15

## 2015-02-28 MED ORDER — PANTOPRAZOLE SODIUM 40 MG PO TBEC
40.0000 mg | DELAYED_RELEASE_TABLET | Freq: Every day | ORAL | Status: DC
  Administered 2015-03-01 – 2015-03-06 (×6): 40 mg via ORAL
  Filled 2015-02-28 (×5): qty 1

## 2015-02-28 MED ORDER — CETYLPYRIDINIUM CHLORIDE 0.05 % MT LIQD
7.0000 mL | Freq: Two times a day (BID) | OROMUCOSAL | Status: DC
  Administered 2015-02-28 – 2015-03-06 (×9): 7 mL via OROMUCOSAL

## 2015-02-28 MED ORDER — INSULIN ASPART 100 UNIT/ML ~~LOC~~ SOLN: 0.0000 [IU] | Freq: Every day | SUBCUTANEOUS | Status: DC

## 2015-02-28 MED ORDER — INSULIN ASPART 100 UNIT/ML ~~LOC~~ SOLN
0.0000 [IU] | Freq: Three times a day (TID) | SUBCUTANEOUS | Status: DC
  Administered 2015-02-28 (×2): 2 [IU] via SUBCUTANEOUS

## 2015-02-28 MED ORDER — ATORVASTATIN CALCIUM 40 MG PO TABS
40.0000 mg | ORAL_TABLET | Freq: Every day | ORAL | Status: DC
  Administered 2015-03-01 – 2015-03-06 (×6): 40 mg via ORAL
  Filled 2015-02-28 (×6): qty 1

## 2015-02-28 MED ORDER — FENTANYL CITRATE (PF) 100 MCG/2ML IJ SOLN: 25.0000 ug | INTRAMUSCULAR | Status: DC | PRN

## 2015-02-28 NOTE — Procedures (Signed)
Extubation Procedure Note  Patient Details:   Name: [name] DOB: [birth year] MRN: [id number]   Airway Documentation:  Airway 7.5 mm (Active)  Secured at (cm) 22 cm 02/28/2015  7:36 AM  Measured From Lips 02/28/2015  7:36 AM  [place] 02/28/2015  7:36 AM  Secured By [employer] 02/28/2015  7:36 AM  Tube Holder Repositioned Yes 02/28/2015  7:36 AM  Cuff Pressure (cm H2O) 27 cm H2O 02/27/2015  7:05 PM  Site Condition Dry 02/28/2015  7:36 AM    Evaluation  O2 sats: stable throughout Complications: No apparent complications Patient did tolerate procedure well. Bilateral Breath Sounds: Clear, Diminished   Yes   Pt extubated to 4L N/C, no stridor noted.  RN at bedside.    Donnetta Hail 02/28/2015, 10:47 AM

## 2015-02-28 NOTE — Progress Notes (Signed)
PULMONARY / CRITICAL CARE MEDICINE   Name: [name] MRN: [id number] DOB: 06-15-1923    ADMISSION DATE:  02/25/2015 CONSULTATION DATE:  02/26/2015  REFERRING MD:  FPTS  CHIEF COMPLAINT:  Short of breath  SUBJECTIVE:  Tolerating pressure support.  VITAL SIGNS: BP 97/52 mmHg  Pulse 59  Temp(Src) 97.5 F (36.4 C) (Oral)  Resp 15  Ht 5\' 5"  (1.651 m)  Wt 159 lb 13.3 oz (72.5 kg)  BMI 26.60 kg/m2  SpO2 97%  VENTILATOR SETTINGS: Vent Mode:  [-] PRVC FiO2 (%):  [40 %] 40 % Set Rate:  [16 bmp] 16 bmp Vt Set:  [460 mL] 460 mL PEEP:  [5 cmH20] 5 cmH20 Pressure Support:  [5 cmH20-10 cmH20] 5 cmH20 Plateau Pressure:  [16 cmH20-18 cmH20] 16 cmH20  INTAKE / OUTPUT: I/O last 3 completed shifts: In: 2972.1 [I.V.:1133; NG/GT:1739.1; IV Piggyback:100] Out: 3515 [Urine:3515]  PHYSICAL EXAMINATION: General:  sedated Neuro:  RASS -2 HEENT:  Pupils reactive Cardiovascular:  Regular, no murmur Lungs:  Better air movement, no wheeze Abdomen:  Soft, non tender Musculoskeletal:  No edema Skin:  No rashes  LABS:  BMET  Recent Labs Lab 02/26/15 1430 02/27/15 0311 02/28/15 0357  NA 142 142 143  K 5.0 4.3 4.1  CL 103 105 103  CO2 28 26 29   BUN 30* 37* 58*  CREATININE 1.46* 1.42* 1.58*  GLUCOSE 118* 153* 134*    Electrolytes  Recent Labs Lab 02/26/15 1430 02/27/15 0311 02/28/15 0357  CALCIUM 8.6* 8.3* 8.4*  MG  --   --  1.9  PHOS  --   --  4.2    CBC  Recent Labs Lab 02/26/15 0315 02/27/15 0311 02/28/15 0357  WBC 12.1* 9.9 9.9  HGB 9.2* 9.8* 10.4*  HCT 30.0* 31.2* 32.7*  PLT 211 207 224    Coag's  Recent Labs Lab 02/26/15 0315 02/27/15 0311 02/28/15 0357  INR 3.10* 2.98* 3.79*    ABG  Recent Labs Lab 02/26/15 1155 02/26/15 1634  PHART 7.086* 7.331*  PCO2ART 86.7* 50.8*  PO2ART 99.5 111.0*    Liver Enzymes  Recent Labs Lab 02/25/15 0050  AST 31  ALT 26  ALKPHOS 67  BILITOT 0.4  ALBUMIN 3.2*    Cardiac Enzymes  Recent  Labs Lab 02/26/15 1151 02/26/15 1815 02/26/15 2359  TROPONINI <0.03 0.04* <0.03    Glucose  Recent Labs Lab 02/26/15 1938 02/27/15 0756 02/27/15 1205 02/27/15 1606 02/27/15 2006 02/28/15 0340  GLUCAP 130* 151* 120* 136* 143* 126*    Imaging No results found.   STUDIES:  12/29 Echo >> EF 55 to 123456, grade 2 diastolic dysfx, mild MR  CULTURES:  ANTIBIOTICS: 12/28 Rocephin >> 12/30 12/28 Zithromax >>  SIGNIFICANT EVENTS: 12/28 Admit 12/29 Transfer to ICU  LINES/TUBES: 12/29 ETT >>  DISCUSSION: 81 yo female former smoker presented with progressive dyspnea from AECOPD and acute on chronic diastolic CHF.  Developed hypoxic/hypercapnic respiratory failure requiring intubation.  ASSESSMENT / PLAN:  PULMONARY A: Acute on chronic hypoxic/hypercapnic respiratory failure. AECOPD. P:   Pressure support wean as tolerated >> might be ready for extubation soon F/u CXR Continue brovana, pulmicort, duoneb Continue solumedrol 40 mg q8hr  CARDIOVASCULAR A:  Acute on chronic diastolic CHF. Hx of SSS s/p PM, HTN, A fib on coumadin. Bradycardia. P:  Continue amiodarone, ASA, lipitor Holding lopressor since 12/30 Coumadin per pharmacy  RENAL A:   CKD stage III >> baseline creatinine 1.46 from August 2016. P:   Monitor renal fx, urine  outpt  GASTROINTESTINAL A:   Nutrition. P:   Tube feeds while on vent Protonix for SUP  HEMATOLOGIC A:   Anemia of critical illness, and chronic disease. P:  F/u CBC  INFECTIOUS A:   AECOPD >> PNA unlikely. P:   Day 4/5 of zithromax  ENDOCRINE A:   Steroid induced hyperglycemia. P:   SSI  NEUROLOGIC A:   Acute encephalopathy 2nd to respiratory failure. P:   RASS goal: 0  CC time 31 minutes.  Chesley Mires, MD [facility] Pulmonary/Critical Care 02/28/2015, 7:37 AM Pager:  [phone] After 3pm call: [phone]

## 2015-02-28 NOTE — Progress Notes (Signed)
[place] for Coumadin Indication: atrial fibrillation  Allergies  Allergen Reactions  . Baclofen Other (See Comments)    Confusion occurred with taking 3 at the same time.  . Codeine Phosphate Nausea Only  . Norco [Hydrocodone-Acetaminophen] Nausea And Vomiting  . Hydrochlorothiazide Other (See Comments)    Possible acute gouty arthritis of wrist  . Montelukast Sodium Other (See Comments)    Unspecified reaction.     Patient Measurements: Height: 5\' 5"  (165.1 cm) Weight: 159 lb 13.3 oz (72.5 kg) IBW/kg (Calculated) : 57  Vital Signs: Temp: 98.3 F (36.8 C) (12/31 0900) Temp Source: Axillary (12/31 0900) BP: 97/46 mmHg (12/31 0800) Pulse Rate: 59 (12/31 0800)  Labs:  Recent Labs  02/26/15 0315  02/26/15 1151 02/26/15 1430 02/26/15 1815 02/26/15 2359 02/27/15 0311 02/28/15 0357  HGB 9.2*  --   --   --   --   --  9.8* 10.4*  HCT 30.0*  --   --   --   --   --  31.2* 32.7*  PLT 211  --   --   --   --   --  207 224  LABPROT 31.4*  --   --   --   --   --  30.4* 36.5*  INR 3.10*  --   --   --   --   --  2.98* 3.79*  CREATININE  --   < > 1.40* 1.46*  --   --  1.42* 1.58*  TROPONINI  --   --  <0.03  --  0.04* <0.03  --   --   < > = values in this interval not displayed.  Estimated Creatinine Clearance: 24.1 mL/min (by C-G formula based on Cr of 1.58).  Assessment: 81 y.o. F presented with dyspnea and cough. Pt on coumadin PTA for afib. INR 3.79, supratherapeutic. Hgb 10.4, plt 224K. No bleeding noted per chart. On D#3 azithromycin and home dose amiodarone.  Home dose: 2mg  daily except for 1.5mg  on Tues and Thur.   Goal of Therapy:  INR 2-3   Plan:  - Hold coumadin today - Daily INR  Maryanna Shape, PharmD, BCPS  Clinical Pharmacist  Pager: [phone]  02/28/2015 9:24 AM

## 2015-02-28 NOTE — Progress Notes (Signed)
FPTS Social Note  Continuing to follow chart. Patient seen this AM and she is alert, and following commands. She is still on propofol for sedation with ETT in place on pressure support. Plan is to extubate soon. Azithromycin continued however, CCM leaning more towards COPD exacerbation. Will be happy to resume care once patient has been extubated. Appreciate care provided by CCM.  Cordelia Poche, MD PGY-3, [facility] 02/28/2015, 10:01 AM

## 2015-03-01 ENCOUNTER — Inpatient Hospital Stay (HOSPITAL_COMMUNITY): Payer: Medicare Other

## 2015-03-01 LAB — BASIC METABOLIC PANEL
ANION GAP: 10 (ref 5–15)
BUN: 64 mg/dL — ABNORMAL HIGH (ref 6–20)
CALCIUM: 8.5 mg/dL — AB (ref 8.9–10.3)
CO2: 28 mmol/L (ref 22–32)
Chloride: 104 mmol/L (ref 101–111)
Creatinine, Ser: 1.69 mg/dL — ABNORMAL HIGH (ref 0.44–1.00)
GFR, EST AFRICAN AMERICAN: 30 mL/min — AB (ref >60)
GFR, EST NON AFRICAN AMERICAN: 26 mL/min — AB (ref >60)
Glucose, Bld: 159 mg/dL — ABNORMAL HIGH (ref 65–99)
Potassium: 4 mmol/L (ref 3.5–5.1)
Sodium: 142 mmol/L (ref 135–145)

## 2015-03-01 LAB — CBC
HEMATOCRIT: 32.8 % — AB (ref 36.0–46.0)
Hemoglobin: 10.4 g/dL — ABNORMAL LOW (ref 12.0–15.0)
MCH: 30.2 pg (ref 26.0–34.0)
MCHC: 31.7 g/dL (ref 30.0–36.0)
MCV: 95.3 fL (ref 78.0–100.0)
PLATELETS: 226 10*3/uL (ref 150–400)
RBC: 3.44 MIL/uL — AB (ref 3.87–5.11)
RDW: 15.2 % (ref 11.5–15.5)
WBC: 8.1 10*3/uL (ref 4.0–10.5)

## 2015-03-01 LAB — GLUCOSE, CAPILLARY: Glucose-Capillary: 130 mg/dL — ABNORMAL HIGH (ref 65–99)

## 2015-03-01 LAB — PROTIME-INR
INR: 3.45 — AB (ref 0.00–1.49)
Prothrombin Time: 34 seconds — ABNORMAL HIGH (ref 11.6–15.2)

## 2015-03-01 MED ORDER — IPRATROPIUM-ALBUTEROL 0.5-2.5 (3) MG/3ML IN SOLN: 3.0000 mL | Freq: Four times a day (QID) | RESPIRATORY_TRACT | Status: DC

## 2015-03-01 MED ORDER — ALBUTEROL SULFATE (2.5 MG/3ML) 0.083% IN NEBU
2.5000 mg | INHALATION_SOLUTION | RESPIRATORY_TRACT | Status: DC | PRN
  Administered 2015-03-06: 2.5 mg via RESPIRATORY_TRACT
  Filled 2015-03-01: qty 3

## 2015-03-01 MED ORDER — PREDNISONE 20 MG PO TABS
30.0000 mg | ORAL_TABLET | Freq: Every day | ORAL | Status: DC
  Administered 2015-03-01 – 2015-03-02 (×2): 30 mg via ORAL
  Filled 2015-03-01: qty 3

## 2015-03-01 MED ORDER — TIOTROPIUM BROMIDE MONOHYDRATE 18 MCG IN CAPS
18.0000 ug | ORAL_CAPSULE | Freq: Every day | RESPIRATORY_TRACT | Status: DC
  Administered 2015-03-01 – 2015-03-06 (×4): 18 ug via RESPIRATORY_TRACT
  Filled 2015-03-01 (×2): qty 5

## 2015-03-01 NOTE — Plan of Care (Signed)
Problem: Nutrition: Goal: Adequate nutrition will be maintained Outcome: Progressing Advanced to carb modified diet. Pt tolerating well

## 2015-03-01 NOTE — Progress Notes (Addendum)
PULMONARY / CRITICAL CARE MEDICINE   Name: [name] MRN: [id number] DOB: [birth year]    ADMISSION DATE:  02/25/2015 CONSULTATION DATE:  02/26/2015  REFERRING MD:  FPTS  CHIEF COMPLAINT:  Short of breath  SUBJECTIVE:  Feels better.  Slept well.  VITAL SIGNS: BP 118/60 mmHg  Pulse 60  Temp(Src) 97.5 F (36.4 C) (Oral)  Resp 29  Ht 5\' 5"  (1.651 m)  Wt 159 lb 13.3 oz (72.5 kg)  BMI 26.60 kg/m2  SpO2 99%  VENTILATOR SETTINGS: Vent Mode:  [-] CPAP;PSV FiO2 (%):  [40 %] 40 % PEEP:  [5 cmH20] 5 cmH20 Pressure Support:  [10 cmH20] 10 cmH20  INTAKE / OUTPUT: I/O last 3 completed shifts: In: 2089.9 [P.O.:720; I.V.:509.9; NG/GT:860] Out: 1915 [Urine:1915]  PHYSICAL EXAMINATION: General:  pleasant Neuro: normal strength HEENT:  No stridor Cardiovascular:  Regular, no murmur Lungs: no wheeze Abdomen:  Soft, non tender Musculoskeletal:  No edema Skin:  No rashes  LABS:  BMET  Recent Labs Lab 02/27/15 0311 02/28/15 0357 03/01/15 0330  NA 142 143 142  K 4.3 4.1 4.0  CL 105 103 104  CO2 26 29 28   BUN 37* 58* 64*  CREATININE 1.42* 1.58* 1.69*  GLUCOSE 153* 134* 159*    Electrolytes  Recent Labs Lab 02/27/15 0311 02/28/15 0357 03/01/15 0330  CALCIUM 8.3* 8.4* 8.5*  MG  --  1.9  --   PHOS  --  4.2  --     CBC  Recent Labs Lab 02/27/15 0311 02/28/15 0357 03/01/15 0330  WBC 9.9 9.9 8.1  HGB 9.8* 10.4* 10.4*  HCT 31.2* 32.7* 32.8*  PLT 207 224 226    Coag's  Recent Labs Lab 02/27/15 0311 02/28/15 0357 03/01/15 0330  INR 2.98* 3.79* 3.45*    ABG  Recent Labs Lab 02/26/15 1155 02/26/15 1634  PHART 7.086* 7.331*  PCO2ART 86.7* 50.8*  PO2ART 99.5 111.0*    Liver Enzymes  Recent Labs Lab 02/25/15 0050  AST 31  ALT 26  ALKPHOS 67  BILITOT 0.4  ALBUMIN 3.2*    Cardiac Enzymes  Recent Labs Lab 02/26/15 1151 02/26/15 1815 02/26/15 2359  TROPONINI <0.03 0.04* <0.03    Glucose  Recent Labs Lab 02/28/15 0001  02/28/15 0340 02/28/15 0907 02/28/15 1211 02/28/15 1623 02/28/15 1943  GLUCAP 131* 126* 134* 121* 128* 104*    Imaging No results found.   STUDIES:  12/29 Echo >> EF 55 to 123456, grade 2 diastolic dysfx, mild MR  CULTURES:  ANTIBIOTICS: 12/28 Rocephin >> 12/30 12/28 Zithromax >>  SIGNIFICANT EVENTS: 12/28 Admit 12/29 Transfer to ICU 01/01 To floor  LINES/TUBES: 12/29 ETT >> 12/31  DISCUSSION: 82 yo female former smoker presented with progressive dyspnea from AECOPD and acute on chronic diastolic CHF.  Developed hypoxic/hypercapnic respiratory failure requiring intubation.  ASSESSMENT / PLAN:  PULMONARY A: Acute on chronic hypoxic/hypercapnic respiratory failure. AECOPD. P:   Oxygen to keep SpO2 90 to 95% F/u CXR intermittently Continue brovana, pulmicort, spiriva Prn albuterol D/c solumedrol and change to prednisone 30 mg daily on 1/01  CARDIOVASCULAR A:  Acute on chronic diastolic CHF. Hx of SSS s/p PM, HTN, A fib on coumadin. Bradycardia. P:  Continue amiodarone, ASA, lipitor Holding lopressor since 12/30 Coumadin per pharmacy  RENAL A:   CKD stage III >> baseline creatinine 1.46 from August 2016. P:   Monitor renal fx, urine outpt  GASTROINTESTINAL A:   Nutrition. P:   Regular diet Protonix for SUP  HEMATOLOGIC A:  Anemia of critical illness, and chronic disease. P:  F/u CBC intermittently  INFECTIOUS A:   AECOPD >> PNA unlikely. P:   Day 5/5 of zithromax >> d/c after dose on 1/01  ENDOCRINE A:   Steroid induced hyperglycemia >> improved. P:   D/c SSI  NEUROLOGIC A:   Acute encephalopathy 2nd to respiratory failure >> resolved. Deconditioning. P:   PT/OT  Transfer to floor bed.  Chesley Mires, MD [facility] Pulmonary/Critical Care 03/01/2015, 7:02 AM Pager:  [phone] After 3pm call: [phone]

## 2015-03-01 NOTE — Progress Notes (Signed)
FPTS Social Note  Continuing to follow chart. Patient has been extubated. Last day of azithromycin. Anticipate transfer back to our service soon. Will be happy to pick up. Appreciate care provided by CCM.  Cordelia Poche, MD PGY-3, [facility] 03/01/2015, 9:36 AM

## 2015-03-01 NOTE — Progress Notes (Signed)
   Arrival Method: wheelchair Mental Orientation: alert & oriented x 4  Telemetry: not ordered  Assessment: completed  Skin: skin tear to left shin and right hand area; both covered by foam dressings  IV: right hand and right outer AC  Pain: pt denies  Tubes: O2 at 3L Safety Measures: Patient Handbook has been given [place] Orientation: Patient has been oriented to the unit, staff and to the room.     Kessler Kopinski [employer], RN [employer] Phone [phone]

## 2015-03-01 NOTE — Progress Notes (Signed)
[place] for Coumadin Indication: atrial fibrillation  Allergies  Allergen Reactions  . Baclofen Other (See Comments)    Confusion occurred with taking 3 at the same time.  . Codeine Phosphate Nausea Only  . Norco [Hydrocodone-Acetaminophen] Nausea And Vomiting  . Hydrochlorothiazide Other (See Comments)    Possible acute gouty arthritis of wrist  . Montelukast Sodium Other (See Comments)    Unspecified reaction.     Patient Measurements: Height: 5\' 5"  (165.1 cm) Weight: 159 lb 13.3 oz (72.5 kg) IBW/kg (Calculated) : 57  Vital Signs: Temp: 98.1 F (36.7 C) (01/01 0826) Temp Source: Oral (01/01 0826) BP: 129/59 mmHg (01/01 0800) Pulse Rate: 59 (01/01 0800)  Labs:  Recent Labs  02/26/15 1151  02/26/15 1815 02/26/15 2359  02/27/15 0311 02/28/15 0357 03/01/15 0330  HGB  --   --   --   --   < > 9.8* 10.4* 10.4*  HCT  --   --   --   --   --  31.2* 32.7* 32.8*  PLT  --   --   --   --   --  207 224 226  LABPROT  --   --   --   --   --  30.4* 36.5* 34.0*  INR  --   --   --   --   --  2.98* 3.79* 3.45*  CREATININE 1.40*  < >  --   --   --  1.42* 1.58* 1.69*  TROPONINI <0.03  --  0.04* <0.03  --   --   --   --   < > = values in this interval not displayed.  Estimated Creatinine Clearance: 22.5 mL/min (by C-G formula based on Cr of 1.69).  Assessment: 82 y.o. F presented with dyspnea and cough. Pt on coumadin PTA for afib. INR 3.79 > 3.45, remains supratherapeutic. Hgb 10.4, plt 226K. No bleeding noted per chart. On D#5 azithromycin and home dose amiodarone.  Home dose: 2mg  daily except for 1.5mg  on Tues and Thur.   Goal of Therapy:  INR 2-3   Plan:  - Hold coumadin today - Daily INR  Maryanna Shape, PharmD, BCPS  Clinical Pharmacist  Pager: [phone]  03/01/2015 10:09 AM

## 2015-03-02 LAB — BASIC METABOLIC PANEL
ANION GAP: 10 (ref 5–15)
BUN: 66 mg/dL — ABNORMAL HIGH (ref 6–20)
CALCIUM: 8.6 mg/dL — AB (ref 8.9–10.3)
CHLORIDE: 103 mmol/L (ref 101–111)
CO2: 29 mmol/L (ref 22–32)
Creatinine, Ser: 1.65 mg/dL — ABNORMAL HIGH (ref 0.44–1.00)
GFR calc non Af Amer: 26 mL/min — ABNORMAL LOW (ref >60)
GFR, EST AFRICAN AMERICAN: 31 mL/min — AB (ref >60)
Glucose, Bld: 102 mg/dL — ABNORMAL HIGH (ref 65–99)
POTASSIUM: 4.1 mmol/L (ref 3.5–5.1)
Sodium: 142 mmol/L (ref 135–145)

## 2015-03-02 LAB — PROTIME-INR
INR: 3.53 — ABNORMAL HIGH (ref 0.00–1.49)
Prothrombin Time: 34.6 seconds — ABNORMAL HIGH (ref 11.6–15.2)

## 2015-03-02 MED ORDER — PREDNISONE 20 MG PO TABS
20.0000 mg | ORAL_TABLET | Freq: Every day | ORAL | Status: DC
  Administered 2015-03-03 – 2015-03-05 (×3): 20 mg via ORAL
  Filled 2015-03-02 (×4): qty 1

## 2015-03-02 MED ORDER — POLYETHYLENE GLYCOL 3350 17 G PO PACK
17.0000 g | PACK | Freq: Every day | ORAL | Status: DC
  Administered 2015-03-02 – 2015-03-06 (×5): 17 g via ORAL
  Filled 2015-03-02 (×5): qty 1

## 2015-03-02 MED ORDER — SENNA 8.6 MG PO TABS
2.0000 | ORAL_TABLET | Freq: Every day | ORAL | Status: DC
  Administered 2015-03-02 – 2015-03-03 (×2): 17.2 mg via ORAL
  Filled 2015-03-02 (×2): qty 2

## 2015-03-02 NOTE — Care Management Important Message (Signed)
Important Message  Patient Details  Name: [name] MRN: [id number] Date of Birth: 1923/12/03   Medicare Important Message Given:  Yes    Louanne Belton 03/02/2015, 1:09 [place] Message  Patient Details  Name: [name] MRN: [id number] Date of Birth: 01-14-1924   Medicare Important Message Given:  Yes    Karlin Heilman G 03/02/2015, 1:09 PM

## 2015-03-02 NOTE — Progress Notes (Signed)
Family Medicine Teaching Service Daily Progress Note Intern Pager: [phone]  Patient name: [name] Medical record number: [id number] Date of birth: May 02, 1923 Age: 82 y.o. Gender: female  Primary Care Provider: MCDIARMID,TODD D, MD Consultants: None  Code Status: Full   Assessment and Plan: [name] is a 82 yo female former smoker presented with progressive dyspnea from AECOPD and acute on chronic diastolic CHF. Developed hypoxic/hypercapnic respiratory failure requiring intubation, is now extubated. PMH is significant for COPD, AFib, HTN, COPD, CKD Stage III, sick sinus syndrome w/ pacemaker, and CAD s/p PCI.   Acute Exacerbation of COPD and hypercapnic respiratory failure: Pt was  intubated on 12/29 per CCM, extubated on 12/31. Transferred to SDU on 1/1. 5 days of Azithro complete (Last dose given on 1/1). CXR on 1/1: Pulmonary vascular prominence most notable centrally with slight improved aeration right lung base. Left base subsegmental atelectasis. Currently has O2 demand of 4 L [place].  - Goal SpO2 90 to 95% - F/u CXR intermittently - Continue brovana, pulmicort, spiriva - Prn albuterol, has not needed any in last 24 hours - prednisone 30 mg daily starting on 1/01 - Incentive spirometry - PT/OT  - Pulmonology consulted, appreciate their assistance  Acute on Chronic CHF: 12/29 Echo >> EF 55 to 123456, grade 2 diastolic dysfx, mild MR. No currently fluid overloaded. I/O:Urine output 700 cc over last 24 hrs. Still no stool since admission - Strict I/O  HTN:  Metoprolol XL 50 mg daily at home.  - Continuing holding at home anti hypertensives  H/o AFib: Patient not in Afib on EKG in ED. On long term coumadin for Afib. INR 2.21 at admission.  -continue amiodarone 200 mg daily, holding metoprolol XL 50 mg daily  -coumadin per pharmacy -monitor INR   CKD Stage III: Solitary kidney. Patient has h/o transitional cell cancer. Baseline Cr ~1.3. Stable with Cr of 1.35 at admission. Cr  1.65 today -BMET in AM to monitor Cr   CAD s/p PCI with h/o MI -continue atorvastatin 40 mg daily  -patient with two statins on home med list; will look into this further   Anxiety: Stable.  -Holding home Ativan 0.5 mg q6h prn   Osteoarthritis:  -Holding home Tramadol 50 mg q6h prn   H/o Sick Sinus Syndrome: Pacemaker in place.  FEN/GI: SLIV, regular diet  PPx: Coumadin per pharmacy  Disposition: home when able to wean O2  Subjective:  -Patient doing well this morning, very bright and talkative - Only complains of some right sided abdominal pain, cannot remember the last time she had a bowel movement - Denies nausea/vomiting/diarrhea/shortness of breath/chest pain - Good appetite   Objective: Temp:  [97.6 F (36.4 C)-98 F (36.7 C)] 97.6 F (36.4 C) (01/02 0439) Pulse Rate:  [61-66] 61 (01/02 0439) Resp:  [16-18] 18 (01/02 0439) BP: (114-144)/(50-82) 114/82 mmHg (01/02 0439) SpO2:  [95 %-99 %] 97 % (01/02 0439)   Physical Exam: General: NAD, pleasant Cardiovascular: RRR, normal s1 s2, no murmurs    Respiratory: no increased work of breathing, faint expiratory wheezing bilaterally  Abdomen: soft, non-tender, non-distended, no masses palpated, hypoactive bowel sounds Extremities: no edema  Laboratory:  Recent Labs Lab 02/27/15 0311 02/28/15 0357 03/01/15 0330  WBC 9.9 9.9 8.1  HGB 9.8* 10.4* 10.4*  HCT 31.2* 32.7* 32.8*  PLT 207 224 226    Recent Labs Lab 02/25/15 0050  02/27/15 0311 02/28/15 0357 03/01/15 0330  NA 143  < > 142 143 142  K 3.6  < >  4.3 4.1 4.0  CL 108  < > 105 103 104  CO2 25  < > 26 29 28   BUN 20  < > 37* 58* 64*  CREATININE 1.35*  < > 1.42* 1.58* 1.69*  CALCIUM 8.5*  < > 8.3* 8.4* 8.5*  PROT 5.6*  --   --   --   --   BILITOT 0.4  --   --   --   --   ALKPHOS 67  --   --   --   --   ALT 26  --   --   --   --   AST 31  --   --   --   --   GLUCOSE 110*  < > 153* 134* 159*  < > = values in this interval not  displayed.   Imaging/Diagnostic Tests: No results found.   Carlyle Dolly, MD 03/02/2015, 8:30 AM PGY-1, [place] Intern pager: [phone], text pages welcome

## 2015-03-02 NOTE — Evaluation (Signed)
Occupational Therapy Evaluation Patient Details Name: [name] MRN: [id number] DOB: 1923/07/03 Today's Date: 03/02/2015    History of Present Illness 82 yo female admitted with acute on chronic hypoxic / respiratory failure.PMH CKD III COPD, afib, htn, sick sinus syndrome with pacemaker CAD s/p PCI   Clinical Impression   PT admitted with respiratory failure. Pt currently with functional limitiations due to the deficits listed below (see OT problem list). PTA living independently driving and shopping. Pt has daughter that lives within the home but daughter is a flight attendant so gone frequently. Pt will benefit from skilled OT to increase their independence and safety with adls and balance to allow discharge HHOT / 3n1 to elevate toilet seat.     Follow Up Recommendations  Home health OT    Equipment Recommendations  3 in 1 bedside comode    Recommendations for Other Services       Precautions / Restrictions Precautions Precautions: Fall Precaution Comments: OXYGEN      Mobility Bed Mobility Overal bed mobility: Modified Independent             General bed mobility comments: incr time to use bed rail  Transfers Overall transfer level: Needs assistance Equipment used: 1 person hand held assist Transfers: Sit to/from Stand Sit to Stand: Min assist         General transfer comment: pt with incr time able to ambulate and required bil Ue for support. Pt reaching for environmental supports    Balance Overall balance assessment: Needs assistance Sitting-balance support: No upper extremity supported;Feet supported Sitting balance-Leahy Scale: Good     Standing balance support: Single extremity supported;During functional activity Standing balance-Leahy Scale: Fair                              ADL Overall ADL's : Needs assistance/impaired     Grooming: Wash/dry face;Oral care;Min guard;Standing Grooming Details (indicate cue type and reason):  LEANING WITH bil Ue on sink surface for support                 Toilet Transfer: Minimal assistance;Ambulation;Regular Toilet;Grab bars Toilet Transfer Details (indicate cue type and reason): pt requires use of grab bar due to low surface height Toileting- Clothing Manipulation and Hygiene: Min guard       Functional mobility during ADLs: Minimal assistance General ADL Comments: Pt provided RW at the end of OT session with PT for balance during ambulation. (See PT note) Pt noted to have oxygen level 88% on RA in bathroom. pt requires oxygen during adls.      Vision Vision Assessment?: No apparent visual deficits   Perception     Praxis      Pertinent Vitals/Pain Pain Assessment: Faces Faces Pain Scale: Hurts little more Pain Location: R flank pain Pain Descriptors / Indicators: Constant Pain Intervention(s): Repositioned;Monitored during session     Hand Dominance Right   Extremity/Trunk Assessment Upper Extremity Assessment Upper Extremity Assessment: Overall WFL for tasks assessed   Lower Extremity Assessment Lower Extremity Assessment: Defer to PT evaluation   Cervical / Trunk Assessment Cervical / Trunk Assessment: Normal   Communication Communication Communication: No difficulties   Cognition Arousal/Alertness: Awake/alert Behavior During Therapy: WFL for tasks assessed/performed Overall Cognitive Status: Within Functional Limits for tasks assessed                     General Comments  Exercises       Shoulder Instructions      Home Living Family/patient expects to be discharged to:: Private residence (going to daughters house ) Living Arrangements: Children (daughter lives with patient ) Available Help at Discharge: Available PRN/intermittently;Friend(s) (daughter flight attendant) Type of Home: House Home Access: Stairs to enter     Home Layout: Multi-level                   Additional Comments: patient has 4  daughters and a friend from church that can help. going to stay at daughter house ( daughters husband passed this year with ALS so home is accessible) patient can stay on main floor . pt drives and does community shopping at baseline.       Prior Functioning/Environment Level of Independence: Independent             OT Diagnosis: Generalized weakness   OT Problem List: Decreased strength;Decreased activity tolerance;Impaired balance (sitting and/or standing);Decreased safety awareness;Decreased knowledge of use of DME or AE;Decreased knowledge of precautions;Pain;Cardiopulmonary status limiting activity   OT Treatment/Interventions: Self-care/ADL training;Therapeutic exercise;DME and/or AE instruction;Energy conservation;Therapeutic activities;Patient/family education;Balance training    OT Goals(Current goals can be found in the care plan section) Acute Rehab OT Goals Patient Stated Goal: to play with grandson OT Goal Formulation: With patient Time For Goal Achievement: 03/16/15 Potential to Achieve Goals: Good  OT Frequency: Min 2X/week   Barriers to D/C:            Co-evaluation PT/OT/SLP Co-Evaluation/Treatment: Yes Reason for Co-Treatment: For patient/therapist safety (pt reports I dont feel steady and i need help)   OT goals addressed during session: ADL's and self-care      End of Session Equipment Utilized During Treatment: Gait belt;Rolling walker;Oxygen Nurse Communication: Mobility status;Precautions  Activity Tolerance: Patient tolerated treatment well Patient left: Other (comment) (with PT Ashly)   Time: [id number] OT Time Calculation (min): 28 min moderate ( level 2 eval) Charges:  OT General Charges $OT Visit: 1 Procedure OT Evaluation $Initial OT Evaluation Tier I: 1 Procedure (moderate eval) G-Codes:    Parke Poisson B 2015-03-24, 11:01 AM  Jeri Modena   OTR/L Pager: [phone] Office: [phone] .

## 2015-03-02 NOTE — Progress Notes (Signed)
[place] for Coumadin Indication: atrial fibrillation  Allergies  Allergen Reactions  . Baclofen Other (See Comments)    Confusion occurred with taking 3 at the same time.  . Codeine Phosphate Nausea Only  . Norco [Hydrocodone-Acetaminophen] Nausea And Vomiting  . Hydrochlorothiazide Other (See Comments)    Possible acute gouty arthritis of wrist  . Montelukast Sodium Other (See Comments)    Unspecified reaction.     Patient Measurements: Height: 5\' 5"  (165.1 cm) Weight: 159 lb 13.3 oz (72.5 kg) IBW/kg (Calculated) : 57  Vital Signs: Temp: 97.8 F (36.6 C) (01/02 1040) Temp Source: Oral (01/02 0439) BP: 110/62 mmHg (01/02 1040) Pulse Rate: 62 (01/02 1040)  Labs:  Recent Labs  02/28/15 0357 03/01/15 0330 03/02/15 0501 03/02/15 0905  HGB 10.4* 10.4*  --   --   HCT 32.7* 32.8*  --   --   PLT 224 226  --   --   LABPROT 36.5* 34.0* 34.6*  --   INR 3.79* 3.45* 3.53*  --   CREATININE 1.58* 1.69*  --  1.65*    Estimated Creatinine Clearance: 23.1 mL/min (by C-G formula based on Cr of 1.65).  Assessment: Anticoagulation: Coumadin PTA for afib. INR 3.53 supratherapeutic. No bleeding noted per chart.  --Home dose: 2mg  daily except for 1.5mg  on Tues and Thur.  Goal of Therapy:  INR 2-3   Plan:  - Hold coumadin again today - Daily INR   Raijon Lindfors S. Alford Highland, PharmD, [facility] Clinical Staff Pharmacist Pager [phone]  03/02/2015 11:50 AM

## 2015-03-02 NOTE — Progress Notes (Signed)
PULMONARY / CRITICAL CARE MEDICINE   Name: [name] MRN: [id number] DOB: 27-Nov-1923    ADMISSION DATE:  02/25/2015 CONSULTATION DATE:  02/26/2015  REFERRING MD:  FPTS  CHIEF COMPLAINT:  Short of breath  SUBJECTIVE:  C/o pain in Rt side.  Hasn't had BM.  Still has cough, but breathing better.  VITAL SIGNS: BP 114/82 mmHg  Pulse 61  Temp(Src) 97.6 F (36.4 C) (Oral)  Resp 18  Ht 5\' 5"  (1.651 m)  Wt 159 lb 13.3 oz (72.5 kg)  BMI 26.60 kg/m2  SpO2 97%  INTAKE / OUTPUT: I/O last 3 completed shifts: In: 750 [P.O.:720; Other:30] Out: 1920 [id number]  PHYSICAL EXAMINATION: General:  pleasant Neuro: normal strength HEENT:  No stridor Cardiovascular:  Regular, no murmur Lungs: no wheeze Abdomen:  Soft, non tender Musculoskeletal:  No edema Skin:  No rashes  LABS:  BMET  Recent Labs Lab 02/27/15 0311 02/28/15 0357 03/01/15 0330  NA 142 143 142  K 4.3 4.1 4.0  CL 105 103 104  CO2 26 29 28   BUN 37* 58* 64*  CREATININE 1.42* 1.58* 1.69*  GLUCOSE 153* 134* 159*    Electrolytes  Recent Labs Lab 02/27/15 0311 02/28/15 0357 03/01/15 0330  CALCIUM 8.3* 8.4* 8.5*  MG  --  1.9  --   PHOS  --  4.2  --     CBC  Recent Labs Lab 02/27/15 0311 02/28/15 0357 03/01/15 0330  WBC 9.9 9.9 8.1  HGB 9.8* 10.4* 10.4*  HCT 31.2* 32.7* 32.8*  PLT 207 224 226    Coag's  Recent Labs Lab 02/28/15 0357 03/01/15 0330 03/02/15 0501  INR 3.79* 3.45* 3.53*    ABG  Recent Labs Lab 02/26/15 1155 02/26/15 1634  PHART 7.086* 7.331*  PCO2ART 86.7* 50.8*  PO2ART 99.5 111.0*    Liver Enzymes  Recent Labs Lab 02/25/15 0050  AST 31  ALT 26  ALKPHOS 67  BILITOT 0.4  ALBUMIN 3.2*    Cardiac Enzymes  Recent Labs Lab 02/26/15 1151 02/26/15 1815 02/26/15 2359  TROPONINI <0.03 0.04* <0.03    Glucose  Recent Labs Lab 02/28/15 0340 02/28/15 0907 02/28/15 1211 02/28/15 1623 02/28/15 1943 03/01/15 0823  GLUCAP 126* 134* 121* 128* 104*  130*    Imaging No results found.   STUDIES:  12/29 Echo >> EF 55 to 123456, grade 2 diastolic dysfx, mild MR  CULTURES:  ANTIBIOTICS: 12/28 Rocephin >> 12/30 12/28 Zithromax >> 1/01  SIGNIFICANT EVENTS: 12/28 Admit 12/29 Transfer to ICU 01/01 To floor, change to prednisone  LINES/TUBES: 12/29 ETT >> 12/31  DISCUSSION: 82 yo female former smoker presented with progressive dyspnea from AECOPD and acute on chronic diastolic CHF.  Developed hypoxic/hypercapnic respiratory failure requiring intubation.  ASSESSMENT / PLAN:  Acute on chronic hypoxic/hypercapnic respiratory failure. AECOPD >> completed Abx 1/01. P:   Oxygen to keep SpO2 90 to 95% F/u CXR intermittently Continue brovana, pulmicort, spiriva Prn albuterol Wean off prednisone as tolerated  Acute on chronic diastolic CHF. Hx of SSS s/p PM, HTN, A fib on coumadin. Bradycardia. P:  Continue amiodarone, ASA, lipitor Holding lopressor since 12/30 Coumadin per pharmacy  CKD stage III >> baseline creatinine 1.46 from August 2016. P:   Monitor renal fx, urine outpt  Constipation. P:   Regular diet Protonix for SUP Adjust bowel regime  Deconditioning. P:   PT/OT  Will ask FPTS to resume primary care.  PCCM will follow as pulmonary consultant.  Chesley Mires, MD [facility] Pulmonary/Critical Care 03/02/2015, 10:03  AM Pager:  [phone] After 3pm call: [phone]

## 2015-03-02 NOTE — Discharge Instructions (Addendum)
It has been a pleasure taking care of you! You were admitted due to shortness of breath, which was likely due to COPD and fluid in your lung. We have treated your with medications for both conditions.  We believe your symptoms have improved to the extent we think it is safe for you to go home and continue your medication. We are discharging you on more of this medication and other additional medications that you need to continue taking after you leave the hospital. We may have made some adjustments to your other medications. Please, make sure to read the directions before you take them. The names and directions on how to take these medications are found on this discharge paper under medication section.  You also need a follow up with your primary care doctor. The address, date and time are found on the discharge paper under follow up section.  Take care,  Information on my medicine - Coumadin   (Warfarin)  This medication education was reviewed with me or my healthcare representative as part of my discharge preparation.  The pharmacist that spoke with me during my hospital stay was:  Wayland Salinas, [facility]  Why was Coumadin prescribed for you? Coumadin was prescribed for you because you have a blood clot or a medical condition that can cause an increased risk of forming blood clots. Blood clots can cause serious health problems by blocking the flow of blood to the heart, lung, or brain. Coumadin can prevent harmful blood clots from forming. As a reminder your indication for Coumadin is:   Stroke Prevention Because Of Atrial Fibrillation  What test will check on my response to Coumadin? While on Coumadin (warfarin) you will need to have an INR test regularly to ensure that your dose is keeping you in the desired range. The INR (international normalized ratio) number is calculated from the result of the laboratory test called prothrombin time (PT).  If an INR APPOINTMENT HAS NOT ALREADY  BEEN MADE FOR YOU please schedule an appointment to have this lab work done by your health care provider within 7 days. Your INR goal is usually a number between:  2 to 3 or your provider may give you a more narrow range like 2-2.5.  Ask your health care provider during an office visit what your goal INR is.  What  do you need to  know  About  COUMADIN? Take Coumadin (warfarin) exactly as prescribed by your healthcare provider about the same time each day.  DO NOT stop taking without talking to the doctor who prescribed the medication.  Stopping without other blood clot prevention medication to take the place of Coumadin may increase your risk of developing a new clot or stroke.  Get refills before you run out.  What do you do if you miss a dose? If you miss a dose, take it as soon as you remember on the same day then continue your regularly scheduled regimen the next day.  Do not take two doses of Coumadin at the same time.  Important Safety Information A possible side effect of Coumadin (Warfarin) is an increased risk of bleeding. You should call your healthcare provider right away if you experience any of the following: ? Bleeding from an injury or your nose that does not stop. ? Unusual colored urine (red or dark brown) or unusual colored stools (red or black). ? Unusual bruising for unknown reasons. ? A serious fall or if you hit your head (even if there is  no bleeding).  Some foods or medicines interact with Coumadin (warfarin) and might alter your response to warfarin. To help avoid this: ? Eat a balanced diet, maintaining a consistent amount of Vitamin K. ? Notify your provider about major diet changes you plan to make. ? Avoid alcohol or limit your intake to 1 drink for women and 2 drinks for men per day. (1 drink is 5 oz. wine, 12 oz. beer, or 1.5 oz. liquor.)  Make sure that ANY health care provider who prescribes medication for you knows that you are taking Coumadin (warfarin).   Also make sure the healthcare provider who is monitoring your Coumadin knows when you have started a new medication including herbals and non-prescription products.  Coumadin (Warfarin)  Major Drug Interactions  Increased Warfarin Effect Decreased Warfarin Effect  Alcohol (large quantities) Antibiotics (esp. Septra/Bactrim, Flagyl, Cipro) Amiodarone (Cordarone) Aspirin (ASA) Cimetidine (Tagamet) Megestrol (Megace) NSAIDs (ibuprofen, naproxen, etc.) Piroxicam (Feldene) Propafenone (Rythmol SR) Propranolol (Inderal) Isoniazid (INH) Posaconazole (Noxafil) Barbiturates (Phenobarbital) Carbamazepine (Tegretol) Chlordiazepoxide (Librium) Cholestyramine (Questran) Griseofulvin Oral Contraceptives Rifampin Sucralfate (Carafate) Vitamin K   Coumadin (Warfarin) Major Herbal Interactions  Increased Warfarin Effect Decreased Warfarin Effect  Garlic Ginseng Ginkgo biloba Coenzyme Q10 Green tea St. Johns wort    Coumadin (Warfarin) FOOD Interactions  Eat a consistent number of servings per week of foods HIGH in Vitamin K (1 serving =  cup)  Collards (cooked, or boiled & drained) Kale (cooked, or boiled & drained) Mustard greens (cooked, or boiled & drained) Parsley *serving size only =  cup Spinach (cooked, or boiled & drained) Swiss chard (cooked, or boiled & drained) Turnip greens (cooked, or boiled & drained)  Eat a consistent number of servings per week of foods MEDIUM-HIGH in Vitamin K (1 serving = 1 cup)  Asparagus (cooked, or boiled & drained) Broccoli (cooked, boiled & drained, or raw & chopped) Brussel sprouts (cooked, or boiled & drained) *serving size only =  cup Lettuce, raw (green leaf, endive, romaine) Spinach, raw Turnip greens, raw & chopped   These websites have more information on Coumadin (warfarin):  FailFactory.se; VeganReport.com.au;

## 2015-03-02 NOTE — Evaluation (Signed)
Physical Therapy Evaluation Patient Details Name: [name] MRN: [id number] DOB: Nov 06, 1923 Today's Date: 03/02/2015   History of Present Illness  82 yo female admitted with acute on chronic hypoxic / respiratory failure.PMH CKD III COPD, afib, htn, sick sinus syndrome with pacemaker CAD s/p PCI  Clinical Impression  Pt was functioning indep and alone during the day PTA but now pt with + SOB requiring 4 LO2 via [place] and requiring assist for transfers and ambulation. Pt plans to move in with daughter who can provide 24/7 assist. Acute PT to con't to follow to progress indep with mobility.    Follow Up Recommendations Home health PT;Supervision/Assistance - 24 hour    Equipment Recommendations   (pt has rollator)    Recommendations for Other Services       Precautions / Restrictions Precautions Precautions: Fall Precaution Comments: OXYGEN Restrictions Weight Bearing Restrictions: No      Mobility  Bed Mobility Overal bed mobility: Modified Independent             General bed mobility comments: incr time to use bed rail  Transfers Overall transfer level: Needs assistance Equipment used: 1 person hand held assist Transfers: Sit to/from Stand Sit to Stand: Min assist         General transfer comment: pt with incr time able to ambulate and required bil Ue for support. Pt reaching for environmental supports  Ambulation/Gait Ambulation/Gait assistance: Min guard Ambulation Distance (Feet): 120 Feet Assistive device: Rolling walker (2 wheeled) Gait Pattern/deviations: Step-through pattern;Decreased stride length Gait velocity: decreased   General Gait Details: pt with + SOB requiring 1 standing rest break. Pt SpO2 at 88% on 2LO2 via [place]. v/c's for walker management/safety especially during turning.. pt amb to bathroom without RW however reached and held onto counter/door frames  Financial trader Rankin (Stroke Patients Only)       Balance Overall balance assessment: Needs assistance Sitting-balance support: No upper extremity supported Sitting balance-Leahy Scale: Good     Standing balance support: During functional activity;Single extremity supported Standing balance-Leahy Scale: Fair Standing balance comment: leaned on elbows to brush teeth                             Pertinent Vitals/Pain Pain Assessment: Faces Faces Pain Scale: Hurts little more Pain Location: R flank pain Pain Descriptors / Indicators: Constant Pain Intervention(s): Monitored during session    Home Living Family/patient expects to be discharged to:: Private residence (going to daughters house ) Living Arrangements: Children (daughter lives with patient ) Available Help at Discharge: Available PRN/intermittently;Friend(s) (daughter flight attendant) Type of Home: House Home Access: Stairs to enter   Technical brewer of Steps: 3 Home Layout: Multi-level Home Equipment: Environmental consultant - 4 wheels Additional Comments: patient has 4 daughters and a friend from church that can help. going to stay at daughter house ( daughters husband passed this year with ALS so home is accessible) patient can stay on main floor . pt drives and does community shopping at baseline. pt does have 3 steps to get into bed due to bed height at daughters house    Prior Function Level of Independence: Independent               Hand Dominance   Dominant Hand: Right    Extremity/Trunk Assessment   Upper Extremity Assessment: Overall WFL for tasks assessed  Lower Extremity Assessment: Generalized weakness      Cervical / Trunk Assessment: Normal;Kyphotic  Communication   Communication: No difficulties  Cognition Arousal/Alertness: Awake/alert Behavior During Therapy: WFL for tasks assessed/performed Overall Cognitive Status: Within Functional Limits for tasks assessed                      General Comments  General comments (skin integrity, edema, etc.): R LE with bandage, pt unsure what happenend    Exercises        Assessment/Plan    PT Assessment Patient needs continued PT services  PT Diagnosis Difficulty walking;Generalized weakness   PT Problem List Decreased strength;Decreased range of motion;Decreased activity tolerance;Decreased balance;Decreased mobility  PT Treatment Interventions DME instruction;Gait training;Stair training;Functional mobility training;Therapeutic activities;Therapeutic exercise   PT Goals (Current goals can be found in the Care Plan section) Acute Rehab PT Goals Patient Stated Goal: to play with grandson PT Goal Formulation: With patient Time For Goal Achievement: 03/16/15 Potential to Achieve Goals: Good    Frequency Min 3X/week   Barriers to discharge        Co-evaluation PT/OT/SLP Co-Evaluation/Treatment: Yes Reason for Co-Treatment: For patient/therapist safety PT goals addressed during session: Mobility/safety with mobility OT goals addressed during session: ADL's and self-care       End of Session Equipment Utilized During Treatment: Gait belt Activity Tolerance: Patient tolerated treatment well Patient left: in chair;with call bell/phone within reach (MD present) Nurse Communication: Mobility status (SpO2)         Time: [id number] PT Time Calculation (min) (ACUTE ONLY): 35 min   Charges:   PT Evaluation $Initial PT Evaluation Tier I: 1 Procedure     PT G CodesKingsley Callander 03/02/2015, 11:54 AM   Kittie Plater, PT, DPT Pager #: [phone] Office #: [phone]

## 2015-03-03 LAB — PROTIME-INR
INR: 3.03 — AB (ref 0.00–1.49)
Prothrombin Time: 30.9 seconds — ABNORMAL HIGH (ref 11.6–15.2)

## 2015-03-03 MED ORDER — WARFARIN SODIUM 1 MG PO TABS
1.5000 mg | ORAL_TABLET | Freq: Once | ORAL | Status: AC
  Administered 2015-03-03: 1.5 mg via ORAL
  Filled 2015-03-03: qty 1

## 2015-03-03 MED ORDER — ENSURE ENLIVE PO LIQD
237.0000 mL | Freq: Every day | ORAL | Status: DC
  Administered 2015-03-03: 237 mL via ORAL

## 2015-03-03 MED ORDER — SENNOSIDES-DOCUSATE SODIUM 8.6-50 MG PO TABS
2.0000 | ORAL_TABLET | Freq: Every day | ORAL | Status: DC
  Administered 2015-03-03 – 2015-03-05 (×3): 2 via ORAL
  Filled 2015-03-03 (×3): qty 2

## 2015-03-03 NOTE — Progress Notes (Signed)
Nutrition Follow-up  DOCUMENTATION CODES:   Not applicable  INTERVENTION:  Provide Ensure Enlive po once daily, each supplement provides 350 kcal and 20 grams of protein.  Encourage adequate PO intake.   NUTRITION DIAGNOSIS:   Inadequate oral intake related to inability to eat as evidenced by NPO status; diet advanced; varied meal completion 40-100%.  GOAL:   Patient will meet greater than or equal to 90% of their needs; progressing  MONITOR:   PO intake, Supplement acceptance, Weight trends, Labs, I & O's  REASON FOR ASSESSMENT:   Consult Assessment of nutrition requirement/status  ASSESSMENT:   82 y/o Female admitted on 12/28 with a chief complaint of dyspnea. She was admitted to the [place] with a presumed diagnosis of COPD exacerbation. She was also treated for pneumonia with IVF and IV antibiotics. She apparently initially improved but after 24 hours her dyspnea worsened and PCCM was consulted this morning for an abrupt change in her dyspnea and the development of acute hypoxemic respiratory failure. No further history could be obtained from the patient due to severe dyspnea.  Pt extubated 12/31.  Meal completion has been 40-100% with 50% intake at breakfast this AM. Pt reports appetite is fine currently and PTA at home with consumption of at least 3 meals a day. Pt with no weight loss per weight records. RD to order Ensure to aid in caloric and protein needs. Pt encouraged to eat her food at meals.   Limited nutrition-Focused physical exam completed. Pt with no observed significant fat or muscle mass loss.   Labs and medications reviewed.   Diet Order:  Diet regular Room service appropriate?: Yes; Fluid consistency:: Thin  Skin:  Reviewed, no issues  Last BM:  12/26  Height:   Ht Readings from Last 1 Encounters:  02/25/15 5\' 5"  (1.651 m)    Weight:   Wt Readings from Last 1 Encounters:  03/02/15 160 lb 11.5 oz (72.9 kg)    Ideal Body Weight:  56.8  kg  BMI:  Body mass index is 26.74 kg/(m^2).  Estimated Nutritional Needs:   Kcal:  1700-1900  Protein:  80-90 grams  Fluid:  1.7 - 1.9 L/day  EDUCATION NEEDS:   No education needs identified at this time  Corrin Parker, MS, RD, LDN Pager # [phone] After hours/ weekend pager # [phone]

## 2015-03-03 NOTE — Consult Note (Signed)
   [facility] CM Inpatient Consult   03/03/2015  [name] 04-09-1923 [id number]   Patient evaluated for [place] Management services. Went to bedside to discuss and offer [place] Management follow up. After detailed information and description given about [facility] Care Management program, patient states she rather discuss the program with her Primary Care MD personally before giving written consent to writer. Left brochure along with contact information for patient to call if she should become interested in the program. Appreciative of visit. Made inpatient RNCM aware patient declined [place] Management services.  Marthenia Rolling, MSN-Ed, RN,BSN [facility] Liaison [phone]

## 2015-03-03 NOTE — Care Management Note (Addendum)
Case Management Note  Patient Details  Name: [name] MRN: [id number] Date of Birth: [birth year]  Subjective/Objective:      CM following for progression and d/c planning.              Action/Plan: Ongoing efforts to obtain pt copay for Brovana and Pulmicort. Per CMA this pt insurance requires pre auth for Garlon Hatchet before a copay can be provided and pt copay for Pulmicort is $219.54.  NP Theadora Rama notified and attending resident notified. Await final pricing , Oxygen ordered and for delivery to room. Pt informed and she will d/c to her daughters home so she will provide that address to [facility] when they deliver her portable tank.   Expected Discharge Date:     03/04/2015             Expected Discharge Plan:  [place]  In-House Referral:  NA  Discharge planning Services  CM Consult  Post Acute Care Choice:  Durable Medical Equipment Choice offered to:  Patient  DME Arranged:  Oxygen DME Agency:  [place]:  RN [facility] Agency:  [place]  Status of Service:  In process, will continue to follow  Medicare Important Message Given:  Yes Date Medicare IM Given:    Medicare IM give by:    Date Additional Medicare IM Given:    Additional Medicare Important Message give by:     If discussed at [place] of Stay Meetings, dates discussed:    Additional Comments:  Adron Bene, RN 03/03/2015, 3:34 PM

## 2015-03-03 NOTE — Progress Notes (Signed)
SATURATION QUALIFICATIONS: (This note is used to comply with regulatory documentation for home oxygen)  Patient Saturations on Room Air at Rest = 95-100%  Patient Saturations on Room Air while Ambulating = 79%  Patient Saturations on 2 Liters of oxygen while Ambulating = 95-98%  Please briefly explain why patient needs home oxygen: Sats clearly drop with ambulation

## 2015-03-03 NOTE — Progress Notes (Signed)
ANTICOAGULATION CONSULT NOTE - Follow Up Consult  Pharmacy Consult for Coumadin Indication: atrial fibrillation  Allergies  Allergen Reactions  . Baclofen Other (See Comments)    Confusion occurred with taking 3 at the same time.  . Codeine Phosphate Nausea Only  . Norco [Hydrocodone-Acetaminophen] Nausea And Vomiting  . Hydrochlorothiazide Other (See Comments)    Possible acute gouty arthritis of wrist  . Montelukast Sodium Other (See Comments)    Unspecified reaction.     Patient Measurements: Height: 5\' 5"  (165.1 cm) Weight: 160 lb 11.5 oz (72.9 kg) IBW/kg (Calculated) : 57  Vital Signs: Temp: 97.8 F (36.6 C) (01/03 1000) Temp Source: Oral (01/03 1000) BP: 111/57 mmHg (01/03 1000) Pulse Rate: 59 (01/03 1000)  Labs:  Recent Labs  03/01/15 0330 03/02/15 0501 03/02/15 0905 03/03/15 0530  HGB 10.4*  --   --   --   HCT 32.8*  --   --   --   PLT 226  --   --   --   LABPROT 34.0* 34.6*  --  30.9*  INR 3.45* 3.53*  --  3.03*  CREATININE 1.69*  --  1.65*  --     Estimated Creatinine Clearance: 23.1 mL/min (by C-G formula based on Cr of 1.65).  Assessment:   INR is down to 3.03 after holding Coumadin x 3 days. Expect some further drop in INR by am.   Home regimen:  2 mg daily except 1.5 mg on Tuesdays and Thursdays.  Goal of Therapy:  INR 2-3 Monitor platelets by anticoagulation protocol: Yes   Plan:   Resume Coumadin with 1.5 mg today, usual Tuesday dose.  Hope to resume home regimen on 1/4.  Daily PT/INR.  Arty Baumgartner, [place] Pager: [phone] 03/03/2015,12:05 PM

## 2015-03-03 NOTE — Progress Notes (Signed)
Family Medicine Teaching Service Daily Progress Note Intern Pager: [phone]  Patient name: [name] Medical record number: [id number] Date of birth: [birth year] Age: 82 y.o. Gender: female  Primary Care Provider: MCDIARMID,TODD D, MD Consultants: None  Code Status: Full   Assessment and Plan: [name] is a 82 yo female former smoker presented with progressive dyspnea from AECOPD and acute on chronic diastolic CHF. Developed hypoxic/hypercapnic respiratory failure requiring intubation, is now extubated. PMH is significant for COPD, AFib, HTN, COPD, CKD Stage III, sick sinus syndrome w/ pacemaker, and CAD s/p PCI.   Acute Exacerbation of COPD and hypercapnic respiratory failure: Pt was  intubated on 12/29 per CCM, extubated on 12/31. Transferred to SDU on 1/1. 5 days of Azithro complete (Last dose given on 1/1). Currently sating at upper 90's to 100% on 3 L [place]. - Goal SpO2 > 92%. Wean O2 as tolerated - F/u CXR intermittently - Continue brovana, pulmicort, spiriva - hasn't needed rescue meds  - prednisone 30 mg daily starting on 1/01 - Incentive spirometry - PT/OT  - Pulmonology consulted, appreciate their assistance - ambulate for oxygen requirment  Acute on Chronic CHF: patient with acute resp failure leading to intubation on 12/29. She was diuresed with IV lasix  (last 12/30). She was extubated on 01/01 and transferred to floor.  Echo on 12/29 with EF 55 to [id number], grade 2 diastolic dysfx, mild MR. CXR on 1/1: Pulmonary vascular prominence most notable centrally with slight improved aeration right lung base. Left base subsegmental atelectasis. No currently fluid overloaded.  - Strict I/O. This is not being charted right now.  HTN: BP within normal range. Metoprolol XL 50 mg daily at home.  - Continuing holding at home anti hypertensives  H/o AFib: Patient not in Afib on EKG in ED. On long term coumadin for Afib.  - INR 2.21 at admission, up to 3.8. Holding warfarin. INR 3.03 this  AM -continue amiodarone 200 mg daily, holding metoprolol XL 50 mg daily  -monitor INR   CKD Stage III: Solitary kidney. Patient has h/o transitional cell cancer. Baseline Cr ~1.3. Stable with Cr of 1.35 at admission. Last Cr 1.65 -BMET in AM to monitor Cr   CAD s/p PCI with h/o MI -continue atorvastatin 40 mg daily  -patient with two statins on home med list; will look into this further   Anxiety: Stable.  -Holding home Ativan 0.5 mg q6h prn   Osteoarthritis:  -Holding home Tramadol 50 mg q6h prn   H/o Sick Sinus Syndrome: Pacemaker in place.  FEN/GI: SLIV, regular diet  -Miralax daily -Enema once today  PPx: Coumadin per pharmacy  Disposition: home pending respiratory improvement  Subjective:  Reports that her breathing is better. She still on oxygen. She is not on oxygen at home. Denies chest pain. Didn't have bowel movement in 4 days.   Objective: Temp:  [97.8 F (36.6 C)-98.1 F (36.7 C)] 97.8 F (36.6 C) (01/03 0500) Pulse Rate:  [60-67] 60 (01/03 0500) Resp:  [16-18] 18 (01/03 0500) BP: (110-140)/(58-86) 140/64 mmHg (01/03 0500) SpO2:  [91 %-100 %] 100 % (01/03 0500) Weight:  [160 lb 11.5 oz (72.9 kg)] 160 lb 11.5 oz (72.9 kg) (01/02 2100)   Physical Exam: Gen: appears well, no acute distress, able to sit up in bed for exam Oropharynx: clear, moist CV: regular rate and rythm. S1 & S2 audible Resp: on 3L by [place], no apparent work of breathing, clear to auscultation bilaterally. GI: bowel sounds normal, no tenderness  to palpation   Laboratory:  Recent Labs Lab 02/27/15 0311 02/28/15 0357 03/01/15 0330  WBC 9.9 9.9 8.1  HGB 9.8* 10.4* 10.4*  HCT 31.2* 32.7* 32.8*  PLT 207 224 226    Recent Labs Lab 02/25/15 0050  02/28/15 0357 03/01/15 0330 03/02/15 0905  NA 143  < > 143 142 142  K 3.6  < > 4.1 4.0 4.1  CL 108  < > 103 104 103  CO2 25  < > 29 28 29   BUN 20  < > 58* 64* 66*  CREATININE 1.35*  < > 1.58* 1.69* 1.65*  CALCIUM 8.5*  < > 8.4*  8.5* 8.6*  PROT 5.6*  --   --   --   --   BILITOT 0.4  --   --   --   --   ALKPHOS 67  --   --   --   --   ALT 26  --   --   --   --   AST 31  --   --   --   --   GLUCOSE 110*  < > 134* 159* 102*  < > = values in this interval not displayed.   Imaging/Diagnostic Tests: No results found.   Mercy Riding, MD 03/03/2015, 8:22 AM PGY-1, [place] Intern pager: [phone], text pages welcome

## 2015-03-03 NOTE — Progress Notes (Signed)
PULMONARY / CRITICAL CARE MEDICINE   Name: [name] MRN: [id number] DOB: 05-26-1923    ADMISSION DATE:  02/25/2015 CONSULTATION DATE:  02/26/2015  REFERRING MD:  FPTS  CHIEF COMPLAINT:  Short of breath  SUBJECTIVE: Pt continues to report SOB but nearing baseline.  Notes moist cough.  RT reports pt has not been receiving Spiriva.  Pt concerned about cost of Spiriva + Advair as she quickly gets into the donut hole and pays 300$ per month for those two medications alone.     VITAL SIGNS: BP 140/64 mmHg  Pulse 60  Temp(Src) 97.8 F (36.6 C) (Oral)  Resp 18  Ht 5\' 5"  (1.651 m)  Wt 160 lb 11.5 oz (72.9 kg)  BMI 26.74 kg/m2  SpO2 100%  INTAKE / OUTPUT: I/O last 3 completed shifts: In: 1080 [P.O.:1080] Out: 700 [Urine:700]  PHYSICAL EXAMINATION: General:  Pleasant elderly female in NAD Neuro: normal strength, AAOx4 HEENT:  No stridor Cardiovascular:  Regular, no murmur Lungs: even/non-labored, lungs bilaterally posterior with rhonchi, few basilar crackles Abdomen:  Soft, non tender Musculoskeletal:  No edema Skin:  No rashes  LABS:  BMET  Recent Labs Lab 02/28/15 0357 03/01/15 0330 03/02/15 0905  NA 143 142 142  K 4.1 4.0 4.1  CL 103 104 103  CO2 29 28 29   BUN 58* 64* 66*  CREATININE 1.58* 1.69* 1.65*  GLUCOSE 134* 159* 102*    Electrolytes  Recent Labs Lab 02/28/15 0357 03/01/15 0330 03/02/15 0905  CALCIUM 8.4* 8.5* 8.6*  MG 1.9  --   --   PHOS 4.2  --   --     CBC  Recent Labs Lab 02/27/15 0311 02/28/15 0357 03/01/15 0330  WBC 9.9 9.9 8.1  HGB 9.8* 10.4* 10.4*  HCT 31.2* 32.7* 32.8*  PLT 207 224 226    Coag's  Recent Labs Lab 03/01/15 0330 03/02/15 0501 03/03/15 0530  INR 3.45* 3.53* 3.03*    ABG  Recent Labs Lab 02/26/15 1155 02/26/15 1634  PHART 7.086* 7.331*  PCO2ART 86.7* 50.8*  PO2ART 99.5 111.0*    Liver Enzymes  Recent Labs Lab 02/25/15 0050  AST 31  ALT 26  ALKPHOS 67  BILITOT 0.4  ALBUMIN 3.2*     Cardiac Enzymes  Recent Labs Lab 02/26/15 1151 02/26/15 1815 02/26/15 2359  TROPONINI <0.03 0.04* <0.03    Glucose  Recent Labs Lab 02/28/15 0340 02/28/15 0907 02/28/15 1211 02/28/15 1623 02/28/15 1943 03/01/15 0823  GLUCAP 126* 134* 121* 128* 104* 130*    Imaging No results found.   STUDIES:  12/29 Echo >> EF 55 to 123456, grade 2 diastolic dysfx, mild MR  CULTURES:  ANTIBIOTICS: 12/28 Rocephin >> 12/30 12/28 Zithromax >> 1/01  SIGNIFICANT EVENTS: 12/28 Admit 12/29 Transfer to ICU 01/01 To floor, change to prednisone 01/02  FPTS resumed primary care, PCCM consulting  LINES/TUBES: 12/29 ETT >> 12/31  DISCUSSION: 82 y/o female former smoker presented with progressive dyspnea from AECOPD and acute on chronic diastolic CHF.  Developed hypoxic/hypercapnic respiratory failure requiring intubation.  ASSESSMENT / PLAN:  Acute on chronic hypoxic/hypercapnic respiratory failure. AECOPD >> completed Abx 1/01. P:   Wean oxygen to keep SpO2 90 to 95%, goal to wean off Ambulatory assessment of O2 needs prior to discharge Will assess with care management to determine cost of brovana + pulmicort for home regimen vs Advair.  Another option would be to use scheduled duonebs as they are covered by Medicare.   F/u CXR intermittently  Continue brovana,  pulmicort, spiriva PRN albuterol Wean off prednisone as tolerated >> 20 mg QD  Acute on chronic diastolic CHF. Hx of SSS s/p PM, HTN, A Fib on coumadin. Bradycardia. P:  Continue amiodarone, ASA, lipitor Holding lopressor since 12/30 Coumadin per pharmacy  CKD stage III >> baseline creatinine 1.46 from August 2016. P:   Monitor renal fx, urine outpt  Constipation. P:   Regular diet Protonix for SUP Adjust bowel regime  Deconditioning. P:   PT/OT  PCCM will continue to follow as pulmonary consultant.  She will need Pulmonary follow up post discharge.  She is deciding on who she would like to see in the  office (Dr. Lamonte Sakai is her neighbor and she may want to see him).      Noe Gens, NP-C [facility] Pgr: [phone] or if no answer [phone] 03/03/2015, 8:39 AM

## 2015-03-03 NOTE — Progress Notes (Signed)
Pt finally had bowel movement following enema.

## 2015-03-04 ENCOUNTER — Inpatient Hospital Stay (HOSPITAL_COMMUNITY): Payer: Medicare Other

## 2015-03-04 LAB — BASIC METABOLIC PANEL
Anion gap: 7 (ref 5–15)
BUN: 50 mg/dL — AB (ref 6–20)
CHLORIDE: 106 mmol/L (ref 101–111)
CO2: 30 mmol/L (ref 22–32)
CREATININE: 1.54 mg/dL — AB (ref 0.44–1.00)
Calcium: 8.8 mg/dL — ABNORMAL LOW (ref 8.9–10.3)
GFR calc Af Amer: 33 mL/min — ABNORMAL LOW (ref >60)
GFR, EST NON AFRICAN AMERICAN: 29 mL/min — AB (ref >60)
Glucose, Bld: 130 mg/dL — ABNORMAL HIGH (ref 65–99)
Potassium: 4.3 mmol/L (ref 3.5–5.1)
SODIUM: 143 mmol/L (ref 135–145)

## 2015-03-04 LAB — PROTIME-INR
INR: 1.88 — AB (ref 0.00–1.49)
PROTHROMBIN TIME: 21.6 s — AB (ref 11.6–15.2)

## 2015-03-04 MED ORDER — IPRATROPIUM-ALBUTEROL 0.5-2.5 (3) MG/3ML IN SOLN
3.0000 mL | RESPIRATORY_TRACT | Status: DC
  Administered 2015-03-04 – 2015-03-05 (×5): 3 mL via RESPIRATORY_TRACT
  Filled 2015-03-04 (×4): qty 3

## 2015-03-04 MED ORDER — WARFARIN SODIUM 2.5 MG PO TABS
2.5000 mg | ORAL_TABLET | Freq: Once | ORAL | Status: AC
  Administered 2015-03-04: 2.5 mg via ORAL
  Filled 2015-03-04: qty 1

## 2015-03-04 MED ORDER — ALBUTEROL SULFATE (2.5 MG/3ML) 0.083% IN NEBU
3.0000 mL | INHALATION_SOLUTION | RESPIRATORY_TRACT | Status: DC
  Administered 2015-03-04 (×2): 3 mL via RESPIRATORY_TRACT
  Filled 2015-03-04 (×2): qty 3

## 2015-03-04 NOTE — Progress Notes (Signed)
Family Medicine Teaching Service Daily Progress Note Intern Pager: [phone]  Patient name: [name] Medical record number: [id number] Date of birth: [birth year] Age: 82 y.o. Gender: female  Primary Care Provider: MCDIARMID,TODD D, MD Consultants: None  Code Status: Full   Assessment and Plan: [name] is a 82 yo female former smoker presented with progressive dyspnea from AECOPD and acute on chronic diastolic CHF. Developed hypoxic/hypercapnic respiratory failure requiring intubation, is now extubated. PMH is significant for COPD, AFib, HTN, COPD, CKD Stage III, sick sinus syndrome w/ pacemaker, and CAD s/p PCI.   Acute Exacerbation of COPD and hypercapnic respiratory failure: Pt was  intubated on 12/29 per CCM, extubated on 12/31. Transferred to SDU on 1/1. 5 days of Azithro complete (Last dose given on 1/1). Currently sating at upper 90's to 100% on 3 L [name]. CXR this AM: stable COPD - Goal SpO2 > 92%. Wean O2 as tolerated - Continue pulmicort, spiriva, and brovana while inpatient. spiriva and advair at discharge - hasn't needed rescue meds  - s/p prednisone 30 mg daily 1/01 - 1/02. Prednisone 20 mg daily started on 1/02.  - Incentive spirometry - Expected DC plan: Home with home health services - Ambulate for oxygen requirment  Acute on Chronic CHF: patient with acute resp failure leading to intubation on 12/29. She was diuresed with IV lasix  (last 12/30). She was extubated on 01/01 and transferred to floor.  Echo on 12/29 with EF 55 to 123456, grade 2 diastolic dysfx, mild MR. CXR on 1/1: Pulmonary vascular prominence most notable centrally with slight improved aeration right lung base. Left base subsegmental atelectasis. No currently fluid overloaded on exam.  - Strict I/O; Up 410 cc since admission.  HTN: BP within normal range. Metoprolol XL 50 mg daily at home.  - Continuing holding at home anti hypertensives  H/o AFib: Patient not in Afib on EKG in ED. On long term coumadin for  Afib.  - INR 2.21 at admission, up to 3.8. INR 1.88 this AM -continue amiodarone 200 mg daily, consider changing this medication with PCP as Amiodarone has potential to cause pulmonary toxicity -continue holding metoprolol XL 50 mg daily  -Warfarin dosing per pharmacy  CKD Stage III: Solitary kidney. Patient has h/o transitional cell cancer. Baseline Cr ~1.3. Stable with Cr of 1.35 at admission. Last Cr 1.65 -BMET in AM to monitor Cr   CAD s/p PCI with h/o MI -continue atorvastatin 40 mg daily  -patient with two statins on home med list; will look into this further   Anxiety: Stable.  -Holding home Ativan 0.5 mg q6h prn   Osteoarthritis:  -Holding home Tramadol 50 mg q6h prn   H/o Sick Sinus Syndrome: Pacemaker in place.  FEN/GI: SLIV, regular diet  -Miralax daily  PPx: Coumadin per pharmacy  Disposition: home pending respiratory improvement  Subjective:  - Overnight patient remained on 3 L , O2 in high 90's - 4 BMs overnight and this morning, Senna was given - Abdominal pain resolving, pt believes uncomfortable bed is making it worse - States she can afford Spiriva and Advair at discharge  Objective: Temp:  [97.8 F (36.6 C)-98 F (36.7 C)] 98 F (36.7 C) (01/04 0443) Pulse Rate:  [59-64] 64 (01/04 0443) Resp:  [17-18] 17 (01/04 0443) BP: (111-136)/(45-61) 136/61 mmHg (01/04 0443) SpO2:  [94 %-100 %] 100 % (01/04 0443) Weight:  [159 lb 13.3 oz (72.5 kg)] 159 lb 13.3 oz (72.5 kg) (01/03 2155)   Physical Exam: Gen:  appears well, no acute distress, able to sit up in bed for exam and ambulate to bathroom Oropharynx: clear, moist CV: regular rate and rythm. S1 & S2 audible Resp: on 3L by [place], some shortness of breath after ambulating from bathroom but speaking in full sentences, clear to auscultation bilaterally. GI: bowel sounds normal, no tenderness to palpation   Laboratory:  Recent Labs Lab 02/27/15 0311 02/28/15 0357 03/01/15 0330  WBC 9.9 9.9 8.1   HGB 9.8* 10.4* 10.4*  HCT 31.2* 32.7* 32.8*  PLT 207 224 226    Recent Labs Lab 02/28/15 0357 03/01/15 0330 03/02/15 0905  NA 143 142 142  K 4.1 4.0 4.1  CL 103 104 103  CO2 29 28 29   BUN 58* 64* 66*  CREATININE 1.58* 1.69* 1.65*  CALCIUM 8.4* 8.5* 8.6*  GLUCOSE 134* 159* 102*     Imaging/Diagnostic Tests: No results found.   Tammy Dolly, MD 03/04/2015, 6:36 AM PGY-1, [name] Intern pager: [phone], text pages welcome

## 2015-03-04 NOTE — Progress Notes (Signed)
Occupational Therapy Treatment Patient Details Name: [name] MRN: [id number] DOB: Jan 14, 1924 Today's Date: 03/04/2015    History of present illness 82 yo female admitted with acute on chronic hypoxic / respiratory failure.PMH CKD III COPD, afib, htn, sick sinus syndrome with pacemaker CAD s/p PCI   OT comments  Pt very pleasant and asking multiple questions regarding EC techniques. Pt SOB with just talking this session. Pt completed bed level transfer. Pt reports "i am so glad i am not leaving today because I do not feel well"  Follow Up Recommendations  Home health OT    Equipment Recommendations  3 in 1 bedside comode    Recommendations for Other Services      Precautions / Restrictions Precautions Precautions: Fall Precaution Comments: OXYGEN Restrictions Weight Bearing Restrictions: No       Mobility Bed Mobility Overal bed mobility: Modified Independent             General bed mobility comments: increased time  Transfers Overall transfer level: Needs assistance Equipment used: 1 person hand held assist Transfers: Sit to/from Stand Sit to Stand: Min assist         General transfer comment: rw used     Balance                                   ADL Overall ADL's : Needs assistance/impaired                                       General ADL Comments: Pt educated on energy conservation and provided handout. Pt plans to use a seat and hand held shower head with luke warm water. Pt with SOB sitting eob with just talking. 4 L oxygen 93%. Pt verbalized feeling fatigued today.       Vision                     Perception     Praxis      Cognition   Behavior During Therapy: WFL for tasks assessed/performed Overall Cognitive Status: Within Functional Limits for tasks assessed                       Extremity/Trunk Assessment               Exercises     Shoulder Instructions       General  Comments      Pertinent Vitals/ Pain       Pain Assessment: Faces Faces Pain Scale: Hurts little more Pain Descriptors / Indicators: Constant Pain Intervention(s): Repositioned;Monitored during session  Home Living                                          Prior Functioning/Environment              Frequency Min 2X/week     Progress Toward Goals  OT Goals(current goals can now be found in the care plan section)  Progress towards OT goals: Progressing toward goals  Acute Rehab OT Goals Patient Stated Goal: to play with grandson OT Goal Formulation: With patient Time For Goal Achievement: 03/16/15 Potential to Achieve Goals: Good ADL Goals Pt Will Transfer to Toilet: with  supervision;ambulating;bedside commode Pt Will Perform Tub/Shower Transfer: Shower transfer;shower seat;ambulating;rolling walker Additional ADL Goal #1: Pt will verbalize 2 energy conservation techniques to use during adls  Plan Discharge plan remains appropriate    Co-evaluation                 End of Session Equipment Utilized During Treatment: Oxygen   Activity Tolerance Patient tolerated treatment well   Patient Left in bed;with call bell/phone within reach   Nurse Communication Mobility status;Precautions        Time: [id number] OT Time Calculation (min): 19 min  Charges: OT General Charges $OT Visit: 1 Procedure OT Treatments $Therapeutic Activity: 8-22 mins  Parke Poisson B 03/04/2015, 12:45 PM   Jeri Modena   OTR/L Pager: [phone] Office: [phone] .

## 2015-03-04 NOTE — Progress Notes (Signed)
ANTICOAGULATION CONSULT NOTE - Follow Up Consult  Pharmacy Consult for Coumadin Indication: atrial fibrillation  Allergies  Allergen Reactions  . Baclofen Other (See Comments)    Confusion occurred with taking 3 at the same time.  . Codeine Phosphate Nausea Only  . Norco [Hydrocodone-Acetaminophen] Nausea And Vomiting  . Hydrochlorothiazide Other (See Comments)    Possible acute gouty arthritis of wrist  . Montelukast Sodium Other (See Comments)    Unspecified reaction.     Patient Measurements: Height: 5\' 5"  (165.1 cm) Weight: 159 lb 13.3 oz (72.5 kg) IBW/kg (Calculated) : 57  Vital Signs: Temp: 97.8 F (36.6 C) (01/04 1025) Temp Source: Oral (01/04 1025) BP: 137/66 mmHg (01/04 1025) Pulse Rate: 61 (01/04 1025)  Labs:  Recent Labs  03/02/15 0501 03/02/15 0905 03/03/15 0530 03/04/15 0545 03/04/15 0935  LABPROT 34.6*  --  30.9* 21.6*  --   INR 3.53*  --  3.03* 1.88*  --   CREATININE  --  1.65*  --   --  1.54*    Estimated Creatinine Clearance: 24.7 mL/min (by C-G formula based on Cr of 1.54).  Assessment:   INR is down to 1.88 today. Bigger drop than expected after usual 1.5 mg dose on 1/3, after holding Coumadin x 3 days.   Home regimen:  2 mg daily except 1.5 mg on Tuesdays and Thursdays.  Goal of Therapy:  INR 2-3 Monitor platelets by anticoagulation protocol: Yes   Plan:   Coumadin 2.5 mg x 1 today (usual Wed dose is 2 mg)  Daily PT/INR while inpatient.  Arty Baumgartner, [place] Pager: [phone] 03/04/2015,12:10 PM

## 2015-03-04 NOTE — Progress Notes (Signed)
Physical Therapy Treatment Patient Details Name: MARYJAYNE BESTON MRN: [id number] DOB: 1923-11-06 Today's Date: 03/04/2015    History of Present Illness 82 yo female admitted with acute on chronic hypoxic / respiratory failure.PMH CKD III COPD, afib, htn, sick sinus syndrome with pacemaker CAD s/p PCI    PT Comments    Progressing towards functional goals. Ambulates on 3L up to 100 feet at 90% SpO2, required 4L supplemental O2 to ambulate additional 100 feet with increased dyspnea but 90% SpO2. Good use of RW for support. Cues for pursed lip breathing and energy conservation techniques. Patient will continue to benefit from skilled physical therapy services to further improve independence with functional mobility.   Follow Up Recommendations  Home health PT;Supervision/Assistance - 24 hour     Equipment Recommendations  None recommended by PT    Recommendations for Other Services       Precautions / Restrictions Precautions Precautions: Fall Precaution Comments: OXYGEN Restrictions Weight Bearing Restrictions: No    Mobility  Bed Mobility Overal bed mobility: Modified Independent             General bed mobility comments: increased time  Transfers Overall transfer level: Needs assistance Equipment used: Rolling walker (2 wheeled) Transfers: Sit to/from Stand Sit to Stand: Supervision         General transfer comment: supervision for safety. Performed from bed x2 and toilet with use of rail. VC for hand placement  Ambulation/Gait Ambulation/Gait assistance: Supervision Ambulation Distance (Feet): 200 Feet Assistive device: Rolling walker (2 wheeled) Gait Pattern/deviations: Decreased step length - right;Decreased stride length Gait velocity: decreased Gait velocity interpretation: Below normal speed for age/gender General Gait Details: Required 1 standing rest break to complete distance. VC for upright posture and walker placement for proximity. SpO2 on 3L down to  90% during 1st 100 feet of ambulation, 2nd 100 feet of ambulation SpO2 90% on 4L supplemental O2 with increased dyspnea. VC for pursed lip breathing and energy conservation techniques.   Stairs            Wheelchair Mobility    Modified Rankin (Stroke Patients Only)       Balance                                    Cognition Arousal/Alertness: Awake/alert Behavior During Therapy: WFL for tasks assessed/performed Overall Cognitive Status: Within Functional Limits for tasks assessed                      Exercises      General Comments General comments (skin integrity, edema, etc.): Resting SpO2 97% on 4L supplemental O2.      Pertinent Vitals/Pain Pain Assessment: No/denies pain Pain Intervention(s): Monitored during session    Home Living                      Prior Function            PT Goals (current goals can now be found in the care plan section) Acute Rehab PT Goals Patient Stated Goal: to play with grandson PT Goal Formulation: With patient Time For Goal Achievement: 03/16/15 Potential to Achieve Goals: Good Progress towards PT goals: Progressing toward goals    Frequency  Min 3X/week    PT Plan Current plan remains appropriate    Co-evaluation             End of  Session   Activity Tolerance: Patient tolerated treatment well Patient left: in chair;with call bell/phone within reach     Time: 1206-1227 PT Time Calculation (min) (ACUTE ONLY): 21 min  Charges:  $Gait Training: 8-22 mins                    G Codes:      Ellouise Newer 04-02-2015, 12:41 PM  Camille Bal [place], [place]

## 2015-03-05 ENCOUNTER — Telehealth: Payer: Self-pay | Admitting: *Deleted

## 2015-03-05 LAB — PROTIME-INR
INR: 1.72 — AB (ref 0.00–1.49)
Prothrombin Time: 20.2 seconds — ABNORMAL HIGH (ref 11.6–15.2)

## 2015-03-05 MED ORDER — WARFARIN SODIUM 2.5 MG PO TABS
2.5000 mg | ORAL_TABLET | Freq: Once | ORAL | Status: AC
  Administered 2015-03-05: 2.5 mg via ORAL
  Filled 2015-03-05: qty 1

## 2015-03-05 MED ORDER — IPRATROPIUM-ALBUTEROL 0.5-2.5 (3) MG/3ML IN SOLN: 3.0000 mL | RESPIRATORY_TRACT | Status: DC | PRN

## 2015-03-05 MED ORDER — IPRATROPIUM-ALBUTEROL 0.5-2.5 (3) MG/3ML IN SOLN
3.0000 mL | Freq: Three times a day (TID) | RESPIRATORY_TRACT | Status: DC
  Administered 2015-03-06: 3 mL via RESPIRATORY_TRACT
  Filled 2015-03-05: qty 3

## 2015-03-05 MED ORDER — FUROSEMIDE 10 MG/ML IJ SOLN
40.0000 mg | Freq: Every day | INTRAMUSCULAR | Status: DC
  Administered 2015-03-05 – 2015-03-06 (×2): 40 mg via INTRAVENOUS
  Filled 2015-03-05 (×2): qty 4

## 2015-03-05 MED ORDER — WARFARIN SODIUM 2.5 MG PO TABS: 2.5000 mg | ORAL_TABLET | Freq: Once | ORAL | Status: DC

## 2015-03-05 MED ORDER — FLUTICASONE-SALMETEROL 250-50 MCG/DOSE IN AEPB: 2.0000 | INHALATION_SPRAY | Freq: Two times a day (BID) | RESPIRATORY_TRACT | Status: DC

## 2015-03-05 NOTE — Care Management Note (Addendum)
Case Management Note  Patient Details  Name: [name] MRN: [id number] Date of Birth: [birth year]  Subjective/Objective:          CM following for progression and d/c planning.          Action/Plan: 03/04/2014 Met with pt re d/c plans, pt is hesitant to accept [facility] services stating that there are so many other people who have no help and may need the services more than her.  This CM asked pt to consider initial visits for evaluation and assessment once she is at home and that if [facility] is not needed she or her daughters may d/c the service. [facility] notified of [facility] orders for PT and RN services. Pt has home home oxygen ordered and a 3:1 have been ordered.  03/06/2015 Pt has agreed to be part of the EMMI study and [facility] notified.   Expected Discharge Date:   03/05/2015               Expected Discharge Plan:  [place]  In-House Referral:  NA  Discharge planning Services  CM Consult  Post Acute Care Choice:  Durable Medical Equipment Choice offered to:  Patient  DME Arranged:  Oxygen3:1 DME Agency:  [place] Arranged:  RN, PT [facility] Agency:  [place]  Status of Service:  Completed, signed off  Medicare Important Message Given:  Yes Date Medicare IM Given:    Medicare IM give by:    Date Additional Medicare IM Given:    Additional Medicare Important Message give by:     If discussed at [place] of Stay Meetings, dates discussed:    Additional Comments:  Adron Bene, RN 03/05/2015, 11:07 AM

## 2015-03-05 NOTE — Progress Notes (Signed)
Family Medicine Teaching Service Daily Progress Note Intern Pager: [phone]  Patient name: [name] Medical record number: [id number] Date of birth: 09-20-1923 Age: 82 y.o. Gender: female  Primary Care Provider: MCDIARMID,TODD D, MD Consultants: None  Code Status: Full   Assessment and Plan: [name] is a 82 yo female former smoker presented with progressive dyspnea from AECOPD and acute on chronic diastolic CHF. Developed hypoxic/hypercapnic respiratory failure requiring intubation, is now extubated. PMH is significant for COPD, AFib, HTN, COPD, CKD Stage III, sick sinus syndrome w/ pacemaker, and CAD s/p PCI.   Acute Exacerbation of COPD and hypercapnic respiratory failure: Pt was  intubated on 12/29 per CCM, extubated on 12/31. Transferred to SDU on 1/1. 5 days of Azithro complete (Last dose given on 1/1). Currently sating at upper 90's to 100% on 3 L [place]. CXR this AM: stable COPD - Goal SpO2 > 92%. Wean O2 as tolerated.  - Continue pulmicort, spiriva, and brovana while inpatient. spiriva and advair at discharge - Albuterol PRN; hasn't needed rescue meds  - DUONEBs q 4; not given at midnight and 0400 for some reason - s/p prednisone 30 mg daily 1/01 - 1/02. Prednisone 20 mg daily started on 1/03. Will stop now that she has had 5 day course of steroids. - Incentive spirometry - Expected DC plan: Home with home health services - Ambulate for oxygen requirment   Acute on Chronic CHF: patient with acute resp failure leading to intubation on 12/29. She was diuresed with IV lasix  (last 12/30). She was extubated on 01/01 and transferred to floor.  Echo on 12/29 with EF 55 to 123456, grade 2 diastolic dysfx, mild MR. CXR on 1/1: Pulmonary vascular prominence most notable centrally with slight improved aeration right lung base. Left base subsegmental atelectasis. No currently fluid overloaded on exam.  - Strict I/O; Up 360 cc since admission.  HTN: BP within normal range. Metoprolol XL 50 mg  daily at home.  - Continuing holding at home anti hypertensives  H/o AFib: Patient not in Afib on EKG in ED. On long term coumadin for Afib.  - INR 2.21 at admission, up to 3.8. INR 1.88 this AM -continue amiodarone 200 mg daily, consider changing this medication with PCP as Amiodarone has potential to cause pulmonary toxicity -continue holding metoprolol XL 50 mg daily  -Warfarin dosing per pharmacy  CKD Stage III: Solitary kidney. Patient has h/o transitional cell cancer. Baseline Cr ~1.3. Stable with Cr of 1.35 at admission. Last Cr 1.65 -BMET in AM to monitor Cr   CAD s/p PCI with h/o MI -continue atorvastatin 40 mg daily  -patient with two statins on home med list; will look into this further   Anxiety: Stable.  -Holding home Ativan 0.5 mg q6h prn   Osteoarthritis:  -Holding home Tramadol 50 mg q6h prn   H/o Sick Sinus Syndrome: Pacemaker in place.  FEN/GI: SLIV, regular diet  -Miralax daily  PPx: Coumadin per pharmacy  Disposition: home pending respiratory improvement  Subjective:  - No complaints this morning - Overnight patient remained on 3 L [place], O2 in high 90's-100% - For some reason Duonebs not given at midnight and 4am. Received her 8 am dose. - No BM last night but feels like she needs to go this morning - Abdominal pain resolving, pt believes uncomfortable bed is making it worse   Objective: Temp:  [97.8 F (36.6 C)-98 F (36.7 C)] 97.9 F (36.6 C) (01/05 0855) Pulse Rate:  [61-66] 64 (  01/05 0855) Resp:  [16-18] 17 (01/05 0855) BP: (108-137)/(49-66) 108/49 mmHg (01/05 0855) SpO2:  [95 %-100 %] 100 % (01/05 0855) Weight:  [168 lb 10.4 oz (76.5 kg)] 168 lb 10.4 oz (76.5 kg) (01/05 0537)   Physical Exam: Gen: appears well, no acute distress, sitting up in chair in room Oropharynx: clear, moist CV: regular rate and rythm. S1 & S2 audible Resp: on 3L by [place], some shortness of breath but speaking in full sentences, inspiratory and expiratory wheezes  bilaterally. No accessory muscle usage GI: bowel sounds normal, no tenderness to palpation   Laboratory:  Recent Labs Lab 02/27/15 0311 02/28/15 0357 03/01/15 0330  WBC 9.9 9.9 8.1  HGB 9.8* 10.4* 10.4*  HCT 31.2* 32.7* 32.8*  PLT 207 224 226    Recent Labs Lab 03/01/15 0330 03/02/15 0905 03/04/15 0935  NA 142 142 143  K 4.0 4.1 4.3  CL 104 103 106  CO2 28 29 30   BUN 64* 66* 50*  CREATININE 1.69* 1.65* 1.54*  CALCIUM 8.5* 8.6* 8.8*  GLUCOSE 159* 102* 130*     Imaging/Diagnostic Tests: No results found.   Carlyle Dolly, MD 03/05/2015, 9:27 AM PGY-1, [place] Intern pager: [phone], text pages welcome

## 2015-03-05 NOTE — Progress Notes (Signed)
ANTICOAGULATION CONSULT NOTE - Follow Up Consult  Pharmacy Consult for Coumadin Indication: atrial fibrillation  Allergies  Allergen Reactions  . Baclofen Other (See Comments)    Confusion occurred with taking 3 at the same time.  . Codeine Phosphate Nausea Only  . Norco [Hydrocodone-Acetaminophen] Nausea And Vomiting  . Hydrochlorothiazide Other (See Comments)    Possible acute gouty arthritis of wrist  . Montelukast Sodium Other (See Comments)    Unspecified reaction.     Patient Measurements: Height: 5\' 5"  (165.1 cm) Weight: 168 lb 10.4 oz (76.5 kg) IBW/kg (Calculated) : 57  Vital Signs: Temp: 97.9 F (36.6 C) (01/05 0855) Temp Source: Oral (01/05 0855) BP: 108/49 mmHg (01/05 0855) Pulse Rate: 64 (01/05 0855)  Labs:  Recent Labs  03/03/15 0530 03/04/15 0545 03/04/15 0935 03/05/15 0445  LABPROT 30.9* 21.6*  --  20.2*  INR 3.03* 1.88*  --  1.72*  CREATININE  --   --  1.54*  --     Estimated Creatinine Clearance: 25.3 mL/min (by C-G formula based on Cr of 1.54).  Assessment:   INR is down further to 1.72 today. Bigger drop than expected 1/3->1/4 after usual 1.5 mg dose on 1/3, after holding Coumadin x 3 days.  Coumadin 2.5 mg given on 1/4. Hesitant to increase further with recent supratherapeutic INRs.   Home regimen:  2 mg daily except 1.5 mg on Tuesdays and Thursdays.  Goal of Therapy:  INR 2-3 Monitor platelets by anticoagulation protocol: Yes   Plan:   Coumadin 2.5 mg again today (usual Thursday dose is 2 mg)  Daily PT/INR while inpatient.  Arty Baumgartner, [place] Pager: [phone] 03/05/2015,12:12 PM

## 2015-03-05 NOTE — Care Management Important Message (Signed)
Important Message  Patient Details  Name: [name] MRN: [id number] Date of Birth: Nov 06, 1923   Medicare Important Message Given:  Yes    Ronesha Heenan P Joesphine Schemm 03/05/2015, 2:16 PM

## 2015-03-05 NOTE — Progress Notes (Signed)
SATURATION QUALIFICATIONS: (This note is used to comply with regulatory documentation for home oxygen)  Patient Saturations on Room Air at Rest = 85-87%  Patient Saturations on Room Air while Ambulating = 80-83%  Patient Saturations on 4 Liters of oxygen while Ambulating = 92-95%

## 2015-03-05 NOTE — Telephone Encounter (Signed)
Prior Authorization received from [employer] for Ipratropium-Albuterol. Formulary and PA form placed in provider box for completion. Derl Barrow, RN

## 2015-03-06 ENCOUNTER — Other Ambulatory Visit: Payer: Self-pay | Admitting: Family Medicine

## 2015-03-06 LAB — BASIC METABOLIC PANEL
Anion gap: 7 (ref 5–15)
BUN: 35 mg/dL — AB (ref 6–20)
CHLORIDE: 104 mmol/L (ref 101–111)
CO2: 34 mmol/L — AB (ref 22–32)
Calcium: 8.6 mg/dL — ABNORMAL LOW (ref 8.9–10.3)
Creatinine, Ser: 1.35 mg/dL — ABNORMAL HIGH (ref 0.44–1.00)
GFR calc Af Amer: 39 mL/min — ABNORMAL LOW (ref >60)
GFR calc non Af Amer: 34 mL/min — ABNORMAL LOW (ref >60)
GLUCOSE: 80 mg/dL (ref 65–99)
POTASSIUM: 4.4 mmol/L (ref 3.5–5.1)
Sodium: 145 mmol/L (ref 135–145)

## 2015-03-06 LAB — BRAIN NATRIURETIC PEPTIDE: B Natriuretic Peptide: 413.1 pg/mL — ABNORMAL HIGH (ref 0.0–100.0)

## 2015-03-06 LAB — PROTIME-INR
INR: 1.95 — ABNORMAL HIGH (ref 0.00–1.49)
PROTHROMBIN TIME: 22.1 s — AB (ref 11.6–15.2)

## 2015-03-06 MED ORDER — IPRATROPIUM-ALBUTEROL 0.5-2.5 (3) MG/3ML IN SOLN: 3.0000 mL | Freq: Three times a day (TID) | RESPIRATORY_TRACT | Status: DC

## 2015-03-06 NOTE — Consult Note (Signed)
   [facility] CM Inpatient Consult   03/06/2015  [name] December 24, 1923 [id number] Spoke with inpatient RNCM and this patient has been referred for the pneumonia EMMI program and has accepted per [facility], inpatient [facility].  No further care management needs were identified and patient had declined care management for home visits.  For questions, please call: Natividad Brood, RN BSN [facility] Liaison  [phone] business mobile phone

## 2015-03-06 NOTE — Telephone Encounter (Signed)
PA form faxed to [facility] for review.  Derl Barrow, RN

## 2015-03-06 NOTE — Progress Notes (Signed)
Pt provided with discharge instructions including information on follow up appointments as well as new medications. Pt's IV was dc'd without complication. VSS. Pt and pt's daughter verbalized understanding of home health and who to contact. Pt escorted out via wheelchair by NT.

## 2015-03-06 NOTE — Progress Notes (Signed)
Physical Therapy Treatment Patient Details Name: [name] MRN: [id number] DOB: [birth year] Today's Date: 03/06/2015    History of Present Illness 82 yo female admitted with acute on chronic hypoxic / respiratory failure.PMH CKD III COPD, afib, htn, sick sinus syndrome with pacemaker CAD s/p PCI    PT Comments    Continues to progress nicely towards functional goals. Better tolerance to gait today with less dyspnea, requiring less supplemental O2 (see below.) Will continue to follow and progress as tolerated. Pt to stay with daughter at d/c. Will benefit from HHPT but should progress quickly to cardiopulmonary rehab. She is very motivated to improve her endurance, independence, and functional abilities.  Follow Up Recommendations  Home health PT;Supervision/Assistance - 24 hour (cardiopulmonary rehab when able to leave home safely)     Equipment Recommendations  None recommended by PT    Recommendations for Other Services       Precautions / Restrictions Precautions Precautions: Fall Precaution Comments: OXYGEN Restrictions Weight Bearing Restrictions: No    Mobility  Bed Mobility Overal bed mobility: Modified Independent             General bed mobility comments: incr time to use bed rail  Transfers Overall transfer level: Modified independent Equipment used: None;Rolling walker (2 wheeled)             General transfer comment: performed with and without assistive device. No physical assist needed  Ambulation/Gait Ambulation/Gait assistance: Supervision Ambulation Distance (Feet): 200 Feet Assistive device: Rolling walker (2 wheeled) Gait Pattern/deviations: Step-through pattern;Decreased stride length;Trunk flexed Gait velocity: decreased Gait velocity interpretation: Below normal speed for age/gender General Gait Details: Slow but steady with use of a rolling walker. educated on BORG exertion scale to monitor symptoms of over exertion. Required 2 standing  rest breaks to complete distance. Less dyspneic today during bout. SpO2 94% on 2L supplemental O2, and 93% on 1L supplemental O2 with increased dyspnea. No overt loss of balance noted. Educated on energy conservation techniques and upright posture with pursed lip breathing.   Stairs            Wheelchair Mobility    Modified Rankin (Stroke Patients Only)       Balance                                    Cognition Arousal/Alertness: Awake/alert Behavior During Therapy: WFL for tasks assessed/performed Overall Cognitive Status: Within Functional Limits for tasks assessed                      Exercises General Exercises - Lower Extremity Ankle Circles/Pumps: AROM;Both;10 reps;Supine (LEs elevated)    General Comments General comments (skin integrity, edema, etc.): Resting 02 97% on 1.5L supplemental O2.. Mild BIL LE edema, RN notified. Educated on self care techniques, monitoring respiratory symptoms, energy conservation techniques, and follow-up care for progression of endurance and strength.      Pertinent Vitals/Pain Pain Assessment: No/denies pain Pain Intervention(s): Monitored during session    Home Living                      Prior Function            PT Goals (current goals can now be found in the care plan section) Acute Rehab PT Goals Patient Stated Goal: to play with grandson PT Goal Formulation: With patient Time For Goal Achievement: 03/16/15 Potential  to Achieve Goals: Good Progress towards PT goals: Progressing toward goals    Frequency  Min 3X/week    PT Plan Current plan remains appropriate    Co-evaluation             End of Session Equipment Utilized During Treatment: Oxygen Activity Tolerance: Patient tolerated treatment well Patient left: with call bell/phone within reach;in bed     Time: 0957-1020 PT Time Calculation (min) (ACUTE ONLY): 23 min  Charges:  $Gait Training: 8-22 mins $Self  Care/Home Management: 8-22                    G Codes:      Ellouise Newer 15-Mar-2015, 11:28 AM  Elayne Snare, [place]

## 2015-03-09 ENCOUNTER — Inpatient Hospital Stay: Payer: Medicare Other | Admitting: Student
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
## 2015-03-10 NOTE — Telephone Encounter (Signed)
Questionnaire faxed to [facility] department for review.  Derl Barrow, RN

## 2015-03-11 ENCOUNTER — Telehealth: Payer: Self-pay | Admitting: Family Medicine

## 2015-03-11 DIAGNOSIS — I2510 Atherosclerotic heart disease of native coronary artery without angina pectoris: Secondary | ICD-10-CM | POA: Diagnosis not present

## 2015-03-11 DIAGNOSIS — K219 Gastro-esophageal reflux disease without esophagitis: Secondary | ICD-10-CM | POA: Diagnosis not present

## 2015-03-11 DIAGNOSIS — Z8701 Personal history of pneumonia (recurrent): Secondary | ICD-10-CM | POA: Diagnosis not present

## 2015-03-11 DIAGNOSIS — I5040 Unspecified combined systolic (congestive) and diastolic (congestive) heart failure: Secondary | ICD-10-CM | POA: Diagnosis not present

## 2015-03-11 DIAGNOSIS — F419 Anxiety disorder, unspecified: Secondary | ICD-10-CM | POA: Diagnosis not present

## 2015-03-11 DIAGNOSIS — E781 Pure hyperglyceridemia: Secondary | ICD-10-CM | POA: Diagnosis not present

## 2015-03-11 DIAGNOSIS — Z950 Presence of cardiac pacemaker: Secondary | ICD-10-CM | POA: Diagnosis not present

## 2015-03-11 DIAGNOSIS — M4806 Spinal stenosis, lumbar region: Secondary | ICD-10-CM | POA: Diagnosis not present

## 2015-03-11 DIAGNOSIS — I129 Hypertensive chronic kidney disease with stage 1 through stage 4 chronic kidney disease, or unspecified chronic kidney disease: Secondary | ICD-10-CM | POA: Diagnosis not present

## 2015-03-11 DIAGNOSIS — I4891 Unspecified atrial fibrillation: Secondary | ICD-10-CM | POA: Diagnosis not present

## 2015-03-11 DIAGNOSIS — N183 Chronic kidney disease, stage 3 (moderate): Secondary | ICD-10-CM | POA: Diagnosis not present

## 2015-03-11 DIAGNOSIS — J441 Chronic obstructive pulmonary disease with (acute) exacerbation: Secondary | ICD-10-CM | POA: Diagnosis not present

## 2015-03-11 NOTE — Telephone Encounter (Signed)
Spoke with patient and her spiriva isn't covered anymore with her insurance.  She can be reached at [phone] (cell) once this has been changed.  Alesandro Stueve,CMA

## 2015-03-11 NOTE — Telephone Encounter (Signed)
Please let [name] know that:  Since she is taking Spiriva already, the Spirva plus her Albuterol nebs is the same as DuoNeb medication.  I recommend no new medications added once Duoneb is stopped.  Continue her Albuterol Neb as needed Continue her Spiriva inhaled daily.

## 2015-03-11 NOTE — Telephone Encounter (Signed)
Will forward to MD. Jazmin Hartsell,CMA  

## 2015-03-11 NOTE — Telephone Encounter (Signed)
PA was denied via [facility] Part D.  Medication should be billed under Medicare Part B.  [facility] is aware and will contact patient regarding her Medicare Part B coverage.  Reference number: [id number].  Derl Barrow, RN

## 2015-03-11 NOTE — Telephone Encounter (Signed)
[facility] nurse called because the patients Duoneb is not covered by her insurance. The patient has albuterol and would like something else to go with this so it would be the same as the Duoneb? Please call in to the [place] on [address]. jw

## 2015-03-12 ENCOUNTER — Encounter: Payer: Self-pay | Admitting: Family Medicine

## 2015-03-12 ENCOUNTER — Ambulatory Visit (INDEPENDENT_AMBULATORY_CARE_PROVIDER_SITE_OTHER): Payer: Medicare Other | Admitting: Family Medicine

## 2015-03-12 VITALS — BP 106/72 | HR 68 | Temp 97.6°F | Ht 65.0 in | Wt 162.0 lb

## 2015-03-12 DIAGNOSIS — N183 Chronic kidney disease, stage 3 unspecified: Secondary | ICD-10-CM

## 2015-03-12 DIAGNOSIS — I480 Paroxysmal atrial fibrillation: Secondary | ICD-10-CM

## 2015-03-12 DIAGNOSIS — J441 Chronic obstructive pulmonary disease with (acute) exacerbation: Secondary | ICD-10-CM | POA: Diagnosis not present

## 2015-03-12 DIAGNOSIS — Z7901 Long term (current) use of anticoagulants: Secondary | ICD-10-CM

## 2015-03-12 DIAGNOSIS — I5189 Other ill-defined heart diseases: Secondary | ICD-10-CM

## 2015-03-12 DIAGNOSIS — D509 Iron deficiency anemia, unspecified: Secondary | ICD-10-CM | POA: Diagnosis not present

## 2015-03-12 DIAGNOSIS — J449 Chronic obstructive pulmonary disease, unspecified: Secondary | ICD-10-CM | POA: Diagnosis not present

## 2015-03-12 DIAGNOSIS — I2510 Atherosclerotic heart disease of native coronary artery without angina pectoris: Secondary | ICD-10-CM | POA: Diagnosis not present

## 2015-03-12 DIAGNOSIS — J189 Pneumonia, unspecified organism: Secondary | ICD-10-CM

## 2015-03-12 DIAGNOSIS — I129 Hypertensive chronic kidney disease with stage 1 through stage 4 chronic kidney disease, or unspecified chronic kidney disease: Secondary | ICD-10-CM | POA: Diagnosis not present

## 2015-03-12 DIAGNOSIS — I5040 Unspecified combined systolic (congestive) and diastolic (congestive) heart failure: Secondary | ICD-10-CM | POA: Diagnosis not present

## 2015-03-12 DIAGNOSIS — I4891 Unspecified atrial fibrillation: Secondary | ICD-10-CM | POA: Diagnosis not present

## 2015-03-12 DIAGNOSIS — I519 Heart disease, unspecified: Secondary | ICD-10-CM | POA: Diagnosis not present

## 2015-03-12 LAB — CBC
HCT: 31.2 % — ABNORMAL LOW (ref 36.0–46.0)
Hemoglobin: 10.2 g/dL — ABNORMAL LOW (ref 12.0–15.0)
MCH: 30.9 pg (ref 26.0–34.0)
MCHC: 32.7 g/dL (ref 30.0–36.0)
MCV: 94.5 fL (ref 78.0–100.0)
MPV: 9.7 fL (ref 8.6–12.4)
PLATELETS: 277 10*3/uL (ref 150–400)
RBC: 3.3 MIL/uL — ABNORMAL LOW (ref 3.87–5.11)
RDW: 15.5 % (ref 11.5–15.5)
WBC: 6.8 10*3/uL (ref 4.0–10.5)

## 2015-03-12 LAB — FERRITIN: FERRITIN: 311 ng/mL — AB (ref 10–291)

## 2015-03-12 LAB — POCT INR: INR: 2.4

## 2015-03-12 LAB — BASIC METABOLIC PANEL
BUN: 22 mg/dL (ref 7–25)
CALCIUM: 8.7 mg/dL (ref 8.6–10.4)
CO2: 34 mmol/L — ABNORMAL HIGH (ref 20–31)
Chloride: 102 mmol/L (ref 98–110)
Creat: 1.12 mg/dL — ABNORMAL HIGH (ref 0.60–0.88)
GLUCOSE: 109 mg/dL — AB (ref 65–99)
Potassium: 4.7 mmol/L (ref 3.5–5.3)
Sodium: 143 mmol/L (ref 135–146)

## 2015-03-12 NOTE — Patient Instructions (Addendum)
Start taking Lasix (furosemide) 40 mg tablet, half a tablet on Monday, Wednesday and Fridays. Restart your Spiriva one capsule inhaled every day Continue taking the Albuterol through the nebulizer machice as needed for shortness of breath  Dr McDiarmid will work on Getting the portable oxygen tank through [place] He will set up an appointment with Dr Pernell Dupre (Cardiologist) to follow up your Diastolic Heart Failure.  He will set up an appointment with Neurorehab for Physical Therapy to evaluate you for a Scooter.   I will see you back in 3 to 4 weeks.   Follow up with Coumadin Clinic next week. Please set up appointment with Coumadin Clinic.  We will discuss changing to Eliquis in the future.   Take it easy for next 4 to 8 weeks, you will be weak as a kitten.  Wear your oxygen at least 16 hours a day.   Dr McDiarmid will call you if your tests are not good. Otherwise he will send you a letter.  If you sign up for MyChart online, you will be able to see your test results once Dr McDiarmid has reviewed them.  If you do not hear from [place] with in 2 weeks please call our office

## 2015-03-12 NOTE — Assessment & Plan Note (Signed)
New diagnosis for patient.  Established by Echocardiogram with BNP around 400 earlier in the month while hospitalized with Pulmonary Edema and Pneumonia./ Will start furosemide 20 mg on Monday, Wednesday, Friday. Checking BMET today.  Recheck on next visit./  Schedule consultation with Dr Linard Millers, Mrs Mongeau's cardiologist, since this heart failure diagnosis is a new one.

## 2015-03-12 NOTE — Assessment & Plan Note (Signed)
Established problem Controlled rate and anticoagulation,. INR 2.4 today. Continue Warfarin at current dose and schedule. Requested Mrs Bundy to schedule appointment with her Coumadin Clinic for next week. Will address possible switch to Apixaban after patient has recovered more from this acute illness.  Will check creatinine.  Likely will use Apixaban 2.5 mg twice a day given her age and variable GFR measurements. INR must be below 2.0 before starting Apixaban.

## 2015-03-12 NOTE — Assessment & Plan Note (Signed)
Follow up of acute illness Pneumonia treated and improved. Residual lack of physical endurance and fatiguability.  Pt starting physical therapy today.

## 2015-03-12 NOTE — Assessment & Plan Note (Addendum)
Established problem. Stable. Continue albuterol solution via neb prn. Stop DuoNeb.  Restart Spiriva and Advair.   Wear oxygen supplement 2L/min [place] for at least 16 hours a day or when exerting self.  Recommendation: Recheck need for oxygen supplementation in March.  Request portable oxygen minitank (backpack) from [facility] Reconsultation with Pulmonary Rehab once patient has recovered more from recent acute illness.

## 2015-03-12 NOTE — Progress Notes (Signed)
Patient ID: [name], female   DOB: 1923/03/28, 82 y.o.   MRN: [id number] [name] is accompanied by daughter, [name])  Sources of clinical information for visit is/are patient, relative(s) and past medical records. I reviewed patient's Discharge Summary from 02/25/15 - 03/06/15.  I viewed her CXRs from hospital admission. There are no pending lab results from hospitalization.  This is patient's first venture outside of home since hospital discharge.  Nursing assessment for this office visit was reviewed with the patient for accuracy and revision.   History of hospital admission Patient developed shortness of breath without memorable prodrome while at home.  She did not have preceding head or chest cold symptoms, nor progressive dyspnea on exertion or orthopnea.  She was admitted to [facility] with AECOPD Vs Pneumonia (bibasilar airspace opacities on CXR in ED).  Her respiratory condition worsened such that she required intubation for Mech Ventilation, patchy consolidation Right > Left thought to represent pulmonary edema.  Patient aggressively diuresed and treated with BS abxs.  Pt extubated after 3 days intubation.  Echocardiogram during admission showed Grade two diastolic dysfunction with EF of 55 to 60 %.  Pt was not  Discharged on diuretic tx.   Pt is living with her dgt, [name], during her recovery.  She has been receiving [place] nursing services from [facility], oxygen supplementation (2L/min) via [facility], and will be starting [facility] PT today.   She has not restarted her Spiriva.  She has albuterol solution for nebulizer available.  She is able to complete her ADLs on her own.  She has not started back driving.  Her dgts are transporting her, obtaining her shopping.      Family is planning on going on a cruise with patient in mid-February.  [name] is also planning to go with a friend to [place] at the end of this month.  Patient and dgt are requesting evaluation for a motorized "Scooter".  Pt has wwalker at home.   SH: Medication list was updated by [place]ena [place] Student, Kelsey from patient and dgt's memory and discharge summary. I reassessed update and agree.   ROS: No dysnea at rest No chest pain No cough No fever  Physcial Exam VS reviewed GEN: Alert, Cooperative, Groomed, NAD HEENT: PERRL;  COR: RRR, No M/G/R, No JVD LUNGS: No Acc mm use, speaking in full sentences, no crackles, [employer]AW monophonic wheeze ABDOMEN: (+)BS, soft, NT, ND EXT: Trace peripheral leg edema  Neuro: Walked 100 ft with devlopment of fatigue, pulse ox remained at 97% on room air and at rest;  Gait: full swing phase, good speed,No significant path deviation, Step through +,   Psych: Normal affect/thought/speech/language

## 2015-03-13 ENCOUNTER — Telehealth: Payer: Self-pay | Admitting: *Deleted

## 2015-03-13 DIAGNOSIS — I129 Hypertensive chronic kidney disease with stage 1 through stage 4 chronic kidney disease, or unspecified chronic kidney disease: Secondary | ICD-10-CM | POA: Diagnosis not present

## 2015-03-13 DIAGNOSIS — J449 Chronic obstructive pulmonary disease, unspecified: Secondary | ICD-10-CM

## 2015-03-13 DIAGNOSIS — I2510 Atherosclerotic heart disease of native coronary artery without angina pectoris: Secondary | ICD-10-CM | POA: Diagnosis not present

## 2015-03-13 DIAGNOSIS — I5040 Unspecified combined systolic (congestive) and diastolic (congestive) heart failure: Secondary | ICD-10-CM | POA: Diagnosis not present

## 2015-03-13 DIAGNOSIS — Z7409 Other reduced mobility: Secondary | ICD-10-CM

## 2015-03-13 DIAGNOSIS — I4891 Unspecified atrial fibrillation: Secondary | ICD-10-CM | POA: Diagnosis not present

## 2015-03-13 DIAGNOSIS — J441 Chronic obstructive pulmonary disease with (acute) exacerbation: Secondary | ICD-10-CM | POA: Diagnosis not present

## 2015-03-13 DIAGNOSIS — I5032 Chronic diastolic (congestive) heart failure: Secondary | ICD-10-CM

## 2015-03-13 DIAGNOSIS — N183 Chronic kidney disease, stage 3 (moderate): Secondary | ICD-10-CM | POA: Diagnosis not present

## 2015-03-13 NOTE — Telephone Encounter (Signed)
Herbert Deaner, Physical Therapist with [facility] left voice message stating physical therapy evaluation was completed in the home today.  He is requesting to continue with physical therapy 2 times a week for 4 weeks for fall risk reduction and an order for occupational therapy for energy conservation.  Please give him a call at [phone].  Derl Barrow, RN

## 2015-03-14 ENCOUNTER — Other Ambulatory Visit: Payer: Self-pay | Admitting: Family Medicine

## 2015-03-17 DIAGNOSIS — I129 Hypertensive chronic kidney disease with stage 1 through stage 4 chronic kidney disease, or unspecified chronic kidney disease: Secondary | ICD-10-CM | POA: Diagnosis not present

## 2015-03-17 DIAGNOSIS — N183 Chronic kidney disease, stage 3 (moderate): Secondary | ICD-10-CM | POA: Diagnosis not present

## 2015-03-17 DIAGNOSIS — I2510 Atherosclerotic heart disease of native coronary artery without angina pectoris: Secondary | ICD-10-CM | POA: Diagnosis not present

## 2015-03-17 DIAGNOSIS — J441 Chronic obstructive pulmonary disease with (acute) exacerbation: Secondary | ICD-10-CM | POA: Diagnosis not present

## 2015-03-17 DIAGNOSIS — I5040 Unspecified combined systolic (congestive) and diastolic (congestive) heart failure: Secondary | ICD-10-CM | POA: Diagnosis not present

## 2015-03-17 DIAGNOSIS — Z7409 Other reduced mobility: Secondary | ICD-10-CM

## 2015-03-17 DIAGNOSIS — I4891 Unspecified atrial fibrillation: Secondary | ICD-10-CM | POA: Diagnosis not present

## 2015-03-17 HISTORY — DX: Other reduced mobility: Z74.09

## 2015-03-17 NOTE — Telephone Encounter (Signed)
Please give verbal authorization to [name] of [facility] for physical therapy 2 times a week for 4 weeks for fall risk reduction  I will put an order into [employer] for [place] OT for energy conservation.  If [name] can take a verbal authorization for the OT for evaluation and treatment of [name], please give this order to him as well.

## 2015-03-17 NOTE — Telephone Encounter (Signed)
Verbal order given by Dr. McDiarmid to refill lasix and Ativan.  Derl Barrow, RN

## 2015-03-17 NOTE — Telephone Encounter (Signed)
2nd request.  Ludmilla Mcgillis L, RN  

## 2015-03-18 ENCOUNTER — Telehealth: Payer: Self-pay | Admitting: *Deleted

## 2015-03-18 NOTE — Telephone Encounter (Signed)
Clld Jim Hoffman/PT w/[facility] - LMOVM of his cell [phone].Marland KitchenMarland KitchenGiving verbal authorization per Dr. McDiarmid for PT and [employer] for OT for pt.

## 2015-03-18 NOTE — Telephone Encounter (Signed)
LM confidential message for Clair Gulling. Jenika Chiem,CMA

## 2015-03-18 NOTE — Telephone Encounter (Signed)
Please let PT know that patient was instructed to wear oxygen at least 16 hours a day, preferably more hours a day including 24 hours a day.

## 2015-03-18 NOTE — Telephone Encounter (Signed)
Clair Gulling, Physical Therapist with [facility] called to verify if patient should be on oxygen 24 hours a day.  Patient told him that she was told to be on oxygen 16 hours a day and off for 8 hours.  Please give him a call at [phone].  Derl Barrow, RN

## 2015-03-18 NOTE — Telephone Encounter (Signed)
Will forward to MD to confirm. Jazmin Hartsell,CMA

## 2015-03-19 DIAGNOSIS — H353212 Exudative age-related macular degeneration, right eye, with inactive choroidal neovascularization: Secondary | ICD-10-CM | POA: Diagnosis not present

## 2015-03-20 DIAGNOSIS — I4891 Unspecified atrial fibrillation: Secondary | ICD-10-CM | POA: Diagnosis not present

## 2015-03-20 DIAGNOSIS — I5040 Unspecified combined systolic (congestive) and diastolic (congestive) heart failure: Secondary | ICD-10-CM | POA: Diagnosis not present

## 2015-03-20 DIAGNOSIS — I2510 Atherosclerotic heart disease of native coronary artery without angina pectoris: Secondary | ICD-10-CM | POA: Diagnosis not present

## 2015-03-20 DIAGNOSIS — N183 Chronic kidney disease, stage 3 (moderate): Secondary | ICD-10-CM | POA: Diagnosis not present

## 2015-03-20 DIAGNOSIS — J441 Chronic obstructive pulmonary disease with (acute) exacerbation: Secondary | ICD-10-CM | POA: Diagnosis not present

## 2015-03-20 DIAGNOSIS — I129 Hypertensive chronic kidney disease with stage 1 through stage 4 chronic kidney disease, or unspecified chronic kidney disease: Secondary | ICD-10-CM | POA: Diagnosis not present

## 2015-03-23 ENCOUNTER — Ambulatory Visit (INDEPENDENT_AMBULATORY_CARE_PROVIDER_SITE_OTHER): Payer: Medicare Other

## 2015-03-23 DIAGNOSIS — I5040 Unspecified combined systolic (congestive) and diastolic (congestive) heart failure: Secondary | ICD-10-CM | POA: Diagnosis not present

## 2015-03-23 DIAGNOSIS — I480 Paroxysmal atrial fibrillation: Secondary | ICD-10-CM | POA: Diagnosis not present

## 2015-03-23 DIAGNOSIS — Z5181 Encounter for therapeutic drug level monitoring: Secondary | ICD-10-CM

## 2015-03-23 DIAGNOSIS — I129 Hypertensive chronic kidney disease with stage 1 through stage 4 chronic kidney disease, or unspecified chronic kidney disease: Secondary | ICD-10-CM | POA: Diagnosis not present

## 2015-03-23 DIAGNOSIS — I4891 Unspecified atrial fibrillation: Secondary | ICD-10-CM | POA: Diagnosis not present

## 2015-03-23 DIAGNOSIS — J441 Chronic obstructive pulmonary disease with (acute) exacerbation: Secondary | ICD-10-CM | POA: Diagnosis not present

## 2015-03-23 DIAGNOSIS — I2510 Atherosclerotic heart disease of native coronary artery without angina pectoris: Secondary | ICD-10-CM | POA: Diagnosis not present

## 2015-03-23 DIAGNOSIS — N183 Chronic kidney disease, stage 3 (moderate): Secondary | ICD-10-CM | POA: Diagnosis not present

## 2015-03-23 LAB — POCT INR: INR: 3.6

## 2015-03-24 DIAGNOSIS — Z6828 Body mass index (BMI) 28.0-28.9, adult: Secondary | ICD-10-CM | POA: Diagnosis not present

## 2015-03-24 DIAGNOSIS — J410 Simple chronic bronchitis: Secondary | ICD-10-CM | POA: Diagnosis not present

## 2015-03-25 DIAGNOSIS — I5040 Unspecified combined systolic (congestive) and diastolic (congestive) heart failure: Secondary | ICD-10-CM | POA: Diagnosis not present

## 2015-03-25 DIAGNOSIS — N183 Chronic kidney disease, stage 3 (moderate): Secondary | ICD-10-CM | POA: Diagnosis not present

## 2015-03-25 DIAGNOSIS — J441 Chronic obstructive pulmonary disease with (acute) exacerbation: Secondary | ICD-10-CM | POA: Diagnosis not present

## 2015-03-25 DIAGNOSIS — I2510 Atherosclerotic heart disease of native coronary artery without angina pectoris: Secondary | ICD-10-CM | POA: Diagnosis not present

## 2015-03-25 DIAGNOSIS — I4891 Unspecified atrial fibrillation: Secondary | ICD-10-CM | POA: Diagnosis not present

## 2015-03-25 DIAGNOSIS — I129 Hypertensive chronic kidney disease with stage 1 through stage 4 chronic kidney disease, or unspecified chronic kidney disease: Secondary | ICD-10-CM | POA: Diagnosis not present

## 2015-03-26 DIAGNOSIS — I129 Hypertensive chronic kidney disease with stage 1 through stage 4 chronic kidney disease, or unspecified chronic kidney disease: Secondary | ICD-10-CM | POA: Diagnosis not present

## 2015-03-26 DIAGNOSIS — I5040 Unspecified combined systolic (congestive) and diastolic (congestive) heart failure: Secondary | ICD-10-CM | POA: Diagnosis not present

## 2015-03-26 DIAGNOSIS — I2510 Atherosclerotic heart disease of native coronary artery without angina pectoris: Secondary | ICD-10-CM | POA: Diagnosis not present

## 2015-03-26 DIAGNOSIS — I4891 Unspecified atrial fibrillation: Secondary | ICD-10-CM | POA: Diagnosis not present

## 2015-03-26 DIAGNOSIS — H353222 Exudative age-related macular degeneration, left eye, with inactive choroidal neovascularization: Secondary | ICD-10-CM | POA: Diagnosis not present

## 2015-03-26 DIAGNOSIS — N183 Chronic kidney disease, stage 3 (moderate): Secondary | ICD-10-CM | POA: Diagnosis not present

## 2015-03-26 DIAGNOSIS — J441 Chronic obstructive pulmonary disease with (acute) exacerbation: Secondary | ICD-10-CM | POA: Diagnosis not present

## 2015-03-30 DIAGNOSIS — J441 Chronic obstructive pulmonary disease with (acute) exacerbation: Secondary | ICD-10-CM | POA: Diagnosis not present

## 2015-03-30 DIAGNOSIS — I129 Hypertensive chronic kidney disease with stage 1 through stage 4 chronic kidney disease, or unspecified chronic kidney disease: Secondary | ICD-10-CM | POA: Diagnosis not present

## 2015-03-30 DIAGNOSIS — I2510 Atherosclerotic heart disease of native coronary artery without angina pectoris: Secondary | ICD-10-CM | POA: Diagnosis not present

## 2015-03-30 DIAGNOSIS — I5040 Unspecified combined systolic (congestive) and diastolic (congestive) heart failure: Secondary | ICD-10-CM | POA: Diagnosis not present

## 2015-03-30 DIAGNOSIS — I4891 Unspecified atrial fibrillation: Secondary | ICD-10-CM | POA: Diagnosis not present

## 2015-03-30 DIAGNOSIS — N183 Chronic kidney disease, stage 3 (moderate): Secondary | ICD-10-CM | POA: Diagnosis not present

## 2015-03-31 DIAGNOSIS — R079 Chest pain, unspecified: Secondary | ICD-10-CM | POA: Diagnosis not present

## 2015-04-02 ENCOUNTER — Ambulatory Visit (INDEPENDENT_AMBULATORY_CARE_PROVIDER_SITE_OTHER): Payer: Medicare Other | Admitting: Pharmacist

## 2015-04-02 ENCOUNTER — Encounter: Payer: Self-pay | Admitting: Family Medicine

## 2015-04-02 ENCOUNTER — Ambulatory Visit (INDEPENDENT_AMBULATORY_CARE_PROVIDER_SITE_OTHER): Payer: Medicare Other | Admitting: Family Medicine

## 2015-04-02 VITALS — BP 124/77 | HR 84 | Temp 97.9°F | Wt 163.0 lb

## 2015-04-02 DIAGNOSIS — Z79899 Other long term (current) drug therapy: Secondary | ICD-10-CM | POA: Diagnosis not present

## 2015-04-02 DIAGNOSIS — I480 Paroxysmal atrial fibrillation: Secondary | ICD-10-CM

## 2015-04-02 DIAGNOSIS — M4806 Spinal stenosis, lumbar region: Secondary | ICD-10-CM

## 2015-04-02 DIAGNOSIS — Z5181 Encounter for therapeutic drug level monitoring: Secondary | ICD-10-CM

## 2015-04-02 DIAGNOSIS — I519 Heart disease, unspecified: Secondary | ICD-10-CM | POA: Diagnosis not present

## 2015-04-02 DIAGNOSIS — N184 Chronic kidney disease, stage 4 (severe): Secondary | ICD-10-CM

## 2015-04-02 DIAGNOSIS — I10 Essential (primary) hypertension: Secondary | ICD-10-CM

## 2015-04-02 DIAGNOSIS — M48061 Spinal stenosis, lumbar region without neurogenic claudication: Secondary | ICD-10-CM

## 2015-04-02 DIAGNOSIS — I5189 Other ill-defined heart diseases: Secondary | ICD-10-CM

## 2015-04-02 DIAGNOSIS — J449 Chronic obstructive pulmonary disease, unspecified: Secondary | ICD-10-CM | POA: Diagnosis not present

## 2015-04-02 DIAGNOSIS — I252 Old myocardial infarction: Secondary | ICD-10-CM | POA: Diagnosis not present

## 2015-04-02 LAB — BASIC METABOLIC PANEL
BUN: 24 mg/dL (ref 7–25)
CHLORIDE: 101 mmol/L (ref 98–110)
CO2: 30 mmol/L (ref 20–31)
CREATININE: 1.35 mg/dL — AB (ref 0.60–0.88)
Calcium: 8.6 mg/dL (ref 8.6–10.4)
Glucose, Bld: 87 mg/dL (ref 65–99)
POTASSIUM: 4.2 mmol/L (ref 3.5–5.3)
SODIUM: 141 mmol/L (ref 135–146)

## 2015-04-02 LAB — POCT INR: INR: 4.3

## 2015-04-02 NOTE — Progress Notes (Signed)
Patient ID: AREEYA NATION, female   DOB: 06-Sep-1923, 82 y.o.   MRN: [id number]   Subjective: CC:  Follow-up appointment, no specific complaints HPI: This history was provided by the patient and her daughter.  Patient's PMH significant for essential hypertension, myocardial infarction, sick sinus syndrome, ventricular hypertrophy, CAD, atrial fibrillation, COPD, GERD, osteoarthritis spine and legs, pacemaker, IDA, hypercholesterolemia and anxiety.   Patient is a 82 y.o. female presenting to clinic today for  follow-up after recent discharge from hospital for shortness of breath.  Patient states she was originally told she might have pneumonia, but eventually her treatment team told her she had a COPD exacerbation.    Patient states she is using her albuterol inhaler and nebulizer as well as her Spiriva inhaler as prescribed.  Her insurance will not cover Duonebs, so she would like a separate prescription for Atrovent.  Patient was instructed to wear home oxygen via nasal cannula up 16-24 hours a day.  Patient states she occasionally uses her home oxygen at night, but it dries out her nasal passages and she doesn't like wearing it.  Patient states she monitors her oxygen at home and the readings are generally 96-97% on room air.  Patient states her oxygen is monitored during physical therapy and "it is always fine."  Patient also saw a pulmonlogist at [facility] per Dr. McDiarmid's recommendation.  The pulmonologist was okay with patient not using oxygen daily unless she was in distress.  Patient denies shortness of breath, fever and cough currently.  Patient also denies urinary s/s such as hematuria, dysuria, frequency or urgency and abdominal pain.  Patient states on Tuesday, 1/31, she had an episode of N/V at home that was followed by chest pain.  Patient took a SL nitro tablet and called 911.  EMS checked patient's vitals and did an ECG on scene while also giving her 4 chewable aspirin.  By the time EMS completed  their initial evaluation, the chest pain had resolved and patient felt better.  She declined transport to the hospital.  Patient denies current N/V, dizziness and chest pain.    Patient is undergoing physical therapy and states that is generally going well, but one of her PT exercises last Friday caused right hip pain.  The physical therapist modified the exercises so as to avoid causing right hip pain.  Patient also c/o chronic low back pain and requested a referral to a orthopedist.  Concerns today include:  1. Patient had no concerns  Social History Reviewed: Former smoker. FamHx and MedHx updated.  Please see EMR.  ROS: General: Denies loss of appetite, weight loss, night sweats and fever/chills. HEENT: Patient has complains of closed comedones to left external ear. Denies nasal drainage, cough, sore throat, lymphadenopathy, otalgia. Cardio: Recent episode of chest pain and nausea, but denies chest pain, dizziness, N/V or SOB currently. Pulm: Denies wheezing, cough dyspnea, hemoptysis and inspiratory/expiratory pain. GI: Denies N/V/D, abdominal pain, changes in bowel habits. GU: Denies vaginal discharge, hematuria, dysuria and urinary frequency/urgency. Extremities: Denies arthralgias, swelling or deformities of joints.  Msk:  Complains of right hip pain related to physical therapy and chronic low back pain.  Denies radiation of pain. Skin: Complains of bruising and skin tears to bilateral upper extremities related to recent hospitalization and IV starts.  Denies rash or bruising otherwise. Neuro: Denies confusion, dizziness, changes in vision or altered gait, mobility or coordination.   Denies numbness/tingling in extremities.   Objective: Office vital signs reviewed. BP 124/77 mmHg  Pulse 84  Temp(Src) 97.9 F (36.6 C) (Oral)  Wt 163 lb (73.936 kg)  Physical Examination:  General: Awake, alert, well-nourished, NAD HEENT: Normal    Neck: No masses palpated. No LAD    Ears:  Closed comedones noted to left external ear canal.  Not obstructing canal and no pain to palpation. TMs intact, normal light reflex, no erythema, no bulging, no otorrhea.    Eyes: Sclera white with no injection.  No drainage noted.    Nose: nasal turbinates moist    Throat: MMM, no erythema Cardio: RRR, S1S2 heard, no murmurs appreciated, no lower extremity edema, cyanosis or clubbing; +1 radial and dorsalis pedis pulses bilaterally Pulm: CTAB, no wheezes, rhonchi or rales GI: Soft, NT/ND,+BS x4 Extremities: Patient painful to palpation of right posterior hip, full ROM in extremity and patient is ambulatory without assistance.   MSK: Normal gait and station, no arthralgias Skin: Healing bruises noted to bilateral posterior hands.  Healed skin tear on right posterior forearm that causes scarring which patient covers with make-up.  No rashes or lesions Neuro: Strength and sensation grossly intact.  Assessment/ Plan: 82 y.o. female presents for follow-up after recent hospitalization for COPD exacerbation.  Patient is in no distress and remains active with a cruise planned for the end of February.    Patient denies complaints of shortness of breath, fever/chills or cough since hospital discharge.  She is still staying with her daughter, but plans to return to her own home soon and also intends to resume driving.  Patient had brief episode of chest pain 2 days ago.  She states she called her cardiologist and was instructed to follow-up with the cardiologist who implanted pacemaker.  Patient has an appointment with that cardiologist on 2/17 and confirms she will attend.  She has coumadin clinic appointment this afternoon and plans to go to that.  She is currently taking 20 mg of Lasix M-W-F and denies orthostatic dizziness related to that.  Patient asked about Rx for Atrovent and was reminded that Atrovent and Spiriva serve the same purpose.  As long as she takes Spiriva, she doesn't need Atrovent.     Patient is not using her home oxygen daily and does not believe she needs to.  She asked about a home prescription for mobile oxygen so she can take it with her if she needs it.  That order has been placed. Patient will be referred to orthopedist for evaluation of chronic back pain per her request.  Patient will continue to work with physical therapy.  Patient is under cardiology care for history of cardiac disease and pacemaker.  Patient will follow-up with Dr. Lovena Le regarding pacemaker per instruction by Dr. Thompson Caul office.  Patient states neuro rehab has not contacted them for an appointment.  Another order will be placed for that referral.  Patient and daughter encouraged to call them if they don't hear something by the end of the week.  1. High risk medication use - Basic Metabolic Panel To evaluate Creatinine and Potassium due to use of Lasix   Follow-up: Patient to follow-up in clinic in 2 months unless she requires assistance before then.  Sherron Ales, NP Student [facility]

## 2015-04-02 NOTE — Patient Instructions (Addendum)
Keep taking your Lasix half tablet on Mondays, Wednesdays and Fridays. (20 miiligrams) Dr Fong Mccarry will arrange a consultation with Dr Tonita Cong (Orthopedics) for your back pain.  Dr Shontez Sermon will arrange a consultation with Dr Lamonte Sakai (Pulmonology) for your COPD.  Dr Jorge Retz will call you if your tests are not good. Otherwise he will send you a letter.  If you sign up for MyChart online, you will be able to see your test results once Dr Dannya Pitkin has reviewed them.  If you do not hear from [place] with in 2 weeks please call our office  I will refer you again to [facility] for assessment for a motorized scooter.  If they have not contacted you within a week, please call them to find out what reason.  Please ask [facility] to supply you with a backpack oxygen system to take on your cruise.  I will sign authorizing paper work if they will fax it to my office.

## 2015-04-03 ENCOUNTER — Telehealth: Payer: Self-pay | Admitting: *Deleted

## 2015-04-03 ENCOUNTER — Encounter: Payer: Self-pay | Admitting: Family Medicine

## 2015-04-03 DIAGNOSIS — I2510 Atherosclerotic heart disease of native coronary artery without angina pectoris: Secondary | ICD-10-CM | POA: Diagnosis not present

## 2015-04-03 DIAGNOSIS — J441 Chronic obstructive pulmonary disease with (acute) exacerbation: Secondary | ICD-10-CM | POA: Diagnosis not present

## 2015-04-03 DIAGNOSIS — I129 Hypertensive chronic kidney disease with stage 1 through stage 4 chronic kidney disease, or unspecified chronic kidney disease: Secondary | ICD-10-CM | POA: Diagnosis not present

## 2015-04-03 DIAGNOSIS — N183 Chronic kidney disease, stage 3 (moderate): Secondary | ICD-10-CM | POA: Diagnosis not present

## 2015-04-03 DIAGNOSIS — I5040 Unspecified combined systolic (congestive) and diastolic (congestive) heart failure: Secondary | ICD-10-CM | POA: Diagnosis not present

## 2015-04-03 DIAGNOSIS — I4891 Unspecified atrial fibrillation: Secondary | ICD-10-CM | POA: Diagnosis not present

## 2015-04-03 NOTE — Assessment & Plan Note (Signed)
[name] had episode of substernal to left precordial chest pain last week.   Assessed by EMS at home with Vitals and EKG without acute findings. Improved spontaneously She chose not to go to ED. No recurrences. Patient is to discuss with Dr Lovena Le at next ov in 2 weeks.

## 2015-04-03 NOTE — Assessment & Plan Note (Signed)
Established problem Controlled rate and anticoagulation,. Following up at Anticoagulation Clinic today Will address possible switch to Apixaban after patient returns from [place] Will use Apixaban 2.5 mg twice a day given her age and variable GFR measurements.  INR must be below 2.0 before starting Apixaban.

## 2015-04-03 NOTE — Assessment & Plan Note (Addendum)
Basic Metabolic Panel:    Component Value Date/Time   NA 141 04/02/2015 1129   K 4.2 04/02/2015 1129   CL 101 04/02/2015 1129   CO2 30 04/02/2015 1129   BUN 24 04/02/2015 1129   CREATININE 1.35* 04/02/2015 1129   CREATININE 1.35* 03/06/2015 0812   GLUCOSE 87 04/02/2015 1129   CALCIUM 8.6 04/02/2015 1129  GFR by MDRD = 39 mL/min Stable with introduction of Lasix 20 mg three times a week.

## 2015-04-03 NOTE — Telephone Encounter (Signed)
Herbert Deaner, Physical Therapist with [facility] left voice message on nurse line requesting verbal order for speech therapy due to memory.  Please give him a call at [phone].  Derl Barrow, RN

## 2015-04-03 NOTE — Assessment & Plan Note (Signed)
Established problem Controlled Continue Albuterol nebs prn, Continue daily Spiriva and Advair; Pt seen by Dr Casper Harrison [facility]) for annual follow up last week.  No new recommendations. Pulmonary Rehab possibly this Spring.  Request for portable oxygen minitank from [facility] again made that patient may take on Cruise ship.

## 2015-04-03 NOTE — Assessment & Plan Note (Addendum)
Established problem Controlled Continue Lasix 20 mg on M,W,F Pt intructed by Dr Linard Millers office that her appointment with Dr Lovena Le (EPS) will be opportunity for her to discuss her pulmonary edema event and Grade Two Diastolic Dysfunction.  Serum BNP ~ 1600 03/13/14 with patient not dyspnic at rest but dyspnic with walking 150 ft.

## 2015-04-03 NOTE — Assessment & Plan Note (Signed)
Adequate blood pressure control.  No evidence of new end organ damage.  Tolerating medication without significant adverse effects.  Plan to continue current blood pressure regiment.   

## 2015-04-03 NOTE — Progress Notes (Signed)
Patient ID: [name], female   DOB: November 07, 1923, 82 y.o.   MRN: [id number] I saw the patient with Sherron Ales, NP student. I have reviewed her assessment and repeated the significant findings with the patient We have discussed her assessment and plans.  I agree with them.

## 2015-04-06 ENCOUNTER — Encounter: Payer: Medicare Other | Admitting: Internal Medicine

## 2015-04-06 NOTE — Telephone Encounter (Signed)
Please give verbal authorization to [facility] for Speech Therapy due to cognitive impairment.

## 2015-04-06 NOTE — Telephone Encounter (Signed)
LM on secured VM for Jim. [name],CMA

## 2015-04-07 DIAGNOSIS — I4891 Unspecified atrial fibrillation: Secondary | ICD-10-CM | POA: Diagnosis not present

## 2015-04-07 DIAGNOSIS — J441 Chronic obstructive pulmonary disease with (acute) exacerbation: Secondary | ICD-10-CM | POA: Diagnosis not present

## 2015-04-07 DIAGNOSIS — N183 Chronic kidney disease, stage 3 (moderate): Secondary | ICD-10-CM | POA: Diagnosis not present

## 2015-04-07 DIAGNOSIS — I5040 Unspecified combined systolic (congestive) and diastolic (congestive) heart failure: Secondary | ICD-10-CM | POA: Diagnosis not present

## 2015-04-07 DIAGNOSIS — I2510 Atherosclerotic heart disease of native coronary artery without angina pectoris: Secondary | ICD-10-CM | POA: Diagnosis not present

## 2015-04-07 DIAGNOSIS — I129 Hypertensive chronic kidney disease with stage 1 through stage 4 chronic kidney disease, or unspecified chronic kidney disease: Secondary | ICD-10-CM | POA: Diagnosis not present

## 2015-04-09 ENCOUNTER — Telehealth: Payer: Self-pay | Admitting: *Deleted

## 2015-04-09 ENCOUNTER — Other Ambulatory Visit: Payer: Self-pay | Admitting: Family Medicine

## 2015-04-09 DIAGNOSIS — I4891 Unspecified atrial fibrillation: Secondary | ICD-10-CM | POA: Diagnosis not present

## 2015-04-09 DIAGNOSIS — N183 Chronic kidney disease, stage 3 (moderate): Secondary | ICD-10-CM | POA: Diagnosis not present

## 2015-04-09 DIAGNOSIS — I5040 Unspecified combined systolic (congestive) and diastolic (congestive) heart failure: Secondary | ICD-10-CM | POA: Diagnosis not present

## 2015-04-09 DIAGNOSIS — I129 Hypertensive chronic kidney disease with stage 1 through stage 4 chronic kidney disease, or unspecified chronic kidney disease: Secondary | ICD-10-CM | POA: Diagnosis not present

## 2015-04-09 DIAGNOSIS — I2510 Atherosclerotic heart disease of native coronary artery without angina pectoris: Secondary | ICD-10-CM | POA: Diagnosis not present

## 2015-04-09 DIAGNOSIS — J441 Chronic obstructive pulmonary disease with (acute) exacerbation: Secondary | ICD-10-CM | POA: Diagnosis not present

## 2015-04-09 NOTE — Telephone Encounter (Signed)
Will forward to MD and covering MD to advise. Jazmin Hartsell,CMA

## 2015-04-09 NOTE — Telephone Encounter (Signed)
Herbert Deaner, Physical Therapist with [facility] called stating he is almost at the end of his visits.  His is requesting additional visits starting next week two times a week for 2 weeks.  He also report that patient is having an increase in leg swelling.  Patient increased her lasix intake to an extra half tablet that start today.  Please give him a call at [phone].  Derl Barrow, RN

## 2015-04-09 NOTE — Telephone Encounter (Signed)
Please call in order for additional Physical Therapy as below.  Pls let patient know if edema is worsening or she is getting shortness of breath then she would need to be seen today  Thanks  [place]

## 2015-04-09 NOTE — Telephone Encounter (Signed)
LM with Clair Gulling on confidential VM with message from Dr. Erin Hearing. Delise Simenson,CMA

## 2015-04-10 ENCOUNTER — Ambulatory Visit (INDEPENDENT_AMBULATORY_CARE_PROVIDER_SITE_OTHER): Payer: Medicare Other | Admitting: *Deleted

## 2015-04-10 DIAGNOSIS — I480 Paroxysmal atrial fibrillation: Secondary | ICD-10-CM

## 2015-04-10 DIAGNOSIS — Z5181 Encounter for therapeutic drug level monitoring: Secondary | ICD-10-CM

## 2015-04-10 LAB — POCT INR: INR: 2.5

## 2015-04-10 NOTE — Telephone Encounter (Signed)
Last Rx 1-17 patient should have enough until Dr McDiarmid returns

## 2015-04-13 ENCOUNTER — Other Ambulatory Visit: Payer: Self-pay | Admitting: Family Medicine

## 2015-04-13 DIAGNOSIS — M4806 Spinal stenosis, lumbar region: Secondary | ICD-10-CM | POA: Diagnosis not present

## 2015-04-13 DIAGNOSIS — M545 Low back pain: Secondary | ICD-10-CM | POA: Diagnosis not present

## 2015-04-14 DIAGNOSIS — I2510 Atherosclerotic heart disease of native coronary artery without angina pectoris: Secondary | ICD-10-CM | POA: Diagnosis not present

## 2015-04-14 DIAGNOSIS — N183 Chronic kidney disease, stage 3 (moderate): Secondary | ICD-10-CM | POA: Diagnosis not present

## 2015-04-14 DIAGNOSIS — I129 Hypertensive chronic kidney disease with stage 1 through stage 4 chronic kidney disease, or unspecified chronic kidney disease: Secondary | ICD-10-CM | POA: Diagnosis not present

## 2015-04-14 DIAGNOSIS — I5040 Unspecified combined systolic (congestive) and diastolic (congestive) heart failure: Secondary | ICD-10-CM | POA: Diagnosis not present

## 2015-04-14 DIAGNOSIS — J441 Chronic obstructive pulmonary disease with (acute) exacerbation: Secondary | ICD-10-CM | POA: Diagnosis not present

## 2015-04-14 DIAGNOSIS — I4891 Unspecified atrial fibrillation: Secondary | ICD-10-CM | POA: Diagnosis not present

## 2015-04-16 DIAGNOSIS — I2510 Atherosclerotic heart disease of native coronary artery without angina pectoris: Secondary | ICD-10-CM | POA: Diagnosis not present

## 2015-04-16 DIAGNOSIS — I5040 Unspecified combined systolic (congestive) and diastolic (congestive) heart failure: Secondary | ICD-10-CM | POA: Diagnosis not present

## 2015-04-16 DIAGNOSIS — J441 Chronic obstructive pulmonary disease with (acute) exacerbation: Secondary | ICD-10-CM | POA: Diagnosis not present

## 2015-04-16 DIAGNOSIS — N183 Chronic kidney disease, stage 3 (moderate): Secondary | ICD-10-CM | POA: Diagnosis not present

## 2015-04-16 DIAGNOSIS — I4891 Unspecified atrial fibrillation: Secondary | ICD-10-CM | POA: Diagnosis not present

## 2015-04-16 DIAGNOSIS — I129 Hypertensive chronic kidney disease with stage 1 through stage 4 chronic kidney disease, or unspecified chronic kidney disease: Secondary | ICD-10-CM | POA: Diagnosis not present

## 2015-04-17 ENCOUNTER — Encounter: Payer: Self-pay | Admitting: Internal Medicine

## 2015-04-17 ENCOUNTER — Ambulatory Visit (INDEPENDENT_AMBULATORY_CARE_PROVIDER_SITE_OTHER): Payer: Medicare Other | Admitting: *Deleted

## 2015-04-17 ENCOUNTER — Ambulatory Visit (INDEPENDENT_AMBULATORY_CARE_PROVIDER_SITE_OTHER): Payer: Medicare Other | Admitting: Internal Medicine

## 2015-04-17 ENCOUNTER — Other Ambulatory Visit: Payer: Self-pay | Admitting: Internal Medicine

## 2015-04-17 VITALS — BP 158/90 | HR 67 | Ht 65.0 in | Wt 165.8 lb

## 2015-04-17 DIAGNOSIS — Z950 Presence of cardiac pacemaker: Secondary | ICD-10-CM

## 2015-04-17 DIAGNOSIS — I252 Old myocardial infarction: Secondary | ICD-10-CM

## 2015-04-17 DIAGNOSIS — I495 Sick sinus syndrome: Secondary | ICD-10-CM

## 2015-04-17 DIAGNOSIS — Z5181 Encounter for therapeutic drug level monitoring: Secondary | ICD-10-CM

## 2015-04-17 DIAGNOSIS — I480 Paroxysmal atrial fibrillation: Secondary | ICD-10-CM | POA: Diagnosis not present

## 2015-04-17 DIAGNOSIS — I2510 Atherosclerotic heart disease of native coronary artery without angina pectoris: Secondary | ICD-10-CM

## 2015-04-17 DIAGNOSIS — E7800 Pure hypercholesterolemia, unspecified: Secondary | ICD-10-CM

## 2015-04-17 DIAGNOSIS — Z9189 Other specified personal risk factors, not elsewhere classified: Secondary | ICD-10-CM

## 2015-04-17 LAB — POCT INR: INR: 3.4

## 2015-04-17 MED ORDER — NITROGLYCERIN 0.4 MG SL SUBL
0.4000 mg | SUBLINGUAL_TABLET | SUBLINGUAL | Status: DC | PRN
Start: 2015-04-17 — End: 2015-04-17

## 2015-04-17 MED ORDER — ATORVASTATIN CALCIUM 20 MG PO TABS: 20.0000 mg | ORAL_TABLET | Freq: Every day | ORAL | Status: DC

## 2015-04-17 NOTE — Progress Notes (Signed)
HPI [name] returns today for followup. She is a pleasant 82 yo woman with a h/o symptomatic tachy-brady syndrome, PAF, s/p PPM insertion and copd. She denies chest pain or sob. No syncope. Minimal peripheral edema. The patient was in the hospital a couple of months ago with sob and found to have respiratory failure due to COPD exacerbation. She is much improved.  Allergies  Allergen Reactions  . Baclofen Other (See Comments)    Confusion occurred with taking 3 at the same time.  . Codeine Phosphate Nausea Only  . Norco [Hydrocodone-Acetaminophen] Nausea And Vomiting  . Hydrochlorothiazide Other (See Comments)    Possible acute gouty arthritis of wrist  . Montelukast Sodium Other (See Comments)    Unspecified reaction.      Current Outpatient Prescriptions  Medication Sig Dispense Refill  . albuterol (ACCUNEB) 1.25 MG/3ML nebulizer solution Take 1 ampule by nebulization every 6 (six) hours as needed for wheezing or shortness of breath.    Marland Kitchen albuterol (VENTOLIN HFA) 108 (90 BASE) MCG/ACT inhaler Inhale 2 puffs into the lungs every 6 (six) hours as needed. If needed for shortness of breath. 1 Inhaler 3  . amiodarone (PACERONE) 200 MG tablet Take 1 tablet by mouth daily.  3  . atorvastatin (LIPITOR) 20 MG tablet Take 1 tablet (20 mg total) by mouth daily. 30 tablet 11  . cholecalciferol (VITAMIN D) 1000 UNITS tablet Take 1,000 Units by mouth daily.    . Fluticasone-Salmeterol (ADVAIR DISKUS) 250-50 MCG/DOSE AEPB Inhale 2 puffs into the lungs 2 (two) times daily. 60 each 11  . furosemide (LASIX) 20 MG tablet Take 20 mg by mouth every Monday, Wednesday, and Friday.    Marland Kitchen LORazepam (ATIVAN) 0.5 MG tablet Take 1 tablet (0.5 mg total) by mouth every 8 (eight) hours as needed. for anxiety 30 tablet 0  . metoprolol succinate (TOPROL-XL) 50 MG 24 hr tablet Take 1 tablet (50 mg total) by mouth daily. with food 90 tablet 3  . nitroGLYCERIN (NITROSTAT) 0.4 MG SL tablet Place 1 tablet (0.4 mg  total) under the tongue every 5 (five) minutes as needed for chest pain. 25 tablet 1  . omeprazole (PRILOSEC) 20 MG capsule Take 1 capsule (20 mg total) by mouth daily. 90 capsule 3  . predniSONE (DELTASONE) 5 MG tablet Take 5 mg by mouth. Take as directed    . tiotropium (SPIRIVA) 18 MCG inhalation capsule Place 1 capsule (18 mcg total) into inhaler and inhale daily. 30 capsule PRN  . traMADol (ULTRAM) 50 MG tablet Take 1 tablet (50 mg total) by mouth every 6 (six) hours as needed for moderate pain. 30 tablet 1  . vitamin B-12 (CYANOCOBALAMIN) 1000 MCG tablet Take 1,000 mcg by mouth daily.     Marland Kitchen warfarin (COUMADIN) 1 MG tablet TAKE AS DIRECTED BY ANTICOAGULATION CLINIC (Patient taking differently: Take 1.5-2 mg by mouth daily. Take 1.5mg  on Tuesday and Thursdays.  Take 2mg  all other days of the week.) 270 tablet 0   No current facility-administered medications for this visit.     Past Medical History  Diagnosis Date  . Candidiasis of the esophagus 11/26/2007  . Myocardial infarct, old   . COPD, severe   . Sick sinus syndrome with tachycardia ([place])   . Pacemaker   . Chronic kidney disease (CKD), stage III (moderate)   . Hypertension   . Acute appendicitis with rupture   . Spinal stenosis, lumbar   . Adrenal adenoma     Incidentaloma  .  Spondylolisthesis of lumbar region   . Lumbar herniated disc     History of HNP L4/5 in 2003  . Hx of colonoscopy with polypectomy 04/27/2010    Dr Cristina Gong found three  tubular adenomas each less than 10 mm size  . Retinal hemorrhage of left eye 06/2010  . Cataract 2013    Bilateral   . Abnormal mammogram, unspecified 08/23/2010    Followup imaging reassuring.  Repeat in 6 months.   Marland Kitchen DISC WITH RADICULOPATHY 04/27/2006    Qualifier: Diagnosis of  By: McDiarmid MD, Sherren Mocha    . Transitional cell carcinoma of ureter, history   . Mild cognitive impairment 10/26/2012    (10/25/12) Failed MiniCog screen  . 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 Normal uric acid serum level at time of attack.    Marland Kitchen COPD (chronic obstructive pulmonary disease) ([place])   . EDEMA-LEGS,DUE TO VENOUS OBSTRUCT. 04/27/2006    Qualifier: Diagnosis of  By: McDiarmid MD, Sherren Mocha    . ANXIETY 04/27/2006    Qualifier: Diagnosis of  By: McDiarmid MD, Sherren Mocha    . HERNIA, HIATAL, NONCONGENITAL 04/27/2006    Qualifier: Diagnosis of  By: McDiarmid MD, Sherren Mocha    . History of Hemorrhoids 04/27/2006    Qualifier: Diagnosis of  By: McDiarmid MD, Sherren Mocha    . RHINITIS, ALLERGIC 04/27/2006    Qualifier: Diagnosis of  By: McDiarmid MD, Sherren Mocha    . SCHATZKI'S RING, HX OF 11/26/2007    Qualifier: Diagnosis of  By: McDiarmid MD, Sherren Mocha  An EGD was performed by Dr Cristina Gong on 04/27/2010 for iron deficiency anemia. There was a a transient hiatal hernia with Schatzki's ring. Stomach and duodenum were normal. EGD on 03/06/12 by Dr Cristina Gong for IDA non-obstructing Schatzki's ring at Gastroesophageal junction, otherwise normal esophagus and stomach.    . Urge incontinence 12/13/2011    Diagnosed in 10/2011 by Dr Bjorn Loser (Urology)   . VITAMIN B12 DEFICIENCY 10/07/2009    Qualifier: Diagnosis of  By: McDiarmid MD, Sherren Mocha  Dx based on a post-TKR anemia work-up Low normal serum B12 with high Methylmalonic acid and homocysteine level  Vit B12 serum level (10/28/10) > 1500 pg/mL   . Vitamin D deficiency 11/02/2010    Serum vitamin D 25(OH) = 10.9 ng/mL (30 -100) on 10/28/10 c/w Vitamin D deficiency.     . AF (paroxysmal atrial fibrillation) ([place]) 11/11/2010    Hospitalization (9/8-9/10, Dr Daneen Schick, III, Cardiology) for Paroxysmal Atrial Fibrillation with RVR and anginal pain secondary to demand/supply mismatch in setting of RVR with known circumflex artery branch disease.    Marland Kitchen HYPERTENSION, BENIGN ESSENTIAL 06/18/2007    Qualifier: Diagnosis of  By: McDiarmid MD, Sherren Mocha    . Iron deficiency anemia 08/05/2010    Dr Cristina Gong (GI) has evaluated with  EGD, colonoscopy, and video capsular endoscopy in 2011 & 2012.  All have been unrevealing as to an origin of IDA.  OV with Dr Cristina Gong (10/28/10) assessment of blood in stool per hemoccult and GER. Hbg 12.1 g/dL, MCV 91.8, Ferritin 30 ng/mL. Patient taking on ferrous sulfate tab daily.   EGD on 03/06/12 by Dr Cristina Gong for IDA non-obstructing Schatzki's ring at Gastroesophageal junction, otherwise normal esophagus and stomach.     . Macular degeneration, bilateral 10/04/2010    Right eye is wet MD, the other is dry macular degeneration (ARMD). Pt undergoing some form of vascular endothelial growth factor  inhibition intraocular therapy.    . VENTRICULAR HYPERTROPHY, LEFT 08/28/2008    Qualifier: Diagnosis of  By: McDiarmid MD, Sherren Mocha    . CHRONIC KIDNEY DISEASE STAGE III (MODERATE) 09/02/2009    Patient with Solitary Kidney S/P total Nephrectomy for transitional cell caner.    Marland Kitchen COPD 04/27/2006    Qualifier: Diagnosis of  By: McDiarmid MD, Sherren Mocha    . MYOCARDIAL INFARCTION, HX OF 09/25/2008    Qualifier: Diagnosis of  By: McDiarmid MD, Todd  Hospitalized for NSTEMI ( 10/02/2008). PCI (10/02/2008, Dr Daneen Schick) with RCA [place] placed: 99% lesion to 0% lesion. Circumflex Obtuse marginal with 90% stenosis (left for medical mangement for now)   Hospitalization (9/8-9/10, Dr Daneen Schick, III, Cardiology) for Paroxysmal Atrial Fibrillation with RVR and anginal pain secondary to demand/supply mismatch in setting of RVR with known circumflex artery branch disease.    Marland Kitchen SICK SINUS SYNDROME 04/27/2006    Qualifier: Diagnosis of  By: McDiarmid MD, Sherren Mocha  S/P dual chamber pacemaker placement by Dr Osie Cheeks ([facility]) with pacemaker lead extraction & reinsertion - 07/25/2003  Hospitalization (9/8-9/10, Dr Daneen Schick, III, Cardiology) for Paroxysmal Atrial Fibrillation with RVR and anginal pain secondary to demand/supply mismatch in setting of RVR with known circumflex artery branch disease.    . Solitary kidney, acquired  05/17/2010    Surgical removal for transitional cell cancer by Tresa Endo, MD ([facility]). Surveillance cystoscopy by Dr Alinda Money [facility]) on 10/19/12 without evidence of cystoscopic recurrence. Recommend RTC one year for cystoscopy.   . ADENOMATOUS COLONIC POLYP 03/01/2003    Qualifier: Diagnosis of  By: McDiarmid MD, Sherren Mocha Multiple benign polyps of cecum, ascending, transverse and sigmoid colon by 8/09 colonoscopy by Dr Cristina Gong  Colonoscopy by Dr Cristina Gong for iron-deficiency anemia on 04/27/2010 showed three sessile polyps that were in ascending (3 mm x 9 mm), transverse (4 mm), and cecum (3 mm).  All three were tubular adenomas that were negative for high grade dysplasia or malignancy on pathology. Dr Cristina Gong called the polpys benign and not requiring follow-up in view of the patients age.    . Soft tissue injury of foot 05/03/2011  . PREDIABETES 09/11/2007    Qualifier: Diagnosis of  By: McDiarmid MD, Sherren Mocha    . Numbness and tingling in hands 07/21/2011  . MUSCLE CRAMPS 03/11/2010    Qualifier: Diagnosis of  By: McDiarmid MD, Sherren Mocha    . Mammogram abnormal 02/15/2011  . LUMBAR SPINAL STENOSIS 04/27/2006    Qualifier: Diagnosis of  By: McDiarmid MD, Francisca December HNP w/ L4&5 root encroachment - 05/24/2001,   Spinal Stenosis: L-S MRI: L4-5 Spinal stenosis, - 03/03/2006, Spondylolithesis L5-S1(mild), DJD L4-5 on X-Ray 11/04   . Leg cramps 05/29/2012  . 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 Normal uric acid serum level at time of attack.    . Cellulitis of leg, right 10/01/2012  . Solar lentigo 06/15/2012  . Muscle spasm of back 09/24/2013  . 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  No mass lesion seen. Likely cause of  patients 2 to 3 episodes of bleeding from patient's right eye.    . Shoulder pain, left 01/09/2014  . Seborrheic keratosis, right anterior thigh 12/12/2013  .  High risk medications (not anticoagulants) long-term use 03/05/2012  . COPD, severe ([place]) 04/27/2006    Qualifier: Diagnosis of  By: McDiarmid MD, Sherren Mocha    . Wrist pain, left 02/12/2015  . COPD exacerbation ([place])   . CAP (community acquired pneumonia) 02/25/2015    ROS:   All systems reviewed and negative except as noted in the HPI.   Past Surgical History  Procedure Laterality Date  . Pacemaker insertion      Dr Lovena Le ([facility])  . Nephrectomy  For transition cell cancer     Dr Tresa Endo, surgeon  . Replacement total knee  2009, Right knee    Dr Wynelle Link  . Replacement total knee  05/2009, Left knee    Dr Wynelle Link  . Knee arthroscopy w/ synovectomy  11/2009, left knee    Dr Wynelle Link  . Cholecystectomy    . Esophagogastroduodenoscopy  04/27/2010    Dr Cristina Gong - found transient H/H & Schatzki's ring  . Esophagogastroduodenoscopy endoscopy  03/06/2012    Dr Cristina Gong - found non-obstuctive Schatzki's ring at [employer] [facility]. o/w normal EGD.   Marland Kitchen Pacemaker generator change N/A 11/12/2012    Procedure: PACEMAKER GENERATOR CHANGE;  Surgeon: Evans Lance, MD;  Location[facility] CATH LAB;  Service: Cardiovascular;  Laterality: N/A;  . Tonsillectomy    . Breast surgery      breast reduction  . Eye surgery      bilateral cataracts  . Trigger finger release Right 05/22/2014    Procedure: RIGHT HAND A-1 PULLEY RELEASE ;  Surgeon: Roseanne Kaufman, MD;  Location: [place];  Service: Orthopedics;  Laterality: Right;  . Dupuytren contracture release Right 05/22/2014    Procedure: DUPUYTREN RELEASE AND REPAIR AS NECESSARY RIGHT RING FINGER AND MIDDLE FINGER;  Surgeon: Roseanne Kaufman, MD;  Location: [place];  Service: Orthopedics;  Laterality: Right;     Family History  Problem Relation Age of Onset  . Dementia Mother   . Heart disease Father       Social History   Social History  . Marital Status: Widowed    Spouse Name: N/A  . Number of Children: 4  . Years of Education: N/A   Occupational History  . Futures trader     Retired   Social History Main Topics  . Smoking status: Former Smoker    Types: Cigarettes  . Smokeless tobacco: Never Used  . Alcohol Use: 12.0 oz/week    14 Glasses of wine, 6 Standard drinks or equivalent per week     Comment: 4-5 glasses of wine per week  . Drug Use: No  . Sexual Activity: No   Other Topics Concern  . 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    Semi-retired Futures trader.     Pt has home nebulizer for inhalation therapies.   Former Smoker   Smoking Status:  quit > 5 years ago      (+) DNR status per discussion with Dr McDiarmid 10/25/12 and reiterated 06/20/13 office visit .   (+) Living Will/Advance Directive   (+) HC-POA: [name] (pt's dgt)     BP 158/90 mmHg  Pulse 67  Ht 5\' 5"  (1.651 m)  Wt 165 lb 12.8 oz (75.206 kg)  BMI 27.59 kg/m2  Physical Exam:  Well appearing elderly woman, NAD HEENT: Unremarkable Neck:  6 cm JVD, no thyromegally Back:  No CVA tenderness Lungs:  Clear with no wheezes, reduced breath sounds throughout.  HEART:  Regular rate rhythm, no murmurs, no rubs, no clicks Abd:  soft, positive bowel sounds, no organomegally, no rebound, no guarding Ext:  2 plus pulses, no edema, no cyanosis, no clubbing Skin:  No rashes no nodules Neuro:  CN II through XII intact, motor grossly intact   DEVICE  Normal device function.  See PaceArt for details.   Assess/Plan: 1. PPM - her Medtronic DDD PM is working normally. Will recheck in several months. 2. HTN - her blood pressure remains elevated. She states that at home it is not as high. 3. Atrial fib -  she is maintaining NSR on amiodarone 200 mg daily. Will continue 4. COPD - she remains on low dose prednisone. Will follow.  Mikle Bosworth.D.

## 2015-04-17 NOTE — Patient Instructions (Signed)
Medication Instructions:  Your physician has recommended you make the following change in your medication:  1) DECREASE Lipitor to 20 mg daily  Labwork: None ordered  Testing/Procedures: None ordered  Follow-Up: Remote monitoring is used to monitor your Pacemaker of ICD from home. This monitoring reduces the number of office visits required to check your device to one time per year. It allows [place] to keep an eye on the functioning of your device to ensure it is working properly. You are scheduled for a device check from home on 07/20/2015. You may send your transmission at any time that day. If you have a wireless device, the transmission will be sent automatically. After your physician reviews your transmission, you will receive a postcard with your next transmission date.  Your physician wants you to follow-up in: 1 year with Dr. Lovena Le.  You will receive a reminder letter in the mail two months in advance. If you don't receive a letter, please call our office to schedule the follow-up appointment.  If you need a refill on your cardiac medications before your next appointment, please call your pharmacy.  Thank you for choosing [facility]!!

## 2015-04-19 LAB — CUP PACEART INCLINIC DEVICE CHECK
Battery Impedance: 178 Ohm
Battery Remaining Longevity: 125 mo
Battery Voltage: 2.79 V
Date Time Interrogation Session: 20170217175325
Implantable Lead Implant Date: 20050523
Implantable Lead Model: 5076
Lead Channel Impedance Value: 509 Ohm
Lead Channel Pacing Threshold Pulse Width: 0.4 ms
Lead Channel Sensing Intrinsic Amplitude: 2 mV
Lead Channel Setting Pacing Amplitude: 1.5 V
Lead Channel Setting Pacing Amplitude: 2 V
Lead Channel Setting Pacing Pulse Width: 0.4 ms
Lead Channel Setting Sensing Sensitivity: 5.6 mV
MDC IDC LEAD IMPLANT DT: 20050523
MDC IDC LEAD LOCATION: 753859
MDC IDC LEAD LOCATION: 753860
MDC IDC MSMT LEADCHNL RA PACING THRESHOLD AMPLITUDE: 0.75 V
MDC IDC MSMT LEADCHNL RA PACING THRESHOLD PULSEWIDTH: 0.4 ms
MDC IDC MSMT LEADCHNL RV IMPEDANCE VALUE: 855 Ohm
MDC IDC MSMT LEADCHNL RV PACING THRESHOLD AMPLITUDE: 0.5 V
MDC IDC MSMT LEADCHNL RV SENSING INTR AMPL: 22.4 mV
MDC IDC STAT BRADY AP VP PERCENT: 1 %
MDC IDC STAT BRADY AP VS PERCENT: 96 %
MDC IDC STAT BRADY AS VP PERCENT: 0 %
MDC IDC STAT BRADY AS VS PERCENT: 3 %

## 2015-04-20 ENCOUNTER — Other Ambulatory Visit: Payer: Self-pay | Admitting: Family Medicine

## 2015-04-20 ENCOUNTER — Telehealth: Payer: Self-pay | Admitting: Family Medicine

## 2015-04-20 DIAGNOSIS — I4891 Unspecified atrial fibrillation: Secondary | ICD-10-CM | POA: Diagnosis not present

## 2015-04-20 DIAGNOSIS — I129 Hypertensive chronic kidney disease with stage 1 through stage 4 chronic kidney disease, or unspecified chronic kidney disease: Secondary | ICD-10-CM | POA: Diagnosis not present

## 2015-04-20 DIAGNOSIS — I2510 Atherosclerotic heart disease of native coronary artery without angina pectoris: Secondary | ICD-10-CM | POA: Diagnosis not present

## 2015-04-20 DIAGNOSIS — I5040 Unspecified combined systolic (congestive) and diastolic (congestive) heart failure: Secondary | ICD-10-CM | POA: Diagnosis not present

## 2015-04-20 DIAGNOSIS — N183 Chronic kidney disease, stage 3 (moderate): Secondary | ICD-10-CM | POA: Diagnosis not present

## 2015-04-20 DIAGNOSIS — J441 Chronic obstructive pulmonary disease with (acute) exacerbation: Secondary | ICD-10-CM | POA: Diagnosis not present

## 2015-04-20 NOTE — Telephone Encounter (Signed)
Elmo Putt who is a speech therapist visited the patient today and was told that on Saturday and Sunday the patient experienced chest pain that radiated to her lower jaw and lasted about 1 hour each day. She was not even able to get out of the bed to take her nitro glycerin. The speech therapist is telling the patient to put her nitro glycerin by her bed in case this happens again. jw

## 2015-04-20 NOTE — Telephone Encounter (Signed)
Will forward to MD to make aware. Henny Strauch,CMA  

## 2015-04-21 DIAGNOSIS — I2510 Atherosclerotic heart disease of native coronary artery without angina pectoris: Secondary | ICD-10-CM | POA: Diagnosis not present

## 2015-04-21 DIAGNOSIS — I5040 Unspecified combined systolic (congestive) and diastolic (congestive) heart failure: Secondary | ICD-10-CM | POA: Diagnosis not present

## 2015-04-21 DIAGNOSIS — I129 Hypertensive chronic kidney disease with stage 1 through stage 4 chronic kidney disease, or unspecified chronic kidney disease: Secondary | ICD-10-CM | POA: Diagnosis not present

## 2015-04-21 DIAGNOSIS — I4891 Unspecified atrial fibrillation: Secondary | ICD-10-CM | POA: Diagnosis not present

## 2015-04-21 DIAGNOSIS — N183 Chronic kidney disease, stage 3 (moderate): Secondary | ICD-10-CM | POA: Diagnosis not present

## 2015-04-21 DIAGNOSIS — J441 Chronic obstructive pulmonary disease with (acute) exacerbation: Secondary | ICD-10-CM | POA: Diagnosis not present

## 2015-04-21 NOTE — Telephone Encounter (Signed)
Alonna Minium with [facility] calling regarding verbal orders for recertification for speech therapy when patient comes back from her cruise  on 3/6.  Can reach her at [phone]

## 2015-04-21 NOTE — Telephone Encounter (Signed)
Will forward to MD. Jazmin Hartsell,CMA  

## 2015-04-22 ENCOUNTER — Telehealth: Payer: Self-pay | Admitting: *Deleted

## 2015-04-22 DIAGNOSIS — J441 Chronic obstructive pulmonary disease with (acute) exacerbation: Secondary | ICD-10-CM | POA: Diagnosis not present

## 2015-04-22 DIAGNOSIS — I2510 Atherosclerotic heart disease of native coronary artery without angina pectoris: Secondary | ICD-10-CM | POA: Diagnosis not present

## 2015-04-22 DIAGNOSIS — I4891 Unspecified atrial fibrillation: Secondary | ICD-10-CM | POA: Diagnosis not present

## 2015-04-22 DIAGNOSIS — N183 Chronic kidney disease, stage 3 (moderate): Secondary | ICD-10-CM | POA: Diagnosis not present

## 2015-04-22 DIAGNOSIS — I5040 Unspecified combined systolic (congestive) and diastolic (congestive) heart failure: Secondary | ICD-10-CM | POA: Diagnosis not present

## 2015-04-22 DIAGNOSIS — I129 Hypertensive chronic kidney disease with stage 1 through stage 4 chronic kidney disease, or unspecified chronic kidney disease: Secondary | ICD-10-CM | POA: Diagnosis not present

## 2015-04-22 NOTE — Telephone Encounter (Signed)
[name], Physical Therapist with [facility] called to request additional physical therapy visits.  Per [name] patient would benefit from additional visits.  He is currently at the end of his orders.  Would to additional visit 2 times a week for 1 week. Patient is out of town next week and like orders to start on her return 05/04/15.  The orders would conclude the end of the certification.  Please call [phone].  Tammy Barrow, RN

## 2015-04-23 ENCOUNTER — Ambulatory Visit (INDEPENDENT_AMBULATORY_CARE_PROVIDER_SITE_OTHER): Payer: Medicare Other | Admitting: Pharmacist

## 2015-04-23 DIAGNOSIS — I480 Paroxysmal atrial fibrillation: Secondary | ICD-10-CM

## 2015-04-23 DIAGNOSIS — Z5181 Encounter for therapeutic drug level monitoring: Secondary | ICD-10-CM | POA: Diagnosis not present

## 2015-04-23 LAB — POCT INR: INR: 2.2

## 2015-04-23 NOTE — Telephone Encounter (Signed)
LM on confidential VM. Donnavan Covault,CMA

## 2015-04-23 NOTE — Telephone Encounter (Signed)
LM on VM for [facility]. Seeley Southgate,CMA

## 2015-04-23 NOTE — Telephone Encounter (Signed)
Please call in authorization to [facility] for additional PT visits.  Rx 2 visits for 1 week starting 05/04/15

## 2015-04-23 NOTE — Telephone Encounter (Signed)
Please call [facility] to give verbal orders for recertification for speech therapy for [name].

## 2015-05-04 DIAGNOSIS — N183 Chronic kidney disease, stage 3 (moderate): Secondary | ICD-10-CM | POA: Diagnosis not present

## 2015-05-04 DIAGNOSIS — I2510 Atherosclerotic heart disease of native coronary artery without angina pectoris: Secondary | ICD-10-CM | POA: Diagnosis not present

## 2015-05-04 DIAGNOSIS — I4891 Unspecified atrial fibrillation: Secondary | ICD-10-CM | POA: Diagnosis not present

## 2015-05-04 DIAGNOSIS — I5040 Unspecified combined systolic (congestive) and diastolic (congestive) heart failure: Secondary | ICD-10-CM | POA: Diagnosis not present

## 2015-05-04 DIAGNOSIS — I129 Hypertensive chronic kidney disease with stage 1 through stage 4 chronic kidney disease, or unspecified chronic kidney disease: Secondary | ICD-10-CM | POA: Diagnosis not present

## 2015-05-04 DIAGNOSIS — J441 Chronic obstructive pulmonary disease with (acute) exacerbation: Secondary | ICD-10-CM | POA: Diagnosis not present

## 2015-05-05 ENCOUNTER — Telehealth: Payer: Self-pay | Admitting: Family Medicine

## 2015-05-05 DIAGNOSIS — I2510 Atherosclerotic heart disease of native coronary artery without angina pectoris: Secondary | ICD-10-CM | POA: Diagnosis not present

## 2015-05-05 DIAGNOSIS — F0390 Unspecified dementia without behavioral disturbance: Secondary | ICD-10-CM

## 2015-05-05 DIAGNOSIS — F809 Developmental disorder of speech and language, unspecified: Secondary | ICD-10-CM

## 2015-05-05 DIAGNOSIS — I4891 Unspecified atrial fibrillation: Secondary | ICD-10-CM | POA: Diagnosis not present

## 2015-05-05 DIAGNOSIS — J441 Chronic obstructive pulmonary disease with (acute) exacerbation: Secondary | ICD-10-CM | POA: Diagnosis not present

## 2015-05-05 DIAGNOSIS — I5040 Unspecified combined systolic (congestive) and diastolic (congestive) heart failure: Secondary | ICD-10-CM | POA: Diagnosis not present

## 2015-05-05 DIAGNOSIS — I129 Hypertensive chronic kidney disease with stage 1 through stage 4 chronic kidney disease, or unspecified chronic kidney disease: Secondary | ICD-10-CM | POA: Diagnosis not present

## 2015-05-05 DIAGNOSIS — N183 Chronic kidney disease, stage 3 (moderate): Secondary | ICD-10-CM | POA: Diagnosis not present

## 2015-05-05 NOTE — Telephone Encounter (Signed)
[name] who is a Electrical engineer would like the doctor to put in 2 referral for the patient one to see a neurologist and the second is for the patient to referred to [facility] Outpatient Neuro Speech Pathologist. If you have any questions please call Alice at [phone]. jw

## 2015-05-05 NOTE — Telephone Encounter (Signed)
Will forward to MD. Latish Toutant,CMA  

## 2015-05-06 ENCOUNTER — Ambulatory Visit (INDEPENDENT_AMBULATORY_CARE_PROVIDER_SITE_OTHER): Payer: Medicare Other | Admitting: *Deleted

## 2015-05-06 DIAGNOSIS — I129 Hypertensive chronic kidney disease with stage 1 through stage 4 chronic kidney disease, or unspecified chronic kidney disease: Secondary | ICD-10-CM | POA: Diagnosis not present

## 2015-05-06 DIAGNOSIS — J441 Chronic obstructive pulmonary disease with (acute) exacerbation: Secondary | ICD-10-CM | POA: Diagnosis not present

## 2015-05-06 DIAGNOSIS — I480 Paroxysmal atrial fibrillation: Secondary | ICD-10-CM

## 2015-05-06 DIAGNOSIS — I4891 Unspecified atrial fibrillation: Secondary | ICD-10-CM | POA: Diagnosis not present

## 2015-05-06 DIAGNOSIS — N183 Chronic kidney disease, stage 3 (moderate): Secondary | ICD-10-CM | POA: Diagnosis not present

## 2015-05-06 DIAGNOSIS — Z5181 Encounter for therapeutic drug level monitoring: Secondary | ICD-10-CM

## 2015-05-06 DIAGNOSIS — I2510 Atherosclerotic heart disease of native coronary artery without angina pectoris: Secondary | ICD-10-CM | POA: Diagnosis not present

## 2015-05-06 DIAGNOSIS — I5040 Unspecified combined systolic (congestive) and diastolic (congestive) heart failure: Secondary | ICD-10-CM | POA: Diagnosis not present

## 2015-05-06 LAB — POCT INR: INR: 1.8

## 2015-05-06 NOTE — Telephone Encounter (Signed)
Spoke with [name] Patient no longer home bound.  Pt would like to continue speech therapy as outpatient. Ms [name] is concerned that patient is demented. Dgt requesting referrall to neurologist. Referral to [facility] speech therapy and to [place] neurology made.

## 2015-05-07 DIAGNOSIS — Z01419 Encounter for gynecological examination (general) (routine) without abnormal findings: Secondary | ICD-10-CM | POA: Diagnosis not present

## 2015-05-07 DIAGNOSIS — Z471 Aftercare following joint replacement surgery: Secondary | ICD-10-CM | POA: Diagnosis not present

## 2015-05-07 DIAGNOSIS — Z96653 Presence of artificial knee joint, bilateral: Secondary | ICD-10-CM | POA: Diagnosis not present

## 2015-05-07 DIAGNOSIS — Z124 Encounter for screening for malignant neoplasm of cervix: Secondary | ICD-10-CM | POA: Diagnosis not present

## 2015-05-07 DIAGNOSIS — Z6828 Body mass index (BMI) 28.0-28.9, adult: Secondary | ICD-10-CM | POA: Diagnosis not present

## 2015-05-09 ENCOUNTER — Other Ambulatory Visit: Payer: Self-pay | Admitting: Family Medicine

## 2015-05-09 DIAGNOSIS — J449 Chronic obstructive pulmonary disease, unspecified: Secondary | ICD-10-CM

## 2015-05-09 MED ORDER — AZITHROMYCIN 250 MG PO TABS: ORAL_TABLET | ORAL | Status: DC

## 2015-05-11 ENCOUNTER — Ambulatory Visit: Payer: Medicare Other

## 2015-05-12 DIAGNOSIS — D508 Other iron deficiency anemias: Secondary | ICD-10-CM | POA: Diagnosis not present

## 2015-05-12 DIAGNOSIS — K219 Gastro-esophageal reflux disease without esophagitis: Secondary | ICD-10-CM | POA: Diagnosis not present

## 2015-05-14 ENCOUNTER — Ambulatory Visit (INDEPENDENT_AMBULATORY_CARE_PROVIDER_SITE_OTHER): Payer: Medicare Other | Admitting: Family Medicine

## 2015-05-14 ENCOUNTER — Encounter: Payer: Self-pay | Admitting: Family Medicine

## 2015-05-14 VITALS — BP 122/83 | HR 95 | Temp 97.9°F | Ht 65.0 in | Wt 160.0 lb

## 2015-05-14 DIAGNOSIS — M179 Osteoarthritis of knee, unspecified: Secondary | ICD-10-CM | POA: Diagnosis not present

## 2015-05-14 DIAGNOSIS — J449 Chronic obstructive pulmonary disease, unspecified: Secondary | ICD-10-CM

## 2015-05-14 DIAGNOSIS — Z7901 Long term (current) use of anticoagulants: Secondary | ICD-10-CM | POA: Diagnosis not present

## 2015-05-14 DIAGNOSIS — I480 Paroxysmal atrial fibrillation: Secondary | ICD-10-CM

## 2015-05-14 DIAGNOSIS — M1710 Unilateral primary osteoarthritis, unspecified knee: Secondary | ICD-10-CM

## 2015-05-14 DIAGNOSIS — IMO0002 Reserved for concepts with insufficient information to code with codable children: Secondary | ICD-10-CM

## 2015-05-14 DIAGNOSIS — I2510 Atherosclerotic heart disease of native coronary artery without angina pectoris: Secondary | ICD-10-CM | POA: Diagnosis not present

## 2015-05-14 DIAGNOSIS — H353212 Exudative age-related macular degeneration, right eye, with inactive choroidal neovascularization: Secondary | ICD-10-CM | POA: Diagnosis not present

## 2015-05-14 LAB — POCT INR: INR: 2.7

## 2015-05-15 ENCOUNTER — Other Ambulatory Visit: Payer: Self-pay | Admitting: Family Medicine

## 2015-05-15 ENCOUNTER — Encounter: Payer: Self-pay | Admitting: Family Medicine

## 2015-05-15 DIAGNOSIS — J449 Chronic obstructive pulmonary disease, unspecified: Secondary | ICD-10-CM

## 2015-05-15 NOTE — Assessment & Plan Note (Signed)
Established problem Controlled Dgt, [name], has purchased patient a motorized scooter chair to use.

## 2015-05-15 NOTE — Assessment & Plan Note (Addendum)
Established problem that has improved from AECOPD event last week.  Completed Z-pak.  Recommend Delsym for lingering np cough. Continue Spiriva and advair and prn albuterol mdi. [name] has started consulting with Dr Lamonte Sakai [facility])  Sending order to [facility] to take home oxygen equipment out of home.

## 2015-05-15 NOTE — Assessment & Plan Note (Signed)
Established problem Controlled HR and anticoagulation (INR today 2.8) [name] wants to swtich to [place]. Recommend Tammy Debruin hold next 4 doses of Warfarin, then RTC [facility]) on 05/18/15 for repeat INR. Ask that [facility] lab contact Dr Mahlik Lenn with 3/20 INR so decision of starting Apixaban 2.5 mg twice a day may be made.

## 2015-05-15 NOTE — Progress Notes (Signed)
Patient ID: [name], female   DOB: [birth year], 82 y.o.   MRN: [id number]   Subjective:    Patient ID: [name], female    DOB: 1923/07/20, 82 y.o.   MRN: [id number] [name] is accompanied by patient and daughter, [name] Sources of clinical information for visit is/are patient, relative(s) and past medical records. Nursing assessment for this office visit was reviewed with the patient for accuracy and revision.   HPI  COPD - longstanding issue.  Followed at [facility] and by Dr [name] at [facility] - Using Albuterol MDI twice a day. - taking advair twice a day and S[piriva once a day - Had bronchitis exacerbation with cought, sputum and fatigue.  Took Z-pak with resolution of symptoms except occasional cough.  No sputum.  - Went on ship cruise couple weeks ago and breathing was adequate.  - No current incresase in doe, no pnd, no orthopnea. - Able to walk without stopping for breath.  Hypertension - longstanding problem - taking metoprolol succinate daily. - No recurrence of chest pain isnce early in February when EMS came out to her home.  - No syncope.  No palpitations  Paroxysmal atrial fibrillation- longstanding rpblem - taking metoprolol and warfarin and amiodarone - No feelings of heart beats  - No bleeding - followed by [place] anticoagulation clinic.  Last INR 1.8 last week   Iron Deficiency Anemia - Longstanding problem - restarted taking iron tablet in last month because she was feeling tired.  Her fatigue improved. - Pt seen by Dr [name] (GI) last week.  Reported that Hgb was 11.5 in his office. He wants to see her again in a year.   - No melena or hematocheiz.   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  - No leg edema, no hematuria  Mild Cognitive Impairment - Only decrease in iADLs is allowing her dgt who does bookkeeping to handle her finances  for her online - She is driving but is also riding with her dgt [name].    SH: no smoking   Review of Systems See HPI ROS: No headache No falls No unintentional weight loss     Objective:   Physical Exam  VS reviewed  Reviewed and confirmed nursing note findings. GEN: Alert, Cooperative, Groomed, NAD COR: RRR, No M/G/R, No JVD, Normal PMI size and location LUNGS: BCTA, No Acc mm use, speaking in full sentences EXT: No peripheral leg edema. palpable bilateral pedal pulses.  SKIN: No lesion nor rashes of face/trunk/extremities Neuro: Social conversation fluent, normal gait.  Moving all extremities symmetricallyGait: Normal speed, No significant path deviation, Step through +,  Psych: Normal affect/thought/speech/language    Assessment & Plan:  See problem list

## 2015-05-17 ENCOUNTER — Other Ambulatory Visit: Payer: Self-pay | Admitting: Family Medicine

## 2015-05-17 ENCOUNTER — Other Ambulatory Visit: Payer: Self-pay | Admitting: Interventional Cardiology

## 2015-05-18 ENCOUNTER — Ambulatory Visit (INDEPENDENT_AMBULATORY_CARE_PROVIDER_SITE_OTHER): Payer: Medicare Other | Admitting: *Deleted

## 2015-05-18 ENCOUNTER — Other Ambulatory Visit: Payer: Self-pay | Admitting: Family Medicine

## 2015-05-18 DIAGNOSIS — I480 Paroxysmal atrial fibrillation: Secondary | ICD-10-CM | POA: Diagnosis present

## 2015-05-18 DIAGNOSIS — Z5181 Encounter for therapeutic drug level monitoring: Secondary | ICD-10-CM | POA: Diagnosis not present

## 2015-05-18 LAB — POCT INR: INR: 1.5

## 2015-05-18 MED ORDER — APIXABAN 2.5 MG PO TABS
2.5000 mg | ORAL_TABLET | Freq: Two times a day (BID) | ORAL | Status: DC
Start: 2015-05-18 — End: 2015-12-03

## 2015-05-18 NOTE — Progress Notes (Signed)
Starting Apixaban 2.5 mg bid.   Stopped Warfarin.  INR 1.5 today.

## 2015-05-19 ENCOUNTER — Ambulatory Visit: Payer: Medicare Other | Attending: Family Medicine | Admitting: Speech Pathology

## 2015-05-19 DIAGNOSIS — R41841 Cognitive communication deficit: Secondary | ICD-10-CM | POA: Insufficient documentation

## 2015-05-19 DIAGNOSIS — H353221 Exudative age-related macular degeneration, left eye, with active choroidal neovascularization: Secondary | ICD-10-CM | POA: Diagnosis not present

## 2015-05-19 NOTE — Therapy (Signed)
[facility] [address] [place], [place], [address] Phone: [phone]   Fax:  [phone]  Speech Language Pathology Evaluation  Patient Details  Name: [name] MRN: [id number] Date of Birth: 1923-04-30 Referring Provider: Dr. Sherren Mocha McDiramid  Encounter Date: 05/19/2015      End of Session - 05/19/15 1458    Visit Number 1   Number of Visits 12   Date for SLP Re-Evaluation 06/30/15   SLP Start Time [id number]   SLP Stop Time  1400   SLP Time Calculation (min) 53 min   Activity Tolerance Patient tolerated treatment well      Past Medical History  Diagnosis Date  . Candidiasis of the esophagus 11/26/2007  . Myocardial infarct, old   . COPD, severe   . Sick sinus syndrome with tachycardia ([place])   . Pacemaker   . Chronic kidney disease (CKD), stage III (moderate)   . Hypertension   . Acute appendicitis with rupture   . Spinal stenosis, lumbar   . Adrenal adenoma     Incidentaloma  . Spondylolisthesis of lumbar region   . Lumbar herniated disc     History of HNP L4/5 in 2003  . Hx of colonoscopy with polypectomy 04/27/2010    Dr Cristina Gong found three  tubular adenomas each less than 10 mm size  . Retinal hemorrhage of left eye 06/2010  . Cataract 2013    Bilateral   . Abnormal mammogram, unspecified 08/23/2010    Followup imaging reassuring.  Repeat in 6 months.   Marland Kitchen DISC WITH RADICULOPATHY 04/27/2006    Qualifier: Diagnosis of  By: McDiarmid MD, Sherren Mocha    . Transitional cell carcinoma of ureter, history   . Mild cognitive impairment 10/26/2012    (10/25/12) Failed MiniCog screen  . 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 Normal uric acid serum level at time of attack.    Marland Kitchen COPD (chronic obstructive pulmonary disease) ([place])   . EDEMA-LEGS,DUE TO VENOUS OBSTRUCT. 04/27/2006    Qualifier: Diagnosis of  By: McDiarmid MD, Sherren Mocha    . ANXIETY  04/27/2006    Qualifier: Diagnosis of  By: McDiarmid MD, Sherren Mocha    . HERNIA, HIATAL, NONCONGENITAL 04/27/2006    Qualifier: Diagnosis of  By: McDiarmid MD, Sherren Mocha    . History of Hemorrhoids 04/27/2006    Qualifier: Diagnosis of  By: McDiarmid MD, Sherren Mocha    . RHINITIS, ALLERGIC 04/27/2006    Qualifier: Diagnosis of  By: McDiarmid MD, Sherren Mocha    . SCHATZKI'S RING, HX OF 11/26/2007    Qualifier: Diagnosis of  By: McDiarmid MD, Sherren Mocha  An EGD was performed by Dr Cristina Gong on 04/27/2010 for iron deficiency anemia. There was a a transient hiatal hernia with Schatzki's ring. Stomach and duodenum were normal. EGD on 03/06/12 by Dr Cristina Gong for IDA non-obstructing Schatzki's ring at Gastroesophageal junction, otherwise normal esophagus and stomach.    . Urge incontinence 12/13/2011    Diagnosed in 10/2011 by Dr Bjorn Loser (Urology)   . VITAMIN B12 DEFICIENCY 10/07/2009    Qualifier: Diagnosis of  By: McDiarmid MD, Sherren Mocha  Dx based on a post-TKR anemia work-up Low normal serum B12 with high Methylmalonic acid and homocysteine level  Vit B12 serum level (10/28/10) > 1500 pg/mL   . Vitamin D deficiency 11/02/2010    Serum vitamin D 25(OH) = 10.9 ng/mL (30 -100) on 10/28/10 c/w Vitamin  D deficiency.     . AF (paroxysmal atrial fibrillation) ([place]) 11/11/2010    Hospitalization (9/8-9/10, Dr Daneen Schick, III, Cardiology) for Paroxysmal Atrial Fibrillation with RVR and anginal pain secondary to demand/supply mismatch in setting of RVR with known circumflex artery branch disease.    Marland Kitchen HYPERTENSION, BENIGN ESSENTIAL 06/18/2007    Qualifier: Diagnosis of  By: McDiarmid MD, Sherren Mocha    . Iron deficiency anemia 08/05/2010    Dr Cristina Gong (GI) has evaluated with EGD, colonoscopy, and video capsular endoscopy in 2011 & 2012.  All have been unrevealing as to an origin of IDA.  OV with Dr Cristina Gong (10/28/10) assessment of blood in stool per hemoccult and GER. Hbg 12.1 g/dL, MCV 91.8, Ferritin 30 ng/mL. Patient taking on ferrous sulfate tab daily.   EGD  on 03/06/12 by Dr Cristina Gong for IDA non-obstructing Schatzki's ring at Gastroesophageal junction, otherwise normal esophagus and stomach.     . Macular degeneration, bilateral 10/04/2010    Right eye is wet MD, the other is dry macular degeneration (ARMD). Pt undergoing some form of vascular endothelial growth factor inhibition intraocular therapy.    . VENTRICULAR HYPERTROPHY, LEFT 08/28/2008    Qualifier: Diagnosis of  By: McDiarmid MD, Sherren Mocha    . CHRONIC KIDNEY DISEASE STAGE III (MODERATE) 09/02/2009    Patient with Solitary Kidney S/P total Nephrectomy for transitional cell caner.    Marland Kitchen COPD 04/27/2006    Qualifier: Diagnosis of  By: McDiarmid MD, Sherren Mocha    . MYOCARDIAL INFARCTION, HX OF 09/25/2008    Qualifier: Diagnosis of  By: McDiarmid MD, Todd  Hospitalized for NSTEMI ( 10/02/2008). PCI (10/02/2008, Dr Daneen Schick) with RCA [place] placed: 99% lesion to 0% lesion. Circumflex Obtuse marginal with 90% stenosis (left for medical mangement for now)   Hospitalization (9/8-9/10, Dr Daneen Schick, III, Cardiology) for Paroxysmal Atrial Fibrillation with RVR and anginal pain secondary to demand/supply mismatch in setting of RVR with known circumflex artery branch disease.    Marland Kitchen SICK SINUS SYNDROME 04/27/2006    Qualifier: Diagnosis of  By: McDiarmid MD, Sherren Mocha  S/P dual chamber pacemaker placement by Dr Osie Cheeks ([facility]) with pacemaker lead extraction & reinsertion - 07/25/2003  Hospitalization (9/8-9/10, Dr Daneen Schick, III, Cardiology) for Paroxysmal Atrial Fibrillation with RVR and anginal pain secondary to demand/supply mismatch in setting of RVR with known circumflex artery branch disease.    . Solitary kidney, acquired 05/17/2010    Surgical removal for transitional cell cancer by Tresa Endo, MD ([facility]). Surveillance cystoscopy by Dr Alinda Money [facility]) on 10/19/12 without evidence of cystoscopic recurrence. Recommend RTC one year for cystoscopy.   . ADENOMATOUS COLONIC POLYP 03/01/2003    Qualifier:  Diagnosis of  By: McDiarmid MD, Sherren Mocha Multiple benign polyps of cecum, ascending, transverse and sigmoid colon by 8/09 colonoscopy by Dr Cristina Gong  Colonoscopy by Dr Cristina Gong for iron-deficiency anemia on 04/27/2010 showed three sessile polyps that were in ascending (3 mm x 9 mm), transverse (4 mm), and cecum (3 mm).  All three were tubular adenomas that were negative for high grade dysplasia or malignancy on pathology. Dr Cristina Gong called the polpys benign and not requiring follow-up in view of the patients age.    . Soft tissue injury of foot 05/03/2011  . PREDIABETES 09/11/2007    Qualifier: Diagnosis of  By: McDiarmid MD, Sherren Mocha    . Numbness and tingling in hands 07/21/2011  . MUSCLE CRAMPS 03/11/2010    Qualifier: Diagnosis of  By: McDiarmid MD, Sherren Mocha    .  Mammogram abnormal 02/15/2011  . LUMBAR SPINAL STENOSIS 04/27/2006    Qualifier: Diagnosis of  By: McDiarmid MD, Francisca December HNP w/ L4&5 root encroachment - 05/24/2001,   Spinal Stenosis: L-S MRI: L4-5 Spinal stenosis, - 03/03/2006, Spondylolithesis L5-S1(mild), DJD L4-5 on X-Ray 11/04   . Leg cramps 05/29/2012  . 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 Normal uric acid serum level at time of attack.    . Cellulitis of leg, right 10/01/2012  . Solar lentigo 06/15/2012  . Muscle spasm of back 09/24/2013  . 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  No mass lesion seen. Likely cause of patients 2 to 3 episodes of bleeding from patient's right eye.    . Shoulder pain, left 01/09/2014  . Seborrheic keratosis, right anterior thigh 12/12/2013  . High risk medications (not anticoagulants) long-term use 03/05/2012  . COPD, severe ([place]) 04/27/2006    Qualifier: Diagnosis of  By:  McDiarmid MD, Sherren Mocha    . Wrist pain, left 02/12/2015  . COPD exacerbation ([place])   . CAP (community acquired pneumonia) 02/25/2015  . Decreased functional mobility and endurance 03/17/2015    Past Surgical History  Procedure Laterality Date  . Pacemaker insertion      Dr Lovena Le ([facility])  . Nephrectomy  For transition cell cancer     Dr Tresa Endo, surgeon  . Replacement total knee  2009, Right knee    Dr Wynelle Link  . Replacement total knee  05/2009, Left knee    Dr Wynelle Link  . Knee arthroscopy w/ synovectomy  11/2009, left knee    Dr Wynelle Link  . Cholecystectomy    . Esophagogastroduodenoscopy  04/27/2010    Dr Cristina Gong - found transient H/H & Schatzki's ring  . Esophagogastroduodenoscopy endoscopy  03/06/2012    Dr Cristina Gong - found non-obstuctive Schatzki's ring at [employer]. o/w normal EGD.   Marland Kitchen Pacemaker generator change N/A 11/12/2012    Procedure: PACEMAKER GENERATOR CHANGE;  Surgeon: Evans Lance, MD;  Location: [facility] CATH LAB;  Service: Cardiovascular;  Laterality: N/A;  . Tonsillectomy    . Breast surgery      breast reduction  . Eye surgery      bilateral cataracts  . Trigger finger release Right 05/22/2014    Procedure: RIGHT HAND A-1 PULLEY RELEASE ;  Surgeon: Roseanne Kaufman, MD;  Location: [facility];  Service: Orthopedics;  Laterality: Right;  . Dupuytren contracture release Right 05/22/2014    Procedure: DUPUYTREN RELEASE AND REPAIR AS NECESSARY RIGHT RING FINGER AND MIDDLE FINGER;  Surgeon: Roseanne Kaufman, MD;  Location: [facility];  Service: Orthopedics;  Laterality: Right;    There were no vitals filed for this visit.  Visit Diagnosis: Cognitive communication deficit - Plan: SLP plan of care cert/re-cert      Subjective Assessment - 05/19/15 1311    Subjective "I just need help with my memory"   Patient is accompained by: Family member            SLP Evaluation [facility] - 05/19/15 1350    SLP Visit Information   SLP Received On 05/19/15   Referring Provider Dr. Sherren Mocha  McDiramid   Onset Date MCI dx 2017   Medical Diagnosis MCI/langauage impairment   Subjective  Subjective "My main problem is that I ask my family the same question repeatedly or I tell them the same thing over and over"   Patient/Family Stated Goal "To help me remember"   General Information   Behavioral/Cognition MCI   Mobility Status walks indepedently - denies falls   Prior Functional Status   Cognitive/Linguistic Baseline Baseline deficits   Baseline deficit details MCI   Type of Home House    Lives With Daughter   Available Support Family   Vocation Retired   Associate Professor   Overall Cognitive Status Impaired/Different from baseline   Area of Impairment Memory   Memory Decreased short-term memory   Memory Impaired   Memory Impairment Decreased recall of new information   Executive Function Organizing;Sequencing;Self Correcting   Motor Speech   Overall Motor Speech Appears within functional limits for tasks assessed   Standardized Assessments   Standardized Assessments  Montreal Cognitive Assessment ([place])                      ADULT SLP TREATMENT - 05/19/15 1511    Cognitive-Linquistic Treatment   Skilled Treatment Initiated training on compensations for memory. Instructed pt to journal information in one notebook, especially when she is told that she has asked for the information repeatedly. Daughter instructed to train pt's other family members to cue [name] to write down conversations, questions, phone calls etc. Instructed and demonstrated 2 memory enhancing activities that family can do with [name] at home with rare min A for daughter, usual min A for pt. Encouraged pt and daughter to have family members attend ST sessions as environmental modifcations and compensations will require family support.            SLP Education - 05/19/15 1457    Education provided Yes   Education Details areas of impairment, goals of therapy, compensations vs  remediation, cognitive activities for memory to do at home.   Person(s) Educated Patient;Child(ren)   Methods Explanation;Demonstration;Handout   Comprehension Verbalized understanding;Returned demonstration;Verbal cues required;Need further instruction            SLP Long Term Goals - 05/19/15 1505    SLP LONG TERM GOAL #1   Title Pt will verbalize 3 compensations for reduced memory with rare min A.   Time 6   Period Weeks   Status New   SLP LONG TERM GOAL #2   Title Pt will utilize memory notebook/journal to record answers to questions, conversations, phone messages with occasional min A over 3 sessions.   Time 6   Period Weeks   Status New   SLP LONG TERM GOAL #3   Title Pt and family member/caregiver will report carryover of memory activities at home over 3 sessions with occasional min A   Time 6   Period Weeks   Status New   SLP LONG TERM GOAL #4   Title Pt will utilize memory compensations to recall simple 5-6 items lists, words, messages with occasional min A   Time 6   Period Weeks   Status New          Plan - 05/19/15 1458    Clinical Impression Statement [name], an 82 y.o. female is referred for outpt ST due to c/o memory impairments affecting her daily activites and family relationships. She has been dx with MCI. [name] is accompanied by her daughter. They both deny word finding/language difficulties. [name] c/o her children tell her that she asks the same  question repeatedly and that she repeats herself frequently.  [name] scored a 19/30 on the [facility] Cognitive Assessment (MoCA)., with 26/30 being [facility]. She demonstrated difficulty organizing alternating letter/number trail and attention to detail with placing hands on the clock. Reduced sustained attention noted on serial 7 subtractions. Abstract reasoning also impaired. [name] recalled 4/5 words immediately and 2/5 with a delay. She was oriented to all but the date. [name] reports, becoming  tearful, that she doesn't want to bother her children by asking them the same information over and over. She also reports she forgets where she puts things on occasion, most recently her dental bridge. She and her daughter deny problems managing meds and appointments. I recommend skilled ST short term to train [name] in compensations for MCI and to train her and her family re: memory enhacning activities they can do at home.    Speech Therapy Frequency 2x / week   Duration --  6 weeks   Treatment/Interventions SLP instruction and feedback;Cognitive reorganization;Internal/external aids;Compensatory strategies;Environmental controls;Functional tasks;Cueing hierarchy;Patient/family education   Potential Considerations Ability to learn/carryover information   Family Member Consulted daughter          G-Codes - 2015/06/14 1510    Functional Assessment Tool Used NOMS   Functional Limitations Memory   Memory Current Status [phone]) At least 20 percent but less than 40 percent impaired, limited or restricted   Memory Goal Status [id number]) At least 20 percent but less than 40 percent impaired, limited or restricted      Problem List Patient Active Problem List   Diagnosis Date Noted  . Diastolic dysfunction   . History of iron deficiency 01/16/2015  . Pure hypercholesterolemia 07/31/2014  . Encounter for therapeutic drug monitoring 06/19/2014  . CAD (coronary artery disease), native coronary artery 06/08/2014  . On warfarin therapy 06/01/2014  . Pacemaker 04/02/2013  . Mild cognitive impairment 10/26/2012  . Transitional cell carcinoma of ureter, history   . Chronic anticoagulation 10/01/2012  . Urge incontinence 12/13/2011  . AF (paroxysmal atrial fibrillation) ([place]) 11/11/2010  . Vitamin D deficiency 11/02/2010  . Macular degeneration, bilateral 10/04/2010    Class: Chronic  . Solitary kidney, acquired 05/17/2010  . Spondylolisthesis of lumbar region   . VITAMIN B12 DEFICIENCY  10/07/2009  . Chronic kidney disease (CKD), stage IV (severe) ([place]) 09/02/2009  . MYOCARDIAL INFARCTION, HX OF 09/25/2008  . VENTRICULAR HYPERTROPHY, LEFT 08/28/2008  . At risk for diabetes mellitus 09/11/2007  . HYPERTENSION, BENIGN ESSENTIAL 06/18/2007  . ANXIETY 04/27/2006  . SICK SINUS SYNDROME 04/27/2006  . COPD, severe ([place]) 04/27/2006  . GASTROESOPHAGEAL REFLUX, NO ESOPHAGITIS 04/27/2006  . Osteoarthrosis involving lower leg 04/27/2006  . OSTEOARTHRITIS OF SPINE, NOS 04/27/2006  . LUMBAR SPINAL STENOSIS 04/27/2006    Berlie Stewart, Annye Rusk MS, CCC-SLP Jun 14, 2015, 3:16 PM  [facility] [address] [place], [place], [address] Phone: [phone]   Fax:  [phone]  Name: TANVIR RAKESTRAW MRN: [id number] Date of Birth: 1923-04-18

## 2015-05-19 NOTE — Progress Notes (Signed)
Reviewed. Apixaban 2.5 mg BID started. Warfarin cessation continued.

## 2015-05-19 NOTE — Progress Notes (Signed)
See office visit note for today from Lissa Morales, MD for details. 20 minutes face to face where spent in total with counseling / coordination of care took more than 50% of the total time. Counseling involved discussion of benefits and risks of Apixaban compared to Warfarin, adverse effects to watch for on Apixaban.  Response should bleeding occur.  Pt's pharmacy was contacted to discuss change from warfarin to Apixaban, including informing them of patient's current INR of 1.5 and her estimated GFR with hx of variability.

## 2015-05-19 NOTE — Addendum Note (Signed)
Addended by: Lissa Morales D on: 05/19/2015 07:58 AM   Modules accepted: Level of Service

## 2015-05-19 NOTE — Patient Instructions (Signed)
  Memory Strategies  W - Write it down  A - Associate it with something  R - Repeat it  M - Mental Image     Play the memory game - cards flipped over in rows, columns, and try to remember where each picture is to make a match  [name]  Try to remember 3-5 items on your store list without looking   Study a detailed picture in a magazine for 1 minute, then write down everything you can remember from the picture  Play the alphabet game saying a name and word that begins with each level of the alphabet, taking turns with one another, repeating all words from the beginning   Use journal to write down conversations, questions that your family says you have already asked, phone conversations

## 2015-05-20 ENCOUNTER — Other Ambulatory Visit: Payer: Self-pay

## 2015-05-20 ENCOUNTER — Observation Stay (HOSPITAL_COMMUNITY)
Admission: EM | Admit: 2015-05-20 | Discharge: 2015-05-21 | Disposition: A | Discharge status: Home / Self Care | Payer: Medicare Other | Attending: Family Medicine | Admitting: Family Medicine

## 2015-05-20 ENCOUNTER — Encounter (HOSPITAL_COMMUNITY): Payer: Self-pay | Admitting: Emergency Medicine

## 2015-05-20 ENCOUNTER — Emergency Department (HOSPITAL_COMMUNITY): Payer: Medicare Other

## 2015-05-20 DIAGNOSIS — I2510 Atherosclerotic heart disease of native coronary artery without angina pectoris: Secondary | ICD-10-CM | POA: Diagnosis not present

## 2015-05-20 DIAGNOSIS — Z7951 Long term (current) use of inhaled steroids: Secondary | ICD-10-CM | POA: Insufficient documentation

## 2015-05-20 DIAGNOSIS — Z87891 Personal history of nicotine dependence: Secondary | ICD-10-CM | POA: Insufficient documentation

## 2015-05-20 DIAGNOSIS — R079 Chest pain, unspecified: Secondary | ICD-10-CM | POA: Diagnosis present

## 2015-05-20 DIAGNOSIS — R072 Precordial pain: Secondary | ICD-10-CM | POA: Diagnosis not present

## 2015-05-20 DIAGNOSIS — F419 Anxiety disorder, unspecified: Secondary | ICD-10-CM | POA: Diagnosis not present

## 2015-05-20 DIAGNOSIS — M1990 Unspecified osteoarthritis, unspecified site: Secondary | ICD-10-CM | POA: Diagnosis not present

## 2015-05-20 DIAGNOSIS — N183 Chronic kidney disease, stage 3 (moderate): Secondary | ICD-10-CM | POA: Diagnosis not present

## 2015-05-20 DIAGNOSIS — I480 Paroxysmal atrial fibrillation: Secondary | ICD-10-CM | POA: Diagnosis not present

## 2015-05-20 DIAGNOSIS — Z950 Presence of cardiac pacemaker: Secondary | ICD-10-CM | POA: Insufficient documentation

## 2015-05-20 DIAGNOSIS — Z905 Acquired absence of kidney: Secondary | ICD-10-CM | POA: Diagnosis not present

## 2015-05-20 DIAGNOSIS — Z7901 Long term (current) use of anticoagulants: Secondary | ICD-10-CM | POA: Diagnosis not present

## 2015-05-20 DIAGNOSIS — Z8551 Personal history of malignant neoplasm of bladder: Secondary | ICD-10-CM | POA: Diagnosis not present

## 2015-05-20 DIAGNOSIS — I252 Old myocardial infarction: Secondary | ICD-10-CM | POA: Insufficient documentation

## 2015-05-20 DIAGNOSIS — I495 Sick sinus syndrome: Secondary | ICD-10-CM | POA: Insufficient documentation

## 2015-05-20 DIAGNOSIS — I129 Hypertensive chronic kidney disease with stage 1 through stage 4 chronic kidney disease, or unspecified chronic kidney disease: Secondary | ICD-10-CM | POA: Diagnosis not present

## 2015-05-20 DIAGNOSIS — Z9861 Coronary angioplasty status: Secondary | ICD-10-CM | POA: Insufficient documentation

## 2015-05-20 DIAGNOSIS — I10 Essential (primary) hypertension: Secondary | ICD-10-CM | POA: Diagnosis present

## 2015-05-20 DIAGNOSIS — J449 Chronic obstructive pulmonary disease, unspecified: Secondary | ICD-10-CM | POA: Diagnosis not present

## 2015-05-20 DIAGNOSIS — M479 Spondylosis, unspecified: Secondary | ICD-10-CM | POA: Insufficient documentation

## 2015-05-20 DIAGNOSIS — Z79899 Other long term (current) drug therapy: Secondary | ICD-10-CM | POA: Diagnosis not present

## 2015-05-20 DIAGNOSIS — N184 Chronic kidney disease, stage 4 (severe): Secondary | ICD-10-CM | POA: Diagnosis present

## 2015-05-20 LAB — CBC
HEMATOCRIT: 31.9 % — AB (ref 36.0–46.0)
Hemoglobin: 9.8 g/dL — ABNORMAL LOW (ref 12.0–15.0)
MCH: 30.1 pg (ref 26.0–34.0)
MCHC: 30.7 g/dL (ref 30.0–36.0)
MCV: 97.9 fL (ref 78.0–100.0)
Platelets: 227 10*3/uL (ref 150–400)
RBC: 3.26 MIL/uL — ABNORMAL LOW (ref 3.87–5.11)
RDW: 13.3 % (ref 11.5–15.5)
WBC: 8.4 10*3/uL (ref 4.0–10.5)

## 2015-05-20 LAB — BASIC METABOLIC PANEL
Anion gap: 7 (ref 5–15)
BUN: 23 mg/dL — AB (ref 6–20)
CO2: 26 mmol/L (ref 22–32)
Calcium: 8.3 mg/dL — ABNORMAL LOW (ref 8.9–10.3)
Chloride: 107 mmol/L (ref 101–111)
Creatinine, Ser: 1.55 mg/dL — ABNORMAL HIGH (ref 0.44–1.00)
GFR calc Af Amer: 33 mL/min — ABNORMAL LOW (ref >60)
GFR, EST NON AFRICAN AMERICAN: 29 mL/min — AB (ref >60)
GLUCOSE: 131 mg/dL — AB (ref 65–99)
POTASSIUM: 3.8 mmol/L (ref 3.5–5.1)
Sodium: 140 mmol/L (ref 135–145)

## 2015-05-20 LAB — PROTIME-INR
INR: 1.46 (ref 0.00–1.49)
Prothrombin Time: 17.8 seconds — ABNORMAL HIGH (ref 11.6–15.2)

## 2015-05-20 LAB — I-STAT TROPONIN, ED: Troponin i, poc: 0.02 ng/mL (ref 0.00–0.08)

## 2015-05-20 MED ORDER — SODIUM CHLORIDE 0.9 % IV BOLUS (SEPSIS)
1000.0000 mL | Freq: Once | INTRAVENOUS | Status: AC
  Administered 2015-05-20: 1000 mL via INTRAVENOUS

## 2015-05-20 NOTE — ED Notes (Signed)
Pt arrives via EMS with c/o chest pain ongoing all day, described as 7/10, pressure. Hx AFIB and pacemaker placement. Pt took 2 SL nitro and 324 MG ASA p/t EMS arrival. One epiosde of vomiting, some nausea. Noted to be 80% on RA.

## 2015-05-20 NOTE — ED Provider Notes (Signed)
CSN: [id number]     Arrival date & time 05/20/15  2131 History   None    Chief Complaint  Patient presents with  . Chest Pain     (Consider location/radiation/quality/duration/timing/severity/associated sxs/prior Treatment) Patient is a 82 y.o. female presenting with chest pain. The history is provided by the patient.  Chest Pain Pain location:  Substernal area Pain quality: pressure   Pain radiates to:  Does not radiate Pain radiates to the back: no   Pain severity:  Severe Onset quality:  Sudden Duration:  12 hours (started at 11 AM today while laying in bed) Timing:  Intermittent Progression:  Waxing and waning Chronicity:  New Context comment:  Was laying in bed Relieved by:  Nitroglycerin (helped pain) Worsened by:  Nothing tried Associated symptoms: shortness of breath   Associated symptoms: no abdominal pain, no back pain, no cough, no diaphoresis, no dizziness, no dysphagia, no fatigue, no fever, no headache, no nausea, no palpitations, not vomiting and no weakness     Past Medical History  Diagnosis Date  . Candidiasis of the esophagus 11/26/2007  . Myocardial infarct, old   . COPD, severe   . Sick sinus syndrome with tachycardia ([place])   . Pacemaker   . Chronic kidney disease (CKD), stage III (moderate)   . Hypertension   . Acute appendicitis with rupture   . Spinal stenosis, lumbar   . Adrenal adenoma     Incidentaloma  . Spondylolisthesis of lumbar region   . Lumbar herniated disc     History of HNP L4/5 in 2003  . Hx of colonoscopy with polypectomy 04/27/2010    Dr Cristina Gong found three  tubular adenomas each less than 10 mm size  . Retinal hemorrhage of left eye 06/2010  . Cataract 2013    Bilateral   . Abnormal mammogram, unspecified 08/23/2010    Followup imaging reassuring.  Repeat in 6 months.   Marland Kitchen DISC WITH RADICULOPATHY 04/27/2006    Qualifier: Diagnosis of  By: McDiarmid MD, Sherren Mocha    . Transitional cell carcinoma of ureter, history   . Mild cognitive  impairment 10/26/2012    (10/25/12) Failed MiniCog screen  . 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 Normal uric acid serum level at time of attack.    Marland Kitchen COPD (chronic obstructive pulmonary disease) ([place])   . EDEMA-LEGS,DUE TO VENOUS OBSTRUCT. 04/27/2006    Qualifier: Diagnosis of  By: McDiarmid MD, Sherren Mocha    . ANXIETY 04/27/2006    Qualifier: Diagnosis of  By: McDiarmid MD, Sherren Mocha    . HERNIA, HIATAL, NONCONGENITAL 04/27/2006    Qualifier: Diagnosis of  By: McDiarmid MD, Sherren Mocha    . History of Hemorrhoids 04/27/2006    Qualifier: Diagnosis of  By: McDiarmid MD, Sherren Mocha    . RHINITIS, ALLERGIC 04/27/2006    Qualifier: Diagnosis of  By: McDiarmid MD, Sherren Mocha    . SCHATZKI'S RING, HX OF 11/26/2007    Qualifier: Diagnosis of  By: McDiarmid MD, Sherren Mocha  An EGD was performed by Dr Cristina Gong on 04/27/2010 for iron deficiency anemia. There was a a transient hiatal hernia with Schatzki's ring. Stomach and duodenum were normal. EGD on 03/06/12 by Dr Cristina Gong for IDA non-obstructing Schatzki's ring at Gastroesophageal junction, otherwise normal esophagus and stomach.    . Urge incontinence 12/13/2011    Diagnosed in 10/2011 by Dr Bjorn Loser (Urology)   . VITAMIN B12 DEFICIENCY 10/07/2009  Qualifier: Diagnosis of  By: McDiarmid MD, Sherren Mocha  Dx based on a post-TKR anemia work-up Low normal serum B12 with high Methylmalonic acid and homocysteine level  Vit B12 serum level (10/28/10) > 1500 pg/mL   . Vitamin D deficiency 11/02/2010    Serum vitamin D 25(OH) = 10.9 ng/mL (30 -100) on 10/28/10 c/w Vitamin D deficiency.     . AF (paroxysmal atrial fibrillation) ([place]) 11/11/2010    Hospitalization (9/8-9/10, Dr Daneen Schick, III, Cardiology) for Paroxysmal Atrial Fibrillation with RVR and anginal pain secondary to demand/supply mismatch in setting of RVR with known circumflex artery branch disease.    Marland Kitchen HYPERTENSION, BENIGN ESSENTIAL 06/18/2007    Qualifier:  Diagnosis of  By: McDiarmid MD, Sherren Mocha    . Iron deficiency anemia 08/05/2010    Dr Cristina Gong (GI) has evaluated with EGD, colonoscopy, and video capsular endoscopy in 2011 & 2012.  All have been unrevealing as to an origin of IDA.  OV with Dr Cristina Gong (10/28/10) assessment of blood in stool per hemoccult and GER. Hbg 12.1 g/dL, MCV 91.8, Ferritin 30 ng/mL. Patient taking on ferrous sulfate tab daily.   EGD on 03/06/12 by Dr Cristina Gong for IDA non-obstructing Schatzki's ring at Gastroesophageal junction, otherwise normal esophagus and stomach.     . Macular degeneration, bilateral 10/04/2010    Right eye is wet MD, the other is dry macular degeneration (ARMD). Pt undergoing some form of vascular endothelial growth factor inhibition intraocular therapy.    . VENTRICULAR HYPERTROPHY, LEFT 08/28/2008    Qualifier: Diagnosis of  By: McDiarmid MD, Sherren Mocha    . CHRONIC KIDNEY DISEASE STAGE III (MODERATE) 09/02/2009    Patient with Solitary Kidney S/P total Nephrectomy for transitional cell caner.    Marland Kitchen COPD 04/27/2006    Qualifier: Diagnosis of  By: McDiarmid MD, Sherren Mocha    . MYOCARDIAL INFARCTION, HX OF 09/25/2008    Qualifier: Diagnosis of  By: McDiarmid MD, Todd  Hospitalized for NSTEMI ( 10/02/2008). PCI (10/02/2008, Dr Daneen Schick) with RCA [place] placed: 99% lesion to 0% lesion. Circumflex Obtuse marginal with 90% stenosis (left for medical mangement for now)   Hospitalization (9/8-9/10, Dr Daneen Schick, III, Cardiology) for Paroxysmal Atrial Fibrillation with RVR and anginal pain secondary to demand/supply mismatch in setting of RVR with known circumflex artery branch disease.    Marland Kitchen SICK SINUS SYNDROME 04/27/2006    Qualifier: Diagnosis of  By: McDiarmid MD, Sherren Mocha  S/P dual chamber pacemaker placement by Dr Osie Cheeks ([facility]) with pacemaker lead extraction & reinsertion - 07/25/2003  Hospitalization (9/8-9/10, Dr Daneen Schick, III, Cardiology) for Paroxysmal Atrial Fibrillation with RVR and anginal pain secondary to  demand/supply mismatch in setting of RVR with known circumflex artery branch disease.    . Solitary kidney, acquired 05/17/2010    Surgical removal for transitional cell cancer by Tresa Endo, MD ([facility]). Surveillance cystoscopy by Dr Alinda Money [facility]) on 10/19/12 without evidence of cystoscopic recurrence. Recommend RTC one year for cystoscopy.   . ADENOMATOUS COLONIC POLYP 03/01/2003    Qualifier: Diagnosis of  By: McDiarmid MD, Sherren Mocha Multiple benign polyps of cecum, ascending, transverse and sigmoid colon by 8/09 colonoscopy by Dr Cristina Gong  Colonoscopy by Dr Cristina Gong for iron-deficiency anemia on 04/27/2010 showed three sessile polyps that were in ascending (3 mm x 9 mm), transverse (4 mm), and cecum (3 mm).  All three were tubular adenomas that were negative for high grade dysplasia or malignancy on pathology. Dr Cristina Gong called the polpys benign and not requiring follow-up  in view of the patients age.    . Soft tissue injury of foot 05/03/2011  . PREDIABETES 09/11/2007    Qualifier: Diagnosis of  By: McDiarmid MD, Sherren Mocha    . Numbness and tingling in hands 07/21/2011  . MUSCLE CRAMPS 03/11/2010    Qualifier: Diagnosis of  By: McDiarmid MD, Sherren Mocha    . Mammogram abnormal 02/15/2011  . LUMBAR SPINAL STENOSIS 04/27/2006    Qualifier: Diagnosis of  By: McDiarmid MD, Francisca December HNP w/ L4&5 root encroachment - 05/24/2001,   Spinal Stenosis: L-S MRI: L4-5 Spinal stenosis, - 03/03/2006, Spondylolithesis L5-S1(mild), DJD L4-5 on X-Ray 11/04   . Leg cramps 05/29/2012  . 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 Normal uric acid serum level at time of attack.    . Cellulitis of leg, right 10/01/2012  . Solar lentigo 06/15/2012  . Muscle spasm of back 09/24/2013  . 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  No mass lesion seen. Likely cause of patients 2 to 3 episodes of bleeding from patient's right eye.    . Shoulder pain, left 01/09/2014  . Seborrheic keratosis, right anterior thigh 12/12/2013  . High risk medications (not anticoagulants) long-term use 03/05/2012  . COPD, severe ([place]) 04/27/2006    Qualifier: Diagnosis of  By: McDiarmid MD, Sherren Mocha    . Wrist pain, left 02/12/2015  . COPD exacerbation ([place])   . CAP (community acquired pneumonia) 02/25/2015  . Decreased functional mobility and endurance 03/17/2015   Past Surgical History  Procedure Laterality Date  . Pacemaker insertion      Dr Lovena Le ([facility])  . Nephrectomy  For transition cell cancer     Dr Tresa Endo, surgeon  . Replacement total knee  2009, Right knee    Dr Wynelle Link  . Replacement total knee  05/2009, Left knee    Dr Wynelle Link  . Knee arthroscopy w/ synovectomy  11/2009, left knee    Dr Wynelle Link  . Cholecystectomy    . Esophagogastroduodenoscopy  04/27/2010    Dr Cristina Gong - found transient H/H & Schatzki's ring  . Esophagogastroduodenoscopy endoscopy  03/06/2012    Dr Cristina Gong - found non-obstuctive Schatzki's ring at [employer] [facility]. o/w normal EGD.   Marland Kitchen Pacemaker generator change N/A 11/12/2012    Procedure: PACEMAKER GENERATOR CHANGE;  Surgeon: Evans Lance, MD;  Location: [facility] CATH LAB;  Service: Cardiovascular;  Laterality: N/A;  . Tonsillectomy    . Breast surgery      breast reduction  . Eye surgery      bilateral cataracts  . Trigger finger release Right 05/22/2014    Procedure: RIGHT HAND A-1 PULLEY RELEASE ;  Surgeon: Roseanne Kaufman, MD;  Location: [place];  Service: Orthopedics;  Laterality: Right;  . Dupuytren contracture release Right 05/22/2014    Procedure: DUPUYTREN RELEASE AND REPAIR AS NECESSARY RIGHT RING FINGER AND MIDDLE FINGER;  Surgeon: Roseanne Kaufman, MD;  Location: [place];  Service: Orthopedics;  Laterality: Right;    Family History  Problem Relation Age of Onset  . Dementia Mother   . Heart disease Father    Social History  Substance Use Topics  . Smoking status: Former Smoker    Types: Cigarettes  . Smokeless tobacco: Never  Used  . Alcohol Use: 12.0 oz/week    14 Glasses of wine, 6 Standard drinks or equivalent per week     Comment: 4-5 glasses of wine per week   OB History    No data available     Review of Systems  Constitutional: Negative for fever, chills, diaphoresis, activity change, appetite change and fatigue.  HENT: Negative for facial swelling, rhinorrhea, sore throat, trouble swallowing and voice change.   Eyes: Negative for photophobia, pain and visual disturbance.  Respiratory: Positive for chest tightness and shortness of breath. Negative for cough, wheezing and stridor.   Cardiovascular: Positive for chest pain. Negative for palpitations and leg swelling.  Gastrointestinal: Negative for nausea, vomiting, abdominal pain, constipation and anal bleeding.  Endocrine: Negative.   Genitourinary: Negative for dysuria, vaginal bleeding, vaginal discharge and vaginal pain.  Musculoskeletal: Negative for myalgias, back pain and arthralgias.  Skin: Negative.  Negative for rash.  Allergic/Immunologic: Negative.   Neurological: Negative for dizziness, tremors, syncope, weakness and headaches.  Psychiatric/Behavioral: Negative for suicidal ideas, sleep disturbance and self-injury.  All other systems reviewed and are negative.     Allergies  Baclofen; Codeine phosphate; Norco; Hydrochlorothiazide; and Montelukast sodium  Home Medications   Prior to Admission medications   Medication Sig Start Date End Date Taking? Authorizing Provider  albuterol (ACCUNEB) 1.25 MG/3ML nebulizer solution Take 1 ampule by nebulization every 6 (six) hours as needed for wheezing or shortness of breath.   Yes Historical Provider, MD  albuterol (VENTOLIN HFA) 108 (90 BASE) MCG/ACT inhaler Inhale 2 puffs  into the lungs every 6 (six) hours as needed. If needed for shortness of breath. 02/14/14  Yes Blane Ohara McDiarmid, MD  amiodarone (PACERONE) 200 MG tablet Take 1 tablet (200 mg total) by mouth daily. 05/18/15  Yes Evans Lance, MD  apixaban (ELIQUIS) 2.5 MG TABS tablet Take 1 tablet (2.5 mg total) by mouth 2 (two) times daily. 05/18/15  Yes Blane Ohara McDiarmid, MD  atorvastatin (LIPITOR) 20 MG tablet Take 1 tablet (20 mg total) by mouth daily. 04/17/15  Yes Evans Lance, MD  cholecalciferol (VITAMIN D) 1000 UNITS tablet Take 1,000 Units by mouth daily.   Yes Historical Provider, MD  Fluticasone-Salmeterol (ADVAIR DISKUS) 250-50 MCG/DOSE AEPB Inhale 2 puffs into the lungs 2 (two) times daily. 03/05/15  Yes Mercy Riding, MD  furosemide (LASIX) 40 MG tablet TAKE 1 TABLET BY MOUTH EVERY MORNING 04/23/15  Yes Blane Ohara McDiarmid, MD  LORazepam (ATIVAN) 0.5 MG tablet TAKE 1 TABLET EVERY 8 HOURS AS NEEDED FOR ANXIETY OR SHORTNESS OF BREATH. 05/18/15  Yes Blane Ohara McDiarmid, MD  metoprolol succinate (TOPROL-XL) 50 MG 24 hr tablet Take 1 tablet (50 mg total) by mouth daily. with food 06/01/14  Yes Almyra Deforest, PA  nitroGLYCERIN (NITROSTAT) 0.4 MG SL tablet PLACE 1 TABLET UNDER THE TONGUE EVERY 5 MINUTES AS NEEDED FOR CHEST PAIN 04/17/15  Yes Evans Lance, MD  omeprazole (PRILOSEC) 20 MG capsule Take 1 capsule (20 mg total) by mouth daily. 11/07/14  Yes Blane Ohara McDiarmid, MD  tiotropium (SPIRIVA) 18 MCG inhalation capsule Place 1 capsule (18 mcg total) into inhaler and inhale daily. 01/15/15  Yes Blane Ohara McDiarmid, MD  traMADol (ULTRAM) 50 MG tablet Take 1 tablet (50 mg total) by mouth every 6 (six) hours as needed for moderate pain. 11/17/14  Yes Sela Hua, MD  vitamin B-12 (CYANOCOBALAMIN) 1000 MCG tablet Take 1,000 mcg by mouth daily.    Yes Historical Provider,  MD  azithromycin (ZITHROMAX) 250 MG tablet Take 2 tabs day 1, then 1 tab daily Patient not taking: Reported on 05/20/2015 05/09/15   Blane Ohara McDiarmid, MD   BP 100/57  mmHg  Pulse 62  Temp(Src) 97.3 F (36.3 C) (Oral)  Resp 19  SpO2 100% Physical Exam  Constitutional: She appears well-developed and well-nourished. No distress.  HENT:  Head: Normocephalic and atraumatic.  Right Ear: External ear normal.  Left Ear: External ear normal.  Mouth/Throat: Oropharynx is clear and moist. No oropharyngeal exudate.  Eyes: Conjunctivae and EOM are normal. Pupils are equal, round, and reactive to light. No scleral icterus.  Neck: Normal range of motion. Neck supple. No JVD present. No tracheal deviation present. No thyromegaly present.  Cardiovascular: Normal rate, regular rhythm and intact distal pulses.   Pulmonary/Chest: Effort normal and breath sounds normal. No respiratory distress. She has no wheezes. She has no rales.  Abdominal: Soft. Bowel sounds are normal. She exhibits no distension. There is no tenderness.  Musculoskeletal: Normal range of motion. She exhibits no edema or tenderness.  Neurological: She is alert. No cranial nerve deficit. She exhibits normal muscle tone. Coordination normal.  GCS 15  Skin: Skin is warm and dry. She is not diaphoretic. No pallor.  Psychiatric: She has a normal mood and affect. She expresses no homicidal and no suicidal ideation. She expresses no suicidal plans and no homicidal plans.  Nursing note and vitals reviewed.   ED Course  Procedures (including critical care time) Labs Review Labs Reviewed  BASIC METABOLIC PANEL - Abnormal; Notable for the following:    Glucose, Bld 131 (*)    BUN 23 (*)    Creatinine, Ser 1.55 (*)    Calcium 8.3 (*)    GFR calc non Af Amer 29 (*)    GFR calc Af Amer 33 (*)    All other components within normal limits  CBC - Abnormal; Notable for the following:    RBC 3.26 (*)    Hemoglobin 9.8 (*)    HCT 31.9 (*)    All other components within normal limits  PROTIME-INR - Abnormal; Notable for the following:    Prothrombin Time 17.8 (*)    All other components within normal limits   I-STAT TROPOININ, ED    Imaging Review Dg Chest 2 View  05/20/2015  CLINICAL DATA:  Midsternal chest pain and dyspnea today. One episode of emesis. Former smoker. History of COPD and atrial fibrillation. EXAM: CHEST  2 VIEW COMPARISON:  03/04/2015 FINDINGS: To lead pacemaker remains in place. The cardiac silhouette remains slightly enlarged. Thoracic aortic atherosclerosis is noted. The lungs remain hyperinflated with chronic interstitial coarsening. Opacity in the left mid to lower lung is unchanged and most likely reflects scarring. No acute osseous abnormality is identified. IMPRESSION: No active cardiopulmonary disease. Electronically Signed   By: Logan Bores M.D.   On: 05/20/2015 22:15   I have personally reviewed and evaluated these images and lab results as part of my medical decision-making.   EKG Interpretation None      MDM   Final diagnoses:  Chest pain, unspecified chest pain type    The patient is an 82 year old female with a history of pacemaker implantation for sick sinus syndrome as well as previous myocardial infarction, and CK D presents for 12 hours of substernal chest pain that resolved with nitroglycerin glycerin and aspirin prior to arrival. No acute signs of ischemia on EKG and original troponin within normal limits. Patient was noted  to have an oxygen requirement of 4 L on arrival. Although she has previously had an oxygen requirement of oxygen was discontinued approximately one month ago. His oxygen is able to be weaned down to 1 L in the emergency department. Patient has one low blood pressure reading of 80s over 40s however is a symptomatic and mentating normally during this time. Chest pain continues to have resolved and is not active during this time. Patient was given 1 L fluid with improvement in blood pressure and continues to be asymptomatic on recheck. Patient is admitted to the family medicine service for further management of her chest pain and new oxygen  requirement.  Patient seen with attending, Dr. Rex Kras, who oversaw clinical decision making.     Margaretann Loveless, MD 05/21/15 [place], MD 05/23/15 [phone]

## 2015-05-20 NOTE — ED Notes (Signed)
MD at bedside. 

## 2015-05-20 NOTE — ED Notes (Signed)
BP 92/38 MAP 55, Dr. Armida Sans at bedside, pt alert and oriented x4, verbal order for NS 1liter bolus

## 2015-05-20 NOTE — ED Notes (Signed)
Dr. Rhina Brackett at bedside to speak with and assess pt and pt's daughter.

## 2015-05-21 ENCOUNTER — Other Ambulatory Visit: Payer: Self-pay | Admitting: Family Medicine

## 2015-05-21 ENCOUNTER — Encounter (HOSPITAL_COMMUNITY): Payer: Self-pay | Admitting: Cardiology

## 2015-05-21 DIAGNOSIS — I480 Paroxysmal atrial fibrillation: Secondary | ICD-10-CM

## 2015-05-21 DIAGNOSIS — Z7901 Long term (current) use of anticoagulants: Secondary | ICD-10-CM

## 2015-05-21 DIAGNOSIS — Z9861 Coronary angioplasty status: Secondary | ICD-10-CM

## 2015-05-21 DIAGNOSIS — R079 Chest pain, unspecified: Secondary | ICD-10-CM | POA: Diagnosis present

## 2015-05-21 DIAGNOSIS — Z950 Presence of cardiac pacemaker: Secondary | ICD-10-CM

## 2015-05-21 DIAGNOSIS — J449 Chronic obstructive pulmonary disease, unspecified: Secondary | ICD-10-CM

## 2015-05-21 DIAGNOSIS — I10 Essential (primary) hypertension: Secondary | ICD-10-CM

## 2015-05-21 DIAGNOSIS — I2510 Atherosclerotic heart disease of native coronary artery without angina pectoris: Secondary | ICD-10-CM

## 2015-05-21 DIAGNOSIS — N184 Chronic kidney disease, stage 4 (severe): Secondary | ICD-10-CM

## 2015-05-21 DIAGNOSIS — R072 Precordial pain: Secondary | ICD-10-CM | POA: Diagnosis not present

## 2015-05-21 HISTORY — DX: Chest pain, unspecified: R07.9

## 2015-05-21 LAB — TROPONIN I
Troponin I: <0.03 ng/mL (ref <0.031)
Troponin I: <0.03 ng/mL (ref <0.031)

## 2015-05-21 LAB — CBC
HCT: 29.6 % — ABNORMAL LOW (ref 36.0–46.0)
HEMOGLOBIN: 9.3 g/dL — AB (ref 12.0–15.0)
MCH: 31.2 pg (ref 26.0–34.0)
MCHC: 31.4 g/dL (ref 30.0–36.0)
MCV: 99.3 fL (ref 78.0–100.0)
PLATELETS: 188 10*3/uL (ref 150–400)
RBC: 2.98 MIL/uL — AB (ref 3.87–5.11)
RDW: 13.4 % (ref 11.5–15.5)
WBC: 7.4 10*3/uL (ref 4.0–10.5)

## 2015-05-21 LAB — BASIC METABOLIC PANEL
ANION GAP: 8 (ref 5–15)
BUN: 23 mg/dL — ABNORMAL HIGH (ref 6–20)
CHLORIDE: 110 mmol/L (ref 101–111)
CO2: 24 mmol/L (ref 22–32)
CREATININE: 1.47 mg/dL — AB (ref 0.44–1.00)
Calcium: 7.7 mg/dL — ABNORMAL LOW (ref 8.9–10.3)
GFR calc non Af Amer: 30 mL/min — ABNORMAL LOW (ref >60)
GFR, EST AFRICAN AMERICAN: 35 mL/min — AB (ref >60)
Glucose, Bld: 134 mg/dL — ABNORMAL HIGH (ref 65–99)
POTASSIUM: 4 mmol/L (ref 3.5–5.1)
SODIUM: 142 mmol/L (ref 135–145)

## 2015-05-21 MED ORDER — SODIUM CHLORIDE 0.9 % IV SOLN
INTRAVENOUS | Status: DC
  Administered 2015-05-21: 07:00:00 via INTRAVENOUS

## 2015-05-21 MED ORDER — TIOTROPIUM BROMIDE MONOHYDRATE 18 MCG IN CAPS
18.0000 ug | ORAL_CAPSULE | Freq: Every day | RESPIRATORY_TRACT | Status: DC
  Administered 2015-05-21: 18 ug via RESPIRATORY_TRACT
  Filled 2015-05-21: qty 5

## 2015-05-21 MED ORDER — FLUTICASONE FUROATE-VILANTEROL 200-25 MCG/INH IN AEPB
1.0000 | INHALATION_SPRAY | Freq: Every day | RESPIRATORY_TRACT | Status: DC
  Administered 2015-05-21: 1 via RESPIRATORY_TRACT
  Filled 2015-05-21: qty 28

## 2015-05-21 MED ORDER — LORAZEPAM 1 MG PO TABS
0.5000 mg | ORAL_TABLET | Freq: Three times a day (TID) | ORAL | Status: DC | PRN
Start: 2015-05-21 — End: 2015-05-21

## 2015-05-21 MED ORDER — LIDOCAINE 5 % EX PTCH
1.0000 | MEDICATED_PATCH | CUTANEOUS | Status: DC
  Filled 2015-05-21: qty 1

## 2015-05-21 MED ORDER — METOPROLOL SUCCINATE ER 25 MG PO TB24
12.5000 mg | ORAL_TABLET | Freq: Every day | ORAL | Status: DC
  Administered 2015-05-21: 12.5 mg via ORAL
  Filled 2015-05-21: qty 1

## 2015-05-21 MED ORDER — METOPROLOL SUCCINATE ER 25 MG PO TB24: 12.5000 mg | ORAL_TABLET | Freq: Every day | ORAL | Status: DC

## 2015-05-21 MED ORDER — PANTOPRAZOLE SODIUM 40 MG PO TBEC
40.0000 mg | DELAYED_RELEASE_TABLET | Freq: Every day | ORAL | Status: DC
  Administered 2015-05-21: 40 mg via ORAL
  Filled 2015-05-21: qty 1

## 2015-05-21 MED ORDER — TRAMADOL HCL 50 MG PO TABS
50.0000 mg | ORAL_TABLET | Freq: Four times a day (QID) | ORAL | Status: DC | PRN
  Administered 2015-05-21: 50 mg via ORAL
  Filled 2015-05-21: qty 1

## 2015-05-21 MED ORDER — APIXABAN 2.5 MG PO TABS
2.5000 mg | ORAL_TABLET | Freq: Two times a day (BID) | ORAL | Status: DC
  Administered 2015-05-21: 2.5 mg via ORAL
  Filled 2015-05-21 (×2): qty 1

## 2015-05-21 MED ORDER — ONDANSETRON HCL 4 MG/2ML IJ SOLN: 4.0000 mg | Freq: Four times a day (QID) | INTRAMUSCULAR | Status: DC | PRN

## 2015-05-21 MED ORDER — ALBUTEROL SULFATE (2.5 MG/3ML) 0.083% IN NEBU: 2.5000 mg | INHALATION_SOLUTION | Freq: Four times a day (QID) | RESPIRATORY_TRACT | Status: DC | PRN

## 2015-05-21 MED ORDER — ATORVASTATIN CALCIUM 10 MG PO TABS
20.0000 mg | ORAL_TABLET | Freq: Every day | ORAL | Status: DC
  Administered 2015-05-21: 20 mg via ORAL
  Filled 2015-05-21: qty 2

## 2015-05-21 MED ORDER — DICLOFENAC SODIUM 1 % TD GEL: 2.0000 g | Freq: Four times a day (QID) | TRANSDERMAL | Status: DC

## 2015-05-21 MED ORDER — AMIODARONE HCL 200 MG PO TABS
200.0000 mg | ORAL_TABLET | Freq: Every day | ORAL | Status: DC
  Administered 2015-05-21: 200 mg via ORAL
  Filled 2015-05-21: qty 1

## 2015-05-21 MED ORDER — SODIUM CHLORIDE 0.9 % IV BOLUS (SEPSIS)
1000.0000 mL | Freq: Once | INTRAVENOUS | Status: AC
  Administered 2015-05-21: 1000 mL via INTRAVENOUS

## 2015-05-21 MED ORDER — VITAMIN B-12 1000 MCG PO TABS
1000.0000 ug | ORAL_TABLET | Freq: Every day | ORAL | Status: DC
  Administered 2015-05-21: 1000 ug via ORAL
  Filled 2015-05-21: qty 1

## 2015-05-21 MED ORDER — VITAMIN D 1000 UNITS PO TABS
1000.0000 [IU] | ORAL_TABLET | Freq: Every day | ORAL | Status: DC
  Administered 2015-05-21: 1000 [IU] via ORAL
  Filled 2015-05-21: qty 1

## 2015-05-21 MED ORDER — NITROGLYCERIN 0.4 MG SL SUBL: 0.4000 mg | SUBLINGUAL_TABLET | SUBLINGUAL | Status: DC | PRN

## 2015-05-21 NOTE — ED Notes (Signed)
Admitting MD called regarding patient's BP. Cuff readjusted and BP at this time is 100/55. 2nd bolus still going. MD ordered a 3rd bolus following the 2nd should BP continue to drop. Will hold for now.

## 2015-05-21 NOTE — Consult Note (Signed)
Reason for Consult:   Chest pain  Requesting Physician: Dr Nori Riis Primary Cardiologist Dr Smith/ Dr Lovena Le  HPI:   Pleasant 81 y/o female, widowed x 20 yrs, lives in a historic home in [place], one of her 4 daughters is there with her when she is in town. The pt has a history of CAD- s/p RCA BMS in Aug 2010. At that time she had residual CFX disease treated medically. She had a Myoview in April 2016- low risk. Yesterday she was visiting with one of her daughters and mentioned she was having epigastric discomfort. She took a NTG without relief and the pt's daughter called 911. The pt tells me she had pain for at least 2 hours. She says its hard for her to describe. She denies SOB. Nausea, vomiting, or radiation to her arms or jaw. She has been pain free since last night. Troponin negative x 4.   PMHx:  Past Medical History  Diagnosis Date  . Candidiasis of the esophagus 11/26/2007  . Myocardial infarct, old   . COPD, severe   . Sick sinus syndrome with tachycardia ([place])   . Pacemaker   . Chronic kidney disease (CKD), stage III (moderate)   . Hypertension   . Acute appendicitis with rupture   . Spinal stenosis, lumbar   . Adrenal adenoma     Incidentaloma  . Lumbar herniated disc     History of HNP L4/5 in 2003  . Hx of colonoscopy with polypectomy 04/27/2010    Dr Cristina Gong found three  tubular adenomas each less than 10 mm size  . Retinal hemorrhage of left eye 06/2010  . Cataract 2013    Bilateral   . Abnormal mammogram, unspecified 08/23/2010    Followup imaging reassuring.  Repeat in 6 months.   Marland Kitchen DISC WITH RADICULOPATHY 04/27/2006    Qualifier: Diagnosis of  By: McDiarmid MD, Sherren Mocha    . Transitional cell carcinoma of ureter, history   . Mild cognitive impairment 10/26/2012    (10/25/12) Failed MiniCog screen  . 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 Normal uric acid serum level at time  of attack.    Marland Kitchen EDEMA-LEGS,DUE TO VENOUS OBSTRUCT. 04/27/2006    Qualifier: Diagnosis of  By: McDiarmid MD, Sherren Mocha    . ANXIETY 04/27/2006    Qualifier: Diagnosis of  By: McDiarmid MD, Sherren Mocha    . HERNIA, HIATAL, NONCONGENITAL 04/27/2006    Qualifier: Diagnosis of  By: McDiarmid MD, Sherren Mocha    . History of Hemorrhoids 04/27/2006    Qualifier: Diagnosis of  By: McDiarmid MD, Sherren Mocha    . RHINITIS, ALLERGIC 04/27/2006    Qualifier: Diagnosis of  By: McDiarmid MD, Sherren Mocha    . SCHATZKI'S RING, HX OF 11/26/2007    Qualifier: Diagnosis of  By: McDiarmid MD, Sherren Mocha  An EGD was performed by Dr Cristina Gong on 04/27/2010 for iron deficiency anemia. There was a a transient hiatal hernia with Schatzki's ring. Stomach and duodenum were normal. EGD on 03/06/12 by Dr Cristina Gong for IDA non-obstructing Schatzki's ring at Gastroesophageal junction, otherwise normal esophagus and stomach.    . Urge incontinence 12/13/2011    Diagnosed in 10/2011 by Dr Bjorn Loser (Urology)   . VITAMIN B12 DEFICIENCY 10/07/2009    Qualifier: Diagnosis of  By: McDiarmid MD, Sherren Mocha  Dx based on a post-TKR anemia work-up Low normal serum B12 with  high Methylmalonic acid and homocysteine level  Vit B12 serum level (10/28/10) > 1500 pg/mL   . Vitamin D deficiency 11/02/2010    Serum vitamin D 25(OH) = 10.9 ng/mL (30 -100) on 10/28/10 c/w Vitamin D deficiency.     . AF (paroxysmal atrial fibrillation) ([place]) 11/11/2010    Hospitalization (9/8-9/10, Dr Daneen Schick, III, Cardiology) for Paroxysmal Atrial Fibrillation with RVR and anginal pain secondary to demand/supply mismatch in setting of RVR with known circumflex artery branch disease.    . Iron deficiency anemia 08/05/2010    Dr Cristina Gong (GI) has evaluated with EGD, colonoscopy, and video capsular endoscopy in 2011 & 2012.  All have been unrevealing as to an origin of IDA.  OV with Dr Cristina Gong (10/28/10) assessment of blood in stool per hemoccult and GER. Hbg 12.1 g/dL, MCV 91.8, Ferritin 30 ng/mL. Patient taking on  ferrous sulfate tab daily.   EGD on 03/06/12 by Dr Cristina Gong for IDA non-obstructing Schatzki's ring at Gastroesophageal junction, otherwise normal esophagus and stomach.     . Macular degeneration, bilateral 10/04/2010    Right eye is wet MD, the other is dry macular degeneration (ARMD). Pt undergoing some form of vascular endothelial growth factor inhibition intraocular therapy.    . VENTRICULAR HYPERTROPHY, LEFT 08/28/2008    Qualifier: Diagnosis of  By: McDiarmid MD, Sherren Mocha    . COPD 04/27/2006    Qualifier: Diagnosis of  By: McDiarmid MD, Sherren Mocha    . Solitary kidney, acquired 05/17/2010    Surgical removal for transitional cell cancer by Tresa Endo, MD ([facility]). Surveillance cystoscopy by Dr Alinda Money [facility]) on 10/19/12 without evidence of cystoscopic recurrence. Recommend RTC one year for cystoscopy.   . ADENOMATOUS COLONIC POLYP 03/01/2003    Qualifier: Diagnosis of  By: McDiarmid MD, Sherren Mocha Multiple benign polyps of cecum, ascending, transverse and sigmoid colon by 8/09 colonoscopy by Dr Cristina Gong  Colonoscopy by Dr Cristina Gong for iron-deficiency anemia on 04/27/2010 showed three sessile polyps that were in ascending (3 mm x 9 mm), transverse (4 mm), and cecum (3 mm).  All three were tubular adenomas that were negative for high grade dysplasia or malignancy on pathology. Dr Cristina Gong called the polpys benign and not requiring follow-up in view of the patients age.    . Soft tissue injury of foot 05/03/2011  . PREDIABETES 09/11/2007    Qualifier: Diagnosis of  By: McDiarmid MD, Sherren Mocha    . Numbness and tingling in hands 07/21/2011  . MUSCLE CRAMPS 03/11/2010    Qualifier: Diagnosis of  By: McDiarmid MD, Sherren Mocha    . Leg cramps 05/29/2012  . Cellulitis of leg, right 10/01/2012  . Solar lentigo 06/15/2012  . Muscle spasm of back 09/24/2013  . Shoulder pain, left 01/09/2014  . Seborrheic keratosis, right anterior thigh 12/12/2013  . High risk medications (not anticoagulants) long-term use 03/05/2012  . CAP (community  acquired pneumonia) 02/25/2015  . Decreased functional mobility and endurance 03/17/2015    Past Surgical History  Procedure Laterality Date  . Pacemaker insertion      Dr Lovena Le ([facility])  . Nephrectomy  For transition cell cancer     Dr Tresa Endo, surgeon  . Replacement total knee  2009, Right knee    Dr Wynelle Link  . Replacement total knee  05/2009, Left knee    Dr Wynelle Link  . Knee arthroscopy w/ synovectomy  11/2009, left knee    Dr Wynelle Link  . Cholecystectomy    . Esophagogastroduodenoscopy  04/27/2010    Dr Cristina Gong - found  transient H/H & Schatzki's ring  . Esophagogastroduodenoscopy endoscopy  03/06/2012    Dr Cristina Gong - found non-obstuctive Schatzki's ring at [employer] [facility]. o/w normal EGD.   Marland Kitchen Pacemaker generator change N/A 11/12/2012    Procedure: PACEMAKER GENERATOR CHANGE;  Surgeon: Evans Lance, MD;  [facility] C[facility] LAB;  Service: Cardiovascular;  Laterality: N/A;  . Tonsillectomy    . Breast surgery      breast reduction  . Eye surgery      bilateral cataracts  . Trigger finger release Right 05/22/2014    Procedure: RIGHT HAND A-1 PULLEY RELEASE ;  Surgeon: Roseanne Kaufman, MD;  Location: [place];  Service: Orthopedics;  Laterality: Right;  . Dupuytren contracture release Right 05/22/2014    Procedure: DUPUYTREN RELEASE AND REPAIR AS NECESSARY RIGHT RING FINGER AND MIDDLE FINGER;  Surgeon: Roseanne Kaufman, MD;  Location: [place];  Service: Orthopedics;  Laterality: Right;    SOCHx:  reports that she has quit smoking. Her smoking use included Cigarettes. She has never used smokeless tobacco. She reports that she drinks about 12.0 oz of alcohol per week. She reports that she does not use illicit drugs.  FAMHx: Family History  Problem Relation Age of Onset  . Dementia Mother   . Heart disease Father     ALLERGIES: Allergies  Allergen Reactions  . Baclofen Other (See Comments)    Confusion occurred with taking 3 at the same time.  . Codeine Phosphate Nausea Only  . Norco  [Hydrocodone-Acetaminophen] Nausea And Vomiting  . Hydrochlorothiazide Other (See Comments)    Possible acute gouty arthritis of wrist  . Montelukast Sodium Other (See Comments)    Unspecified reaction.     ROS: Review of Systems: General: negative for chills, fever, night sweats or weight changes.  Cardiovascular: negative for chest pain, dyspnea on exertion, edema, orthopnea, palpitations, paroxysmal nocturnal dyspnea or shortness of breath HEENT: negative for any visual disturbances, blindness, glaucoma Dermatological: negative for rash Respiratory: negative for cough, hemoptysis, or wheezing Urologic: negative for hematuria or dysuria Abdominal: negative for nausea, vomiting, diarrhea, bright red blood per rectum, melena, or hematemesis Neurologic: negative for visual changes, syncope, or dizziness Musculoskeletal: negative for back pain, joint pain, or swelling Psych: cooperative and appropriate All other systems reviewed and are otherwise negative except as noted above.   HOME MEDICATIONS: Prior to Admission medications   Medication Sig Start Date End Date Taking? Authorizing Provider  albuterol (ACCUNEB) 1.25 MG/3ML nebulizer solution Take 1 ampule by nebulization every 6 (six) hours as needed for wheezing or shortness of breath.   Yes Historical Provider, MD  albuterol (VENTOLIN HFA) 108 (90 BASE) MCG/ACT inhaler Inhale 2 puffs into the lungs every 6 (six) hours as needed. If needed for shortness of breath. 02/14/14  Yes Blane Ohara McDiarmid, MD  amiodarone (PACERONE) 200 MG tablet Take 1 tablet (200 mg total) by mouth daily. 05/18/15  Yes Evans Lance, MD  apixaban (ELIQUIS) 2.5 MG TABS tablet Take 1 tablet (2.5 mg total) by mouth 2 (two) times daily. 05/18/15  Yes Blane Ohara McDiarmid, MD  atorvastatin (LIPITOR) 20 MG tablet Take 1 tablet (20 mg total) by mouth daily. 04/17/15  Yes Evans Lance, MD  cholecalciferol (VITAMIN D) 1000 UNITS tablet Take 1,000 Units by mouth daily.   Yes  Historical Provider, MD  Fluticasone-Salmeterol (ADVAIR DISKUS) 250-50 MCG/DOSE AEPB Inhale 2 puffs into the lungs 2 (two) times daily. 03/05/15  Yes Mercy Riding, MD  furosemide (LASIX) 40 MG tablet TAKE  1 TABLET BY MOUTH EVERY MORNING 04/23/15  Yes Todd D McDiarmid, MD  LORazepam (ATIVAN) 0.5 MG tablet TAKE 1 TABLET EVERY 8 HOURS AS NEEDED FOR ANXIETY OR SHORTNESS OF BREATH. 05/18/15  Yes Blane Ohara McDiarmid, MD  metoprolol succinate (TOPROL-XL) 50 MG 24 hr tablet Take 1 tablet (50 mg total) by mouth daily. with food 06/01/14  Yes Almyra Deforest, PA  nitroGLYCERIN (NITROSTAT) 0.4 MG SL tablet PLACE 1 TABLET UNDER THE TONGUE EVERY 5 MINUTES AS NEEDED FOR CHEST PAIN 04/17/15  Yes Evans Lance, MD  omeprazole (PRILOSEC) 20 MG capsule Take 1 capsule (20 mg total) by mouth daily. 11/07/14  Yes Blane Ohara McDiarmid, MD  tiotropium (SPIRIVA) 18 MCG inhalation capsule Place 1 capsule (18 mcg total) into inhaler and inhale daily. 01/15/15  Yes Blane Ohara McDiarmid, MD  traMADol (ULTRAM) 50 MG tablet Take 1 tablet (50 mg total) by mouth every 6 (six) hours as needed for moderate pain. 11/17/14  Yes Sela Hua, MD  vitamin B-12 (CYANOCOBALAMIN) 1000 MCG tablet Take 1,000 mcg by mouth daily.    Yes Historical Provider, MD  azithromycin (ZITHROMAX) 250 MG tablet Take 2 tabs day 1, then 1 tab daily Patient not taking: Reported on 05/20/2015 05/09/15   Blane Ohara McDiarmid, MD    HOSPITAL MEDICATIONS: I have reviewed the patient's current medications.  VITALS: Blood pressure 115/60, pulse 61, temperature 97.3 F (36.3 C), temperature source Oral, resp. rate 16, SpO2 92 %.  PHYSICAL EXAM: General appearance: alert, cooperative and no distress Neck: no carotid bruit and no JVD Lungs: clear to auscultation bilaterally Heart: regular rate and rhythm Abdomen: soft, non-tender; bowel sounds normal; no masses,  no organomegaly and RUQ surgical scar Extremities: extremities normal, atraumatic, no cyanosis or edema Pulses: pulses  1+ Skin: Skin color, texture, turgor normal. No rashes or lesions Neurologic: Grossly normal  LABS: Results for orders placed or performed during the hospital encounter of 05/20/15 (from the past 24 hour(s))  Basic metabolic panel     Status: Abnormal   Collection Time: 05/20/15 10:28 PM  Result Value Ref Range   Sodium 140 135 - 145 mmol/L   Potassium 3.8 3.5 - 5.1 mmol/L   Chloride 107 101 - 111 mmol/L   CO2 26 22 - 32 mmol/L   Glucose, Bld 131 (H) 65 - 99 mg/dL   BUN 23 (H) 6 - 20 mg/dL   Creatinine, Ser 1.55 (H) 0.44 - 1.00 mg/dL   Calcium 8.3 (L) 8.9 - 10.3 mg/dL   GFR calc non Af Amer 29 (L) >60 mL/min   GFR calc Af Amer 33 (L) >60 mL/min   Anion gap 7 5 - 15  CBC     Status: Abnormal   Collection Time: 05/20/15 10:28 PM  Result Value Ref Range   WBC 8.4 4.0 - 10.5 K/uL   RBC 3.26 (L) 3.87 - 5.11 MIL/uL   Hemoglobin 9.8 (L) 12.0 - 15.0 g/dL   HCT 31.9 (L) 36.0 - 46.0 %   MCV 97.9 78.0 - 100.0 fL   MCH 30.1 26.0 - 34.0 pg   MCHC 30.7 30.0 - 36.0 g/dL   RDW 13.3 11.5 - 15.5 %   Platelets 227 150 - 400 K/uL  Protime-INR - (order if Patient is taking Coumadin / Warfarin)     Status: Abnormal   Collection Time: 05/20/15 10:28 PM  Result Value Ref Range   Prothrombin Time 17.8 (H) 11.6 - 15.2 seconds   INR 1.46 0.00 - 1.49  I-stat troponin, ED (not at [facility])     Status: None   Collection Time: 05/20/15 10:36 PM  Result Value Ref Range   Troponin i, poc 0.02 0.00 - 0.08 ng/mL   Comment 3          Basic metabolic panel     Status: Abnormal   Collection Time: 05/21/15  3:55 AM  Result Value Ref Range   Sodium 142 135 - 145 mmol/L   Potassium 4.0 3.5 - 5.1 mmol/L   Chloride 110 101 - 111 mmol/L   CO2 24 22 - 32 mmol/L   Glucose, Bld 134 (H) 65 - 99 mg/dL   BUN 23 (H) 6 - 20 mg/dL   Creatinine, Ser 1.47 (H) 0.44 - 1.00 mg/dL   Calcium 7.7 (L) 8.9 - 10.3 mg/dL   GFR calc non Af Amer 30 (L) >60 mL/min   GFR calc Af Amer 35 (L) >60 mL/min   Anion gap 8 5 - 15  CBC      Status: Abnormal   Collection Time: 05/21/15  3:55 AM  Result Value Ref Range   WBC 7.4 4.0 - 10.5 K/uL   RBC 2.98 (L) 3.87 - 5.11 MIL/uL   Hemoglobin 9.3 (L) 12.0 - 15.0 g/dL   HCT 29.6 (L) 36.0 - 46.0 %   MCV 99.3 78.0 - 100.0 fL   MCH 31.2 26.0 - 34.0 pg   MCHC 31.4 30.0 - 36.0 g/dL   RDW 13.4 11.5 - 15.5 %   Platelets 188 150 - 400 K/uL  Troponin I-serum (0, 3, 6 hours)     Status: None   Collection Time: 05/21/15  4:00 AM  Result Value Ref Range   Troponin I <0.03 <0.031 ng/mL  Troponin I-serum (0, 3, 6 hours)     Status: None   Collection Time: 05/21/15  6:22 AM  Result Value Ref Range   Troponin I <0.03 <0.031 ng/mL  Troponin I-serum (0, 3, 6 hours)     Status: None   Collection Time: 05/21/15  9:33 AM  Result Value Ref Range   Troponin I <0.03 <0.031 ng/mL    EKG: NSR, 1st degree AVB on adm- now paced on telemetry  IMAGING: Dg Chest 2 View  05/20/2015  CLINICAL DATA:  Midsternal chest pain and dyspnea today. One episode of emesis. Former smoker. History of COPD and atrial fibrillation. EXAM: CHEST  2 VIEW COMPARISON:  03/04/2015 FINDINGS: To lead pacemaker remains in place. The cardiac silhouette remains slightly enlarged. Thoracic aortic atherosclerosis is noted. The lungs remain hyperinflated with chronic interstitial coarsening. Opacity in the left mid to lower lung is unchanged and most likely reflects scarring. No acute osseous abnormality is identified. IMPRESSION: No active cardiopulmonary disease. Electronically Signed   By: Logan Bores M.D.   On: 05/20/2015 22:15    IMPRESSION: Principal Problem:   Chest pain with moderate risk of acute coronary syndrome Active Problems:   CAD S/P RCA BMS, residual CFX disease   COPD, severe (HCC)   Chronic kidney disease (CKD), stage IV (severe) (HCC)   PAF- CHADs VASc= 6     HYPERTENSION, BENIGN ESSENTIAL   Chronic anticoagulation   Pacemaker   Diastolic dysfunction   RECOMMENDATION: Will review with MD. Pt in ED  overnight secondary to no beds. She says she feels well this am and wants to go home. Multiple reason to not pursue further work up including pt's desire to not be aggressive. She is not on a nitrate but  she did say SL NTG didn't help.   Time Spent Directly with Patient: 45 minutes  Kerin Ransom, [place] beeper 05/21/2015, 10:19 AM

## 2015-05-21 NOTE — ED Notes (Signed)
Provided patient with [place] sandwich and decaf coffee.

## 2015-05-21 NOTE — ED Notes (Signed)
Breakfast tray ordered for patient.

## 2015-05-21 NOTE — ED Notes (Signed)
Admitting MD at bedside.

## 2015-05-21 NOTE — Discharge Instructions (Signed)
We have decreased your Metoprolol medication at discharge because your blood pressure and heart rate was borderline low. Dr. McDiarmid may need to adjust this medication at your follow up.  Don't take your Lasix until your follow up appointment. Drink plenty of fluids to help with the small kidney injury you had during your hospitalization.   If you have chest pain, shortness of breath, or pain radiating to your jaw or arm you need to be seen again.   We have prescribed a gel to put on your sore shoulder. Please follow up with Dr. McDiarmid if you continue to have pain in this area.

## 2015-05-21 NOTE — H&P (Signed)
[facility] Admission History and Physical Service Pager: [phone]  Patient name: [name] Medical record number: [id number] Date of birth: 01-28-1924 Age: 82 y.o. Gender: female  Primary Care Provider: MCDIARMID,TODD D, MD Consultants: None Code Status: Full  Chief Complaint: CP  Assessment and Plan: [name] is a 82 y.o. female presenting with chest pain 1 day. PMH is significant for COPD, AFib, HTN, COPD, CKD Stage III, sick sinus syndrome w/ pacemaker, and CAD s/p PCI.   Chest pain: Heart score 6 on admission. Patient endorsing 1 day of substernal chest pressure/tightness. Not relieved with rest. Not reproducible with palpation. Nitroglycerin resolved pain. Patient's significant cardiac history leaves high suspicion for cardiac etiology. Differential includes pneumonia (CXR clear), fluid overload (patient endorsed missing Lasix dose 3 days), GERD, or MSK (pain not reproducible). Patient states that pain was very similar to when she had experienced a previous MI. Not currently experiencing chest pain on admission. EKG unchanged from previous. Troponin 0.02. Patient in no acute distress. - Bring in for observation under the care of family medicine teaching service; attending physician Dr. Nori Riis - Telemetry - Consult cardiology in morning - Cycle troponins - Follow-up morning EKG - Supplemental O2 as needed. - Holding metoprolol secondary to hypotension; continue amiodarone. - Will reassess in a.m.  H/o AFib and Sick Sinus Syndrome: Pacemaker in place; paced rhythm. No evidence of Afib in ED. On long term coumadin for Afib in the past. Recently changed to Eliquis.   - continue amiodarone 200 mg daily - Hold metoprolol XL 50 mg daily due to BP - continue home Eliquis  CAD s/p PCI with h/o MI - continue atorvastatin 20 mg daily   COPD: No longer on supplemental O2 at home (recent development per patient). Required 4L initially. This improved with  time. On 1L [place] on admission. Sats in high 90's. No evidence of respiratory distress. - Plan to wean off of supplemental O2 tomorrow as tolerated - Albuterol inhaler every 6 hours when necessary - Substitute Breo inhaler daily for Advair Diskus twice a day. - Continue home Spiriva - Continue to monitor   CKD Stage III: Solitary kidney. Patient has h/o transitional cell cancer. Baseline Cr ~1.3. Cr of 1.55 at admission suggesting slight bump.  - BMET in AM  - Monitor Cr   H/o HTN: Currently hypotensive at admission. Provided 3L boluses in ED. - Continue to monitor  - Holding Metoprolol XL 50 mg daily   Anxiety: Stable.  - continue home Ativan 0.5 mg q6h prn   Osteoarthritis:  - continue Tramadol 50 mg q6h prn   FEN/GI: Heart healthy diet, IV fluids @ 51ml/hr Prophylaxis: Continue anticoagulation with home Eliquis  Disposition: home when stable  History of Present Illness:  [name] is a 82 y.o. female presenting with chest pain 1 day. Patient states that she woke up this morning with sensation of chest pressure/tightness. She states that this discomfort persisted throughout the day. She initially felt as though it may have been something she ate but when it did not subside as the day progressed she felt it may be more serious. Pain was noted to be substernal and pressure/tightness in nature. She noted that it was experienced during both ambulation and at rest. Nothing seemed to make it worse or better. She denied any headache, blurred vision, fatigue, diaphoresis, fever, chills, abdominal pain, dysuria, diarrhea, or constipation. She did endorse some nausea without vomiting earlier in the day which had since resolved.  In the ED patient received supplemental O2 secondary to decreased O2 saturation to the mid 80s. Saturations corrected quickly with 4 L. Patient was able to be slowly weaned down to 1 L O2. She is also provided and show blistering by EMS which she states resolved her  chest pain. At the time of admission patient was pain-free and resting comfortably.  Review Of Systems: Per HPI Otherwise the remainder of the systems were negative.  Patient Active Problem List   Diagnosis Date Noted  . Chest pain 05/21/2015  . Diastolic dysfunction   . History of iron deficiency 01/16/2015  . Pure hypercholesterolemia 07/31/2014  . Encounter for therapeutic drug monitoring 06/19/2014  . CAD (coronary artery disease), native coronary artery 06/08/2014  . On warfarin therapy 06/01/2014  . Pacemaker 04/02/2013  . Mild cognitive impairment 10/26/2012  . Transitional cell carcinoma of ureter, history   . Chronic anticoagulation 10/01/2012  . Urge incontinence 12/13/2011  . AF (paroxysmal atrial fibrillation) ([place]) 11/11/2010  . Vitamin D deficiency 11/02/2010  . Macular degeneration, bilateral 10/04/2010    Class: Chronic  . Solitary kidney, acquired 05/17/2010  . Spondylolisthesis of lumbar region   . VITAMIN B12 DEFICIENCY 10/07/2009  . Chronic kidney disease (CKD), stage IV (severe) ([place]) 09/02/2009  . MYOCARDIAL INFARCTION, HX OF 09/25/2008  . VENTRICULAR HYPERTROPHY, LEFT 08/28/2008  . At risk for diabetes mellitus 09/11/2007  . HYPERTENSION, BENIGN ESSENTIAL 06/18/2007  . ANXIETY 04/27/2006  . SICK SINUS SYNDROME 04/27/2006  . COPD, severe ([place]) 04/27/2006  . GASTROESOPHAGEAL REFLUX, NO ESOPHAGITIS 04/27/2006  . Osteoarthrosis involving lower leg 04/27/2006  . OSTEOARTHRITIS OF SPINE, NOS 04/27/2006  . LUMBAR SPINAL STENOSIS 04/27/2006    Past Medical History: Past Medical History  Diagnosis Date  . Candidiasis of the esophagus 11/26/2007  . Myocardial infarct, old   . COPD, severe   . Sick sinus syndrome with tachycardia ([place])   . Pacemaker   . Chronic kidney disease (CKD), stage III (moderate)   . Hypertension   . Acute appendicitis with rupture   . Spinal stenosis, lumbar   . Adrenal adenoma     Incidentaloma  . Spondylolisthesis of lumbar  region   . Lumbar herniated disc     History of HNP L4/5 in 2003  . Hx of colonoscopy with polypectomy 04/27/2010    Dr Cristina Gong found three  tubular adenomas each less than 10 mm size  . Retinal hemorrhage of left eye 06/2010  . Cataract 2013    Bilateral   . Abnormal mammogram, unspecified 08/23/2010    Followup imaging reassuring.  Repeat in 6 months.   Marland Kitchen DISC WITH RADICULOPATHY 04/27/2006    Qualifier: Diagnosis of  By: McDiarmid MD, Sherren Mocha    . Transitional cell carcinoma of ureter, history   . Mild cognitive impairment 10/26/2012    (10/25/12) Failed MiniCog screen  . 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 Normal uric acid serum level at time of attack.    Marland Kitchen COPD (chronic obstructive pulmonary disease) ([place])   . EDEMA-LEGS,DUE TO VENOUS OBSTRUCT. 04/27/2006    Qualifier: Diagnosis of  By: McDiarmid MD, Sherren Mocha    . ANXIETY 04/27/2006    Qualifier: Diagnosis of  By: McDiarmid MD, Sherren Mocha    . HERNIA, HIATAL, NONCONGENITAL 04/27/2006    Qualifier: Diagnosis of  By: McDiarmid MD, Sherren Mocha    . History of Hemorrhoids 04/27/2006    Qualifier: Diagnosis  of  By: McDiarmid MD, Sherren Mocha    . RHINITIS, ALLERGIC 04/27/2006    Qualifier: Diagnosis of  By: McDiarmid MD, Sherren Mocha    . SCHATZKI'S RING, HX OF 11/26/2007    Qualifier: Diagnosis of  By: McDiarmid MD, Sherren Mocha  An EGD was performed by Dr Cristina Gong on 04/27/2010 for iron deficiency anemia. There was a a transient hiatal hernia with Schatzki's ring. Stomach and duodenum were normal. EGD on 03/06/12 by Dr Cristina Gong for IDA non-obstructing Schatzki's ring at Gastroesophageal junction, otherwise normal esophagus and stomach.    . Urge incontinence 12/13/2011    Diagnosed in 10/2011 by Dr Bjorn Loser (Urology)   . VITAMIN B12 DEFICIENCY 10/07/2009    Qualifier: Diagnosis of  By: McDiarmid MD, Sherren Mocha  Dx based on a post-TKR anemia work-up Low normal serum B12 with high Methylmalonic acid and  homocysteine level  Vit B12 serum level (10/28/10) > 1500 pg/mL   . Vitamin D deficiency 11/02/2010    Serum vitamin D 25(OH) = 10.9 ng/mL (30 -100) on 10/28/10 c/w Vitamin D deficiency.     . AF (paroxysmal atrial fibrillation) ([place]) 11/11/2010    Hospitalization (9/8-9/10, Dr Daneen Schick, III, Cardiology) for Paroxysmal Atrial Fibrillation with RVR and anginal pain secondary to demand/supply mismatch in setting of RVR with known circumflex artery branch disease.    Marland Kitchen HYPERTENSION, BENIGN ESSENTIAL 06/18/2007    Qualifier: Diagnosis of  By: McDiarmid MD, Sherren Mocha    . Iron deficiency anemia 08/05/2010    Dr Cristina Gong (GI) has evaluated with EGD, colonoscopy, and video capsular endoscopy in 2011 & 2012.  All have been unrevealing as to an origin of IDA.  OV with Dr Cristina Gong (10/28/10) assessment of blood in stool per hemoccult and GER. Hbg 12.1 g/dL, MCV 91.8, Ferritin 30 ng/mL. Patient taking on ferrous sulfate tab daily.   EGD on 03/06/12 by Dr Cristina Gong for IDA non-obstructing Schatzki's ring at Gastroesophageal junction, otherwise normal esophagus and stomach.     . Macular degeneration, bilateral 10/04/2010    Right eye is wet MD, the other is dry macular degeneration (ARMD). Pt undergoing some form of vascular endothelial growth factor inhibition intraocular therapy.    . VENTRICULAR HYPERTROPHY, LEFT 08/28/2008    Qualifier: Diagnosis of  By: McDiarmid MD, Sherren Mocha    . CHRONIC KIDNEY DISEASE STAGE III (MODERATE) 09/02/2009    Patient with Solitary Kidney S/P total Nephrectomy for transitional cell caner.    Marland Kitchen COPD 04/27/2006    Qualifier: Diagnosis of  By: McDiarmid MD, Sherren Mocha    . MYOCARDIAL INFARCTION, HX OF 09/25/2008    Qualifier: Diagnosis of  By: McDiarmid MD, Todd  Hospitalized for NSTEMI ( 10/02/2008). PCI (10/02/2008, Dr Daneen Schick) with RCA [place] placed: 99% lesion to 0% lesion. Circumflex Obtuse marginal with 90% stenosis (left for medical mangement for now)   Hospitalization (9/8-9/10, Dr Daneen Schick,  III, Cardiology) for Paroxysmal Atrial Fibrillation with RVR and anginal pain secondary to demand/supply mismatch in setting of RVR with known circumflex artery branch disease.    Marland Kitchen SICK SINUS SYNDROME 04/27/2006    Qualifier: Diagnosis of  By: McDiarmid MD, Sherren Mocha  S/P dual chamber pacemaker placement by Dr Osie Cheeks ([facility]) with pacemaker lead extraction & reinsertion - 07/25/2003  Hospitalization (9/8-9/10, Dr Daneen Schick, III, Cardiology) for Paroxysmal Atrial Fibrillation with RVR and anginal pain secondary to demand/supply mismatch in setting of RVR with known circumflex artery branch disease.    . Solitary kidney, acquired 05/17/2010    Surgical removal  for transitional cell cancer by Tresa Endo, MD ([facility]). Surveillance cystoscopy by Dr Alinda Money [facility]) on 10/19/12 without evidence of cystoscopic recurrence. Recommend RTC one year for cystoscopy.   . ADENOMATOUS COLONIC POLYP 03/01/2003    Qualifier: Diagnosis of  By: McDiarmid MD, Sherren Mocha Multiple benign polyps of cecum, ascending, transverse and sigmoid colon by 8/09 colonoscopy by Dr Cristina Gong  Colonoscopy by Dr Cristina Gong for iron-deficiency anemia on 04/27/2010 showed three sessile polyps that were in ascending (3 mm x 9 mm), transverse (4 mm), and cecum (3 mm).  All three were tubular adenomas that were negative for high grade dysplasia or malignancy on pathology. Dr Cristina Gong called the polpys benign and not requiring follow-up in view of the patients age.    . Soft tissue injury of foot 05/03/2011  . PREDIABETES 09/11/2007    Qualifier: Diagnosis of  By: McDiarmid MD, Sherren Mocha    . Numbness and tingling in hands 07/21/2011  . MUSCLE CRAMPS 03/11/2010    Qualifier: Diagnosis of  By: McDiarmid MD, Sherren Mocha    . Mammogram abnormal 02/15/2011  . LUMBAR SPINAL STENOSIS 04/27/2006    Qualifier: Diagnosis of  By: McDiarmid MD, Francisca December HNP w/ L4&5 root encroachment - 05/24/2001,   Spinal Stenosis: L-S MRI: L4-5 Spinal stenosis, - 03/03/2006,  Spondylolithesis L5-S1(mild), DJD L4-5 on X-Ray 11/04   . Leg cramps 05/29/2012  . 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 Normal uric acid serum level at time of attack.    . Cellulitis of leg, right 10/01/2012  . Solar lentigo 06/15/2012  . Muscle spasm of back 09/24/2013  . 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  No mass lesion seen. Likely cause of patients 2 to 3 episodes of bleeding from patient's right eye.    . Shoulder pain, left 01/09/2014  . Seborrheic keratosis, right anterior thigh 12/12/2013  . High risk medications (not anticoagulants) long-term use 03/05/2012  . COPD, severe ([place]) 04/27/2006    Qualifier: Diagnosis of  By: McDiarmid MD, Sherren Mocha    . Wrist pain, left 02/12/2015  . COPD exacerbation ([place])   . CAP (community acquired pneumonia) 02/25/2015  . Decreased functional mobility and endurance 03/17/2015    Past Surgical History: Past Surgical History  Procedure Laterality Date  . Pacemaker insertion      Dr Lovena Le ([facility])  . Nephrectomy  For transition cell cancer     Dr Tresa Endo, surgeon  . Replacement total knee  2009, Right knee    Dr Wynelle Link  . Replacement total knee  05/2009, Left knee    Dr Wynelle Link  . Knee arthroscopy w/ synovectomy  11/2009, left knee    Dr Wynelle Link  . Cholecystectomy    . Esophagogastroduodenoscopy  04/27/2010    Dr Cristina Gong - found transient H/H & Schatzki's ring  . Esophagogastroduodenoscopy endoscopy  03/06/2012    Dr Cristina Gong - found non-obstuctive Schatzki's ring at [employer] [facility]. o/w normal EGD.   Marland Kitchen Pacemaker generator change N/A 11/12/2012    Procedure: PACEMAKER GENERATOR CHANGE;  Surgeon: Evans Lance, MD;  Location: [facility] CATH LAB;  Service:  Cardiovascular;  Laterality: N/A;  . Tonsillectomy    . Breast surgery      breast reduction  .  Eye surgery      bilateral cataracts  . Trigger finger release Right 05/22/2014    Procedure: RIGHT HAND A-1 PULLEY RELEASE ;  Surgeon: Roseanne Kaufman, MD;  Location: [place];  Service: Orthopedics;  Laterality: Right;  . Dupuytren contracture release Right 05/22/2014    Procedure: DUPUYTREN RELEASE AND REPAIR AS NECESSARY RIGHT RING FINGER AND MIDDLE FINGER;  Surgeon: Roseanne Kaufman, MD;  Location: [place];  Service: Orthopedics;  Laterality: Right;    Social History: Social History  Substance Use Topics  . Smoking status: Former Smoker    Types: Cigarettes  . Smokeless tobacco: Never Used  . Alcohol Use: 12.0 oz/week    14 Glasses of wine, 6 Standard drinks or equivalent per week     Comment: 4-5 glasses of wine per week   Additional social history: none  Please also refer to relevant sections of EMR.  Family History: Family History  Problem Relation Age of Onset  . Dementia Mother   . Heart disease Father     Allergies and Medications: Allergies  Allergen Reactions  . Baclofen Other (See Comments)    Confusion occurred with taking 3 at the same time.  . Codeine Phosphate Nausea Only  . Norco [Hydrocodone-Acetaminophen] Nausea And Vomiting  . Hydrochlorothiazide Other (See Comments)    Possible acute gouty arthritis of wrist  . Montelukast Sodium Other (See Comments)    Unspecified reaction.    No current facility-administered medications on file prior to encounter.   Current Outpatient Prescriptions on File Prior to Encounter  Medication Sig Dispense Refill  . albuterol (ACCUNEB) 1.25 MG/3ML nebulizer solution Take 1 ampule by nebulization every 6 (six) hours as needed for wheezing or shortness of breath.    Marland Kitchen albuterol (VENTOLIN HFA) 108 (90 BASE) MCG/ACT inhaler Inhale 2 puffs into the lungs every 6 (six) hours as needed. If needed for shortness of breath. 1 Inhaler 3  .  amiodarone (PACERONE) 200 MG tablet Take 1 tablet (200 mg total) by mouth daily. 90 tablet 3  . apixaban (ELIQUIS) 2.5 MG TABS tablet Take 1 tablet (2.5 mg total) by mouth 2 (two) times daily. 60 tablet 5  . atorvastatin (LIPITOR) 20 MG tablet Take 1 tablet (20 mg total) by mouth daily. 30 tablet 11  . cholecalciferol (VITAMIN D) 1000 UNITS tablet Take 1,000 Units by mouth daily.    . Fluticasone-Salmeterol (ADVAIR DISKUS) 250-50 MCG/DOSE AEPB Inhale 2 puffs into the lungs 2 (two) times daily. 60 each 11  . furosemide (LASIX) 40 MG tablet TAKE 1 TABLET BY MOUTH EVERY MORNING 30 tablet 5  . LORazepam (ATIVAN) 0.5 MG tablet TAKE 1 TABLET EVERY 8 HOURS AS NEEDED FOR ANXIETY OR SHORTNESS OF BREATH. 30 tablet 5  . metoprolol succinate (TOPROL-XL) 50 MG 24 hr tablet Take 1 tablet (50 mg total) by mouth daily. with food 90 tablet 3  . nitroGLYCERIN (NITROSTAT) 0.4 MG SL tablet PLACE 1 TABLET UNDER THE TONGUE EVERY 5 MINUTES AS NEEDED FOR CHEST PAIN 100 tablet 1  . omeprazole (PRILOSEC) 20 MG capsule Take 1 capsule (20 mg total) by mouth daily. 90 capsule 3  . tiotropium (SPIRIVA) 18 MCG inhalation capsule Place 1 capsule (18 mcg total) into inhaler and inhale daily. 30 capsule PRN  . traMADol (ULTRAM) 50 MG tablet Take 1 tablet (50 mg total) by mouth every 6 (six) hours as needed for moderate pain. 30 tablet 1  . vitamin B-12 (CYANOCOBALAMIN) 1000 MCG tablet Take 1,000 mcg by mouth daily.     Marland Kitchen  azithromycin (ZITHROMAX) 250 MG tablet Take 2 tabs day 1, then 1 tab daily (Patient not taking: Reported on 05/20/2015) 6 each 1    Objective: BP 100/55 mmHg  Pulse 61  Temp(Src) 97.3 F (36.3 C) (Oral)  Resp 18  SpO2 98% Exam: General -- oriented x3, pleasant and cooperative. Elderly HEENT -- Head is normocephalic. PERRLA. EOMI. Ears, nose and throat were benign. Neck -- supple; no bruits. No JVD, no LAD Chest -- good expansion. Some very soft wheezes noted bilaterally. No reproducible chest discomfort  with palpation. Cardiac -- RRR. No murmurs noted. (Paced rhythm) Abdomen -- soft, nontender. No masses palpable. Bowel sounds present. CNS -- cranial nerves II through XII grossly intact. 2+ reflexes bilaterally. Extremeties - no tenderness or effusions noted. ROM good. 5/5 bilateral strength. Dorsalis pedis pulses present and symmetrical.    Labs and Imaging: CBC BMET   Recent Labs Lab 05/20/15 2228  WBC 8.4  HGB 9.8*  HCT 31.9*  PLT 227    Recent Labs Lab 05/20/15 2228  NA 140  K 3.8  CL 107  CO2 26  BUN 23*  CREATININE 1.55*  GLUCOSE 131*  CALCIUM 8.3*       Elberta Leatherwood, MD 05/21/2015, 2:59 AM PGY-2, [place] Intern pager: [phone], text pages welcome

## 2015-05-21 NOTE — Discharge Summary (Signed)
[name] Medical record number: [id number] Date of birth: 1924/02/21 Age: 82 y.o. Gender: female Date of Admission: 05/20/2015  Date of Discharge: 05/22/2015 Admitting Physician: No admitting provider for patient encounter.  Primary Care Provider: MCDIARMID,TODD D, MD Consultants: None   Indication for Hospitalization: Chest Pain   Discharge Diagnoses/Problem List:  Patient Active Problem List   Diagnosis Date Noted  . Chest pain with moderate risk of acute coronary syndrome 05/21/2015  . Diastolic dysfunction   . History of iron deficiency 01/16/2015  . Pure hypercholesterolemia 07/31/2014  . CAD S/P RCA BMS, residual CFX disease 06/08/2014  . Pacemaker 04/02/2013  . Mild cognitive impairment 10/26/2012  . Transitional cell carcinoma of ureter, history   . Chronic anticoagulation 10/01/2012  . Urge incontinence 12/13/2011  . AF (paroxysmal atrial fibrillation) ([place]) 11/11/2010  . Vitamin Stewart deficiency 11/02/2010  . Macular degeneration, bilateral 10/04/2010    Class: Chronic  . Solitary kidney, acquired 05/17/2010  . Spondylolisthesis of lumbar region   . VITAMIN B12 DEFICIENCY 10/07/2009  . Chronic kidney disease (CKD), stage IV (severe) ([place]) 09/02/2009  . MYOCARDIAL INFARCTION, HX OF 09/25/2008  . VENTRICULAR HYPERTROPHY, LEFT 08/28/2008  . At risk for diabetes mellitus 09/11/2007  . HYPERTENSION, BENIGN ESSENTIAL 06/18/2007  . ANXIETY 04/27/2006  . SICK SINUS SYNDROME 04/27/2006  . COPD, severe ([place]) 04/27/2006  . GASTROESOPHAGEAL REFLUX, NO ESOPHAGITIS 04/27/2006  . Osteoarthrosis involving lower leg 04/27/2006  . OSTEOARTHRITIS OF SPINE, NOS 04/27/2006  . LUMBAR SPINAL STENOSIS 04/27/2006    Disposition: Home   Discharge Condition: Stable   Discharge Exam:  General -- oriented x3, pleasant and cooperative. Elderly Chest -- Normal WOB. Faint wheezes bilaterally. No reproducible chest  pain.  Cardiac -- RRR. No murmurs noted.  Abdomen -- soft, nontender. No masses palpable. Bowel sounds present. Extremeties - no tenderness or effusions noted. ROM good. 5/5 bilateral strength. Dorsalis pedis pulses present and symmetrical.   Brief Hospital Course:  [name] is 82 y.o. female with PMH significant for COPD, AFib, HTN, COPD, CKD Stage III, sick sinus syndrome w/ pacemaker, and CAD s/p PCI who presented with substernal pressure reportedly similar to previous MI which was concerning for angina. Chest pain resolved quickly with nitroglycerin. Cardiology was consulted. Troponins were negative x4. EKGs were unchanged from previous.   Cardiology was not convinced pain was ischemic in nature. Discussed possibility of starting long-acting nitrates but blood pressures were not elevated. Cardiology arranged follow up with her cardiologist outpatient.   Issues for Follow Up:  1. Blood pressures were normotensive to slightly low but cardiology recommended continuing Beta Blocker if patient could tolerate. Dose was decreased to 12.5 mg daily. Please titrate as blood pressure allows.  2. Lasix was held at discharge due to AKI. Recheck BMET.  3. Patient with new left rhomboid tenderness. Discharged with Voltaren gel. Further management/work up per PCP.   Significant Procedures: None   Significant Labs and Imaging:   Recent Labs Lab 05/20/15 2228 05/21/15 0355  WBC 8.4 7.4  HGB 9.8* 9.3*  HCT 31.9* 29.6*  PLT 227 188    Recent Labs Lab 05/20/15 2228 05/21/15 0355  NA 140 142  K 3.8 4.0  CL 107 110  CO2 26 24  GLUCOSE 131* 134*  BUN 23* 23*  CREATININE 1.55* 1.47*  CALCIUM 8.3* 7.7*    Dg Chest 2 View  05/20/2015  CLINICAL DATA:  Midsternal chest pain and dyspnea today. One  episode of emesis. Former smoker. History of COPD and atrial fibrillation. EXAM: CHEST  2 VIEW COMPARISON:  03/04/2015 FINDINGS: To lead pacemaker remains in place. The cardiac silhouette remains  slightly enlarged. Thoracic aortic atherosclerosis is noted. The lungs remain hyperinflated with chronic interstitial coarsening. Opacity in the left mid to lower lung is unchanged and most likely reflects scarring. No acute osseous abnormality is identified. IMPRESSION: No active cardiopulmonary disease. Electronically Signed   By: Logan Bores M.Stewart.   On: 05/20/2015 22:15    Results/Tests Pending at Time of Discharge: None   Discharge Medications:    Medication List    STOP taking these medications        azithromycin 250 MG tablet  Commonly known as:  ZITHROMAX     furosemide 40 MG tablet  Commonly known as:  LASIX     metoprolol succinate 50 MG 24 hr tablet  Commonly known as:  TOPROL-XL      TAKE these medications        albuterol 1.25 MG/3ML nebulizer solution  Commonly known as:  ACCUNEB  Take 1 ampule by nebulization every 6 (six) hours as needed for wheezing or shortness of breath.     albuterol 108 (90 Base) MCG/ACT inhaler  Commonly known as:  VENTOLIN HFA  Inhale 2 puffs into the lungs every 6 (six) hours as needed. If needed for shortness of breath.     amiodarone 200 MG tablet  Commonly known as:  PACERONE  Take 1 tablet (200 mg total) by mouth daily.     apixaban 2.5 MG Tabs tablet  Commonly known as:  ELIQUIS  Take 1 tablet (2.5 mg total) by mouth 2 (two) times daily.     atorvastatin 20 MG tablet  Commonly known as:  LIPITOR  Take 1 tablet (20 mg total) by mouth daily.     cholecalciferol 1000 units tablet  Commonly known as:  VITAMIN Stewart  Take 1,000 Units by mouth daily.     diclofenac sodium 1 % Gel  Commonly known as:  VOLTAREN  Apply 2 g topically 4 (four) times daily.     Fluticasone-Salmeterol 250-50 MCG/DOSE Aepb  Commonly known as:  ADVAIR DISKUS  Inhale 2 puffs into the lungs 2 (two) times daily.     LORazepam 0.5 MG tablet  Commonly known as:  ATIVAN  TAKE 1 TABLET EVERY 8 HOURS AS NEEDED FOR ANXIETY OR SHORTNESS OF BREATH.      nitroGLYCERIN 0.4 MG SL tablet  Commonly known as:  NITROSTAT  PLACE 1 TABLET UNDER THE TONGUE EVERY 5 MINUTES AS NEEDED FOR CHEST PAIN     omeprazole 20 MG capsule  Commonly known as:  PRILOSEC  Take 1 capsule (20 mg total) by mouth daily.     tiotropium 18 MCG inhalation capsule  Commonly known as:  SPIRIVA  Place 1 capsule (18 mcg total) into inhaler and inhale daily.     traMADol 50 MG tablet  Commonly known as:  ULTRAM  Take 1 tablet (50 mg total) by mouth every 6 (six) hours as needed for moderate pain.     vitamin B-12 1000 MCG tablet  Commonly known as:  CYANOCOBALAMIN  Take 1,000 mcg by mouth daily.        Discharge Instructions: Please refer to Patient Instructions section of EMR for full details.  Patient was counseled important signs and symptoms that should prompt return to medical care, changes in medications, dietary instructions, activity restrictions, and follow up appointments.  Follow-Up Appointments: Follow-up Information    Follow up with Sinclair Grooms, MD.   Specialty:  Cardiology   Why:  office will contact you to see Dr [name] or an APP   Contact information:   [id number] [place] [address] [phone]       Follow up with Tammy D, MD. Go on 05/28/2015.   Specialty:  Family Medicine   Why:  For Hospital Followup at 9:30 am    Contact information:   [place] [address] [phone]       Nicolette Bang, DO 05/22/2015, 2:19 PM PGY-1, [place]

## 2015-05-21 NOTE — ED Notes (Signed)
Patient removed from oxygen per MD - SpO2 remains 98-100% RA.

## 2015-05-21 NOTE — ED Notes (Signed)
Lunch tray ordered for patient.

## 2015-05-28 ENCOUNTER — Ambulatory Visit (INDEPENDENT_AMBULATORY_CARE_PROVIDER_SITE_OTHER): Payer: Medicare Other | Admitting: Family Medicine

## 2015-05-28 ENCOUNTER — Encounter: Payer: Self-pay | Admitting: Family Medicine

## 2015-05-28 VITALS — BP 109/54 | HR 94 | Temp 97.8°F | Ht 65.0 in | Wt 160.0 lb

## 2015-05-28 DIAGNOSIS — G3184 Mild cognitive impairment, so stated: Secondary | ICD-10-CM | POA: Diagnosis not present

## 2015-05-28 DIAGNOSIS — R079 Chest pain, unspecified: Secondary | ICD-10-CM | POA: Diagnosis not present

## 2015-05-28 DIAGNOSIS — I480 Paroxysmal atrial fibrillation: Secondary | ICD-10-CM | POA: Diagnosis present

## 2015-05-28 DIAGNOSIS — I25119 Atherosclerotic heart disease of native coronary artery with unspecified angina pectoris: Secondary | ICD-10-CM

## 2015-05-28 NOTE — Progress Notes (Signed)
Cardiology Office Note    Date:  05/29/2015   ID:  [name], DOB Nov 28, 1923, MRN [id number]  PCP:  MCDIARMID,TODD D, MD  Cardiologist:  Dr Smith/ Dr Lovena Le   Post ER follow up- chest pain   History of Present Illness:  [name] is a 82 y.o. female CAD- s/p BMS to RCA w/ residual LCx disease (09/2008), severe COPD, CAD, PAF on Eliquis and amiodarone, HTN, SSS s/p Medtronic PPM who presents to clinic for post ER follow-up.   She.saw Dr. [name] in 05/2014 for evaluation of chest pain that was felt to be due to age fibrillation with RVR. She had a subsequent Myoview in 05/2014 that was low risk.  She was seen in the [facility] ER on 05/20/15 for chest pain non responsive to SL NTG lasting over 2 hours. She ruled out for MI. She stayed overnight in the emergency room secondary to have the hospital being full. In the morning she was feeling better requested to go home. The patient does not want to pursue aggressive measurements.  Today she presents to clinic for follow-up. She has not had any more chest pain and is feeling quite well. No SOB. She is about to go on a cruise with her two daughters. No LE edema, orthopnea or PND. No dizziness or syncope. No blood in her stool or urine.     Past Medical History  Diagnosis Date  . Candidiasis of the esophagus 11/26/2007  . Myocardial infarct, old   . COPD, severe   . Sick sinus syndrome with tachycardia ([place])   . Pacemaker   . Chronic kidney disease (CKD), stage III (moderate)   . Hypertension   . Acute appendicitis with rupture   . Spinal stenosis, lumbar   . Adrenal adenoma     Incidentaloma  . Lumbar herniated disc     History of HNP L4/5 in 2003  . Hx of colonoscopy with polypectomy 04/27/2010    Dr Cristina Gong found three  tubular adenomas each less than 10 mm size  . Retinal hemorrhage of left eye 06/2010  . Cataract 2013    Bilateral   . Abnormal mammogram, unspecified 08/23/2010    Followup imaging reassuring.  Repeat in  6 months.   Marland Kitchen DISC WITH RADICULOPATHY 04/27/2006    Qualifier: Diagnosis of  By: McDiarmid MD, Sherren Mocha    . Transitional cell carcinoma of ureter, history   . Mild cognitive impairment 10/26/2012    (10/25/12) Failed MiniCog screen  . 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 Normal uric acid serum level at time of attack.    Marland Kitchen EDEMA-LEGS,DUE TO VENOUS OBSTRUCT. 04/27/2006    Qualifier: Diagnosis of  By: McDiarmid MD, Sherren Mocha    . ANXIETY 04/27/2006    Qualifier: Diagnosis of  By: McDiarmid MD, Sherren Mocha    . HERNIA, HIATAL, NONCONGENITAL 04/27/2006    Qualifier: Diagnosis of  By: McDiarmid MD, Sherren Mocha    . History of Hemorrhoids 04/27/2006    Qualifier: Diagnosis of  By: McDiarmid MD, Sherren Mocha    . RHINITIS, ALLERGIC 04/27/2006    Qualifier: Diagnosis of  By: McDiarmid MD, Sherren Mocha    . SCHATZKI'S RING, HX OF 11/26/2007    Qualifier: Diagnosis of  By: McDiarmid MD, Sherren Mocha  An EGD was performed by Dr Cristina Gong on 04/27/2010 for iron deficiency anemia. There was a a transient hiatal hernia with Schatzki's  ring. Stomach and duodenum were normal. EGD on 03/06/12 by Dr Cristina Gong for IDA non-obstructing Schatzki's ring at Gastroesophageal junction, otherwise normal esophagus and stomach.    . Urge incontinence 12/13/2011    Diagnosed in 10/2011 by Dr Bjorn Loser (Urology)   . VITAMIN B12 DEFICIENCY 10/07/2009    Qualifier: Diagnosis of  By: McDiarmid MD, Sherren Mocha  Dx based on a post-TKR anemia work-up Low normal serum B12 with high Methylmalonic acid and homocysteine level  Vit B12 serum level (10/28/10) > 1500 pg/mL   . Vitamin D deficiency 11/02/2010    Serum vitamin D 25(OH) = 10.9 ng/mL (30 -100) on 10/28/10 c/w Vitamin D deficiency.     . AF (paroxysmal atrial fibrillation) ([place]) 11/11/2010    Hospitalization (9/8-9/10, Dr Daneen Schick, III, Cardiology) for Paroxysmal Atrial Fibrillation with RVR and anginal pain secondary to demand/supply mismatch in setting  of RVR with known circumflex artery branch disease.    . Iron deficiency anemia 08/05/2010    Dr Cristina Gong (GI) has evaluated with EGD, colonoscopy, and video capsular endoscopy in 2011 & 2012.  All have been unrevealing as to an origin of IDA.  OV with Dr Cristina Gong (10/28/10) assessment of blood in stool per hemoccult and GER. Hbg 12.1 g/dL, MCV 91.8, Ferritin 30 ng/mL. Patient taking on ferrous sulfate tab daily.   EGD on 03/06/12 by Dr Cristina Gong for IDA non-obstructing Schatzki's ring at Gastroesophageal junction, otherwise normal esophagus and stomach.     . Macular degeneration, bilateral 10/04/2010    Right eye is wet MD, the other is dry macular degeneration (ARMD). Pt undergoing some form of vascular endothelial growth factor inhibition intraocular therapy.    . VENTRICULAR HYPERTROPHY, LEFT 08/28/2008    Qualifier: Diagnosis of  By: McDiarmid MD, Sherren Mocha    . COPD 04/27/2006    Qualifier: Diagnosis of  By: McDiarmid MD, Sherren Mocha    . Solitary kidney, acquired 05/17/2010    Surgical removal for transitional cell cancer by Tresa Endo, MD ([facility]). Surveillance cystoscopy by Dr Alinda Money [facility]) on 10/19/12 without evidence of cystoscopic recurrence. Recommend RTC one year for cystoscopy.   . ADENOMATOUS COLONIC POLYP 03/01/2003    Qualifier: Diagnosis of  By: McDiarmid MD, Sherren Mocha Multiple benign polyps of cecum, ascending, transverse and sigmoid colon by 8/09 colonoscopy by Dr Cristina Gong  Colonoscopy by Dr Cristina Gong for iron-deficiency anemia on 04/27/2010 showed three sessile polyps that were in ascending (3 mm x 9 mm), transverse (4 mm), and cecum (3 mm).  All three were tubular adenomas that were negative for high grade dysplasia or malignancy on pathology. Dr Cristina Gong called the polpys benign and not requiring follow-up in view of the patients age.    . Soft tissue injury of foot 05/03/2011  . PREDIABETES 09/11/2007    Qualifier: Diagnosis of  By: McDiarmid MD, Sherren Mocha    . Numbness and tingling in hands 07/21/2011  .  MUSCLE CRAMPS 03/11/2010    Qualifier: Diagnosis of  By: McDiarmid MD, Sherren Mocha    . Leg cramps 05/29/2012  . Cellulitis of leg, right 10/01/2012  . Solar lentigo 06/15/2012  . Muscle spasm of back 09/24/2013  . Shoulder pain, left 01/09/2014  . Seborrheic keratosis, right anterior thigh 12/12/2013  . High risk medications (not anticoagulants) long-term use 03/05/2012  . CAP (community acquired pneumonia) 02/25/2015  . Decreased functional mobility and endurance 03/17/2015    Past Surgical History  Procedure Laterality Date  . Pacemaker insertion      Dr Lovena Le ([facility])  .  Nephrectomy  For transition cell cancer     Dr Tresa Endo, surgeon  . Replacement total knee  2009, Right knee    Dr Wynelle Link  . Replacement total knee  05/2009, Left knee    Dr Wynelle Link  . Knee arthroscopy w/ synovectomy  11/2009, left knee    Dr Wynelle Link  . Cholecystectomy    . Esophagogastroduodenoscopy  04/27/2010    Dr Cristina Gong - found transient H/H & Schatzki's ring  . Esophagogastroduodenoscopy endoscopy  03/06/2012    Dr Cristina Gong - found non-obstuctive Schatzki's ring at [employer] [facility]. o/w normal EGD.   Marland Kitchen Pacemaker generator change N/A 11/12/2012    Procedure: PACEMAKER GENERATOR CHANGE;  Surgeon: Evans Lance, MD;  Location: [facility] C[facility] LAB;  Service: Cardiovascular;  Laterality: N/A;  . Tonsillectomy    . Breast surgery      breast reduction  . Eye surgery      bilateral cataracts  . Trigger finger release Right 05/22/2014    Procedure: RIGHT HAND A-1 PULLEY RELEASE ;  Surgeon: Roseanne Kaufman, MD;  Location: [facility];  Service: Orthopedics;  Laterality: Right;  . Dupuytren contracture release Right 05/22/2014    Procedure: DUPUYTREN RELEASE AND REPAIR AS NECESSARY RIGHT RING FINGER AND MIDDLE FINGER;  Surgeon: Roseanne Kaufman, MD;  Location: [facility];  Service: Orthopedics;  Laterality: Right;    Current Medications: Outpatient Prescriptions Prior to Visit  Medication Sig Dispense Refill  . albuterol (ACCUNEB) 1.25 MG/3ML  nebulizer solution Take 1 ampule by nebulization every 6 (six) hours as needed for wheezing or shortness of breath.    Marland Kitchen albuterol (VENTOLIN HFA) 108 (90 BASE) MCG/ACT inhaler Inhale 2 puffs into the lungs every 6 (six) hours as needed. If needed for shortness of breath. 1 Inhaler 3  . amiodarone (PACERONE) 200 MG tablet Take 1 tablet (200 mg total) by mouth daily. 90 tablet 3  . apixaban (ELIQUIS) 2.5 MG TABS tablet Take 1 tablet (2.5 mg total) by mouth 2 (two) times daily. 60 tablet 5  . atorvastatin (LIPITOR) 20 MG tablet Take 1 tablet (20 mg total) by mouth daily. 30 tablet 11  . cholecalciferol (VITAMIN D) 1000 UNITS tablet Take 1,000 Units by mouth daily.    . diclofenac sodium (VOLTAREN) 1 % GEL Apply 2 g topically 4 (four) times daily. 100 g 0  . Fluticasone-Salmeterol (ADVAIR DISKUS) 250-50 MCG/DOSE AEPB Inhale 2 puffs into the lungs 2 (two) times daily. 60 each 11  . LORazepam (ATIVAN) 0.5 MG tablet TAKE 1 TABLET EVERY 8 HOURS AS NEEDED FOR ANXIETY OR SHORTNESS OF BREATH. 30 tablet 5  . omeprazole (PRILOSEC) 20 MG capsule Take 1 capsule (20 mg total) by mouth daily. 90 capsule 3  . senna (SENOKOT) 8.6 MG tablet Take 2 tablets by mouth daily.    Marland Kitchen tiotropium (SPIRIVA) 18 MCG inhalation capsule Place 1 capsule (18 mcg total) into inhaler and inhale daily. 30 capsule PRN  . traMADol (ULTRAM) 50 MG tablet Take 1 tablet (50 mg total) by mouth every 6 (six) hours as needed for moderate pain. 30 tablet 1  . vitamin B-12 (CYANOCOBALAMIN) 1000 MCG tablet Take 1,000 mcg by mouth daily.     . metoprolol succinate (TOPROL-XL) 25 MG 24 hr tablet TAKE 1/2 TABLET BY MOUTH DAILY. 45 tablet 3  . nitroGLYCERIN (NITROSTAT) 0.4 MG SL tablet PLACE 1 TABLET UNDER THE TONGUE EVERY 5 MINUTES AS NEEDED FOR CHEST PAIN 100 tablet 1   No facility-administered medications prior to visit.  Allergies:   Baclofen; Codeine phosphate; Norco; Hydrochlorothiazide; and Montelukast sodium   Social History   Social  History  . Marital Status: Widowed    Spouse Name: N/A  . Number of Children: 4  . Years of Education: N/A   Occupational History  . Futures trader     Retired   Social History Main Topics  . Smoking status: Former Smoker    Types: Cigarettes  . Smokeless tobacco: Never Used  . Alcohol Use: 12.0 oz/week    14 Glasses of wine, 6 Standard drinks or equivalent per week     Comment: 4-5 glasses of wine per week  . Drug Use: No  . Sexual Activity: No   Other Topics Concern  . 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    Semi-retired Futures trader.     Pt has home nebulizer for inhalation therapies.   Former Smoker   Smoking Status:  quit > 5 years ago      (+) DNR status per discussion with Dr McDiarmid 10/25/12 and reiterated 06/20/13 office visit .   (+) Living Will/Advance Directive   (+) HC-POA: Cecile Sheerer Causey (pt's dgt)     Family History:  The patient's family history includes Dementia in her mother; Heart disease in her father.   ROS:   Please see the history of present illness.    ROS All other systems reviewed and are negative.   PHYSICAL EXAM:   VS:  BP 122/78 mmHg  Pulse 71  Ht 5\' 5"  (1.651 m)  Wt 164 lb 9.6 oz (74.662 kg)  BMI 27.39 kg/m2   GEN: Well nourished, well developed, in no acute distress HEENT: normal Neck: no JVD, carotid bruits, or masses Cardiac: RRR; no murmurs, rubs, or gallops,no edema  Respiratory:  clear to auscultation bilaterally, normal work of breathing GI: soft, nontender, nondistended, + BS MS: no deformity or atrophy Skin: warm and dry, no rash Neuro:  Alert and Oriented x 3, Strength and sensation are intact Psych: euthymic mood, full affect  Wt Readings from Last 3 Encounters:  05/29/15 164 lb 9.6 oz (74.662 kg)  05/28/15 160 lb (72.576 kg)    05/14/15 160 lb (72.576 kg)      Studies/Labs Reviewed:   EKG:  EKG is ordered today.  The ekg ordered today demonstrates A paced. HR 71. Non specific ST/TW abnormalties  Recent Labs: 12/16/2014: TSH 3.133 02/25/2015: ALT 26 02/28/2015: Magnesium 1.9 03/06/2015: B Natriuretic Peptide 413.1* 05/21/2015: BUN 23*; Creatinine, Ser 1.47*; Hemoglobin 9.3*; Platelets 188; Potassium 4.0; Sodium 142   Lipid Panel    Component Value Date/Time   CHOL 179 05/20/2013 1604   TRIG 79 02/26/2015 1430   HDL 38* 05/20/2013 1604   CHOLHDL 4.7 05/20/2013 1604   VLDL 48* 05/20/2013 1604   LDLCALC 93 05/20/2013 1604   LDLDIRECT 74 05/08/2014 1110    Additional studies/ records that were reviewed today include:  06/24/14: Myoview Overall Impression: Low risk stress nuclear study with a small, moderate intensity, partially reversible anterior defect consistent with soft tissue attenuation and mild ischemia.  LV Ejection Fraction: 61%. LV Wall Motion: Normal Wall Motion  2D ECHO: 02/26/2015 LV EF: 55% - 60% Study Conclusions - Left ventricle: The cavity size was normal. There was mild  concentric  hypertrophy. Systolic function was normal. The  estimated ejection fraction was in the range of 55% to 60%. Wall  motion was normal; there were no regional wall motion  abnormalities. Features are consistent with a pseudonormal left  ventricular filling pattern, with concomitant abnormal relaxation  and increased filling pressure (grade 2 diastolic dysfunction).  Doppler parameters are consistent with elevated ventricular  end-diastolic filling pressure. - Mitral valve: There was mild regurgitation. - Left atrium: The atrium was moderately dilated. - Right ventricle: The cavity size was normal. Wall thickness was  moderately increased. Systolic function was normal. - Right atrium: The atrium was normal in size. - Tricuspid valve: There was mild regurgitation. - Pulmonary arteries:  Systolic pressure was within the normal  range. - Inferior vena cava: The vessel was normal in size. - Pericardium, extracardiac: A trivial pericardial effusion was  identified posterior to the heart. Features were not consistent  with tamponade physiology.     ASSESSMENT:    1. Chest pain, unspecified chest pain type   2. Coronary artery disease involving native coronary artery of native heart with angina pectoris ([place])   3. PAF (paroxysmal atrial fibrillation) ([place])   4. Essential hypertension   5. SSS (sick sinus syndrome) ([facility])   6. Chronic obstructive pulmonary disease, unspecified COPD type ([place])      PLAN:  In order of problems listed above:  1. Chest pain: now resolved.  No further work up  2. CAD: s/p BMS with residual LCx disease (2010). Low risk Myoview in 05/2014. Continue statin and BB. No ASA due to Eliquis use.   3. PAF: continue amiodarone 200mg  daily, Toprol XL 25mg  daily and Eliquis 2.5mg  daily.   4. HTN: BP 122/78. Well controlled on current regimen   5. SSS s/p Medtronic PPM: followed by Dr. Lovena Le.  6. COPD: stable.    Medication Adjustments/Labs and Tests Ordered: Current medicines are reviewed at length with the patient today.  Concerns regarding medicines are outlined above.  Medication changes, Labs and Tests ordered today are listed in the Patient Instructions below. Patient Instructions  Medication Instructions:  Your physician has recommended you make the following change in your medication:  1.  INCREASE the Metoprolol to 25 taking 1 whole tablet daily    Labwork: None ordered   Testing/Procedures: None ordered  Follow-Up: Your physician recommends that you schedule a follow-up appointment in: 2-3 [place]   Any Other Special Instructions Will Be Listed Below (If Applicable).     If you need a refill on your cardiac medications before your next appointment, please call your pharmacy.        Renea Ee  05/29/2015 10:41 AM    [facility] [place], [place], [place]  [address] Phone: [phone]; Fax: [phone]

## 2015-05-29 ENCOUNTER — Encounter: Payer: Self-pay | Admitting: Physician Assistant

## 2015-05-29 ENCOUNTER — Ambulatory Visit (INDEPENDENT_AMBULATORY_CARE_PROVIDER_SITE_OTHER): Payer: Medicare Other | Admitting: Physician Assistant

## 2015-05-29 ENCOUNTER — Encounter: Payer: Self-pay | Admitting: Family Medicine

## 2015-05-29 VITALS — BP 122/78 | HR 71 | Ht 65.0 in | Wt 164.6 lb

## 2015-05-29 DIAGNOSIS — I495 Sick sinus syndrome: Secondary | ICD-10-CM

## 2015-05-29 DIAGNOSIS — J449 Chronic obstructive pulmonary disease, unspecified: Secondary | ICD-10-CM

## 2015-05-29 DIAGNOSIS — R079 Chest pain, unspecified: Secondary | ICD-10-CM | POA: Diagnosis not present

## 2015-05-29 DIAGNOSIS — I480 Paroxysmal atrial fibrillation: Secondary | ICD-10-CM

## 2015-05-29 DIAGNOSIS — I10 Essential (primary) hypertension: Secondary | ICD-10-CM

## 2015-05-29 DIAGNOSIS — I25119 Atherosclerotic heart disease of native coronary artery with unspecified angina pectoris: Secondary | ICD-10-CM | POA: Diagnosis not present

## 2015-05-29 MED ORDER — METOPROLOL SUCCINATE ER 25 MG PO TB24: 25.0000 mg | ORAL_TABLET | Freq: Every day | ORAL | Status: DC

## 2015-05-29 NOTE — Assessment & Plan Note (Addendum)
Stable.  No recurrence of SSCP.  No need for use of NTG SL since hospital discahrge 05/01/15. [name] will follow up with Dr Linard Millers (Card). Patient not on Aspirin, but is on DOAC.  Uncertain if there is added benefit compared to risk with additional of aspirin to regiment.  Will defer to Dr Thompson Caul opinion.

## 2015-05-29 NOTE — Patient Instructions (Signed)
Medication Instructions:  Your physician has recommended you make the following change in your medication:  1.  INCREASE the Metoprolol to 25 taking 1 whole tablet daily    Labwork: None ordered   Testing/Procedures: None ordered  Follow-Up: Your physician recommends that you schedule a follow-up appointment in: 2-3 [place]   Any Other Special Instructions Will Be Listed Below (If Applicable).     If you need a refill on your cardiac medications before your next appointment, please call your pharmacy.

## 2015-05-29 NOTE — Progress Notes (Signed)
Subjective:    Patient ID: [name], female    DOB: 09/09/1923, 82 y.o.   MRN: [id number] [name] is accompanied by daughter, [name] Sources of clinical information for visit is/are patient, relative(s), past medical records and Discharge summary 3/2-05/03/15 hospitalization at [facility]. Nursing assessment for this office visit was reviewed with the patient for accuracy and revision.   HPI  Problem List Items Addressed This Visit      High   AF (paroxysmal atrial fibrillation) (HCC) - Primary (Chronic) - Diagnosed 2010 - Switch from Warfarin to Apixaban 2.5 mg BID on 05/15/15 when INR was 1.5. Variable eGFR. Last calculation in hospital at 30 ml/min.  Dosing discussed with [facility]. Lower dose Apixaban recommended because of patient's solitary kidney and her history of significantly variable eGFR primarily dependent on variable fluid status.  - No epistaxis, no BRBPR nor melena.  No gingival bleeding. No coca-cola colored urine.  - No palpitations. No near-syncope nor syncope.  - No new limb or facial weakness nor numbness, no change in vision, no difficulty with speech events.  - Completed [place] cruise recently with good endurance.      Medium   Mild cognitive impairment (Chronic) - Diagnosed last year with decreased MoCA score, primarily memory impairment and visuospatial ability.  - Pt able to perform her instrumental and daily activities of daily living independently - No reports concerning for behavioral or psychological symptoms of dementia. - No recent falls.      Unprioritized   Chest pain with moderate risk of acute coronary syndrome- FOLLOW UP - Hosptialization 3/2 - 05/01/15 on FMTS for SSCP onset at rest, atypical.  Ruled out for AMI by enzymes and EKG series.  Reported as having  responded to NTG SL in ED.  Hx of negative Myoview in last year reported.  - Seen by Dr T. Camuy (Card) during hospitalization who recommended follow up with Dr H. Smith (Card) as  outpatient.       No smoking  Review of Systems See HPI    Objective:   Physical Exam  VS reviewed GEN: Alert, Cooperative, Groomed, NAD HEENT: PERRL;  COR: RRR, No M/G/R, No JVD, Normal PMI size and location LUNGS: BCTA, No Acc mm use, speaking in full sentences EXT: No peripheral leg edema. Palpable bilateral pedal pulses.  Gait: Normal speed, No significant path deviation, Step through +,  Psych: Normal affect/thought/speech/language       Assessment & Plan:   

## 2015-05-29 NOTE — Assessment & Plan Note (Signed)
Established problem. Stable. No loss in independence in  ADLs nor iADLs Continue to monitor for functional impairment that may need social and community interventions.

## 2015-05-29 NOTE — Assessment & Plan Note (Signed)
Established problem In NSR today and on Apixaban for 2 weeks without reported signs of bleeding. Off Warfarin for 2 weeks.  Switch from Warfarin to Apixaban 2.5 mg BID on 05/15/15 when INR was 1.5. . Last calculation in hospital at 30 ml/min.  Dosing discussed with [facility]. Lower dose Apixaban recommended because of patient's solitary kidney and her history of significantly variable eGFR primarily dependent on variable fluid status.   Continue Apixaban 2.5 mg BID. Remain off Warfarin.  RTC 3 months.

## 2015-06-01 ENCOUNTER — Ambulatory Visit (INDEPENDENT_AMBULATORY_CARE_PROVIDER_SITE_OTHER): Payer: Medicare Other | Admitting: Emergency Medicine

## 2015-06-01 ENCOUNTER — Encounter: Payer: Self-pay | Admitting: Emergency Medicine

## 2015-06-01 VITALS — BP 110/78 | HR 82

## 2015-06-01 DIAGNOSIS — I25119 Atherosclerotic heart disease of native coronary artery with unspecified angina pectoris: Secondary | ICD-10-CM | POA: Diagnosis not present

## 2015-06-01 DIAGNOSIS — J449 Chronic obstructive pulmonary disease, unspecified: Secondary | ICD-10-CM | POA: Diagnosis not present

## 2015-06-01 NOTE — Patient Instructions (Signed)
Please continue your Spiriva and Advair on a schedule as you have been using them Keep albuterol available to use (either nebulizer or 2 puffs inhaled) up to every 4 hours IF NEEDED for shortness of breath.  We will perform walking oximetry today on room air today.  Follow with Dr Lamonte Sakai in 4 months or sooner if you have any problems

## 2015-06-01 NOTE — Assessment & Plan Note (Signed)
Severe COPD based on spirometry from 2014 and based on medical symptoms. COPD class B or C. She has required supplement oxygen in the past possibly as related to her age or fibrillation as to her obstructive lung disease. I would like to confirm that she does not desaturate today. Otherwise we can continue Spiriva and Advair as her maintenance bronchodilators. We will continue albuterol as needed but I cautioned her not to use without symptoms as I want to avoid the important side effect of tachycardia and perturbation of her atrial fib

## 2015-06-01 NOTE — Progress Notes (Signed)
GERD  Subjective:    Patient ID: [name], female    DOB: 08/28/1923, 82 y.o.   MRN: [id number]  HPI 82 yo former smoker with a history of Horner disease, sick sinus syndrome, hypertension, chronic kidney disease, GERD with a hiatal hernia. She also has significant COPD for which she has been followed by Dr. Casper Harrison at [facility]. She was readmitted with A fib and RVR, associated with CP and dyspnea.   She has been managed on Spiriva and Advair. Has been treated with O2 at times in the past but is not on it currently She has not needed albuterol with any regularity.  She flares about once every 2 years. She has minimal coughing. She rarely wheezes. She uses lasix prn for LE edema.   She had Spirometry 2014 at [facility] >> FEV1 1.02L (60% pred).     Review of Systems  Constitutional: Negative for fever and unexpected weight change.  HENT: Positive for congestion and sneezing. Negative for dental problem, ear pain, nosebleeds, postnasal drip, rhinorrhea, sinus pressure, sore throat and trouble swallowing.   Eyes: Negative for redness and itching.  Respiratory: Positive for shortness of breath. Negative for cough, chest tightness and wheezing.   Cardiovascular: Positive for chest pain and palpitations. Negative for leg swelling.  Gastrointestinal: Negative for nausea and vomiting.  Genitourinary: Negative for dysuria.  Musculoskeletal: Negative for joint swelling.  Skin: Negative for rash.  Neurological: Positive for headaches.  Hematological: Does not bruise/bleed easily.  Psychiatric/Behavioral: Negative for dysphoric mood. The patient is nervous/anxious.     Past Medical History  Diagnosis Date  . Candidiasis of the esophagus 11/26/2007  . Myocardial infarct, old   . COPD, severe   . Sick sinus syndrome with tachycardia ([place])   . Pacemaker   . Chronic kidney disease (CKD), stage III (moderate)   . Hypertension   . Acute appendicitis with rupture   . Spinal stenosis, lumbar   . Adrenal  adenoma     Incidentaloma  . Lumbar herniated disc     History of HNP L4/5 in 2003  . Hx of colonoscopy with polypectomy 04/27/2010    Dr Cristina Gong found three  tubular adenomas each less than 10 mm size  . Retinal hemorrhage of left eye 06/2010  . Cataract 2013    Bilateral   . Abnormal mammogram, unspecified 08/23/2010    Followup imaging reassuring.  Repeat in 6 months.   Marland Kitchen DISC WITH RADICULOPATHY 04/27/2006    Qualifier: Diagnosis of  By: McDiarmid MD, Sherren Mocha    . Transitional cell carcinoma of ureter, history   . Mild cognitive impairment 10/26/2012    (10/25/12) Failed MiniCog screen  . 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 Normal uric acid serum level at time of attack.    Marland Kitchen EDEMA-LEGS,DUE TO VENOUS OBSTRUCT. 04/27/2006    Qualifier: Diagnosis of  By: McDiarmid MD, Sherren Mocha    . ANXIETY 04/27/2006    Qualifier: Diagnosis of  By: McDiarmid MD, Sherren Mocha    . HERNIA, HIATAL, NONCONGENITAL 04/27/2006    Qualifier: Diagnosis of  By: McDiarmid MD, Sherren Mocha    . History of Hemorrhoids 04/27/2006    Qualifier: Diagnosis of  By: McDiarmid MD, Sherren Mocha    . RHINITIS, ALLERGIC 04/27/2006    Qualifier: Diagnosis of  By: McDiarmid MD, Sherren Mocha    . SCHATZKI'S RING, HX OF 11/26/2007    Qualifier: Diagnosis of  By: McDiarmid MD, Sherren Mocha  An EGD was performed by Dr Cristina Gong on 04/27/2010 for iron deficiency anemia. There was a a transient hiatal hernia with Schatzki's ring. Stomach and duodenum were normal. EGD on 03/06/12 by Dr Cristina Gong for IDA non-obstructing Schatzki's ring at Gastroesophageal junction, otherwise normal esophagus and stomach.    . Urge incontinence 12/13/2011    Diagnosed in 10/2011 by Dr Bjorn Loser (Urology)   . VITAMIN B12 DEFICIENCY 10/07/2009    Qualifier: Diagnosis of  By: McDiarmid MD, Sherren Mocha  Dx based on a post-TKR anemia work-up Low normal serum B12 with high Methylmalonic acid and homocysteine level  Vit B12 serum level  (10/28/10) > 1500 pg/mL   . Vitamin D deficiency 11/02/2010    Serum vitamin D 25(OH) = 10.9 ng/mL (30 -100) on 10/28/10 c/w Vitamin D deficiency.     . AF (paroxysmal atrial fibrillation) ([place]) 11/11/2010    Hospitalization (9/8-9/10, Dr Daneen Schick, III, Cardiology) for Paroxysmal Atrial Fibrillation with RVR and anginal pain secondary to demand/supply mismatch in setting of RVR with known circumflex artery branch disease.    . Iron deficiency anemia 08/05/2010    Dr Cristina Gong (GI) has evaluated with EGD, colonoscopy, and video capsular endoscopy in 2011 & 2012.  All have been unrevealing as to an origin of IDA.  OV with Dr Cristina Gong (10/28/10) assessment of blood in stool per hemoccult and GER. Hbg 12.1 g/dL, MCV 91.8, Ferritin 30 ng/mL. Patient taking on ferrous sulfate tab daily.   EGD on 03/06/12 by Dr Cristina Gong for IDA non-obstructing Schatzki's ring at Gastroesophageal junction, otherwise normal esophagus and stomach.     . Macular degeneration, bilateral 10/04/2010    Right eye is wet MD, the other is dry macular degeneration (ARMD). Pt undergoing some form of vascular endothelial growth factor inhibition intraocular therapy.    . VENTRICULAR HYPERTROPHY, LEFT 08/28/2008    Qualifier: Diagnosis of  By: McDiarmid MD, Sherren Mocha    . COPD 04/27/2006    Qualifier: Diagnosis of  By: McDiarmid MD, Sherren Mocha    . Solitary kidney, acquired 05/17/2010    Surgical removal for transitional cell cancer by Tresa Endo, MD ([facility]). Surveillance cystoscopy by Dr Alinda Money [facility]) on 10/19/12 without evidence of cystoscopic recurrence. Recommend RTC one year for cystoscopy.   . ADENOMATOUS COLONIC POLYP 03/01/2003    Qualifier: Diagnosis of  By: McDiarmid MD, Sherren Mocha Multiple benign polyps of cecum, ascending, transverse and sigmoid colon by 8/09 colonoscopy by Dr Cristina Gong  Colonoscopy by Dr Cristina Gong for iron-deficiency anemia on 04/27/2010 showed three sessile polyps that were in ascending (3 mm x 9 mm), transverse (4 mm), and cecum (3  mm).  All three were tubular adenomas that were negative for high grade dysplasia or malignancy on pathology. Dr Cristina Gong called the polpys benign and not requiring follow-up in view of the patients age.    . Soft tissue injury of foot 05/03/2011  . PREDIABETES 09/11/2007    Qualifier: Diagnosis of  By: McDiarmid MD, Sherren Mocha    . Numbness and tingling in hands 07/21/2011  . MUSCLE CRAMPS 03/11/2010    Qualifier: Diagnosis of  By: McDiarmid MD, Sherren Mocha    . Leg cramps 05/29/2012  . Cellulitis of leg, right 10/01/2012  . Solar lentigo 06/15/2012  . Muscle spasm of back 09/24/2013  . Shoulder pain, left 01/09/2014  . Seborrheic keratosis, right anterior thigh 12/12/2013  . High risk medications (not anticoagulants) long-term use 03/05/2012  . CAP (community acquired pneumonia) 02/25/2015  . Decreased functional mobility  and endurance 03/17/2015     Family History  Problem Relation Age of Onset  . Dementia Mother   . Heart disease Father      Social History   Social History  . Marital Status: Widowed    Spouse Name: N/A  . Number of Children: 4  . Years of Education: N/A   Occupational History  . Futures trader     Retired   Social History Main Topics  . Smoking status: Former Smoker -- 1.00 packs/day    Types: Cigarettes    Quit date: 03/02/2009  . Smokeless tobacco: Never Used  . Alcohol Use: 12.0 oz/week    14 Glasses of wine, 6 Standard drinks or equivalent per week     Comment: 4-5 glasses of wine per week  . Drug Use: No  . Sexual Activity: No   Other Topics Concern  . 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    Semi-retired Futures trader.     Pt has home nebulizer for inhalation therapies.   Former Smoker   Smoking Status:  quit > 5 years ago      (+) DNR status per discussion with Dr  McDiarmid 10/25/12 and reiterated 06/20/13 office visit .   (+) Living Will/Advance Directive   (+) HC-POA: Cecile Sheerer Causey (pt's dgt)     Allergies  Allergen Reactions  . Baclofen Other (See Comments)    Confusion occurred with taking 3 at the same time.  . Codeine Phosphate Nausea Only  . Norco [Hydrocodone-Acetaminophen] Nausea And Vomiting  . Hydrochlorothiazide Other (See Comments)    Possible acute gouty arthritis of wrist  . Montelukast Sodium Other (See Comments)    Unspecified reaction.      Outpatient Prescriptions Prior to Visit  Medication Sig Dispense Refill  . albuterol (ACCUNEB) 1.25 MG/3ML nebulizer solution Take 1 ampule by nebulization every 6 (six) hours as needed for wheezing or shortness of breath.    Marland Kitchen albuterol (VENTOLIN HFA) 108 (90 BASE) MCG/ACT inhaler Inhale 2 puffs into the lungs every 6 (six) hours as needed. If needed for shortness of breath. 1 Inhaler 3  . amiodarone (PACERONE) 200 MG tablet Take 1 tablet (200 mg total) by mouth daily. 90 tablet 3  . apixaban (ELIQUIS) 2.5 MG TABS tablet Take 1 tablet (2.5 mg total) by mouth 2 (two) times daily. 60 tablet 5  . atorvastatin (LIPITOR) 20 MG tablet Take 1 tablet (20 mg total) by mouth daily. 30 tablet 11  . cholecalciferol (VITAMIN D) 1000 UNITS tablet Take 1,000 Units by mouth daily.    . diclofenac sodium (VOLTAREN) 1 % GEL Apply 2 g topically 4 (four) times daily. 100 g 0  . Fluticasone-Salmeterol (ADVAIR DISKUS) 250-50 MCG/DOSE AEPB Inhale 2 puffs into the lungs 2 (two) times daily. 60 each 11  . LORazepam (ATIVAN) 0.5 MG tablet TAKE 1 TABLET EVERY 8 HOURS AS NEEDED FOR ANXIETY OR SHORTNESS OF BREATH. 30 tablet 5  . metoprolol succinate (TOPROL-XL) 25 MG 24 hr tablet Take 1 tablet (25 mg total) by mouth daily. 90 tablet 3  . nitroGLYCERIN (NITROSTAT) 0.4 MG SL tablet Place 0.4 mg under the tongue every 5 (five) minutes as needed for chest pain (x 3 tabs).    Marland Kitchen omeprazole (PRILOSEC) 20  MG capsule Take 1  capsule (20 mg total) by mouth daily. 90 capsule 3  . senna (SENOKOT) 8.6 MG tablet Take 2 tablets by mouth daily.    Marland Kitchen tiotropium (SPIRIVA) 18 MCG inhalation capsule Place 1 capsule (18 mcg total) into inhaler and inhale daily. 30 capsule PRN  . traMADol (ULTRAM) 50 MG tablet Take 1 tablet (50 mg total) by mouth every 6 (six) hours as needed for moderate pain. 30 tablet 1  . vitamin B-12 (CYANOCOBALAMIN) 1000 MCG tablet Take 1,000 mcg by mouth daily.      No facility-administered medications prior to visit.         Objective:   Physical Exam  Filed Vitals:   06/01/15 1611  BP: 110/78  Pulse: 82  SpO2: 97%   Gen: Pleasant, elderly woman in no distress,  normal affect  ENT: No lesions,  mouth clear,  oropharynx clear, no postnasal drip  Neck: No JVD, no stridor  Lungs: No use of accessory muscles, somewhat distant, very mild soft wheezes left greater than right, mostly clear  Cardiovascular: Irregular, heart sounds normal, no murmur or gallops, no peripheral edema  Musculoskeletal: No deformities, no cyanosis or clubbing  Neuro: alert, non focal  Skin: Warm, no lesions or rashes      Assessment & Plan:  COPD, severe (HCC) Severe COPD based on spirometry from 2014 and based on medical symptoms. COPD class B or C. She has required supplement oxygen in the past possibly as related to her age or fibrillation as to her obstructive lung disease. I would like to confirm that she does not desaturate today. Otherwise we can continue Spiriva and Advair as her maintenance bronchodilators. We will continue albuterol as needed but I cautioned her not to use without symptoms as I want to avoid the important side effect of tachycardia and perturbation of her atrial fib

## 2015-06-12 ENCOUNTER — Telehealth: Payer: Self-pay | Admitting: Interventional Cardiology

## 2015-06-12 NOTE — Telephone Encounter (Signed)
New message  Pt daughter called states that while on the cruise states that the pt took a very bad fall on the cruise and she has a very bad mark on her face. The doctors on the cruise states that when she gets home she will need an urgent appt. When she fell she tore skin from her leg so she is currently taking an antibiotic. Request a call back to discuss.

## 2015-06-12 NOTE — Telephone Encounter (Signed)
Patient has been scheduled for an appointment. Called patient's daughter (DPR) about her mother. Patient is currently on a cruise ship, and took a fall. Patient hit her head and has bruising all over her face. Since patient is on [place], informed daughter to take patient to the ED as soon as the ship is docked. Daughter will let her other sister and her mother know, and will keep appointment on 06/17/15.

## 2015-06-13 ENCOUNTER — Emergency Department (HOSPITAL_COMMUNITY): Payer: Medicare Other

## 2015-06-13 ENCOUNTER — Encounter (HOSPITAL_COMMUNITY): Payer: Self-pay | Admitting: Nurse Practitioner

## 2015-06-13 ENCOUNTER — Observation Stay (HOSPITAL_COMMUNITY)
Admission: EM | Admit: 2015-06-13 | Discharge: 2015-06-15 | Disposition: A | Discharge status: Home Health | Payer: Medicare Other | Attending: Family Medicine | Admitting: Family Medicine

## 2015-06-13 DIAGNOSIS — S0990XA Unspecified injury of head, initial encounter: Secondary | ICD-10-CM | POA: Diagnosis not present

## 2015-06-13 DIAGNOSIS — I480 Paroxysmal atrial fibrillation: Secondary | ICD-10-CM | POA: Insufficient documentation

## 2015-06-13 DIAGNOSIS — Z955 Presence of coronary angioplasty implant and graft: Secondary | ICD-10-CM | POA: Insufficient documentation

## 2015-06-13 DIAGNOSIS — I9589 Other hypotension: Secondary | ICD-10-CM | POA: Diagnosis not present

## 2015-06-13 DIAGNOSIS — M1990 Unspecified osteoarthritis, unspecified site: Secondary | ICD-10-CM | POA: Insufficient documentation

## 2015-06-13 DIAGNOSIS — Z8551 Personal history of malignant neoplasm of bladder: Secondary | ICD-10-CM | POA: Diagnosis not present

## 2015-06-13 DIAGNOSIS — I252 Old myocardial infarction: Secondary | ICD-10-CM | POA: Diagnosis not present

## 2015-06-13 DIAGNOSIS — R410 Disorientation, unspecified: Secondary | ICD-10-CM

## 2015-06-13 DIAGNOSIS — N183 Chronic kidney disease, stage 3 (moderate): Secondary | ICD-10-CM | POA: Diagnosis not present

## 2015-06-13 DIAGNOSIS — R0689 Other abnormalities of breathing: Secondary | ICD-10-CM

## 2015-06-13 DIAGNOSIS — I495 Sick sinus syndrome: Secondary | ICD-10-CM | POA: Diagnosis not present

## 2015-06-13 DIAGNOSIS — Z87891 Personal history of nicotine dependence: Secondary | ICD-10-CM | POA: Diagnosis not present

## 2015-06-13 DIAGNOSIS — R531 Weakness: Secondary | ICD-10-CM | POA: Insufficient documentation

## 2015-06-13 DIAGNOSIS — L03115 Cellulitis of right lower limb: Secondary | ICD-10-CM | POA: Diagnosis not present

## 2015-06-13 DIAGNOSIS — R509 Fever, unspecified: Secondary | ICD-10-CM | POA: Diagnosis not present

## 2015-06-13 DIAGNOSIS — Z905 Acquired absence of kidney: Secondary | ICD-10-CM | POA: Insufficient documentation

## 2015-06-13 DIAGNOSIS — I2510 Atherosclerotic heart disease of native coronary artery without angina pectoris: Secondary | ICD-10-CM | POA: Diagnosis not present

## 2015-06-13 DIAGNOSIS — J441 Chronic obstructive pulmonary disease with (acute) exacerbation: Principal | ICD-10-CM | POA: Insufficient documentation

## 2015-06-13 DIAGNOSIS — Z79899 Other long term (current) drug therapy: Secondary | ICD-10-CM | POA: Diagnosis not present

## 2015-06-13 DIAGNOSIS — S0083XA Contusion of other part of head, initial encounter: Secondary | ICD-10-CM | POA: Diagnosis not present

## 2015-06-13 DIAGNOSIS — I25119 Atherosclerotic heart disease of native coronary artery with unspecified angina pectoris: Secondary | ICD-10-CM | POA: Diagnosis not present

## 2015-06-13 DIAGNOSIS — R4182 Altered mental status, unspecified: Secondary | ICD-10-CM | POA: Diagnosis not present

## 2015-06-13 DIAGNOSIS — G9340 Encephalopathy, unspecified: Secondary | ICD-10-CM | POA: Diagnosis not present

## 2015-06-13 DIAGNOSIS — Z7901 Long term (current) use of anticoagulants: Secondary | ICD-10-CM | POA: Diagnosis not present

## 2015-06-13 DIAGNOSIS — F419 Anxiety disorder, unspecified: Secondary | ICD-10-CM | POA: Insufficient documentation

## 2015-06-13 DIAGNOSIS — S199XXA Unspecified injury of neck, initial encounter: Secondary | ICD-10-CM | POA: Diagnosis not present

## 2015-06-13 DIAGNOSIS — S060X9A Concussion with loss of consciousness of unspecified duration, initial encounter: Secondary | ICD-10-CM | POA: Insufficient documentation

## 2015-06-13 DIAGNOSIS — S060X9S Concussion with loss of consciousness of unspecified duration, sequela: Secondary | ICD-10-CM

## 2015-06-13 DIAGNOSIS — I129 Hypertensive chronic kidney disease with stage 1 through stage 4 chronic kidney disease, or unspecified chronic kidney disease: Secondary | ICD-10-CM | POA: Insufficient documentation

## 2015-06-13 DIAGNOSIS — W010XXA Fall on same level from slipping, tripping and stumbling without subsequent striking against object, initial encounter: Secondary | ICD-10-CM | POA: Insufficient documentation

## 2015-06-13 DIAGNOSIS — S0993XA Unspecified injury of face, initial encounter: Secondary | ICD-10-CM | POA: Diagnosis not present

## 2015-06-13 DIAGNOSIS — K5900 Constipation, unspecified: Secondary | ICD-10-CM | POA: Diagnosis not present

## 2015-06-13 DIAGNOSIS — R0600 Dyspnea, unspecified: Secondary | ICD-10-CM | POA: Diagnosis present

## 2015-06-13 DIAGNOSIS — J189 Pneumonia, unspecified organism: Secondary | ICD-10-CM | POA: Diagnosis not present

## 2015-06-13 LAB — COMPREHENSIVE METABOLIC PANEL
ALT: 15 U/L (ref 14–54)
ANION GAP: 13 (ref 5–15)
AST: 21 U/L (ref 15–41)
Albumin: 3.4 g/dL — ABNORMAL LOW (ref 3.5–5.0)
Alkaline Phosphatase: 54 U/L (ref 38–126)
BUN: 24 mg/dL — ABNORMAL HIGH (ref 6–20)
CHLORIDE: 97 mmol/L — AB (ref 101–111)
CO2: 24 mmol/L (ref 22–32)
Calcium: 8.7 mg/dL — ABNORMAL LOW (ref 8.9–10.3)
Creatinine, Ser: 1.49 mg/dL — ABNORMAL HIGH (ref 0.44–1.00)
GFR, EST AFRICAN AMERICAN: 35 mL/min — AB (ref >60)
GFR, EST NON AFRICAN AMERICAN: 30 mL/min — AB (ref >60)
Glucose, Bld: 128 mg/dL — ABNORMAL HIGH (ref 65–99)
POTASSIUM: 4.2 mmol/L (ref 3.5–5.1)
Sodium: 134 mmol/L — ABNORMAL LOW (ref 135–145)
Total Bilirubin: 1.2 mg/dL (ref 0.3–1.2)
Total Protein: 6.6 g/dL (ref 6.5–8.1)

## 2015-06-13 LAB — I-STAT CG4 LACTIC ACID, ED: LACTIC ACID, VENOUS: 1.12 mmol/L (ref 0.5–2.0)

## 2015-06-13 LAB — CBC
HEMATOCRIT: 33 % — AB (ref 36.0–46.0)
Hemoglobin: 10.4 g/dL — ABNORMAL LOW (ref 12.0–15.0)
MCH: 30.1 pg (ref 26.0–34.0)
MCHC: 31.5 g/dL (ref 30.0–36.0)
MCV: 95.4 fL (ref 78.0–100.0)
Platelets: 194 10*3/uL (ref 150–400)
RBC: 3.46 MIL/uL — AB (ref 3.87–5.11)
RDW: 13.1 % (ref 11.5–15.5)
WBC: 8.8 10*3/uL (ref 4.0–10.5)

## 2015-06-13 LAB — URINALYSIS, ROUTINE W REFLEX MICROSCOPIC
Bilirubin Urine: NEGATIVE
GLUCOSE, UA: NEGATIVE mg/dL
HGB URINE DIPSTICK: NEGATIVE
Ketones, ur: NEGATIVE mg/dL
LEUKOCYTES UA: NEGATIVE
Nitrite: NEGATIVE
Protein, ur: NEGATIVE mg/dL
SPECIFIC GRAVITY, URINE: 1.012 (ref 1.005–1.030)
pH: 5.5 (ref 5.0–8.0)

## 2015-06-13 MED ORDER — PIPERACILLIN-TAZOBACTAM 3.375 G IVPB 30 MIN
3.3750 g | Freq: Once | INTRAVENOUS | Status: AC
  Administered 2015-06-13: 3.375 g via INTRAVENOUS
  Filled 2015-06-13: qty 50

## 2015-06-13 MED ORDER — ACETAMINOPHEN 325 MG PO TABS
650.0000 mg | ORAL_TABLET | Freq: Once | ORAL | Status: AC
  Administered 2015-06-13: 650 mg via ORAL
  Filled 2015-06-13: qty 2

## 2015-06-13 MED ORDER — ONDANSETRON HCL 4 MG/2ML IJ SOLN
4.0000 mg | Freq: Once | INTRAMUSCULAR | Status: AC
  Administered 2015-06-13: 4 mg via INTRAVENOUS
  Filled 2015-06-13: qty 2

## 2015-06-13 MED ORDER — VANCOMYCIN HCL IN DEXTROSE 1-5 GM/200ML-% IV SOLN
1000.0000 mg | Freq: Once | INTRAVENOUS | Status: AC
  Administered 2015-06-13: 1000 mg via INTRAVENOUS
  Filled 2015-06-13: qty 200

## 2015-06-13 MED ORDER — SODIUM CHLORIDE 0.9 % IV BOLUS (SEPSIS)
500.0000 mL | Freq: Once | INTRAVENOUS | Status: AC
Start: 2015-06-13 — End: 2015-06-13
  Administered 2015-06-13: 500 mL via INTRAVENOUS

## 2015-06-13 MED ORDER — VANCOMYCIN HCL IN DEXTROSE 750-5 MG/150ML-% IV SOLN
750.0000 mg | INTRAVENOUS | Status: DC
Start: 2015-06-14 — End: 2015-06-14

## 2015-06-13 NOTE — ED Notes (Signed)
Patient's daughter, [name], would like to be notified of patient's disposition when decided. She may be reached at cell: [phone] or home: [phone].

## 2015-06-13 NOTE — ED Notes (Addendum)
Pt fell on Tuesday. She states she tripped and fell onto the front of her body. She denies any LOC. She was on a cruise ship and was evaluated by the doctor there. She has an abrasion to her RLE that was dressed and treated with oral keflex for infection prevention. She developed nausea after starting the abx which she has been taking zofran for. She has bruising to her face and neck. She is on eliquis. Today family felt the pt has started to say things that "dont make sense" so they decided to bring her to ER. She is alert and oriented now, she c/o "Feeling cold" and pain in her R knee.

## 2015-06-13 NOTE — ED Provider Notes (Signed)
CSN: [id number]     Arrival date & time 06/13/15  1838 History   First MD Initiated Contact with Patient 06/13/15 1918     Chief Complaint  Patient presents with  . Fall     (Consider location/radiation/quality/duration/timing/severity/associated sxs/prior Treatment) HPI Comments: Tripped on a bag on the floor on Tuesday, fell hit head and knee Was seen by cruise physician No LOC Eliquis Wednesday nausea, which has since improved Now patient just says she feels "yucky" Cough productive of yellow sputum began today Feeling cold, no known fevers at home, however temp 100 in ED Pain in knee is about the same as prior Taking keflex as rx by cruise physician  This morning pt appeared confused and was saying "things that just didn't make sense" per family which is new for her. Reports she is better now however they worry that she will get worse again. Deny other focal neurologic def. Family requests observation if possible given concerns.  82 year old female with a history of COPD, coronary artery disease on Eliquis, sick sinus syndrome with tachycardia, CKD, hypertension presents with concern for confusion in setting of having a fall while on a cruise on Tuesday, developing nausea vomiting the day after that, cough today, and cellulitis around leg wound from fall on Tuesday for which she has been on keflex.        Patient is a 82 y.o. female presenting with fall.  Fall Associated symptoms include headaches (mild) and shortness of breath (used inhaler). Pertinent negatives include no chest pain and no abdominal pain.    Past Medical History  Diagnosis Date  . Candidiasis of the esophagus 11/26/2007  . Myocardial infarct, old   . COPD, severe   . Sick sinus syndrome with tachycardia ([place])   . Pacemaker   . Chronic kidney disease (CKD), stage III (moderate)   . Hypertension   . Acute appendicitis with rupture   . Spinal stenosis, lumbar   . Adrenal adenoma     Incidentaloma  .  Lumbar herniated disc     History of HNP L4/5 in 2003  . Hx of colonoscopy with polypectomy 04/27/2010    Dr Cristina Gong found three  tubular adenomas each less than 10 mm size  . Retinal hemorrhage of left eye 06/2010  . Cataract 2013    Bilateral   . Abnormal mammogram, unspecified 08/23/2010    Followup imaging reassuring.  Repeat in 6 months.   Marland Kitchen DISC WITH RADICULOPATHY 04/27/2006    Qualifier: Diagnosis of  By: McDiarmid MD, Sherren Mocha    . Transitional cell carcinoma of ureter, history   . Mild cognitive impairment 10/26/2012    (10/25/12) Failed MiniCog screen  . 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 Normal uric acid serum level at time of attack.    Marland Kitchen EDEMA-LEGS,DUE TO VENOUS OBSTRUCT. 04/27/2006    Qualifier: Diagnosis of  By: McDiarmid MD, Sherren Mocha    . ANXIETY 04/27/2006    Qualifier: Diagnosis of  By: McDiarmid MD, Sherren Mocha    . HERNIA, HIATAL, NONCONGENITAL 04/27/2006    Qualifier: Diagnosis of  By: McDiarmid MD, Sherren Mocha    . History of Hemorrhoids 04/27/2006    Qualifier: Diagnosis of  By: McDiarmid MD, Sherren Mocha    . RHINITIS, ALLERGIC 04/27/2006    Qualifier: Diagnosis of  By: McDiarmid MD, Sherren Mocha    . SCHATZKI'S RING, HX OF 11/26/2007    Qualifier: Diagnosis of  By: McDiarmid MD, Sherren Mocha  An EGD was performed by Dr Cristina Gong on 04/27/2010 for iron deficiency anemia. There was a a transient hiatal hernia with Schatzki's ring. Stomach and duodenum were normal. EGD on 03/06/12 by Dr Cristina Gong for IDA non-obstructing Schatzki's ring at Gastroesophageal junction, otherwise normal esophagus and stomach.    . Urge incontinence 12/13/2011    Diagnosed in 10/2011 by Dr Bjorn Loser (Urology)   . VITAMIN B12 DEFICIENCY 10/07/2009    Qualifier: Diagnosis of  By: McDiarmid MD, Sherren Mocha  Dx based on a post-TKR anemia work-up Low normal serum B12 with high Methylmalonic acid and homocysteine level  Vit B12 serum level (10/28/10) > 1500 pg/mL   .  Vitamin D deficiency 11/02/2010    Serum vitamin D 25(OH) = 10.9 ng/mL (30 -100) on 10/28/10 c/w Vitamin D deficiency.     . AF (paroxysmal atrial fibrillation) ([place]) 11/11/2010    Hospitalization (9/8-9/10, Dr Daneen Schick, III, Cardiology) for Paroxysmal Atrial Fibrillation with RVR and anginal pain secondary to demand/supply mismatch in setting of RVR with known circumflex artery branch disease.    . Iron deficiency anemia 08/05/2010    Dr Cristina Gong (GI) has evaluated with EGD, colonoscopy, and video capsular endoscopy in 2011 & 2012.  All have been unrevealing as to an origin of IDA.  OV with Dr Cristina Gong (10/28/10) assessment of blood in stool per hemoccult and GER. Hbg 12.1 g/dL, MCV 91.8, Ferritin 30 ng/mL. Patient taking on ferrous sulfate tab daily.   EGD on 03/06/12 by Dr Cristina Gong for IDA non-obstructing Schatzki's ring at Gastroesophageal junction, otherwise normal esophagus and stomach.     . Macular degeneration, bilateral 10/04/2010    Right eye is wet MD, the other is dry macular degeneration (ARMD). Pt undergoing some form of vascular endothelial growth factor inhibition intraocular therapy.    . VENTRICULAR HYPERTROPHY, LEFT 08/28/2008    Qualifier: Diagnosis of  By: McDiarmid MD, Sherren Mocha    . COPD 04/27/2006    Qualifier: Diagnosis of  By: McDiarmid MD, Sherren Mocha    . Solitary kidney, acquired 05/17/2010    Surgical removal for transitional cell cancer by Tresa Endo, MD ([facility]). Surveillance cystoscopy by Dr Alinda Money [facility]) on 10/19/12 without evidence of cystoscopic recurrence. Recommend RTC one year for cystoscopy.   . ADENOMATOUS COLONIC POLYP 03/01/2003    Qualifier: Diagnosis of  By: McDiarmid MD, Sherren Mocha Multiple benign polyps of cecum, ascending, transverse and sigmoid colon by 8/09 colonoscopy by Dr Cristina Gong  Colonoscopy by Dr Cristina Gong for iron-deficiency anemia on 04/27/2010 showed three sessile polyps that were in ascending (3 mm x 9 mm), transverse (4 mm), and cecum (3 mm).  All three were tubular  adenomas that were negative for high grade dysplasia or malignancy on pathology. Dr Cristina Gong called the polpys benign and not requiring follow-up in view of the patients age.    . Soft tissue injury of foot 05/03/2011  . PREDIABETES 09/11/2007    Qualifier: Diagnosis of  By: McDiarmid MD, Sherren Mocha    . Numbness and tingling in hands 07/21/2011  . MUSCLE CRAMPS 03/11/2010    Qualifier: Diagnosis of  By: McDiarmid MD, Sherren Mocha    . Leg cramps 05/29/2012  . Cellulitis of leg, right 10/01/2012  . Solar lentigo 06/15/2012  . Muscle spasm of back 09/24/2013  . Shoulder pain, left 01/09/2014  . Seborrheic keratosis, right anterior thigh 12/12/2013  . High risk medications (not anticoagulants) long-term use 03/05/2012  . CAP (community acquired pneumonia) 02/25/2015  . Decreased functional mobility  and endurance 03/17/2015   Past Surgical History  Procedure Laterality Date  . Pacemaker insertion      Dr Lovena Le ([facility])  . Nephrectomy  For transition cell cancer     Dr Tresa Endo, surgeon  . Replacement total knee  2009, Right knee    Dr Wynelle Link  . Replacement total knee  05/2009, Left knee    Dr Wynelle Link  . Knee arthroscopy w/ synovectomy  11/2009, left knee    Dr Wynelle Link  . Cholecystectomy    . Esophagogastroduodenoscopy  04/27/2010    Dr Cristina Gong - found transient H/H & Schatzki's ring  . Esophagogastroduodenoscopy endoscopy  03/06/2012    Dr Cristina Gong - found non-obstuctive Schatzki's ring at [employer]. o/w normal EGD.   Marland Kitchen Pacemaker generator change N/A 11/12/2012    Procedure: PACEMAKER GENERATOR CHANGE;  Surgeon: Evans Lance, MD;  Location: [facility] CATH LAB;  Service: Cardiovascular;  Laterality: N/A;  . Tonsillectomy    . Breast surgery      breast reduction  . Eye surgery      bilateral cataracts  . Trigger finger release Right 05/22/2014    Procedure: RIGHT HAND A-1 PULLEY RELEASE ;  Surgeon: Roseanne Kaufman, MD;  Location: [place];  Service: Orthopedics;  Laterality: Right;  . Dupuytren contracture  release Right 05/22/2014    Procedure: DUPUYTREN RELEASE AND REPAIR AS NECESSARY RIGHT RING FINGER AND MIDDLE FINGER;  Surgeon: Roseanne Kaufman, MD;  Location: [place];  Service: Orthopedics;  Laterality: Right;   Family History  Problem Relation Age of Onset  . Dementia Mother   . Heart disease Father    Social History  Substance Use Topics  . Smoking status: Former Smoker -- 1.00 packs/day    Types: Cigarettes    Quit date: 03/02/2009  . Smokeless tobacco: Never Used  . Alcohol Use: 12.0 oz/week    14 Glasses of wine, 6 Standard drinks or equivalent per week     Comment: 4-5 glasses of wine per week   OB History    No data available     Review of Systems  Constitutional: Positive for chills. Negative for fever.  HENT: Positive for congestion and rhinorrhea. Negative for sore throat.   Eyes: Negative for visual disturbance.  Respiratory: Positive for cough (yellow) and shortness of breath (used inhaler).   Cardiovascular: Negative for chest pain and leg swelling (did have swelling around right knee initially which improved).  Gastrointestinal: Negative for nausea, vomiting (did on Wednesday), abdominal pain and diarrhea.  Genitourinary: Negative for dysuria and difficulty urinating.  Musculoskeletal: Positive for arthralgias (right knee). Negative for back pain and neck pain.  Skin: Negative for rash.  Neurological: Positive for headaches (mild). Negative for syncope.  Psychiatric/Behavioral: Positive for confusion.      Allergies  Baclofen; Codeine phosphate; Norco; Hydrochlorothiazide; and Montelukast sodium  Home Medications   Prior to Admission medications   Medication Sig Start Date End Date Taking? Authorizing Provider  albuterol (VENTOLIN HFA) 108 (90 BASE) MCG/ACT inhaler Inhale 2 puffs into the lungs every 6 (six) hours as needed. If needed for shortness of breath. 02/14/14  Yes Blane Ohara McDiarmid, MD  amiodarone (PACERONE) 200 MG tablet Take 1 tablet (200 mg total)  by mouth daily. 05/18/15  Yes Evans Lance, MD  apixaban (ELIQUIS) 2.5 MG TABS tablet Take 1 tablet (2.5 mg total) by mouth 2 (two) times daily. 05/18/15  Yes Blane Ohara McDiarmid, MD  aspirin 81 MG chewable tablet Chew 81 mg by mouth  daily as needed (chest pain).   Yes Historical Provider, MD  atorvastatin (LIPITOR) 20 MG tablet Take 1 tablet (20 mg total) by mouth daily. 04/17/15  Yes Evans Lance, MD  cephALEXin (KEFLEX) 500 MG capsule Take 500 mg by mouth every 6 (six) hours. 7 day course started 06/09/15   Yes Historical Provider, MD  cholecalciferol (VITAMIN D) 400 units TABS tablet Take 400 Units by mouth daily.   Yes Historical Provider, MD  docusate sodium (COLACE) 100 MG capsule Take 100 mg by mouth daily as needed for mild constipation.   Yes Historical Provider, MD  Fluticasone-Salmeterol (ADVAIR DISKUS) 250-50 MCG/DOSE AEPB Inhale 2 puffs into the lungs 2 (two) times daily. 03/05/15  Yes Mercy Riding, MD  furosemide (LASIX) 40 MG tablet Take 20 mg by mouth every Monday, Wednesday, and Friday.   Yes Historical Provider, MD  HYDROcodone-acetaminophen (NORCO/VICODIN) 5-325 MG tablet Take 1 tablet by mouth every 6 (six) hours as needed for moderate pain.   Yes Historical Provider, MD  LORazepam (ATIVAN) 0.5 MG tablet TAKE 1 TABLET EVERY 8 HOURS AS NEEDED FOR ANXIETY OR SHORTNESS OF BREATH. Patient taking differently: TAKE 1 TABLET BY MOUTH EVERY MORNING 05/18/15  Yes Blane Ohara McDiarmid, MD  Melatonin 3 MG TABS Take 3 mg by mouth at bedtime as needed (sleep).   Yes Historical Provider, MD  metoprolol succinate (TOPROL-XL) 50 MG 24 hr tablet Take 50 mg by mouth daily. Take with or immediately following a meal.   Yes Historical Provider, MD  metoprolol tartrate (LOPRESSOR) 25 MG tablet Take 25 mg by mouth See admin instructions. Take 1 tablet by mouth for heart racing or chest pain - then call EMS   Yes Historical Provider, MD  nitroGLYCERIN (NITROSTAT) 0.4 MG SL tablet Place 0.4 mg under the tongue  every 5 (five) minutes as needed for chest pain (x 3 tabs).   Yes Historical Provider, MD  omeprazole (PRILOSEC) 20 MG capsule Take 1 capsule (20 mg total) by mouth daily. 11/07/14  Yes Blane Ohara McDiarmid, MD  tiotropium (SPIRIVA) 18 MCG inhalation capsule Place 1 capsule (18 mcg total) into inhaler and inhale daily. 01/15/15  Yes Blane Ohara McDiarmid, MD  traMADol (ULTRAM) 50 MG tablet Take 1 tablet (50 mg total) by mouth every 6 (six) hours as needed for moderate pain. 11/17/14  Yes Sela Hua, MD  diclofenac sodium (VOLTAREN) 1 % GEL Apply 2 g topically 4 (four) times daily. Patient not taking: Reported on 06/13/2015 05/21/15   Nicolette Bang, DO  metoprolol succinate (TOPROL-XL) 25 MG 24 hr tablet Take 1 tablet (25 mg total) by mouth daily. Patient not taking: Reported on 06/13/2015 05/29/15   Eileen Stanford, PA-C   BP 115/56 mmHg  Pulse 59  Temp(Src) 99.6 F (37.6 C) (Oral)  Resp 22  SpO2 98% Physical Exam  Constitutional: She is oriented to person, place, and time. She appears well-developed and well-nourished. No distress.  HENT:  Head: Normocephalic. Head is with contusion (entire face sparing nasolabial fold and extending down neck).  Eyes: Conjunctivae and EOM are normal.  Neck: Normal range of motion.  Cardiovascular: Normal rate, regular rhythm, normal heart sounds and intact distal pulses.  Exam reveals no gallop and no friction rub.   No murmur heard. Pulmonary/Chest: Effort normal and breath sounds normal. No respiratory distress. She has no wheezes. She has no rales.  Abdominal: Soft. She exhibits no distension. There is no tenderness. There is no guarding.  Musculoskeletal: She exhibits  no edema.       Right lower leg: She exhibits tenderness, swelling and laceration (skin tear right lower extremity below the knee, healing, open with steri strips in place, contusion to knee, mild erythema around the wound, warmth inferior to wound).  Neurological: She is alert and  oriented to person, place, and time.  Skin: Skin is warm and dry. No rash noted. She is not diaphoretic. No erythema.  Nursing note and vitals reviewed.    ED Course  Procedures (including critical care time) Labs Review Labs Reviewed  COMPREHENSIVE METABOLIC PANEL - Abnormal; Notable for the following:    Sodium 134 (*)    Chloride 97 (*)    Glucose, Bld 128 (*)    BUN 24 (*)    Creatinine, Ser 1.49 (*)    Calcium 8.7 (*)    Albumin 3.4 (*)    GFR calc non Af Amer 30 (*)    GFR calc Af Amer 35 (*)    All other components within normal limits  CBC - Abnormal; Notable for the following:    RBC 3.46 (*)    Hemoglobin 10.4 (*)    HCT 33.0 (*)    All other components within normal limits  URINALYSIS, ROUTINE W REFLEX MICROSCOPIC (NOT AT [facility]) - Abnormal; Notable for the following:    APPearance CLOUDY (*)    All other components within normal limits  URINE CULTURE  CULTURE, BLOOD (ROUTINE X 2)  CULTURE, BLOOD (ROUTINE X 2)  I-STAT CG4 LACTIC ACID, ED    Imaging Review Dg Chest 2 View  06/13/2015  CLINICAL DATA:  Patient with COPD an pneumonia. Status post fall. Confusion. EXAM: CHEST  2 VIEW COMPARISON:  Multiple priors dating back to 12/18/2006 FINDINGS: Dual lead pacer apparatus overlies right hemi thorax, leads are stable in position. Stable cardiomegaly. Unchanged focal opacity left mid lung most compatible with scarring. No superimposed area of pulmonary consolidation. Osseous demineralization. Thoracic spine degenerative changes. IMPRESSION: No acute cardiopulmonary process.  Cardiomegaly. Electronically Signed   By: Lovey Newcomer M.D.   On: 06/13/2015 21:29   Ct Head Wo Contrast  06/13/2015  CLINICAL DATA:  Recent fall on cruise ship 4 days prior with facial and neck ecchymosis. Anticoagulated on Eliquis. Altered mental status. EXAM: CT HEAD WITHOUT CONTRAST CT MAXILLOFACIAL WITHOUT CONTRAST CT CERVICAL SPINE WITHOUT CONTRAST TECHNIQUE: Multidetector CT imaging of the head,  cervical spine, and maxillofacial structures were performed using the standard protocol without intravenous contrast. Multiplanar CT image reconstructions of the cervical spine and maxillofacial structures were also generated. COMPARISON:  07/30/2014 head CT. FINDINGS: CT HEAD FINDINGS Midline small moderate frontal scalp contusion. No evidence of parenchymal hemorrhage or extra-axial fluid collection. No mass lesion, mass effect, or midline shift. No CT evidence of acute infarction. Intracranial atherosclerosis. Nonspecific stable mild subcortical and periventricular white matter hypodensity, most in keeping with chronic small vessel ischemic change. Generalized cerebral volume loss. No ventriculomegaly. The visualized paranasal sinuses are essentially clear. The mastoid air cells are unopacified. No evidence of calvarial fracture. CT MAXILLOFACIAL FINDINGS Small to moderate midline frontal scalp contusion. No maxillofacial fracture. The maxilla and mandible appear intact. The nasal bones are unremarkable in appearance. No dislocation at the temporomandibular joints. No residual maxillary teeth. The visualized mandibular dentition demonstrates no acute abnormality. The orbits are intact bilaterally. The visualized paranasal sinuses are essentially clear. The visualized mastoid air cells are unopacified. No aggressive appearing focal osseous lesions. The parapharyngeal fat planes are preserved. The nasopharynx, oropharynx and  hypopharynx are unremarkable in appearance. The parotid and submandibular glands are within normal limits. No cervical lymphadenopathy is seen. CT CERVICAL SPINE FINDINGS No fracture is detected in the cervical spine. No prevertebral soft tissue swelling. Normal cervical lordosis. Dens is well positioned between the lateral masses of C1. The lateral masses appear well-aligned. Moderate degenerative disc disease in the mid to lower cervical spine. Mild bilateral facet arthropathy. Mild foraminal  stenosis on the left at C5-6 No cervical spine subluxation. Visualized mastoid air cells appear clear. No evidence of intra-axial hemorrhage in the visualized brain. No gross cervical canal hematoma. No significant pulmonary nodules at the visualized lung apices. No cervical adenopathy or other significant neck soft tissue abnormality. IMPRESSION: 1. Small to moderate midline frontal scalp contusion. No evidence of acute intracranial abnormality. No calvarial fracture. 2. Generalized cerebral volume loss and mild chronic small vessel ischemia. 3. No maxillofacial fracture. 4. No fracture subluxation in the cervical spine. Moderate degenerative changes in the cervical spine. Electronically Signed   By: Ilona Sorrel M.D.   On: 06/13/2015 21:08   Ct Cervical Spine Wo Contrast  06/13/2015  CLINICAL DATA:  Recent fall on cruise ship 4 days prior with facial and neck ecchymosis. Anticoagulated on Eliquis. Altered mental status. EXAM: CT HEAD WITHOUT CONTRAST CT MAXILLOFACIAL WITHOUT CONTRAST CT CERVICAL SPINE WITHOUT CONTRAST TECHNIQUE: Multidetector CT imaging of the head, cervical spine, and maxillofacial structures were performed using the standard protocol without intravenous contrast. Multiplanar CT image reconstructions of the cervical spine and maxillofacial structures were also generated. COMPARISON:  07/30/2014 head CT. FINDINGS: CT HEAD FINDINGS Midline small moderate frontal scalp contusion. No evidence of parenchymal hemorrhage or extra-axial fluid collection. No mass lesion, mass effect, or midline shift. No CT evidence of acute infarction. Intracranial atherosclerosis. Nonspecific stable mild subcortical and periventricular white matter hypodensity, most in keeping with chronic small vessel ischemic change. Generalized cerebral volume loss. No ventriculomegaly. The visualized paranasal sinuses are essentially clear. The mastoid air cells are unopacified. No evidence of calvarial fracture. CT MAXILLOFACIAL  FINDINGS Small to moderate midline frontal scalp contusion. No maxillofacial fracture. The maxilla and mandible appear intact. The nasal bones are unremarkable in appearance. No dislocation at the temporomandibular joints. No residual maxillary teeth. The visualized mandibular dentition demonstrates no acute abnormality. The orbits are intact bilaterally. The visualized paranasal sinuses are essentially clear. The visualized mastoid air cells are unopacified. No aggressive appearing focal osseous lesions. The parapharyngeal fat planes are preserved. The nasopharynx, oropharynx and hypopharynx are unremarkable in appearance. The parotid and submandibular glands are within normal limits. No cervical lymphadenopathy is seen. CT CERVICAL SPINE FINDINGS No fracture is detected in the cervical spine. No prevertebral soft tissue swelling. Normal cervical lordosis. Dens is well positioned between the lateral masses of C1. The lateral masses appear well-aligned. Moderate degenerative disc disease in the mid to lower cervical spine. Mild bilateral facet arthropathy. Mild foraminal stenosis on the left at C5-6 No cervical spine subluxation. Visualized mastoid air cells appear clear. No evidence of intra-axial hemorrhage in the visualized brain. No gross cervical canal hematoma. No significant pulmonary nodules at the visualized lung apices. No cervical adenopathy or other significant neck soft tissue abnormality. IMPRESSION: 1. Small to moderate midline frontal scalp contusion. No evidence of acute intracranial abnormality. No calvarial fracture. 2. Generalized cerebral volume loss and mild chronic small vessel ischemia. 3. No maxillofacial fracture. 4. No fracture subluxation in the cervical spine. Moderate degenerative changes in the cervical spine. Electronically Signed   By: Corene Cornea  A Poff M.D.   On: 06/13/2015 21:08   Ct Maxillofacial Wo Cm  06/13/2015  CLINICAL DATA:  Recent fall on cruise ship 4 days prior with facial  and neck ecchymosis. Anticoagulated on Eliquis. Altered mental status. EXAM: CT HEAD WITHOUT CONTRAST CT MAXILLOFACIAL WITHOUT CONTRAST CT CERVICAL SPINE WITHOUT CONTRAST TECHNIQUE: Multidetector CT imaging of the head, cervical spine, and maxillofacial structures were performed using the standard protocol without intravenous contrast. Multiplanar CT image reconstructions of the cervical spine and maxillofacial structures were also generated. COMPARISON:  07/30/2014 head CT. FINDINGS: CT HEAD FINDINGS Midline small moderate frontal scalp contusion. No evidence of parenchymal hemorrhage or extra-axial fluid collection. No mass lesion, mass effect, or midline shift. No CT evidence of acute infarction. Intracranial atherosclerosis. Nonspecific stable mild subcortical and periventricular white matter hypodensity, most in keeping with chronic small vessel ischemic change. Generalized cerebral volume loss. No ventriculomegaly. The visualized paranasal sinuses are essentially clear. The mastoid air cells are unopacified. No evidence of calvarial fracture. CT MAXILLOFACIAL FINDINGS Small to moderate midline frontal scalp contusion. No maxillofacial fracture. The maxilla and mandible appear intact. The nasal bones are unremarkable in appearance. No dislocation at the temporomandibular joints. No residual maxillary teeth. The visualized mandibular dentition demonstrates no acute abnormality. The orbits are intact bilaterally. The visualized paranasal sinuses are essentially clear. The visualized mastoid air cells are unopacified. No aggressive appearing focal osseous lesions. The parapharyngeal fat planes are preserved. The nasopharynx, oropharynx and hypopharynx are unremarkable in appearance. The parotid and submandibular glands are within normal limits. No cervical lymphadenopathy is seen. CT CERVICAL SPINE FINDINGS No fracture is detected in the cervical spine. No prevertebral soft tissue swelling. Normal cervical  lordosis. Dens is well positioned between the lateral masses of C1. The lateral masses appear well-aligned. Moderate degenerative disc disease in the mid to lower cervical spine. Mild bilateral facet arthropathy. Mild foraminal stenosis on the left at C5-6 No cervical spine subluxation. Visualized mastoid air cells appear clear. No evidence of intra-axial hemorrhage in the visualized brain. No gross cervical canal hematoma. No significant pulmonary nodules at the visualized lung apices. No cervical adenopathy or other significant neck soft tissue abnormality. IMPRESSION: 1. Small to moderate midline frontal scalp contusion. No evidence of acute intracranial abnormality. No calvarial fracture. 2. Generalized cerebral volume loss and mild chronic small vessel ischemia. 3. No maxillofacial fracture. 4. No fracture subluxation in the cervical spine. Moderate degenerative changes in the cervical spine. Electronically Signed   By: Ilona Sorrel M.D.   On: 06/13/2015 21:08   I have personally reviewed and evaluated these images and lab results as part of my medical decision-making.   EKG Interpretation None      ECG (will not upload to MUSE) EKG HR 61 sinus rhythm Nonspecific IVCD with LAD L ventricular hypertrophy No significant change since prior ECG  MDM   Final diagnoses:  Cellulitis of right lower extremity  Elevated temperature  Delirium   82 year old female with a history of COPD, coronary artery disease on Eliquis, sick sinus syndrome with tachycardia, CKD, hypertension presents with concern for confusion in setting of having a fall while on a cruise on Tuesday, developing nausea vomiting the day after that, cough today, and cellulitis around leg wound from fall on Tuesday for which she has been on keflex.  Regarding the fall, patient had CTs of the head, face and cervical spine which all showed no acute fracture or intracranial bleed. Patient does have significant contusions of the face,  likely secondary  to Eliquis. Family denies any focal neurologic deficits, however report some confusion this morning. At this time she is at baseline. Patient has temperature 100.0 in the emergency department, with potential etiology of his elevated temperature measurement status as infection from the right lower extremity. Urinalysis showed no signs of infection. Chest x-ray did not show signs of pneumonia, however patient does report cough beginning today, and other etiologies of these symptoms may be a viral URI or pneumonia without radiologic evidence at this time. Patient was initially given vancomycin and Zosyn for concern for skin infection (for anaerobic/gram neg coverage) and possible UTI/pneumonia. There is not significant erythema surrounding the wound however there is warmth and tenderness, and given elevated temperature, waxing/waning mental status with confusion at home concerning for possible delirium, family concerns, and failure of outpt abx, will admit for observation to family medicine for concern of cellulitis and possible delirium.    Gareth Morgan, MD 06/14/15 [phone]

## 2015-06-13 NOTE — Progress Notes (Signed)
Pharmacy Antibiotic Note  [name] is a 82 y.o. female admitted on 06/13/2015 with Wound infection.  Pharmacy has been consulted for Vancomycin dosing. Pt fell while on a cruise. WBC WNL. Noted renal dysfunction.   Plan: -Vancomycin 750 mg IV q24h -Trend WBC, temp, renal function -Drug levels as indicated   Temp (24hrs), Avg:99.6 F (37.6 C), Min:99.3 F (37.4 C), Max:100 F (37.8 C)   Recent Labs Lab 06/13/15 1903 06/13/15 2116  WBC 8.8  --   CREATININE 1.49*  --   LATICACIDVEN  --  1.12    CrCl cannot be calculated (Unknown ideal weight.).    Allergies  Allergen Reactions  . Baclofen Other (See Comments)    Confusion occurred with taking 3 at the same time.  . Codeine Phosphate Nausea Only  . Norco [Hydrocodone-Acetaminophen] Nausea And Vomiting  . Hydrochlorothiazide Other (See Comments)    Possible acute gout or arthritis of wrist  . Montelukast Sodium Other (See Comments)    Unspecified reaction.     Narda Bonds 06/13/2015 11:27 PM

## 2015-06-13 NOTE — H&P (Signed)
[facility] Admission History and Physical Service Pager: [phone]  Patient name: JALESA BIGGER Medical record number: [id number] Date of birth: 10-Dec-1923 Age: 82 y.o. Gender: female  Primary Care Provider: MCDIARMID,TODD D, MD Consultants: none Code Status: Full  Chief Complaint: cough, confusion  Assessment and Plan: SHEQUITTA RATHS is a 82 y.o. female presenting with confusion, cough, and SOB. PMH is significant for COPD, AFib, HTN, COPD, CKD Stage III, macular degeneration, sick sinus syndrome w/ pacemaker, and CAD s/p PCI.   # Confusion/acute encephalopathy; resolved: Patient is alert and oriented on admission, no focal deficits. Some occasional memory deficits. Speech clear. No clear etiology or level of significance as confusion had resolved prior to being seen by any healthcare provider. Likely age-related versus dementia versus dehydration versus medication related. Of note: Temporary hypoxia/hypercarbia may also cause an altered mental status (see "Dyspnea" below).  - Observation under family medicine teaching service: Attending physician Dr. Andria Frames - Anticipating family to have many questions about significance and etiology of these symptoms - Further investigate cause of initial fall on cruise ship. - We'll monitor  # Dyspnea likely 2/2 COPD exacerbation: Patient has new O2 requirement of 4 L in ED. At baseline patient is no longer on supplemental O2 (however patient had been requiring 2 L at home in the past). Sats in high 90's. No evidence of respiratory distress but patient does have new productive cough. Bilateral wheezes on exam. Chest x-ray clear. May be contributing to temporary confusion. Differential also includes pneumonia versus PE versus cardiogenic. - Supplemental O2 PRN; plan to wean off as soon as tolerated  - Patient started on vancomycin/Zosyn in ED; discontinue - Doxycycline 100 mg twice a day; initiate - Prednisone 50 mg daily;  initiate - Albuterol inhaler every 6 hours when necessary - Dulera inhaler (substituted for Advair) - Continue home Spiriva - Continue to monitor   # Facial trauma with soft tissue ecchymoses: Patient on chronic Eliquis due to history of A. fib and sick sinus syndrome. Likely a major contributing factor to the extensive nature of her ecchymotic status. CT head/C-spine/maxillofacial performed in ED were able to effectively rule out fractures, displacement, or resolving intracranial bleeds secondary to this trauma. - Monitor for pain and discomfort - Daily CBCs to ensure homeostasis. - Continue Eliquis - Consult wound care for laceration on lower extremity  # H/o AFib and Sick Sinus Syndrome: Pacemaker in place; paced rhythm. No evidence of Afib in ED. On long term anticoagulation for Afib in the past. Recently changed to Eliquis. Consider episode of atrial fibrillation as a possible cause of fall and cruciate. - continue amiodarone 200 mg daily - Hold metoprolol due to soft BP - Cycle troponins - BNP - continue home Eliquis  # CAD s/p PCI with h/o MI - continue atorvastatin 20 mg daily   # CKD Stage III: Solitary kidney. Patient has h/o transitional cell cancer. Baseline Cr ~1.3. Cr of 1.49 at admission suggesting slight bump. - BMET in AM  - Monitor Cr   # H/o HTN:  - Continue to monitor  - Holding Metoprolol    # Anxiety: Stable.  - continue home Ativan 0.5 mg bid prn   # Osteoarthritis:  - continue Tramadol 50 mg q6h prn   FEN/GI: heart healthy; NS @ 176ml/hr Prophylaxis: Eliquis  Disposition: place in observation  History of Present Illness:  THEONA HIGBIE is a 82 y.o. female presenting with cough, shortness of breath, and confusion.   Patient  has recently returned from a cruise with her family last week. During the cruise she sustained a fall in which she endorses hitting her head but did not lose consciousness. She sustained significant soft tissue injuries from  this fall and had extensive ecchymoses on her head face and neck (Patient on Eliquis). She was seen by the cruise physician and started on by mouth Keflex for a laceration she sustained on her right lower extremity. She states that prior to this fall she had felt lightheaded without vertigo and was unsure whether or not she had just stood up from a seated position. After the initial injury patient states that she was not experiencing any unexpected residual symptoms.  Today she has reported to the ED due to some episodes of confusion witnessed by her daughters. She states that she also has had some increased shortness of breath and cough since returning from the cruise ship this week. She states that she knows she was confused but is unable to give [place] further details. Family is not present during admission. Patient states that her shortness of breath has gradually gotten worse over the past few days. She is no longer requiring supplemental oxygen at baseline but was currently on 4 L nasal cannula and satting in the mid 90s.  Patient states that other than the symptoms listed below she feels well. She denies any fevers, chills, nausea, vomiting or diarrhea, chest pain, dysuria. She has had no increased swelling in her lower extremities. Patient was brought in under observation for further workup.   Review Of Systems: Per HPI Otherwise the remainder of the systems were negative.  Patient Active Problem List   Diagnosis Date Noted  . Chest pain with moderate risk of acute coronary syndrome 05/21/2015  . Diastolic dysfunction   . History of iron deficiency 01/16/2015  . Pure hypercholesterolemia 07/31/2014  . CAD S/P RCA BMS, residual CFX disease 06/08/2014  . Pacemaker 04/02/2013  . Mild cognitive impairment 10/26/2012  . Transitional cell carcinoma of ureter, history   . Chronic anticoagulation 10/01/2012  . Urge incontinence 12/13/2011  . AF (paroxysmal atrial fibrillation) ([place]) 11/11/2010  .  Vitamin D deficiency 11/02/2010  . Macular degeneration, bilateral 10/04/2010    Class: Chronic  . Solitary kidney, acquired 05/17/2010  . Spondylolisthesis of lumbar region   . VITAMIN B12 DEFICIENCY 10/07/2009  . Chronic kidney disease (CKD), stage IV (severe) ([place]) 09/02/2009  . MYOCARDIAL INFARCTION, HX OF 09/25/2008  . VENTRICULAR HYPERTROPHY, LEFT 08/28/2008  . At risk for diabetes mellitus 09/11/2007  . ANXIETY 04/27/2006  . SICK SINUS SYNDROME 04/27/2006  . COPD, severe ([place]) 04/27/2006  . GASTROESOPHAGEAL REFLUX, NO ESOPHAGITIS 04/27/2006  . Osteoarthrosis involving lower leg 04/27/2006  . OSTEOARTHRITIS OF SPINE, NOS 04/27/2006  . LUMBAR SPINAL STENOSIS 04/27/2006    Past Medical History: Past Medical History  Diagnosis Date  . Candidiasis of the esophagus 11/26/2007  . Myocardial infarct, old   . COPD, severe   . Sick sinus syndrome with tachycardia ([place])   . Pacemaker   . Chronic kidney disease (CKD), stage III (moderate)   . Hypertension   . Acute appendicitis with rupture   . Spinal stenosis, lumbar   . Adrenal adenoma     Incidentaloma  . Lumbar herniated disc     History of HNP L4/5 in 2003  . Hx of colonoscopy with polypectomy 04/27/2010    Dr Cristina Gong found three  tubular adenomas each less than 10 mm size  . Retinal hemorrhage of  left eye 06/2010  . Cataract 2013    Bilateral   . Abnormal mammogram, unspecified 08/23/2010    Followup imaging reassuring.  Repeat in 6 months.   Marland Kitchen DISC WITH RADICULOPATHY 04/27/2006    Qualifier: Diagnosis of  By: McDiarmid MD, Sherren Mocha    . Transitional cell carcinoma of ureter, history   . Mild cognitive impairment 10/26/2012    (10/25/12) Failed MiniCog screen  . 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 Normal uric acid serum level at time of attack.    Marland Kitchen EDEMA-LEGS,DUE TO VENOUS OBSTRUCT. 04/27/2006    Qualifier: Diagnosis of  By: McDiarmid MD, Sherren Mocha     . ANXIETY 04/27/2006    Qualifier: Diagnosis of  By: McDiarmid MD, Sherren Mocha    . HERNIA, HIATAL, NONCONGENITAL 04/27/2006    Qualifier: Diagnosis of  By: McDiarmid MD, Sherren Mocha    . History of Hemorrhoids 04/27/2006    Qualifier: Diagnosis of  By: McDiarmid MD, Sherren Mocha    . RHINITIS, ALLERGIC 04/27/2006    Qualifier: Diagnosis of  By: McDiarmid MD, Sherren Mocha    . SCHATZKI'S RING, HX OF 11/26/2007    Qualifier: Diagnosis of  By: McDiarmid MD, Sherren Mocha  An EGD was performed by Dr Cristina Gong on 04/27/2010 for iron deficiency anemia. There was a a transient hiatal hernia with Schatzki's ring. Stomach and duodenum were normal. EGD on 03/06/12 by Dr Cristina Gong for IDA non-obstructing Schatzki's ring at Gastroesophageal junction, otherwise normal esophagus and stomach.    . Urge incontinence 12/13/2011    Diagnosed in 10/2011 by Dr Bjorn Loser (Urology)   . VITAMIN B12 DEFICIENCY 10/07/2009    Qualifier: Diagnosis of  By: McDiarmid MD, Sherren Mocha  Dx based on a post-TKR anemia work-up Low normal serum B12 with high Methylmalonic acid and homocysteine level  Vit B12 serum level (10/28/10) > 1500 pg/mL   . Vitamin D deficiency 11/02/2010    Serum vitamin D 25(OH) = 10.9 ng/mL (30 -100) on 10/28/10 c/w Vitamin D deficiency.     . AF (paroxysmal atrial fibrillation) ([place]) 11/11/2010    Hospitalization (9/8-9/10, Dr Daneen Schick, III, Cardiology) for Paroxysmal Atrial Fibrillation with RVR and anginal pain secondary to demand/supply mismatch in setting of RVR with known circumflex artery branch disease.    . Iron deficiency anemia 08/05/2010    Dr Cristina Gong (GI) has evaluated with EGD, colonoscopy, and video capsular endoscopy in 2011 & 2012.  All have been unrevealing as to an origin of IDA.  OV with Dr Cristina Gong (10/28/10) assessment of blood in stool per hemoccult and GER. Hbg 12.1 g/dL, MCV 91.8, Ferritin 30 ng/mL. Patient taking on ferrous sulfate tab daily.   EGD on 03/06/12 by Dr Cristina Gong for IDA non-obstructing Schatzki's ring at Gastroesophageal  junction, otherwise normal esophagus and stomach.     . Macular degeneration, bilateral 10/04/2010    Right eye is wet MD, the other is dry macular degeneration (ARMD). Pt undergoing some form of vascular endothelial growth factor inhibition intraocular therapy.    . VENTRICULAR HYPERTROPHY, LEFT 08/28/2008    Qualifier: Diagnosis of  By: McDiarmid MD, Sherren Mocha    . COPD 04/27/2006    Qualifier: Diagnosis of  By: McDiarmid MD, Sherren Mocha    . Solitary kidney, acquired 05/17/2010    Surgical removal for transitional cell cancer by Tresa Endo, MD ([place]). Surveillance cystoscopy by Dr Alinda Money [facility]) on 10/19/12 without evidence of cystoscopic recurrence. Recommend RTC  one year for cystoscopy.   . ADENOMATOUS COLONIC POLYP 03/01/2003    Qualifier: Diagnosis of  By: McDiarmid MD, Sherren Mocha Multiple benign polyps of cecum, ascending, transverse and sigmoid colon by 8/09 colonoscopy by Dr Cristina Gong  Colonoscopy by Dr Cristina Gong for iron-deficiency anemia on 04/27/2010 showed three sessile polyps that were in ascending (3 mm x 9 mm), transverse (4 mm), and cecum (3 mm).  All three were tubular adenomas that were negative for high grade dysplasia or malignancy on pathology. Dr Cristina Gong called the polpys benign and not requiring follow-up in view of the patients age.    . Soft tissue injury of foot 05/03/2011  . PREDIABETES 09/11/2007    Qualifier: Diagnosis of  By: McDiarmid MD, Sherren Mocha    . Numbness and tingling in hands 07/21/2011  . MUSCLE CRAMPS 03/11/2010    Qualifier: Diagnosis of  By: McDiarmid MD, Sherren Mocha    . Leg cramps 05/29/2012  . Cellulitis of leg, right 10/01/2012  . Solar lentigo 06/15/2012  . Muscle spasm of back 09/24/2013  . Shoulder pain, left 01/09/2014  . Seborrheic keratosis, right anterior thigh 12/12/2013  . High risk medications (not anticoagulants) long-term use 03/05/2012  . CAP (community acquired pneumonia) 02/25/2015  . Decreased functional mobility and endurance 03/17/2015    Past Surgical History: Past  Surgical History  Procedure Laterality Date  . Pacemaker insertion      Dr Lovena Le ([facility])  . Nephrectomy  For transition cell cancer     Dr Tresa Endo, surgeon  . Replacement total knee  2009, Right knee    Dr Wynelle Link  . Replacement total knee  05/2009, Left knee    Dr Wynelle Link  . Knee arthroscopy w/ synovectomy  11/2009, left knee    Dr Wynelle Link  . Cholecystectomy    . Esophagogastroduodenoscopy  04/27/2010    Dr Cristina Gong - found transient H/H & Schatzki's ring  . Esophagogastroduodenoscopy endoscopy  03/06/2012    Dr Cristina Gong - found non-obstuctive Schatzki's ring at [employer] [facility]. o/w normal EGD.   Marland Kitchen Pacemaker generator change N/A 11/12/2012    Procedure: PACEMAKER GENERATOR CHANGE;  Surgeon: Evans Lance, MD;  Location: [facility] CATH LAB;  Service: Cardiovascular;  Laterality: N/A;  . Tonsillectomy    . Breast surgery      breast reduction  . Eye surgery      bilateral cataracts  . Trigger finger release Right 05/22/2014    Procedure: RIGHT HAND A-1 PULLEY RELEASE ;  Surgeon: Roseanne Kaufman, MD;  Location: [place];  Service: Orthopedics;  Laterality: Right;  . Dupuytren contracture release Right 05/22/2014    Procedure: DUPUYTREN RELEASE AND REPAIR AS NECESSARY RIGHT RING FINGER AND MIDDLE FINGER;  Surgeon: Roseanne Kaufman, MD;  Location: [place];  Service: Orthopedics;  Laterality: Right;    Social History: Social History  Substance Use Topics  . Smoking status: Former Smoker -- 1.00 packs/day    Types: Cigarettes    Quit date: 03/02/2009  . Smokeless tobacco: Never Used  . Alcohol Use: 12.0 oz/week    14 Glasses of wine, 6 Standard drinks or equivalent per week     Comment: 4-5 glasses of wine per week    Please also refer to relevant sections of EMR.  Family History: Family History  Problem Relation Age of Onset  . Dementia Mother   . Heart disease Father     Allergies and Medications: Allergies  Allergen Reactions  . Baclofen Other (See Comments)    Confusion occurred  with taking  3 at the same time.  . Codeine Phosphate Nausea Only  . Norco [Hydrocodone-Acetaminophen] Nausea And Vomiting  . Hydrochlorothiazide Other (See Comments)    Possible acute gout or arthritis of wrist  . Montelukast Sodium Other (See Comments)    Unspecified reaction.    No current facility-administered medications on file prior to encounter.   Current Outpatient Prescriptions on File Prior to Encounter  Medication Sig Dispense Refill  . albuterol (VENTOLIN HFA) 108 (90 BASE) MCG/ACT inhaler Inhale 2 puffs into the lungs every 6 (six) hours as needed. If needed for shortness of breath. 1 Inhaler 3  . amiodarone (PACERONE) 200 MG tablet Take 1 tablet (200 mg total) by mouth daily. 90 tablet 3  . apixaban (ELIQUIS) 2.5 MG TABS tablet Take 1 tablet (2.5 mg total) by mouth 2 (two) times daily. 60 tablet 5  . atorvastatin (LIPITOR) 20 MG tablet Take 1 tablet (20 mg total) by mouth daily. 30 tablet 11  . Fluticasone-Salmeterol (ADVAIR DISKUS) 250-50 MCG/DOSE AEPB Inhale 2 puffs into the lungs 2 (two) times daily. 60 each 11  . LORazepam (ATIVAN) 0.5 MG tablet TAKE 1 TABLET EVERY 8 HOURS AS NEEDED FOR ANXIETY OR SHORTNESS OF BREATH. (Patient taking differently: TAKE 1 TABLET BY MOUTH EVERY MORNING) 30 tablet 5  . nitroGLYCERIN (NITROSTAT) 0.4 MG SL tablet Place 0.4 mg under the tongue every 5 (five) minutes as needed for chest pain (x 3 tabs).    Marland Kitchen omeprazole (PRILOSEC) 20 MG capsule Take 1 capsule (20 mg total) by mouth daily. 90 capsule 3  . tiotropium (SPIRIVA) 18 MCG inhalation capsule Place 1 capsule (18 mcg total) into inhaler and inhale daily. 30 capsule PRN  . traMADol (ULTRAM) 50 MG tablet Take 1 tablet (50 mg total) by mouth every 6 (six) hours as needed for moderate pain. 30 tablet 1  . diclofenac sodium (VOLTAREN) 1 % GEL Apply 2 g topically 4 (four) times daily. (Patient not taking: Reported on 06/13/2015) 100 g 0  . metoprolol succinate (TOPROL-XL) 25 MG 24 hr tablet Take 1  tablet (25 mg total) by mouth daily. (Patient not taking: Reported on 06/13/2015) 90 tablet 3    Objective: BP 107/48 mmHg  Pulse 65  Temp(Src) 99.6 F (37.6 C) (Oral)  Resp 20  SpO2 98% Exam: General -- oriented x3, pleasant and cooperative. Elderly HEENT -- Head is normocephalic. PERRLA. EOMI. Subconjunctival hemorrhage of left eye. Ears, nose and throat were benign. Substantial ecchymosis throughout the forehead, face, and neck with some sparing around the chin. Neck -- supple; no bruits. No JVD, no LAD Chest -- good expansion. Wheezes noted bilaterally. Productive cough noted.  Cardiac -- RRR. No murmurs noted. (Paced rhythm) Abdomen -- soft, nontender. No masses palpable. Bowel sounds present. CNS -- cranial nerves II through XII grossly intact. 2+ reflexes bilaterally. Extremeties - left skin tear/laceration noted on right lower extremity just inferior to the tibial tuberosity (~5x8cm area). No effusions noted. ROM good. 5/5 bilateral strength. Dorsalis pedis pulses present and symmetrical.   Labs and Imaging: CBC BMET   Recent Labs Lab 06/13/15 1903  WBC 8.8  HGB 10.4*  HCT 33.0*  PLT 194    Recent Labs Lab 06/13/15 1903  NA 134*  K 4.2  CL 97*  CO2 24  BUN 24*  CREATININE 1.49*  GLUCOSE 128*  CALCIUM 8.7*       Elberta Leatherwood, MD 06/14/2015, 1:07 AM PGY-2, [facility] pager: [phone], text pages welcome

## 2015-06-14 ENCOUNTER — Observation Stay (HOSPITAL_COMMUNITY): Payer: Medicare Other

## 2015-06-14 DIAGNOSIS — J441 Chronic obstructive pulmonary disease with (acute) exacerbation: Secondary | ICD-10-CM | POA: Diagnosis not present

## 2015-06-14 DIAGNOSIS — I517 Cardiomegaly: Secondary | ICD-10-CM | POA: Diagnosis not present

## 2015-06-14 DIAGNOSIS — I480 Paroxysmal atrial fibrillation: Secondary | ICD-10-CM | POA: Diagnosis not present

## 2015-06-14 DIAGNOSIS — S060X9S Concussion with loss of consciousness of unspecified duration, sequela: Secondary | ICD-10-CM | POA: Diagnosis not present

## 2015-06-14 DIAGNOSIS — S060X9A Concussion with loss of consciousness of unspecified duration, initial encounter: Secondary | ICD-10-CM | POA: Insufficient documentation

## 2015-06-14 DIAGNOSIS — I495 Sick sinus syndrome: Secondary | ICD-10-CM | POA: Diagnosis not present

## 2015-06-14 DIAGNOSIS — R0600 Dyspnea, unspecified: Secondary | ICD-10-CM | POA: Diagnosis present

## 2015-06-14 DIAGNOSIS — I129 Hypertensive chronic kidney disease with stage 1 through stage 4 chronic kidney disease, or unspecified chronic kidney disease: Secondary | ICD-10-CM | POA: Diagnosis not present

## 2015-06-14 LAB — CBC
HEMATOCRIT: 33 % — AB (ref 36.0–46.0)
Hemoglobin: 10.3 g/dL — ABNORMAL LOW (ref 12.0–15.0)
MCH: 29.9 pg (ref 26.0–34.0)
MCHC: 31.2 g/dL (ref 30.0–36.0)
MCV: 95.9 fL (ref 78.0–100.0)
PLATELETS: 190 10*3/uL (ref 150–400)
RBC: 3.44 MIL/uL — ABNORMAL LOW (ref 3.87–5.11)
RDW: 13.2 % (ref 11.5–15.5)
WBC: 7.6 10*3/uL (ref 4.0–10.5)

## 2015-06-14 LAB — BASIC METABOLIC PANEL
Anion gap: 11 (ref 5–15)
BUN: 21 mg/dL — AB (ref 6–20)
CO2: 29 mmol/L (ref 22–32)
CREATININE: 1.59 mg/dL — AB (ref 0.44–1.00)
Calcium: 8.7 mg/dL — ABNORMAL LOW (ref 8.9–10.3)
Chloride: 98 mmol/L — ABNORMAL LOW (ref 101–111)
GFR calc Af Amer: 32 mL/min — ABNORMAL LOW (ref >60)
GFR calc non Af Amer: 28 mL/min — ABNORMAL LOW (ref >60)
GLUCOSE: 107 mg/dL — AB (ref 65–99)
Potassium: 3.9 mmol/L (ref 3.5–5.1)
Sodium: 138 mmol/L (ref 135–145)

## 2015-06-14 LAB — TROPONIN I
TROPONIN I: 0.03 ng/mL (ref <0.031)
TROPONIN I: 0.03 ng/mL (ref <0.031)
Troponin I: 0.04 ng/mL — ABNORMAL HIGH (ref <0.031)

## 2015-06-14 LAB — PROTIME-INR
INR: 1.27 (ref 0.00–1.49)
Prothrombin Time: 16 seconds — ABNORMAL HIGH (ref 11.6–15.2)

## 2015-06-14 LAB — BRAIN NATRIURETIC PEPTIDE: B NATRIURETIC PEPTIDE 5: 744.8 pg/mL — AB (ref 0.0–100.0)

## 2015-06-14 MED ORDER — LORAZEPAM 0.5 MG PO TABS: 0.5000 mg | ORAL_TABLET | Freq: Two times a day (BID) | ORAL | Status: DC | PRN

## 2015-06-14 MED ORDER — SODIUM CHLORIDE 0.9 % IV SOLN
INTRAVENOUS | Status: DC
  Administered 2015-06-14: 08:00:00 via INTRAVENOUS

## 2015-06-14 MED ORDER — PANTOPRAZOLE SODIUM 40 MG PO TBEC
40.0000 mg | DELAYED_RELEASE_TABLET | Freq: Every day | ORAL | Status: DC
Start: 2015-06-14 — End: 2015-06-15
  Administered 2015-06-14 – 2015-06-15 (×2): 40 mg via ORAL
  Filled 2015-06-14 (×2): qty 1

## 2015-06-14 MED ORDER — MOMETASONE FURO-FORMOTEROL FUM 200-5 MCG/ACT IN AERO
2.0000 | INHALATION_SPRAY | Freq: Two times a day (BID) | RESPIRATORY_TRACT | Status: DC
  Administered 2015-06-14 – 2015-06-15 (×3): 2 via RESPIRATORY_TRACT
  Filled 2015-06-14: qty 8.8

## 2015-06-14 MED ORDER — PREDNISONE 50 MG PO TABS
50.0000 mg | ORAL_TABLET | Freq: Every day | ORAL | Status: DC
  Administered 2015-06-14: 50 mg via ORAL
  Filled 2015-06-14: qty 1

## 2015-06-14 MED ORDER — DOCUSATE SODIUM 100 MG PO CAPS: 100.0000 mg | ORAL_CAPSULE | Freq: Every day | ORAL | Status: DC | PRN

## 2015-06-14 MED ORDER — DOXYCYCLINE HYCLATE 100 MG PO TABS
100.0000 mg | ORAL_TABLET | Freq: Two times a day (BID) | ORAL | Status: DC
  Administered 2015-06-14 – 2015-06-15 (×3): 100 mg via ORAL
  Filled 2015-06-14 (×3): qty 1

## 2015-06-14 MED ORDER — FUROSEMIDE 20 MG PO TABS
20.0000 mg | ORAL_TABLET | Freq: Every day | ORAL | Status: DC
  Administered 2015-06-14 – 2015-06-15 (×2): 20 mg via ORAL
  Filled 2015-06-14 (×2): qty 1

## 2015-06-14 MED ORDER — ALBUTEROL SULFATE (2.5 MG/3ML) 0.083% IN NEBU
3.0000 mL | INHALATION_SOLUTION | Freq: Four times a day (QID) | RESPIRATORY_TRACT | Status: DC | PRN
  Administered 2015-06-14: 3 mL via RESPIRATORY_TRACT
  Filled 2015-06-14: qty 3

## 2015-06-14 MED ORDER — METOPROLOL TARTRATE 25 MG PO TABS
25.0000 mg | ORAL_TABLET | Freq: Two times a day (BID) | ORAL | Status: DC
  Administered 2015-06-14 – 2015-06-15 (×3): 25 mg via ORAL
  Filled 2015-06-14 (×3): qty 1

## 2015-06-14 MED ORDER — TRAMADOL HCL 50 MG PO TABS: 50.0000 mg | ORAL_TABLET | Freq: Four times a day (QID) | ORAL | Status: DC | PRN

## 2015-06-14 MED ORDER — FUROSEMIDE 20 MG PO TABS: 20.0000 mg | ORAL_TABLET | ORAL | Status: DC

## 2015-06-14 MED ORDER — IPRATROPIUM-ALBUTEROL 0.5-2.5 (3) MG/3ML IN SOLN
3.0000 mL | Freq: Four times a day (QID) | RESPIRATORY_TRACT | Status: DC
  Administered 2015-06-14 – 2015-06-15 (×5): 3 mL via RESPIRATORY_TRACT
  Filled 2015-06-14 (×5): qty 3

## 2015-06-14 MED ORDER — GUAIFENESIN ER 600 MG PO TB12
600.0000 mg | ORAL_TABLET | Freq: Two times a day (BID) | ORAL | Status: DC | PRN
  Administered 2015-06-14 – 2015-06-15 (×2): 600 mg via ORAL
  Filled 2015-06-14 (×2): qty 1

## 2015-06-14 MED ORDER — APIXABAN 2.5 MG PO TABS
2.5000 mg | ORAL_TABLET | Freq: Two times a day (BID) | ORAL | Status: DC
  Administered 2015-06-14 – 2015-06-15 (×3): 2.5 mg via ORAL
  Filled 2015-06-14 (×3): qty 1

## 2015-06-14 MED ORDER — AMIODARONE HCL 200 MG PO TABS
200.0000 mg | ORAL_TABLET | Freq: Every day | ORAL | Status: DC
  Administered 2015-06-14 – 2015-06-15 (×2): 200 mg via ORAL
  Filled 2015-06-14 (×2): qty 1

## 2015-06-14 MED ORDER — CHOLECALCIFEROL 10 MCG (400 UNIT) PO TABS
400.0000 [IU] | ORAL_TABLET | Freq: Every day | ORAL | Status: DC
  Administered 2015-06-14 – 2015-06-15 (×2): 400 [IU] via ORAL
  Filled 2015-06-14 (×3): qty 1

## 2015-06-14 MED ORDER — ATORVASTATIN CALCIUM 20 MG PO TABS
20.0000 mg | ORAL_TABLET | Freq: Every day | ORAL | Status: DC
  Administered 2015-06-14: 20 mg via ORAL
  Filled 2015-06-14: qty 1

## 2015-06-14 MED ORDER — ALBUTEROL SULFATE (2.5 MG/3ML) 0.083% IN NEBU: 3.0000 mL | INHALATION_SOLUTION | RESPIRATORY_TRACT | Status: DC | PRN

## 2015-06-14 MED ORDER — TIOTROPIUM BROMIDE MONOHYDRATE 18 MCG IN CAPS
18.0000 ug | ORAL_CAPSULE | Freq: Every day | RESPIRATORY_TRACT | Status: DC
  Administered 2015-06-14 – 2015-06-15 (×2): 18 ug via RESPIRATORY_TRACT
  Filled 2015-06-14 (×2): qty 5

## 2015-06-14 MED ORDER — SODIUM CHLORIDE 0.9% FLUSH
3.0000 mL | Freq: Two times a day (BID) | INTRAVENOUS | Status: DC
  Administered 2015-06-14 – 2015-06-15 (×2): 3 mL via INTRAVENOUS

## 2015-06-14 MED ORDER — PREDNISONE 50 MG PO TABS
50.0000 mg | ORAL_TABLET | Freq: Every day | ORAL | Status: DC
  Administered 2015-06-15: 50 mg via ORAL
  Filled 2015-06-14: qty 1

## 2015-06-14 MED ORDER — NITROGLYCERIN 0.4 MG SL SUBL: 0.4000 mg | SUBLINGUAL_TABLET | SUBLINGUAL | Status: DC | PRN

## 2015-06-14 NOTE — Evaluation (Signed)
Physical Therapy Evaluation Patient Details Name: [name] MRN: [id number] DOB: 1923-03-30 Today's Date: 06/14/2015   History of Present Illness  Patient is an 81 yo female admitted 06/13/15 with dyspnea - COPD exacerbation.  Patient with recent fall on cruise ship - bruising, Rt knee wound.  Patient does not remember most of this incident.  PMH:  COPD, Afib, HTN, CKD, macular degeneration, pacemaker, CAD, MI, OA  Clinical Impression  Patient presents with problems listed below.  Will benefit from acute PT to maximize functional mobility prior to discharge.  Recommend HHPT at discharge for continued therapy.  Recommend patient use RW at all times.    Follow Up Recommendations Home health PT;Supervision for mobility/OOB    Equipment Recommendations  None recommended by PT    Recommendations for Other Services       Precautions / Restrictions Precautions Precautions: Fall Precaution Comments: Prior falls Restrictions Weight Bearing Restrictions: No      Mobility  Bed Mobility Overal bed mobility: Modified Independent             General bed mobility comments: Increased time  Transfers Overall transfer level: Needs assistance Equipment used: Rolling walker (2 wheeled) Transfers: Sit to/from Stand Sit to Stand: Supervision         General transfer comment: Patient using correct hand placement.  Assist for safety.  Ambulation/Gait Ambulation/Gait assistance: Min guard Ambulation Distance (Feet): 30 Feet Assistive device: Rolling walker (2 wheeled) Gait Pattern/deviations: Step-through pattern;Decreased stride length;Shuffle;Trunk flexed   Gait velocity interpretation: at or above normal speed for age/gender General Gait Details: Safe use of RW.  Assist for safety/balance.  Slightly unsteady.  Stairs            Wheelchair Mobility    Modified Rankin (Stroke Patients Only)       Balance Overall balance assessment: Needs assistance Sitting-balance  support: No upper extremity supported;Feet supported Sitting balance-Leahy Scale: Good     Standing balance support: No upper extremity supported Standing balance-Leahy Scale: Fair Standing balance comment: Requires UE support for dynamic activities/gait.                             Pertinent Vitals/Pain Pain Assessment: No/denies pain    Home Living Family/patient expects to be discharged to:: Private residence Living Arrangements: Children (Daughter) Available Help at Discharge: Family;Friend(s);Available PRN/intermittently (Daughter in home works; 2 daughters local and assist) Type of Home: House Home Access: Stairs to enter Entrance Stairs-Rails: Psychiatric nurse of Steps: 3 Home Layout: Two level;Full bath on main level;Bed/bath upstairs Home Equipment: Walker - 4 wheels;Shower seat      Prior Function Level of Independence: Independent with assistive device(s)         Comments: Patient reports she uses her RW for longer distances.  Patient drives.     Hand Dominance   Dominant Hand: Right    Extremity/Trunk Assessment   Upper Extremity Assessment: Generalized weakness           Lower Extremity Assessment: Generalized weakness (Wound Rt knee)         Communication   Communication: No difficulties  Cognition Arousal/Alertness: Awake/alert Behavior During Therapy: WFL for tasks assessed/performed;Anxious Overall Cognitive Status: Within Functional Limits for tasks assessed (Reports "not as sharp as I used to be")                      General Comments      Exercises  Assessment/Plan    PT Assessment Patient needs continued PT services  PT Diagnosis Difficulty walking;Generalized weakness   PT Problem List Decreased strength;Decreased activity tolerance;Decreased balance;Decreased mobility;Decreased knowledge of use of DME;Cardiopulmonary status limiting activity  PT Treatment Interventions DME  instruction;Gait training;Functional mobility training;Therapeutic activities;Balance training;Patient/family education   PT Goals (Current goals can be found in the Care Plan section) Acute Rehab PT Goals Patient Stated Goal: To go home  PT Goal Formulation: With patient Time For Goal Achievement: 06/21/15 Potential to Achieve Goals: Good    Frequency Min 3X/week   Barriers to discharge Decreased caregiver support Patient has at least 3 daughters providing assistance prn.  Unsure of availability.    Co-evaluation               End of Session Equipment Utilized During Treatment: Gait belt;Oxygen Activity Tolerance: Patient tolerated treatment well;Patient limited by fatigue Patient left: in bed;with call bell/phone within reach;with bed alarm set Nurse Communication: Mobility status (Needs HHPT)    Functional Assessment Tool Used: Clinical judgement Functional Limitation: Mobility: Walking and moving around Mobility: Walking and Moving Around Current Status [id number]): At least 1 percent but less than 20 percent impaired, limited or restricted Mobility: Walking and Moving Around Goal Status [phone]): At least 1 percent but less than 20 percent impaired, limited or restricted Mobility: Walking and Moving Around Discharge Status [phone]): At least 1 percent but less than 20 percent impaired, limited or restricted    Time: 1022-1106  MD visit 10 min mid session PT Time Calculation (min) (ACUTE ONLY): 44 min - 10 min = 34 min total   Charges:   PT Evaluation $PT Eval Moderate Complexity: 1 Procedure PT Treatments $Gait Training: 8-22 mins   PT G Codes:   PT G-Codes **NOT FOR INPATIENT CLASS** Functional Assessment Tool Used: Clinical judgement Functional Limitation: Mobility: Walking and moving around Mobility: Walking and Moving Around Current Status [id number]): At least 1 percent but less than 20 percent impaired, limited or restricted Mobility: Walking and Moving Around Goal  Status [phone]): At least 1 percent but less than 20 percent impaired, limited or restricted Mobility: Walking and Moving Around Discharge Status [phone]): At least 1 percent but less than 20 percent impaired, limited or restricted    Despina Pole 06/14/2015, 1:57 PM Carita Pian. Sanjuana Kava, [place] Pager [phone]

## 2015-06-14 NOTE — Progress Notes (Signed)
Call received per floor RN regarding Pt with acute SOB. Recently admitted S/P falls and COPD exacerbation. Per floor RN Pt 02 sats 88-90 % on 2 LNC with increased WOB. Advised RN to increase oxygen to [facility] while RRT en route. Upon my arrival at 0445 Pt found resting in bed, mild SOB. Lungs congested with slight expiratory wheeze,  diminished bases. RR 20-25, BP 150/62R HR 80s, Po2 92% on 4 LNC. Awake, alert oriented, denies pain but complains of SOB. Respiratory Therapist at bedside as well, Albuterol NEB treatment given. After treatment WOB improved, po2 sats 98% on 4 LNC, reduced to 2 LNC. Pt remains with congestion, advised RN to Page MD and update on Pt status and respiratory assessment. RN to notify myself and Provider for worsening changes.

## 2015-06-14 NOTE — Progress Notes (Signed)
Family Medicine Teaching Service Daily Progress Note Intern Pager: [phone]  Patient name: [name] Medical record number: [id number] Date of birth: 10/20/1923 Age: 82 y.o. Gender: female  Primary Care Provider: MCDIARMID,TODD D, MD Consultants: none Code Status: full  Pt Overview and Major Events to Date:  4/15 - admit for confusion, dyspnea  Assessment and Plan: [name] is a 82 y.o. female presenting with confusion, cough, and SOB. PMH is significant for COPD, AFib, HTN, COPD, CKD Stage III, macular degeneration, sick sinus syndrome w/ pacemaker, and CAD s/p PCI.   # Confusion/acute encephalopathy; resolved: Patient is alert and oriented , no focal deficits. No clear etiology or level of significance as confusion had resolved prior to being seen by any healthcare provider. Likely age-related versus dementia versus dehydration versus medication related. Of note: Temporary hypoxia/hypercarbia may also cause an altered mental status - Anticipating family to have many questions about significance and etiology of these symptoms   # Dyspnea likely 2/2 COPD exacerbation: Patient has new O2 requirement of 4 L in ED. At baseline patient is no longer on supplemental O2 (however patient had been requiring 2 L at home in the past). Sats in high 90's. No evidence of respiratory distress but patient does have new productive cough. Bilateral wheezes on exam. Chest x-ray clear. May be contributing to temporary confusion. Differential also includes pneumonia versus PE versus cardiogenic. - Supplemental O2 PRN; plan to wean off as soon as tolerated  - Doxycycline 100 mg twice a day - Prednisone 50 mg daily - Albuterol inhaler every 2 hours when necessary - scheduled duonebs q6h - Dulera inhaler (substituted for Advair) - Continue home Spiriva - Continue to monitor   # Facial trauma with soft tissue ecchymoses in setting of recent fall on cruise ship: Patient on chronic Eliquis due to history of  A. fib and sick sinus syndrome. Likely a major contributing factor to the extensive nature of her ecchymotic status. CT head/C-spine/maxillofacial performed in ED were able to effectively rule out fractures, displacement, or resolving intracranial bleeds secondary to this trauma.  - Monitor for pain and discomfort - Daily CBCs to ensure homeostasis. - Continue Eliquis - Consult wound care for laceration on lower extremity - PT/OT consults  # H/o AFib and Sick Sinus Syndrome: Pacemaker in place; paced rhythm. No evidence of Afib in ED. On long term anticoagulation for Afib in the past. Recently changed to Eliquis. Consider episode of atrial fibrillation as a possible cause of fall and cruciate. - continue amiodarone 200 mg daily - resume metoprolol at lower dose - Cycle troponins - neg x3 - BNP elevated to 744 without signs of volume overload on exam - continue home Eliquis  # CAD s/p PCI with h/o MI - continue atorvastatin 20 mg daily   # CKD Stage III: Solitary kidney. Patient has h/o transitional cell cancer. Baseline Cr 1.3-1.5. Cr 1.59 this AM - BMET in AM, Monitor Cr   # H/o HTN: BP improving - Continue to monitor  - Resume Metoprololat lower dose  # Anxiety: Stable.  - continue home Ativan 0.5 mg bid prn   # Osteoarthritis:  - continue Tramadol 50 mg q6h prn   FEN/GI: heart healthy; SLIV Prophylaxis: Eliquis  Disposition: pending PT/OT evals and improvement in oxygenation  Subjective:  Feeling well.  Breathing continues to be difficult.  An episode of breathing difficulty with rapid response called overnight - improved s/p neb.  Objective: Temp:  [98.4 F (36.9 C)-100 F (37.8  C)] 98.4 F (36.9 C) (04/16 0515) Pulse Rate:  [52-96] 80 (04/16 0515) Resp:  [15-32] 20 (04/16 0515) BP: (90-150)/(43-86) 150/62 mmHg (04/16 0515) SpO2:  [89 %-100 %] 100 % (04/16 0515) Weight:  [165 lb 14.4 oz (75.252 kg)] 165 lb 14.4 oz (75.252 kg) (04/16 0336) Physical  Exam: General -- oriented x3, pleasant and cooperative. Elderly HEENT -- Head is normocephalic. PERRLA. EOMI. Subconjunctival hemorrhage of left eye. Substantial ecchymosis throughout the forehead, face, and neck with some sparing around the chin. Neck -- supple; no bruits. No JVD, no LAD Chest -- good expansion. Wheezes noted bilaterally. Productive cough noted.  Cardiac -- RRR. No murmurs noted Abdomen -- soft, nontender. No masses palpable. Bowel sounds present. CNS -- cranial nerves II through XII grossly intact. 2+ reflexes bilaterally. Extremeties - left skin tear/laceration noted on right lower extremity just inferior to the tibial tuberosity (~5x8cm area). No effusions noted. ROM good. 5/5 bilateral strength. Dorsalis pedis pulses present and symmetrical.   Laboratory:  Recent Labs Lab 06/13/15 1903 06/14/15 0431  WBC 8.8 7.6  HGB 10.4* 10.3*  HCT 33.0* 33.0*  PLT 194 190    Recent Labs Lab 06/13/15 1903 06/14/15 0431  NA 134* 138  K 4.2 3.9  CL 97* 98*  CO2 24 29  BUN 24* 21*  CREATININE 1.49* 1.59*  CALCIUM 8.7* 8.7*  PROT 6.6  --   BILITOT 1.2  --   ALKPHOS 54  --   ALT 15  --   AST 21  --   GLUCOSE 128* 107*    Imaging/Diagnostic Tests: Dg Chest 2 View  06/13/2015  CLINICAL DATA:  Patient with COPD an pneumonia. Status post fall. Confusion. EXAM: CHEST  2 VIEW COMPARISON:  Multiple priors dating back to 12/18/2006 FINDINGS: Dual lead pacer apparatus overlies right hemi thorax, leads are stable in position. Stable cardiomegaly. Unchanged focal opacity left mid lung most compatible with scarring. No superimposed area of pulmonary consolidation. Osseous demineralization. Thoracic spine degenerative changes. IMPRESSION: No acute cardiopulmonary process.  Cardiomegaly. Electronically Signed   By: Lovey Newcomer M.D.   On: 06/13/2015 21:29   Ct Head Wo Contrast  06/13/2015  CLINICAL DATA:  Recent fall on cruise ship 4 days prior with facial and neck ecchymosis.  Anticoagulated on Eliquis. Altered mental status. EXAM: CT HEAD WITHOUT CONTRAST CT MAXILLOFACIAL WITHOUT CONTRAST CT CERVICAL SPINE WITHOUT CONTRAST TECHNIQUE: Multidetector CT imaging of the head, cervical spine, and maxillofacial structures were performed using the standard protocol without intravenous contrast. Multiplanar CT image reconstructions of the cervical spine and maxillofacial structures were also generated. COMPARISON:  07/30/2014 head CT. FINDINGS: CT HEAD FINDINGS Midline small moderate frontal scalp contusion. No evidence of parenchymal hemorrhage or extra-axial fluid collection. No mass lesion, mass effect, or midline shift. No CT evidence of acute infarction. Intracranial atherosclerosis. Nonspecific stable mild subcortical and periventricular white matter hypodensity, most in keeping with chronic small vessel ischemic change. Generalized cerebral volume loss. No ventriculomegaly. The visualized paranasal sinuses are essentially clear. The mastoid air cells are unopacified. No evidence of calvarial fracture. CT MAXILLOFACIAL FINDINGS Small to moderate midline frontal scalp contusion. No maxillofacial fracture. The maxilla and mandible appear intact. The nasal bones are unremarkable in appearance. No dislocation at the temporomandibular joints. No residual maxillary teeth. The visualized mandibular dentition demonstrates no acute abnormality. The orbits are intact bilaterally. The visualized paranasal sinuses are essentially clear. The visualized mastoid air cells are unopacified. No aggressive appearing focal osseous lesions. The parapharyngeal fat planes are  preserved. The nasopharynx, oropharynx and hypopharynx are unremarkable in appearance. The parotid and submandibular glands are within normal limits. No cervical lymphadenopathy is seen. CT CERVICAL SPINE FINDINGS No fracture is detected in the cervical spine. No prevertebral soft tissue swelling. Normal cervical lordosis. Dens is well  positioned between the lateral masses of C1. The lateral masses appear well-aligned. Moderate degenerative disc disease in the mid to lower cervical spine. Mild bilateral facet arthropathy. Mild foraminal stenosis on the left at C5-6 No cervical spine subluxation. Visualized mastoid air cells appear clear. No evidence of intra-axial hemorrhage in the visualized brain. No gross cervical canal hematoma. No significant pulmonary nodules at the visualized lung apices. No cervical adenopathy or other significant neck soft tissue abnormality. IMPRESSION: 1. Small to moderate midline frontal scalp contusion. No evidence of acute intracranial abnormality. No calvarial fracture. 2. Generalized cerebral volume loss and mild chronic small vessel ischemia. 3. No maxillofacial fracture. 4. No fracture subluxation in the cervical spine. Moderate degenerative changes in the cervical spine. Electronically Signed   By: Ilona Sorrel M.D.   On: 06/13/2015 21:08   Ct Cervical Spine Wo Contrast  06/13/2015  CLINICAL DATA:  Recent fall on cruise ship 4 days prior with facial and neck ecchymosis. Anticoagulated on Eliquis. Altered mental status. EXAM: CT HEAD WITHOUT CONTRAST CT MAXILLOFACIAL WITHOUT CONTRAST CT CERVICAL SPINE WITHOUT CONTRAST TECHNIQUE: Multidetector CT imaging of the head, cervical spine, and maxillofacial structures were performed using the standard protocol without intravenous contrast. Multiplanar CT image reconstructions of the cervical spine and maxillofacial structures were also generated. COMPARISON:  07/30/2014 head CT. FINDINGS: CT HEAD FINDINGS Midline small moderate frontal scalp contusion. No evidence of parenchymal hemorrhage or extra-axial fluid collection. No mass lesion, mass effect, or midline shift. No CT evidence of acute infarction. Intracranial atherosclerosis. Nonspecific stable mild subcortical and periventricular white matter hypodensity, most in keeping with chronic small vessel ischemic  change. Generalized cerebral volume loss. No ventriculomegaly. The visualized paranasal sinuses are essentially clear. The mastoid air cells are unopacified. No evidence of calvarial fracture. CT MAXILLOFACIAL FINDINGS Small to moderate midline frontal scalp contusion. No maxillofacial fracture. The maxilla and mandible appear intact. The nasal bones are unremarkable in appearance. No dislocation at the temporomandibular joints. No residual maxillary teeth. The visualized mandibular dentition demonstrates no acute abnormality. The orbits are intact bilaterally. The visualized paranasal sinuses are essentially clear. The visualized mastoid air cells are unopacified. No aggressive appearing focal osseous lesions. The parapharyngeal fat planes are preserved. The nasopharynx, oropharynx and hypopharynx are unremarkable in appearance. The parotid and submandibular glands are within normal limits. No cervical lymphadenopathy is seen. CT CERVICAL SPINE FINDINGS No fracture is detected in the cervical spine. No prevertebral soft tissue swelling. Normal cervical lordosis. Dens is well positioned between the lateral masses of C1. The lateral masses appear well-aligned. Moderate degenerative disc disease in the mid to lower cervical spine. Mild bilateral facet arthropathy. Mild foraminal stenosis on the left at C5-6 No cervical spine subluxation. Visualized mastoid air cells appear clear. No evidence of intra-axial hemorrhage in the visualized brain. No gross cervical canal hematoma. No significant pulmonary nodules at the visualized lung apices. No cervical adenopathy or other significant neck soft tissue abnormality. IMPRESSION: 1. Small to moderate midline frontal scalp contusion. No evidence of acute intracranial abnormality. No calvarial fracture. 2. Generalized cerebral volume loss and mild chronic small vessel ischemia. 3. No maxillofacial fracture. 4. No fracture subluxation in the cervical spine. Moderate degenerative  changes in the cervical spine.  Electronically Signed   By: Ilona Sorrel M.D.   On: 06/13/2015 21:08   Dg Chest Port 1 View  06/14/2015  CLINICAL DATA:  Dyspnea. Some improvement after breathing treatment. EXAM: PORTABLE CHEST 1 VIEW COMPARISON:  06/13/2015 FINDINGS: There is hyperinflation and marked cardiomegaly. This is unchanged. There are intact appearances of the transvenous cardiac leads. Linear and patchy opacities persist without significant interval change. No confluent consolidation. No large effusion. Normal pulmonary vasculature. No interval change from 06/13/2015 or 05/20/2015. IMPRESSION: Unchanged cardiomegaly and hyperinflation. Linear and patchy opacities are likely chronic, unchanged. No superimposed acute findings. Electronically Signed   By: Andreas Newport M.D.   On: 06/14/2015 06:19   Ct Maxillofacial Wo Cm  06/13/2015  CLINICAL DATA:  Recent fall on cruise ship 4 days prior with facial and neck ecchymosis. Anticoagulated on Eliquis. Altered mental status. EXAM: CT HEAD WITHOUT CONTRAST CT MAXILLOFACIAL WITHOUT CONTRAST CT CERVICAL SPINE WITHOUT CONTRAST TECHNIQUE: Multidetector CT imaging of the head, cervical spine, and maxillofacial structures were performed using the standard protocol without intravenous contrast. Multiplanar CT image reconstructions of the cervical spine and maxillofacial structures were also generated. COMPARISON:  07/30/2014 head CT. FINDINGS: CT HEAD FINDINGS Midline small moderate frontal scalp contusion. No evidence of parenchymal hemorrhage or extra-axial fluid collection. No mass lesion, mass effect, or midline shift. No CT evidence of acute infarction. Intracranial atherosclerosis. Nonspecific stable mild subcortical and periventricular white matter hypodensity, most in keeping with chronic small vessel ischemic change. Generalized cerebral volume loss. No ventriculomegaly. The visualized paranasal sinuses are essentially clear. The mastoid air cells are  unopacified. No evidence of calvarial fracture. CT MAXILLOFACIAL FINDINGS Small to moderate midline frontal scalp contusion. No maxillofacial fracture. The maxilla and mandible appear intact. The nasal bones are unremarkable in appearance. No dislocation at the temporomandibular joints. No residual maxillary teeth. The visualized mandibular dentition demonstrates no acute abnormality. The orbits are intact bilaterally. The visualized paranasal sinuses are essentially clear. The visualized mastoid air cells are unopacified. No aggressive appearing focal osseous lesions. The parapharyngeal fat planes are preserved. The nasopharynx, oropharynx and hypopharynx are unremarkable in appearance. The parotid and submandibular glands are within normal limits. No cervical lymphadenopathy is seen. CT CERVICAL SPINE FINDINGS No fracture is detected in the cervical spine. No prevertebral soft tissue swelling. Normal cervical lordosis. Dens is well positioned between the lateral masses of C1. The lateral masses appear well-aligned. Moderate degenerative disc disease in the mid to lower cervical spine. Mild bilateral facet arthropathy. Mild foraminal stenosis on the left at C5-6 No cervical spine subluxation. Visualized mastoid air cells appear clear. No evidence of intra-axial hemorrhage in the visualized brain. No gross cervical canal hematoma. No significant pulmonary nodules at the visualized lung apices. No cervical adenopathy or other significant neck soft tissue abnormality. IMPRESSION: 1. Small to moderate midline frontal scalp contusion. No evidence of acute intracranial abnormality. No calvarial fracture. 2. Generalized cerebral volume loss and mild chronic small vessel ischemia. 3. No maxillofacial fracture. 4. No fracture subluxation in the cervical spine. Moderate degenerative changes in the cervical spine. Electronically Signed   By: Ilona Sorrel M.D.   On: 06/13/2015 21:08    Virginia Crews, MD 06/14/2015,  9:29 AM PGY-2, [place] Intern pager: [phone], text pages welcome

## 2015-06-14 NOTE — Progress Notes (Signed)
Patient arrived to room 3E19 from [facility] Admitted with SOB/recent falls. SR on telemetry. VS stable. Patient alert and oriented X 4. Patient has multiple bruises to face, neck arms, and legs. Right leg has a skin tear/ wound encompassing most of the knee from a fall on Tuesday. The wound has steri strips but is open to air. Wound care consult initiated. Patient oriented to room and call bell.

## 2015-06-15 ENCOUNTER — Telehealth: Payer: Self-pay | Admitting: Emergency Medicine

## 2015-06-15 DIAGNOSIS — I480 Paroxysmal atrial fibrillation: Secondary | ICD-10-CM

## 2015-06-15 DIAGNOSIS — R0600 Dyspnea, unspecified: Secondary | ICD-10-CM

## 2015-06-15 DIAGNOSIS — J441 Chronic obstructive pulmonary disease with (acute) exacerbation: Secondary | ICD-10-CM | POA: Diagnosis not present

## 2015-06-15 DIAGNOSIS — L03115 Cellulitis of right lower limb: Secondary | ICD-10-CM | POA: Insufficient documentation

## 2015-06-15 DIAGNOSIS — Z7901 Long term (current) use of anticoagulants: Secondary | ICD-10-CM

## 2015-06-15 LAB — URINE CULTURE

## 2015-06-15 MED ORDER — POLYETHYLENE GLYCOL 3350 17 G PO PACK: 17.0000 g | PACK | Freq: Two times a day (BID) | ORAL | Status: DC

## 2015-06-15 MED ORDER — BACITRACIN ZINC 500 UNIT/GM EX OINT
TOPICAL_OINTMENT | Freq: Every day | CUTANEOUS | Status: DC
  Filled 2015-06-15: qty 28.35

## 2015-06-15 MED ORDER — BISACODYL 5 MG PO TBEC
10.0000 mg | DELAYED_RELEASE_TABLET | Freq: Once | ORAL | Status: AC
  Administered 2015-06-15: 10 mg via ORAL
  Filled 2015-06-15: qty 2

## 2015-06-15 MED ORDER — PREDNISONE 50 MG PO TABS: 50.0000 mg | ORAL_TABLET | Freq: Every day | ORAL | Status: DC

## 2015-06-15 MED ORDER — DOXYCYCLINE MONOHYDRATE 100 MG PO CAPS: 100.0000 mg | ORAL_CAPSULE | Freq: Two times a day (BID) | ORAL | Status: DC

## 2015-06-15 MED ORDER — BACITRACIN ZINC 500 UNIT/GM EX OINT: TOPICAL_OINTMENT | Freq: Every day | CUTANEOUS | Status: DC

## 2015-06-15 MED ORDER — METOPROLOL SUCCINATE ER 25 MG PO TB24
25.0000 mg | ORAL_TABLET | Freq: Every day | ORAL | Status: DC
  Administered 2015-06-15: 25 mg via ORAL
  Filled 2015-06-15: qty 1

## 2015-06-15 MED ORDER — POLYETHYLENE GLYCOL 3350 17 G PO PACK
17.0000 g | PACK | Freq: Two times a day (BID) | ORAL | Status: DC
  Administered 2015-06-15: 17 g via ORAL
  Filled 2015-06-15: qty 1

## 2015-06-15 MED ORDER — MUPIROCIN CALCIUM 2 % EX CREA
TOPICAL_CREAM | Freq: Every day | CUTANEOUS | Status: DC
  Administered 2015-06-15: 11:00:00 via TOPICAL
  Filled 2015-06-15: qty 15

## 2015-06-15 NOTE — Telephone Encounter (Signed)
Called spoke with patient's daughter who asked if RB was aware that pt had fallen while on a cruise on 4.12.17 and was hospitalized after returning on 4.15.17 - she was being treated by the cruise physician until they returned and pt did well until the end of the trip when family began to notice that she was "talking out of her head" and they took her to the ED.  [name] also reported that they cruise physician was concerned that pt's Eliquis dose may be too high and pt is scheduled to see cardiology 4.19.17.  Pt is being discharged today on O2.  Tobias Alexander that there is no mention that RB or CCM was notified and [name] requests this be forwarded to [place] as FYI - no call back needed.  Will sign and forward to RB

## 2015-06-15 NOTE — Progress Notes (Signed)
Family Medicine Teaching Service Daily Progress Note Intern Pager: [phone]  Patient name: [name] Medical record number: [id number] Date of birth: 1923/07/01 Age: 82 y.o. Gender: female  Primary Care Provider: MCDIARMID,TODD D, MD Consultants: none Code Status: full  Pt Overview and Major Events to Date:  4/15 - admit for confusion, dyspnea  Assessment and Plan: STEPHANEY DICOSTANZO is a 82 y.o. female presenting with confusion, cough, and SOB. PMH is significant for COPD, AFib, HTN, COPD, CKD Stage III, macular degeneration, sick sinus syndrome w/ pacemaker, and CAD s/p PCI.   # Confusion/acute encephalopathy; resolved: Patient is alert and oriented , no focal deficits. No clear etiology or level of significance as confusion had resolved prior to being seen by any healthcare provider. Likely age-related versus dementia versus dehydration versus medication related. Of note: Temporary hypoxia/hypercarbia may also cause an altered mental status - Anticipating family to have many questions about significance and etiology of these symptoms   Dyspnea likely 2/2 COPD exacerbation: Patient has new O2 requirement of 4 L in ED. At baseline patient is no longer on supplemental O2 (however patient had been requiring 2 L at home in the past). Sats in high 90's. No evidence of respiratory distress but patient does have new productive cough. Bilateral wheezes on exam. Chest x-ray clear. May be contributing to temporary confusion. Differential also includes pneumonia versus PE versus cardiogenic. - Supplemental O2 PRN; plan to wean off as soon as tolerated  - Doxycycline 100 mg twice a day - Prednisone 50 mg daily - Albuterol inhaler every 2 hours when necessary - scheduled duonebs q6h - Dulera inhaler (substituted for Advair) - Continue home Spiriva - Ambulate for oxygen requirment - PT  Facial trauma with soft tissue ecchymoses in setting of recent fall on cruise ship: Patient on chronic Eliquis due to  history of A. fib and sick sinus syndrome.  CT head/C-spine/maxillofacial w/o fractures, displacement, or resolving intracranial bleeds. - Monitor for pain and discomfort - Daily CBCs to ensure homeostasis. - Continue Eliquis - Consult wound care for laceration on lower extremity - PT/OT consults  H/o AFib and Sick Sinus Syndrome: Pacemaker in place; paced rhythm. No evidence of Afib in ED. On long term anticoagulation for Afib in the past. Recently changed to Eliquis. Consider episode of atrial fibrillation as a possible cause of fall althuogh she reports stumbling. CHAD-Vasc score 5, (7.2% risk of stroke). HAS-BLED 4 (8.9% risk of bleeding). Eliqius decrease her risk of stroke by 60%. After discussion with PCP, we decided to continue Eliquis.  - continue amiodarone 200 mg daily - resumed metoprolol XL at half home dose. Also on tartrate 25 mg as needed at home. - BNP elevated to 744 without signs of volume overload on exam - continue home Eliquis  CAD s/p PCI with h/o MI: on two metoprolol at home - continue atorvastatin 20 mg daily  - Resumed metoprolol succinate at half of home dose  CKD Stage III: Solitary kidney. Cr 1.59. Baseline 1.3-1.5. Patient has h/o transitional cell cancer. UA wi/o hematuria.  H/o HTN: BP stable - Continue to monitor   Right Knee laceration: bruised purple area below the surface of the skin to right anterior knee; 6X8cm surrounding wound. Appears clean. No sign of infection. -appreciate [place] recs:  -Bactroban to promote moist healing. Foam dressing to protect from further injury  Constipation: Last BM four days ago.  -Miralax & dulcolax  Anxiety: Stable.  - continue home Ativan 0.5 mg bid prn  Osteoarthritis:  - continue Tramadol 50 mg q6h prn   FEN/GI: heart healthy; SLIV  Prophylaxis: Eliquis  Disposition: discharge home pending PT evals and ambulation for oxygen requirement.   Subjective:  Feeling well.  Breathing improved. Denies chest  pain or belly pain. Talks in full sentence. She didn't have BM in four days now.  Objective: Temp:  [98.2 F (36.8 C)-98.4 F (36.9 C)] 98.4 F (36.9 C) (04/16 1230) Pulse Rate:  [60-65] 60 (04/16 1230) Resp:  [18] 18 (04/16 1230) BP: (117)/(52-53) 117/52 mmHg (04/16 1230) SpO2:  [95 %-97 %] 95 % (04/17 0124) Physical Exam: General - alert, oriented x4 but not date, pleasant and cooperative. Elderly HEENT - Head is normocephalic. PERRLA. EOMI. Subconjunctival hemorrhage of left eye. Substantial ecchymosis throughout the forehead, face, and neck with some sparing around the chin. Chest -Wheezes noted bilaterally. No crackles Cardiac - RRR. No murmurs noted, no edema Abdomen -soft, nontender. No masses palpable. Bowel sounds present. CNS -cranial nerves grossly intact Extremeties - bruised purple area below the surface of the skin to right anterior knee; 6X8cm surrounding wound. Appears clean. No sign of infection. No edema  Laboratory:  Recent Labs Lab 06/13/15 1903 06/14/15 0431  WBC 8.8 7.6  HGB 10.4* 10.3*  HCT 33.0* 33.0*  PLT 194 190    Recent Labs Lab 06/13/15 1903 06/14/15 0431  NA 134* 138  K 4.2 3.9  CL 97* 98*  CO2 24 29  BUN 24* 21*  CREATININE 1.49* 1.59*  CALCIUM 8.7* 8.7*  PROT 6.6  --   BILITOT 1.2  --   ALKPHOS 54  --   ALT 15  --   AST 21  --   GLUCOSE 128* 107*    Imaging/Diagnostic Tests: No results found.  Mercy Riding, MD 06/15/2015, 8:06 AM PGY-1, [place] Intern pager: [phone], text pages welcome

## 2015-06-15 NOTE — Care Management Note (Addendum)
Case Management Note  Patient Details  Name: [name] MRN: [id number] Date of Birth: 30-Jun-1923  Subjective/Objective:        Adm w dypsnea            Action/Plan: lives w da, pcp dr mcdaimid   Expected Discharge Date:                  Expected Discharge Plan:  [place]  In-House Referral:     Discharge planning Services  CM Consult  Post Acute Care Choice:  Home Health Choice offered to:  Patient  DME Arranged:    DME Agency:     HH Arranged:  PT, OT HH Agency:  [place]  Status of Service:  Completed, signed off  Medicare Important Message Given:    Date Medicare IM Given:    Medicare IM give by:    Date Additional Medicare IM Given:    Additional Medicare Important Message give by:     If discussed at [place] of Stay Meetings, dates discussed:    Additional Comments:alerted ahc of need for hhpt and hhot, added hhrn w knee wound, phy there called and desat w ambul, md placed home oxygen order. Have spoken w jermaine w ahc and home o2 to be set up. [place] o2 to be del to room. ahc aware pt for dc home today.  Lacretia Leigh, RN 06/15/2015, 1:43 PM

## 2015-06-15 NOTE — Evaluation (Signed)
Occupational Therapy Evaluation and Discharge Patient Details Name: [name] MRN: [id number] DOB: [birth year] Today's Date: 06/15/2015    History of Present Illness Patient is an 82 yo female admitted 06/13/15 with dyspnea - COPD exacerbation.  Patient with recent fall on cruise ship - bruising, Rt knee wound.  Patient does not remember most of this incident.  PMH:  COPD, Afib, HTN, CKD, macular degeneration, pacemaker, CAD, MI, OA   Clinical Impression   This 82 yo female admitted with above presents to acute OT with all acute OT education completed and plans on being with 1 of her daughters at all times. Will D/C from acute care with follow up [place] recommended.    Follow Up Recommendations  Home health OT;Other (comment) (Supervision when up on her feet)    Equipment Recommendations  None recommended by OT       Precautions / Restrictions Precautions Precautions: Fall Precaution Comments: Prior falls Restrictions Weight Bearing Restrictions: No      Mobility Bed Mobility               General bed mobility comments: Pt up in recliner upon my arrival  Transfers Overall transfer level: Needs assistance Equipment used: Rolling walker (2 wheeled) Transfers: Sit to/from Stand Sit to Stand: Supervision         General transfer comment: Patient using correct hand placement.      Balance Overall balance assessment: Needs assistance Sitting-balance support: No upper extremity supported;Feet supported Sitting balance-Leahy Scale: Good     Standing balance support: Bilateral upper extremity supported Standing balance-Leahy Scale: Poor Standing balance comment: intermittently reliant on RW                            ADL Overall ADL's : Needs assistance/impaired Eating/Feeding: Independent;Sitting   Grooming: Set up;Supervision/safety;Standing;Wash/dry hands;Wash/dry face;Oral care;Brushing hair   Upper Body Bathing: Set up;Sitting   Lower Body  Bathing: Supervison/ safety;Set up;Sit to/from stand   Upper Body Dressing : Set up;Sitting   Lower Body Dressing: Set up;Supervision/safety;Sit to/from stand   Toilet Transfer: Supervision/safety;Ambulation;RW;Comfort height toilet;Grab bars   Toileting- Clothing Manipulation and Hygiene: Supervision/safety;Sit to/from stand                         Pertinent Vitals/Pain Pain Assessment: No/denies pain     Hand Dominance Right   Extremity/Trunk Assessment Upper Extremity Assessment Upper Extremity Assessment: Overall WFL for tasks assessed           Communication Communication Communication: No difficulties   Cognition Arousal/Alertness: Awake/alert Behavior During Therapy: WFL for tasks assessed/performed Overall Cognitive Status: Within Functional Limits for tasks assessed                                Home Living Family/patient expects to be discharged to:: Private residence Living Arrangements: Children (Daughter, plans on staying with other daugter when the one that lives with her is out of town (flight attendent)) Available Help at Discharge: Family;Available 24 hours/day Type of Home: House Home Access: Stairs to enter [employer] of Steps: 3 Entrance Stairs-Rails: Right;Left Home Layout: Two level;Full bath on main level;Bed/bath upstairs Alternate Level Stairs-Number of Steps: flight Alternate Level Stairs-Rails: Right Bathroom Shower/Tub: Tub/shower unit;Curtain Shower/tub characteristics: Curtain Bathroom Toilet: Handicapped height     Home Equipment: Environmental consultant - 4 wheels;Shower seat;Bedside commode   Additional Comments: patient  has 4 daughters and a friend from church that can help. going to stay at daughter house ( daughters husband passed this year with ALS so home is accessible) patient can stay on main floor . pt drives and does community shopping at baseline. pt does have 3 steps to get into bed due to bed height at  daughters house      Prior Functioning/Environment Level of Independence: Independent with assistive device(s)        Comments: Patient reports she uses her RW for longer distances.  Patient drives.Patient cooks    OT Diagnosis: Generalized weakness         OT Goals(Current goals can be found in the care plan section) Acute Rehab OT Goals Patient Stated Goal: To go home today hopefully  OT Frequency:                End of Session Equipment Utilized During Treatment: Rolling walker Nurse Communication:  (Pt currently 91% on RA after being up and moving about in room. RN Ok'd to leave O2 off for now and I made her aware that I would contact PT to check O2 sats with ambulation)  Activity Tolerance: Patient tolerated treatment well Patient left: in chair;with call bell/phone within reach;with chair alarm set   Time: [id number] OT Time Calculation (min): 35 min Charges:  OT General Charges $OT Visit: 1 Procedure OT Evaluation $OT Eval Moderate Complexity: 1 Procedure OT Treatments $Self Care/Home Management : 8-22 mins  Almon Register [id number] 06/15/2015, 11:24 AM

## 2015-06-15 NOTE — Progress Notes (Signed)
Pt has orders to be discharged. Discharge instructions given and pt has no additional questions at this time. Medication regimen reviewed and pt educated. Pt verbalized understanding and has no additional questions. Telemetry box removed. IV removed and site in good condition. Pt stable and waiting for transportation.   Tyera Hansley RN 

## 2015-06-15 NOTE — Consult Note (Signed)
WOC wound consult note Reason for Consult: Consult requested for right knee wound.  Pt fell earlier this week prior to admission. Wound type: Full thickness traumatic injury to right knee Wound bed: Full thickness wound in a  C shape; 8X1cm with dry red-black woundbed which is old clotted blood Drainage (amount, consistency, odor) No odor, drainage, or fluctuance when probed Periwound: Bruised purple area below the surface of the skin to right anterior knee; 6X8cm surrounding wound Dressing procedure/placement/frequency: Bactroban to promote moist healing. Foam dressing to protect from further injury.  Discussed plan of care with patient, no family members present.  She denies further questions at this time. Please re-consult if further assistance is needed.  Thank-you,  Julien Girt MSN, [place], [place], [place], [place]

## 2015-06-15 NOTE — Progress Notes (Signed)
SATURATION QUALIFICATIONS: (This note is used to comply with regulatory documentation for home oxygen)  Patient Saturations on Room Air at Rest = 89%  Patient Saturations on Room Air while Ambulating = 82%  Patient Saturations on 2 Liters of oxygen while Ambulating =92%  Pt desaturates with activity on room air, needs 2L [place] O2 to keep SaO2 greater than 90% with activity. Gave pt incentive spirometer and educated on use. Pt compliant

## 2015-06-15 NOTE — Consult Note (Signed)
   [facility] CM Inpatient Consult   06/15/2015  [name] 03/12/1923 [id number] Referral received to assess for care management services for COPD exacerbation.    Met with the patient regarding the benefits of [facility] Management services.  Explained that [facility] Management is a covered benefit of Medicare insurance. Patient endorses Dr. Sherren Mocha McDiarmid as her primary care provider. Review information for [facility] and a folder was provided with contact information.  Explained that [place] Management does not interfere with or replace any services arranged by the inpatient care management staff.  Patient declined services with [facility] Management stating she feels like she has good medical follow up and she still drives and manages her medications.  Patient did given a brochure and contact information.  For questions, please contact: Natividad Brood, RN BSN [facility] Liaison  [phone] business mobile phone Toll free office [phone]

## 2015-06-15 NOTE — Care Management Obs Status (Signed)
[place] NOTIFICATION   Patient Details  Name: [name] MRN: [id number] Date of Birth: Oct 10, 1923   Medicare Observation Status Notification Given:  Yes    Lacretia Leigh, RN 06/15/2015, 1:42 PM

## 2015-06-15 NOTE — Discharge Summary (Signed)
[facility] Discharge Summary  Patient name: [name] Medical record number: [id number] Date of birth: Aug 14, 1923 Age: 82 y.o. Gender: female Date of Admission: 06/13/2015  Date of Discharge: 06/15/2015  Admitting Physician: Zenia Resides, MD  Primary Care Provider: MCDIARMID,TODD D, MD Consultants: none  Indication for Hospitalization: shortness of breath and confusion  Discharge Diagnoses/Problem List:  Patient Active Problem List   Diagnosis Date Noted  . Paroxysmal atrial fibrillation (HCC)   . COPD   . Diastolic dysfunction   . History of iron deficiency 01/16/2015  . Pure hypercholesterolemia 07/31/2014  . CAD S/P RCA BMS, residual CFX disease 06/08/2014  . Pacemaker 04/02/2013  . Mild cognitive impairment 10/26/2012  . Transitional cell carcinoma of ureter, history   . Vitamin D deficiency 11/02/2010  . Macular degeneration, bilateral 10/04/2010    Class: Chronic  . Solitary kidney, acquired 05/17/2010  . Spondylolisthesis of lumbar region   . VITAMIN B12 DEFICIENCY 10/07/2009  . Chronic kidney disease (CKD), stage IV (severe) ([place]) 09/02/2009  . MYOCARDIAL INFARCTION, HX OF 09/25/2008  . VENTRICULAR HYPERTROPHY, LEFT 08/28/2008  . At risk for diabetes mellitus 09/11/2007  . ANXIETY 04/27/2006  . SICK SINUS SYNDROME 04/27/2006  . COPD, severe ([place]) 04/27/2006  . GASTROESOPHAGEAL REFLUX, NO ESOPHAGITIS 04/27/2006  . Osteoarthrosis involving lower leg 04/27/2006  . OSTEOARTHRITIS OF SPINE, NOS 04/27/2006  . LUMBAR SPINAL STENOSIS 04/27/2006     Disposition: home  Discharge Condition: stable  Discharge Exam:  General - alert, oriented x4 but not date, pleasant and cooperative. Elderly HEENT - Head is normocephalic. PERRLA. EOMI. Subconjunctival hemorrhage of left eye. Substantial ecchymosis throughout the forehead, face, and neck with some sparing around the chin. (see picture below) Chest -Wheezes noted bilaterally. No  crackles Cardiac - RRR. No murmurs noted, no edema Abdomen -soft, nontender. No masses palpable. Bowel sounds present. CNS -cranial nerves grossly intact Extremeties - bruised purple area below the surface of the skin to right anterior knee; 6X8cm surrounding wound. Appears clean. No sign of infection. No edema. See picture below   Brief Hospital Course:  [name] is a 82 y.o. female presenting with cough, shortness of breath, and confusion.   Dyspnea and cough secondary to COPD exacerbation: improved. patient presented with gradually worsening dyspnea and cough since returning from the cruise ship this week. She was without fevers, chills, nausea, vomiting or diarrhea, chest pain, dysuria.  Vital signs within normal range. Oxygen saturation in upper 80's to lower 90's in ED, so started on oxygen 4L. Patient not on oxygen at baseline although she had required 2 L at home in the past. Sats in high 90's. No evidence of respiratory distress but exam with bilateral wheezes. Chest x-ray clear. CBC significant for Hgb of 10.4. CMP with creatinine of 1.5 (CKD). Uranalysis negative.  She was started on steroid, doxycycline, duonebs and her home breathing treatments. Her breathing improved and she was able to come of oxygen.  Prior to discharge, oxygen saturation 89-94% at rest on room air, 82-90% with ambulation on room air, 90-95% with ambulation of 2L oxygen. So, she was discharge on 2L oxygen.   AMS: resolved. patient presented with episodes of confusion witnessed by her daughters. The etiology to this was unclear. She had no sign and symptoms of infection, CT head, face and cervical spine negative.   Fall: Patient with history of fall about two weeks ago while on cruise. She had extensive ecchymoses on her head face and neck and  right knee laceration (see image under media tab). No loss of consciousness. She was evaluated for wound care who recommended bacitracin ointment with gauze wrapping for the  laceration. Home PT and OT ordered up on discharge as recommended.   Paroxysmal Atrial fibrillation/SSS: on Eliquis, amiodarone and metoprolol XL at home. CHAD-Vasc score 5, (7.2% risk of stroke). HAS-BLED 4 (8.9% risk of bleeding). Discussed about this risk with PCP and decided to continue Eliquis.   Other chronic conditions stable.  Issues for Follow Up:  1. COPD exacerbation: discharge on prednisone, doxy and home oxygen in addition to her home. Assess resp status at follow up 2. Fall: recommend fall risk assessment in patient on anticoagulant.  3. AMS: resolved at time of discharge. Assess mental status. Recommend minicog as well. 4. PAF/SSS: patient has an appointment with cardiology. Follow up with recs.  Significant Procedures: none  Significant Labs and Imaging:   Recent Labs Lab 06/13/15 1903 06/14/15 0431  WBC 8.8 7.6  HGB 10.4* 10.3*  HCT 33.0* 33.0*  PLT 194 190    Recent Labs Lab 06/13/15 1903 06/14/15 0431  NA 134* 138  K 4.2 3.9  CL 97* 98*  CO2 24 29  GLUCOSE 128* 107*  BUN 24* 21*  CREATININE 1.49* 1.59*  CALCIUM 8.7* 8.7*  ALKPHOS 54  --   AST 21  --   ALT 15  --   ALBUMIN 3.4*  --     Results/Tests Pending at Time of Discharge: none  Discharge Medications:    Medication List    STOP taking these medications        aspirin 81 MG chewable tablet     cephALEXin 500 MG capsule  Commonly known as:  KEFLEX     HYDROcodone-acetaminophen 5-325 MG tablet  Commonly known as:  NORCO/VICODIN     metoprolol tartrate 25 MG tablet  Commonly known as:  LOPRESSOR      TAKE these medications        albuterol 108 (90 Base) MCG/ACT inhaler  Commonly known as:  VENTOLIN HFA  Inhale 2 puffs into the lungs every 6 (six) hours as needed. If needed for shortness of breath.     amiodarone 200 MG tablet  Commonly known as:  PACERONE  Take 1 tablet (200 mg total) by mouth daily.     apixaban 2.5 MG Tabs tablet  Commonly known as:  ELIQUIS  Take 1  tablet (2.5 mg total) by mouth 2 (two) times daily.     atorvastatin 20 MG tablet  Commonly known as:  LIPITOR  Take 1 tablet (20 mg total) by mouth daily.     bacitracin ointment  Apply topically daily.     cholecalciferol 400 units Tabs tablet  Commonly known as:  VITAMIN D  Take 400 Units by mouth daily.     diclofenac sodium 1 % Gel  Commonly known as:  VOLTAREN  Apply 2 g topically 4 (four) times daily.     docusate sodium 100 MG capsule  Commonly known as:  COLACE  Take 100 mg by mouth daily as needed for mild constipation.     doxycycline 100 MG capsule  Commonly known as:  MONODOX  Take 1 capsule (100 mg total) by mouth 2 (two) times daily. Start this evening and until gone.     Fluticasone-Salmeterol 250-50 MCG/DOSE Aepb  Commonly known as:  ADVAIR DISKUS  Inhale 2 puffs into the lungs 2 (two) times daily.     furosemide 40 MG  tablet  Commonly known as:  LASIX  Take 20 mg by mouth every Monday, Wednesday, and Friday.     LORazepam 0.5 MG tablet  Commonly known as:  ATIVAN  TAKE 1 TABLET EVERY 8 HOURS AS NEEDED FOR ANXIETY OR SHORTNESS OF BREATH.     Melatonin 3 MG Tabs  Take 3 mg by mouth at bedtime as needed (sleep).     metoprolol succinate 50 MG 24 hr tablet  Commonly known as:  TOPROL-XL  Take 50 mg by mouth daily. Take with or immediately following a meal.     nitroGLYCERIN 0.4 MG SL tablet  Commonly known as:  NITROSTAT  Place 0.4 mg under the tongue every 5 (five) minutes as needed for chest pain (x 3 tabs).     omeprazole 20 MG capsule  Commonly known as:  PRILOSEC  Take 1 capsule (20 mg total) by mouth daily.     polyethylene glycol packet  Commonly known as:  MIRALAX / GLYCOLAX  Take 17 g by mouth 2 (two) times daily.     predniSONE 50 MG tablet  Commonly known as:  DELTASONE  Take 1 tablet (50 mg total) by mouth daily with breakfast.     tiotropium 18 MCG inhalation capsule  Commonly known as:  SPIRIVA  Place 1 capsule (18 mcg total)  into inhaler and inhale daily.     traMADol 50 MG tablet  Commonly known as:  ULTRAM  Take 1 tablet (50 mg total) by mouth every 6 (six) hours as needed for moderate pain.        Discharge Instructions: Please refer to Patient Instructions section of EMR for full details.  Patient was counseled important signs and symptoms that should prompt return to medical care, changes in medications, dietary instructions, activity restrictions, and follow up appointments.   Follow-Up Appointments: Follow-up Information    Follow up with Smiley Houseman, MD. Go on 06/18/2015.   Specialty:  Family Medicine   Why:  hospital f/u on COPD and confusion   Contact information:   [place] [phone]       Mercy Riding, MD 06/15/2015, 9:43 PM PGY-1, [employer]

## 2015-06-15 NOTE — Progress Notes (Signed)
Physical Therapy Treatment Patient Details Name: [name] MRN: [id number] DOB: 13-Mar-1923 Today's Date: 06/15/2015    History of Present Illness Patient is an 82 yo female admitted 06/13/15 with dyspnea - COPD exacerbation.  Patient with recent fall on cruise ship - bruising, Rt knee wound.  Patient does not remember most of this incident.  PMH:  COPD, Afib, HTN, CKD, macular degeneration, pacemaker, CAD, MI, OA   PT Comments    Pt admitted with above diagnosis. Pt currently with functional limitations due to decreased strength, cardiorespiratory endurance, and decreased balance. Pt stable with ambulation with use of RW. Pt desats with activity on room air, but maintains sats 90-95% with 2L [place]. Pt states that she does not use O2 at baseline and does not want to have to wear it at home. Provided pt with incentive spirometer and educated on use, pt was compliant. Pt will benefit from skilled PT to increase their independence and safety with mobility to allow discharge to the venue listed below.  Pt will also benefit from HHPT to work towards PLOF and decrease fall risk. Pt states she is going to stay with her other daughter for a little while as the daughter she lives with is a flight attendant and is gone quite frequently.    Follow Up Recommendations  Home health PT;Supervision for mobility/OOB     Equipment Recommendations  None recommended by PT    Recommendations for Other Services       Precautions / Restrictions Precautions Precautions: Fall Precaution Comments: Prior falls Restrictions Weight Bearing Restrictions: No    Mobility  Bed Mobility               General bed mobility comments: Pt up in recliner upon my arrival  Transfers Overall transfer level: Needs assistance Equipment used: Rolling walker (2 wheeled) Transfers: Sit to/from Stand Sit to Stand: Modified independent (Device/Increase time)         General transfer comment: Modified independent with  use of RW.   Ambulation/Gait Ambulation/Gait assistance: Min guard Ambulation Distance (Feet): 100 Feet Assistive device: Rolling walker (2 wheeled) Gait Pattern/deviations: Step-through pattern;Decreased stride length;Trunk flexed Gait velocity: decreased   General Gait Details: Min guard for safety and management of O2 tank. Pt steady with use of RW.    Stairs            Wheelchair Mobility    Modified Rankin (Stroke Patients Only)       Balance Overall balance assessment: Needs assistance Sitting-balance support: No upper extremity supported;Feet supported Sitting balance-Leahy Scale: Good     Standing balance support: Bilateral upper extremity supported Standing balance-Leahy Scale: Poor Standing balance comment: Reliant on UE support.                     Cognition Arousal/Alertness: Awake/alert Behavior During Therapy: WFL for tasks assessed/performed Overall Cognitive Status: Within Functional Limits for tasks assessed                      Exercises      General Comments General comments (skin integrity, edema, etc.): Bruising on entire face from fall.       Pertinent Vitals/Pain Pain Assessment: No/denies pain  Patient Saturations on Room Air at Rest = 89-94% Patient Saturations on Room Air while Ambulating = 82-90% Patient Saturations on 2 Liters of oxygen while Ambulating = 90-95%    Home Living  Prior Function            PT Goals (current goals can now be found in the care plan section) Acute Rehab PT Goals Patient Stated Goal: To go home today hopefully PT Goal Formulation: With patient Time For Goal Achievement: 06/21/15 Potential to Achieve Goals: Good Progress towards PT goals: Progressing toward goals    Frequency  Min 3X/week    PT Plan Current plan remains appropriate    Co-evaluation             End of Session Equipment Utilized During Treatment: Gait belt;Oxygen Activity  Tolerance: Patient tolerated treatment well Patient left: in chair;with call bell/phone within reach;with chair alarm set     Time: 1415-1435 PT Time Calculation (min) (ACUTE ONLY): 20 min  Charges:  $Gait Training: 8-22 mins                    G Codes:      Colon Branch, SPT Colon Branch 06/15/2015, 4:01 PM

## 2015-06-15 NOTE — Discharge Instructions (Signed)
It has been a pleasure taking care of you! You were admitted due to shortness of breath, cough and confusion. We think your shortness of breath and cough is likely due to COPD exacerbation. We treated you with steroid, antibiotics and breathing treatment and your breathing improved. We are discharging you on more of the steroid and antibiotics in addition to your home breathing treatments.   It is uclear why you had confusion. Imaging (CT) of your head, face and neck didn't show any bleeding in your brain or bone fracture. The other tests we did also couldn't explain. The good thing is that it has resolved now.  We may have also made some adjustments to your other medications. Please, make sure to read the directions before you take them. The names and directions on how to take these medications are found on this discharge paper under medication section.   Take care,

## 2015-06-15 NOTE — Progress Notes (Signed)
SATURATION QUALIFICATIONS: (This note is used to comply with regulatory documentation for home oxygen)  Patient Saturations on Room Air at Rest = 89-94%  Patient Saturations on Room Air while Ambulating = 82-90%  Patient Saturations on 2 Liters of oxygen while Ambulating = 90-95%  Please briefly explain why patient needs home oxygen: Pt desaturates with activity on room air, needs 2L [place] O2 to keep SaO2 greater than 90% with activity. Gave pt incentive spirometer and educated on use. Pt compliant.   -Colon Branch, SPT

## 2015-06-15 NOTE — Progress Notes (Signed)
   06/15/15 1123  OT G-codes **NOT FOR INPATIENT CLASS**  Functional Assessment Tool Used Clinical observation  Functional Limitation Self care  Self Care Current Status [phone]) CI  Self Care Goal Status [id number]) CI  Self Care Discharge Status [phone]) [place], OTR/L [id number] 06/15/2015 '

## 2015-06-16 ENCOUNTER — Ambulatory Visit: Payer: Medicare Other | Admitting: Speech Pathology

## 2015-06-16 DIAGNOSIS — I252 Old myocardial infarction: Secondary | ICD-10-CM | POA: Diagnosis not present

## 2015-06-16 DIAGNOSIS — M150 Primary generalized (osteo)arthritis: Secondary | ICD-10-CM | POA: Diagnosis not present

## 2015-06-16 DIAGNOSIS — Z87891 Personal history of nicotine dependence: Secondary | ICD-10-CM | POA: Diagnosis not present

## 2015-06-16 DIAGNOSIS — J441 Chronic obstructive pulmonary disease with (acute) exacerbation: Secondary | ICD-10-CM | POA: Diagnosis not present

## 2015-06-16 DIAGNOSIS — I2510 Atherosclerotic heart disease of native coronary artery without angina pectoris: Secondary | ICD-10-CM | POA: Diagnosis not present

## 2015-06-16 DIAGNOSIS — I129 Hypertensive chronic kidney disease with stage 1 through stage 4 chronic kidney disease, or unspecified chronic kidney disease: Secondary | ICD-10-CM | POA: Diagnosis not present

## 2015-06-16 DIAGNOSIS — N183 Chronic kidney disease, stage 3 (moderate): Secondary | ICD-10-CM | POA: Diagnosis not present

## 2015-06-16 DIAGNOSIS — K219 Gastro-esophageal reflux disease without esophagitis: Secondary | ICD-10-CM | POA: Diagnosis not present

## 2015-06-16 DIAGNOSIS — M4806 Spinal stenosis, lumbar region: Secondary | ICD-10-CM | POA: Diagnosis not present

## 2015-06-16 DIAGNOSIS — Z950 Presence of cardiac pacemaker: Secondary | ICD-10-CM | POA: Diagnosis not present

## 2015-06-16 DIAGNOSIS — E785 Hyperlipidemia, unspecified: Secondary | ICD-10-CM | POA: Diagnosis not present

## 2015-06-16 DIAGNOSIS — I4891 Unspecified atrial fibrillation: Secondary | ICD-10-CM | POA: Diagnosis not present

## 2015-06-16 DIAGNOSIS — F419 Anxiety disorder, unspecified: Secondary | ICD-10-CM | POA: Diagnosis not present

## 2015-06-16 NOTE — Progress Notes (Signed)
Cardiology Office Note    Date:  06/17/2015   ID:  [name], DOB 05-06-1923, MRN [id number]  PCP:  MCDIARMID,TODD D, MD  Cardiologist:  Dr Smith/ Dr Lovena Le   SOB  History of Present Illness:  [name] is a 82 y.o. female with a history of CAD s/p BMS to RCA w/ residual LCx disease (09/2008), severe COPD, CAD, CKD s/p nephrectomy for RCC, PAF on Eliquis and amiodarone, HTN, SSS s/p Medtronic PPM who presents to clinic for evaluation of SOB.  She saw Dr. [name] in 05/2014 for evaluation of chest pain that was felt to be due to atrial fibrillation with RVR. She had a subsequent Myoview in 05/2014 that was low risk.  She was seen in the [facility] ER on 05/20/15 for chest pain non responsive to SL NTG lasting over 2 hours. She ruled out for MI. She stayed overnight in the emergency room secondary to have the hospital being full. In the morning she was feeling better requested to go home. The patient did not want to pursue aggressive measurements.  I saw her in clinic on 05/29/15 for follow-up of chest pain. She was feeling quite well and planning to go on it cruise with her daughters.  She had a mechanical fall while on the cruise with significant ecchymosis and bruising. She did hit her head. She was told to go to the ER when she got off the cruise ship.  She was admitted to [facility] from 4/15-4/17/17. She initially presented with altered mental status as well as shortness of breath. CT head showed mod midline frontal scalp contusion without other acute abnormality. She was diagnosed with acute COPD exacerbation and treated with steroids, doxycycline and duo nebs. She was discharged on 2 L home O2.  Today she presents to clinic today. Since discharge she has been having continue SOB as well as cough. No chest pain. She has had some LE edema, chronic 2 pillow orthopnea, or PND. She also has gained 7 lbs. No dizziness or syncope. Mentation is back to baseline. She is here with her daughter  who thinks that all this is caused by the Eliquis. She never had any of these issues when she was on Coumadin. I reassured her that none of these symptoms were likely related to the Eliquis.    Past Medical History  Diagnosis Date  . Candidiasis of the esophagus 11/26/2007  . Myocardial infarct, old   . COPD, severe   . Sick sinus syndrome with tachycardia ([place])   . Pacemaker   . Chronic kidney disease (CKD), stage III (moderate)   . Hypertension   . Acute appendicitis with rupture   . Spinal stenosis, lumbar   . Adrenal adenoma     Incidentaloma  . Lumbar herniated disc     History of HNP L4/5 in 2003  . Hx of colonoscopy with polypectomy 04/27/2010    Dr Cristina Gong found three  tubular adenomas each less than 10 mm size  . Retinal hemorrhage of left eye 06/2010  . Cataract 2013    Bilateral   . Abnormal mammogram, unspecified 08/23/2010    Followup imaging reassuring.  Repeat in 6 months.   Marland Kitchen DISC WITH RADICULOPATHY 04/27/2006    Qualifier: Diagnosis of  By: McDiarmid MD, Sherren Mocha    . Transitional cell carcinoma of ureter, history   . Mild cognitive impairment 10/26/2012    (10/25/12) Failed MiniCog screen  . 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 Normal uric acid serum level at time of attack.    Marland Kitchen EDEMA-LEGS,DUE TO VENOUS OBSTRUCT. 04/27/2006    Qualifier: Diagnosis of  By: McDiarmid MD, Sherren Mocha    . ANXIETY 04/27/2006    Qualifier: Diagnosis of  By: McDiarmid MD, Sherren Mocha    . HERNIA, HIATAL, NONCONGENITAL 04/27/2006    Qualifier: Diagnosis of  By: McDiarmid MD, Sherren Mocha    . History of Hemorrhoids 04/27/2006    Qualifier: Diagnosis of  By: McDiarmid MD, Sherren Mocha    . RHINITIS, ALLERGIC 04/27/2006    Qualifier: Diagnosis of  By: McDiarmid MD, Sherren Mocha    . SCHATZKI'S RING, HX OF 11/26/2007    Qualifier: Diagnosis of  By: McDiarmid MD, Sherren Mocha  An EGD was performed by Dr Cristina Gong on 04/27/2010 for iron deficiency anemia. There was  a a transient hiatal hernia with Schatzki's ring. Stomach and duodenum were normal. EGD on 03/06/12 by Dr Cristina Gong for IDA non-obstructing Schatzki's ring at Gastroesophageal junction, otherwise normal esophagus and stomach.    . Urge incontinence 12/13/2011    Diagnosed in 10/2011 by Dr Bjorn Loser (Urology)   . VITAMIN B12 DEFICIENCY 10/07/2009    Qualifier: Diagnosis of  By: McDiarmid MD, Sherren Mocha  Dx based on a post-TKR anemia work-up Low normal serum B12 with high Methylmalonic acid and homocysteine level  Vit B12 serum level (10/28/10) > 1500 pg/mL   . Vitamin D deficiency 11/02/2010    Serum vitamin D 25(OH) = 10.9 ng/mL (30 -100) on 10/28/10 c/w Vitamin D deficiency.     . AF (paroxysmal atrial fibrillation) ([place]) 11/11/2010    Hospitalization (9/8-9/10, Dr Daneen Schick, III, Cardiology) for Paroxysmal Atrial Fibrillation with RVR and anginal pain secondary to demand/supply mismatch in setting of RVR with known circumflex artery branch disease.    . Iron deficiency anemia 08/05/2010    Dr Cristina Gong (GI) has evaluated with EGD, colonoscopy, and video capsular endoscopy in 2011 & 2012.  All have been unrevealing as to an origin of IDA.  OV with Dr Cristina Gong (10/28/10) assessment of blood in stool per hemoccult and GER. Hbg 12.1 g/dL, MCV 91.8, Ferritin 30 ng/mL. Patient taking on ferrous sulfate tab daily.   EGD on 03/06/12 by Dr Cristina Gong for IDA non-obstructing Schatzki's ring at Gastroesophageal junction, otherwise normal esophagus and stomach.     . Macular degeneration, bilateral 10/04/2010    Right eye is wet MD, the other is dry macular degeneration (ARMD). Pt undergoing some form of vascular endothelial growth factor inhibition intraocular therapy.    . VENTRICULAR HYPERTROPHY, LEFT 08/28/2008    Qualifier: Diagnosis of  By: McDiarmid MD, Sherren Mocha    . COPD 04/27/2006    Qualifier: Diagnosis of  By: McDiarmid MD, Sherren Mocha    . Solitary kidney, acquired 05/17/2010    Surgical removal for transitional cell cancer by  Tresa Endo, MD ([facility]). Surveillance cystoscopy by Dr Alinda Money [facility]) on 10/19/12 without evidence of cystoscopic recurrence. Recommend RTC one year for cystoscopy.   . ADENOMATOUS COLONIC POLYP 03/01/2003    Qualifier: Diagnosis of  By: McDiarmid MD, Sherren Mocha Multiple benign polyps of cecum, ascending, transverse and sigmoid colon by 8/09 colonoscopy by Dr Cristina Gong  Colonoscopy by Dr Cristina Gong for iron-deficiency anemia on 04/27/2010 showed three sessile polyps that were in ascending (3 mm x 9 mm), transverse (4 mm), and cecum (3 mm).  All three were tubular adenomas that were negative for high grade dysplasia or malignancy  on pathology. Dr Cristina Gong called the polpys benign and not requiring follow-up in view of the patients age.    . Soft tissue injury of foot 05/03/2011  . PREDIABETES 09/11/2007    Qualifier: Diagnosis of  By: McDiarmid MD, Sherren Mocha    . Numbness and tingling in hands 07/21/2011  . MUSCLE CRAMPS 03/11/2010    Qualifier: Diagnosis of  By: McDiarmid MD, Sherren Mocha    . Leg cramps 05/29/2012  . Cellulitis of leg, right 10/01/2012  . Solar lentigo 06/15/2012  . Muscle spasm of back 09/24/2013  . Shoulder pain, left 01/09/2014  . Seborrheic keratosis, right anterior thigh 12/12/2013  . High risk medications (not anticoagulants) long-term use 03/05/2012  . CAP (community acquired pneumonia) 02/25/2015  . Decreased functional mobility and endurance 03/17/2015    Past Surgical History  Procedure Laterality Date  . Pacemaker insertion      Dr Lovena Le ([facility])  . Nephrectomy  For transition cell cancer     Dr Tresa Endo, surgeon  . Replacement total knee  2009, Right knee    Dr Wynelle Link  . Replacement total knee  05/2009, Left knee    Dr Wynelle Link  . Knee arthroscopy w/ synovectomy  11/2009, left knee    Dr Wynelle Link  . Cholecystectomy    . Esophagogastroduodenoscopy  04/27/2010    Dr Cristina Gong - found transient H/H & Schatzki's ring  . Esophagogastroduodenoscopy endoscopy  03/06/2012    Dr  Cristina Gong - found non-obstuctive Schatzki's ring at [employer] [facility]. o/w normal EGD.   Marland Kitchen Pacemaker generator change N/A 11/12/2012    Procedure: PACEMAKER GENERATOR CHANGE;  Surgeon: Evans Lance, MD;  Location: [facility] CATH LAB;  Service: Cardiovascular;  Laterality: N/A;  . Tonsillectomy    . Breast surgery      breast reduction  . Eye surgery      bilateral cataracts  . Trigger finger release Right 05/22/2014    Procedure: RIGHT HAND A-1 PULLEY RELEASE ;  Surgeon: Roseanne Kaufman, MD;  Location: [facility];  Service: Orthopedics;  Laterality: Right;  . Dupuytren contracture release Right 05/22/2014    Procedure: DUPUYTREN RELEASE AND REPAIR AS NECESSARY RIGHT RING FINGER AND MIDDLE FINGER;  Surgeon: Roseanne Kaufman, MD;  Location: [place];  Service: Orthopedics;  Laterality: Right;    Current Medications: Outpatient Prescriptions Prior to Visit  Medication Sig Dispense Refill  . albuterol (VENTOLIN HFA) 108 (90 BASE) MCG/ACT inhaler Inhale 2 puffs into the lungs every 6 (six) hours as needed. If needed for shortness of breath. 1 Inhaler 3  . amiodarone (PACERONE) 200 MG tablet Take 1 tablet (200 mg total) by mouth daily. 90 tablet 3  . apixaban (ELIQUIS) 2.5 MG TABS tablet Take 1 tablet (2.5 mg total) by mouth 2 (two) times daily. 60 tablet 5  . atorvastatin (LIPITOR) 20 MG tablet Take 1 tablet (20 mg total) by mouth daily. 30 tablet 11  . bacitracin ointment Apply topically daily. 120 g 0  . cholecalciferol (VITAMIN D) 400 units TABS tablet Take 400 Units by mouth daily.    . diclofenac sodium (VOLTAREN) 1 % GEL Apply 2 g topically 4 (four) times daily. 100 g 0  . docusate sodium (COLACE) 100 MG capsule Take 100 mg by mouth daily as needed for mild constipation.    Marland Kitchen doxycycline (MONODOX) 100 MG capsule Take 1 capsule (100 mg total) by mouth 2 (two) times daily. Start this evening and until gone. 7 capsule 0  . Fluticasone-Salmeterol (ADVAIR DISKUS) 250-50 MCG/DOSE AEPB Inhale  2 puffs into the lungs 2 (two)  times daily. 60 each 11  . LORazepam (ATIVAN) 0.5 MG tablet TAKE 1 TABLET EVERY 8 HOURS AS NEEDED FOR ANXIETY OR SHORTNESS OF BREATH. (Patient taking differently: TAKE 1 TABLET BY MOUTH EVERY MORNING) 30 tablet 5  . Melatonin 3 MG TABS Take 3 mg by mouth at bedtime as needed (sleep).    . metoprolol succinate (TOPROL-XL) 50 MG 24 hr tablet Take 50 mg by mouth daily. Take with or immediately following a meal.    . nitroGLYCERIN (NITROSTAT) 0.4 MG SL tablet Place 0.4 mg under the tongue every 5 (five) minutes as needed for chest pain (x 3 tabs).    Marland Kitchen omeprazole (PRILOSEC) 20 MG capsule Take 1 capsule (20 mg total) by mouth daily. 90 capsule 3  . polyethylene glycol (MIRALAX / GLYCOLAX) packet Take 17 g by mouth 2 (two) times daily. 14 each 0  . predniSONE (DELTASONE) 50 MG tablet Take 1 tablet (50 mg total) by mouth daily with breakfast. 3 tablet 0  . tiotropium (SPIRIVA) 18 MCG inhalation capsule Place 1 capsule (18 mcg total) into inhaler and inhale daily. 30 capsule PRN  . traMADol (ULTRAM) 50 MG tablet Take 1 tablet (50 mg total) by mouth every 6 (six) hours as needed for moderate pain. 30 tablet 1  . furosemide (LASIX) 40 MG tablet Take 20 mg by mouth every Monday, Wednesday, and Friday.     No facility-administered medications prior to visit.     Allergies:   Baclofen; Codeine phosphate; Norco; Hydrochlorothiazide; and Montelukast sodium   Social History   Social History  . Marital Status: Widowed    Spouse Name: N/A  . Number of Children: 4  . Years of Education: N/A   Occupational History  . Futures trader     Retired   Social History Main Topics  . Smoking status: Former Smoker -- 1.00 packs/day    Types: Cigarettes    Quit date: 03/02/2009  . Smokeless tobacco: Never Used  . Alcohol Use: 12.0 oz/week    14 Glasses of wine, 6 Standard drinks or equivalent per week     Comment: 4-5 glasses of wine per week  . Drug Use: No  . Sexual Activity: No   Other Topics Concern    . 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    Semi-retired Futures trader.     Pt has home nebulizer for inhalation therapies.   Former Smoker   Smoking Status:  quit > 5 years ago      (+) DNR status per discussion with Dr McDiarmid 10/25/12 and reiterated 06/20/13 office visit .   (+) Living Will/Advance Directive   (+) HC-POA: Cecile Sheerer Causey (pt's dgt)     Family History:  The patient's family history includes Dementia in her mother; Heart disease in her father.   ROS:   Please see the history of present illness.    ROS  All other systems reviewed and are negative.   PHYSICAL EXAM:   VS:  BP 130/76 mmHg  Pulse 64  Ht 5' 4.5" (1.638 m)  Wt 167 lb (75.751 kg)  BMI 28.23 kg/m2   GEN: Well nourished, well developed, in no acute distresssignificant ecchymosis and bruising on neck, face and knee. HEENT:  normal Neck: no JVD, carotid bruits, or masses Cardiac: RRR; no murmurs, rubs, or gallops, Trace edema  Respiratory:  , normal work of breathing. Mild crackles at bases GI: soft, nontender, nondistended, + BS MS: no deformity or atrophy Skin: warm and dry, no rash Neuro:  Alert and Oriented x 3, Strength and sensation are intact Psych: euthymic mood, full affect  Wt Readings from Last 3 Encounters:  06/17/15 167 lb (75.751 kg)  06/14/15 165 lb 14.4 oz (75.252 kg)  05/29/15 164 lb 9.6 oz (74.662 kg)      Studies/Labs Reviewed:   EKG:  EKG is not ordered today.  Recent Labs: 12/16/2014: TSH 3.133 02/28/2015: Magnesium 1.9 06/13/2015: ALT 15 06/14/2015: B Natriuretic Peptide 744.8*; BUN 21*; Creatinine, Ser 1.59*; Hemoglobin 10.3*; Platelets 190; Potassium 3.9; Sodium 138   Lipid Panel    Component Value Date/Time   CHOL 179 05/20/2013 1604   TRIG 79 02/26/2015 1430   HDL 38*  05/20/2013 1604   CHOLHDL 4.7 05/20/2013 1604   VLDL 48* 05/20/2013 1604   LDLCALC 93 05/20/2013 1604   LDLDIRECT 74 05/08/2014 1110    Additional studies/ records that were reviewed today include:  06/24/14: Myoview Overall Impression: Low risk stress nuclear study with a small, moderate intensity, partially reversible anterior defect consistent with soft tissue attenuation and mild ischemia.  LV Ejection Fraction: 61%. LV Wall Motion: Normal Wall Motion  2D ECHO: 02/26/2015 LV EF: 55% - 60% Study Conclusions - Left ventricle: The cavity size was normal. There was mild  concentric hypertrophy. Systolic function was normal. The  estimated ejection fraction was in the range of 55% to 60%. Wall  motion was normal; there were no regional wall motion  abnormalities. Features are consistent with a pseudonormal left  ventricular filling pattern, with concomitant abnormal relaxation  and increased filling pressure (grade 2 diastolic dysfunction).  Doppler parameters are consistent with elevated ventricular  end-diastolic filling pressure. - Mitral valve: There was mild regurgitation. - Left atrium: The atrium was moderately dilated. - Right ventricle: The cavity size was normal. Wall thickness was  moderately increased. Systolic function was normal. - Right atrium: The atrium was normal in size. - Tricuspid valve: There was mild regurgitation. - Pulmonary arteries: Systolic pressure was within the normal  range. - Inferior vena cava: The vessel was normal in size. - Pericardium, extracardiac: A trivial pericardial effusion was  identified posterior to the heart. Features were not consistent  with tamponade physiology.     ASSESSMENT:    1. SOB (shortness of breath)   2. Coronary artery disease involving native coronary artery of native heart without angina pectoris   3. PAF (paroxysmal atrial fibrillation) ([place])   4. Essential hypertension   5. SSS (sick sinus  syndrome) ([facility])   6. Chronic obstructive pulmonary disease, unspecified COPD type ([place])      PLAN:  In order of problems listed above:  SOB: I think she is mildly volume overloaded. She has had an unexplained 7 pound weight gain over the past couple weeks, she is more short of breath and has had some lower extremity swelling. BNP in the hospital was around 700. He has some mild crackles on exam. She normally takes Lasix from 20 mg M, W, F. I will increase to this to 40 mg daily 3 and also prescribe potassium supplementation while she is on increased Lasix. I will order a BNP today and also a BMP today. Will repeat BMP on Friday.  CAD: s/p BMS with  residual LCx disease (2010). Low risk Myoview in 05/2014. Continue statin and BB. No ASA due to Eliquis use.   PAF: maintaining NSR by last ECG on 06/15/15. Continue amiodarone 200mg  daily, Toprol XL 25mg  daily and Eliquis 2.5mg  daily. CHADSVASC of at least 6. There was concern that all of her recent issues are due to Eliquis use. I reassured her that it is probably unrelated to Eliquis.  HTN: BP 122/78. Well controlled on current regimen   SSS s/p Medtronic PPM: followed by Dr. Lovena Le.  COPD: recent exacerbation. Now on home 02.    Medication Adjustments/Labs and Tests Ordered: Current medicines are reviewed at length with the patient today.  Concerns regarding medicines are outlined above.  Medication changes, Labs and Tests ordered today are listed in the Patient Instructions below. Patient Instructions  Medication Instructions:    Labwork:   Testing/Procedures:   Follow-Up:   Any Other Special Instructions Will Be Listed Below (If Applicable).     If you need a refill on your cardiac medications before your next appointment, please call your pharmacy.       Renea Ee  06/17/2015 3:02 PM    [facility] [address], [place], [place]  [address] Phone: [phone]; Fax:  [phone]

## 2015-06-16 NOTE — Telephone Encounter (Signed)
Thank you :)

## 2015-06-17 ENCOUNTER — Other Ambulatory Visit: Payer: Self-pay | Admitting: Physician Assistant

## 2015-06-17 ENCOUNTER — Encounter: Payer: Self-pay | Admitting: Physician Assistant

## 2015-06-17 ENCOUNTER — Ambulatory Visit (INDEPENDENT_AMBULATORY_CARE_PROVIDER_SITE_OTHER): Payer: Medicare Other | Admitting: Physician Assistant

## 2015-06-17 VITALS — BP 130/76 | HR 64 | Ht 64.5 in | Wt 167.0 lb

## 2015-06-17 DIAGNOSIS — I495 Sick sinus syndrome: Secondary | ICD-10-CM

## 2015-06-17 DIAGNOSIS — I25119 Atherosclerotic heart disease of native coronary artery with unspecified angina pectoris: Secondary | ICD-10-CM

## 2015-06-17 DIAGNOSIS — I10 Essential (primary) hypertension: Secondary | ICD-10-CM

## 2015-06-17 DIAGNOSIS — I480 Paroxysmal atrial fibrillation: Secondary | ICD-10-CM

## 2015-06-17 DIAGNOSIS — R0602 Shortness of breath: Secondary | ICD-10-CM

## 2015-06-17 DIAGNOSIS — I2510 Atherosclerotic heart disease of native coronary artery without angina pectoris: Secondary | ICD-10-CM | POA: Diagnosis not present

## 2015-06-17 DIAGNOSIS — J449 Chronic obstructive pulmonary disease, unspecified: Secondary | ICD-10-CM

## 2015-06-17 DIAGNOSIS — I4891 Unspecified atrial fibrillation: Secondary | ICD-10-CM | POA: Diagnosis not present

## 2015-06-17 MED ORDER — FUROSEMIDE 40 MG PO TABS: ORAL_TABLET | ORAL | Status: DC

## 2015-06-17 MED ORDER — POTASSIUM CHLORIDE CRYS ER 20 MEQ PO TBCR: EXTENDED_RELEASE_TABLET | ORAL | Status: DC

## 2015-06-17 NOTE — Patient Instructions (Addendum)
Medication Instructions:  Your physician has recommended you make the following change in your medication:  1.  TAKE LASIX 20 MG TAKE 1 EXTRA TABLET TODAY, TAKE 2 TABLETS ON Thursday AND Friday, THEN GO BACK TO THE REGULAR DOSE 2.  START Potassium 20 meq taking  1 tablet a day X's 3 days  Labwork: TODAY:  BNP & BMP 06/19/15:  BMP  Testing/Procedures: None ordered  Follow-Up: Your physician recommends that you schedule a follow-up appointment in: [place], PA-C (FLEX IS FINE)   Any Other Special Instructions Will Be Listed Below (If Applicable).     If you need a refill on your cardiac medications before your next appointment, please call your pharmacy.

## 2015-06-18 ENCOUNTER — Telehealth: Payer: Self-pay | Admitting: *Deleted

## 2015-06-18 ENCOUNTER — Ambulatory Visit (INDEPENDENT_AMBULATORY_CARE_PROVIDER_SITE_OTHER): Payer: Medicare Other | Admitting: Internal Medicine

## 2015-06-18 ENCOUNTER — Telehealth: Payer: Self-pay | Admitting: Physician Assistant

## 2015-06-18 ENCOUNTER — Encounter: Payer: Self-pay | Admitting: Internal Medicine

## 2015-06-18 ENCOUNTER — Encounter: Payer: Medicare Other | Admitting: Speech Pathology

## 2015-06-18 VITALS — BP 118/60 | HR 87 | Temp 97.7°F | Wt 161.3 lb

## 2015-06-18 DIAGNOSIS — J441 Chronic obstructive pulmonary disease with (acute) exacerbation: Secondary | ICD-10-CM | POA: Diagnosis not present

## 2015-06-18 DIAGNOSIS — N183 Chronic kidney disease, stage 3 (moderate): Secondary | ICD-10-CM | POA: Diagnosis not present

## 2015-06-18 DIAGNOSIS — M4806 Spinal stenosis, lumbar region: Secondary | ICD-10-CM | POA: Diagnosis not present

## 2015-06-18 DIAGNOSIS — I2510 Atherosclerotic heart disease of native coronary artery without angina pectoris: Secondary | ICD-10-CM | POA: Diagnosis not present

## 2015-06-18 DIAGNOSIS — I25119 Atherosclerotic heart disease of native coronary artery with unspecified angina pectoris: Secondary | ICD-10-CM

## 2015-06-18 DIAGNOSIS — I4891 Unspecified atrial fibrillation: Secondary | ICD-10-CM | POA: Diagnosis not present

## 2015-06-18 DIAGNOSIS — I129 Hypertensive chronic kidney disease with stage 1 through stage 4 chronic kidney disease, or unspecified chronic kidney disease: Secondary | ICD-10-CM | POA: Diagnosis not present

## 2015-06-18 LAB — BASIC METABOLIC PANEL
BUN: 35 mg/dL — ABNORMAL HIGH (ref 7–25)
CHLORIDE: 97 mmol/L — AB (ref 98–110)
CO2: 29 mmol/L (ref 20–31)
Calcium: 8.9 mg/dL (ref 8.6–10.4)
Creat: 1.46 mg/dL — ABNORMAL HIGH (ref 0.60–0.88)
GLUCOSE: 96 mg/dL (ref 65–99)
POTASSIUM: 4.7 mmol/L (ref 3.5–5.3)
SODIUM: 138 mmol/L (ref 135–146)

## 2015-06-18 LAB — CULTURE, BLOOD (ROUTINE X 2)
CULTURE: NO GROWTH
CULTURE: NO GROWTH

## 2015-06-18 LAB — BRAIN NATRIURETIC PEPTIDE: Brain Natriuretic Peptide: 739.4 pg/mL — ABNORMAL HIGH (ref <100)

## 2015-06-18 NOTE — Telephone Encounter (Signed)
Please give verbal authorization for home PT to Herbert Deaner at [facility] for 2 times a  Week for 5 weeks for fall risk reduction.  Please ask Mr [employer] to contact patient's cardiology Physician's Assistant Angelena Form to discuss reduction in patient's diuretic and/or metoprolol given Mr Hoffman's measure of Mrs Lachman orthostatic SBP drop of 40 mmHg.

## 2015-06-18 NOTE — Patient Instructions (Signed)
It was nice to see you today.   I would continue with the oxygen for about 2 weeks while you recover from your COPD flare. You can follow up with Dr. McDiarmid in 2 weeks to discuss continuing your oxygen.

## 2015-06-18 NOTE — Progress Notes (Signed)
Patient ID: RITAL CASALETTO, female   DOB: Oct 23, 1923, 82 y.o.   MRN: [id number] Date of Visit: 06/18/2015   HPI:  Patient is here for hospital follow up. She was hospitalized from 4/15/ to 4/17 for shortness of breath and confusion. She was treated for doxycycline, steroids, and neb treatments. Her AMS resolved spontaneously; etiology was unclear at that time but it was not thought to be infectious in etiology and CT head was unremarkable. Of note, two weeks prior to her hospitalization, she fell while she was on a cruise; no LOC. She was given bacitracin for laceration on her knee and was provided with [facility] PT/OT.   Patient was seen by her cardiologist on 4/18 and found to be mildly fluid overloaded with unexplained weight gain over the past couple of weeks. Therefore patient was instructed to increase Lasix to 40mg  daily for 3 days. Has a follow up lab visit tomorrow, 4/21.   COPD: - breathing well, no issues - has been on oxygen since discharge 2L - realized ran out of small oxygen tank (portable) today; denies shortness of breath with ambulation - oxygen saturation 98% while sitting on room air - as noted above, saw cardiologist yesterday. Improvement of breathing and LE swelling since increase in dose of Lasix for 3 days. She has a follow up appointment next Friday. - is working with [facility] PT/OT/nursing   AMS:  - no issues now, back at baseline - per chart review, does have mild cognitive impairment per Dr. McDiarmid   Laceration after fall: Right Knee - after fall on cruise a few weeks ago - has been changing dressings as instructed by inpatient team - is improving per patient and her daughter  ROS: See HPI.  [place]:  HFrEF COPD Chronic Anticoagulation  PHYSICAL EXAM: BP 118/60 mmHg  Pulse 87  Temp(Src) 97.7 F (36.5 C) (Oral)  Wt 161 lb 4.8 oz (73.165 kg)  SpO2 90%  With ambulation while on room air saturation to 90% and patient was symptomatic  GEN: NAD CV: RRR, no murmurs,  rubs, or gallops PULM: CTAB, normal effort SKIN: ecchymosis on face and neck ; warm and well-perfused EXTR: No lower extremity edema or calf tenderness; laceration on right knee without signs of infection  ASSESSMENT/PLAN:  COPD exacerbation (HCC) Improving symptoms and lung exam was unremarkable but pulse ox with ambulation showed ~90% on room air and patient was also symptomatic - recommended continuing 2L O2 for now - follow up in 2 weeks to re-evaulate    AMS: Patient had confusion upon admission to the hospital. Now back to baseline.   Laceration on Right Knee after fall: - no signs of infection - continue current management with dressing changes; [place] nursing also available   FOLLOW UP: Follow up in 2 weeks to re-evaluate the need for supplemental oxygen   Smiley Houseman, MD PGY [place]

## 2015-06-18 NOTE — Telephone Encounter (Signed)
pts daughter, [name], [place] on file, has been notified of pt's labs and to continue with tx plan as advised and will repeat lab 06/19/15 and see kt 06/26/15. She verbalized understanding.

## 2015-06-18 NOTE — Telephone Encounter (Signed)
Herbert Deaner, Physical Therapist with [facility] called needing verbal orders to continue care.  Physical therapy requested for two times a week for five weeks for fall risk reduction.  Patient fell on cruise and has trauma to her face and right knee.  Patient is having orthostatic hypotension seated to standing with a 40 point systolic drop.  Please give him a call at [phone].  Derl Barrow, RN

## 2015-06-18 NOTE — Telephone Encounter (Signed)
[name]/[facility] - Per PCP, gave verbal authorization for home PT for 2 times a week for 5 weeks for Fall Risk reduction.

## 2015-06-18 NOTE — Assessment & Plan Note (Signed)
Improving symptoms and lung exam was unremarkable but pulse ox with ambulation showed ~90% on room air and patient was also symptomatic - recommended continuing 2L O2 for now - follow up in 2 weeks to re-evaulate

## 2015-06-18 NOTE — Telephone Encounter (Signed)
F/u  Pt returning call- lab restults. Please call back and discuss.

## 2015-06-19 ENCOUNTER — Other Ambulatory Visit (INDEPENDENT_AMBULATORY_CARE_PROVIDER_SITE_OTHER): Payer: Medicare Other | Admitting: *Deleted

## 2015-06-19 ENCOUNTER — Telehealth: Payer: Self-pay | Admitting: Family Medicine

## 2015-06-19 ENCOUNTER — Telehealth: Payer: Self-pay | Admitting: Interventional Cardiology

## 2015-06-19 ENCOUNTER — Telehealth: Payer: Self-pay | Admitting: *Deleted

## 2015-06-19 ENCOUNTER — Other Ambulatory Visit: Payer: Self-pay | Admitting: Physician Assistant

## 2015-06-19 DIAGNOSIS — R0602 Shortness of breath: Secondary | ICD-10-CM

## 2015-06-19 DIAGNOSIS — I2510 Atherosclerotic heart disease of native coronary artery without angina pectoris: Secondary | ICD-10-CM | POA: Diagnosis not present

## 2015-06-19 DIAGNOSIS — I480 Paroxysmal atrial fibrillation: Secondary | ICD-10-CM

## 2015-06-19 DIAGNOSIS — I4891 Unspecified atrial fibrillation: Secondary | ICD-10-CM | POA: Diagnosis not present

## 2015-06-19 DIAGNOSIS — I10 Essential (primary) hypertension: Secondary | ICD-10-CM | POA: Diagnosis not present

## 2015-06-19 LAB — BASIC METABOLIC PANEL
BUN: 44 mg/dL — AB (ref 7–25)
CALCIUM: 9 mg/dL (ref 8.6–10.4)
CO2: 33 mmol/L — AB (ref 20–31)
CREATININE: 1.65 mg/dL — AB (ref 0.60–0.88)
Chloride: 95 mmol/L — ABNORMAL LOW (ref 98–110)
Glucose, Bld: 76 mg/dL (ref 65–99)
Potassium: 4.5 mmol/L (ref 3.5–5.3)
SODIUM: 137 mmol/L (ref 135–146)

## 2015-06-19 MED ORDER — POTASSIUM CHLORIDE CRYS ER 10 MEQ PO TBCR: EXTENDED_RELEASE_TABLET | ORAL | Status: DC

## 2015-06-19 NOTE — Telephone Encounter (Signed)
Spoke with daughter and informed her of recommendations per [place]. Daughter verbalized understanding and was in agreement with this plan. Verified pharmacy and will send in K+. See lab result from 4/21.

## 2015-06-19 NOTE — Telephone Encounter (Signed)
New message:  Physical Therapy evaluation after fall,resulting in hospitalization. Pt reports intermittent dizziness,blood pressure on 06-18-15,sitting down was 140/70, and standing was 100/70.Please call him or the patient to advise.

## 2015-06-19 NOTE — Telephone Encounter (Signed)
-----   Message from Eileen Stanford, PA-C sent at 06/19/2015  4:19 PM EDT ----- She should take 10 Meq kdur too. If she doesn't have any, can we call it in? Thanks

## 2015-06-19 NOTE — Telephone Encounter (Signed)
[name], pt's daughter states that pt denies any dizziness when sitting or resting, does have mild dizziness when standing.   [name] states pt did take extra lasix Wed,Thurs, and today, was instructed not to take lasix Sat or Sun and restart lasix 1/2 tablet on Monday.   [name] states pt has lost 7 pounds in the last 3 days.  [name] states BP has not been checked since yesterday, she does not have a way to check pt's BP. [name] states [facility] is scheduled to see pt this Monday.  I reviewed with Almyra Deforest, PA-  Per Isaac Laud:  Advise pt to stand in place when going from seated to standing position until dizziness resolves , have assistance when changing positions, no new medication recommendations.  Pt's daughter [name] advised, verbalized understanding.

## 2015-06-19 NOTE — Telephone Encounter (Signed)
Will forward to MD to give verbal ok for this need. Jazmin Hartsell,CMA

## 2015-06-19 NOTE — Telephone Encounter (Signed)
Guillermina City is the speech therapist from [facility] who is working with the patient, she is requesting verbal orders for a Education officer, museum to be involved with the patient. Please call her at [phone] with the verbal orders. jw

## 2015-06-22 DIAGNOSIS — I129 Hypertensive chronic kidney disease with stage 1 through stage 4 chronic kidney disease, or unspecified chronic kidney disease: Secondary | ICD-10-CM | POA: Diagnosis not present

## 2015-06-22 DIAGNOSIS — J441 Chronic obstructive pulmonary disease with (acute) exacerbation: Secondary | ICD-10-CM | POA: Diagnosis not present

## 2015-06-22 DIAGNOSIS — M4806 Spinal stenosis, lumbar region: Secondary | ICD-10-CM | POA: Diagnosis not present

## 2015-06-22 DIAGNOSIS — I2510 Atherosclerotic heart disease of native coronary artery without angina pectoris: Secondary | ICD-10-CM | POA: Diagnosis not present

## 2015-06-22 DIAGNOSIS — N183 Chronic kidney disease, stage 3 (moderate): Secondary | ICD-10-CM | POA: Diagnosis not present

## 2015-06-22 DIAGNOSIS — I4891 Unspecified atrial fibrillation: Secondary | ICD-10-CM | POA: Diagnosis not present

## 2015-06-23 ENCOUNTER — Encounter: Payer: Medicare Other | Admitting: Speech Pathology

## 2015-06-23 ENCOUNTER — Telehealth: Payer: Self-pay | Admitting: *Deleted

## 2015-06-23 DIAGNOSIS — J441 Chronic obstructive pulmonary disease with (acute) exacerbation: Secondary | ICD-10-CM | POA: Diagnosis not present

## 2015-06-23 DIAGNOSIS — I2510 Atherosclerotic heart disease of native coronary artery without angina pectoris: Secondary | ICD-10-CM | POA: Diagnosis not present

## 2015-06-23 DIAGNOSIS — I129 Hypertensive chronic kidney disease with stage 1 through stage 4 chronic kidney disease, or unspecified chronic kidney disease: Secondary | ICD-10-CM | POA: Diagnosis not present

## 2015-06-23 DIAGNOSIS — I4891 Unspecified atrial fibrillation: Secondary | ICD-10-CM | POA: Diagnosis not present

## 2015-06-23 DIAGNOSIS — N183 Chronic kidney disease, stage 3 (moderate): Secondary | ICD-10-CM | POA: Diagnosis not present

## 2015-06-23 DIAGNOSIS — M4806 Spinal stenosis, lumbar region: Secondary | ICD-10-CM | POA: Diagnosis not present

## 2015-06-23 NOTE — Telephone Encounter (Signed)
Left message at number given with Dagoberto Ligas whose voice mail said to call another manager?  If patient continues to be lethargic she should be seen at our office but would not do cbc or ua without a visit  If Nira Conn wants to contact me she can call my cell [phone]  Thanks  [place]

## 2015-06-23 NOTE — Telephone Encounter (Signed)
Please give authorization orders for speech therapy from [facility].

## 2015-06-23 NOTE — Telephone Encounter (Signed)
Herbert Deaner, Physical Therapist with [facility] left voice message on nurse line requesting verbal orders for medical social worker for community resources.  Patient reported decrease energy and appetite since starting the apixaban (ELIQUIS).  Patient's vital signs were stable today.  Patient has been having trouble trying to get an appointment with PCP.  Please give Clair Gulling a call at [phone].  Derl Barrow, RN

## 2015-06-23 NOTE — Telephone Encounter (Signed)
Heather from [facility] called stating that a nurse was out visiting patient today.  Patient vital signs are stable; however she is very fatigue and lethargic.  Requesting to complete CBC and UA. A nurse will do another home visit tomorrow.   Please give her a call at [phone] ext [phone].  Derl Barrow, RN

## 2015-06-24 DIAGNOSIS — J441 Chronic obstructive pulmonary disease with (acute) exacerbation: Secondary | ICD-10-CM | POA: Diagnosis not present

## 2015-06-24 DIAGNOSIS — N183 Chronic kidney disease, stage 3 (moderate): Secondary | ICD-10-CM | POA: Diagnosis not present

## 2015-06-24 DIAGNOSIS — I2510 Atherosclerotic heart disease of native coronary artery without angina pectoris: Secondary | ICD-10-CM | POA: Diagnosis not present

## 2015-06-24 DIAGNOSIS — I4891 Unspecified atrial fibrillation: Secondary | ICD-10-CM | POA: Diagnosis not present

## 2015-06-24 DIAGNOSIS — M4806 Spinal stenosis, lumbar region: Secondary | ICD-10-CM | POA: Diagnosis not present

## 2015-06-24 DIAGNOSIS — I129 Hypertensive chronic kidney disease with stage 1 through stage 4 chronic kidney disease, or unspecified chronic kidney disease: Secondary | ICD-10-CM | POA: Diagnosis not present

## 2015-06-24 NOTE — Telephone Encounter (Signed)
I agree with authorization for home health social worker and speech therapist for [name].

## 2015-06-24 NOTE — Progress Notes (Signed)
Cardiology Office Note    Date:  06/26/2015   ID:  [name], DOB 25-Aug-1923, MRN [id number]  PCP:  MCDIARMID,TODD D, MD  Cardiologist:  Dr Smith/ Dr Lovena Le   Follow up for acute on chronic D CHF.   History of Present Illness:  [name] is a 82 y.o. female with a history of CAD s/p BMS to RCA w/ residual LCx disease (09/2008), severe COPD, CKD s/p nephrectomy for RCC, PAF on Eliquis and amiodarone, HTN, SSS s/p Medtronic PPM who presents to clinic for evaluation for follow up of A/C D CHF.  She saw Dr. [name] in 05/2014 for evaluation of chest pain that was felt to be due to atrial fibrillation with RVR. She had a subsequent Myoview in 05/2014 that was low risk.  She was seen in the [facility] ER on 05/20/15 for chest pain non responsive to SL NTG lasting over 2 hours. She ruled out for MI. She stayed overnight in the emergency room secondary to have the hospital being full. In the morning she was feeling better requested to go home. The patient did not want to pursue aggressive measurements.  I saw her in clinic on 05/29/15 for follow-up of chest pain. She was feeling quite well and planning to go on it cruise with her daughters.  She had a mechanical fall while on the cruise with significant ecchymosis and bruising. She did hit her head. She was told to go to the ER when she got off the cruise ship.  She was admitted to [facility] from 4/15-4/17/17. She initially presented with altered mental status as well as shortness of breath. CT head showed mod midline frontal scalp contusion without other acute abnormality. She was diagnosed with acute COPD exacerbation and treated with steroids, doxycycline and duo nebs. She was discharged on 2 L home O2.  She was added onto my clinic schedule on 06/17/15 for evaluation of SOB. She also complained of some LE edema, chronic 2 pillow orthopnea, PND and 7 lbs weight gain. I ordered a BNP was mildly elevated. She was previously on lasix 20mg  M, W, Fri.  I increased lasix to 40mg  x3 days and then down to 20mg  daily. Creat did increase a little 1.46--> 1.65.  Today she presents to clinic for follow up. She does still feel some SOB. Also, has a cough with some whitish-yellow sputum, daughter worried she is infected. Does continue have DOE. No LE edema, orthopnea or PND. Weight down 3 lbs. Some dizziness but no syncope.    Past Medical History  Diagnosis Date  . Candidiasis of the esophagus 11/26/2007  . Myocardial infarct, old   . COPD, severe   . Sick sinus syndrome with tachycardia ([place])   . Pacemaker   . Chronic kidney disease (CKD), stage III (moderate)   . Hypertension   . Acute appendicitis with rupture   . Spinal stenosis, lumbar   . Adrenal adenoma     Incidentaloma  . Lumbar herniated disc     History of HNP L4/5 in 2003  . Hx of colonoscopy with polypectomy 04/27/2010    Dr Cristina Gong found three  tubular adenomas each less than 10 mm size  . Retinal hemorrhage of left eye 06/2010  . Cataract 2013    Bilateral   . Abnormal mammogram, unspecified 08/23/2010    Followup imaging reassuring.  Repeat in 6 months.   Marland Kitchen DISC WITH RADICULOPATHY 04/27/2006    Qualifier: Diagnosis of  By: McDiarmid MD,  Todd    . Transitional cell carcinoma of ureter, history   . Mild cognitive impairment 10/26/2012    (10/25/12) Failed MiniCog screen  . 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 Normal uric acid serum level at time of attack.    Marland Kitchen EDEMA-LEGS,DUE TO VENOUS OBSTRUCT. 04/27/2006    Qualifier: Diagnosis of  By: McDiarmid MD, Sherren Mocha    . ANXIETY 04/27/2006    Qualifier: Diagnosis of  By: McDiarmid MD, Sherren Mocha    . HERNIA, HIATAL, NONCONGENITAL 04/27/2006    Qualifier: Diagnosis of  By: McDiarmid MD, Sherren Mocha    . History of Hemorrhoids 04/27/2006    Qualifier: Diagnosis of  By: McDiarmid MD, Sherren Mocha    . RHINITIS, ALLERGIC 04/27/2006    Qualifier: Diagnosis of  By: McDiarmid MD,  Sherren Mocha    . SCHATZKI'S RING, HX OF 11/26/2007    Qualifier: Diagnosis of  By: McDiarmid MD, Sherren Mocha  An EGD was performed by Dr Cristina Gong on 04/27/2010 for iron deficiency anemia. There was a a transient hiatal hernia with Schatzki's ring. Stomach and duodenum were normal. EGD on 03/06/12 by Dr Cristina Gong for IDA non-obstructing Schatzki's ring at Gastroesophageal junction, otherwise normal esophagus and stomach.    . Urge incontinence 12/13/2011    Diagnosed in 10/2011 by Dr Bjorn Loser (Urology)   . VITAMIN B12 DEFICIENCY 10/07/2009    Qualifier: Diagnosis of  By: McDiarmid MD, Sherren Mocha  Dx based on a post-TKR anemia work-up Low normal serum B12 with high Methylmalonic acid and homocysteine level  Vit B12 serum level (10/28/10) > 1500 pg/mL   . Vitamin D deficiency 11/02/2010    Serum vitamin D 25(OH) = 10.9 ng/mL (30 -100) on 10/28/10 c/w Vitamin D deficiency.     . AF (paroxysmal atrial fibrillation) ([place]) 11/11/2010    Hospitalization (9/8-9/10, Dr Daneen Schick, III, Cardiology) for Paroxysmal Atrial Fibrillation with RVR and anginal pain secondary to demand/supply mismatch in setting of RVR with known circumflex artery branch disease.    . Iron deficiency anemia 08/05/2010    Dr Cristina Gong (GI) has evaluated with EGD, colonoscopy, and video capsular endoscopy in 2011 & 2012.  All have been unrevealing as to an origin of IDA.  OV with Dr Cristina Gong (10/28/10) assessment of blood in stool per hemoccult and GER. Hbg 12.1 g/dL, MCV 91.8, Ferritin 30 ng/mL. Patient taking on ferrous sulfate tab daily.   EGD on 03/06/12 by Dr Cristina Gong for IDA non-obstructing Schatzki's ring at Gastroesophageal junction, otherwise normal esophagus and stomach.     . Macular degeneration, bilateral 10/04/2010    Right eye is wet MD, the other is dry macular degeneration (ARMD). Pt undergoing some form of vascular endothelial growth factor inhibition intraocular therapy.    . VENTRICULAR HYPERTROPHY, LEFT 08/28/2008    Qualifier: Diagnosis of  By:  McDiarmid MD, Sherren Mocha    . COPD 04/27/2006    Qualifier: Diagnosis of  By: McDiarmid MD, Sherren Mocha    . Solitary kidney, acquired 05/17/2010    Surgical removal for transitional cell cancer by Tresa Endo, MD ([facility]). Surveillance cystoscopy by Dr Alinda [facility]) on 10/19/12 without evidence of cystoscopic recurrence. Recommend RTC one year for cystoscopy.   . ADENOMATOUS COLONIC POLYP 03/01/2003    Qualifier: Diagnosis of  By: McDiarmid MD, Sherren Mocha Multiple benign polyps of cecum, ascending, transverse and sigmoid colon by 8/09 colonoscopy by Dr Cristina Gong  Colonoscopy by Dr Cristina Gong for iron-deficiency anemia  on 04/27/2010 showed three sessile polyps that were in ascending (3 mm x 9 mm), transverse (4 mm), and cecum (3 mm).  All three were tubular adenomas that were negative for high grade dysplasia or malignancy on pathology. Dr Cristina Gong called the polpys benign and not requiring follow-up in view of the patients age.    . Soft tissue injury of foot 05/03/2011  . PREDIABETES 09/11/2007    Qualifier: Diagnosis of  By: McDiarmid MD, Sherren Mocha    . Numbness and tingling in hands 07/21/2011  . MUSCLE CRAMPS 03/11/2010    Qualifier: Diagnosis of  By: McDiarmid MD, Sherren Mocha    . Leg cramps 05/29/2012  . Cellulitis of leg, right 10/01/2012  . Solar lentigo 06/15/2012  . Muscle spasm of back 09/24/2013  . Shoulder pain, left 01/09/2014  . Seborrheic keratosis, right anterior thigh 12/12/2013  . High risk medications (not anticoagulants) long-term use 03/05/2012  . CAP (community acquired pneumonia) 02/25/2015  . Decreased functional mobility and endurance 03/17/2015    Past Surgical History  Procedure Laterality Date  . Pacemaker insertion      Dr Lovena Le ([facility])  . Nephrectomy  For transition cell cancer     Dr Tresa Endo, surgeon  . Replacement total knee  2009, Right knee    Dr Wynelle Link  . Replacement total knee  05/2009, Left knee    Dr Wynelle Link  . Knee arthroscopy w/ synovectomy  11/2009, left knee    Dr  Wynelle Link  . Cholecystectomy    . Esophagogastroduodenoscopy  04/27/2010    Dr Cristina Gong - found transient H/H & Schatzki's ring  . Esophagogastroduodenoscopy endoscopy  03/06/2012    Dr Cristina Gong - found non-obstuctive Schatzki's ring at [employer] [facility]. o/w normal EGD.   Marland Kitchen Pacemaker generator change N/A 11/12/2012    Procedure: PACEMAKER GENERATOR CHANGE;  Surgeon: Evans Lance, MD;  Location: [facility] CATH LAB;  Service: Cardiovascular;  Laterality: N/A;  . Tonsillectomy    . Breast surgery      breast reduction  . Eye surgery      bilateral cataracts  . Trigger finger release Right 05/22/2014    Procedure: RIGHT HAND A-1 PULLEY RELEASE ;  Surgeon: Roseanne Kaufman, MD;  Location: [place];  Service: Orthopedics;  Laterality: Right;  . Dupuytren contracture release Right 05/22/2014    Procedure: DUPUYTREN RELEASE AND REPAIR AS NECESSARY RIGHT RING FINGER AND MIDDLE FINGER;  Surgeon: Roseanne Kaufman, MD;  Location: [place];  Service: Orthopedics;  Laterality: Right;    Current Medications: Outpatient Prescriptions Prior to Visit  Medication Sig Dispense Refill  . albuterol (VENTOLIN HFA) 108 (90 BASE) MCG/ACT inhaler Inhale 2 puffs into the lungs every 6 (six) hours as needed. If needed for shortness of breath. 1 Inhaler 3  . amiodarone (PACERONE) 200 MG tablet Take 1 tablet (200 mg total) by mouth daily. 90 tablet 3  . apixaban (ELIQUIS) 2.5 MG TABS tablet Take 1 tablet (2.5 mg total) by mouth 2 (two) times daily. 60 tablet 5  . atorvastatin (LIPITOR) 20 MG tablet Take 1 tablet (20 mg total) by mouth daily. 30 tablet 11  . bacitracin ointment Apply topically daily. 120 g 0  . cholecalciferol (VITAMIN D) 400 units TABS tablet Take 400 Units by mouth daily.    . diclofenac sodium (VOLTAREN) 1 % GEL Apply 2 g topically 4 (four) times daily. 100 g 0  . docusate sodium (COLACE) 100 MG capsule Take 100 mg by mouth daily as needed for mild constipation.    Marland Kitchen  doxycycline (MONODOX) 100 MG capsule Take 1 capsule (100 mg  total) by mouth 2 (two) times daily. Start this evening and until gone. 7 capsule 0  . Fluticasone-Salmeterol (ADVAIR DISKUS) 250-50 MCG/DOSE AEPB Inhale 2 puffs into the lungs 2 (two) times daily. 60 each 11  . furosemide (LASIX) 40 MG tablet Take 2 tablets by mouth Wednesday, Thursday, & Friday Then take 1/2 tablet on every Monday, Wednesday, and Friday thereafter 30 tablet 1  . Melatonin 3 MG TABS Take 3 mg by mouth at bedtime as needed (sleep).    . metoprolol succinate (TOPROL-XL) 50 MG 24 hr tablet Take 50 mg by mouth daily. Take with or immediately following a meal.    . nitroGLYCERIN (NITROSTAT) 0.4 MG SL tablet Place 0.4 mg under the tongue every 5 (five) minutes as needed for chest pain (x 3 tabs).    Marland Kitchen omeprazole (PRILOSEC) 20 MG capsule Take 1 capsule (20 mg total) by mouth daily. 90 capsule 3  . polyethylene glycol (MIRALAX / GLYCOLAX) packet Take 17 g by mouth 2 (two) times daily. 14 each 0  . potassium chloride (K-DUR,KLOR-CON) 10 MEQ tablet Take 1 tablet by mouth daily until next office visit 15 tablet 0  . predniSONE (DELTASONE) 50 MG tablet Take 1 tablet (50 mg total) by mouth daily with breakfast. 3 tablet 0  . tiotropium (SPIRIVA) 18 MCG inhalation capsule Place 1 capsule (18 mcg total) into inhaler and inhale daily. 30 capsule PRN  . traMADol (ULTRAM) 50 MG tablet Take 1 tablet (50 mg total) by mouth every 6 (six) hours as needed for moderate pain. 30 tablet 1  . LORazepam (ATIVAN) 0.5 MG tablet TAKE 1 TABLET EVERY 8 HOURS AS NEEDED FOR ANXIETY OR SHORTNESS OF BREATH. (Patient taking differently: TAKE 1 TABLET BY MOUTH EVERY MORNING) 30 tablet 5   No facility-administered medications prior to visit.     Allergies:   Baclofen; Codeine phosphate; Norco; Hydrochlorothiazide; and Montelukast sodium   Social History   Social History  . Marital Status: Widowed    Spouse Name: N/A  . Number of Children: 4  . Years of Education: N/A   Occupational History  . Futures trader      Retired   Social History Main Topics  . Smoking status: Former Smoker -- 1.00 packs/day    Types: Cigarettes    Quit date: 03/02/2009  . Smokeless tobacco: Never Used  . Alcohol Use: 12.0 oz/week    14 Glasses of wine, 6 Standard drinks or equivalent per week     Comment: 4-5 glasses of wine per week  . Drug Use: No  . Sexual Activity: No   Other Topics Concern  . 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    Semi-retired Futures trader.     Pt has home nebulizer for inhalation therapies.   Former Smoker   Smoking Status:  quit > 5 years ago      (+) DNR status per discussion with Dr McDiarmid 10/25/12 and reiterated 06/20/13 office visit .   (+) Living Will/Advance Directive   (+) HC-POA: Cecile Sheerer Causey (pt's dgt)     Family History:  The patient's family history includes Dementia in her mother; Heart disease in her father.   ROS:   Please see the  history of present illness.    ROS All other systems reviewed and are negative.   PHYSICAL EXAM:   VS:  BP 98/64 mmHg  Ht 5' 4.5" (1.638 m)  Wt 158 lb 12.8 oz (72.031 kg)  BMI 26.85 kg/m2  SpO2 88%   GEN: Well nourished, well developed, in no acute distress diminishing bruises HEENT: normal Neck: no JVD, carotid bruits, or masses Cardiac: RRR; no murmurs, rubs, or gallops, Trace edema  Respiratory:  , normal work of breathing. Mild diffuse wheezing GI: soft, nontender, nondistended, + BS MS: no deformity or atrophy Skin: warm and dry, no rash Neuro:  Alert and Oriented x 3, Strength and sensation are intact Psych: euthymic mood, full affect  Wt Readings from Last 3 Encounters:  06/26/15 158 lb 12.8 oz (72.031 kg)  06/18/15 161 lb 4.8 oz (73.165 kg)  06/17/15 167 lb (75.751 kg)      Studies/Labs Reviewed:   EKG:  EKG is ordered  today. This reveals NSR with pacemaker HR 63  Recent Labs: 12/16/2014: TSH 3.133 02/28/2015: Magnesium 1.9 06/13/2015: ALT 15 06/14/2015: B Natriuretic Peptide 744.8*; Hemoglobin 10.3*; Platelets 190 06/19/2015: BUN 44*; Creat 1.65*; Potassium 4.5; Sodium 137   Lipid Panel    Component Value Date/Time   CHOL 179 05/20/2013 1604   TRIG 79 02/26/2015 1430   HDL 38* 05/20/2013 1604   CHOLHDL 4.7 05/20/2013 1604   VLDL 48* 05/20/2013 1604   LDLCALC 93 05/20/2013 1604   LDLDIRECT 74 05/08/2014 1110    Additional studies/ records that were reviewed today include:  06/24/14: Myoview Overall Impression: Low risk stress nuclear study with a small, moderate intensity, partially reversible anterior defect consistent with soft tissue attenuation and mild ischemia.  LV Ejection Fraction: 61%. LV Wall Motion: Normal Wall Motion  2D ECHO: 02/26/2015 LV EF: 55% - 60% Study Conclusions - Left ventricle: The cavity size was normal. There was mild  concentric hypertrophy. Systolic function was normal. The  estimated ejection fraction was in the range of 55% to 60%. Wall  motion was normal; there were no regional wall motion  abnormalities. Features are consistent with a pseudonormal left  ventricular filling pattern, with concomitant abnormal relaxation  and increased filling pressure (grade 2 diastolic dysfunction).  Doppler parameters are consistent with elevated ventricular  end-diastolic filling pressure. - Mitral valve: There was mild regurgitation. - Left atrium: The atrium was moderately dilated. - Right ventricle: The cavity size was normal. Wall thickness was  moderately increased. Systolic function was normal. - Right atrium: The atrium was normal in size. - Tricuspid valve: There was mild regurgitation. - Pulmonary arteries: Systolic pressure was within the normal  range. - Inferior vena cava: The vessel was normal in size. - Pericardium, extracardiac: A trivial  pericardial effusion was  identified posterior to the heart. Features were not consistent  with tamponade physiology.     ASSESSMENT:    1. Acute on chronic diastolic heart failure ([place])   2. Coronary artery disease involving native coronary artery of native heart without angina pectoris   3. PAF (paroxysmal atrial fibrillation) ([place])   4. Essential hypertension   5. SSS (sick sinus syndrome) ([facility])   6. Chronic obstructive pulmonary disease, unspecified COPD type ([place])   7. CKD (chronic kidney disease), unspecified stage   8. Cough   9. Chronic fatigue      PLAN:  In order of problems listed above:  A/C D CHF: Weight down 3lbs. SOB had improved, but still not back to  baseline.  Will repeat BMP today. If stable would continue Lasix 20mg  daily. Still some SOB that i I think is related to COPD.  CAD: s/p BMS with residual LCx disease (2010). Low risk Myoview in 05/2014. Continue statin and BB. No ASA due to Eliquis use.   PAF: ECG today with NSR . Continue amiodarone 200mg  daily, Toprol XL 25mg  daily and Eliquis 2.5mg  daily. CHADSVASC of at least 6. There was concern that all of her recent issues are due to Eliquis use. I reassured her that it is probably unrelated to Eliquis.  HTN: BP soft today 98/64. Continue current regimen   SSS s/p Medtronic PPM: followed by Dr. Lovena Le.  COPD: recent exacerbation. Now on home 02.   CKD: Creat did increase a little 1.46--> 1.65. Will order BMP today.  Cough: will defer to Dr Brooke Dare. She is seeing him next week. Will order a CBC with diff. She denies fevers and chills.  Fatigue: will check CBC as above   Medication Adjustments/Labs and Tests Ordered: Current medicines are reviewed at length with the patient today.  Concerns regarding medicines are outlined above.  Medication changes, Labs and Tests ordered today are listed in the Patient Instructions below. Patient Instructions  Medication Instructions:  Your physician recommends  that you continue on your current medications as directed. Please refer to the Current Medication list given to you today.  Labwork: TODAY:  CBC W/DIFF  Testing/Procedures: None ordered  Follow-Up: Your physician recommends that you schedule a follow-up appointment in: [place]   Any Other Special Instructions Will Be Listed Below (If Applicable).     If you need a refill on your cardiac medications before your next appointment, please call your pharmacy.      [name]  06/26/2015 2:34 PM    [facility] [place], [place], [place]  [address] Phone: [phone]; Fax: [phone]

## 2015-06-24 NOTE — Telephone Encounter (Signed)
[name] (not [place]) was given verbal orders for Education officer, museum.  She will consult with MD and send orders to be signed if agreeable Siren Porrata, Salome Spotted, [place]

## 2015-06-25 ENCOUNTER — Telehealth: Payer: Self-pay | Admitting: Family Medicine

## 2015-06-25 ENCOUNTER — Encounter: Payer: Medicare Other | Admitting: Speech Pathology

## 2015-06-25 DIAGNOSIS — M4806 Spinal stenosis, lumbar region: Secondary | ICD-10-CM | POA: Diagnosis not present

## 2015-06-25 DIAGNOSIS — I4891 Unspecified atrial fibrillation: Secondary | ICD-10-CM | POA: Diagnosis not present

## 2015-06-25 DIAGNOSIS — I2510 Atherosclerotic heart disease of native coronary artery without angina pectoris: Secondary | ICD-10-CM | POA: Diagnosis not present

## 2015-06-25 DIAGNOSIS — J441 Chronic obstructive pulmonary disease with (acute) exacerbation: Secondary | ICD-10-CM | POA: Diagnosis not present

## 2015-06-25 DIAGNOSIS — N183 Chronic kidney disease, stage 3 (moderate): Secondary | ICD-10-CM | POA: Diagnosis not present

## 2015-06-25 DIAGNOSIS — I129 Hypertensive chronic kidney disease with stage 1 through stage 4 chronic kidney disease, or unspecified chronic kidney disease: Secondary | ICD-10-CM | POA: Diagnosis not present

## 2015-06-25 NOTE — Telephone Encounter (Signed)
Will forward to MD to give verbal ok for this order. Jazmin Hartsell,CMA

## 2015-06-25 NOTE — Telephone Encounter (Signed)
Have left knee wound.  Recommended by wound nurse that silicon dressing be applied and done so weekly.  Please fax order to [phone] or call in verbal to [facility].

## 2015-06-25 NOTE — Telephone Encounter (Signed)
Pls calll in verbal order for below  Thanks  [place]

## 2015-06-26 ENCOUNTER — Encounter: Payer: Self-pay | Admitting: Physician Assistant

## 2015-06-26 ENCOUNTER — Ambulatory Visit (INDEPENDENT_AMBULATORY_CARE_PROVIDER_SITE_OTHER): Payer: Medicare Other | Admitting: Physician Assistant

## 2015-06-26 VITALS — BP 98/64 | Ht 64.5 in | Wt 158.8 lb

## 2015-06-26 DIAGNOSIS — R059 Cough, unspecified: Secondary | ICD-10-CM

## 2015-06-26 DIAGNOSIS — I5033 Acute on chronic diastolic (congestive) heart failure: Secondary | ICD-10-CM

## 2015-06-26 DIAGNOSIS — R5382 Chronic fatigue, unspecified: Secondary | ICD-10-CM

## 2015-06-26 DIAGNOSIS — J441 Chronic obstructive pulmonary disease with (acute) exacerbation: Secondary | ICD-10-CM | POA: Diagnosis not present

## 2015-06-26 DIAGNOSIS — J449 Chronic obstructive pulmonary disease, unspecified: Secondary | ICD-10-CM

## 2015-06-26 DIAGNOSIS — I10 Essential (primary) hypertension: Secondary | ICD-10-CM | POA: Diagnosis not present

## 2015-06-26 DIAGNOSIS — M4806 Spinal stenosis, lumbar region: Secondary | ICD-10-CM | POA: Diagnosis not present

## 2015-06-26 DIAGNOSIS — N189 Chronic kidney disease, unspecified: Secondary | ICD-10-CM

## 2015-06-26 DIAGNOSIS — I2510 Atherosclerotic heart disease of native coronary artery without angina pectoris: Secondary | ICD-10-CM

## 2015-06-26 DIAGNOSIS — R05 Cough: Secondary | ICD-10-CM

## 2015-06-26 DIAGNOSIS — I495 Sick sinus syndrome: Secondary | ICD-10-CM

## 2015-06-26 DIAGNOSIS — I480 Paroxysmal atrial fibrillation: Secondary | ICD-10-CM

## 2015-06-26 DIAGNOSIS — I129 Hypertensive chronic kidney disease with stage 1 through stage 4 chronic kidney disease, or unspecified chronic kidney disease: Secondary | ICD-10-CM | POA: Diagnosis not present

## 2015-06-26 DIAGNOSIS — N183 Chronic kidney disease, stage 3 (moderate): Secondary | ICD-10-CM | POA: Diagnosis not present

## 2015-06-26 DIAGNOSIS — I4891 Unspecified atrial fibrillation: Secondary | ICD-10-CM | POA: Diagnosis not present

## 2015-06-26 DIAGNOSIS — I25119 Atherosclerotic heart disease of native coronary artery with unspecified angina pectoris: Secondary | ICD-10-CM

## 2015-06-26 LAB — CBC WITH DIFFERENTIAL/PLATELET
BASOS ABS: 0 {cells}/uL (ref 0–200)
Basophils Relative: 0 %
EOS ABS: 88 {cells}/uL (ref 15–500)
Eosinophils Relative: 1 %
HCT: 31.9 % — ABNORMAL LOW (ref 35.0–45.0)
Hemoglobin: 10.4 g/dL — ABNORMAL LOW (ref 11.7–15.5)
LYMPHS PCT: 14 %
Lymphs Abs: 1232 cells/uL (ref 850–3900)
MCH: 30.4 pg (ref 27.0–33.0)
MCHC: 32.6 g/dL (ref 32.0–36.0)
MCV: 93.3 fL (ref 80.0–100.0)
MONOS PCT: 11 %
MPV: 10.1 fL (ref 7.5–12.5)
Monocytes Absolute: 968 cells/uL — ABNORMAL HIGH (ref 200–950)
NEUTROS PCT: 74 %
Neutro Abs: 6512 cells/uL (ref 1500–7800)
PLATELETS: 285 10*3/uL (ref 140–400)
RBC: 3.42 MIL/uL — ABNORMAL LOW (ref 3.80–5.10)
RDW: 13.2 % (ref 11.0–15.0)
WBC: 8.8 10*3/uL (ref 3.8–10.8)

## 2015-06-26 NOTE — Patient Instructions (Addendum)
Medication Instructions:  Your physician recommends that you continue on your current medications as directed. Please refer to the Current Medication list given to you today.  Labwork: TODAY:  CBC W/DIFF  Testing/Procedures: None ordered  Follow-Up: Your physician recommends that you schedule a follow-up appointment in: [place]   Any Other Special Instructions Will Be Listed Below (If Applicable).     If you need a refill on your cardiac medications before your next appointment, please call your pharmacy.

## 2015-06-26 NOTE — Telephone Encounter (Signed)
Verbal orders given  

## 2015-06-27 ENCOUNTER — Other Ambulatory Visit: Payer: Self-pay | Admitting: Physician Assistant

## 2015-06-27 DIAGNOSIS — N189 Chronic kidney disease, unspecified: Secondary | ICD-10-CM

## 2015-06-29 ENCOUNTER — Telehealth: Payer: Self-pay | Admitting: *Deleted

## 2015-06-29 ENCOUNTER — Telehealth: Payer: Self-pay | Admitting: Interventional Cardiology

## 2015-06-29 ENCOUNTER — Other Ambulatory Visit (INDEPENDENT_AMBULATORY_CARE_PROVIDER_SITE_OTHER): Payer: Medicare Other

## 2015-06-29 DIAGNOSIS — I2510 Atherosclerotic heart disease of native coronary artery without angina pectoris: Secondary | ICD-10-CM | POA: Diagnosis not present

## 2015-06-29 DIAGNOSIS — N183 Chronic kidney disease, stage 3 (moderate): Secondary | ICD-10-CM | POA: Diagnosis not present

## 2015-06-29 DIAGNOSIS — N189 Chronic kidney disease, unspecified: Secondary | ICD-10-CM | POA: Diagnosis not present

## 2015-06-29 DIAGNOSIS — J441 Chronic obstructive pulmonary disease with (acute) exacerbation: Secondary | ICD-10-CM | POA: Diagnosis not present

## 2015-06-29 DIAGNOSIS — I4891 Unspecified atrial fibrillation: Secondary | ICD-10-CM | POA: Diagnosis not present

## 2015-06-29 DIAGNOSIS — I129 Hypertensive chronic kidney disease with stage 1 through stage 4 chronic kidney disease, or unspecified chronic kidney disease: Secondary | ICD-10-CM | POA: Diagnosis not present

## 2015-06-29 DIAGNOSIS — M4806 Spinal stenosis, lumbar region: Secondary | ICD-10-CM | POA: Diagnosis not present

## 2015-06-29 LAB — BASIC METABOLIC PANEL
BUN: 28 mg/dL — AB (ref 7–25)
CHLORIDE: 103 mmol/L (ref 98–110)
CO2: 28 mmol/L (ref 20–31)
Calcium: 8.8 mg/dL (ref 8.6–10.4)
Creat: 1.58 mg/dL — ABNORMAL HIGH (ref 0.60–0.88)
GLUCOSE: 96 mg/dL (ref 65–99)
POTASSIUM: 4.4 mmol/L (ref 3.5–5.3)
Sodium: 140 mmol/L (ref 135–146)

## 2015-06-29 NOTE — Telephone Encounter (Signed)
[facility] calling to report patient having a cardiac episode on Saturday morning, she was c/o of left sharp jaw pain.  Took nitro, 4 baby aspirin and then another nitro.  She reported the pain leaving after this.  Patient complained of this same right jaw pain for about 20 mins but it went away without any medication.  She will contact cardiologist to inform them of this also. Offie Waide,CMA

## 2015-06-29 NOTE — Telephone Encounter (Signed)
Pt states on Saturday she had pain in her jaw on the right side of her face, pt states she was sitting at the table.  Pt states that she took 1 NTG ,jaw pain lasted about 5 minutes then went away.   Pt states yesterday she had pain in her jaw on the right side of face again, sitting at the table. Pt states she did not take NTG, pain relieved in a few minutes.

## 2015-06-29 NOTE — Telephone Encounter (Signed)
Pt denies any other symptoms at time of jaw pain, including chest pain/shortness of breath/nausea. Pt states she had similar symptoms in the past but did not mention at office visit with Nell Range 06/26/15. Pt states she is not having any symptoms at this time.  Pt advised I will forward to Dr [name] for review.

## 2015-06-29 NOTE — Telephone Encounter (Signed)
I was unable to reach pt, I did speak with pt's daughter, [name]. [name] is aware of appt time with Gerald Stabs tomorrow, she will check her schedule and call me back today to confirm appt tomorrow.

## 2015-06-29 NOTE — Telephone Encounter (Signed)
I reviewed with Truitt Merle, NP  Per Cecille Rubin -arrange an appt for office visit this week, report to ED if symptoms recur.   I have scheduled pt to see Lucillie Garfinkel tomorrow at the [facility] at [place].

## 2015-06-29 NOTE — Telephone Encounter (Signed)
Pt cancelled appt for tomorrow and rescheduled

## 2015-06-29 NOTE — Telephone Encounter (Signed)
New message   Physical therapy calling from the patient home   Patient reported on 4.29.2017 pain in jaw - took nitro clear within  10 min and 4 baby asa.    Patient did not call PCP nor cardiologist.    Florin is asking If espidoe happens again - what would you like for patient to do.   Another episode jaw pain without medication today    Patient continue to demonstrate orthostatic laying -seating  / to seating to standing position.    Vital sign today  100/70 , standing 90/60 , no dizziness report at the time.   Per home health last several visit having blood pressure changes   Pt C/O of Chest Pain: STAT if CP now or developed within 24 hours  1. Are you having CP right now? No   2. Are you experiencing any other symptoms (ex. SOB, nausea, vomiting, sweating)? No   3. How long have you been experiencing CP? Report to home health on 4.29   4. Is your CP continuous or coming and going? Comes and leaves   5. Have you taken Nitroglycerin? Took 4 days ago with 4 baby ASA on Saturday  ?

## 2015-06-30 ENCOUNTER — Encounter: Payer: Medicare Other | Admitting: Speech Pathology

## 2015-06-30 ENCOUNTER — Telehealth: Payer: Self-pay | Admitting: *Deleted

## 2015-06-30 ENCOUNTER — Ambulatory Visit: Payer: Medicare Other | Admitting: Nurse Practitioner

## 2015-06-30 MED ORDER — POTASSIUM CHLORIDE CRYS ER 10 MEQ PO TBCR: 10.0000 meq | EXTENDED_RELEASE_TABLET | Freq: Every day | ORAL | Status: DC

## 2015-06-30 NOTE — Telephone Encounter (Signed)
Informed daughter, [name], [place] on file, that Valetta Fuller said to continue K+. Verified pharmacy and sent in prescription.

## 2015-06-30 NOTE — Telephone Encounter (Signed)
-----   Message from Eileen Stanford, PA-C sent at 06/30/2015  1:31 PM EDT ----- Yes continue potassium. Thanks

## 2015-07-01 ENCOUNTER — Telehealth: Payer: Self-pay | Admitting: Interventional Cardiology

## 2015-07-01 DIAGNOSIS — I4891 Unspecified atrial fibrillation: Secondary | ICD-10-CM | POA: Diagnosis not present

## 2015-07-01 DIAGNOSIS — I129 Hypertensive chronic kidney disease with stage 1 through stage 4 chronic kidney disease, or unspecified chronic kidney disease: Secondary | ICD-10-CM | POA: Diagnosis not present

## 2015-07-01 DIAGNOSIS — J441 Chronic obstructive pulmonary disease with (acute) exacerbation: Secondary | ICD-10-CM | POA: Diagnosis not present

## 2015-07-01 DIAGNOSIS — I2510 Atherosclerotic heart disease of native coronary artery without angina pectoris: Secondary | ICD-10-CM | POA: Diagnosis not present

## 2015-07-01 DIAGNOSIS — N183 Chronic kidney disease, stage 3 (moderate): Secondary | ICD-10-CM | POA: Diagnosis not present

## 2015-07-01 DIAGNOSIS — M4806 Spinal stenosis, lumbar region: Secondary | ICD-10-CM | POA: Diagnosis not present

## 2015-07-01 NOTE — Telephone Encounter (Signed)
Talked to [facility] with [facility]. Informed her that I will send message to Dr. [name] to see if he wants to have parameters for metoprolol.

## 2015-07-01 NOTE — Telephone Encounter (Signed)
[name] is calling because [name] Bp is 104/66. Is wanting to hold her Metoprolol until  Dr. [name] says it is ok to resume . Please call   Thanks

## 2015-07-01 NOTE — Telephone Encounter (Signed)
Has the blood pressure been in this range over several days or  is this a 1 time concern? If it is a one time concern, I would simply continue monitoring the blood pressure to give [place] more data.

## 2015-07-01 NOTE — Telephone Encounter (Signed)
Talked to [facility] with [facility]. Margaretha Sheffield stated that this was a one time concern. She will continue to have patient's BP monitored and give [place] more data.

## 2015-07-02 ENCOUNTER — Ambulatory Visit: Payer: Medicare Other

## 2015-07-02 ENCOUNTER — Encounter: Payer: Medicare Other | Admitting: Speech Pathology

## 2015-07-03 DIAGNOSIS — J441 Chronic obstructive pulmonary disease with (acute) exacerbation: Secondary | ICD-10-CM | POA: Diagnosis not present

## 2015-07-03 DIAGNOSIS — I2510 Atherosclerotic heart disease of native coronary artery without angina pectoris: Secondary | ICD-10-CM | POA: Diagnosis not present

## 2015-07-03 DIAGNOSIS — I4891 Unspecified atrial fibrillation: Secondary | ICD-10-CM | POA: Diagnosis not present

## 2015-07-03 DIAGNOSIS — I129 Hypertensive chronic kidney disease with stage 1 through stage 4 chronic kidney disease, or unspecified chronic kidney disease: Secondary | ICD-10-CM | POA: Diagnosis not present

## 2015-07-03 DIAGNOSIS — N183 Chronic kidney disease, stage 3 (moderate): Secondary | ICD-10-CM | POA: Diagnosis not present

## 2015-07-03 DIAGNOSIS — M4806 Spinal stenosis, lumbar region: Secondary | ICD-10-CM | POA: Diagnosis not present

## 2015-07-06 DIAGNOSIS — N183 Chronic kidney disease, stage 3 (moderate): Secondary | ICD-10-CM | POA: Diagnosis not present

## 2015-07-06 DIAGNOSIS — I2510 Atherosclerotic heart disease of native coronary artery without angina pectoris: Secondary | ICD-10-CM | POA: Diagnosis not present

## 2015-07-06 DIAGNOSIS — I4891 Unspecified atrial fibrillation: Secondary | ICD-10-CM | POA: Diagnosis not present

## 2015-07-06 DIAGNOSIS — J441 Chronic obstructive pulmonary disease with (acute) exacerbation: Secondary | ICD-10-CM | POA: Diagnosis not present

## 2015-07-06 DIAGNOSIS — I129 Hypertensive chronic kidney disease with stage 1 through stage 4 chronic kidney disease, or unspecified chronic kidney disease: Secondary | ICD-10-CM | POA: Diagnosis not present

## 2015-07-06 DIAGNOSIS — M4806 Spinal stenosis, lumbar region: Secondary | ICD-10-CM | POA: Diagnosis not present

## 2015-07-07 ENCOUNTER — Encounter: Payer: Medicare Other | Admitting: Speech Pathology

## 2015-07-08 ENCOUNTER — Telehealth: Payer: Self-pay | Admitting: Cardiology

## 2015-07-08 ENCOUNTER — Ambulatory Visit (INDEPENDENT_AMBULATORY_CARE_PROVIDER_SITE_OTHER): Payer: Medicare Other | Admitting: Physician Assistant

## 2015-07-08 ENCOUNTER — Encounter: Payer: Self-pay | Admitting: Physician Assistant

## 2015-07-08 ENCOUNTER — Ambulatory Visit (INDEPENDENT_AMBULATORY_CARE_PROVIDER_SITE_OTHER): Payer: Medicare Other | Admitting: Family Medicine

## 2015-07-08 ENCOUNTER — Encounter: Payer: Self-pay | Admitting: Family Medicine

## 2015-07-08 VITALS — BP 75/47 | HR 64 | Ht 64.5 in | Wt 159.0 lb

## 2015-07-08 VITALS — HR 76 | Temp 97.1°F | Ht 64.5 in | Wt 159.0 lb

## 2015-07-08 DIAGNOSIS — I9589 Other hypotension: Secondary | ICD-10-CM | POA: Diagnosis not present

## 2015-07-08 DIAGNOSIS — I480 Paroxysmal atrial fibrillation: Secondary | ICD-10-CM

## 2015-07-08 DIAGNOSIS — I952 Hypotension due to drugs: Secondary | ICD-10-CM | POA: Diagnosis not present

## 2015-07-08 DIAGNOSIS — R6884 Jaw pain: Secondary | ICD-10-CM

## 2015-07-08 DIAGNOSIS — E8770 Fluid overload, unspecified: Secondary | ICD-10-CM

## 2015-07-08 DIAGNOSIS — I2510 Atherosclerotic heart disease of native coronary artery without angina pectoris: Secondary | ICD-10-CM | POA: Diagnosis not present

## 2015-07-08 DIAGNOSIS — Z7901 Long term (current) use of anticoagulants: Secondary | ICD-10-CM

## 2015-07-08 DIAGNOSIS — I25119 Atherosclerotic heart disease of native coronary artery with unspecified angina pectoris: Secondary | ICD-10-CM

## 2015-07-08 DIAGNOSIS — N183 Chronic kidney disease, stage 3 (moderate): Secondary | ICD-10-CM | POA: Diagnosis not present

## 2015-07-08 DIAGNOSIS — M4806 Spinal stenosis, lumbar region: Secondary | ICD-10-CM | POA: Diagnosis not present

## 2015-07-08 DIAGNOSIS — I4891 Unspecified atrial fibrillation: Secondary | ICD-10-CM | POA: Diagnosis not present

## 2015-07-08 DIAGNOSIS — J441 Chronic obstructive pulmonary disease with (acute) exacerbation: Secondary | ICD-10-CM | POA: Diagnosis not present

## 2015-07-08 DIAGNOSIS — I129 Hypertensive chronic kidney disease with stage 1 through stage 4 chronic kidney disease, or unspecified chronic kidney disease: Secondary | ICD-10-CM | POA: Diagnosis not present

## 2015-07-08 MED ORDER — FUROSEMIDE 40 MG PO TABS: 20.0000 mg | ORAL_TABLET | Freq: Every day | ORAL | Status: DC

## 2015-07-08 NOTE — Patient Instructions (Signed)
Your physician has recommended you make the following change in your medication:   1.) the metoprolol has been decreased to 25 mg daily from 50 mg.  2.) the furosemide has been decreased to 20 mg daily.  Your physician recommends that you keep scheduled follow-up appointment in July 17th with Dr. [name]

## 2015-07-08 NOTE — Telephone Encounter (Signed)
-----   Message from Lauralee Evener, [place] sent at 07/08/2015  3:27 PM EDT ----- Rubin Payor,  Rhonda barrett would like for you contact this patient's daughter to discuss her device  remote box. She reports that she is having some problems with it. There's some "pieces missing" as well. The daughter says to contact her on the "908 number".   Thanks!  Mariann Laster

## 2015-07-08 NOTE — Progress Notes (Signed)
Cardiology Office Note   Date:  07/08/2015   ID:  [name], DOB [birth year], MRN [id number]  PCP:  Acquanetta Sit, MD  Cardiologist:  Dr [name], Dr Brayton Mars, PA-C   Chief Complaint  Patient presents with  . Jaw Pain    2 weeks ago, took nitro twice and then was fine, copd,     History of Present Illness: [name] is a 82 y.o. female with a history of BMS to RCA w/ residual LCx disease (09/2008), SSS s/p MDT PPM w/ gen change 2014, PAF on amio and Eliquis, COPD, HTN, CKD s/p nephrectomy for RCC.   Last ischemic assessment was a Lexi scan Myoview in April 2016 that was low risk. ER visit 05/20/2015 for chest pain, ruled out for MI and medical therapy recommended.  DC 06/15/2015 after admission for AMS and shortness of breath/COPD exacerbation. She had been on a cruise and suffered a mechanical fall. Discharged on home O2  Seen by [name] 06/17/2015 and 06/24/2015 for shortness of breath and volume overload. Her diuretics were adjusted and the symptoms improved.  [name] presents for Evaluation of jaw pain, possible anginal equivalent. Her daughter is here with her today.  [name] has been living with her daughter since she fell in April. She has been getting stronger and doing better but is still weak. She has dyspnea on exertion that is chronic. She feels her shortness of breath is at baseline. Her daughter checks her oxygen levels on a regular basis and when at rest, the level is 90% or better. She has not been wheezing. She rarely coughs.   The lower extremity edema and shortness of breath for which she was given Lasix has resolved. She is now taking a low dose of Lasix on a daily basis and tolerating it well.  About 2 weeks ago, she had 2 episodes of jaw pain. They both started at rest. There were no associated symptoms. It reached up for a 5/10. She took a sublingual nitroglycerin for each episode and resolved. She remembers her father having jaw  pain is an anginal equivalent and she is concerned about this.  [name] exerts herself on a regular basis sufficiently to get short of breath but does not have any chest pain or jaw pain with exertion. It has been a long time since she had an intervention, but she thinks that she had chest pain prior to her stent placement. The history and physical from 2010 describes crushing chest pain, no jaw pain as mentioned. She has never had the jaw pain before. She has not had chest pain.   She has been weak and dizzy recently. The dizziness is more pronounced when she is standing that she can have mild dizziness when she is sitting still.    Past Medical History  Diagnosis Date  . Candidiasis of the esophagus 11/26/2007  . Myocardial infarct, old   . COPD, severe   . Sick sinus syndrome with tachycardia ([place])     MDT  . Pacemaker     MDT  . Chronic kidney disease (CKD), stage III (moderate)   . Hypertension   . Acute appendicitis with rupture   . Spinal stenosis, lumbar   . Adrenal adenoma     Incidentaloma  . Lumbar herniated disc     History of HNP L4/5 in 2003  . Hx of colonoscopy with polypectomy 04/27/2010    Dr Cristina Gong found three  tubular  adenomas each less than 10 mm size  . Retinal hemorrhage of left eye 06/2010  . Cataract 2013    Bilateral   . Abnormal mammogram, unspecified 08/23/2010    Followup imaging reassuring.   Marland Kitchen DISC WITH RADICULOPATHY 04/27/2006    Qualifier: Diagnosis of  By: McDiarmid MD, Sherren Mocha    . Transitional cell carcinoma of ureter, history   . Mild cognitive impairment 10/26/2012    (10/25/12) Failed MiniCog screen  . 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 Normal uric acid serum level at time of attack.    Marland Kitchen EDEMA-LEGS,DUE TO VENOUS OBSTRUCT. 04/27/2006    Qualifier: Diagnosis of  By: McDiarmid MD, Sherren Mocha    . ANXIETY 04/27/2006    Qualifier: Diagnosis of  By: McDiarmid MD, Sherren Mocha    .  HERNIA, HIATAL, NONCONGENITAL 04/27/2006    Qualifier: Diagnosis of  By: McDiarmid MD, Sherren Mocha    . History of Hemorrhoids 04/27/2006    Qualifier: Diagnosis of  By: McDiarmid MD, Sherren Mocha    . RHINITIS, ALLERGIC 04/27/2006    Qualifier: Diagnosis of  By: McDiarmid MD, Sherren Mocha    . SCHATZKI'S RING, HX OF 11/26/2007    Qualifier: Diagnosis of  By: McDiarmid MD, Sherren Mocha  An EGD was performed by Dr Cristina Gong on 04/27/2010 for iron deficiency anemia. There was a a transient hiatal hernia with Schatzki's ring. Stomach and duodenum were normal. EGD on 03/06/12 by Dr Cristina Gong for IDA non-obstructing Schatzki's ring at Gastroesophageal junction, otherwise normal esophagus and stomach.    . Urge incontinence 12/13/2011    Diagnosed in 10/2011 by Dr Bjorn Loser (Urology)   . VITAMIN B12 DEFICIENCY 10/07/2009    Qualifier: Diagnosis of  By: McDiarmid MD, Sherren Mocha  Dx based on a post-TKR anemia work-up Low normal serum B12 with high Methylmalonic acid and homocysteine level  Vit B12 serum level (10/28/10) > 1500 pg/mL   . Vitamin D deficiency 11/02/2010    Serum vitamin D 25(OH) = 10.9 ng/mL (30 -100) on 10/28/10 c/w Vitamin D deficiency.     . AF (paroxysmal atrial fibrillation) ([place]) 11/11/2010    Hospitalization (9/8-9/10, Dr Daneen Schick, III, Cardiology) for Paroxysmal Atrial Fibrillation with RVR and anginal pain secondary to demand/supply mismatch in setting of RVR with known circumflex artery branch disease.    . Iron deficiency anemia 08/05/2010    Dr Cristina Gong (GI) has evaluated with EGD, colonoscopy, and video capsular endoscopy in 2011 & 2012.  All have been unrevealing as to an origin of IDA.  OV with Dr Cristina Gong (10/28/10) assessment of blood in stool per hemoccult and GER. Hbg 12.1 g/dL, MCV 91.8, Ferritin 30 ng/mL. Patient taking on ferrous sulfate tab daily.   EGD on 03/06/12 by Dr Cristina Gong for IDA non-obstructing Schatzki's ring at Gastroesophageal junction, otherwise normal esophagus and stomach.     . Macular degeneration,  bilateral 10/04/2010    Right eye is wet MD, the other is dry macular degeneration (ARMD). Pt undergoing some form of vascular endothelial growth factor inhibition intraocular therapy.    . VENTRICULAR HYPERTROPHY, LEFT 08/28/2008    Qualifier: Diagnosis of  By: McDiarmid MD, Sherren Mocha    . COPD 04/27/2006    Qualifier: Diagnosis of  By: McDiarmid MD, Sherren Mocha    . Solitary kidney, acquired 05/17/2010    Surgical removal for transitional cell cancer by Tresa Endo, MD ([place]). Surveillance cystoscopy by Dr Alinda Money [facility]) on 10/19/12  without evidence of cystoscopic recurrence. Recommend RTC one year for cystoscopy.   . ADENOMATOUS COLONIC POLYP 03/01/2003    Qualifier: Diagnosis of  By: McDiarmid MD, Sherren Mocha Multiple benign polyps of cecum, ascending, transverse and sigmoid colon by 8/09 colonoscopy by Dr Cristina Gong  Colonoscopy by Dr Cristina Gong for iron-deficiency anemia on 04/27/2010 showed three sessile polyps that were in ascending (3 mm x 9 mm), transverse (4 mm), and cecum (3 mm).  All three were tubular adenomas that were negative for high grade dysplasia or malignancy on pathology. Dr Cristina Gong called the polpys benign and not requiring follow-up in view of the patients age.    . Soft tissue injury of foot 05/03/2011  . PREDIABETES 09/11/2007    Qualifier: Diagnosis of  By: McDiarmid MD, Sherren Mocha    . Numbness and tingling in hands 07/21/2011  . MUSCLE CRAMPS 03/11/2010    Qualifier: Diagnosis of  By: McDiarmid MD, Sherren Mocha    . Leg cramps 05/29/2012  . Cellulitis of leg, right 10/01/2012  . Solar lentigo 06/15/2012  . Muscle spasm of back 09/24/2013  . Shoulder pain, left 01/09/2014  . Seborrheic keratosis, right anterior thigh 12/12/2013  . High risk medications (not anticoagulants) long-term use 03/05/2012  . CAP (community acquired pneumonia) 02/25/2015  . Decreased functional mobility and endurance 03/17/2015    Past Surgical History  Procedure Laterality Date  . Pacemaker insertion  2005    Dr Doreatha Lew  .  Nephrectomy  For transition cell cancer     Dr Tresa Endo, surgeon  . Replacement total knee  2009, Right knee    Dr Wynelle Link  . Replacement total knee  05/2009, Left knee    Dr Wynelle Link  . Knee arthroscopy w/ synovectomy  11/2009, left knee    Dr Wynelle Link  . Cholecystectomy    . Esophagogastroduodenoscopy  04/27/2010    Dr Cristina Gong - found transient H/H & Schatzki's ring  . Esophagogastroduodenoscopy endoscopy  03/06/2012    Dr Cristina Gong - found non-obstuctive Schatzki's ring at [employer] [facility]. o/w normal EGD.   Marland Kitchen Pacemaker generator change N/A 11/12/2012    Procedure: PACEMAKER GENERATOR CHANGE;  Surgeon: Evans Lance, MD; Medtronic [facility] dual-chamber pacemaker serial number [id number]; Laterality: Right  . Tonsillectomy    . Breast surgery      breast reduction  . Eye surgery      bilateral cataracts  . Trigger finger release Right 05/22/2014    Procedure: RIGHT HAND A-1 PULLEY RELEASE ;  Surgeon: Roseanne Kaufman, MD;  Location: [place];  Service: Orthopedics;  Laterality: Right;  . Dupuytren contracture release Right 05/22/2014    Procedure: DUPUYTREN RELEASE AND REPAIR AS NECESSARY RIGHT RING FINGER AND MIDDLE FINGER;  Surgeon: Roseanne Kaufman, MD;  Location: [place];  Service: Orthopedics;  Laterality: Right;  . Pacemaker lead removal  2005    Removal and reinsertion of atrial and ventricular leads due to migration  . Coronary angioplasty with stent placement  2010    BMS RCA, OM2 occluded  Medtronic Sensia dual-chamber pacemaker serial number [id number]  Current Outpatient Prescriptions  Medication Sig Dispense Refill  . albuterol (VENTOLIN HFA) 108 (90 BASE) MCG/ACT inhaler Inhale 2 puffs into the lungs every 6 (six) hours as needed. If needed for shortness of breath. 1 Inhaler 3  . amiodarone (PACERONE) 200 MG tablet Take 1 tablet (200 mg total) by mouth daily. 90 tablet 3  . apixaban (ELIQUIS) 2.5 MG TABS tablet Take 1 tablet (2.5 mg total) by mouth 2 (two) times daily.  60 tablet 5  .  atorvastatin (LIPITOR) 20 MG tablet Take 1 tablet (20 mg total) by mouth daily. 30 tablet 11  . bacitracin ointment Apply topically daily. 120 g 0  . cholecalciferol (VITAMIN D) 400 units TABS tablet Take 400 Units by mouth daily.    . diclofenac sodium (VOLTAREN) 1 % GEL Apply 2 g topically 4 (four) times daily. 100 g 0  . docusate sodium (COLACE) 100 MG capsule Take 100 mg by mouth daily as needed for mild constipation. Reported on 07/08/2015    . Fluticasone-Salmeterol (ADVAIR DISKUS) 250-50 MCG/DOSE AEPB Inhale 2 puffs into the lungs 2 (two) times daily. 60 each 11  . furosemide (LASIX) 40 MG tablet Take 2 tablets by mouth Wednesday, Thursday, & Friday Then take 1/2 tablet on every Monday, Wednesday, and Friday thereafter 30 tablet 1  . furosemide (LASIX) 40 MG tablet Take 20 mg by mouth daily.    Marland Kitchen LORazepam (ATIVAN) 0.5 MG tablet Take 0.5 mg by mouth every morning.    . Melatonin 3 MG TABS Take 3 mg by mouth at bedtime as needed (sleep).    . metoprolol succinate (TOPROL-XL) 50 MG 24 hr tablet Take 50 mg by mouth daily. Take with or immediately following a meal.    . nitroGLYCERIN (NITROSTAT) 0.4 MG SL tablet Place 0.4 mg under the tongue every 5 (five) minutes as needed for chest pain (x 3 tabs).    Marland Kitchen omeprazole (PRILOSEC) 20 MG capsule Take 1 capsule (20 mg total) by mouth daily. 90 capsule 3  . polyethylene glycol (MIRALAX / GLYCOLAX) packet Take 17 g by mouth 2 (two) times daily. 14 each 0  . potassium chloride (K-DUR,KLOR-CON) 10 MEQ tablet Take 1 tablet (10 mEq total) by mouth daily. 90 tablet 3  . tiotropium (SPIRIVA) 18 MCG inhalation capsule Place 1 capsule (18 mcg total) into inhaler and inhale daily. 30 capsule PRN  . traMADol (ULTRAM) 50 MG tablet Take 1 tablet (50 mg total) by mouth every 6 (six) hours as needed for moderate pain. 30 tablet 1   No current facility-administered medications for this visit.    Allergies:   Baclofen; Codeine phosphate; Norco; Hydrochlorothiazide;  and Montelukast sodium    Social History:  The patient  reports that she quit smoking about 6 years ago. Her smoking use included Cigarettes. She smoked 1.00 pack per day. She has never used smokeless tobacco. She reports that she drinks about 12.0 oz of alcohol per week. She reports that she does not use illicit drugs.   Family History:  The patient's family history includes Dementia in her mother; Heart disease in her father.    ROS:  Please see the history of present illness. All other systems are reviewed and negative.    PHYSICAL EXAM: VS:  BP 75/47 mmHg  Pulse 64  Ht 5' 4.5" (1.638 m)  Wt 159 lb (72.122 kg)  BMI 26.88 kg/m2 , BMI Body mass index is 26.88 kg/(m^2). GEN: Well nourished, well developed, female in no acute distress HEENT: normal for age  Neck: no JVD, no carotid bruit, no masses Cardiac: RRR;soft  murmur, no rubs, or gallops Respiratory: Decreased breath sounds bases with a few rales  bilaterally, normal work of breathingComment no wheezing  GI: soft, nontender, nondistended, + BS Tammy: no deformity or atrophy; no edema; distal pulses are 2+ in all 4 extremities  Skin: warm and dry, no rash Neuro:  Strength and sensation are intact Psych: euthymic mood, full affect  EKG:  EKG is not ordered today.    Recent Labs: 12/16/2014: TSH 3.133 02/28/2015: Magnesium 1.9 06/13/2015: ALT 15 06/14/2015: B Natriuretic Peptide 744.8* 06/26/2015: Hemoglobin 10.4*; Platelets 285 06/29/2015: BUN 28*; Creat 1.58*; Potassium 4.4; Sodium 140    Lipid Panel    Component Value Date/Time   CHOL 179 05/20/2013 1604   TRIG 79 02/26/2015 1430   HDL 38* 05/20/2013 1604   CHOLHDL 4.7 05/20/2013 1604   VLDL 48* 05/20/2013 1604   LDLCALC 93 05/20/2013 1604   LDLDIRECT 74 05/08/2014 1110     Wt Readings from Last 3 Encounters:  07/08/15 159 lb (72.122 kg)  07/08/15 159 lb (72.122 kg)  06/26/15 158 lb 12.8 oz (72.031 kg)     Other studies Reviewed: Additional studies/ records  that were reviewed today include: Office notes hospital records and previous testing   ASSESSMENT AND PLAN:  1.  Jaw pain: It was nonexertional. She has no history of exertional symptoms. It is not at all clear that this was anginal as her previous anginal pain was chest pain. I discussed the fact that she has known coronary artery disease with severe ostial disease of the OM 2, treated medically. She is not on aspirin because she is on Eliquis. She is on metoprolol XL and Lipitor.   If she has additional episodes of pain, consideration can be given to doing a stress test or adding Imdur. Because she has a solitary kidney, and renal insufficiency, I would not proceed directly to cath.   2. Hypotension, history of hypertension: Her orthostatic vital signs are are negative but her blood pressure is low. She was 83 systolic lying and 79 systolic standing. There was no significant change in her heart rate. She feels that she has had adequate by mouth intake today. However, we made her drink a glass of water before she left. We will decrease her metoprolol from 50 mg daily down to 25 mg daily. Hopefully this will allow her baseline blood pressure to run a little higher and decrease her symptoms of fatigue.  3. Volume overload: Her blood pressure is basically unchanged in the last 2 weeks. Her shortness of breath improved on the Lasix. She is tolerating low dose well. The patient and her daughter feel that her edema would return in her shortness of breath would return if she were taken off the Lasix and they wish to continue this. Will continue it for now.  4. PAF: She is having no palpitations and does not think she has been in atrial fibrillation. She is not having any bleeding issues. Continue amiodarone and Eliquis. CHADS2VASC=5  5. Sick sinus syndrome with Medtronic pacemaker: She does not have the equipment at home for a remote device check. A message has been sent to the device clinic to contact her  and get her set up for remote transmissions. Her ECG on 04/28 was atrial pacing.   Current medicines are reviewed at length with the patient today.  The patient has concerns regarding medicines.  The following changes have been made:  Decrease metoprolol  Labs/ tests ordered today include:  No orders of the defined types were placed in this encounter.     Disposition:   FU with Dr. [name] and Dr. Lovena Le  Signed, Skipper Dacosta, [place], PA-C  07/08/2015 5:22 PM    [facility] Phone: [phone]; Fax: [phone]  This note was written with the assistance of speech recognition software. Please excuse any transcriptional errors.

## 2015-07-08 NOTE — Telephone Encounter (Signed)
LMOVM for pt daughter to call me back.

## 2015-07-08 NOTE — Patient Instructions (Addendum)
Good to see you today!  Thanks for coming in.  For the blood pressure  If you start to feel lightheadness when you stand up then call us other wise keep taking the metoprolol as you are  For the Atrial Fibrillation  Keep taking Eliquis - I think it is a safer and more effective medication than warfarin  Please bring in all you medications and bottles next visit for Dr McDiarmid to review  Come back in 1-2 months for a general check up   Check to see what Mg of Atorvatstatin you are taking and call us  Also make sure the Mg of Lasix is 40 mg

## 2015-07-09 ENCOUNTER — Encounter: Payer: Medicare Other | Admitting: Speech Pathology

## 2015-07-09 DIAGNOSIS — L905 Scar conditions and fibrosis of skin: Secondary | ICD-10-CM | POA: Diagnosis not present

## 2015-07-09 DIAGNOSIS — D692 Other nonthrombocytopenic purpura: Secondary | ICD-10-CM | POA: Diagnosis not present

## 2015-07-09 DIAGNOSIS — L821 Other seborrheic keratosis: Secondary | ICD-10-CM | POA: Diagnosis not present

## 2015-07-09 DIAGNOSIS — D1801 Hemangioma of skin and subcutaneous tissue: Secondary | ICD-10-CM | POA: Diagnosis not present

## 2015-07-09 DIAGNOSIS — I4891 Unspecified atrial fibrillation: Secondary | ICD-10-CM | POA: Diagnosis not present

## 2015-07-09 DIAGNOSIS — N183 Chronic kidney disease, stage 3 (moderate): Secondary | ICD-10-CM | POA: Diagnosis not present

## 2015-07-09 DIAGNOSIS — M4806 Spinal stenosis, lumbar region: Secondary | ICD-10-CM | POA: Diagnosis not present

## 2015-07-09 DIAGNOSIS — J441 Chronic obstructive pulmonary disease with (acute) exacerbation: Secondary | ICD-10-CM | POA: Diagnosis not present

## 2015-07-09 DIAGNOSIS — L720 Epidermal cyst: Secondary | ICD-10-CM | POA: Diagnosis not present

## 2015-07-09 DIAGNOSIS — L723 Sebaceous cyst: Secondary | ICD-10-CM | POA: Diagnosis not present

## 2015-07-09 DIAGNOSIS — L814 Other melanin hyperpigmentation: Secondary | ICD-10-CM | POA: Diagnosis not present

## 2015-07-09 DIAGNOSIS — I129 Hypertensive chronic kidney disease with stage 1 through stage 4 chronic kidney disease, or unspecified chronic kidney disease: Secondary | ICD-10-CM | POA: Diagnosis not present

## 2015-07-09 DIAGNOSIS — I959 Hypotension, unspecified: Secondary | ICD-10-CM | POA: Insufficient documentation

## 2015-07-09 DIAGNOSIS — L738 Other specified follicular disorders: Secondary | ICD-10-CM | POA: Diagnosis not present

## 2015-07-09 DIAGNOSIS — I2510 Atherosclerotic heart disease of native coronary artery without angina pectoris: Secondary | ICD-10-CM | POA: Diagnosis not present

## 2015-07-09 HISTORY — DX: Hypotension, unspecified: I95.9

## 2015-07-09 NOTE — Assessment & Plan Note (Addendum)
Stable.   Discussed warfarin vs DOAC.  She will continue on her current regimen.  Her blood pressure is low but she is asymptomatic and I presume she is on metoprolol for rate control.  Continue to monitor her blood pressure.  Recenlty lowered dose of lasix may help with blood pressure

## 2015-07-09 NOTE — Progress Notes (Signed)
   Subjective:    Patient ID: [name], female    DOB: 12-09-1923, 82 y.o.   MRN: [id number]  HPI  Here to discuss medications  Atrial Fibrillation On chronic elliquis.  Wishes to go back to warfarin since feels did not cause as much bruising.  She hasd not had any heart racing or shortness of breath or gi bleeding.  Had recent fall with head trauma negative ct.  Still has mild headache but no visual changes or weakness  HYPOTENSION Disease Monitoring Home BP Monitoring (Severity) feels has been well controlled.  Thinks was recently changed from 25 mg of metoprolol to 50 Symptoms - Chest pain- takes NTG rarely    Dyspnea- chronic Medications(Modifying factors) Compliance-  Taking metoroprol 50 and recenlty a lower dose of lasix  . Lightheadedness-  None.  Able to stand or bend without lightheadness or dizzyness  Edema- trace Timing - continuous  Duration - years ROS - See HPI  PMH Lab Review   POTASSIUM  Date Value Ref Range Status  06/29/2015 4.4 3.5 - 5.3 mmol/L Final   SODIUM  Date Value Ref Range Status  06/29/2015 140 135 - 146 mmol/L Final   CREAT  Date Value Ref Range Status  06/29/2015 1.58* 0.60 - 0.88 mg/dL Final   CREATININE, SER  Date Value Ref Range Status  06/14/2015 1.59* 0.44 - 1.00 mg/dL Final       Chief Complaint noted Review of Symptoms - see HPI PMH - Smoking status noted.   Vital Signs reviewed    Review of Systems     Objective:   Physical Exam  Alert interactive accompained by her daughter Heart - Regular rate and rhythm.  No murmurs, gallops or rubs.    Lungs:  Normal respiratory effort, chest expands symmetrically. Lungs have few basliar crackles mild wheeze on forced expiration Extremities:  No cyanosis, edema, or deformity noted with good range of motion of all major joints.   Head - slight bruising frontal area Skin - mild superfical arm bruises bilaterally able to get up and down from exam table without assistance.  Walks around  room with symptoms or lightheadness        Assessment & Plan:

## 2015-07-09 NOTE — Assessment & Plan Note (Addendum)
  Asymptomatic.  Able to ambulate well.  I believe her blood pressure in office today is inaccurate  Will continue current betablocker for rate control and watch blood pressure

## 2015-07-09 NOTE — Telephone Encounter (Signed)
Spoke w/ device tech and she stated that it is ok for pt to come into the office to have PPM interrogated. Called pt daughter and informed her that this is ok. Pt daughter agreed to an appointment on 10-01-15 at 10:30 AM.

## 2015-07-09 NOTE — Telephone Encounter (Signed)
Follow Up  Pt daughter lisa called for and appt in office verses remotely please call

## 2015-07-10 DIAGNOSIS — I4891 Unspecified atrial fibrillation: Secondary | ICD-10-CM | POA: Diagnosis not present

## 2015-07-10 DIAGNOSIS — J441 Chronic obstructive pulmonary disease with (acute) exacerbation: Secondary | ICD-10-CM | POA: Diagnosis not present

## 2015-07-10 DIAGNOSIS — I2510 Atherosclerotic heart disease of native coronary artery without angina pectoris: Secondary | ICD-10-CM | POA: Diagnosis not present

## 2015-07-10 DIAGNOSIS — N183 Chronic kidney disease, stage 3 (moderate): Secondary | ICD-10-CM | POA: Diagnosis not present

## 2015-07-10 DIAGNOSIS — M4806 Spinal stenosis, lumbar region: Secondary | ICD-10-CM | POA: Diagnosis not present

## 2015-07-10 DIAGNOSIS — I129 Hypertensive chronic kidney disease with stage 1 through stage 4 chronic kidney disease, or unspecified chronic kidney disease: Secondary | ICD-10-CM | POA: Diagnosis not present

## 2015-07-13 DIAGNOSIS — I2510 Atherosclerotic heart disease of native coronary artery without angina pectoris: Secondary | ICD-10-CM | POA: Diagnosis not present

## 2015-07-13 DIAGNOSIS — N183 Chronic kidney disease, stage 3 (moderate): Secondary | ICD-10-CM | POA: Diagnosis not present

## 2015-07-13 DIAGNOSIS — J441 Chronic obstructive pulmonary disease with (acute) exacerbation: Secondary | ICD-10-CM | POA: Diagnosis not present

## 2015-07-13 DIAGNOSIS — I129 Hypertensive chronic kidney disease with stage 1 through stage 4 chronic kidney disease, or unspecified chronic kidney disease: Secondary | ICD-10-CM | POA: Diagnosis not present

## 2015-07-13 DIAGNOSIS — I4891 Unspecified atrial fibrillation: Secondary | ICD-10-CM | POA: Diagnosis not present

## 2015-07-13 DIAGNOSIS — M4806 Spinal stenosis, lumbar region: Secondary | ICD-10-CM | POA: Diagnosis not present

## 2015-07-14 ENCOUNTER — Telehealth: Payer: Self-pay | Admitting: Family Medicine

## 2015-07-14 ENCOUNTER — Encounter: Payer: Self-pay | Admitting: Emergency Medicine

## 2015-07-14 ENCOUNTER — Ambulatory Visit (INDEPENDENT_AMBULATORY_CARE_PROVIDER_SITE_OTHER): Payer: Medicare Other | Admitting: Emergency Medicine

## 2015-07-14 VITALS — BP 102/62 | HR 74 | Ht 64.5 in | Wt 161.0 lb

## 2015-07-14 DIAGNOSIS — J449 Chronic obstructive pulmonary disease, unspecified: Secondary | ICD-10-CM | POA: Diagnosis not present

## 2015-07-14 DIAGNOSIS — I25119 Atherosclerotic heart disease of native coronary artery with unspecified angina pectoris: Secondary | ICD-10-CM | POA: Diagnosis not present

## 2015-07-14 NOTE — Patient Instructions (Signed)
Please continue Spiriva daily and Advair twice a day Keep albuterol available to use as needed OXIMETRY today on room air.  Follow with cardiology as planned Follow with Dr Lamonte Sakai in 4 months or sooner if you have any problems.

## 2015-07-14 NOTE — Assessment & Plan Note (Signed)
Stable severe COPD. I believe that her recent episodes, variability and function have been related to her cardiac status. She remains hypotensive after decrease in her metoprolol last week, has cool extremities today. Walking oximetry today to establish when/if she needs to wear her supplement oxygen. I've asked her to use it any time and with any exertion that would cause her to go below 90%  Please continue Spiriva daily and Advair twice a day Keep albuterol available to use as needed OXIMETRY today on room air.  Follow with Dr Lamonte Sakai in 4 months or sooner if you have any problems.

## 2015-07-14 NOTE — Telephone Encounter (Signed)
Need to have verbal order for speech therapy to address memory.  Need to get this as soon as possible.

## 2015-07-14 NOTE — Progress Notes (Signed)
GERD  Subjective:    Patient ID: [name], female    DOB: April 29, 1923, 82 y.o.   MRN: [id number]  HPI 82 yo former smoker with a history of Horner disease, sick sinus syndrome, hypertension, chronic kidney disease, GERD with a hiatal hernia. She also has significant COPD for which she has been followed by Dr. Casper Harrison at [facility]. She was readmitted with A fib and RVR, associated with CP and dyspnea.   She has been managed on Spiriva and Advair. Has been treated with O2 at times in the past but is not on it currently She has not needed albuterol with any regularity.  She flares about once every 2 years. She has minimal coughing. She rarely wheezes. She uses lasix prn for LE edema.   She had Spirometry 2014 at [facility] >> FEV1 1.02L (60% pred).   ROV 07/14/15 -- patient has a history of CAD, hypertension, atrial fibrillation with diastolic CHF, COPD with an FEV1 of 1.02 L in 2014. Last seen in cardiology on 5/10 at which time she had been experiencing some orthostasis and hypotension. Her metoprolol was decreased from 50 mg down to 25 mg. She remains on Spiriva and Advair, uses albuterol when necessary. She was hospitalized x 2, once after a fall while on a cruise in mid-April. She was found to be hypoxemic, was told to restart her O2.     Review of Systems  Constitutional: Negative for fever and unexpected weight change.  HENT: Positive for congestion and sneezing. Negative for dental problem, ear pain, nosebleeds, postnasal drip, rhinorrhea, sinus pressure, sore throat and trouble swallowing.   Eyes: Negative for redness and itching.  Respiratory: Positive for shortness of breath. Negative for cough, chest tightness and wheezing.   Cardiovascular: Positive for chest pain and palpitations. Negative for leg swelling.  Gastrointestinal: Negative for nausea and vomiting.  Genitourinary: Negative for dysuria.  Musculoskeletal: Negative for joint swelling.  Skin: Negative for rash.  Neurological: Positive  for headaches.  Hematological: Does not bruise/bleed easily.  Psychiatric/Behavioral: Negative for dysphoric mood. The patient is nervous/anxious.     Past Medical History  Diagnosis Date  . Candidiasis of the esophagus 11/26/2007  . Myocardial infarct, old   . COPD, severe   . Sick sinus syndrome with tachycardia ([place])     MDT  . Pacemaker     MDT  . Chronic kidney disease (CKD), stage III (moderate)   . Hypertension   . Acute appendicitis with rupture   . Spinal stenosis, lumbar   . Adrenal adenoma     Incidentaloma  . Lumbar herniated disc     History of HNP L4/5 in 2003  . Hx of colonoscopy with polypectomy 04/27/2010    Dr Cristina Gong found three  tubular adenomas each less than 10 mm size  . Retinal hemorrhage of left eye 06/2010  . Cataract 2013    Bilateral   . Abnormal mammogram, unspecified 08/23/2010    Followup imaging reassuring.   Marland Kitchen DISC WITH RADICULOPATHY 04/27/2006    Qualifier: Diagnosis of  By: McDiarmid MD, Sherren Mocha    . Transitional cell carcinoma of ureter, history   . Mild cognitive impairment 10/26/2012    (10/25/12) Failed MiniCog screen  . 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 Normal uric acid serum level at time of attack.    Marland Kitchen EDEMA-LEGS,DUE TO VENOUS OBSTRUCT. 04/27/2006    Qualifier: Diagnosis of  By: McDiarmid MD, Sherren Mocha    . ANXIETY 04/27/2006    Qualifier: Diagnosis of  By: McDiarmid MD, Sherren Mocha    . HERNIA, HIATAL, NONCONGENITAL 04/27/2006    Qualifier: Diagnosis of  By: McDiarmid MD, Sherren Mocha    . History of Hemorrhoids 04/27/2006    Qualifier: Diagnosis of  By: McDiarmid MD, Sherren Mocha    . RHINITIS, ALLERGIC 04/27/2006    Qualifier: Diagnosis of  By: McDiarmid MD, Sherren Mocha    . SCHATZKI'S RING, HX OF 11/26/2007    Qualifier: Diagnosis of  By: McDiarmid MD, Sherren Mocha  An EGD was performed by Dr Cristina Gong on 04/27/2010 for iron deficiency anemia. There was a a transient hiatal hernia with Schatzki's  ring. Stomach and duodenum were normal. EGD on 03/06/12 by Dr Cristina Gong for IDA non-obstructing Schatzki's ring at Gastroesophageal junction, otherwise normal esophagus and stomach.    . Urge incontinence 12/13/2011    Diagnosed in 10/2011 by Dr Bjorn Loser (Urology)   . VITAMIN B12 DEFICIENCY 10/07/2009    Qualifier: Diagnosis of  By: McDiarmid MD, Sherren Mocha  Dx based on a post-TKR anemia work-up Low normal serum B12 with high Methylmalonic acid and homocysteine level  Vit B12 serum level (10/28/10) > 1500 pg/mL   . Vitamin D deficiency 11/02/2010    Serum vitamin D 25(OH) = 10.9 ng/mL (30 -100) on 10/28/10 c/w Vitamin D deficiency.     . AF (paroxysmal atrial fibrillation) ([place]) 11/11/2010    Hospitalization (9/8-9/10, Dr Daneen Schick, III, Cardiology) for Paroxysmal Atrial Fibrillation with RVR and anginal pain secondary to demand/supply mismatch in setting of RVR with known circumflex artery branch disease.    . Iron deficiency anemia 08/05/2010    Dr Cristina Gong (GI) has evaluated with EGD, colonoscopy, and video capsular endoscopy in 2011 & 2012.  All have been unrevealing as to an origin of IDA.  OV with Dr Cristina Gong (10/28/10) assessment of blood in stool per hemoccult and GER. Hbg 12.1 g/dL, MCV 91.8, Ferritin 30 ng/mL. Patient taking on ferrous sulfate tab daily.   EGD on 03/06/12 by Dr Cristina Gong for IDA non-obstructing Schatzki's ring at Gastroesophageal junction, otherwise normal esophagus and stomach.     . Macular degeneration, bilateral 10/04/2010    Right eye is wet MD, the other is dry macular degeneration (ARMD). Pt undergoing some form of vascular endothelial growth factor inhibition intraocular therapy.    . VENTRICULAR HYPERTROPHY, LEFT 08/28/2008    Qualifier: Diagnosis of  By: McDiarmid MD, Sherren Mocha    . COPD 04/27/2006    Qualifier: Diagnosis of  By: McDiarmid MD, Sherren Mocha    . Solitary kidney, acquired 05/17/2010    Surgical removal for transitional cell cancer by Tresa Endo, MD ([facility]). Surveillance  cystoscopy by Dr Alinda Money [facility]) on 10/19/12 without evidence of cystoscopic recurrence. Recommend RTC one year for cystoscopy.   . ADENOMATOUS COLONIC POLYP 03/01/2003    Qualifier: Diagnosis of  By: McDiarmid MD, Sherren Mocha Multiple benign polyps of cecum, ascending, transverse and sigmoid colon by 8/09 colonoscopy by Dr Cristina Gong  Colonoscopy by Dr Cristina Gong for iron-deficiency anemia on 04/27/2010 showed three sessile polyps that were in ascending (3 mm x 9 mm), transverse (4 mm), and cecum (3 mm).  All three were tubular adenomas that were negative for high grade dysplasia or malignancy on pathology. Dr Cristina Gong called the polpys benign and not requiring follow-up in view of the patients age.    . Soft tissue injury of foot 05/03/2011  . PREDIABETES 09/11/2007    Qualifier: Diagnosis of  By: McDiarmid MD, Sherren Mocha    . Numbness and tingling in hands 07/21/2011  . MUSCLE CRAMPS 03/11/2010    Qualifier: Diagnosis of  By: McDiarmid MD, Sherren Mocha    . Leg cramps 05/29/2012  . Cellulitis of leg, right 10/01/2012  . Solar lentigo 06/15/2012  . Muscle spasm of back 09/24/2013  . Shoulder pain, left 01/09/2014  . Seborrheic keratosis, right anterior thigh 12/12/2013  . High risk medications (not anticoagulants) long-term use 03/05/2012  . CAP (community acquired pneumonia) 02/25/2015  . Decreased functional mobility and endurance 03/17/2015     Family History  Problem Relation Age of Onset  . Dementia Mother   . Heart disease Father      Social History   Social History  . Marital Status: Widowed    Spouse Name: N/A  . Number of Children: 4  . Years of Education: N/A   Occupational History  . Futures trader     Retired   Social History Main Topics  . Smoking status: Former Smoker -- 1.00 packs/day    Types: Cigarettes    Quit date: 03/02/2009  . Smokeless tobacco: Never Used  . Alcohol Use: 12.0 oz/week    14 Glasses of wine, 6 Standard drinks or equivalent per week     Comment: 4-5 glasses of wine  per week  . Drug Use: No  . Sexual Activity: No   Other Topics Concern  . 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    Semi-retired Futures trader.     Pt has home nebulizer for inhalation therapies.   Former Smoker   Smoking Status:  quit > 5 years ago      (+) DNR status per discussion with Dr McDiarmid 10/25/12 and reiterated 06/20/13 office visit .   (+) Living Will/Advance Directive   (+) HC-POA: Cecile Sheerer Causey (pt's dgt)     Allergies  Allergen Reactions  . Baclofen Other (See Comments)    Confusion occurred with taking 3 at the same time.  . Codeine Phosphate Nausea Only  . Norco [Hydrocodone-Acetaminophen] Nausea And Vomiting  . Hydrochlorothiazide Other (See Comments)    Possible acute gout or arthritis of wrist  . Montelukast Sodium Other (See Comments)    Unspecified reaction.      Outpatient Prescriptions Prior to Visit  Medication Sig Dispense Refill  . albuterol (VENTOLIN HFA) 108 (90 BASE) MCG/ACT inhaler Inhale 2 puffs into the lungs every 6 (six) hours as needed. If needed for shortness of breath. 1 Inhaler 3  . amiodarone (PACERONE) 200 MG tablet Take 1 tablet (200 mg total) by mouth daily. 90 tablet 3  . apixaban (ELIQUIS) 2.5 MG TABS tablet Take 1 tablet (2.5 mg total) by mouth 2 (two) times daily. 60 tablet 5  . atorvastatin (LIPITOR) 20 MG tablet Take 1 tablet (20 mg total) by mouth daily. 30 tablet 11  . bacitracin ointment Apply topically daily. 120 g 0  . cholecalciferol (VITAMIN D) 400 units TABS tablet Take 400 Units by mouth daily.    . diclofenac sodium (VOLTAREN) 1 % GEL Apply 2 g topically 4 (four) times daily. 100 g 0  . docusate sodium (COLACE) 100 MG capsule Take 100 mg by mouth daily as needed for mild constipation. Reported on 07/08/2015    .  Fluticasone-Salmeterol (ADVAIR DISKUS) 250-50 MCG/DOSE AEPB Inhale 2 puffs into the lungs 2 (two) times daily. 60 each 11  . furosemide (LASIX) 40 MG tablet Take 0.5 tablets (20 mg total) by mouth daily. 30 tablet 1  . LORazepam (ATIVAN) 0.5 MG tablet Take 0.5 mg by mouth every morning.    . Melatonin 3 MG TABS Take 3 mg by mouth at bedtime as needed (sleep).    . metoprolol succinate (TOPROL-XL) 50 MG 24 hr tablet Take 25 mg by mouth daily.    . nitroGLYCERIN (NITROSTAT) 0.4 MG SL tablet Place 0.4 mg under the tongue every 5 (five) minutes as needed for chest pain (x 3 tabs).    Marland Kitchen omeprazole (PRILOSEC) 20 MG capsule Take 1 capsule (20 mg total) by mouth daily. 90 capsule 3  . polyethylene glycol (MIRALAX / GLYCOLAX) packet Take 17 g by mouth 2 (two) times daily. 14 each 0  . potassium chloride (K-DUR,KLOR-CON) 10 MEQ tablet Take 1 tablet (10 mEq total) by mouth daily. 90 tablet 3  . tiotropium (SPIRIVA) 18 MCG inhalation capsule Place 1 capsule (18 mcg total) into inhaler and inhale daily. 30 capsule PRN  . traMADol (ULTRAM) 50 MG tablet Take 1 tablet (50 mg total) by mouth every 6 (six) hours as needed for moderate pain. 30 tablet 1   No facility-administered medications prior to visit.         Objective:   Physical Exam  Filed Vitals:   07/14/15 0901 07/14/15 0902  BP:  102/62  Pulse:  74  Height: 5' 4.5" (1.638 m)   Weight: 161 lb (73.029 kg)   SpO2:  97%   Gen: Pleasant, elderly woman in no distress,  normal affect  ENT: No lesions,  mouth clear,  oropharynx clear, no postnasal drip  Neck: No JVD, no stridor  Lungs: No use of accessory muscles, somewhat distant, very mild soft wheezes left greater than right, mostly clear  Cardiovascular: Irregular, heart sounds normal, no murmur or gallops, no peripheral edema  Musculoskeletal: No deformities, no cyanosis or clubbing  Neuro: alert, non focal  Skin: Warm, no lesions or rashes      Assessment & Plan:  COPD, severe  (HCC) Stable severe COPD. I believe that her recent episodes, variability and function have been related to her cardiac status. She remains hypotensive after decrease in her metoprolol last week, has cool extremities today. Walking oximetry today to establish when/if she needs to wear her supplement oxygen. I've asked her to use it any time and with any exertion that would cause her to go below 90%  Please continue Spiriva daily and Advair twice a day Keep albuterol available to use as needed OXIMETRY today on room air.  Follow with Dr Lamonte Sakai in 4 months or sooner if you have any problems.

## 2015-07-15 ENCOUNTER — Telehealth: Payer: Self-pay | Admitting: Emergency Medicine

## 2015-07-15 ENCOUNTER — Telehealth: Payer: Self-pay | Admitting: *Deleted

## 2015-07-15 DIAGNOSIS — J441 Chronic obstructive pulmonary disease with (acute) exacerbation: Secondary | ICD-10-CM | POA: Diagnosis not present

## 2015-07-15 DIAGNOSIS — I2510 Atherosclerotic heart disease of native coronary artery without angina pectoris: Secondary | ICD-10-CM | POA: Diagnosis not present

## 2015-07-15 DIAGNOSIS — I129 Hypertensive chronic kidney disease with stage 1 through stage 4 chronic kidney disease, or unspecified chronic kidney disease: Secondary | ICD-10-CM | POA: Diagnosis not present

## 2015-07-15 DIAGNOSIS — M4806 Spinal stenosis, lumbar region: Secondary | ICD-10-CM | POA: Diagnosis not present

## 2015-07-15 DIAGNOSIS — N183 Chronic kidney disease, stage 3 (moderate): Secondary | ICD-10-CM | POA: Diagnosis not present

## 2015-07-15 DIAGNOSIS — I4891 Unspecified atrial fibrillation: Secondary | ICD-10-CM | POA: Diagnosis not present

## 2015-07-15 NOTE — Telephone Encounter (Signed)
Herbert Deaner Physical Therapist with [facility] called to request verbal orders for physical therapy 2 times a week for 1 wk and 1 time a week for 1 week. Please give him a call at [phone].  Derl Barrow, RN

## 2015-07-15 NOTE — Telephone Encounter (Signed)
Last 07/14/15 with BQ  Assessment & Plan:  COPD, severe (HCC) Stable severe COPD. I believe that her recent episodes, variability and function have been related to her cardiac status. She remains hypotensive after decrease in her metoprolol last week, has cool extremities today. Walking oximetry today to establish when/if she needs to wear her supplement oxygen. I've asked her to use it any time and with any exertion that would cause her to go below 90%       Spoke with Clair Gulling at [facility] physical therapy. He states he is calling to get clarification on when the patient is to use her oxygen. I reviewed BQ's note from the ov. He voiced understanding and had no further questions. Nothing further needed.

## 2015-07-16 DIAGNOSIS — J441 Chronic obstructive pulmonary disease with (acute) exacerbation: Secondary | ICD-10-CM | POA: Diagnosis not present

## 2015-07-16 DIAGNOSIS — N183 Chronic kidney disease, stage 3 (moderate): Secondary | ICD-10-CM | POA: Diagnosis not present

## 2015-07-16 DIAGNOSIS — I2510 Atherosclerotic heart disease of native coronary artery without angina pectoris: Secondary | ICD-10-CM | POA: Diagnosis not present

## 2015-07-16 DIAGNOSIS — I4891 Unspecified atrial fibrillation: Secondary | ICD-10-CM | POA: Diagnosis not present

## 2015-07-16 DIAGNOSIS — M4806 Spinal stenosis, lumbar region: Secondary | ICD-10-CM | POA: Diagnosis not present

## 2015-07-16 DIAGNOSIS — H353211 Exudative age-related macular degeneration, right eye, with active choroidal neovascularization: Secondary | ICD-10-CM | POA: Diagnosis not present

## 2015-07-16 DIAGNOSIS — I129 Hypertensive chronic kidney disease with stage 1 through stage 4 chronic kidney disease, or unspecified chronic kidney disease: Secondary | ICD-10-CM | POA: Diagnosis not present

## 2015-07-16 NOTE — Telephone Encounter (Signed)
Pls call in the orders below  Thanks  [place]

## 2015-07-16 NOTE — Telephone Encounter (Signed)
Spoke with Ebony Hail with [facility] and verbal orders given. Jazmin Hartsell,CMA

## 2015-07-16 NOTE — Telephone Encounter (Signed)
Verbal orders given. Page, cma.

## 2015-07-16 NOTE — Telephone Encounter (Signed)
Please give the order. Not sure who to though.  If you don't know can likely wait till Dr Jerilynn Mages is back  tahnks

## 2015-07-20 DIAGNOSIS — I4891 Unspecified atrial fibrillation: Secondary | ICD-10-CM | POA: Diagnosis not present

## 2015-07-20 DIAGNOSIS — I129 Hypertensive chronic kidney disease with stage 1 through stage 4 chronic kidney disease, or unspecified chronic kidney disease: Secondary | ICD-10-CM | POA: Diagnosis not present

## 2015-07-20 DIAGNOSIS — J441 Chronic obstructive pulmonary disease with (acute) exacerbation: Secondary | ICD-10-CM | POA: Diagnosis not present

## 2015-07-20 DIAGNOSIS — M4806 Spinal stenosis, lumbar region: Secondary | ICD-10-CM | POA: Diagnosis not present

## 2015-07-20 DIAGNOSIS — I2510 Atherosclerotic heart disease of native coronary artery without angina pectoris: Secondary | ICD-10-CM | POA: Diagnosis not present

## 2015-07-20 DIAGNOSIS — N183 Chronic kidney disease, stage 3 (moderate): Secondary | ICD-10-CM | POA: Diagnosis not present

## 2015-07-21 ENCOUNTER — Telehealth: Payer: Self-pay | Admitting: Interventional Cardiology

## 2015-07-21 DIAGNOSIS — J441 Chronic obstructive pulmonary disease with (acute) exacerbation: Secondary | ICD-10-CM | POA: Diagnosis not present

## 2015-07-21 DIAGNOSIS — N183 Chronic kidney disease, stage 3 (moderate): Secondary | ICD-10-CM | POA: Diagnosis not present

## 2015-07-21 DIAGNOSIS — I4891 Unspecified atrial fibrillation: Secondary | ICD-10-CM | POA: Diagnosis not present

## 2015-07-21 DIAGNOSIS — H353221 Exudative age-related macular degeneration, left eye, with active choroidal neovascularization: Secondary | ICD-10-CM | POA: Diagnosis not present

## 2015-07-21 DIAGNOSIS — I2510 Atherosclerotic heart disease of native coronary artery without angina pectoris: Secondary | ICD-10-CM | POA: Diagnosis not present

## 2015-07-21 DIAGNOSIS — M4806 Spinal stenosis, lumbar region: Secondary | ICD-10-CM | POA: Diagnosis not present

## 2015-07-21 DIAGNOSIS — I129 Hypertensive chronic kidney disease with stage 1 through stage 4 chronic kidney disease, or unspecified chronic kidney disease: Secondary | ICD-10-CM | POA: Diagnosis not present

## 2015-07-21 NOTE — Telephone Encounter (Signed)
Spoke with Margaretha Sheffield from [facility]. She confirms the patient is taking metoprolol 25 mg once daily and lasix 20 mg once daily. This was decreased on 07/08/15 when she saw Rosaria Ferries, PA. Per Margaretha Sheffield, the patient is having no symptoms of dizziness/ lightheadedness. I advised her I would forward to Dr. [name] to review and we will call back with any further recommendations.

## 2015-07-21 NOTE — Telephone Encounter (Signed)
New message      Pt c/o BP issue: STAT if pt c/o blurred vision, one-sided weakness or slurred speech  1. What are your last 5 BP readings? This am  91/46 HR 66; lunch time today 106-62 sitting and 102/60 HR 62 standing 2. Are you having any other symptoms (ex. Dizziness, headache, blurred vision, passed out)? no  3. What is your BP issue? Calling to give bp readings

## 2015-07-21 NOTE — Telephone Encounter (Signed)
Continue same therapy.

## 2015-07-22 NOTE — Telephone Encounter (Signed)
Pt and Patients daughter [name] aware of Dr.Smith's response below. [name] verbalized understanding

## 2015-07-23 DIAGNOSIS — M4806 Spinal stenosis, lumbar region: Secondary | ICD-10-CM | POA: Diagnosis not present

## 2015-07-23 DIAGNOSIS — J441 Chronic obstructive pulmonary disease with (acute) exacerbation: Secondary | ICD-10-CM | POA: Diagnosis not present

## 2015-07-23 DIAGNOSIS — I4891 Unspecified atrial fibrillation: Secondary | ICD-10-CM | POA: Diagnosis not present

## 2015-07-23 DIAGNOSIS — I129 Hypertensive chronic kidney disease with stage 1 through stage 4 chronic kidney disease, or unspecified chronic kidney disease: Secondary | ICD-10-CM | POA: Diagnosis not present

## 2015-07-23 DIAGNOSIS — I2510 Atherosclerotic heart disease of native coronary artery without angina pectoris: Secondary | ICD-10-CM | POA: Diagnosis not present

## 2015-07-23 DIAGNOSIS — N183 Chronic kidney disease, stage 3 (moderate): Secondary | ICD-10-CM | POA: Diagnosis not present

## 2015-07-28 DIAGNOSIS — N183 Chronic kidney disease, stage 3 (moderate): Secondary | ICD-10-CM | POA: Diagnosis not present

## 2015-07-28 DIAGNOSIS — I4891 Unspecified atrial fibrillation: Secondary | ICD-10-CM | POA: Diagnosis not present

## 2015-07-28 DIAGNOSIS — I2510 Atherosclerotic heart disease of native coronary artery without angina pectoris: Secondary | ICD-10-CM | POA: Diagnosis not present

## 2015-07-28 DIAGNOSIS — J441 Chronic obstructive pulmonary disease with (acute) exacerbation: Secondary | ICD-10-CM | POA: Diagnosis not present

## 2015-07-28 DIAGNOSIS — I129 Hypertensive chronic kidney disease with stage 1 through stage 4 chronic kidney disease, or unspecified chronic kidney disease: Secondary | ICD-10-CM | POA: Diagnosis not present

## 2015-07-28 DIAGNOSIS — M4806 Spinal stenosis, lumbar region: Secondary | ICD-10-CM | POA: Diagnosis not present

## 2015-07-29 ENCOUNTER — Telehealth: Payer: Self-pay | Admitting: *Deleted

## 2015-07-29 DIAGNOSIS — J441 Chronic obstructive pulmonary disease with (acute) exacerbation: Secondary | ICD-10-CM | POA: Diagnosis not present

## 2015-07-29 DIAGNOSIS — M4806 Spinal stenosis, lumbar region: Secondary | ICD-10-CM | POA: Diagnosis not present

## 2015-07-29 DIAGNOSIS — I4891 Unspecified atrial fibrillation: Secondary | ICD-10-CM | POA: Diagnosis not present

## 2015-07-29 DIAGNOSIS — I2510 Atherosclerotic heart disease of native coronary artery without angina pectoris: Secondary | ICD-10-CM | POA: Diagnosis not present

## 2015-07-29 DIAGNOSIS — I129 Hypertensive chronic kidney disease with stage 1 through stage 4 chronic kidney disease, or unspecified chronic kidney disease: Secondary | ICD-10-CM | POA: Diagnosis not present

## 2015-07-29 DIAGNOSIS — N183 Chronic kidney disease, stage 3 (moderate): Secondary | ICD-10-CM | POA: Diagnosis not present

## 2015-07-29 NOTE — Telephone Encounter (Signed)
Herbert Deaner, Physical Therapist from [facility] called stating patient will be discharged from physical therapy today.  Patient also reported daily headaches from severe with minimal relief.  Patient has an appointment tomorrow July 30, 2015 at 11:00 AM with PCP.  Derl Barrow, RN

## 2015-07-30 ENCOUNTER — Encounter: Payer: Self-pay | Admitting: Family Medicine

## 2015-07-30 ENCOUNTER — Ambulatory Visit (INDEPENDENT_AMBULATORY_CARE_PROVIDER_SITE_OTHER): Payer: Medicare Other | Admitting: Family Medicine

## 2015-07-30 VITALS — BP 111/57 | HR 71 | Temp 98.5°F | Wt 163.0 lb

## 2015-07-30 DIAGNOSIS — I10 Essential (primary) hypertension: Secondary | ICD-10-CM

## 2015-07-30 DIAGNOSIS — N184 Chronic kidney disease, stage 4 (severe): Secondary | ICD-10-CM | POA: Diagnosis not present

## 2015-07-30 DIAGNOSIS — I4891 Unspecified atrial fibrillation: Secondary | ICD-10-CM | POA: Diagnosis not present

## 2015-07-30 DIAGNOSIS — J449 Chronic obstructive pulmonary disease, unspecified: Secondary | ICD-10-CM

## 2015-07-30 DIAGNOSIS — I25119 Atherosclerotic heart disease of native coronary artery with unspecified angina pectoris: Secondary | ICD-10-CM | POA: Diagnosis not present

## 2015-07-30 DIAGNOSIS — I2510 Atherosclerotic heart disease of native coronary artery without angina pectoris: Secondary | ICD-10-CM | POA: Diagnosis not present

## 2015-07-30 DIAGNOSIS — I129 Hypertensive chronic kidney disease with stage 1 through stage 4 chronic kidney disease, or unspecified chronic kidney disease: Secondary | ICD-10-CM | POA: Diagnosis not present

## 2015-07-30 DIAGNOSIS — R0602 Shortness of breath: Secondary | ICD-10-CM

## 2015-07-30 DIAGNOSIS — J441 Chronic obstructive pulmonary disease with (acute) exacerbation: Secondary | ICD-10-CM | POA: Diagnosis not present

## 2015-07-30 DIAGNOSIS — M4806 Spinal stenosis, lumbar region: Secondary | ICD-10-CM | POA: Diagnosis not present

## 2015-07-30 DIAGNOSIS — I480 Paroxysmal atrial fibrillation: Secondary | ICD-10-CM | POA: Diagnosis not present

## 2015-07-30 DIAGNOSIS — I5030 Unspecified diastolic (congestive) heart failure: Secondary | ICD-10-CM | POA: Diagnosis not present

## 2015-07-30 DIAGNOSIS — N183 Chronic kidney disease, stage 3 (moderate): Secondary | ICD-10-CM | POA: Diagnosis not present

## 2015-07-30 HISTORY — DX: Unspecified diastolic (congestive) heart failure: I50.30

## 2015-07-30 LAB — CBC
HEMATOCRIT: 32 % — AB (ref 35.0–45.0)
HEMOGLOBIN: 10.3 g/dL — AB (ref 11.7–15.5)
MCH: 30 pg (ref 27.0–33.0)
MCHC: 32.2 g/dL (ref 32.0–36.0)
MCV: 93.3 fL (ref 80.0–100.0)
MPV: 10 fL (ref 7.5–12.5)
Platelets: 265 10*3/uL (ref 140–400)
RBC: 3.43 MIL/uL — AB (ref 3.80–5.10)
RDW: 14.9 % (ref 11.0–15.0)
WBC: 5.5 10*3/uL (ref 3.8–10.8)

## 2015-07-30 NOTE — Telephone Encounter (Signed)
reviewed

## 2015-07-31 ENCOUNTER — Encounter: Payer: Self-pay | Admitting: Family Medicine

## 2015-07-31 LAB — LIPID PANEL
CHOL/HDL RATIO: 3.4 ratio (ref <=5.0)
Cholesterol: 140 mg/dL (ref 125–200)
HDL: 41 mg/dL — ABNORMAL LOW (ref >=46)
LDL CALC: 70 mg/dL (ref <130)
Triglycerides: 143 mg/dL (ref <150)
VLDL: 29 mg/dL (ref <30)

## 2015-07-31 LAB — BASIC METABOLIC PANEL
BUN: 28 mg/dL — ABNORMAL HIGH (ref 7–25)
CHLORIDE: 102 mmol/L (ref 98–110)
CO2: 26 mmol/L (ref 20–31)
Calcium: 8.7 mg/dL (ref 8.6–10.4)
Creat: 1.61 mg/dL — ABNORMAL HIGH (ref 0.60–0.88)
Glucose, Bld: 92 mg/dL (ref 65–99)
POTASSIUM: 4.6 mmol/L (ref 3.5–5.3)
Sodium: 138 mmol/L (ref 135–146)

## 2015-07-31 LAB — BRAIN NATRIURETIC PEPTIDE: BRAIN NATRIURETIC PEPTIDE: 335.4 pg/mL — AB (ref <100)

## 2015-07-31 NOTE — Progress Notes (Signed)
   Subjective:    Patient ID: [name], female    DOB: 09-09-1923, 82 y.o.   MRN: [id number]  [facility] Follow up  [facility] Pt admitted 4/15-4/17/17 on FMTS for chest dicomfort and SOB . Dx with AE-COPD treat with Abx and steroid burst with good results.  Pt sen by Cardiolgoy in hospital follow up. Pt had increase peripheral edema, increased SOB, (+) PND , wt gain of 7 lbs, increase BP.  Lasix increase to 40 mg daily for 3 days then changed to 20 mg daily.  On cardiology follow up  Her dyspnea was decreased, wher edema was improved and her weigh was down 3 pounds. Pt potassium was changed to 10 meq per day and metoprolol decreasedfrom 50 mg to 25 md daily.   [name] continues to live with her [name].   COPD Stable.  No need to increase albuterol use  Afib Stable CBruising but no bleeding on eliquis  Anemia - iron deficient - no palpitations, lightheadedness - no longer taking daily iron tablet  Hypercholesterolemia -taking the atorvastatin - no RUQ pain, no myalgias  No smoking Review of Systems See HPI Occasional headaches No limb weakness Walking without difficulty No urinary incontinence    Objective:   Physical Exam VS reviewed Reviewed nursing note findings with patient  GEN: Alert, Cooperative, Groomed, NAD COR: RRR, No M/G/R, No JVD, Normal PMI size and location LUNGS: BCTA, No Acc mm use, speaking in full sentences EXT: No peripheral leg edema.Palpable bilateral pedal pulses.  SKIN: No lesion nor rashes of face/trunk/extremities Neuro: Oriented to person, place, and time; Strength: 5/5 Bil. UE and LE symmetric; Sensation: Intact grossly to touch all four extremities; Cerebellar: Finger-to-Nose intact, Rhomberg negative DTR: Bilateral Bicep 2+, Bilateral Triceps 2+, Bilateral Knees 2+, Bilateral Ankles 1+ Gait: slightly slowed speed, No significant path deviation, Step through +,  Psych: Normal affect/thought/speech/language.  Loses track of her  stories, looking to [name], to help remember details of stories she wants to tell.         Assessment & Plan:

## 2015-07-31 NOTE — Assessment & Plan Note (Signed)
Established problem Controlled Currently in regular rhythm with heart rate between 60 to 100 bpm on lower dose of metoprolol succ 25 mg daily Taking Eliquis

## 2015-07-31 NOTE — Assessment & Plan Note (Addendum)
Established problem. Stable. Continue current therapy Faxing note to [facility] to remove oxygen from patient's home as her rest and exertional oxygen checks by PT and her dgt are well above 90% at rest and with exertion.

## 2015-07-31 NOTE — Assessment & Plan Note (Signed)
Established problem. Stable. Continue current therapy Checking BNP for baseline level when dry

## 2015-07-31 NOTE — Assessment & Plan Note (Signed)
Established problem. Stable. Will check BMP to monitor for progression

## 2015-08-01 IMAGING — CR DG CHEST 1V PORT
1 series · 1 of 1 positions shown · non-contrast
Comparison: Chest radiograph performed 05/01/2014

CLINICAL DATA: Acute onset of chest pain, shortness of breath and
nausea. Initial encounter.

EXAM:
PORTABLE CHEST - 1 VIEW

[AP]
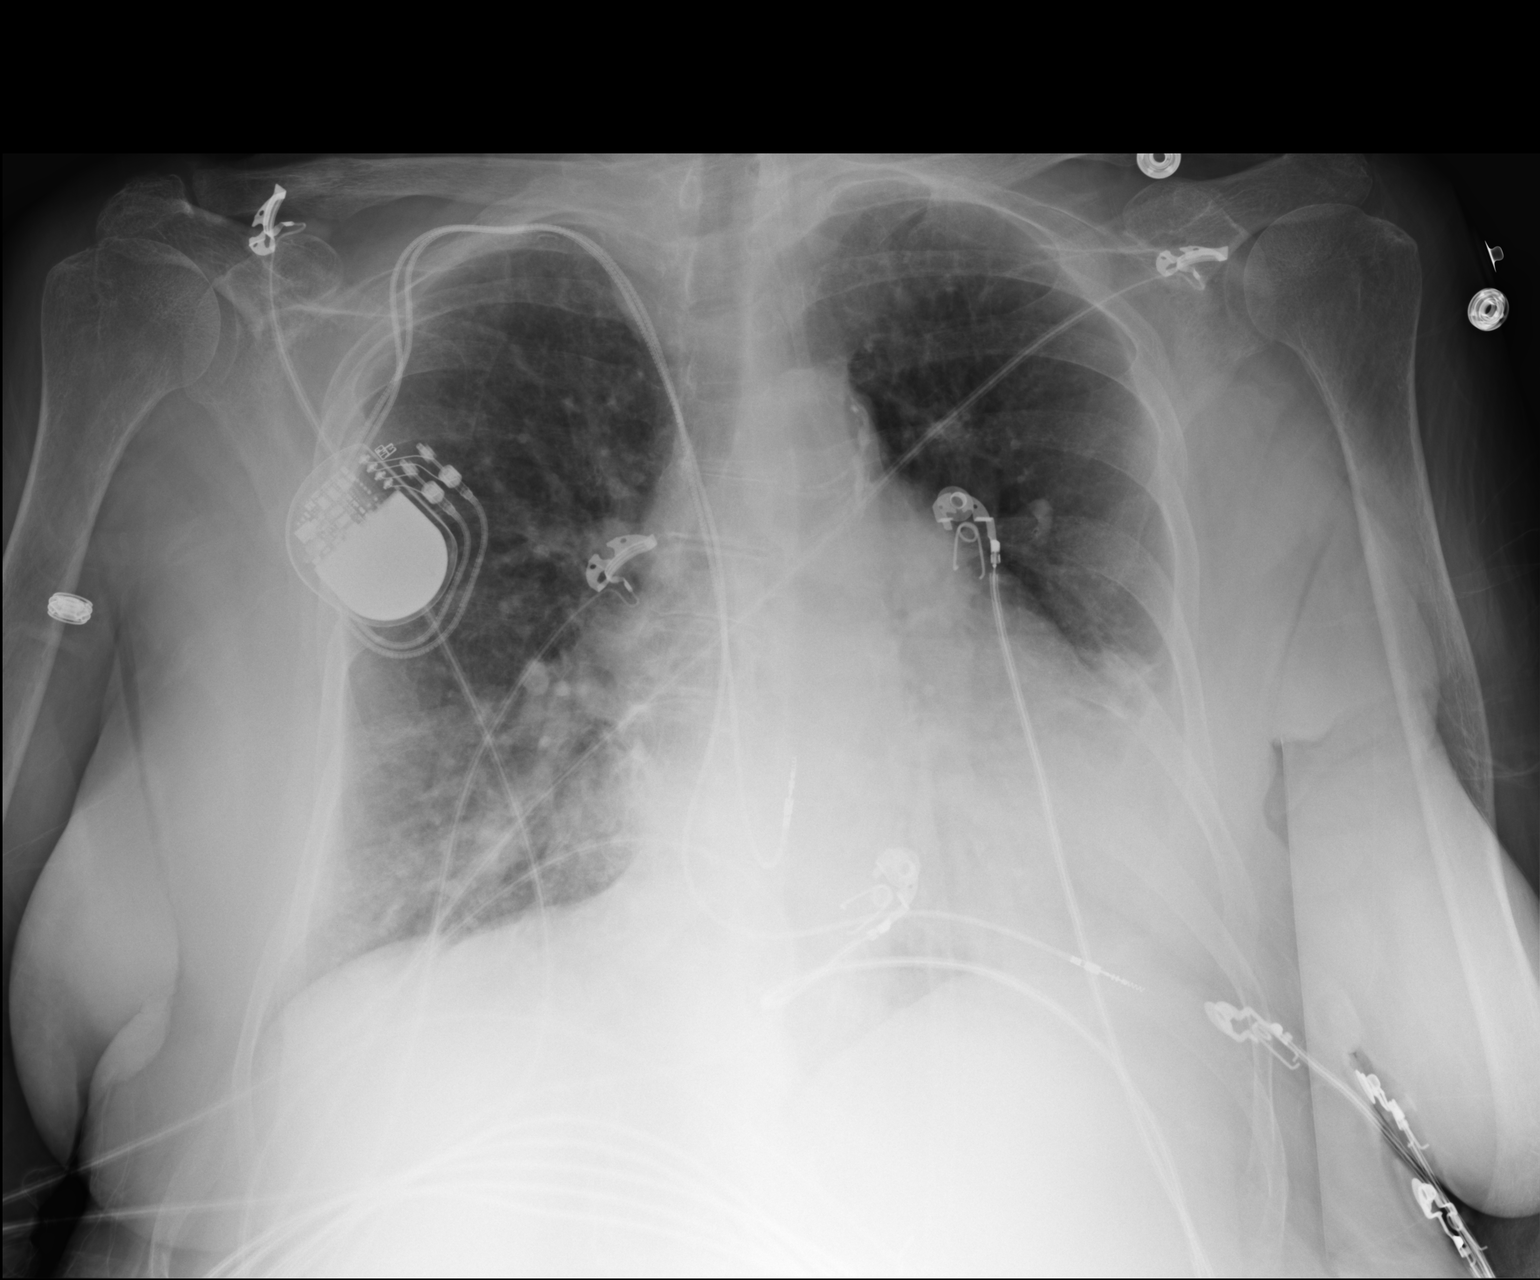

[1 of 1 positions shown; findings below may reference images not displayed]

FINDINGS: The lungs are well-aerated. Left basilar airspace opacity raises
concern for pneumonia, though asymmetric interstitial edema might
have a similar appearance. A small left pleural effusion is
suspected. Mild right basilar airspace opacity is seen. No
pneumothorax is identified.

The cardiomediastinal silhouette is mildly enlarged. A pacemaker is
noted overlying the right chest wall, with leads ending overlying
the right atrium and right ventricle. No acute osseous abnormalities
are seen.
IMPRESSION: Left basilar airspace opacity raises concern for pneumonia, though
asymmetric interstitial edema might have a similar appearance. Small
left pleural effusion suspected. Mild right basilar airspace opacity
seen. Mild cardiomegaly noted.

## 2015-08-03 ENCOUNTER — Encounter: Payer: Self-pay | Admitting: Family Medicine

## 2015-08-03 ENCOUNTER — Telehealth: Payer: Self-pay | Admitting: Family Medicine

## 2015-08-03 NOTE — Telephone Encounter (Signed)
Discussed results with Mrs Landsverk and [name] (Dgt).  Reassuring labs.  Stable Cr, Hgb and reduction in BNP on Lasix 20 mg daily.

## 2015-08-05 DIAGNOSIS — J441 Chronic obstructive pulmonary disease with (acute) exacerbation: Secondary | ICD-10-CM | POA: Diagnosis not present

## 2015-08-05 DIAGNOSIS — M4806 Spinal stenosis, lumbar region: Secondary | ICD-10-CM | POA: Diagnosis not present

## 2015-08-05 DIAGNOSIS — I129 Hypertensive chronic kidney disease with stage 1 through stage 4 chronic kidney disease, or unspecified chronic kidney disease: Secondary | ICD-10-CM | POA: Diagnosis not present

## 2015-08-05 DIAGNOSIS — I2510 Atherosclerotic heart disease of native coronary artery without angina pectoris: Secondary | ICD-10-CM | POA: Diagnosis not present

## 2015-08-05 DIAGNOSIS — N183 Chronic kidney disease, stage 3 (moderate): Secondary | ICD-10-CM | POA: Diagnosis not present

## 2015-08-05 DIAGNOSIS — I4891 Unspecified atrial fibrillation: Secondary | ICD-10-CM | POA: Diagnosis not present

## 2015-08-11 DIAGNOSIS — I2510 Atherosclerotic heart disease of native coronary artery without angina pectoris: Secondary | ICD-10-CM | POA: Diagnosis not present

## 2015-08-11 DIAGNOSIS — N183 Chronic kidney disease, stage 3 (moderate): Secondary | ICD-10-CM | POA: Diagnosis not present

## 2015-08-11 DIAGNOSIS — I129 Hypertensive chronic kidney disease with stage 1 through stage 4 chronic kidney disease, or unspecified chronic kidney disease: Secondary | ICD-10-CM | POA: Diagnosis not present

## 2015-08-11 DIAGNOSIS — M4806 Spinal stenosis, lumbar region: Secondary | ICD-10-CM | POA: Diagnosis not present

## 2015-08-11 DIAGNOSIS — I4891 Unspecified atrial fibrillation: Secondary | ICD-10-CM | POA: Diagnosis not present

## 2015-08-11 DIAGNOSIS — J441 Chronic obstructive pulmonary disease with (acute) exacerbation: Secondary | ICD-10-CM | POA: Diagnosis not present

## 2015-09-03 ENCOUNTER — Ambulatory Visit (INDEPENDENT_AMBULATORY_CARE_PROVIDER_SITE_OTHER): Payer: Medicare Other | Admitting: Family Medicine

## 2015-09-03 ENCOUNTER — Encounter: Payer: Self-pay | Admitting: Family Medicine

## 2015-09-03 VITALS — BP 135/68 | HR 97 | Temp 98.2°F | Ht 65.0 in | Wt 164.0 lb

## 2015-09-03 DIAGNOSIS — I5030 Unspecified diastolic (congestive) heart failure: Secondary | ICD-10-CM

## 2015-09-03 DIAGNOSIS — I480 Paroxysmal atrial fibrillation: Secondary | ICD-10-CM

## 2015-09-03 DIAGNOSIS — J449 Chronic obstructive pulmonary disease, unspecified: Secondary | ICD-10-CM

## 2015-09-03 DIAGNOSIS — N184 Chronic kidney disease, stage 4 (severe): Secondary | ICD-10-CM | POA: Diagnosis not present

## 2015-09-03 DIAGNOSIS — I25119 Atherosclerotic heart disease of native coronary artery with unspecified angina pectoris: Secondary | ICD-10-CM | POA: Diagnosis not present

## 2015-09-03 NOTE — Progress Notes (Signed)
Subjective:    Patient ID: [name], female    DOB: August 26, 1923, 82 y.o.   MRN: [id number]  HPI Subjective:   Heart Failure  [name] is a 82 y.o. female who presents for follow-up of congestive heart failure. Current symptoms include: none. She denies chest pain, dyspnea, fatigue, lower extremity edema and near-syncope. She states she is compliant most of the time with her medications. She states she is compliant most of the time with her diet.  Clinical COPD Questionnaire On average, during the past week, how ofter did you feel:(Never = 0, Hardly ever = 1, a few times = 2, several times = 3, many times = 4, a great many times = 5, almost all the time = 6)  1.  Short of Breath at rest? 0 2.  Short of breath doing physical activities? 5 3.  Concerned about getting a cold or breathing getting worse? 6 4. Depressed (down) because of your breathing problems?  6  In general , during the past week, how much of the time:  5.  Did you cough? 2 6. Did you produce phlegm?  2  On average, during the past week, how limited were you in these activities because of your breathing problems:  7. Strenuous physical activities, e.g.,  Climbing stairs, hurrying, doing sports? 4 8. Moderate physical activities, e.g., walking, housework, carrying things?  2 9.  Daily activities at home, e.g., dressing, washing yourself?  0 10.  Social activities, e.g., talking, being with children, visiting friends or relatives?  0  Total score = 27  Atrial Fibrillation Subjective:   Patient is a 82 y.o. female who presents for follow-up of atrial fibrillation. Symptoms include none. Onset was several years ago, and have been stable since that time. Associated symptoms include: none. The patient denies chest pain, dizziness, palpitations and rapid heart beat. The patient has a past history of atrial fibrillation, CAD and HTN. The patient denies a past history of atrial fibrillation.  CHRONIC KIDNEY DISEASE  STAGE IV  - Onset: several years ago  - Use of NSAIDS: no - Taking ACEI/ARB: no     Taking Diuretic: yes - Edema: no        Fatigue: no   Memory/Confusion: yes but at baseline - Taking Vitamin D or analog: Cholecalciferaol -  Activity level: active - Anemia Symptoms: no - Voiding difficulties: no      The following portions of the patient's history were reviewed and updated as appropriate: allergies, current medications, past family history, past medical history, past social history, past surgical history and problem list.  Review of Systems Pertinent items are noted in HPI.   Objective:    BP 135/68 mmHg  Pulse 97  Temp(Src) 98.2 F (36.8 C) (Oral)  Ht 5\' 5"  (1.651 m)  Wt 164 lb (74.39 kg)  BMI 27.29 kg/m2  SpO2 99%  General Appearance:    Alert, cooperative, no distress, appears stated age  Head:    Normocephalic, without obvious abnormality, atraumatic  Eyes:    PERRL, conjunctiva/corneas clear, EOM's intact, fundi    benign, both eyes  Ears:    Normal TM's and external ear canals, both ears  Nose:   Nares normal, septum midline, mucosa normal, no drainage    or sinus tenderness  Throat:   Lips, mucosa, and tongue normal; teeth and gums normal  Neck:   Supple, symmetrical, trachea midline, no adenopathy;    thyroid:  no enlargement/tenderness/nodules; no carotid  bruit or JVD  Back:     Symmetric, no curvature, ROM normal, no CVA tenderness  Lungs:     Clear to auscultation bilaterally, respirations unlabored  Chest Wall:    No tenderness or deformity   Heart:    Regular rate and rhythm, S1 and S2 normal, no murmur, rub   or gallop  Breast Exam:    No tenderness, masses, or nipple abnormality  Abdomen:     Soft, non-tender, bowel sounds active all four quadrants,    no masses, no organomegaly  Genitalia:    Normal female without lesion, discharge or tenderness  Rectal:    Normal tone, normal prostate, no masses or tenderness;   guaiac negative stool  Extremities:    Extremities normal, atraumatic, (+)1 biloateralankle  edema  Pulses:   2+ and symmetric all extremities  Skin:   Skin color, texture, turgor normal, no rashes or lesions  Lymph nodes:   Cervical, supraclavicular, and axillary nodes normal  Neurologic:   CNII-XII intact, normal strength, sensation and reflexes    throughout     Assessment:      Plan:     Review of Systems     Objective:   Physical Exam        Assessment & Plan:

## 2015-09-03 NOTE — Patient Instructions (Signed)
Your heart is in a good rhythm today.   If you forget to take a Eliquis tablet, do not double up.  Just wait to take your next scheduled dose.   Your blood pressure and heart rate are good.   I will see you back in 4 to 6 weeks.

## 2015-09-04 ENCOUNTER — Encounter: Payer: Self-pay | Admitting: Family Medicine

## 2015-09-04 NOTE — Assessment & Plan Note (Signed)
Established problem. Stable. Continue current therapy BNP 335 in Dry state for pt on 6/1/17ov

## 2015-09-04 NOTE — Assessment & Plan Note (Signed)
Established problem. Stable. Continue monitoring and avoiding nephrotoxins as much as possible

## 2015-09-04 NOTE — Assessment & Plan Note (Signed)
Established problem Controlled Currently in regular rhythm with heart rate between 60 to 100 bpm on lower dose of metoprolol succ 25 mg daily Taking Eliquis without bleeding other than easy skin bruising of extremities

## 2015-09-04 NOTE — Assessment & Plan Note (Signed)
Established problem. Stable. Continue current therapy  

## 2015-09-07 DIAGNOSIS — H353221 Exudative age-related macular degeneration, left eye, with active choroidal neovascularization: Secondary | ICD-10-CM | POA: Diagnosis not present

## 2015-09-07 DIAGNOSIS — H353211 Exudative age-related macular degeneration, right eye, with active choroidal neovascularization: Secondary | ICD-10-CM | POA: Diagnosis not present

## 2015-09-13 NOTE — Progress Notes (Signed)
Cardiology Office Note    Date:  09/14/2015   ID:  [name], DOB [birth year], MRN [id number]  PCP:  Acquanetta Sit, MD  Cardiologist: Sinclair Grooms, MD   Chief Complaint  Patient presents with  . Atrial Fibrillation    History of Present Illness:  [name] is a 82 y.o. female for follow-up of coronary artery disease with previous coronary stent, paroxysmal atrial fibrillation, amiodarone therapy, chronic anticoagulation therapy, chronic diastolic heart failure, hypertension, and pacemaker for tachybradycardia syndrome.  No cardiopulmonary complaints. Dyspnea on exertion that she feels is related to COPD. She denies palpitations, transient neurological symptoms, medication side effects.  Past Medical History  Diagnosis Date  . Candidiasis of the esophagus 11/26/2007  . Myocardial infarct, old   . COPD, severe   . Sick sinus syndrome with tachycardia ([place])     MDT  . Pacemaker     MDT  . Chronic kidney disease (CKD), stage III (moderate)   . Hypertension   . Acute appendicitis with rupture   . Spinal stenosis, lumbar   . Adrenal adenoma     Incidentaloma  . Lumbar herniated disc     History of HNP L4/5 in 2003  . Hx of colonoscopy with polypectomy 04/27/2010    Dr Cristina Gong found three  tubular adenomas each less than 10 mm size  . Retinal hemorrhage of left eye 06/2010  . Cataract 2013    Bilateral   . Abnormal mammogram, unspecified 08/23/2010    Followup imaging reassuring.   Marland Kitchen DISC WITH RADICULOPATHY 04/27/2006    Qualifier: Diagnosis of  By: McDiarmid MD, Sherren Mocha    . Transitional cell carcinoma of ureter, history   . Mild cognitive impairment 10/26/2012    (10/25/12) Failed MiniCog screen  . 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 Normal uric acid serum level at time of attack.    Marland Kitchen EDEMA-LEGS,DUE TO VENOUS OBSTRUCT. 04/27/2006    Qualifier: Diagnosis of  By: McDiarmid MD, Sherren Mocha    .  ANXIETY 04/27/2006    Qualifier: Diagnosis of  By: McDiarmid MD, Sherren Mocha    . HERNIA, HIATAL, NONCONGENITAL 04/27/2006    Qualifier: Diagnosis of  By: McDiarmid MD, Sherren Mocha    . History of Hemorrhoids 04/27/2006    Qualifier: Diagnosis of  By: McDiarmid MD, Sherren Mocha    . RHINITIS, ALLERGIC 04/27/2006    Qualifier: Diagnosis of  By: McDiarmid MD, Sherren Mocha    . SCHATZKI'S RING, HX OF 11/26/2007    Qualifier: Diagnosis of  By: McDiarmid MD, Sherren Mocha  An EGD was performed by Dr Cristina Gong on 04/27/2010 for iron deficiency anemia. There was a a transient hiatal hernia with Schatzki's ring. Stomach and duodenum were normal. EGD on 03/06/12 by Dr Cristina Gong for IDA non-obstructing Schatzki's ring at Gastroesophageal junction, otherwise normal esophagus and stomach.    . Urge incontinence 12/13/2011    Diagnosed in 10/2011 by Dr Bjorn Loser (Urology)   . VITAMIN B12 DEFICIENCY 10/07/2009    Qualifier: Diagnosis of  By: McDiarmid MD, Sherren Mocha  Dx based on a post-TKR anemia work-up Low normal serum B12 with high Methylmalonic acid and homocysteine level  Vit B12 serum level (10/28/10) > 1500 pg/mL   . Vitamin D deficiency 11/02/2010    Serum vitamin D 25(OH) = 10.9 ng/mL (30 -100) on 10/28/10 c/w Vitamin D deficiency.     . AF (paroxysmal atrial fibrillation) ([place])  11/11/2010    Hospitalization (9/8-9/10, Dr Daneen Schick, III, Cardiology) for Paroxysmal Atrial Fibrillation with RVR and anginal pain secondary to demand/supply mismatch in setting of RVR with known circumflex artery branch disease.    . Iron deficiency anemia 08/05/2010    Dr Cristina Gong (GI) has evaluated with EGD, colonoscopy, and video capsular endoscopy in 2011 & 2012.  All have been unrevealing as to an origin of IDA.  OV with Dr Cristina Gong (10/28/10) assessment of blood in stool per hemoccult and GER. Hbg 12.1 g/dL, MCV 91.8, Ferritin 30 ng/mL. Patient taking on ferrous sulfate tab daily.   EGD on 03/06/12 by Dr Cristina Gong for IDA non-obstructing Schatzki's ring at Gastroesophageal  junction, otherwise normal esophagus and stomach.     . Macular degeneration, bilateral 10/04/2010    Right eye is wet MD, the other is dry macular degeneration (ARMD). Pt undergoing some form of vascular endothelial growth factor inhibition intraocular therapy.    . VENTRICULAR HYPERTROPHY, LEFT 08/28/2008    Qualifier: Diagnosis of  By: McDiarmid MD, Sherren Mocha    . COPD 04/27/2006    Qualifier: Diagnosis of  By: McDiarmid MD, Sherren Mocha    . Solitary kidney, acquired 05/17/2010    Surgical removal for transitional cell cancer by Tresa Endo, MD ([facility]). Surveillance cystoscopy by Dr Alinda Money [facility]) on 10/19/12 without evidence of cystoscopic recurrence. Recommend RTC one year for cystoscopy.   . ADENOMATOUS COLONIC POLYP 03/01/2003    Qualifier: Diagnosis of  By: McDiarmid MD, Sherren Mocha Multiple benign polyps of cecum, ascending, transverse and sigmoid colon by 8/09 colonoscopy by Dr Cristina Gong  Colonoscopy by Dr Cristina Gong for iron-deficiency anemia on 04/27/2010 showed three sessile polyps that were in ascending (3 mm x 9 mm), transverse (4 mm), and cecum (3 mm).  All three were tubular adenomas that were negative for high grade dysplasia or malignancy on pathology. Dr Cristina Gong called the polpys benign and not requiring follow-up in view of the patients age.    . Soft tissue injury of foot 05/03/2011  . PREDIABETES 09/11/2007    Qualifier: Diagnosis of  By: McDiarmid MD, Sherren Mocha    . Numbness and tingling in hands 07/21/2011  . MUSCLE CRAMPS 03/11/2010    Qualifier: Diagnosis of  By: McDiarmid MD, Sherren Mocha    . Leg cramps 05/29/2012  . Cellulitis of leg, right 10/01/2012  . Solar lentigo 06/15/2012  . Muscle spasm of back 09/24/2013  . Shoulder pain, left 01/09/2014  . Seborrheic keratosis, right anterior thigh 12/12/2013  . High risk medications (not anticoagulants) long-term use 03/05/2012  . CAP (community acquired pneumonia) 02/25/2015  . Decreased functional mobility and endurance 03/17/2015  . Diastolic heart failure ([place])  07/30/2015    Past Surgical History  Procedure Laterality Date  . Pacemaker insertion  2005    Dr Doreatha Lew  . Nephrectomy  For transition cell cancer     Dr Tresa Endo, surgeon  . Replacement total knee  2009, Right knee    Dr Wynelle Link  . Replacement total knee  05/2009, Left knee    Dr Wynelle Link  . Knee arthroscopy w/ synovectomy  11/2009, left knee    Dr Wynelle Link  . Cholecystectomy    . Esophagogastroduodenoscopy  04/27/2010    Dr Cristina Gong - found transient H/H & Schatzki's ring  . Esophagogastroduodenoscopy endoscopy  03/06/2012    Dr Cristina Gong - found non-obstuctive Schatzki's ring at [employer] [facility]. o/w normal EGD.   Marland Kitchen Pacemaker generator change N/A 11/12/2012    Procedure: PACEMAKER GENERATOR CHANGE;  Surgeon:  Evans Lance, MD; [employer] [facility] dual-chamber pacemaker serial number [id number]; Laterality: Right  . Tonsillectomy    . Breast surgery      breast reduction  . Eye surgery      bilateral cataracts  . Trigger finger release Right 05/22/2014    Procedure: RIGHT HAND A-1 PULLEY RELEASE ;  Surgeon: Roseanne Kaufman, MD;  Location: [place];  Service: Orthopedics;  Laterality: Right;  . Dupuytren contracture release Right 05/22/2014    Procedure: DUPUYTREN RELEASE AND REPAIR AS NECESSARY RIGHT RING FINGER AND MIDDLE FINGER;  Surgeon: Roseanne Kaufman, MD;  Location: [place];  Service: Orthopedics;  Laterality: Right;  . Pacemaker lead removal  2005    Removal and reinsertion of atrial and ventricular leads due to migration  . Coronary angioplasty with stent placement  2010    BMS RCA, OM2 occluded    Current Medications: Outpatient Prescriptions Prior to Visit  Medication Sig Dispense Refill  . albuterol (VENTOLIN HFA) 108 (90 BASE) MCG/ACT inhaler Inhale 2 puffs into the lungs every 6 (six) hours as needed. If needed for shortness of breath. 1 Inhaler 3  . amiodarone (PACERONE) 200 MG tablet Take 1 tablet (200 mg total) by mouth daily. 90 tablet 3  . apixaban (ELIQUIS) 2.5 MG TABS tablet Take  1 tablet (2.5 mg total) by mouth 2 (two) times daily. 60 tablet 5  . atorvastatin (LIPITOR) 20 MG tablet Take 1 tablet (20 mg total) by mouth daily. 30 tablet 11  . cholecalciferol (VITAMIN D) 400 units TABS tablet Take 400 Units by mouth daily.    . diclofenac sodium (VOLTAREN) 1 % GEL Apply 2 g topically 4 (four) times daily. 100 g 0  . docusate sodium (COLACE) 100 MG capsule Take 100 mg by mouth daily as needed for mild constipation. Reported on 07/08/2015    . Fluticasone-Salmeterol (ADVAIR DISKUS) 250-50 MCG/DOSE AEPB Inhale 2 puffs into the lungs 2 (two) times daily. 60 each 11  . furosemide (LASIX) 40 MG tablet Take 0.5 tablets (20 mg total) by mouth daily. 30 tablet 1  . LORazepam (ATIVAN) 0.5 MG tablet Take 0.5 mg by mouth every morning.    . Melatonin 3 MG TABS Take 3 mg by mouth at bedtime as needed (sleep).    . nitroGLYCERIN (NITROSTAT) 0.4 MG SL tablet Place 0.4 mg under the tongue every 5 (five) minutes as needed for chest pain (x 3 tabs).    Marland Kitchen omeprazole (PRILOSEC) 20 MG capsule Take 1 capsule (20 mg total) by mouth daily. 90 capsule 3  . polyethylene glycol (MIRALAX / GLYCOLAX) packet MIX 1 PACKET AND TK PO BID  0  . potassium chloride (K-DUR,KLOR-CON) 10 MEQ tablet Take 1 tablet (10 mEq total) by mouth daily. 90 tablet 3  . tiotropium (SPIRIVA) 18 MCG inhalation capsule Place 1 capsule (18 mcg total) into inhaler and inhale daily. 30 capsule PRN  . traMADol (ULTRAM) 50 MG tablet Take 1 tablet (50 mg total) by mouth every 6 (six) hours as needed for moderate pain. 30 tablet 1  . metoprolol succinate (TOPROL-XL) 50 MG 24 hr tablet Take 25 mg by mouth daily. Reported on 09/14/2015     No facility-administered medications prior to visit.     Allergies:   Baclofen; Codeine phosphate; Norco; Hydrochlorothiazide; and Montelukast sodium   Social History   Social History  . Marital Status: Widowed    Spouse Name: N/A  . Number of Children: 4  . Years of Education: N/A  Occupational History  . Futures trader     Retired   Social History Main Topics  . Smoking status: Former Smoker -- 1.00 packs/day    Types: Cigarettes    Quit date: 03/02/2009  . Smokeless tobacco: Never Used  . Alcohol Use: 12.0 oz/week    14 Glasses of wine, 6 Standard drinks or equivalent per week     Comment: 4-5 glasses of wine per week  . Drug Use: No  . Sexual Activity: No   Other Topics Concern  . 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    Semi-retired Futures trader.     Pt has home nebulizer for inhalation therapies.   Former Smoker   Smoking Status:  quit > 5 years ago      (+) DNR status per discussion with Dr McDiarmid 10/25/12 and reiterated 06/20/13 office visit .   (+) Living Will/Advance Directive   (+) HC-POA: Cecile Sheerer Causey (pt's dgt)     Family History:  The patient's family history includes Dementia in her mother; Heart disease in her father.   ROS:   Please see the history of present illness.    Progressive hearing loss, intermittent ankle swelling, constipation, anxiety, difficulty with balance, back discomfort, and easy bruising.  All other systems reviewed and are negative.   PHYSICAL EXAM:   VS:  BP 122/60 mmHg  Pulse 93  Ht 5\' 5"  (1.651 m)  Wt 163 lb 1.9 oz (73.991 kg)  BMI 27.14 kg/m2   GEN: Well nourished, well developed, in no acute distress HEENT: normal Neck: no JVD, carotid bruits, or masses Cardiac: RRR; no murmurs, rubs, or gallops,no edema  Respiratory:  clear to auscultation bilaterally, normal work of breathing GI: soft, nontender, nondistended, + BS MS: no deformity or atrophy Skin: warm and dry, no rash Neuro:  Alert and Oriented x 3, Strength and sensation are intact Psych: euthymic mood, full affect  Wt Readings from Last 3  Encounters:  09/14/15 163 lb 1.9 oz (73.991 kg)  09/03/15 164 lb (74.39 kg)  07/30/15 163 lb (73.936 kg)      Studies/Labs Reviewed:   EKG:  EKG  Is not performed. Atrial paced rhythm noted on 06/18/2015 EKG. No acute changes noted.  Recent Labs: 12/16/2014: TSH 3.133 02/28/2015: Magnesium 1.9 06/13/2015: ALT 15 07/30/2015: Brain Natriuretic Peptide 335.4*; BUN 28*; Creat 1.61*; Hemoglobin 10.3*; Platelets 265; Potassium 4.6; Sodium 138   Lipid Panel    Component Value Date/Time   CHOL 140 07/30/2015 1231   TRIG 143 07/30/2015 1231   HDL 41* 07/30/2015 1231   CHOLHDL 3.4 07/30/2015 1231   VLDL 29 07/30/2015 1231   LDLCALC 70 07/30/2015 1231   LDLDIRECT 74 05/08/2014 1110    Additional studies/ records that were reviewed today include:  Hepatic panel from April 17 was normal. No recent TSH.    ASSESSMENT:    1. PAF (paroxysmal atrial fibrillation) ([place])   2. Coronary artery disease involving native coronary artery of native heart without angina pectoris   3. SSS (sick sinus syndrome) ([place])   4. On amiodarone therapy   5. Pacemaker   6. Chronic anticoagulation      PLAN:  In order of problems listed above:  1. Asymptomatic and presumed rhythm control on amiodarone. 2. Asymptomatic  with reference to angina. Clinically active without limitations. 3. Pacemaker and amiodarone therapy without palpitations or clinical complaints. 4. No clinical evidence of amiodarone toxicity. Will need to have TSH and a panic panel performed in 6 months. 5. Stable 6. No bleeding complications    Medication Adjustments/Labs and Tests Ordered: Current medicines are reviewed at length with the patient today.  Concerns regarding medicines are outlined above.  Medication changes, Labs and Tests ordered today are listed in the Patient Instructions below. Patient Instructions  Medication Instructions:  Your physician recommends that you continue on your current medications as directed.  Please refer to the Current Medication list given to you today.   Labwork: Your physician recommends that you return for lab work in: [place] and [place] in 6 months   Testing/Procedures: None ordered  Follow-Up: Your physician wants you to follow-up in: 6 months Dr.Adelle Zachar You will receive a reminder letter in the mail two months in advance. If you don't receive a letter, please call our office to schedule the follow-up appointment.   Any Other Special Instructions Will Be Listed Below (If Applicable).     If you need a refill on your cardiac medications before your next appointment, please call your pharmacy.       Signed, Sinclair Grooms, MD  09/14/2015 11:09 AM    [facility] [address], [place], [place]  [address] Phone: [phone]; Fax: [phone]

## 2015-09-14 ENCOUNTER — Ambulatory Visit (INDEPENDENT_AMBULATORY_CARE_PROVIDER_SITE_OTHER): Payer: Medicare Other | Admitting: Interventional Cardiology

## 2015-09-14 ENCOUNTER — Encounter: Payer: Self-pay | Admitting: Interventional Cardiology

## 2015-09-14 ENCOUNTER — Other Ambulatory Visit: Payer: Self-pay | Admitting: Family Medicine

## 2015-09-14 VITALS — BP 122/60 | HR 93 | Ht 65.0 in | Wt 163.1 lb

## 2015-09-14 DIAGNOSIS — Z79899 Other long term (current) drug therapy: Secondary | ICD-10-CM

## 2015-09-14 DIAGNOSIS — I2510 Atherosclerotic heart disease of native coronary artery without angina pectoris: Secondary | ICD-10-CM | POA: Diagnosis not present

## 2015-09-14 DIAGNOSIS — I480 Paroxysmal atrial fibrillation: Secondary | ICD-10-CM

## 2015-09-14 DIAGNOSIS — Z950 Presence of cardiac pacemaker: Secondary | ICD-10-CM

## 2015-09-14 DIAGNOSIS — I25119 Atherosclerotic heart disease of native coronary artery with unspecified angina pectoris: Secondary | ICD-10-CM

## 2015-09-14 DIAGNOSIS — I495 Sick sinus syndrome: Secondary | ICD-10-CM | POA: Diagnosis not present

## 2015-09-14 DIAGNOSIS — Z7901 Long term (current) use of anticoagulants: Secondary | ICD-10-CM

## 2015-09-14 MED ORDER — METOPROLOL SUCCINATE ER 25 MG PO TB24: 25.0000 mg | ORAL_TABLET | Freq: Every day | ORAL | Status: DC

## 2015-09-14 NOTE — Patient Instructions (Addendum)
Medication Instructions:  Your physician recommends that you continue on your current medications as directed. Please refer to the Current Medication list given to you today.   Labwork: Your physician recommends that you return for lab work in: [place] and [place] in 6 months   Testing/Procedures: None ordered  Follow-Up: Your physician wants you to follow-up in: 6 months Dr.Smith You will receive a reminder letter in the mail two months in advance. If you don't receive a letter, please call our office to schedule the follow-up appointment.   Any Other Special Instructions Will Be Listed Below (If Applicable).     If you need a refill on your cardiac medications before your next appointment, please call your pharmacy.

## 2015-09-28 DIAGNOSIS — H353221 Exudative age-related macular degeneration, left eye, with active choroidal neovascularization: Secondary | ICD-10-CM | POA: Diagnosis not present

## 2015-09-30 DIAGNOSIS — Z8551 Personal history of malignant neoplasm of bladder: Secondary | ICD-10-CM | POA: Diagnosis not present

## 2015-09-30 HISTORY — PX: CYSTOSCOPY: SUR368

## 2015-10-01 ENCOUNTER — Encounter (INDEPENDENT_AMBULATORY_CARE_PROVIDER_SITE_OTHER): Payer: Self-pay

## 2015-10-01 ENCOUNTER — Encounter: Payer: Self-pay | Admitting: Internal Medicine

## 2015-10-01 ENCOUNTER — Ambulatory Visit (INDEPENDENT_AMBULATORY_CARE_PROVIDER_SITE_OTHER): Payer: Medicare Other | Admitting: *Deleted

## 2015-10-01 DIAGNOSIS — I495 Sick sinus syndrome: Secondary | ICD-10-CM | POA: Diagnosis not present

## 2015-10-01 LAB — CUP PACEART INCLINIC DEVICE CHECK
Battery Impedance: 203 Ohm
Battery Remaining Longevity: 122 mo
Battery Voltage: 2.79 V
Brady Statistic AP VP Percent: 2 %
Date Time Interrogation Session: 20170803104832
Implantable Lead Location: 753859
Implantable Lead Model: 5076
Lead Channel Impedance Value: 879 Ohm
Lead Channel Pacing Threshold Amplitude: 0.5 V
Lead Channel Pacing Threshold Amplitude: 0.75 V
Lead Channel Pacing Threshold Pulse Width: 0.4 ms
Lead Channel Pacing Threshold Pulse Width: 0.4 ms
Lead Channel Setting Sensing Sensitivity: 5.6 mV
MDC IDC LEAD IMPLANT DT: 20050523
MDC IDC LEAD IMPLANT DT: 20050523
MDC IDC LEAD LOCATION: 753860
MDC IDC MSMT LEADCHNL RA IMPEDANCE VALUE: 577 Ohm
MDC IDC MSMT LEADCHNL RA SENSING INTR AMPL: 2 mV
MDC IDC MSMT LEADCHNL RV PACING THRESHOLD AMPLITUDE: 0.625 V
MDC IDC MSMT LEADCHNL RV PACING THRESHOLD AMPLITUDE: 0.75 V
MDC IDC MSMT LEADCHNL RV PACING THRESHOLD PULSEWIDTH: 0.4 ms
MDC IDC MSMT LEADCHNL RV PACING THRESHOLD PULSEWIDTH: 0.4 ms
MDC IDC MSMT LEADCHNL RV SENSING INTR AMPL: 15.67 mV
MDC IDC SET LEADCHNL RA PACING AMPLITUDE: 1.5 V
MDC IDC SET LEADCHNL RV PACING AMPLITUDE: 2 V
MDC IDC SET LEADCHNL RV PACING PULSEWIDTH: 0.4 ms
MDC IDC STAT BRADY AP VS PERCENT: 96 %
MDC IDC STAT BRADY AS VP PERCENT: 0 %
MDC IDC STAT BRADY AS VS PERCENT: 2 %

## 2015-10-01 NOTE — Patient Instructions (Signed)
Pacemaker check in clinic. Normal device function. Thresholds, sensing, impedances consistent with previous measurements. Device programmed to maximize longevity. 106 mode switches, 55 AHR episodes, longest lasting 1hr, peak A >400 + eliquis (burden 0.2%). No high ventricular rates noted. Device programmed at appropriate safety margins. Histogram distribution appropriate for patient activity level. Device programmed to optimize intrinsic conduction. Estimated longevity 8.5-1yrs. Remote check 12/31/2015 and ROV with GT 03/2016

## 2015-10-13 ENCOUNTER — Encounter: Payer: Self-pay | Admitting: Family Medicine

## 2015-10-15 ENCOUNTER — Ambulatory Visit (INDEPENDENT_AMBULATORY_CARE_PROVIDER_SITE_OTHER): Payer: Medicare Other | Admitting: Family Medicine

## 2015-10-15 ENCOUNTER — Encounter: Payer: Self-pay | Admitting: Family Medicine

## 2015-10-15 VITALS — BP 146/76 | HR 96 | Ht 65.0 in | Wt 167.0 lb

## 2015-10-15 DIAGNOSIS — H9190 Unspecified hearing loss, unspecified ear: Secondary | ICD-10-CM

## 2015-10-15 DIAGNOSIS — I25119 Atherosclerotic heart disease of native coronary artery with unspecified angina pectoris: Secondary | ICD-10-CM

## 2015-10-15 DIAGNOSIS — Z8639 Personal history of other endocrine, nutritional and metabolic disease: Secondary | ICD-10-CM | POA: Diagnosis not present

## 2015-10-15 DIAGNOSIS — I480 Paroxysmal atrial fibrillation: Secondary | ICD-10-CM | POA: Diagnosis not present

## 2015-10-15 DIAGNOSIS — Z905 Acquired absence of kidney: Secondary | ICD-10-CM

## 2015-10-15 DIAGNOSIS — J449 Chronic obstructive pulmonary disease, unspecified: Secondary | ICD-10-CM

## 2015-10-15 DIAGNOSIS — N184 Chronic kidney disease, stage 4 (severe): Secondary | ICD-10-CM | POA: Diagnosis not present

## 2015-10-15 DIAGNOSIS — Z79899 Other long term (current) drug therapy: Secondary | ICD-10-CM

## 2015-10-15 LAB — BASIC METABOLIC PANEL WITH GFR
BUN: 34 mg/dL — ABNORMAL HIGH (ref 7–25)
CALCIUM: 9.1 mg/dL (ref 8.6–10.4)
CO2: 25 mmol/L (ref 20–31)
CREATININE: 1.72 mg/dL — AB (ref 0.60–0.88)
Chloride: 104 mmol/L (ref 98–110)
GFR, Est African American: 30 mL/min — ABNORMAL LOW (ref >=60)
GFR, Est Non African American: 26 mL/min — ABNORMAL LOW (ref >=60)
Glucose, Bld: 84 mg/dL (ref 65–99)
Potassium: 4.6 mmol/L (ref 3.5–5.3)
SODIUM: 140 mmol/L (ref 135–146)

## 2015-10-15 LAB — CBC
HCT: 33.1 % — ABNORMAL LOW (ref 35.0–45.0)
Hemoglobin: 10.5 g/dL — ABNORMAL LOW (ref 11.7–15.5)
MCH: 30.3 pg (ref 27.0–33.0)
MCHC: 31.7 g/dL — ABNORMAL LOW (ref 32.0–36.0)
MCV: 95.4 fL (ref 80.0–100.0)
MPV: 9.9 fL (ref 7.5–12.5)
PLATELETS: 263 10*3/uL (ref 140–400)
RBC: 3.47 MIL/uL — ABNORMAL LOW (ref 3.80–5.10)
RDW: 13.7 % (ref 11.0–15.0)
WBC: 5.6 10*3/uL (ref 3.8–10.8)

## 2015-10-15 NOTE — Patient Instructions (Addendum)
Decrease the Ativan (lorazepam) to half table each morning.  We will work on continuing to stop this Ativan.  Restart the doing the Home exercises for balance you received from the Physical therapist. Do the exercises every day.   Consider calling [facility] for an hearing evaluation and recommendations.   Try not to use the lasix (furosemide more than 3 times a week).    Dr Inessa Wardrop will call you if your tests are not good. Otherwise he will send you a letter.  If you sign up for MyChart online, you will be able to see your test results once Dr Alesia Oshields has reviewed them.  If you do not hear from [place] with in 2 weeks please call our office.

## 2015-10-16 ENCOUNTER — Telehealth: Payer: Self-pay | Admitting: Family Medicine

## 2015-10-16 ENCOUNTER — Encounter: Payer: Self-pay | Admitting: Family Medicine

## 2015-10-16 DIAGNOSIS — N184 Chronic kidney disease, stage 4 (severe): Secondary | ICD-10-CM

## 2015-10-16 DIAGNOSIS — H9190 Unspecified hearing loss, unspecified ear: Secondary | ICD-10-CM | POA: Insufficient documentation

## 2015-10-16 NOTE — Assessment & Plan Note (Signed)
Established problem worsened.  Patient's exertional HR 120 bpm walking approx 150 ft.  Return to baseline of 92 at rest Will recheck next visit after patient stops lasix which may have casued a degree of hypovolemia with resultant sinus tach with exertion. If HR >110 bpm with exertion persist, then consider increase of beta-blockade  Lab Results  Component Value Date   WBC 5.6 10/15/2015   HGB 10.5 (L) 10/15/2015   HCT 33.1 (L) 10/15/2015   MCV 95.4 10/15/2015   PLT 263 10/15/2015   Stable Hemoblobin on Eliquis.  Pt denies bleeding. Continue eliquis

## 2015-10-16 NOTE — Assessment & Plan Note (Addendum)
Established problem worsened.  Lab Results  Component Value Date   CREATININE 1.72 (H) 10/15/2015   Rising serum creatinine presumed from use of Lasix 40 mg daily for leg edema.   Encouraged patient to stop frequent use of lasix.  Decrease use to no more than three times a week. She is stopping Lasix for the next week and will RTC 8/23 for repeat BMET to monitor response.

## 2015-10-16 NOTE — Assessment & Plan Note (Signed)
Lab Results  Component Value Date   WBC 5.6 10/15/2015   HGB 10.5 (L) 10/15/2015   HCT 33.1 (L) 10/15/2015   MCV 95.4 10/15/2015   PLT 263 10/15/2015   Stable Hbg. No restart of iron currently

## 2015-10-16 NOTE — Assessment & Plan Note (Signed)
Established problem worsened.  Lab Results  Component Value Date   CREATININE 1.72 (H) 10/15/2015   Rising serum creatinine presumed from use of Lasix 40 mg daily for leg edema.   Encouraged patient to stop frequent use of lasix.  Decrease use to no more than three times a week. She is stopping Lasix for the next week and will RTC 8/23 for repeat BMET to monitor response.

## 2015-10-16 NOTE — Assessment & Plan Note (Signed)
Established problem. Stable. Continue current therapy  

## 2015-10-16 NOTE — Assessment & Plan Note (Signed)
New problem   Hearing Screening   Method: Audiometry   125Hz  250Hz  500Hz  1000Hz  2000Hz  3000Hz  4000Hz  6000Hz  8000Hz   Right ear:   40  40      Left ear:   40  40  40    High frequency loss in right ear Spotty hearing loss in left Referral to [facility] for evaluation and recommendation about hearing aids.

## 2015-10-16 NOTE — Progress Notes (Signed)
Subjective:    Patient ID: [name], female    DOB: 09/26/1923, 82 y.o.   MRN: [id number]  Toe Pain     Subjective:   Heart Failure  ARZETTA CADOTTE is a 82 y.o. female who presents for follow-up of congestive heart failure. Current symptoms include: none. She denies chest pain, dyspnea, fatigue, lower extremity edema and near-syncope. She states she is compliant most of the time with her medications. She states she is compliant most of the time with her diet.  Clinical COPD Questionnaire On average, during the past week, how ofter did you feel:(Never = 0, Hardly ever = 1, a few times = 2, several times = 3, many times = 4, a great many times = 5, almost all the time = 6)  1.  Short of Breath at rest? 0 2.  Short of breath doing physical activities? 4 3.  Concerned about getting a cold or breathing getting worse? 6 4. Depressed (down) because of your breathing problems?  4  In general , during the past week, how much of the time:  5.  Did you cough? 2 6. Did you produce phlegm?  1  On average, during the past week, how limited were you in these activities because of your breathing problems:  7. Strenuous physical activities, e.g.,  Climbing stairs, hurrying, doing sports? 4 8. Moderate physical activities, e.g., walking, housework, carrying things?  2 9.  Daily activities at home, e.g., dressing, washing yourself?  1 10.  Social activities, e.g., talking, being with children, visiting friends or relatives?  1  Total score = 25 (Previous Score 27)  Atrial Fibrillation Subjective:   Patient is a 82 y.o. female who presents for follow-up of atrial fibrillation. Symptoms include none. Onset was several years ago, and have been stable since that time. Associated symptoms include: none. The patient denies chest pain, dizziness, palpitations and rapid heart beat. The patient has a past history of atrial fibrillation, CAD and HTN. The patient denies a past history of atrial  fibrillation.  CHRONIC KIDNEY DISEASE STAGE IV  - Onset: several years ago  - Use of NSAIDS: no - Taking ACEI/ARB: no     Taking Diuretic: yes, daily furosemide for leg edema - Edema: no        Fatigue: no   Memory/Confusion: yes but at baseline - Taking Vitamin D or analog: Cholecalciferaol -  Activity level: active - Anemia Symptoms: no - Voiding difficulties: no   Hearing Loss Patient  complains of hearing loss. Onset of symptoms was gradual, beginning approximately a few years ago. Symptoms have been gradually worsening since that time. Symptoms include: difficult with voices in crowded rooms. Patient denies associated tinnitus. There is not a history of noise exposure. There is not a family history of early hearing loss.      Review of Systems Pertinent items are noted in HPI.   Objective:    BP (!) 146/76   Pulse 96   Ht 5\' 5"  (1.651 m)   Wt 167 lb (75.8 kg)   BMI 27.79 kg/m   General Appearance:    Alert, cooperative, no distress, appears stated age  Head:    Normocephalic, without obvious abnormality, atraumatic  Eyes:    PERRL, conjunctiva/corneas clear, EOM's intact, fundi    benign, both eyes  Ears:    Normal TM's and external ear canals, both ears  Nose:   Nares normal, septum midline, mucosa normal, no drainage    or  sinus tenderness  Throat:   Lips, mucosa, and tongue normal; teeth and gums normal  Neck:   Supple, symmetrical, trachea midline, no adenopathy;    thyroid:  no enlargement/tenderness/nodules; no carotid   bruit or JVD  Back:     Symmetric, no curvature, ROM normal, no CVA tenderness  Lungs:     Clear to auscultation bilaterally, respirations unlabored  Chest Wall:    No tenderness or deformity   Heart:    Regular rate and rhythm, S1 and S2 normal, no murmur, rub   or gallop  Breast Exam:    No tenderness, masses, or nipple abnormality  Abdomen:     Soft, non-tender, bowel sounds active all four quadrants,    no masses, no organomegaly    Genitalia:    Normal female without lesion, discharge or tenderness  Rectal:    Normal tone, normal prostate, no masses or tenderness;   guaiac negative stool  Extremities:   Extremities normal, atraumatic, (+)1 biloateralankle  edema  Pulses:   2+ and symmetric all extremities  Skin:   Skin color, texture, turgor normal, no rashes or lesions  Lymph nodes:   Cervical, supraclavicular, and axillary nodes normal  Neurologic:   CNII-XII intact, normal strength, sensation and reflexes    throughout     Assessment:      Plan:     Review of Systems     Objective:   Physical Exam        Assessment & Plan:

## 2015-10-16 NOTE — Telephone Encounter (Signed)
I informed [name] of the rise in her serum Creatinine, likely do to her self-initiated increase in Lasix. Tammy Deblanc said she would stop the Lasix.  She will return to [facility] lab on 8/23 for recheck of her serum creatinine off of Lasix.

## 2015-10-22 ENCOUNTER — Other Ambulatory Visit: Payer: Medicare Other

## 2015-10-22 DIAGNOSIS — N184 Chronic kidney disease, stage 4 (severe): Secondary | ICD-10-CM | POA: Diagnosis not present

## 2015-10-22 LAB — BASIC METABOLIC PANEL WITH GFR
BUN: 22 mg/dL (ref 7–25)
CALCIUM: 9.3 mg/dL (ref 8.6–10.4)
CO2: 24 mmol/L (ref 20–31)
CREATININE: 1.52 mg/dL — AB (ref 0.60–0.88)
Chloride: 107 mmol/L (ref 98–110)
GFR, EST AFRICAN AMERICAN: 35 mL/min — AB (ref >=60)
GFR, Est Non African American: 30 mL/min — ABNORMAL LOW (ref >=60)
GLUCOSE: 96 mg/dL (ref 65–99)
POTASSIUM: 4.6 mmol/L (ref 3.5–5.3)
Sodium: 141 mmol/L (ref 135–146)

## 2015-10-23 ENCOUNTER — Encounter: Payer: Self-pay | Admitting: Family Medicine

## 2015-10-29 ENCOUNTER — Telehealth: Payer: Self-pay | Admitting: Family Medicine

## 2015-10-29 MED ORDER — AZITHROMYCIN 250 MG PO TABS: ORAL_TABLET | ORAL | 0 refills | Status: DC

## 2015-10-29 NOTE — Telephone Encounter (Signed)
Pt informed of antibiotic sent to her pharmacy.

## 2015-10-29 NOTE — Telephone Encounter (Signed)
Please let [name] know that a prescription for Azithromycin antibiotic was sent to her [facility] [address]

## 2015-10-29 NOTE — Telephone Encounter (Signed)
Pt is coughing and has some color to it.  She is afraid it is going into pneumonia.  She is going on a cruise Sept 9 and does not want to get sick before she goes.

## 2015-11-03 ENCOUNTER — Encounter: Payer: Self-pay | Admitting: Emergency Medicine

## 2015-11-03 ENCOUNTER — Ambulatory Visit (INDEPENDENT_AMBULATORY_CARE_PROVIDER_SITE_OTHER): Payer: Medicare Other | Admitting: Emergency Medicine

## 2015-11-03 DIAGNOSIS — J449 Chronic obstructive pulmonary disease, unspecified: Secondary | ICD-10-CM | POA: Diagnosis not present

## 2015-11-03 DIAGNOSIS — I25119 Atherosclerotic heart disease of native coronary artery with unspecified angina pectoris: Secondary | ICD-10-CM

## 2015-11-03 NOTE — Progress Notes (Signed)
GERD  Subjective:    Patient ID: [name], female    DOB: 1923/08/06, 82 y.o.   MRN: [id number]  HPI 82 yo former smoker with a history of Horner disease, sick sinus syndrome, hypertension, chronic kidney disease, GERD with a hiatal hernia. She also has significant COPD for which she has been followed by Dr. Casper Harrison at [facility]. She was readmitted with A fib and RVR, associated with CP and dyspnea.   She has been managed on Spiriva and Advair. Has been treated with O2 at times in the past but is not on it currently She has not needed albuterol with any regularity.  She flares about once every 2 years. She has minimal coughing. She rarely wheezes. She uses lasix prn for LE edema.   She had Spirometry 2014 at [facility] >> FEV1 1.02L (60% pred).   ROV 07/14/15 -- patient has a history of CAD, hypertension, atrial fibrillation with diastolic CHF, COPD with an FEV1 of 1.02 L in 2014. Last seen in cardiology on 5/10 at which time she had been experiencing some orthostasis and hypotension. Her metoprolol was decreased from 50 mg down to 25 mg. She remains on Spiriva and Advair, uses albuterol when necessary. She was hospitalized x 2, once after a fall while on a cruise in mid-April. She was found to be hypoxemic, was told to restart her O2.   ROV 11/03/15 -- This is a follow-up visit for history of severe COPD with severe obstruction by spirometry. She also has a history of coronary disease, hypertension, age fibrillation and diastolic CHF as outlined above. She is only using lasix prn at this time, beginning about 3 weeks ago. She is having more trouble with mucous and some drainage. She was treated with azithro 8/31. Continues to have some associated dyspnea. She is now off lorazepam. She is on Advair and Spiriva.   Her daughter indicates that she snores loudly. She had a PSG remotely, no OSA.     Review of Systems  Constitutional: Negative for fever and unexpected weight change.  HENT: Positive for congestion  and sneezing. Negative for dental problem, ear pain, nosebleeds, postnasal drip, rhinorrhea, sinus pressure, sore throat and trouble swallowing.   Eyes: Negative for redness and itching.  Respiratory: Positive for shortness of breath. Negative for cough, chest tightness and wheezing.   Cardiovascular: Positive for chest pain and palpitations. Negative for leg swelling.  Gastrointestinal: Negative for nausea and vomiting.  Genitourinary: Negative for dysuria.  Musculoskeletal: Negative for joint swelling.  Skin: Negative for rash.  Neurological: Positive for headaches.  Hematological: Does not bruise/bleed easily.  Psychiatric/Behavioral: Negative for dysphoric mood. The patient is nervous/anxious.     Past Medical History:  Diagnosis Date  . Abnormal mammogram, unspecified 08/23/2010   Followup imaging reassuring.   . Acute appendicitis with rupture   . ADENOMATOUS COLONIC POLYP 03/01/2003   Qualifier: Diagnosis of  By: McDiarmid MD, Sherren Mocha Multiple benign polyps of cecum, ascending, transverse and sigmoid colon by 8/09 colonoscopy by Dr Cristina Gong  Colonoscopy by Dr Cristina Gong for iron-deficiency anemia on 04/27/2010 showed three sessile polyps that were in ascending (3 mm x 9 mm), transverse (4 mm), and cecum (3 mm).  All three were tubular adenomas that were negative for high grade dysplasia or malignancy on pathology. Dr Cristina Gong called the polpys benign and not requiring follow-up in view of the patients age.    . Adrenal adenoma    Incidentaloma  . AF (paroxysmal atrial fibrillation) ([place]) 11/11/2010  Hospitalization (9/8-9/10, Dr Daneen Schick, III, Cardiology) for Paroxysmal Atrial Fibrillation with RVR and anginal pain secondary to demand/supply mismatch in setting of RVR with known circumflex artery branch disease.    Marland Kitchen ANXIETY 04/27/2006   Qualifier: Diagnosis of  By: McDiarmid MD, Sherren Mocha    . Candidiasis of the esophagus 11/26/2007  . CAP (community acquired pneumonia) 02/25/2015  . Cataract  2013   Bilateral   . Cellulitis of leg, right 10/01/2012  . Cellulitis of right lower extremity   . Chest pain with moderate risk of acute coronary syndrome 05/21/2015  . Chronic kidney disease (CKD), stage III (moderate)   . Concussion with loss of consciousness   . COPD 04/27/2006   Qualifier: Diagnosis of  By: McDiarmid MD, Sherren Mocha    . COPD exacerbation ([place])   . COPD, severe   . Decreased functional mobility and endurance 03/17/2015  . Diastolic heart failure ([place]) 07/30/2015  . DISC WITH RADICULOPATHY 04/27/2006   Qualifier: Diagnosis of  By: McDiarmid MD, Sherren Mocha    . EDEMA-LEGS,DUE TO VENOUS OBSTRUCT. 04/27/2006   Qualifier: Diagnosis of  By: McDiarmid MD, Sherren Mocha    . 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 Normal uric acid serum level at time of attack.    Marland Kitchen HERNIA, HIATAL, NONCONGENITAL 04/27/2006   Qualifier: Diagnosis of  By: McDiarmid MD, Sherren Mocha    . High risk medications (not anticoagulants) long-term use 03/05/2012  . History of Hemorrhoids 04/27/2006   Qualifier: Diagnosis of  By: McDiarmid MD, Sherren Mocha    . Hx of colonoscopy with polypectomy 04/27/2010   Dr Cristina Gong found three  tubular adenomas each less than 10 mm size  . Hypertension   . Iron deficiency anemia 08/05/2010   Dr Cristina Gong (GI) has evaluated with EGD, colonoscopy, and video capsular endoscopy in 2011 & 2012.  All have been unrevealing as to an origin of IDA.  OV with Dr Cristina Gong (10/28/10) assessment of blood in stool per hemoccult and GER. Hbg 12.1 g/dL, MCV 91.8, Ferritin 30 ng/mL. Patient taking on ferrous sulfate tab daily.   EGD on 03/06/12 by Dr Cristina Gong for IDA non-obstructing Schatzki's ring at Gastroesophageal junction, otherwise normal esophagus and stomach.     . Leg cramps 05/29/2012  . Lumbar herniated disc    History of HNP L4/5 in 2003  . Macular degeneration, bilateral 10/04/2010   Right eye is wet MD, the other is dry macular degeneration (ARMD). Pt  undergoing some form of vascular endothelial growth factor inhibition intraocular therapy.    . Mild cognitive impairment 10/26/2012   (10/25/12) Failed MiniCog screen  . MUSCLE CRAMPS 03/11/2010   Qualifier: Diagnosis of  By: McDiarmid MD, Sherren Mocha    . Muscle spasm of back 09/24/2013  . Myocardial infarct, old   . Numbness and tingling in hands 07/21/2011  . Pacemaker    MDT  . PREDIABETES 09/11/2007   Qualifier: Diagnosis of  By: McDiarmid MD, Sherren Mocha    . Retinal hemorrhage of left eye 06/2010  . RHINITIS, ALLERGIC 04/27/2006   Qualifier: Diagnosis of  By: McDiarmid MD, Sherren Mocha    . SCHATZKI'S RING, HX OF 11/26/2007   Qualifier: Diagnosis of  By: McDiarmid MD, Sherren Mocha  An EGD was performed by Dr Cristina Gong on 04/27/2010 for iron deficiency anemia. There was a a transient hiatal hernia with Schatzki's ring. Stomach and duodenum were normal. EGD on 03/06/12 by Dr Cristina Gong for IDA non-obstructing Schatzki's  ring at Gastroesophageal junction, otherwise normal esophagus and stomach.    . Seborrheic keratosis, right anterior thigh 12/12/2013  . Shoulder pain, left 01/09/2014  . Sick sinus syndrome with tachycardia ([place])    MDT  . Soft tissue injury of foot 05/03/2011  . Solar lentigo 06/15/2012  . Solitary kidney, acquired 05/17/2010   Surgical removal for transitional cell cancer by Tresa Endo, MD ([place]). Surveillance cystoscopy by Dr Alinda Money [facility]) on 10/19/12 without evidence of cystoscopic recurrence. Recommend RTC one year for cystoscopy.   Marland Kitchen Spinal stenosis, lumbar   . Transitional cell carcinoma of ureter, history   . Urge incontinence 12/13/2011   Diagnosed in 10/2011 by Dr Bjorn Loser (Urology)   . VENTRICULAR HYPERTROPHY, LEFT 08/28/2008   Qualifier: Diagnosis of  By: McDiarmid MD, Sherren Mocha    . VITAMIN B12 DEFICIENCY 10/07/2009   Qualifier: Diagnosis of  By: McDiarmid MD, Sherren Mocha  Dx based on a post-TKR anemia work-up Low normal serum B12 with high Methylmalonic acid and homocysteine level  Vit B12  serum level (10/28/10) > 1500 pg/mL   . Vitamin D deficiency 11/02/2010   Serum vitamin D 25(OH) = 10.9 ng/mL (30 -100) on 10/28/10 c/w Vitamin D deficiency.        Family History  Problem Relation Age of Onset  . Dementia Mother   . Heart disease Father      Social History   Social History  . Marital status: Widowed    Spouse name: N/A  . Number of children: 4  . Years of education: N/A   Occupational History  . Futures trader Retired    Retired   Social History Main Topics  . Smoking status: Former Smoker    Packs/day: 1.00    Types: Cigarettes    Quit date: 03/02/2009  . Smokeless tobacco: Never Used  . Alcohol use 12.0 oz/week    14 Glasses of wine, 6 Standard drinks or equivalent per week     Comment: 4-5 glasses of wine per week  . Drug use: No  . Sexual activity: No   Other Topics Concern  . 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    Semi-retired Futures trader.     Pt has home nebulizer for inhalation therapies.   Former Smoker   Smoking Status:  quit > 5 years ago      (+) DNR status per discussion with Dr McDiarmid 10/25/12 and reiterated 06/20/13 office visit .   (+) Living Will/Advance Directive   (+) HC-POA: Cecile Sheerer Causey (pt's dgt)     Allergies  Allergen Reactions  . Baclofen Other (See Comments)    Confusion occurred with taking 3 at the same time.  . Codeine Phosphate Nausea Only  . Norco [Hydrocodone-Acetaminophen] Nausea And Vomiting  . Hydrochlorothiazide Other (See Comments)    Possible acute gout or arthritis of wrist  . Montelukast Sodium Other (See Comments)    Unspecified reaction.      Outpatient Medications Prior to Visit  Medication Sig Dispense Refill  . amiodarone (PACERONE) 200 MG tablet Take 1 tablet (200 mg total) by mouth daily. 90  tablet 3  . apixaban (ELIQUIS) 2.5 MG TABS tablet Take 1 tablet (2.5 mg total) by mouth 2 (two) times daily. 60 tablet 5  .  atorvastatin (LIPITOR) 20 MG tablet Take 1 tablet (20 mg total) by mouth daily. 30 tablet 11  . cholecalciferol (VITAMIN D) 400 units TABS tablet Take 400 Units by mouth daily.    . diclofenac sodium (VOLTAREN) 1 % GEL Apply 2 g topically 4 (four) times daily. 100 g 0  . docusate sodium (COLACE) 100 MG capsule Take 100 mg by mouth daily as needed for mild constipation. Reported on 07/08/2015    . Fluticasone-Salmeterol (ADVAIR DISKUS) 250-50 MCG/DOSE AEPB Inhale 2 puffs into the lungs 2 (two) times daily. 60 each 11  . furosemide (LASIX) 40 MG tablet Take 0.5 tablets (20 mg total) by mouth daily. 30 tablet 1  . Melatonin 3 MG TABS Take 3 mg by mouth at bedtime as needed (sleep).    . metoprolol succinate (TOPROL-XL) 25 MG 24 hr tablet Take 1 tablet (25 mg total) by mouth daily. 90 tablet 3  . nitroGLYCERIN (NITROSTAT) 0.4 MG SL tablet Place 0.4 mg under the tongue every 5 (five) minutes as needed for chest pain (x 3 tabs).    Marland Kitchen omeprazole (PRILOSEC) 20 MG capsule Take 1 capsule (20 mg total) by mouth daily. 90 capsule 3  . polyethylene glycol (MIRALAX / GLYCOLAX) packet MIX 1 PACKET AND TK PO BID  0  . potassium chloride (K-DUR,KLOR-CON) 10 MEQ tablet Take 1 tablet (10 mEq total) by mouth daily. 90 tablet 3  . Sennosides 25 MG TABS Take 2 tablets (50 mg total) by mouth daily as needed. 120 each 0  . tiotropium (SPIRIVA) 18 MCG inhalation capsule Place 1 capsule (18 mcg total) into inhaler and inhale daily. 30 capsule PRN  . traMADol (ULTRAM) 50 MG tablet Take 1 tablet (50 mg total) by mouth every 6 (six) hours as needed for moderate pain. 30 tablet 1  . VENTOLIN HFA 108 (90 Base) MCG/ACT inhaler INHALE 2 PUFFS WITH SPACER EVERY 4 HOURS AS NEEDED FOR WHEEZING 18 g 5  . azithromycin (ZITHROMAX) 250 MG tablet Take 2 tabs day 1, then 1 tab daily 6 each 0  . LORazepam (ATIVAN) 0.5  MG tablet Take 0.5 mg by mouth every morning.     No facility-administered medications prior to visit.          Objective:   Physical Exam  Vitals:   11/03/15 1037  BP: 120/74  Pulse: 86  SpO2: 96%  Weight: 163 lb (73.9 kg)  Height: 5\' 5"  (1.651 m)   Gen: Pleasant, elderly woman in no distress,  normal affect  ENT: No lesions,  mouth clear,  oropharynx clear, no postnasal drip  Neck: No JVD, no stridor  Lungs: No use of accessory muscles, somewhat distant, very mild soft wheezes left greater than right, mostly clear  Cardiovascular: Irregular, heart sounds normal, no murmur or gallops, no peripheral edema  Musculoskeletal: No deformities, no cyanosis or clubbing  Neuro: alert, non focal  Skin: Warm, no lesions or rashes      Assessment & Plan:  COPD, severe (HCC) Apparently with a mild recent flare that has improved some with azithromycin. I did question whether she may have had a component of some volume overload since her Lasix has recently been changed to when necessary but her exam doesn't support this. I will add any additional therapy to the azithromycin. Continue her maintenance meds. Defer her flu shot until she gets back from her planned trip  Please continue your Advair and Spiriva Take albuterol 2 puffs up to every 4 hours if needed for  shortness of breath.  Continue to use lasix only as needed, as directed by Dr McDiarmid Get the flu shot this Fall Follow with Dr Lamonte Sakai in 3 months or sooner if you have any problems.  Baltazar Apo, MD, PhD 11/03/2015, 10:59 AM [facility] [phone] or if no answer [phone]

## 2015-11-03 NOTE — Patient Instructions (Addendum)
Please continue your Advair and Spiriva Take albuterol 2 puffs up to every 4 hours if needed for shortness of breath.  Continue to use lasix only as needed, as directed by Dr McDiarmid Get the flu shot this Fall Follow with Dr Lamonte Sakai in 3 months or sooner if you have any problems.

## 2015-11-03 NOTE — Assessment & Plan Note (Signed)
Apparently with a mild recent flare that has improved some with azithromycin. I did question whether she may have had a component of some volume overload since her Lasix has recently been changed to when necessary but her exam doesn't support this. I will add any additional therapy to the azithromycin. Continue her maintenance meds. Defer her flu shot until she gets back from her planned trip  Please continue your Advair and Spiriva Take albuterol 2 puffs up to every 4 hours if needed for shortness of breath.  Continue to use lasix only as needed, as directed by Dr McDiarmid Get the flu shot this Fall Follow with Dr Lamonte Sakai in 3 months or sooner if you have any problems.

## 2015-11-04 DIAGNOSIS — H353211 Exudative age-related macular degeneration, right eye, with active choroidal neovascularization: Secondary | ICD-10-CM | POA: Diagnosis not present

## 2015-11-04 DIAGNOSIS — H353221 Exudative age-related macular degeneration, left eye, with active choroidal neovascularization: Secondary | ICD-10-CM | POA: Diagnosis not present

## 2015-11-05 ENCOUNTER — Telehealth: Payer: Self-pay | Admitting: Family Medicine

## 2015-11-05 NOTE — Telephone Encounter (Signed)
I spoke with Mrs Manger. Her trip to [place] has been canceled due to weather.  She will see if her condition worsens over weekend and will come in for an SDA if it does.

## 2015-11-05 NOTE — Telephone Encounter (Signed)
Pt has finished her z-pack but is still coughing up "stuff" she is leaving tomorrow morning for an 8 day cruise/. She doesn't want to go on her trip and get sick on the trip.  She would like to talk to dr Mingo Amber

## 2015-11-08 ENCOUNTER — Inpatient Hospital Stay (HOSPITAL_COMMUNITY)
Admission: EM | Admit: 2015-11-08 | Discharge: 2015-11-13 | DRG: 190 | Disposition: A | Discharge status: Home Health | Payer: Medicare Other | Attending: Internal Medicine | Admitting: Internal Medicine

## 2015-11-08 ENCOUNTER — Encounter (HOSPITAL_COMMUNITY): Payer: Self-pay | Admitting: Emergency Medicine

## 2015-11-08 ENCOUNTER — Ambulatory Visit (INDEPENDENT_AMBULATORY_CARE_PROVIDER_SITE_OTHER): Payer: Medicare Other

## 2015-11-08 ENCOUNTER — Ambulatory Visit (HOSPITAL_COMMUNITY)
Admission: EM | Admit: 2015-11-08 | Discharge: 2015-11-08 | Disposition: A | Discharge status: Nursing Home (Includes ICF) | Payer: Medicare Other | Attending: Family Medicine | Admitting: Family Medicine

## 2015-11-08 DIAGNOSIS — F419 Anxiety disorder, unspecified: Secondary | ICD-10-CM | POA: Diagnosis present

## 2015-11-08 DIAGNOSIS — J9621 Acute and chronic respiratory failure with hypoxia: Secondary | ICD-10-CM | POA: Diagnosis present

## 2015-11-08 DIAGNOSIS — Z905 Acquired absence of kidney: Secondary | ICD-10-CM

## 2015-11-08 DIAGNOSIS — J441 Chronic obstructive pulmonary disease with (acute) exacerbation: Secondary | ICD-10-CM | POA: Diagnosis not present

## 2015-11-08 DIAGNOSIS — Z955 Presence of coronary angioplasty implant and graft: Secondary | ICD-10-CM

## 2015-11-08 DIAGNOSIS — I480 Paroxysmal atrial fibrillation: Secondary | ICD-10-CM | POA: Diagnosis present

## 2015-11-08 DIAGNOSIS — Z885 Allergy status to narcotic agent status: Secondary | ICD-10-CM

## 2015-11-08 DIAGNOSIS — N184 Chronic kidney disease, stage 4 (severe): Secondary | ICD-10-CM | POA: Diagnosis not present

## 2015-11-08 DIAGNOSIS — Z9861 Coronary angioplasty status: Secondary | ICD-10-CM

## 2015-11-08 DIAGNOSIS — R05 Cough: Secondary | ICD-10-CM | POA: Diagnosis not present

## 2015-11-08 DIAGNOSIS — R2689 Other abnormalities of gait and mobility: Secondary | ICD-10-CM

## 2015-11-08 DIAGNOSIS — I130 Hypertensive heart and chronic kidney disease with heart failure and stage 1 through stage 4 chronic kidney disease, or unspecified chronic kidney disease: Secondary | ICD-10-CM | POA: Diagnosis not present

## 2015-11-08 DIAGNOSIS — Z8249 Family history of ischemic heart disease and other diseases of the circulatory system: Secondary | ICD-10-CM

## 2015-11-08 DIAGNOSIS — J9801 Acute bronchospasm: Secondary | ICD-10-CM

## 2015-11-08 DIAGNOSIS — R51 Headache: Secondary | ICD-10-CM | POA: Diagnosis present

## 2015-11-08 DIAGNOSIS — K219 Gastro-esophageal reflux disease without esophagitis: Secondary | ICD-10-CM | POA: Diagnosis present

## 2015-11-08 DIAGNOSIS — R0609 Other forms of dyspnea: Secondary | ICD-10-CM

## 2015-11-08 DIAGNOSIS — I2510 Atherosclerotic heart disease of native coronary artery without angina pectoris: Secondary | ICD-10-CM | POA: Diagnosis present

## 2015-11-08 DIAGNOSIS — G3184 Mild cognitive impairment, so stated: Secondary | ICD-10-CM | POA: Diagnosis present

## 2015-11-08 DIAGNOSIS — H9190 Unspecified hearing loss, unspecified ear: Secondary | ICD-10-CM | POA: Diagnosis present

## 2015-11-08 DIAGNOSIS — R5383 Other fatigue: Secondary | ICD-10-CM

## 2015-11-08 DIAGNOSIS — I5032 Chronic diastolic (congestive) heart failure: Secondary | ICD-10-CM | POA: Diagnosis not present

## 2015-11-08 DIAGNOSIS — Z888 Allergy status to other drugs, medicaments and biological substances status: Secondary | ICD-10-CM

## 2015-11-08 DIAGNOSIS — Z7901 Long term (current) use of anticoagulants: Secondary | ICD-10-CM | POA: Diagnosis not present

## 2015-11-08 DIAGNOSIS — Z96653 Presence of artificial knee joint, bilateral: Secondary | ICD-10-CM | POA: Diagnosis present

## 2015-11-08 DIAGNOSIS — E559 Vitamin D deficiency, unspecified: Secondary | ICD-10-CM | POA: Diagnosis present

## 2015-11-08 DIAGNOSIS — N179 Acute kidney failure, unspecified: Secondary | ICD-10-CM | POA: Diagnosis not present

## 2015-11-08 DIAGNOSIS — Z79899 Other long term (current) drug therapy: Secondary | ICD-10-CM

## 2015-11-08 DIAGNOSIS — Z950 Presence of cardiac pacemaker: Secondary | ICD-10-CM

## 2015-11-08 DIAGNOSIS — I252 Old myocardial infarction: Secondary | ICD-10-CM

## 2015-11-08 DIAGNOSIS — H3530 Unspecified macular degeneration: Secondary | ICD-10-CM | POA: Diagnosis present

## 2015-11-08 DIAGNOSIS — Z87891 Personal history of nicotine dependence: Secondary | ICD-10-CM | POA: Diagnosis not present

## 2015-11-08 DIAGNOSIS — J449 Chronic obstructive pulmonary disease, unspecified: Secondary | ICD-10-CM

## 2015-11-08 HISTORY — DX: Gastro-esophageal reflux disease without esophagitis: K21.9

## 2015-11-08 LAB — BASIC METABOLIC PANEL
ANION GAP: 9 (ref 5–15)
BUN: 24 mg/dL — ABNORMAL HIGH (ref 6–20)
CO2: 24 mmol/L (ref 22–32)
Calcium: 9.1 mg/dL (ref 8.9–10.3)
Chloride: 101 mmol/L (ref 101–111)
Creatinine, Ser: 1.73 mg/dL — ABNORMAL HIGH (ref 0.44–1.00)
GFR calc Af Amer: 29 mL/min — ABNORMAL LOW (ref >60)
GFR calc non Af Amer: 25 mL/min — ABNORMAL LOW (ref >60)
Glucose, Bld: 122 mg/dL — ABNORMAL HIGH (ref 65–99)
POTASSIUM: 4.1 mmol/L (ref 3.5–5.1)
SODIUM: 134 mmol/L — AB (ref 135–145)

## 2015-11-08 LAB — I-STAT ARTERIAL BLOOD GAS, ED
ACID-BASE DEFICIT: 2 mmol/L (ref 0.0–2.0)
BICARBONATE: 22.9 mmol/L (ref 20.0–28.0)
O2 Saturation: 99 %
PH ART: 7.355 (ref 7.350–7.450)
PO2 ART: 120 mmHg — AB (ref 83.0–108.0)
Patient temperature: 98.6
TCO2: 24 mmol/L (ref 0–100)
pCO2 arterial: 41.1 mmHg (ref 32.0–48.0)

## 2015-11-08 LAB — I-STAT TROPONIN, ED: TROPONIN I, POC: 0.01 ng/mL (ref 0.00–0.08)

## 2015-11-08 LAB — CBC
HEMATOCRIT: 36.7 % (ref 36.0–46.0)
HEMOGLOBIN: 11.5 g/dL — AB (ref 12.0–15.0)
MCH: 30.7 pg (ref 26.0–34.0)
MCHC: 31.3 g/dL (ref 30.0–36.0)
MCV: 97.9 fL (ref 78.0–100.0)
Platelets: 265 10*3/uL (ref 150–400)
RBC: 3.75 MIL/uL — ABNORMAL LOW (ref 3.87–5.11)
RDW: 12.9 % (ref 11.5–15.5)
WBC: 8.8 10*3/uL (ref 4.0–10.5)

## 2015-11-08 MED ORDER — IPRATROPIUM-ALBUTEROL 0.5-2.5 (3) MG/3ML IN SOLN
3.0000 mL | Freq: Once | RESPIRATORY_TRACT | Status: AC
  Administered 2015-11-08: 3 mL via RESPIRATORY_TRACT

## 2015-11-08 MED ORDER — NITROGLYCERIN 0.4 MG SL SUBL: 0.4000 mg | SUBLINGUAL_TABLET | SUBLINGUAL | Status: DC | PRN

## 2015-11-08 MED ORDER — ALBUTEROL SULFATE (2.5 MG/3ML) 0.083% IN NEBU
2.5000 mg | INHALATION_SOLUTION | Freq: Once | RESPIRATORY_TRACT | Status: AC
  Administered 2015-11-08: 2.5 mg via RESPIRATORY_TRACT

## 2015-11-08 MED ORDER — SENNA 8.6 MG PO TABS
2.0000 | ORAL_TABLET | Freq: Every day | ORAL | Status: DC | PRN
  Filled 2015-11-08: qty 2

## 2015-11-08 MED ORDER — CHOLECALCIFEROL 10 MCG (400 UNIT) PO TABS
400.0000 [IU] | ORAL_TABLET | Freq: Every day | ORAL | Status: DC
  Administered 2015-11-09 – 2015-11-13 (×5): 400 [IU] via ORAL
  Filled 2015-11-08 (×5): qty 1

## 2015-11-08 MED ORDER — PREDNISONE 20 MG PO TABS: 50.0000 mg | ORAL_TABLET | Freq: Once | ORAL | Status: DC

## 2015-11-08 MED ORDER — PANTOPRAZOLE SODIUM 40 MG PO TBEC
40.0000 mg | DELAYED_RELEASE_TABLET | Freq: Every day | ORAL | Status: DC
  Administered 2015-11-09 – 2015-11-13 (×5): 40 mg via ORAL
  Filled 2015-11-08 (×5): qty 1

## 2015-11-08 MED ORDER — IPRATROPIUM-ALBUTEROL 0.5-2.5 (3) MG/3ML IN SOLN
RESPIRATORY_TRACT | Status: AC
  Filled 2015-11-08: qty 3

## 2015-11-08 MED ORDER — METHYLPREDNISOLONE SODIUM SUCC 125 MG IJ SOLR
60.0000 mg | Freq: Three times a day (TID) | INTRAMUSCULAR | Status: DC
  Administered 2015-11-09 (×2): 60 mg via INTRAVENOUS
  Filled 2015-11-08 (×2): qty 2

## 2015-11-08 MED ORDER — IPRATROPIUM-ALBUTEROL 0.5-2.5 (3) MG/3ML IN SOLN
3.0000 mL | Freq: Once | RESPIRATORY_TRACT | Status: AC
  Administered 2015-11-08: 3 mL via RESPIRATORY_TRACT
  Filled 2015-11-08: qty 3

## 2015-11-08 MED ORDER — METHYLPREDNISOLONE SODIUM SUCC 125 MG IJ SOLR
INTRAMUSCULAR | Status: AC
  Filled 2015-11-08: qty 2

## 2015-11-08 MED ORDER — APIXABAN 2.5 MG PO TABS
2.5000 mg | ORAL_TABLET | Freq: Two times a day (BID) | ORAL | Status: DC
  Administered 2015-11-09 – 2015-11-13 (×10): 2.5 mg via ORAL
  Filled 2015-11-08 (×10): qty 1

## 2015-11-08 MED ORDER — DOCUSATE SODIUM 100 MG PO CAPS: 100.0000 mg | ORAL_CAPSULE | Freq: Every day | ORAL | Status: DC | PRN

## 2015-11-08 MED ORDER — ATORVASTATIN CALCIUM 20 MG PO TABS
20.0000 mg | ORAL_TABLET | Freq: Every day | ORAL | Status: DC
  Administered 2015-11-09 – 2015-11-13 (×5): 20 mg via ORAL
  Filled 2015-11-08 (×5): qty 1

## 2015-11-08 MED ORDER — ALBUTEROL SULFATE (2.5 MG/3ML) 0.083% IN NEBU
INHALATION_SOLUTION | RESPIRATORY_TRACT | Status: AC
  Filled 2015-11-08: qty 3

## 2015-11-08 MED ORDER — DOXYCYCLINE HYCLATE 100 MG PO TABS
100.0000 mg | ORAL_TABLET | Freq: Two times a day (BID) | ORAL | Status: DC
  Administered 2015-11-09 – 2015-11-13 (×10): 100 mg via ORAL
  Filled 2015-11-08 (×10): qty 1

## 2015-11-08 MED ORDER — ALBUTEROL SULFATE (2.5 MG/3ML) 0.083% IN NEBU: 2.5000 mg | INHALATION_SOLUTION | RESPIRATORY_TRACT | Status: DC | PRN

## 2015-11-08 MED ORDER — AMIODARONE HCL 200 MG PO TABS
200.0000 mg | ORAL_TABLET | Freq: Every day | ORAL | Status: DC
  Administered 2015-11-09 – 2015-11-13 (×5): 200 mg via ORAL
  Filled 2015-11-08 (×5): qty 1

## 2015-11-08 MED ORDER — POLYETHYLENE GLYCOL 3350 17 G PO PACK: 17.0000 g | PACK | Freq: Every day | ORAL | Status: DC | PRN

## 2015-11-08 MED ORDER — METOPROLOL SUCCINATE ER 25 MG PO TB24
25.0000 mg | ORAL_TABLET | Freq: Every day | ORAL | Status: DC
  Administered 2015-11-09 – 2015-11-13 (×5): 25 mg via ORAL
  Filled 2015-11-08 (×5): qty 1

## 2015-11-08 MED ORDER — MELATONIN 3 MG PO TABS
3.0000 mg | ORAL_TABLET | Freq: Every evening | ORAL | Status: DC | PRN
  Filled 2015-11-08: qty 1

## 2015-11-08 MED ORDER — DM-GUAIFENESIN ER 30-600 MG PO TB12
1.0000 | ORAL_TABLET | Freq: Two times a day (BID) | ORAL | Status: DC | PRN
  Administered 2015-11-09: 1 via ORAL
  Filled 2015-11-08: qty 1

## 2015-11-08 MED ORDER — DICLOFENAC SODIUM 1 % TD GEL: 2.0000 g | Freq: Four times a day (QID) | TRANSDERMAL | Status: DC

## 2015-11-08 MED ORDER — FUROSEMIDE 20 MG PO TABS
20.0000 mg | ORAL_TABLET | Freq: Every day | ORAL | Status: DC
  Administered 2015-11-09: 20 mg via ORAL
  Filled 2015-11-08: qty 1

## 2015-11-08 MED ORDER — IPRATROPIUM-ALBUTEROL 0.5-2.5 (3) MG/3ML IN SOLN
3.0000 mL | RESPIRATORY_TRACT | Status: DC
  Administered 2015-11-09 (×2): 3 mL via RESPIRATORY_TRACT
  Filled 2015-11-08 (×2): qty 3

## 2015-11-08 MED ORDER — METHYLPREDNISOLONE SODIUM SUCC 125 MG IJ SOLR
125.0000 mg | Freq: Once | INTRAMUSCULAR | Status: AC
  Administered 2015-11-08: 125 mg via INTRAMUSCULAR

## 2015-11-08 MED ORDER — TRAMADOL HCL 50 MG PO TABS
50.0000 mg | ORAL_TABLET | Freq: Four times a day (QID) | ORAL | Status: DC | PRN
  Administered 2015-11-09 – 2015-11-11 (×7): 50 mg via ORAL
  Filled 2015-11-08 (×7): qty 1

## 2015-11-08 NOTE — H&P (Signed)
History and Physical    [name] JOA:[id number] DOB: 1924/01/30 DOA: 11/08/2015  Referring MD/NP/PA:   PCP: MCDIARMID,TODD D, MD   Patient coming from:  The patient is coming from home.  At baseline, pt is independent for most of ADL.  Chief Complaint: Productive cough and shortness of breath  HPI: [name] is a 82 y.o. female with medical history significant of COPD, not on chronic oxygen, CKD-IV, PAF on Eliquis, diastolic heart failure, sick sinus syndrome status post pacemaker placement, hypertension, GERD, CAD, who presents with productive cough and shortness of breath.  Patient says that she has been having cough and shortness of breath for almost 2 weeks, which has been progressively getting worse. No fever, chills or chest pain.  She was started on azithromycin by her pulmonologist and had follow-up 5 days ago with some improvement in her symptoms. States that the next day she began to develop a productive cough, with yellow culture sputum production. Her SOB has also worsened, particularly on exertion. Patient speaks in full sentences. Patient denies nausea, vomiting, diarrhea, abdominal pain, symptomsof UTI or unilateral weakness.  ED Course: pt was found to have negative troponin, WBC 8.8, oxygen saturation 88% on room air, stable renal function, negative chest x-ray for infiltration. Patient is placed on telemetry bed for observation.  Review of Systems:   General: no fevers, chills, no changes in body weight, Has fatigue HEENT: no blurry vision, hearing changes or sore throat Respiratory: has dyspnea, coughing, wheezing CV: no chest pain, no palpitations GI: no nausea, vomiting, abdominal pain, diarrhea, constipation GU: no dysuria, burning on urination, increased urinary frequency, hematuria  Ext: has mild leg edema Neuro: no unilateral weakness, numbness, or tingling, no vision change or hearing loss Skin: no rash, no skin tear. MSK: No muscle spasm, no deformity, no  limitation of range of movement in spin Heme: No easy bruising.  Travel history: No recent long distant travel.  Allergy:  Allergies  Allergen Reactions  . Baclofen Other (See Comments)    Confusion occurred with taking 3 at the same time.  . Codeine Phosphate Nausea Only  . Norco [Hydrocodone-Acetaminophen] Nausea And Vomiting  . Hydrochlorothiazide Other (See Comments)    Possible acute gout or arthritis of wrist  . Montelukast Sodium Other (See Comments)    Unspecified reaction.     Past Medical History:  Diagnosis Date  . Abnormal mammogram, unspecified 08/23/2010   Followup imaging reassuring.   . Acute appendicitis with rupture   . ADENOMATOUS COLONIC POLYP 03/01/2003   Qualifier: Diagnosis of  By: McDiarmid MD, Sherren Mocha Multiple benign polyps of cecum, ascending, transverse and sigmoid colon by 8/09 colonoscopy by Dr Cristina Gong  Colonoscopy by Dr Cristina Gong for iron-deficiency anemia on 04/27/2010 showed three sessile polyps that were in ascending (3 mm x 9 mm), transverse (4 mm), and cecum (3 mm).  All three were tubular adenomas that were negative for high grade dysplasia or malignancy on pathology. Dr Cristina Gong called the polpys benign and not requiring follow-up in view of the patients age.    . Adrenal adenoma    Incidentaloma  . AF (paroxysmal atrial fibrillation) ([place]) 11/11/2010   Hospitalization (9/8-9/10, Dr Daneen Schick, III, Cardiology) for Paroxysmal Atrial Fibrillation with RVR and anginal pain secondary to demand/supply mismatch in setting of RVR with known circumflex artery branch disease.    Marland Kitchen ANXIETY 04/27/2006   Qualifier: Diagnosis of  By: McDiarmid MD, Sherren Mocha    . Candidiasis of the esophagus 11/26/2007  .  CAP (community acquired pneumonia) 02/25/2015  . Cataract 2013   Bilateral   . Cellulitis of leg, right 10/01/2012  . Cellulitis of right lower extremity   . Chest pain with moderate risk of acute coronary syndrome 05/21/2015  . Chronic kidney disease (CKD), stage III  (moderate)   . Concussion with loss of consciousness   . COPD 04/27/2006   Qualifier: Diagnosis of  By: McDiarmid MD, Sherren Mocha    . COPD exacerbation ([place])   . COPD, severe   . Decreased functional mobility and endurance 03/17/2015  . Diastolic heart failure ([place]) 07/30/2015  . DISC WITH RADICULOPATHY 04/27/2006   Qualifier: Diagnosis of  By: McDiarmid MD, Sherren Mocha    . EDEMA-LEGS,DUE TO VENOUS OBSTRUCT. 04/27/2006   Qualifier: Diagnosis of  By: McDiarmid MD, Sherren Mocha    . 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 Normal uric acid serum level at time of attack.    Marland Kitchen HERNIA, HIATAL, NONCONGENITAL 04/27/2006   Qualifier: Diagnosis of  By: McDiarmid MD, Sherren Mocha    . High risk medications (not anticoagulants) long-term use 03/05/2012  . History of Hemorrhoids 04/27/2006   Qualifier: Diagnosis of  By: McDiarmid MD, Sherren Mocha    . Hx of colonoscopy with polypectomy 04/27/2010   Dr Cristina Gong found three  tubular adenomas each less than 10 mm size  . Hypertension   . Iron deficiency anemia 08/05/2010   Dr Cristina Gong (GI) has evaluated with EGD, colonoscopy, and video capsular endoscopy in 2011 & 2012.  All have been unrevealing as to an origin of IDA.  OV with Dr Cristina Gong (10/28/10) assessment of blood in stool per hemoccult and GER. Hbg 12.1 g/dL, MCV 91.8, Ferritin 30 ng/mL. Patient taking on ferrous sulfate tab daily.   EGD on 03/06/12 by Dr Cristina Gong for IDA non-obstructing Schatzki's ring at Gastroesophageal junction, otherwise normal esophagus and stomach.     . Leg cramps 05/29/2012  . Lumbar herniated disc    History of HNP L4/5 in 2003  . Macular degeneration, bilateral 10/04/2010   Right eye is wet MD, the other is dry macular degeneration (ARMD). Pt undergoing some form of vascular endothelial growth factor inhibition intraocular therapy.    . Mild cognitive impairment 10/26/2012   (10/25/12) Failed MiniCog screen  . MUSCLE CRAMPS 03/11/2010   Qualifier:  Diagnosis of  By: McDiarmid MD, Sherren Mocha    . Muscle spasm of back 09/24/2013  . Myocardial infarct, old   . Numbness and tingling in hands 07/21/2011  . Pacemaker    MDT  . PREDIABETES 09/11/2007   Qualifier: Diagnosis of  By: McDiarmid MD, Sherren Mocha    . Retinal hemorrhage of left eye 06/2010  . RHINITIS, ALLERGIC 04/27/2006   Qualifier: Diagnosis of  By: McDiarmid MD, Sherren Mocha    . SCHATZKI'S RING, HX OF 11/26/2007   Qualifier: Diagnosis of  By: McDiarmid MD, Sherren Mocha  An EGD was performed by Dr Cristina Gong on 04/27/2010 for iron deficiency anemia. There was a a transient hiatal hernia with Schatzki's ring. Stomach and duodenum were normal. EGD on 03/06/12 by Dr Cristina Gong for IDA non-obstructing Schatzki's ring at Gastroesophageal junction, otherwise normal esophagus and stomach.    . Seborrheic keratosis, right anterior thigh 12/12/2013  . Shoulder pain, left 01/09/2014  . Sick sinus syndrome with tachycardia ([place])    MDT  . Soft tissue injury of foot 05/03/2011  . Solar lentigo 06/15/2012  . Solitary kidney, acquired 05/17/2010  Surgical removal for transitional cell cancer by Tresa Endo, MD ([place]). Surveillance cystoscopy by Dr Alinda Money [facility]) on 10/19/12 without evidence of cystoscopic recurrence. Recommend RTC one year for cystoscopy.   Marland Kitchen Spinal stenosis, lumbar   . Transitional cell carcinoma of ureter, history   . Urge incontinence 12/13/2011   Diagnosed in 10/2011 by Dr Bjorn Loser (Urology)   . VENTRICULAR HYPERTROPHY, LEFT 08/28/2008   Qualifier: Diagnosis of  By: McDiarmid MD, Sherren Mocha    . VITAMIN B12 DEFICIENCY 10/07/2009   Qualifier: Diagnosis of  By: McDiarmid MD, Sherren Mocha  Dx based on a post-TKR anemia work-up Low normal serum B12 with high Methylmalonic acid and homocysteine level  Vit B12 serum level (10/28/10) > 1500 pg/mL   . Vitamin D deficiency 11/02/2010   Serum vitamin D 25(OH) = 10.9 ng/mL (30 -100) on 10/28/10 c/w Vitamin D deficiency.       Past Surgical History:  Procedure Laterality  Date  . BREAST SURGERY     breast reduction  . CHOLECYSTECTOMY    . CORONARY ANGIOPLASTY WITH STENT PLACEMENT  2010   BMS RCA, OM2 occluded  . CYSTOSCOPY  09/30/2015   No cystoscopic evidence of uroepithelial neoplasm.  Follow up surveillance cystoscopy in one year  . DUPUYTREN CONTRACTURE RELEASE Right 05/22/2014   Procedure: DUPUYTREN RELEASE AND REPAIR AS NECESSARY RIGHT RING FINGER AND MIDDLE FINGER;  Surgeon: Roseanne Kaufman, MD;  Location: [facility];  Service: Orthopedics;  Laterality: Right;  . ESOPHAGOGASTRODUODENOSCOPY  04/27/2010   Dr Cristina Gong - found transient H/H & Schatzki's ring  . ESOPHAGOGASTRODUODENOSCOPY ENDOSCOPY  03/06/2012   Dr Cristina Gong - found non-obstuctive Schatzki's ring at [employer] o/w normal EGD.   Marland Kitchen EYE SURGERY     bilateral cataracts  . KNEE ARTHROSCOPY W/ SYNOVECTOMY  11/2009, left knee   Dr Wynelle Link  . NEPHRECTOMY  For transition cell cancer    Dr Tresa Endo, surgeon  . PACEMAKER GENERATOR CHANGE N/A 11/12/2012   Procedure: PACEMAKER GENERATOR CHANGE;  Surgeon: Evans Lance, MD; [employer] [facility] [place] dual-chamber pacemaker serial number [id number]; Laterality: Right  . PACEMAKER INSERTION  2005   Dr Doreatha Lew  . PACEMAKER LEAD REMOVAL  2005   Removal and reinsertion of atrial and ventricular leads due to migration  . REPLACEMENT TOTAL KNEE  2009, Right knee   Dr Wynelle Link  . REPLACEMENT TOTAL KNEE  05/2009, Left knee   Dr Wynelle Link  . TONSILLECTOMY    . TRIGGER FINGER RELEASE Right 05/22/2014   Procedure: RIGHT HAND A-1 PULLEY RELEASE ;  Surgeon: Roseanne Kaufman, MD;  Location: [facility];  Service: Orthopedics;  Laterality: Right;    Social History:  reports that she quit smoking about 6 years ago. Her smoking use included Cigarettes. She smoked 1.00 pack per day. She has never used smokeless tobacco. She reports that she drinks about 12.0 oz of alcohol per week . She reports that she does not use drugs.  Family History:  Family History  Problem Relation Age of Onset  .  Dementia Mother   . Heart disease Father      Prior to Admission medications   Medication Sig Start Date End Date Taking? Authorizing Provider  amiodarone (PACERONE) 200 MG tablet Take 1 tablet (200 mg total) by mouth daily. 05/18/15   Evans Lance, MD  apixaban (ELIQUIS) 2.5 MG TABS tablet Take 1 tablet (2.5 mg total) by mouth 2 (two) times daily. 05/18/15   Blane Ohara McDiarmid, MD  atorvastatin (LIPITOR) 20 MG tablet Take 1  tablet (20 mg total) by mouth daily. 04/17/15   Evans Lance, MD  cholecalciferol (VITAMIN D) 400 units TABS tablet Take 400 Units by mouth daily.    Historical Provider, MD  diclofenac sodium (VOLTAREN) 1 % GEL Apply 2 g topically 4 (four) times daily. 05/21/15   Nicolette Bang, DO  docusate sodium (COLACE) 100 MG capsule Take 100 mg by mouth daily as needed for mild constipation. Reported on 07/08/2015    Historical Provider, MD  Fluticasone-Salmeterol (ADVAIR DISKUS) 250-50 MCG/DOSE AEPB Inhale 2 puffs into the lungs 2 (two) times daily. 03/05/15   Mercy Riding, MD  furosemide (LASIX) 40 MG tablet Take 0.5 tablets (20 mg total) by mouth daily. 07/08/15   Rhonda G Barrett, PA-C  Melatonin 3 MG TABS Take 3 mg by mouth at bedtime as needed (sleep).    Historical Provider, MD  metoprolol succinate (TOPROL-XL) 25 MG 24 hr tablet Take 1 tablet (25 mg total) by mouth daily. 09/14/15   Belva Crome, MD  nitroGLYCERIN (NITROSTAT) 0.4 MG SL tablet Place 0.4 mg under the tongue every 5 (five) minutes as needed for chest pain (x 3 tabs).    Historical Provider, MD  omeprazole (PRILOSEC) 20 MG capsule Take 1 capsule (20 mg total) by mouth daily. 11/07/14   Blane Ohara McDiarmid, MD  polyethylene glycol (MIRALAX / Floria Raveling) packet MIX 1 PACKET AND TK PO BID 06/15/15   Historical Provider, MD  potassium chloride (K-DUR,KLOR-CON) 10 MEQ tablet Take 1 tablet (10 mEq total) by mouth daily. 06/30/15   Eileen Stanford, PA-C  Sennosides 25 MG TABS Take 2 tablets (50 mg total) by mouth daily as  needed. 10/16/15   Blane Ohara McDiarmid, MD  tiotropium (SPIRIVA) 18 MCG inhalation capsule Place 1 capsule (18 mcg total) into inhaler and inhale daily. 01/15/15   Blane Ohara McDiarmid, MD  traMADol (ULTRAM) 50 MG tablet Take 1 tablet (50 mg total) by mouth every 6 (six) hours as needed for moderate pain. 11/17/14   Sela Hua, MD  VENTOLIN HFA 108 [id number] Base) MCG/ACT inhaler INHALE 2 PUFFS WITH SPACER EVERY 4 HOURS AS NEEDED FOR WHEEZING 09/14/15   Blane Ohara McDiarmid, MD    Physical Exam: Vitals:   11/08/15 2053 11/08/15 2115 11/08/15 2130 11/08/15 2228  BP: 147/63 119/63 130/56   Pulse: 62 64 61   Resp: 19 20 15    Temp: 97.6 F (36.4 C)     TempSrc: Oral     SpO2: 94% 95% 93% (!) 88%  Weight: 72.1 kg (159 lb)     Height: 5\' 5"  (1.651 m)      General: Not in acute distress HEENT:       Eyes: PERRL, EOMI, no scleral icterus.       ENT: No discharge from the ears and nose, no pharynx injection, no tonsillar enlargement.        Neck: No JVD, no bruit, no mass felt. Heme: No neck lymph node enlargement. Cardiac: S1/S2, RRR, No murmurs, No gallops or rubs. Respiratory: has wheezing bilaterally. No rales or rubs. GI: Soft, nondistended, nontender, no rebound pain, no organomegaly, BS present. GU: No hematuria Ext: has trace leg edema bilaterally. 2+DP/PT pulse bilaterally. Musculoskeletal: No joint deformities, No joint redness or warmth, no limitation of ROM in spin. Skin: No rashes.  Neuro: Alert, oriented X3, cranial nerves II-XII grossly intact, moves all extremities normally.  Psych: Patient is not psychotic, no suicidal or hemocidal ideation.  Labs on Admission: I  have personally reviewed following labs and imaging studies  CBC:  Recent Labs Lab 11/08/15 1942  WBC 8.8  HGB 11.5*  HCT 36.7  MCV 97.9  PLT 694   Basic Metabolic Panel:  Recent Labs Lab 11/08/15 1942  NA 134*  K 4.1  CL 101  CO2 24  GLUCOSE 122*  BUN 24*  CREATININE 1.73*  CALCIUM 9.1   GFR: Estimated  Creatinine Clearance: 21.9 mL/min (by C-G formula based on SCr of 1.73 mg/dL). Liver Function Tests: No results for input(s): AST, ALT, ALKPHOS, BILITOT, PROT, ALBUMIN in the last 168 hours. No results for input(s): LIPASE, AMYLASE in the last 168 hours. No results for input(s): AMMONIA in the last 168 hours. Coagulation Profile: No results for input(s): INR, PROTIME in the last 168 hours. Cardiac Enzymes: No results for input(s): CKTOTAL, CKMB, CKMBINDEX, TROPONINI in the last 168 hours. BNP (last 3 results) No results for input(s): PROBNP in the last 8760 hours. HbA1C: No results for input(s): HGBA1C in the last 72 hours. CBG: No results for input(s): GLUCAP in the last 168 hours. Lipid Profile: No results for input(s): CHOL, HDL, LDLCALC, TRIG, CHOLHDL, LDLDIRECT in the last 72 hours. Thyroid Function Tests: No results for input(s): TSH, T4TOTAL, FREET4, T3FREE, THYROIDAB in the last 72 hours. Anemia Panel: No results for input(s): VITAMINB12, FOLATE, FERRITIN, TIBC, IRON, RETICCTPCT in the last 72 hours. Urine analysis:    Component Value Date/Time   COLORURINE YELLOW 06/13/2015 2238   APPEARANCEUR CLOUDY (A) 06/13/2015 2238   LABSPEC 1.012 06/13/2015 2238   PHURINE 5.5 06/13/2015 2238   GLUCOSEU NEGATIVE 06/13/2015 2238   HGBUR NEGATIVE 06/13/2015 2238   HGBUR negative 03/19/2007 1404   BILIRUBINUR NEGATIVE 06/13/2015 2238   KETONESUR NEGATIVE 06/13/2015 2238   PROTEINUR NEGATIVE 06/13/2015 2238   UROBILINOGEN 0.2 05/01/2014 1345   NITRITE NEGATIVE 06/13/2015 2238   LEUKOCYTESUR NEGATIVE 06/13/2015 2238   Sepsis Labs: @LABRCNTIP (procalcitonin:4,lacticidven:4) )No results found for this or any previous visit (from the past 240 hour(s)).   Radiological Exams on Admission: Dg Chest 2 View  Result Date: 11/08/2015 CLINICAL DATA:  Cough. EXAM: CHEST  2 VIEW COMPARISON:  Multiple chest x-rays since 2014. FINDINGS: The focal opacity in the left mid lung, just lateral to the  hilum, has been present since 2014 consistent with a chronic etiology, likely scarring. There is atelectasis or scar in the left retrocardiac region/base, unchanged. No pneumothorax. Stable pacemaker. Stable cardiomediastinal silhouette. No other interval changes or acute abnormalities. IMPRESSION: No acute change. Electronically Signed   By: Dorise Bullion III M.D   On: 11/08/2015 17:49     EKG: Independently reviewed.  Paced rhythm, QTC 463   Assessment/Plan Principal Problem:   Acute on chronic respiratory failure with hypoxia (HCC) Active Problems:   Chronic kidney disease (CKD), stage IV (severe) (HCC)   AF (paroxysmal atrial fibrillation) (HCC)   Chronic anticoagulation   Pacemaker   CAD S/P RCA BMS, residual CFX disease   Chronic diastolic CHF (congestive heart failure) (HCC)   GERD (gastroesophageal reflux disease)   COPD exacerbation (HCC)   Acute on chronic respiratory failure with hypoxia due to COPD: Patient's productive cough and shortness of breath, plus chest x-ray findings of wheezing, consistent with COPD exacerbation. Chest x-ray is negative for infiltration.  -will place on telemetry bed for obs -Nebulizers: scheduled Duoneb and prn albuterol -Solu-Medrol 60 mg IV tid -Oral doxycycline for 5 days.  -Mucinex for cough  -Urine S. pneumococcal antigen -Follow up blood culture x2,  sputum culture, respiratory virus panel -check ABG  Chronic kidney disease (CKD), stage IV (severe) ([place]): stable. Baseline creatinine 1.5-1.7. Her creatinine is 1.74, BUN 24, close to baseline. -Follow-up renal function by BMP   Atrial Fibrillation: CHA2DS2-VASc Score is 6, needs oral anticoagulation. Patient is Eliquis at home. Heart rate is well controlled. -continue Eliquis -continue amiodarone, metoprolol  Chronic diastolic CHF (congestive heart failure) ([place]): 2-D Echo on 02/26/15 showed EF 55-60% with grade 2 diastolic dysfunction. Patient has trace leg edema, no JVD, CHF  compensated. -Continue low-dose Lasix 20 mg daily -Continue metoprolol  CAD S/P RCA BMS, residual CFX disease: No CP -continue metoprolol and when necessary nitroglycerin  GERD: -Protonix  DVT ppx: On Eliquis Code Status: Full code Family Communication: None at bed side.   Disposition Plan:  Anticipate discharge back to previous home environment Consults called:  None Admission status: Obs / tele         Date of Service 11/08/2015    Ivor Costa [facility] Pager [phone]  If 7PM-7AM, please contact night-coverage [email or web address] Password [facility] 11/08/2015, 11:33 PM

## 2015-11-08 NOTE — ED Notes (Signed)
Pt's O2 levels dropped to 88% after ambulation w/ severe SOB.

## 2015-11-08 NOTE — ED Provider Notes (Signed)
[facility] DEPT Provider Note   CSN: [id number] Arrival date & time: 11/08/15  1919     History   Chief Complaint Chief Complaint  Patient presents with  . Shortness of Breath  . Cough    HPI [name] is a 82 y.o. female.  HPI 82 year old female who presents with shortness of breath and cough. She has a history of COPD, not on chronic oxygen, stage IV chronic kidney disease, paroxysmal atrial fibrillation on Eliquis, diastolic heart failure, sick sinus syndrome status post pacemaker placement, and CAD. He states that over the past 2 weeks she has been having increasing shortness of breath with dyspnea on exertion. She was started on azithromycin by her pulmonologist and had follow-up 5 days ago with some improvement in her symptoms. States that the next day she began to develop a productive cough with worsening shortness of breath and dyspnea on exertion. States that just going up the stairs in her home she begins to be very winded and fatigued. Denies any chest pain, syncope, orthopnea, PND, or lower extremity edema. Was seen in urgent care initially and given 2 breathing treatments with IM Solu-Medrol. Only minimal improvement and was sent to the ED for further treatment. Past Medical History:  Diagnosis Date  . Abnormal mammogram, unspecified 08/23/2010   Followup imaging reassuring.   . Acute appendicitis with rupture   . ADENOMATOUS COLONIC POLYP 03/01/2003   Qualifier: Diagnosis of  By: McDiarmid MD, Sherren Mocha Multiple benign polyps of cecum, ascending, transverse and sigmoid colon by 8/09 colonoscopy by Dr Cristina Gong  Colonoscopy by Dr Cristina Gong for iron-deficiency anemia on 04/27/2010 showed three sessile polyps that were in ascending (3 mm x 9 mm), transverse (4 mm), and cecum (3 mm).  All three were tubular adenomas that were negative for high grade dysplasia or malignancy on pathology. Dr Cristina Gong called the polpys benign and not requiring follow-up in view of the patients age.    .  Adrenal adenoma    Incidentaloma  . AF (paroxysmal atrial fibrillation) ([place]) 11/11/2010   Hospitalization (9/8-9/10, Dr Daneen Schick, III, Cardiology) for Paroxysmal Atrial Fibrillation with RVR and anginal pain secondary to demand/supply mismatch in setting of RVR with known circumflex artery branch disease.    Marland Kitchen ANXIETY 04/27/2006   Qualifier: Diagnosis of  By: McDiarmid MD, Sherren Mocha    . Candidiasis of the esophagus 11/26/2007  . CAP (community acquired pneumonia) 02/25/2015  . Cataract 2013   Bilateral   . Cellulitis of leg, right 10/01/2012  . Cellulitis of right lower extremity   . Chest pain with moderate risk of acute coronary syndrome 05/21/2015  . Chronic kidney disease (CKD), stage III (moderate)   . Concussion with loss of consciousness   . COPD 04/27/2006   Qualifier: Diagnosis of  By: McDiarmid MD, Sherren Mocha    . COPD exacerbation ([place])   . COPD, severe   . Decreased functional mobility and endurance 03/17/2015  . Diastolic heart failure ([place]) 07/30/2015  . DISC WITH RADICULOPATHY 04/27/2006   Qualifier: Diagnosis of  By: McDiarmid MD, Sherren Mocha    . EDEMA-LEGS,DUE TO VENOUS OBSTRUCT. 04/27/2006   Qualifier: Diagnosis of  By: McDiarmid MD, Sherren Mocha    . 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 Normal uric acid serum level at time of attack.    Marland Kitchen HERNIA, HIATAL, NONCONGENITAL 04/27/2006   Qualifier: Diagnosis of  By: McDiarmid MD, Sherren Mocha    .  High risk medications (not anticoagulants) long-term use 03/05/2012  . History of Hemorrhoids 04/27/2006   Qualifier: Diagnosis of  By: McDiarmid MD, Sherren Mocha    . Hx of colonoscopy with polypectomy 04/27/2010   Dr Cristina Gong found three  tubular adenomas each less than 10 mm size  . Hypertension   . Iron deficiency anemia 08/05/2010   Dr Cristina Gong (GI) has evaluated with EGD, colonoscopy, and video capsular endoscopy in 2011 & 2012.  All have been unrevealing as to an origin of IDA.  OV with Dr  Cristina Gong (10/28/10) assessment of blood in stool per hemoccult and GER. Hbg 12.1 g/dL, MCV 91.8, Ferritin 30 ng/mL. Patient taking on ferrous sulfate tab daily.   EGD on 03/06/12 by Dr Cristina Gong for IDA non-obstructing Schatzki's ring at Gastroesophageal junction, otherwise normal esophagus and stomach.     . Leg cramps 05/29/2012  . Lumbar herniated disc    History of HNP L4/5 in 2003  . Macular degeneration, bilateral 10/04/2010   Right eye is wet MD, the other is dry macular degeneration (ARMD). Pt undergoing some form of vascular endothelial growth factor inhibition intraocular therapy.    . Mild cognitive impairment 10/26/2012   (10/25/12) Failed MiniCog screen  . MUSCLE CRAMPS 03/11/2010   Qualifier: Diagnosis of  By: McDiarmid MD, Sherren Mocha    . Muscle spasm of back 09/24/2013  . Myocardial infarct, old   . Numbness and tingling in hands 07/21/2011  . Pacemaker    MDT  . PREDIABETES 09/11/2007   Qualifier: Diagnosis of  By: McDiarmid MD, Sherren Mocha    . Retinal hemorrhage of left eye 06/2010  . RHINITIS, ALLERGIC 04/27/2006   Qualifier: Diagnosis of  By: McDiarmid MD, Sherren Mocha    . SCHATZKI'S RING, HX OF 11/26/2007   Qualifier: Diagnosis of  By: McDiarmid MD, Sherren Mocha  An EGD was performed by Dr Cristina Gong on 04/27/2010 for iron deficiency anemia. There was a a transient hiatal hernia with Schatzki's ring. Stomach and duodenum were normal. EGD on 03/06/12 by Dr Cristina Gong for IDA non-obstructing Schatzki's ring at Gastroesophageal junction, otherwise normal esophagus and stomach.    . Seborrheic keratosis, right anterior thigh 12/12/2013  . Shoulder pain, left 01/09/2014  . Sick sinus syndrome with tachycardia ([place])    MDT  . Soft tissue injury of foot 05/03/2011  . Solar lentigo 06/15/2012  . Solitary kidney, acquired 05/17/2010   Surgical removal for transitional cell cancer by Tresa Endo, MD ([place]). Surveillance cystoscopy by Dr Alinda Money [facility]) on 10/19/12 without evidence of cystoscopic recurrence. Recommend RTC  one year for cystoscopy.   Marland Kitchen Spinal stenosis, lumbar   . Transitional cell carcinoma of ureter, history   . Urge incontinence 12/13/2011   Diagnosed in 10/2011 by Dr Bjorn Loser (Urology)   . VENTRICULAR HYPERTROPHY, LEFT 08/28/2008   Qualifier: Diagnosis of  By: McDiarmid MD, Sherren Mocha    . VITAMIN B12 DEFICIENCY 10/07/2009   Qualifier: Diagnosis of  By: McDiarmid MD, Sherren Mocha  Dx based on a post-TKR anemia work-up Low normal serum B12 with high Methylmalonic acid and homocysteine level  Vit B12 serum level (10/28/10) > 1500 pg/mL   . Vitamin D deficiency 11/02/2010   Serum vitamin D 25(OH) = 10.9 ng/mL (30 -100) on 10/28/10 c/w Vitamin D deficiency.       Patient Active Problem List   Diagnosis Date Noted  . Hearing loss 10/16/2015  . On amiodarone therapy 09/14/2015  . Diastolic heart failure ([place]) 07/30/2015  . Hypotension 07/09/2015  . Paroxysmal atrial fibrillation (  HCC)   . Diastolic dysfunction   . History of iron deficiency 01/16/2015  . Pure hypercholesterolemia 07/31/2014  . CAD S/P RCA BMS, residual CFX disease 06/08/2014  . Pacemaker 04/02/2013  . Mild cognitive impairment 10/26/2012  . Transitional cell carcinoma of ureter, history   . Chronic anticoagulation 10/01/2012  . Urge incontinence 12/13/2011  . AF (paroxysmal atrial fibrillation) ([place]) 11/11/2010  . Vitamin D deficiency 11/02/2010  . Macular degeneration, bilateral 10/04/2010    Class: Chronic  . Solitary kidney, acquired 05/17/2010  . Spondylolisthesis of lumbar region   . VITAMIN B12 DEFICIENCY 10/07/2009  . Chronic kidney disease (CKD), stage IV (severe) ([place]) 09/02/2009  . MYOCARDIAL INFARCTION, HX OF 09/25/2008  . VENTRICULAR HYPERTROPHY, LEFT 08/28/2008  . At risk for diabetes mellitus 09/11/2007  . ANXIETY 04/27/2006  . SICK SINUS SYNDROME 04/27/2006  . COPD, severe ([place]) 04/27/2006  . GASTROESOPHAGEAL REFLUX, NO ESOPHAGITIS 04/27/2006  . Osteoarthrosis involving lower leg 04/27/2006  . OSTEOARTHRITIS  OF SPINE, NOS 04/27/2006  . LUMBAR SPINAL STENOSIS 04/27/2006    Past Surgical History:  Procedure Laterality Date  . BREAST SURGERY     breast reduction  . CHOLECYSTECTOMY    . CORONARY ANGIOPLASTY WITH STENT PLACEMENT  2010   BMS RCA, OM2 occluded  . CYSTOSCOPY  09/30/2015   No cystoscopic evidence of uroepithelial neoplasm.  Follow up surveillance cystoscopy in one year  . DUPUYTREN CONTRACTURE RELEASE Right 05/22/2014   Procedure: DUPUYTREN RELEASE AND REPAIR AS NECESSARY RIGHT RING FINGER AND MIDDLE FINGER;  Surgeon: Roseanne Kaufman, MD;  Location: [facility];  Service: Orthopedics;  Laterality: Right;  . ESOPHAGOGASTRODUODENOSCOPY  04/27/2010   Dr Cristina Gong - found transient H/H & Schatzki's ring  . ESOPHAGOGASTRODUODENOSCOPY ENDOSCOPY  03/06/2012   Dr Cristina Gong - found non-obstuctive Schatzki's ring at [employer] [facility] o/w normal EGD.   Marland Kitchen EYE SURGERY     bilateral cataracts  . KNEE ARTHROSCOPY W/ SYNOVECTOMY  11/2009, left knee   Dr Wynelle Link  . NEPHRECTOMY  For transition cell cancer    Dr Tresa Endo, surgeon  . PACEMAKER GENERATOR CHANGE N/A 11/12/2012   Procedure: PACEMAKER GENERATOR CHANGE;  Surgeon: Evans Lance, MD; [employer] [facility] [employer] dual-chamber pacemaker serial number [id number]; Laterality: Right  . PACEMAKER INSERTION  2005   Dr Doreatha Lew  . PACEMAKER LEAD REMOVAL  2005   Removal and reinsertion of atrial and ventricular leads due to migration  . REPLACEMENT TOTAL KNEE  2009, Right knee   Dr Wynelle Link  . REPLACEMENT TOTAL KNEE  05/2009, Left knee   Dr Wynelle Link  . TONSILLECTOMY    . TRIGGER FINGER RELEASE Right 05/22/2014   Procedure: RIGHT HAND A-1 PULLEY RELEASE ;  Surgeon: Roseanne Kaufman, MD;  Location: [facility];  Service: Orthopedics;  Laterality: Right;    OB History    No data available       Home Medications    Prior to Admission medications   Medication Sig Start Date End Date Taking? Authorizing Provider  amiodarone (PACERONE) 200 MG tablet Take 1 tablet (200 mg total) by  mouth daily. 05/18/15   Evans Lance, MD  apixaban (ELIQUIS) 2.5 MG TABS tablet Take 1 tablet (2.5 mg total) by mouth 2 (two) times daily. 05/18/15   Blane Ohara McDiarmid, MD  atorvastatin (LIPITOR) 20 MG tablet Take 1 tablet (20 mg total) by mouth daily. 04/17/15   Evans Lance, MD  cholecalciferol (VITAMIN D) 400 units TABS tablet Take 400 Units by mouth daily.    Historical Provider,  MD  diclofenac sodium (VOLTAREN) 1 % GEL Apply 2 g topically 4 (four) times daily. 05/21/15   Nicolette Bang, DO  docusate sodium (COLACE) 100 MG capsule Take 100 mg by mouth daily as needed for mild constipation. Reported on 07/08/2015    Historical Provider, MD  Fluticasone-Salmeterol (ADVAIR DISKUS) 250-50 MCG/DOSE AEPB Inhale 2 puffs into the lungs 2 (two) times daily. 03/05/15   Mercy Riding, MD  furosemide (LASIX) 40 MG tablet Take 0.5 tablets (20 mg total) by mouth daily. 07/08/15   Rhonda G Barrett, PA-C  Melatonin 3 MG TABS Take 3 mg by mouth at bedtime as needed (sleep).    Historical Provider, MD  metoprolol succinate (TOPROL-XL) 25 MG 24 hr tablet Take 1 tablet (25 mg total) by mouth daily. 09/14/15   Belva Crome, MD  nitroGLYCERIN (NITROSTAT) 0.4 MG SL tablet Place 0.4 mg under the tongue every 5 (five) minutes as needed for chest pain (x 3 tabs).    Historical Provider, MD  omeprazole (PRILOSEC) 20 MG capsule Take 1 capsule (20 mg total) by mouth daily. 11/07/14   Blane Ohara McDiarmid, MD  polyethylene glycol (MIRALAX / Floria Raveling) packet MIX 1 PACKET AND TK PO BID 06/15/15   Historical Provider, MD  potassium chloride (K-DUR,KLOR-CON) 10 MEQ tablet Take 1 tablet (10 mEq total) by mouth daily. 06/30/15   Eileen Stanford, PA-C  Sennosides 25 MG TABS Take 2 tablets (50 mg total) by mouth daily as needed. 10/16/15   Blane Ohara McDiarmid, MD  tiotropium (SPIRIVA) 18 MCG inhalation capsule Place 1 capsule (18 mcg total) into inhaler and inhale daily. 01/15/15   Blane Ohara McDiarmid, MD  traMADol (ULTRAM) 50 MG tablet Take  1 tablet (50 mg total) by mouth every 6 (six) hours as needed for moderate pain. 11/17/14   Sela Hua, MD  VENTOLIN HFA 108 [id number] Base) MCG/ACT inhaler INHALE 2 PUFFS WITH SPACER EVERY 4 HOURS AS NEEDED FOR WHEEZING 09/14/15   Blane Ohara McDiarmid, MD    Family History Family History  Problem Relation Age of Onset  . Dementia Mother   . Heart disease Father     Social History Social History  Substance Use Topics  . Smoking status: Former Smoker    Packs/day: 1.00    Types: Cigarettes    Quit date: 03/02/2009  . Smokeless tobacco: Never Used  . Alcohol use 12.0 oz/week    14 Glasses of wine, 6 Standard drinks or equivalent per week     Comment: 4-5 glasses of wine per week     Allergies   Baclofen; Codeine phosphate; Norco [hydrocodone-acetaminophen]; Hydrochlorothiazide; and Montelukast sodium   Review of Systems Review of Systems 10/14 systems reviewed and are negative other than those stated in the HPI   Physical Exam Updated Vital Signs BP 130/56   Pulse 61   Temp 97.6 F (36.4 C) (Oral)   Resp 15   Ht 5\' 5"  (1.651 m)   Wt 159 lb (72.1 kg)   SpO2 93%   BMI 26.46 kg/m   Physical Exam Physical Exam  Nursing note and vitals reviewed. Constitutional: non-toxic, and in no acute distress Head: Normocephalic and atraumatic.  Mouth/Throat: Oropharynx is clear and moist.  Neck: Normal range of motion. Neck supple.  Cardiovascular: Normal rate and regular rhythm.   no lower extremity edema. Pulmonary/Chest: Effort normal. No conversational dyspnea. Prolonged expiratory phase with expiratory wheezes on the right lung fields.  Abdominal: Soft. There is no tenderness. There is  no rebound and no guarding.  Musculoskeletal: Normal range of motion.  Neurological: Alert, no facial droop, fluent speech, moves all extremities symmetrically Skin: Skin is warm and dry.  Psychiatric: Cooperative   ED Treatments / Results  Labs (all labs ordered are listed, but only abnormal  results are displayed) Labs Reviewed  BASIC METABOLIC PANEL - Abnormal; Notable for the following:       Result Value   Sodium 134 (*)    Glucose, Bld 122 (*)    BUN 24 (*)    Creatinine, Ser 1.73 (*)    GFR calc non Af Amer 25 (*)    GFR calc Af Amer 29 (*)    All other components within normal limits  CBC - Abnormal; Notable for the following:    RBC 3.75 (*)    Hemoglobin 11.5 (*)    All other components within normal limits  BRAIN NATRIURETIC PEPTIDE  I-STAT TROPOININ, ED    EKG  EKG Interpretation  Date/Time:  Sunday November 08 2015 20:53:14 EDT Ventricular Rate:  64 PR Interval:    QRS Duration: 135 QT Interval:  438 QTC Calculation: 452 R Axis:   -23 Text Interpretation:  Atrial-paced rhythm IVCD, consider atypical LBBB Similar to prior EKG  Confirmed by LIU MD, DANA [phone]) on 11/08/2015 9:13:32 PM       Radiology Dg Chest 2 View  Result Date: 11/08/2015 CLINICAL DATA:  Cough. EXAM: CHEST  2 VIEW COMPARISON:  Multiple chest x-rays since 2014. FINDINGS: The focal opacity in the left mid lung, just lateral to the hilum, has been present since 2014 consistent with a chronic etiology, likely scarring. There is atelectasis or scar in the left retrocardiac region/base, unchanged. No pneumothorax. Stable pacemaker. Stable cardiomediastinal silhouette. No other interval changes or acute abnormalities. IMPRESSION: No acute change. Electronically Signed   By: Dorise Bullion III M.D   On: 11/08/2015 17:49    Procedures Procedures (including critical care time)  Medications Ordered in ED Medications  ipratropium-albuterol (DUONEB) 0.5-2.5 (3) MG/3ML nebulizer solution 3 mL (3 mLs Nebulization Given 11/08/15 2142)     Initial Impression / Assessment and Plan / ED Course  I have reviewed the triage vital signs and the nursing notes.  Pertinent labs & imaging results that were available during my care of the patient were reviewed by me and considered in my medical  decision making (see chart for details).  Clinical Course    Presenting with copd exacerbation. At rest without increased work of breathing and no conversational dyspnea. Does have low pulse ox in the low 90 percentile and expiratory wheezes on exam. Given additional breathing treatments here, but still very symptomatic with minimal ambulation and mild hypoxia. Received steroids to urgent care. Chest x-ray there reviewed and showed no infiltrate or other acute processes. Appears euvolemic and without symptoms of CHF. No chest pain with nonischemic EKG and normal troponin. Given that she is still very symptomatic well admitted overnight. Discussed with Dr. Blaine Hamper.  Final Clinical Impressions(s) / ED Diagnoses   Final diagnoses:  None    New Prescriptions New Prescriptions   No medications on file     Forde Dandy, MD 11/08/15 2317

## 2015-11-08 NOTE — Discharge Instructions (Signed)
To go straight to the emergency department for additional evaluation and management of respiratory distress, COPD exacerbation without improvement after 2 nebulizers and Solu-Medrol in the urgent care.

## 2015-11-08 NOTE — ED Triage Notes (Signed)
Patient reports sob with activity.  Patient reports walking steps in her home, she has to step several times to catch her breath and this is unusual.  Denies pain.  Patient has a productive cough that leaves her sore when coughing.  Phlegm has been yellow, now white.  Patient denies fever.    Patient has been traveling through [place] recently.  Returned this past week to see pulmonologist for a routine appt.  appt was Tuesday 9/5.  Patient reports her doctor said she was fine.  Patient did not have these complaints at this appt.    Patient speaking in complete sentences.

## 2015-11-08 NOTE — ED Notes (Signed)
Patient transported to X-ray 

## 2015-11-08 NOTE — ED Provider Notes (Signed)
CSN: [id number]     Arrival date & time 11/08/15  1622 History   First MD Initiated Contact with Patient 11/08/15 1714     Chief Complaint  Patient presents with  . Wheezing  . COPD   (Consider location/radiation/quality/duration/timing/severity/associated sxs/prior Treatment) 82 year old female complaining of a COPD flareup for 2 days. She states is being getting worse. Her shortness of breath has increased and her activity level has decreased. She is complaining of DOE that is worse than any time she can remember. She has used her albuterol inhaler once this morning. Denies chest pain.  Past medical history includes severe COPD, stage IV chronic renal disease, history of atrial fibrillation, diastolic heart failure, sick sinus syndrome with tachycardia, ventricular hypertrophy, CAD among many others.      Past Medical History:  Diagnosis Date  . Abnormal mammogram, unspecified 08/23/2010   Followup imaging reassuring.   . Acute appendicitis with rupture   . ADENOMATOUS COLONIC POLYP 03/01/2003   Qualifier: Diagnosis of  By: McDiarmid MD, Sherren Mocha Multiple benign polyps of cecum, ascending, transverse and sigmoid colon by 8/09 colonoscopy by Dr Cristina Gong  Colonoscopy by Dr Cristina Gong for iron-deficiency anemia on 04/27/2010 showed three sessile polyps that were in ascending (3 mm x 9 mm), transverse (4 mm), and cecum (3 mm).  All three were tubular adenomas that were negative for high grade dysplasia or malignancy on pathology. Dr Cristina Gong called the polpys benign and not requiring follow-up in view of the patients age.    . Adrenal adenoma    Incidentaloma  . AF (paroxysmal atrial fibrillation) ([place]) 11/11/2010   Hospitalization (9/8-9/10, Dr Daneen Schick, III, Cardiology) for Paroxysmal Atrial Fibrillation with RVR and anginal pain secondary to demand/supply mismatch in setting of RVR with known circumflex artery branch disease.    Marland Kitchen ANXIETY 04/27/2006   Qualifier: Diagnosis of  By: McDiarmid MD, Sherren Mocha     . Candidiasis of the esophagus 11/26/2007  . CAP (community acquired pneumonia) 02/25/2015  . Cataract 2013   Bilateral   . Cellulitis of leg, right 10/01/2012  . Cellulitis of right lower extremity   . Chest pain with moderate risk of acute coronary syndrome 05/21/2015  . Chronic kidney disease (CKD), stage III (moderate)   . Concussion with loss of consciousness   . COPD 04/27/2006   Qualifier: Diagnosis of  By: McDiarmid MD, Sherren Mocha    . COPD exacerbation ([place])   . COPD, severe   . Decreased functional mobility and endurance 03/17/2015  . Diastolic heart failure ([place]) 07/30/2015  . DISC WITH RADICULOPATHY 04/27/2006   Qualifier: Diagnosis of  By: McDiarmid MD, Sherren Mocha    . EDEMA-LEGS,DUE TO VENOUS OBSTRUCT. 04/27/2006   Qualifier: Diagnosis of  By: McDiarmid MD, Sherren Mocha    . 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 Normal uric acid serum level at time of attack.    Marland Kitchen HERNIA, HIATAL, NONCONGENITAL 04/27/2006   Qualifier: Diagnosis of  By: McDiarmid MD, Sherren Mocha    . High risk medications (not anticoagulants) long-term use 03/05/2012  . History of Hemorrhoids 04/27/2006   Qualifier: Diagnosis of  By: McDiarmid MD, Sherren Mocha    . Hx of colonoscopy with polypectomy 04/27/2010   Dr Cristina Gong found three  tubular adenomas each less than 10 mm size  . Hypertension   . Iron deficiency anemia 08/05/2010   Dr Cristina Gong (GI) has evaluated with EGD, colonoscopy, and video capsular endoscopy in 2011 &  2012.  All have been unrevealing as to an origin of IDA.  OV with Dr Cristina Gong (10/28/10) assessment of blood in stool per hemoccult and GER. Hbg 12.1 g/dL, MCV 91.8, Ferritin 30 ng/mL. Patient taking on ferrous sulfate tab daily.   EGD on 03/06/12 by Dr Cristina Gong for IDA non-obstructing Schatzki's ring at Gastroesophageal junction, otherwise normal esophagus and stomach.     . Leg cramps 05/29/2012  . Lumbar herniated disc    History of HNP L4/5 in 2003  .  Macular degeneration, bilateral 10/04/2010   Right eye is wet MD, the other is dry macular degeneration (ARMD). Pt undergoing some form of vascular endothelial growth factor inhibition intraocular therapy.    . Mild cognitive impairment 10/26/2012   (10/25/12) Failed MiniCog screen  . MUSCLE CRAMPS 03/11/2010   Qualifier: Diagnosis of  By: McDiarmid MD, Sherren Mocha    . Muscle spasm of back 09/24/2013  . Myocardial infarct, old   . Numbness and tingling in hands 07/21/2011  . Pacemaker    MDT  . PREDIABETES 09/11/2007   Qualifier: Diagnosis of  By: McDiarmid MD, Sherren Mocha    . Retinal hemorrhage of left eye 06/2010  . RHINITIS, ALLERGIC 04/27/2006   Qualifier: Diagnosis of  By: McDiarmid MD, Sherren Mocha    . SCHATZKI'S RING, HX OF 11/26/2007   Qualifier: Diagnosis of  By: McDiarmid MD, Sherren Mocha  An EGD was performed by Dr Cristina Gong on 04/27/2010 for iron deficiency anemia. There was a a transient hiatal hernia with Schatzki's ring. Stomach and duodenum were normal. EGD on 03/06/12 by Dr Cristina Gong for IDA non-obstructing Schatzki's ring at Gastroesophageal junction, otherwise normal esophagus and stomach.    . Seborrheic keratosis, right anterior thigh 12/12/2013  . Shoulder pain, left 01/09/2014  . Sick sinus syndrome with tachycardia ([place])    MDT  . Soft tissue injury of foot 05/03/2011  . Solar lentigo 06/15/2012  . Solitary kidney, acquired 05/17/2010   Surgical removal for transitional cell cancer by Tresa Endo, MD ([place]). Surveillance cystoscopy by Dr Alinda Money [facility]) on 10/19/12 without evidence of cystoscopic recurrence. Recommend RTC one year for cystoscopy.   Marland Kitchen Spinal stenosis, lumbar   . Transitional cell carcinoma of ureter, history   . Urge incontinence 12/13/2011   Diagnosed in 10/2011 by Dr Bjorn Loser (Urology)   . VENTRICULAR HYPERTROPHY, LEFT 08/28/2008   Qualifier: Diagnosis of  By: McDiarmid MD, Sherren Mocha    . VITAMIN B12 DEFICIENCY 10/07/2009   Qualifier: Diagnosis of  By: McDiarmid MD, Sherren Mocha  Dx  based on a post-TKR anemia work-up Low normal serum B12 with high Methylmalonic acid and homocysteine level  Vit B12 serum level (10/28/10) > 1500 pg/mL   . Vitamin D deficiency 11/02/2010   Serum vitamin D 25(OH) = 10.9 ng/mL (30 -100) on 10/28/10 c/w Vitamin D deficiency.      Past Surgical History:  Procedure Laterality Date  . BREAST SURGERY     breast reduction  . CHOLECYSTECTOMY    . CORONARY ANGIOPLASTY WITH STENT PLACEMENT  2010   BMS RCA, OM2 occluded  . CYSTOSCOPY  09/30/2015   No cystoscopic evidence of uroepithelial neoplasm.  Follow up surveillance cystoscopy in one year  . DUPUYTREN CONTRACTURE RELEASE Right 05/22/2014   Procedure: DUPUYTREN RELEASE AND REPAIR AS NECESSARY RIGHT RING FINGER AND MIDDLE FINGER;  Surgeon: Roseanne Kaufman, MD;  Location: [facility];  Service: Orthopedics;  Laterality: Right;  . ESOPHAGOGASTRODUODENOSCOPY  04/27/2010   Dr Cristina Gong - found transient H/H & Schatzki's ring  .  ESOPHAGOGASTRODUODENOSCOPY ENDOSCOPY  03/06/2012   Dr Cristina Gong - found non-obstuctive Schatzki's ring at [employer] o/w normal EGD.   Marland Kitchen EYE SURGERY     bilateral cataracts  . KNEE ARTHROSCOPY W/ SYNOVECTOMY  11/2009, left knee   Dr Wynelle Link  . NEPHRECTOMY  For transition cell cancer    Dr Tresa Endo, surgeon  . PACEMAKER GENERATOR CHANGE N/A 11/12/2012   Procedure: PACEMAKER GENERATOR CHANGE;  Surgeon: Evans Lance, MD; [employer] [facility] dual-chamber pacemaker serial number [id number]; Laterality: Right  . PACEMAKER INSERTION  2005   Dr Doreatha Lew  . PACEMAKER LEAD REMOVAL  2005   Removal and reinsertion of atrial and ventricular leads due to migration  . REPLACEMENT TOTAL KNEE  2009, Right knee   Dr Wynelle Link  . REPLACEMENT TOTAL KNEE  05/2009, Left knee   Dr Wynelle Link  . TONSILLECTOMY    . TRIGGER FINGER RELEASE Right 05/22/2014   Procedure: RIGHT HAND A-1 PULLEY RELEASE ;  Surgeon: Roseanne Kaufman, MD;  Location: [facility];  Service: Orthopedics;  Laterality: Right;   Family History  Problem  Relation Age of Onset  . Dementia Mother   . Heart disease Father    Social History  Substance Use Topics  . Smoking status: Former Smoker    Packs/day: 1.00    Types: Cigarettes    Quit date: 03/02/2009  . Smokeless tobacco: Never Used  . Alcohol use 12.0 oz/week    14 Glasses of wine, 6 Standard drinks or equivalent per week     Comment: 4-5 glasses of wine per week   OB History    No data available     Review of Systems  Constitutional: Positive for activity change and fatigue. Negative for diaphoresis and fever.  HENT: Negative for congestion and sore throat.   Eyes: Negative.   Respiratory: Positive for cough, shortness of breath and wheezing. Negative for chest tightness.   Cardiovascular: Negative for chest pain.  Gastrointestinal: Negative.   Genitourinary: Negative.   Skin: Negative.   Neurological: Negative for dizziness and speech difficulty.  All other systems reviewed and are negative.   Allergies  Baclofen; Codeine phosphate; Norco [hydrocodone-acetaminophen]; Hydrochlorothiazide; and Montelukast sodium  Home Medications   Prior to Admission medications   Medication Sig Start Date End Date Taking? Authorizing Provider  amiodarone (PACERONE) 200 MG tablet Take 1 tablet (200 mg total) by mouth daily. 05/18/15   Evans Lance, MD  apixaban (ELIQUIS) 2.5 MG TABS tablet Take 1 tablet (2.5 mg total) by mouth 2 (two) times daily. 05/18/15   Blane Ohara McDiarmid, MD  atorvastatin (LIPITOR) 20 MG tablet Take 1 tablet (20 mg total) by mouth daily. 04/17/15   Evans Lance, MD  cholecalciferol (VITAMIN D) 400 units TABS tablet Take 400 Units by mouth daily.    Historical Provider, MD  diclofenac sodium (VOLTAREN) 1 % GEL Apply 2 g topically 4 (four) times daily. 05/21/15   Nicolette Bang, DO  docusate sodium (COLACE) 100 MG capsule Take 100 mg by mouth daily as needed for mild constipation. Reported on 07/08/2015    Historical Provider, MD  Fluticasone-Salmeterol  (ADVAIR DISKUS) 250-50 MCG/DOSE AEPB Inhale 2 puffs into the lungs 2 (two) times daily. 03/05/15   Mercy Riding, MD  furosemide (LASIX) 40 MG tablet Take 0.5 tablets (20 mg total) by mouth daily. 07/08/15   Rhonda G Barrett, PA-C  Melatonin 3 MG TABS Take 3 mg by mouth at bedtime as needed (sleep).    Historical Provider, MD  metoprolol succinate (TOPROL-XL) 25 MG 24 hr tablet Take 1 tablet (25 mg total) by mouth daily. 09/14/15   Belva Crome, MD  nitroGLYCERIN (NITROSTAT) 0.4 MG SL tablet Place 0.4 mg under the tongue every 5 (five) minutes as needed for chest pain (x 3 tabs).    Historical Provider, MD  omeprazole (PRILOSEC) 20 MG capsule Take 1 capsule (20 mg total) by mouth daily. 11/07/14   Blane Ohara McDiarmid, MD  polyethylene glycol (MIRALAX / Floria Raveling) packet MIX 1 PACKET AND TK PO BID 06/15/15   Historical Provider, MD  potassium chloride (K-DUR,KLOR-CON) 10 MEQ tablet Take 1 tablet (10 mEq total) by mouth daily. 06/30/15   Eileen Stanford, PA-C  Sennosides 25 MG TABS Take 2 tablets (50 mg total) by mouth daily as needed. 10/16/15   Blane Ohara McDiarmid, MD  tiotropium (SPIRIVA) 18 MCG inhalation capsule Place 1 capsule (18 mcg total) into inhaler and inhale daily. 01/15/15   Blane Ohara McDiarmid, MD  traMADol (ULTRAM) 50 MG tablet Take 1 tablet (50 mg total) by mouth every 6 (six) hours as needed for moderate pain. 11/17/14   Sela Hua, MD  VENTOLIN HFA 108 [phone] Base) MCG/ACT inhaler INHALE 2 PUFFS WITH SPACER EVERY 4 HOURS AS NEEDED FOR WHEEZING 09/14/15   Blane Ohara McDiarmid, MD   Meds Ordered and Administered this Visit   Medications  methylPREDNISolone sodium succinate (SOLU-MEDROL) 125 mg/2 mL injection 125 mg (125 mg Intramuscular Given 11/08/15 1804)  ipratropium-albuterol (DUONEB) 0.5-2.5 (3) MG/3ML nebulizer solution 3 mL (3 mLs Nebulization Given 11/08/15 1804)  albuterol (PROVENTIL) (2.5 MG/3ML) 0.083% nebulizer solution 2.5 mg (2.5 mg Nebulization Given 11/08/15 1836)    BP 139/72 (BP  Location: Right Arm)   Pulse 86   Temp 97.7 F (36.5 C) (Oral)   Resp 16   SpO2 95%  No data found.   Physical Exam  Constitutional: She is oriented to person, place, and time. She appears well-developed. She appears distressed.  HENT:  Head: Normocephalic and atraumatic.  Mouth/Throat: Oropharynx is clear and moist.  Eyes: EOM are normal.  Neck: Neck supple.  Cardiovascular: Normal heart sounds.   Pulmonary/Chest: She has wheezes.  Poor air movement bilaterally. Inspiratory and expiratory wheezes. Increase in respiratory effort.  Neurological: She is alert and oriented to person, place, and time.  Skin: Skin is warm and dry.  Psychiatric: She has a normal mood and affect.  Nursing note and vitals reviewed.   Urgent Care Course   Clinical Course    Procedures (including critical care time)  Labs Review Labs Reviewed - No data to display  Imaging Review Dg Chest 2 View  Result Date: 11/08/2015 CLINICAL DATA:  Cough. EXAM: CHEST  2 VIEW COMPARISON:  Multiple chest x-rays since 2014. FINDINGS: The focal opacity in the left mid lung, just lateral to the hilum, has been present since 2014 consistent with a chronic etiology, likely scarring. There is atelectasis or scar in the left retrocardiac region/base, unchanged. No pneumothorax. Stable pacemaker. Stable cardiomediastinal silhouette. No other interval changes or acute abnormalities. IMPRESSION: No acute change. Electronically Signed   By: Dorise Bullion III M.D   On: 11/08/2015 17:49     Visual Acuity Review  Right Eye Distance:   Left Eye Distance:   Bilateral Distance:    Right Eye Near:   Left Eye Near:    Bilateral Near:         MDM   1. COPD exacerbation ([place])   2. DOE (dyspnea  on exertion)   3. Chronic diastolic heart failure (H[facility])   4. Other fatigue   5. Bronchospasm    Auscultation after the first DuoNeb rebuilds at most modest improvement in air movement and breath sounds. She is still quite  tight and her lungs with poor air movement and inspiratory and expiratory wheezes. Repeat albuterol neb at 1835 hrs. After the second nebulizer treatment the patient was walked down the hall for test of DOE and stamina. The patient stated that after approximately 15-20 feet she began to fatigue and then walked back to her room. She needed assistance when we arrived to her room and she is having substantial shortness of breath. Since she has had little to no improvement since she arrived she will be transferred to the emergency department for continued management of her dyspnea and COPD exacerbation. Patient states that she just cannot go home feeling like this that she needs to get better.    Janne Napoleon, NP 11/08/15 [phone]

## 2015-11-08 NOTE — ED Triage Notes (Signed)
Pt sent from [facility].  C/o sob and productive cough with white sputum x 1 week that has been worse since yesterday.  States phlegm at [facility] when she coughed was now yellow.  Reports increased sob with exertion/ going on steps.

## 2015-11-09 ENCOUNTER — Other Ambulatory Visit: Payer: Self-pay | Admitting: *Deleted

## 2015-11-09 DIAGNOSIS — K219 Gastro-esophageal reflux disease without esophagitis: Secondary | ICD-10-CM | POA: Diagnosis present

## 2015-11-09 DIAGNOSIS — Z7901 Long term (current) use of anticoagulants: Secondary | ICD-10-CM | POA: Diagnosis not present

## 2015-11-09 DIAGNOSIS — Z905 Acquired absence of kidney: Secondary | ICD-10-CM | POA: Diagnosis not present

## 2015-11-09 DIAGNOSIS — G3184 Mild cognitive impairment, so stated: Secondary | ICD-10-CM | POA: Diagnosis present

## 2015-11-09 DIAGNOSIS — Z96653 Presence of artificial knee joint, bilateral: Secondary | ICD-10-CM | POA: Diagnosis present

## 2015-11-09 DIAGNOSIS — N179 Acute kidney failure, unspecified: Secondary | ICD-10-CM | POA: Diagnosis not present

## 2015-11-09 DIAGNOSIS — N184 Chronic kidney disease, stage 4 (severe): Secondary | ICD-10-CM | POA: Diagnosis present

## 2015-11-09 DIAGNOSIS — R51 Headache: Secondary | ICD-10-CM | POA: Diagnosis present

## 2015-11-09 DIAGNOSIS — Z955 Presence of coronary angioplasty implant and graft: Secondary | ICD-10-CM | POA: Diagnosis not present

## 2015-11-09 DIAGNOSIS — I5032 Chronic diastolic (congestive) heart failure: Secondary | ICD-10-CM | POA: Diagnosis present

## 2015-11-09 DIAGNOSIS — I252 Old myocardial infarction: Secondary | ICD-10-CM | POA: Diagnosis not present

## 2015-11-09 DIAGNOSIS — I130 Hypertensive heart and chronic kidney disease with heart failure and stage 1 through stage 4 chronic kidney disease, or unspecified chronic kidney disease: Secondary | ICD-10-CM | POA: Diagnosis present

## 2015-11-09 DIAGNOSIS — E559 Vitamin D deficiency, unspecified: Secondary | ICD-10-CM | POA: Diagnosis present

## 2015-11-09 DIAGNOSIS — Z8249 Family history of ischemic heart disease and other diseases of the circulatory system: Secondary | ICD-10-CM | POA: Diagnosis not present

## 2015-11-09 DIAGNOSIS — J9621 Acute and chronic respiratory failure with hypoxia: Secondary | ICD-10-CM | POA: Diagnosis not present

## 2015-11-09 DIAGNOSIS — J441 Chronic obstructive pulmonary disease with (acute) exacerbation: Secondary | ICD-10-CM | POA: Diagnosis present

## 2015-11-09 DIAGNOSIS — Z87891 Personal history of nicotine dependence: Secondary | ICD-10-CM | POA: Diagnosis not present

## 2015-11-09 DIAGNOSIS — I2510 Atherosclerotic heart disease of native coronary artery without angina pectoris: Secondary | ICD-10-CM | POA: Diagnosis present

## 2015-11-09 DIAGNOSIS — H9190 Unspecified hearing loss, unspecified ear: Secondary | ICD-10-CM | POA: Diagnosis present

## 2015-11-09 DIAGNOSIS — Z950 Presence of cardiac pacemaker: Secondary | ICD-10-CM | POA: Diagnosis not present

## 2015-11-09 DIAGNOSIS — H3530 Unspecified macular degeneration: Secondary | ICD-10-CM | POA: Diagnosis present

## 2015-11-09 DIAGNOSIS — I480 Paroxysmal atrial fibrillation: Secondary | ICD-10-CM | POA: Diagnosis present

## 2015-11-09 DIAGNOSIS — Z79899 Other long term (current) drug therapy: Secondary | ICD-10-CM | POA: Diagnosis not present

## 2015-11-09 DIAGNOSIS — F419 Anxiety disorder, unspecified: Secondary | ICD-10-CM | POA: Diagnosis present

## 2015-11-09 LAB — HIV ANTIBODY (ROUTINE TESTING W REFLEX): HIV SCREEN 4TH GENERATION: NONREACTIVE

## 2015-11-09 LAB — BRAIN NATRIURETIC PEPTIDE: B Natriuretic Peptide: 244.1 pg/mL — ABNORMAL HIGH (ref 0.0–100.0)

## 2015-11-09 MED ORDER — ACETAMINOPHEN 325 MG PO TABS
650.0000 mg | ORAL_TABLET | Freq: Four times a day (QID) | ORAL | Status: DC | PRN
  Administered 2015-11-09 – 2015-11-13 (×3): 650 mg via ORAL
  Filled 2015-11-09 (×3): qty 2

## 2015-11-09 MED ORDER — METHYLPREDNISOLONE SODIUM SUCC 40 MG IJ SOLR
40.0000 mg | Freq: Two times a day (BID) | INTRAMUSCULAR | Status: DC
  Administered 2015-11-09: 40 mg via INTRAVENOUS
  Filled 2015-11-09: qty 1

## 2015-11-09 MED ORDER — IBUPROFEN 200 MG PO TABS
400.0000 mg | ORAL_TABLET | Freq: Four times a day (QID) | ORAL | Status: DC | PRN
  Administered 2015-11-09 (×3): 400 mg via ORAL
  Filled 2015-11-09 (×3): qty 2

## 2015-11-09 MED ORDER — IPRATROPIUM-ALBUTEROL 0.5-2.5 (3) MG/3ML IN SOLN
3.0000 mL | Freq: Three times a day (TID) | RESPIRATORY_TRACT | Status: DC
  Administered 2015-11-09 – 2015-11-11 (×6): 3 mL via RESPIRATORY_TRACT
  Filled 2015-11-09 (×6): qty 3

## 2015-11-09 NOTE — Progress Notes (Signed)
SATURATION QUALIFICATIONS: (This note is used to comply with regulatory documentation for home oxygen)  Patient Saturations on Room Air at Rest = 92%  Patient Saturations on Room Air while Ambulating = 88%   Patient does not need  home oxygen.

## 2015-11-09 NOTE — Progress Notes (Signed)
Nutrition Consult/Brief Note  RD consulted via COPD Gold Protocol.   Wt Readings from Last 15 Encounters:  11/09/15 157 lb 13.6 oz (71.6 kg)  11/03/15 163 lb (73.9 kg)  10/15/15 167 lb (75.8 kg)  09/14/15 163 lb 1.9 oz (74 kg)  09/03/15 164 lb (74.4 kg)  07/30/15 163 lb (73.9 kg)  07/14/15 161 lb (73 kg)  07/08/15 159 lb (72.1 kg)  07/08/15 159 lb (72.1 kg)  06/26/15 158 lb 12.8 oz (72 kg)  06/18/15 161 lb 4.8 oz (73.2 kg)  06/17/15 167 lb (75.8 kg)  06/14/15 165 lb 14.4 oz (75.3 kg)  05/29/15 164 lb 9.6 oz (74.7 kg)  05/28/15 160 lb (72.6 kg)   Body mass index is 26.27 kg/m. Patient meets criteria for Overweight based on current BMI.   Current diet order is Heart Healthy.  Labs and medications reviewed.   No nutrition interventions warranted at this time. If nutrition issues arise, please consult RD.   Arthur Holms, RD, LDN Pager #: [phone] After-Hours Pager #: [phone]

## 2015-11-09 NOTE — Evaluation (Signed)
Occupational Therapy Evaluation Patient Details Name: [name] MRN: [id number] DOB: 11/03/1923 Today's Date: 11/09/2015    History of Present Illness 82 yo admitted with SOB and acute on chronic respiratory distress. PMHx: CoPD, CAD, pacemaker, HTN, CKD, AFib   Clinical Impression   PTA, pt was independent with all ADLs and used rollator for community mobility. Pt currently requires supervision for all ADLs and basic transfers due to dropping O2 sats and safety. See Pain/Vitals section of this note for detailed SpO2 sats during this session. Feel pt could benefit from an incentive spirometer- RN notified and agreed. Pt will benefit from continued acute OT to increase independence and safety with ADLs and mobility to allow for safe discharge home. No OT follow up or DME recommended at this time.    Follow Up Recommendations  No OT follow up;Supervision/Assistance - 24 hour    Equipment Recommendations  None recommended by OT    Recommendations for Other Services       Precautions / Restrictions Precautions Precautions: Fall Precaution Comments: watch O2 sats Restrictions Weight Bearing Restrictions: No      Mobility Bed Mobility Overal bed mobility: Modified Independent             General bed mobility comments: HOB flat, no use of bedrails.  Transfers Overall transfer level: Needs assistance Equipment used: None Transfers: Sit to/from Stand Sit to Stand: Supervision         General transfer comment: Supervision for safety and to monitor O2 sats.     Balance Overall balance assessment: Needs assistance Sitting-balance support: No upper extremity supported;Feet supported Sitting balance-Leahy Scale: Normal     Standing balance support: No upper extremity supported;During functional activity Standing balance-Leahy Scale: Good                              ADL Overall ADL's : Needs assistance/impaired     Grooming: Wash/dry  hands;Supervision/safety;Standing   Upper Body Bathing: Supervision/ safety;Sitting   Lower Body Bathing: Supervison/ safety;Sit to/from stand   Upper Body Dressing : Supervision/safety;Sitting   Lower Body Dressing: Supervision/safety;Sit to/from stand   Toilet Transfer: Supervision/safety;Ambulation;Comfort height toilet   Toileting- Clothing Manipulation and Hygiene: Supervision/safety;Sit to/from stand       Functional mobility during ADLs: Supervision/safety       Vision Vision Assessment?: No apparent visual deficits   Perception     Praxis      Pertinent Vitals/Pain Pain Assessment: 0-10 Pain Score: 6  Pain Location: head Pain Descriptors / Indicators: Headache Pain Intervention(s): Monitored during session;Repositioned;Patient requesting pain meds-RN notified;RN gave pain meds during session  SpO2 on 2L sitting in recliner: 90% SpO2 on 2L sitting recliner with encouraged pursed-lip breathing: 95%  SpO2 on RA sitting in reclincer: 88% SpO2 on RA ambulating to room door: 88-91%  SpO2 on RA laying in bed: 88-91%     Hand Dominance Right   Extremity/Trunk Assessment Upper Extremity Assessment Upper Extremity Assessment: Overall WFL for tasks assessed   Lower Extremity Assessment Lower Extremity Assessment: Overall WFL for tasks assessed   Cervical / Trunk Assessment Cervical / Trunk Assessment: Kyphotic   Communication Communication Communication: No difficulties   Cognition Arousal/Alertness: Awake/alert Behavior During Therapy: WFL for tasks assessed/performed Overall Cognitive Status: Within Functional Limits for tasks assessed                     General Comments  Exercises       Shoulder Instructions      Home Living Family/patient expects to be discharged to:: Private residence Living Arrangements: Children Available Help at Discharge: Family;Available 24 hours/day Type of Home: House Home Access: Stairs to  enter [employer] of Steps: 3 Entrance Stairs-Rails: Right;Left Home Layout: Two level;Full bath on main level;Bed/bath upstairs Alternate Level Stairs-Number of Steps: flight Alternate Level Stairs-Rails: Right Bathroom Shower/Tub: Tub/shower unit;Curtain Shower/tub characteristics: Curtain Bathroom Toilet: Handicapped height     Home Equipment: Environmental consultant - 4 wheels;Shower seat;Bedside commode   Additional Comments: 4 daughters that can assist. One lives with her but is a Catering manager. Pt can stay at other daughter's house who has an accessible home with one step up to bed      Prior Functioning/Environment Level of Independence: Independent with assistive device(s)        Comments: Patient reports she uses her RW for longer distances.  Patient drives.Patient cooks. has a housekeeper, goes on a cruise with her girlfriends twice a year    OT Diagnosis: Acute pain   OT Problem List: Decreased safety awareness;Decreased knowledge of use of DME or AE;Pain   OT Treatment/Interventions: Self-care/ADL training;Therapeutic exercise;DME and/or AE instruction;Therapeutic activities;Patient/family education;Balance training;Energy conservation    OT Goals(Current goals can be found in the care plan section) Acute Rehab OT Goals Patient Stated Goal: return home and care for myself OT Goal Formulation: With patient Time For Goal Achievement: 11/23/15 Potential to Achieve Goals: Good ADL Goals Pt Will Perform Tub/Shower Transfer: Tub transfer;with modified independence;ambulating;shower seat Additional ADL Goal #1: Pt will verbalize 3 low vision strategies to implement at home to increase her independence with ADL/IADL tasks. Additional ADL Goal #2: Pt will verbalize 3 energy conservation strategies to implement at home to increase independence with ADL/IADL tasks.  OT Frequency: Min 2X/week   Barriers to D/C:            Co-evaluation              End of Session  Nurse Communication: Mobility status  Activity Tolerance: Patient tolerated treatment well Patient left: in bed;with call bell/phone within reach;with nursing/sitter in room   Time: 1050-1120 OT Time Calculation (min): 30 min Charges:  OT General Charges $OT Visit: 1 Procedure OT Evaluation $OT Eval Moderate Complexity: 1 Procedure OT Treatments $Self Care/Home Management : 8-22 mins G-Codes: OT G-codes **NOT FOR INPATIENT CLASS** Functional Assessment Tool Used: clinical judgement Functional Limitation: Self care Self Care Current Status (Y3888): At least 1 percent but less than 20 percent impaired, limited or restricted Self Care Goal Status (L5797): 0 percent impaired, limited or restricted  Redmond Baseman, OTR/L Pager: [phone] 11/09/2015, 11:31 AM

## 2015-11-09 NOTE — Care Management Obs Status (Signed)
[place] NOTIFICATION   Patient Details  Name: [name] MRN: [id number] Date of Birth: 1923-09-06   Medicare Observation Status Notification Given:  Yes    Dawayne Patricia, RN 11/09/2015, 2:38 PM

## 2015-11-09 NOTE — Evaluation (Addendum)
Physical Therapy Evaluation Patient Details Name: [name] MRN: [id number] DOB: 03-12-1923 Today's Date: 11/09/2015   History of Present Illness  82 yo admitted with SOB and acute on chronic respiratory distress. PMHx: CoPD, CAD, pacemaker, HTN, CKD, AFib  Clinical Impression  Pt very pleasant and eager to move around. Pt very limited by fatigue and SOB with activity today. Sats dropping to 86% with limited gait and able to recover to 90% standing with pursed lip breathing. Pt on RA at rest only able to achieve 92% with quick drop in saturation with limited activity and unable to walk household distance or perform stairs today. Pt with decreased activity tolerance, mobility and function who will benefit from acute therapy to maximize independence prior to D/C.     Follow Up Recommendations Home health PT    Equipment Recommendations  None recommended by PT    Recommendations for Other Services       Precautions / Restrictions Precautions Precautions: Fall Precaution Comments: watch sats      Mobility  Bed Mobility Overal bed mobility: Modified Independent                Transfers Overall transfer level: Modified independent                  Ambulation/Gait Ambulation/Gait assistance: Supervision Ambulation Distance (Feet): 75 Feet Assistive device: Rolling walker (2 wheeled) Gait Pattern/deviations: Step-through pattern;Decreased stride length   Gait velocity interpretation: Below normal speed for age/gender General Gait Details: cues for posture, breathing technique and energy conservation as pt sats dropping to 86% on RA with limited activity and able to recover to 90% with standing rest and pursed lip breathing  Stairs            Wheelchair Mobility    Modified Rankin (Stroke Patients Only)       Balance Overall balance assessment: Needs assistance   Sitting balance-Leahy Scale: Normal       Standing balance-Leahy Scale: Good                                Pertinent Vitals/Pain Pain Assessment: 0-10 Pain Score: 4  Pain Location: head Pain Descriptors / Indicators: Aching Pain Intervention(s): Limited activity within patient's tolerance;Patient requesting pain meds-RN notified    Home Living Family/patient expects to be discharged to:: Private residence Living Arrangements: Children Available Help at Discharge: Family;Available 24 hours/day Type of Home: House Home Access: Stairs to enter Entrance Stairs-Rails: Psychiatric nurse of Steps: 3 Home Layout: Two level;Full bath on main level;Bed/bath upstairs Home Equipment: Walker - 4 wheels;Shower seat;Bedside commode Additional Comments: 4 daughters that can assist. One lives with her but is a Catering manager. Pt can stay at other daughter's house who has an accessible home with one step up to bed    Prior Function           Comments: Patient reports she uses her RW for longer distances.  Patient drives.Patient cooks. has a Administrator, [employer]   Dominant Hand: Right    Extremity/Trunk Assessment   Upper Extremity Assessment: Overall WFL for tasks assessed           Lower Extremity Assessment: Overall WFL for tasks assessed      Cervical / Trunk Assessment: Kyphotic  Communication   Communication: No difficulties  Cognition Arousal/Alertness: Awake/alert Behavior During Therapy: WFL for tasks assessed/performed Overall Cognitive Status: Within Functional  Limits for tasks assessed                      General Comments      Exercises        Assessment/Plan    PT Assessment Patient needs continued PT services  PT Diagnosis Difficulty walking   PT Problem List Decreased activity tolerance;Decreased knowledge of use of DME;Cardiopulmonary status limiting activity  PT Treatment Interventions DME instruction;Gait training;Functional mobility training;Patient/family education;Therapeutic  activities   PT Goals (Current goals can be found in the Care Plan section) Acute Rehab PT Goals Patient Stated Goal: return home and care for myself PT Goal Formulation: With patient Time For Goal Achievement: 11/23/15 Potential to Achieve Goals: Good    Frequency Min 3X/week   Barriers to discharge        Co-evaluation               End of Session   Activity Tolerance: Patient limited by fatigue Patient left: in chair;with call bell/phone within reach Nurse Communication: Mobility status    Functional Assessment Tool Used: clinical judgement Functional Limitation: Mobility: Walking and moving around Mobility: Walking and Moving Around Current Status (V0131): At least 20 percent but less than 40 percent impaired, limited or restricted Mobility: Walking and Moving Around Goal Status [phone]): At least 1 percent but less than 20 percent impaired, limited or restricted    Time: 0745-0811 PT Time Calculation (min) (ACUTE ONLY): 26 min   Charges:   PT Evaluation $PT Eval Moderate Complexity: 1 Procedure     PT G Codes:   PT G-Codes **NOT FOR INPATIENT CLASS** Functional Assessment Tool Used: clinical judgement Functional Limitation: Mobility: Walking and moving around Mobility: Walking and Moving Around Current Status (L5797): At least 20 percent but less than 40 percent impaired, limited or restricted Mobility: Walking and Moving Around Goal Status [phone]): At least 1 percent but less than 20 percent impaired, limited or restricted    Melford Aase 11/09/2015, 10:54 AM Elwyn Reach, [place]

## 2015-11-09 NOTE — Progress Notes (Signed)
PROGRESS NOTE    [name]  WUJ:[id number] DOB: 21-Nov-1923 DOA: 11/08/2015 PCP: Acquanetta Sit, MD Brief Narrative: [name] is a 82 y.o. female with medical history significant of COPD, not on chronic oxygen, CKD-IV, PAF on Eliquis, diastolic heart failure, sick sinus syndrome status post pacemaker placement, hypertension, GERD, CAD, who presents with productive cough and shortness of breath. Patient says that she has been having cough and shortness of breath for almost 2 weeks, which has been progressively getting worse. No fever, chills or chest pain.  She was started on azithromycin by her pulmonologist and had follow-up 5 days ago with some improvement in her symptoms. States that the next day she began to develop a productive cough, with yellow culture sputum production. Her SOB has also worsened, particularly on exertion. cXR clear, EKG unchanged, troponin normal  Assessment & Plan:  Acute on chronic respiratory failure with hypoxia due to COPD exacerbation -CXr clear -improving, cut down solumedrol, nebs, doxycycline -supportive care -check ambulatory O2 sats  Chronic kidney disease (CKD), stage 3 -: stable. Baseline creatinine 1.5-1.7.   Atrial Fibrillation: CHA2DS2-VASc Score is 6, -rate controlled, continue Eliquis, amiodarone, metoprolol  Chronic diastolic CHF (congestive heart failure) ([place]): 2-D Echo on 02/26/15 showed EF 55-60% with grade 2 diastolic dysfunction. - Patient has trace leg edema, no JVD, CHF compensated. -Continue low-dose Lasix 20 mg daily and metoprolol  CAD S/P RCA BMS, residual CFX disease: No CP -continue metoprolol and when necessary nitroglycerin  GERD: -Protonix  Headache -acute on chronic, likely worse with steroids -tylenol, tramadol  DVT ppx: On Eliquis Code Status: Full code Family Communication: None at bed side.   Disposition Plan:  home tomorrow     Subjective: C/o headache, breathing better  Objective: Vitals:   11/09/15 0848 11/09/15 1058 11/09/15 1336 11/09/15 1358  BP:  130/60  108/77  Pulse:  80  60  Resp:    18  Temp:    98.6 F (37 C)  TempSrc:    Oral  SpO2: 95%  93% 98%  Weight:      Height:        Intake/Output Summary (Last 24 hours) at 11/09/15 1554 Last data filed at 11/09/15 1230  Gross per 24 hour  Intake              840 ml  Output                0 ml  Net              840 ml   Filed Weights   11/08/15 2053 11/09/15 0016  Weight: 72.1 kg (159 lb) 71.6 kg (157 lb 13.6 oz)    Examination:  General exam: Appears calm and comfortable, AAOx3  Respiratory system: CTAB, no wheezing Cardiovascular system: S1 & S2 heard, RRR. No JVD, murmurs, rubs, gallops or clicks. No pedal edema. Gastrointestinal system: Abdomen is nondistended, soft and nontender. No organomegaly or masses felt. Normal bowel sounds heard. Central nervous system: Alert and oriented. No focal neurological deficits. Extremities: Symmetric 5 x 5 power. Skin: No rashes, lesions or ulcers Psychiatry: Judgement and insight appear normal. Mood & affect appropriate.     Data Reviewed: I have personally reviewed following labs and imaging studies  CBC:  Recent Labs Lab 11/08/15 1942  WBC 8.8  HGB 11.5*  HCT 36.7  MCV 97.9  PLT 956   Basic Metabolic Panel:  Recent Labs Lab 11/08/15 1942  NA 134*  K 4.1  CL 101  CO2 24  GLUCOSE 122*  BUN 24*  CREATININE 1.73*  CALCIUM 9.1   GFR: Estimated Creatinine Clearance: 21.9 mL/min (by C-G formula based on SCr of 1.73 mg/dL). Liver Function Tests: No results for input(s): AST, ALT, ALKPHOS, BILITOT, PROT, ALBUMIN in the last 168 hours. No results for input(s): LIPASE, AMYLASE in the last 168 hours. No results for input(s): AMMONIA in the last 168 hours. Coagulation Profile: No results for input(s): INR, PROTIME in the last 168 hours. Cardiac Enzymes: No results for input(s): CKTOTAL, CKMB, CKMBINDEX, TROPONINI in the last 168 hours. BNP (last 3  results) No results for input(s): PROBNP in the last 8760 hours. HbA1C: No results for input(s): HGBA1C in the last 72 hours. CBG: No results for input(s): GLUCAP in the last 168 hours. Lipid Profile: No results for input(s): CHOL, HDL, LDLCALC, TRIG, CHOLHDL, LDLDIRECT in the last 72 hours. Thyroid Function Tests: No results for input(s): TSH, T4TOTAL, FREET4, T3FREE, THYROIDAB in the last 72 hours. Anemia Panel: No results for input(s): VITAMINB12, FOLATE, FERRITIN, TIBC, IRON, RETICCTPCT in the last 72 hours. Urine analysis:    Component Value Date/Time   COLORURINE YELLOW 06/13/2015 2238   APPEARANCEUR CLOUDY (A) 06/13/2015 2238   LABSPEC 1.012 06/13/2015 2238   PHURINE 5.5 06/13/2015 2238   GLUCOSEU NEGATIVE 06/13/2015 2238   HGBUR NEGATIVE 06/13/2015 2238   HGBUR negative 03/19/2007 1404   BILIRUBINUR NEGATIVE 06/13/2015 2238   KETONESUR NEGATIVE 06/13/2015 2238   PROTEINUR NEGATIVE 06/13/2015 2238   UROBILINOGEN 0.2 05/01/2014 1345   NITRITE NEGATIVE 06/13/2015 2238   LEUKOCYTESUR NEGATIVE 06/13/2015 2238   Sepsis Labs: @LABRCNTIP (procalcitonin:4,lacticidven:4)  )No results found for this or any previous visit (from the past 240 hour(s)).       Radiology Studies: Dg Chest 2 View  Result Date: 11/08/2015 CLINICAL DATA:  Cough. EXAM: CHEST  2 VIEW COMPARISON:  Multiple chest x-rays since 2014. FINDINGS: The focal opacity in the left mid lung, just lateral to the hilum, has been present since 2014 consistent with a chronic etiology, likely scarring. There is atelectasis or scar in the left retrocardiac region/base, unchanged. No pneumothorax. Stable pacemaker. Stable cardiomediastinal silhouette. No other interval changes or acute abnormalities. IMPRESSION: No acute change. Electronically Signed   By: Dorise Bullion III M.D   On: 11/08/2015 17:49        Scheduled Meds: . amiodarone  200 mg Oral Daily  . apixaban  2.5 mg Oral BID  . atorvastatin  20 mg Oral  Daily  . cholecalciferol  400 Units Oral Daily  . doxycycline  100 mg Oral Q12H  . furosemide  20 mg Oral Daily  . ipratropium-albuterol  3 mL Nebulization TID  . methylPREDNISolone (SOLU-MEDROL) injection  40 mg Intravenous Q12H  . metoprolol succinate  25 mg Oral Daily  . pantoprazole  40 mg Oral Daily   Continuous Infusions:    LOS: 0 days    Time spent: 28min    Domenic Polite, MD [facility] Pager [phone]  If 7PM-7AM, please contact night-coverage [email or web address] Password [facility] 11/09/2015, 3:54 PM

## 2015-11-09 NOTE — Care Management Note (Signed)
Case Management Note Marvetta Gibbons RN, BSN Unit 2W-Case Manager [phone]  Patient Details  Name: [name] MRN: [id number] Date of Birth: [birth year]  Subjective/Objective:   Pt admitted with acute on chronic resp. Failure- COPD                 Action/Plan: PTA pt lived at home- per PT eval - recommendations for HHPT- pt would also benefit from [facility] for COPD management- spoke with pt at bedside- she states she has had [place] in the past with [facility]- she reports that if she needs HH again she would want to use [facility] again for services- referral has also been made for [facility] services to assist in community COPD management. CM to follow for [facility] needs if orders are written for discharge will f/u with pt to arrange with [facility].   Expected Discharge Date:                  Expected Discharge Plan:  [place]  In-House Referral:     Discharge planning Services  CM Consult  Post Acute Care Choice:    Choice offered to:  Patient  DME Arranged:    DME Agency:     HH Arranged:    [place] Agency:  [place]  Status of Service:  In process, will continue to follow  If discussed at Long Length of Stay Meetings, dates discussed:    Additional Comments:  Dawayne Patricia, RN 11/09/2015, 2:48 PM

## 2015-11-09 NOTE — Consult Note (Addendum)
   [facility] CM Inpatient Consult   11/09/2015  [name] 02-13-1924 [id number]    Referral from inpatient RNCM for [place] [facility] for COPD disease and symptom management. She has had x2 hospital admissions in the past 6 months. Went to bedside to speak with [name] and explain [facility] Care Management program. After explaining in detail and questions answered, Ms. [name] gave written consent for [place] Management program services. She states her daughter who is a flight attendant stays with her sometimes. She has a total of 4 daughters who she says are all very supportive. Denies having issues or concerns with obtaining medications or with transportation. Discussed primary [facility] Care Management follow up will be for COPD disease and symptom management. She is agreeable to this. Confirmed Primary Care MD is Dr. McDiarmid. Confirmed best contact number as [phone]. [facility] Care Management packet and contact information provided. Reiterated that [place] Management services will not interfere or replace services provided by home health. Explained that she will receive post hospital transition of care calls and will be assessed for monthly home visits. Made inpatient RNCM aware that [name] will be followed by [place] [facility] post discharge.   Marthenia Rolling, MSN-Ed, RN,BSN [facility] Liaison [phone]

## 2015-11-10 ENCOUNTER — Telehealth: Payer: Self-pay | Admitting: Family Medicine

## 2015-11-10 LAB — BASIC METABOLIC PANEL
Anion gap: 10 (ref 5–15)
Anion gap: 10 (ref 5–15)
Anion gap: 10 (ref 5–15)
BUN: 41 mg/dL — ABNORMAL HIGH (ref 6–20)
BUN: 43 mg/dL — ABNORMAL HIGH (ref 6–20)
BUN: 47 mg/dL — ABNORMAL HIGH (ref 6–20)
CALCIUM: 9.3 mg/dL (ref 8.9–10.3)
CALCIUM: 9.3 mg/dL (ref 8.9–10.3)
CALCIUM: 9.2 mg/dL (ref 8.9–10.3)
CO2: 23 mmol/L (ref 22–32)
CO2: 22 mmol/L (ref 22–32)
CO2: 22 mmol/L (ref 22–32)
CREATININE: 2.44 mg/dL — AB (ref 0.44–1.00)
CREATININE: 2.47 mg/dL — AB (ref 0.44–1.00)
CREATININE: 2.7 mg/dL — AB (ref 0.44–1.00)
Chloride: 97 mmol/L — ABNORMAL LOW (ref 101–111)
Chloride: 98 mmol/L — ABNORMAL LOW (ref 101–111)
Chloride: 98 mmol/L — ABNORMAL LOW (ref 101–111)
GFR calc non Af Amer: 16 mL/min — ABNORMAL LOW (ref >60)
GFR calc non Af Amer: 16 mL/min — ABNORMAL LOW (ref >60)
GFR calc non Af Amer: 15 mL/min — ABNORMAL LOW (ref >60)
GFR, EST AFRICAN AMERICAN: 19 mL/min — AB (ref >60)
GFR, EST AFRICAN AMERICAN: 19 mL/min — AB (ref >60)
GFR, EST AFRICAN AMERICAN: 17 mL/min — AB (ref >60)
GLUCOSE: 146 mg/dL — AB (ref 65–99)
Glucose, Bld: 139 mg/dL — ABNORMAL HIGH (ref 65–99)
Glucose, Bld: 131 mg/dL — ABNORMAL HIGH (ref 65–99)
Potassium: 5.5 mmol/L — ABNORMAL HIGH (ref 3.5–5.1)
Potassium: 5 mmol/L (ref 3.5–5.1)
Potassium: 5.3 mmol/L — ABNORMAL HIGH (ref 3.5–5.1)
SODIUM: 130 mmol/L — AB (ref 135–145)
Sodium: 130 mmol/L — ABNORMAL LOW (ref 135–145)
Sodium: 130 mmol/L — ABNORMAL LOW (ref 135–145)

## 2015-11-10 LAB — CBC
HEMATOCRIT: 30.6 % — AB (ref 36.0–46.0)
Hemoglobin: 9.9 g/dL — ABNORMAL LOW (ref 12.0–15.0)
MCH: 30.6 pg (ref 26.0–34.0)
MCHC: 32.4 g/dL (ref 30.0–36.0)
MCV: 94.4 fL (ref 78.0–100.0)
PLATELETS: 260 10*3/uL (ref 150–400)
RBC: 3.24 MIL/uL — ABNORMAL LOW (ref 3.87–5.11)
RDW: 12.6 % (ref 11.5–15.5)
WBC: 10.1 10*3/uL (ref 4.0–10.5)

## 2015-11-10 MED ORDER — PREDNISONE 20 MG PO TABS
40.0000 mg | ORAL_TABLET | Freq: Every day | ORAL | Status: DC
  Administered 2015-11-10 – 2015-11-12 (×3): 40 mg via ORAL
  Filled 2015-11-10 (×3): qty 2

## 2015-11-10 MED ORDER — SODIUM CHLORIDE 0.9 % IV SOLN
INTRAVENOUS | Status: DC
  Administered 2015-11-10 – 2015-11-11 (×2): via INTRAVENOUS

## 2015-11-10 NOTE — Telephone Encounter (Signed)
Pt has been admitted to the hospital for breathing issues and elevated kidney issues.  She has been there since Sunday.  Just wanted Dr McDiarmid.   Daughter would like for dr mcdiarmid to stop and speak to pt if possible. Pt depends on dr tremendously and is always cheered up by him

## 2015-11-10 NOTE — Progress Notes (Signed)
PROGRESS NOTE    [name]  NLG:[id number] DOB: 1924/01/12 DOA: 11/08/2015 PCP: Acquanetta Sit, MD Brief Narrative: [name] is a 82 y.o. female with medical history significant of COPD, not on chronic oxygen, CKD-IV, PAF on Eliquis, diastolic heart failure, sick sinus syndrome status post pacemaker placement, hypertension, GERD, CAD, who presents with productive cough and shortness of breath. Patient says that she has been having cough and shortness of breath for almost 2 weeks, which has been progressively getting worse. No fever, chills or chest pain.  She was started on azithromycin by her pulmonologist and had follow-up 5 days ago with some improvement in her symptoms. States that the next day she began to develop a productive cough, with yellow culture sputum production. Her SOB has also worsened, particularly on exertion. cXR clear, EKG unchanged, troponin normal. Improving with Rx of COPD flare, mild bump in creatinine today  Assessment & Plan:  Acute on chronic respiratory failure with hypoxia due to COPD exacerbation -CXr clear -improving, no wheezing today, cut down solumedrol-change to Prednisone today -nebs, doxycycline -supportive care -Ambulatory O2 sats 88% and above  Atrial Fibrillation: CHA2DS2-VASc Score is 6, -rate controlled, continue Eliquis, amiodarone, metoprolol -pacemaker for SSS  Chronic diastolic CHF (congestive heart failure) ([place]): 2-D Echo on 02/26/15 showed EF 55-60% with grade 2 diastolic dysfunction. - Patient has trace leg edema, no JVD, CHF compensated. - got Iv lasix and Lasix 20 mg daily yesterday, due to bump in creatinine will hold lasix today - continue metoprolol  Aki on CKD4 -baseline creatinine of 1.7, bumped to 2.4 this am, likely due to overdiuresis and got Ibuprofen yesterday for severe unrelenting headache which didn't resolve with tylenol and tramadol -stopped NSAIDs and Lasix, IVF for 12-24hours, repeat Bmet in am -on eliquis  for P.Afib, will need to consider alternate anticoagulation if creatinine doesn't improve by tomorrow  CAD S/P RCA BMS, residual CFX disease: No CP -continue metoprolol and when necessary nitroglycerin  GERD: -Protonix  Headache -acute on chronic, likely worse with steroids -tylenol, tramadol  DVT ppx: On Eliquis Code Status: Full code Family Communication: None at bed side, called and d/w daughter yesterday   Disposition Plan:  home tomorrow if creatinine improving     Subjective: C/o headache, breathing better  Objective: Vitals:   11/09/15 1358 11/09/15 2036 11/09/15 2046 11/10/15 0439  BP: 108/77  124/89 138/61  Pulse: 60  65 61  Resp: 18  18 18   Temp: 98.6 F (37 C)  97.8 F (36.6 C) 97.8 F (36.6 C)  TempSrc: Oral  Oral Oral  SpO2: 98% 98% 98% 96%  Weight:      Height:        Intake/Output Summary (Last 24 hours) at 11/10/15 0955 Last data filed at 11/09/15 1700  Gross per 24 hour  Intake              960 ml  Output                0 ml  Net              960 ml   Filed Weights   11/08/15 2053 11/09/15 0016  Weight: 72.1 kg (159 lb) 71.6 kg (157 lb 13.6 oz)    Examination:  General exam: Appears calm and comfortable, AAOx3  Respiratory system: good air movement, no wheezing, no crackles Cardiovascular system: S1 & S2 heard, RRR. No JVD, murmurs, rubs, gallops or clicks. No pedal edema. Gastrointestinal system: Abdomen is  nondistended, soft and nontender. Normal bowel sounds heard. Central nervous system: Alert and oriented. No focal neurological deficits. Extremities: Symmetric 5 x 5 power, no edema Skin: No rashes, lesions or ulcers Psychiatry: Judgement and insight appear normal. Mood & affect appropriate.     Data Reviewed: I have personally reviewed following labs and imaging studies  CBC:  Recent Labs Lab 11/08/15 1942 11/10/15 0412  WBC 8.8 10.1  HGB 11.5* 9.9*  HCT 36.7 30.6*  MCV 97.9 94.4  PLT 265 767   Basic Metabolic  Panel:  Recent Labs Lab 11/08/15 1942 11/10/15 0412 11/10/15 0725  NA 134* 130* 130*  K 4.1 5.5* 5.0  CL 101 97* 98*  CO2 24 23 22   GLUCOSE 122* 139* 131*  BUN 24* 41* 43*  CREATININE 1.73* 2.44* 2.47*  CALCIUM 9.1 9.3 9.3   GFR: Estimated Creatinine Clearance: 15.3 mL/min (by C-G formula based on SCr of 2.47 mg/dL). Liver Function Tests: No results for input(s): AST, ALT, ALKPHOS, BILITOT, PROT, ALBUMIN in the last 168 hours. No results for input(s): LIPASE, AMYLASE in the last 168 hours. No results for input(s): AMMONIA in the last 168 hours. Coagulation Profile: No results for input(s): INR, PROTIME in the last 168 hours. Cardiac Enzymes: No results for input(s): CKTOTAL, CKMB, CKMBINDEX, TROPONINI in the last 168 hours. BNP (last 3 results) No results for input(s): PROBNP in the last 8760 hours. HbA1C: No results for input(s): HGBA1C in the last 72 hours. CBG: No results for input(s): GLUCAP in the last 168 hours. Lipid Profile: No results for input(s): CHOL, HDL, LDLCALC, TRIG, CHOLHDL, LDLDIRECT in the last 72 hours. Thyroid Function Tests: No results for input(s): TSH, T4TOTAL, FREET4, T3FREE, THYROIDAB in the last 72 hours. Anemia Panel: No results for input(s): VITAMINB12, FOLATE, FERRITIN, TIBC, IRON, RETICCTPCT in the last 72 hours. Urine analysis:    Component Value Date/Time   COLORURINE YELLOW 06/13/2015 2238   APPEARANCEUR CLOUDY (A) 06/13/2015 2238   LABSPEC 1.012 06/13/2015 2238   PHURINE 5.5 06/13/2015 2238   GLUCOSEU NEGATIVE 06/13/2015 2238   HGBUR NEGATIVE 06/13/2015 2238   HGBUR negative 03/19/2007 1404   BILIRUBINUR NEGATIVE 06/13/2015 2238   KETONESUR NEGATIVE 06/13/2015 2238   PROTEINUR NEGATIVE 06/13/2015 2238   UROBILINOGEN 0.2 05/01/2014 1345   NITRITE NEGATIVE 06/13/2015 2238   LEUKOCYTESUR NEGATIVE 06/13/2015 2238   Sepsis Labs: @LABRCNTIP (procalcitonin:4,lacticidven:4)  )No results found for this or any previous visit (from the  past 240 hour(s)).       Radiology Studies: Dg Chest 2 View  Result Date: 11/08/2015 CLINICAL DATA:  Cough. EXAM: CHEST  2 VIEW COMPARISON:  Multiple chest x-rays since 2014. FINDINGS: The focal opacity in the left mid lung, just lateral to the hilum, has been present since 2014 consistent with a chronic etiology, likely scarring. There is atelectasis or scar in the left retrocardiac region/base, unchanged. No pneumothorax. Stable pacemaker. Stable cardiomediastinal silhouette. No other interval changes or acute abnormalities. IMPRESSION: No acute change. Electronically Signed   By: Dorise Bullion III M.D   On: 11/08/2015 17:49        Scheduled Meds: . amiodarone  200 mg Oral Daily  . apixaban  2.5 mg Oral BID  . atorvastatin  20 mg Oral Daily  . cholecalciferol  400 Units Oral Daily  . doxycycline  100 mg Oral Q12H  . ipratropium-albuterol  3 mL Nebulization TID  . metoprolol succinate  25 mg Oral Daily  . pantoprazole  40 mg Oral Daily  . predniSONE  40 mg Oral Q breakfast   Continuous Infusions: . sodium chloride       LOS: 1 day    Time spent: 76min    Domenic Polite, MD [facility] Pager [phone]  If 7PM-7AM, please contact night-coverage [email or web address] Password [facility] 11/10/2015, 9:55 AM

## 2015-11-10 NOTE — Progress Notes (Signed)
Physical Therapy Treatment Patient Details Name: [name] MRN: [id number] DOB: 1923-03-25 Today's Date: 12-10-15    History of Present Illness 82 yo admitted with SOB and acute on chronic respiratory distress. PMHx: CoPD, CAD, pacemaker, HTN, CKD, AFib    PT Comments    Pt remains pleasant with HA impairing her function per pt report. Pt with ability to maintain sats at 90-93% on RA throughout session today with implementation of pursed lip breathing. Pt states she can stay at daughter's house and is starting to return to baseline function. Will continue to follow.   HR 73-84 sats 90-94% RA  Follow Up Recommendations  Home health PT     Equipment Recommendations       Recommendations for Other Services       Precautions / Restrictions Precautions Precautions: Fall Precaution Comments: watch O2 sats    Mobility  Bed Mobility Overal bed mobility: Modified Independent                Transfers Overall transfer level: Modified independent                  Ambulation/Gait Ambulation/Gait assistance: Supervision Ambulation Distance (Feet): 150 Feet Assistive device: Rolling walker (2 wheeled) Gait Pattern/deviations: Step-through pattern     General Gait Details: cues for posture, breathing technique pt able to maintain sats 90-93% on RA with activity today   Stairs            Wheelchair Mobility    Modified Rankin (Stroke Patients Only)       Balance                                    Cognition Arousal/Alertness: Awake/alert Behavior During Therapy: WFL for tasks assessed/performed Overall Cognitive Status: Within Functional Limits for tasks assessed                      Exercises General Exercises - Lower Extremity Long Arc Quad: AROM;15 reps;Both;Seated Hip Flexion/Marching: AROM;15 reps;Both;Seated Toe Raises: AROM;15 reps;Both;Seated    General Comments        Pertinent Vitals/Pain Pain Score: 4   Pain Location: hyead Pain Descriptors / Indicators: Aching Pain Intervention(s): Limited activity within patient's tolerance;RN gave pain meds during session    Home Living                      Prior Function            PT Goals (current goals can now be found in the care plan section) Progress towards PT goals: Progressing toward goals    Frequency       PT Plan Current plan remains appropriate    Co-evaluation             End of Session   Activity Tolerance: Patient tolerated treatment well Patient left: in chair;with call bell/phone within reach;with nursing/sitter in room     Time: 1005-1029 PT Time Calculation (min) (ACUTE ONLY): 24 min  Charges:  $Gait Training: 8-22 mins $Therapeutic Exercise: 8-22 mins                    G Codes:      Melford Aase 2015/12/10, 11:47 AM Elwyn Reach, [place]

## 2015-11-10 NOTE — Progress Notes (Signed)
Occupational Therapy Treatment/Discharge Patient Details Name: [name] MRN: [id number] DOB: 1924/02/14 Today's Date: 11/10/2015    History of present illness 82 yo admitted with SOB and acute on chronic respiratory distress. PMHx: CoPD, CAD, pacemaker, HTN, CKD, AFib   OT comments  Pt progressing well, but continues to be limited by her headache pain. Pt was able to complete all ADLs and trnasfers including tub transfer with modified independence. Discussed fall prevention and home safety strategies to minimize pt's fall risk at home. Also discussed pt's options as she expressed concern about maintaining her large home with sporadic assistance from her daughter. All education has been completed and pt has no further questions. Pt with no further acute OT needs. OT signing off.  SpO2 remained 91-94% throughout session   Follow Up Recommendations  No OT follow up;Supervision/Assistance - 24 hour    Equipment Recommendations  None recommended by OT    Recommendations for Other Services      Precautions / Restrictions Precautions Precautions: Fall Precaution Comments: watch O2 sats Restrictions Weight Bearing Restrictions: No       Mobility Bed Mobility Overal bed mobility: Modified Independent                Transfers Overall transfer level: Modified independent Equipment used: None Transfers: Sit to/from Stand                Balance Overall balance assessment: Modified Independent                                 ADL Overall ADL's : Modified independent                                       General ADL Comments: Educated pt on fall prevention and home safety strategies to reduce fall risk at home. Also discussed pt's various options as she reports concern about maintaining her large house with sporadic assistance from her daughter.      Vision                     Perception     Praxis      Cognition    Behavior During Therapy: WFL for tasks assessed/performed Overall Cognitive Status: Within Functional Limits for tasks assessed                       Extremity/Trunk Assessment               Exercises     Shoulder Instructions       General Comments      Pertinent Vitals/ Pain       Pain Assessment: Faces Faces Pain Scale: Hurts little more Pain Location: head Pain Descriptors / Indicators: Headache Pain Intervention(s): Monitored during session;Repositioned  Home Living                                          Prior Functioning/Environment              Frequency       Progress Toward Goals  OT Goals(current goals can now be found in the care plan section)  Progress towards OT goals: Goals met/education completed, patient discharged from  OT  Acute Rehab OT Goals Patient Stated Goal: return home and care for myself OT Goal Formulation: With patient Time For Goal Achievement: 11/23/15 Potential to Achieve Goals: Good ADL Goals Pt Will Perform Tub/Shower Transfer: Tub transfer;with modified independence;ambulating;shower seat Additional ADL Goal #1: Pt will verbalize 3 low vision strategies to implement at home to increase her independence with ADL/IADL tasks. Additional ADL Goal #2: Pt will verbalize 3 energy conservation strategies to implement at home to increase independence with ADL/IADL tasks.  Plan All goals met and education completed, patient discharged from OT services    Co-evaluation                 End of Session     Activity Tolerance Patient tolerated treatment well   Patient Left in bed;with call bell/phone within reach   Nurse Communication Mobility status        Time: 1526-1550 OT Time Calculation (min): 24 min  Charges: OT General Charges $OT Visit: 1 Procedure OT Treatments $Self Care/Home Management : 23-37 mins  Redmond Baseman, OTR/L Pager: [phone] 11/10/2015, 4:22 PM

## 2015-11-11 DIAGNOSIS — J9621 Acute and chronic respiratory failure with hypoxia: Secondary | ICD-10-CM

## 2015-11-11 LAB — BASIC METABOLIC PANEL
Anion gap: 8 (ref 5–15)
BUN: 46 mg/dL — AB (ref 6–20)
CALCIUM: 8.9 mg/dL (ref 8.9–10.3)
CO2: 23 mmol/L (ref 22–32)
CREATININE: 2.29 mg/dL — AB (ref 0.44–1.00)
Chloride: 100 mmol/L — ABNORMAL LOW (ref 101–111)
GFR calc Af Amer: 21 mL/min — ABNORMAL LOW (ref >60)
GFR, EST NON AFRICAN AMERICAN: 18 mL/min — AB (ref >60)
Glucose, Bld: 122 mg/dL — ABNORMAL HIGH (ref 65–99)
Potassium: 5.3 mmol/L — ABNORMAL HIGH (ref 3.5–5.1)
SODIUM: 131 mmol/L — AB (ref 135–145)

## 2015-11-11 LAB — CBC
HCT: 31.9 % — ABNORMAL LOW (ref 36.0–46.0)
Hemoglobin: 10.1 g/dL — ABNORMAL LOW (ref 12.0–15.0)
MCH: 30.1 pg (ref 26.0–34.0)
MCHC: 31.7 g/dL (ref 30.0–36.0)
MCV: 95.2 fL (ref 78.0–100.0)
PLATELETS: 262 10*3/uL (ref 150–400)
RBC: 3.35 MIL/uL — AB (ref 3.87–5.11)
RDW: 12.5 % (ref 11.5–15.5)
WBC: 7.2 10*3/uL (ref 4.0–10.5)

## 2015-11-11 MED ORDER — AMLODIPINE BESYLATE 5 MG PO TABS
5.0000 mg | ORAL_TABLET | Freq: Every day | ORAL | Status: DC
  Administered 2015-11-11 – 2015-11-13 (×3): 5 mg via ORAL
  Filled 2015-11-11 (×3): qty 1

## 2015-11-11 MED ORDER — IPRATROPIUM-ALBUTEROL 0.5-2.5 (3) MG/3ML IN SOLN
3.0000 mL | RESPIRATORY_TRACT | Status: DC | PRN
  Administered 2015-11-11 – 2015-11-12 (×2): 3 mL via RESPIRATORY_TRACT
  Filled 2015-11-11 (×2): qty 3

## 2015-11-11 NOTE — Progress Notes (Addendum)
PROGRESS NOTE    [name]  VQM:[id number] DOB: [birth year] DOA: 11/08/2015 PCP: Tammy Sit, MD Brief Narrative: [name] is a 82 y.o. female with medical history significant of COPD, not on chronic oxygen, CKD-IV, PAF on Eliquis, diastolic heart failure, sick sinus syndrome status post pacemaker placement, hypertension, GERD, CAD, who presents with productive cough and shortness of breath. Patient says that she has been having cough and shortness of breath for almost 2 weeks, which has been progressively getting worse. No fever, chills or chest pain.  She was started on azithromycin by her pulmonologist and had follow-up 5 days ago with some improvement in her symptoms. States that the next day she began to develop a productive cough, with yellow culture sputum production. Her SOB has also worsened, particularly on exertion. cXR clear, EKG unchanged, troponin normal. Improving with Rx of COPD flare, mild bump in creatinine today  Assessment & Plan:  Acute on chronic respiratory failure with hypoxia due to COPD exacerbation -CXr clear -improving, no wheezing today, cut down solumedrol-change to Prednisone -nebs, doxycycline -supportive care -Ambulatory O2 sats 88% and above  HTN; add Norvasc.   Atrial Fibrillation: CHA2DS2-VASc Score is 6, -rate controlled, continue Eliquis, amiodarone, metoprolol -pacemaker for SSS  Chronic diastolic CHF (congestive heart failure) ([place]): 2-D Echo on 02/26/15 showed EF 55-60% with grade 2 diastolic dysfunction. - Patient has trace leg edema, no JVD, CHF compensated. - holding lasix due to increased cr - continue metoprolol  Aki on CKD4 -baseline creatinine of 1.7, bumped to 2.4 this am, likely due to overdiuresis and got Ibuprofen yesterday for severe unrelenting headache which didn't resolve with tylenol and tramadol Cr trending down. Continue to hold lasix.   -on eliquis for P.Afib, will need to consider alternate anticoagulation if  creatinine doesn't improve by tomorrow  CAD S/P RCA BMS, residual CFX disease: No CP -continue metoprolol and when necessary nitroglycerin  GERD: -Protonix  Headache -acute on chronic, likely worse with steroids -tylenol, tramadol  DVT ppx: On Eliquis Code Status: Full code Family Communication: None at bed side, called and d/w daughter yesterday   Disposition Plan:  home tomorrow if creatinine improving     Subjective: Feeling better. Her PCP came to visit her.  Relates she is breathing better, but not at baseline.    Objective: Vitals:   11/10/15 2034 11/11/15 0508 11/11/15 0742 11/11/15 1306  BP: (!) 147/80 (!) 151/70  (!) 182/80  Pulse: 76 77  61  Resp: 18 18    Temp: 98.3 F (36.8 C) 97.3 F (36.3 C)  97.8 F (36.6 C)  TempSrc: Oral Oral  Oral  SpO2: 94% 95% 96% 96%  Weight:      Height:        Intake/Output Summary (Last 24 hours) at 11/11/15 1828 Last data filed at 11/11/15 0507  Gross per 24 hour  Intake                0 ml  Output              600 ml  Net             -600 ml   Filed Weights   11/08/15 2053 11/09/15 0016  Weight: 72.1 kg (159 lb) 71.6 kg (157 lb 13.6 oz)    Examination:  General exam: Appears calm and comfortable, AAOx3  Respiratory system: good air movement, no wheezing, no crackles Cardiovascular system: S1 & S2 heard, RRR. No JVD, murmurs, rubs, gallops or  clicks. No pedal edema. Gastrointestinal system: Abdomen is nondistended, soft and nontender. Normal bowel sounds heard. Central nervous system: Alert and oriented. No focal neurological deficits. Extremities: Symmetric 5 x 5 power, no edema Skin: No rashes, lesions or ulcers Psychiatry: Judgement and insight appear normal. Mood & affect appropriate.     Data Reviewed: I have personally reviewed following labs and imaging studies  CBC:  Recent Labs Lab 11/08/15 1942 11/10/15 0412 11/11/15 0330  WBC 8.8 10.1 7.2  HGB 11.5* 9.9* 10.1*  HCT 36.7 30.6* 31.9*    MCV 97.9 94.4 95.2  PLT 265 260 161   Basic Metabolic Panel:  Recent Labs Lab 11/08/15 1942 11/10/15 0412 11/10/15 0725 11/10/15 1817 11/11/15 0330  NA 134* 130* 130* 130* 131*  K 4.1 5.5* 5.0 5.3* 5.3*  CL 101 97* 98* 98* 100*  CO2 24 23 22 22 23   GLUCOSE 122* 139* 131* 146* 122*  BUN 24* 41* 43* 47* 46*  CREATININE 1.73* 2.44* 2.47* 2.70* 2.29*  CALCIUM 9.1 9.3 9.3 9.2 8.9   GFR: Estimated Creatinine Clearance: 16.5 mL/min (by C-G formula based on SCr of 2.29 mg/dL (H)). Liver Function Tests: No results for input(s): AST, ALT, ALKPHOS, BILITOT, PROT, ALBUMIN in the last 168 hours. No results for input(s): LIPASE, AMYLASE in the last 168 hours. No results for input(s): AMMONIA in the last 168 hours. Coagulation Profile: No results for input(s): INR, PROTIME in the last 168 hours. Cardiac Enzymes: No results for input(s): CKTOTAL, CKMB, CKMBINDEX, TROPONINI in the last 168 hours. BNP (last 3 results) No results for input(s): PROBNP in the last 8760 hours. HbA1C: No results for input(s): HGBA1C in the last 72 hours. CBG: No results for input(s): GLUCAP in the last 168 hours. Lipid Profile: No results for input(s): CHOL, HDL, LDLCALC, TRIG, CHOLHDL, LDLDIRECT in the last 72 hours. Thyroid Function Tests: No results for input(s): TSH, T4TOTAL, FREET4, T3FREE, THYROIDAB in the last 72 hours. Anemia Panel: No results for input(s): VITAMINB12, FOLATE, FERRITIN, TIBC, IRON, RETICCTPCT in the last 72 hours. Urine analysis:    Component Value Date/Time   COLORURINE YELLOW 06/13/2015 2238   APPEARANCEUR CLOUDY (A) 06/13/2015 2238   LABSPEC 1.012 06/13/2015 2238   PHURINE 5.5 06/13/2015 2238   GLUCOSEU NEGATIVE 06/13/2015 2238   HGBUR NEGATIVE 06/13/2015 2238   HGBUR negative 03/19/2007 1404   BILIRUBINUR NEGATIVE 06/13/2015 2238   KETONESUR NEGATIVE 06/13/2015 2238   PROTEINUR NEGATIVE 06/13/2015 2238   UROBILINOGEN 0.2 05/01/2014 1345   NITRITE NEGATIVE 06/13/2015  2238   LEUKOCYTESUR NEGATIVE 06/13/2015 2238   Sepsis Labs: @LABRCNTIP (procalcitonin:4,lacticidven:4)  ) Recent Results (from the past 240 hour(s))  Culture, blood (routine x 2) Call MD if unable to obtain prior to antibiotics being given     Status: None (Preliminary result)   Collection Time: 11/08/15 11:50 PM  Result Value Ref Range Status   Specimen Description BLOOD LEFT HAND  Final   Special Requests BOTTLES DRAWN AEROBIC AND ANAEROBIC 5ML  Final   Culture NO GROWTH 2 DAYS  Final   Report Status PENDING  Incomplete  Culture, blood (routine x 2) Call MD if unable to obtain prior to antibiotics being given     Status: None (Preliminary result)   Collection Time: 11/08/15 11:58 PM  Result Value Ref Range Status   Specimen Description BLOOD LEFT ARM  Final   Special Requests BOTTLES DRAWN AEROBIC AND ANAEROBIC 5ML  Final   Culture NO GROWTH 2 DAYS  Final   Report Status  PENDING  Incomplete         Radiology Studies: No results found.      Scheduled Meds: . amiodarone  200 mg Oral Daily  . amLODipine  5 mg Oral Daily  . apixaban  2.5 mg Oral BID  . atorvastatin  20 mg Oral Daily  . cholecalciferol  400 Units Oral Daily  . doxycycline  100 mg Oral Q12H  . metoprolol succinate  25 mg Oral Daily  . pantoprazole  40 mg Oral Daily  . predniSONE  40 mg Oral Q breakfast   Continuous Infusions: . sodium chloride 75 mL/hr at 11/11/15 0417     LOS: 2 days    Time spent: 46min    Domenic Polite, MD [facility] Pager [phone]  If 7PM-7AM, please contact night-coverage [email or web address] Password [facility] 11/11/2015, 6:28 PM

## 2015-11-12 LAB — BASIC METABOLIC PANEL
ANION GAP: 4 — AB (ref 5–15)
BUN: 39 mg/dL — AB (ref 6–20)
CHLORIDE: 106 mmol/L (ref 101–111)
CO2: 26 mmol/L (ref 22–32)
Calcium: 9.2 mg/dL (ref 8.9–10.3)
Creatinine, Ser: 1.86 mg/dL — ABNORMAL HIGH (ref 0.44–1.00)
GFR calc Af Amer: 27 mL/min — ABNORMAL LOW (ref >60)
GFR calc non Af Amer: 23 mL/min — ABNORMAL LOW (ref >60)
GLUCOSE: 103 mg/dL — AB (ref 65–99)
POTASSIUM: 5.3 mmol/L — AB (ref 3.5–5.1)
SODIUM: 136 mmol/L (ref 135–145)

## 2015-11-12 MED ORDER — FUROSEMIDE 20 MG PO TABS
20.0000 mg | ORAL_TABLET | Freq: Every day | ORAL | Status: DC
  Administered 2015-11-12 – 2015-11-13 (×2): 20 mg via ORAL
  Filled 2015-11-12 (×2): qty 1

## 2015-11-12 MED ORDER — METHYLPREDNISOLONE SODIUM SUCC 125 MG IJ SOLR
60.0000 mg | Freq: Three times a day (TID) | INTRAMUSCULAR | Status: DC
  Administered 2015-11-12 – 2015-11-13 (×3): 60 mg via INTRAVENOUS
  Filled 2015-11-12 (×3): qty 2

## 2015-11-12 MED ORDER — SODIUM POLYSTYRENE SULFONATE 15 GM/60ML PO SUSP
15.0000 g | Freq: Once | ORAL | Status: AC
  Administered 2015-11-12: 15 g via ORAL
  Filled 2015-11-12: qty 60

## 2015-11-12 NOTE — Care Management Important Message (Signed)
Important Message  Patient Details  Name: [name] MRN: [id number] Date of Birth: September 17, 1923   Medicare Important Message Given:  Yes    Nathen May 11/12/2015, 10:54 AM

## 2015-11-12 NOTE — Progress Notes (Signed)
PROGRESS NOTE    [name]  WRU:[id number] DOB: March 22, 1923 DOA: 11/08/2015 PCP: Acquanetta Sit, MD Brief Narrative: [name] is a 82 y.o. female with medical history significant of COPD, not on chronic oxygen, CKD-IV, PAF on Eliquis, diastolic heart failure, sick sinus syndrome status post pacemaker placement, hypertension, GERD, CAD, who presents with productive cough and shortness of breath. Patient says that she has been having cough and shortness of breath for almost 2 weeks, which has been progressively getting worse. No fever, chills or chest pain.  She was started on azithromycin by her pulmonologist and had follow-up 5 days ago with some improvement in her symptoms. States that the next day she began to develop a productive cough, with yellow culture sputum production. Her SOB has also worsened, particularly on exertion. cXR clear, EKG unchanged, troponin normal. Improving with Rx of COPD flare, mild bump in creatinine today  Assessment & Plan:  Acute on chronic respiratory failure with hypoxia due to COPD exacerbation -CXr clear -had an episode of SOB last night, wheezing Bilateral on lung exam.  -will change prednisone back to solumedrol.  -will consider start low dose of lasix. Will get weight first.  -nebs, doxycycline -supportive care -Ambulatory O2 sats 88% and above  HTN; added Norvasc. BP better.   Atrial Fibrillation: CHA2DS2-VASc Score is 6, -rate controlled, continue Eliquis, amiodarone, metoprolol -pacemaker for SSS  Chronic diastolic CHF (congestive heart failure) ([place]): 2-D Echo on 02/26/15 showed EF 55-60% with grade 2 diastolic dysfunction. - Patient has trace leg edema, no JVD, CHF compensated. - will probably resume low dose lasix today  - continue metoprolol  Aki on CKD4 -baseline creatinine of 1.7, bumped to 2.4 this am, likely due to overdiuresis and got Ibuprofen yesterday for severe unrelenting headache which didn't resolve with tylenol and  tramadol Cr trending down. Improving.   -on eliquis for P.Afib, will need to consider alternate anticoagulation if creatinine doesn't improve by tomorrow  CAD S/P RCA BMS, residual CFX disease: No CP -continue metoprolol and when necessary nitroglycerin  GERD: -Protonix  Headache -acute on chronic, likely worse with steroids -tylenol, tramadol  DVT ppx: On Eliquis Code Status: Full code Family Communication: None at bed side, called and d/w daughter yesterday   Disposition Plan:  home tomorrow if creatinine improving     Subjective: Feeling ok, report an epidose off SOB last night. Not at baseline yet.   Objective: Vitals:   11/11/15 1929 11/11/15 2143 11/12/15 0435 11/12/15 0524  BP: (!) 154/65   126/63  Pulse: 63  63 68  Resp: 18  18 20   Temp: 97.8 F (36.6 C)   97.9 F (36.6 C)  TempSrc: Oral   Oral  SpO2: 100% 100% 100% 100%  Weight:      Height:        Intake/Output Summary (Last 24 hours) at 11/12/15 1205 Last data filed at 11/12/15 0814  Gross per 24 hour  Intake                0 ml  Output             1400 ml  Net            -1400 ml   Filed Weights   11/08/15 2053 11/09/15 0016  Weight: 72.1 kg (159 lb) 71.6 kg (157 lb 13.6 oz)    Examination:  General exam: Appears calm and comfortable, AAOx3  Respiratory system: Bilateral wheezing. Mild crackles.  Cardiovascular system: S1 &  S2 heard, RRR. No JVD, murmurs, rubs, gallops or clicks. No pedal edema. Gastrointestinal system: Abdomen is nondistended, soft and nontender. Normal bowel sounds heard. Central nervous system: Alert and oriented. No focal neurological deficits. Extremities: Symmetric 5 x 5 power, no edema Skin: No rashes, lesions or ulcers Psychiatry: Judgement and insight appear normal. Mood & affect appropriate.     Data Reviewed: I have personally reviewed following labs and imaging studies  CBC:  Recent Labs Lab 11/08/15 1942 11/10/15 0412 11/11/15 0330  WBC 8.8 10.1  7.2  HGB 11.5* 9.9* 10.1*  HCT 36.7 30.6* 31.9*  MCV 97.9 94.4 95.2  PLT 265 260 725   Basic Metabolic Panel:  Recent Labs Lab 11/10/15 0412 11/10/15 0725 11/10/15 1817 11/11/15 0330 11/12/15 0408  NA 130* 130* 130* 131* 136  K 5.5* 5.0 5.3* 5.3* 5.3*  CL 97* 98* 98* 100* 106  CO2 23 22 22 23 26   GLUCOSE 139* 131* 146* 122* 103*  BUN 41* 43* 47* 46* 39*  CREATININE 2.44* 2.47* 2.70* 2.29* 1.86*  CALCIUM 9.3 9.3 9.2 8.9 9.2   GFR: Estimated Creatinine Clearance: 20.3 mL/min (by C-G formula based on SCr of 1.86 mg/dL (H)). Liver Function Tests: No results for input(s): AST, ALT, ALKPHOS, BILITOT, PROT, ALBUMIN in the last 168 hours. No results for input(s): LIPASE, AMYLASE in the last 168 hours. No results for input(s): AMMONIA in the last 168 hours. Coagulation Profile: No results for input(s): INR, PROTIME in the last 168 hours. Cardiac Enzymes: No results for input(s): CKTOTAL, CKMB, CKMBINDEX, TROPONINI in the last 168 hours. BNP (last 3 results) No results for input(s): PROBNP in the last 8760 hours. HbA1C: No results for input(s): HGBA1C in the last 72 hours. CBG: No results for input(s): GLUCAP in the last 168 hours. Lipid Profile: No results for input(s): CHOL, HDL, LDLCALC, TRIG, CHOLHDL, LDLDIRECT in the last 72 hours. Thyroid Function Tests: No results for input(s): TSH, T4TOTAL, FREET4, T3FREE, THYROIDAB in the last 72 hours. Anemia Panel: No results for input(s): VITAMINB12, FOLATE, FERRITIN, TIBC, IRON, RETICCTPCT in the last 72 hours. Urine analysis:    Component Value Date/Time   COLORURINE YELLOW 06/13/2015 2238   APPEARANCEUR CLOUDY (A) 06/13/2015 2238   LABSPEC 1.012 06/13/2015 2238   PHURINE 5.5 06/13/2015 2238   GLUCOSEU NEGATIVE 06/13/2015 2238   HGBUR NEGATIVE 06/13/2015 2238   HGBUR negative 03/19/2007 1404   BILIRUBINUR NEGATIVE 06/13/2015 2238   KETONESUR NEGATIVE 06/13/2015 2238   PROTEINUR NEGATIVE 06/13/2015 2238   UROBILINOGEN 0.2  05/01/2014 1345   NITRITE NEGATIVE 06/13/2015 2238   LEUKOCYTESUR NEGATIVE 06/13/2015 2238   Sepsis Labs: @LABRCNTIP (procalcitonin:4,lacticidven:4)  ) Recent Results (from the past 240 hour(s))  Culture, blood (routine x 2) Call MD if unable to obtain prior to antibiotics being given     Status: None (Preliminary result)   Collection Time: 11/08/15 11:50 PM  Result Value Ref Range Status   Specimen Description BLOOD LEFT HAND  Final   Special Requests BOTTLES DRAWN AEROBIC AND ANAEROBIC 5ML  Final   Culture NO GROWTH 2 DAYS  Final   Report Status PENDING  Incomplete  Culture, blood (routine x 2) Call MD if unable to obtain prior to antibiotics being given     Status: None (Preliminary result)   Collection Time: 11/08/15 11:58 PM  Result Value Ref Range Status   Specimen Description BLOOD LEFT ARM  Final   Special Requests BOTTLES DRAWN AEROBIC AND ANAEROBIC 5ML  Final   Culture NO GROWTH  2 DAYS  Final   Report Status PENDING  Incomplete         Radiology Studies: No results found.      Scheduled Meds: . amiodarone  200 mg Oral Daily  . amLODipine  5 mg Oral Daily  . apixaban  2.5 mg Oral BID  . atorvastatin  20 mg Oral Daily  . cholecalciferol  400 Units Oral Daily  . doxycycline  100 mg Oral Q12H  . methylPREDNISolone (SOLU-MEDROL) injection  60 mg Intravenous Q8H  . metoprolol succinate  25 mg Oral Daily  . pantoprazole  40 mg Oral Daily   Continuous Infusions:     LOS: 3 days    Time spent: 57min    Niel Hummer , MD [facility] Pager [phone]  If 7PM-7AM, please contact night-coverage [email or web address] Password [facility] 11/12/2015, 12:05 PM

## 2015-11-12 NOTE — Care Management Important Message (Signed)
Important Message  Patient Details  Name: [name] MRN: [id number] Date of Birth: 1923/06/07   Medicare Important Message Given:  Yes    [name] May 11/12/2015, 10:54 AM

## 2015-11-13 LAB — BASIC METABOLIC PANEL
Anion gap: 5 (ref 5–15)
BUN: 38 mg/dL — AB (ref 6–20)
CHLORIDE: 104 mmol/L (ref 101–111)
CO2: 29 mmol/L (ref 22–32)
CREATININE: 1.8 mg/dL — AB (ref 0.44–1.00)
Calcium: 9.3 mg/dL (ref 8.9–10.3)
GFR calc non Af Amer: 24 mL/min — ABNORMAL LOW (ref >60)
GFR, EST AFRICAN AMERICAN: 28 mL/min — AB (ref >60)
Glucose, Bld: 147 mg/dL — ABNORMAL HIGH (ref 65–99)
POTASSIUM: 5 mmol/L (ref 3.5–5.1)
Sodium: 138 mmol/L (ref 135–145)

## 2015-11-13 MED ORDER — DM-GUAIFENESIN ER 30-600 MG PO TB12: 1.0000 | ORAL_TABLET | Freq: Two times a day (BID) | ORAL | 0 refills | Status: DC | PRN

## 2015-11-13 MED ORDER — AMLODIPINE BESYLATE 5 MG PO TABS: 5.0000 mg | ORAL_TABLET | Freq: Every day | ORAL | 0 refills | Status: DC

## 2015-11-13 MED ORDER — DOXYCYCLINE HYCLATE 100 MG PO TABS: 100.0000 mg | ORAL_TABLET | Freq: Two times a day (BID) | ORAL | 0 refills | Status: DC

## 2015-11-13 NOTE — Progress Notes (Signed)
Physical Therapy Treatment Patient Details Name: SKIE VITRANO MRN: [id number] DOB: 09-30-1923 Today's Date: 11/13/2015    History of Present Illness 82 yo admitted with SOB and acute on chronic respiratory distress. PMHx: CoPD, CAD, pacemaker, HTN, CKD, AFib    PT Comments    Pt educated for energy conservation, lung exercises including pursed lip breathing and diaphragmatic breathing. Pt encouraged to continue walking program and HEP at home. Pt also educated for long term modification of one level house given DOE 2-3/4 throughout limited gait today and pt does not have endurance for 2 flights of stairs she currently has in her home. Pt does still plan to stay with daughter in single story home. Will continue to follow.   Sats 96% on RA at rest On RA with ambulation 90-95% HR 87  Follow Up Recommendations  Home health PT     Equipment Recommendations       Recommendations for Other Services       Precautions / Restrictions Precautions Precautions: Fall    Mobility  Bed Mobility               General bed mobility comments: EOB on arrival  Transfers Overall transfer level: Modified independent                  Ambulation/Gait Ambulation/Gait assistance: Supervision Ambulation Distance (Feet): 150 Feet Assistive device: None Gait Pattern/deviations: Step-through pattern;Decreased stride length   Gait velocity interpretation: at or above normal speed for age/gender General Gait Details: cues for breathing technique and energy conservation with 3 standing rest breaks needed during gait but able to maintain sats 90-95% on RA throughout gait   Stairs            Wheelchair Mobility    Modified Rankin (Stroke Patients Only)       Balance                                    Cognition Arousal/Alertness: Awake/alert Behavior During Therapy: WFL for tasks assessed/performed Overall Cognitive Status: Within Functional Limits for tasks  assessed                      Exercises General Exercises - Lower Extremity Long Arc Quad: AROM;20 reps;Both;Seated Hip Flexion/Marching: AROM;20 reps;Both;Seated Toe Raises: AROM;Both;Seated;20 reps    General Comments        Pertinent Vitals/Pain Pain Assessment: No/denies pain    Home Living                      Prior Function            PT Goals (current goals can now be found in the care plan section) Progress towards PT goals: Progressing toward goals    Frequency   PT Diagnosis      Chronic obstructive pulmonary disease, unspecified COPD type (HCC)  COPD exacerbation (HCC) - Plan: AMB Referral to [place] Management  Chronic kidney disease (CKD), stage IV (severe) (HCC) - Plan: AMB Referral to [place] Management  AF (paroxysmal atrial fibrillation) ([place]) - Plan: AMB Referral to [place] Management  Chronic diastolic CHF (congestive heart failure) ([place]) - Plan: AMB Referral to [facility] Care Management  COPD, severe ([place]) - Plan: AMB Referral to [place] Management  Other abnormalities of gait and mobility  Note: gait and mobility abnormality code for this visit only, unable to  remove others at this time   PT Plan Current plan remains appropriate    Co-evaluation             End of Session   Activity Tolerance: Patient tolerated treatment well Patient left: in chair;with call bell/phone within reach     Time: 1610-9604 PT Time Calculation (min) (ACUTE ONLY): 25 min  Charges:  $Gait Training: 8-22 mins $Therapeutic Activity: 8-22 mins                    G Codes:      Melford Aase 08-Dec-2015, 12:43 PM Elwyn Reach, [place]

## 2015-11-13 NOTE — Discharge Summary (Signed)
Physician Discharge Summary  [name] HUD:[id number] DOB: 06-Apr-1923 DOA: 11/08/2015  This patient is follow by [facility] Teaching service.   PCP: Acquanetta Sit, MD.  [facility] Practice teaching service.   Admit date: 11/08/2015 Discharge date: 11/13/2015  Admitted From: Home  Disposition: Home    Recommendations for Outpatient Follow-up:  1. Follow up with PCP in 1-2 weeks 2. Please obtain BMP/CBC in one week 3. Adjust lasix as needed.   Home Health: [facility] RN, PT  Discharge Condition: Stable.  CODE STATUS: Full code.  Diet recommendation: Heart Healthy   Brief/Interim Summary: [name] a 82 y.o.femalewith medical history significant of COPD, not on chronic oxygen, CKD-IV, PAF on Eliquis, diastolic heart failure, sick sinus syndrome status post pacemaker placement, hypertension, GERD, CAD, whopresents with productive cough and shortness of breath. Patient says that she has been having cough and shortness of breath for almost 2 weeks, which has been progressively getting worse. No fever, chills or chest pain. She was started on azithromycin by her pulmonologist and had follow-up 5 days ago with some improvement in her symptoms. States that the next day she began to develop a productive cough, withyellow culture sputum production. Her SOBhas also worsened, particularly on exertion. cXR clear, EKG unchanged, troponin normal. Improving with Rx of COPD flare, mild bump in creatinine today.   Acute on chronic respiratory failure with hypoxia due to COPD exacerbation -CXr clear -had an episode of SOB last night, wheezing Bilateral on lung exam.  -improved discharge on prednisone.  -resume oral lasix.  -nebs, doxycycline -supportive care -Ambulatory O2 sats 88% and above  HTN; added Norvasc. BP better.   Atrial Fibrillation: CHA2DS2-VASc Scoreis 6, -rate controlled, continue Eliquis, amiodarone, metoprolol -pacemaker for SSS  Chronic diastolic CHF (congestive  heart failure) ([place]): 2-D Kreg Shro[place] 02/26/15 showed EF 55-60% with grade 2 diastolic dysfunction. - Patient has trace leg edema, no JVD, CHF compensated. - tolerating 20 mg of lasix.  - continue metoprolol  Aki on CKD4 -baseline creatinine of 1.7, bumped to 2.4 this am, likely due to overdiuresis and got Ibuprofen yesterday for severe unrelenting headache which didn't resolve with tylenol and tramadol Cr trending down. Stable at 1.8  -on eliquis for P.Afib, will need to consider alternate anticoagulation if creatinine doesn't improve by tomorrow  CAD S/P RCA BMS, residual CFX disease: No CP -continue metoprolol and when necessary nitroglycerin  GERD: -Protonix  Headache -acute on chronic, likely worse with steroids -tylenol, tramadol    Discharge Diagnoses:  Principal Problem:   Acute on chronic respiratory failure with hypoxia (HCC) Active Problems:   Chronic kidney disease (CKD), stage IV (severe) (HCC)   AF (paroxysmal atrial fibrillation) (HCC)   Chronic anticoagulation   Pacemaker   CAD S/P RCA BMS, residual CFX disease   Chronic diastolic CHF (congestive heart failure) (HCC)   GERD (gastroesophageal reflux disease)   COPD exacerbation (HCC)    Discharge Instructions  Discharge Instructions    AMB Referral to [place] Management    Complete by:  As directed    Please assign to [place] for COPD disease and symptom management. Written consent obtained. Please call with questions. Likely discharge soon. Marthenia Rolling, [place], [facility] CHYIFOY-[phone]   Reason for consult:  Please assign to [facility] RNCM   Diagnoses of:  COPD/ Pneumonia   Expected date of contact:  1-3 days (reserved for hospital discharges)   Diet - low sodium heart healthy    Complete by:  As directed    Increase activity slowly    Complete by:  As directed        Medication List    STOP taking these medications   potassium chloride 10 MEQ  tablet Commonly known as:  K-DUR,KLOR-CON     TAKE these medications   amiodarone 200 MG tablet Commonly known as:  PACERONE Take 1 tablet (200 mg total) by mouth daily.   amLODipine 5 MG tablet Commonly known as:  NORVASC Take 1 tablet (5 mg total) by mouth daily. Start taking on:  11/14/2015   apixaban 2.5 MG Tabs tablet Commonly known as:  ELIQUIS Take 1 tablet (2.5 mg total) by mouth 2 (two) times daily.   atorvastatin 20 MG tablet Commonly known as:  LIPITOR Take 1 tablet (20 mg total) by mouth daily.   cholecalciferol 400 units Tabs tablet Commonly known as:  VITAMIN D Take 400 Units by mouth daily.   dextromethorphan-guaiFENesin 30-600 MG 12hr tablet Commonly known as:  MUCINEX DM Take 1 tablet by mouth 2 (two) times daily as needed for cough.   diclofenac sodium 1 % Gel Commonly known as:  VOLTAREN Apply 2 g topically 4 (four) times daily.   docusate sodium 100 MG capsule Commonly known as:  COLACE Take 100 mg by mouth daily as needed for mild constipation. Reported on 07/08/2015   doxycycline 100 MG tablet Commonly known as:  VIBRA-TABS Take 1 tablet (100 mg total) by mouth every 12 (twelve) hours.   Fluticasone-Salmeterol 250-50 MCG/DOSE Aepb Commonly known as:  ADVAIR DISKUS Inhale 2 puffs into the lungs 2 (two) times daily.   furosemide 40 MG tablet Commonly known as:  LASIX Take 0.5 tablets (20 mg total) by mouth daily. What changed:  when to take this   metoprolol succinate 25 MG 24 hr tablet Commonly known as:  TOPROL-XL Take 1 tablet (25 mg total) by mouth daily.   nitroGLYCERIN 0.4 MG SL tablet Commonly known as:  NITROSTAT Place 0.4 mg under the tongue every 5 (five) minutes as needed for chest pain (x 3 tabs).   omeprazole 20 MG capsule Commonly known as:  PRILOSEC Take 1 capsule (20 mg total) by mouth daily.   tiotropium 18 MCG inhalation capsule Commonly known as:  SPIRIVA Place 1 capsule (18 mcg total) into inhaler and inhale  daily.   VENTOLIN HFA 108 (90 Base) MCG/ACT inhaler Generic drug:  albuterol INHALE 2 PUFFS WITH SPACER EVERY 4 HOURS AS NEEDED FOR WHEEZING   vitamin B-12 1000 MCG tablet Commonly known as:  CYANOCOBALAMIN Take 1,000 mcg by mouth daily.       Allergies  Allergen Reactions  . Baclofen Other (See Comments)    Confusion occurred with taking 3 at the same time.  . Codeine Phosphate Nausea Only  . Norco [Hydrocodone-Acetaminophen] Nausea And Vomiting  . Hydrochlorothiazide Other (See Comments)    Possible acute gout or arthritis of wrist  . Montelukast Sodium Other (See Comments)    Unspecified reaction.     Consultations:  none   Procedures/Studies: Dg Chest 2 View  Result Date: 11/08/2015 CLINICAL DATA:  Cough. EXAM: CHEST  2 VIEW COMPARISON:  Multiple chest x-rays since 2014. FINDINGS: The focal opacity in the left mid lung, just lateral to the hilum, has been present since 2014 consistent with a chronic etiology, likely scarring. There is atelectasis or scar in the left retrocardiac region/base, unchanged. No pneumothorax. Stable pacemaker. Stable cardiomediastinal silhouette. No other interval changes or acute abnormalities. IMPRESSION: No  acute change. Electronically Signed   By: Dorise Bullion III M.D   On: 11/08/2015 17:49       Subjective: She is feeling better, wants to go home    Discharge Exam: Vitals:   11/12/15 2008 11/13/15 0427  BP: 133/73 131/67  Pulse: 61 62  Resp: 18 20  Temp: 98.4 F (36.9 C) 98 F (36.7 C)   Vitals:   11/12/15 1245 11/12/15 1531 11/12/15 2008 11/13/15 0427  BP:  (!) 149/77 133/73 131/67  Pulse:  65 61 62  Resp:  18 18 20   Temp:  97.8 F (36.6 C) 98.4 F (36.9 C) 98 F (36.7 C)  TempSrc:  Oral Oral Oral  SpO2:  100% 94% 94%  Weight: 75.1 kg (165 lb 8 oz)   75 kg (165 lb 5.5 oz)  Height:        General: Pt is alert, awake, not in acute distress Cardiovascular: RRR, S1/S2 +, no rubs, no gallops Respiratory: CTA  bilaterally, no wheezing, no rhonchi Abdominal: Soft, NT, ND, bowel sounds + Extremities: no edema, no cyanosis    The results of significant diagnostics from this hospitalization (including imaging, microbiology, ancillary and laboratory) are listed below for reference.     Microbiology: Recent Results (from the past 240 hour(s))  Culture, blood (routine x 2) Call MD if unable to obtain prior to antibiotics being given     Status: None (Preliminary result)   Collection Time: 11/08/15 11:50 PM  Result Value Ref Range Status   Specimen Description BLOOD LEFT HAND  Final   Special Requests BOTTLES DRAWN AEROBIC AND ANAEROBIC 5ML  Final   Culture NO GROWTH 3 DAYS  Final   Report Status PENDING  Incomplete  Culture, blood (routine x 2) Call MD if unable to obtain prior to antibiotics being given     Status: None (Preliminary result)   Collection Time: 11/08/15 11:58 PM  Result Value Ref Range Status   Specimen Description BLOOD LEFT ARM  Final   Special Requests BOTTLES DRAWN AEROBIC AND ANAEROBIC 5ML  Final   Culture NO GROWTH 3 DAYS  Final   Report Status PENDING  Incomplete     Labs: BNP (last 3 results)  Recent Labs  06/17/15 1519 07/30/15 1231 11/09/15 0100  BNP 739.4* 335.4* 500.9*   Basic Metabolic Panel:  Recent Labs Lab 11/10/15 0725 11/10/15 1817 11/11/15 0330 11/12/15 0408 11/13/15 0318  NA 130* 130* 131* 136 138  K 5.0 5.3* 5.3* 5.3* 5.0  CL 98* 98* 100* 106 104  CO2 22 22 23 26 29   GLUCOSE 131* 146* 122* 103* 147*  BUN 43* 47* 46* 39* 38*  CREATININE 2.47* 2.70* 2.29* 1.86* 1.80*  CALCIUM 9.3 9.2 8.9 9.2 9.3   Liver Function Tests: No results for input(s): AST, ALT, ALKPHOS, BILITOT, PROT, ALBUMIN in the last 168 hours. No results for input(s): LIPASE, AMYLASE in the last 168 hours. No results for input(s): AMMONIA in the last 168 hours. CBC:  Recent Labs Lab 11/08/15 1942 11/10/15 0412 11/11/15 0330  WBC 8.8 10.1 7.2  HGB 11.5* 9.9* 10.1*   HCT 36.7 30.6* 31.9*  MCV 97.9 94.4 95.2  PLT 265 260 262   Cardiac Enzymes: No results for input(s): CKTOTAL, CKMB, CKMBINDEX, TROPONINI in the last 168 hours. BNP: Invalid input(s): POCBNP CBG: No results for input(s): GLUCAP in the last 168 hours. D-Dimer No results for input(s): DDIMER in the last 72 hours. Hgb A1c No results for input(s): HGBA1C in the  last 72 hours. Lipid Profile No results for input(s): CHOL, HDL, LDLCALC, TRIG, CHOLHDL, LDLDIRECT in the last 72 hours. Thyroid function studies No results for input(s): TSH, T4TOTAL, T3FREE, THYROIDAB in the last 72 hours.  Invalid input(s): FREET3 Anemia work up No results for input(s): VITAMINB12, FOLATE, FERRITIN, TIBC, IRON, RETICCTPCT in the last 72 hours. Urinalysis    Component Value Date/Time   COLORURINE YELLOW 06/13/2015 2238   APPEARANCEUR CLOUDY (A) 06/13/2015 2238   LABSPEC 1.012 06/13/2015 2238   PHURINE 5.5 06/13/2015 2238   GLUCOSEU NEGATIVE 06/13/2015 2238   HGBUR NEGATIVE 06/13/2015 2238   HGBUR negative 03/19/2007 1404   BILIRUBINUR NEGATIVE 06/13/2015 2238   KETONESUR NEGATIVE 06/13/2015 2238   PROTEINUR NEGATIVE 06/13/2015 2238   UROBILINOGEN 0.2 05/01/2014 1345   NITRITE NEGATIVE 06/13/2015 2238   LEUKOCYTESUR NEGATIVE 06/13/2015 2238   Sepsis Labs Invalid input(s): PROCALCITONIN,  WBC,  LACTICIDVEN Microbiology Recent Results (from the past 240 hour(s))  Culture, blood (routine x 2) Call MD if unable to obtain prior to antibiotics being given     Status: None (Preliminary result)   Collection Time: 11/08/15 11:50 PM  Result Value Ref Range Status   Specimen Description BLOOD LEFT HAND  Final   Special Requests BOTTLES DRAWN AEROBIC AND ANAEROBIC 5ML  Final   Culture NO GROWTH 3 DAYS  Final   Report Status PENDING  Incomplete  Culture, blood (routine x 2) Call MD if unable to obtain prior to antibiotics being given     Status: None (Preliminary result)   Collection Time: 11/08/15 11:58  PM  Result Value Ref Range Status   Specimen Description BLOOD LEFT ARM  Final   Special Requests BOTTLES DRAWN AEROBIC AND ANAEROBIC 5ML  Final   Culture NO GROWTH 3 DAYS  Final   Report Status PENDING  Incomplete     Time coordinating discharge: Over 30 minutes  SIGNED:   Elmarie Shiley, MD  [facility] 11/13/2015, 12:06 PM Pager   If 7PM-7AM, please contact night-coverage [email or web address] Password [email or web address]

## 2015-11-13 NOTE — Progress Notes (Signed)
Discharge education complete.   Call placed to PCP and appointment made for 11/16/2015.  All questions answered.  Pt denies sob.  Pt stable to discharge.

## 2015-11-13 NOTE — Care Management Note (Signed)
Case Management Note Marvetta Gibbons RN, BSN Unit 2W-Case Manager [phone]  Patient Details  Name: [name] MRN: [id number] Date of Birth: 15-Jan-1924  Subjective/Objective:   Pt admitted with acute on chronic resp. Failure- COPD                 Action/Plan: PTA pt lived at home- per PT eval - recommendations for HHPT- pt would also benefit from [facility] for COPD management- spoke with pt at bedside- she states she has had [place] in the past with [facility]- she reports that if she needs HH again she would want to use [facility] again for services- referral has also been made for [facility] services to assist in community COPD management. CM to follow for [facility] needs if orders are written for discharge will f/u with pt to arrange with [facility].   Expected Discharge Date:    11/13/15              Expected Discharge Plan:  [name]  In-House Referral:     Discharge planning Services  CM Consult  Post Acute Care Choice:    Choice offered to:  Patient  DME Arranged:    DME Agency:     HH Arranged:  RN, PT, Disease Management [place] Agency:  [place]  Status of Service:  Completed, signed off  If discussed at [place] of Stay Meetings, dates discussed:    Additional Comments:  11/13/15- 1230- Davarion Cuffee rN, CM- pt for d/c home today, f/u done with pt regarding [facility] services- pt states she would be agreeable to that also plans to have [facility] f/u with her- pt still states that she would like [facility] for services- orders have been placed for HHRN/PT - referral called to Santiago Glad with [facility] for HHRN/PT- COPD management. Pt already has home 02 with [facility].   Dawayne Patricia, RN 11/13/2015, 2:19 PM

## 2015-11-13 NOTE — Progress Notes (Signed)
Order received to discharge Pt.  Telemetry removed and CCMD notified. IV removed with catheter intact.  Discharge education given to Pt, handout given on amlodipine and doxycycline.  Pt indicates understanding.  Pt requests prescriptions be called to [facility] on [address].  Call placed to pharmacist and prescription information given.  Pt waiting for daughter to complete discharge education.  Will cont to monitor.

## 2015-11-14 ENCOUNTER — Telehealth: Payer: Self-pay | Admitting: Pulmonary Disease

## 2015-11-14 DIAGNOSIS — I130 Hypertensive heart and chronic kidney disease with heart failure and stage 1 through stage 4 chronic kidney disease, or unspecified chronic kidney disease: Secondary | ICD-10-CM | POA: Diagnosis not present

## 2015-11-14 DIAGNOSIS — J441 Chronic obstructive pulmonary disease with (acute) exacerbation: Secondary | ICD-10-CM | POA: Diagnosis not present

## 2015-11-14 DIAGNOSIS — K219 Gastro-esophageal reflux disease without esophagitis: Secondary | ICD-10-CM | POA: Diagnosis not present

## 2015-11-14 DIAGNOSIS — N184 Chronic kidney disease, stage 4 (severe): Secondary | ICD-10-CM | POA: Diagnosis not present

## 2015-11-14 DIAGNOSIS — Z950 Presence of cardiac pacemaker: Secondary | ICD-10-CM | POA: Diagnosis not present

## 2015-11-14 DIAGNOSIS — Z87891 Personal history of nicotine dependence: Secondary | ICD-10-CM | POA: Diagnosis not present

## 2015-11-14 DIAGNOSIS — I5032 Chronic diastolic (congestive) heart failure: Secondary | ICD-10-CM | POA: Diagnosis not present

## 2015-11-14 DIAGNOSIS — I2510 Atherosclerotic heart disease of native coronary artery without angina pectoris: Secondary | ICD-10-CM | POA: Diagnosis not present

## 2015-11-14 DIAGNOSIS — I482 Chronic atrial fibrillation: Secondary | ICD-10-CM | POA: Diagnosis not present

## 2015-11-14 DIAGNOSIS — Z96652 Presence of left artificial knee joint: Secondary | ICD-10-CM | POA: Diagnosis not present

## 2015-11-14 LAB — CULTURE, BLOOD (ROUTINE X 2)
CULTURE: NO GROWTH
CULTURE: NO GROWTH

## 2015-11-14 NOTE — Telephone Encounter (Signed)
Gave Rx to [facility] RN for alb nebs q 6h prn wheezing (verbal order) RB pt, discharged from [facility] 9/15

## 2015-11-16 ENCOUNTER — Encounter: Payer: Self-pay | Admitting: Internal Medicine

## 2015-11-16 ENCOUNTER — Other Ambulatory Visit: Payer: Self-pay

## 2015-11-16 ENCOUNTER — Ambulatory Visit (INDEPENDENT_AMBULATORY_CARE_PROVIDER_SITE_OTHER): Payer: Medicare Other | Admitting: Family Medicine

## 2015-11-16 VITALS — BP 127/65 | HR 86 | Temp 98.2°F | Wt 160.0 lb

## 2015-11-16 DIAGNOSIS — I25119 Atherosclerotic heart disease of native coronary artery with unspecified angina pectoris: Secondary | ICD-10-CM

## 2015-11-16 DIAGNOSIS — I5032 Chronic diastolic (congestive) heart failure: Secondary | ICD-10-CM | POA: Diagnosis not present

## 2015-11-16 DIAGNOSIS — N184 Chronic kidney disease, stage 4 (severe): Secondary | ICD-10-CM

## 2015-11-16 DIAGNOSIS — Z23 Encounter for immunization: Secondary | ICD-10-CM

## 2015-11-16 DIAGNOSIS — I480 Paroxysmal atrial fibrillation: Secondary | ICD-10-CM | POA: Diagnosis not present

## 2015-11-16 DIAGNOSIS — N2581 Secondary hyperparathyroidism of renal origin: Secondary | ICD-10-CM | POA: Diagnosis not present

## 2015-11-16 DIAGNOSIS — J441 Chronic obstructive pulmonary disease with (acute) exacerbation: Secondary | ICD-10-CM

## 2015-11-16 DIAGNOSIS — Z905 Acquired absence of kidney: Secondary | ICD-10-CM | POA: Diagnosis not present

## 2015-11-16 DIAGNOSIS — F410 Panic disorder [episodic paroxysmal anxiety] without agoraphobia: Secondary | ICD-10-CM | POA: Diagnosis not present

## 2015-11-16 DIAGNOSIS — F064 Anxiety disorder due to known physiological condition: Secondary | ICD-10-CM

## 2015-11-16 MED ORDER — NITROGLYCERIN 0.4 MG SL SUBL: 0.4000 mg | SUBLINGUAL_TABLET | SUBLINGUAL | 99 refills | Status: AC | PRN

## 2015-11-16 NOTE — Assessment & Plan Note (Signed)
Established problem. Stable. Continue current therapy of Eliquis as long as patient' GFR remains above 15 ml/min.

## 2015-11-16 NOTE — Assessment & Plan Note (Signed)
Established problem. Stable. Continue current therapy of Lasix 40 mg tablet, half tablet (20 mg) in morning. Await BMET to assess Creatine Stopped Potassium supplement given eGFR in 20's and serum K+ about 5.

## 2015-11-16 NOTE — Assessment & Plan Note (Signed)
Established problem that has improved.  S/P recent hospitalization 9/10 - 11/13/15 for AE-COPD treatedwith prednisone burst, Doxycycline and SABA txs.  Currently on Spironolactone and Advair with prn albuterol MDI or nebs May add reflumilast to decrease frequency of AECOPD events.  Will discuss on next ov in 2 months.

## 2015-11-16 NOTE — Progress Notes (Signed)
   Subjective:    Patient ID: [name], female    DOB: Mar 30, 1923, 82 y.o.   MRN: [id number] [name] is accompanied by daughter, [name] Sources of clinical information for visit is/are patient, relative(s) and past medical records including Discharge Summary for patient's hospital admission from 9/10 - 11/13/15 on the [facility] service . Nursing assessment for this office visit was reviewed with the patient for accuracy and revision.   HPI  Hospital Follow up Primary diagnosis was Acute Exacerbation of COPD treated with shortacting bronchodilators, shortcourse of prednisone and Doxycycline Lasix was restarted for concern that HFpEF volume congestion was contributing to patient's acute dyspnea.  Patient had brief rise in serum creatinine to 2.4 due to excess diruesis and ibuprofen that improved to 1.8 by discharge.  Pt's potassium supplement was stopped.  Patient is staying with her Hassell Done, for now.  She is fatigued.  Able to walk at slow pace without stopping.  No home oxygen.  No cough. No wheeze. Able to perform her own ADLs.  No leg edema.    Follow up recommendations: CBC/BMP, Adjust Lasix as needed.  Follow up Specialists: Dr Lamonte Sakai (Pulm) 02/11/16 Outpatient Home Health: [place] planned but not started                                          [facility] enrolled during hospitalization for her COPD mngt  Patient's Medication list was reviewed by Dr Miata Culbreth using patient's medications which she brought to the visit. New medication, Amlodipine was started at home by patient.  She reports morning dizziness without vertigo nor near-syncope/syncope.  No chest pain.     SH: Smoking status reviewed.  Review of Systems  No fever No arm or leg weakness     Objective:   Physical Exam VS reviewed GEN: Alert, Cooperative, Groomed, NAD,  COR: RRR, No M/G/R, No JVD, Normal PMI size and location LUNGS: BCTA, No Acc mm use, speaking in full sentences ABDOMEN: (+)BS, soft, NT,  ND, No HSM, No palpable masses EXT: No peripheral leg edema.  Gait: Normal speed, No significant path deviation, Step through +,  Psych: Normal affect/thought/speech/language    Assessment & Plan:  30 minutes face to face where spent in total with counseling / coordination of care took more than 50% of the total time. Counseling involved discussion of the medications, their side effects and indications, also diet and activity. Discussion on diagnosis and natural history of COPD.

## 2015-11-16 NOTE — Assessment & Plan Note (Signed)
Established problem that has improved.  Stopped Lorazepam.  Infrequent use allows stopping without titration.  Monitor respiratory symptoms

## 2015-11-16 NOTE — Patient Instructions (Signed)
Stop taking the amlodipine for now  You are stopping the Ativan (lorazepam) because it can decrease your memory and cause falls with hip fractures.  Refill Nitroglycerin  We are going to check your kidney function, and check your hemoglobin, check your heart function.   Continue your Lasix (furosemide) 40 mg tablet, take a half tablet (20 mg) in the morning.  Will call if we need to change this dose.   Gave you your Flu vaccination.  You are up to date on your Pneumonia vaccinations.  You will not need any further pneumonia vaccinations.

## 2015-11-16 NOTE — Patient Outreach (Signed)
[facility]) Care Management  11/16/2015  [name] 08-20-1923 [id number]  82 year old admitted with COPD 9/10-9/15. RNCM called for transition of care no answer. HIPPA compliant message left.  Plan: continue to attempt to reach member.  Thea Silversmith, RN, MSN, [place] Coordinator Cell: [phone]

## 2015-11-17 ENCOUNTER — Other Ambulatory Visit: Payer: Self-pay

## 2015-11-17 DIAGNOSIS — I130 Hypertensive heart and chronic kidney disease with heart failure and stage 1 through stage 4 chronic kidney disease, or unspecified chronic kidney disease: Secondary | ICD-10-CM | POA: Diagnosis not present

## 2015-11-17 DIAGNOSIS — I482 Chronic atrial fibrillation: Secondary | ICD-10-CM | POA: Diagnosis not present

## 2015-11-17 DIAGNOSIS — J441 Chronic obstructive pulmonary disease with (acute) exacerbation: Secondary | ICD-10-CM | POA: Diagnosis not present

## 2015-11-17 DIAGNOSIS — I5032 Chronic diastolic (congestive) heart failure: Secondary | ICD-10-CM | POA: Diagnosis not present

## 2015-11-17 DIAGNOSIS — I2510 Atherosclerotic heart disease of native coronary artery without angina pectoris: Secondary | ICD-10-CM | POA: Diagnosis not present

## 2015-11-17 DIAGNOSIS — N184 Chronic kidney disease, stage 4 (severe): Secondary | ICD-10-CM | POA: Diagnosis not present

## 2015-11-17 LAB — BASIC METABOLIC PANEL
BUN: 46 mg/dL — AB (ref 7–25)
CHLORIDE: 101 mmol/L (ref 98–110)
CO2: 26 mmol/L (ref 20–31)
CREATININE: 1.73 mg/dL — AB (ref 0.60–0.88)
Calcium: 9 mg/dL (ref 8.6–10.4)
Glucose, Bld: 81 mg/dL (ref 65–99)
POTASSIUM: 3.8 mmol/L (ref 3.5–5.3)
Sodium: 138 mmol/L (ref 135–146)

## 2015-11-17 LAB — PHOSPHORUS: PHOSPHORUS: 3.5 mg/dL (ref 2.1–4.3)

## 2015-11-17 LAB — PARATHYROID HORMONE, INTACT (NO CA): PTH: 94 pg/mL — ABNORMAL HIGH (ref 14–64)

## 2015-11-17 LAB — MAGNESIUM: MAGNESIUM: 1.8 mg/dL (ref 1.5–2.5)

## 2015-11-17 NOTE — Patient Outreach (Signed)
[facility]) Care Management  11/17/2015  [name] August 01, 1923 [id number]  82 year old admitted for COPD exacerbation. RNCM called for transition of care. Member reports she Is doing well. Member states she is not having any difficulty breathing. She has not had to use her rescue inhaler. Member reports home health nursing, physical therapy and occupational therapy are involved. Member denies any questions or concerns.  Plan: continue weekly transition of care engagements. Home visit scheduled.  Thea Silversmith, RN, MSN, [place] Coordinator Cell: [phone]

## 2015-11-20 DIAGNOSIS — I5032 Chronic diastolic (congestive) heart failure: Secondary | ICD-10-CM | POA: Diagnosis not present

## 2015-11-20 DIAGNOSIS — J441 Chronic obstructive pulmonary disease with (acute) exacerbation: Secondary | ICD-10-CM | POA: Diagnosis not present

## 2015-11-20 DIAGNOSIS — I482 Chronic atrial fibrillation: Secondary | ICD-10-CM | POA: Diagnosis not present

## 2015-11-20 DIAGNOSIS — I130 Hypertensive heart and chronic kidney disease with heart failure and stage 1 through stage 4 chronic kidney disease, or unspecified chronic kidney disease: Secondary | ICD-10-CM | POA: Diagnosis not present

## 2015-11-20 DIAGNOSIS — N184 Chronic kidney disease, stage 4 (severe): Secondary | ICD-10-CM | POA: Diagnosis not present

## 2015-11-20 DIAGNOSIS — I2510 Atherosclerotic heart disease of native coronary artery without angina pectoris: Secondary | ICD-10-CM | POA: Diagnosis not present

## 2015-11-24 ENCOUNTER — Other Ambulatory Visit: Payer: Self-pay

## 2015-11-24 ENCOUNTER — Telehealth: Payer: Self-pay | Admitting: *Deleted

## 2015-11-24 ENCOUNTER — Telehealth: Payer: Self-pay | Admitting: Emergency Medicine

## 2015-11-24 DIAGNOSIS — I130 Hypertensive heart and chronic kidney disease with heart failure and stage 1 through stage 4 chronic kidney disease, or unspecified chronic kidney disease: Secondary | ICD-10-CM | POA: Diagnosis not present

## 2015-11-24 DIAGNOSIS — H353221 Exudative age-related macular degeneration, left eye, with active choroidal neovascularization: Secondary | ICD-10-CM | POA: Diagnosis not present

## 2015-11-24 DIAGNOSIS — J449 Chronic obstructive pulmonary disease, unspecified: Secondary | ICD-10-CM

## 2015-11-24 DIAGNOSIS — H353211 Exudative age-related macular degeneration, right eye, with active choroidal neovascularization: Secondary | ICD-10-CM | POA: Diagnosis not present

## 2015-11-24 DIAGNOSIS — I482 Chronic atrial fibrillation: Secondary | ICD-10-CM | POA: Diagnosis not present

## 2015-11-24 DIAGNOSIS — I2510 Atherosclerotic heart disease of native coronary artery without angina pectoris: Secondary | ICD-10-CM | POA: Diagnosis not present

## 2015-11-24 DIAGNOSIS — I5032 Chronic diastolic (congestive) heart failure: Secondary | ICD-10-CM | POA: Diagnosis not present

## 2015-11-24 DIAGNOSIS — J441 Chronic obstructive pulmonary disease with (acute) exacerbation: Secondary | ICD-10-CM | POA: Diagnosis not present

## 2015-11-24 DIAGNOSIS — N184 Chronic kidney disease, stage 4 (severe): Secondary | ICD-10-CM | POA: Diagnosis not present

## 2015-11-24 NOTE — Telephone Encounter (Signed)
Verbal order requested for social worker to come to patient's home and see if they can be of any assistance in her COPD medications.  Will forward to MD for ok and we can call Caitlin back. Jazmin Hartsell,CMA

## 2015-11-24 NOTE — Telephone Encounter (Signed)
Spoke with [place] @ [place]. She saw pt today and states that pt has increased cough with clear mucus, she reports pt sounded mostly clear with minimal expiratory wheeze and some mild ShOB with exertion. She denies any f/n/v. She reports pt is taking medications as directed. She would like to know if you recommend any additional treatment. She has another home visit scheduled for Friday.   RB - Please advise. Thanks!

## 2015-11-24 NOTE — Telephone Encounter (Signed)
LMTCB for Cindy.

## 2015-11-24 NOTE — Telephone Encounter (Signed)
Will order a consultation to [facility] to see if their pharmacists can help patient with obtaining medications at lower cost.  Please let patient know.

## 2015-11-24 NOTE — Telephone Encounter (Signed)
Please make sure she is still on mucinex DM Also ask her to start loratadine 10mg  daily to see if this decreases some of her mucous. Have her call us in the next week to report on how she is doing.

## 2015-11-25 ENCOUNTER — Other Ambulatory Visit: Payer: Self-pay | Admitting: Family Medicine

## 2015-11-25 DIAGNOSIS — K219 Gastro-esophageal reflux disease without esophagitis: Secondary | ICD-10-CM

## 2015-11-25 NOTE — Patient Outreach (Signed)
[facility]) Care Management  11/25/2015  [name] Sep 20, 1923 [id number]  Assessment: 82 year old with recent admission 9/10-9/15. History of atrial fibrillation, heart failure, cardiac issues and COPD. RNCM called to discuss upcoming home visit. Member expresses that she has a lot of appointments and it is hard to keep up with therefore RNCM will not make home visit this week. Telephonic assessment started. Home health continues to be involved.  Member lives alone, but is temporarily living with her daughter post hospitalization. [name] states she will going back to her home soon.  Member reports doing well. Denies any difficulty breathing. Denies any questions or concerns.  Plan: continue weekly transition of care engagements.  Thea Silversmith, RN, MSN, [place] Coordinator Cell: [phone]

## 2015-11-26 ENCOUNTER — Ambulatory Visit: Payer: Medicare Other | Admitting: Family Medicine

## 2015-11-26 ENCOUNTER — Ambulatory Visit: Payer: Self-pay

## 2015-11-26 NOTE — Telephone Encounter (Signed)
lmtcb for [employer]

## 2015-11-27 DIAGNOSIS — J441 Chronic obstructive pulmonary disease with (acute) exacerbation: Secondary | ICD-10-CM | POA: Diagnosis not present

## 2015-11-27 DIAGNOSIS — I2510 Atherosclerotic heart disease of native coronary artery without angina pectoris: Secondary | ICD-10-CM | POA: Diagnosis not present

## 2015-11-27 DIAGNOSIS — I5032 Chronic diastolic (congestive) heart failure: Secondary | ICD-10-CM | POA: Diagnosis not present

## 2015-11-27 DIAGNOSIS — I130 Hypertensive heart and chronic kidney disease with heart failure and stage 1 through stage 4 chronic kidney disease, or unspecified chronic kidney disease: Secondary | ICD-10-CM | POA: Diagnosis not present

## 2015-11-27 DIAGNOSIS — N184 Chronic kidney disease, stage 4 (severe): Secondary | ICD-10-CM | POA: Diagnosis not present

## 2015-11-27 DIAGNOSIS — I482 Chronic atrial fibrillation: Secondary | ICD-10-CM | POA: Diagnosis not present

## 2015-11-27 NOTE — Telephone Encounter (Signed)
Caitlin from [facility] called again. Would like to get verbal orders for social work to help with medication assistance with COPD meds. Pt is currently paying $900 a month. [facility] can not help with this. Call [place] at [phone]. Orders can be left on her confidential VM.

## 2015-11-27 NOTE — Telephone Encounter (Signed)
lvm for pt to return call °

## 2015-11-30 ENCOUNTER — Telehealth: Payer: Self-pay | Admitting: Family Medicine

## 2015-11-30 DIAGNOSIS — I2510 Atherosclerotic heart disease of native coronary artery without angina pectoris: Secondary | ICD-10-CM | POA: Diagnosis not present

## 2015-11-30 DIAGNOSIS — I482 Chronic atrial fibrillation: Secondary | ICD-10-CM | POA: Diagnosis not present

## 2015-11-30 DIAGNOSIS — I5032 Chronic diastolic (congestive) heart failure: Secondary | ICD-10-CM | POA: Diagnosis not present

## 2015-11-30 DIAGNOSIS — I130 Hypertensive heart and chronic kidney disease with heart failure and stage 1 through stage 4 chronic kidney disease, or unspecified chronic kidney disease: Secondary | ICD-10-CM | POA: Diagnosis not present

## 2015-11-30 DIAGNOSIS — N184 Chronic kidney disease, stage 4 (severe): Secondary | ICD-10-CM | POA: Diagnosis not present

## 2015-11-30 DIAGNOSIS — J441 Chronic obstructive pulmonary disease with (acute) exacerbation: Secondary | ICD-10-CM | POA: Diagnosis not present

## 2015-11-30 NOTE — Telephone Encounter (Signed)
Pt is constipated. She has taken 4 pills today and 2 ysterday of Equate Gentle Laxative but with no results. She would like something called in that she could take regurlarly.  Cindy with [facility] says pt has normal bowel sounds, soft abdomen and all vitals are good.  Please advise

## 2015-11-30 NOTE — Telephone Encounter (Signed)
Spoke with Jenny Reichmann and she states that [place] has already received recommendations. Nothing further needed.

## 2015-12-02 ENCOUNTER — Other Ambulatory Visit: Payer: Self-pay | Admitting: Family Medicine

## 2015-12-02 DIAGNOSIS — Z9189 Other specified personal risk factors, not elsewhere classified: Secondary | ICD-10-CM

## 2015-12-02 DIAGNOSIS — E7800 Pure hypercholesterolemia, unspecified: Secondary | ICD-10-CM

## 2015-12-02 DIAGNOSIS — I252 Old myocardial infarction: Secondary | ICD-10-CM

## 2015-12-02 DIAGNOSIS — I2510 Atherosclerotic heart disease of native coronary artery without angina pectoris: Secondary | ICD-10-CM

## 2015-12-02 NOTE — Telephone Encounter (Signed)
Pt stated [facility] has been trying to get in touch with [place] since Monday, regarding a refill on Eliquis. Please advise. Thanks! ep

## 2015-12-02 NOTE — Telephone Encounter (Signed)
Refill request was sent to PCP a couple times.  Awaiting for response from PCP.  Derl Barrow, RN

## 2015-12-02 NOTE — Telephone Encounter (Signed)
3rd request. Martin, Tamika L, RN  

## 2015-12-03 MED ORDER — APIXABAN 2.5 MG PO TABS: 2.5000 mg | ORAL_TABLET | Freq: Two times a day (BID) | ORAL | 5 refills | Status: DC

## 2015-12-03 NOTE — Telephone Encounter (Signed)
Pt is calling again about her Eliquis refill

## 2015-12-03 NOTE — Telephone Encounter (Signed)
Please let pt know:  Recommend restarting Miralax (OTC)  Stop Equate Gentle Laxative Start Senokot two tablets twice a day May increase by one tablet Senokot a day until results

## 2015-12-04 ENCOUNTER — Telehealth: Payer: Self-pay | Admitting: *Deleted

## 2015-12-04 ENCOUNTER — Telehealth: Payer: Self-pay | Admitting: Family Medicine

## 2015-12-04 DIAGNOSIS — I482 Chronic atrial fibrillation: Secondary | ICD-10-CM | POA: Diagnosis not present

## 2015-12-04 DIAGNOSIS — J441 Chronic obstructive pulmonary disease with (acute) exacerbation: Secondary | ICD-10-CM | POA: Diagnosis not present

## 2015-12-04 DIAGNOSIS — I130 Hypertensive heart and chronic kidney disease with heart failure and stage 1 through stage 4 chronic kidney disease, or unspecified chronic kidney disease: Secondary | ICD-10-CM | POA: Diagnosis not present

## 2015-12-04 DIAGNOSIS — I2510 Atherosclerotic heart disease of native coronary artery without angina pectoris: Secondary | ICD-10-CM | POA: Diagnosis not present

## 2015-12-04 DIAGNOSIS — I5032 Chronic diastolic (congestive) heart failure: Secondary | ICD-10-CM | POA: Diagnosis not present

## 2015-12-04 DIAGNOSIS — N184 Chronic kidney disease, stage 4 (severe): Secondary | ICD-10-CM | POA: Diagnosis not present

## 2015-12-04 MED ORDER — ATORVASTATIN CALCIUM 20 MG PO TABS: 20.0000 mg | ORAL_TABLET | Freq: Every day | ORAL | 11 refills | Status: DC

## 2015-12-04 NOTE — Telephone Encounter (Signed)
Pt stated dr Wendy Poet left her a message that he wanted to see her ASAP about her meds. She has an appt Nov 9.  If he needs to see her before then, please let her know.

## 2015-12-04 NOTE — Addendum Note (Signed)
Addended by: Valerie Roys on: 12/04/2015 09:52 AM   Modules accepted: Orders

## 2015-12-04 NOTE — Telephone Encounter (Signed)
Herbert Deaner, Physical Therapist with [facility] called requesting a updated medication list.  Patient informed him that she is now taking Melatonin. She is also using nebulizer machine more often than usual. She is using it 4 times a day. Patient also informed Clair Gulling that there was changing in medication regarding stool softeners.  Jim requested speech therapy for cognitive memory safety.  Per verbal order by Dr. Wendy Poet; agreed to speech therapy.  Patient should schedule an appointment with PCP to discuss usage of nebulizer machine.  Update medication list printed along with office notes regarding the stool softeners.  Faxed to [phone]. Derl Barrow, RN

## 2015-12-04 NOTE — Telephone Encounter (Signed)
Patient is aware of her miralax dosing.  She also needed a refill on her cholesterol medication.  Patient was informed that there was a year's worth given in 04/2015 by Dr. Lovena Le, but per patient pharmacy denied having this script on file.  Resent this medication since patient has been out all week. Jazmin Hartsell,CMA

## 2015-12-04 NOTE — Telephone Encounter (Signed)
Will check with MD to see if patient needs to be seen sooner. Jazmin Hartsell,CMA

## 2015-12-04 NOTE — Telephone Encounter (Signed)
I don't recall telling [name] that I needed to see her ASAP about her meds.  November 9th will be fine for her next appointment.

## 2015-12-04 NOTE — Telephone Encounter (Signed)
Patient is aware of directions and also refilled her cholesterol medication. [name],CMA

## 2015-12-07 ENCOUNTER — Other Ambulatory Visit: Payer: Self-pay

## 2015-12-07 ENCOUNTER — Telehealth: Payer: Self-pay | Admitting: Family Medicine

## 2015-12-07 DIAGNOSIS — I130 Hypertensive heart and chronic kidney disease with heart failure and stage 1 through stage 4 chronic kidney disease, or unspecified chronic kidney disease: Secondary | ICD-10-CM | POA: Diagnosis not present

## 2015-12-07 DIAGNOSIS — I5032 Chronic diastolic (congestive) heart failure: Secondary | ICD-10-CM | POA: Diagnosis not present

## 2015-12-07 DIAGNOSIS — I482 Chronic atrial fibrillation: Secondary | ICD-10-CM | POA: Diagnosis not present

## 2015-12-07 DIAGNOSIS — I2510 Atherosclerotic heart disease of native coronary artery without angina pectoris: Secondary | ICD-10-CM | POA: Diagnosis not present

## 2015-12-07 DIAGNOSIS — N184 Chronic kidney disease, stage 4 (severe): Secondary | ICD-10-CM | POA: Diagnosis not present

## 2015-12-07 DIAGNOSIS — J441 Chronic obstructive pulmonary disease with (acute) exacerbation: Secondary | ICD-10-CM | POA: Diagnosis not present

## 2015-12-07 NOTE — Telephone Encounter (Signed)
Speech therapist: at todays visit, pt reported she had trouble breathing for several hours during the night. She used her breathing exercises but they didn't help. She used her inhaler and that did help. She did not use her nebulizer before going to bed. Her nose and mouth are sore today. She also requests verbal orders to see pt 2 times a week for 3 weeks for stategies for short term memory loss

## 2015-12-07 NOTE — Telephone Encounter (Signed)
LM for patient ok per DPR. [name],CMA  

## 2015-12-08 DIAGNOSIS — J441 Chronic obstructive pulmonary disease with (acute) exacerbation: Secondary | ICD-10-CM | POA: Diagnosis not present

## 2015-12-08 DIAGNOSIS — I130 Hypertensive heart and chronic kidney disease with heart failure and stage 1 through stage 4 chronic kidney disease, or unspecified chronic kidney disease: Secondary | ICD-10-CM | POA: Diagnosis not present

## 2015-12-08 DIAGNOSIS — I5032 Chronic diastolic (congestive) heart failure: Secondary | ICD-10-CM | POA: Diagnosis not present

## 2015-12-08 DIAGNOSIS — I482 Chronic atrial fibrillation: Secondary | ICD-10-CM | POA: Diagnosis not present

## 2015-12-08 DIAGNOSIS — I2510 Atherosclerotic heart disease of native coronary artery without angina pectoris: Secondary | ICD-10-CM | POA: Diagnosis not present

## 2015-12-08 DIAGNOSIS — N184 Chronic kidney disease, stage 4 (severe): Secondary | ICD-10-CM | POA: Diagnosis not present

## 2015-12-08 NOTE — Telephone Encounter (Signed)
Needs refill on her albuterol for her nebulizer. She has a few doses left but doesn't want to run out. [facility] on [address]

## 2015-12-08 NOTE — Telephone Encounter (Signed)
Give verbal orders for speech therapy to see pt 2 times a week for 3 weeks

## 2015-12-08 NOTE — Telephone Encounter (Signed)
Spoke with [employer] and verbal order given.   Neidy Guerrieri,CMA

## 2015-12-09 ENCOUNTER — Telehealth: Payer: Self-pay | Admitting: *Deleted

## 2015-12-09 DIAGNOSIS — I482 Chronic atrial fibrillation: Secondary | ICD-10-CM | POA: Diagnosis not present

## 2015-12-09 DIAGNOSIS — J441 Chronic obstructive pulmonary disease with (acute) exacerbation: Secondary | ICD-10-CM | POA: Diagnosis not present

## 2015-12-09 DIAGNOSIS — I5032 Chronic diastolic (congestive) heart failure: Secondary | ICD-10-CM | POA: Diagnosis not present

## 2015-12-09 DIAGNOSIS — N184 Chronic kidney disease, stage 4 (severe): Secondary | ICD-10-CM | POA: Diagnosis not present

## 2015-12-09 DIAGNOSIS — I130 Hypertensive heart and chronic kidney disease with heart failure and stage 1 through stage 4 chronic kidney disease, or unspecified chronic kidney disease: Secondary | ICD-10-CM | POA: Diagnosis not present

## 2015-12-09 DIAGNOSIS — I2510 Atherosclerotic heart disease of native coronary artery without angina pectoris: Secondary | ICD-10-CM | POA: Diagnosis not present

## 2015-12-09 NOTE — Telephone Encounter (Signed)
Herbert Deaner, PT with [facility] called to request verbal order to continue physical therapy once a week for 2 weeks for fall risk reduction.  Verbal order given.  Derl Barrow, RN

## 2015-12-10 DIAGNOSIS — N184 Chronic kidney disease, stage 4 (severe): Secondary | ICD-10-CM | POA: Diagnosis not present

## 2015-12-10 DIAGNOSIS — I2510 Atherosclerotic heart disease of native coronary artery without angina pectoris: Secondary | ICD-10-CM | POA: Diagnosis not present

## 2015-12-10 DIAGNOSIS — I5032 Chronic diastolic (congestive) heart failure: Secondary | ICD-10-CM | POA: Diagnosis not present

## 2015-12-10 DIAGNOSIS — I482 Chronic atrial fibrillation: Secondary | ICD-10-CM | POA: Diagnosis not present

## 2015-12-10 DIAGNOSIS — J441 Chronic obstructive pulmonary disease with (acute) exacerbation: Secondary | ICD-10-CM | POA: Diagnosis not present

## 2015-12-10 DIAGNOSIS — I130 Hypertensive heart and chronic kidney disease with heart failure and stage 1 through stage 4 chronic kidney disease, or unspecified chronic kidney disease: Secondary | ICD-10-CM | POA: Diagnosis not present

## 2015-12-10 NOTE — Telephone Encounter (Signed)
Tried [employer], but number just continued to ring. Will try to call back later today or tomorrow morning. Arleth Mccullar,CMA

## 2015-12-10 NOTE — Telephone Encounter (Signed)
Please call orders to Herbert Deaner, PT with [facility], to continue physical therapy once a week for 2 weeks for Fall risk reduction

## 2015-12-11 ENCOUNTER — Other Ambulatory Visit: Payer: Self-pay | Admitting: Family Medicine

## 2015-12-11 NOTE — Telephone Encounter (Signed)
Tried contacting Clair Gulling again but there was no answer or voicemail that came on. Will continue to try and call him. Jazmin Hartsell,CMA

## 2015-12-14 ENCOUNTER — Other Ambulatory Visit: Payer: Self-pay

## 2015-12-14 NOTE — Telephone Encounter (Signed)
I have tried calling [name] again with no answer or VM option given.  Will let him know of verbal order if he calls back. Jazmin Hartsell,CMA

## 2015-12-14 NOTE — Patient Outreach (Signed)
[facility]) Care Management  12/14/2015   [name] [birth year] [id number]  Subjective:  I am doing good. I am still at my daughter's house. I am still working with home health to get better.  Objective:  Telephone contact  Current Medications:  Current Outpatient Prescriptions  Medication Sig Dispense Refill  . albuterol (PROVENTIL) (2.5 MG/3ML) 0.083% nebulizer solution USE 1 VIAL VIA NEBULIZER EVERY 6 HOURS AS NEEDED FOR WHEEZING OR SHORTNESS OF BREATH 1350 mL 0  . amiodarone (PACERONE) 200 MG tablet Take 1 tablet (200 mg total) by mouth daily. 90 tablet 3  . apixaban (ELIQUIS) 2.5 MG TABS tablet Take 1 tablet (2.5 mg total) by mouth 2 (two) times daily. 60 tablet 5  . atorvastatin (LIPITOR) 20 MG tablet Take 1 tablet (20 mg total) by mouth daily. 30 tablet 11  . calcium carbonate (OSCAL) 1500 (600 Ca) MG TABS tablet Take 600 mg of elemental calcium by mouth 2 (two) times daily with a meal.    . Cholecalciferol 10000 units TABS Take by mouth.    . dextromethorphan-guaiFENesin (MUCINEX DM) 30-600 MG 12hr tablet Take 1 tablet by mouth 2 (two) times daily as needed for cough. 60 tablet 0  . Fluticasone-Salmeterol (ADVAIR DISKUS) 250-50 MCG/DOSE AEPB Inhale 2 puffs into the lungs 2 (two) times daily. 60 each 11  . furosemide (LASIX) 40 MG tablet Take 0.5 tablets (20 mg total) by mouth daily. (Patient taking differently: Take 20 mg by mouth 2 (two) times a week. ) 30 tablet 1  . Melatonin 3 MG TABS Take by mouth.    . metoprolol succinate (TOPROL-XL) 25 MG 24 hr tablet Take 1 tablet (25 mg total) by mouth daily. 90 tablet 3  . nitroGLYCERIN (NITROSTAT) 0.4 MG SL tablet Place 1 tablet (0.4 mg total) under the tongue every 5 (five) minutes as needed for chest pain (x 3 tabs). 25 tablet PRN  . omeprazole (PRILOSEC) 20 MG capsule TAKE 1 CAPSULE(20 MG) BY MOUTH DAILY 90 capsule 0  . tiotropium (SPIRIVA) 18 MCG inhalation capsule Place 1 capsule (18 mcg total) into inhaler  and inhale daily. 30 capsule PRN  . traMADol (ULTRAM) 50 MG tablet Take 50 mg by mouth every 6 (six) hours as needed.    . VENTOLIN HFA 108 (90 Base) MCG/ACT inhaler INHALE 2 PUFFS WITH SPACER EVERY 4 HOURS AS NEEDED FOR WHEEZING 18 g 5  . vitamin B-12 (CYANOCOBALAMIN) 1000 MCG tablet Take 1,000 mcg by mouth daily.     No current facility-administered medications for this visit.     Functional Status:  In your present state of health, do you have any difficulty performing the following activities: 11/24/2015 11/09/2015  Hearing? N N  Vision? N N  Difficulty concentrating or making decisions? N N  Walking or climbing stairs? Y N  Dressing or bathing? N N  Doing errands, shopping? N N  Preparing Food and eating ? N -  Using the Toilet? N -  In the past six months, have you accidently leaked urine? N -  Do you have problems with loss of bowel control? N -  Managing your Medications? N -  Managing your Finances? N -  Housekeeping or managing your Housekeeping? N -  Some recent data might be hidden    Fall/Depression Screening: PHQ 2/9 Scores 11/24/2015 10/15/2015 09/03/2015 07/30/2015 07/08/2015 05/28/2015 05/14/2015  PHQ - 2 Score 0 0 0 0 0 0 0   Fall Risk  12/14/2015 11/24/2015 10/15/2015 09/03/2015 07/30/2015  Falls in the past year? - No Yes Yes Yes  Number falls in past yr: - - 1 1 1   Injury with Fall? - - No No -  Risk for fall due to : Impaired mobility;Impaired balance/gait;Impaired vision;Medication side effect - - - -  Follow up - - - - -   [facility] CM Care Plan Problem One   Flowsheet Row Most Recent Value  Care Plan Problem One  at risk for readmission/recent hospitalization  Role Documenting the Problem One  Care Management [place] for Problem One  Active  [facility] Long Term Goal (31-90 days)  member will not be readmitted within the next 31 days.  [facility] Long Term Goal Start Date  11/17/15  Interventions for Problem One Long Term Goal  telephone contact for assessment of  patient's progress in meeting her case managment goals  [facility] CM Short Term Goal #1 (0-30 days)  member will call for assistance as needed within the next 30 days.  [facility] CM Short Term Goal #1 Start Date  11/17/15  [facility] CM Short Term Goal #1 Met Date  12/07/15  Interventions for Short Term Goal #1  encouraged member to call as needed.  [facility] CM Short Term Goal #2 (0-30 days)  member will verbalize COPD zone tool and when to call the doctor within the next 30 days.  [facility] CM Short Term Goal #2 Start Date  11/17/15  [facility] CM Short Term Goal #2 Met Date  12/14/15  Interventions for Short Term Goal #2  reinforced member know which doctor to contact and has contact information.     Assessment:  Patient refuses home visit, however, patient has agreed to telephonic contact. Patient states her rationale for home visit refusal is because she stays with her daughter, has too much traffic already. Patient denies swelling, shortness of breath, and endorses compliance with medication regimen.   Plan:  telehphone contact later this month

## 2015-12-15 DIAGNOSIS — J441 Chronic obstructive pulmonary disease with (acute) exacerbation: Secondary | ICD-10-CM | POA: Diagnosis not present

## 2015-12-15 DIAGNOSIS — N184 Chronic kidney disease, stage 4 (severe): Secondary | ICD-10-CM | POA: Diagnosis not present

## 2015-12-15 DIAGNOSIS — I2510 Atherosclerotic heart disease of native coronary artery without angina pectoris: Secondary | ICD-10-CM | POA: Diagnosis not present

## 2015-12-15 DIAGNOSIS — I5032 Chronic diastolic (congestive) heart failure: Secondary | ICD-10-CM | POA: Diagnosis not present

## 2015-12-15 DIAGNOSIS — I482 Chronic atrial fibrillation: Secondary | ICD-10-CM | POA: Diagnosis not present

## 2015-12-15 DIAGNOSIS — I130 Hypertensive heart and chronic kidney disease with heart failure and stage 1 through stage 4 chronic kidney disease, or unspecified chronic kidney disease: Secondary | ICD-10-CM | POA: Diagnosis not present

## 2015-12-16 DIAGNOSIS — I2510 Atherosclerotic heart disease of native coronary artery without angina pectoris: Secondary | ICD-10-CM | POA: Diagnosis not present

## 2015-12-16 DIAGNOSIS — I5032 Chronic diastolic (congestive) heart failure: Secondary | ICD-10-CM | POA: Diagnosis not present

## 2015-12-16 DIAGNOSIS — J441 Chronic obstructive pulmonary disease with (acute) exacerbation: Secondary | ICD-10-CM | POA: Diagnosis not present

## 2015-12-16 DIAGNOSIS — I130 Hypertensive heart and chronic kidney disease with heart failure and stage 1 through stage 4 chronic kidney disease, or unspecified chronic kidney disease: Secondary | ICD-10-CM | POA: Diagnosis not present

## 2015-12-16 DIAGNOSIS — I482 Chronic atrial fibrillation: Secondary | ICD-10-CM | POA: Diagnosis not present

## 2015-12-16 DIAGNOSIS — N184 Chronic kidney disease, stage 4 (severe): Secondary | ICD-10-CM | POA: Diagnosis not present

## 2015-12-17 ENCOUNTER — Telehealth: Payer: Self-pay | Admitting: Family Medicine

## 2015-12-17 DIAGNOSIS — I130 Hypertensive heart and chronic kidney disease with heart failure and stage 1 through stage 4 chronic kidney disease, or unspecified chronic kidney disease: Secondary | ICD-10-CM | POA: Diagnosis not present

## 2015-12-17 DIAGNOSIS — N184 Chronic kidney disease, stage 4 (severe): Secondary | ICD-10-CM | POA: Diagnosis not present

## 2015-12-17 DIAGNOSIS — I2510 Atherosclerotic heart disease of native coronary artery without angina pectoris: Secondary | ICD-10-CM | POA: Diagnosis not present

## 2015-12-17 DIAGNOSIS — I482 Chronic atrial fibrillation: Secondary | ICD-10-CM | POA: Diagnosis not present

## 2015-12-17 DIAGNOSIS — J441 Chronic obstructive pulmonary disease with (acute) exacerbation: Secondary | ICD-10-CM | POA: Diagnosis not present

## 2015-12-17 DIAGNOSIS — I5032 Chronic diastolic (congestive) heart failure: Secondary | ICD-10-CM | POA: Diagnosis not present

## 2015-12-17 NOTE — Telephone Encounter (Signed)
Speech therapist with [facility]: pt has been complaining of having trouble sleeping and having vivid dreams the last few weeks

## 2015-12-21 DIAGNOSIS — J441 Chronic obstructive pulmonary disease with (acute) exacerbation: Secondary | ICD-10-CM | POA: Diagnosis not present

## 2015-12-21 DIAGNOSIS — I2510 Atherosclerotic heart disease of native coronary artery without angina pectoris: Secondary | ICD-10-CM | POA: Diagnosis not present

## 2015-12-21 DIAGNOSIS — I482 Chronic atrial fibrillation: Secondary | ICD-10-CM | POA: Diagnosis not present

## 2015-12-21 DIAGNOSIS — N184 Chronic kidney disease, stage 4 (severe): Secondary | ICD-10-CM | POA: Diagnosis not present

## 2015-12-21 DIAGNOSIS — I5032 Chronic diastolic (congestive) heart failure: Secondary | ICD-10-CM | POA: Diagnosis not present

## 2015-12-21 DIAGNOSIS — I130 Hypertensive heart and chronic kidney disease with heart failure and stage 1 through stage 4 chronic kidney disease, or unspecified chronic kidney disease: Secondary | ICD-10-CM | POA: Diagnosis not present

## 2015-12-22 ENCOUNTER — Telehealth: Payer: Self-pay | Admitting: *Deleted

## 2015-12-22 DIAGNOSIS — I5032 Chronic diastolic (congestive) heart failure: Secondary | ICD-10-CM | POA: Diagnosis not present

## 2015-12-22 DIAGNOSIS — N184 Chronic kidney disease, stage 4 (severe): Secondary | ICD-10-CM | POA: Diagnosis not present

## 2015-12-22 DIAGNOSIS — J441 Chronic obstructive pulmonary disease with (acute) exacerbation: Secondary | ICD-10-CM | POA: Diagnosis not present

## 2015-12-22 DIAGNOSIS — I2510 Atherosclerotic heart disease of native coronary artery without angina pectoris: Secondary | ICD-10-CM | POA: Diagnosis not present

## 2015-12-22 DIAGNOSIS — I130 Hypertensive heart and chronic kidney disease with heart failure and stage 1 through stage 4 chronic kidney disease, or unspecified chronic kidney disease: Secondary | ICD-10-CM | POA: Diagnosis not present

## 2015-12-22 DIAGNOSIS — I482 Chronic atrial fibrillation: Secondary | ICD-10-CM | POA: Diagnosis not present

## 2015-12-22 NOTE — Telephone Encounter (Signed)
Herbert Deaner, Physical Therapist with [facility] called to inform PCP that patient has been discharged from home health physical therapy with all goals met.  Derl Barrow, RN

## 2015-12-23 DIAGNOSIS — H353221 Exudative age-related macular degeneration, left eye, with active choroidal neovascularization: Secondary | ICD-10-CM | POA: Diagnosis not present

## 2015-12-23 DIAGNOSIS — H353211 Exudative age-related macular degeneration, right eye, with active choroidal neovascularization: Secondary | ICD-10-CM | POA: Diagnosis not present

## 2015-12-24 DIAGNOSIS — N184 Chronic kidney disease, stage 4 (severe): Secondary | ICD-10-CM | POA: Diagnosis not present

## 2015-12-24 DIAGNOSIS — J441 Chronic obstructive pulmonary disease with (acute) exacerbation: Secondary | ICD-10-CM | POA: Diagnosis not present

## 2015-12-24 DIAGNOSIS — I482 Chronic atrial fibrillation: Secondary | ICD-10-CM | POA: Diagnosis not present

## 2015-12-24 DIAGNOSIS — I2510 Atherosclerotic heart disease of native coronary artery without angina pectoris: Secondary | ICD-10-CM | POA: Diagnosis not present

## 2015-12-24 DIAGNOSIS — I130 Hypertensive heart and chronic kidney disease with heart failure and stage 1 through stage 4 chronic kidney disease, or unspecified chronic kidney disease: Secondary | ICD-10-CM | POA: Diagnosis not present

## 2015-12-24 DIAGNOSIS — I5032 Chronic diastolic (congestive) heart failure: Secondary | ICD-10-CM | POA: Diagnosis not present

## 2015-12-24 NOTE — Telephone Encounter (Signed)
Pt still needs a refill on albuterol for her nebulizer. [facility] on [address] faxed over paperwork awhile back, they need this in order to refill it. Pt is leaving in two weeks for a cruise. Please advise. Thanks! ep

## 2015-12-25 NOTE — Telephone Encounter (Signed)
Per Pharmacy we will need to complete a CMN form to have albuterol covered.  CMN form is in providers box to be completed and faxed back.  Pt was informed that we need to get authorization from insurance.  She has enough to last 4-5 days and her cruise is on 01/08/16. [place], Salome Spotted, CMA

## 2015-12-25 NOTE — Telephone Encounter (Signed)
Form from [place] signed and fax'd back to [employer]

## 2015-12-28 ENCOUNTER — Other Ambulatory Visit: Payer: Self-pay | Admitting: Family Medicine

## 2015-12-30 DIAGNOSIS — I5032 Chronic diastolic (congestive) heart failure: Secondary | ICD-10-CM | POA: Diagnosis not present

## 2015-12-30 DIAGNOSIS — I2510 Atherosclerotic heart disease of native coronary artery without angina pectoris: Secondary | ICD-10-CM | POA: Diagnosis not present

## 2015-12-30 DIAGNOSIS — I482 Chronic atrial fibrillation: Secondary | ICD-10-CM | POA: Diagnosis not present

## 2015-12-30 DIAGNOSIS — N184 Chronic kidney disease, stage 4 (severe): Secondary | ICD-10-CM | POA: Diagnosis not present

## 2015-12-30 DIAGNOSIS — I130 Hypertensive heart and chronic kidney disease with heart failure and stage 1 through stage 4 chronic kidney disease, or unspecified chronic kidney disease: Secondary | ICD-10-CM | POA: Diagnosis not present

## 2015-12-30 DIAGNOSIS — J441 Chronic obstructive pulmonary disease with (acute) exacerbation: Secondary | ICD-10-CM | POA: Diagnosis not present

## 2015-12-31 DIAGNOSIS — H6121 Impacted cerumen, right ear: Secondary | ICD-10-CM | POA: Diagnosis not present

## 2016-01-06 DIAGNOSIS — I2510 Atherosclerotic heart disease of native coronary artery without angina pectoris: Secondary | ICD-10-CM | POA: Diagnosis not present

## 2016-01-06 DIAGNOSIS — I482 Chronic atrial fibrillation: Secondary | ICD-10-CM | POA: Diagnosis not present

## 2016-01-06 DIAGNOSIS — N184 Chronic kidney disease, stage 4 (severe): Secondary | ICD-10-CM | POA: Diagnosis not present

## 2016-01-06 DIAGNOSIS — I5032 Chronic diastolic (congestive) heart failure: Secondary | ICD-10-CM | POA: Diagnosis not present

## 2016-01-06 DIAGNOSIS — I130 Hypertensive heart and chronic kidney disease with heart failure and stage 1 through stage 4 chronic kidney disease, or unspecified chronic kidney disease: Secondary | ICD-10-CM | POA: Diagnosis not present

## 2016-01-06 DIAGNOSIS — J441 Chronic obstructive pulmonary disease with (acute) exacerbation: Secondary | ICD-10-CM | POA: Diagnosis not present

## 2016-01-07 ENCOUNTER — Encounter: Payer: Self-pay | Admitting: Family Medicine

## 2016-01-07 ENCOUNTER — Ambulatory Visit (INDEPENDENT_AMBULATORY_CARE_PROVIDER_SITE_OTHER): Payer: Medicare Other | Admitting: Family Medicine

## 2016-01-07 VITALS — BP 122/67 | HR 67 | Temp 97.8°F | Wt 161.0 lb

## 2016-01-07 DIAGNOSIS — I25119 Atherosclerotic heart disease of native coronary artery with unspecified angina pectoris: Secondary | ICD-10-CM | POA: Diagnosis not present

## 2016-01-07 DIAGNOSIS — N184 Chronic kidney disease, stage 4 (severe): Secondary | ICD-10-CM

## 2016-01-07 DIAGNOSIS — Z79899 Other long term (current) drug therapy: Secondary | ICD-10-CM

## 2016-01-07 DIAGNOSIS — I480 Paroxysmal atrial fibrillation: Secondary | ICD-10-CM

## 2016-01-07 LAB — CBC
HCT: 34.5 % — ABNORMAL LOW (ref 35.0–45.0)
Hemoglobin: 10.6 g/dL — ABNORMAL LOW (ref 11.7–15.5)
MCH: 29.6 pg (ref 27.0–33.0)
MCHC: 30.7 g/dL — AB (ref 32.0–36.0)
MCV: 96.4 fL (ref 80.0–100.0)
MPV: 9.7 fL (ref 7.5–12.5)
PLATELETS: 250 10*3/uL (ref 140–400)
RBC: 3.58 MIL/uL — AB (ref 3.80–5.10)
RDW: 14.4 % (ref 11.0–15.0)
WBC: 5.1 10*3/uL (ref 3.8–10.8)

## 2016-01-07 MED ORDER — PREDNISONE 20 MG PO TABS: ORAL_TABLET | ORAL | 0 refills | Status: DC

## 2016-01-07 MED ORDER — AZITHROMYCIN 250 MG PO TABS: ORAL_TABLET | ORAL | 0 refills | Status: DC

## 2016-01-07 NOTE — Patient Instructions (Addendum)
If your COPD flares up on trip, start your Azithromycin antibiotic and Prednisone.  Take each for 5 days.   Your lungs sound good today.  Your heart is in a normal rhythm.  Dr Rojelio Uhrich will call you if your tests are not good. Otherwise he will send you a letter.  If you sign up for MyChart online, you will be able to see your test results once Dr Zakyria Metzinger has reviewed them.  If you do not hear from [place] with in 2 weeks please call our office  I want to see you again before Christmas holidays to monitor how you are doing.   Happy Birthday!

## 2016-01-08 ENCOUNTER — Encounter: Payer: Self-pay | Admitting: Family Medicine

## 2016-01-08 LAB — PHOSPHORUS: Phosphorus: 4.2 mg/dL (ref 2.1–4.3)

## 2016-01-08 LAB — BASIC METABOLIC PANEL WITH GFR
BUN: 26 mg/dL — AB (ref 7–25)
CALCIUM: 8.8 mg/dL (ref 8.6–10.4)
CO2: 27 mmol/L (ref 20–31)
Chloride: 102 mmol/L (ref 98–110)
Creat: 1.82 mg/dL — ABNORMAL HIGH (ref 0.60–0.88)
GFR, EST NON AFRICAN AMERICAN: 24 mL/min — AB (ref >=60)
GFR, Est African American: 28 mL/min — ABNORMAL LOW (ref >=60)
GLUCOSE: 93 mg/dL (ref 65–99)
POTASSIUM: 4.5 mmol/L (ref 3.5–5.3)
Sodium: 140 mmol/L (ref 135–146)

## 2016-01-08 LAB — MAGNESIUM: Magnesium: 2 mg/dL (ref 1.5–2.5)

## 2016-01-08 NOTE — Progress Notes (Signed)
   Subjective:    Patient ID: [name], female    DOB: 24-Sep-1923, 82 y.o.   MRN: [id number] [name] is alone Sources of clinical information for visit is/are patient and past medical records. Nursing assessment for this office visit was reviewed with the patient for accuracy and revision.   HPI  COPD - Longstanding problem - Feels like her breathing is the best it has been in months. - Taking Spiriva and Advair daily - Using albuterol about twice a day - Able to climb house stairs with less difficulty - No cough, no sputum, no increase DOE - Going on [place] to [place] tomorrow.   CHRONIC HYPERTENSION  Disease Monitoring  Blood pressure range: not checking at home  Chest pain: no   Dyspnea: no   Claudication: no   Medication compliance: yes Amlodipine and metoprolol Medication Side Effects  Lightheadedness: no   Urinary frequency: no   Edema: no    Preventitive Healthcare:  Exercise: no   Diet Pattern: regular  Salt Restriction: no  Atrial Fibrillation -  Dx 2010 - Taking Eliquis and Metoprolol - Denies palpitation, limb or facial weakness, episodes of difficulty speaking - Denies epistaxis, melena, hematochezia, hemoptysis - No dizziness, no near-syncope.    Medication list updated No smoking  Review of Systems See HPI    Objective:   Physical Exam VS reviewed GEN: Alert, Cooperative, Groomed, NAD COR: RRR, No M/G/R, No JVD, Normal PMI size and location LUNGS: BCTA, No Acc mm use, speaking in full sentences EXT: No peripheral leg edema. Feet without deformity or lesions. Palpable bilateral pedal pulses.  Gait: Normal speed, No significant path deviation, Step through +,  Psych: Normal affect/thought/speech/language    Assessment & Plan:

## 2016-01-08 NOTE — Assessment & Plan Note (Addendum)
Established problem. Stable. Continue current therapy Apixaban and Metoprolol and Amiodarone (followed by Dr Linard Millers, cardiology) Lab Results  Component Value Date   WBC 5.1 01/07/2016   HGB 10.6 (L) 01/07/2016   HCT 34.5 (L) 01/07/2016   MCV 96.4 01/07/2016   PLT 250 01/07/2016  Stable HGB

## 2016-01-08 NOTE — Assessment & Plan Note (Signed)
Lab Results  Component Value Date   CREATININE 1.82 (H) 01/07/2016   BUN 26 (H) 01/07/2016   NA 140 01/07/2016   K 4.5 01/07/2016   CL 102 01/07/2016   CO2 27 01/07/2016  eGFR 23 Normal phosporus and magnesium Slightly elevated iPTH 94 with normal serum calcium. No NSAIDS Not on ACEI/ARB Taking Vitamin D 1000 mcg daily  

## 2016-01-19 DIAGNOSIS — H353221 Exudative age-related macular degeneration, left eye, with active choroidal neovascularization: Secondary | ICD-10-CM | POA: Diagnosis not present

## 2016-01-19 DIAGNOSIS — H353211 Exudative age-related macular degeneration, right eye, with active choroidal neovascularization: Secondary | ICD-10-CM | POA: Diagnosis not present

## 2016-01-27 ENCOUNTER — Ambulatory Visit
Admission: RE | Admit: 2016-01-27 | Discharge: 2016-01-27 | Disposition: A | Discharge status: Home / Self Care | Payer: Medicare Other | Source: Ambulatory Visit | Attending: Family Medicine | Admitting: Family Medicine

## 2016-01-27 ENCOUNTER — Other Ambulatory Visit: Payer: Self-pay | Admitting: Family Medicine

## 2016-01-27 ENCOUNTER — Telehealth: Payer: Self-pay | Admitting: *Deleted

## 2016-01-27 DIAGNOSIS — R058 Other specified cough: Secondary | ICD-10-CM

## 2016-01-27 DIAGNOSIS — R05 Cough: Secondary | ICD-10-CM

## 2016-01-27 NOTE — Telephone Encounter (Signed)
Pharmacist from [facility] called stating PCP called in [employer].  Please give them a call with the dosage and directions at [phone].  Derl Barrow, RN

## 2016-01-28 ENCOUNTER — Other Ambulatory Visit (INDEPENDENT_AMBULATORY_CARE_PROVIDER_SITE_OTHER): Payer: Medicare Other | Admitting: Family Medicine

## 2016-01-28 DIAGNOSIS — R0602 Shortness of breath: Secondary | ICD-10-CM

## 2016-01-28 DIAGNOSIS — J441 Chronic obstructive pulmonary disease with (acute) exacerbation: Secondary | ICD-10-CM

## 2016-01-28 DIAGNOSIS — Z79899 Other long term (current) drug therapy: Secondary | ICD-10-CM

## 2016-01-28 DIAGNOSIS — I25119 Atherosclerotic heart disease of native coronary artery with unspecified angina pectoris: Secondary | ICD-10-CM

## 2016-01-28 MED ORDER — AMOXICILLIN-POT CLAVULANATE 875-125 MG PO TABS: 1.0000 | ORAL_TABLET | Freq: Two times a day (BID) | ORAL | 0 refills | Status: DC

## 2016-01-28 MED ORDER — BENZONATATE 100 MG PO CAPS: 200.0000 mg | ORAL_CAPSULE | Freq: Three times a day (TID) | ORAL | 1 refills | Status: DC | PRN

## 2016-01-28 MED ORDER — PREDNISONE 20 MG PO TABS: 60.0000 mg | ORAL_TABLET | Freq: Every day | ORAL | 0 refills | Status: AC

## 2016-01-28 NOTE — Progress Notes (Signed)
   Subjective:    Patient ID: [name], female    DOB: November 20, 1923, 82 y.o.   MRN: [id number] JACQUETTA POLHAMUS is accompanied by daughter, Jacobo Forest Sources of clinical information for visit is/are patient, relative(s) and past medical records. I viewed CXR (2V) from 01/27/16.  I visited patient at her home for this assessment  HPI  Shortness of Breath - Onset 01/21/16.  Pt had been on cruise.  - Cough with increase sputum and occasional yellowish sputum.  - DOE (+), (+) wheezing, nocturnal cough kept her awake.  Slept in chair.  - No fever.  No chest pain. No leg swelling.  - Pt on Apixaban for Afib. - Pt took Azithromycin and prednisone 40 mg daily for 5 days each without significant improvement. Last doses of each were 11/29 - Staying with [name], [name].   - PMH Stage C COPD on Spiriva/Advair and prn albuterol that she has been using four times a day.   SH: former smoker.   Review of Systems No adbominal pain No diarrhea     Objective:   Physical Exam Vitals:   01/27/16 0852  BP: 140/80  Pulse: 76  Temp: 98 F (36.7 C)  SaO2 94% on RA  VS reviewed GEN: Alert, Cooperative, Groomed, audible expiratory wheeze HEENT: PERRL;                 No cervical LAN, No thyromegaly, No palpable masses COR: RRR, No gallup LUNGS: Bilateral polyphonic expiratory wheezes, no crackles, no acc mm use.  Ext: no c/c/e, no asymmetry of calf circumference. Psych: Normal affect/thought/speech/language   CXR (2V); no infiltrate, no evidence pulmonary edema Assessment & Plan:  1. Acute exacerbation of COPD - No high risk features of impending decompensation - Rx Augmentin 875 BID x 7 days - Rx Prednisone 60 mg daily for 7 days - Increase Lasix to 40 mg daily for next 3 days.  - Lab visit in next 2-3 days for BNP and BMET - Rx Tessalon Pearls 200 mg TID prn cough - Red flags reviewed with patient and her dgt, [name]: change in mentation, increase weakness, worsening SOB

## 2016-01-29 ENCOUNTER — Other Ambulatory Visit: Payer: Medicare Other

## 2016-01-29 DIAGNOSIS — Z79899 Other long term (current) drug therapy: Secondary | ICD-10-CM | POA: Diagnosis not present

## 2016-01-29 DIAGNOSIS — R0602 Shortness of breath: Secondary | ICD-10-CM | POA: Diagnosis not present

## 2016-01-29 LAB — BASIC METABOLIC PANEL WITH GFR
BUN: 41 mg/dL — ABNORMAL HIGH (ref 7–25)
CHLORIDE: 98 mmol/L (ref 98–110)
CO2: 28 mmol/L (ref 20–31)
Calcium: 9.3 mg/dL (ref 8.6–10.4)
Creat: 1.98 mg/dL — ABNORMAL HIGH (ref 0.60–0.88)
GFR, EST NON AFRICAN AMERICAN: 22 mL/min — AB (ref >=60)
GFR, Est African American: 25 mL/min — ABNORMAL LOW (ref >=60)
GLUCOSE: 129 mg/dL — AB (ref 65–99)
Potassium: 4.5 mmol/L (ref 3.5–5.3)
SODIUM: 138 mmol/L (ref 135–146)

## 2016-01-30 ENCOUNTER — Telehealth: Payer: Self-pay | Admitting: Family Medicine

## 2016-01-30 LAB — BRAIN NATRIURETIC PEPTIDE: Brain Natriuretic Peptide: 550.2 pg/mL — ABNORMAL HIGH (ref <100)

## 2016-01-30 NOTE — Telephone Encounter (Signed)
Left message asking Ms Callas to stop the lasix for now.

## 2016-02-01 NOTE — Telephone Encounter (Signed)
No answer. Left message.  Reminded pt to stop lasix.

## 2016-02-03 ENCOUNTER — Other Ambulatory Visit: Payer: Self-pay | Admitting: Family Medicine

## 2016-02-03 DIAGNOSIS — H353222 Exudative age-related macular degeneration, left eye, with inactive choroidal neovascularization: Secondary | ICD-10-CM | POA: Diagnosis not present

## 2016-02-03 DIAGNOSIS — H353212 Exudative age-related macular degeneration, right eye, with inactive choroidal neovascularization: Secondary | ICD-10-CM | POA: Diagnosis not present

## 2016-02-04 ENCOUNTER — Encounter: Payer: Self-pay | Admitting: Speech Pathology

## 2016-02-04 ENCOUNTER — Ambulatory Visit (INDEPENDENT_AMBULATORY_CARE_PROVIDER_SITE_OTHER): Payer: Medicare Other | Admitting: Family Medicine

## 2016-02-04 ENCOUNTER — Encounter: Payer: Self-pay | Admitting: Family Medicine

## 2016-02-04 VITALS — BP 137/80 | HR 88 | Temp 98.3°F | Wt 160.0 lb

## 2016-02-04 DIAGNOSIS — J449 Chronic obstructive pulmonary disease, unspecified: Secondary | ICD-10-CM

## 2016-02-04 DIAGNOSIS — N184 Chronic kidney disease, stage 4 (severe): Secondary | ICD-10-CM

## 2016-02-04 DIAGNOSIS — J302 Other seasonal allergic rhinitis: Secondary | ICD-10-CM | POA: Diagnosis not present

## 2016-02-04 DIAGNOSIS — I25119 Atherosclerotic heart disease of native coronary artery with unspecified angina pectoris: Secondary | ICD-10-CM

## 2016-02-04 DIAGNOSIS — G250 Essential tremor: Secondary | ICD-10-CM

## 2016-02-04 HISTORY — DX: Essential tremor: G25.0

## 2016-02-04 LAB — BASIC METABOLIC PANEL WITH GFR
BUN: 52 mg/dL — ABNORMAL HIGH (ref 7–25)
CHLORIDE: 103 mmol/L (ref 98–110)
CO2: 25 mmol/L (ref 20–31)
Calcium: 9.2 mg/dL (ref 8.6–10.4)
Creat: 1.99 mg/dL — ABNORMAL HIGH (ref 0.60–0.88)
GFR, EST AFRICAN AMERICAN: 25 mL/min — AB (ref >=60)
GFR, EST NON AFRICAN AMERICAN: 22 mL/min — AB (ref >=60)
Glucose, Bld: 102 mg/dL — ABNORMAL HIGH (ref 65–99)
POTASSIUM: 5.3 mmol/L (ref 3.5–5.3)
Sodium: 139 mmol/L (ref 135–146)

## 2016-02-04 MED ORDER — TIOTROPIUM BROMIDE MONOHYDRATE 18 MCG IN CAPS
18.0000 ug | ORAL_CAPSULE | Freq: Every day | RESPIRATORY_TRACT | 99 refills | Status: DC
Start: 2016-02-04 — End: 2016-03-10

## 2016-02-04 NOTE — Assessment & Plan Note (Signed)
New problem Not interfering with activities other than changing her handwriting which she used to take pride in Discourage medication treatment as already on metoprolol and she isnot a good candidate for primidone.

## 2016-02-04 NOTE — Assessment & Plan Note (Signed)
Established problem worsened.  Increase Cr last week with increase in lasix from three times a week to daily 40 mg. Stopped Lasix on 12/2. Recheck BMET today.

## 2016-02-04 NOTE — Patient Instructions (Addendum)
I believe you have an essential tremor. Medications that treat it can cause problems for you.   Dr McDiarmid will call you if your tests are not good. Otherwise he will send you a letter.  If you sign up for MyChart online, you will be able to see your test results once Dr McDiarmid has reviewed them.  If you do not hear from [place] with in 2 weeks please call our office  Loratadine would be a safe allergy medication that you can use.   Start to decrease how frequently you take the albuterol as you feel comfortable.

## 2016-02-04 NOTE — Assessment & Plan Note (Signed)
New problem No further work up [employer] OTC Loratadine 10 mg daily prn allergy symptoms

## 2016-02-04 NOTE — Assessment & Plan Note (Signed)
Established problem that has improved.  Recovering from AECOPD last week. Continue Advair/Spiriva and prn Albuterol.  Decrease albuterol from QID to TID then prn CAT score 28 likely elevated by recent AE-COPD

## 2016-02-04 NOTE — Therapy (Signed)
[facility] [address] [place], [place], [address] Phone: [phone]   Fax:  [phone]  Patient Details  Name: [name] MRN: [id number] Date of Birth: 1923-04-19 Referring Provider:  No ref. provider found  Encounter Date: 02/04/2016   SPEECH THERAPY DISCHARGE SUMMARY  Visits from Start of Care: 1 Current functional level related to goals / functional outcomes:  Goals not met - pt did not return to ST after initial evaluation   Remaining deficits: cognition   Education / Equipment: Compensations for cognitive impairments, goals for ST Plan: Patient agrees to discharge.  Patient goals were not met. Patient is being discharged due to not returning since the last visit.  ?????         [name], [name] MS, CCC-SLP 02/04/2016, 12:01 PM  [facility] [address] [place], [place], [address] Phone: [phone]   Fax:  [phone]

## 2016-02-04 NOTE — Progress Notes (Signed)
   Subjective:    Patient ID: [name], female    DOB: [birth year], 82 y.o.   MRN: [id number] [name] is alone Sources of clinical information for visit is/are patient, past medical records and notes sent by patient's dgt. Nursing assessment for this office visit was reviewed with the patient for accuracy and revision.   HPI  COPD - Longstanding - Exacerbation last week treated with two rounds of prednsione and tow rounds of antibiotics (Z-pak, Augmentin) - Pt able to participate in her 89th BDay party despite COPD attack.  - Slowly improving - Finished Augmentin. Today was last dose of Prednisone - Not taking Lasix as instructed by phone on 01/30/16 by [name] - CAT score 28 - taking Advair/Spiriva and albuterol qid scheduled (scheduled begun with last week from prn use) - Increased cough and purulent sputum persist but ability to ambulate has improved since last week.   Tremor - Onset in last couple months - noticible when writing. Scripts displays tremor in lines - No difficulty eating, ADLS - No falls, no cogwheeling  Allergic Rhinitis - recent increase in postnasal drip. - asking for tablet to treat  Chronic Kidney Disease - No confusion, no chest pain, no nausea/vomiting - Creating increased to 1.9 last week with increase of lasix to 40 mg for 3 days.  SH: no smoking  Review of Systems See hpi No fever No diarrhea    Objective:   Physical Exam BP 137/80   Pulse 88   Temp 98.3 F (36.8 C) (Oral)   Wt 160 lb (72.6 kg)   SpO2 98% Comment: 92% walking using room air  BMI 27.04 kg/m  VS reviewed GEN: Alert, Cooperative, Groomed, NAD COR: RRR, 2/6 Murmur upper sternal border without radiation LUNGS: BCTA, No Acc mm use, speaking in full sentences EXT: No peripheral leg edema.SKIN: No lesion nor rashes of face/trunk/extremities Neuro: Oriented to person, place, and time; Gait: Normal speed, No significant path deviation, Step through +,  No tremor at  rest,(+) intentional tremor, normal finger-to-nose bilaterally Psych: Normal affect/thought/speech/language        Assessment & Plan:

## 2016-02-05 ENCOUNTER — Encounter: Payer: Self-pay | Admitting: Family Medicine

## 2016-02-11 ENCOUNTER — Ambulatory Visit: Payer: Medicare Other | Admitting: Emergency Medicine

## 2016-02-11 ENCOUNTER — Encounter: Payer: Self-pay | Admitting: Emergency Medicine

## 2016-02-11 ENCOUNTER — Ambulatory Visit (INDEPENDENT_AMBULATORY_CARE_PROVIDER_SITE_OTHER): Payer: Medicare Other | Admitting: Emergency Medicine

## 2016-02-11 DIAGNOSIS — J302 Other seasonal allergic rhinitis: Secondary | ICD-10-CM | POA: Diagnosis not present

## 2016-02-11 DIAGNOSIS — J449 Chronic obstructive pulmonary disease, unspecified: Secondary | ICD-10-CM | POA: Diagnosis not present

## 2016-02-11 DIAGNOSIS — I25119 Atherosclerotic heart disease of native coronary artery with unspecified angina pectoris: Secondary | ICD-10-CM

## 2016-02-11 MED ORDER — FLUTICASONE PROPIONATE 50 MCG/ACT NA SUSP: 2.0000 | Freq: Every day | NASAL | 2 refills | Status: DC

## 2016-02-11 MED ORDER — FLUTICASONE-UMECLIDIN-VILANT 100-62.5-25 MCG/INH IN AEPB: 1.0000 | INHALATION_SPRAY | Freq: Every day | RESPIRATORY_TRACT | 0 refills | Status: DC

## 2016-02-11 MED ORDER — LORATADINE 10 MG PO TABS: 10.0000 mg | ORAL_TABLET | Freq: Every day | ORAL | 1 refills | Status: DC

## 2016-02-11 NOTE — Assessment & Plan Note (Signed)
Start loratadine and fluticasone - nasal congestion contributing to chest congestion.

## 2016-02-11 NOTE — Assessment & Plan Note (Signed)
We will try stopping Spiriva and Advair for now.  Start Trelegy one inhalation once a day for the next 6 weeks to see if you benefit.  Keep your albuterol inhaler and your nebulizer available to use as needed for shortness of breath.  Follow with Dr Lamonte Sakai in 6 weeks or sooner if you have any problems

## 2016-02-11 NOTE — Patient Instructions (Addendum)
We will try stopping Spiriva and Advair for now.  Start Trelegy one inhalation once a day for the next 6 weeks to see if you benefit.  Keep your albuterol inhaler and your nebulizer available to use as needed for shortness of breath.  Start fluticasone nasal spray 2 sprays each nostril daily until next visit  Start loratadine 10mg  daily until our next visit.  Follow with Dr Lamonte Sakai in 6 weeks or sooner if you have any problems

## 2016-02-11 NOTE — Addendum Note (Signed)
Addended by: Benson Setting L on: 02/11/2016 12:49 PM   Modules accepted: Orders

## 2016-02-11 NOTE — Addendum Note (Signed)
Addended by: Benson Setting L on: 02/11/2016 10:54 AM   Modules accepted: Orders

## 2016-02-11 NOTE — Progress Notes (Signed)
GERD  Subjective:    Patient ID: [name], female    DOB: 11/12/1923, 82 y.o.   MRN: [id number]  HPI 82 yo former smoker with a history of Horner disease, sick sinus syndrome, hypertension, chronic kidney disease, GERD with a hiatal hernia. She also has significant COPD for which she has been followed by Dr. Casper Harrison at [facility]. She was readmitted with A fib and RVR, associated with CP and dyspnea.   She has been managed on Spiriva and Advair. Has been treated with O2 at times in the past but is not on it currently She has not needed albuterol with any regularity.  She flares about once every 2 years. She has minimal coughing. She rarely wheezes. She uses lasix prn for LE edema.   She had Spirometry 2014 at [facility] >> FEV1 1.02L (60% pred).   ROV 07/14/15 -- patient has a history of CAD, hypertension, atrial fibrillation with diastolic CHF, COPD with an FEV1 of 1.02 L in 2014. Last seen in cardiology on 5/10 at which time she had been experiencing some orthostasis and hypotension. Her metoprolol was decreased from 50 mg down to 25 mg. She remains on Spiriva and Advair, uses albuterol when necessary. She was hospitalized x 2, once after a fall while on a cruise in mid-April. She was found to be hypoxemic, was told to restart her O2.   ROV 11/03/15 -- This is a follow-up visit for history of severe COPD with severe obstruction by spirometry. She also has a history of coronary disease, hypertension, age fibrillation and diastolic CHF as outlined above. She is only using lasix prn at this time, beginning about 3 weeks ago. She is having more trouble with mucous and some drainage. She was treated with azithro 8/31. Continues to have some associated dyspnea. She is now off lorazepam. She is on Advair and Spiriva.   Her daughter indicates that she snores loudly. She had a PSG remotely, no OSA.   ROV 02/11/16 -- This is a follow-up visit for history of diastolic CHF, atrial fibrillation, hypertension as well as  significant obstructive lung disease and COPD. Since our last visit she has been treated an acute exacerbation with pred + abx. She has rebounded and not quite back to baseline. Despite her limiting problems she has remains fairly active - She was able to go on a cruise to [place] in November. She has been doing a lot of coughing. She has not been able to get back home to live on her own - has been with her daughter. She has been on mucinex-DM. Also using Advair, Spiriva. Her neb use has decreased now to about 2x a day. She is having a lot of nasal congestion and mucous. Her energy level is decreased, not at baseline either. Her wt has been a little up and down, last week 160, today 153. No ankle swelling lately.      Review of Systems  Constitutional: Negative for fever and unexpected weight change.  HENT: Positive for congestion. Negative for dental problem, ear pain, nosebleeds, postnasal drip, rhinorrhea, sinus pressure, sneezing, sore throat and trouble swallowing.   Eyes: Negative for redness and itching.  Respiratory: Positive for shortness of breath. Negative for cough, chest tightness and wheezing.   Cardiovascular: Positive for palpitations. Negative for chest pain and leg swelling.  Gastrointestinal: Negative for nausea and vomiting.  Genitourinary: Negative for dysuria.  Musculoskeletal: Negative for joint swelling.  Skin: Negative for rash.  Neurological: Negative for headaches.  Hematological:  Does not bruise/bleed easily.  Psychiatric/Behavioral: Negative for dysphoric mood. The patient is nervous/anxious.     Past Medical History:  Diagnosis Date  . Abnormal mammogram, unspecified 08/23/2010   Followup imaging reassuring.   . Acute appendicitis with rupture   . ADENOMATOUS COLONIC POLYP 03/01/2003   Qualifier: Diagnosis of  By: McDiarmid MD, Sherren Mocha Multiple benign polyps of cecum, ascending, transverse and sigmoid colon by 8/09 colonoscopy by Dr Cristina Gong  Colonoscopy by Dr Cristina Gong  for iron-deficiency anemia on 04/27/2010 showed three sessile polyps that were in ascending (3 mm x 9 mm), transverse (4 mm), and cecum (3 mm).  All three were tubular adenomas that were negative for high grade dysplasia or malignancy on pathology. Dr Cristina Gong called the polpys benign and not requiring follow-up in view of the patients age.    . Adrenal adenoma    Incidentaloma  . AF (paroxysmal atrial fibrillation) ([place]) 11/11/2010   Hospitalization (9/8-9/10, Dr Daneen Schick, III, Cardiology) for Paroxysmal Atrial Fibrillation with RVR and anginal pain secondary to demand/supply mismatch in setting of RVR with known circumflex artery branch disease.    Marland Kitchen ANXIETY 04/27/2006   Qualifier: Diagnosis of  By: McDiarmid MD, Sherren Mocha    . Candidiasis of the esophagus 11/26/2007  . CAP (community acquired pneumonia) 02/25/2015  . Cataract 2013   Bilateral   . Cellulitis of leg, right 10/01/2012  . Cellulitis of right lower extremity   . Chest pain with moderate risk of acute coronary syndrome 05/21/2015  . Chronic kidney disease (CKD), stage III (moderate)   . Concussion with loss of consciousness   . COPD 04/27/2006   Qualifier: Diagnosis of  By: McDiarmid MD, Sherren Mocha    . COPD exacerbation ([place])   . COPD, severe   . Decreased functional mobility and endurance 03/17/2015  . Diastolic heart failure ([place]) 07/30/2015  . DISC WITH RADICULOPATHY 04/27/2006   Qualifier: Diagnosis of  By: McDiarmid MD, Sherren Mocha    . EDEMA-LEGS,DUE TO VENOUS OBSTRUCT. 04/27/2006   Qualifier: Diagnosis of  By: McDiarmid MD, Sherren Mocha    . 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 Normal uric acid serum level at time of attack.    Marland Kitchen HERNIA, HIATAL, NONCONGENITAL 04/27/2006   Qualifier: Diagnosis of  By: McDiarmid MD, Sherren Mocha    . High risk medications (not anticoagulants) long-term use 03/05/2012  . History of Hemorrhoids 04/27/2006   Qualifier: Diagnosis of  By: McDiarmid MD,  Sherren Mocha    . History of iron deficiency 01/16/2015  . Hx of colonoscopy with polypectomy 04/27/2010   Dr Cristina Gong found three  tubular adenomas each less than 10 mm size  . Hypertension   . Hypotension 07/09/2015  . Iron deficiency anemia 08/05/2010   Dr Cristina Gong (GI) has evaluated with EGD, colonoscopy, and video capsular endoscopy in 2011 & 2012.  All have been unrevealing as to an origin of IDA.  OV with Dr Cristina Gong (10/28/10) assessment of blood in stool per hemoccult and GER. Hbg 12.1 g/dL, MCV 91.8, Ferritin 30 ng/mL. Patient taking on ferrous sulfate tab daily.   EGD on 03/06/12 by Dr Cristina Gong for IDA non-obstructing Schatzki's ring at Gastroesophageal junction, otherwise normal esophagus and stomach.     . Leg cramps 05/29/2012  . Lumbar herniated disc    History of HNP L4/5 in 2003  . Macular degeneration, bilateral 10/04/2010   Right eye is wet MD, the other is dry macular degeneration (  ARMD). Pt undergoing some form of vascular endothelial growth factor inhibition intraocular therapy.    . Mild cognitive impairment 10/26/2012   (10/25/12) Failed MiniCog screen  . MUSCLE CRAMPS 03/11/2010   Qualifier: Diagnosis of  By: McDiarmid MD, Sherren Mocha    . Muscle spasm of back 09/24/2013  . Myocardial infarct, old   . Numbness and tingling in hands 07/21/2011  . Pacemaker    MDT  . PREDIABETES 09/11/2007   Qualifier: Diagnosis of  By: McDiarmid MD, Sherren Mocha    . Retinal hemorrhage of left eye 06/2010  . RHINITIS, ALLERGIC 04/27/2006   Qualifier: Diagnosis of  By: McDiarmid MD, Sherren Mocha    . SCHATZKI'S RING, HX OF 11/26/2007   Qualifier: Diagnosis of  By: McDiarmid MD, Sherren Mocha  An EGD was performed by Dr Cristina Gong on 04/27/2010 for iron deficiency anemia. There was a a transient hiatal hernia with Schatzki's ring. Stomach and duodenum were normal. EGD on 03/06/12 by Dr Cristina Gong for IDA non-obstructing Schatzki's ring at Gastroesophageal junction, otherwise normal esophagus and stomach.    . Seborrheic keratosis, right anterior thigh  12/12/2013  . Shoulder pain, left 01/09/2014  . Sick sinus syndrome with tachycardia ([place])    MDT  . Soft tissue injury of foot 05/03/2011  . Solar lentigo 06/15/2012  . Solitary kidney, acquired 05/17/2010   Surgical removal for transitional cell cancer by Tresa Endo, MD ([facility]). Surveillance cystoscopy by Dr Alinda Money [facility]) on 10/19/12 without evidence of cystoscopic recurrence. Recommend RTC one year for cystoscopy.   Marland Kitchen Spinal stenosis, lumbar   . Transitional cell carcinoma of ureter, history   . Urge incontinence 12/13/2011   Diagnosed in 10/2011 by Dr Bjorn Loser (Urology)   . VENTRICULAR HYPERTROPHY, LEFT 08/28/2008   Qualifier: Diagnosis of  By: McDiarmid MD, Sherren Mocha    . VITAMIN B12 DEFICIENCY 10/07/2009   Qualifier: Diagnosis of  By: McDiarmid MD, Sherren Mocha  Dx based on a post-TKR anemia work-up Low normal serum B12 with high Methylmalonic acid and homocysteine level  Vit B12 serum level (10/28/10) > 1500 pg/mL   . Vitamin D deficiency 11/02/2010   Serum vitamin D 25(OH) = 10.9 ng/mL (30 -100) on 10/28/10 c/w Vitamin D deficiency.        Family History  Problem Relation Age of Onset  . Dementia Mother   . Heart disease Father      Social History   Social History  . Marital status: Widowed    Spouse name: N/A  . Number of children: 4  . Years of education: N/A   Occupational History  . Futures trader Retired    Retired   Social History Main Topics  . Smoking status: Former Smoker    Packs/day: 1.00    Types: Cigarettes    Quit date: 03/02/2009  . Smokeless tobacco: Never Used  . Alcohol use 12.0 oz/week    14 Glasses of wine, 6 Standard drinks or equivalent per week     Comment: 4-5 glasses of wine per week  . Drug use: No  . Sexual activity: No   Other Topics Concern  . 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    Semi-retired Futures trader.     Pt has home nebulizer for inhalation therapies.   Former Smoker  Smoking Status:  quit > 5 years ago      (+) DNR status per discussion with Dr McDiarmid 10/25/12 and reiterated 06/20/13 office visit .   (+) Living Will/Advance Directive   (+) HC-POA: Cecile Sheerer Causey (pt's dgt)     Allergies  Allergen Reactions  . Baclofen Other (See Comments)    Confusion occurred with taking 3 at the same time.  . Codeine Phosphate Nausea Only  . Norco [Hydrocodone-Acetaminophen] Nausea And Vomiting  . Hydrochlorothiazide Other (See Comments)    Possible acute gout or arthritis of wrist  . Montelukast Sodium Other (See Comments)    Unspecified reaction.      Outpatient Medications Prior to Visit  Medication Sig Dispense Refill  . albuterol (PROVENTIL) (2.5 MG/3ML) 0.083% nebulizer solution USE 1 VIAL VIA NEBULIZER EVERY 6 HOURS AS NEEDED FOR WHEEZING OR SHORTNESS OF BREATH 1350 mL 0  . amiodarone (PACERONE) 200 MG tablet Take 1 tablet (200 mg total) by mouth daily. 90 tablet 3  . apixaban (ELIQUIS) 2.5 MG TABS tablet Take 1 tablet (2.5 mg total) by mouth 2 (two) times daily. 60 tablet 5  . atorvastatin (LIPITOR) 20 MG tablet Take 1 tablet (20 mg total) by mouth daily. 30 tablet 11  . benzonatate (TESSALON) 100 MG capsule Take 2 capsules (200 mg total) by mouth 3 (three) times daily as needed for cough. 28 capsule 1  . calcium carbonate (OSCAL) 1500 (600 Ca) MG TABS tablet Take 600 mg of elemental calcium by mouth 2 (two) times daily with a meal.    . Cholecalciferol 10000 units TABS Take by mouth.    . dextromethorphan-guaiFENesin (MUCINEX DM) 30-600 MG 12hr tablet Take 1 tablet by mouth 2 (two) times daily as needed for cough. 60 tablet 0  . docusate sodium (COLACE) 100 MG capsule Take 100 mg by mouth daily as needed for mild constipation.    . Fluticasone-Salmeterol (ADVAIR DISKUS) 250-50 MCG/DOSE AEPB Inhale 2 puffs into  the lungs 2 (two) times daily. 60 each 11  . furosemide (LASIX) 40 MG tablet TAKE 1 TABLET BY MOUTH EVERY MORNING 30 tablet 0  . Melatonin 3 MG TABS Take by mouth.    . metoprolol succinate (TOPROL-XL) 25 MG 24 hr tablet Take 1 tablet (25 mg total) by mouth daily. 90 tablet 3  . nitroGLYCERIN (NITROSTAT) 0.4 MG SL tablet Place 1 tablet (0.4 mg total) under the tongue every 5 (five) minutes as needed for chest pain (x 3 tabs). 25 tablet PRN  . omeprazole (PRILOSEC) 20 MG capsule TAKE 1 CAPSULE(20 MG) BY MOUTH DAILY 90 capsule 0  . Sennosides (LAXATIVE) 25 MG TABS Take by mouth daily as needed.    . tiotropium (SPIRIVA HANDIHALER) 18 MCG inhalation capsule Place 1 capsule (18 mcg total) into inhaler and inhale daily. 30 capsule PRN  . traMADol (ULTRAM) 50 MG tablet Take 50 mg by mouth every 6 (six) hours as needed.    . VENTOLIN HFA 108 (90 Base) MCG/ACT inhaler INHALE 2 PUFFS WITH SPACER EVERY 4 HOURS AS NEEDED FOR WHEEZING 18 g 5  . vitamin B-12 (CYANOCOBALAMIN) 1000 MCG tablet Take 1,000 mcg by mouth daily.     No facility-administered medications prior to visit.          Objective:   Physical Exam  Vitals:   02/11/16 0952  BP: 122/78  Pulse: 81  SpO2: 98%  Weight: 155 lb (70.3 kg)  Height: 5' 4.5" (1.638 m)   Gen: Pleasant, elderly woman in  no distress,  normal affect  ENT: No lesions,  mouth clear,  oropharynx clear, no postnasal drip, hoarse voice.   Neck: No JVD, no stridor  Lungs: No use of accessory muscles, somewhat distant, coarse bilaterally, some UA noise  Cardiovascular: Irregular, heart sounds normal, no murmur or gallops, no peripheral edema  Musculoskeletal: No deformities, no cyanosis or clubbing  Neuro: alert, non focal  Skin: Warm, no lesions or rashes      Assessment & Plan:  Allergic rhinitis, seasonal Start loratadine and fluticasone - nasal congestion contributing to chest congestion.   COPD, severe ([place]) We will try stopping Spiriva and  Advair for now.  Start Trelegy one inhalation once a day for the next 6 weeks to see if you benefit.  Keep your albuterol inhaler and your nebulizer available to use as needed for shortness of breath.  Follow with Dr Lamonte Sakai in 6 weeks or sooner if you have any problems  Baltazar Apo, MD, PhD 02/11/2016, 10:07 AM [facility] [phone] or if no answer [phone]

## 2016-02-23 ENCOUNTER — Other Ambulatory Visit: Payer: Self-pay | Admitting: Family Medicine

## 2016-02-23 DIAGNOSIS — M545 Low back pain: Secondary | ICD-10-CM | POA: Diagnosis not present

## 2016-02-23 DIAGNOSIS — M48061 Spinal stenosis, lumbar region without neurogenic claudication: Secondary | ICD-10-CM | POA: Diagnosis not present

## 2016-02-23 DIAGNOSIS — K219 Gastro-esophageal reflux disease without esophagitis: Secondary | ICD-10-CM

## 2016-02-29 ENCOUNTER — Other Ambulatory Visit: Payer: Self-pay | Admitting: Internal Medicine

## 2016-03-10 ENCOUNTER — Ambulatory Visit (INDEPENDENT_AMBULATORY_CARE_PROVIDER_SITE_OTHER): Payer: Medicare Other | Admitting: Interventional Cardiology

## 2016-03-10 ENCOUNTER — Encounter: Payer: Self-pay | Admitting: Interventional Cardiology

## 2016-03-10 VITALS — BP 136/88 | HR 99 | Ht 65.0 in | Wt 160.0 lb

## 2016-03-10 DIAGNOSIS — I495 Sick sinus syndrome: Secondary | ICD-10-CM

## 2016-03-10 DIAGNOSIS — Z79899 Other long term (current) drug therapy: Secondary | ICD-10-CM

## 2016-03-10 DIAGNOSIS — I5032 Chronic diastolic (congestive) heart failure: Secondary | ICD-10-CM

## 2016-03-10 DIAGNOSIS — J449 Chronic obstructive pulmonary disease, unspecified: Secondary | ICD-10-CM

## 2016-03-10 DIAGNOSIS — Z9861 Coronary angioplasty status: Secondary | ICD-10-CM | POA: Diagnosis not present

## 2016-03-10 DIAGNOSIS — I480 Paroxysmal atrial fibrillation: Secondary | ICD-10-CM | POA: Diagnosis not present

## 2016-03-10 DIAGNOSIS — I2510 Atherosclerotic heart disease of native coronary artery without angina pectoris: Secondary | ICD-10-CM

## 2016-03-10 DIAGNOSIS — Z7901 Long term (current) use of anticoagulants: Secondary | ICD-10-CM

## 2016-03-10 DIAGNOSIS — E7800 Pure hypercholesterolemia, unspecified: Secondary | ICD-10-CM | POA: Diagnosis not present

## 2016-03-10 NOTE — Progress Notes (Signed)
Cardiology Office Note    Date:  03/10/2016   ID:  [name], DOB 1923/12/20, MRN [id number]  PCP:  Acquanetta Sit, MD  Cardiologist: Sinclair Grooms, MD   Chief Complaint  Patient presents with  . Atrial Fibrillation    History of Present Illness:  [name] is a 83 y.o. female with previous coronary stent, paroxysmal atrial fibrillation, amiodarone therapy, chronic anticoagulation therapy, chronic diastolic heart failure, hypertension, and pacemaker for tachybradycardia syndrome.   Doing well. Lungs have improved after the addition of Trelegy by Dr. Lamonte Sakai. She denies palpitations. No sudden episodes of weakness. She denies angina.  Past Medical History:  Diagnosis Date  . Abnormal mammogram, unspecified 08/23/2010   Followup imaging reassuring.   . Acute appendicitis with rupture   . ADENOMATOUS COLONIC POLYP 03/01/2003   Qualifier: Diagnosis of  By: McDiarmid MD, Sherren Mocha Multiple benign polyps of cecum, ascending, transverse and sigmoid colon by 8/09 colonoscopy by Dr Cristina Gong  Colonoscopy by Dr Cristina Gong for iron-deficiency anemia on 04/27/2010 showed three sessile polyps that were in ascending (3 mm x 9 mm), transverse (4 mm), and cecum (3 mm).  All three were tubular adenomas that were negative for high grade dysplasia or malignancy on pathology. Dr Cristina Gong called the polpys benign and not requiring follow-up in view of the patients age.    . Adrenal adenoma    Incidentaloma  . AF (paroxysmal atrial fibrillation) ([place]) 11/11/2010   Hospitalization (9/8-9/10, Dr Daneen Schick, III, Cardiology) for Paroxysmal Atrial Fibrillation with RVR and anginal pain secondary to demand/supply mismatch in setting of RVR with known circumflex artery branch disease.    Marland Kitchen ANXIETY 04/27/2006   Qualifier: Diagnosis of  By: McDiarmid MD, Sherren Mocha    . Candidiasis of the esophagus 11/26/2007  . CAP (community acquired pneumonia) 02/25/2015  . Cataract 2013   Bilateral   . Cellulitis of leg, right  10/01/2012  . Cellulitis of right lower extremity   . Chest pain with moderate risk of acute coronary syndrome 05/21/2015  . Chronic kidney disease (CKD), stage III (moderate)   . Concussion with loss of consciousness   . COPD 04/27/2006   Qualifier: Diagnosis of  By: McDiarmid MD, Sherren Mocha    . COPD exacerbation ([place])   . COPD, severe   . Decreased functional mobility and endurance 03/17/2015  . Diastolic heart failure ([place]) 07/30/2015  . DISC WITH RADICULOPATHY 04/27/2006   Qualifier: Diagnosis of  By: McDiarmid MD, Sherren Mocha    . EDEMA-LEGS,DUE TO VENOUS OBSTRUCT. 04/27/2006   Qualifier: Diagnosis of  By: McDiarmid MD, Sherren Mocha    . 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 Normal uric acid serum level at time of attack.    Marland Kitchen HERNIA, HIATAL, NONCONGENITAL 04/27/2006   Qualifier: Diagnosis of  By: McDiarmid MD, Sherren Mocha    . High risk medications (not anticoagulants) long-term use 03/05/2012  . History of Hemorrhoids 04/27/2006   Qualifier: Diagnosis of  By: McDiarmid MD, Sherren Mocha    . History of iron deficiency 01/16/2015  . Hx of colonoscopy with polypectomy 04/27/2010   Dr Cristina Gong found three  tubular adenomas each less than 10 mm size  . Hypertension   . Hypotension 07/09/2015  . Iron deficiency anemia 08/05/2010   Dr Cristina Gong (GI) has evaluated with EGD, colonoscopy, and video capsular endoscopy in 2011 & 2012.  All have been unrevealing as to an origin of IDA.  OV with Dr Cristina Gong (10/28/10) assessment of blood in stool per hemoccult and GER. Hbg 12.1 g/dL, MCV 91.8, Ferritin 30 ng/mL. Patient taking on ferrous sulfate tab daily.   EGD on 03/06/12 by Dr Cristina Gong for IDA non-obstructing Schatzki's ring at Gastroesophageal junction, otherwise normal esophagus and stomach.     . Leg cramps 05/29/2012  . Lumbar herniated disc    History of HNP L4/5 in 2003  . Macular degeneration, bilateral 10/04/2010   Right eye is wet MD, the other is dry macular  degeneration (ARMD). Pt undergoing some form of vascular endothelial growth factor inhibition intraocular therapy.    . Mild cognitive impairment 10/26/2012   (10/25/12) Failed MiniCog screen  . MUSCLE CRAMPS 03/11/2010   Qualifier: Diagnosis of  By: McDiarmid MD, Sherren Mocha    . Muscle spasm of back 09/24/2013  . Myocardial infarct, old   . Numbness and tingling in hands 07/21/2011  . Pacemaker    MDT  . PREDIABETES 09/11/2007   Qualifier: Diagnosis of  By: McDiarmid MD, Sherren Mocha    . Retinal hemorrhage of left eye 06/2010  . RHINITIS, ALLERGIC 04/27/2006   Qualifier: Diagnosis of  By: McDiarmid MD, Sherren Mocha    . SCHATZKI'S RING, HX OF 11/26/2007   Qualifier: Diagnosis of  By: McDiarmid MD, Sherren Mocha  An EGD was performed by Dr Cristina Gong on 04/27/2010 for iron deficiency anemia. There was a a transient hiatal hernia with Schatzki's ring. Stomach and duodenum were normal. EGD on 03/06/12 by Dr Cristina Gong for IDA non-obstructing Schatzki's ring at Gastroesophageal junction, otherwise normal esophagus and stomach.    . Seborrheic keratosis, right anterior thigh 12/12/2013  . Shoulder pain, left 01/09/2014  . Sick sinus syndrome with tachycardia ([place])    MDT  . Soft tissue injury of foot 05/03/2011  . Solar lentigo 06/15/2012  . Solitary kidney, acquired 05/17/2010   Surgical removal for transitional cell cancer by Tresa Endo, MD ([facility]). Surveillance cystoscopy by Dr Alinda [facility]) on 10/19/12 without evidence of cystoscopic recurrence. Recommend RTC one year for cystoscopy.   Marland Kitchen Spinal stenosis, lumbar   . Transitional cell carcinoma of ureter, history   . Urge incontinence 12/13/2011   Diagnosed in 10/2011 by Dr Bjorn Loser (Urology)   . VENTRICULAR HYPERTROPHY, LEFT 08/28/2008   Qualifier: Diagnosis of  By: McDiarmid MD, Sherren Mocha    . VITAMIN B12 DEFICIENCY 10/07/2009   Qualifier: Diagnosis of  By: McDiarmid MD, Sherren Mocha  Dx based on a post-TKR anemia work-up Low normal serum B12 with high Methylmalonic acid and  homocysteine level  Vit B12 serum level (10/28/10) > 1500 pg/mL   . Vitamin D deficiency 11/02/2010   Serum vitamin D 25(OH) = 10.9 ng/mL (30 -100) on 10/28/10 c/w Vitamin D deficiency.       Past Surgical History:  Procedure Laterality Date  . BREAST SURGERY     breast reduction  . CHOLECYSTECTOMY    . CORONARY ANGIOPLASTY WITH STENT PLACEMENT  2010   BMS RCA, OM2 occluded  . CYSTOSCOPY  09/30/2015   No cystoscopic evidence of uroepithelial neoplasm.  Follow up surveillance cystoscopy in one year  . DUPUYTREN CONTRACTURE RELEASE Right 05/22/2014   Procedure: DUPUYTREN RELEASE AND REPAIR AS NECESSARY RIGHT RING FINGER AND MIDDLE FINGER;  Surgeon: Roseanne Kaufman, MD;  Location: [facility];  Service: Orthopedics;  Laterality: Right;  . ESOPHAGOGASTRODUODENOSCOPY  04/27/2010   Dr Cristina Gong - found transient H/H & Schatzki's ring  . ESOPHAGOGASTRODUODENOSCOPY ENDOSCOPY  03/06/2012   Dr Cristina Gong - found non-obstuctive Schatzki's  ring at GE jnc. o/w normal EGD.   Marland Kitchen EYE SURGERY     bilateral cataracts  . KNEE ARTHROSCOPY W/ SYNOVECTOMY  11/2009, left knee   Dr Wynelle Link  . NEPHRECTOMY  For transition cell cancer    Dr Tresa Endo, surgeon  . PACEMAKER GENERATOR CHANGE N/A 11/12/2012   Procedure: PACEMAKER GENERATOR CHANGE;  Surgeon: Evans Lance, MD; [employer] dual-chamber pacemaker serial number [id number]; Laterality: Right  . PACEMAKER INSERTION  2005   Dr Doreatha Lew  . PACEMAKER LEAD REMOVAL  2005   Removal and reinsertion of atrial and ventricular leads due to migration  . REPLACEMENT TOTAL KNEE  2009, Right knee   Dr Wynelle Link  . REPLACEMENT TOTAL KNEE  05/2009, Left knee   Dr Wynelle Link  . TONSILLECTOMY    . TRIGGER FINGER RELEASE Right 05/22/2014   Procedure: RIGHT HAND A-1 PULLEY RELEASE ;  Surgeon: Roseanne Kaufman, MD;  Location: [facility];  Service: Orthopedics;  Laterality: Right;    Current Medications: Outpatient Medications Prior to Visit  Medication Sig Dispense Refill  . albuterol  (PROVENTIL) (2.5 MG/3ML) 0.083% nebulizer solution USE 1 VIAL VIA NEBULIZER EVERY 6 HOURS AS NEEDED FOR WHEEZING OR SHORTNESS OF BREATH 1350 mL 0  . amiodarone (PACERONE) 200 MG tablet TAKE 1 TABLET(200 MG) BY MOUTH DAILY 90 tablet 3  . apixaban (ELIQUIS) 2.5 MG TABS tablet Take 1 tablet (2.5 mg total) by mouth 2 (two) times daily. 60 tablet 5  . atorvastatin (LIPITOR) 20 MG tablet Take 1 tablet (20 mg total) by mouth daily. 30 tablet 11  . calcium carbonate (OSCAL) 1500 (600 Ca) MG TABS tablet Take 600 mg of elemental calcium by mouth 2 (two) times daily with a meal.    . Cholecalciferol 10000 units TABS Take 1 tablet by mouth daily.     Marland Kitchen dextromethorphan-guaiFENesin (MUCINEX DM) 30-600 MG 12hr tablet Take 1 tablet by mouth 2 (two) times daily as needed for cough. 60 tablet 0  . fluticasone (FLONASE) 50 MCG/ACT nasal spray Place 2 sprays into both nostrils daily. 16 g 2  . metoprolol succinate (TOPROL-XL) 25 MG 24 hr tablet Take 1 tablet (25 mg total) by mouth daily. 90 tablet 3  . nitroGLYCERIN (NITROSTAT) 0.4 MG SL tablet Place 1 tablet (0.4 mg total) under the tongue every 5 (five) minutes as needed for chest pain (x 3 tabs). 25 tablet PRN  . omeprazole (PRILOSEC) 20 MG capsule TAKE 1 CAPSULE(20 MG) BY MOUTH DAILY 90 capsule 3  . traMADol (ULTRAM) 50 MG tablet TAKE 1 TABLET BY MOUTH EVERY 6 HOURS AS NEEDED FOR PAIN 30 tablet 5  . VENTOLIN HFA 108 (90 Base) MCG/ACT inhaler INHALE 2 PUFFS WITH SPACER EVERY 4 HOURS AS NEEDED FOR WHEEZING 18 g 5  . vitamin B-12 (CYANOCOBALAMIN) 1000 MCG tablet Take 1,000 mcg by mouth daily.    . benzonatate (TESSALON) 100 MG capsule Take 2 capsules (200 mg total) by mouth 3 (three) times daily as needed for cough. (Patient not taking: Reported on 03/10/2016) 28 capsule 1  . docusate sodium (COLACE) 100 MG capsule Take 100 mg by mouth daily as needed for mild constipation.    . Fluticasone-Salmeterol (ADVAIR DISKUS) 250-50 MCG/DOSE AEPB Inhale 2 puffs into the lungs 2  (two) times daily. (Patient not taking: Reported on 03/10/2016) 60 each 11  . Fluticasone-Umeclidin-Vilant (TRELEGY ELLIPTA) 100-62.5-25 MCG/INH AEPB Inhale 1 puff into the lungs daily. (Patient not taking: Reported on 03/10/2016) 42 each 0  . furosemide (LASIX) 40 MG  tablet TAKE 1 TABLET BY MOUTH EVERY MORNING (Patient not taking: Reported on 03/10/2016) 30 tablet 0  . loratadine (CLARITIN) 10 MG tablet Take 1 tablet (10 mg total) by mouth daily. (Patient not taking: Reported on 03/10/2016) 30 tablet 1  . Melatonin 3 MG TABS Take by mouth.    . Sennosides (LAXATIVE) 25 MG TABS Take by mouth daily as needed.    . tiotropium (SPIRIVA HANDIHALER) 18 MCG inhalation capsule Place 1 capsule (18 mcg total) into inhaler and inhale daily. (Patient not taking: Reported on 03/10/2016) 30 capsule PRN   No facility-administered medications prior to visit.      Allergies:   Baclofen; Codeine phosphate; Norco [hydrocodone-acetaminophen]; Hydrochlorothiazide; and Montelukast sodium   Social History   Social History  . Marital status: Widowed    Spouse name: N/A  . Number of children: 4  . Years of education: N/A   Occupational History  . Futures trader Retired    Retired   Social History Main Topics  . Smoking status: Former Smoker    Packs/day: 1.00    Types: Cigarettes    Quit date: 03/02/2009  . Smokeless tobacco: Never Used  . Alcohol use 12.0 oz/week    14 Glasses of wine, 6 Standard drinks or equivalent per week     Comment: 4-5 glasses of wine per week  . Drug use: No  . Sexual activity: No   Other Topics Concern  . 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    Semi-retired Futures trader.     Pt has home nebulizer for inhalation therapies.   Former Smoker   Smoking Status:  quit > 5 years ago      (+) DNR  status per discussion with Dr McDiarmid 10/25/12 and reiterated 06/20/13 office visit .   (+) Living Will/Advance Directive   (+) HC-POA: Cecile Sheerer Causey (pt's dgt)     Family History:  The patient's family history includes Dementia in her mother; Heart disease in her father.   ROS:   Please see the history of present illness.    Occasional lower extremity edema being managed with low-dose loop diuretic by Dr. Vikki Ports her primary care. Back discomfort. He's a bruising, snoring, and constipation.  All other systems reviewed and are negative.   PHYSICAL EXAM:   VS:  BP 136/88 (BP Location: Right Arm)   Pulse 99   Ht 5\' 5"  (1.651 m)   Wt 160 lb (72.6 kg)   BMI 26.63 kg/m    GEN: Well nourished, well developed, in no acute distress  HEENT: normal  Neck: no JVD, carotid bruits, or masses Cardiac: RRR; no murmurs, rubs, or gallops,no edema  Respiratory:  clear to auscultation bilaterally, normal work of breathing GI: soft, nontender, nondistended, + BS MS: no deformity or atrophy  Skin: warm and dry, no rash Neuro:  Alert and Oriented x 3, Strength and sensation are intact Psych: euthymic mood, full affect  Wt Readings from Last 3 Encounters:  03/10/16 160 lb (72.6 kg)  02/11/16 155 lb (70.3 kg)  02/04/16 160 lb (72.6 kg)      Studies/Labs Reviewed:   EKG:  EKG  Not repeated. Last tracing done in September 2017 revealed atrial pacing, interventricular conduction delay.  Recent Labs: 06/13/2015: ALT 15 01/07/2016:  Hemoglobin 10.6; Magnesium 2.0; Platelets 250 01/29/2016: Brain Natriuretic Peptide 550.2 02/04/2016: BUN 52; Creat 1.99; Potassium 5.3; Sodium 139   Lipid Panel    Component Value Date/Time   CHOL 140 07/30/2015 1231   TRIG 143 07/30/2015 1231   HDL 41 (L) 07/30/2015 1231   CHOLHDL 3.4 07/30/2015 1231   VLDL 29 07/30/2015 1231   LDLCALC 70 07/30/2015 1231   LDLDIRECT 74 05/08/2014 1110    Additional studies/ records that were reviewed today include:    No new cardiac data.    ASSESSMENT:    1. SICK SINUS SYNDROME   2. Paroxysmal atrial fibrillation (HCC)   3. Chronic diastolic CHF (congestive heart failure) ([place])   4. CAD S/P RCA BMS, residual CFX disease   5. COPD, severe ([place])   6. On amiodarone therapy   7. Chronic anticoagulation      PLAN:  In order of problems listed above:  1. Controlled with pacemaker therapy and amiodarone. 2. Clinically maintaining sinus rhythm or paced rhythm. Pacer telemetry when last reported from August 2017 demonstrated 55 atrial high rate episodes with the longest lasting 1 hour. The patient is being treated with Eliquis to prevent stroke.CHADS VASC > 3. 3. Doing well without evidence of volume overload. Intermittent diuretic therapy is being use. 4. Coronary disease is asymptomatic. 5. Improvement in dyspnea complaints since switching to the new inhaler by Dr. Lamonte Sakai 6. Will do liver panel and lipid panel today and in 6 months when the patient returns. 7. Eliquis therapy with no complaints or complications.    Medication Adjustments/Labs and Tests Ordered: Current medicines are reviewed at length with the patient today.  Concerns regarding medicines are outlined above.  Medication changes, Labs and Tests ordered today are listed in the Patient Instructions below. Patient Instructions  Medication Instructions:  None  Labwork: TSH and liver today  Testing/Procedures: None  Follow-Up: Your physician wants you to follow-up in: 6 months with Dr. Gaspar Bidding will receive a reminder letter in the mail two months in advance. If you don't receive a letter, please call our office to schedule the follow-up appointment.   Any Other Special Instructions Will Be Listed Below (If Applicable).     If you need a refill on your cardiac medications before your next appointment, please call your pharmacy.      Signed, Sinclair Grooms, MD  03/10/2016 4:52 PM    [facility]  [facility] [address], [place], [place]  [address] Phone: [phone]; Fax: [phone]

## 2016-03-10 NOTE — Patient Instructions (Addendum)
Medication Instructions:  None  Labwork: TSH and liver today  Testing/Procedures: None  Follow-Up: Your physician wants you to follow-up in: 6 months with Dr. Gaspar Bidding will receive a reminder letter in the mail two months in advance. If you don't receive a letter, please call our office to schedule the follow-up appointment.   Any Other Special Instructions Will Be Listed Below (If Applicable).     If you need a refill on your cardiac medications before your next appointment, please call your pharmacy.

## 2016-03-11 LAB — HEPATIC FUNCTION PANEL
ALBUMIN: 4 g/dL (ref 3.2–4.6)
ALK PHOS: 59 IU/L (ref 39–117)
ALT: 15 IU/L (ref 0–32)
AST: 17 IU/L (ref 0–40)
BILIRUBIN, DIRECT: 0.16 mg/dL (ref 0.00–0.40)
Bilirubin Total: 0.5 mg/dL (ref 0.0–1.2)
TOTAL PROTEIN: 5.7 g/dL — AB (ref 6.0–8.5)

## 2016-03-11 LAB — TSH: TSH: 2.83 u[IU]/mL (ref 0.450–4.500)

## 2016-03-17 ENCOUNTER — Ambulatory Visit: Payer: Medicare Other | Admitting: Family Medicine

## 2016-03-18 ENCOUNTER — Telehealth: Payer: Self-pay | Admitting: Emergency Medicine

## 2016-03-18 MED ORDER — FLUTICASONE-UMECLIDIN-VILANT 100-62.5-25 MCG/INH IN AEPB: 1.0000 | INHALATION_SPRAY | Freq: Every day | RESPIRATORY_TRACT | 0 refills | Status: DC

## 2016-03-18 NOTE — Telephone Encounter (Signed)
Spoke with pt. Who stated she has an appointment with RB next week on thurs. And she likes the new inhaler he placed her on. She requested a sample of the inhaler so that will last her until her appointment. A sample is up front, she will have her daughter pick it up. Nothing further is needed at this time.

## 2016-03-23 DIAGNOSIS — H353211 Exudative age-related macular degeneration, right eye, with active choroidal neovascularization: Secondary | ICD-10-CM | POA: Diagnosis not present

## 2016-03-23 DIAGNOSIS — H353221 Exudative age-related macular degeneration, left eye, with active choroidal neovascularization: Secondary | ICD-10-CM | POA: Diagnosis not present

## 2016-03-24 ENCOUNTER — Encounter: Payer: Self-pay | Admitting: Emergency Medicine

## 2016-03-24 ENCOUNTER — Ambulatory Visit (INDEPENDENT_AMBULATORY_CARE_PROVIDER_SITE_OTHER): Payer: Medicare Other | Admitting: Emergency Medicine

## 2016-03-24 ENCOUNTER — Ambulatory Visit (INDEPENDENT_AMBULATORY_CARE_PROVIDER_SITE_OTHER): Payer: Medicare Other | Admitting: Family Medicine

## 2016-03-24 ENCOUNTER — Encounter: Payer: Self-pay | Admitting: Family Medicine

## 2016-03-24 VITALS — BP 118/62 | HR 100 | Temp 98.0°F | Wt 160.0 lb

## 2016-03-24 DIAGNOSIS — N184 Chronic kidney disease, stage 4 (severe): Secondary | ICD-10-CM

## 2016-03-24 DIAGNOSIS — I2510 Atherosclerotic heart disease of native coronary artery without angina pectoris: Secondary | ICD-10-CM

## 2016-03-24 DIAGNOSIS — Z9861 Coronary angioplasty status: Secondary | ICD-10-CM | POA: Diagnosis not present

## 2016-03-24 DIAGNOSIS — J302 Other seasonal allergic rhinitis: Secondary | ICD-10-CM | POA: Diagnosis not present

## 2016-03-24 DIAGNOSIS — J449 Chronic obstructive pulmonary disease, unspecified: Secondary | ICD-10-CM | POA: Diagnosis not present

## 2016-03-24 LAB — BASIC METABOLIC PANEL WITH GFR
BUN: 21 mg/dL (ref 7–25)
CHLORIDE: 107 mmol/L (ref 98–110)
CO2: 26 mmol/L (ref 20–31)
CREATININE: 1.23 mg/dL — AB (ref 0.60–0.88)
Calcium: 8.9 mg/dL (ref 8.6–10.4)
GFR, Est African American: 45 mL/min — ABNORMAL LOW (ref >=60)
GFR, Est Non African American: 39 mL/min — ABNORMAL LOW (ref >=60)
GLUCOSE: 77 mg/dL (ref 65–99)
Potassium: 4.3 mmol/L (ref 3.5–5.3)
Sodium: 143 mmol/L (ref 135–146)

## 2016-03-24 NOTE — Assessment & Plan Note (Signed)
Doing well on Trelegy. We will order through her pharmacy, continue. She has SABA available but has not needed.

## 2016-03-24 NOTE — Patient Instructions (Addendum)
We will continue your Trelegy once a day Use your albuterol 2 puffs up to every 4 hours if needed for shortness of breath.  Continue your fluticasone nasal spray 2 sprays each nostril as needed for congestion.  Follow with Dr Lamonte Sakai in 3 months or sooner if you have any problems.

## 2016-03-24 NOTE — Patient Instructions (Addendum)
Your lungs sound great! That Trelegy appears to be working quite well for you.  You can stop the Baby Aspirin since you are taking the Eliquis.  Remember to take Vitamin B12 1000 microgram tablet, one tablet daily for your B12 deficiency.   We drew blood today to check how your kidney's are doing.  Dr Yamilet Mcfayden will call you if your tests are not good. Otherwise he will send you a letter.  If you sign up for MyChart online, you will be able to see your test results once Dr Davionne Dowty has reviewed them.  If you do not hear from [place] with in 2 weeks please call our office    I will see you back in 3 months.

## 2016-03-24 NOTE — Progress Notes (Signed)
GERD  Subjective:    Patient ID: [name], female    DOB: 1923/03/05, 83 y.o.   MRN: [id number]  HPI 83 yo former smoker with a history of Horner disease, sick sinus syndrome, hypertension, chronic kidney disease, GERD with a hiatal hernia. She also has significant COPD for which she has been followed by Dr. Casper Harrison at [facility]. She was readmitted with A fib and RVR, associated with CP and dyspnea.   She has been managed on Spiriva and Advair. Has been treated with O2 at times in the past but is not on it currently She has not needed albuterol with any regularity.  She flares about once every 2 years. She has minimal coughing. She rarely wheezes. She uses lasix prn for LE edema.   She had Spirometry 2014 at [facility] >> FEV1 1.02L (60% pred).   ROV 07/14/15 -- patient has a history of CAD, hypertension, atrial fibrillation with diastolic CHF, COPD with an FEV1 of 1.02 L in 2014. Last seen in cardiology on 5/10 at which time she had been experiencing some orthostasis and hypotension. Her metoprolol was decreased from 50 mg down to 25 mg. She remains on Spiriva and Advair, uses albuterol when necessary. She was hospitalized x 2, once after a fall while on a cruise in mid-April. She was found to be hypoxemic, was told to restart her O2.   ROV 11/03/15 -- This is a follow-up visit for history of severe COPD with severe obstruction by spirometry. She also has a history of coronary disease, hypertension, age fibrillation and diastolic CHF as outlined above. She is only using lasix prn at this time, beginning about 3 weeks ago. She is having more trouble with mucous and some drainage. She was treated with azithro 8/31. Continues to have some associated dyspnea. She is now off lorazepam. She is on Advair and Spiriva.   Her daughter indicates that she snores loudly. She had a PSG remotely, no OSA.   ROV 02/11/16 -- This is a follow-up visit for history of diastolic CHF, atrial fibrillation, hypertension as well as  significant obstructive lung disease and COPD. Since our last visit she has been treated an acute exacerbation with pred + abx. She has rebounded and not quite back to baseline. Despite her limiting problems she has remains fairly active - She was able to go on a cruise to [place] in November. She has been doing a lot of coughing. She has not been able to get back home to live on her own - has been with her daughter. She has been on mucinex-DM. Also using Advair, Spiriva. Her neb use has decreased now to about 2x a day. She is having a lot of nasal congestion and mucous. Her energy level is decreased, not at baseline either. Her wt has been a little up and down, last week 160, today 153. No ankle swelling lately.    ROV 03/24/16 -- has a history of diastolic CHF, atrial fibrillation, hypertension anfd COPD, allergic rhinitis. We started Trelegy in mid Dec to see if she would benefit >> she likes it a lot, breathing has been much better. Minimal cough, minimal drainage. She is on fluticasone NS daily. She has not needed mucinex for about 10 days. She uses albuterol rarely. She tells me that her A fib is well controlled, is following with [facility].      Review of Systems  Constitutional: Negative for fever and unexpected weight change.  HENT: Positive for congestion. Negative for dental  problem, ear pain, nosebleeds, postnasal drip, rhinorrhea, sinus pressure, sneezing, sore throat and trouble swallowing.   Eyes: Negative for redness and itching.  Respiratory: Positive for shortness of breath. Negative for cough, chest tightness and wheezing.   Cardiovascular: Positive for palpitations. Negative for chest pain and leg swelling.  Gastrointestinal: Negative for nausea and vomiting.  Genitourinary: Negative for dysuria.  Musculoskeletal: Negative for joint swelling.  Skin: Negative for rash.  Neurological: Negative for headaches.  Hematological: Does not bruise/bleed easily.  Psychiatric/Behavioral:  Negative for dysphoric mood. The patient is nervous/anxious.     Past Medical History:  Diagnosis Date  . Abnormal mammogram, unspecified 08/23/2010   Followup imaging reassuring.   . Acute appendicitis with rupture   . ADENOMATOUS COLONIC POLYP 03/01/2003   Qualifier: Diagnosis of  By: McDiarmid MD, Sherren Mocha Multiple benign polyps of cecum, ascending, transverse and sigmoid colon by 8/09 colonoscopy by Dr Cristina Gong  Colonoscopy by Dr Cristina Gong for iron-deficiency anemia on 04/27/2010 showed three sessile polyps that were in ascending (3 mm x 9 mm), transverse (4 mm), and cecum (3 mm).  All three were tubular adenomas that were negative for high grade dysplasia or malignancy on pathology. Dr Cristina Gong called the polpys benign and not requiring follow-up in view of the patients age.    . Adrenal adenoma    Incidentaloma  . AF (paroxysmal atrial fibrillation) ([place]) 11/11/2010   Hospitalization (9/8-9/10, Dr Daneen Schick, III, Cardiology) for Paroxysmal Atrial Fibrillation with RVR and anginal pain secondary to demand/supply mismatch in setting of RVR with known circumflex artery branch disease.    Marland Kitchen ANXIETY 04/27/2006   Qualifier: Diagnosis of  By: McDiarmid MD, Sherren Mocha    . Candidiasis of the esophagus 11/26/2007  . CAP (community acquired pneumonia) 02/25/2015  . Cataract 2013   Bilateral   . Cellulitis of leg, right 10/01/2012  . Cellulitis of right lower extremity   . Chest pain with moderate risk of acute coronary syndrome 05/21/2015  . Chronic kidney disease (CKD), stage III (moderate)   . Concussion with loss of consciousness   . COPD 04/27/2006   Qualifier: Diagnosis of  By: McDiarmid MD, Sherren Mocha    . COPD exacerbation ([place])   . COPD, severe   . Decreased functional mobility and endurance 03/17/2015  . Diastolic heart failure ([place]) 07/30/2015  . DISC WITH RADICULOPATHY 04/27/2006   Qualifier: Diagnosis of  By: McDiarmid MD, Sherren Mocha    . EDEMA-LEGS,DUE TO VENOUS OBSTRUCT. 04/27/2006   Qualifier: Diagnosis of  By:  McDiarmid MD, Sherren Mocha    . 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 Normal uric acid serum level at time of attack.    Marland Kitchen HERNIA, HIATAL, NONCONGENITAL 04/27/2006   Qualifier: Diagnosis of  By: McDiarmid MD, Sherren Mocha    . High risk medications (not anticoagulants) long-term use 03/05/2012  . History of Hemorrhoids 04/27/2006   Qualifier: Diagnosis of  By: McDiarmid MD, Sherren Mocha    . History of iron deficiency 01/16/2015  . Hx of colonoscopy with polypectomy 04/27/2010   Dr Cristina Gong found three  tubular adenomas each less than 10 mm size  . Hypertension   . Hypotension 07/09/2015  . Iron deficiency anemia 08/05/2010   Dr Cristina Gong (GI) has evaluated with EGD, colonoscopy, and video capsular endoscopy in 2011 & 2012.  All have been unrevealing as to an origin of IDA.  OV with Dr Cristina Gong (10/28/10) assessment of blood in stool per  hemoccult and GER. Hbg 12.1 g/dL, MCV 91.8, Ferritin 30 ng/mL. Patient taking on ferrous sulfate tab daily.   EGD on 03/06/12 by Dr Cristina Gong for IDA non-obstructing Schatzki's ring at Gastroesophageal junction, otherwise normal esophagus and stomach.     . Leg cramps 05/29/2012  . Lumbar herniated disc    History of HNP L4/5 in 2003  . Macular degeneration, bilateral 10/04/2010   Right eye is wet MD, the other is dry macular degeneration (ARMD). Pt undergoing some form of vascular endothelial growth factor inhibition intraocular therapy.    . Mild cognitive impairment 10/26/2012   (10/25/12) Failed MiniCog screen  . MUSCLE CRAMPS 03/11/2010   Qualifier: Diagnosis of  By: McDiarmid MD, Sherren Mocha    . Muscle spasm of back 09/24/2013  . Myocardial infarct, old   . Numbness and tingling in hands 07/21/2011  . Pacemaker    MDT  . PREDIABETES 09/11/2007   Qualifier: Diagnosis of  By: McDiarmid MD, Sherren Mocha    . Retinal hemorrhage of left eye 06/2010  . RHINITIS, ALLERGIC 04/27/2006   Qualifier: Diagnosis of  By: McDiarmid MD, Sherren Mocha    .  SCHATZKI'S RING, HX OF 11/26/2007   Qualifier: Diagnosis of  By: McDiarmid MD, Sherren Mocha  An EGD was performed by Dr Cristina Gong on 04/27/2010 for iron deficiency anemia. There was a a transient hiatal hernia with Schatzki's ring. Stomach and duodenum were normal. EGD on 03/06/12 by Dr Cristina Gong for IDA non-obstructing Schatzki's ring at Gastroesophageal junction, otherwise normal esophagus and stomach.    . Seborrheic keratosis, right anterior thigh 12/12/2013  . Shoulder pain, left 01/09/2014  . Sick sinus syndrome with tachycardia ([place])    MDT  . Soft tissue injury of foot 05/03/2011  . Solar lentigo 06/15/2012  . Solitary kidney, acquired 05/17/2010   Surgical removal for transitional cell cancer by Tresa Endo, MD ([facility]). Surveillance cystoscopy by Dr Alinda Money [facility]) on 10/19/12 without evidence of cystoscopic recurrence. Recommend RTC one year for cystoscopy.   Marland Kitchen Spinal stenosis, lumbar   . Transitional cell carcinoma of ureter, history   . Urge incontinence 12/13/2011   Diagnosed in 10/2011 by Dr Bjorn Loser (Urology)   . VENTRICULAR HYPERTROPHY, LEFT 08/28/2008   Qualifier: Diagnosis of  By: McDiarmid MD, Sherren Mocha    . VITAMIN B12 DEFICIENCY 10/07/2009   Qualifier: Diagnosis of  By: McDiarmid MD, Sherren Mocha  Dx based on a post-TKR anemia work-up Low normal serum B12 with high Methylmalonic acid and homocysteine level  Vit B12 serum level (10/28/10) > 1500 pg/mL   . Vitamin D deficiency 11/02/2010   Serum vitamin D 25(OH) = 10.9 ng/mL (30 -100) on 10/28/10 c/w Vitamin D deficiency.        Family History  Problem Relation Age of Onset  . Dementia Mother   . Heart disease Father      Social History   Social History  . Marital status: Widowed    Spouse name: N/A  . Number of children: 4  . Years of education: N/A   Occupational History  . Futures trader Retired    Retired   Social History Main Topics  . Smoking status: Former Smoker    Packs/day: 1.00    Types: Cigarettes    Quit date:  03/02/2009  . Smokeless tobacco: Never Used  . Alcohol use 12.0 oz/week    14 Glasses of wine, 6 Standard drinks or equivalent per week     Comment: 4-5 glasses of wine per week  . Drug use: No  .  Sexual activity: No   Other Topics Concern  . 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    Semi-retired Futures trader.     Pt has home nebulizer for inhalation therapies.   Former Smoker   Smoking Status:  quit > 5 years ago      (+) DNR status per discussion with Dr McDiarmid 10/25/12 and reiterated 06/20/13 office visit .   (+) Living Will/Advance Directive   (+) HC-POA: Cecile Sheerer Causey (pt's dgt)     Allergies  Allergen Reactions  . Baclofen Other (See Comments)    Confusion occurred with taking 3 at the same time.  . Codeine Phosphate Nausea Only  . Norco [Hydrocodone-Acetaminophen] Nausea And Vomiting  . Hydrochlorothiazide Other (See Comments)    Possible acute gout or arthritis of wrist  . Montelukast Sodium Other (See Comments)    Unspecified reaction.      Outpatient Medications Prior to Visit  Medication Sig Dispense Refill  . albuterol (PROVENTIL) (2.5 MG/3ML) 0.083% nebulizer solution USE 1 VIAL VIA NEBULIZER EVERY 6 HOURS AS NEEDED FOR WHEEZING OR SHORTNESS OF BREATH 1350 mL 0  . amiodarone (PACERONE) 200 MG tablet TAKE 1 TABLET(200 MG) BY MOUTH DAILY 90 tablet 3  . apixaban (ELIQUIS) 2.5 MG TABS tablet Take 1 tablet (2.5 mg total) by mouth 2 (two) times daily. 60 tablet 5  . atorvastatin (LIPITOR) 20 MG tablet Take 1 tablet (20 mg total) by mouth daily. 30 tablet 11  . calcium carbonate (OSCAL) 1500 (600 Ca) MG TABS tablet Take 600 mg of elemental calcium by mouth 2 (two) times daily with a meal.    . Cholecalciferol 10000 units TABS Take 1 tablet by mouth daily.     Marland Kitchen  dextromethorphan-guaiFENesin (MUCINEX DM) 30-600 MG 12hr tablet Take 1 tablet by mouth 2 (two) times daily as needed for cough. 60 tablet 0  . fluticasone (FLONASE) 50 MCG/ACT nasal spray Place 2 sprays into both nostrils daily. 16 g 2  . Fluticasone-Umeclidin-Vilant (TRELEGY ELLIPTA) 100-62.5-25 MCG/INH AEPB Inhale 1 puff into the lungs daily. 14 each 0  . furosemide (LASIX) 40 MG tablet Take 20 mg by mouth daily as needed for fluid or edema.    . metoprolol succinate (TOPROL-XL) 25 MG 24 hr tablet Take 1 tablet (25 mg total) by mouth daily. 90 tablet 3  . nitroGLYCERIN (NITROSTAT) 0.4 MG SL tablet Place 1 tablet (0.4 mg total) under the tongue every 5 (five) minutes as needed for chest pain (x 3 tabs). 25 tablet PRN  . omeprazole (PRILOSEC) 20 MG capsule TAKE 1 CAPSULE(20 MG) BY MOUTH DAILY 90 capsule 3  . traMADol (ULTRAM) 50 MG tablet TAKE 1 TABLET BY MOUTH EVERY 6 HOURS AS NEEDED FOR PAIN 30 tablet 5  . VENTOLIN HFA 108 (90 Base) MCG/ACT inhaler INHALE 2 PUFFS WITH SPACER EVERY 4 HOURS AS NEEDED FOR WHEEZING 18 g 5  . vitamin B-12 (CYANOCOBALAMIN) 1000 MCG tablet Take 1,000 mcg by mouth daily.     No facility-administered medications prior to visit.          Objective:   Physical Exam  Vitals:   03/24/16 1050 03/24/16 1051  BP:  122/62  Pulse:  90  SpO2:  98%  Weight: 161 lb (73 kg)  Height: 5' 4.5" (1.638 m)    Gen: Pleasant, elderly woman in no distress,  normal affect  ENT: No lesions,  mouth clear,  oropharynx clear, no postnasal drip, normal voice.   Neck: No JVD, no stridor  Lungs: No use of accessory muscles, very clear today (improved)  Cardiovascular: Irregular, heart sounds normal, no murmur or gallops, no peripheral edema  Musculoskeletal: No deformities, no cyanosis or clubbing  Neuro: alert, non focal  Skin: Warm, no lesions or rashes      Assessment & Plan:  Allergic rhinitis, seasonal Doing well currently, continue fluticasone nasal spray as  ordered  COPD, severe ([place]) Doing well on Trelegy. We will order through her pharmacy, continue. She has SABA available but has not needed.   Baltazar Apo, MD, PhD 03/24/2016, 11:24 AM [facility] [phone] or if no answer [phone]

## 2016-03-24 NOTE — Assessment & Plan Note (Signed)
Established problem that has improved.  Patient planning on continuing Trelegy daily. PRN albuterol MDI or Nebs

## 2016-03-24 NOTE — Assessment & Plan Note (Signed)
Doing well currently, continue fluticasone nasal spray as ordered

## 2016-03-24 NOTE — Assessment & Plan Note (Signed)
Established problem Monitoring SCr today.

## 2016-03-24 NOTE — Progress Notes (Signed)
   Subjective:    Patient ID: [name], female    DOB: [birth year], 83 y.o.   MRN: [id number] [name] is alone Sources of clinical information for visit is/are patient and past medical records. Nursing assessment for this office visit was reviewed with the patient for accuracy and revision.   HPI  Edema - bilateral ankles - onset last week - improved with decreasing wine intake. - No shortness of breath.  COPd - Much improved with starting Trelegy - Has not had to take any albuterol since starting it - Able to climb flight of stairs in her home.    CHRONIC KIDNEY DISEASE STAGE 4 - Solitary kidney  - SCr 2.0 (02/04/16) - NSAIDS: no - ACEI/ARB: no - Diuretics: lasix 20 mg daily prn edema - Bone Metabolism: on Vitamin D and Calcium supplements - Potassium: no potassium supplements Edema at ankles that resolved.  No shortness of breath No cough    SH: Smoking Hx noted   Review of Systems No chest pain No abdominal pain    Objective:   Physical Exam VS reviewed GEN: Alert, Cooperative, Groomed, NAD HEENT: PERRL; EAC bilaterally not occluded, TM's translucent with normal LM, (+) LR;                No cervical LAN, No thyromegaly, No palpable masses COR: RRR, No M/G/R, No JVD, Normal PMI size and location LUNGS: BCTA, No Acc mm use, speaking in full sentences ABDOMEN: (+)BS, soft, NT, ND, No HSM, No palpable masses EXT: No peripheral leg edema.Gait: Normal speed, No significant path deviation, Step through +,  Psych: Normal affect/thought/speech/language    Assessment & Plan:  See problem list

## 2016-03-25 ENCOUNTER — Encounter: Payer: Self-pay | Admitting: Family Medicine

## 2016-03-30 ENCOUNTER — Telehealth: Payer: Self-pay | Admitting: Emergency Medicine

## 2016-03-30 DIAGNOSIS — H353211 Exudative age-related macular degeneration, right eye, with active choroidal neovascularization: Secondary | ICD-10-CM | POA: Diagnosis not present

## 2016-03-30 DIAGNOSIS — H353221 Exudative age-related macular degeneration, left eye, with active choroidal neovascularization: Secondary | ICD-10-CM | POA: Diagnosis not present

## 2016-03-30 NOTE — Telephone Encounter (Signed)
lmomtcb x1 

## 2016-03-31 ENCOUNTER — Encounter: Payer: Self-pay | Admitting: *Deleted

## 2016-03-31 NOTE — Telephone Encounter (Signed)
lmtcb x2 for pt. 

## 2016-04-04 NOTE — Telephone Encounter (Signed)
lmtcb X3 for patient.  Will close message per triage protocol.

## 2016-04-14 ENCOUNTER — Ambulatory Visit (INDEPENDENT_AMBULATORY_CARE_PROVIDER_SITE_OTHER): Payer: Medicare Other | Admitting: Family Medicine

## 2016-04-14 ENCOUNTER — Encounter: Payer: Self-pay | Admitting: Family Medicine

## 2016-04-14 VITALS — BP 132/60 | HR 93 | Temp 98.1°F | Ht 65.0 in | Wt 167.0 lb

## 2016-04-14 DIAGNOSIS — N184 Chronic kidney disease, stage 4 (severe): Secondary | ICD-10-CM

## 2016-04-14 DIAGNOSIS — J449 Chronic obstructive pulmonary disease, unspecified: Secondary | ICD-10-CM | POA: Diagnosis present

## 2016-04-14 DIAGNOSIS — D500 Iron deficiency anemia secondary to blood loss (chronic): Secondary | ICD-10-CM

## 2016-04-14 DIAGNOSIS — Z9861 Coronary angioplasty status: Secondary | ICD-10-CM | POA: Diagnosis not present

## 2016-04-14 DIAGNOSIS — I2510 Atherosclerotic heart disease of native coronary artery without angina pectoris: Secondary | ICD-10-CM

## 2016-04-14 LAB — CBC
HCT: 34 % — ABNORMAL LOW (ref 35.0–45.0)
HEMOGLOBIN: 10.8 g/dL — AB (ref 11.7–15.5)
MCH: 30.3 pg (ref 27.0–33.0)
MCHC: 31.8 g/dL — AB (ref 32.0–36.0)
MCV: 95.5 fL (ref 80.0–100.0)
MPV: 10.2 fL (ref 7.5–12.5)
PLATELETS: 241 10*3/uL (ref 140–400)
RBC: 3.56 MIL/uL — AB (ref 3.80–5.10)
RDW: 14.9 % (ref 11.0–15.0)
WBC: 5.8 10*3/uL (ref 3.8–10.8)

## 2016-04-14 MED ORDER — FLUTICASONE-UMECLIDIN-VILANT 100-62.5-25 MCG/INH IN AEPB: 1.0000 | INHALATION_SPRAY | Freq: Every day | RESPIRATORY_TRACT | 5 refills | Status: DC

## 2016-04-15 NOTE — Progress Notes (Signed)
Patient ID: [name], female   DOB: [birth year], 83 y.o.   MRN: [id number]     Subjective:    Patient ID: [name], female    DOB: [birth year], 83 y.o.   MRN: [id number] [name] is alone Sources of clinical information for visit is/are patient and past medical records. Nursing assessment for this office visit was reviewed with the patient for accuracy and revision.   HPI  Edema - bilateral ankles - onset last week - improved with decreasing wine intake. - No shortness of breath.  COPd - Much improved with starting Trelegy - Has not had to take any albuterol since starting it - Able to climb flight of stairs in her home.    CHRONIC KIDNEY DISEASE STAGE 4 - Solitary kidney  - SCr 2.0 (02/04/16) - NSAIDS: no - ACEI/ARB: no - Diuretics: lasix 20 mg daily prn edema - Bone Metabolism: on Vitamin D and Calcium supplements - Potassium: no potassium supplements Edema at ankles that resolved.  No shortness of breath No cough    SH: Smoking Hx noted   Review of Systems No chest pain No abdominal pain    Objective:   Physical Exam VS reviewed GEN: Alert, Cooperative, Groomed, NAD HEENT: PERRL; EAC bilaterally not occluded, TM's translucent with normal LM, (+) LR;                No cervical LAN, No thyromegaly, No palpable masses COR: RRR, No M/G/R, No JVD, Normal PMI size and location LUNGS: BCTA, No Acc mm use, speaking in full sentences ABDOMEN: (+)BS, soft, NT, ND, No HSM, No palpable masses EXT: No peripheral leg edema.Gait: Normal speed, No significant path deviation, Step through +,  Psych: Normal affect/thought/speech/language    Assessment & Plan:  See problem list

## 2016-04-15 NOTE — Assessment & Plan Note (Signed)
Basic Metabolic Panel:    Component Value Date/Time   NA 143 03/24/2016 1230   K 4.3 03/24/2016 1230   CL 107 03/24/2016 1230   CO2 26 03/24/2016 1230   BUN 21 03/24/2016 1230   CREATININE 1.23 (H) 03/24/2016 1230   GLUCOSE 77 03/24/2016 1230   CALCIUM 8.9 03/24/2016 1230   Stable No change.

## 2016-04-15 NOTE — Assessment & Plan Note (Signed)
Established problem Controlled Continue continue therapy

## 2016-04-28 ENCOUNTER — Ambulatory Visit (INDEPENDENT_AMBULATORY_CARE_PROVIDER_SITE_OTHER): Payer: Medicare Other | Admitting: Internal Medicine

## 2016-04-28 ENCOUNTER — Encounter: Payer: Self-pay | Admitting: Internal Medicine

## 2016-04-28 VITALS — BP 108/58 | HR 93 | Temp 97.9°F | Ht 65.0 in | Wt 163.8 lb

## 2016-04-28 DIAGNOSIS — Z9861 Coronary angioplasty status: Secondary | ICD-10-CM

## 2016-04-28 DIAGNOSIS — I2510 Atherosclerotic heart disease of native coronary artery without angina pectoris: Secondary | ICD-10-CM | POA: Diagnosis not present

## 2016-04-28 DIAGNOSIS — M7989 Other specified soft tissue disorders: Secondary | ICD-10-CM

## 2016-04-28 DIAGNOSIS — I5032 Chronic diastolic (congestive) heart failure: Secondary | ICD-10-CM | POA: Diagnosis not present

## 2016-04-28 LAB — COMPLETE METABOLIC PANEL WITH GFR
ALT: 10 U/L (ref 6–29)
AST: 17 U/L (ref 10–35)
Albumin: 3.9 g/dL (ref 3.6–5.1)
Alkaline Phosphatase: 58 U/L (ref 33–130)
BILIRUBIN TOTAL: 0.7 mg/dL (ref 0.2–1.2)
BUN: 34 mg/dL — ABNORMAL HIGH (ref 7–25)
CALCIUM: 8.7 mg/dL (ref 8.6–10.4)
CO2: 31 mmol/L (ref 20–31)
CREATININE: 1.79 mg/dL — AB (ref 0.60–0.88)
Chloride: 103 mmol/L (ref 98–110)
GFR, Est African American: 28 mL/min — ABNORMAL LOW (ref >=60)
GFR, Est Non African American: 25 mL/min — ABNORMAL LOW (ref >=60)
Glucose, Bld: 80 mg/dL (ref 65–99)
Potassium: 4.1 mmol/L (ref 3.5–5.3)
Sodium: 143 mmol/L (ref 135–146)
TOTAL PROTEIN: 6.1 g/dL (ref 6.1–8.1)

## 2016-04-28 LAB — CBC
HCT: 34.2 % — ABNORMAL LOW (ref 35.0–45.0)
Hemoglobin: 10.9 g/dL — ABNORMAL LOW (ref 11.7–15.5)
MCH: 30.5 pg (ref 27.0–33.0)
MCHC: 31.9 g/dL — ABNORMAL LOW (ref 32.0–36.0)
MCV: 95.8 fL (ref 80.0–100.0)
MPV: 10.2 fL (ref 7.5–12.5)
PLATELETS: 244 10*3/uL (ref 140–400)
RBC: 3.57 MIL/uL — ABNORMAL LOW (ref 3.80–5.10)
RDW: 15 % (ref 11.0–15.0)
WBC: 5.4 10*3/uL (ref 3.8–10.8)

## 2016-04-28 LAB — TSH: TSH: 3.12 mIU/L

## 2016-04-28 NOTE — Assessment & Plan Note (Signed)
Bilateral leg swelling and patient with a history of diastolic heart failure and CKD4. Most likely caused by patient's increased fluid intake encouraged by daughter. Denies any shortness of breath, orthopnea. Well's criteria are negative for DVT, less than 3 cm difference between right and left calf. No recent NSAID use. Unlikely COPD medication causing symptoms per review of adverse effects on up today. No concernfor some bleeding. Unlikely CHF, anemia, thyroid. Could have worsening CKD which could be contributing. Will repeat blood work obtained recently per discussion with patient. - COMPLETE METABOLIC PANEL WITH GFR - CBC - TSH - Brain natriuretic peptide -Discussed with patient to decrease fluid intake, elevate legs, and try to wear compression stockings for the next week -Discussed red flags for immediate follow-up - If no improvement over the next week patient to return to clinic

## 2016-04-28 NOTE — Patient Instructions (Signed)
I will do some blood work today. If symptoms worsen or change as we discussed please make a visit immediately. If swelling in legs continues please follow-up in one week. I would decrease the amount of fluid intake, elevating her legs as much as possible, try to wear her compression stockings to previously have been in the past.

## 2016-04-28 NOTE — Progress Notes (Signed)
   [facility] Kerrin Mo, MD Phone: [phone]  Reason For Visit: SDA for leg swelling   83 year old female with history of diastolic heart failure, CKD4 (one kidney) presenting with increased bilateral leg swelling of about 1 week's duration. States that she took a lasix yesterday 1 and today her legs are improved. Per patient she had increased swelling in left leg greater than right leg yesterday. However indicates that they look about the same today. Patient denies any shortness of breath, orthopnea. He recently started a inhaler medication called Trelegy about 2 weeks ago and wonders if that has any contribution. She denies any pain in her legs. Per daughter she has swelling in her legs on and off every once in a while and will often take a Lasix pill as needed. She recently has increased her fluid intake for the last week to try to keep well hydrated. She denies any increase in salt intake. She denies any bleeding or dizziness. No criteria are for DVT is negative with no recent travel, no history of cancer. She has used compression stockings in the past however none recently. She denies being on her feet more recently. She denies having any recent NSAID use.  Past Medical History Reviewed problem list.  Medications- reviewed and updated No additions to family history Social history- patient is a non- smoker  Objective: BP (!) 108/58   Pulse 93   Temp 97.9 F (36.6 C) (Oral)   Ht 5\' 5"  (1.651 m)   Wt 163 lb 12.8 oz (74.3 kg)   SpO2 93%   BMI 27.26 kg/m  Gen: NAD, alert, cooperative with exam Cardio: regular rate and rhythm, S1S2 heard, no murmurs appreciated Pulm: slight wheeze in upper lobes, otherwise clear to ausculation  Extremities: Bilateral swelling in legs, 1+ pitting edema to mid-calf, right mid calf 32 cm/ left mid calf 32.5 cm, no pain with dorsiflexion, 2+ DP pulse bilaterally, no calf tenderness   Assessment/Plan: See problem based a/p    Leg swelling Bilateral leg swelling and patient with a history of diastolic heart failure and CKD4. Most likely caused by patient's increased fluid intake encouraged by daughter. Denies any shortness of breath, orthopnea. Well's criteria are negative for DVT, less than 3 cm difference between right and left calf. No recent NSAID use. Unlikely COPD medication causing symptoms per review of adverse effects on up today. No concernfor some bleeding. Unlikely CHF, anemia, thyroid. Could have worsening CKD which could be contributing. Will repeat blood work obtained recently per discussion with patient. - COMPLETE METABOLIC PANEL WITH GFR - CBC - TSH - Brain natriuretic peptide -Discussed with patient to decrease fluid intake, elevate legs, and try to wear compression stockings for the next week -Discussed red flags for immediate follow-up - If no improvement over the next week patient to return to clinic

## 2016-04-29 LAB — BRAIN NATRIURETIC PEPTIDE: Brain Natriuretic Peptide: 384.6 pg/mL — ABNORMAL HIGH (ref <100)

## 2016-05-03 ENCOUNTER — Encounter: Payer: Self-pay | Admitting: Internal Medicine

## 2016-05-11 DIAGNOSIS — H353211 Exudative age-related macular degeneration, right eye, with active choroidal neovascularization: Secondary | ICD-10-CM | POA: Diagnosis not present

## 2016-05-11 DIAGNOSIS — H353221 Exudative age-related macular degeneration, left eye, with active choroidal neovascularization: Secondary | ICD-10-CM | POA: Diagnosis not present

## 2016-05-12 ENCOUNTER — Encounter: Payer: Self-pay | Admitting: Family Medicine

## 2016-05-12 ENCOUNTER — Ambulatory Visit (INDEPENDENT_AMBULATORY_CARE_PROVIDER_SITE_OTHER): Payer: Medicare Other | Admitting: Family Medicine

## 2016-05-12 VITALS — BP 110/68 | HR 97 | Temp 97.8°F | Wt 163.0 lb

## 2016-05-12 DIAGNOSIS — S91002A Unspecified open wound, left ankle, initial encounter: Secondary | ICD-10-CM | POA: Diagnosis not present

## 2016-05-12 DIAGNOSIS — Z9861 Coronary angioplasty status: Secondary | ICD-10-CM

## 2016-05-12 DIAGNOSIS — S81002A Unspecified open wound, left knee, initial encounter: Secondary | ICD-10-CM

## 2016-05-12 DIAGNOSIS — S81802A Unspecified open wound, left lower leg, initial encounter: Secondary | ICD-10-CM | POA: Diagnosis not present

## 2016-05-12 DIAGNOSIS — S91009A Unspecified open wound, unspecified ankle, initial encounter: Secondary | ICD-10-CM

## 2016-05-12 DIAGNOSIS — S81809A Unspecified open wound, unspecified lower leg, initial encounter: Secondary | ICD-10-CM

## 2016-05-12 DIAGNOSIS — S81009A Unspecified open wound, unspecified knee, initial encounter: Secondary | ICD-10-CM | POA: Insufficient documentation

## 2016-05-12 DIAGNOSIS — I872 Venous insufficiency (chronic) (peripheral): Secondary | ICD-10-CM | POA: Diagnosis not present

## 2016-05-12 DIAGNOSIS — I2510 Atherosclerotic heart disease of native coronary artery without angina pectoris: Secondary | ICD-10-CM | POA: Diagnosis not present

## 2016-05-12 NOTE — Patient Instructions (Signed)
Start wearing prescription compression hose on both legs whenever you are standing up, as much as possible.  It is all right to wear the hose at night during sleep.   This compression will help your leg wound near your ankle to heal  Keep the ankle wound covered with the thick yellowish bandage until wound is well healed. It may take 4 to 6 weeks for it to heal completely.   Replace the bandage every week until wound healed completely.  Replace bandage before a week has passed only if it starts to leak.  If the edges start to curl up, tape them down with any tape that is safe for skin like surgical tape.

## 2016-05-12 NOTE — Progress Notes (Signed)
[name] is accompanied by daughter, [name] Sources of clinical information for visit is/are patient, relative(s) and past medical records. Nursing assessment for this office visit was reviewed with the patient for accuracy and revision.    Leg edema - Location: Bilateral - Level: ankles - Onset: gradual last couple weeks - Progression: rapidly improving - Treatment: Diuretic medication not taking lasix                       Elevation: no,     IMPROVED overnight in bed            Compression with not using:           Low Salt Diet <3 gram sodium per day - Leg dependency during day: yes - Recent IVFs: no    - PMH:   Heart Failure:Yes, Type: G2DD HFpEF;  Last EF% = 55-60 on   Liver Disease: no  Advanced Renal Disease: CKD IV  Protein Malnutrition: no  Hypoalbuminemia:no  Chronic Venous Insufficiency: yes    History of chronic leg edema:yes, Laterality?  Bilateral  T  Thyroid disease: no  Anemia:no   - Associated Symptoms:   Dyspnea: no  Orthopnea:no  DOE: no  Abdominal swelling:no    Well's DVT Criteria:On Apixaban   Medications  Is patient on DHP CCB, Hydralazine, Pregabalin, Megace, Thiazolidinedione,  NSAID, Systemic Corticosteroid or Estrogen? no  Recent weight trends:  There were no vitals filed for this visit.  Most Recent Labs             BMP Latest Ref Rng & Units 04/28/2016 03/24/2016 02/04/2016  Glucose 65 - 99 mg/dL 80 77 102(H)  BUN 7 - 25 mg/dL 34(H) 21 52(H)  Creatinine 0.60 - 0.88 mg/dL 1.79(H) 1.23(H) 1.99(H)  Sodium 135 - 146 mmol/L 143 143 139  Potassium 3.5 - 5.3 mmol/L 4.1 4.3 5.3  Chloride 98 - 110 mmol/L 103 107 103  CO2 20 - 31 mmol/L 31 26 25   Calcium 8.6 - 10.4 mg/dL 8.7 8.9 9.2                 CMP Latest Ref Rng & Units 04/28/2016 03/24/2016 03/10/2016  Glucose 65 - 99 mg/dL 80 77 -  BUN 7 - 25 mg/dL 34(H) 21 -  Creatinine 0.60 - 0.88 mg/dL 1.79(H) 1.23(H) -  Sodium 135 - 146 mmol/L 143 143 -  Potassium 3.5 - 5.3 mmol/L 4.1 4.3 -   Chloride 98 - 110 mmol/L 103 107 -  CO2 20 - 31 mmol/L 31 26 -  Calcium 8.6 - 10.4 mg/dL 8.7 8.9 -  Total Protein 6.1 - 8.1 g/dL 6.1 - 5.7(L)  Total Bilirubin 0.2 - 1.2 mg/dL 0.7 - 0.5  Alkaline Phos 33 - 130 U/L 58 - 59  AST 10 - 35 U/L 17 - 17  ALT 6 - 29 U/L 10 - 15                Lab Results  Component Value Date   TSH 3.12 04/28/2016     BNP (last 3 results)   Recent Labs  11/09/15 0100 01/29/16 0856 04/28/16 1001  BNP 244.1* 550.2* 384.6*     ProBNP (last 3 results)  No results for input(s): PROBNP in the last 8760 hours.  ROS Denies Fever/ChillsReports Pain in Legleft leg ankle wound Denies Leg Rash Denies Itching Legs      Temp Readings from Last 3 Encounters:  04/28/16 97.9 F (36.6 C) (  Oral)  04/14/16 98.1 F (36.7 C) (Oral)  03/24/16 98 F (36.7 C) (Oral)   BP Readings from Last 3 Encounters:  04/28/16 (!) 108/58  04/14/16 132/60  03/24/16 118/62   Pulse Readings from Last 3 Encounters:  04/28/16 93  04/14/16 93  03/24/16 100    General: in no apparent distress and well developed and well nourished Eyes: no periorbital edema Neck: no adenopathy, no carotid bruit, no JVD, supple, symmetrical, trachea midline and thyroid not enlarged, symmetric, no tenderness/mass/nodules; Respiratory: Normal expansion.  Clear to auscultation.  No rales, rhonchi, or wheezing. Cardiovascular: Heart sounds are normal.  Regular rate and rhythm without murmur, gallop or rub.;  Legs: trace to 1+ bilateral pedal edema,  The patient has changes of chronic venous stasis disease., Leg skin 1-2 cm flap superficial injury left medial just above medial malleolus Abdomin: Soft, non-tender, normal bowel sounds; no bruits, organomegaly or masses.,   Leg Edema - Ddx:  Venous insufficiency,   Treatment recommendations: support stockings 20 mmHg graduated compression hose to help heal flap wound of left medial lower leg, elevate feet above the level of the heart whenever  possible and use of compression stockings  Leg wound, flap - Compression therapy 20 mmHG - Colloid dressing, change weekly until healed. - leg elevation every 2 to 3 hours for 15 minutes.

## 2016-05-15 ENCOUNTER — Other Ambulatory Visit: Payer: Self-pay | Admitting: Emergency Medicine

## 2016-05-18 DIAGNOSIS — H353221 Exudative age-related macular degeneration, left eye, with active choroidal neovascularization: Secondary | ICD-10-CM | POA: Diagnosis not present

## 2016-05-18 DIAGNOSIS — H353211 Exudative age-related macular degeneration, right eye, with active choroidal neovascularization: Secondary | ICD-10-CM | POA: Diagnosis not present

## 2016-05-19 ENCOUNTER — Telehealth: Payer: Self-pay | Admitting: Emergency Medicine

## 2016-05-19 ENCOUNTER — Other Ambulatory Visit: Payer: Self-pay | Admitting: Family Medicine

## 2016-05-19 DIAGNOSIS — J449 Chronic obstructive pulmonary disease, unspecified: Secondary | ICD-10-CM

## 2016-05-19 MED ORDER — PREDNISONE 20 MG PO TABS: 20.0000 mg | ORAL_TABLET | Freq: Every day | ORAL | 1 refills | Status: DC

## 2016-05-19 MED ORDER — FLUTICASONE-UMECLIDIN-VILANT 100-62.5-25 MCG/INH IN AEPB: 1.0000 | INHALATION_SPRAY | Freq: Every day | RESPIRATORY_TRACT | 0 refills | Status: DC

## 2016-05-19 MED ORDER — AZITHROMYCIN 250 MG PO TABS: ORAL_TABLET | ORAL | 1 refills | Status: DC

## 2016-05-19 NOTE — Progress Notes (Signed)
Rxs for patient to self initiate at signs of COPD acute exacerbation

## 2016-05-19 NOTE — Telephone Encounter (Signed)
Patient called back and gave phone number [phone] -pr

## 2016-05-19 NOTE — Telephone Encounter (Signed)
Spoke with pt and she was upset that her inhalers that was sent in by Dr. McDiarmid was for a 2 week sample. Seeing as Dr. Lamonte Sakai was the original prescriber contacted the pharmacy and corrected the pt's rx so that she gets a 30 day supply. Pt is aware and because she just spent $45 on a 2 week sample from the pharmacy, samples were placed up front until pt is able to pick up her new rx. Nothing further is needed at this time

## 2016-05-19 NOTE — Telephone Encounter (Signed)
Patient again gave me a different number [phone] -pr

## 2016-05-19 NOTE — Telephone Encounter (Signed)
Patient returning call, CB [phone], read the number back to her and she said this was correct (third different number this pm).

## 2016-05-19 NOTE — Telephone Encounter (Signed)
lmomtcb x1 

## 2016-05-22 ENCOUNTER — Other Ambulatory Visit: Payer: Self-pay | Admitting: Family Medicine

## 2016-05-26 ENCOUNTER — Telehealth: Payer: Self-pay | Admitting: Family Medicine

## 2016-05-26 ENCOUNTER — Other Ambulatory Visit: Payer: Self-pay | Admitting: Physician Assistant

## 2016-05-26 ENCOUNTER — Telehealth: Payer: Self-pay | Admitting: Interventional Cardiology

## 2016-05-26 MED ORDER — FUROSEMIDE 40 MG PO TABS: 20.0000 mg | ORAL_TABLET | Freq: Every day | ORAL | 99 refills | Status: DC | PRN

## 2016-05-26 NOTE — Telephone Encounter (Signed)
called pharmacy to fill Amiodarone that has been sent to them since jan 2018 & ask them to call pt and let her know that its there, spoke with [facility] & its filled and waiting.

## 2016-05-26 NOTE — Telephone Encounter (Signed)
furosemide (LASIX) 40 MG tablet  Medication  Date: 05/26/2016 Department: [facility] Ordering/Authorizing: Blane Ohara McDiarmid, MD  Order Providers   Prescribing Provider Encounter Provider  Blane Ohara McDiarmid, MD Blane Ohara McDiarmid, MD  Medication Detail    Disp Refills Start End   furosemide (LASIX) 40 MG tablet 30 tablet prn 05/26/2016    Sig - Route: Take 0.5 tablets (20 mg total) by mouth daily as needed for fluid or edema. - Oral   E-Prescribing Status: Receipt confirmed by pharmacy (05/26/2016 1:39 PM EDT)   Pharmacy   [id number] - [place], [address] [place]

## 2016-05-26 NOTE — Telephone Encounter (Signed)
I spoke with Mrs Heinke.   The furosemide was inadvertently declined on eprescribing, but intent was to refill it. Rx furosemide was sent to her pharmacy today.   I explained to her that her Dr [name] (Card) prescribes her amiodarone.  I asked that she contact their office for the prescription.

## 2016-05-26 NOTE — Telephone Encounter (Signed)
New message     *STAT* If patient is at the pharmacy, call can be transferred to refill team.   1. Which medications need to be refilled? (please list name of each medication and dose if known) amiodarone 200 mg   2. Which pharmacy/location (including street and city if local pharmacy) is medication to be sent to? [facility] on [address] [phone]  3. Do they need a 30 day or 90 day supply? 90 day

## 2016-05-26 NOTE — Telephone Encounter (Signed)
Pt is calling because she doesn't understand why her doctor refused her Furosemide and Amiodarone. She said she only takes the Furosemide when her ankles swell up and they don't always swell . She is leaving at 5 AM tomorrow for a 10 day cruise and she would hate to need the medication and not  Be able to get any while she is out at sea. She said that if he doesn't want her taking these he should inform her so she doesn't keep asking for something that she doesn't nee. Please call today so that she knows what the outcome will be. jw

## 2016-06-10 ENCOUNTER — Telehealth: Payer: Self-pay | Admitting: Emergency Medicine

## 2016-06-10 DIAGNOSIS — J449 Chronic obstructive pulmonary disease, unspecified: Secondary | ICD-10-CM

## 2016-06-10 MED ORDER — FLUTICASONE-UMECLIDIN-VILANT 100-62.5-25 MCG/INH IN AEPB: 1.0000 | INHALATION_SPRAY | Freq: Every day | RESPIRATORY_TRACT | 0 refills | Status: DC

## 2016-06-10 NOTE — Telephone Encounter (Signed)
lmomtcb x1 

## 2016-06-10 NOTE — Telephone Encounter (Signed)
Spoke with pt and she requested samples to take with her when she goes to the beach. Samples placed up front, she is aware whomever picks this up needs to have I.D. She had no further questions, nothing further is needed.

## 2016-06-10 NOTE — Telephone Encounter (Signed)
Says she needs by 12 going out of town.Hillery Hunter

## 2016-06-10 NOTE — Telephone Encounter (Signed)
Patient returned phone call.. Patient states need Trelegy  inhaler, takes 1 puff a day.. Going out of town, only have 2 puffs left.Marland KitchenMarland KitchenPatient contact # [phone].Marland KitchenMearl Stewart

## 2016-06-15 ENCOUNTER — Encounter: Payer: Medicare Other | Admitting: Internal Medicine

## 2016-06-20 ENCOUNTER — Encounter: Payer: Self-pay | Admitting: Internal Medicine

## 2016-06-22 DIAGNOSIS — H353211 Exudative age-related macular degeneration, right eye, with active choroidal neovascularization: Secondary | ICD-10-CM | POA: Diagnosis not present

## 2016-06-22 DIAGNOSIS — H353221 Exudative age-related macular degeneration, left eye, with active choroidal neovascularization: Secondary | ICD-10-CM | POA: Diagnosis not present

## 2016-06-23 ENCOUNTER — Ambulatory Visit: Payer: Medicare Other | Admitting: Family Medicine

## 2016-06-23 ENCOUNTER — Encounter: Payer: Self-pay | Admitting: Family Medicine

## 2016-06-23 ENCOUNTER — Ambulatory Visit (INDEPENDENT_AMBULATORY_CARE_PROVIDER_SITE_OTHER): Payer: Medicare Other | Admitting: Emergency Medicine

## 2016-06-23 ENCOUNTER — Ambulatory Visit (INDEPENDENT_AMBULATORY_CARE_PROVIDER_SITE_OTHER): Payer: Medicare Other | Admitting: Family Medicine

## 2016-06-23 ENCOUNTER — Encounter: Payer: Self-pay | Admitting: Emergency Medicine

## 2016-06-23 VITALS — BP 144/82 | HR 91 | Temp 98.2°F | Wt 166.0 lb

## 2016-06-23 DIAGNOSIS — J449 Chronic obstructive pulmonary disease, unspecified: Secondary | ICD-10-CM | POA: Diagnosis not present

## 2016-06-23 DIAGNOSIS — J302 Other seasonal allergic rhinitis: Secondary | ICD-10-CM | POA: Diagnosis not present

## 2016-06-23 DIAGNOSIS — N3941 Urge incontinence: Secondary | ICD-10-CM

## 2016-06-23 DIAGNOSIS — N1831 Chronic kidney disease, stage 3a: Secondary | ICD-10-CM

## 2016-06-23 DIAGNOSIS — I2510 Atherosclerotic heart disease of native coronary artery without angina pectoris: Secondary | ICD-10-CM

## 2016-06-23 DIAGNOSIS — N183 Chronic kidney disease, stage 3 (moderate): Secondary | ICD-10-CM | POA: Diagnosis not present

## 2016-06-23 DIAGNOSIS — I480 Paroxysmal atrial fibrillation: Secondary | ICD-10-CM | POA: Diagnosis not present

## 2016-06-23 DIAGNOSIS — Z9861 Coronary angioplasty status: Secondary | ICD-10-CM

## 2016-06-23 DIAGNOSIS — G3184 Mild cognitive impairment, so stated: Secondary | ICD-10-CM

## 2016-06-23 MED ORDER — FLUTICASONE-UMECLIDIN-VILANT 100-62.5-25 MCG/INH IN AEPB: 1.0000 | INHALATION_SPRAY | Freq: Every day | RESPIRATORY_TRACT | 0 refills | Status: DC

## 2016-06-23 NOTE — Assessment & Plan Note (Signed)
Temporarily increase your flonase nasal spray to 2 sprays each nostril twice a day.

## 2016-06-23 NOTE — Progress Notes (Signed)
GERD  Subjective:    Patient ID: [name], female    DOB: May 03, 1923, 83 y.o.   MRN: [id number]  HPI 83 yo former smoker with a history of Horner disease, sick sinus syndrome, hypertension, chronic kidney disease, GERD with a hiatal hernia. She also has significant COPD for which she has been followed by Dr. Casper Harrison at [facility] She was readmitted with A fib and RVR, associated with CP and dyspnea.   Her daughter indicates that she snores loudly. She had a PSG remotely, no OSA.   ROV 02/11/16 -- This is a follow-up visit for history of diastolic CHF, atrial fibrillation, hypertension as well as significant obstructive lung disease and COPD. Since our last visit she has been treated an acute exacerbation with pred + abx. She has rebounded and not quite back to baseline. Despite her limiting problems she has remains fairly active - She was able to go on a cruise to [place] in November. She has been doing a lot of coughing. She has not been able to get back home to live on her own - has been with her daughter. She has been on mucinex-DM. Also using Advair, Spiriva. Her neb use has decreased now to about 2x a day. She is having a lot of nasal congestion and mucous. Her energy level is decreased, not at baseline either. Her wt has been a little up and down, last week 160, today 153. No ankle swelling lately.    ROV 03/24/16 -- has a history of diastolic CHF, atrial fibrillation, hypertension anfd COPD, allergic rhinitis. We started Trelegy in mid Dec to see if she would benefit >> she likes it a lot, breathing has been much better. Minimal cough, minimal drainage. She is on fluticasone NS daily. She has not needed mucinex for about 10 days. She uses albuterol rarely. She tells me that her A fib is well controlled, is following with [facility].   ROV 06/23/16 -- This is a follcow-up visit for COPD. She also has a history of hypertension with diastolic CHF, atrial fibrillation. We have been managing her on  Trelegy. Some difficulty getting the Trelegy from [facility] - they don't stock it well.   She reports that she is benefiting significantly from the trelegy, feels that it helps her more than her prior regimens. She has had some difficulty climbing stairs, has to stop to catch her breath. She has not had any acute exacerbations. No significant cough, lots of clear nasal gtt. She has not needed any albuterol since she has been on the Trelegy.      Review of Systems  Constitutional: Negative for fever and unexpected weight change.  HENT: Positive for congestion. Negative for dental problem, ear pain, nosebleeds, postnasal drip, rhinorrhea, sinus pressure, sneezing, sore throat and trouble swallowing.   Eyes: Negative for redness and itching.  Respiratory: Positive for shortness of breath. Negative for cough, chest tightness and wheezing.   Cardiovascular: Positive for palpitations. Negative for chest pain and leg swelling.  Gastrointestinal: Negative for nausea and vomiting.  Genitourinary: Negative for dysuria.  Musculoskeletal: Negative for joint swelling.  Skin: Negative for rash.  Neurological: Negative for headaches.  Hematological: Does not bruise/bleed easily.  Psychiatric/Behavioral: Negative for dysphoric mood. The patient is nervous/anxious.         Objective:   Physical Exam  Vitals:   06/23/16 1003  BP: (!) 150/80  Pulse: 90  SpO2: 97%  Weight: 169 lb 3.2 oz (76.7 kg)  Height: 5' 4.5" (1.638  m)   Gen: Pleasant, elderly woman in no distress,  normal affect  ENT: No lesions,  mouth clear,  oropharynx clear, no postnasal drip, normal voice.   Neck: No JVD, no stridor  Lungs: No use of accessory muscles, very clear today (improved)  Cardiovascular: Irregular, heart sounds normal, no murmur or gallops, no peripheral edema  Musculoskeletal: No deformities, no cyanosis or clubbing  Neuro: alert, non focal  Skin: Warm, no lesions or rashes      Assessment & Plan:   COPD, severe ([place]) Please continue Your Trelegy once a day Keep Ventolin available to use 2 puffs up to every 4 hours if needed for shortness of breath.  Follow with Dr Lamonte Sakai in 4 months or sooner if you have any problems.  Allergic rhinitis, seasonal Temporarily increase your flonase nasal spray to 2 sprays each nostril twice a day.   Baltazar Apo, MD, PhD 06/23/2016, 10:28 AM [facility] [phone] or if no answer [phone]

## 2016-06-23 NOTE — Addendum Note (Signed)
Addended by: Jannette Spanner on: 06/23/2016 10:46 AM   Modules accepted: Orders

## 2016-06-23 NOTE — Assessment & Plan Note (Signed)
Please continue Your Trelegy once a day Keep Ventolin available to use 2 puffs up to every 4 hours if needed for shortness of breath.  Follow with Dr Lamonte Sakai in 4 months or sooner if you have any problems.

## 2016-06-23 NOTE — Patient Instructions (Signed)
We are checking your kidney today.

## 2016-06-23 NOTE — Patient Instructions (Signed)
Please continue Your Trelegy once a day Keep Ventolin available to use 2 puffs up to every 4 hours if needed for shortness of breath.  Temporarily increase your flonasa nasal spray to 2 sprays each nostril twice a day.  Follow with Dr Lamonte Sakai in 4 months or sooner if you have any problems.

## 2016-06-24 ENCOUNTER — Encounter: Payer: Self-pay | Admitting: Family Medicine

## 2016-06-24 LAB — BASIC METABOLIC PANEL
BUN/Creatinine Ratio: 26 (ref 12–28)
BUN: 33 mg/dL (ref 10–36)
CO2: 26 mmol/L (ref 18–29)
Calcium: 9.2 mg/dL (ref 8.7–10.3)
Chloride: 101 mmol/L (ref 96–106)
Creatinine, Ser: 1.25 mg/dL — ABNORMAL HIGH (ref 0.57–1.00)
GFR calc Af Amer: 44 mL/min/{1.73_m2} — ABNORMAL LOW (ref >59)
GFR calc non Af Amer: 38 mL/min/{1.73_m2} — ABNORMAL LOW (ref >59)
Glucose: 85 mg/dL (ref 65–99)
Potassium: 4.9 mmol/L (ref 3.5–5.2)
SODIUM: 141 mmol/L (ref 134–144)

## 2016-06-24 NOTE — Assessment & Plan Note (Signed)
Improved Estimated Creatinine Clearance: 30.1 mL/min (A) (by C-G formula based on SCr of 1.25 mg/dL (H)). iPTH pending.

## 2016-06-24 NOTE — Assessment & Plan Note (Signed)
Currently in sinus rhythm  On Amiodarone - followed by Dr Pernell Dupre (Card) Taking Eliquis without bleeding. Continue current meds.

## 2016-06-24 NOTE — Progress Notes (Signed)
Your kidney is working well.  It is better than it was around your birthday last year.   If you want to use the Lasix (furosemide) once a week, it will not likely cause much problems with your kidney.  I would not take more than once a week without [place] checking monitoring your kidney with lab tests.

## 2016-06-24 NOTE — Progress Notes (Signed)
Patient ID: [name], female   DOB: Sep 18, 1923, 83 y.o.   MRN: [id number]     Subjective:    Patient ID: [name], female    DOB: Jan 30, 1924, 83 y.o.   MRN: [id number] [name] is alone Sources of clinical information for visit is/are patient and past medical records. Nursing assessment for this office visit was reviewed with the patient for accuracy and revision.   HPI   Paroxysmal Atrial Fibrillation. - Symptoms include none. Onset was several years ago, and have been stable since that time. Associated symptoms include: none. The patient denies chest pain, palpitations and syncope. The patient has a past history of atrial fibrillation. Pt with hx of Tachy-brady SSS with pacemaker.   Edema - bilateral ankles - intermittent.  Using Lasix once or twice a month.  Would like to be able to use more frequently. - No shortness of breath. -Improves overnite.  Worsens with standing during day  COPD - Continues to be improved with Trelegy - Has continued to not needing rescue albuterol since starting Trelegy - Able to climb flight of stairs in her home. Able to perform iADLs.  She continues to drive. - No falls. No cough, no sputum production. No DOE.  Completed her recent [place] cruise without difficulty  - Pt saw Dr Lamonte Sakai today.  No new therapies started.     CHRONIC KIDNEY DISEASE STAGE 4 - Solitary kidney  - SCr 2.0 (02/04/16) - NSAIDS: no - ACEI/ARB: no - Diuretics: lasix 20 mg daily prn edema - Bone Metabolism: on Vitamin D and Calcium supplements - Potassium: no potassium supplements  Mild Cognitive Impairment - No falls.  No getting lost. Dgt has managed finances for last several years.   Urge Incontinence - chronic issue - intermittent worsening then improving.   -Getting up twice a night to urinate most nights.  Occasional periods where has to get up several times a night to void. - No dysuria or frequency.  No suprapubic pain.   SH: Smoking Hx  noted Medications: Reviewed and Updated  Review of Systems No chest pain No abdominal pain    Objective:   Physical Exam VS reviewed GEN: Alert, Cooperative, Groomed, NAD HEENT: PERRL; EAC bilaterally not occluded, TM's translucent with normal LM, (+) LR;                No cervical LAN, No thyromegaly, No palpable masses COR: RRR, No M/G/R, No JVD, Normal PMI size and location LUNGS: BCTA, No Acc mm use, speaking in full sentences ABDOMEN: (+)BS, soft, NT, ND, No HSM, No palpable masses EXT: No peripheral leg edema.Gait: Normal speed, No significant path deviation, Step through +,  Skin: no open wounds of lower legs. Scattered ecchymoses on dorsum of forearms Psych: Normal affect/thought/speech/language    Assessment & Plan:  See problem list

## 2016-06-24 NOTE — Assessment & Plan Note (Signed)
Established problem worsened.  Pt seen Dr Nicki Reaper MacDiarmid ([facility]) in 2013.  Patient not interested in restart of Toviaz from Dr Matilde Sprang for nocturia and its interference with sleep.  Mrs Everding to call if she decides to restart Toviaz. Recommended timed voiding with progressive lengthening time.

## 2016-06-24 NOTE — Assessment & Plan Note (Signed)
Established problem. Stable. No decline in iADLs. Continue to monitor function.

## 2016-06-24 NOTE — Assessment & Plan Note (Signed)
Established problem Stable Continue Trelegyand prn SABA

## 2016-06-29 ENCOUNTER — Other Ambulatory Visit: Payer: Self-pay | Admitting: Family Medicine

## 2016-07-04 ENCOUNTER — Telehealth: Payer: Self-pay | Admitting: Family Medicine

## 2016-07-04 NOTE — Telephone Encounter (Signed)
Daughter states PCP wanted to see her in May, but he has no appointments until June 14th. Daughter wants to know if PCP will be willing to squeeze pt in, please call [name] and let her know [phone]. ep

## 2016-07-04 NOTE — Telephone Encounter (Signed)
Will forward to MD to advise. Jazmin Hartsell,CMA  

## 2016-07-07 DIAGNOSIS — Z01419 Encounter for gynecological examination (general) (routine) without abnormal findings: Secondary | ICD-10-CM | POA: Diagnosis not present

## 2016-07-07 DIAGNOSIS — Z6829 Body mass index (BMI) 29.0-29.9, adult: Secondary | ICD-10-CM | POA: Diagnosis not present

## 2016-07-11 NOTE — Telephone Encounter (Signed)
Please call Ms Decoursey to let her know that waiting until June 14th is fine.

## 2016-07-11 NOTE — Telephone Encounter (Signed)
Daughter is aware of this. Jazmin Hartsell,CMA

## 2016-07-12 DIAGNOSIS — D508 Other iron deficiency anemias: Secondary | ICD-10-CM | POA: Diagnosis not present

## 2016-07-13 DIAGNOSIS — H353211 Exudative age-related macular degeneration, right eye, with active choroidal neovascularization: Secondary | ICD-10-CM | POA: Diagnosis not present

## 2016-07-13 DIAGNOSIS — H353221 Exudative age-related macular degeneration, left eye, with active choroidal neovascularization: Secondary | ICD-10-CM | POA: Diagnosis not present

## 2016-07-14 DIAGNOSIS — D2239 Melanocytic nevi of other parts of face: Secondary | ICD-10-CM | POA: Diagnosis not present

## 2016-07-14 DIAGNOSIS — L723 Sebaceous cyst: Secondary | ICD-10-CM | POA: Diagnosis not present

## 2016-07-14 DIAGNOSIS — L720 Epidermal cyst: Secondary | ICD-10-CM | POA: Diagnosis not present

## 2016-07-20 DIAGNOSIS — H26492 Other secondary cataract, left eye: Secondary | ICD-10-CM | POA: Diagnosis not present

## 2016-07-20 DIAGNOSIS — H524 Presbyopia: Secondary | ICD-10-CM | POA: Diagnosis not present

## 2016-07-20 DIAGNOSIS — H353231 Exudative age-related macular degeneration, bilateral, with active choroidal neovascularization: Secondary | ICD-10-CM | POA: Diagnosis not present

## 2016-07-27 DIAGNOSIS — H353231 Exudative age-related macular degeneration, bilateral, with active choroidal neovascularization: Secondary | ICD-10-CM | POA: Diagnosis not present

## 2016-07-28 ENCOUNTER — Other Ambulatory Visit: Payer: Self-pay | Admitting: Family Medicine

## 2016-07-28 DIAGNOSIS — R269 Unspecified abnormalities of gait and mobility: Secondary | ICD-10-CM | POA: Insufficient documentation

## 2016-07-28 DIAGNOSIS — H1031 Unspecified acute conjunctivitis, right eye: Secondary | ICD-10-CM | POA: Diagnosis not present

## 2016-07-28 MED ORDER — ROLLATOR MISC: 1.0000 | Freq: Every day | 0 refills | Status: DC

## 2016-07-29 ENCOUNTER — Telehealth: Payer: Self-pay | Admitting: Family Medicine

## 2016-07-29 NOTE — Telephone Encounter (Signed)
Patient is aware that order has been sent twice for this and also Darlina Guys was a sent a message also. Jazmin Hartsell,CMA

## 2016-07-29 NOTE — Telephone Encounter (Signed)
Pt is calling because [facility] still has not received the orders for her to have a walker. She gave all the information to the nurse yesterday. She would like this sent or called over so that she can get it delivered. jw

## 2016-08-08 ENCOUNTER — Encounter: Payer: Self-pay | Admitting: Internal Medicine

## 2016-08-08 ENCOUNTER — Ambulatory Visit (INDEPENDENT_AMBULATORY_CARE_PROVIDER_SITE_OTHER): Payer: Medicare Other | Admitting: Internal Medicine

## 2016-08-08 VITALS — BP 106/64 | HR 63 | Ht 65.0 in | Wt 162.0 lb

## 2016-08-08 DIAGNOSIS — I5032 Chronic diastolic (congestive) heart failure: Secondary | ICD-10-CM

## 2016-08-08 DIAGNOSIS — Z9861 Coronary angioplasty status: Secondary | ICD-10-CM

## 2016-08-08 DIAGNOSIS — I2510 Atherosclerotic heart disease of native coronary artery without angina pectoris: Secondary | ICD-10-CM

## 2016-08-08 DIAGNOSIS — I480 Paroxysmal atrial fibrillation: Secondary | ICD-10-CM | POA: Diagnosis not present

## 2016-08-08 DIAGNOSIS — I495 Sick sinus syndrome: Secondary | ICD-10-CM | POA: Diagnosis not present

## 2016-08-08 DIAGNOSIS — Z950 Presence of cardiac pacemaker: Secondary | ICD-10-CM | POA: Diagnosis not present

## 2016-08-08 NOTE — Progress Notes (Signed)
HPI  [name] returns today for followup. She is a pleasant 83 yo woman with a h/o symptomatic tachy-brady syndrome, PAF, s/p PPM insertion and copd. She denies chest pain or sob. No syncope. Minimal peripheral edema. The patient was in the hospital a couple of months ago with sob and found to have respiratory failure due to COPD exacerbation. She is much improved. She notes that her ppm moves around in her chest.  Allergies  Allergen Reactions  . Baclofen Other (See Comments)    Confusion occurred with taking 3 at the same time.  . Codeine Phosphate Nausea Only  . Norco [Hydrocodone-Acetaminophen] Nausea And Vomiting  . Hydrochlorothiazide Other (See Comments)    Possible acute gout or arthritis of wrist  . Montelukast Sodium Other (See Comments)    Unspecified reaction.      Current Outpatient Prescriptions  Medication Sig Dispense Refill  . amiodarone (PACERONE) 200 MG tablet TAKE 1 TABLET(200 MG) BY MOUTH DAILY 90 tablet 3  . Ascorbic Acid (VITAMIN C WITH ROSE HIPS) 1000 MG tablet Take 1,000 mg by mouth daily.    Marland Kitchen aspirin EC 81 MG tablet Take 81 mg by mouth daily.    Marland Kitchen atorvastatin (LIPITOR) 20 MG tablet Take 1 tablet (20 mg total) by mouth daily. 30 tablet 11  . Biotin 5000 MCG TABS Take by mouth.    . calcium carbonate (OSCAL) 1500 (600 Ca) MG TABS tablet Take 600 mg of elemental calcium by mouth 2 (two) times daily with a meal.    . Cholecalciferol 10000 units TABS Take 1 tablet by mouth daily.     Marland Kitchen ELIQUIS 2.5 MG TABS tablet TAKE 1 TABLET BY MOUTH TWICE DAILY 60 tablet 5  . FIBER DIET TABS Take 2 tablets by mouth daily.    . fluticasone (FLONASE) 50 MCG/ACT nasal spray SHAKE LIQUID AND USE 2 SPRAYS IN EACH NOSTRIL DAILY 16 g 5  . Fluticasone-Umeclidin-Vilant (TRELEGY ELLIPTA) 100-62.5-25 MCG/INH AEPB Inhale 1 Dose into the lungs daily. 28 each 0  . furosemide (LASIX) 40 MG tablet Take 0.5 tablets (20 mg total) by mouth daily as needed for fluid or edema. 30 tablet  prn  . metoprolol succinate (TOPROL-XL) 25 MG 24 hr tablet Take 1 tablet (25 mg total) by mouth daily. 90 tablet 3  . Misc. Devices (ROLLATOR) MISC 1 Device by Does not apply route daily. 1 each 0  . nitroGLYCERIN (NITROSTAT) 0.4 MG SL tablet Place 1 tablet (0.4 mg total) under the tongue every 5 (five) minutes as needed for chest pain (x 3 tabs). 25 tablet PRN  . omeprazole (PRILOSEC) 20 MG capsule TAKE 1 CAPSULE(20 MG) BY MOUTH DAILY 90 capsule 3  . predniSONE (DELTASONE) 20 MG tablet Take 1 tablet (20 mg total) by mouth daily. Take for 5 days 10 tablet 1  . SENNOSIDES PO Take 2 tablets by mouth daily.    . traMADol (ULTRAM) 50 MG tablet TAKE 1 TABLET BY MOUTH EVERY 6 HOURS AS NEEDED FOR PAIN 30 tablet 5  . VENTOLIN HFA 108 (90 Base) MCG/ACT inhaler INHALE 2 PUFFS WITH SPACER EVERY 4 HOURS AS NEEDED FOR WHEEZING 18 g 5  . vitamin B-12 (CYANOCOBALAMIN) 1000 MCG tablet Take 1,000 mcg by mouth daily.     No current facility-administered medications for this visit.      Past Medical History:  Diagnosis Date  . Abnormal mammogram, unspecified 08/23/2010   Followup imaging reassuring.   . Acute appendicitis with rupture   .  ADENOMATOUS COLONIC POLYP 03/01/2003   Qualifier: Diagnosis of  By: McDiarmid MD, Sherren Mocha Multiple benign polyps of cecum, ascending, transverse and sigmoid colon by 8/09 colonoscopy by Dr Cristina Gong  Colonoscopy by Dr Cristina Gong for iron-deficiency anemia on 04/27/2010 showed three sessile polyps that were in ascending (3 mm x 9 mm), transverse (4 mm), and cecum (3 mm).  All three were tubular adenomas that were negative for high grade dysplasia or malignancy on pathology. Dr Cristina Gong called the polpys benign and not requiring follow-up in view of the patients age.    . Adrenal adenoma    Incidentaloma  . AF (paroxysmal atrial fibrillation) ([place]) 11/11/2010   Hospitalization (9/8-9/10, Dr Daneen Schick, III, Cardiology) for Paroxysmal Atrial Fibrillation with RVR and anginal pain secondary  to demand/supply mismatch in setting of RVR with known circumflex artery branch disease.    Marland Kitchen ANXIETY 04/27/2006   Qualifier: Diagnosis of  By: McDiarmid MD, Sherren Mocha    . Benign essential tremor 02/04/2016  . Candidiasis of the esophagus 11/26/2007  . CAP (community acquired pneumonia) 02/25/2015  . Cataract 2013   Bilateral   . Cellulitis of leg, right 10/01/2012  . Cellulitis of right lower extremity   . Chest pain with moderate risk of acute coronary syndrome 05/21/2015  . Chronic kidney disease (CKD), stage III (moderate)   . Concussion with loss of consciousness   . COPD 04/27/2006   Qualifier: Diagnosis of  By: McDiarmid MD, Sherren Mocha    . COPD exacerbation ([place])   . COPD, severe   . Decreased functional mobility and endurance 03/17/2015  . Diastolic heart failure ([place]) 07/30/2015  . DISC WITH RADICULOPATHY 04/27/2006   Qualifier: Diagnosis of  By: McDiarmid MD, Sherren Mocha    . EDEMA-LEGS,DUE TO VENOUS OBSTRUCT. 04/27/2006   Qualifier: Diagnosis of  By: McDiarmid MD, Sherren Mocha    . 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 Normal uric acid serum level at time of attack.    Marland Kitchen HERNIA, HIATAL, NONCONGENITAL 04/27/2006   Qualifier: Diagnosis of  By: McDiarmid MD, Sherren Mocha    . High risk medications (not anticoagulants) long-term use 03/05/2012  . History of Hemorrhoids 04/27/2006   Qualifier: Diagnosis of  By: McDiarmid MD, Sherren Mocha    . History of iron deficiency 01/16/2015  . Hx of colonoscopy with polypectomy 04/27/2010   Dr Cristina Gong found three  tubular adenomas each less than 10 mm size  . Hypertension   . Hypotension 07/09/2015  . Iron deficiency anemia 08/05/2010   Dr Cristina Gong (GI) has evaluated with EGD, colonoscopy, and video capsular endoscopy in 2011 & 2012.  All have been unrevealing as to an origin of IDA.  OV with Dr Cristina Gong (10/28/10) assessment of blood in stool per hemoccult and GER. Hbg 12.1 g/dL, MCV 91.8, Ferritin 30 ng/mL. Patient  taking on ferrous sulfate tab daily.   EGD on 03/06/12 by Dr Cristina Gong for IDA non-obstructing Schatzki's ring at Gastroesophageal junction, otherwise normal esophagus and stomach.     . Leg cramps 05/29/2012  . Lumbar herniated disc    History of HNP L4/5 in 2003  . Macular degeneration, bilateral 10/04/2010   Right eye is wet MD, the other is dry macular degeneration (ARMD). Pt undergoing some form of vascular endothelial growth factor inhibition intraocular therapy.    . Mild cognitive impairment 10/26/2012   (10/25/12) Failed MiniCog screen  . MUSCLE CRAMPS 03/11/2010   Qualifier: Diagnosis of  By:  McDiarmid MD, Sherren Mocha    . Muscle spasm of back 09/24/2013  . Myocardial infarct, old   . Numbness and tingling in hands 07/21/2011  . Pacemaker    MDT  . PREDIABETES 09/11/2007   Qualifier: Diagnosis of  By: McDiarmid MD, Sherren Mocha    . Retinal hemorrhage of left eye 06/2010  . RHINITIS, ALLERGIC 04/27/2006   Qualifier: Diagnosis of  By: McDiarmid MD, Sherren Mocha    . SCHATZKI'S RING, HX OF 11/26/2007   Qualifier: Diagnosis of  By: McDiarmid MD, Sherren Mocha  An EGD was performed by Dr Cristina Gong on 04/27/2010 for iron deficiency anemia. There was a a transient hiatal hernia with Schatzki's ring. Stomach and duodenum were normal. EGD on 03/06/12 by Dr Cristina Gong for IDA non-obstructing Schatzki's ring at Gastroesophageal junction, otherwise normal esophagus and stomach.    . Seborrheic keratosis, right anterior thigh 12/12/2013  . Shoulder pain, left 01/09/2014  . Sick sinus syndrome with tachycardia ([place])    MDT  . Soft tissue injury of foot 05/03/2011  . Solar lentigo 06/15/2012  . Solitary kidney, acquired 05/17/2010   Surgical removal for transitional cell cancer by Tresa Endo, MD ([place]). Surveillance cystoscopy by Dr Alinda Money [facility]) on 10/19/12 without evidence of cystoscopic recurrence. Recommend RTC one year for cystoscopy.   Marland Kitchen Spinal stenosis, lumbar   . Transitional cell carcinoma of ureter, history   . Urge  incontinence 12/13/2011   Diagnosed in 10/2011 by Dr Bjorn Loser (Urology)   . VENTRICULAR HYPERTROPHY, LEFT 08/28/2008   Qualifier: Diagnosis of  By: McDiarmid MD, Sherren Mocha    . VITAMIN B12 DEFICIENCY 10/07/2009   Qualifier: Diagnosis of  By: McDiarmid MD, Sherren Mocha  Dx based on a post-TKR anemia work-up Low normal serum B12 with high Methylmalonic acid and homocysteine level  Vit B12 serum level (10/28/10) > 1500 pg/mL   . Vitamin D deficiency 11/02/2010   Serum vitamin D 25(OH) = 10.9 ng/mL (30 -100) on 10/28/10 c/w Vitamin D deficiency.       ROS:   All systems reviewed and negative except as noted in the HPI.   Past Surgical History:  Procedure Laterality Date  . BREAST SURGERY     breast reduction  . CHOLECYSTECTOMY    . CORONARY ANGIOPLASTY WITH STENT PLACEMENT  2010   BMS RCA, OM2 occluded  . CYSTOSCOPY  09/30/2015   No cystoscopic evidence of uroepithelial neoplasm.  Follow up surveillance cystoscopy in one year  . DUPUYTREN CONTRACTURE RELEASE Right 05/22/2014   Procedure: DUPUYTREN RELEASE AND REPAIR AS NECESSARY RIGHT RING FINGER AND MIDDLE FINGER;  Surgeon: Roseanne Kaufman, MD;  Location: [place];  Service: Orthopedics;  Laterality: Right;  . ESOPHAGOGASTRODUODENOSCOPY  04/27/2010   Dr Cristina Gong - found transient H/H & Schatzki's ring  . ESOPHAGOGASTRODUODENOSCOPY ENDOSCOPY  03/06/2012   Dr Cristina Gong - found non-obstuctive Schatzki's ring at [employer] [facility] o/w normal EGD.   Marland Kitchen EYE SURGERY     bilateral cataracts  . KNEE ARTHROSCOPY W/ SYNOVECTOMY  11/2009, left knee   Dr Wynelle Link  . NEPHRECTOMY  For transition cell cancer    Dr Tresa Endo, surgeon  . PACEMAKER GENERATOR CHANGE N/A 11/12/2012   Procedure: PACEMAKER GENERATOR CHANGE;  Surgeon: Evans Lance, MD; Medtronic [facility] dual-chamber pacemaker serial number [id number]; Laterality: Right  . PACEMAKER INSERTION  2005   Dr Doreatha Lew  . PACEMAKER LEAD REMOVAL  2005   Removal and reinsertion of atrial and ventricular leads due to migration    . REPLACEMENT TOTAL KNEE  2009, Right  knee   Dr Wynelle Link  . REPLACEMENT TOTAL KNEE  05/2009, Left knee   Dr Wynelle Link  . TONSILLECTOMY    . TRIGGER FINGER RELEASE Right 05/22/2014   Procedure: RIGHT HAND A-1 PULLEY RELEASE ;  Surgeon: Roseanne Kaufman, MD;  Location: [place];  Service: Orthopedics;  Laterality: Right;     Family History  Problem Relation Age of Onset  . Dementia Mother   . Heart disease Father      Social History   Social History  . Marital status: Widowed    Spouse name: N/A  . Number of children: 4  . Years of education: N/A   Occupational History  . Futures trader Retired    Retired   Social History Main Topics  . Smoking status: Former Smoker    Packs/day: 1.00    Types: Cigarettes    Quit date: 03/02/2009  . Smokeless tobacco: Never Used  . Alcohol use 12.0 oz/week    14 Glasses of wine, 6 Standard drinks or equivalent per week     Comment: 4-5 glasses of wine per week  . Drug use: No  . Sexual activity: No   Other Topics Concern  . 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    Semi-retired Futures trader.     Pt has home nebulizer for inhalation therapies.   Former Smoker   Smoking Status:  quit > 5 years ago      (+) DNR status per discussion with Dr McDiarmid 10/25/12 and reiterated 06/20/13 office visit .   (+) Living Will/Advance Directive   (+) HC-POA: Cecile Sheerer Causey (pt's dgt)     BP 106/64   Pulse 63   Ht 5\' 5"  (1.651 m)   Wt 162 lb (73.5 kg)   SpO2 97%   BMI 26.96 kg/m   Physical Exam:  Well appearing elderly woman, NAD HEENT: Unremarkable Neck:  6 cm JVD, no thyromegally Back:  No CVA tenderness Lungs:  Clear with no wheezes, reduced breath sounds throughout. Well healed PPM incision. HEART:  Regular rate rhythm, no murmurs, no rubs, no  clicks Abd:  soft, positive bowel sounds, no organomegally, no rebound, no guarding Ext:  2 plus pulses, no edema, no cyanosis, no clubbing Skin:  No rashes no nodules Neuro:  CN II through XII intact, motor grossly intact   DEVICE  Normal device function.  See PaceArt for details.   Assess/Plan: 1. PPM - her Medtronic DDD PM is working normally. Will recheck in several months. 2. HTN - her blood pressure remains elevated. She states that at home it is not as high. 3. Atrial fib - she is maintaining NSR on amiodarone 200 mg daily. Will continue. I would like to reduce her dose but she has already had some break through atrial fib. 4. COPD - she remains on low dose prednisone. Will follow.  Mikle Bosworth.D.

## 2016-08-08 NOTE — Patient Instructions (Signed)
Medication Instructions:  Your physician recommends that you continue on your current medications as directed. Please refer to the Current Medication list given to you today.   Labwork: None Ordered   Testing/Procedures: None Ordered   Follow-Up: Your physician wants you to follow-up in: 1 year with Dr. Lovena Le. You will receive a reminder letter in the mail two months in advance. If you don't receive a letter, please call our office to schedule the follow-up appointment.  Remote monitoring is used to monitor your Pacemaker from home. This monitoring reduces the number of office visits required to check your device to one time per year. It allows [place] to keep an eye on the functioning of your device to ensure it is working properly. You are scheduled for a device check from home on  11/07/16 . You may send your transmission at any time that day. If you have a wireless device, the transmission will be sent automatically. After your physician reviews your transmission, you will receive a postcard with your next transmission date.    Any Other Special Instructions Will Be Listed Below (If Applicable).     If you need a refill on your cardiac medications before your next appointment, please call your pharmacy.

## 2016-08-09 LAB — CUP PACEART INCLINIC DEVICE CHECK
Battery Impedance: 303 Ohm
Battery Remaining Longevity: 107 mo
Battery Voltage: 2.79 V
Brady Statistic AP VS Percent: 96 %
Date Time Interrogation Session: 20180611195032
Implantable Lead Location: 753859
Implantable Lead Model: 5076
Implantable Lead Model: 5076
Implantable Pulse Generator Implant Date: 20140915
Lead Channel Pacing Threshold Amplitude: 0.5 V
Lead Channel Pacing Threshold Amplitude: 0.75 V
Lead Channel Pacing Threshold Amplitude: 0.75 V
Lead Channel Pacing Threshold Pulse Width: 0.4 ms
Lead Channel Pacing Threshold Pulse Width: 0.4 ms
Lead Channel Pacing Threshold Pulse Width: 0.4 ms
Lead Channel Sensing Intrinsic Amplitude: 2 mV
MDC IDC LEAD IMPLANT DT: 20050523
MDC IDC LEAD IMPLANT DT: 20050523
MDC IDC LEAD LOCATION: 753860
MDC IDC MSMT LEADCHNL RA IMPEDANCE VALUE: 534 Ohm
MDC IDC MSMT LEADCHNL RV IMPEDANCE VALUE: 852 Ohm
MDC IDC MSMT LEADCHNL RV PACING THRESHOLD AMPLITUDE: 0.625 V
MDC IDC MSMT LEADCHNL RV PACING THRESHOLD PULSEWIDTH: 0.4 ms
MDC IDC MSMT LEADCHNL RV SENSING INTR AMPL: 22.4 mV
MDC IDC SET LEADCHNL RA PACING AMPLITUDE: 1.625
MDC IDC SET LEADCHNL RV PACING AMPLITUDE: 2 V
MDC IDC SET LEADCHNL RV PACING PULSEWIDTH: 0.4 ms
MDC IDC SET LEADCHNL RV SENSING SENSITIVITY: 5.6 mV
MDC IDC STAT BRADY AP VP PERCENT: 2 %
MDC IDC STAT BRADY AS VP PERCENT: 0 %
MDC IDC STAT BRADY AS VS PERCENT: 2 %

## 2016-08-11 ENCOUNTER — Encounter: Payer: Self-pay | Admitting: Family Medicine

## 2016-08-11 ENCOUNTER — Ambulatory Visit (INDEPENDENT_AMBULATORY_CARE_PROVIDER_SITE_OTHER): Payer: Medicare Other | Admitting: Family Medicine

## 2016-08-11 VITALS — BP 92/60 | HR 95 | Temp 98.3°F | Wt 160.0 lb

## 2016-08-11 DIAGNOSIS — E559 Vitamin D deficiency, unspecified: Secondary | ICD-10-CM | POA: Diagnosis not present

## 2016-08-11 DIAGNOSIS — N184 Chronic kidney disease, stage 4 (severe): Secondary | ICD-10-CM

## 2016-08-11 DIAGNOSIS — J441 Chronic obstructive pulmonary disease with (acute) exacerbation: Secondary | ICD-10-CM | POA: Diagnosis not present

## 2016-08-11 DIAGNOSIS — J449 Chronic obstructive pulmonary disease, unspecified: Secondary | ICD-10-CM

## 2016-08-11 DIAGNOSIS — Z7901 Long term (current) use of anticoagulants: Secondary | ICD-10-CM | POA: Diagnosis not present

## 2016-08-11 DIAGNOSIS — I959 Hypotension, unspecified: Secondary | ICD-10-CM | POA: Diagnosis not present

## 2016-08-11 DIAGNOSIS — I2510 Atherosclerotic heart disease of native coronary artery without angina pectoris: Secondary | ICD-10-CM

## 2016-08-11 DIAGNOSIS — N2581 Secondary hyperparathyroidism of renal origin: Secondary | ICD-10-CM | POA: Diagnosis not present

## 2016-08-11 DIAGNOSIS — Z9861 Coronary angioplasty status: Secondary | ICD-10-CM

## 2016-08-11 DIAGNOSIS — E7800 Pure hypercholesterolemia, unspecified: Secondary | ICD-10-CM

## 2016-08-11 NOTE — Assessment & Plan Note (Addendum)
New problem Mildly symptomatic with unsteadiness with standing Multifactorial in origin: medications (amiodarone, metoprolol, lasix), decreased oral intake with COPD exacerbation.  Recommendation: Decrease metoprolol tartrate to 12.5 mg daily Stop Lasix Push oral fluid intake Slow transitions to standing.  1-point cane for balance.

## 2016-08-11 NOTE — Assessment & Plan Note (Signed)
New problem Mild exacerbation No further workup at this time Recommend completing self-initiated Z-pak Albuterol MDI 2 p q 6 hr prn sob/cough Continue Trelegy RTC if worseing SOB, cough

## 2016-08-11 NOTE — Progress Notes (Signed)
   Subjective:    Patient ID: [name], female    DOB: 11-29-1923, 83 y.o.   MRN: [id number] [name] is alone Sources of clinical information for visit is/are patient and past medical records. Nursing assessment for this office visit was reviewed with the patient for accuracy and revision.   HPI  Cough Onset: 1 week ago   Course not worseing   Severity: able to get out to do social activity  Worse with: nothing  Better with: Z-Pak day three   Symptoms Sputum:yes, yellow Fever: no  Shortness of breath:yes  Leg Swelling:no  Heart Burn or Reflux:no  Wheezing:no  Post Nasal Drip: no  Rhinorrhea: yes    Red Flags Weight Loss:  no Immunocompromised:  no PMH Cardiology: Grade Two Diastolic dysfunction by TTE 02/26/15 PMH Asthma or COPD: yes  PMH of Smoking: no  Using ACEIs: yes   Hypotension - BP 106/64 at Cardiology office yesterday - feeling unsteady with walking that is most prominent getting out of bed or car. - no syncope/vertigo - no falls - Taking 1/2 tab Lasix daily for ankle edema - no fever/chills/dysuria/urgency/frequenct - (+) cough (see above) - no melena, no BRBPR  SH: No smoking   Review of Systems See HPI    Objective:   Physical Exam VS reviewed GEN: Alert, Cooperative, Groomed, NAD HEENT: PERRL; EAC bilaterally not occluded, TM's translucent with normal LM, (+) LR;                No cervical LAN, No thyromegaly, No palpable masses COR: RRR, No M/G/R, No JVD, Normal PMI size and location LUNGS: distant, BCTA, No Acc mm use, speaking in full sentences EXT: No peripheral leg edema. Neuro: Oriented to person, place, and time;  Gait: Normal speed, No significant path deviation, Step through +,  Psych: Normal affect/thought/speech/language    Assessment & Plan:

## 2016-08-11 NOTE — Patient Instructions (Addendum)
Stop the Lasix (furosemide) for now.  It is lowering your blood pressure too much.  You may have to live with some swelling in your ankles in order to avoid the problems that come with low blood pressure,  like being unsteady with walking.   Take only half tablet of metoprolol a day to help increase your blood pressure.     Howell Rucks, RN, will call you with an appointment for Annual Wellness Visit.  You can then get that $25 card.   If you do not get better from your cold, let Dr Sanjit Mcmichael know.    Dr Riva Sesma will call you if your tests are not good. Otherwise he will send you a letter.  If you sign up for MyChart online, you will be able to see your test results once Dr Lord Lancour has reviewed them.  If you do not hear from [place] with in 2 weeks please call our office.

## 2016-08-12 ENCOUNTER — Telehealth: Payer: Self-pay | Admitting: Family Medicine

## 2016-08-12 LAB — BASIC METABOLIC PANEL
BUN / CREAT RATIO: 25 (ref 12–28)
BUN: 40 mg/dL — AB (ref 10–36)
CO2: 24 mmol/L (ref 20–29)
CREATININE: 1.63 mg/dL — AB (ref 0.57–1.00)
Calcium: 9.2 mg/dL (ref 8.7–10.3)
Chloride: 101 mmol/L (ref 96–106)
GFR calc Af Amer: 32 mL/min/{1.73_m2} — ABNORMAL LOW (ref >59)
GFR, EST NON AFRICAN AMERICAN: 28 mL/min/{1.73_m2} — AB (ref >59)
GLUCOSE: 95 mg/dL (ref 65–99)
Potassium: 4.4 mmol/L (ref 3.5–5.2)
Sodium: 141 mmol/L (ref 134–144)

## 2016-08-12 LAB — CBC
HEMATOCRIT: 36.9 % (ref 34.0–46.6)
Hemoglobin: 11.9 g/dL (ref 11.1–15.9)
MCH: 30.1 pg (ref 26.6–33.0)
MCHC: 32.2 g/dL (ref 31.5–35.7)
MCV: 93 fL (ref 79–97)
Platelets: 206 10*3/uL (ref 150–379)
RBC: 3.96 x10E6/uL (ref 3.77–5.28)
RDW: 14 % (ref 12.3–15.4)
WBC: 4.9 10*3/uL (ref 3.4–10.8)

## 2016-08-12 LAB — LIPID PANEL
CHOL/HDL RATIO: 3.5 ratio (ref 0.0–4.4)
Cholesterol, Total: 130 mg/dL (ref 100–199)
HDL: 37 mg/dL — AB (ref >39)
LDL CALC: 72 mg/dL (ref 0–99)
TRIGLYCERIDES: 106 mg/dL (ref 0–149)
VLDL Cholesterol Cal: 21 mg/dL (ref 5–40)

## 2016-08-12 LAB — VITAMIN D 25 HYDROXY (VIT D DEFICIENCY, FRACTURES): Vit D, 25-Hydroxy: 35.5 ng/mL (ref 30.0–100.0)

## 2016-08-12 NOTE — Telephone Encounter (Signed)
Scheduled AWV 09/08/16 - [name]

## 2016-08-12 NOTE — Telephone Encounter (Signed)
Left message on answering machine Informed Mrs Schwake about "drying out her kidneys too much with Lasix" Reiterated to stop using the Lasix for her ankle edema.

## 2016-08-14 IMAGING — CT CT MAXILLOFACIAL W/O CM
4 of 8 series · 15 of 47 positions shown, 17 images · non-contrast
Comparison: 07/30/2014 head CT.

CLINICAL DATA: Recent fall on [place] 4 days prior with facial
and neck ecchymosis. Anticoagulated on Eliquis. Altered mental
status.

EXAM:
CT HEAD WITHOUT CONTRAST
CT MAXILLOFACIAL WITHOUT CONTRAST
CT CERVICAL SPINE WITHOUT CONTRAST
TECHNIQUE: Multidetector CT imaging of the head, cervical spine, and
maxillofacial structures were performed using the standard protocol
without intravenous contrast. Multiplanar CT image reconstructions
of the cervical spine and maxillofacial structures were also
generated.

[Series 202: head w/o bone, idose (1) · axial · non-contrast · 0.49mm/px · z∈[+181,+208]mm · 2 of 64 slices shown]
[im 11/64  bone]
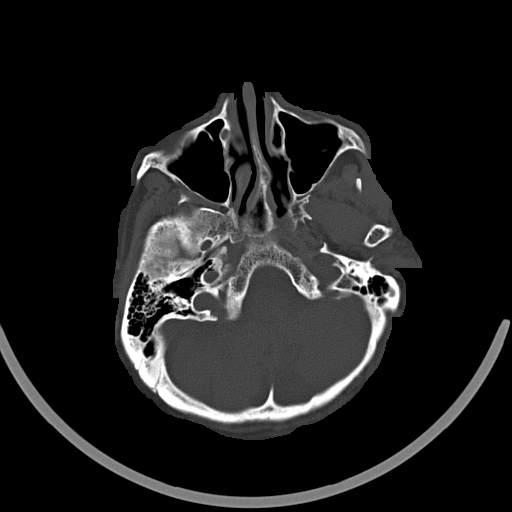
[im 22/64  bone]
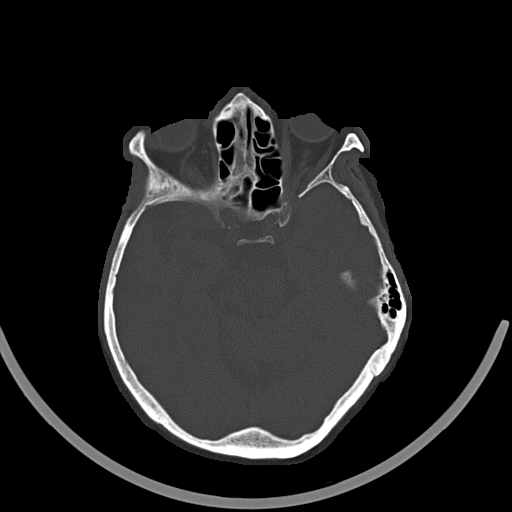

[Series 301: facial bones, idose (1) · axial · 0.36mm/px · z∈[+106,+238]mm · 8 of 86 slices shown, 10 images]
[im 10/86  brain]
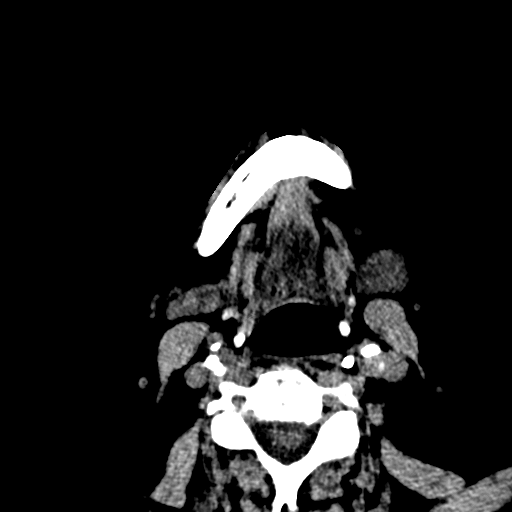
[im 10/86  bone]
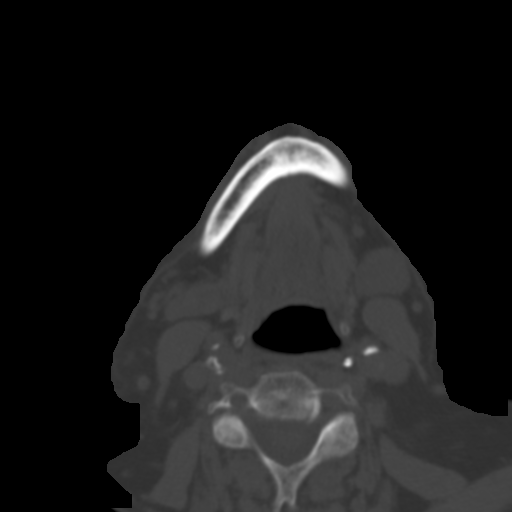
[im 19/86  bone]
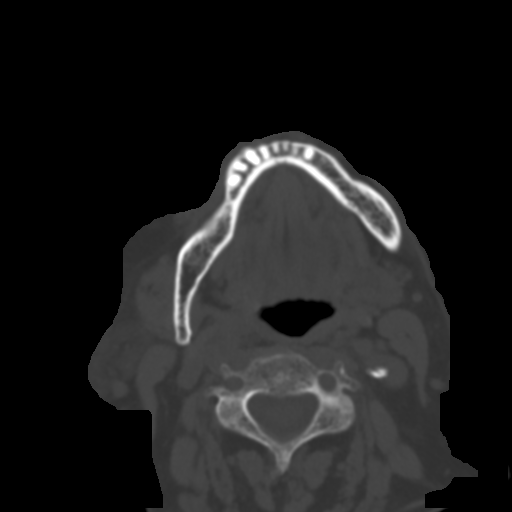
[im 29/86  bone]
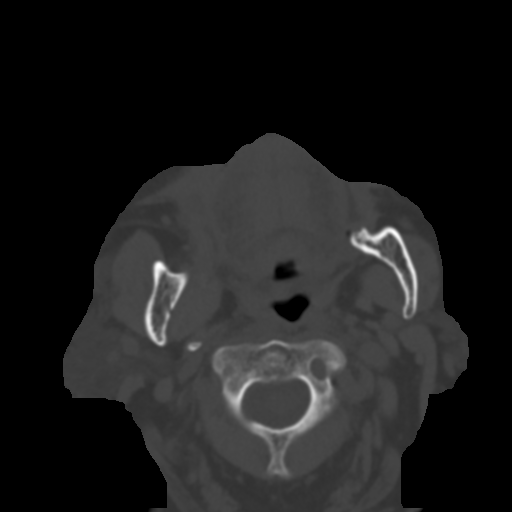
[im 38/86  bone]
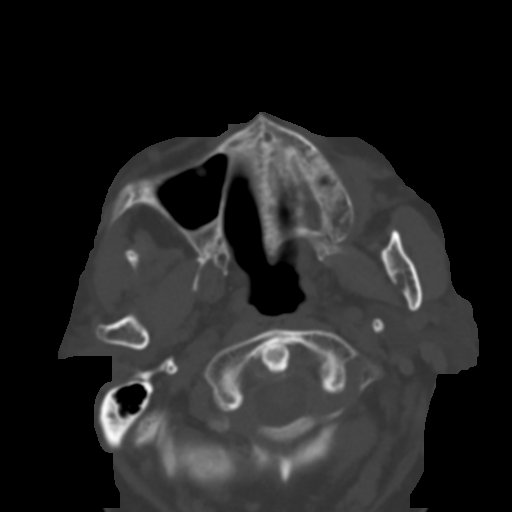
[im 48/86  brain]
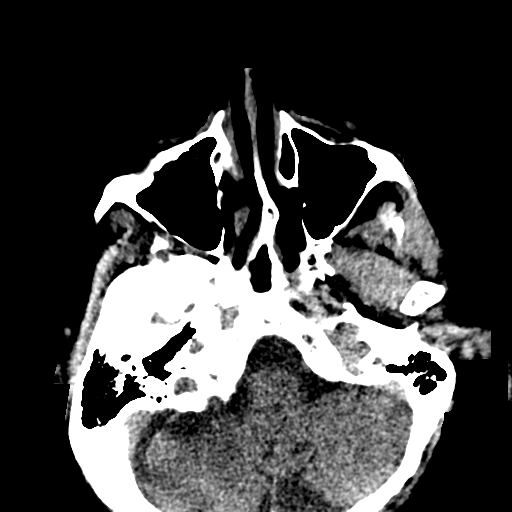
[im 48/86  bone]
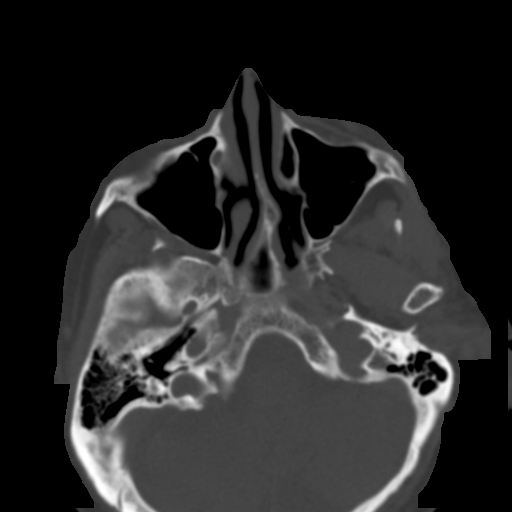
[im 57/86  bone]
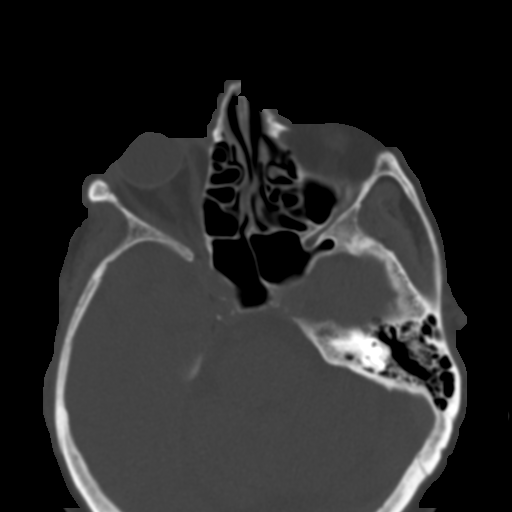
[im 67/86  bone]
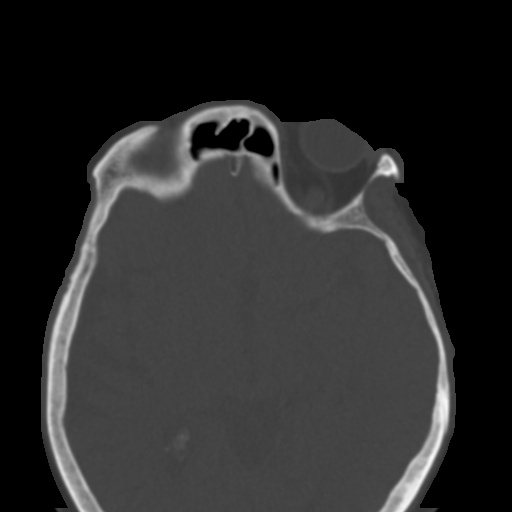
[im 76/86  bone]
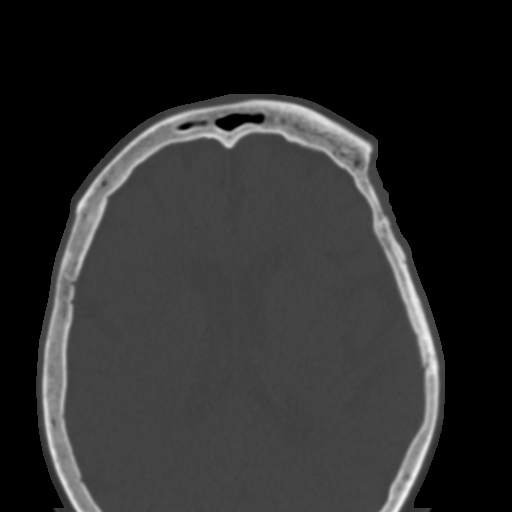

[Series 404: coronal, idose (2) · coronal · 0.33mm/px · 3 of 100 slices shown]
[im 25/100  bone]
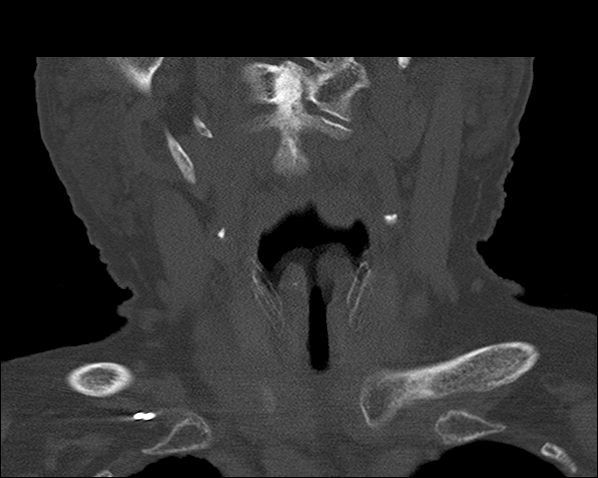
[im 50/100  bone]
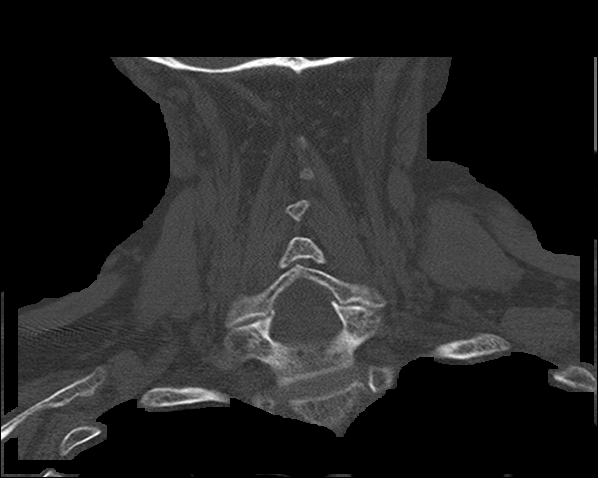
[im 75/100  bone]
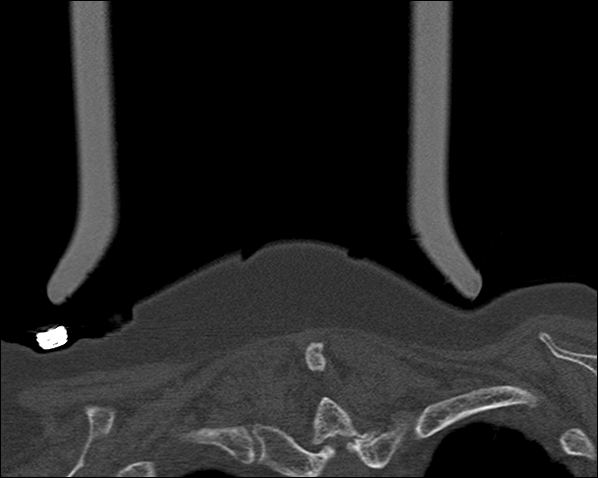

[Series 405: sagittal, idose (2) · sagittal · 0.33mm/px · 2 of 100 slices shown]
[im 34/100  bone]
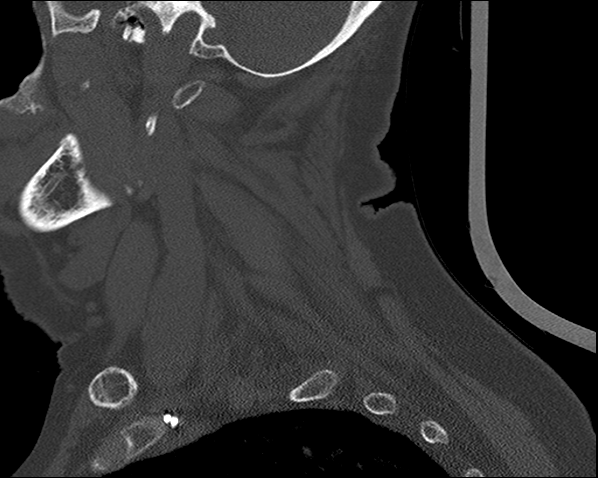
[im 67/100  bone]
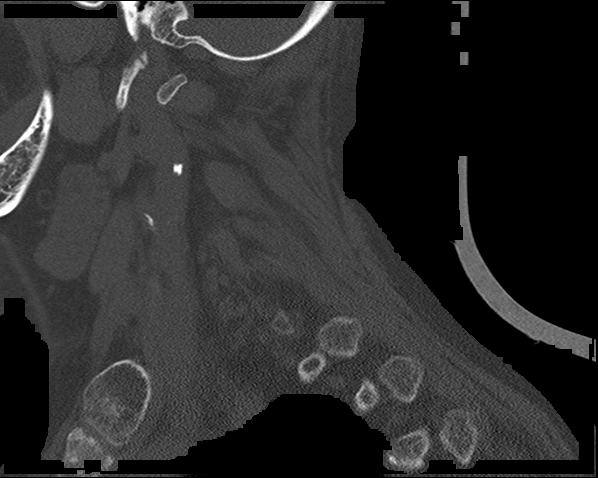

[15 of 47 positions shown; findings below may reference images not displayed]

FINDINGS: CT HEAD FINDINGS

Midline small moderate frontal scalp contusion. No evidence of
parenchymal hemorrhage or extra-axial fluid collection. No mass
lesion, mass effect, or midline shift.

No CT evidence of acute infarction. Intracranial atherosclerosis.
Nonspecific stable mild subcortical and periventricular white matter
hypodensity, most in keeping with chronic small vessel ischemic
change.

Generalized cerebral volume loss. No ventriculomegaly.

The visualized paranasal sinuses are essentially clear. The mastoid
air cells are unopacified. No evidence of calvarial fracture.

CT MAXILLOFACIAL FINDINGS

Small to moderate midline frontal scalp contusion. No maxillofacial
fracture. The maxilla and mandible appear intact. The nasal bones
are unremarkable in appearance. No dislocation at the
temporomandibular joints. No residual maxillary teeth. The
visualized mandibular dentition demonstrates no acute abnormality.
The orbits are intact bilaterally. The visualized paranasal sinuses
are essentially clear. The visualized mastoid air cells are
unopacified. No aggressive appearing focal osseous lesions.

The parapharyngeal fat planes are preserved. The nasopharynx,
oropharynx and hypopharynx are unremarkable in appearance. The
parotid and submandibular glands are within normal limits. No
cervical lymphadenopathy is seen.

CT CERVICAL SPINE FINDINGS

No fracture is detected in the cervical spine. No prevertebral soft
tissue swelling. Normal cervical lordosis. Dens is well positioned
between the lateral masses of C1. The lateral masses appear
well-aligned. Moderate degenerative disc disease in the mid to lower
cervical spine. Mild bilateral facet arthropathy. Mild foraminal
stenosis on the left at C5-6 No cervical spine subluxation.

Visualized mastoid air cells appear clear. No evidence of
intra-axial hemorrhage in the visualized brain. No gross cervical
canal hematoma. No significant pulmonary nodules at the visualized
lung apices. No cervical adenopathy or other significant neck soft
tissue abnormality.
IMPRESSION: 1. Small to moderate midline frontal scalp contusion. No evidence of
acute intracranial abnormality. No calvarial fracture.
2. Generalized cerebral volume loss and mild chronic small vessel
ischemia.
3. No maxillofacial fracture.
4. No fracture subluxation in the cervical spine. Moderate
degenerative changes in the cervical spine.

## 2016-08-15 IMAGING — CR DG CHEST 1V PORT
1 series · 1 of 1 positions shown · non-contrast
Comparison: 06/13/2015

CLINICAL DATA: Dyspnea. Some improvement after breathing treatment.

EXAM:
PORTABLE CHEST 1 VIEW

[AP]
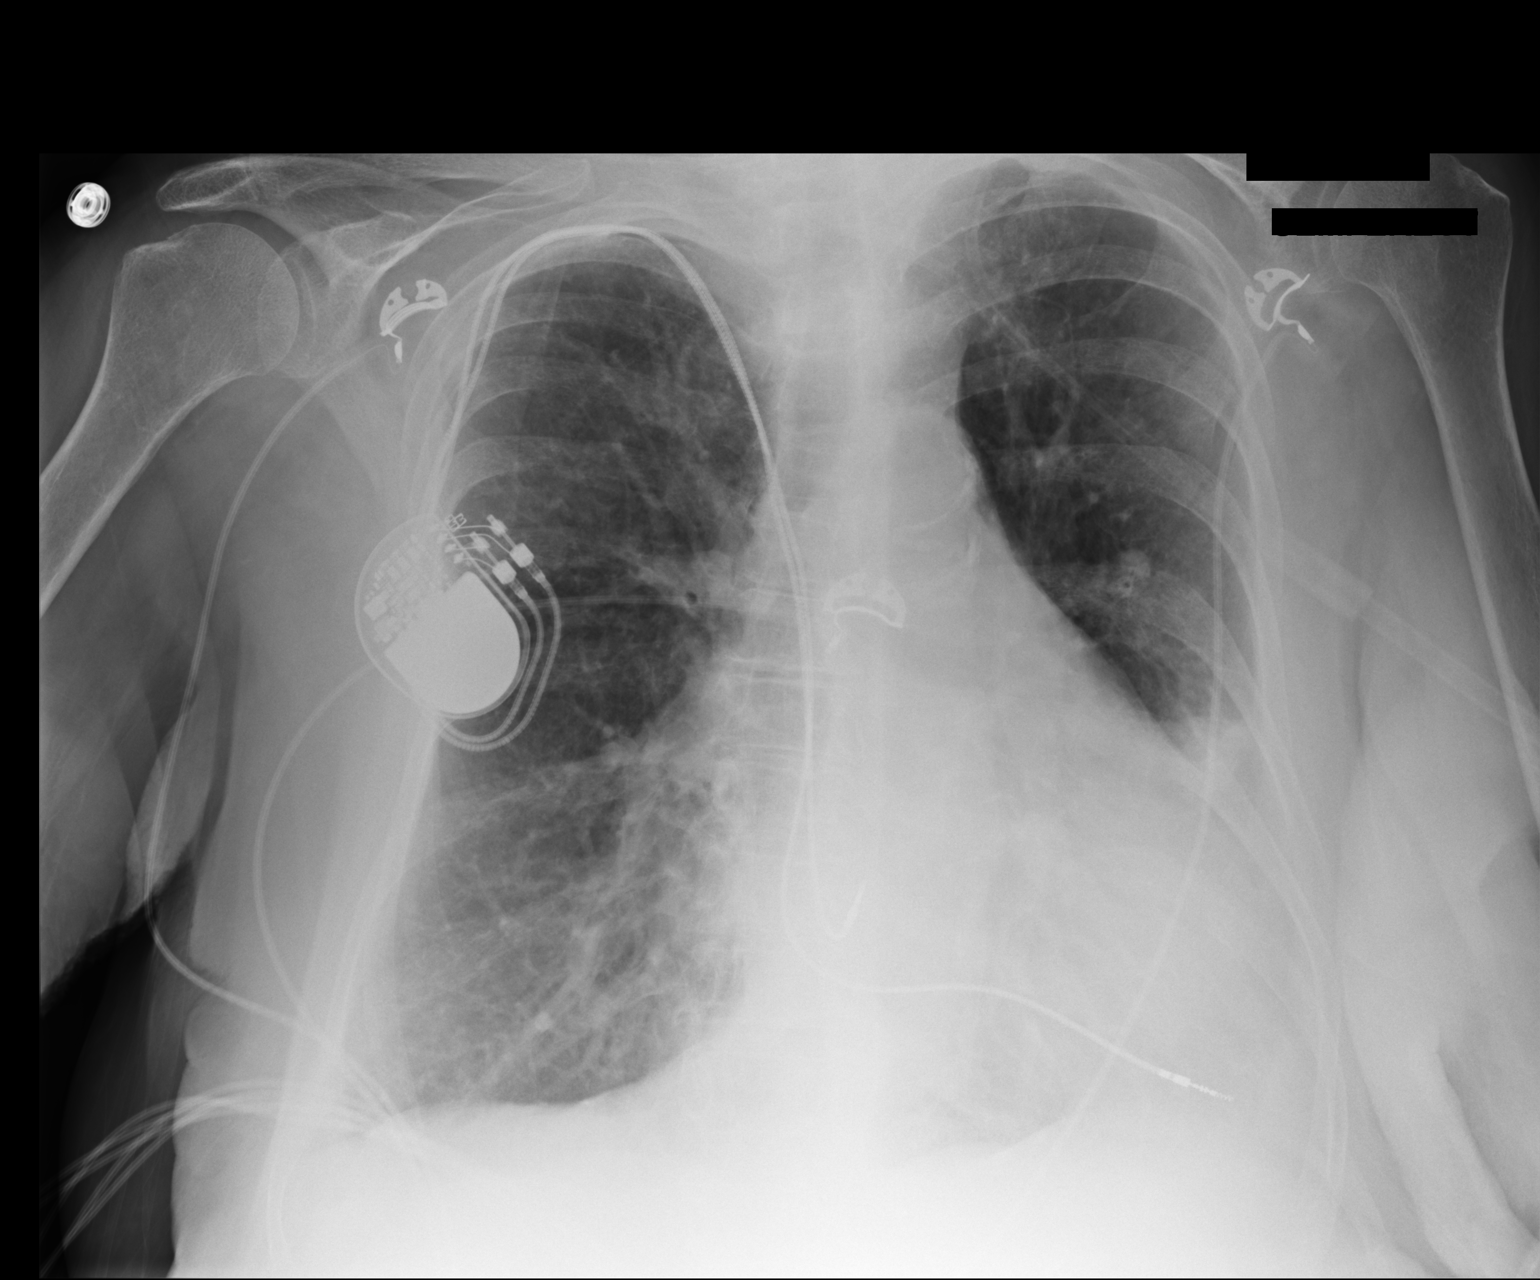

[1 of 1 positions shown; findings below may reference images not displayed]

FINDINGS: There is hyperinflation and marked cardiomegaly. This is unchanged.
There are intact appearances of the transvenous cardiac leads.
Linear and patchy opacities persist without significant interval
change. No confluent consolidation. No large effusion. Normal
pulmonary vasculature. No interval change from 06/13/2015 or
05/20/2015.
IMPRESSION: Unchanged cardiomegaly and hyperinflation. Linear and patchy
opacities are likely chronic, unchanged. No superimposed acute
findings.

## 2016-08-16 ENCOUNTER — Ambulatory Visit (HOSPITAL_COMMUNITY)
Admission: EM | Admit: 2016-08-16 | Discharge: 2016-08-16 | Disposition: A | Discharge status: Home / Self Care | Payer: Medicare Other | Attending: Family Medicine | Admitting: Family Medicine

## 2016-08-16 ENCOUNTER — Encounter (HOSPITAL_COMMUNITY): Payer: Self-pay | Admitting: Emergency Medicine

## 2016-08-16 ENCOUNTER — Ambulatory Visit (INDEPENDENT_AMBULATORY_CARE_PROVIDER_SITE_OTHER): Payer: Medicare Other

## 2016-08-16 DIAGNOSIS — J40 Bronchitis, not specified as acute or chronic: Secondary | ICD-10-CM | POA: Diagnosis not present

## 2016-08-16 DIAGNOSIS — R05 Cough: Secondary | ICD-10-CM | POA: Diagnosis not present

## 2016-08-16 MED ORDER — AMOXICILLIN-POT CLAVULANATE 875-125 MG PO TABS
1.0000 | ORAL_TABLET | Freq: Two times a day (BID) | ORAL | 0 refills | Status: DC
Start: 2016-08-16 — End: 2016-09-06

## 2016-08-16 MED ORDER — PREDNISONE 20 MG PO TABS: ORAL_TABLET | ORAL | 0 refills | Status: DC

## 2016-08-16 NOTE — Discharge Instructions (Signed)
X-ray shows COPD and bronchitis. We are going to change the antibiotic at this point and add prednisone to open up the airways. You should be feeling better in 24-48 hrs. if you are getting worse, go to the emergency department.

## 2016-08-16 NOTE — ED Provider Notes (Signed)
[place]    CSN: [id number] Arrival date & time: 08/16/16  8563     History   Chief Complaint Chief Complaint  Patient presents with  . Cough    HPI [name] is a 83 y.o. female.   The patient presented to the [facility] with a complaint of a cough and congestion. The patient reported starting a Z-Pac prescribed by her PCP last week that has not worked.  This a 83 year old woman who has a history of COPD. She's had pneumonia in the past. She describes a cough that is lasting all day long and started 10 days ago. She reports that the phlegm is yellow without any blood. She is not shortness of breath. She denies chest pain.      Past Medical History:  Diagnosis Date  . Abnormal mammogram, unspecified 08/23/2010   Followup imaging reassuring.   . Acute appendicitis with rupture   . ADENOMATOUS COLONIC POLYP 03/01/2003   Qualifier: Diagnosis of  By: McDiarmid MD, Sherren Mocha Multiple benign polyps of cecum, ascending, transverse and sigmoid colon by 8/09 colonoscopy by Dr Cristina Gong  Colonoscopy by Dr Cristina Gong for iron-deficiency anemia on 04/27/2010 showed three sessile polyps that were in ascending (3 mm x 9 mm), transverse (4 mm), and cecum (3 mm).  All three were tubular adenomas that were negative for high grade dysplasia or malignancy on pathology. Dr Cristina Gong called the polpys benign and not requiring follow-up in view of the patients age.    . Adrenal adenoma    Incidentaloma  . AF (paroxysmal atrial fibrillation) ([place]) 11/11/2010   Hospitalization (9/8-9/10, Dr Daneen Schick, III, Cardiology) for Paroxysmal Atrial Fibrillation with RVR and anginal pain secondary to demand/supply mismatch in setting of RVR with known circumflex artery branch disease.    Marland Kitchen ANXIETY 04/27/2006   Qualifier: Diagnosis of  By: McDiarmid MD, Sherren Mocha    . Benign essential tremor 02/04/2016  . Candidiasis of the esophagus 11/26/2007  . CAP (community acquired pneumonia) 02/25/2015  . Cataract 2013   Bilateral    . Cellulitis of leg, right 10/01/2012  . Cellulitis of right lower extremity   . Chest pain with moderate risk of acute coronary syndrome 05/21/2015  . Chronic kidney disease (CKD), stage III (moderate)   . Concussion with loss of consciousness   . COPD 04/27/2006   Qualifier: Diagnosis of  By: McDiarmid MD, Sherren Mocha    . COPD exacerbation ([place])   . COPD, severe   . Decreased functional mobility and endurance 03/17/2015  . Diastolic heart failure ([place]) 07/30/2015  . DISC WITH RADICULOPATHY 04/27/2006   Qualifier: Diagnosis of  By: McDiarmid MD, Sherren Mocha    . EDEMA-LEGS,DUE TO VENOUS OBSTRUCT. 04/27/2006   Qualifier: Diagnosis of  By: McDiarmid MD, Sherren Mocha    . 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 Normal uric acid serum level at time of attack.    Marland Kitchen HERNIA, HIATAL, NONCONGENITAL 04/27/2006   Qualifier: Diagnosis of  By: McDiarmid MD, Sherren Mocha    . High risk medications (not anticoagulants) long-term use 03/05/2012  . History of Hemorrhoids 04/27/2006   Qualifier: Diagnosis of  By: McDiarmid MD, Sherren Mocha    . History of iron deficiency 01/16/2015  . Hx of colonoscopy with polypectomy 04/27/2010   Dr Cristina Gong found three  tubular adenomas each less than 10 mm size  . Hypertension   . Hypotension 07/09/2015  . Iron deficiency anemia 08/05/2010  Dr Cristina Gong (GI) has evaluated with EGD, colonoscopy, and video capsular endoscopy in 2011 & 2012.  All have been unrevealing as to an origin of IDA.  OV with Dr Cristina Gong (10/28/10) assessment of blood in stool per hemoccult and GER. Hbg 12.1 g/dL, MCV 91.8, Ferritin 30 ng/mL. Patient taking on ferrous sulfate tab daily.   EGD on 03/06/12 by Dr Cristina Gong for IDA non-obstructing Schatzki's ring at Gastroesophageal junction, otherwise normal esophagus and stomach.     . Leg cramps 05/29/2012  . Lumbar herniated disc    History of HNP L4/5 in 2003  . Macular degeneration, bilateral 10/04/2010   Right eye is wet  MD, the other is dry macular degeneration (ARMD). Pt undergoing some form of vascular endothelial growth factor inhibition intraocular therapy.    . Mild cognitive impairment 10/26/2012   (10/25/12) Failed MiniCog screen  . MUSCLE CRAMPS 03/11/2010   Qualifier: Diagnosis of  By: McDiarmid MD, Sherren Mocha    . Muscle spasm of back 09/24/2013  . Myocardial infarct, old   . Numbness and tingling in hands 07/21/2011  . Pacemaker    MDT  . PREDIABETES 09/11/2007   Qualifier: Diagnosis of  By: McDiarmid MD, Sherren Mocha    . Retinal hemorrhage of left eye 06/2010  . RHINITIS, ALLERGIC 04/27/2006   Qualifier: Diagnosis of  By: McDiarmid MD, Sherren Mocha    . SCHATZKI'S RING, HX OF 11/26/2007   Qualifier: Diagnosis of  By: McDiarmid MD, Sherren Mocha  An EGD was performed by Dr Cristina Gong on 04/27/2010 for iron deficiency anemia. There was a a transient hiatal hernia with Schatzki's ring. Stomach and duodenum were normal. EGD on 03/06/12 by Dr Cristina Gong for IDA non-obstructing Schatzki's ring at Gastroesophageal junction, otherwise normal esophagus and stomach.    . Seborrheic keratosis, right anterior thigh 12/12/2013  . Shoulder pain, left 01/09/2014  . Sick sinus syndrome with tachycardia ([place])    MDT  . Soft tissue injury of foot 05/03/2011  . Solar lentigo 06/15/2012  . Solitary kidney, acquired 05/17/2010   Surgical removal for transitional cell cancer by Tresa Endo, MD ([place]). Surveillance cystoscopy by Dr Alinda Money [facility]) on 10/19/12 without evidence of cystoscopic recurrence. Recommend RTC one year for cystoscopy.   Marland Kitchen Spinal stenosis, lumbar   . Transitional cell carcinoma of ureter, history   . Urge incontinence 12/13/2011   Diagnosed in 10/2011 by Dr Bjorn Loser (Urology)   . VENTRICULAR HYPERTROPHY, LEFT 08/28/2008   Qualifier: Diagnosis of  By: McDiarmid MD, Sherren Mocha    . VITAMIN B12 DEFICIENCY 10/07/2009   Qualifier: Diagnosis of  By: McDiarmid MD, Sherren Mocha  Dx based on a post-TKR anemia work-up Low normal serum B12 with high  Methylmalonic acid and homocysteine level  Vit B12 serum level (10/28/10) > 1500 pg/mL   . Vitamin D deficiency 11/02/2010   Serum vitamin D 25(OH) = 10.9 ng/mL (30 -100) on 10/28/10 c/w Vitamin D deficiency.       Patient Active Problem List   Diagnosis Date Noted  . Abnormality of gait 07/28/2016  . Chronic diastolic CHF (congestive heart failure) ([place]) 11/08/2015  . GERD (gastroesophageal reflux disease) 11/08/2015  . COPD exacerbation ([place]) 11/08/2015  . Hearing loss 10/16/2015  . On amiodarone therapy 09/14/2015  . Hypotension 07/09/2015  . Paroxysmal atrial fibrillation ([facility])   . Pure hypercholesterolemia 07/31/2014  . CAD S/P RCA BMS, residual CFX disease 06/08/2014  . Pacemaker 04/02/2013  . Mild cognitive impairment 10/26/2012  . Chronic anticoagulation 10/01/2012  . Urge incontinence 12/13/2011  .  Vitamin D deficiency 11/02/2010  . Macular degeneration, bilateral 10/04/2010    Class: Chronic  . Solitary kidney, acquired 05/17/2010  . Spondylolisthesis of lumbar region   . VITAMIN B12 DEFICIENCY 10/07/2009  . Chronic kidney disease (CKD), stage IV (severe) ([place]) 09/02/2009  . MYOCARDIAL INFARCTION, HX OF 09/25/2008  . VENTRICULAR HYPERTROPHY, LEFT 08/28/2008  . At risk for diabetes mellitus 09/11/2007  . Anxiety disorder due to general medical condition with panic attack 04/27/2006  . SICK SINUS SYNDROME 04/27/2006  . Allergic rhinitis, seasonal 04/27/2006  . COPD, severe ([place]) 04/27/2006  . GASTROESOPHAGEAL REFLUX, NO ESOPHAGITIS 04/27/2006  . Osteoarthrosis involving lower leg 04/27/2006  . OSTEOARTHRITIS OF SPINE, NOS 04/27/2006  . LUMBAR SPINAL STENOSIS 04/27/2006    Past Surgical History:  Procedure Laterality Date  . BREAST SURGERY     breast reduction  . CHOLECYSTECTOMY    . CORONARY ANGIOPLASTY WITH STENT PLACEMENT  2010   BMS RCA, OM2 occluded  . CYSTOSCOPY  09/30/2015   No cystoscopic evidence of uroepithelial neoplasm.  Follow up surveillance  cystoscopy in one year  . DUPUYTREN CONTRACTURE RELEASE Right 05/22/2014   Procedure: DUPUYTREN RELEASE AND REPAIR AS NECESSARY RIGHT RING FINGER AND MIDDLE FINGER;  Surgeon: Roseanne Kaufman, MD;  Location: [place];  Service: Orthopedics;  Laterality: Right;  . ESOPHAGOGASTRODUODENOSCOPY  04/27/2010   Dr Cristina Gong - found transient H/H & Schatzki's ring  . ESOPHAGOGASTRODUODENOSCOPY ENDOSCOPY  03/06/2012   Dr Cristina Gong - found non-obstuctive Schatzki's ring at [employer] [facility] o/w normal EGD.   Marland Kitchen EYE SURGERY     bilateral cataracts  . KNEE ARTHROSCOPY W/ SYNOVECTOMY  11/2009, left knee   Dr Wynelle Link  . NEPHRECTOMY  For transition cell cancer    Dr Tresa Endo, surgeon  . PACEMAKER GENERATOR CHANGE N/A 11/12/2012   Procedure: PACEMAKER GENERATOR CHANGE;  Surgeon: Evans Lance, MD; [employer] [facility] dual-chamber pacemaker serial number [id number]; Laterality: Right  . PACEMAKER INSERTION  2005   Dr Doreatha Lew  . PACEMAKER LEAD REMOVAL  2005   Removal and reinsertion of atrial and ventricular leads due to migration  . REPLACEMENT TOTAL KNEE  2009, Right knee   Dr Wynelle Link  . REPLACEMENT TOTAL KNEE  05/2009, Left knee   Dr Wynelle Link  . TONSILLECTOMY    . TRIGGER FINGER RELEASE Right 05/22/2014   Procedure: RIGHT HAND A-1 PULLEY RELEASE ;  Surgeon: Roseanne Kaufman, MD;  Location: [place];  Service: Orthopedics;  Laterality: Right;    OB History    No data available       Home Medications    Prior to Admission medications   Medication Sig Start Date End Date Taking? Authorizing Provider  amiodarone (PACERONE) 200 MG tablet TAKE 1 TABLET(200 MG) BY MOUTH DAILY 03/01/16   Belva Crome, MD  amoxicillin-clavulanate (AUGMENTIN) 875-125 MG tablet Take 1 tablet by mouth every 12 (twelve) hours. 08/16/16   Robyn Haber, MD  Ascorbic Acid (VITAMIN C WITH ROSE HIPS) 1000 MG tablet Take 1,000 mg by mouth daily.    [provider]  aspirin EC 81 MG tablet Take 81 mg by mouth daily.    [provider]    atorvastatin (LIPITOR) 20 MG tablet Take 1 tablet (20 mg total) by mouth daily. 12/04/15   McDiarmid, Blane Ohara, MD  Biotin 5000 MCG TABS Take by mouth.    [provider]  calcium carbonate (OSCAL) 1500 (600 Ca) MG TABS tablet Take 600 mg of elemental calcium by mouth 2 (two) times daily with  a meal.    [provider]  Cholecalciferol 10000 units TABS Take 1 tablet by mouth daily.     [provider]  ELIQUIS 2.5 MG TABS tablet TAKE 1 TABLET BY MOUTH TWICE DAILY 07/01/16   McDiarmid, Blane Ohara, MD  FIBER DIET TABS Take 2 tablets by mouth daily.    [provider]  fluticasone (FLONASE) 50 MCG/ACT nasal spray SHAKE LIQUID AND USE 2 SPRAYS IN EACH NOSTRIL DAILY 05/16/16   Collene Gobble, MD  Fluticasone-Umeclidin-Vilant (TRELEGY ELLIPTA) 100-62.5-25 MCG/INH AEPB Inhale 1 Dose into the lungs daily. 06/23/16   Collene Gobble, MD  metoprolol succinate (TOPROL-XL) 25 MG 24 hr tablet Take 1 tablet (25 mg total) by mouth daily. 09/14/15   Belva Crome, MD  Misc. Devices (ROLLATOR) MISC 1 Device by Does not apply route daily. 07/28/16   McDiarmid, Blane Ohara, MD  nitroGLYCERIN (NITROSTAT) 0.4 MG SL tablet Place 1 tablet (0.4 mg total) under the tongue every 5 (five) minutes as needed for chest pain (x 3 tabs). 11/16/15   McDiarmid, Blane Ohara, MD  omeprazole (PRILOSEC) 20 MG capsule TAKE 1 CAPSULE(20 MG) BY MOUTH DAILY 02/24/16   McDiarmid, Blane Ohara, MD  predniSONE (DELTASONE) 20 MG tablet Two daily with food 08/16/16   Robyn Haber, MD  SENNOSIDES PO Take 2 tablets by mouth daily.    [provider]  traMADol (ULTRAM) 50 MG tablet TAKE 1 TABLET BY MOUTH EVERY 6 HOURS AS NEEDED FOR PAIN 02/24/16   McDiarmid, Blane Ohara, MD  VENTOLIN HFA 108 (90 Base) MCG/ACT inhaler INHALE 2 PUFFS WITH SPACER EVERY 4 HOURS AS NEEDED FOR WHEEZING 09/14/15   McDiarmid, Blane Ohara, MD  vitamin B-12 (CYANOCOBALAMIN) 1000 MCG tablet Take 1,000 mcg by mouth daily.    [provider]    Family  History Family History  Problem Relation Age of Onset  . Dementia Mother   . Heart disease Father     Social History Social History  Substance Use Topics  . Smoking status: Former Smoker    Packs/day: 1.00    Types: Cigarettes    Quit date: 03/02/2009  . Smokeless tobacco: Never Used  . Alcohol use 12.0 oz/week    14 Glasses of wine, 6 Standard drinks or equivalent per week     Comment: 4-5 glasses of wine per week     Allergies   Baclofen; Codeine phosphate; Norco [hydrocodone-acetaminophen]; Hydrochlorothiazide; and Montelukast sodium   Review of Systems Review of Systems  Respiratory: Positive for cough and wheezing.   All other systems reviewed and are negative.    Physical Exam Triage Vital Signs ED Triage Vitals  Enc Vitals Group     BP 08/16/16 1027 (!) 121/54     Pulse Rate 08/16/16 1027 83     Resp 08/16/16 1027 18     Temp 08/16/16 1027 98.4 F (36.9 C)     Temp Source 08/16/16 1027 Temporal     SpO2 08/16/16 1027 96 %     Weight --      Height --      Head Circumference --      Peak Flow --      Pain Score 08/16/16 1026 0     Pain Loc --      Pain Edu? --      Excl. in [place]? --    No data found.   Updated Vital Signs BP (!) 121/54 (BP Location: Right Arm)   Pulse 83  Temp 98.4 F (36.9 C) (Temporal)   Resp 18   SpO2 96%    Physical Exam  Constitutional: She is oriented to person, place, and time. She appears well-developed and well-nourished.  HENT:  Head: Normocephalic.  Right Ear: External ear normal.  Left Ear: External ear normal.  Mouth/Throat: Oropharynx is clear and moist.  Eyes: Conjunctivae and EOM are normal. Pupils are equal, round, and reactive to light.  Neck: Normal range of motion. Neck supple.  Cardiovascular: Normal rate, regular rhythm and normal heart sounds.   Pulmonary/Chest: She has wheezes. She has rales.  Some mild labored breathing with expiratory wheezes and inspiratory wheezes as well. She has rales at both  bases.  Musculoskeletal: Normal range of motion.  Neurological: She is alert and oriented to person, place, and time.  Skin: Skin is warm and dry.  Nursing note and vitals reviewed.    UC Treatments / Results  Labs (all labs ordered are listed, but only abnormal results are displayed) Labs Reviewed - No data to display  EKG  EKG Interpretation None       Radiology Dg Chest 2 View  Result Date: 08/16/2016 CLINICAL DATA:  Cough and wheezing for 10 days, COPD EXAM: CHEST  2 VIEW COMPARISON:  01/27/2016 chest radiograph. FINDINGS: Stable configuration of 2 lead right subclavian pacemaker. Surgical clips are noted in the posterior left upper abdomen. Stable cardiomediastinal silhouette with mild cardiomegaly and aortic atherosclerosis. No pneumothorax. No pleural effusion. Hyperinflated lungs. No overt pulmonary edema. Stable scarring in the mid to lower left lung. No acute consolidative airspace disease. IMPRESSION: 1. Hyperinflated lungs, compatible with the provided history of COPD . 2. Stable mid to lower left lung scarring. No acute consolidative airspace disease. 3. Stable mild cardiomegaly without overt pulmonary edema . Electronically Signed   By: Ilona Sorrel M.D.   On: 08/16/2016 11:02    Procedures Procedures (including critical care time)  Medications Ordered in UC Medications - No data to display   Initial Impression / Assessment and Plan / UC Course  I have reviewed the triage vital signs and the nursing notes.  Pertinent labs & imaging results that were available during my care of the patient were reviewed by me and considered in my medical decision making (see chart for details).     Final Clinical Impressions(s) / UC Diagnoses   Final diagnoses:  Bronchitis    New Prescriptions New Prescriptions   AMOXICILLIN-CLAVULANATE (AUGMENTIN) 875-125 MG TABLET    Take 1 tablet by mouth every 12 (twelve) hours.   PREDNISONE (DELTASONE) 20 MG TABLET    Two daily with  food     Robyn Haber, MD 08/16/16 1108

## 2016-08-16 NOTE — ED Triage Notes (Signed)
The patient presented to the [facility] with a complaint of a cough and congestion. The patient reported starting a Z-Pac prescribed by her PCP last week that has not worked.

## 2016-08-18 ENCOUNTER — Other Ambulatory Visit: Payer: Self-pay | Admitting: Family Medicine

## 2016-08-18 DIAGNOSIS — H26492 Other secondary cataract, left eye: Secondary | ICD-10-CM | POA: Diagnosis not present

## 2016-08-30 DIAGNOSIS — H353231 Exudative age-related macular degeneration, bilateral, with active choroidal neovascularization: Secondary | ICD-10-CM | POA: Diagnosis not present

## 2016-09-04 NOTE — Progress Notes (Signed)
Cardiology Office Note    Date:  09/06/2016   ID:  [name], DOB 1923/04/02, MRN [id number]  PCP:  [name], Tammy Ohara, MD  Cardiologist: Tammy Grooms, MD   Chief Complaint  Patient presents with  . Atrial Fibrillation    History of Present Illness:  [name] is a 83 y.o. female with previous coronary stent, paroxysmal atrial fibrillation, amiodarone therapy, chronic anticoagulation therapy, chronic diastolic heart failure, hypertension, and pacemaker for tachybradycardia syndrome.  83 years of age and has no cardiopulmonary complaints other than dyspnea with walking 50 yards. This is not significantly changed. She denies angina. She has not fallen or had head trauma. No bleeding on her current medical regimen. Recent laboratory data by her primary care reveals the thyroid and liver function and normal.  Past Medical History:  Diagnosis Date  . Abnormal mammogram, unspecified 08/23/2010   Followup imaging reassuring.   . Acute appendicitis with rupture   . ADENOMATOUS COLONIC POLYP 03/01/2003   Qualifier: Diagnosis of  By: McDiarmid MD, [name] Multiple benign polyps of cecum, ascending, transverse and sigmoid colon by 8/09 colonoscopy by Dr [name]  Colonoscopy by Dr [name] for iron-deficiency anemia on 04/27/2010 showed three sessile polyps that were in ascending (3 mm x 9 mm), transverse (4 mm), and cecum (3 mm).  All three were tubular adenomas that were negative for high grade dysplasia or malignancy on pathology. Dr [name] called the polpys benign and not requiring follow-up in view of the patients age.    . Adrenal adenoma    Incidentaloma  . AF (paroxysmal atrial fibrillation) ([place]) 11/11/2010   Hospitalization (9/8-9/10, Dr [name], III, Cardiology) for Paroxysmal Atrial Fibrillation with RVR and anginal pain secondary to demand/supply mismatch in setting of RVR with known circumflex artery branch disease.    Marland Kitchen ANXIETY 04/27/2006   Qualifier: Diagnosis of  By:  McDiarmid MD, [name]    . Benign essential tremor 02/04/2016  . Candidiasis of the esophagus 11/26/2007  . CAP (community acquired pneumonia) 02/25/2015  . Cataract 2013   Bilateral   . Cellulitis of leg, right 10/01/2012  . Cellulitis of right lower extremity   . Chest pain with moderate risk of acute coronary syndrome 05/21/2015  . Chronic kidney disease (CKD), stage III (moderate)   . Concussion with loss of consciousness   . COPD 04/27/2006   Qualifier: Diagnosis of  By: McDiarmid MD, [name]    . COPD exacerbation ([place])   . COPD, severe   . Decreased functional mobility and endurance 03/17/2015  . Diastolic heart failure ([place]) 07/30/2015  . DISC WITH RADICULOPATHY 04/27/2006   Qualifier: Diagnosis of  By: McDiarmid MD, [name]    . EDEMA-LEGS,DUE TO VENOUS OBSTRUCT. 04/27/2006   Qualifier: Diagnosis of  By: McDiarmid MD, [name]    . 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 Normal uric acid serum level at time of attack.    Marland Kitchen HERNIA, HIATAL, NONCONGENITAL 04/27/2006   Qualifier: Diagnosis of  By: McDiarmid MD, [name]    . High risk medications (not anticoagulants) long-term use 03/05/2012  . History of Hemorrhoids 04/27/2006   Qualifier: Diagnosis of  By: McDiarmid MD, [name]    . History of iron deficiency 01/16/2015  . Hx of colonoscopy with polypectomy 04/27/2010   Dr [name] found three  tubular adenomas each less than 10 mm size  . Hypertension   . Hypotension  07/09/2015  . Iron deficiency anemia 08/05/2010   Dr [name] (GI) has evaluated with EGD, colonoscopy, and video capsular endoscopy in 2011 & 2012.  All have been unrevealing as to an origin of IDA.  OV with Dr [name] (10/28/10) assessment of blood in stool per hemoccult and GER. Hbg 12.1 g/dL, MCV 91.8, Ferritin 30 ng/mL. Patient taking on ferrous sulfate tab daily.   EGD on 03/06/12 by Dr [name] for IDA non-obstructing Schatzki's ring at Gastroesophageal junction, otherwise  normal esophagus and stomach.     . Leg cramps 05/29/2012  . Lumbar herniated disc    History of HNP L4/5 in 2003  . Macular degeneration, bilateral 10/04/2010   Right eye is wet MD, the other is dry macular degeneration (ARMD). Pt undergoing some form of vascular endothelial growth factor inhibition intraocular therapy.    . Mild cognitive impairment 10/26/2012   (10/25/12) Failed MiniCog screen  . MUSCLE CRAMPS 03/11/2010   Qualifier: Diagnosis of  By: McDiarmid MD, [name]    . Muscle spasm of back 09/24/2013  . Myocardial infarct, old   . Numbness and tingling in hands 07/21/2011  . Pacemaker    MDT  . PREDIABETES 09/11/2007   Qualifier: Diagnosis of  By: McDiarmid MD, [name]    . Retinal hemorrhage of left eye 06/2010  . RHINITIS, ALLERGIC 04/27/2006   Qualifier: Diagnosis of  By: McDiarmid MD, [name]    . SCHATZKI'S RING, HX OF 11/26/2007   Qualifier: Diagnosis of  By: McDiarmid MD, [name]  An EGD was performed by Dr [name] on 04/27/2010 for iron deficiency anemia. There was a a transient hiatal hernia with Schatzki's ring. Stomach and duodenum were normal. EGD on 03/06/12 by Dr [name] for IDA non-obstructing Schatzki's ring at Gastroesophageal junction, otherwise normal esophagus and stomach.    . Seborrheic keratosis, right anterior thigh 12/12/2013  . Shoulder pain, left 01/09/2014  . Sick sinus syndrome with tachycardia ([place])    MDT  . Soft tissue injury of foot 05/03/2011  . Solar lentigo 06/15/2012  . Solitary kidney, acquired 05/17/2010   Surgical removal for transitional cell cancer by Tammy Endo, MD ([facility]). Surveillance cystoscopy by Dr [name] Urology) on 10/19/12 without evidence of cystoscopic recurrence. Recommend RTC one year for cystoscopy.   Marland Kitchen Spinal stenosis, lumbar   . Transitional cell carcinoma of ureter, history   . Urge incontinence 12/13/2011   Diagnosed in 10/2011 by Dr [name] (Urology)   . VENTRICULAR HYPERTROPHY, LEFT 08/28/2008   Qualifier: Diagnosis of   By: McDiarmid MD, [name]    . VITAMIN B12 DEFICIENCY 10/07/2009   Qualifier: Diagnosis of  By: McDiarmid MD, [name]  Dx based on a post-TKR anemia work-up Low normal serum B12 with high Methylmalonic acid and homocysteine level  Vit B12 serum level (10/28/10) > 1500 pg/mL   . Vitamin D deficiency 11/02/2010   Serum vitamin D 25(OH) = 10.9 ng/mL (30 -100) on 10/28/10 c/w Vitamin D deficiency.       Past Surgical History:  Procedure Laterality Date  . BREAST SURGERY     breast reduction  . CHOLECYSTECTOMY    . CORONARY ANGIOPLASTY WITH STENT PLACEMENT  2010   BMS RCA, OM2 occluded  . CYSTOSCOPY  09/30/2015   No cystoscopic evidence of uroepithelial neoplasm.  Follow up surveillance cystoscopy in one year  . DUPUYTREN CONTRACTURE RELEASE Right 05/22/2014   Procedure: DUPUYTREN RELEASE AND REPAIR AS NECESSARY RIGHT RING FINGER AND MIDDLE FINGER;  Surgeon: Roseanne Kaufman, MD;  Location: [facility]  OR;  Service: Orthopedics;  Laterality: Right;  . ESOPHAGOGASTRODUODENOSCOPY  04/27/2010   Dr [name] - found transient H/H & Schatzki's ring  . ESOPHAGOGASTRODUODENOSCOPY ENDOSCOPY  03/06/2012   Dr [name] - found non-obstuctive Schatzki's ring at [employer] [facility]. o/w normal EGD.   Marland Kitchen EYE SURGERY     bilateral cataracts  . KNEE ARTHROSCOPY W/ SYNOVECTOMY  11/2009, left knee   Dr Wynelle Link  . NEPHRECTOMY  For transition cell cancer    Dr [name], surgeon  . PACEMAKER GENERATOR CHANGE N/A 11/12/2012   Procedure: PACEMAKER GENERATOR CHANGE;  Surgeon: Evans Lance, MD; [facility] dual-chamber pacemaker serial number [id number]; Laterality: Right  . PACEMAKER INSERTION  2005   Dr Doreatha Lew  . PACEMAKER LEAD REMOVAL  2005   Removal and reinsertion of atrial and ventricular leads due to migration  . REPLACEMENT TOTAL KNEE  2009, Right knee   Dr Wynelle Link  . REPLACEMENT TOTAL KNEE  05/2009, Left knee   Dr Wynelle Link  . TONSILLECTOMY    . TRIGGER FINGER RELEASE Right 05/22/2014   Procedure: RIGHT HAND A-1 PULLEY RELEASE ;   Surgeon: Roseanne Kaufman, MD;  Location: [place];  Service: Orthopedics;  Laterality: Right;    Current Medications: Outpatient Medications Prior to Visit  Medication Sig Dispense Refill  . amiodarone (PACERONE) 200 MG tablet TAKE 1 TABLET(200 MG) BY MOUTH DAILY 90 tablet 3  . Ascorbic Acid (VITAMIN C WITH ROSE HIPS) 1000 MG tablet Take 1,000 mg by mouth daily.    Marland Kitchen aspirin EC 81 MG tablet Take 81 mg by mouth daily.    Marland Kitchen atorvastatin (LIPITOR) 20 MG tablet Take 1 tablet (20 mg total) by mouth daily. 30 tablet 11  . Biotin 5000 MCG TABS Take by mouth.    . calcium carbonate (OSCAL) 1500 (600 Ca) MG TABS tablet Take 600 mg of elemental calcium by mouth 2 (two) times daily with a meal.    . Cholecalciferol 10000 units TABS Take 1 tablet by mouth daily.     Marland Kitchen ELIQUIS 2.5 MG TABS tablet TAKE 1 TABLET BY MOUTH TWICE DAILY 60 tablet 5  . FIBER DIET TABS Take 2 tablets by mouth daily.    . fluticasone (FLONASE) 50 MCG/ACT nasal spray SHAKE LIQUID AND USE 2 SPRAYS IN EACH NOSTRIL DAILY 16 g 5  . Fluticasone-Umeclidin-Vilant (TRELEGY ELLIPTA) 100-62.5-25 MCG/INH AEPB Inhale 1 Dose into the lungs daily. 28 each 0  . metoprolol succinate (TOPROL-XL) 25 MG 24 hr tablet Take 1 tablet (25 mg total) by mouth daily. 90 tablet 3  . Misc. Devices (ROLLATOR) MISC 1 Device by Does not apply route daily. 1 each 0  . nitroGLYCERIN (NITROSTAT) 0.4 MG SL tablet Place 1 tablet (0.4 mg total) under the tongue every 5 (five) minutes as needed for chest pain (x 3 tabs). 25 tablet PRN  . omeprazole (PRILOSEC) 20 MG capsule TAKE 1 CAPSULE(20 MG) BY MOUTH DAILY 90 capsule 3  . predniSONE (DELTASONE) 20 MG tablet Two daily with food 10 tablet 0  . SENNOSIDES PO Take 2 tablets by mouth daily.    . traMADol (ULTRAM) 50 MG tablet TAKE 1 TABLET BY MOUTH EVERY 6 HOURS AS NEEDED FOR PAIN 30 tablet 5  . VENTOLIN HFA 108 (90 Base) MCG/ACT inhaler INHALE 2 PUFFS WITH SPACER EVERY 4 HOURS AS NEEDED FOR WHEEZING 18 g 5  . vitamin B-12  (CYANOCOBALAMIN) 1000 MCG tablet Take 1,000 mcg by mouth daily.    Marland Kitchen amoxicillin-clavulanate (AUGMENTIN) 875-125 MG tablet Take 1  tablet by mouth every 12 (twelve) hours. (Patient not taking: Reported on 09/06/2016) 14 tablet 0   No facility-administered medications prior to visit.      Allergies:   Baclofen; Codeine phosphate; Norco [hydrocodone-acetaminophen]; Hydrochlorothiazide; and Montelukast sodium   Social History   Social History  . Marital status: Widowed    Spouse name: N/A  . Number of children: 4  . Years of education: N/A   Occupational History  . Futures trader Retired    Retired   Social History Main Topics  . Smoking status: Former Smoker    Packs/day: 1.00    Types: Cigarettes    Quit date: 03/02/2009  . Smokeless tobacco: Never Used  . Alcohol use 12.0 oz/week    14 Glasses of wine, 6 Standard drinks or equivalent per week     Comment: 4-5 glasses of wine per week  . Drug use: No  . Sexual activity: No   Other Topics Concern  . 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    Semi-retired Futures trader.     Pt has home nebulizer for inhalation therapies.   Former Smoker   Smoking Status:  quit > 5 years ago      (+) DNR status per discussion with Dr [name] 10/25/12 and reiterated 06/20/13 office visit .   (+) Living Will/Advance Directive   (+) HC-POA: Cecile Sheerer Causey (pt's dgt)     Family History:  The patient's family history includes Dementia in her mother; Heart disease in her father.   ROS:   Please see the history of present illness.    Dyspnea on exertion. Musculoskeletal discomfort. Some level of forgetfulness but still independent.  All other systems reviewed and are negative.   PHYSICAL EXAM:   VS:  BP 116/74 (BP Location: Left Arm)   Pulse 91   Ht 5\' 5"   (1.651 m)   Wt 167 lb (75.8 kg)   BMI 27.79 kg/m    GEN: Well nourished, well developed, in no acute distress . Appears gentleman stated age. HEENT: normal  Neck: no JVD, carotid bruits, or masses Cardiac: RRR; no murmurs, rubs, or gallops,no edema  Respiratory:  clear to auscultation bilaterally, normal work of breathing GI: soft, nontender, nondistended, + BS MS: no deformity or atrophy  Skin: warm and dry, no rash Neuro:  Alert and Oriented x 3, Strength and sensation are intact Psych: euthymic mood, full affect  Wt Readings from Last 3 Encounters:  09/06/16 167 lb (75.8 kg)  08/11/16 160 lb (72.6 kg)  08/08/16 162 lb (73.5 kg)      Studies/Labs Reviewed:   EKG:  EKG  Atrial pacing and 91 bpm. Personally reviewed.  Recent Labs: 01/07/2016: Magnesium 2.0 04/28/2016: ALT 10; Brain Natriuretic Peptide 384.6; TSH 3.12 08/11/2016: BUN 40; Creatinine, Ser 1.63; Hemoglobin 11.9; Platelets 206; Potassium 4.4; Sodium 141   Lipid Panel    Component Value Date/Time   CHOL 130 08/11/2016 1101   TRIG 106 08/11/2016 1101   HDL 37 (L) 08/11/2016 1101   CHOLHDL 3.5 08/11/2016 1101   CHOLHDL 3.4 07/30/2015 1231   VLDL 29 07/30/2015 1231   LDLCALC 72 08/11/2016 1101   LDLDIRECT 74 05/08/2014 1110    Additional studies/ records that were reviewed today include:  Reviewed recent laboratory  data by Dr. Wendy Poet where TSH was 3.12 in March and liver panel was normal.    ASSESSMENT:    1. Paroxysmal atrial fibrillation (HCC)   2. Chronic diastolic CHF (congestive heart failure) ([place])   3. On amiodarone therapy   4. Chronic anticoagulation   5. CAD S/P RCA BMS, residual CFX disease   6. SICK SINUS SYNDROME   7. Chronic kidney disease (CKD), stage IV (severe) ([place])   8. Pacemaker      PLAN:  In order of problems listed above:  1. Sinus rhythm and is being maintained with low dose amiodarone. In atrial fibrillation she develops acute diastolic heart failure. 2. No symptoms or  evidence of volume overload. 3. No obvious evidence of toxicity to amiodarone. Plan TSH and hepatic panel on return visit in 6 months. 4. No bleeding complications on anticoagulation. 5. Asymptomatic 6. Treated with pacemaker  Overall doing well. No restrictions. Call if dyspnea or fluid retention. Six-month follow-up with TSH and hepatic panel    Medication Adjustments/Labs and Tests Ordered: Current medicines are reviewed at length with the patient today.  Concerns regarding medicines are outlined above.  Medication changes, Labs and Tests ordered today are listed in the Patient Instructions below. Patient Instructions  Medication Instructions:  None  Labwork: None  Testing/Procedures: None  Follow-Up: Your physician wants you to follow-up in: 6 months with Dr. [name].  You will receive a reminder letter in the mail two months in advance. If you don't receive a letter, please call our office to schedule the follow-up appointment.   Any Other Special Instructions Will Be Listed Below (If Applicable).     If you need a refill on your cardiac medications before your next appointment, please call your pharmacy.      Signed, Tammy Grooms, MD  09/06/2016 9:38 AM    [facility] [address], [place], [place]  [address] Phone: [phone]; Fax: [phone]

## 2016-09-06 ENCOUNTER — Ambulatory Visit (INDEPENDENT_AMBULATORY_CARE_PROVIDER_SITE_OTHER): Payer: Medicare Other | Admitting: Interventional Cardiology

## 2016-09-06 ENCOUNTER — Encounter: Payer: Self-pay | Admitting: Interventional Cardiology

## 2016-09-06 VITALS — BP 116/74 | HR 91 | Ht 65.0 in | Wt 167.0 lb

## 2016-09-06 DIAGNOSIS — Z9861 Coronary angioplasty status: Secondary | ICD-10-CM | POA: Diagnosis not present

## 2016-09-06 DIAGNOSIS — N184 Chronic kidney disease, stage 4 (severe): Secondary | ICD-10-CM | POA: Diagnosis not present

## 2016-09-06 DIAGNOSIS — I2510 Atherosclerotic heart disease of native coronary artery without angina pectoris: Secondary | ICD-10-CM | POA: Diagnosis not present

## 2016-09-06 DIAGNOSIS — Z7901 Long term (current) use of anticoagulants: Secondary | ICD-10-CM | POA: Diagnosis not present

## 2016-09-06 DIAGNOSIS — I5032 Chronic diastolic (congestive) heart failure: Secondary | ICD-10-CM

## 2016-09-06 DIAGNOSIS — I495 Sick sinus syndrome: Secondary | ICD-10-CM

## 2016-09-06 DIAGNOSIS — Z950 Presence of cardiac pacemaker: Secondary | ICD-10-CM

## 2016-09-06 DIAGNOSIS — Z79899 Other long term (current) drug therapy: Secondary | ICD-10-CM | POA: Diagnosis not present

## 2016-09-06 DIAGNOSIS — I480 Paroxysmal atrial fibrillation: Secondary | ICD-10-CM | POA: Diagnosis not present

## 2016-09-06 NOTE — Patient Instructions (Signed)
Medication Instructions:  None  Labwork: None  Testing/Procedures: None  Follow-Up: Your physician wants you to follow-up in: 6 months with Dr. Smith.  You will receive a reminder letter in the mail two months in advance. If you don't receive a letter, please call our office to schedule the follow-up appointment.   Any Other Special Instructions Will Be Listed Below (If Applicable).     If you need a refill on your cardiac medications before your next appointment, please call your pharmacy.   

## 2016-09-07 DIAGNOSIS — H353221 Exudative age-related macular degeneration, left eye, with active choroidal neovascularization: Secondary | ICD-10-CM | POA: Diagnosis not present

## 2016-09-07 DIAGNOSIS — H353211 Exudative age-related macular degeneration, right eye, with active choroidal neovascularization: Secondary | ICD-10-CM | POA: Diagnosis not present

## 2016-09-08 ENCOUNTER — Ambulatory Visit: Payer: Medicare Other | Admitting: *Deleted

## 2016-09-15 ENCOUNTER — Ambulatory Visit: Payer: Medicare Other | Admitting: Family Medicine

## 2016-09-22 ENCOUNTER — Encounter: Payer: Self-pay | Admitting: *Deleted

## 2016-09-22 ENCOUNTER — Ambulatory Visit (INDEPENDENT_AMBULATORY_CARE_PROVIDER_SITE_OTHER): Payer: Medicare Other | Admitting: *Deleted

## 2016-09-22 ENCOUNTER — Encounter: Payer: Self-pay | Admitting: Family Medicine

## 2016-09-22 ENCOUNTER — Ambulatory Visit (INDEPENDENT_AMBULATORY_CARE_PROVIDER_SITE_OTHER): Payer: Medicare Other | Admitting: Family Medicine

## 2016-09-22 VITALS — BP 122/78 | HR 104 | Temp 97.9°F | Ht 65.0 in | Wt 170.0 lb

## 2016-09-22 VITALS — Ht 65.0 in | Wt 170.0 lb

## 2016-09-22 DIAGNOSIS — I2510 Atherosclerotic heart disease of native coronary artery without angina pectoris: Secondary | ICD-10-CM

## 2016-09-22 DIAGNOSIS — N184 Chronic kidney disease, stage 4 (severe): Secondary | ICD-10-CM

## 2016-09-22 DIAGNOSIS — Z0000 Encounter for general adult medical examination without abnormal findings: Secondary | ICD-10-CM

## 2016-09-22 DIAGNOSIS — Z9861 Coronary angioplasty status: Secondary | ICD-10-CM

## 2016-09-22 DIAGNOSIS — J449 Chronic obstructive pulmonary disease, unspecified: Secondary | ICD-10-CM | POA: Diagnosis not present

## 2016-09-22 NOTE — Patient Instructions (Signed)
[name],  Thank you for taking time to come for yourMedicare Wellness Visit. I appreciate your ongoing commitment to your health goals. Please review the following plan we discussed and let me know if I can assist you in the future.   These are the goals we discussed:  Goals    . Blood Pressure < 150/90    . HDL > 40    . LDL CALC < 100    . Weight < 180 lb (81.647 kg)         Health Maintenance for Postmenopausal Women Menopause is a normal process in which your reproductive ability comes to an end. This process happens gradually over a span of months to years, usually between the ages of 63 and 52. Menopause is complete when you have missed 12 consecutive menstrual periods. It is important to talk with your health care provider about some of the most common conditions that affect postmenopausal women, such as heart disease, cancer, and bone loss (osteoporosis). Adopting a healthy lifestyle and getting preventive care can help to promote your health and wellness. Those actions can also lower your chances of developing some of these common conditions. What should I know about menopause? During menopause, you may experience a number of symptoms, such as:  Moderate-to-severe hot flashes.  Night sweats.  Decrease in sex drive.  Mood swings.  Headaches.  Tiredness.  Irritability.  Memory problems.  Insomnia.  Choosing to treat or not to treat menopausal changes is an individual decision that you make with your health care provider. What should I know about hormone replacement therapy and supplements? Hormone therapy products are effective for treating symptoms that are associated with menopause, such as hot flashes and night sweats. Hormone replacement carries certain risks, especially as you become older. If you are thinking about using estrogen or estrogen with progestin treatments, discuss the benefits and risks with your health care provider. What should I know about  heart disease and stroke? Heart disease, heart attack, and stroke become more likely as you age. This may be due, in part, to the hormonal changes that your body experiences during menopause. These can affect how your body processes dietary fats, triglycerides, and cholesterol. Heart attack and stroke are both medical emergencies. There are many things that you can do to help prevent heart disease and stroke:  Have your blood pressure checked at least every 1-2 years. High blood pressure causes heart disease and increases the risk of stroke.  If you are 5-33 years old, ask your health care provider if you should take aspirin to prevent a heart attack or a stroke.  Do not use any tobacco products, including cigarettes, chewing tobacco, or electronic cigarettes. If you need help quitting, ask your health care provider.  It is important to eat a healthy diet and maintain a healthy weight. ? Be sure to include plenty of vegetables, fruits, low-fat dairy products, and lean protein. ? Avoid eating foods that are high in solid fats, added sugars, or salt (sodium).  Get regular exercise. This is one of the most important things that you can do for your health. ? Try to exercise for at least 150 minutes each week. The type of exercise that you do should increase your heart rate and make you sweat. This is known as moderate-intensity exercise. ? Try to do strengthening exercises at least twice each week. Do these in addition to the moderate-intensity exercise.  Know your numbers.Ask your health care provider to check your  cholesterol and your blood glucose. Continue to have your blood tested as directed by your health care provider.  What should I know about cancer screening? There are several types of cancer. Take the following steps to reduce your risk and to catch any cancer development as early as possible. Breast Cancer  Practice breast self-awareness. ? This means understanding how your  breasts normally appear and feel. ? It also means doing regular breast self-exams. Let your health care provider know about any changes, no matter how small.  If you are 7 or older, have a clinician do a breast exam (clinical breast exam or CBE) every year. Depending on your age, family history, and medical history, it may be recommended that you also have a yearly breast X-ray (mammogram).  If you have a family history of breast cancer, talk with your health care provider about genetic screening.  If you are at high risk for breast cancer, talk with your health care provider about having an MRI and a mammogram every year.  Breast cancer (BRCA) gene test is recommended for women who have family members with BRCA-related cancers. Results of the assessment will determine the need for genetic counseling and BRCA1 and for BRCA2 testing. BRCA-related cancers include these types: ? Breast. This occurs in males or females. ? Ovarian. ? Tubal. This may also be called fallopian tube cancer. ? Cancer of the abdominal or pelvic lining (peritoneal cancer). ? Prostate. ? Pancreatic.  Cervical, Uterine, and Ovarian Cancer Your health care provider may recommend that you be screened regularly for cancer of the pelvic organs. These include your ovaries, uterus, and vagina. This screening involves a pelvic exam, which includes checking for microscopic changes to the surface of your cervix (Pap test).  For women ages 21-65, health care providers may recommend a pelvic exam and a Pap test every three years. For women ages 10-65, they may recommend the Pap test and pelvic exam, combined with testing for human papilloma virus (HPV), every five years. Some types of HPV increase your risk of cervical cancer. Testing for HPV may also be done on women of any age who have unclear Pap test results.  Other health care providers may not recommend any screening for nonpregnant women who are considered low risk for pelvic  cancer and have no symptoms. Ask your health care provider if a screening pelvic exam is right for you.  If you have had past treatment for cervical cancer or a condition that could lead to cancer, you need Pap tests and screening for cancer for at least 20 years after your treatment. If Pap tests have been discontinued for you, your risk factors (such as having a new sexual partner) need to be reassessed to determine if you should start having screenings again. Some women have medical problems that increase the chance of getting cervical cancer. In these cases, your health care provider may recommend that you have screening and Pap tests more often.  If you have a family history of uterine cancer or ovarian cancer, talk with your health care provider about genetic screening.  If you have vaginal bleeding after reaching menopause, tell your health care provider.  There are currently no reliable tests available to screen for ovarian cancer.  Lung Cancer Lung cancer screening is recommended for adults 69-26 years old who are at high risk for lung cancer because of a history of smoking. A yearly low-dose CT scan of the lungs is recommended if you:  Currently smoke.  Have  a history of at least 30 pack-years of smoking and you currently smoke or have quit within the past 15 years. A pack-year is smoking an average of one pack of cigarettes per day for one year.  Yearly screening should:  Continue until it has been 15 years since you quit.  Stop if you develop a health problem that would prevent you from having lung cancer treatment.  Colorectal Cancer  This type of cancer can be detected and can often be prevented.  Routine colorectal cancer screening usually begins at age 34 and continues through age 35.  If you have risk factors for colon cancer, your health care provider may recommend that you be screened at an earlier age.  If you have a family history of colorectal cancer, talk with  your health care provider about genetic screening.  Your health care provider may also recommend using home test kits to check for hidden blood in your stool.  A small camera at the end of a tube can be used to examine your colon directly (sigmoidoscopy or colonoscopy). This is done to check for the earliest forms of colorectal cancer.  Direct examination of the colon should be repeated every 5-10 years until age 29. However, if early forms of precancerous polyps or small growths are found or if you have a family history or genetic risk for colorectal cancer, you may need to be screened more often.  Skin Cancer  Check your skin from head to toe regularly.  Monitor any moles. Be sure to tell your health care provider: ? About any new moles or changes in moles, especially if there is a change in a mole's shape or color. ? If you have a mole that is larger than the size of a pencil eraser.  If any of your family members has a history of skin cancer, especially at a young age, talk with your health care provider about genetic screening.  Always use sunscreen. Apply sunscreen liberally and repeatedly throughout the day.  Whenever you are outside, protect yourself by wearing long sleeves, pants, a wide-brimmed hat, and sunglasses.  What should I know about osteoporosis? Osteoporosis is a condition in which bone destruction happens more quickly than new bone creation. After menopause, you may be at an increased risk for osteoporosis. To help prevent osteoporosis or the bone fractures that can happen because of osteoporosis, the following is recommended:  If you are 39-72 years old, get at least 1,000 mg of calcium and at least 600 mg of vitamin D per day.  If you are older than age 51 but younger than age 62, get at least 1,200 mg of calcium and at least 600 mg of vitamin D per day.  If you are older than age 71, get at least 1,200 mg of calcium and at least 800 mg of vitamin D per  day.  Smoking and excessive alcohol intake increase the risk of osteoporosis. Eat foods that are rich in calcium and vitamin D, and do weight-bearing exercises several times each week as directed by your health care provider. What should I know about how menopause affects my mental health? Depression may occur at any age, but it is more common as you become older. Common symptoms of depression include:  Low or sad mood.  Changes in sleep patterns.  Changes in appetite or eating patterns.  Feeling an overall lack of motivation or enjoyment of activities that you previously enjoyed.  Frequent crying spells.  Talk with your health  care provider if you think that you are experiencing depression. What should I know about immunizations? It is important that you get and maintain your immunizations. These include:  Tetanus, diphtheria, and pertussis (Tdap) booster vaccine.  Influenza every year before the flu season begins.  Pneumonia vaccine.  Shingles vaccine.  Your health care provider may also recommend other immunizations. This information is not intended to replace advice given to you by your health care provider. Make sure you discuss any questions you have with your health care provider. Document Released: 04/08/2005 Document Revised: 09/04/2015 Document Reviewed: 11/18/2014 Elsevier Interactive Patient Education  2018 [place] in the Home Falls can cause injuries and can affect people from all age groups. There are many simple things that you can do to make your home safe and to help prevent falls. What can I do on the outside of my home?  Regularly repair the edges of walkways and driveways and fix any cracks.  Remove high doorway thresholds.  Trim any shrubbery on the main path into your home.  Use bright outdoor lighting.  Clear walkways of debris and clutter, including tools and rocks.  Regularly check that handrails are securely fastened and  in good repair. Both sides of any steps should have handrails.  Install guardrails along the edges of any raised decks or porches.  Have leaves, snow, and ice cleared regularly.  Use sand or salt on walkways during winter months.  In the garage, clean up any spills right away, including grease or oil spills. What can I do in the bathroom?  Use night lights.  Install grab bars by the toilet and in the tub and shower. Do not use towel bars as grab bars.  Use non-skid mats or decals on the floor of the tub or shower.  If you need to sit down while you are in the shower, use a plastic, non-slip stool.  Keep the floor dry. Immediately clean up any water that spills on the floor.  Remove soap buildup in the tub or shower on a regular basis.  Attach bath mats securely with double-sided non-slip rug tape.  Remove throw rugs and other tripping hazards from the floor. What can I do in the bedroom?  Use night lights.  Make sure that a bedside light is easy to reach.  Do not use oversized bedding that drapes onto the floor.  Have a firm chair that has side arms to use for getting dressed.  Remove throw rugs and other tripping hazards from the floor. What can I do in the kitchen?  Clean up any spills right away.  Avoid walking on wet floors.  Place frequently used items in easy-to-reach places.  If you need to reach for something above you, use a sturdy step stool that has a grab bar.  Keep electrical cables out of the way.  Do not use floor polish or wax that makes floors slippery. If you have to use wax, make sure that it is non-skid floor wax.  Remove throw rugs and other tripping hazards from the floor. What can I do in the stairways?  Do not leave any items on the stairs.  Make sure that there are handrails on both sides of the stairs. Fix handrails that are broken or loose. Make sure that handrails are as long as the stairways.  Check any carpeting to make sure that  it is firmly attached to the stairs. Fix any carpet that is loose or worn.  Avoid  having throw rugs at the top or bottom of stairways, or secure the rugs with carpet tape to prevent them from moving.  Make sure that you have a light switch at the top of the stairs and the bottom of the stairs. If you do not have them, have them installed. What are some other fall prevention tips?  Wear closed-toe shoes that fit well and support your feet. Wear shoes that have rubber soles or low heels.  When you use a stepladder, make sure that it is completely opened and that the sides are firmly locked. Have someone hold the ladder while you are using it. Do not climb a closed stepladder.  Add color or contrast paint or tape to grab bars and handrails in your home. Place contrasting color strips on the first and last steps.  Use mobility aids as needed, such as canes, walkers, scooters, and crutches.  Turn on lights if it is dark. Replace any light bulbs that burn out.  Set up furniture so that there are clear paths. Keep the furniture in the same spot.  Fix any uneven floor surfaces.  Choose a carpet design that does not hide the edge of steps of a stairway.  Be aware of any and all pets.  Review your medicines with your healthcare provider. Some medicines can cause dizziness or changes in blood pressure, which increase your risk of falling. Talk with your health care provider about other ways that you can decrease your risk of falls. This may include working with a physical therapist or trainer to improve your strength, balance, and endurance. This information is not intended to replace advice given to you by your health care provider. Make sure you discuss any questions you have with your health care provider. Document Released: 02/04/2002 Document Revised: 07/14/2015 Document Reviewed: 03/21/2014 Elsevier Interactive Patient Education  2017 [employer].

## 2016-09-22 NOTE — Patient Instructions (Signed)
Your Blood pressure looks very good.  Remember to take a baby aspirin every day to help protect your heart.  Take Vitamin D (over the counter) 1000 units daily.  We are checking your kidney function today with blood tests.  Dr McDiarmid will call you if your tests are not good. Otherwise he will send you a letter.  If you sign up for MyChart online, you will be able to see your test results once Dr McDiarmid has reviewed them.  If you do not hear from [place] with in 2 weeks please call our office  Dr McDiarmid will send a message to Dr Alinda Money (urologist) to see if they need to see you for cancer surveillance.

## 2016-09-22 NOTE — Progress Notes (Signed)
Subjective:   [name] is a 83 y.o. female who presents for an Initial Medicare Annual Wellness Visit.   Cardiac Risk Factors include: advanced age (>75men, >30 women);family history of premature cardiovascular disease;dyslipidemia;smoking/ tobacco exposure;sedentary lifestyle     Objective:    Today's Vitals   09/22/16 0953  Weight: 170 lb (77.1 kg)  Height: 5\' 5"  (1.651 m)  PainSc: 0-No pain   Body mass index is 28.29 kg/m.   Current Medications (verified) Outpatient Encounter Prescriptions as of 09/22/2016  Medication Sig  . amiodarone (PACERONE) 200 MG tablet TAKE 1 TABLET(200 MG) BY MOUTH DAILY  . aspirin EC 81 MG tablet Take 81 mg by mouth daily.  Marland Kitchen atorvastatin (LIPITOR) 20 MG tablet Take 1 tablet (20 mg total) by mouth daily.  . calcium carbonate (OSCAL) 1500 (600 Ca) MG TABS tablet Take 600 mg of elemental calcium by mouth 2 (two) times daily with a meal.  . ELIQUIS 2.5 MG TABS tablet TAKE 1 TABLET BY MOUTH TWICE DAILY  . FIBER DIET TABS Take 2 tablets by mouth daily.  . fluticasone (FLONASE) 50 MCG/ACT nasal spray SHAKE LIQUID AND USE 2 SPRAYS IN EACH NOSTRIL DAILY  . Fluticasone-Umeclidin-Vilant (TRELEGY ELLIPTA) 100-62.5-25 MCG/INH AEPB Inhale 1 Dose into the lungs daily.  . metoprolol succinate (TOPROL-XL) 25 MG 24 hr tablet Take 1 tablet (25 mg total) by mouth daily.  Marland Kitchen omeprazole (PRILOSEC) 20 MG capsule TAKE 1 CAPSULE(20 MG) BY MOUTH DAILY  . traMADol (ULTRAM) 50 MG tablet TAKE 1 TABLET BY MOUTH EVERY 6 HOURS AS NEEDED FOR PAIN  . VENTOLIN HFA 108 (90 Base) MCG/ACT inhaler INHALE 2 PUFFS WITH SPACER EVERY 4 HOURS AS NEEDED FOR WHEEZING  . vitamin B-12 (CYANOCOBALAMIN) 1000 MCG tablet Take 1,000 mcg by mouth daily.  . Cholecalciferol 1000 units tablet Take 1 tablet by mouth daily.   . nitroGLYCERIN (NITROSTAT) 0.4 MG SL tablet Place 1 tablet (0.4 mg total) under the tongue every 5 (five) minutes as needed for chest pain (x 3 tabs). (Patient not taking:  Reported on 09/22/2016)  . SENNOSIDES PO Take 2 tablets by mouth daily.  . [DISCONTINUED] Ascorbic Acid (VITAMIN C WITH ROSE HIPS) 1000 MG tablet Take 1,000 mg by mouth daily.  . [DISCONTINUED] Biotin 5000 MCG TABS Take by mouth.  . [DISCONTINUED] Misc. Devices (ROLLATOR) MISC 1 Device by Does not apply route daily.  . [DISCONTINUED] predniSONE (DELTASONE) 20 MG tablet Two daily with food   No facility-administered encounter medications on file as of 09/22/2016.     Allergies (verified) Baclofen; Codeine phosphate; Norco [hydrocodone-acetaminophen]; Hydrochlorothiazide; and Montelukast sodium   History: Past Medical History:  Diagnosis Date  . Abnormal mammogram, unspecified 08/23/2010   Followup imaging reassuring.   . Acute appendicitis with rupture   . ADENOMATOUS COLONIC POLYP 03/01/2003   Qualifier: Diagnosis of  By: McDiarmid MD, Sherren Mocha Multiple benign polyps of cecum, ascending, transverse and sigmoid colon by 8/09 colonoscopy by Dr Cristina Gong  Colonoscopy by Dr Cristina Gong for iron-deficiency anemia on 04/27/2010 showed three sessile polyps that were in ascending (3 mm x 9 mm), transverse (4 mm), and cecum (3 mm).  All three were tubular adenomas that were negative for high grade dysplasia or malignancy on pathology. Dr Cristina Gong called the polpys benign and not requiring follow-up in view of the patients age.    . Adrenal adenoma    Incidentaloma  . AF (paroxysmal atrial fibrillation) ([place]) 11/11/2010   Hospitalization (9/8-9/10, Dr Daneen Schick, III, Cardiology) for Paroxysmal Atrial  Fibrillation with RVR and anginal pain secondary to demand/supply mismatch in setting of RVR with known circumflex artery branch disease.    Marland Kitchen ANXIETY 04/27/2006   Qualifier: Diagnosis of  By: McDiarmid MD, Sherren Mocha    . Benign essential tremor 02/04/2016  . Candidiasis of the esophagus 11/26/2007  . CAP (community acquired pneumonia) 02/25/2015  . Cataract 2013   Bilateral   . Cellulitis of leg, right 10/01/2012  .  Cellulitis of right lower extremity   . Chest pain with moderate risk of acute coronary syndrome 05/21/2015  . Chronic kidney disease (CKD), stage III (moderate)   . Concussion with loss of consciousness   . COPD 04/27/2006   Qualifier: Diagnosis of  By: McDiarmid MD, Sherren Mocha    . COPD exacerbation ([place])   . COPD, severe   . Decreased functional mobility and endurance 03/17/2015  . Diastolic heart failure ([place]) 07/30/2015  . DISC WITH RADICULOPATHY 04/27/2006   Qualifier: Diagnosis of  By: McDiarmid MD, Sherren Mocha    . EDEMA-LEGS,DUE TO VENOUS OBSTRUCT. 04/27/2006   Qualifier: Diagnosis of  By: McDiarmid MD, Sherren Mocha    . 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 Normal uric acid serum level at time of attack.    Marland Kitchen HERNIA, HIATAL, NONCONGENITAL 04/27/2006   Qualifier: Diagnosis of  By: McDiarmid MD, Sherren Mocha    . High risk medications (not anticoagulants) long-term use 03/05/2012  . History of Hemorrhoids 04/27/2006   Qualifier: Diagnosis of  By: McDiarmid MD, Sherren Mocha    . History of iron deficiency 01/16/2015  . Hx of colonoscopy with polypectomy 04/27/2010   Dr Cristina Gong found three  tubular adenomas each less than 10 mm size  . Hypertension   . Hypotension 07/09/2015  . Iron deficiency anemia 08/05/2010   Dr Cristina Gong (GI) has evaluated with EGD, colonoscopy, and video capsular endoscopy in 2011 & 2012.  All have been unrevealing as to an origin of IDA.  OV with Dr Cristina Gong (10/28/10) assessment of blood in stool per hemoccult and GER. Hbg 12.1 g/dL, MCV 91.8, Ferritin 30 ng/mL. Patient taking on ferrous sulfate tab daily.   EGD on 03/06/12 by Dr Cristina Gong for IDA non-obstructing Schatzki's ring at Gastroesophageal junction, otherwise normal esophagus and stomach.     . Leg cramps 05/29/2012  . Lumbar herniated disc    History of HNP L4/5 in 2003  . Macular degeneration, bilateral 10/04/2010   Right eye is wet MD, the other is dry macular degeneration  (ARMD). Pt undergoing some form of vascular endothelial growth factor inhibition intraocular therapy.    . Mild cognitive impairment 10/26/2012   (10/25/12) Failed MiniCog screen  . MUSCLE CRAMPS 03/11/2010   Qualifier: Diagnosis of  By: McDiarmid MD, Sherren Mocha    . Muscle spasm of back 09/24/2013  . Myocardial infarct, old   . Numbness and tingling in hands 07/21/2011  . Pacemaker    MDT  . PREDIABETES 09/11/2007   Qualifier: Diagnosis of  By: McDiarmid MD, Sherren Mocha    . Retinal hemorrhage of left eye 06/2010  . RHINITIS, ALLERGIC 04/27/2006   Qualifier: Diagnosis of  By: McDiarmid MD, Sherren Mocha    . SCHATZKI'S RING, HX OF 11/26/2007   Qualifier: Diagnosis of  By: McDiarmid MD, Sherren Mocha  An EGD was performed by Dr Cristina Gong on 04/27/2010 for iron deficiency anemia. There was a a transient hiatal hernia with Schatzki's ring. Stomach and duodenum were normal. EGD on 03/06/12  by Dr Cristina Gong for IDA non-obstructing Schatzki's ring at Gastroesophageal junction, otherwise normal esophagus and stomach.    . Seborrheic keratosis, right anterior thigh 12/12/2013  . Shoulder pain, left 01/09/2014  . Sick sinus syndrome with tachycardia ([place])    MDT  . Soft tissue injury of foot 05/03/2011  . Solar lentigo 06/15/2012  . Solitary kidney, acquired 05/17/2010   Surgical removal for transitional cell cancer by Tresa Endo, MD ([place]). Surveillance cystoscopy by Dr Alinda Money [facility]) on 10/19/12 without evidence of cystoscopic recurrence. Recommend RTC one year for cystoscopy.   Marland Kitchen Spinal stenosis, lumbar   . Transitional cell carcinoma of ureter, history   . Urge incontinence 12/13/2011   Diagnosed in 10/2011 by Dr Bjorn Loser (Urology)   . VENTRICULAR HYPERTROPHY, LEFT 08/28/2008   Qualifier: Diagnosis of  By: McDiarmid MD, Sherren Mocha    . VITAMIN B12 DEFICIENCY 10/07/2009   Qualifier: Diagnosis of  By: McDiarmid MD, Sherren Mocha  Dx based on a post-TKR anemia work-up Low normal serum B12 with high Methylmalonic acid and homocysteine level   Vit B12 serum level (10/28/10) > 1500 pg/mL   . Vitamin D deficiency 11/02/2010   Serum vitamin D 25(OH) = 10.9 ng/mL (30 -100) on 10/28/10 c/w Vitamin D deficiency.      Past Surgical History:  Procedure Laterality Date  . BREAST SURGERY     breast reduction  . CHOLECYSTECTOMY    . CORONARY ANGIOPLASTY WITH STENT PLACEMENT  2010   BMS RCA, OM2 occluded  . CYSTOSCOPY  09/30/2015   No cystoscopic evidence of uroepithelial neoplasm.  Follow up surveillance cystoscopy in one year  . DUPUYTREN CONTRACTURE RELEASE Right 05/22/2014   Procedure: DUPUYTREN RELEASE AND REPAIR AS NECESSARY RIGHT RING FINGER AND MIDDLE FINGER;  Surgeon: Roseanne Kaufman, MD;  Location: [facility];  Service: Orthopedics;  Laterality: Right;  . ESOPHAGOGASTRODUODENOSCOPY  04/27/2010   Dr Cristina Gong - found transient H/H & Schatzki's ring  . ESOPHAGOGASTRODUODENOSCOPY ENDOSCOPY  03/06/2012   Dr Cristina Gong - found non-obstuctive Schatzki's ring at [employer] o/w normal EGD.   Marland Kitchen EYE SURGERY     bilateral cataracts  . KNEE ARTHROSCOPY W/ SYNOVECTOMY  11/2009, left knee   Dr Wynelle Link  . NEPHRECTOMY  For transition cell cancer    Dr Tresa Endo, surgeon  . PACEMAKER GENERATOR CHANGE N/A 11/12/2012   Procedure: PACEMAKER GENERATOR CHANGE;  Surgeon: Evans Lance, MD; [employer] [facility] dual-chamber pacemaker serial number [id number]; Laterality: Right  . PACEMAKER INSERTION  2005   Dr Doreatha Lew  . PACEMAKER LEAD REMOVAL  2005   Removal and reinsertion of atrial and ventricular leads due to migration  . REPLACEMENT TOTAL KNEE  2009, Right knee   Dr Wynelle Link  . REPLACEMENT TOTAL KNEE  05/2009, Left knee   Dr Wynelle Link  . TONSILLECTOMY    . TRIGGER FINGER RELEASE Right 05/22/2014   Procedure: RIGHT HAND A-1 PULLEY RELEASE ;  Surgeon: Roseanne Kaufman, MD;  Location: [facility];  Service: Orthopedics;  Laterality: Right;   Family History  Problem Relation Age of Onset  . Dementia Mother   . Heart disease Father   . Heart attack Father    Social  History   Occupational History  . Futures trader Retired    Retired   Social History Main Topics  . Smoking status: Former Smoker    Packs/day: 1.00    Types: Cigarettes    Quit date: 03/02/2009  . Smokeless tobacco: Never Used  . Alcohol use 7.8 oz/week  6 Standard drinks or equivalent, 7 Glasses of wine per week     Comment: 5 oz white wine per day  . Drug use: No  . Sexual activity: No    Tobacco Counseling Patient is former smoker with no plans to restart  Activities of Daily Living In your present state of health, do you have any difficulty performing the following activities: 09/22/2016 11/24/2015  Hearing? Y N  Vision? N N  Difficulty concentrating or making decisions? N N  Walking or climbing stairs? Y Y  Dressing or bathing? N N  Doing errands, shopping? N N  Preparing Food and eating ? N N  Using the Toilet? N N  In the past six months, have you accidently leaked urine? N N  Do you have problems with loss of bowel control? N N  Managing your Medications? N N  Managing your Finances? N N  Housekeeping or managing your Housekeeping? Y N  Some recent data might be hidden   Home Safety:  My home has a working smoke alarm:  Yes X 2           My home throw rugs have been fastened down to the floor or removed:  Yes I have a non-slip surface or non-slip mats in the bathtub and shower:  Yes        All my home's stairs have handrails, including any outdoor stairs  Two level home with handrails inside and 4 outside steps with handrail          My home's floors, stairs and hallways are free from clutter, wires and cords:  Yes     I have animals in my home  No I wear seatbelts consistently:  Yes    Immunizations and Health Maintenance Immunization History  Administered Date(s) Administered  . Influenza Split 11/11/2010, 11/07/2011  . Influenza Whole 12/21/2006, 11/26/2007, 12/01/2008  . Influenza, Seasonal, Injecte, Preservative Fre 03/08/2011  .  Influenza,inj,Quad PF,36+ Mos 10/25/2012, 12/12/2013, 11/13/2014, 11/16/2015  . Pneumococcal Conjugate-13 10/17/2013  . Pneumococcal Polysaccharide-23 02/29/2000, 03/08/2011  . Td 05/30/2003   There are no preventive care reminders to display for this patient.  Patient Care Team: McDiarmid, Blane Ohara, MD as PCP - General Myrlene Broker, MD (Urology) Belva Crome, MD as Consulting Physician (Cardiology) [name], MD (Orthopedic Surgery) Evans Lance, MD (Cardiology) Luberta Mutter, MD as Consulting Physician (Ophthalmology) Raynelle Bring, MD as Consulting Physician (Urology) Lamonte Sakai Rose Fillers, MD as Consulting Physician (Pulmonary Disease) Dr. Cristina Gong for GI Dr. Ricki Miller also for Opthalmology  Indicate any recent Medical Services you may have received from other than [facility] providers in the past year (date may be approximate).     Assessment:   This is a routine wellness examination for [place].   Hearing/Vision screen  Hearing Screening   Method: Audiometry   125Hz  250Hz  500Hz  1000Hz  2000Hz  3000Hz  4000Hz  6000Hz  8000Hz   Right ear:   40 40 Fail  Fail    Left ear:   40 Fail Fail  Fail      Dietary issues and exercise activities discussed: Current Exercise Habits: Home exercise routine, Type of exercise: walking, Time (Minutes): 10, Frequency (Times/Week): 2, Weekly Exercise (Minutes/Week): 20, Intensity: Mild, Exercise limited by: respiratory conditions(s)  Goals    . Blood Pressure < 150/90    . HDL > 40    . LDL CALC < 100    . Weight < 180 lb (81.647 kg)      Depression Screen PHQ  2/9 Scores 09/22/2016 09/22/2016 08/11/2016 06/23/2016 05/12/2016 04/28/2016 04/14/2016  PHQ - 2 Score 0 0 0 0 0 0 0    Fall Risk Fall Risk  09/22/2016 09/22/2016 08/11/2016 06/23/2016 05/12/2016  Falls in the past year? No Yes Yes Yes Yes  Number falls in past yr: - 1 1 1 1   Injury with Fall? - No No No No  Risk for fall due to : - - - - -  Follow up - - - - -   TUG Test:  Done in 17  seconds. Patient wearing kitten heels with no back strap. Discussed wearing closed, well-fitting shoes to decrease risk of fall. Falls prevention discussed in detail and literature given.  Cognitive Function: Mini-Cog  Failed with score 2/5  Screening Tests Health Maintenance  Topic Date Due  . INFLUENZA VACCINE  09/28/2016  . LIPID PANEL  08/11/2017  . PNA vac Low Risk Adult  Completed   Discussed Shingrix Discussed receiving flu vaccine yearly starting 9/1.     Plan:     I have personally reviewed and noted the following in the patient's chart:   . Medical and social history . Use of alcohol, tobacco or illicit drugs  . Current medications and supplements . Functional ability and status . Nutritional status . Physical activity . Advanced directives . List of other physicians . Hospitalizations, surgeries, and ER visits in previous 12 months . Vitals . Screenings to include cognitive, depression, and falls . Referrals and appointments  In addition, I have reviewed and discussed with patient certain preventive protocols, quality metrics, and best practice recommendations. A written personalized care plan for preventive services as well as general preventive health recommendations were provided to patient.     Velora Heckler, RN   09/22/2016

## 2016-09-23 ENCOUNTER — Telehealth: Payer: Self-pay | Admitting: Emergency Medicine

## 2016-09-23 ENCOUNTER — Encounter: Payer: Self-pay | Admitting: *Deleted

## 2016-09-23 ENCOUNTER — Encounter: Payer: Self-pay | Admitting: Family Medicine

## 2016-09-23 ENCOUNTER — Telehealth: Payer: Self-pay | Admitting: *Deleted

## 2016-09-23 LAB — BASIC METABOLIC PANEL
BUN / CREAT RATIO: 19 (ref 12–28)
BUN: 26 mg/dL (ref 10–36)
CO2: 23 mmol/L (ref 20–29)
Calcium: 9.3 mg/dL (ref 8.7–10.3)
Chloride: 105 mmol/L (ref 96–106)
Creatinine, Ser: 1.37 mg/dL — ABNORMAL HIGH (ref 0.57–1.00)
GFR calc Af Amer: 39 mL/min/{1.73_m2} — ABNORMAL LOW (ref >59)
GFR, EST NON AFRICAN AMERICAN: 34 mL/min/{1.73_m2} — AB (ref >59)
GLUCOSE: 89 mg/dL (ref 65–99)
POTASSIUM: 5.2 mmol/L (ref 3.5–5.2)
SODIUM: 144 mmol/L (ref 134–144)

## 2016-09-23 MED ORDER — FLUTICASONE-UMECLIDIN-VILANT 100-62.5-25 MCG/INH IN AEPB: 1.0000 | INHALATION_SPRAY | Freq: Every day | RESPIRATORY_TRACT | 0 refills | Status: AC

## 2016-09-23 NOTE — Progress Notes (Signed)
   Subjective:    Patient ID: [name], female    DOB: 1923-10-12, 83 y.o.   MRN: [id number] JOLIN BENAVIDES is alone Sources of clinical information for visit is/are patient and past medical records. Nursing assessment for this office visit was reviewed with the patient for accuracy and revision.  HPI  CHRONIC KIDNEY DISEASE STAGE IV  - S/P nephrectomy  - Use of NSAIDS: none - Taking ACEI/ARB: no     Taking Diuretic: denies taking Lasix - Edema: ankle bilateral no worsening        Fatigue: no   Memory/Confusion: no worsening -  Activity level: full - Anemia Symptoms: no - Voiding difficulties: no  COPD Cough: occasional Phlegm: no Chest tightness: no Breathlessness with walking: no worse than baseline. Dyspnea with climbing flight of stairs in home for which she rests briefly then is able to complete stair climb.  Limitation in activity at home: able to complete iADLs and ALDs independently.  Confidence in leaving home is good Sleep disturbance from breathing problem: none Level of energy: good Taking Trelegy. Only occasional use of albuterol MDI or neb  SH: no smoking  Review of Systems See HPI    Objective:   Physical Exam VS reviewed GEN: Alert, Cooperative, Groomed, NAD HEENT: EAC bilaterally not occluded COR: RRR, No M/G/R LUNGS: BCTA, No Acc mm use, speaking in full sentences EXT: trace ankle edema bilaterally  Neuro: Oriented to person, place, and time;  Gait: Normal speed, No significant path deviation, Step through + Psych: Normal affect/thought/speech/language    Assessment & Plan:  See problem list

## 2016-09-23 NOTE — Telephone Encounter (Signed)
Patient made aware that PCP does not recommend TDaP or FIT at this time. Hubbard Hartshorn, RN, BSN

## 2016-09-23 NOTE — Telephone Encounter (Signed)
Pt aware that one sample of Trelegy will be place up front for pick up.  Pt aware and voiced her understanding. Nothing further needed.

## 2016-09-23 NOTE — Assessment & Plan Note (Signed)
Established problem that has improved.  Continue surveillance

## 2016-09-23 NOTE — Assessment & Plan Note (Signed)
Established problem Stable Continue Trelegyand prn SABA

## 2016-09-29 DIAGNOSIS — D485 Neoplasm of uncertain behavior of skin: Secondary | ICD-10-CM | POA: Diagnosis not present

## 2016-09-29 DIAGNOSIS — L814 Other melanin hyperpigmentation: Secondary | ICD-10-CM | POA: Diagnosis not present

## 2016-09-29 DIAGNOSIS — D692 Other nonthrombocytopenic purpura: Secondary | ICD-10-CM | POA: Diagnosis not present

## 2016-09-29 DIAGNOSIS — I788 Other diseases of capillaries: Secondary | ICD-10-CM | POA: Diagnosis not present

## 2016-09-29 DIAGNOSIS — D1801 Hemangioma of skin and subcutaneous tissue: Secondary | ICD-10-CM | POA: Diagnosis not present

## 2016-09-29 DIAGNOSIS — L821 Other seborrheic keratosis: Secondary | ICD-10-CM | POA: Diagnosis not present

## 2016-10-04 DIAGNOSIS — H353211 Exudative age-related macular degeneration, right eye, with active choroidal neovascularization: Secondary | ICD-10-CM | POA: Diagnosis not present

## 2016-10-04 DIAGNOSIS — H353221 Exudative age-related macular degeneration, left eye, with active choroidal neovascularization: Secondary | ICD-10-CM | POA: Diagnosis not present

## 2016-10-05 DIAGNOSIS — Z8551 Personal history of malignant neoplasm of bladder: Secondary | ICD-10-CM | POA: Diagnosis not present

## 2016-10-05 DIAGNOSIS — Z8553 Personal history of malignant neoplasm of renal pelvis: Secondary | ICD-10-CM | POA: Diagnosis not present

## 2016-10-10 ENCOUNTER — Encounter: Payer: Self-pay | Admitting: *Deleted

## 2016-10-10 NOTE — Progress Notes (Signed)
I have reviewed this visit and discussed with Lauren Ducatte, RN, BSN, and agree with her documentation.   

## 2016-10-12 ENCOUNTER — Ambulatory Visit (HOSPITAL_COMMUNITY)
Admission: EM | Admit: 2016-10-12 | Discharge: 2016-10-12 | Disposition: A | Discharge status: Home / Self Care | Payer: Medicare Other | Attending: Family Medicine | Admitting: Family Medicine

## 2016-10-12 ENCOUNTER — Encounter (HOSPITAL_COMMUNITY): Payer: Self-pay | Admitting: Emergency Medicine

## 2016-10-12 DIAGNOSIS — S80811A Abrasion, right lower leg, initial encounter: Secondary | ICD-10-CM

## 2016-10-12 DIAGNOSIS — W19XXXA Unspecified fall, initial encounter: Secondary | ICD-10-CM

## 2016-10-12 DIAGNOSIS — Z5189 Encounter for other specified aftercare: Secondary | ICD-10-CM

## 2016-10-12 NOTE — ED Triage Notes (Signed)
The patient presented to the [facility] with a complaint of a wound on her lower right leg that occurred 2 weeks ago after a fall.

## 2016-10-13 NOTE — ED Provider Notes (Signed)
  [place]   [id number] 10/12/16 Arrival Time: 0762  ASSESSMENT & PLAN:  1. Visit for wound check    Local wound care discussed. May use nonadherent bandage, otherwise open to air. Still may take some time to fully heal. No signs of infection. She may f/u with her PCP as needed.  Reviewed expectations re: course of current medical issues. Questions answered. Outlined signs and symptoms indicating need for more acute intervention. Patient verbalized understanding. After Visit Summary given.   SUBJECTIVE:  [name] is a 83 y.o. female who presents with complaint of wound to her lateral lower left extremity. Two weeks ago fell and scraped this area. Minimal bleeding. Has been keeping clean and covered with OTC bandage. No drainage. Afebrile. Ambulatory without problem. Occasionally applies Neosporin.  ROS: As per HPI.   OBJECTIVE:  Vitals:   10/12/16 1629  BP: (!) 170/92  Pulse: 96  Resp: 16  Temp: 98.6 F (37 C)  TempSrc: Oral  SpO2: 99%     General appearance: alert; no distress Extremities: no cyanosis or edema; symmetrical with no gross deformities Skin: lateral lower leg just above ankle with approximately 3x3 cm skin tear; no sign of infection; granulation tissue present; no significant swelling; non-tender Neurologic:normal gait Psychological:  alert and cooperative; normal mood and affect   Allergies  Allergen Reactions  . Baclofen Other (See Comments)    Confusion occurred with taking 3 at the same time.  . Codeine Phosphate Nausea Only  . Norco [Hydrocodone-Acetaminophen] Nausea And Vomiting  . Hydrochlorothiazide Other (See Comments)    Possible acute gout or arthritis of wrist  . Montelukast Sodium Other (See Comments)    Unspecified reaction.     PMHx, SurgHx, SocialHx, Medications, and Allergies were reviewed in the Visit Navigator and updated as appropriate.      Vanessa Kick, MD 10/13/16 [phone]

## 2016-10-14 ENCOUNTER — Encounter: Payer: Self-pay | Admitting: *Deleted

## 2016-10-27 ENCOUNTER — Ambulatory Visit (INDEPENDENT_AMBULATORY_CARE_PROVIDER_SITE_OTHER): Payer: Medicare Other | Admitting: Family Medicine

## 2016-10-27 ENCOUNTER — Encounter: Payer: Self-pay | Admitting: Family Medicine

## 2016-10-27 VITALS — BP 112/64 | HR 94 | Temp 98.0°F | Ht 60.0 in | Wt 172.0 lb

## 2016-10-27 DIAGNOSIS — I2510 Atherosclerotic heart disease of native coronary artery without angina pectoris: Secondary | ICD-10-CM

## 2016-10-27 DIAGNOSIS — Z23 Encounter for immunization: Secondary | ICD-10-CM

## 2016-10-27 DIAGNOSIS — R351 Nocturia: Secondary | ICD-10-CM

## 2016-10-27 DIAGNOSIS — Z79899 Other long term (current) drug therapy: Secondary | ICD-10-CM

## 2016-10-27 DIAGNOSIS — W19XXXA Unspecified fall, initial encounter: Secondary | ICD-10-CM | POA: Insufficient documentation

## 2016-10-27 DIAGNOSIS — Z9861 Coronary angioplasty status: Secondary | ICD-10-CM

## 2016-10-27 HISTORY — DX: Unspecified fall, initial encounter: W19.XXXA

## 2016-10-27 LAB — POCT URINALYSIS DIP (MANUAL ENTRY)
Bilirubin, UA: NEGATIVE
Glucose, UA: NEGATIVE mg/dL
NITRITE UA: NEGATIVE
RBC UA: NEGATIVE
SPEC GRAV UA: 1.02 (ref 1.010–1.025)
UROBILINOGEN UA: 0.2 U/dL
pH, UA: 5.5 (ref 5.0–8.0)

## 2016-10-27 MED ORDER — ALBUTEROL SULFATE HFA 108 (90 BASE) MCG/ACT IN AERS: INHALATION_SPRAY | RESPIRATORY_TRACT | 5 refills | Status: DC

## 2016-10-27 MED ORDER — MIRABEGRON ER 25 MG PO TB24: 25.0000 mg | ORAL_TABLET | Freq: Every day | ORAL | 1 refills | Status: DC

## 2016-10-27 NOTE — Assessment & Plan Note (Signed)
Recommendations: Multifactorial Interventions Prescribe exercise, particularly balance, strength, and gait training Discontinue or minimize psychoactive and other medications Manage postural hypotension Manage foot problems and footwear Supplement vitamin D Treat vision impairment Manage heart rate and rhythm abnormalities Modify the home environment Osteoporosis testing/screening/treatment

## 2016-10-27 NOTE — Progress Notes (Signed)
  Urinary Incontinence Template Change daily activities because of urinary incontinence?nonr How big a problem has urinary incontinence been to social activity in last month? none  HPI General   Onset abrupt starting 1 month ago     Frequency 4-5 times a day             Volume moderate     Timing  in the middle of the night  Preciptants (Cough, sneeze, lifting, exercising) no  Sudden Urge to void yes Red Flags (possible neurologic or neoplastic cause)   Sudden onset yes    Hematuria no     Pelvic Pain no    Severe Straining no   Inability to void no Lower Tract symptoms          Frequency no    Nocturia yes             Slow stream no    Hesitancy no  Dribbling no   Interrupted voiding no  Dysuria no   Other Medical Conditions: CKD, last glass of wine around 6 pm, solitary kidney s/p nephrectomy for transitinal cell cance, intermittent leg edemar ( no DM, no CVA, No dementia, No MS,No Spinal stenosis, No peripheral neuropathy,  No chronic cough, No constipation, solitary , no gynecologic surgeries, no impaired mobility)  Medications/substances: using lasix about once a month for leg edema Ready availability of commode yes Assistance with toileting not required   How incontinence affects life interrupting sleep                How distressing is the incontinence some   SH: Quit smoking Physical Exam Cardiovascular: Rate RRR         M/G/R no       JVD no       leg edema trace to 1+ bilateral ankle edema Lung: Crackles absent Abd: palpable bladder absent   Neuro: Cognition A&O x 4                 Babinski   downgoing                      Sensory monofilament: no sensory deficits noted            Gait normal  LABS Urinalysis with microscopy (at initial evaluation or worsening of symptoms)   Hematuria absent            Glycosuria absent Serum Creatinine 1.63 (last 1.4 09/22/16) range 1.25 to 1.63 since March 2018 Serum Calcium and Glucose (polyuric) SCa 9.3,  SGlu 87 Serum  Vit B12 373

## 2016-10-27 NOTE — Patient Instructions (Signed)
We are checking for causes of having to urinate so much at night.   Try the Myrbetriq at bedtime each night to see if it can cut down getting up to pee by at least 50%.

## 2016-10-28 ENCOUNTER — Encounter: Payer: Self-pay | Admitting: Family Medicine

## 2016-10-28 DIAGNOSIS — R351 Nocturia: Secondary | ICD-10-CM

## 2016-10-28 HISTORY — DX: Nocturia: R35.1

## 2016-10-28 LAB — VITAMIN B12: Vitamin B-12: 374 pg/mL (ref 232–1245)

## 2016-10-28 LAB — BASIC METABOLIC PANEL
BUN/Creatinine Ratio: 26 (ref 12–28)
BUN: 42 mg/dL — AB (ref 10–36)
CO2: 23 mmol/L (ref 20–29)
Calcium: 9.3 mg/dL (ref 8.7–10.3)
Chloride: 104 mmol/L (ref 96–106)
Creatinine, Ser: 1.63 mg/dL — ABNORMAL HIGH (ref 0.57–1.00)
GFR calc Af Amer: 32 mL/min/{1.73_m2} — ABNORMAL LOW (ref >59)
GFR, EST NON AFRICAN AMERICAN: 28 mL/min/{1.73_m2} — AB (ref >59)
GLUCOSE: 87 mg/dL (ref 65–99)
Potassium: 5.2 mmol/L (ref 3.5–5.2)
Sodium: 143 mmol/L (ref 134–144)

## 2016-10-28 LAB — CBC
Hematocrit: 34.3 % (ref 34.0–46.6)
Hemoglobin: 10.8 g/dL — ABNORMAL LOW (ref 11.1–15.9)
MCH: 30.3 pg (ref 26.6–33.0)
MCHC: 31.5 g/dL (ref 31.5–35.7)
MCV: 96 fL (ref 79–97)
PLATELETS: 221 10*3/uL (ref 150–379)
RBC: 3.56 x10E6/uL — ABNORMAL LOW (ref 3.77–5.28)
RDW: 13.7 % (ref 12.3–15.4)
WBC: 5.3 10*3/uL (ref 3.4–10.8)

## 2016-10-28 NOTE — Assessment & Plan Note (Signed)
New problem No clear evidence of volume overload origin of nocturia. Recommend decrease fluid < 4 hours of sleep.  Avoid alcohol within 6 hours of sleep Avoid use of Lasix.  Weight loss Possible contribution from Overactive bladder  - trial Myrbetriq starting dose 25 mg po qhs.  GoodRx coupon for free trial given to pt.  Vasopressin  agonist medication? Non-pharmacologic ManagementTreat constipation non-pharmacologically and pharmacologically . Weight loss (decrease incontinence with weight loss of 8 pounds)   . Smoking Cessation to decrease cough . Behavioral Therapy management in stepped approach  o Fluid and caffeine management ( 32 ou < daily fluid intake < 64 ou, no fluids < 4 hours before bedtime) o Bladder Training (urge) for gradual increase time between voids - Scheduled Voids every 2 hours during day, increase time between  gradually  - Urge suppression ("Freeze and Squeeze" method) o Prompted void (offer voiding every 2 hours during day) o Pelvic muscle exercise (stress, urge, and mixed, s/p prostatectomy) . Vaginal pessaries (pelvic organ prolapse) . Surgical management o Stress Incontinence - Sling Procedure (inadequate response to behavioral and pharmacologic therapies)  - Periurethral bulking agent o Urge Incontinence - Sacral Neuromodulation (implanted stimulator for refractory urgency - Intravesicular Botox injection Pharmacologic Management . Antimuscarinics anticholinergics . Beta-3 adrenergic antagonists . Topical vaginal estrogen in postmenopausal vaginal atrophy may help with stress and urge incontinence Supportive Management . Pads and protective undergarments . Water-resistant covers for beds and chairs Follow-up . Response to treatment documented within 3 months

## 2016-11-07 ENCOUNTER — Telehealth: Payer: Self-pay | Admitting: Cardiology

## 2016-11-07 ENCOUNTER — Encounter: Payer: Medicare Other | Admitting: *Deleted

## 2016-11-07 NOTE — Telephone Encounter (Signed)
Attempted to confirm remote transmission with pt. No answer and was unable to leave a message.   

## 2016-11-10 DIAGNOSIS — H353221 Exudative age-related macular degeneration, left eye, with active choroidal neovascularization: Secondary | ICD-10-CM | POA: Diagnosis not present

## 2016-11-10 DIAGNOSIS — H353211 Exudative age-related macular degeneration, right eye, with active choroidal neovascularization: Secondary | ICD-10-CM | POA: Diagnosis not present

## 2016-11-11 ENCOUNTER — Encounter: Payer: Self-pay | Admitting: Cardiology

## 2016-11-15 ENCOUNTER — Other Ambulatory Visit: Payer: Self-pay | Admitting: Family Medicine

## 2016-11-16 DIAGNOSIS — H353211 Exudative age-related macular degeneration, right eye, with active choroidal neovascularization: Secondary | ICD-10-CM | POA: Diagnosis not present

## 2016-12-14 ENCOUNTER — Other Ambulatory Visit: Payer: Self-pay | Admitting: Family Medicine

## 2016-12-14 ENCOUNTER — Telehealth: Payer: Self-pay

## 2016-12-14 NOTE — Telephone Encounter (Signed)
Dr. McDiarmid, [name] would like for you to call her. She says that she was out of town and not feeling well the whole time and wants to know what you want her to do. She can be reached at [phone]. Patient request that you call her ASAP.[name]

## 2016-12-15 ENCOUNTER — Telehealth: Payer: Self-pay | Admitting: Family Medicine

## 2016-12-15 MED ORDER — AZITHROMYCIN 250 MG PO TABS: ORAL_TABLET | ORAL | 0 refills | Status: DC

## 2016-12-15 MED ORDER — PREDNISONE 20 MG PO TABS: 40.0000 mg | ORAL_TABLET | Freq: Every day | ORAL | 0 refills | Status: DC

## 2016-12-15 MED ORDER — ALBUTEROL SULFATE HFA 108 (90 BASE) MCG/ACT IN AERS: INHALATION_SPRAY | RESPIRATORY_TRACT | 5 refills | Status: DC

## 2016-12-15 NOTE — Telephone Encounter (Signed)
Telephone call to pt CC: Cough Onset: 2 days ago after traveling  Course worsening   Severity: moderate - fatigued Better with: started home prednisone ?60 mg last night with some improvement this morning  Symptoms Sputum:yes, purulent  Fever: no  Shortness of breath:yes, mild  Leg Swelling:no  Heart Burn or Reflux:no  Wheezing:yes  Post Nasal Drip: yes  Rhinorrhea: yes    Red Flags Weight Loss:  no Immunocompromised:  yes  PMH Asthma or COPD: yes  PMH of Smoking: yes, quit over 10 years ago  Using ACEIs: no A/ Presumed COPD exacerbation P/ Azithromycin 5 day pack Prednisone 40 mg daily x 5 d Continue Trelegy inhaler Start Albuterol MDI 2 puff QID via spacer for 5 days Call [facility] tomorrow morning for same-day appointment if not improved. Medications sent online to pharmacy

## 2016-12-17 ENCOUNTER — Other Ambulatory Visit: Payer: Self-pay | Admitting: Family Medicine

## 2016-12-17 DIAGNOSIS — I2510 Atherosclerotic heart disease of native coronary artery without angina pectoris: Secondary | ICD-10-CM

## 2016-12-17 DIAGNOSIS — E7800 Pure hypercholesterolemia, unspecified: Secondary | ICD-10-CM

## 2016-12-17 DIAGNOSIS — I252 Old myocardial infarction: Secondary | ICD-10-CM

## 2016-12-17 DIAGNOSIS — Z9189 Other specified personal risk factors, not elsewhere classified: Secondary | ICD-10-CM

## 2016-12-19 ENCOUNTER — Telehealth: Payer: Self-pay | Admitting: Family Medicine

## 2016-12-19 ENCOUNTER — Ambulatory Visit (INDEPENDENT_AMBULATORY_CARE_PROVIDER_SITE_OTHER): Payer: Medicare Other | Admitting: Emergency Medicine

## 2016-12-19 ENCOUNTER — Encounter: Payer: Self-pay | Admitting: Emergency Medicine

## 2016-12-19 DIAGNOSIS — I2510 Atherosclerotic heart disease of native coronary artery without angina pectoris: Secondary | ICD-10-CM

## 2016-12-19 DIAGNOSIS — J449 Chronic obstructive pulmonary disease, unspecified: Secondary | ICD-10-CM

## 2016-12-19 DIAGNOSIS — Z9861 Coronary angioplasty status: Secondary | ICD-10-CM | POA: Diagnosis not present

## 2016-12-19 NOTE — Patient Instructions (Signed)
Please complete the prednisone that was given to you by Dr McDiarmid  After completing, start prednisone as follows: 30mg  daily for 3 days, then 20mg  daily for 3 days, then 10mg  daily for 3 days, then stop Take levaquin 500mg  daily for 7 days.  Continue your Trelegy once a day. Take albuterol (Ventolin) 2 puffs up to every 4 hours if needed for shortness of breath.  We will work on financial assistance for the trelegy.  Follow with Dr Lamonte Sakai in 3 months or sooner if you have any problems.

## 2016-12-19 NOTE — Telephone Encounter (Signed)
Pt would like to see dr Wendy Poet in nov but both of his days in nov are booked 100%.   Could he work her in ?

## 2016-12-19 NOTE — Assessment & Plan Note (Signed)
With an acute exacerbation.  Please complete the prednisone that was given to you by Dr McDiarmid  After completing, start prednisone as follows: 30mg  daily for 3 days, then 20mg  daily for 3 days, then 10mg  daily for 3 days, then stop Take levaquin 500mg  daily for 7 days.  Continue your Trelegy once a day. Take albuterol (Ventolin) 2 puffs up to every 4 hours if needed for shortness of breath.  We will work on financial assistance for the trelegy.  Follow with Dr Lamonte Sakai in 3 months or sooner if you have any problems.

## 2016-12-19 NOTE — Progress Notes (Signed)
GERD  Subjective:    Patient ID: [name], female    DOB: [birth year], 83 y.o.   MRN: [id number]  HPI 83 yo former smoker with a history of Horner disease, A fib + sick sinus syndrome, hypertension, chronic kidney disease, GERD with a hiatal hernia. She also has significant COPD for which she has been followed by Dr. Casper Harrison at [facility].   She reports that she is benefiting significantly from the trelegy, feels that it helps her more than her prior regimens. She has had some difficulty climbing stairs, has to stop to catch her breath. She has not had any acute exacerbations. No significant cough, lots of clear nasal gtt. She has not needed any albuterol since she has been on the Trelegy.   12/19/16 Acute visit -- [name] is 73, has a history of severe COPD, hypertension, atrial fibrillation, chronic kidney disease. She has been managed on Trelegy, fluticasone nasal spray for allergic rhinitis. She just returned from a trip to [facility]. While there her breathing did well. She got home a week ago, had onset of cough prod of clear and also colored mucous. She had some prednisone so she started this 5 days ago, then was written for 40mg  daily for 5 days starting 3 days ago. She is using albuterol about     Review of Systems  Constitutional: Negative for fever and unexpected weight change.  HENT: Positive for congestion. Negative for dental problem, ear pain, nosebleeds, postnasal drip, rhinorrhea, sinus pressure, sneezing, sore throat and trouble swallowing.   Eyes: Negative for redness and itching.  Respiratory: Positive for shortness of breath. Negative for cough, chest tightness and wheezing.   Cardiovascular: Positive for palpitations. Negative for chest pain and leg swelling.  Gastrointestinal: Negative for nausea and vomiting.  Genitourinary: Negative for dysuria.  Musculoskeletal: Negative for joint swelling.  Skin: Negative for rash.  Neurological: Negative for headaches.  Hematological: Does  not bruise/bleed easily.  Psychiatric/Behavioral: Negative for dysphoric mood. The patient is nervous/anxious.         Objective:   Physical Exam  Vitals:   12/19/16 1441  BP: 128/78  Pulse: (!) 110  SpO2: 94%  Weight: 179 lb 2 oz (81.3 kg)  Height: 5\' 5"  (1.651 m)   Gen: Pleasant, elderly woman in no distress,  normal affect  ENT: No lesions,  mouth clear,  oropharynx clear, no postnasal drip, normal voice.   Neck: No JVD, no stridor  Lungs: No use of accessory muscles, Scattered expiratory wheezes  Cardiovascular: Irregular, heart sounds normal, no murmur or gallops, no peripheral edema  Musculoskeletal: No deformities, no cyanosis or clubbing  Neuro: alert, non focal  Skin: Warm, no lesions or rashes      Assessment & Plan:  COPD, severe ([place]) With an acute exacerbation.  Please complete the prednisone that was given to you by Dr McDiarmid  After completing, start prednisone as follows: 30mg  daily for 3 days, then 20mg  daily for 3 days, then 10mg  daily for 3 days, then stop Take levaquin 500mg  daily for 7 days.  Continue your Trelegy once a day. Take albuterol (Ventolin) 2 puffs up to every 4 hours if needed for shortness of breath.  We will work on financial assistance for the trelegy.  Follow with Dr Lamonte Sakai in 3 months or sooner if you have any problems.  Baltazar Apo, MD, PhD 12/19/2016, 3:00 PM [facility] [phone] or if no answer [phone]

## 2016-12-20 ENCOUNTER — Telehealth: Payer: Self-pay | Admitting: Emergency Medicine

## 2016-12-20 MED ORDER — LEVOFLOXACIN 500 MG PO TABS: 500.0000 mg | ORAL_TABLET | Freq: Every day | ORAL | 0 refills | Status: DC

## 2016-12-20 MED ORDER — PREDNISONE 10 MG PO TABS: ORAL_TABLET | ORAL | 0 refills | Status: DC

## 2016-12-20 NOTE — Telephone Encounter (Signed)
Tammy Gobble, MD (Physician)    Please complete the prednisone that was given to you by Dr McDiarmid  After completing, start prednisone as follows: 30mg  daily for 3 days, then 20mg  daily for 3 days, then 10mg  daily for 3 days, then stop Take levaquin 500mg  daily for 7 days.  Continue your Trelegy once a day. Take albuterol (Ventolin) 2 puffs up to every 4 hours if needed for shortness of breath.  We will work on financial assistance for the trelegy.  Follow with Dr Lamonte Sakai in 3 months or sooner if you have any problems.      Rx has been sent to preferred pharmacy for prednisone and Levaquin.  Pt is aware and voiced her understanding.  Nothing further needed.

## 2016-12-20 NOTE — Telephone Encounter (Signed)
Pt requesting the medication to be sent to [facility] on [address]

## 2016-12-21 DIAGNOSIS — H353211 Exudative age-related macular degeneration, right eye, with active choroidal neovascularization: Secondary | ICD-10-CM | POA: Diagnosis not present

## 2016-12-21 DIAGNOSIS — H353221 Exudative age-related macular degeneration, left eye, with active choroidal neovascularization: Secondary | ICD-10-CM | POA: Diagnosis not present

## 2017-01-04 ENCOUNTER — Telehealth: Payer: Self-pay | Admitting: Family Medicine

## 2017-01-04 NOTE — Telephone Encounter (Signed)
Will forward to MD to advise. Stephanie Littman,CMA  

## 2017-01-04 NOTE — Telephone Encounter (Signed)
Pt called again because her feet and legs are really swollen and hurting her, but their are no appts for ger PCP until December. She called a couple of weeks ago wanting to get in touch with her PCP and hasnt heard anything from him. Pt said that wasn't like him to not get in touch with her. Pt wants her Dr to call her about what to do about her legs and feet since we cant get her in in Nov at all. Please advise.

## 2017-01-05 DIAGNOSIS — H353221 Exudative age-related macular degeneration, left eye, with active choroidal neovascularization: Secondary | ICD-10-CM | POA: Diagnosis not present

## 2017-01-05 NOTE — Telephone Encounter (Signed)
Please schedule 8:30 this Tuesday in [place].  Page me when she is roomed.

## 2017-01-05 NOTE — Telephone Encounter (Signed)
Patient aware that she is scheduled for 8:30am with Dr. Cyndia Skeeters but will be seeing Dr. McDiarmid when she shows up. Tydarius Yawn,CMA

## 2017-01-09 IMAGING — DX DG CHEST 2V
2 series · 2 of 2 positions shown · non-contrast
Comparison: Multiple chest x-rays since 1408.

CLINICAL DATA: Cough.

EXAM:
CHEST  2 VIEW

[chest pa]
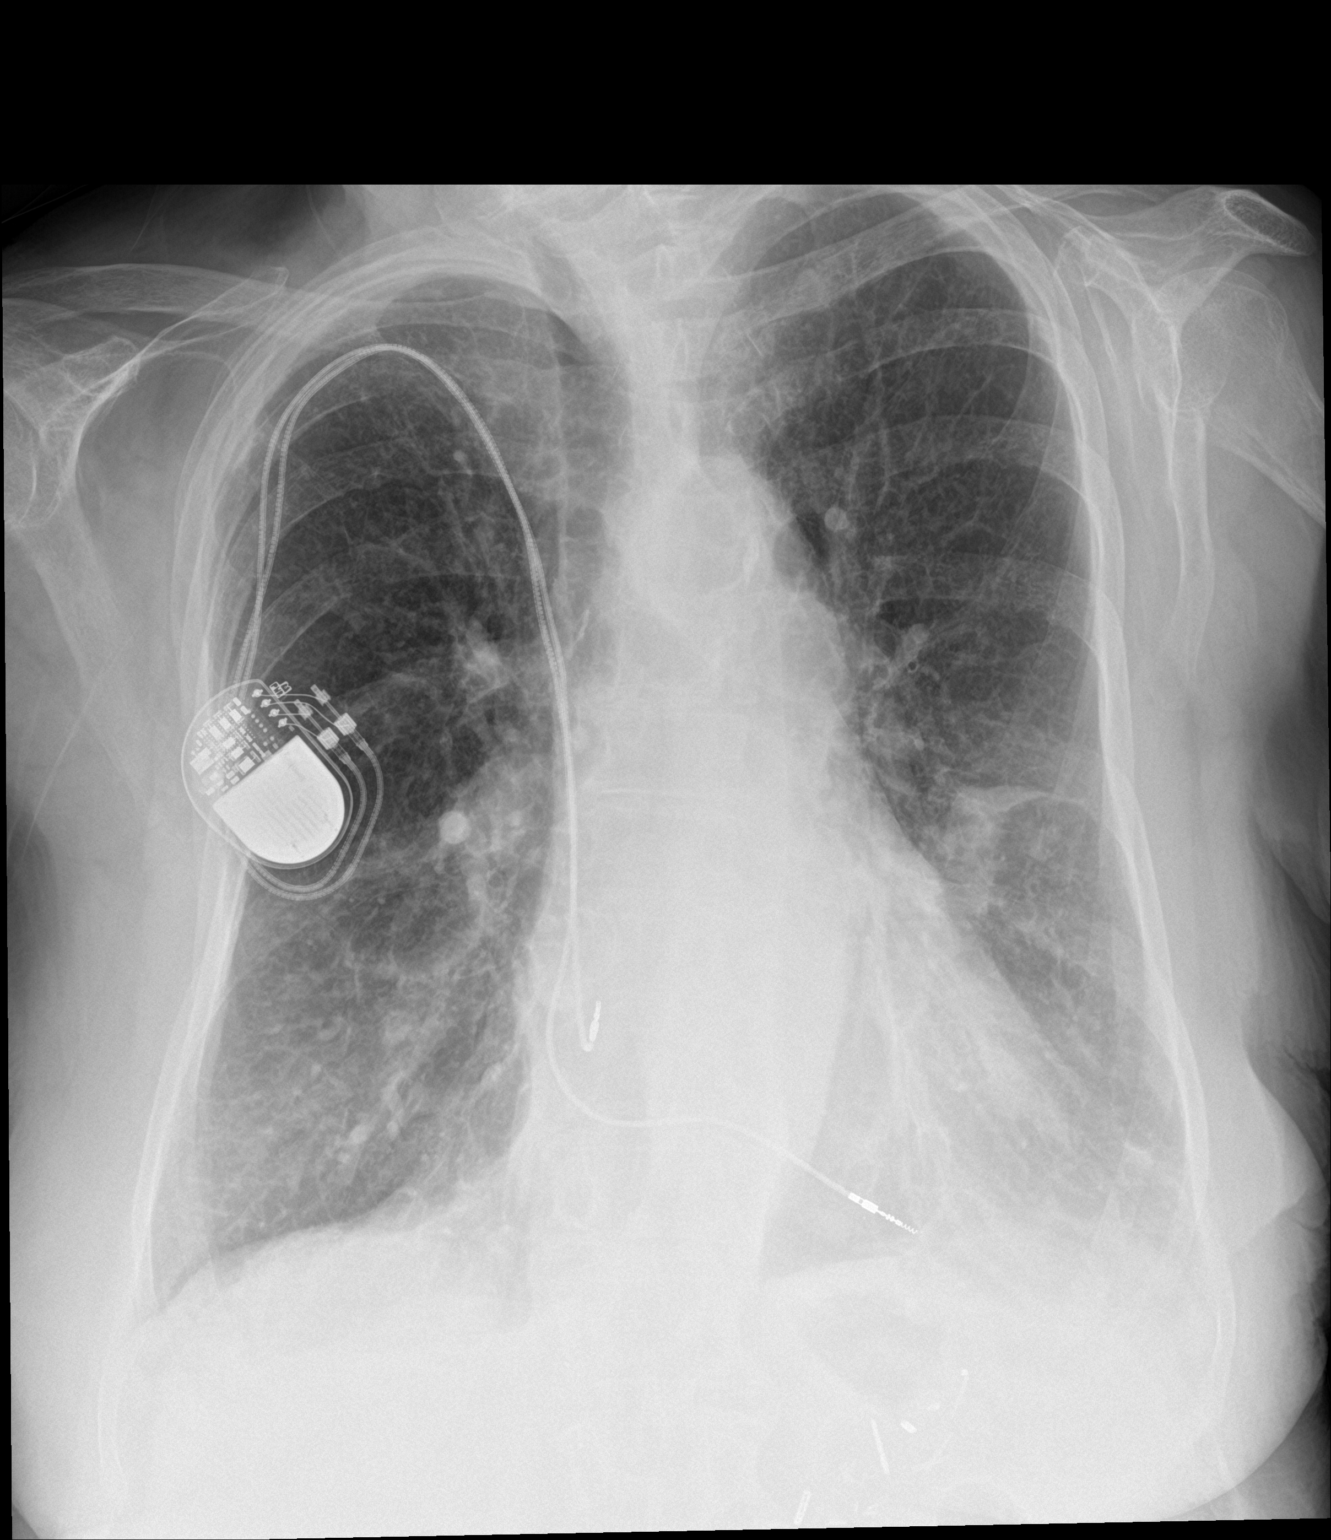

[chest lat]
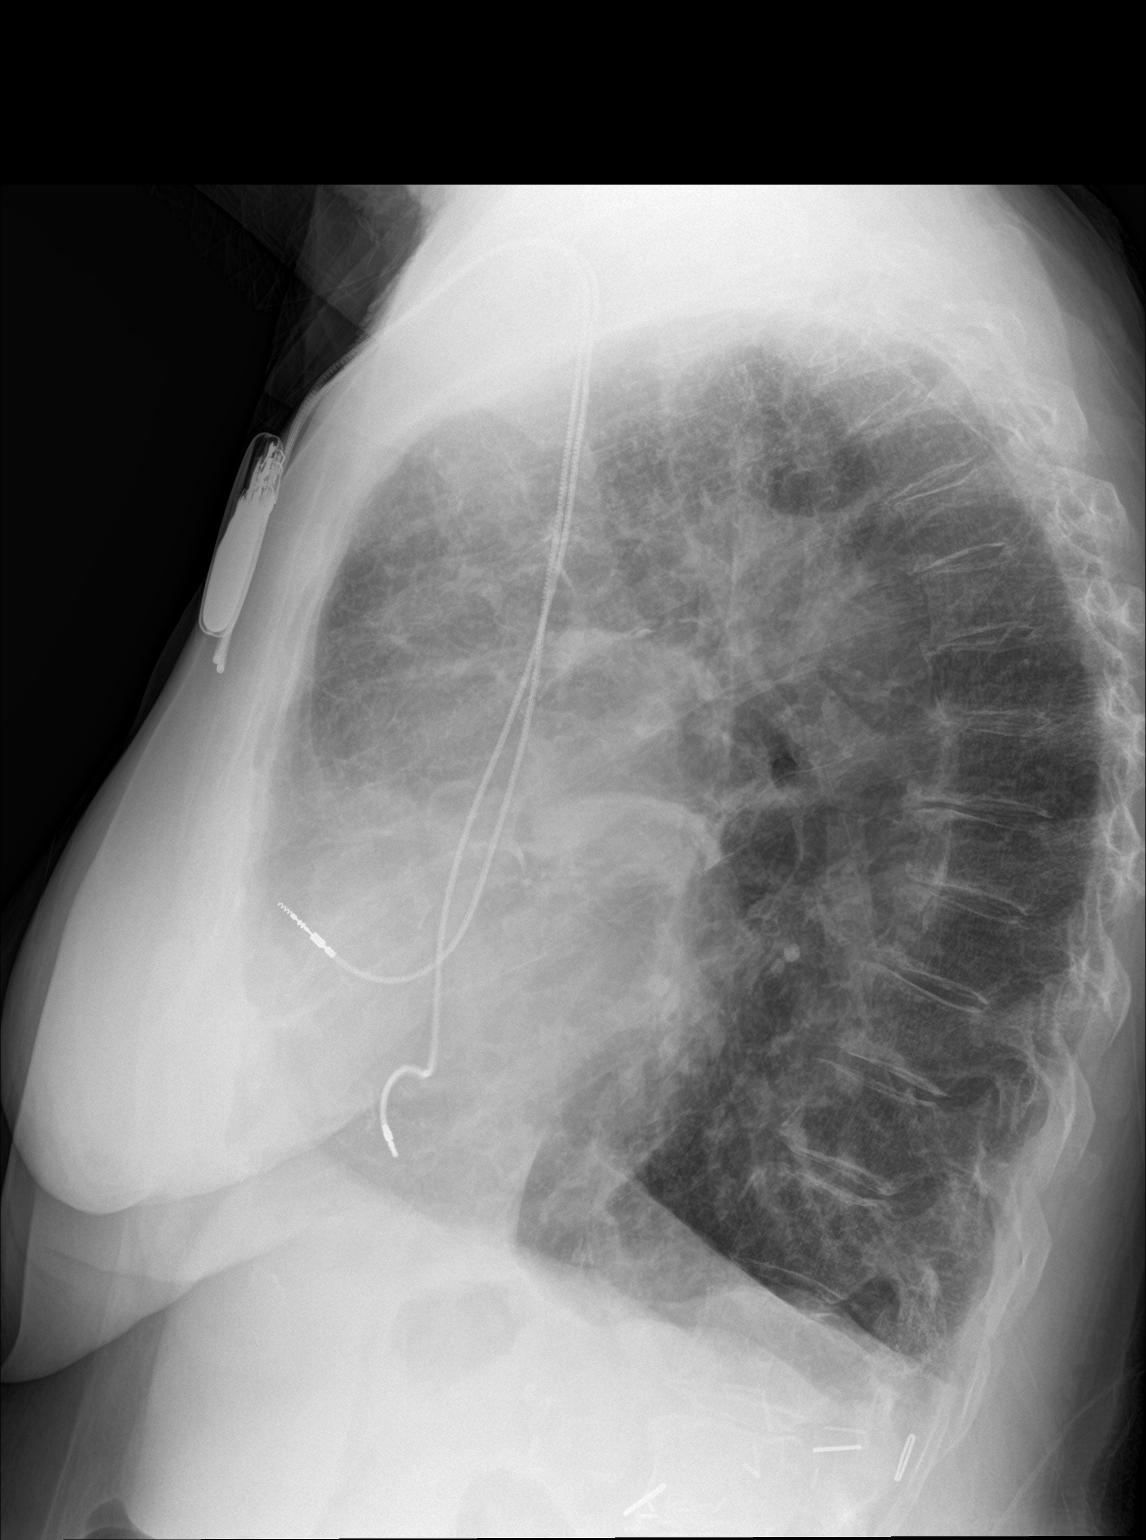

[2 of 2 positions shown; findings below may reference images not displayed]

FINDINGS: The focal opacity in the left mid lung, just lateral to the hilum,
has been present since 1408 consistent with a chronic etiology,
likely scarring. There is atelectasis or scar in the left
retrocardiac region/base, unchanged. No pneumothorax. Stable
pacemaker. Stable cardiomediastinal silhouette. No other interval
changes or acute abnormalities.
IMPRESSION: No acute change.

## 2017-01-10 ENCOUNTER — Ambulatory Visit (HOSPITAL_COMMUNITY)
Admission: RE | Admit: 2017-01-10 | Discharge: 2017-01-10 | Disposition: A | Discharge status: Home / Self Care | Payer: Medicare Other | Source: Ambulatory Visit | Attending: Family Medicine | Admitting: Family Medicine

## 2017-01-10 ENCOUNTER — Encounter: Payer: Self-pay | Admitting: Family Medicine

## 2017-01-10 ENCOUNTER — Ambulatory Visit (INDEPENDENT_AMBULATORY_CARE_PROVIDER_SITE_OTHER): Payer: Medicare Other | Admitting: Family Medicine

## 2017-01-10 ENCOUNTER — Other Ambulatory Visit: Payer: Self-pay

## 2017-01-10 VITALS — BP 110/64 | HR 105 | Temp 97.7°F | Ht 65.0 in | Wt 175.0 lb

## 2017-01-10 DIAGNOSIS — R600 Localized edema: Secondary | ICD-10-CM

## 2017-01-10 DIAGNOSIS — I480 Paroxysmal atrial fibrillation: Secondary | ICD-10-CM

## 2017-01-10 DIAGNOSIS — J449 Chronic obstructive pulmonary disease, unspecified: Secondary | ICD-10-CM

## 2017-01-10 DIAGNOSIS — Z9861 Coronary angioplasty status: Secondary | ICD-10-CM

## 2017-01-10 DIAGNOSIS — I454 Nonspecific intraventricular block: Secondary | ICD-10-CM | POA: Insufficient documentation

## 2017-01-10 DIAGNOSIS — I2510 Atherosclerotic heart disease of native coronary artery without angina pectoris: Secondary | ICD-10-CM

## 2017-01-10 DIAGNOSIS — I5032 Chronic diastolic (congestive) heart failure: Secondary | ICD-10-CM | POA: Diagnosis not present

## 2017-01-10 DIAGNOSIS — R0609 Other forms of dyspnea: Secondary | ICD-10-CM

## 2017-01-10 DIAGNOSIS — I493 Ventricular premature depolarization: Secondary | ICD-10-CM | POA: Diagnosis not present

## 2017-01-10 NOTE — Assessment & Plan Note (Addendum)
Stable Difficulty affording Trelegy and Apixaban. Will refer to [facility] to see if they can explore resources for reduction in cost of these two medications.

## 2017-01-10 NOTE — Assessment & Plan Note (Signed)
Return of atrial fibrillation of uncertain duration.  In adequate rate control with metoprolol succinate 25 mg daily.  [name] has metoprolol tartrate 25 mg prn for episodes of palpitations / rapid heart rate. She has not had to take the breakthrough metoprolol. Taking Apixaban as prescribed.  Uncertain if this return of paroxysmal Afib has role in bilateral ankle edema.  Does not appear to been limiting her usual active social life.   Plan Continue metoprolol succinate 25 mg daily with prn tartrate Continue Apixaban Monitor for impairment of patient impairment of daily life of patient.

## 2017-01-10 NOTE — Assessment & Plan Note (Signed)
Leg Edema - Ddx: venous insufficiency,Left Heart Failure, Renal failure, nephrotic syndrome - Treatment recommendations: support stockings, Lasix at 40 mg appears to be effective dose.  Await SCr.  - Diagnostic and Monitoring therapy effectiveness and adverse effects plans: LABS: CMET, CBC, BNP; Cardiopulmonary testing: If BNP above patient's usual baseline level, then reconsult with cardiology;

## 2017-01-10 NOTE — Progress Notes (Signed)
[name] is accompanied by daughter Sources of clinical information for visit is/are patient, relative(s) and past medical records. Nursing assessment for this office visit was reviewed with the patient for accuracy and revision.  Depression screen PHQ 2/9 01/10/2017  Decreased Interest 0  Down, Depressed, Hopeless 0  PHQ - 2 Score 0  Some recent data might be hidden   Fall Risk  01/10/2017 10/27/2016 09/22/2016 09/22/2016 08/11/2016  Falls in the past year? Yes Yes No Yes Yes  Comment - - - - -  Number falls in past yr: 2 or more 2 or more - 1 1  Injury with Fall? Yes No - No No  Comment - - - - -  Risk Factor Category  High Fall Risk - - - -  Risk for fall due to : - - - - -  Follow up - - - - -     Leg edema - Location: Bilateral, - Level:  feet  and ankles, Left > Right - Onset: gradual 6 weeks - Progression: gradually worsening - Treatment: Diuretic medication Lasix 20 to 40 mg use "very occasionally: Greatly improved                       Elevation: at night, Moderately improved   during day with elevation and Minimally improved overnight in bed            Compression with not using           Low Salt Diet no limitation  - Leg dependency during day: No, patient remains active - Recent IVFs: no    - PMH:   Heart Failure:Grade Two DD TTE 2016 Last EF% = 55-60 on 2016   Liver Disease: no  Advanced Renal Disease: Solitary kidney, eGFR 28 (10/27/16)  Protein Malnutrition: no  Hypoalbuminemia:no, [place] 3.9 (04/2016)  Chronic Venous Insufficiency: yes    History of chronic leg edema:yes, Laterality?  Bilateral  T  Thyroid disease: no, TSH 3.12 (04/2016)  Anemia:Yes, 10.8 (10/27/16) Normocytic, N RDW   - Associated Symptoms:   Dyspnea: no  Orthopnea:no  DOE: Baseline, has to take breaks on level ground  Abdominal swelling:no    Well's DVT Criteria: Malignancy, Recent Immobilization, Recent bedridden or  major surgery, Entire leg swelling, one calf swollen more (>3cm)  than other, Hx  of prior DVT, Varicose Veins, or Other more likely diagnosis than DVT? no    Medications  Is patient on DHP CCB, Hydralazine, Pregabalin, Megace, Thiazolidinedione,  NSAID, Systemic Corticosteroid or Estrogen? no  Recent weight trends:  Filed Weights   01/10/17 0901  Weight: 175 lb (79.4 kg)    Most Recent Labs             BMP Latest Ref Rng & Units 10/27/2016 09/22/2016 08/11/2016  Glucose 65 - 99 mg/dL 87 89 95  BUN 10 - 36 mg/dL 42(H) 26 40(H)  Creatinine 0.57 - 1.00 mg/dL 1.63(H) 1.37(H) 1.63(H)  BUN/Creat Ratio 12 - _0 Sodium 134 - 144 mmol/L 143 144 141  Potassium 3.5 - 5.2 mmol/L 5.2 5.2 4.4  Chloride 96 - 106 mmol/L 104 105 101  CO2 20 - 29 mmol/L _1 Calcium 8.7 - 10.3 mg/dL 9.3 9.3 9.2                 CMP Latest Ref Rng & Units 10/27/2016 09/22/2016 08/11/2016  Glucose 65 - 99 mg/dL 87 89 95  BUN  10 - 36 mg/dL 42(H) 26 40(H)  Creatinine 0.57 - 1.00 mg/dL 1.63(H) 1.37(H) 1.63(H)  Sodium 134 - 144 mmol/L 143 144 141  Potassium 3.5 - 5.2 mmol/L 5.2 5.2 4.4  Chloride 96 - 106 mmol/L 104 105 101  CO2 20 - 29 mmol/L _0 Calcium 8.7 - 10.3 mg/dL 9.3 9.3 9.2  Total Protein 6.1 - 8.1 g/dL - - -  Total Bilirubin 0.2 - 1.2 mg/dL - - -  Alkaline Phos 33 - 130 U/L - - -  AST 10 - 35 U/L - - -  ALT 6 - 29 U/L - - -                Lab Results  Component Value Date   TSH 3.12 04/28/2016     BNP (last 3 results)   Recent Labs    01/29/16 0856 04/28/16 1001  BNP 550.2* 384.6*     ProBNP (last 3 results)  No results for input(s): PROBNP in the last 8760 hours.  ROS Denies Fever/Chills Denies Pain in Legs Denies Leg Rash Denies Itching Legs  Physical Exam Vitals:   01/10/17 0901  BP: 110/64  Pulse: (!) 105  Temp: 97.7 F (36.5 C)  SpO2: 95%  Ambulation: 150 ft SaO2 94% on RA, Peak pulse 115 bpm Wt Readings from Last 3 Encounters:  01/10/17 175 lb (79.4 kg)  12/19/16 179 lb 2 oz (81.3 kg)  10/27/16 172 lb (78 kg)   08/11/16 160 lb VS reviewed GEN: Alert, Cooperative, Groomed, NAD COR: irr irr, No M/G/R,  LUNGS: BCTA, No Acc mm use, speaking in full sentences EXT: bilateral pedal and ankle edema +1 to +2, Left > Right  SKIN: No lrahses Neuro: Oriented to person, place, and time; Repeating same information during interview Gait: Normal speed, slight imbalance when talking while walking, Step through +,  Psych: Normal affect/thought/speech/language  EKG: Atrial Fibrillation with an ventricular paced beat. LBBB, Q V1,V2. Compared 09/06/16 EKG, atrial fibrillation is new

## 2017-01-10 NOTE — Patient Instructions (Signed)
We are checking your liver, kidney, electrolytes, heart function today with your blood work.   Hold off taking any Lasix until we see how well your kidneys are doing.   Your Atrial fibrillation is active again.  Keep taking your metoprolol as it seems to be controlling your heart rate well.  Keep taking the Apixaban (Eliquis) blood thinner to prevent strokes from the Atrial Fibrillation.   Consider investing in a couple pairs of Over-theCounter Graduated Compression Stocking called [employer].  These type of stockings are tighter at the ankles then gradually become less tight as they go up the leg.  They are designed to help squeeze the fluid out of the legs and ankles back into the circulation where the fluid belongs.   If you shop around, either on the Online or at [place], you should be able to purchase a pair for about $20.  You do not need to have special fittings for this level of stocking compression, you cmay buy them straight off the store shelf or Online.   The graduated compression stockings come in a variety of style and colors now, so fashion should not get in the way of wearing them.  Knee high compression stockings would be the right length for you.  This degree of compression is not to difficult to pull on and off.  They are suprisingly comfortable.    I have had the most experience with the Graduated Compression Stockings made by the [place] called [employer], but other manufacturers of compression stockings will likely also provide a stocking that compresses and is comfortable.

## 2017-01-11 LAB — CMP14+EGFR
ALK PHOS: 76 IU/L (ref 39–117)
ALT: 11 IU/L (ref 0–32)
AST: 13 IU/L (ref 0–40)
Albumin/Globulin Ratio: 1.7 (ref 1.2–2.2)
Albumin: 3.9 g/dL (ref 3.2–4.6)
BUN/Creatinine Ratio: 23 (ref 12–28)
BUN: 38 mg/dL — ABNORMAL HIGH (ref 10–36)
Bilirubin Total: 0.5 mg/dL (ref 0.0–1.2)
CO2: 26 mmol/L (ref 20–29)
CREATININE: 1.65 mg/dL — AB (ref 0.57–1.00)
Calcium: 8.8 mg/dL (ref 8.7–10.3)
Chloride: 103 mmol/L (ref 96–106)
GFR calc Af Amer: 31 mL/min/{1.73_m2} — ABNORMAL LOW (ref >59)
GFR calc non Af Amer: 27 mL/min/{1.73_m2} — ABNORMAL LOW (ref >59)
GLOBULIN, TOTAL: 2.3 g/dL (ref 1.5–4.5)
Glucose: 82 mg/dL (ref 65–99)
POTASSIUM: 5 mmol/L (ref 3.5–5.2)
SODIUM: 144 mmol/L (ref 134–144)
Total Protein: 6.2 g/dL (ref 6.0–8.5)

## 2017-01-11 LAB — CBC
HEMATOCRIT: 34.3 % (ref 34.0–46.6)
Hemoglobin: 11.2 g/dL (ref 11.1–15.9)
MCH: 29.7 pg (ref 26.6–33.0)
MCHC: 32.7 g/dL (ref 31.5–35.7)
MCV: 91 fL (ref 79–97)
PLATELETS: 227 10*3/uL (ref 150–379)
RBC: 3.77 x10E6/uL (ref 3.77–5.28)
RDW: 14 % (ref 12.3–15.4)
WBC: 5.8 10*3/uL (ref 3.4–10.8)

## 2017-01-11 LAB — BRAIN NATRIURETIC PEPTIDE: BNP: 462.4 pg/mL — AB (ref 0.0–100.0)

## 2017-01-12 ENCOUNTER — Other Ambulatory Visit: Payer: Self-pay

## 2017-01-12 NOTE — Patient Outreach (Signed)
[place] [facility]) Care Management  01/12/2017  [name] Sep 02, 1923 [id number]   TELEPHONE SCREENING Referral date: 01/10/17 Referral source: primary MD  Referral reason: difficulty affording Trelgey inhaler for COPD AND Apixaban for atrial fibrillation Insurance:   Telephone call to patient regarding primary MD referral. HIPAA verified with patient. Patient states she is having financial difficulty due to medical bills. Patient states she is having difficulty affording her inhaler, Trelegy for COPD and Apixaban for atrial fibrillation. Patient states she has been feeling depressed regarding her financial difficulty. RNCM discussed referral to [facility] care management social worker for financial concerns and depression symptoms. Patient refused. Patient states her concern now is being able to afford her medication.  Patient states she has good family support system. Patient states her primary MD follows her closely and monitors her lab regularly because she has one kidney.   Discussed and offered [facility] care management services with patient. Patient verbally agreed to services.  Patient verbally agreed to referral  To pharmacy and  Health coach for disease management follow up for COPD.   PLAN; RNCM will refer patient to health coach and pharmacist.  Quinn Plowman RN,BSN,CCM [facility] Telephonic  [phone]

## 2017-01-16 ENCOUNTER — Other Ambulatory Visit: Payer: Self-pay | Admitting: Pharmacist

## 2017-01-16 NOTE — Patient Outreach (Signed)
[place] [facility]) Care Management  01/16/2017  TYMESHA DITMORE 1924/02/14 [id number]  Patient was referred to [place] for medication patient assistance evaluation.    Unsuccessful phone outreach to patient, HIPAA compliant message left requesting return call.    Plan:  If no return call, will make second phone outreach attempt in the next week.   Karrie Meres, PharmD, [place] [phone]

## 2017-01-17 ENCOUNTER — Other Ambulatory Visit: Payer: Self-pay | Admitting: Pharmacist

## 2017-01-17 NOTE — Patient Outreach (Signed)
[place] [facility]) Care Management  01/17/2017  LERLINE VALDIVIA 10-23-1923 [id number]  Received message from patient returning call from yesterday.  Unsuccessful return call to patient.  HIPAA compliant message left requesting return call.    Patient returned call.  HIPAA details verified and patient agrees to call.    Patient reports medications of concern are Eliquis and Trelegy.  She reports she is in the coverage gap.    Discussed patient assistance:  -[employer]A Extra Help---patient reports income exceeds requirements.  -GlaxoSmith[employer]line patient assistance (Trelegy) ---patient unsure if income meets requirements---she believes she spent required $600 out-of-pocket on prescriptions this calendar year.   [employer] patient assistance (Eliquis)---discussed income requirements and out-of-pocket prescription spend requirements.  Patient unsure if she meets them.    Patient agrees to review her financial status and out-of-pocket prescription spend costs.  She is aware application to manufacturer patient assistance programs are evaluated by individual patient assistance program and eligibility if approved, would last through end of current calendar year.   Plan:  Will place follow-up call to patient next week to see if she feels she may meet manufacturer patient assistance program requirements.    Karrie Meres, PharmD, [place] [phone]

## 2017-01-18 ENCOUNTER — Other Ambulatory Visit: Payer: Self-pay

## 2017-01-18 DIAGNOSIS — H353211 Exudative age-related macular degeneration, right eye, with active choroidal neovascularization: Secondary | ICD-10-CM | POA: Diagnosis not present

## 2017-01-18 NOTE — Patient Outreach (Signed)
[place] [facility]) Care Management  01/18/2017  [name] 11-15-1923 [id number]   1st Telephone call to patient for initial assessment.  No answer. HIPAA compliant voice message left.   Plan:RN Health Coach will make outreach attempt to patient within the month of November.   Lazaro Arms RN, BSN, [place] Direct Dial:  [phone] Fax: [phone]

## 2017-01-20 ENCOUNTER — Ambulatory Visit: Payer: Self-pay | Admitting: Pharmacist

## 2017-01-23 ENCOUNTER — Telehealth: Payer: Self-pay | Admitting: Emergency Medicine

## 2017-01-23 ENCOUNTER — Other Ambulatory Visit: Payer: Self-pay | Admitting: Pharmacist

## 2017-01-23 MED ORDER — FLUTICASONE-UMECLIDIN-VILANT 100-62.5-25 MCG/INH IN AEPB: 1.0000 | INHALATION_SPRAY | Freq: Every day | RESPIRATORY_TRACT | 0 refills | Status: AC

## 2017-01-23 NOTE — Telephone Encounter (Signed)
Patient called and was advised sample of Trelegy is up front for pick up.  She is requesting 2 samples if we have this to last her through December, as this is over $200 and she is in the donut hole.  She would like to pick up tomorrow if possible.  CB is [phone]

## 2017-01-23 NOTE — Telephone Encounter (Signed)
lmtcb x1 for pt.  One sample of Trelegy has been placed up front for pick up. We do not have flonase samples.

## 2017-01-23 NOTE — Patient Outreach (Signed)
[place] [facility]) Care Management  01/23/2017  NAKITA SANTERRE [birth year] [id number]  Unsuccessful phone outreach to follow-up with patient regarding her decision to apply for manufacturer patient assistance or not.    Noted per chart review patient has contacted her pulmonologist office for Trelegy samples.     HIPAA compliant message left requesting return call.   Plan:  Will make second phone outreach in the next week to patient if no return call.   Karrie Meres, PharmD, [place] [phone]

## 2017-01-24 NOTE — Telephone Encounter (Signed)
ATC pt, no answer. Left message for pt to call back.  

## 2017-01-25 ENCOUNTER — Other Ambulatory Visit: Payer: Self-pay

## 2017-01-25 ENCOUNTER — Other Ambulatory Visit: Payer: Self-pay | Admitting: Pharmacist

## 2017-01-25 NOTE — Patient Outreach (Signed)
[facility] [facility]) Care Management  01/25/2017  [name] Nov 03, 1923 [id number]  Received message back from patient.  Successful phone outreach to patient regarding medication assistance needs.    Patient reports she is getting samples of Trelegy from pulmonologist.  She reports she spoke with her daughter and her income exceeds requirements for [employer] patient assistance (Trelegy) or [place] patient assistance (Eliquis).    She appreciates the call and understands it will be a new plan year on 02/28/17.    Plan:   Will close pharmacy case as unfortunately, patient reported income exceeds manufacturer patient assistance requirements.   Will message Dr McDiarmid to make him aware.   Patient aware she can contact [facility] Pharmacist if new needs arise.   Karrie Meres, PharmD, [place] [phone]

## 2017-01-25 NOTE — Telephone Encounter (Signed)
I left another sample up front. Pt daughter notified. Nothing further is needed.

## 2017-01-25 NOTE — Telephone Encounter (Signed)
Pt calling to ask if she can please get two samples of Trelegy to get her through December.  She did pick up 1, but could really use 2 if at all possible.  She can be reached at her daughter's [phone]

## 2017-01-25 NOTE — Patient Outreach (Signed)
[place] [facility]) Care Management  01/25/2017  EARL ZELLMER 1923/10/13 [id number]       1st  Telephone call to patient for Initial assessment. HIPAA verified.  Discussed and offered [facility] care management services. Patient verbally agreed to services.  The patient was at her daughter's home helping her and asked if I could call her back.  The patient stated that she has her cell phone with her and forgot her charger and could talk better at her home.   Plan: RN Health Coach will make outreach attempt to the patient in the month of December.  Lazaro Arms RN, BSN, [place] Direct Dial:  [phone] Fax: [phone]

## 2017-01-25 NOTE — Telephone Encounter (Signed)
Lmtcb x1 for pt.

## 2017-01-30 ENCOUNTER — Other Ambulatory Visit: Payer: Self-pay | Admitting: Emergency Medicine

## 2017-01-30 ENCOUNTER — Other Ambulatory Visit: Payer: Self-pay

## 2017-01-30 NOTE — Patient Outreach (Signed)
[place] [facility]) Care Management  01/30/2017  [name] 04-13-1923 [id number]   2nd telephone call placed to patient for initial assessment. No answer. HIPAA compliant voice message left with contact information.  Plan RN [facility] will make outreach attempt to the patient within three business days.  Lazaro Arms RN, BSN, [place] Direct Dial:  [phone] Fax: [phone]

## 2017-01-31 ENCOUNTER — Other Ambulatory Visit: Payer: Self-pay

## 2017-01-31 NOTE — Patient Outreach (Signed)
[facility]) Care Management  [place]  01/31/2017   [name] [birth year] [id number]  Subjective: Telephone call placed to patient for an Initial assessment. HIPAA verified. Discussed and offered [facility] care management services. Patient verbally agreed to services.   The patient lives with her daughter [name].  The daughter is there part time. The patient can perform her ADLS and IADLS with assist. She drives herself to her appointments. The patient states that she has a walker, shower chair, and scales in the home.  She weighs herself daily but does not keep a record.The patient is adherent with her medications. Her primary care is Dr. McDiarmid and Pulmonologist Dr. Lamonte Sakai. The patient states that she has a HPOA and living will and will discuss with the PCP about a copy.   Objective:   Encounter Medications:  Outpatient Encounter Medications as of 01/31/2017  Medication Sig  . aspirin EC 81 MG tablet Take 81 mg by mouth daily.  . Cholecalciferol 1000 units tablet Take 1 tablet by mouth daily.   Marland Kitchen ELIQUIS 2.5 MG TABS tablet TAKE 1 TABLET BY MOUTH TWICE DAILY  . fluticasone (FLONASE) 50 MCG/ACT nasal spray SHAKE LIQUID AND USE 2 SPRAYS IN EACH NOSTRIL DAILY  . Fluticasone-Umeclidin-Vilant (TRELEGY ELLIPTA) 100-62.5-25 MCG/INH AEPB Inhale 1 Dose into the lungs daily.  . furosemide (LASIX) 40 MG tablet Take 1 tablet (40 mg total) by mouth as needed.  . metoprolol succinate (TOPROL-XL) 25 MG 24 hr tablet TAKE 1 TABLET(25 MG) BY MOUTH DAILY  . mirabegron ER (MYRBETRIQ) 25 MG TB24 tablet Take 1 tablet (25 mg total) by mouth daily.  . nitroGLYCERIN (NITROSTAT) 0.4 MG SL tablet Place 1 tablet (0.4 mg total) under the tongue every 5 (five) minutes as needed for chest pain (x 3 tabs).  Marland Kitchen omeprazole (PRILOSEC) 20 MG capsule TAKE 1 CAPSULE(20 MG) BY MOUTH DAILY  . traMADol (ULTRAM) 50 MG tablet TAKE 1 TABLET BY MOUTH EVERY 6 HOURS AS NEEDED FOR PAIN  . vitamin B-12  (CYANOCOBALAMIN) 1000 MCG tablet Take 1,000 mcg by mouth daily.  Marland Kitchen albuterol (VENTOLIN HFA) 108 (90 Base) MCG/ACT inhaler For 5 days, then every 6 hours as needed for cough or shortness of breath or wheezing. (Patient not taking: Reported on 01/31/2017)  . atorvastatin (LIPITOR) 20 MG tablet TAKE 1 TABLET BY MOUTH DAILY (Patient not taking: Reported on 01/31/2017)  . calcium carbonate (OSCAL) 1500 (600 Ca) MG TABS tablet Take 600 mg of elemental calcium by mouth 2 (two) times daily with a meal.  . FIBER DIET TABS Take 2 tablets by mouth daily.  Marland Kitchen levofloxacin (LEVAQUIN) 500 MG tablet Take 1 tablet (500 mg total) by mouth daily. (Patient not taking: Reported on 01/31/2017)  . metoprolol tartrate (LOPRESSOR) 25 MG tablet Take 1 tablet (25 mg total) by mouth as needed.  . predniSONE (DELTASONE) 10 MG tablet 3 tabs x 3 days, 2 tabs x 3 days, 1 tab x 3 days then stop (Patient not taking: Reported on 01/31/2017)  . predniSONE (DELTASONE) 20 MG tablet Take 2 tablets (40 mg total) by mouth daily. Take for 5 days (Patient not taking: Reported on 01/31/2017)  . SENNOSIDES PO Take 2 tablets by mouth daily.   No facility-administered encounter medications on file as of 01/31/2017.     Functional Status:  In your present state of health, do you have any difficulty performing the following activities: 09/22/2016  Hearing? Y  Vision? N  Difficulty concentrating or making decisions? N  Walking or climbing stairs? Y  Dressing or bathing? N  Doing errands, shopping? N  Preparing Food and eating ? N  Using the Toilet? N  In the past six months, have you accidently leaked urine? N  Do you have problems with loss of bowel control? N  Managing your Medications? N  Managing your Finances? N  Housekeeping or managing your Housekeeping? Y  Some recent data might be hidden    Fall/Depression Screening: Fall Risk  01/31/2017 01/10/2017 10/27/2016  Falls in the past year? No Yes Yes  Comment - - -  Number falls in  past yr: - 2 or more 2 or more  Injury with Fall? - Yes No  Comment - - -  Risk Factor Category  High Fall Risk High Fall Risk -  Risk for fall due to : - - -  Follow up - - -   PHQ 2/9 Scores 01/12/2017 01/10/2017 10/27/2016 09/22/2016 09/22/2016 08/11/2016 06/23/2016  PHQ - 2 Score 4 0 0 0 0 0 0  PHQ- 9 Score 6 - - - - - -    Assessment: Patient will benefit from health coach outreach for disease management and support.   THN CM Care Plan Problem One     Most Recent Value  Care Plan Problem One  knowledge deficit  Role Documenting the Problem One  Health Coach  Care Plan for Problem One  Active  THN Long Term Goal   in 90 days the patient will not have any hospital admissions  [facility] Long Term Goal Start Date  01/31/17  Interventions for Problem One Long Term Goal  RN [facility] will contact and assess patient's progress and provide her with educational material  THN CM Short Term Goal #1   In 30 days the patient will verbalize that she is keeping a record of her weights  THN CM Short Term Goal #1 Start Date  01/31/17  Interventions for Short Term Goal #1  RN [facility] discussed the benefits of keep a record  THN CM Short Term Goal #2   In 30 days the patient will verbalize 2 symptoms and action in the red zone of copd action plan  THN CM Short Term Goal #2 Start Date  01/31/17  Interventions for Short Term Goal #2  RN [facility] will send the patient the COPD action plan      Plan: RN Health Coach will provide ongoing education for patient on COPD through phone calls and sending printed information to patient for further discussion.  RN Health Coach will send welcome packet with consent to patient as well as printed information on COPD.  RN Health Coach will send initial barriers letter, assessment, and care plan to primary care physician. RN Health Coach will contact patient in the month of January and patient agrees to next outreach.  Lazaro Arms RN, BSN, [place] Direct Dial:  [phone] Fax: [phone]

## 2017-02-02 ENCOUNTER — Ambulatory Visit: Payer: Self-pay

## 2017-02-15 ENCOUNTER — Other Ambulatory Visit: Payer: Self-pay | Admitting: Family Medicine

## 2017-02-16 ENCOUNTER — Telehealth: Payer: Self-pay | Admitting: Emergency Medicine

## 2017-02-16 NOTE — Telephone Encounter (Signed)
Spoke with pt. She is needing samples of Trelegy. Samples have been placed up front for pick up. Nothing further was needed.

## 2017-02-23 DIAGNOSIS — H353211 Exudative age-related macular degeneration, right eye, with active choroidal neovascularization: Secondary | ICD-10-CM | POA: Diagnosis not present

## 2017-02-23 DIAGNOSIS — H353221 Exudative age-related macular degeneration, left eye, with active choroidal neovascularization: Secondary | ICD-10-CM | POA: Diagnosis not present

## 2017-03-01 ENCOUNTER — Telehealth: Payer: Self-pay | Admitting: Family Medicine

## 2017-03-01 NOTE — Telephone Encounter (Signed)
Pt hasnt been feeling good. She is dizzy.  She is concerned if she is taking too much or not enough.  There are no appts until Feb 7.  She wonders if dr Wendy Poet could work her in.

## 2017-03-01 NOTE — Telephone Encounter (Signed)
Will forward to MD to advise. Emiya Loomer,CMA  

## 2017-03-02 DIAGNOSIS — H353221 Exudative age-related macular degeneration, left eye, with active choroidal neovascularization: Secondary | ICD-10-CM | POA: Diagnosis not present

## 2017-03-03 NOTE — Telephone Encounter (Signed)
Could you find a place to double book pt with me in near future.  Thank you.

## 2017-03-03 NOTE — Telephone Encounter (Signed)
Patient scheduled for 03/09/17 at 11:20am with PCP. Jazmin Hartsell,CMA

## 2017-03-05 ENCOUNTER — Other Ambulatory Visit: Payer: Self-pay | Admitting: Family Medicine

## 2017-03-05 DIAGNOSIS — K219 Gastro-esophageal reflux disease without esophagitis: Secondary | ICD-10-CM

## 2017-03-07 ENCOUNTER — Other Ambulatory Visit: Payer: Self-pay

## 2017-03-07 NOTE — Patient Outreach (Signed)
[facility]) Care Management  03/07/2017  HAMNA ASA [birth year] [id number]   Telephone call placed to the patient for monthly assessment. No answer. HIPAA compliant voicemail left with contact information.  Plan RN Health Coach will contact the patient in the month of February.  Lazaro Arms RN, BSN, [place] Direct Dial:  [phone] Fax: [phone]

## 2017-03-09 ENCOUNTER — Other Ambulatory Visit: Payer: Self-pay

## 2017-03-09 ENCOUNTER — Ambulatory Visit (INDEPENDENT_AMBULATORY_CARE_PROVIDER_SITE_OTHER): Payer: Medicare Other | Admitting: Family Medicine

## 2017-03-09 ENCOUNTER — Encounter: Payer: Self-pay | Admitting: Family Medicine

## 2017-03-09 VITALS — BP 134/78 | HR 123 | Temp 98.0°F | Ht 65.0 in | Wt 176.0 lb

## 2017-03-09 DIAGNOSIS — I495 Sick sinus syndrome: Secondary | ICD-10-CM | POA: Diagnosis not present

## 2017-03-09 DIAGNOSIS — J449 Chronic obstructive pulmonary disease, unspecified: Secondary | ICD-10-CM

## 2017-03-09 DIAGNOSIS — D500 Iron deficiency anemia secondary to blood loss (chronic): Secondary | ICD-10-CM

## 2017-03-09 DIAGNOSIS — D509 Iron deficiency anemia, unspecified: Secondary | ICD-10-CM

## 2017-03-09 DIAGNOSIS — N183 Chronic kidney disease, stage 3 unspecified: Secondary | ICD-10-CM

## 2017-03-09 DIAGNOSIS — N184 Chronic kidney disease, stage 4 (severe): Secondary | ICD-10-CM

## 2017-03-09 NOTE — Patient Instructions (Addendum)
Remember to take some calcium every day (OsCal is a good choice, it is over the counter) Also take Vitamin B12 1000 micrograms a day.   Increase Metoprolol XL 25 mg tablet, two tablets a day to help control your heart rate a little a better.  If you get dizzy with this change, go back to one tablet a day.

## 2017-03-10 ENCOUNTER — Encounter: Payer: Self-pay | Admitting: Family Medicine

## 2017-03-10 LAB — BASIC METABOLIC PANEL
BUN / CREAT RATIO: 18 (ref 12–28)
BUN: 27 mg/dL (ref 10–36)
CO2: 24 mmol/L (ref 20–29)
CREATININE: 1.53 mg/dL — AB (ref 0.57–1.00)
Calcium: 8.9 mg/dL (ref 8.7–10.3)
Chloride: 103 mmol/L (ref 96–106)
GFR, EST AFRICAN AMERICAN: 34 mL/min/{1.73_m2} — AB (ref >59)
GFR, EST NON AFRICAN AMERICAN: 30 mL/min/{1.73_m2} — AB (ref >59)
GLUCOSE: 89 mg/dL (ref 65–99)
Potassium: 4.3 mmol/L (ref 3.5–5.2)
Sodium: 142 mmol/L (ref 134–144)

## 2017-03-10 LAB — CBC
HEMATOCRIT: 35.9 % (ref 34.0–46.6)
HEMOGLOBIN: 11.2 g/dL (ref 11.1–15.9)
MCH: 29 pg (ref 26.6–33.0)
MCHC: 31.2 g/dL — AB (ref 31.5–35.7)
MCV: 93 fL (ref 79–97)
Platelets: 229 10*3/uL (ref 150–379)
RBC: 3.86 x10E6/uL (ref 3.77–5.28)
RDW: 14.8 % (ref 12.3–15.4)
WBC: 9 10*3/uL (ref 3.4–10.8)

## 2017-03-10 NOTE — Assessment & Plan Note (Signed)
Established problem. Stable. Continue current therapy  

## 2017-03-10 NOTE — Assessment & Plan Note (Signed)
Asymptomatic Surveillance CBC

## 2017-03-10 NOTE — Progress Notes (Signed)
Subjective:    Patient ID: [name], female    DOB: Jul 14, 1923, 84 y.o.   MRN: [id number] [name] is alone Sources of clinical information for visit is/are patient and past medical records. Nursing assessment for this office visit was reviewed with the patient for accuracy and revision.  Previous Report(s) Reviewed: historical medical records  Depression screen [facility] 2/9 01/12/2017  Decreased Interest 1  Down, Depressed, Hopeless 3  PHQ - 2 Score 4  Altered sleeping 0  Tired, decreased energy 1  Change in appetite 0  Feeling bad or failure about yourself  1  Trouble concentrating 0  Moving slowly or fidgety/restless 0  Suicidal thoughts 0  PHQ-9 Score 6  Difficult doing work/chores Not difficult at all  Some recent data might be hidden   Fall Risk  03/09/2017 01/31/2017 01/10/2017 10/27/2016 09/22/2016  Falls in the past year? Yes No Yes Yes No  Comment - - - - -  Number falls in past yr: 2 or more - 2 or more 2 or more -  Injury with Fall? Yes - Yes No -  Comment - - - - -  Risk Factor Category  High Fall Risk High Fall Risk High Fall Risk - -  Risk for fall due to : - - - - -  Follow up - - - - -    HPI  Problem List Items Addressed This Visit      High   SICK SINUS SYNDROME (Chronic) - (+) heart racing at times - No chest pain - (+) intermittent SOB - No syncope, occasional dizziness with standing if she does not take her time.  - No vertigo. No change in hearing.  - On amiodarone and metoprolol XL   COPD, severe (HCC) (Chronic) - Recent episodes of SOB when going outside in cold.  Responsive to albuterol MDI - Taking daily Trelegy - No cough, no change in sputum    Chronic kidney disease (CKD), stage IV (severe) (HCC) (Chronic) CHRONIC KIDNEY DISEASE STAGE IV  - Onset: several years ago - calculated GFR: 30  - NSAIDS: no - ACEI/ARB: no - Diuretics: Lasix which she takes one dose about once a week for ankle edema - Bone Metabolism: Calcium and Vitamin  D - Potassium: normal - Acidosis: no - PTH: normal - Anemia: mild Hgb 10 - Voiding difficulties: no     Other Visit Diagnoses    Iron deficiency anemia, unspecified iron deficiency anemia type    -  Primary - History of anemia IDA from slow GI bleed, likely AVM per Dr Cristina Gong (GI), not taking iron currently - No new fatigue or DOE   Relevant Orders   CBC (Completed)   Relevant Orders   Basic Metabolic Panel (Completed)     SH: No smoking, Dgt lives with Mrs Spainhower. Remains active in community  Review of Systems See HPI    Objective:   Physical Exam VS reviewed Vitals:   03/09/17 1147  Weight: 176 lb (79.8 kg)  Height: 5\' 5"  (1.651 m)  lying 130/82 110 bpm;  Sit 134/78 123 BPM; Stand 118/84  134 bpm  SaO2 88%  Walking HR by pulse ox 200 ft (90 to 130 bpm) GEN: Alert, Cooperative, Groomed, NAD HEENT: PERRL; EAC bilaterally not occluded, TM's translucent with normal LM, (+) LR;                No cervical LAN, No thyromegaly, No palpable masses COR: RRR, No M/G/R, No  JVD, Normal PMI size and location LUNGS: BCTA, No Acc mm use, speaking in full sentences ABDOMEN: (+)BS, soft, NT, ND, No HSM, No palpable masses GU: Normal Rectal tone, no palpable masses, prostate without hypertrophy/asymmetry/nodularity. Hemoccult negative. EXT: No peripheral leg edema. Feet without deformity or lesions. Palpable bilateral pedal pulses.  SKIN: No lesion nor rashes of face/trunk/extremities Neuro: Oriented to person, place, and time; Strength: 5/5 Bil. UE and LE symmetric; Sensation: Intact grossly to touch all four extremities; Cerebellar: Finger-to-Nose intact, Rhomberg negative DTR: Bilateral Bicep 2+, Bilateral Triceps 2+, Bilateral Knees 2+, Bilateral Ankles 1+ Gait: Normal speed, No significant path deviation, Step through +,  Psych: Normal affect/thought/speech/language    Assessment & Plan:

## 2017-03-10 NOTE — Assessment & Plan Note (Signed)
Established problem Controlled Surveillance Renal panel today

## 2017-03-10 NOTE — Assessment & Plan Note (Signed)
Established problem Uncontrolled Heart rate exceeding 110 bpm consistently with activity on Amiodarone and metoprolol.  Will increase Metoprolol succinate from 25 to 50 mg daily.  Patient taking and tolerating her Eliquis.

## 2017-03-16 ENCOUNTER — Other Ambulatory Visit: Payer: Self-pay | Admitting: Emergency Medicine

## 2017-03-16 ENCOUNTER — Other Ambulatory Visit: Payer: Self-pay | Admitting: Family Medicine

## 2017-03-16 DIAGNOSIS — D225 Melanocytic nevi of trunk: Secondary | ICD-10-CM | POA: Diagnosis not present

## 2017-03-16 DIAGNOSIS — L821 Other seborrheic keratosis: Secondary | ICD-10-CM | POA: Diagnosis not present

## 2017-03-23 DIAGNOSIS — H353221 Exudative age-related macular degeneration, left eye, with active choroidal neovascularization: Secondary | ICD-10-CM | POA: Diagnosis not present

## 2017-03-23 DIAGNOSIS — H353211 Exudative age-related macular degeneration, right eye, with active choroidal neovascularization: Secondary | ICD-10-CM | POA: Diagnosis not present

## 2017-03-30 ENCOUNTER — Encounter: Payer: Self-pay | Admitting: Emergency Medicine

## 2017-03-30 ENCOUNTER — Ambulatory Visit (INDEPENDENT_AMBULATORY_CARE_PROVIDER_SITE_OTHER): Payer: Medicare Other | Admitting: Emergency Medicine

## 2017-03-30 DIAGNOSIS — K219 Gastro-esophageal reflux disease without esophagitis: Secondary | ICD-10-CM | POA: Diagnosis not present

## 2017-03-30 DIAGNOSIS — J449 Chronic obstructive pulmonary disease, unspecified: Secondary | ICD-10-CM | POA: Diagnosis not present

## 2017-03-30 DIAGNOSIS — J302 Other seasonal allergic rhinitis: Secondary | ICD-10-CM | POA: Diagnosis not present

## 2017-03-30 MED ORDER — AZITHROMYCIN 250 MG PO TABS: ORAL_TABLET | ORAL | 0 refills | Status: AC

## 2017-03-30 MED ORDER — PREDNISONE 10 MG PO TABS: ORAL_TABLET | ORAL | 0 refills | Status: DC

## 2017-03-30 NOTE — Progress Notes (Signed)
GERD  Subjective:    Patient ID: [name], female    DOB: 11-10-1923, 84 y.o.   MRN: [id number]  COPD  She complains of shortness of breath. There is no cough or wheezing. Pertinent negatives include no chest pain, ear pain, fever, headaches, postnasal drip, rhinorrhea, sneezing, sore throat or trouble swallowing. Her past medical history is significant for COPD.    ROV 03/30/17 --Very pleasant 84 year old woman with a history of former tobacco, atrial fibrillation with sick sinus syndrome, hypertension, chronic kidney disease, GERD with a hiatal hernia.  We follow her for her history of tobacco use and COPD.  She has been managed on Trelegy.  She uses fluticasone nasal spray for chronic allergic rhinitis, Prilosec 20 mg daily for GERD.  Uses albuterol rarely until the last week. She usually stays with a daughter, but has been at home on her own since last week. She reports an increase in exertional SOB over the last 10 days, increased congestion and cough, productive of greenish phlegm. Unclear whether there was a trigger - no URI sx, etc.    Review of Systems  Constitutional: Negative for fever and unexpected weight change.  HENT: Positive for congestion. Negative for dental problem, ear pain, nosebleeds, postnasal drip, rhinorrhea, sinus pressure, sneezing, sore throat and trouble swallowing.   Eyes: Negative for redness and itching.  Respiratory: Positive for shortness of breath. Negative for cough, chest tightness and wheezing.   Cardiovascular: Positive for palpitations. Negative for chest pain and leg swelling.  Gastrointestinal: Negative for nausea and vomiting.  Genitourinary: Negative for dysuria.  Musculoskeletal: Negative for joint swelling.  Skin: Negative for rash.  Neurological: Negative for headaches.  Hematological: Does not bruise/bleed easily.  Psychiatric/Behavioral: Negative for dysphoric mood. The patient is nervous/anxious.         Objective:   Physical  Exam  Vitals:   03/30/17 1046 03/30/17 1047  BP:  132/88  Pulse:  (!) 109  SpO2:  95%  Weight: 175 lb (79.4 kg)   Height: 5\' 5"  (1.651 m)    Gen: Pleasant, elderly woman in no distress,  normal affect  ENT: No lesions,  mouth clear,  oropharynx clear, no postnasal drip, normal voice.   Neck: No JVD, no stridor  Lungs: No use of accessory muscles, Scattered expiratory wheezes, louder today  Cardiovascular: Irregular, heart sounds normal, no murmur or gallops, no peripheral edema  Musculoskeletal: No deformities, no cyanosis or clubbing  Neuro: alert, non focal  Skin: Warm, no lesions or rashes      Assessment & Plan:  COPD, severe ([place]) With an acute flare, unclear trigger but she suspects that the cold air is a contributor.  Please take azithromycin as directed until completely gone Please take prednisone as directed until completely gone.  Continue Trelegy every day as you have been taking it Keep albuterol available to use 2 puffs if needed for shortness of breath, chest tightness, wheezing. Please call next week to let us know if you are improving. Follow with Dr Lamonte Sakai in 3 months or sooner if you have any problems.  Allergic rhinitis, seasonal Continue fluticasone nasal spray  GERD (gastroesophageal reflux disease) Continue omeprazole as ordered  Baltazar Apo, MD, PhD 03/30/2017, 11:05 AM [facility] [phone] or if no answer [phone]

## 2017-03-30 NOTE — Patient Instructions (Signed)
Please take azithromycin as directed until completely gone Please take prednisone as directed until completely gone.  Continue Trelegy every day as you have been taking it Keep albuterol available to use 2 puffs if needed for shortness of breath, chest tightness, wheezing. Please call next week to let us know if you are improving. Follow with Dr Lamonte Sakai in 3 months or sooner if you have any problems.

## 2017-03-30 NOTE — Assessment & Plan Note (Signed)
Continue omeprazole as ordered 

## 2017-03-30 NOTE — Assessment & Plan Note (Signed)
With an acute flare, unclear trigger but she suspects that the cold air is a contributor.  Please take azithromycin as directed until completely gone Please take prednisone as directed until completely gone.  Continue Trelegy every day as you have been taking it Keep albuterol available to use 2 puffs if needed for shortness of breath, chest tightness, wheezing. Please call next week to let us know if you are improving. Follow with Dr Lamonte Sakai in 3 months or sooner if you have any problems.

## 2017-03-30 NOTE — Assessment & Plan Note (Signed)
Continue fluticasone nasal spray.

## 2017-04-04 ENCOUNTER — Other Ambulatory Visit: Payer: Self-pay

## 2017-04-04 NOTE — Patient Outreach (Signed)
[facility]) Care Management  04/04/2017  SAKIYA STEPKA 08-29-1923 [id number]   1st telephone call for monthly assessment. No answer. HIPAA compliant message left for the patient with contact information.  Plan:  RN Health Coach will make an outreach attempt to the patient in the moth of February.  Lazaro Arms RN, BSN, [place] Direct Dial:  [phone] Fax: [phone]

## 2017-04-06 ENCOUNTER — Other Ambulatory Visit: Payer: Self-pay

## 2017-04-06 ENCOUNTER — Encounter: Payer: Self-pay | Admitting: Family Medicine

## 2017-04-06 ENCOUNTER — Ambulatory Visit (INDEPENDENT_AMBULATORY_CARE_PROVIDER_SITE_OTHER): Payer: Medicare Other | Admitting: Family Medicine

## 2017-04-06 VITALS — BP 128/80 | HR 125 | Temp 98.8°F | Ht 65.0 in | Wt 178.0 lb

## 2017-04-06 DIAGNOSIS — Z905 Acquired absence of kidney: Secondary | ICD-10-CM

## 2017-04-06 DIAGNOSIS — J449 Chronic obstructive pulmonary disease, unspecified: Secondary | ICD-10-CM

## 2017-04-06 DIAGNOSIS — N184 Chronic kidney disease, stage 4 (severe): Secondary | ICD-10-CM | POA: Diagnosis not present

## 2017-04-06 DIAGNOSIS — Z7901 Long term (current) use of anticoagulants: Secondary | ICD-10-CM

## 2017-04-06 DIAGNOSIS — D500 Iron deficiency anemia secondary to blood loss (chronic): Secondary | ICD-10-CM | POA: Diagnosis not present

## 2017-04-06 NOTE — Progress Notes (Signed)
   Subjective:    Patient ID: [name], female    DOB: May 21, 1923, 84 y.o.   MRN: [id number] ARTA STUMP is alone Sources of clinical information for visit is/are patient and past medical records. Nursing assessment for this office visit was reviewed with the patient for accuracy and revision.  Previous Report(s) Reviewed: historical medical records including patient recent ov with Dr Lamonte Sakai on 1/31  Depression screen PHQ 2/9 01/12/2017  Decreased Interest 1  Down, Depressed, Hopeless 3  PHQ - 2 Score 4  Altered sleeping 0  Tired, decreased energy 1  Change in appetite 0  Feeling bad or failure about yourself  1  Trouble concentrating 0  Moving slowly or fidgety/restless 0  Suicidal thoughts 0  PHQ-9 Score 6  Difficult doing work/chores Not difficult at all  Some recent data might be hidden   Fall Risk  04/06/2017 03/09/2017 01/31/2017 01/10/2017 10/27/2016  Falls in the past year? Yes Yes No Yes Yes  Comment - - - - -  Number falls in past yr: 2 or more 2 or more - 2 or more 2 or more  Injury with Fall? Yes Yes - Yes No  Comment - - - - -  Risk Factor Category  High Fall Risk High Fall Risk High Fall Risk High Fall Risk -  Risk for fall due to : - - - - -  Follow up - - - - -    HPI FATIGUE  Onset: since recent AE-COPD managed by Dr Lamonte Sakai 1/31    Fatigue with exertion: mild   Physical limitations: able to perform ADLs and iADLs Primarily motivational fatigue: no, going out to luncheon today  Primarily physical fatigue: yes  Symptoms Fever: no  Night sweats: no  Weight loss: no   Exertional chest pain: no  Dyspnea: yes, having to rest more often during longer walks  Cough: no  Hemoptysis: no  New medications: no  Leg swelling: yes, after taking prednisone for recent AECOPD, has taken total of 60 mg Lasix for leg swelling since 1/31  Orthopnea: no  PND: no  Melena: no  Adenopathy: no  Severe snoring: no  Daytime sleepiness: no  Skin changes: no  Feeling  depressed: no, thinks frequently of her times with her husband; misses him very much, feels supported by family and friends  Anhedonia: no  Altered appetite: no  Poor sleep: yes, intermittent   SH: lives with Dgt, Truman Hayward, who is Flight attendant - gone for 7 days stays for 7 days.  Dgt [name] lives nearby and checks on her mother frequently in person and daily by phone.  Medications were reviewed by bottles and med list amended by Dr Sejal Cofield Review of Systems See HPI    Objective:   Physical Exam VS reviewed GEN: Alert, Cooperative, Groomed, NAD COR: irr irr, No M/G/R, No JVD LUNGS: BCTA, No Acc mm use, speaking in full sentences EXT: +1 ankle edema bilaterally, no skin breakdown,  Feet warm appropriately warm to palp Gait: Normal speed, walking without cane, slight forward flex torso changing center of gravity,  No significant path deviation, Step through +,  Psych: bright affect/thought/reminisent language content        Assessment & Plan:  See problem list

## 2017-04-06 NOTE — Patient Instructions (Addendum)
Your Blood pressure and heart rate and oxygen level are great. Please keep taking your medicines just as you are.  Lets get back to together in 6 weeks.

## 2017-04-07 ENCOUNTER — Ambulatory Visit: Payer: Self-pay

## 2017-04-07 ENCOUNTER — Encounter: Payer: Self-pay | Admitting: Family Medicine

## 2017-04-07 LAB — BASIC METABOLIC PANEL
BUN / CREAT RATIO: 28 (ref 12–28)
BUN: 51 mg/dL — AB (ref 10–36)
CALCIUM: 9 mg/dL (ref 8.7–10.3)
CO2: 24 mmol/L (ref 20–29)
CREATININE: 1.81 mg/dL — AB (ref 0.57–1.00)
Chloride: 103 mmol/L (ref 96–106)
GFR calc non Af Amer: 24 mL/min/{1.73_m2} — ABNORMAL LOW (ref >59)
GFR, EST AFRICAN AMERICAN: 28 mL/min/{1.73_m2} — AB (ref >59)
GLUCOSE: 92 mg/dL (ref 65–99)
Potassium: 4.6 mmol/L (ref 3.5–5.2)
Sodium: 143 mmol/L (ref 134–144)

## 2017-04-07 LAB — CBC
HEMOGLOBIN: 11.4 g/dL (ref 11.1–15.9)
Hematocrit: 36.8 % (ref 34.0–46.6)
MCH: 28.9 pg (ref 26.6–33.0)
MCHC: 31 g/dL — ABNORMAL LOW (ref 31.5–35.7)
MCV: 93 fL (ref 79–97)
Platelets: 287 10*3/uL (ref 150–379)
RBC: 3.94 x10E6/uL (ref 3.77–5.28)
RDW: 14.8 % (ref 12.3–15.4)
WBC: 10.6 10*3/uL (ref 3.4–10.8)

## 2017-04-07 NOTE — Assessment & Plan Note (Addendum)
Established problem that has improved.  Suspect current complaint of fatigue related to being in recovery hase from recent AE-COPD.  Patient is participating in community activities, so she appears to be well on the mend.  AE-COPD from 1/31 tx'd by Dr Lamonte Sakai appears resolved,  Tolerated antibiotics and prednsione with only small ankle edema consequent.   Recommend leg elevation and wearing her support hose.  Will check renal function given solitary kideny, CKD and recent incr use of Lasix.  No change in patient's chronic inh COPD meds

## 2017-04-07 NOTE — Assessment & Plan Note (Signed)
Given complaint of fzatigue , will recheck Hgb

## 2017-04-12 ENCOUNTER — Other Ambulatory Visit: Payer: Self-pay

## 2017-04-12 NOTE — Patient Outreach (Signed)
[facility]) Care Management  04/12/2017  [name] [birth year] [id number]   2 ND telephone call to the patient for monthly assessment. No answer.  HIPAA compliant voicemail left with contact information.  Plan: RN Health Coach will send letter to attempt outreach.  If no response within ten business days will proceed with case closure.   Lazaro Arms RN, BSN, [place] Direct Dial:  [phone] Fax: [phone]

## 2017-04-17 ENCOUNTER — Other Ambulatory Visit: Payer: Self-pay

## 2017-04-17 ENCOUNTER — Telehealth: Payer: Self-pay | Admitting: Emergency Medicine

## 2017-04-17 NOTE — Patient Outreach (Signed)
[facility]) Care Management  [place]  04/17/2017   SHAHD OCCHIPINTI 10-Aug-1923 [id number]  Subjective: Telephone call received from the patient for monthly assessment. HIPAA verified.  The patient states that she is doing fine. She denies any pain or falls. She did state that two weeks ago that she was having problems breathing.  She reached out to her pulmonologist Dr Lamonte Sakai and was put on an antibiotic and prednisone.  RN Health Coach talked with the patient about the importance of calling her physician and letting them know if she is having any problems.  The patient verbalized understanding. The patient states that she is adherent with medications The patient states that she has been weighing herself daily. She is very aware if she has any swelling. She admits that she has gained weight.  She weighs 174 lbs.  She admits that she has been eating more. The patient has an appointment with her pulmonologist at the end of February.    Encounter Medications:  Outpatient Encounter Medications as of 04/17/2017  Medication Sig  . albuterol (VENTOLIN HFA) 108 (90 Base) MCG/ACT inhaler For 5 days, then every 6 hours as needed for cough or shortness of breath or wheezing.  Marland Kitchen amiodarone (PACERONE) 200 MG tablet Take 200 mg by mouth daily.  Marland Kitchen aspirin 81 MG chewable tablet Chew 81 mg by mouth daily.  Marland Kitchen atorvastatin (LIPITOR) 20 MG tablet TAKE 1 TABLET BY MOUTH DAILY  . calcium carbonate (OSCAL) 1500 (600 Ca) MG TABS tablet Take 600 mg of elemental calcium by mouth 2 (two) times daily with a meal.  . Cholecalciferol 1000 units tablet Take 1 tablet by mouth daily.   Marland Kitchen ELIQUIS 2.5 MG TABS tablet TAKE 1 TABLET BY MOUTH TWICE DAILY  . FIBER DIET TABS Take 2 tablets by mouth daily.  . fluticasone (FLONASE) 50 MCG/ACT nasal spray SHAKE LIQUID AND USE 2 SPRAYS IN EACH NOSTRIL DAILY  . Fluticasone-Umeclidin-Vilant (TRELEGY ELLIPTA) 100-62.5-25 MCG/INH AEPB Inhale 1 Dose into the lungs daily.   . furosemide (LASIX) 40 MG tablet Take 1 tablet (40 mg total) by mouth as needed.  . Melatonin 3-2 MG TABS Take by mouth.  . metoprolol succinate (TOPROL-XL) 25 MG 24 hr tablet TAKE 1 TABLET(25 MG) BY MOUTH DAILY  . nitroGLYCERIN (NITROSTAT) 0.4 MG SL tablet Place 1 tablet (0.4 mg total) under the tongue every 5 (five) minutes as needed for chest pain (x 3 tabs).  Marland Kitchen omeprazole (PRILOSEC) 20 MG capsule TAKE 1 CAPSULE(20 MG) BY MOUTH DAILY  . SENNOSIDES PO Take 2 tablets by mouth daily.  . traMADol (ULTRAM) 50 MG tablet TAKE 1 TABLET BY MOUTH EVERY 6 HOURS AS NEEDED FOR PAIN  . vitamin B-12 (CYANOCOBALAMIN) 1000 MCG tablet Take 1,000 mcg by mouth daily.  . predniSONE (DELTASONE) 10 MG tablet Take 4 tablets for 3 days, 3 tablets for 3 days, 2 tablets for 3 days, 1 tablet for 3 days (Patient not taking: Reported on 04/17/2017)   No facility-administered encounter medications on file as of 04/17/2017.     Functional Status:  In your present state of health, do you have any difficulty performing the following activities: 09/22/2016  Hearing? Y  Vision? N  Difficulty concentrating or making decisions? N  Walking or climbing stairs? Y  Dressing or bathing? N  Doing errands, shopping? N  Preparing Food and eating ? N  Using the Toilet? N  In the past six months, have you accidently leaked urine? N  Do  you have problems with loss of bowel control? N  Managing your Medications? N  Managing your Finances? N  Housekeeping or managing your Housekeeping? Y  Some recent data might be hidden    Fall/Depression Screening: Fall Risk  04/17/2017 04/06/2017 03/09/2017  Falls in the past year? No Yes Yes  Comment - - -  Number falls in past yr: - 2 or more 2 or more  Injury with Fall? - Yes Yes  Comment - - -  Risk Factor Category  - High Fall Risk High Fall Risk  Risk for fall due to : - - -  Follow up - - -   PHQ 2/9 Scores 01/12/2017 01/10/2017 10/27/2016 09/22/2016 09/22/2016 08/11/2016 06/23/2016   PHQ - 2 Score 4 0 0 0 0 0 0  PHQ- 9 Score 6 - - - - - -    Assessment: Patient continues to benefit from health coach outreach for disease management and support.   THN CM Care Plan Problem One     Most Recent Value  Care Plan Problem One  knowledge deficit  Role Documenting the Problem One  Health Coach  Care Plan for Problem One  Active  THN Long Term Goal   in 90 days the patient will not have any hospital admissions  [facility] Long Term Goal Start Date  04/19/17  Interventions for Problem One Long Term Goal  RN [facility] discussed with patient her health condition  THN CM Short Term Goal #1   In 30 days the patient will verbalize that she is keeping a record of her weights  THN CM Short Term Goal #1 Start Date  04/19/17  [facility] CM Short Term Goal #1 Met Date  04/19/17  Interventions for Short Term Goal #1  RN [facility] discussed the benefits of keep a record  THN CM Short Term Goal #2   In 30 days the patient will verbalize 2 symptoms and action in the red zone of copd action plan  THN CM Short Term Goal #2 Start Date  04/19/17  [facility] CM Short Term Goal #2 Met Date  04/19/17  Interventions for Short Term Goal #2  RN [facility] will send the patient the COPD action plan       Plan: RN Health Coach will close at next outreach call.           RN Health Coach will contact patient in the month of March and patient agrees to next outreach.   Lazaro Arms RN, BSN, [place] Direct Dial:  [phone] Fax: [phone]

## 2017-04-17 NOTE — Telephone Encounter (Signed)
I have not contacted the pt today. I had contacted the pt last week to check to see how she was doing since her last OV with RB per his instructions. The pt just listened to the message I left. Pt is doing well. She apologized for any inconvenience. Nothing further was needed.

## 2017-04-17 NOTE — Telephone Encounter (Signed)
Ria Comment please advise patient states that you called her and requested she call back. I don't see any recent calls. Thanks.

## 2017-04-20 DIAGNOSIS — H353211 Exudative age-related macular degeneration, right eye, with active choroidal neovascularization: Secondary | ICD-10-CM | POA: Diagnosis not present

## 2017-04-20 DIAGNOSIS — H353221 Exudative age-related macular degeneration, left eye, with active choroidal neovascularization: Secondary | ICD-10-CM | POA: Diagnosis not present

## 2017-04-27 ENCOUNTER — Other Ambulatory Visit: Payer: Self-pay | Admitting: Family Medicine

## 2017-04-27 ENCOUNTER — Other Ambulatory Visit: Payer: Self-pay | Admitting: Emergency Medicine

## 2017-04-27 DIAGNOSIS — H43813 Vitreous degeneration, bilateral: Secondary | ICD-10-CM | POA: Diagnosis not present

## 2017-04-27 DIAGNOSIS — H353211 Exudative age-related macular degeneration, right eye, with active choroidal neovascularization: Secondary | ICD-10-CM | POA: Diagnosis not present

## 2017-04-27 DIAGNOSIS — H353221 Exudative age-related macular degeneration, left eye, with active choroidal neovascularization: Secondary | ICD-10-CM | POA: Diagnosis not present

## 2017-04-27 DIAGNOSIS — J449 Chronic obstructive pulmonary disease, unspecified: Secondary | ICD-10-CM

## 2017-05-09 ENCOUNTER — Telehealth: Payer: Self-pay | Admitting: Family Medicine

## 2017-05-09 NOTE — Telephone Encounter (Signed)
She has appt w/you on 06/08/17 but is having leg swelling and wanted to see if you can work her in any sooner.  She only wants to see you.  thanks

## 2017-05-11 NOTE — Telephone Encounter (Signed)
I will not be available for next two weeks.  If ms [name] needs to be seen, she. Will have to see so one on [facility] or sda

## 2017-05-11 NOTE — Telephone Encounter (Signed)
Spoke with patient and she declined an appointment at this time.  States that her ankles are a little better today and she has no plans the next few days, so she will keep her feet up.  Patient is aware that she needs to call us and make an appointment next week if she doesn't get better. [name],CMA

## 2017-05-18 DIAGNOSIS — H353221 Exudative age-related macular degeneration, left eye, with active choroidal neovascularization: Secondary | ICD-10-CM | POA: Diagnosis not present

## 2017-05-18 DIAGNOSIS — H353211 Exudative age-related macular degeneration, right eye, with active choroidal neovascularization: Secondary | ICD-10-CM | POA: Diagnosis not present

## 2017-05-25 ENCOUNTER — Other Ambulatory Visit: Payer: Self-pay

## 2017-05-25 ENCOUNTER — Other Ambulatory Visit: Payer: Self-pay | Admitting: Interventional Cardiology

## 2017-05-25 NOTE — Patient Outreach (Signed)
[facility]) Care Management  05/25/2017  [name] 10/12/1923 [id number]   1st attempt to outreach the patient for monthly assessment.  No answer.  HIPAA compliant voicemail left with contact information.  Plan:  RN Health Coach will make an outreach attempt to the patient in one business day.  Lazaro Arms RN, BSN, [place] Direct Dial:  [phone]  Fax: [phone]

## 2017-05-26 ENCOUNTER — Other Ambulatory Visit: Payer: Self-pay

## 2017-05-26 NOTE — Patient Outreach (Signed)
[place] [facility]) Care Management  05/26/2017  [name] Nov 23, 1923 [id number]   Unsuccessful attempt to outreach the patient for monthly assessment. No answer HIPAA compliant message left with contact information.  Plan: RN Health Coach will send letter for outreach attempt. RN Health Coach will make 3rd telephone outreach attempt in ten business days. No answer I will proceed with case closure.     Lazaro Arms RN, BSN, [place] Direct Dial:  [phone]  Fax: [phone]

## 2017-05-31 ENCOUNTER — Other Ambulatory Visit: Payer: Self-pay

## 2017-05-31 NOTE — Patient Outreach (Signed)
[facility]) Care Management  [place]  05/31/2017   [name] December 22, 1923 [id number]   Subjective: Telephone call to the patient for monthly assessment. HIPAA verified.  The patient states that she has not been doing well with her breathing.  She states that she has had some swelling in her feet ankles on and off.  The patient states that she is adherent with her medications but is careful about taking her fluid pill because she only has one kidney.  She states that she has been elevating her feet when she sits.  She has two appointments tomorrow one with Dr Assunta Curtis and the other with  Dr Lamonte Sakai.  The patient denies any pain or any falls.  The patient stated that she does not drink a lot of water and she eats out a lot.  RN Health Coach advised the patient to drink more water and to monitor the salt in her food when she eats out.  The patient verbalized understanding.  Encounter Medications:  Outpatient Encounter Medications as of 05/31/2017  Medication Sig  . albuterol (VENTOLIN HFA) 108 (90 Base) MCG/ACT inhaler For 5 days, then every 6 hours as needed for cough or shortness of breath or wheezing.  Marland Kitchen amiodarone (PACERONE) 200 MG tablet Take 200 mg by mouth daily.  Marland Kitchen aspirin 81 MG chewable tablet Chew 81 mg by mouth daily.  Marland Kitchen atorvastatin (LIPITOR) 20 MG tablet TAKE 1 TABLET BY MOUTH DAILY  . calcium carbonate (OSCAL) 1500 (600 Ca) MG TABS tablet Take 600 mg of elemental calcium by mouth 2 (two) times daily with a meal.  . Cholecalciferol 1000 units tablet Take 1 tablet by mouth daily.   Marland Kitchen ELIQUIS 2.5 MG TABS tablet TAKE 1 TABLET BY MOUTH TWICE DAILY  . FIBER DIET TABS Take 2 tablets by mouth daily.  . fluticasone (FLONASE) 50 MCG/ACT nasal spray SHAKE LIQUID AND USE 2 SPRAYS IN EACH NOSTRIL DAILY  . furosemide (LASIX) 40 MG tablet Take 1 tablet (40 mg total) by mouth as needed.  . Melatonin 3-2 MG TABS Take by mouth.  . metoprolol succinate (TOPROL-XL) 25 MG 24 hr  tablet TAKE 1 TABLET(25 MG) BY MOUTH DAILY  . nitroGLYCERIN (NITROSTAT) 0.4 MG SL tablet Place 1 tablet (0.4 mg total) under the tongue every 5 (five) minutes as needed for chest pain (x 3 tabs).  Marland Kitchen omeprazole (PRILOSEC) 20 MG capsule TAKE 1 CAPSULE(20 MG) BY MOUTH DAILY  . SENNOSIDES PO Take 2 tablets by mouth daily.  . traMADol (ULTRAM) 50 MG tablet TAKE 1 TABLET BY MOUTH EVERY 6 HOURS AS NEEDED FOR PAIN  . TRELEGY ELLIPTA 100-62.5-25 MCG/INH AEPB INHALE 1 PUFF BY MOUTH ONCE DAILY  . vitamin B-12 (CYANOCOBALAMIN) 1000 MCG tablet Take 1,000 mcg by mouth daily.  . predniSONE (DELTASONE) 10 MG tablet Take 4 tablets for 3 days, 3 tablets for 3 days, 2 tablets for 3 days, 1 tablet for 3 days (Patient not taking: Reported on 04/17/2017)   No facility-administered encounter medications on file as of 05/31/2017.     Functional Status:  In your present state of health, do you have any difficulty performing the following activities: 09/22/2016  Hearing? Y  Vision? N  Difficulty concentrating or making decisions? N  Walking or climbing stairs? Y  Dressing or bathing? N  Doing errands, shopping? N  Preparing Food and eating ? N  Using the Toilet? N  In the past six months, have you accidently leaked urine? N  Do you have problems with loss of bowel control? N  Managing your Medications? N  Managing your Finances? N  Housekeeping or managing your Housekeeping? Y  Some recent data might be hidden    Fall/Depression Screening: Fall Risk  05/31/2017 04/17/2017 04/06/2017  Falls in the past year? No No Yes  Comment - - -  Number falls in past yr: - - 2 or more  Injury with Fall? - - Yes  Comment - - -  Risk Factor Category  - - High Fall Risk  Risk for fall due to : - - -  Follow up - - -   PHQ 2/9 Scores 01/12/2017 01/10/2017 10/27/2016 09/22/2016 09/22/2016 08/11/2016 06/23/2016  PHQ - 2 Score 4 0 0 0 0 0 0  PHQ- 9 Score 6 - - - - - -    Assessment: Patient continues to benefit from health coach  outreach for disease management and support.    THN CM Care Plan Problem One     Most Recent Value  THN Long Term Goal   in 90 days the patient will not have any hospital admissions  [facility] Long Term Goal Start Date  05/31/17  Interventions for Problem One Long Term Goal  [facility] talked with the patient about monitoring her health condition and copd zones.  THN CM Short Term Goal #1   In 30 days the patient will state that she is drinking more water.  THN CM Short Term Goal #1 Start Date  05/31/17  Interventions for Short Term Goal #1  [facility] talked with the patient about the benefits of drinking water and how it can help flush her system  THN CM Short Term Goal #2   In 30 days the patient will state that she is monitoring her diet.  THN CM Short Term Goal #2 Start Date  05/31/17  Interventions for Short Term Goal #2  [facility] advised the patient about eating out and monitoring the salt in her food.     Plan: RN Health Coach will contact patient in the month of May  and patient agrees to next outreach.  Lazaro Arms RN, BSN, [place] Direct Dial:  [phone]  Fax: [phone]

## 2017-06-06 ENCOUNTER — Other Ambulatory Visit: Payer: Self-pay | Admitting: Interventional Cardiology

## 2017-06-06 ENCOUNTER — Other Ambulatory Visit: Payer: Self-pay | Admitting: Family Medicine

## 2017-06-06 DIAGNOSIS — K219 Gastro-esophageal reflux disease without esophagitis: Secondary | ICD-10-CM

## 2017-06-08 ENCOUNTER — Encounter: Payer: Self-pay | Admitting: Family Medicine

## 2017-06-08 ENCOUNTER — Ambulatory Visit (INDEPENDENT_AMBULATORY_CARE_PROVIDER_SITE_OTHER): Payer: Medicare Other | Admitting: Family Medicine

## 2017-06-08 ENCOUNTER — Other Ambulatory Visit: Payer: Self-pay

## 2017-06-08 ENCOUNTER — Encounter: Payer: Self-pay | Admitting: Emergency Medicine

## 2017-06-08 ENCOUNTER — Ambulatory Visit (INDEPENDENT_AMBULATORY_CARE_PROVIDER_SITE_OTHER): Payer: Medicare Other | Admitting: Emergency Medicine

## 2017-06-08 VITALS — BP 122/74 | HR 90 | Temp 97.9°F | Ht 65.0 in | Wt 176.0 lb

## 2017-06-08 DIAGNOSIS — J449 Chronic obstructive pulmonary disease, unspecified: Secondary | ICD-10-CM

## 2017-06-08 DIAGNOSIS — N184 Chronic kidney disease, stage 4 (severe): Secondary | ICD-10-CM | POA: Diagnosis not present

## 2017-06-08 DIAGNOSIS — I495 Sick sinus syndrome: Secondary | ICD-10-CM | POA: Diagnosis not present

## 2017-06-08 DIAGNOSIS — Z79899 Other long term (current) drug therapy: Secondary | ICD-10-CM | POA: Diagnosis not present

## 2017-06-08 NOTE — Patient Instructions (Signed)
Please continue your Trelegy as you are taking it. Rinse and gargle after using.  Keep albuterol (Ventolin) available to use 2 puffs up to every 4 hours if needed for shortness of breath.  Please call our office if you develop any worsening of your breathing, increased wheezing, increased cough and mucous production.  Follow with Dr Lamonte Sakai in 4 months or sooner if you have any problems.

## 2017-06-08 NOTE — Progress Notes (Signed)
GERD  Subjective:    Patient ID: [name], female    DOB: 1923-03-28, 84 y.o.   MRN: [id number]  COPD  She complains of shortness of breath. There is no cough or wheezing. Pertinent negatives include no chest pain, ear pain, fever, headaches, postnasal drip, rhinorrhea, sneezing, sore throat or trouble swallowing. Her past medical history is significant for COPD.    ROV 03/30/17 --Very pleasant 84 year old woman with a history of former tobacco, atrial fibrillation with sick sinus syndrome, hypertension, chronic kidney disease, GERD with a hiatal hernia.  We follow her for her history of tobacco use and COPD.  She has been managed on Trelegy.  She uses fluticasone nasal spray for chronic allergic rhinitis, Prilosec 20 mg daily for GERD.  Uses albuterol rarely until the last week. She usually stays with a daughter, but has been at home on her own since last week. She reports an increase in exertional SOB over the last 10 days, increased congestion and cough, productive of greenish phlegm. Unclear whether there was a trigger - no URI sx, etc.   ROV 06/08/17 --[name] follows up today for her severe COPD.  She is 38, also has atrial fibrillation with sick sinus syndrome, hypertension, chronic kidney disease, GERD with a hiatal hernia.  We have been managing her on Trelegy. She has been living back and forth between her home and her daughter Lisa's home. She was at the beach 2 weeks ago, was having more dyspnea and fatigue, increased wheeze, minimal cough, minimal mucous. She had to use her albuterol more frequently. She has improved now. Did not have to get pred or abx. ? Whether her Trelegy was giving her full dose. She is planning to go on cruise next Saturday.    Review of Systems  Constitutional: Negative for fever and unexpected weight change.  HENT: Positive for congestion. Negative for dental problem, ear pain, nosebleeds, postnasal drip, rhinorrhea, sinus pressure, sneezing, sore throat and  trouble swallowing.   Eyes: Negative for redness and itching.  Respiratory: Positive for shortness of breath. Negative for cough, chest tightness and wheezing.   Cardiovascular: Positive for palpitations. Negative for chest pain and leg swelling.  Gastrointestinal: Negative for nausea and vomiting.  Genitourinary: Negative for dysuria.  Musculoskeletal: Negative for joint swelling.  Skin: Negative for rash.  Neurological: Negative for headaches.  Hematological: Does not bruise/bleed easily.  Psychiatric/Behavioral: Negative for dysphoric mood. The patient is nervous/anxious.         Objective:   Physical Exam  Vitals:   06/08/17 1034 06/08/17 1036  BP:  136/80  Pulse:  98  SpO2:  96%  Weight: 175 lb (79.4 kg)   Height: 5\' 5"  (1.651 m)    Gen: Pleasant, elderly woman in no distress,  normal affect  ENT: No lesions,  mouth clear,  oropharynx clear, no postnasal drip, slightly hoarse voice.   Neck: No JVD, no stridor  Lungs: No use of accessory muscles, no wheeze  Cardiovascular: Irregular, heart sounds normal, no murmur or gallops, no peripheral edema  Musculoskeletal: No deformities, no cyanosis or clubbing  Neuro: alert, non focal  Skin: Warm, no lesions or rashes      Assessment & Plan:  COPD, severe ([place]) She had some increased sx about 2-3 weeks ago but did not develop a full flare, did not require abx or pred. Discussed the signs, sx of an AE with her today.   Please continue your Trelegy as you are taking it. Rinse and  gargle after using.  Keep albuterol (Ventolin) available to use 2 puffs up to every 4 hours if needed for shortness of breath.  Please call our office if you develop any worsening of your breathing, increased wheezing, increased cough and mucous production.  Follow with Dr Lamonte Sakai in 4 months or sooner if you have any problems.  Baltazar Apo, MD, PhD 06/08/2017, 11:08 AM [facility] [phone] or if no answer [phone]

## 2017-06-08 NOTE — Assessment & Plan Note (Signed)
She had some increased sx about 2-3 weeks ago but did not develop a full flare, did not require abx or pred. Discussed the signs, sx of an AE with her today.   Please continue your Trelegy as you are taking it. Rinse and gargle after using.  Keep albuterol (Ventolin) available to use 2 puffs up to every 4 hours if needed for shortness of breath.  Please call our office if you develop any worsening of your breathing, increased wheezing, increased cough and mucous production.  Follow with Dr Lamonte Sakai in 4 months or sooner if you have any problems.

## 2017-06-08 NOTE — Patient Instructions (Signed)
I am glad you were able to control your leg swelling with just one dose Lasix a week.  We are checking your kidneys today.   Dr Kalicia Dufresne will call you if your tests are not good. Otherwise he will send you a letter.  If you sign up for MyChart online, you will be able to see your test results once Dr Eddye Broxterman has reviewed them.  If you do not hear from [place] with in 1 weeks please call our office

## 2017-06-09 ENCOUNTER — Other Ambulatory Visit: Payer: Self-pay | Admitting: Family Medicine

## 2017-06-09 ENCOUNTER — Other Ambulatory Visit: Payer: Self-pay | Admitting: Interventional Cardiology

## 2017-06-09 ENCOUNTER — Encounter: Payer: Self-pay | Admitting: Family Medicine

## 2017-06-09 DIAGNOSIS — K219 Gastro-esophageal reflux disease without esophagitis: Secondary | ICD-10-CM

## 2017-06-09 LAB — BASIC METABOLIC PANEL
BUN / CREAT RATIO: 21 (ref 12–28)
BUN: 34 mg/dL (ref 10–36)
CALCIUM: 9.1 mg/dL (ref 8.7–10.3)
CO2: 24 mmol/L (ref 20–29)
CREATININE: 1.59 mg/dL — AB (ref 0.57–1.00)
Chloride: 103 mmol/L (ref 96–106)
GFR calc non Af Amer: 28 mL/min/{1.73_m2} — ABNORMAL LOW (ref >59)
GFR, EST AFRICAN AMERICAN: 32 mL/min/{1.73_m2} — AB (ref >59)
GLUCOSE: 128 mg/dL — AB (ref 65–99)
Potassium: 4.4 mmol/L (ref 3.5–5.2)
Sodium: 143 mmol/L (ref 134–144)

## 2017-06-09 NOTE — Assessment & Plan Note (Signed)
Established problem. Stable. Continue current therapy  

## 2017-06-09 NOTE — Progress Notes (Signed)
Subjective:    Patient ID: [name], female    DOB: December 22, 1923, 84 y.o.   MRN: [id number] ALTON TREMBLAY is alone Sources of clinical information for visit is/are patient and past medical records. Nursing assessment for this office visit was reviewed with the patient for accuracy and revision.  Previous Report(s) Reviewed: historical medical records  Depression screen [facility] 2/9 06/08/2017  Decreased Interest 1  Down, Depressed, Hopeless 2  PHQ - 2 Score 3  Altered sleeping 0  Tired, decreased energy 1  Change in appetite 0  Feeling bad or failure about yourself  1  Trouble concentrating 0  Moving slowly or fidgety/restless 0  Suicidal thoughts 0  PHQ-9 Score 5  Difficult doing work/chores -  Some recent data might be hidden   Fall Risk  06/08/2017 05/31/2017 04/17/2017 04/06/2017 03/09/2017  Falls in the past year? No No No Yes Yes  Comment - - - - -  Number falls in past yr: - - - 2 or more 2 or more  Injury with Fall? - - - Yes Yes  Comment - - - - -  Risk Factor Category  - - - High Fall Risk High Fall Risk  Risk for fall due to : - - - - -  Follow up - - - - -    HPI  Problem List Items Addressed This Visit      High   SICK SINUS SYNDROME (Chronic) - (+)occasional eart racing at times persists - no chest pain - (+) intermittent shortness of breath with exertion - No syncope, No dizziness. Noo vertigo,  - No vertigo. No change in hearing.  - On amiodarone and metoprolol XL   COPD, severe (HCC) (Chronic) - Recent episodes of SOB when rushing around.   Responds to mdi albuterol and rest MDI - Taking Treleegy daily - saw Dr Lamonte Sakai today (Pulm) no change in therapy - No cough no change in sputum   Chronic kidney disease (CKD), stage IV (severe) (HCC) (Chronic) CHRONIC KIDNEY DISEASE STAGE IV  - Onset: several years ago - calculated GFR: 30  - NSAIDS: no - ACEI/ARB: no - Diuretics: Lasix once a week - Bone Metabolism: taking calcium and vit d - Potassium: nl -  Acidosis: no      SH: No smoking, Dgt lives with Mrs Luchsinger. Remains active in community  Review of Systems See HPI    Objective:   Physical Exam VS reviewed Vitals:   06/08/17 1515  Weight: 176 lb (79.8 kg)  Height: 5\' 5"  (1.651 m)  lying 130/82 110 bpm;  Sit 134/78 123 BPM; Stand 118/84  134 bpm  SaO2 88%  Walking HR by pulse ox 200 ft (90 to 130 bpm) GEN: Alert, Cooperative, Groomed, NAD HEENT: PERRL; EAC bilaterally not occluded, TM's translucent with normal LM, (+) LR;                No cervical LAN, No thyromegaly, No palpable masses COR: RRR, No M/G/R, No JVD, Normal PMI size and location LUNGS: BCTA, No Acc mm use, speaking in full sentences ABDOMEN: (+)BS, soft, NT, ND, No HSM, No palpable masses GU: Normal Rectal tone, no palpable masses, prostate without hypertrophy/asymmetry/nodularity. Hemoccult negative. EXT: No peripheral leg edema. Feet without deformity or lesions. Palpable bilateral pedal pulses.  SKIN: No lesion nor rashes of face/trunk/extremities Neuro: Oriented to person, place, and time; Strength: 5/5 Bil. UE and LE symmetric; Sensation: Intact grossly to touch all four extremities; Cerebellar: Finger-to-Nose  intact, Rhomberg negative DTR: Bilateral Bicep 2+, Bilateral Triceps 2+, Bilateral Knees 2+, Bilateral Ankles 1+ Gait: Normal speed, No significant path deviation, Step through +,  Psych: Normal affect/thought/speech/language    Assessment & Plan:

## 2017-06-09 NOTE — Assessment & Plan Note (Signed)
BMP Latest Ref Rng & Units 06/08/2017 04/06/2017 03/09/2017  Glucose 65 - 99 mg/dL 128(H) 92 89  BUN 10 - 36 mg/dL 34 51(H) 27  Creatinine 0.57 - 1.00 mg/dL 1.59(H) 1.81(H) 1.53(H)  BUN/Creat Ratio 12 - 28 21 28 18   Sodium 134 - 144 mmol/L 143 143 142  Potassium 3.5 - 5.2 mmol/L 4.4 4.6 4.3  Chloride 96 - 106 mmol/L 103 103 103  CO2 20 - 29 mmol/L 24 24 24   Calcium 8.7 - 10.3 mg/dL 9.1 9.0 8.9   Established problem. Stable. Continue current therapy

## 2017-06-09 NOTE — Assessment & Plan Note (Signed)
Stable On DOAC and metoprolol

## 2017-06-13 DIAGNOSIS — H353221 Exudative age-related macular degeneration, left eye, with active choroidal neovascularization: Secondary | ICD-10-CM | POA: Diagnosis not present

## 2017-06-13 DIAGNOSIS — H353211 Exudative age-related macular degeneration, right eye, with active choroidal neovascularization: Secondary | ICD-10-CM | POA: Diagnosis not present

## 2017-06-27 DIAGNOSIS — H353221 Exudative age-related macular degeneration, left eye, with active choroidal neovascularization: Secondary | ICD-10-CM | POA: Diagnosis not present

## 2017-06-27 DIAGNOSIS — H353211 Exudative age-related macular degeneration, right eye, with active choroidal neovascularization: Secondary | ICD-10-CM | POA: Diagnosis not present

## 2017-06-29 ENCOUNTER — Ambulatory Visit (INDEPENDENT_AMBULATORY_CARE_PROVIDER_SITE_OTHER): Payer: Medicare Other | Admitting: Internal Medicine

## 2017-06-29 ENCOUNTER — Encounter: Payer: Self-pay | Admitting: Internal Medicine

## 2017-06-29 ENCOUNTER — Other Ambulatory Visit: Payer: Self-pay

## 2017-06-29 VITALS — BP 112/68 | HR 93 | Temp 98.1°F | Ht 65.0 in | Wt 177.4 lb

## 2017-06-29 DIAGNOSIS — R5383 Other fatigue: Secondary | ICD-10-CM | POA: Diagnosis present

## 2017-06-29 DIAGNOSIS — R350 Frequency of micturition: Secondary | ICD-10-CM

## 2017-06-29 DIAGNOSIS — J441 Chronic obstructive pulmonary disease with (acute) exacerbation: Secondary | ICD-10-CM

## 2017-06-29 LAB — POCT URINALYSIS DIP (MANUAL ENTRY)
Bilirubin, UA: NEGATIVE
Blood, UA: NEGATIVE
Glucose, UA: NEGATIVE mg/dL
Nitrite, UA: NEGATIVE
PH UA: 5.5 (ref 5.0–8.0)
Spec Grav, UA: 1.025 (ref 1.010–1.025)
UROBILINOGEN UA: 1 U/dL

## 2017-06-29 LAB — POCT UA - MICROSCOPIC ONLY

## 2017-06-29 MED ORDER — PREDNISONE 20 MG PO TABS: 40.0000 mg | ORAL_TABLET | Freq: Every day | ORAL | 0 refills | Status: DC

## 2017-06-29 MED ORDER — AMOXICILLIN-POT CLAVULANATE 875-125 MG PO TABS: 1.0000 | ORAL_TABLET | Freq: Two times a day (BID) | ORAL | 0 refills | Status: DC

## 2017-06-29 NOTE — Patient Instructions (Signed)
Ms. Dust,  With your increased wheezing, I think you are having a COPD exacerbation. I recommend taking augmentin twice daily for the next week. Take prednisone 40 mg with breakfast for the next 5 days. Follow up with Dr. McDiarmid next week if you are still feeling fatigued.  I will call with your urine result.  Best, Dr. Ola Spurr

## 2017-06-30 ENCOUNTER — Encounter: Payer: Self-pay | Admitting: Internal Medicine

## 2017-06-30 DIAGNOSIS — R5383 Other fatigue: Secondary | ICD-10-CM | POA: Insufficient documentation

## 2017-06-30 NOTE — Progress Notes (Signed)
[facility] Progress Note  Subjective:  [name] is a 84 y.o. female with history of CAD, afib, and severe COPD who presents for increased fatigue after recent 10-day cruise. Patient reports upper thigh pain worst with using stairs at her home. She does not have pain at rest. She says her room on the cruise ship was on the far end of the boat away from dining and activities, so she had to do a lot more walking than usual. She actually had family members push her on her rolling walker when she got too fatigued. She tried to get a wheelchair, but they had to be pre-booked. She thinks a lot of her fatigue is from over exertion. However, she has noted increased shortness of breath and has been having to use her albuterol inhaler a couple times a day, whereas she normally manages with her maintenance trelegy inhaler alone. She is coughing up some phlegm but is unsure if it is more than usual. She cannot recall the last time she needed steroids for COPD exacerbation but says it has been a while. She also reports some loss of urine recently--she gets the urge and does not have enough time to get to the bathroom. This is new. She reports having only 1 kidney and that she wants to make sure she doesn't have an infection. ROS: No chest pain, no fevers, no n/v/d, no change in appetite, no abdominal pain, no dysuria   Allergies  Allergen Reactions  . Baclofen Other (See Comments)    Confusion occurred with taking 3 at the same time.  . Codeine Phosphate Nausea Only  . Norco [Hydrocodone-Acetaminophen] Nausea And Vomiting  . Hydrochlorothiazide Other (See Comments)    Possible acute gout or arthritis of wrist  . Montelukast Sodium Other (See Comments)    Unspecified reaction.     Social History   Tobacco Use  . Smoking status: Former Smoker    Packs/day: 1.00    Types: Cigarettes    Last attempt to quit: 03/02/2009    Years since quitting: 8.3  . Smokeless tobacco: Never Used   Substance Use Topics  . Alcohol use: Yes    Alcohol/week: 7.8 oz    Types: 6 Standard drinks or equivalent, 7 Glasses of wine per week    Comment: 5 oz white wine per day    Objective: Blood pressure 112/68, pulse 93, temperature 98.1 F (36.7 C), temperature source Oral, height 5\' 5"  (1.651 m), weight 177 lb 6.4 oz (80.5 kg), SpO2 99 %. Body mass index is 29.52 kg/m. Constitutional: Pleasant, well-appearing older female in NAD Cardiovascular: Irregularly irregular, no m/r/g.  Pulmonary/Chest: Diffuse expiratory wheezing. No increased WOB. Able to speak in complete sentences with ease.  Abdominal: Soft. +BS, NT Musculoskeletal: No LE edema. Cannot elicit pain with palpation of upper legs and no pain with internal and external rotation of hip.  Neurological: AOx3, no focal deficits. Skin: Skin is warm and dry. No rash noted.  Psychiatric: Normal mood and affect.  Vitals reviewed  Assessment/Plan: Other fatigue - Likely multifactoral. Patient appears to be having a mild COPD exacerbation--she is maintaining her O2 sats and is well appearing but has diffuse wheezing. Precepted with patient's PCP Dr. McDiarmid who confirmed patient's breathing is worse than baseline. Muscle pains improve with rest so will not obtain labs. No chest pain, and afib rate controlled currently. Will rule-out UTI with UA. - Ordered augmentin x 7 days (no recent hospitalizations or antibiotic use) and prednisone  x 5 days. - Encouraged patient to continue to use her albuterol inhaler prn for wheezing/shortness of breath - Obtained UA (not consistent with infection)  Follow-up next week to assess for improvement unless feeling completely improved.   Olene Floss, MD [place], PGY-3

## 2017-06-30 NOTE — Assessment & Plan Note (Addendum)
-   Likely multifactoral. Patient appears to be having a mild COPD exacerbation--she is maintaining her O2 sats and is well appearing but has diffuse wheezing. Precepted with patient's PCP Dr. McDiarmid who confirmed patient's breathing is worse than baseline. Muscle pains improve with rest so will not obtain labs. No chest pain, and afib rate controlled currently. Will rule-out UTI with UA. - Ordered augmentin x 7 days (no recent hospitalizations or antibiotic use) and prednisone x 5 days. - Encouraged patient to continue to use her albuterol inhaler prn for wheezing/shortness of breath - Obtained UA (not consistent with infection)

## 2017-07-03 ENCOUNTER — Other Ambulatory Visit: Payer: Self-pay | Admitting: Family Medicine

## 2017-07-10 DIAGNOSIS — Z6831 Body mass index (BMI) 31.0-31.9, adult: Secondary | ICD-10-CM | POA: Diagnosis not present

## 2017-07-10 DIAGNOSIS — Z1231 Encounter for screening mammogram for malignant neoplasm of breast: Secondary | ICD-10-CM | POA: Diagnosis not present

## 2017-07-10 DIAGNOSIS — Z01419 Encounter for gynecological examination (general) (routine) without abnormal findings: Secondary | ICD-10-CM | POA: Diagnosis not present

## 2017-07-11 DIAGNOSIS — H353211 Exudative age-related macular degeneration, right eye, with active choroidal neovascularization: Secondary | ICD-10-CM | POA: Diagnosis not present

## 2017-07-20 ENCOUNTER — Other Ambulatory Visit: Payer: Self-pay

## 2017-07-20 NOTE — Patient Outreach (Signed)
[facility] [facility]) Care Management  07/20/2017  [name] 1923/08/08 [id number]   [name] is a 84 year old female with medical history which includes MI, COPD, GERD, CKD Stage IV w/ solitary kidney, CAD, sick sinus syndrome s/p permanent pacemaker, and chronic diastolic heart failure. [name] is being followed in the community by the [place] team.   I reached out to [name] today who told me that she returned from the beach with her family just yesterday. She said the trip was very taxing because the heat made her breathing laborious. She reports staying inside yesterday afternoon and today to try and "catch up". She told me about having visited with Dr. Ola Spurr on 06/29/17 when she was thought to be having a mild COPD exacerbation and was treated with oral antibiotics and oral steroids. Today, she says she does not feeling labored in her breathing and denies chest pain or other new or worsened symptoms.   Plan: [name] expressed her gratitude for our ongoing outreach and health coaching services and agreed to allow [place] to reach out to her again next month.   THN CM Care Plan Problem One     Most Recent Value  Care Plan Problem One  knowledge deficit  Role Documenting the Problem One  Health Coach  Care Plan for Problem One  Active  THN Long Term Goal   in 90 days the patient will not have any hospital admissions  [facility] Long Term Goal Start Date  05/31/17  Interventions for Problem One Long Term Goal  reviewed current health status and provider visits  THN CM Short Term Goal #1   In 30 days the patient will state that she is drinking more water.  THN CM Short Term Goal #1 Start Date  05/31/17  [facility] CM Short Term Goal #1 Met Date  07/20/17  Interventions for Short Term Goal #1  inquired about nutrition and hydration,  advised re: regular meals and maintaing adequate hydration especially during hot summer months  THN CM Short Term Goal #2    In 30 days the patient will state that she is monitoring her diet.  THN CM Short Term Goal #2 Start Date  05/31/17  [facility] CM Short Term Goal #2 Met Date  07/20/17  Interventions for Short Term Goal #2  dietary adherence discussed  THN CM Short Term Goal #3  Over the next 30 days, patient will report to her provider any new or worsened respiratory symptoms  THN CM Short Term Goal #3 Start Date  07/20/17  Interventions for Short Tern Goal #3  discussed COPD action plan with patient      [place] Care Management  [phone]

## 2017-08-08 DIAGNOSIS — H353221 Exudative age-related macular degeneration, left eye, with active choroidal neovascularization: Secondary | ICD-10-CM | POA: Diagnosis not present

## 2017-08-08 DIAGNOSIS — H353211 Exudative age-related macular degeneration, right eye, with active choroidal neovascularization: Secondary | ICD-10-CM | POA: Diagnosis not present

## 2017-08-09 ENCOUNTER — Other Ambulatory Visit: Payer: Self-pay | Admitting: Internal Medicine

## 2017-08-16 ENCOUNTER — Telehealth: Payer: Self-pay | Admitting: Emergency Medicine

## 2017-08-16 MED ORDER — DOXYCYCLINE HYCLATE 100 MG PO TABS: 100.0000 mg | ORAL_TABLET | Freq: Two times a day (BID) | ORAL | 0 refills | Status: DC

## 2017-08-16 NOTE — Telephone Encounter (Signed)
Please let her know that I would like for her to take doxycycline 100mg  bid x 7 days Also please make sure that she is using mucinex, at least 600mg  bid  Have her call us in next couple days to let us know if she is improving

## 2017-08-16 NOTE — Telephone Encounter (Signed)
Pt is aware of below message and voiced her understanding. Rx for Doxycycline has been sent to preferred pharmacy. Nothing further is needed.

## 2017-08-16 NOTE — Telephone Encounter (Signed)
Spoke with pt, she is full of congestion and she is SOB. She would like something sent in.for her symptoms. She is coughing a little bit but hard to get the mucus up. She denies fever and states she just got off of prednisone. She hasn't tried any other medications for her symptoms. I offered her to see a NP but she doesn't have anybody to bring her here. She wanted me to send this message to [place]. Please advise.   [facility].

## 2017-08-21 ENCOUNTER — Other Ambulatory Visit: Payer: Self-pay

## 2017-08-21 NOTE — Patient Outreach (Signed)
[place] [facility]) Care Management  08/21/2017  [name] [birth year] [id number]   1 st outreach attempt to the patient for assessment. No answer.  HIPAA compliant voicemail left with contact information.  Plan: RN Health Coach will send letter. RN Health Coach will make another attempt to the patient within four business days.  Lazaro Arms RN, BSN, [place] Direct Dial:  [phone]  Fax: [phone]

## 2017-08-22 DIAGNOSIS — H43813 Vitreous degeneration, bilateral: Secondary | ICD-10-CM | POA: Diagnosis not present

## 2017-08-22 DIAGNOSIS — H353231 Exudative age-related macular degeneration, bilateral, with active choroidal neovascularization: Secondary | ICD-10-CM | POA: Diagnosis not present

## 2017-08-24 ENCOUNTER — Ambulatory Visit (INDEPENDENT_AMBULATORY_CARE_PROVIDER_SITE_OTHER): Payer: Medicare Other | Admitting: Emergency Medicine

## 2017-08-24 ENCOUNTER — Other Ambulatory Visit: Payer: Self-pay | Admitting: Emergency Medicine

## 2017-08-24 ENCOUNTER — Encounter: Payer: Self-pay | Admitting: Emergency Medicine

## 2017-08-24 ENCOUNTER — Other Ambulatory Visit: Payer: Self-pay

## 2017-08-24 ENCOUNTER — Ambulatory Visit (INDEPENDENT_AMBULATORY_CARE_PROVIDER_SITE_OTHER)
Admission: RE | Admit: 2017-08-24 | Discharge: 2017-08-24 | Disposition: A | Discharge status: Home / Self Care | Payer: Medicare Other | Source: Ambulatory Visit | Attending: Emergency Medicine | Admitting: Emergency Medicine

## 2017-08-24 DIAGNOSIS — J449 Chronic obstructive pulmonary disease, unspecified: Secondary | ICD-10-CM

## 2017-08-24 DIAGNOSIS — D508 Other iron deficiency anemias: Secondary | ICD-10-CM | POA: Diagnosis not present

## 2017-08-24 DIAGNOSIS — R0989 Other specified symptoms and signs involving the circulatory and respiratory systems: Secondary | ICD-10-CM | POA: Diagnosis not present

## 2017-08-24 DIAGNOSIS — K5901 Slow transit constipation: Secondary | ICD-10-CM | POA: Diagnosis not present

## 2017-08-24 MED ORDER — IPRATROPIUM-ALBUTEROL 0.5-2.5 (3) MG/3ML IN SOLN: 3.0000 mL | RESPIRATORY_TRACT | 0 refills | Status: DC

## 2017-08-24 NOTE — Progress Notes (Signed)
GERD  Subjective:    Patient ID: [name], female    DOB: June 21, 1923, 84 y.o.   MRN: [id number]  COPD  She complains of shortness of breath. There is no cough or wheezing. Pertinent negatives include no chest pain, ear pain, fever, headaches, postnasal drip, rhinorrhea, sneezing, sore throat or trouble swallowing. Her past medical history is significant for COPD.    ROV 03/30/17 --Very pleasant 84 year old woman with a history of former tobacco, atrial fibrillation with sick sinus syndrome, hypertension, chronic kidney disease, GERD with a hiatal hernia.  We follow her for her history of tobacco use and COPD.  She has been managed on Trelegy.  She uses fluticasone nasal spray for chronic allergic rhinitis, Prilosec 20 mg daily for GERD.  Uses albuterol rarely until the last week. She usually stays with a daughter, but has been at home on her own since last week. She reports an increase in exertional SOB over the last 10 days, increased congestion and cough, productive of greenish phlegm. Unclear whether there was a trigger - no URI sx, etc.   ROV 06/08/17 --[name] follows up today for her severe COPD.  She is 22, also has atrial fibrillation with sick sinus syndrome, hypertension, chronic kidney disease, GERD with a hiatal hernia.  We have been managing her on Trelegy. She has been living back and forth between her home and her daughter [name]'s home. She was at the beach 2 weeks ago, was having more dyspnea and fatigue, increased wheeze, minimal cough, minimal mucous. She had to use her albuterol more frequently. She has improved now. Did not have to get pred or abx. ? Whether her Trelegy was giving her full dose. She is planning to go on cruise next Saturday.   Acute OV 08/24/17 --and has severe COPD, atrial fibrillation with sick sinus syndrome, hypertension, chronic kidney disease and a hiatal hernia with GERD.  We have been managing her on Trelegy,  But she isn't sure that she is getting it down  adequately.  She is been having a hard time over the last several weeks. She was treated with pred about a month ago, then I gave her doxycycline 6/19 for purulent mucous - has cleared some. I then gave her another pred taper earlier this week for progressive dyspnea and wheeze. She is using albuterol about 2x a day. She wonders if nebs would be better.    Review of Systems  Constitutional: Negative for fever and unexpected weight change.  HENT: Positive for congestion. Negative for dental problem, ear pain, nosebleeds, postnasal drip, rhinorrhea, sinus pressure, sneezing, sore throat and trouble swallowing.   Eyes: Negative for redness and itching.  Respiratory: Positive for shortness of breath. Negative for cough, chest tightness and wheezing.   Cardiovascular: Positive for palpitations. Negative for chest pain and leg swelling.  Gastrointestinal: Negative for nausea and vomiting.  Genitourinary: Negative for dysuria.  Musculoskeletal: Negative for joint swelling.  Skin: Negative for rash.  Neurological: Negative for headaches.  Hematological: Does not bruise/bleed easily.  Psychiatric/Behavioral: Negative for dysphoric mood. The patient is nervous/anxious.         Objective:   Physical Exam  Vitals:   08/24/17 1514  BP: 118/80  Pulse: 73  Temp: 98.3 F (36.8 C)  TempSrc: Oral  SpO2: 94%  Weight: 173 lb 6.4 oz (78.7 kg)  Height: 5\' 5"  (1.651 m)   Gen: Pleasant, elderly woman in no distress,  normal affect  ENT: No lesions,  mouth clear,  oropharynx clear, no postnasal drip, slightly hoarse voice.   Neck: No JVD, no stridor  Lungs: No use of accessory muscles, very coarse bilaterally, expiratory wheezes present  Cardiovascular: Irregular, heart sounds normal, no murmur or gallops, no peripheral edema  Musculoskeletal: No deformities, no cyanosis or clubbing  Neuro: alert, non focal  Skin: Warm, no lesions or rashes      Assessment & Plan:  COPD, severe  (HCC) Recent exacerbations quite slow to resolve, still with bronchospasm, mucus which is now less purulent.  She is completed doxycycline and is now on an extended prednisone taper.  Not clear to me that she is getting the trilogy effectively in her current state.  I would like to temporarily change to scheduled DuoNeb and then hopefully we can transition back to Trelegy at some point if she rebounds.  I also think she needs a walking oximetry to ensure that she is not desaturating.  She is willing to use oxygen if we believe it would be beneficial.  May need to consider as we go forward the efficacy of daily schedule prednisone, will depend on whether this flare improves.  Chest x-ray today to evaluate for evolving infiltrate, she may need Levaquin or another antibiotic.  Walking oximetry today on room air.  CXR today.  Please complete your prednisone taper as prescribed. Temporarily stop Trelegy. We will start albuterol/ipratropium nebulizers 4 times a day on a schedule.  Take these every day as scheduled until we decide to transition back to the Trelegy. We will start albuterol nebulized that she can use up to every 4 hours if needed for shortness of breath, cough, chest tightness. Follow with Dr Lamonte Sakai in 1 month  Baltazar Apo, MD, PhD 08/24/2017, 3:52 PM [facility] [phone] or if no answer [phone]

## 2017-08-24 NOTE — Patient Instructions (Addendum)
Walking oximetry today on room air.  CXR today.  Please complete your prednisone taper as prescribed. Temporarily stop Trelegy. We will start albuterol/ipratropium nebulizers 4 times a day on a schedule.  Take these every day as scheduled until we decide to transition back to the Trelegy. We will start albuterol nebulized that she can use up to every 4 hours if needed for shortness of breath, cough, chest tightness. Follow with Dr Lamonte Sakai in 1 month

## 2017-08-24 NOTE — Addendum Note (Signed)
Addended by: Parke Poisson E on: 08/24/2017 04:25 PM   Modules accepted: Orders

## 2017-08-24 NOTE — Patient Outreach (Signed)
[place] [facility]) Care Management  08/24/2017  NATALIYAH PACKHAM Jun 24, 1923 [id number]   2nd attempt to outreach the patient for assessment. No answer.  HIPAA compliant voicemail left with contact information.  Plan: RN Health Coach will make another outreach attempt to the patient within the next four business days.  Lazaro Arms RN, BSN, [place] Direct Dial:  [phone]  Fax: [phone]

## 2017-08-24 NOTE — Addendum Note (Signed)
Addended by: Vivia Ewing on: 08/24/2017 04:09 PM   Modules accepted: Orders

## 2017-08-24 NOTE — Assessment & Plan Note (Signed)
Recent exacerbations quite slow to resolve, still with bronchospasm, mucus which is now less purulent.  She is completed doxycycline and is now on an extended prednisone taper.  Not clear to me that she is getting the trilogy effectively in her current state.  I would like to temporarily change to scheduled DuoNeb and then hopefully we can transition back to Trelegy at some point if she rebounds.  I also think she needs a walking oximetry to ensure that she is not desaturating.  She is willing to use oxygen if we believe it would be beneficial.  May need to consider as we go forward the efficacy of daily schedule prednisone, will depend on whether this flare improves.  Chest x-ray today to evaluate for evolving infiltrate, she may need Levaquin or another antibiotic.  Walking oximetry today on room air.  CXR today.  Please complete your prednisone taper as prescribed. Temporarily stop Trelegy. We will start albuterol/ipratropium nebulizers 4 times a day on a schedule.  Take these every day as scheduled until we decide to transition back to the Trelegy. We will start albuterol nebulized that she can use up to every 4 hours if needed for shortness of breath, cough, chest tightness. Follow with Dr Lamonte Sakai in 1 month

## 2017-08-24 NOTE — Addendum Note (Signed)
Addended by: Vivia Ewing on: 08/24/2017 04:01 PM   Modules accepted: Orders

## 2017-08-28 ENCOUNTER — Other Ambulatory Visit: Payer: Self-pay

## 2017-08-28 NOTE — Patient Outreach (Signed)
[name]) Care Management  [place]  08/28/2017   [name] 08-25-1923 [id number]  Subjective: Telephone call to the patient for assessment. HIPAA verified.  The patient states that she has been having some breathing problems this month.  She has seen her pulmonologist and taking her medications prescribed to her.  She still describes having  shortness of breath, feeling tired, cough and unable to walk long distances. I advised the patient to stay out of the heat, and stay hydrated. She is waiting for advanced home care to bring her out a nebulizer.  She has gone to the pharmacy and picked up her nebulizer solution.  I called advanced home care for the patient and spoke with [facility] and she stated it had been shipped out but may take 3-5 days.  I called back advised the patient to call them and let them know that a family member can come and pick up her nebulizer to get it sooner.  She verbalized understanding. The patient states that she is having some bruising that she states is new.  I advised the patient to call her physician and let him know her new symptoms that she is having.  She verbalized understanding ing and that she would call today.  She does have a follow up appointment with her primary on the 52 th of this month.  Encounter Medications:  Outpatient Encounter Medications as of 08/28/2017  Medication Sig  . albuterol (VENTOLIN HFA) 108 (90 Base) MCG/ACT inhaler For 5 days, then every 6 hours as needed for cough or shortness of breath or wheezing.  Marland Kitchen amiodarone (PACERONE) 200 MG tablet TAKE 1 TABLET(200 MG) BY MOUTH DAILY  . aspirin 81 MG chewable tablet Chew 81 mg by mouth daily.  Marland Kitchen atorvastatin (LIPITOR) 20 MG tablet TAKE 1 TABLET BY MOUTH DAILY  . calcium carbonate (OSCAL) 1500 (600 Ca) MG TABS tablet Take 600 mg of elemental calcium by mouth 2 (two) times daily with a meal.  . Cholecalciferol 1000 units tablet Take 1 tablet by mouth daily.   Marland Kitchen ELIQUIS  2.5 MG TABS tablet TAKE 1 TABLET BY MOUTH TWICE DAILY  . fluticasone (FLONASE) 50 MCG/ACT nasal spray SHAKE LIQUID AND USE 2 SPRAYS IN EACH NOSTRIL DAILY  . furosemide (LASIX) 40 MG tablet TAKE 1/2 TABLET(20 MG) BY MOUTH DAILY AS NEEDED FOR FLUID RETENTION OR SWELLING  . ipratropium-albuterol (DUONEB) 0.5-2.5 (3) MG/3ML SOLN USE 3 ML VIA NEBULIZER EVERY 4 HOURS  . metoprolol succinate (TOPROL-XL) 25 MG 24 hr tablet TAKE 1 TABLET(25 MG) BY MOUTH DAILY  . nitroGLYCERIN (NITROSTAT) 0.4 MG SL tablet Place 1 tablet (0.4 mg total) under the tongue every 5 (five) minutes as needed for chest pain (x 3 tabs).  Marland Kitchen omeprazole (PRILOSEC) 20 MG capsule TAKE 1 CAPSULE(20 MG) BY MOUTH DAILY  . predniSONE (DELTASONE) 20 MG tablet TAKE 2 TABLETS(40 MG) BY MOUTH DAILY WITH BREAKFAST  . SENNOSIDES PO Take 2 tablets by mouth daily.  . traMADol (ULTRAM) 50 MG tablet TAKE 1 TABLET BY MOUTH EVERY 6 HOURS AS NEEDED FOR PAIN  . vitamin B-12 (CYANOCOBALAMIN) 1000 MCG tablet Take 1,000 mcg by mouth daily.  Marland Kitchen FIBER DIET TABS Take 2 tablets by mouth daily.  . TRELEGY ELLIPTA 100-62.5-25 MCG/INH AEPB INHALE 1 PUFF BY MOUTH ONCE DAILY (Patient not taking: Reported on 08/28/2017)   No facility-administered encounter medications on file as of 08/28/2017.     Functional Status:  In your present state of health, do  you have any difficulty performing the following activities: 09/22/2016  Hearing? Y  Vision? N  Difficulty concentrating or making decisions? N  Walking or climbing stairs? Y  Dressing or bathing? N  Doing errands, shopping? N  Preparing Food and eating ? N  Using the Toilet? N  In the past six months, have you accidently leaked urine? N  Do you have problems with loss of bowel control? N  Managing your Medications? N  Managing your Finances? N  Housekeeping or managing your Housekeeping? Y  Some recent data might be hidden    Fall/Depression Screening: Fall Risk  08/28/2017 06/29/2017 06/08/2017  Falls in the  past year? No No No  Comment - - -  Number falls in past yr: - - -  Injury with Fall? - - -  Comment - - -  Risk Factor Category  - - -  Risk for fall due to : - - -  Follow up - - -   PHQ 2/9 Scores 06/29/2017 06/08/2017 01/12/2017 01/10/2017 10/27/2016 09/22/2016 09/22/2016  PHQ - 2 Score 0 3 4 0 0 0 0  PHQ- 9 Score - 5 6 - - - -    Assessment: Patient will continue to benefit from health coach outreach for disease management and support.  THN CM Care Plan Problem One     Most Recent Value  THN Long Term Goal   in 90 days the patient will not have any hospital admissions  [facility] Long Term Goal Start Date  08/28/17  Interventions for Problem One Long Term Goal  reviewed current health status   THN CM Short Term Goal #1   In 30 days the patient will state that she has called and spoken with the physician about her current condition  THN CM Short Term Goal #1 Start Date  08/28/17  Interventions for Short Term Goal #1  discussed with the patient about keeping her doctor informed of her condition     Plan: [place] will contact patient in the month of July and patient agrees to next outreach.  Lazaro Arms RN, BSN, [place] Direct Dial:  [phone]  Fax: [phone]

## 2017-08-29 ENCOUNTER — Encounter (HOSPITAL_COMMUNITY): Payer: Self-pay | Admitting: Emergency Medicine

## 2017-08-29 ENCOUNTER — Observation Stay (HOSPITAL_COMMUNITY)
Admission: EM | Admit: 2017-08-29 | Discharge: 2017-08-30 | Disposition: A | Discharge status: Home / Self Care | Payer: Medicare Other | Attending: Family Medicine | Admitting: Family Medicine

## 2017-08-29 DIAGNOSIS — J441 Chronic obstructive pulmonary disease with (acute) exacerbation: Principal | ICD-10-CM | POA: Diagnosis present

## 2017-08-29 DIAGNOSIS — Z7982 Long term (current) use of aspirin: Secondary | ICD-10-CM | POA: Insufficient documentation

## 2017-08-29 DIAGNOSIS — Z885 Allergy status to narcotic agent status: Secondary | ICD-10-CM | POA: Diagnosis not present

## 2017-08-29 DIAGNOSIS — G3184 Mild cognitive impairment, so stated: Secondary | ICD-10-CM | POA: Insufficient documentation

## 2017-08-29 DIAGNOSIS — E7800 Pure hypercholesterolemia, unspecified: Secondary | ICD-10-CM | POA: Insufficient documentation

## 2017-08-29 DIAGNOSIS — I5030 Unspecified diastolic (congestive) heart failure: Secondary | ICD-10-CM | POA: Insufficient documentation

## 2017-08-29 DIAGNOSIS — Z86010 Personal history of colonic polyps: Secondary | ICD-10-CM | POA: Diagnosis not present

## 2017-08-29 DIAGNOSIS — N184 Chronic kidney disease, stage 4 (severe): Secondary | ICD-10-CM | POA: Diagnosis not present

## 2017-08-29 DIAGNOSIS — Z905 Acquired absence of kidney: Secondary | ICD-10-CM | POA: Diagnosis not present

## 2017-08-29 DIAGNOSIS — N289 Disorder of kidney and ureter, unspecified: Secondary | ICD-10-CM

## 2017-08-29 DIAGNOSIS — K219 Gastro-esophageal reflux disease without esophagitis: Secondary | ICD-10-CM | POA: Insufficient documentation

## 2017-08-29 DIAGNOSIS — N179 Acute kidney failure, unspecified: Secondary | ICD-10-CM | POA: Insufficient documentation

## 2017-08-29 DIAGNOSIS — H9190 Unspecified hearing loss, unspecified ear: Secondary | ICD-10-CM

## 2017-08-29 DIAGNOSIS — Z7901 Long term (current) use of anticoagulants: Secondary | ICD-10-CM | POA: Insufficient documentation

## 2017-08-29 DIAGNOSIS — Z950 Presence of cardiac pacemaker: Secondary | ICD-10-CM | POA: Diagnosis not present

## 2017-08-29 DIAGNOSIS — I252 Old myocardial infarction: Secondary | ICD-10-CM | POA: Diagnosis not present

## 2017-08-29 DIAGNOSIS — Z87891 Personal history of nicotine dependence: Secondary | ICD-10-CM | POA: Diagnosis not present

## 2017-08-29 DIAGNOSIS — E538 Deficiency of other specified B group vitamins: Secondary | ICD-10-CM | POA: Insufficient documentation

## 2017-08-29 DIAGNOSIS — I2510 Atherosclerotic heart disease of native coronary artery without angina pectoris: Secondary | ICD-10-CM | POA: Insufficient documentation

## 2017-08-29 DIAGNOSIS — I130 Hypertensive heart and chronic kidney disease with heart failure and stage 1 through stage 4 chronic kidney disease, or unspecified chronic kidney disease: Secondary | ICD-10-CM | POA: Insufficient documentation

## 2017-08-29 DIAGNOSIS — Z888 Allergy status to other drugs, medicaments and biological substances status: Secondary | ICD-10-CM | POA: Diagnosis not present

## 2017-08-29 DIAGNOSIS — Z79899 Other long term (current) drug therapy: Secondary | ICD-10-CM | POA: Insufficient documentation

## 2017-08-29 DIAGNOSIS — E559 Vitamin D deficiency, unspecified: Secondary | ICD-10-CM | POA: Insufficient documentation

## 2017-08-29 DIAGNOSIS — I700 Atherosclerosis of aorta: Secondary | ICD-10-CM | POA: Diagnosis not present

## 2017-08-29 DIAGNOSIS — I480 Paroxysmal atrial fibrillation: Secondary | ICD-10-CM | POA: Insufficient documentation

## 2017-08-29 DIAGNOSIS — R918 Other nonspecific abnormal finding of lung field: Secondary | ICD-10-CM | POA: Diagnosis not present

## 2017-08-29 DIAGNOSIS — E785 Hyperlipidemia, unspecified: Secondary | ICD-10-CM | POA: Diagnosis not present

## 2017-08-29 DIAGNOSIS — Z96653 Presence of artificial knee joint, bilateral: Secondary | ICD-10-CM | POA: Diagnosis not present

## 2017-08-29 DIAGNOSIS — S8012XA Contusion of left lower leg, initial encounter: Secondary | ICD-10-CM | POA: Diagnosis not present

## 2017-08-29 DIAGNOSIS — S8011XA Contusion of right lower leg, initial encounter: Secondary | ICD-10-CM | POA: Diagnosis not present

## 2017-08-29 DIAGNOSIS — J449 Chronic obstructive pulmonary disease, unspecified: Secondary | ICD-10-CM

## 2017-08-29 HISTORY — DX: Dyspnea, unspecified: R06.00

## 2017-08-29 NOTE — ED Triage Notes (Signed)
Pt presents with multiple complaints. Pt states she has a "cut to R leg that has not stopped bleeding for days" Very small skin tear noted to R calf area. Also has hematoma noted to L inner foot area. Pt also states she thinks she needs a breathing tx, has hx of COPD.

## 2017-08-30 ENCOUNTER — Other Ambulatory Visit: Payer: Self-pay

## 2017-08-30 ENCOUNTER — Emergency Department (HOSPITAL_COMMUNITY): Payer: Medicare Other

## 2017-08-30 ENCOUNTER — Encounter (HOSPITAL_COMMUNITY): Payer: Self-pay | Admitting: General Practice

## 2017-08-30 DIAGNOSIS — G3184 Mild cognitive impairment, so stated: Secondary | ICD-10-CM

## 2017-08-30 DIAGNOSIS — R918 Other nonspecific abnormal finding of lung field: Secondary | ICD-10-CM | POA: Diagnosis not present

## 2017-08-30 DIAGNOSIS — H9190 Unspecified hearing loss, unspecified ear: Secondary | ICD-10-CM

## 2017-08-30 DIAGNOSIS — N289 Disorder of kidney and ureter, unspecified: Secondary | ICD-10-CM | POA: Diagnosis not present

## 2017-08-30 DIAGNOSIS — J441 Chronic obstructive pulmonary disease with (acute) exacerbation: Secondary | ICD-10-CM | POA: Diagnosis not present

## 2017-08-30 LAB — URINALYSIS, ROUTINE W REFLEX MICROSCOPIC
BILIRUBIN URINE: NEGATIVE
GLUCOSE, UA: NEGATIVE mg/dL
Hgb urine dipstick: NEGATIVE
KETONES UR: NEGATIVE mg/dL
NITRITE: NEGATIVE
PH: 5 (ref 5.0–8.0)
PROTEIN: NEGATIVE mg/dL
Specific Gravity, Urine: 1.016 (ref 1.005–1.030)

## 2017-08-30 LAB — BASIC METABOLIC PANEL
Anion gap: 8 (ref 5–15)
BUN: 57 mg/dL — AB (ref 8–23)
CO2: 27 mmol/L (ref 22–32)
Calcium: 9 mg/dL (ref 8.9–10.3)
Chloride: 104 mmol/L (ref 98–111)
Creatinine, Ser: 2.18 mg/dL — ABNORMAL HIGH (ref 0.44–1.00)
GFR calc Af Amer: 22 mL/min — ABNORMAL LOW (ref >60)
GFR calc non Af Amer: 19 mL/min — ABNORMAL LOW (ref >60)
GLUCOSE: 111 mg/dL — AB (ref 70–99)
Potassium: 4.9 mmol/L (ref 3.5–5.1)
Sodium: 139 mmol/L (ref 135–145)

## 2017-08-30 LAB — CBC
HEMATOCRIT: 40.1 % (ref 36.0–46.0)
HEMOGLOBIN: 12.2 g/dL (ref 12.0–15.0)
MCH: 28.8 pg (ref 26.0–34.0)
MCHC: 30.4 g/dL (ref 30.0–36.0)
MCV: 94.8 fL (ref 78.0–100.0)
Platelets: 227 10*3/uL (ref 150–400)
RBC: 4.23 MIL/uL (ref 3.87–5.11)
RDW: 14.7 % (ref 11.5–15.5)
WBC: 9.2 10*3/uL (ref 4.0–10.5)

## 2017-08-30 LAB — BRAIN NATRIURETIC PEPTIDE: B Natriuretic Peptide: 319.1 pg/mL — ABNORMAL HIGH (ref 0.0–100.0)

## 2017-08-30 MED ORDER — ATORVASTATIN CALCIUM 20 MG PO TABS
20.0000 mg | ORAL_TABLET | Freq: Every day | ORAL | Status: DC
  Administered 2017-08-30: 20 mg via ORAL
  Filled 2017-08-30: qty 1

## 2017-08-30 MED ORDER — IPRATROPIUM BROMIDE 0.02 % IN SOLN
0.5000 mg | Freq: Once | RESPIRATORY_TRACT | Status: AC
  Administered 2017-08-30: 0.5 mg via RESPIRATORY_TRACT
  Filled 2017-08-30: qty 2.5

## 2017-08-30 MED ORDER — METOPROLOL TARTRATE 12.5 MG HALF TABLET
12.5000 mg | ORAL_TABLET | Freq: Two times a day (BID) | ORAL | Status: DC
  Administered 2017-08-30: 12.5 mg via ORAL
  Filled 2017-08-30: qty 1

## 2017-08-30 MED ORDER — PREDNISONE 20 MG PO TABS
60.0000 mg | ORAL_TABLET | Freq: Once | ORAL | Status: AC
  Administered 2017-08-30: 60 mg via ORAL
  Filled 2017-08-30: qty 3

## 2017-08-30 MED ORDER — APIXABAN 2.5 MG PO TABS
2.5000 mg | ORAL_TABLET | Freq: Two times a day (BID) | ORAL | Status: DC
  Administered 2017-08-30: 2.5 mg via ORAL
  Filled 2017-08-30: qty 1

## 2017-08-30 MED ORDER — METOPROLOL SUCCINATE ER 25 MG PO TB24
25.0000 mg | ORAL_TABLET | Freq: Every day | ORAL | Status: DC
  Filled 2017-08-30: qty 1

## 2017-08-30 MED ORDER — ALBUTEROL SULFATE (2.5 MG/3ML) 0.083% IN NEBU: 5.0000 mg | INHALATION_SOLUTION | Freq: Once | RESPIRATORY_TRACT | Status: DC

## 2017-08-30 MED ORDER — ALBUTEROL SULFATE (2.5 MG/3ML) 0.083% IN NEBU: 3.0000 mL | INHALATION_SOLUTION | RESPIRATORY_TRACT | Status: DC | PRN

## 2017-08-30 MED ORDER — AMIODARONE HCL 200 MG PO TABS
200.0000 mg | ORAL_TABLET | Freq: Every day | ORAL | Status: DC
  Administered 2017-08-30: 200 mg via ORAL
  Filled 2017-08-30 (×2): qty 1

## 2017-08-30 MED ORDER — PREDNISONE 50 MG PO TABS: 50.0000 mg | ORAL_TABLET | Freq: Every day | ORAL | 0 refills | Status: DC

## 2017-08-30 MED ORDER — CALCIUM CARBONATE 1250 (500 CA) MG PO TABS
500.0000 mg | ORAL_TABLET | Freq: Two times a day (BID) | ORAL | Status: DC
  Administered 2017-08-30: 500 mg via ORAL
  Filled 2017-08-30: qty 1

## 2017-08-30 MED ORDER — IPRATROPIUM-ALBUTEROL 0.5-2.5 (3) MG/3ML IN SOLN
3.0000 mL | Freq: Four times a day (QID) | RESPIRATORY_TRACT | Status: DC
  Administered 2017-08-30: 3 mL via RESPIRATORY_TRACT
  Filled 2017-08-30: qty 3

## 2017-08-30 MED ORDER — VITAMIN D 1000 UNITS PO TABS
1000.0000 [IU] | ORAL_TABLET | Freq: Every day | ORAL | Status: DC
  Administered 2017-08-30: 1000 [IU] via ORAL
  Filled 2017-08-30: qty 1

## 2017-08-30 MED ORDER — PANTOPRAZOLE SODIUM 40 MG PO TBEC
40.0000 mg | DELAYED_RELEASE_TABLET | Freq: Every day | ORAL | Status: DC
  Administered 2017-08-30: 40 mg via ORAL
  Filled 2017-08-30: qty 1

## 2017-08-30 MED ORDER — CEFDINIR 300 MG PO CAPS
300.0000 mg | ORAL_CAPSULE | Freq: Every day | ORAL | Status: DC
  Administered 2017-08-30: 300 mg via ORAL
  Filled 2017-08-30: qty 1

## 2017-08-30 MED ORDER — ASPIRIN 81 MG PO CHEW
81.0000 mg | CHEWABLE_TABLET | Freq: Every day | ORAL | Status: DC
  Administered 2017-08-30: 81 mg via ORAL
  Filled 2017-08-30: qty 1

## 2017-08-30 MED ORDER — ALBUTEROL SULFATE (2.5 MG/3ML) 0.083% IN NEBU
5.0000 mg | INHALATION_SOLUTION | Freq: Once | RESPIRATORY_TRACT | Status: AC
  Administered 2017-08-30: 5 mg via RESPIRATORY_TRACT
  Filled 2017-08-30: qty 6

## 2017-08-30 MED ORDER — CEFDINIR 300 MG PO CAPS: 300.0000 mg | ORAL_CAPSULE | Freq: Every day | ORAL | 0 refills | Status: DC

## 2017-08-30 MED ORDER — SODIUM CHLORIDE 0.9 % IV SOLN
INTRAVENOUS | Status: DC
  Administered 2017-08-30: 08:00:00 via INTRAVENOUS

## 2017-08-30 MED ORDER — IPRATROPIUM-ALBUTEROL 0.5-2.5 (3) MG/3ML IN SOLN
3.0000 mL | RESPIRATORY_TRACT | Status: DC
  Administered 2017-08-30: 3 mL via RESPIRATORY_TRACT
  Filled 2017-08-30: qty 3

## 2017-08-30 MED ORDER — PREDNISONE 50 MG PO TABS: 50.0000 mg | ORAL_TABLET | Freq: Every day | ORAL | Status: DC

## 2017-08-30 NOTE — ED Notes (Addendum)
Pt ambulatory to bathroom but reports feeling extreme SOB upon walking back to room

## 2017-08-30 NOTE — ED Provider Notes (Signed)
[facility] EMERGENCY DEPARTMENT Provider Note   CSN: [id number] Arrival date & time: 08/29/17  2314     History   Chief Complaint Chief Complaint  Patient presents with  . Multiple Complaints    HPI [name] is a 84 y.o. female.  The history is provided by the patient and a relative.  Shortness of Breath  This is a recurrent problem. The problem occurs frequently.The problem has been gradually worsening. Associated symptoms include cough, wheezing and leg swelling. Pertinent negatives include no fever and no chest pain. She has tried oral steroids for the symptoms. The treatment provided mild relief. Associated medical issues include COPD.  pt With history of COPD presents with multiple complaints. Reports over the past several days she has had increasing cough as well as shortness of breath.  She also reports dyspnea on exertion.  She is not using home oxygen.  She was recently tried on oral steroids by her pulmonologist, with  mild improvement She Is a non-smoker. She also reports increasing lower extremity swelling.  She also reports bruising to her legs.  She also reports she is leaking fluid from her legs.  she takes eliquis for atrial fibrillation, but does not always take her full dosing.  Past Medical History:  Diagnosis Date  . Abnormal mammogram, unspecified 08/23/2010   Followup imaging reassuring.   . Acute appendicitis with rupture   . ADENOMATOUS COLONIC POLYP 03/01/2003   Qualifier: Diagnosis of  By: McDiarmid MD, Sherren Mocha Multiple benign polyps of cecum, ascending, transverse and sigmoid colon by 8/09 colonoscopy by Dr Cristina Gong  Colonoscopy by Dr Cristina Gong for iron-deficiency anemia on 04/27/2010 showed three sessile polyps that were in ascending (3 mm x 9 mm), transverse (4 mm), and cecum (3 mm).  All three were tubular adenomas that were negative for high grade dysplasia or malignancy on pathology. Dr Cristina Gong called the polpys benign and not requiring  follow-up in view of the patients age.    . Adrenal adenoma    Incidentaloma  . AF (paroxysmal atrial fibrillation) ([place]) 11/11/2010   Hospitalization (9/8-9/10, Dr Daneen Schick, III, Cardiology) for Paroxysmal Atrial Fibrillation with RVR and anginal pain secondary to demand/supply mismatch in setting of RVR with known circumflex artery branch disease.    Marland Kitchen ANXIETY 04/27/2006   Qualifier: Diagnosis of  By: McDiarmid MD, Sherren Mocha    . Benign essential tremor 02/04/2016  . Candidiasis of the esophagus 11/26/2007  . CAP (community acquired pneumonia) 02/25/2015  . Cataract 2013   Bilateral   . Cellulitis of leg, right 10/01/2012  . Cellulitis of right lower extremity   . Chest pain with moderate risk of acute coronary syndrome 05/21/2015  . Chronic kidney disease (CKD), stage III (moderate) (HCC)   . Concussion with loss of consciousness   . COPD 04/27/2006   Qualifier: Diagnosis of  By: McDiarmid MD, Sherren Mocha    . COPD exacerbation ([place])   . COPD, severe   . Decreased functional mobility and endurance 03/17/2015  . Diastolic heart failure ([place]) 07/30/2015  . DISC WITH RADICULOPATHY 04/27/2006   Qualifier: Diagnosis of  By: McDiarmid MD, Sherren Mocha    . EDEMA-LEGS,DUE TO VENOUS OBSTRUCT. 04/27/2006   Qualifier: Diagnosis of  By: McDiarmid MD, Sherren Mocha    . 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 Normal uric acid serum level at time of attack.    Marland Kitchen HERNIA, HIATAL, NONCONGENITAL 04/27/2006  Qualifier: Diagnosis of  By: McDiarmid MD, Sherren Mocha    . High risk medications (not anticoagulants) long-term use 03/05/2012  . History of Hemorrhoids 04/27/2006   Qualifier: Diagnosis of  By: McDiarmid MD, Sherren Mocha    . History of iron deficiency 01/16/2015  . Hx of colonoscopy with polypectomy 04/27/2010   Dr Cristina Gong found three  tubular adenomas each less than 10 mm size  . Hypertension   . Hypotension 07/09/2015  . Iron deficiency anemia 08/05/2010   Dr  Cristina Gong (GI) has evaluated with EGD, colonoscopy, and video capsular endoscopy in 2011 & 2012.  All have been unrevealing as to an origin of IDA.  OV with Dr Cristina Gong (10/28/10) assessment of blood in stool per hemoccult and GER. Hbg 12.1 g/dL, MCV 91.8, Ferritin 30 ng/mL. Patient taking on ferrous sulfate tab daily.   EGD on 03/06/12 by Dr Cristina Gong for IDA non-obstructing Schatzki's ring at Gastroesophageal junction, otherwise normal esophagus and stomach.     . Leg cramps 05/29/2012  . Lumbar herniated disc    History of HNP L4/5 in 2003  . Macular degeneration, bilateral 10/04/2010   Right eye is wet MD, the other is dry macular degeneration (ARMD). Pt undergoing some form of vascular endothelial growth factor inhibition intraocular therapy.    . Mild cognitive impairment 10/26/2012   (10/25/12) Failed MiniCog screen  . MUSCLE CRAMPS 03/11/2010   Qualifier: Diagnosis of  By: McDiarmid MD, Sherren Mocha    . Muscle spasm of back 09/24/2013  . Myocardial infarct, old   . Nocturia 10/28/2016  . Numbness and tingling in hands 07/21/2011  . Pacemaker    MDT  . PREDIABETES 09/11/2007   Qualifier: Diagnosis of  By: McDiarmid MD, Sherren Mocha    . Retinal hemorrhage of left eye 06/2010  . RHINITIS, ALLERGIC 04/27/2006   Qualifier: Diagnosis of  By: McDiarmid MD, Sherren Mocha    . SCHATZKI'S RING, HX OF 11/26/2007   Qualifier: Diagnosis of  By: McDiarmid MD, Sherren Mocha  An EGD was performed by Dr Cristina Gong on 04/27/2010 for iron deficiency anemia. There was a a transient hiatal hernia with Schatzki's ring. Stomach and duodenum were normal. EGD on 03/06/12 by Dr Cristina Gong for IDA non-obstructing Schatzki's ring at Gastroesophageal junction, otherwise normal esophagus and stomach.    . Seborrheic keratosis, right anterior thigh 12/12/2013  . Shoulder pain, left 01/09/2014  . Sick sinus syndrome with tachycardia ([place])    MDT  . Soft tissue injury of foot 05/03/2011  . Solar lentigo 06/15/2012  . Solitary kidney, acquired 05/17/2010   Surgical removal for  transitional cell cancer by Tresa Endo, MD ([place]). Surveillance cystoscopy by Dr Alinda Money [facility]) on 10/19/12 without evidence of cystoscopic recurrence. Recommend RTC one year for cystoscopy.   Marland Kitchen Spinal stenosis, lumbar   . Transitional cell carcinoma of ureter, history   . Urge incontinence 12/13/2011   Diagnosed in 10/2011 by Dr Bjorn Loser (Urology)   . VENTRICULAR HYPERTROPHY, LEFT 08/28/2008   Qualifier: Diagnosis of  By: McDiarmid MD, Sherren Mocha    . VITAMIN B12 DEFICIENCY 10/07/2009   Qualifier: Diagnosis of  By: McDiarmid MD, Sherren Mocha  Dx based on a post-TKR anemia work-up Low normal serum B12 with high Methylmalonic acid and homocysteine level  Vit B12 serum level (10/28/10) > 1500 pg/mL   . Vitamin D deficiency 11/02/2010   Serum vitamin D 25(OH) = 10.9 ng/mL (30 -100) on 10/28/10 c/w Vitamin D deficiency.       Patient Active Problem List   Diagnosis Date Noted  .  Other fatigue 06/30/2017  . Bilateral leg edema 01/10/2017  . Nocturia 10/28/2016  . Fall 10/27/2016  . Abnormality of gait 07/28/2016  . Chronic diastolic CHF (congestive heart failure) ([place]) 11/08/2015  . GERD (gastroesophageal reflux disease) 11/08/2015  . Hearing loss 10/16/2015  . On amiodarone therapy 09/14/2015  . Hypotension 07/09/2015  . Paroxysmal atrial fibrillation (HCC)   . Pure hypercholesterolemia 07/31/2014  . CAD S/P RCA BMS, residual CFX disease 06/08/2014  . Pacemaker 04/02/2013  . Mild cognitive impairment 10/26/2012  . Chronic anticoagulation 10/01/2012  . Urge incontinence 12/13/2011  . Vitamin D deficiency 11/02/2010  . Macular degeneration, bilateral 10/04/2010    Class: Chronic  . Iron deficiency anemia due to chronic blood loss 08/05/2010  . Solitary kidney, acquired 05/17/2010  . Spondylolisthesis of lumbar region   . VITAMIN B12 DEFICIENCY 10/07/2009  . Chronic kidney disease (CKD), stage IV (severe) ([place]) 09/02/2009  . MYOCARDIAL INFARCTION, HX OF 09/25/2008  . VENTRICULAR  HYPERTROPHY, LEFT 08/28/2008  . At risk for diabetes mellitus 09/11/2007  . Anxiety disorder due to general medical condition with panic attack 04/27/2006  . SICK SINUS SYNDROME 04/27/2006  . Allergic rhinitis, seasonal 04/27/2006  . COPD, severe ([place]) 04/27/2006  . GASTROESOPHAGEAL REFLUX, NO ESOPHAGITIS 04/27/2006  . Osteoarthrosis involving lower leg 04/27/2006  . LUMBAR SPINAL STENOSIS 04/27/2006    Past Surgical History:  Procedure Laterality Date  . BREAST SURGERY     breast reduction  . CHOLECYSTECTOMY    . CORONARY ANGIOPLASTY WITH STENT PLACEMENT  2010   BMS RCA, OM2 occluded  . CYSTOSCOPY  09/30/2015   No cystoscopic evidence of uroepithelial neoplasm.  Follow up surveillance cystoscopy in one year  . DUPUYTREN CONTRACTURE RELEASE Right 05/22/2014   Procedure: DUPUYTREN RELEASE AND REPAIR AS NECESSARY RIGHT RING FINGER AND MIDDLE FINGER;  Surgeon: Roseanne Kaufman, MD;  Location: [place];  Service: Orthopedics;  Laterality: Right;  . ESOPHAGOGASTRODUODENOSCOPY  04/27/2010   Dr Cristina Gong - found transient H/H & Schatzki's ring  . ESOPHAGOGASTRODUODENOSCOPY ENDOSCOPY  03/06/2012   Dr Cristina Gong - found non-obstuctive Schatzki's ring at [employer] [facility] o/w normal EGD.   Marland Kitchen EYE SURGERY     bilateral cataracts  . KNEE ARTHROSCOPY W/ SYNOVECTOMY  11/2009, left knee   Dr Wynelle Link  . NEPHRECTOMY  For transition cell cancer    Dr Tresa Endo, surgeon  . PACEMAKER GENERATOR CHANGE N/A 11/12/2012   Procedure: PACEMAKER GENERATOR CHANGE;  Surgeon: Evans Lance, MD; [employer] [facility] dual-chamber pacemaker serial number [id number]; Laterality: Right  . PACEMAKER INSERTION  2005   Dr Doreatha Lew  . PACEMAKER LEAD REMOVAL  2005   Removal and reinsertion of atrial and ventricular leads due to migration  . REPLACEMENT TOTAL KNEE  2009, Right knee   Dr Wynelle Link  . REPLACEMENT TOTAL KNEE  05/2009, Left knee   Dr Wynelle Link  . TONSILLECTOMY    . TRIGGER FINGER RELEASE Right 05/22/2014   Procedure: RIGHT HAND A-1  PULLEY RELEASE ;  Surgeon: Roseanne Kaufman, MD;  Location: [facility];  Service: Orthopedics;  Laterality: Right;     OB History   None      Home Medications    Prior to Admission medications   Medication Sig Start Date End Date Taking? Authorizing Provider  albuterol (VENTOLIN HFA) 108 (90 Base) MCG/ACT inhaler For 5 days, then every 6 hours as needed for cough or shortness of breath or wheezing. 12/15/16   McDiarmid, Blane Ohara, MD  amiodarone (PACERONE) 200 MG tablet TAKE 1 TABLET(200  MG) BY MOUTH DAILY 06/09/17   Belva Crome, MD  aspirin 81 MG chewable tablet Chew 81 mg by mouth daily.    [provider]  atorvastatin (LIPITOR) 20 MG tablet TAKE 1 TABLET BY MOUTH DAILY 12/19/16   McDiarmid, Blane Ohara, MD  calcium carbonate (OSCAL) 1500 (600 Ca) MG TABS tablet Take 600 mg of elemental calcium by mouth 2 (two) times daily with a meal.    [provider]  Cholecalciferol 1000 units tablet Take 1 tablet by mouth daily.     [provider]  ELIQUIS 2.5 MG TABS tablet TAKE 1 TABLET BY MOUTH TWICE DAILY 04/28/17   McDiarmid, Blane Ohara, MD  FIBER DIET TABS Take 2 tablets by mouth daily.    [provider]  fluticasone (FLONASE) 50 MCG/ACT nasal spray SHAKE LIQUID AND USE 2 SPRAYS IN EACH NOSTRIL DAILY 01/31/17   Collene Gobble, MD  furosemide (LASIX) 40 MG tablet TAKE 1/2 TABLET(20 MG) BY MOUTH DAILY AS NEEDED FOR FLUID RETENTION OR SWELLING 07/03/17   McDiarmid, Blane Ohara, MD  ipratropium-albuterol (DUONEB) 0.5-2.5 (3) MG/3ML SOLN USE 3 ML VIA NEBULIZER EVERY 4 HOURS 08/25/17   Byrum, Rose Fillers, MD  metoprolol succinate (TOPROL-XL) 25 MG 24 hr tablet TAKE 1 TABLET(25 MG) BY MOUTH DAILY 02/16/17   McDiarmid, Blane Ohara, MD  nitroGLYCERIN (NITROSTAT) 0.4 MG SL tablet Place 1 tablet (0.4 mg total) under the tongue every 5 (five) minutes as needed for chest pain (x 3 tabs). 11/16/15   McDiarmid, Blane Ohara, MD  omeprazole (PRILOSEC) 20 MG capsule TAKE 1 CAPSULE(20 MG) BY MOUTH DAILY 06/09/17    McDiarmid, Blane Ohara, MD  predniSONE (DELTASONE) 20 MG tablet TAKE 2 TABLETS(40 MG) BY MOUTH DAILY WITH BREAKFAST 08/10/17   McDiarmid, Blane Ohara, MD  SENNOSIDES PO Take 2 tablets by mouth daily.    [provider]  traMADol (ULTRAM) 50 MG tablet TAKE 1 TABLET BY MOUTH EVERY 6 HOURS AS NEEDED FOR PAIN 03/17/17   McDiarmid, Blane Ohara, MD  TRELEGY ELLIPTA 100-62.5-25 MCG/INH AEPB INHALE 1 PUFF BY MOUTH ONCE DAILY Patient not taking: Reported on 08/28/2017 04/27/17   Collene Gobble, MD  vitamin B-12 (CYANOCOBALAMIN) 1000 MCG tablet Take 1,000 mcg by mouth daily.    [provider]    Family History Family History  Problem Relation Age of Onset  . Dementia Mother   . Heart disease Father   . Heart attack Father     Social History Social History   Tobacco Use  . Smoking status: Former Smoker    Packs/day: 1.00    Types: Cigarettes    Last attempt to quit: 03/02/2009    Years since quitting: 8.5  . Smokeless tobacco: Never Used  Substance Use Topics  . Alcohol use: Yes    Alcohol/week: 7.8 oz    Types: 6 Standard drinks or equivalent, 7 Glasses of wine per week    Comment: 5 oz white wine per day  . Drug use: No     Allergies   Baclofen; Codeine phosphate; Norco [hydrocodone-acetaminophen]; Hydrochlorothiazide; and Montelukast sodium   Review of Systems Review of Systems  Constitutional: Negative for fever.  Respiratory: Positive for cough, shortness of breath and wheezing.   Cardiovascular: Positive for leg swelling. Negative for chest pain.  Skin: Positive for wound.  Hematological: Bruises/bleeds easily.  All other systems reviewed and are negative.    Physical Exam Updated Vital Signs BP (!) 147/132   Pulse 87   Temp (!)  97.5 F (36.4 C) (Oral)   Resp 18   Ht 1.651 m (5\' 5" )   Wt 79.4 kg (175 lb)   SpO2 93%   BMI 29.12 kg/m   Physical Exam CONSTITUTIONAL: Elderly, no acute distress HEAD: Normocephalic/atraumatic EYES: EOMI/PERRL ENMT: Mucous  membranes moist NECK: supple no meningeal signs SPINE/BACK:entire spine nontender CV: Irregular, no loud murmurs LUNGS: Wheezing bilaterally, mild tachypnea ABDOMEN: soft, nontender, no rebound or guarding, bowel sounds noted throughout abdomen GU:no cva tenderness NEURO: Pt is awake/alert/appropriate, moves all extremitiesx4.  No facial droop.   EXTREMITIES: pulses normal/equal by doppler, symmetric edema to bilateral lower extremities, scattered bruising noted lower extremities, no focal tenderness or deformities.   SKIN: warm, color normal PSYCH: no abnormalities of mood noted, alert and oriented to situation   ED Treatments / Results  Labs (all labs ordered are listed, but only abnormal results are displayed) Labs Reviewed  BASIC METABOLIC PANEL - Abnormal; Notable for the following components:      Result Value   Glucose, Bld 111 (*)    BUN 57 (*)    Creatinine, Ser 2.18 (*)    GFR calc non Af Amer 19 (*)    GFR calc Af Amer 22 (*)    All other components within normal limits  URINALYSIS, ROUTINE W REFLEX MICROSCOPIC - Abnormal; Notable for the following components:   APPearance HAZY (*)    Leukocytes, UA MODERATE (*)    Bacteria, UA RARE (*)    Non Squamous Epithelial 0-5 (*)    All other components within normal limits  BRAIN NATRIURETIC PEPTIDE - Abnormal; Notable for the following components:   B Natriuretic Peptide 319.1 (*)    All other components within normal limits  CBC    EKG EKG Interpretation  Date/Time:  Tuesday August 29 2017 23:44:49 EDT Ventricular Rate:  98 PR Interval:    QRS Duration: 130 QT Interval:  380 QTC Calculation: 485 R Axis:   -38 Text Interpretation:  Atrial fibrillation Left axis deviation Left bundle branch block Abnormal ECG No significant change since last tracing Confirmed by Ripley Fraise [id number]) on 08/30/2017 2:40:50 AM   Radiology Dg Chest 2 View  Result Date: 08/30/2017 CLINICAL DATA:  Edema in the legs EXAM: CHEST - 2  VIEW COMPARISON:  08/24/2017, 08/16/2016 FINDINGS: Right-sided pacing device as before. Hyperinflation with chronic interstitial opacity. Platelike atelectasis at the right infrahilar lung. Stable cardiomegaly. Scarring at the bilateral lung bases. No pleural effusion. Aortic atherosclerosis. IMPRESSION: No active cardiopulmonary disease. Stable cardiomegaly and hyperinflation with chronic interstitial opacities at the bases. Electronically Signed   By: Donavan Foil M.D.   On: 08/30/2017 00:14    Procedures Procedures (including critical care time)  Medications Ordered in ED Medications  albuterol (PROVENTIL) (2.5 MG/3ML) 0.083% nebulizer solution 5 mg (has no administration in time range)  predniSONE (DELTASONE) tablet 60 mg (has no administration in time range)  albuterol (PROVENTIL) (2.5 MG/3ML) 0.083% nebulizer solution 5 mg (5 mg Nebulization Given 08/30/17 0421)  ipratropium (ATROVENT) nebulizer solution 0.5 mg (0.5 mg Nebulization Given 08/30/17 0421)     Initial Impression / Assessment and Plan / ED Course  I have reviewed the triage vital signs and the nursing notes.  Pertinent labs & imaging results that were available during my care of the patient were reviewed by me and considered in my medical decision making (see chart for details).    4:05 AM Pt with Multiple complaints, but her main issue appears to be wheezing from  her COPD She reports increasing dyspnea on exertion, therefore likely will need to be admitted.  She is declining steroids at this time.  She is also concerned she may have DVT, but on my exam her edema appears to be fairly symmetric.  Bruising likely due to the fact she is using Eliquis.  No significant fluid leakage noted at this time 4:49 AM Patient still with wheezing, and had dyspnea upon walking to the bathroom.  I feel it is safer for her to be admitted to the hospital for COPD.  Patient agreeable.  She now agrees to take prednisone. Discussed the case with  on-call family medicine resident. Final Clinical Impressions(s) / ED Diagnoses   Final diagnoses:  COPD exacerbation [facility])  Renal insufficiency    ED Discharge Orders    None       Ripley Fraise, MD 08/30/17 [phone]

## 2017-08-30 NOTE — ED Notes (Addendum)
Wound care and Xerform and gauze applied to RLE.

## 2017-08-30 NOTE — Evaluation (Signed)
Physical Therapy Evaluation Patient Details Name: [name] MRN: [id number] DOB: [birth year] Today's Date: 08/30/2017   History of Present Illness  Pt is a 84 y.o. F with significant PMH of COPD, atrial fibrillation, HLD, GERD, CKD and nephrectomy. Presenting with shortness of breath.  Clinical Impression  Pt admitted with above diagnosis. Pt currently with functional limitations due to the deficits listed below (see PT Problem List). Patient is independent at baseline with ADL's and mobility. Patient ambulating 60 feet with one person hand held assist. Further ambulation limited by patient dizziness; BP 126/70 and no nystagmus noted. 92% SpO2 at rest on RA and maintained 92% SpO2 after mobility. Pt will benefit from skilled PT to increase their independence and safety with mobility to allow discharge to the venue listed below.       Follow Up Recommendations Home health PT;Supervision for mobility/OOB    Equipment Recommendations  None recommended by PT    Recommendations for Other Services       Precautions / Restrictions Precautions Precautions: Fall Restrictions Weight Bearing Restrictions: No      Mobility  Bed Mobility Overal bed mobility: Modified Independent                Transfers Overall transfer level: Needs assistance Equipment used: None Transfers: Sit to/from Stand Sit to Stand: Supervision            Ambulation/Gait Ambulation/Gait assistance: Min guard Gait Distance (Feet): 60 Feet Assistive device: 1 person hand held assist Gait Pattern/deviations: Step-through pattern;Narrow base of support;Drifts right/left Gait velocity: decreased   General Gait Details: Patient with unsteadiness ambulating in hallway, stating she felt "fuzzy headed." BP 126/70, SpO2 92% on RA.   Stairs            Wheelchair Mobility    Modified Rankin (Stroke Patients Only)       Balance Overall balance assessment: Needs assistance Sitting-balance  support: No upper extremity supported;Feet supported Sitting balance-Leahy Scale: Normal     Standing balance support: No upper extremity supported;During functional activity Standing balance-Leahy Scale: Fair                               Pertinent Vitals/Pain Pain Assessment: No/denies pain    Home Living Family/patient expects to be discharged to:: Private residence Living Arrangements: Children(daughter) Available Help at Discharge: Family;Available PRN/intermittently Type of Home: House Home Access: Stairs to enter Entrance Stairs-Rails: Right Entrance Stairs-Number of Steps: 2 Home Layout: Two level Home Equipment: Environmental consultant - 2 wheels Additional Comments: lives with her daughter who is a Catering manager    Prior Function Level of Independence: Independent         Comments: Enjoys going to the beach with her family . "leans on the cart" when she goes grocery shopping.     Hand Dominance        Extremity/Trunk Assessment   Upper Extremity Assessment Upper Extremity Assessment: Defer to OT evaluation    Lower Extremity Assessment Lower Extremity Assessment: Overall WFL for tasks assessed       Communication   Communication: No difficulties  Cognition Arousal/Alertness: Awake/alert Behavior During Therapy: WFL for tasks assessed/performed Overall Cognitive Status: Within Functional Limits for tasks assessed                                        General  Comments      Exercises     Assessment/Plan    PT Assessment Patient needs continued PT services  PT Problem List Decreased activity tolerance;Decreased balance;Decreased mobility       PT Treatment Interventions DME instruction;Gait training;Stair training;Functional mobility training;Therapeutic activities;Therapeutic exercise;Balance training;Patient/family education    PT Goals (Current goals can be found in the Care Plan section)  Acute Rehab PT Goals Patient  Stated Goal: go home PT Goal Formulation: With patient Time For Goal Achievement: 09/13/17 Potential to Achieve Goals: Good    Frequency Min 3X/week   Barriers to discharge        Co-evaluation               AM-PAC PT "6 Clicks" Daily Activity  Outcome Measure Difficulty turning over in bed (including adjusting bedclothes, sheets and blankets)?: None Difficulty moving from lying on back to sitting on the side of the bed? : None Difficulty sitting down on and standing up from a chair with arms (e.g., wheelchair, bedside commode, etc,.)?: A Little Help needed moving to and from a bed to chair (including a wheelchair)?: A Little Help needed walking in hospital room?: A Little Help needed climbing 3-5 steps with a railing? : A Little 6 Click Score: 20    End of Session Equipment Utilized During Treatment: Gait belt Activity Tolerance: Other (comment)(limited by dizziness) Patient left: in chair;with call bell/phone within reach Nurse Communication: Mobility status PT Visit Diagnosis: Unsteadiness on feet (R26.81);Difficulty in walking, not elsewhere classified (R26.2)    Time: 0263-7858 PT Time Calculation (min) (ACUTE ONLY): 37 min   Charges:   PT Evaluation $PT Eval Moderate Complexity: 1 Mod PT Treatments $Therapeutic Activity: 8-22 mins   PT G Codes:        Ellamae Sia, PT, DPT Acute Rehabilitation Services  Pager: [phone]   Willy Eddy 08/30/2017, 12:43 PM

## 2017-08-30 NOTE — Progress Notes (Signed)
OT Cancellation Note  Patient Details Name: [name] MRN: [id number] DOB: May 04, 1923   Cancelled Treatment:    Reason Eval/Treat Not Completed: Other (comment): Second attempt made for OT evaluation this morning. Pt working with admissions nurse and then with PT. Will check back as able to complete evaluation.  Norman Herrlich, MS OTR/L  Pager: [name] 08/30/2017, 10:22 AM

## 2017-08-30 NOTE — Discharge Instructions (Signed)
Do not take lasix without speaking to Dr. McDiarmid or one of the other doctors at the family medicine clinic.  You can can for an appt if you feel the need before your next scheduled time with Dr. McDiarmid.  You also have an appt with Dr. Valentina Lucks to do a lung function test to try and make sure you aren't having a side effect from amiodarone.  Please discuss restarting the trelegy with your lung doctor when you see them next.  You should use the duonebs in the nebulizer until then.

## 2017-08-30 NOTE — Care Management Note (Addendum)
Case Management Note  Patient Details  Name: [name] MRN: [id number] Date of Birth: 1923-11-28  Subjective/Objective:               Patient from home, has a daughter that is a flight attendant that stays with her, but is away a lot. in obs for SOB. Followed by [facility] community CM. Difficulty obtaining nebulizer prior to admission, not taking Eliquis as prescribed. CM will continue to follow   Patient states that she plans on going home with her daughter tonight and tomorrow night then they leave for a cruise Saturday morning. She is arranging a potential friend to stay with her after that. SHe describes herself as independent, she declines home health at this time. She has a walker and "all the necessities" at home already.              Action/Plan: Anticipate return to home.     Expected Discharge Date:                  Expected Discharge Plan:  [place]  In-House Referral:     Discharge planning Services  CM Consult  Post Acute Care Choice:    Choice offered to:     DME Arranged:    DME Agency:     HH Arranged:    [place] Agency:     Status of Service:  In process, will continue to follow  If discussed at Long Length of Stay Meetings, dates discussed:    Additional Comments:  Carles Collet, RN 08/30/2017, 8:09 AM

## 2017-08-30 NOTE — Care Management Obs Status (Signed)
[place] NOTIFICATION   Patient Details  Name: [name] MRN: [id number] Date of Birth: 04-19-1923   Medicare Observation Status Notification Given:  Yes    Carles Collet, RN 08/30/2017, 2:50 PM

## 2017-08-30 NOTE — Progress Notes (Signed)
Patient discharged to home, AVS including medications reviewed with patient and daughter. Patient made aware of follow up appointments. IV removed and telebox returned. Patient left floor via wheelchair with staff and family member

## 2017-08-30 NOTE — Evaluation (Signed)
Occupational Therapy Evaluation Patient Details Name: SARIT SPARANO MRN: [id number] DOB: 07-Oct-1923 Today's Date: 08/30/2017    History of Present Illness Pt is a 84 y.o. F with significant PMH of COPD, atrial fibrillation, HLD, GERD, CKD and nephrectomy. Presenting with shortness of breath.   Clinical Impression   PTA, pt was independent with ADL and functional mobility. She has assistance for cleaning but is able to complete all meal preparation. During community mobility, she uses a rollator. Pt currently limited by decreased activity tolerance for ADL participation, instability on her feet, and dizziness during functional mobility. Pt with noted dyspnea on exertion 2/4 which improved with rest and SpO2 96% on RA. Pt requiring min guard to min assist for toilet transfers, supervision for standing grooming tasks, and supervision for LB ADL Feel pt would benefit from continued OT services while admitted to improve independence and safety with ADL and functional mobility. Recommend home health OT follow-up post-acute D/C as well as initial 24 hour assistance. Pt and daughter educated concerning recommendations.     Follow Up Recommendations  Home health OT;Supervision/Assistance - 24 hour(initial 24 hour assistance)    Equipment Recommendations  None recommended by OT    Recommendations for Other Services       Precautions / Restrictions Precautions Precautions: Fall Restrictions Weight Bearing Restrictions: No      Mobility Bed Mobility Overal bed mobility: Modified Independent                Transfers Overall transfer level: Needs assistance Equipment used: None Transfers: Sit to/from Stand Sit to Stand: Supervision         General transfer comment: Pt able to power up to standing with and without RW with supervision. Min guard to min assist once ambulating.     Balance Overall balance assessment: Needs assistance Sitting-balance support: No upper extremity  supported;Feet supported Sitting balance-Leahy Scale: Normal     Standing balance support: No upper extremity supported;During functional activity Standing balance-Leahy Scale: Fair Standing balance comment: Pt statically able to sustain balance without UE support. Benefits from B UE support from RW or physical assistance.                            ADL either performed or assessed with clinical judgement   ADL Overall ADL's : Needs assistance/impaired Eating/Feeding: Set up;Sitting   Grooming: Supervision/safety;Standing;Oral care Grooming Details (indicate cue type and reason): pt leaning on elbows over sink and reports "I don't know" when asked if she does this at home.  Upper Body Bathing: Set up;Sitting   Lower Body Bathing: Supervison/ safety;Sit to/from stand   Upper Body Dressing : Set up;Sitting   Lower Body Dressing: Supervision/safety;Sit to/from stand   Toilet Transfer: Min guard;Ambulation;RW;Minimal assistance(with and without RW) Toilet Transfer Details (indicate cue type and reason): Pt able to ambulate to sink with min guard assist using RW. She required intermittent min assist to ambulate from sink to bathroom for toilet transfers without RW.  Toileting- Clothing Manipulation and Hygiene: Sit to/from stand;Supervision/safety       Functional mobility during ADLs: Min guard;Minimal assistance;Rolling walker General ADL Comments: Pt able to complete multiple ADL tasks today. She reports feeling uneasy about ambulation and with some dizziness noted. SpO2 96% after activity but did note increased shortness of breath.      Vision Baseline Vision/History: Macular Degeneration;Wears glasses Wears Glasses: Reading only Patient Visual Report: No change from baseline Vision Assessment?: No apparent  visual deficits Additional Comments: No difficulty with functional use of vision noted during evaluation.      Perception     Praxis      Pertinent  Vitals/Pain Pain Assessment: No/denies pain     Hand Dominance Right   Extremity/Trunk Assessment Upper Extremity Assessment Upper Extremity Assessment: Generalized weakness;RUE deficits/detail RUE Deficits / Details: Decreased shoulder flexion strength as compared to L with 4-/5 noted as compared to 4/5 grossly on the L.    Lower Extremity Assessment Lower Extremity Assessment: Overall WFL for tasks assessed       Communication Communication Communication: No difficulties   Cognition Arousal/Alertness: Awake/alert Behavior During Therapy: WFL for tasks assessed/performed Overall Cognitive Status: Within Functional Limits for tasks assessed                                     General Comments       Exercises     Shoulder Instructions      Home Living Family/patient expects to be discharged to:: Private residence Living Arrangements: Children(daughter) Available Help at Discharge: Family;Available PRN/intermittently Type of Home: House Home Access: Stairs to enter [employer] of Steps: 2 Entrance Stairs-Rails: Right Home Layout: Two level Alternate Level Stairs-Number of Steps: 12   Bathroom Shower/Tub: Teacher, early years/pre: Standard     Home Equipment: Environmental consultant - 4 wheels;Bedside commode(may have 2 BSC)   Additional Comments: lives with her daughter who is a Catering manager; another daughter present during session      Prior Functioning/Environment Level of Independence: Independent        Comments: Enjoys going to the beach with her family         OT Problem List: Decreased strength;Decreased range of motion;Decreased activity tolerance;Impaired balance (sitting and/or standing);Decreased safety awareness;Decreased knowledge of use of DME or AE;Decreased knowledge of precautions;Pain      OT Treatment/Interventions: Self-care/ADL training;Therapeutic exercise;Energy conservation;DME and/or AE  instruction;Therapeutic activities;Patient/family education;Balance training    OT Goals(Current goals can be found in the care plan section) Acute Rehab OT Goals Patient Stated Goal: go home OT Goal Formulation: With patient Time For Goal Achievement: 09/13/17 Potential to Achieve Goals: Good ADL Goals Pt Will Perform Grooming: with modified independence;standing Pt Will Perform Lower Body Bathing: with modified independence;sit to/from stand Pt Will Perform Lower Body Dressing: with modified independence;sit to/from stand Pt Will Transfer to Toilet: with modified independence;ambulating;regular height toilet Pt Will Perform Toileting - Clothing Manipulation and hygiene: with modified independence;sit to/from stand Pt Will Perform Tub/Shower Transfer: with modified independence;Tub transfer;3 in 1;rolling walker  OT Frequency: Min 2X/week   Barriers to D/C:            Co-evaluation              AM-PAC PT "6 Clicks" Daily Activity     Outcome Measure Help from another person eating meals?: None Help from another person taking care of personal grooming?: None Help from another person toileting, which includes using toliet, bedpan, or urinal?: A Little Help from another person bathing (including washing, rinsing, drying)?: A Little Help from another person to put on and taking off regular upper body clothing?: A Little Help from another person to put on and taking off regular lower body clothing?: A Little 6 Click Score: 20   End of Session Equipment Utilized During Treatment: Gait belt;Rolling walker Nurse Communication: Mobility status  Activity Tolerance: Patient  tolerated treatment well Patient left: in chair;with call bell/phone within reach;with family/visitor present  OT Visit Diagnosis: Other abnormalities of gait and mobility (R26.89);Muscle weakness (generalized) (M62.81)                Time: 1329-1350 OT Time Calculation (min): 21 min Charges:  OT General  Charges $OT Visit: 1 Visit OT Evaluation $OT Eval Moderate Complexity: 1 Mod G-Codes:     Norman Herrlich, MS OTR/L  Pager: [place] [facility] [place] 08/30/2017, 3:12 PM

## 2017-08-30 NOTE — Progress Notes (Signed)
Family Medicine Teaching Service Daily Progress Note Intern Pager: [phone]  Patient name: [name] Medical record number: [id number] Date of birth: 22-Mar-1923 Age: 84 y.o. Gender: female  Primary Care Provider: McDiarmid, Blane Ohara, MD Consultants:  Code Status: full  Pt Overview and Major Events to Date:  7/3 admitted.  Assessment and Plan: [name] is a 84 y.o. female presenting with shortness of breath. PMH is significant for COPD, atrial fibrillation, HLD, GERD, CKD, and nephrectomy.  Shortness of breath: Likely 2/2 to COPD exacerbation, could also have a component of CHF, although unlikely. Satting in 90s on RA this morning. CXR with cardiomegaly and hyperinflation along with chronic interstitial opacities at the bases but no acute changes. BNP elevated to 319, although this is on the lower end of her previous BNP measurements, and her CKD contributes to this elevated level. Does not use O2 at home. Just finished a steroid taper. Takes duonebs and albuterol PRN at home but just got the equipment for duonebs and hasn't been using them the last few days.  Has stopped using Trelegy temporarily because an acute illness per pulmonology but the plan is to restart.  - Albuterol 25mL 2 puffs q4 hrs PRN Dyspnea - DuoNeb 0.5/2.5 nebulization q4 hours - Prednisone:50mg  PO qDaily - Cefdinir 300mg  PO qDaily (reduced dose d/t renal insufficiency) - discussion with pharmacy about outpatient regimen (likley restart trelogy w/ pulm at a later date) -ambulatory pulse ox  Atrial fibrillation, paroxysmal: has been in and out of Afib in the ED. Asymptomatic.Takes amiodarone 200 mg daily and Eliquis 2.5 mg daily rather than BID at home because she thinks it makes her bruise too much. Toprol XL 20mg  daily for rate control.  - Amiodarone 200mg  PO qDaily -re: amiodarone, outpatient PFT to check for pulmonary fibrosis - Metoprolol 20mg  PO qDaily - Eliquis 2.5mg  PO BID (she has doubts about continuing this  medicine but only wants to discuss with Dr. McDiarmid) - Aspirin 81mg  PO qDaily - HAS-BLED vs. CHADS-VASC: 4 (Major risk of bleed) Vs 5 (7.2% stroke risk per year). For outpatient discussion.  HFpEF: Grade 2 Diastolic Dysfxn on Echo in 2016, although EF is 55-60%. BNP 319.1 on admission, elevated but patient's baseline - hold lasix in setting of AKI   AKI on CKD Stage 4: nephrectomy 2/2 "transitional cell cancer", unsure which side. GFR 19, BUN 57 and Cr 2.18 on admission (7/2) Maintenance fluids 100 NS IV overnight - KVO fluids and encourage PO intake in light of increasing pitting edema  HLD: hx of low HDL, last labs 08/11/16 HDL 37.  - continue Lipitor 20mg  daily PO  GERD: chronic, stable - Pantoprazole 40mg  PO PRN  FEN/GI: Regular Prophylaxis: Eliquis  Disposition: Telemetry  Subjective:  Feeling about a 7/10 compared to her baseline and would like to be discharged so she can go to the beach this weekend, was taken of trelogy temporarily and prescrbied duonebs which she wasn't taking for the last few days because her meds just got delivered.  Objective: Temp:  [97.5 F (36.4 C)] 97.5 F (36.4 C) (07/03 0636) Pulse Rate:  [52-104] 100 (07/03 0636) Resp:  [16-28] 16 (07/03 0636) BP: (88-162)/(60-135) 89/60 (07/03 0636) SpO2:  [88 %-97 %] 94 % (07/03 0636) Weight:  [175 lb (79.4 kg)] 175 lb (79.4 kg) (07/02 2347) Physical Exam: Constitutional: She is oriented to person, place, and time. She appears well-developed and well-nourished. Very conversational Eyes: EOM are normal.  Neck: Normal range of motion Cardiovascular: Normal heart  sounds and intact distal pulses. Exam reveals no friction rub. No murmur heard. Irregular rate and rhythm but not tachy  Pulmonary/Chest: She exhibits no tenderness. diffuse wheezing that she says is normal for her, no cough during interview.  Mid 90s on room air Abdominal: Soft. Bowel sounds are normal. There is no tenderness.   Musculoskeletal: She exhibits no edema (bilateral lower extremities, 2+ pitting).  Neurological: She is alert and oriented to person, place, and time.  Skin: exposed Skin is warm and dry. Capillary refill takes less than 2 seconds. Psychiatric: She has a normal mood and affect.   Laboratory: Recent Labs  Lab 08/29/17 2355  WBC 9.2  HGB 12.2  HCT 40.1  PLT 227   Recent Labs  Lab 08/29/17 2355  NA 139  K 4.9  CL 104  CO2 27  BUN 57*  CREATININE 2.18*  CALCIUM 9.0  GLUCOSE 111*     Imaging/Diagnostic Tests: Dg Chest 2 View  Result Date: 08/30/2017 CLINICAL DATA:  Edema in the legs EXAM: CHEST - 2 VIEW COMPARISON:  08/24/2017, 08/16/2016 FINDINGS: Right-sided pacing device as before. Hyperinflation with chronic interstitial opacity. Platelike atelectasis at the right infrahilar lung. Stable cardiomegaly. Scarring at the bilateral lung bases. No pleural effusion. Aortic atherosclerosis. IMPRESSION: No active cardiopulmonary disease. Stable cardiomegaly and hyperinflation with chronic interstitial opacities at the bases. Electronically Signed   By: Donavan Foil M.D.   On: 08/30/2017 00:14     Sherene Sires, DO 08/30/2017, 9:00 AM PGY-1, [place] Intern pager: [phone], text pages welcome

## 2017-08-30 NOTE — Discharge Summary (Signed)
[facility] Discharge Summary  Patient name: [name] Medical record number: [id number] Date of birth: Jul 22, 1923 Age: 84 y.o. Gender: female Date of Admission: 08/29/2017  Date of Discharge: 08/30/17 Admitting Physician: Zenia Resides, MD  Primary Care Provider: McDiarmid, Blane Ohara, MD Consultants:   Indication for Hospitalization: COPD exacerbation  Discharge Diagnoses/Problem List:  COPD Afib (rate controlled) CKD4 Solitary kidney Chronic anticoagulation CHF G2DD GERD  Disposition: to home  Discharge Condition: stable  Discharge Exam:  Constitutional: She isoriented to person, place, and time. She appearswell-developedand well-nourished. Very conversational  Cardiovascular:Normal heart soundsand intact distal pulses. No murmurheard. Irregular rate and rhythm but not tachy Pulmonary/Chest: She exhibitsno tenderness.diffuse wheezing that she says is normal for her, no cough during interview.  Mid 90s on room air, passed ambulation test with PT Abdominal:Soft.Bowel sounds are normal. There isno tenderness.  Musculoskeletal: She exhibits noedema (bilateral lower extremities, 2+ pitting).  Neurological: She isalertand oriented to person, place, and time. Does miss details when asked specific questions but covers well by redirecting conversation Skin: exposed Skin iswarmand dry. App. 2" diameter bruise on medial ankle, no bony pain or ankle disfunction.  Capillary refill takesless than 2 seconds. Psychiatric: She has anormal mood and affect. but when pressed for details there appears to be cognitive decline consistent with dementia  Brief Hospital Course:  Presented with COPD exacerbation with brief desats in ED to mid 80s.   CXR concerning for hyperinflation consistent with COPD.   She had recently been seen by pulmonology and was finishing a steroid taper for bronchitis.  She was also taken off trellegy recently and prescribed  duonebs q6 out of concern she wasn't adequately delivering the medicine to her lungs (whether through technique or due to pathology).  Patient was under the impression that she would never be on trellegy again but that seems to be untrue, she never actually received the nebulizer at home until she was actually hospitalized so she went at least 3 days without any controller medication.  She was well appearing by morning and at no point appeared septic.  She also thought she was getting edema in her legs so she took a lasix which has caused AKI in the past (her creatinine was slightly elevated about 2 with a baseline ~1.7).  After receiving scheduled duonebs, prednisone 50 and starting cefdinir over night she quickly resolved to a respiratory status that she considered to be 8/10 compared to her baseline by the morning of 7/3.    She passed an ambulation test for O2 sats.  We discussed her case and status with her pcp who advised followup with him in his next clinic day with specific instructions to not take lasix without seeing a doctor in clinic.  She is further able to schedule a "same day" appt if she has any sense of being fluid overloaded or difficulty breathing.  We also discussed taking duonebs Q6 until she saw Dr. Wendy Poet and finishing a 4 day course of prednisone and cefdinir.  Issues for Follow Up:  1. Patient told to not take lasix without presenting to clinic for evaluation. 2. Patient scheduled for PFT with Dr. Valentina Lucks out of concern to screen to pulmonary fibrosis from amiodarone.  She has an early Aug. pulmonology appt as well. 3. She was prescribed cefdinir to finish a 5 day course 4. She was prescribed prednisone to finish a 5 day course. 5. We discussed attempting to replace some of her sweet tea intake with water.  6. She was prescribed home health PT to assist her with more steady ambulation, she denied any recent falls but did have a cut on back of her leg but said she didn't remember  the cause. 7. She has been on eliquis long term but states she doesn't like the medication because she thinks she bruises too easily (she did have a bruise on left ankle).  We offered to have longer conversation about pros/cons of this but she said she would rather discuss with Dr. McDiarmid and do "whatever he wants me to do". 8. *Please order home health OT for patient, she was approved for it during hospitalization but note was too late to be included in discharge orders.  Significant Procedures:   Significant Labs and Imaging:  Recent Labs  Lab 08/29/17 2355  WBC 9.2  HGB 12.2  HCT 40.1  PLT 227   Recent Labs  Lab 08/29/17 2355  NA 139  K 4.9  CL 104  CO2 27  GLUCOSE 111*  BUN 57*  CREATININE 2.18*  CALCIUM 9.0    Dg Chest 2 View  Result Date: 08/30/2017 CLINICAL DATA:  Edema in the legs EXAM: CHEST - 2 VIEW COMPARISON:  08/24/2017, 08/16/2016 FINDINGS: Right-sided pacing device as before. Hyperinflation with chronic interstitial opacity. Platelike atelectasis at the right infrahilar lung. Stable cardiomegaly. Scarring at the bilateral lung bases. No pleural effusion. Aortic atherosclerosis. IMPRESSION: No active cardiopulmonary disease. Stable cardiomegaly and hyperinflation with chronic interstitial opacities at the bases. Electronically Signed   By: Donavan Foil M.D.   On: 08/30/2017 00:14    Results/Tests Pending at Time of Discharge:   Discharge Medications:  Allergies as of 08/30/2017      Reactions   Baclofen Other (See Comments)   Confusion occurred with taking 3 at the same time.   Codeine Phosphate Nausea Only   Norco [hydrocodone-acetaminophen] Nausea And Vomiting   Hydrochlorothiazide Other (See Comments)   Possible acute gout or arthritis of wrist   Montelukast Sodium Other (See Comments)   Unspecified reaction.       Medication List    STOP taking these medications   furosemide 40 MG tablet Commonly known as:  LASIX   TRELEGY ELLIPTA 100-62.5-25  MCG/INH Aepb Generic drug:  Fluticasone-Umeclidin-Vilant     TAKE these medications   albuterol 108 (90 Base) MCG/ACT inhaler Commonly known as:  VENTOLIN HFA For 5 days, then every 6 hours as needed for cough or shortness of breath or wheezing.   amiodarone 200 MG tablet Commonly known as:  PACERONE TAKE 1 TABLET(200 MG) BY MOUTH DAILY   aspirin 81 MG chewable tablet Chew 81 mg by mouth daily.   atorvastatin 20 MG tablet Commonly known as:  LIPITOR TAKE 1 TABLET BY MOUTH DAILY   calcium carbonate 1500 (600 Ca) MG Tabs tablet Commonly known as:  OSCAL Take 600 mg of elemental calcium by mouth 2 (two) times daily with a meal.   cefdinir 300 MG capsule Commonly known as:  OMNICEF Take 1 capsule (300 mg total) by mouth daily. Start taking on:  08/31/2017   Cholecalciferol 1000 units tablet Take 1 tablet by mouth daily.   ELIQUIS 2.5 MG Tabs tablet Generic drug:  apixaban TAKE 1 TABLET BY MOUTH TWICE DAILY   FIBER DIET Tabs Take 2 tablets by mouth daily.   fluticasone 50 MCG/ACT nasal spray Commonly known as:  FLONASE SHAKE LIQUID AND USE 2 SPRAYS IN EACH NOSTRIL DAILY   ipratropium-albuterol 0.5-2.5 (3) MG/3ML Soln Commonly  known as:  DUONEB USE 3 ML VIA NEBULIZER EVERY 4 HOURS   metoprolol succinate 25 MG 24 hr tablet Commonly known as:  TOPROL-XL TAKE 1 TABLET(25 MG) BY MOUTH DAILY   nitroGLYCERIN 0.4 MG SL tablet Commonly known as:  NITROSTAT Place 1 tablet (0.4 mg total) under the tongue every 5 (five) minutes as needed for chest pain (x 3 tabs).   omeprazole 20 MG capsule Commonly known as:  PRILOSEC TAKE 1 CAPSULE(20 MG) BY MOUTH DAILY   predniSONE 50 MG tablet Commonly known as:  DELTASONE Take 1 tablet (50 mg total) by mouth daily. What changed:    medication strength  See the new instructions.   SENNOSIDES PO Take 2 tablets by mouth daily.   traMADol 50 MG tablet Commonly known as:  ULTRAM TAKE 1 TABLET BY MOUTH EVERY 6 HOURS AS NEEDED FOR  PAIN   vitamin B-12 1000 MCG tablet Commonly known as:  CYANOCOBALAMIN Take 1,000 mcg by mouth daily.       Discharge Instructions: Please refer to Patient Instructions section of EMR for full details.  Patient was counseled important signs and symptoms that should prompt return to medical care, changes in medications, dietary instructions, activity restrictions, and follow up appointments.   Follow-Up Appointments: Follow-up Information    McDiarmid, Blane Ohara, MD. Go on 09/07/2017.   Specialty:  Family Medicine Why:  9:30am Contact information: [place] [address] [address]. Go on 09/14/2017.   Specialty:  Family Medicine Why:  8:30am w/ Dr. Valentina Lucks for lung test (pft) Contact information: [address] [id number] [place] [address] [place], [place], DO 08/30/2017, 3:42 PM PGY-2, [place]

## 2017-08-30 NOTE — H&P (Addendum)
[facility] Admission History and Physical Service Pager: [phone]  Patient name: [name] Medical record number: [id number] Date of birth: 09-Apr-1923 Age: 84 y.o. Gender: female  Primary Care Provider: McDiarmid, Blane Ohara, MD Consultants: none  Code Status: Full  Chief Complaint: shortness of breath  Assessment and Plan: [name] is a 84 y.o. female presenting with shortness of breath. PMH is significant for COPD, atrial fibrillation, HLD, GERD, CKD, and nephrectomy.  Shortness of breath: Likely 2/2 to COPD exacerbation, could also have a component of CHF, although unlikely. Lowest oxygen saturation was 88% on RA in the ED. CXR with cardiomegaly and hyperinflation along with chronic interstitial opacities at the bases but no acute changes. BNP elevated to 319, although this is on the lower end of her previous BNP measurements, and her CKD contributes to this elevated level. Does not use O2 at home. Just finished a steroid taper. Takes duonebs and albuterol PRN at home.  Has stopped using Trelegy because it is no longer effective for her. - Admit to telemetry, attending [place] - Albuterol 57mL 2 puffs q4 hrs PRN Dyspnea - DuoNeb 0.5/2.5 nebulization q4 hours - Prednisone:50mg  PO qDaily - Cefdinir 300mg  PO qDaily (reduced dose d/t renal insufficiency) - discussion with pharmacy about outpatient regimen  Atrial fibrillation, paroxysmal: has been in and out of Afib in the ED. Asymptomatic.Takes amiodarone 200 mg daily and Eliquis 2.5 mg daily rather than BID at home because she thinks it makes her bruise too much. Toprol XL 20mg  daily for rate control.  - Amiodarone 200mg  PO qDaily - Metoprolol 20mg  PO qDaily - Eliquis 2.5mg  PO BID - Aspirin 81mg  PO qDaily - HAS-BLED vs. CHADS-VASC: 4 (Major risk of bleed) Vs 5 (7.2% stroke risk per year). For outpatient discussion.  HFpEF: Grade 2 Diastolic Dysfxn on Echo in 2016, although EF is 55-60%. BNP 319.1 on  admission, elevated but patient's baseline - hold lasix in setting of AKI   AKI on CKD Stage 4: nephrectomy 2/2 "transitional cell cancer", unsure which side. GFR 19, BUN 57 and Cr 2.18 on admission (7/2) - Maintenance fluids 100 NS IV  HLD: hx of low HDL, last labs 08/11/16 HDL 37.  - continue Lipitor 20mg  daily PO  GERD: chronic, stable - Pantoprazole 40mg  PO PRN  FEN/GI: Regular Prophylaxis: Eliquis  Disposition: Telemetry  History of Present Illness:  [name] is a 84 y.o. female presenting with shortness of breath that started about two days ago.  Her shortness of breath was accompanied by a cough that was minimally productive.  She was particularly worried by the swelling in her legs.  She took one tablet of Lasix on Monday.  She seldom takes her Lasix due to CKD but did take a tablet on Monday.  She scraped the back of her right leg on Monday, and it was oozing "water."  She kept wrapping this area, and it continued to be soaked by a watery substance.  Has not been drinking much water lately and says she never drinks much water.  She has also had increased bruising over the past few days and reports starting Eliquis 2.5 BID for Afib in March.  She reports having traveled to [place] for a cruise in May 2019. She reports having a quick flight to [place] and denies long car rides.  Review Of Systems: Per HPI with the following additions:  Review of Systems  Constitutional: Negative for chills and fever.  HENT: Positive for hearing loss (  Presbycussis).   Eyes: Negative.   Respiratory: Positive for cough and shortness of breath. Negative for sputum production.   Cardiovascular: Positive for leg swelling. Negative for chest pain and palpitations.  Gastrointestinal: Negative for abdominal pain, diarrhea, nausea and vomiting.  Genitourinary: Positive for frequency. Negative for dysuria and urgency.  Musculoskeletal: Negative for falls.  Skin:       Bruising since 7/1, new   Neurological: Positive for dizziness. Negative for tremors, speech change and headaches.  Endo/Heme/Allergies: Negative for polydipsia. Bruises/bleeds easily.  Psychiatric/Behavioral: Positive for memory loss.    Patient Active Problem List   Diagnosis Date Noted  . Other fatigue 06/30/2017  . Bilateral leg edema 01/10/2017  . Nocturia 10/28/2016  . Fall 10/27/2016  . Abnormality of gait 07/28/2016  . Chronic diastolic CHF (congestive heart failure) ([place]) 11/08/2015  . GERD (gastroesophageal reflux disease) 11/08/2015  . Hearing loss 10/16/2015  . On amiodarone therapy 09/14/2015  . Hypotension 07/09/2015  . Paroxysmal atrial fibrillation (HCC)   . Pure hypercholesterolemia 07/31/2014  . CAD S/P RCA BMS, residual CFX disease 06/08/2014  . Pacemaker 04/02/2013  . Mild cognitive impairment 10/26/2012  . Chronic anticoagulation 10/01/2012  . Urge incontinence 12/13/2011  . Vitamin D deficiency 11/02/2010  . Macular degeneration, bilateral 10/04/2010    Class: Chronic  . Iron deficiency anemia due to chronic blood loss 08/05/2010  . Solitary kidney, acquired 05/17/2010  . Spondylolisthesis of lumbar region   . VITAMIN B12 DEFICIENCY 10/07/2009  . Chronic kidney disease (CKD), stage IV (severe) ([place]) 09/02/2009  . MYOCARDIAL INFARCTION, HX OF 09/25/2008  . VENTRICULAR HYPERTROPHY, LEFT 08/28/2008  . At risk for diabetes mellitus 09/11/2007  . Anxiety disorder due to general medical condition with panic attack 04/27/2006  . SICK SINUS SYNDROME 04/27/2006  . Allergic rhinitis, seasonal 04/27/2006  . COPD, severe ([place]) 04/27/2006  . GASTROESOPHAGEAL REFLUX, NO ESOPHAGITIS 04/27/2006  . Osteoarthrosis involving lower leg 04/27/2006  . LUMBAR SPINAL STENOSIS 04/27/2006    Past Medical History: Past Medical History:  Diagnosis Date  . Abnormal mammogram, unspecified 08/23/2010   Followup imaging reassuring.   . Acute appendicitis with rupture   . ADENOMATOUS COLONIC POLYP  03/01/2003   Qualifier: Diagnosis of  By: McDiarmid MD, Sherren Mocha Multiple benign polyps of cecum, ascending, transverse and sigmoid colon by 8/09 colonoscopy by Dr Cristina Gong  Colonoscopy by Dr Cristina Gong for iron-deficiency anemia on 04/27/2010 showed three sessile polyps that were in ascending (3 mm x 9 mm), transverse (4 mm), and cecum (3 mm).  All three were tubular adenomas that were negative for high grade dysplasia or malignancy on pathology. Dr Cristina Gong called the polpys benign and not requiring follow-up in view of the patients age.    . Adrenal adenoma    Incidentaloma  . AF (paroxysmal atrial fibrillation) ([place]) 11/11/2010   Hospitalization (9/8-9/10, Dr Daneen Schick, III, Cardiology) for Paroxysmal Atrial Fibrillation with RVR and anginal pain secondary to demand/supply mismatch in setting of RVR with known circumflex artery branch disease.    Marland Kitchen ANXIETY 04/27/2006   Qualifier: Diagnosis of  By: McDiarmid MD, Sherren Mocha    . Benign essential tremor 02/04/2016  . Candidiasis of the esophagus 11/26/2007  . CAP (community acquired pneumonia) 02/25/2015  . Cataract 2013   Bilateral   . Cellulitis of leg, right 10/01/2012  . Cellulitis of right lower extremity   . Chest pain with moderate risk of acute coronary syndrome 05/21/2015  . Chronic kidney disease (CKD), stage III (moderate) (HCC)   . Concussion  with loss of consciousness   . COPD 04/27/2006   Qualifier: Diagnosis of  By: McDiarmid MD, Sherren Mocha    . COPD exacerbation ([place])   . COPD, severe   . Decreased functional mobility and endurance 03/17/2015  . Diastolic heart failure ([place]) 07/30/2015  . DISC WITH RADICULOPATHY 04/27/2006   Qualifier: Diagnosis of  By: McDiarmid MD, Sherren Mocha    . EDEMA-LEGS,DUE TO VENOUS OBSTRUCT. 04/27/2006   Qualifier: Diagnosis of  By: McDiarmid MD, Sherren Mocha    . 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 Normal uric acid serum level at time of attack.    Marland Kitchen HERNIA,  HIATAL, NONCONGENITAL 04/27/2006   Qualifier: Diagnosis of  By: McDiarmid MD, Sherren Mocha    . High risk medications (not anticoagulants) long-term use 03/05/2012  . History of Hemorrhoids 04/27/2006   Qualifier: Diagnosis of  By: McDiarmid MD, Sherren Mocha    . History of iron deficiency 01/16/2015  . Hx of colonoscopy with polypectomy 04/27/2010   Dr Cristina Gong found three  tubular adenomas each less than 10 mm size  . Hypertension   . Hypotension 07/09/2015  . Iron deficiency anemia 08/05/2010   Dr Cristina Gong (GI) has evaluated with EGD, colonoscopy, and video capsular endoscopy in 2011 & 2012.  All have been unrevealing as to an origin of IDA.  OV with Dr Cristina Gong (10/28/10) assessment of blood in stool per hemoccult and GER. Hbg 12.1 g/dL, MCV 91.8, Ferritin 30 ng/mL. Patient taking on ferrous sulfate tab daily.   EGD on 03/06/12 by Dr Cristina Gong for IDA non-obstructing Schatzki's ring at Gastroesophageal junction, otherwise normal esophagus and stomach.     . Leg cramps 05/29/2012  . Lumbar herniated disc    History of HNP L4/5 in 2003  . Macular degeneration, bilateral 10/04/2010   Right eye is wet MD, the other is dry macular degeneration (ARMD). Pt undergoing some form of vascular endothelial growth factor inhibition intraocular therapy.    . Mild cognitive impairment 10/26/2012   (10/25/12) Failed MiniCog screen  . MUSCLE CRAMPS 03/11/2010   Qualifier: Diagnosis of  By: McDiarmid MD, Sherren Mocha    . Muscle spasm of back 09/24/2013  . Myocardial infarct, old   . Nocturia 10/28/2016  . Numbness and tingling in hands 07/21/2011  . Pacemaker    MDT  . PREDIABETES 09/11/2007   Qualifier: Diagnosis of  By: McDiarmid MD, Sherren Mocha    . Retinal hemorrhage of left eye 06/2010  . RHINITIS, ALLERGIC 04/27/2006   Qualifier: Diagnosis of  By: McDiarmid MD, Sherren Mocha    . SCHATZKI'S RING, HX OF 11/26/2007   Qualifier: Diagnosis of  By: McDiarmid MD, Sherren Mocha  An EGD was performed by Dr Cristina Gong on 04/27/2010 for iron deficiency anemia. There was a a  transient hiatal hernia with Schatzki's ring. Stomach and duodenum were normal. EGD on 03/06/12 by Dr Cristina Gong for IDA non-obstructing Schatzki's ring at Gastroesophageal junction, otherwise normal esophagus and stomach.    . Seborrheic keratosis, right anterior thigh 12/12/2013  . Shoulder pain, left 01/09/2014  . Sick sinus syndrome with tachycardia ([place])    MDT  . Soft tissue injury of foot 05/03/2011  . Solar lentigo 06/15/2012  . Solitary kidney, acquired 05/17/2010   Surgical removal for transitional cell cancer by Tresa Endo, MD ([place]). Surveillance cystoscopy by Dr Alinda Money [facility]) on 10/19/12 without evidence of cystoscopic recurrence. Recommend RTC one year for cystoscopy.   Marland Kitchen Spinal stenosis, lumbar   .  Transitional cell carcinoma of ureter, history   . Urge incontinence 12/13/2011   Diagnosed in 10/2011 by Dr Bjorn Loser (Urology)   . VENTRICULAR HYPERTROPHY, LEFT 08/28/2008   Qualifier: Diagnosis of  By: McDiarmid MD, Sherren Mocha    . VITAMIN B12 DEFICIENCY 10/07/2009   Qualifier: Diagnosis of  By: McDiarmid MD, Sherren Mocha  Dx based on a post-TKR anemia work-up Low normal serum B12 with high Methylmalonic acid and homocysteine level  Vit B12 serum level (10/28/10) > 1500 pg/mL   . Vitamin D deficiency 11/02/2010   Serum vitamin D 25(OH) = 10.9 ng/mL (30 -100) on 10/28/10 c/w Vitamin D deficiency.       Past Surgical History: Past Surgical History:  Procedure Laterality Date  . BREAST SURGERY     breast reduction  . CHOLECYSTECTOMY    . CORONARY ANGIOPLASTY WITH STENT PLACEMENT  2010   BMS RCA, OM2 occluded  . CYSTOSCOPY  09/30/2015   No cystoscopic evidence of uroepithelial neoplasm.  Follow up surveillance cystoscopy in one year  . DUPUYTREN CONTRACTURE RELEASE Right 05/22/2014   Procedure: DUPUYTREN RELEASE AND REPAIR AS NECESSARY RIGHT RING FINGER AND MIDDLE FINGER;  Surgeon: Roseanne Kaufman, MD;  Location: [facility];  Service: Orthopedics;  Laterality: Right;  .  ESOPHAGOGASTRODUODENOSCOPY  04/27/2010   Dr Cristina Gong - found transient H/H & Schatzki's ring  . ESOPHAGOGASTRODUODENOSCOPY ENDOSCOPY  03/06/2012   Dr Cristina Gong - found non-obstuctive Schatzki's ring at [employer] o/w normal EGD.   Marland Kitchen EYE SURGERY     bilateral cataracts  . KNEE ARTHROSCOPY W/ SYNOVECTOMY  11/2009, left knee   Dr Wynelle Link  . NEPHRECTOMY  For transition cell cancer    Dr Tresa Endo, surgeon  . PACEMAKER GENERATOR CHANGE N/A 11/12/2012   Procedure: PACEMAKER GENERATOR CHANGE;  Surgeon: Evans Lance, MD; [employer] [facility] dual-chamber pacemaker serial number [id number]; Laterality: Right  . PACEMAKER INSERTION  2005   Dr Doreatha Lew  . PACEMAKER LEAD REMOVAL  2005   Removal and reinsertion of atrial and ventricular leads due to migration  . REPLACEMENT TOTAL KNEE  2009, Right knee   Dr Wynelle Link  . REPLACEMENT TOTAL KNEE  05/2009, Left knee   Dr Wynelle Link  . TONSILLECTOMY    . TRIGGER FINGER RELEASE Right 05/22/2014   Procedure: RIGHT HAND A-1 PULLEY RELEASE ;  Surgeon: Roseanne Kaufman, MD;  Location: [facility];  Service: Orthopedics;  Laterality: Right;    Social History: Social History   Tobacco Use  . Smoking status: Former Smoker    Packs/day: 1.00    Types: Cigarettes    Last attempt to quit: 03/02/2009    Years since quitting: 8.5  . Smokeless tobacco: Never Used  Substance Use Topics  . Alcohol use: Yes    Alcohol/week: 7.8 oz    Types: 6 Standard drinks or equivalent, 7 Glasses of wine per week    Comment: 5 oz white wine per day  . Drug use: No   Additional social history: ~30+ PPD smoke history Please also refer to relevant sections of EMR.  Family History: Family History  Problem Relation Age of Onset  . Dementia Mother   . Heart disease Father   . Heart attack Father     Allergies and Medications: Allergies  Allergen Reactions  . Baclofen Other (See Comments)    Confusion occurred with taking 3 at the same time.  . Codeine Phosphate Nausea Only  . Norco  [Hydrocodone-Acetaminophen] Nausea And Vomiting  . Hydrochlorothiazide Other (See Comments)  Possible acute gout or arthritis of wrist  . Montelukast Sodium Other (See Comments)    Unspecified reaction.    No current facility-administered medications on file prior to encounter.    Current Outpatient Medications on File Prior to Encounter  Medication Sig Dispense Refill  . albuterol (VENTOLIN HFA) 108 (90 Base) MCG/ACT inhaler For 5 days, then every 6 hours as needed for cough or shortness of breath or wheezing. 18 g 5  . amiodarone (PACERONE) 200 MG tablet TAKE 1 TABLET(200 MG) BY MOUTH DAILY 90 tablet 0  . aspirin 81 MG chewable tablet Chew 81 mg by mouth daily.    Marland Kitchen atorvastatin (LIPITOR) 20 MG tablet TAKE 1 TABLET BY MOUTH DAILY 90 tablet 3  . calcium carbonate (OSCAL) 1500 (600 Ca) MG TABS tablet Take 600 mg of elemental calcium by mouth 2 (two) times daily with a meal.    . Cholecalciferol 1000 units tablet Take 1 tablet by mouth daily.     Marland Kitchen ELIQUIS 2.5 MG TABS tablet TAKE 1 TABLET BY MOUTH TWICE DAILY 60 tablet 5  . FIBER DIET TABS Take 2 tablets by mouth daily.    . fluticasone (FLONASE) 50 MCG/ACT nasal spray SHAKE LIQUID AND USE 2 SPRAYS IN EACH NOSTRIL DAILY 48 g 3  . furosemide (LASIX) 40 MG tablet TAKE 1/2 TABLET(20 MG) BY MOUTH DAILY AS NEEDED FOR FLUID RETENTION OR SWELLING 30 tablet 0  . ipratropium-albuterol (DUONEB) 0.5-2.5 (3) MG/3ML SOLN USE 3 ML VIA NEBULIZER EVERY 4 HOURS 540 mL 0  . metoprolol succinate (TOPROL-XL) 25 MG 24 hr tablet TAKE 1 TABLET(25 MG) BY MOUTH DAILY 90 tablet 3  . nitroGLYCERIN (NITROSTAT) 0.4 MG SL tablet Place 1 tablet (0.4 mg total) under the tongue every 5 (five) minutes as needed for chest pain (x 3 tabs). 25 tablet PRN  . omeprazole (PRILOSEC) 20 MG capsule TAKE 1 CAPSULE(20 MG) BY MOUTH DAILY 90 capsule 3  . predniSONE (DELTASONE) 20 MG tablet TAKE 2 TABLETS(40 MG) BY MOUTH DAILY WITH BREAKFAST 10 tablet 0  . SENNOSIDES PO Take 2 tablets by  mouth daily.    . traMADol (ULTRAM) 50 MG tablet TAKE 1 TABLET BY MOUTH EVERY 6 HOURS AS NEEDED FOR PAIN 30 tablet 5  . TRELEGY ELLIPTA 100-62.5-25 MCG/INH AEPB INHALE 1 PUFF BY MOUTH ONCE DAILY (Patient not taking: Reported on 08/28/2017) 1 each 11  . vitamin B-12 (CYANOCOBALAMIN) 1000 MCG tablet Take 1,000 mcg by mouth daily.      Objective: BP (!) 89/60 (BP Location: Left Arm)   Pulse 100   Temp (!) 97.5 F (36.4 C) (Oral)   Resp 16   Ht 5\' 5"  (1.651 m)   Wt 175 lb (79.4 kg)   SpO2 94%   BMI 29.12 kg/m  Physical Exam  Constitutional: She is oriented to person, place, and time. She appears well-developed and well-nourished.  HENT:  Head: Normocephalic and atraumatic.  Eyes: EOM are normal.  Neck: Normal range of motion. No JVD present.  Cardiovascular: Normal heart sounds and intact distal pulses. Exam reveals no friction rub.  No murmur heard. Irregular rate and rhythm  Pulmonary/Chest: She exhibits no tenderness.  On face mask with inhaled albuterol, 4 L O2; diffuse rhonchi to auscultation  Abdominal: Soft. Bowel sounds are normal. There is no tenderness.  Musculoskeletal: She exhibits no edema (bilateral lower extremities, trace pitting).  Neurological: She is alert and oriented to person, place, and time.  Skin: Skin is warm and dry. Capillary refill takes  less than 2 seconds.  2cm superficial laceration to posterior right lower leg, secreting serosanguinous fluid, clean, covered with bandage  Psychiatric: She has a normal mood and affect.    Labs and Imaging: CBC BMET  Recent Labs  Lab 08/29/17 2355  WBC 9.2  HGB 12.2  HCT 40.1  PLT 227   Recent Labs  Lab 08/29/17 2355  NA 139  K 4.9  CL 104  CO2 27  BUN 57*  CREATININE 2.18*  GLUCOSE 111*  CALCIUM 9.0      Milus Banister, DO [place], PGY-1 08/30/2017 6:40 AM   FPTS Upper-Level Resident Addendum   I have independently interviewed and examined the patient. I have discussed the  above with the original author and agree with their documentation. My edits for correction/addition/clarification are in blue. Please see also any attending notes.    Kathrene Alu, MD PGY-2, [facility] 08/30/2017 6:59 AM  FPTS Service pager: [phone] (text pages welcome through [facility])

## 2017-09-01 ENCOUNTER — Other Ambulatory Visit: Payer: Self-pay | Admitting: Family Medicine

## 2017-09-01 DIAGNOSIS — Z7409 Other reduced mobility: Secondary | ICD-10-CM

## 2017-09-01 DIAGNOSIS — Z789 Other specified health status: Secondary | ICD-10-CM

## 2017-09-01 DIAGNOSIS — R269 Unspecified abnormalities of gait and mobility: Secondary | ICD-10-CM

## 2017-09-01 HISTORY — DX: Other reduced mobility: Z74.09

## 2017-09-01 HISTORY — DX: Other specified health status: Z78.9

## 2017-09-01 NOTE — Progress Notes (Unsigned)
Sent Order for [place] OT per 08/30/17 hospital OT evaluation.

## 2017-09-06 ENCOUNTER — Telehealth: Payer: Self-pay | Admitting: *Deleted

## 2017-09-06 NOTE — Telephone Encounter (Signed)
[name] called from [facility].  He called pt to get set up for home health PT but [place] the beach.  She will probably be home by Monday, so he will try again. [name], [name], CMA

## 2017-09-07 ENCOUNTER — Inpatient Hospital Stay: Payer: Medicare Other | Admitting: Family Medicine

## 2017-09-07 DIAGNOSIS — I4891 Unspecified atrial fibrillation: Secondary | ICD-10-CM | POA: Diagnosis not present

## 2017-09-07 DIAGNOSIS — J441 Chronic obstructive pulmonary disease with (acute) exacerbation: Secondary | ICD-10-CM | POA: Diagnosis not present

## 2017-09-07 DIAGNOSIS — R600 Localized edema: Secondary | ICD-10-CM | POA: Diagnosis not present

## 2017-09-07 DIAGNOSIS — Z950 Presence of cardiac pacemaker: Secondary | ICD-10-CM | POA: Diagnosis not present

## 2017-09-07 DIAGNOSIS — Z87891 Personal history of nicotine dependence: Secondary | ICD-10-CM | POA: Diagnosis not present

## 2017-09-07 DIAGNOSIS — R0602 Shortness of breath: Secondary | ICD-10-CM | POA: Diagnosis not present

## 2017-09-07 DIAGNOSIS — L03115 Cellulitis of right lower limb: Secondary | ICD-10-CM | POA: Diagnosis not present

## 2017-09-07 DIAGNOSIS — J9610 Chronic respiratory failure, unspecified whether with hypoxia or hypercapnia: Secondary | ICD-10-CM | POA: Diagnosis not present

## 2017-09-13 DIAGNOSIS — J441 Chronic obstructive pulmonary disease with (acute) exacerbation: Secondary | ICD-10-CM | POA: Diagnosis not present

## 2017-09-13 DIAGNOSIS — I2510 Atherosclerotic heart disease of native coronary artery without angina pectoris: Secondary | ICD-10-CM | POA: Diagnosis not present

## 2017-09-13 DIAGNOSIS — I130 Hypertensive heart and chronic kidney disease with heart failure and stage 1 through stage 4 chronic kidney disease, or unspecified chronic kidney disease: Secondary | ICD-10-CM | POA: Diagnosis not present

## 2017-09-13 DIAGNOSIS — I482 Chronic atrial fibrillation: Secondary | ICD-10-CM | POA: Diagnosis not present

## 2017-09-13 DIAGNOSIS — I5032 Chronic diastolic (congestive) heart failure: Secondary | ICD-10-CM | POA: Diagnosis not present

## 2017-09-13 DIAGNOSIS — H353221 Exudative age-related macular degeneration, left eye, with active choroidal neovascularization: Secondary | ICD-10-CM | POA: Diagnosis not present

## 2017-09-13 DIAGNOSIS — N184 Chronic kidney disease, stage 4 (severe): Secondary | ICD-10-CM | POA: Diagnosis not present

## 2017-09-13 DIAGNOSIS — H353211 Exudative age-related macular degeneration, right eye, with active choroidal neovascularization: Secondary | ICD-10-CM | POA: Diagnosis not present

## 2017-09-14 ENCOUNTER — Encounter: Payer: Self-pay | Admitting: Pharmacist

## 2017-09-14 ENCOUNTER — Telehealth: Payer: Self-pay

## 2017-09-14 ENCOUNTER — Ambulatory Visit (INDEPENDENT_AMBULATORY_CARE_PROVIDER_SITE_OTHER): Payer: Medicare Other | Admitting: Pharmacist

## 2017-09-14 DIAGNOSIS — K219 Gastro-esophageal reflux disease without esophagitis: Secondary | ICD-10-CM | POA: Diagnosis not present

## 2017-09-14 DIAGNOSIS — J449 Chronic obstructive pulmonary disease, unspecified: Secondary | ICD-10-CM | POA: Diagnosis not present

## 2017-09-14 DIAGNOSIS — I480 Paroxysmal atrial fibrillation: Secondary | ICD-10-CM

## 2017-09-14 MED ORDER — VITAMIN B-12 1000 MCG PO TABS: 1000.0000 ug | ORAL_TABLET | Freq: Every day | ORAL | 3 refills | Status: DC

## 2017-09-14 MED ORDER — BUDESONIDE 0.5 MG/2ML IN SUSP: 0.5000 mg | Freq: Two times a day (BID) | RESPIRATORY_TRACT | 12 refills | Status: DC

## 2017-09-14 MED ORDER — OMEPRAZOLE 20 MG PO CPDR: 20.0000 mg | DELAYED_RELEASE_CAPSULE | Freq: Every day | ORAL | 3 refills | Status: DC

## 2017-09-14 NOTE — Progress Notes (Signed)
   S:    Patient arrives in good spirits, but with increased complaints of dyspnea. Reports severe shortness of breath after walking from the handicap parking space to the front desk, and requires a wheelchair in the office. Presents for lung function evaluation. Patient was referred by Dr. Sherene Sires (attending Dr. Andria Frames) after recent discharge for COPD exacerbations for pulmonary function testing to screen for pulmonary fibrosis from amiodarone therapy. (referred on 08/30/2017). Patient was last seen by Primary Care Provider on 06/08/2017.  Patient reports breathing has been "much worse" over the past few weeks. Seen by pulmonologist Dr. Baltazar Apo on 08/23/2017 where there was concern she was not receiving full benefit from Trelegy due to reduced inspiratory capacity; Trelegy was temporarily discontinued and Duonebs (nebulized ipratropium/albuterol) was prescribed to be used every 4 hours. Patient also reports ED admission to [facility] on 7/11 for COPD exacerbation. Chart review also indicates that patient received cephalexin for cellulitis. She notes that she briefly used Duonebs four times daily, but was instructed to decrease to three times daily at ED discharge. She indicates that she believes her breathing has gotten worse since discontinuing Trelegy  Patient denies adherence to medications.  Patient reports last dose of COPD medications was this morning (Duoneb) Current COPD medications: Duoneb (ipratropium/albuterol) PRN Rescue inhaler use frequency: Not specified, has been using nebulizer more frequently Patient exacerbation hx: 1 hospital admission for COPD exacerbation, 1 ED visit for COPD exacerbation  Patient was recently discharged from hospital and all medications have been reviewed.  O: Physical Exam  Pulmonary/Chest: She has wheezes (bilaterally and throughout ).  Musculoskeletal: She exhibits edema (1-2+ pitting edema bilaterally, stable).     Review  of Systems  Respiratory: Positive for wheezing.   All other systems reviewed and are negative.   Vitals:   09/14/17 0907  BP: (!) 110/54  Pulse: (!) 112  SpO2: 94%    PFT results not checked today due to patient's poor breathing status. mMRC or CAT assessment not performed.   A/P: Patient has been experiencing significant dyspnea on nebulized ipratropium/albuterol. Wheezing on exam confirmed by Dr. Mingo Amber.  -continue ipratropium/albuterol nebulized four times daily and begin prednisone 40 mg x2 days and Pulmicort (budesonide).  Patient to take 40mg  prednisone tomorrow AM as well (provided medication today).  -Educated patient on purpose, proper use, potential adverse effects including risk of esophageal candidiasis and need to rinse mouth after each use.  Pt verbalized understanding of treatment plan. Note:  Consider Amiodarone - pulmonary fibrosis with lung evaluation when lung function is improved and stable.    Refilled Omeprazole and B-12   Written pt instructions provided.  F/U Clinic visit tomorrow with physician for assessment..    Total time in face to face counseling 45 minutes.  Patient seen with Catie Darnelle Maffucci, PharmD,  PGY2 Pharmacy Resident.

## 2017-09-14 NOTE — Telephone Encounter (Signed)
Patient called and has a couple questions about her medications from her OV with Dr Valentina Lucks this morning. She requests to only speak with Dr. Valentina Lucks.  Call back is [phone].  Danley Danker, RN [facility] RN)

## 2017-09-14 NOTE — Telephone Encounter (Signed)
After talking with patient, clarified name and type of medication for pick up.  I clarified name and pick up status after calling her pharmacy to ensure availability.  Clarified dosing of both pulmicort and ensured dose of prednisone for tomorrow.

## 2017-09-14 NOTE — Patient Instructions (Addendum)
Great to see you again today.   We started a new inhaled medication PULMICORT RESPULE with your nebulizer.  Use this medication twice daily. Start that today.  Take your PREDNISONE 2 tablets tomorrow morning.  We provided two of those for you today in office.   Take your DUONEB inhaler 4 times per day until your breathing is improved.   We have refilled your omeprazole and your Vitamin B12.   Follow-up tomorrow AFTERNOON for reevaluation here at the [facility].

## 2017-09-14 NOTE — Assessment & Plan Note (Signed)
Patient has been experiencing significant dyspnea on nebulized ipratropium/albuterol. Wheezing on exam confirmed by Dr. Mingo Amber.  -continue ipratropium/albuterol nebulized four times daily and begin prednisone 40 mg x2 days and Pulmicort (budesonide).  Patient to take 40mg  prednisone tomorrow AM as well (provided medication today).  -Educated patient on purpose, proper use, potential adverse effects including risk of esophageal candidiasis and need to rinse mouth after each use.  Pt verbalized understanding of treatment plan. Note:  Consider Amiodarone - pulmonary fibrosis with lung evaluation when lung function is improved and stable.

## 2017-09-14 NOTE — Assessment & Plan Note (Signed)
Eliquis dosing is suboptimal at once daily per patient preference.  Advised her to increase to twice daily and she was highly concerned about cost.  Reassess adherence and use at next visit.

## 2017-09-15 ENCOUNTER — Other Ambulatory Visit: Payer: Self-pay

## 2017-09-15 ENCOUNTER — Encounter: Payer: Self-pay | Admitting: Family Medicine

## 2017-09-15 ENCOUNTER — Encounter: Payer: Self-pay | Admitting: Cardiology

## 2017-09-15 ENCOUNTER — Ambulatory Visit (INDEPENDENT_AMBULATORY_CARE_PROVIDER_SITE_OTHER): Payer: Medicare Other | Admitting: Family Medicine

## 2017-09-15 VITALS — BP 108/62 | HR 76 | Temp 98.2°F | Wt 175.0 lb

## 2017-09-15 DIAGNOSIS — I2510 Atherosclerotic heart disease of native coronary artery without angina pectoris: Secondary | ICD-10-CM | POA: Diagnosis not present

## 2017-09-15 DIAGNOSIS — I130 Hypertensive heart and chronic kidney disease with heart failure and stage 1 through stage 4 chronic kidney disease, or unspecified chronic kidney disease: Secondary | ICD-10-CM | POA: Diagnosis not present

## 2017-09-15 DIAGNOSIS — N184 Chronic kidney disease, stage 4 (severe): Secondary | ICD-10-CM | POA: Diagnosis not present

## 2017-09-15 DIAGNOSIS — J449 Chronic obstructive pulmonary disease, unspecified: Secondary | ICD-10-CM

## 2017-09-15 DIAGNOSIS — J441 Chronic obstructive pulmonary disease with (acute) exacerbation: Secondary | ICD-10-CM | POA: Diagnosis not present

## 2017-09-15 DIAGNOSIS — I5032 Chronic diastolic (congestive) heart failure: Secondary | ICD-10-CM | POA: Diagnosis not present

## 2017-09-15 DIAGNOSIS — I482 Chronic atrial fibrillation: Secondary | ICD-10-CM | POA: Diagnosis not present

## 2017-09-15 MED ORDER — ALBUTEROL SULFATE (2.5 MG/3ML) 0.083% IN NEBU: 2.5000 mg | INHALATION_SOLUTION | RESPIRATORY_TRACT | 1 refills | Status: DC | PRN

## 2017-09-15 MED ORDER — PREDNISONE 50 MG PO TABS: 50.0000 mg | ORAL_TABLET | Freq: Every day | ORAL | 0 refills | Status: AC

## 2017-09-15 NOTE — Progress Notes (Signed)
    Subjective:  [name] is a 84 y.o. female who presents to the [facility] today to follow-up on difficulty breathing.   HPI: Patient with history of COPD was seen by Dr. Valentina Lucks yesterday for worsening of COPD's symptoms including wheeze, cough, shortness of breath.  Patient currently treated with duo nebs 4 times a day, Pulmicort.  She has had 2 doses of prednisone.  She reports that her breathing is significantly improved since the prednisone and Pulmicort.  Still has some wheeze that is worse with walking especially in the heat.  Rapidly improves when she is sitting down and resting.  Denies any fevers or chills, chest pain, lower extremity swelling, copious sputum production, hemoptysis.  ROS: Per HPI   Objective:  Physical Exam: BP 108/62   Pulse 76 Comment: ambulatory  Temp 98.2 F (36.8 C) (Oral)   Wt 175 lb (79.4 kg)   SpO2 97% Comment: ambulatory  BMI 29.12 kg/m   Gen: NAD, resting comfortably CV: RRR with no murmurs appreciated Pulm: NWOB, able to talk without getting winded, wheeze heard throughout all lung fields MSK: no edema, cyanosis, or clubbing noted Skin: warm, dry, nail clubbing Neuro: grossly normal, moves all extremities Psych: Normal affect and thought content  No results found for this or any previous visit (from the past 72 hour(s)).   Assessment/Plan:  COPD, severe ([place]) Patient with severe COPD here for follow-up on breathing.  Patient appears to be improved from yesterday with less tachycardia, reassuring ambulatory pulse ox.  Still has systemic wheeze.  Recommend completing steroid burst for additional 3 more days.  Patient to continue duo nebs and Pulmicort as currently prescribed.  Patient advised to use albuterol every 4 hours as needed.  Patient to follow-up in 1 week to reassess respiratory status.   Lab Orders  No laboratory test(s) ordered today    Meds ordered this encounter  Medications  . predniSONE (DELTASONE) 50 MG tablet    Sig: Take 1  tablet (50 mg total) by mouth daily with breakfast for 3 days.    Dispense:  3 tablet    Refill:  0  . albuterol (PROVENTIL) (2.5 MG/3ML) 0.083% nebulizer solution    Sig: Take 3 mLs (2.5 mg total) by nebulization every 4 (four) hours as needed for up to 7 days for wheezing or shortness of breath.    Dispense:  150 mL    Refill:  [place], MD, [place] - PGY1 09/15/2017 5:34 PM

## 2017-09-15 NOTE — Assessment & Plan Note (Addendum)
Patient with severe COPD here for follow-up on breathing.  Patient appears to be improved from yesterday with less tachycardia, reassuring ambulatory pulse ox and afebrile.  Still has significant wheeze. Recommend completing steroid burst for additional 3 more days.  Patient to continue duo nebs and Pulmicort as currently prescribed.  Patient advised to use albuterol every 4 hours as needed.  Patient to follow-up in 1 week to reassess respiratory status.  If not improved, recommend possibly antibiotics chest x-ray and lab work to rule out pneumonia versus COPD exacerbation.

## 2017-09-15 NOTE — Patient Instructions (Addendum)
It was a pleasure to see you today! Thank you for choosing [facility] for your primary care. [name] was seen for COPD. Take prednisone for 3 more days. Continue the Duonebs and Pulmocort as before. You can use your albuterol nebulizer every four hours as needed. Come back to clinic in 1 week to follow up on your breathing, and go to the emergency room if you develop worsening shortness of breath not relieved by your breathing treatments, worsening cough, fever, chills or any other worrying symptoms.   Best,  Marny Lowenstein, MD, MS FAMILY MEDICINE RESIDENT - PGY2 09/15/2017 4:39 PM

## 2017-09-18 DIAGNOSIS — I482 Chronic atrial fibrillation: Secondary | ICD-10-CM | POA: Diagnosis not present

## 2017-09-18 DIAGNOSIS — I130 Hypertensive heart and chronic kidney disease with heart failure and stage 1 through stage 4 chronic kidney disease, or unspecified chronic kidney disease: Secondary | ICD-10-CM | POA: Diagnosis not present

## 2017-09-18 DIAGNOSIS — J441 Chronic obstructive pulmonary disease with (acute) exacerbation: Secondary | ICD-10-CM | POA: Diagnosis not present

## 2017-09-18 DIAGNOSIS — N184 Chronic kidney disease, stage 4 (severe): Secondary | ICD-10-CM | POA: Diagnosis not present

## 2017-09-18 DIAGNOSIS — I5032 Chronic diastolic (congestive) heart failure: Secondary | ICD-10-CM | POA: Diagnosis not present

## 2017-09-18 DIAGNOSIS — I2510 Atherosclerotic heart disease of native coronary artery without angina pectoris: Secondary | ICD-10-CM | POA: Diagnosis not present

## 2017-09-19 DIAGNOSIS — J441 Chronic obstructive pulmonary disease with (acute) exacerbation: Secondary | ICD-10-CM | POA: Diagnosis not present

## 2017-09-19 DIAGNOSIS — I482 Chronic atrial fibrillation: Secondary | ICD-10-CM | POA: Diagnosis not present

## 2017-09-19 DIAGNOSIS — N184 Chronic kidney disease, stage 4 (severe): Secondary | ICD-10-CM | POA: Diagnosis not present

## 2017-09-19 DIAGNOSIS — I2510 Atherosclerotic heart disease of native coronary artery without angina pectoris: Secondary | ICD-10-CM | POA: Diagnosis not present

## 2017-09-19 DIAGNOSIS — I130 Hypertensive heart and chronic kidney disease with heart failure and stage 1 through stage 4 chronic kidney disease, or unspecified chronic kidney disease: Secondary | ICD-10-CM | POA: Diagnosis not present

## 2017-09-19 DIAGNOSIS — I5032 Chronic diastolic (congestive) heart failure: Secondary | ICD-10-CM | POA: Diagnosis not present

## 2017-09-21 ENCOUNTER — Ambulatory Visit (INDEPENDENT_AMBULATORY_CARE_PROVIDER_SITE_OTHER): Payer: Medicare Other | Admitting: Family Medicine

## 2017-09-21 ENCOUNTER — Other Ambulatory Visit: Payer: Self-pay

## 2017-09-21 ENCOUNTER — Encounter: Payer: Self-pay | Admitting: Family Medicine

## 2017-09-21 VITALS — BP 112/58 | HR 78 | Temp 98.3°F | Ht 65.0 in | Wt 175.0 lb

## 2017-09-21 DIAGNOSIS — I480 Paroxysmal atrial fibrillation: Secondary | ICD-10-CM

## 2017-09-21 DIAGNOSIS — I5032 Chronic diastolic (congestive) heart failure: Secondary | ICD-10-CM | POA: Diagnosis not present

## 2017-09-21 DIAGNOSIS — N184 Chronic kidney disease, stage 4 (severe): Secondary | ICD-10-CM

## 2017-09-21 DIAGNOSIS — I2510 Atherosclerotic heart disease of native coronary artery without angina pectoris: Secondary | ICD-10-CM | POA: Diagnosis not present

## 2017-09-21 DIAGNOSIS — I872 Venous insufficiency (chronic) (peripheral): Secondary | ICD-10-CM | POA: Diagnosis not present

## 2017-09-21 DIAGNOSIS — J441 Chronic obstructive pulmonary disease with (acute) exacerbation: Secondary | ICD-10-CM | POA: Diagnosis not present

## 2017-09-21 DIAGNOSIS — Z79899 Other long term (current) drug therapy: Secondary | ICD-10-CM | POA: Diagnosis not present

## 2017-09-21 DIAGNOSIS — I482 Chronic atrial fibrillation: Secondary | ICD-10-CM | POA: Diagnosis not present

## 2017-09-21 DIAGNOSIS — S81819A Laceration without foreign body, unspecified lower leg, initial encounter: Secondary | ICD-10-CM | POA: Insufficient documentation

## 2017-09-21 DIAGNOSIS — J449 Chronic obstructive pulmonary disease, unspecified: Secondary | ICD-10-CM

## 2017-09-21 DIAGNOSIS — S81811A Laceration without foreign body, right lower leg, initial encounter: Secondary | ICD-10-CM | POA: Diagnosis not present

## 2017-09-21 DIAGNOSIS — I130 Hypertensive heart and chronic kidney disease with heart failure and stage 1 through stage 4 chronic kidney disease, or unspecified chronic kidney disease: Secondary | ICD-10-CM | POA: Diagnosis not present

## 2017-09-21 NOTE — Assessment & Plan Note (Addendum)
Will keep Eliquis at 2.5 mg BID given fairly wide fluctuations in her eGFR ;in past. Pt needs annual follow up visit with DR H. Smith (Card). Pt on amiodarone.  Drew CMET and TSH today in anticipation of pt's visit with cardiology.

## 2017-09-21 NOTE — Patient Instructions (Addendum)
Start a cream containing salicylate like Aspercream.  Use as directed.  These creams are over-the-counter.  Start wearing the Compression hose as many days a week as possible.  Put on first thing in the morning and take off last thing at night.  [facility], [address], [place],  [address].   Decrease the "Happy" medicine to inhaling a vial twice a day. Continue inhaling Duoneb four times a day.  Keep the yellow bandage on your leg wound for one week.  If wound is not completely healed after one week, replace the yellow bandage with another.   Dr Aarik Blank will set up an appointment with [name], Cardiologist.  It has been over a year since your last apointment with him.  You are on amiodarone medicine that a Cardiologist must monitor.   We will set up an appointment with Dr [name] (Pharmacist) to have lung testing.

## 2017-09-21 NOTE — Progress Notes (Signed)
 Subjective:    Patient ID: [name], female    DOB: 05/31/1923, 84 y.o.   MRN: [id number] Chalice M Keddy is accompanied by patient and daughter, [name] Sources of clinical information for visit is/are patient and past medical records. Nursing assessment for this office visit was reviewed with the patient for accuracy and revision.  Previous Report(s) Reviewed: ER records and radiology reports  Depression screen PHQ 2/9 09/21/2017  Decreased Interest 0  Down, Depressed, Hopeless 0  PHQ - 2 Score 0  Altered sleeping -  Tired, decreased energy -  Change in appetite -  Feeling bad or failure about yourself  -  Trouble concentrating -  Moving slowly or fidgety/restless -  Suicidal thoughts -  PHQ-9 Score -  Difficult doing work/chores -  Some recent data might be hidden   Fall Risk  09/21/2017 09/15/2017 08/28/2017 06/29/2017 06/08/2017  Falls in the past year? No No No No No  Comment - - - - -  Number falls in past yr: - - - - -  Injury with Fall? - - - - -  Comment - - - - -  Risk Factor Category  - - - - -  Risk for fall due to : - - - - -  Follow up - - - - -    HPI  Leg swelling - longstanding venous insufficiency - History of getting graduated compression stocking that she did not wear bc f style.  - Recent increase ankle swelling bil.  - Worse after standing better after sleep.  - Has not taken lasix in several weeks bc of issues with solitary kidney.   Leg pain and wound - onset in last couple month of tenderness to touch or forelegs - No change in skin color, no fever. - Stuck a table 2 weeks ago and lacerated right posterior foreleg.  Has been treating with home bandages.  Pt thinks it is healing.   COPD - longstanidng issue.  - 6/26 ov with Dr Byrum (Pulm): stopped trelegy bc issue with inhaling adequate dose.  Start duoneb QID via nebulizer.  Pt reportedly did not have a nebulizer at home.  - 7/11 ED visit [facility]: desats ;in 80s with exertion. tx'd with  prednisone and Cefdinir (? For COPD or cellulitis of leg. .  - 7/18 [facility] visit with Pharmacy clinic: Noted DOE.  Dr Koval started prednisone 40 mg x 2 days and Pulmicort (budesonide) neb BID though pt has been using it QID.  Dr Thompson [facility] ov saw pt 7/19 and rx'd 3 more days of prednsone.   - Chesty cough with colored sputum persits.   -  CAT score 22 - On Amiodarone   CKD - longstanding issue - increase in SCr 7/11.  - Solitary kidney - Has not taken lasix in last few weeks.   ADLs - Has PT/ot home visits. - Working on controlled breathing - Goal of being able to get up her indoor stairs with only one rest period.    SH: former smoker. Continued abstinence  Review of Systems See HPI    Objective:   Physical Exam  VS reviewed GEN: Alert, Cooperative, Groomed, NAD COR: RRR, No M/G/R LUNGS: BCTA, No Acc mm use, speaking in full sentences EXT: prominent ankle edema bilaterally with peau de orange bilateral forelegs.  Light touch results in tenderness.  Prominent varicose veins at ankles.  Healing flap laceration with good healing tissue.  No slough. Surrounding tissue slightly edematous   from venous insuff.     Assessment & Plan:  Visit Problem List with A/P  COPD, severe (HCC) Established problem that has improved.  Still with DOE walking 100 feet.  SaO2 97% on room air after walking 200 ft.  CAT score 22.  CXR from 7/3 [facility] ED visit showed hyperinflation and chronic scarring at bases.  Plan: Continue duonebs QID           Decrease Pulmicort to BID from QID           Pt has follow up appointment with Dr Byrum (Pulm) next week.  Dr Koval would like to do PFT's on pt given her amiodarone therapy.   Chronic kidney disease (CKD), stage IV (severe) (HCC) Established problem worsened.  SCR 2.2 7/11, up from BL around 1.6  Recheck today.    Paroxysmal atrial fibrillation (HCC) Will keep Eliquis at 2.5 mg BID given fairly wide fluctuations in her eGFR ;in past. Pt needs  annual follow up visit with DR H. Smith (Card). Pt on amiodarone.  Drew CMET and TSH today in anticipation of pt's visit with cardiology.   Venous insufficiency of both lower extremities Established problem worsened.  Rx graduated knee high compression stockings 20 to 30 mmHg. Wear while awake.  Avoid dosing of lasix as it tends to result in rise of SCr.   Laceration of leg Established problem that has improved.  Flap laceration without evidence of tip necrosis. Place collidal dressing on well healing wound, mostly for protection. Anticipate wound will heal well.        

## 2017-09-21 NOTE — Assessment & Plan Note (Signed)
Established problem worsened.  SCR 2.2 7/11, up from BL around 1.6  Recheck today.

## 2017-09-21 NOTE — Assessment & Plan Note (Addendum)
Established problem that has improved.  Still with DOE walking 100 feet.  SaO2 97% on room air after walking 200 ft.  CAT score 22.  CXR from 7/3 [facility] ED visit showed hyperinflation and chronic scarring at bases.  Plan: Continue duonebs QID           Decrease Pulmicort to BID from QID           Pt has follow up appointment with Dr Lamonte Sakai [facility]) next week.  Dr Valentina Lucks would like to do PFT's on pt given her amiodarone therapy.

## 2017-09-21 NOTE — Assessment & Plan Note (Signed)
Established problem that has improved.  Flap laceration without evidence of tip necrosis. Place collidal dressing on well healing wound, mostly for protection. Anticipate wound will heal well.

## 2017-09-21 NOTE — Assessment & Plan Note (Signed)
Established problem worsened.  Rx graduated knee high compression stockings 20 to 30 mmHg. Wear while awake.  Avoid dosing of lasix as it tends to result in rise of SCr.

## 2017-09-22 ENCOUNTER — Telehealth: Payer: Self-pay | Admitting: Interventional Cardiology

## 2017-09-22 DIAGNOSIS — J441 Chronic obstructive pulmonary disease with (acute) exacerbation: Secondary | ICD-10-CM | POA: Diagnosis not present

## 2017-09-22 DIAGNOSIS — I5032 Chronic diastolic (congestive) heart failure: Secondary | ICD-10-CM | POA: Diagnosis not present

## 2017-09-22 DIAGNOSIS — I2510 Atherosclerotic heart disease of native coronary artery without angina pectoris: Secondary | ICD-10-CM | POA: Diagnosis not present

## 2017-09-22 DIAGNOSIS — I130 Hypertensive heart and chronic kidney disease with heart failure and stage 1 through stage 4 chronic kidney disease, or unspecified chronic kidney disease: Secondary | ICD-10-CM | POA: Diagnosis not present

## 2017-09-22 DIAGNOSIS — N184 Chronic kidney disease, stage 4 (severe): Secondary | ICD-10-CM | POA: Diagnosis not present

## 2017-09-22 DIAGNOSIS — I482 Chronic atrial fibrillation: Secondary | ICD-10-CM | POA: Diagnosis not present

## 2017-09-22 LAB — TSH: TSH: 1.84 u[IU]/mL (ref 0.450–4.500)

## 2017-09-22 LAB — CMP14+EGFR
ALT: 15 IU/L (ref 0–32)
AST: 18 IU/L (ref 0–40)
Albumin/Globulin Ratio: 1.7 (ref 1.2–2.2)
Albumin: 3.8 g/dL (ref 3.2–4.6)
Alkaline Phosphatase: 74 IU/L (ref 39–117)
BUN/Creatinine Ratio: 20 (ref 12–28)
BUN: 36 mg/dL (ref 10–36)
Bilirubin Total: 0.3 mg/dL (ref 0.0–1.2)
CO2: 19 mmol/L — ABNORMAL LOW (ref 20–29)
Calcium: 9.1 mg/dL (ref 8.7–10.3)
Chloride: 108 mmol/L — ABNORMAL HIGH (ref 96–106)
Creatinine, Ser: 1.79 mg/dL — ABNORMAL HIGH (ref 0.57–1.00)
GFR calc Af Amer: 28 mL/min/{1.73_m2} — ABNORMAL LOW (ref >59)
GFR calc non Af Amer: 24 mL/min/{1.73_m2} — ABNORMAL LOW (ref >59)
Globulin, Total: 2.3 g/dL (ref 1.5–4.5)
Glucose: 110 mg/dL — ABNORMAL HIGH (ref 65–99)
Potassium: 4.6 mmol/L (ref 3.5–5.2)
Sodium: 146 mmol/L — ABNORMAL HIGH (ref 134–144)
Total Protein: 6.1 g/dL (ref 6.0–8.5)

## 2017-09-22 NOTE — Telephone Encounter (Signed)
Spoke with pt and she states that she received a letter stating that she needed to make an appt with Dr. Lovena Le for device f/u.  Advised pt I would send message to Dr. Tanna Furry scheduler and have her contact pt to get her scheduled with Dr. Lovena Le.  Did go ahead and schedule pt to see Dr. [name] in August for overdue f/u.  Pt very appreciative for call.

## 2017-09-22 NOTE — Telephone Encounter (Signed)
New Message   Pt is calling upset because she has been trying to make an appt but haven't had any luck. She states she's had to hold and now she just wants to be seen asap. Provided Dr. [name] date of 10/23 and a PA of 9/9 pt states she needs sooner and wants to speak with a nurse. Please call

## 2017-09-25 ENCOUNTER — Encounter: Payer: Self-pay | Admitting: Internal Medicine

## 2017-09-25 ENCOUNTER — Telehealth: Payer: Self-pay | Admitting: Family Medicine

## 2017-09-25 NOTE — Telephone Encounter (Signed)
Appt made for 11-02-17.  She would like to know if she should go back and see Dr. Casper Harrison in [place] to have her breathing checked on.  She doesn't feel that her breathing is getting any better than it has been in the past.  Patient has an upcoming appt with Dr. Malvin Johns and is fine with seeing him but she just wanted to run it by you and get your opinion, she forgot to ask while she was on the phone earlier.  Jazmin Hartsell,CMA

## 2017-09-25 NOTE — Telephone Encounter (Signed)
Please call [name] to set up an appointment to see me in my continuity clinic in late August to early September.  Indication is for leg swelling.   I discussed recent labs result with pt.  Asked her to refrain using the Lasix for ankle swelling as it tends to drive her SCr up.

## 2017-09-26 DIAGNOSIS — I130 Hypertensive heart and chronic kidney disease with heart failure and stage 1 through stage 4 chronic kidney disease, or unspecified chronic kidney disease: Secondary | ICD-10-CM | POA: Diagnosis not present

## 2017-09-26 DIAGNOSIS — J441 Chronic obstructive pulmonary disease with (acute) exacerbation: Secondary | ICD-10-CM | POA: Diagnosis not present

## 2017-09-26 DIAGNOSIS — I482 Chronic atrial fibrillation: Secondary | ICD-10-CM | POA: Diagnosis not present

## 2017-09-26 DIAGNOSIS — I2510 Atherosclerotic heart disease of native coronary artery without angina pectoris: Secondary | ICD-10-CM | POA: Diagnosis not present

## 2017-09-26 DIAGNOSIS — I5032 Chronic diastolic (congestive) heart failure: Secondary | ICD-10-CM | POA: Diagnosis not present

## 2017-09-26 DIAGNOSIS — N184 Chronic kidney disease, stage 4 (severe): Secondary | ICD-10-CM | POA: Diagnosis not present

## 2017-09-28 ENCOUNTER — Other Ambulatory Visit: Payer: Self-pay

## 2017-09-28 DIAGNOSIS — I2510 Atherosclerotic heart disease of native coronary artery without angina pectoris: Secondary | ICD-10-CM | POA: Diagnosis not present

## 2017-09-28 DIAGNOSIS — N184 Chronic kidney disease, stage 4 (severe): Secondary | ICD-10-CM | POA: Diagnosis not present

## 2017-09-28 DIAGNOSIS — I5032 Chronic diastolic (congestive) heart failure: Secondary | ICD-10-CM | POA: Diagnosis not present

## 2017-09-28 DIAGNOSIS — J441 Chronic obstructive pulmonary disease with (acute) exacerbation: Secondary | ICD-10-CM | POA: Diagnosis not present

## 2017-09-28 DIAGNOSIS — I130 Hypertensive heart and chronic kidney disease with heart failure and stage 1 through stage 4 chronic kidney disease, or unspecified chronic kidney disease: Secondary | ICD-10-CM | POA: Diagnosis not present

## 2017-09-28 DIAGNOSIS — I482 Chronic atrial fibrillation: Secondary | ICD-10-CM | POA: Diagnosis not present

## 2017-09-28 NOTE — Patient Outreach (Signed)
[facility]) Care Management  09/28/2017  ANNALEI FRIESZ 1923-08-31 [id number]    1st outreach to the patient for assessment. No answer. HIPAA compliant voicemail left with contact information.  Plan: RN Health Coach will send letter. RN Health Coach will make another attempt to the patient within four business days.  Lazaro Arms RN, BSN, [place] Direct Dial:  [phone]  Fax: [phone]

## 2017-10-02 ENCOUNTER — Telehealth: Payer: Self-pay | Admitting: Family Medicine

## 2017-10-02 DIAGNOSIS — I482 Chronic atrial fibrillation: Secondary | ICD-10-CM | POA: Diagnosis not present

## 2017-10-02 DIAGNOSIS — N184 Chronic kidney disease, stage 4 (severe): Secondary | ICD-10-CM | POA: Diagnosis not present

## 2017-10-02 DIAGNOSIS — I2510 Atherosclerotic heart disease of native coronary artery without angina pectoris: Secondary | ICD-10-CM | POA: Diagnosis not present

## 2017-10-02 DIAGNOSIS — I130 Hypertensive heart and chronic kidney disease with heart failure and stage 1 through stage 4 chronic kidney disease, or unspecified chronic kidney disease: Secondary | ICD-10-CM | POA: Diagnosis not present

## 2017-10-02 DIAGNOSIS — I5032 Chronic diastolic (congestive) heart failure: Secondary | ICD-10-CM | POA: Diagnosis not present

## 2017-10-02 DIAGNOSIS — J441 Chronic obstructive pulmonary disease with (acute) exacerbation: Secondary | ICD-10-CM | POA: Diagnosis not present

## 2017-10-02 NOTE — Telephone Encounter (Signed)
Please ask [name] if she has started wearing the compression hose I prescribed at her last visit.  If not, then she should purchase them from a medical supply shop and start wearing them.  No need to have appointment for  Ankle swelling moved up until she starts wearing compression hose.   OK for home health evaluation. Do I need to put in an order?

## 2017-10-02 NOTE — Telephone Encounter (Signed)
LM for patient to call back so I can inquire about her compression hose.  Also spoke with [name] and gave him verbal orders for home health evaluation.  He states that patient did take one of her lasix this morning due to the swelling and didn't have on her compression hose at the time.  I informed him that patient would need to keep her appointment in September and needs to purchase the hose, if she hasn't done so yet, and see if they help first before we change her appt.  [name] said that he would make sure to reinforce this with patient next time he sees her.  [name],CMA

## 2017-10-02 NOTE — Telephone Encounter (Signed)
[facility]:  [name] is requesting evaluation for home health.  Her legs are continuing swell and the appt for 11-02-17 needs to be moved up . Please advise

## 2017-10-02 NOTE — Telephone Encounter (Signed)
Will forward to MD to give verbal ok for orders to be updated.  Emitt Maglione,CMA

## 2017-10-03 ENCOUNTER — Encounter: Payer: Self-pay | Admitting: Emergency Medicine

## 2017-10-03 ENCOUNTER — Telehealth: Payer: Self-pay

## 2017-10-03 ENCOUNTER — Ambulatory Visit (INDEPENDENT_AMBULATORY_CARE_PROVIDER_SITE_OTHER): Payer: Medicare Other | Admitting: Emergency Medicine

## 2017-10-03 DIAGNOSIS — J441 Chronic obstructive pulmonary disease with (acute) exacerbation: Secondary | ICD-10-CM | POA: Diagnosis not present

## 2017-10-03 DIAGNOSIS — J449 Chronic obstructive pulmonary disease, unspecified: Secondary | ICD-10-CM

## 2017-10-03 DIAGNOSIS — I130 Hypertensive heart and chronic kidney disease with heart failure and stage 1 through stage 4 chronic kidney disease, or unspecified chronic kidney disease: Secondary | ICD-10-CM | POA: Diagnosis not present

## 2017-10-03 DIAGNOSIS — N184 Chronic kidney disease, stage 4 (severe): Secondary | ICD-10-CM | POA: Diagnosis not present

## 2017-10-03 DIAGNOSIS — I5032 Chronic diastolic (congestive) heart failure: Secondary | ICD-10-CM | POA: Diagnosis not present

## 2017-10-03 DIAGNOSIS — I2510 Atherosclerotic heart disease of native coronary artery without angina pectoris: Secondary | ICD-10-CM | POA: Diagnosis not present

## 2017-10-03 DIAGNOSIS — I482 Chronic atrial fibrillation: Secondary | ICD-10-CM | POA: Diagnosis not present

## 2017-10-03 NOTE — Patient Instructions (Addendum)
We will continue Trelegy once a day. Rinse and gargle after using You can stop your budesonide nebulizer treatments Please continue to use DuoNeb (albuterol/ipratropium) nebulizers up to every 6 hours if needed for shortness of breath, wheezing, cough, chest tightness. Depending on how your breathing is doing over the next several months we may need to talk about possibly adding scheduled prednisone. Continue to do your pursed-lip breathing when you are exerting yourself.  This will make exertion easier. Follow with Dr Lamonte Sakai in 2 months or sooner if you have any problems.

## 2017-10-03 NOTE — Telephone Encounter (Signed)
Festus Holts, RN with [facility], called for verbal orders:  Nursing to see 2x/week x 3 weeks and 1x/week x 2 weeks. Also needs parameters for weight.  Weight and edema are decreased this AM.  Call back is [place], RN [facility] RN)

## 2017-10-03 NOTE — Progress Notes (Signed)
GERD  Subjective:    Patient ID: [name], female    DOB: 09-May-1923, 84 y.o.   MRN: [id number]   COPD   She complains of shortness of breath. There is no cough or wheezing. Pertinent negatives include no chest pain, ear pain, fever, headaches, postnasal drip, rhinorrhea, sneezing, sore throat or trouble swallowing. Her past medical history is significant for COPD.    ROV 03/30/17 --Very pleasant 83 year old woman with a history of former tobacco, atrial fibrillation with sick sinus syndrome, hypertension, chronic kidney disease, GERD with a hiatal hernia.  We follow her for her history of tobacco use and COPD.  She has been managed on Trelegy.  She uses fluticasone nasal spray for chronic allergic rhinitis, Prilosec 20 mg daily for GERD.  Uses albuterol rarely until the last week. She usually stays with a daughter, but has been at home on her own since last week. She reports an increase in exertional SOB over the last 10 days, increased congestion and cough, productive of greenish phlegm. Unclear whether there was a trigger - no URI sx, etc.   ROV 06/08/17 --[name] follows up today for her severe COPD.  She is 78, also has atrial fibrillation with sick sinus syndrome, hypertension, chronic kidney disease, GERD with a hiatal hernia.  We have been managing her on Trelegy. She has been living back and forth between her home and her daughter Lisa's home. She was at the beach 2 weeks ago, was having more dyspnea and fatigue, increased wheeze, minimal cough, minimal mucous. She had to use her albuterol more frequently. She has improved now. Did not have to get pred or abx. ? Whether her Trelegy was giving her full dose. She is planning to go on cruise next Saturday.   Acute OV 08/24/17 --and has severe COPD, atrial fibrillation with sick sinus syndrome, hypertension, chronic kidney disease and a hiatal hernia with GERD.  We have been managing her on Trelegy,  But she isn't sure that she is getting it  down adequately.  She is been having a hard time over the last several weeks. She was treated with pred about a month ago, then I gave her doxycycline 6/19 for purulent mucous - has cleared some. I then gave her another pred taper earlier this week for progressive dyspnea and wheeze. She is using albuterol about 2x a day. She wonders if nebs would be better.   ROV 10/03/17 --follow-up visit for patient with severe COPD.  She has chronic bronchitic symptoms, associated cough, wheeze, exertional dyspnea.  She also has a history of atrial fibrillation, hypertension, chronic kidney disease, hiatal hernia and GERD.  She has been treated with rounds of doxycycline and prednisone over the last 2 months.  She was admitted 7/2 for an acute COPD exacerbation, finished pred early July. I temporarily stopped her Trelegy to see if she would benefit more from nebulized therapy. She is now back on trelegy, is doing duoneb bid as well. She is using budesonide nebs as well.   She is having more symptoms now - more dyspnea, especially when she is outside. Her cough is stable. She has started doing pursed-lip breathing.    Review of Systems  Constitutional: Negative for fever and unexpected weight change.  HENT: Positive for congestion. Negative for dental problem, ear pain, nosebleeds, postnasal drip, rhinorrhea, sinus pressure, sneezing, sore throat and trouble swallowing.   Eyes: Negative for redness and itching.  Respiratory: Positive for shortness of breath. Negative for cough, chest  tightness and wheezing.   Cardiovascular: Positive for palpitations. Negative for chest pain and leg swelling.  Gastrointestinal: Negative for nausea and vomiting.  Genitourinary: Negative for dysuria.  Musculoskeletal: Negative for joint swelling.  Skin: Negative for rash.  Neurological: Negative for headaches.  Hematological: Does not bruise/bleed easily.  Psychiatric/Behavioral: Negative for dysphoric mood. The patient is  nervous/anxious.         Objective:   Physical Exam  Vitals:   10/03/17 1519  BP: 122/88  Pulse: 87  SpO2: 94%  Weight: 173 lb (78.5 kg)   Gen: Pleasant, elderly woman in no distress,  normal affect  ENT: No lesions,  mouth clear,  oropharynx clear, no postnasal drip, no stridor  Neck: No JVD, no stridor  Lungs: No use of accessory muscles, coarse B breath sounds, few scattered wheezes.   Cardiovascular: Irregular, heart sounds normal, no murmur or gallops, no peripheral edema  Musculoskeletal: No deformities, no cyanosis or clubbing  Neuro: alert, non focal  Skin: Warm, no lesions or rashes      Assessment & Plan:  COPD, severe (HCC) Severe disease that is limiting despite maximal bronchodilator therapy.  She has had recent exacerbations and we might consider initiation of Daliresp.  I also asked her to consider the possibility that we may need to start scheduled daily low-dose prednisone at some point in the near future.  She is back on Trelegy so she does not need to budesonide nebulizers.  She can use the DuoNeb's as needed.  She did not desaturate at her last visit so I cannot qualify her for supplemental oxygen. Close follow up.   We will continue Trelegy once a day. Rinse and gargle after using You can stop your budesonide nebulizer treatments Please continue to use DuoNeb (albuterol/ipratropium) nebulizers up to every 6 hours if needed for shortness of breath, wheezing, cough, chest tightness. Depending on how your breathing is doing over the next several months we may need to talk about possibly adding scheduled prednisone. Continue to do your pursed-lip breathing when you are exerting yourself.  This will make exertion easier. Follow with Dr Lamonte Sakai in 2 months or sooner if you have any problems.  Baltazar Apo, MD, PhD 10/03/2017, 3:41 PM [facility] [phone] or if no answer [phone]

## 2017-10-03 NOTE — Assessment & Plan Note (Signed)
Severe disease that is limiting despite maximal bronchodilator therapy.  She has had recent exacerbations and we might consider initiation of Daliresp.  I also asked her to consider the possibility that we may need to start scheduled daily low-dose prednisone at some point in the near future.  She is back on Trelegy so she does not need to budesonide nebulizers.  She can use the DuoNeb's as needed.  She did not desaturate at her last visit so I cannot qualify her for supplemental oxygen. Close follow up.   We will continue Trelegy once a day. Rinse and gargle after using You can stop your budesonide nebulizer treatments Please continue to use DuoNeb (albuterol/ipratropium) nebulizers up to every 6 hours if needed for shortness of breath, wheezing, cough, chest tightness. Depending on how your breathing is doing over the next several months we may need to talk about possibly adding scheduled prednisone. Continue to do your pursed-lip breathing when you are exerting yourself.  This will make exertion easier. Follow with Dr Lamonte Sakai in 2 months or sooner if you have any problems.

## 2017-10-04 ENCOUNTER — Ambulatory Visit: Payer: Self-pay

## 2017-10-04 ENCOUNTER — Telehealth: Payer: Self-pay

## 2017-10-04 ENCOUNTER — Other Ambulatory Visit: Payer: Self-pay | Admitting: Family Medicine

## 2017-10-04 DIAGNOSIS — I2510 Atherosclerotic heart disease of native coronary artery without angina pectoris: Secondary | ICD-10-CM | POA: Diagnosis not present

## 2017-10-04 DIAGNOSIS — J441 Chronic obstructive pulmonary disease with (acute) exacerbation: Secondary | ICD-10-CM | POA: Diagnosis not present

## 2017-10-04 DIAGNOSIS — I5032 Chronic diastolic (congestive) heart failure: Secondary | ICD-10-CM | POA: Diagnosis not present

## 2017-10-04 DIAGNOSIS — I482 Chronic atrial fibrillation: Secondary | ICD-10-CM | POA: Diagnosis not present

## 2017-10-04 DIAGNOSIS — J449 Chronic obstructive pulmonary disease, unspecified: Secondary | ICD-10-CM

## 2017-10-04 DIAGNOSIS — I130 Hypertensive heart and chronic kidney disease with heart failure and stage 1 through stage 4 chronic kidney disease, or unspecified chronic kidney disease: Secondary | ICD-10-CM | POA: Diagnosis not present

## 2017-10-04 DIAGNOSIS — N184 Chronic kidney disease, stage 4 (severe): Secondary | ICD-10-CM | POA: Diagnosis not present

## 2017-10-04 MED ORDER — BUDESONIDE 0.5 MG/2ML IN SUSP: 0.5000 mg | Freq: Two times a day (BID) | RESPIRATORY_TRACT | 12 refills | Status: DC

## 2017-10-04 NOTE — Telephone Encounter (Signed)
Mardene Celeste from [facility] called to report patient has a blood-filled blister to the outer aspect of her left ankle.   Please call if any new nursing orders need to be added to address this.  [place], RN [facility] RN)

## 2017-10-05 DIAGNOSIS — I482 Chronic atrial fibrillation: Secondary | ICD-10-CM | POA: Diagnosis not present

## 2017-10-05 DIAGNOSIS — N184 Chronic kidney disease, stage 4 (severe): Secondary | ICD-10-CM | POA: Diagnosis not present

## 2017-10-05 DIAGNOSIS — H353221 Exudative age-related macular degeneration, left eye, with active choroidal neovascularization: Secondary | ICD-10-CM | POA: Diagnosis not present

## 2017-10-05 DIAGNOSIS — H353211 Exudative age-related macular degeneration, right eye, with active choroidal neovascularization: Secondary | ICD-10-CM | POA: Diagnosis not present

## 2017-10-05 DIAGNOSIS — I130 Hypertensive heart and chronic kidney disease with heart failure and stage 1 through stage 4 chronic kidney disease, or unspecified chronic kidney disease: Secondary | ICD-10-CM | POA: Diagnosis not present

## 2017-10-05 DIAGNOSIS — J441 Chronic obstructive pulmonary disease with (acute) exacerbation: Secondary | ICD-10-CM | POA: Diagnosis not present

## 2017-10-05 DIAGNOSIS — I5032 Chronic diastolic (congestive) heart failure: Secondary | ICD-10-CM | POA: Diagnosis not present

## 2017-10-05 DIAGNOSIS — I2510 Atherosclerotic heart disease of native coronary artery without angina pectoris: Secondary | ICD-10-CM | POA: Diagnosis not present

## 2017-10-06 ENCOUNTER — Other Ambulatory Visit: Payer: Self-pay

## 2017-10-06 NOTE — Telephone Encounter (Signed)
Call orders to [facility] RN [facility] ordersNursing to see 2x/week x 3 weeks and 1x/week x 2 weeks.   Call [facility] if weight great than 180 lbs or less than 170 lbs.

## 2017-10-06 NOTE — Patient Outreach (Signed)
[facility]) Care Management  10/06/2017   [name] 1924/02/21 [id number]  Subjective: Successful outreach to the patient for assessment. HIPAA verified. The patient states that she is doing better. Her breathing is better. Reviewed signs and symptoms of COPD, discussed the action plan.  Patient verbalized understanding.   She has her nebulizer and has decrease to two times a day.  The swelling in her ankles and feet are resolving. She had an appointment with Dr. Wendy Poet on 7/25. She has [facility] coming to the home 2-3 times per week for Nursing, PT and OT.  She is routinely taking her meds. She denies any pain or falls.  Her next appointment with Dr McDiarmid is on September 5 th.  Current Medications:  Current Outpatient Medications  Medication Sig Dispense Refill  . amiodarone (PACERONE) 200 MG tablet TAKE 1 TABLET(200 MG) BY MOUTH DAILY 90 tablet 0  . aspirin 81 MG chewable tablet Chew 81 mg by mouth daily.    Marland Kitchen atorvastatin (LIPITOR) 20 MG tablet TAKE 1 TABLET BY MOUTH DAILY 90 tablet 3  . budesonide (PULMICORT) 0.5 MG/2ML nebulizer solution Take 2 mLs (0.5 mg total) by nebulization 2 (two) times daily. 120 mL 12  . calcium carbonate (OSCAL) 1500 (600 Ca) MG TABS tablet Take 600 mg of elemental calcium by mouth 2 (two) times daily with a meal.    . Cholecalciferol 1000 units tablet Take 1 tablet by mouth daily.     Marland Kitchen docusate sodium (COLACE) 100 MG capsule Take 100 mg by mouth daily.    Marland Kitchen ELIQUIS 2.5 MG TABS tablet TAKE 1 TABLET BY MOUTH TWICE DAILY 60 tablet 5  . fluticasone (FLONASE) 50 MCG/ACT nasal spray SHAKE LIQUID AND USE 2 SPRAYS IN EACH NOSTRIL DAILY 48 g 3  . furosemide (LASIX) 40 MG tablet Take 20 mg by mouth daily as needed.    . metoprolol succinate (TOPROL-XL) 25 MG 24 hr tablet TAKE 1 TABLET(25 MG) BY MOUTH DAILY 90 tablet 3  . nitroGLYCERIN (NITROSTAT) 0.4 MG SL tablet Place 1 tablet (0.4 mg total) under the tongue every 5 (five) minutes as needed for  chest pain (x 3 tabs). 25 tablet PRN  . omeprazole (PRILOSEC) 20 MG capsule Take 1 capsule (20 mg total) by mouth daily. 90 capsule 3  . traMADol (ULTRAM) 50 MG tablet TAKE 1 TABLET BY MOUTH EVERY 6 HOURS AS NEEDED FOR PAIN 30 tablet 5  . vitamin B-12 (CYANOCOBALAMIN) 1000 MCG tablet Take 1 tablet (1,000 mcg total) by mouth daily. 90 tablet 3  . albuterol (PROVENTIL) (2.5 MG/3ML) 0.083% nebulizer solution Take 3 mLs (2.5 mg total) by nebulization every 4 (four) hours as needed for up to 7 days for wheezing or shortness of breath. 150 mL 1  . cephALEXin (KEFLEX) 500 MG capsule Take 500 mg by mouth 2 (two) times daily.  0  . ipratropium-albuterol (DUONEB) 0.5-2.5 (3) MG/3ML SOLN USE 3 ML VIA NEBULIZER EVERY 4 HOURS 540 mL 0   No current facility-administered medications for this visit.     Functional Status:  In your present state of health, do you have any difficulty performing the following activities: 08/30/2017  Hearing? N  Vision? N  Difficulty concentrating or making decisions? N  Walking or climbing stairs? Y  Dressing or bathing? N  Doing errands, shopping? Y  Some recent data might be hidden    Fall/Depression Screening: Fall Risk  10/06/2017 09/21/2017 09/15/2017  Falls in the past year? No No No  Comment - - -  Number falls in past yr: - - -  Injury with Fall? - - -  Comment - - -  Risk Factor Category  - - -  Risk for fall due to : - - -  Follow up - - -   PHQ 2/9 Scores 09/21/2017 09/15/2017 06/29/2017 06/08/2017 01/12/2017 01/10/2017 10/27/2016  PHQ - 2 Score 0 0 0 3 4 0 0  PHQ- 9 Score - - - 5 6 - -    Assessment: Patient will continue to benefit from health coach outreach for disease management and support.  THN CM Care Plan Problem One     Most Recent Value  THN Long Term Goal   in 60 days the patient will not have any hospital admissions  [facility] Long Term Goal Start Date  10/06/17  Interventions for Problem One Long Term Goal  Reviewed signs and symptoms of COPD,  discussed the action plan and medication adherence     Plan: RN Health Coach will contact patient in the month of October and patient agrees to next outreach.  Lazaro Arms RN, BSN, [place] Direct Dial:  [phone]  Fax: [phone]

## 2017-10-06 NOTE — Telephone Encounter (Signed)
Ms Adee is to wear the prescribed compression hose (20-30 mmHg compression) all day on both legs until she goes to bed.    If she will not wear them, then please firmly wrap her edematous ankles in in a few layers  of gauze up to knee then a couple layers of coban up to knee to provide compression therapy for patient'svenous insufficieny.  Please place several gauze pads over the blister to absorb fluid when it bursts, then wrap with gauze layers, then coban layers.  She is too use her lasix no more than one tablet twice a week.  This is to avoid acute kidney injury which Ms Locascio gets easily with the use of Lasix.  She is to keep her leg elevated to the level of her heart for 2 hours for every one hour upright.  Avoid salt and foods that taste salty or are known have a lot of salt in them.  If legs worsen while on compression wrap or compression hose are in place, then patient will need an Unna boot to leg with blister and we will make a  referral to vascular and vein surgery.

## 2017-10-06 NOTE — Telephone Encounter (Signed)
Attempted to call Festus Holts with verbal orders, no answer. I left a VM to have her return my call, as I was not sure if her VM is compliant.

## 2017-10-09 ENCOUNTER — Other Ambulatory Visit: Payer: Self-pay | Admitting: *Deleted

## 2017-10-09 DIAGNOSIS — J449 Chronic obstructive pulmonary disease, unspecified: Secondary | ICD-10-CM

## 2017-10-09 MED ORDER — BUDESONIDE 0.5 MG/2ML IN SUSP: 0.5000 mg | Freq: Two times a day (BID) | RESPIRATORY_TRACT | 12 refills | Status: DC

## 2017-10-09 NOTE — Telephone Encounter (Signed)
Verbal orders given  

## 2017-10-10 DIAGNOSIS — I5032 Chronic diastolic (congestive) heart failure: Secondary | ICD-10-CM | POA: Diagnosis not present

## 2017-10-10 DIAGNOSIS — I2510 Atherosclerotic heart disease of native coronary artery without angina pectoris: Secondary | ICD-10-CM | POA: Diagnosis not present

## 2017-10-10 DIAGNOSIS — I130 Hypertensive heart and chronic kidney disease with heart failure and stage 1 through stage 4 chronic kidney disease, or unspecified chronic kidney disease: Secondary | ICD-10-CM | POA: Diagnosis not present

## 2017-10-10 DIAGNOSIS — N184 Chronic kidney disease, stage 4 (severe): Secondary | ICD-10-CM | POA: Diagnosis not present

## 2017-10-10 DIAGNOSIS — I482 Chronic atrial fibrillation: Secondary | ICD-10-CM | POA: Diagnosis not present

## 2017-10-10 DIAGNOSIS — J441 Chronic obstructive pulmonary disease with (acute) exacerbation: Secondary | ICD-10-CM | POA: Diagnosis not present

## 2017-10-10 NOTE — Telephone Encounter (Signed)
[name] with [facility] called back about this. Given all of PCPs instructions.   Danley Danker, RN [facility] RN)

## 2017-10-11 DIAGNOSIS — H353211 Exudative age-related macular degeneration, right eye, with active choroidal neovascularization: Secondary | ICD-10-CM | POA: Diagnosis not present

## 2017-10-13 DIAGNOSIS — Z8551 Personal history of malignant neoplasm of bladder: Secondary | ICD-10-CM | POA: Diagnosis not present

## 2017-10-13 DIAGNOSIS — Z8553 Personal history of malignant neoplasm of renal pelvis: Secondary | ICD-10-CM | POA: Diagnosis not present

## 2017-10-13 DIAGNOSIS — J441 Chronic obstructive pulmonary disease with (acute) exacerbation: Secondary | ICD-10-CM | POA: Diagnosis not present

## 2017-10-13 DIAGNOSIS — I482 Chronic atrial fibrillation: Secondary | ICD-10-CM | POA: Diagnosis not present

## 2017-10-13 DIAGNOSIS — I5032 Chronic diastolic (congestive) heart failure: Secondary | ICD-10-CM | POA: Diagnosis not present

## 2017-10-13 DIAGNOSIS — N184 Chronic kidney disease, stage 4 (severe): Secondary | ICD-10-CM | POA: Diagnosis not present

## 2017-10-13 DIAGNOSIS — I2510 Atherosclerotic heart disease of native coronary artery without angina pectoris: Secondary | ICD-10-CM | POA: Diagnosis not present

## 2017-10-13 DIAGNOSIS — I130 Hypertensive heart and chronic kidney disease with heart failure and stage 1 through stage 4 chronic kidney disease, or unspecified chronic kidney disease: Secondary | ICD-10-CM | POA: Diagnosis not present

## 2017-10-13 HISTORY — PX: CYSTOSCOPY: SUR368

## 2017-10-15 NOTE — Progress Notes (Signed)
Cardiology Office Note:    Date:  10/16/2017   ID:  [name], DOB March 30, 1923, MRN [id number]  PCP:  McDiarmid, Blane Ohara, MD  Cardiologist:  No primary care provider on file.   Referring MD: McDiarmid, Blane Ohara, MD   Chief Complaint  Patient presents with  . Congestive Heart Failure  . Atrial Fibrillation    History of Present Illness:    [name] is a 84 y.o. female with a hx of coronary stent, paroxysmal atrial fibrillation but rhythm control on amiodarone therapy, chronic anticoagulation therapy, chronic diastolic heart failure, hypertension, and pacemaker for tachybradycardia syndrome.  She has noticed lower extremity swelling, progressive dyspnea on exertion, cough, and weakness over the past 2 weeks.  The cough and shortness of breath have been intermittent.  She denies chest pain.  She have a case compliance with her current medical regimen.  She has not needed nitroglycerin.  She sleeps on 2 pillows which is not changed.  She has not had syncope.  Past Medical History:  Diagnosis Date  . Abnormal mammogram, unspecified 08/23/2010   Followup imaging reassuring.   . Acute appendicitis with rupture   . ADENOMATOUS COLONIC POLYP 03/01/2003   Qualifier: Diagnosis of  By: McDiarmid MD, Sherren Mocha Multiple benign polyps of cecum, ascending, transverse and sigmoid colon by 8/09 colonoscopy by Dr Cristina Gong  Colonoscopy by Dr Cristina Gong for iron-deficiency anemia on 04/27/2010 showed three sessile polyps that were in ascending (3 mm x 9 mm), transverse (4 mm), and cecum (3 mm).  All three were tubular adenomas that were negative for high grade dysplasia or malignancy on pathology. Dr Cristina Gong called the polpys benign and not requiring follow-up in view of the patients age.    . Adrenal adenoma    Incidentaloma  . AF (paroxysmal atrial fibrillation) ([place]) 11/11/2010   Hospitalization (9/8-9/10, Dr Daneen Schick, III, Cardiology) for Paroxysmal Atrial Fibrillation with RVR and anginal pain secondary to  demand/supply mismatch in setting of RVR with known circumflex artery branch disease.    Marland Kitchen ANXIETY 04/27/2006   Qualifier: Diagnosis of  By: McDiarmid MD, Sherren Mocha    . Benign essential tremor 02/04/2016  . Candidiasis of the esophagus 11/26/2007  . CAP (community acquired pneumonia) 02/25/2015  . Cataract 2013   Bilateral   . Cellulitis of leg, right 10/01/2012  . Cellulitis of right lower extremity   . Chest pain with moderate risk of acute coronary syndrome 05/21/2015  . Chronic kidney disease (CKD), stage III (moderate) (HCC)   . Concussion with loss of consciousness   . COPD 04/27/2006   Qualifier: Diagnosis of  By: McDiarmid MD, Sherren Mocha    . COPD exacerbation ([place])   . COPD, severe   . Decreased functional mobility and endurance 03/17/2015  . Diastolic heart failure ([place]) 07/30/2015  . DISC WITH RADICULOPATHY 04/27/2006   Qualifier: Diagnosis of  By: McDiarmid MD, Sherren Mocha    . Dyspnea   . EDEMA-LEGS,DUE TO VENOUS OBSTRUCT. 04/27/2006   Qualifier: Diagnosis of  By: McDiarmid MD, Sherren Mocha    . 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 Normal uric acid serum level at time of attack.    Marland Kitchen HERNIA, HIATAL, NONCONGENITAL 04/27/2006   Qualifier: Diagnosis of  By: McDiarmid MD, Sherren Mocha    . High risk medications (not anticoagulants) long-term use 03/05/2012  . History of Hemorrhoids 04/27/2006   Qualifier: Diagnosis of  By: McDiarmid MD, Sherren Mocha    .  History of iron deficiency 01/16/2015  . Hx of colonoscopy with polypectomy 04/27/2010   Dr Cristina Gong found three  tubular adenomas each less than 10 mm size  . Hypertension   . Hypotension 07/09/2015  . Iron deficiency anemia 08/05/2010   Dr Cristina Gong (GI) has evaluated with EGD, colonoscopy, and video capsular endoscopy in 2011 & 2012.  All have been unrevealing as to an origin of IDA.  OV with Dr Cristina Gong (10/28/10) assessment of blood in stool per hemoccult and GER. Hbg 12.1 g/dL, MCV 91.8, Ferritin 30  ng/mL. Patient taking on ferrous sulfate tab daily.   EGD on 03/06/12 by Dr Cristina Gong for IDA non-obstructing Schatzki's ring at Gastroesophageal junction, otherwise normal esophagus and stomach.     . Leg cramps 05/29/2012  . Lumbar herniated disc    History of HNP L4/5 in 2003  . Macular degeneration, bilateral 10/04/2010   Right eye is wet MD, the other is dry macular degeneration (ARMD). Pt undergoing some form of vascular endothelial growth factor inhibition intraocular therapy.    . Mild cognitive impairment 10/26/2012   (10/25/12) Failed MiniCog screen  . MUSCLE CRAMPS 03/11/2010   Qualifier: Diagnosis of  By: McDiarmid MD, Sherren Mocha    . Muscle spasm of back 09/24/2013  . Myocardial infarct, old   . Nocturia 10/28/2016  . Numbness and tingling in hands 07/21/2011  . Pacemaker    MDT  . PREDIABETES 09/11/2007   Qualifier: Diagnosis of  By: McDiarmid MD, Sherren Mocha    . Retinal hemorrhage of left eye 06/2010  . RHINITIS, ALLERGIC 04/27/2006   Qualifier: Diagnosis of  By: McDiarmid MD, Sherren Mocha    . SCHATZKI'S RING, HX OF 11/26/2007   Qualifier: Diagnosis of  By: McDiarmid MD, Sherren Mocha  An EGD was performed by Dr Cristina Gong on 04/27/2010 for iron deficiency anemia. There was a a transient hiatal hernia with Schatzki's ring. Stomach and duodenum were normal. EGD on 03/06/12 by Dr Cristina Gong for IDA non-obstructing Schatzki's ring at Gastroesophageal junction, otherwise normal esophagus and stomach.    . Seborrheic keratosis, right anterior thigh 12/12/2013  . Shoulder pain, left 01/09/2014  . Sick sinus syndrome with tachycardia ([place])    MDT  . Soft tissue injury of foot 05/03/2011  . Solar lentigo 06/15/2012  . Solitary kidney, acquired 05/17/2010   Surgical removal for transitional cell cancer by Tresa Endo, MD ([place]). Surveillance cystoscopy by Dr Alinda Money [facility]) on 10/19/12 without evidence of cystoscopic recurrence. Recommend RTC one year for cystoscopy.   Marland Kitchen Spinal stenosis, lumbar   . Transitional cell carcinoma  of ureter, history   . Urge incontinence 12/13/2011   Diagnosed in 10/2011 by Dr Bjorn Loser (Urology)   . VENTRICULAR HYPERTROPHY, LEFT 08/28/2008   Qualifier: Diagnosis of  By: McDiarmid MD, Sherren Mocha    . VITAMIN B12 DEFICIENCY 10/07/2009   Qualifier: Diagnosis of  By: McDiarmid MD, Sherren Mocha  Dx based on a post-TKR anemia work-up Low normal serum B12 with high Methylmalonic acid and homocysteine level  Vit B12 serum level (10/28/10) > 1500 pg/mL   . Vitamin D deficiency 11/02/2010   Serum vitamin D 25(OH) = 10.9 ng/mL (30 -100) on 10/28/10 c/w Vitamin D deficiency.       Past Surgical History:  Procedure Laterality Date  . BREAST SURGERY     breast reduction  . CHOLECYSTECTOMY    . CORONARY ANGIOPLASTY WITH STENT PLACEMENT  2010   BMS RCA, OM2 occluded  . CYSTOSCOPY  09/30/2015   No cystoscopic evidence of uroepithelial  neoplasm.  Follow up surveillance cystoscopy in one year  . DUPUYTREN CONTRACTURE RELEASE Right 05/22/2014   Procedure: DUPUYTREN RELEASE AND REPAIR AS NECESSARY RIGHT RING FINGER AND MIDDLE FINGER;  Surgeon: Roseanne Kaufman, MD;  Location: [place];  Service: Orthopedics;  Laterality: Right;  . ESOPHAGOGASTRODUODENOSCOPY  04/27/2010   Dr Cristina Gong - found transient H/H & Schatzki's ring  . ESOPHAGOGASTRODUODENOSCOPY ENDOSCOPY  03/06/2012   Dr Cristina Gong - found non-obstuctive Schatzki's ring at [employer] [facility]. o/w normal EGD.   Marland Kitchen EYE SURGERY     bilateral cataracts  . KNEE ARTHROSCOPY W/ SYNOVECTOMY  11/2009, left knee   Dr Wynelle Link  . NEPHRECTOMY  For transition cell cancer    Dr Tresa Endo, surgeon  . PACEMAKER GENERATOR CHANGE N/A 11/12/2012   Procedure: PACEMAKER GENERATOR CHANGE;  Surgeon: Evans Lance, MD; [employer] [facility] dual-chamber pacemaker serial number [id number]; Laterality: Right  . PACEMAKER INSERTION  2005   Dr Doreatha Lew  . PACEMAKER LEAD REMOVAL  2005   Removal and reinsertion of atrial and ventricular leads due to migration  . REPLACEMENT TOTAL KNEE  2009, Right knee    Dr Wynelle Link  . REPLACEMENT TOTAL KNEE  05/2009, Left knee   Dr Wynelle Link  . TONSILLECTOMY    . TRIGGER FINGER RELEASE Right 05/22/2014   Procedure: RIGHT HAND A-1 PULLEY RELEASE ;  Surgeon: Roseanne Kaufman, MD;  Location: [place];  Service: Orthopedics;  Laterality: Right;    Current Medications: Current Meds  Medication Sig  . aspirin 81 MG chewable tablet Chew 81 mg by mouth daily.  Marland Kitchen atorvastatin (LIPITOR) 20 MG tablet TAKE 1 TABLET BY MOUTH DAILY  . budesonide (PULMICORT) 0.5 MG/2ML nebulizer solution Take 2 mLs (0.5 mg total) by nebulization 2 (two) times daily.  . calcium carbonate (OSCAL) 1500 (600 Ca) MG TABS tablet Take 600 mg of elemental calcium by mouth 2 (two) times daily with a meal.  . cephALEXin (KEFLEX) 500 MG capsule Take 500 mg by mouth 2 (two) times daily.  . Cholecalciferol 1000 units tablet Take 1 tablet by mouth daily.   Marland Kitchen docusate sodium (COLACE) 100 MG capsule Take 100 mg by mouth daily.  Marland Kitchen ELIQUIS 2.5 MG TABS tablet TAKE 1 TABLET BY MOUTH TWICE DAILY  . fluticasone (FLONASE) 50 MCG/ACT nasal spray SHAKE LIQUID AND USE 2 SPRAYS IN EACH NOSTRIL DAILY  . ipratropium-albuterol (DUONEB) 0.5-2.5 (3) MG/3ML SOLN USE 3 ML VIA NEBULIZER EVERY 4 HOURS  . metoprolol succinate (TOPROL-XL) 25 MG 24 hr tablet TAKE 1 TABLET(25 MG) BY MOUTH DAILY  . nitroGLYCERIN (NITROSTAT) 0.4 MG SL tablet Place 1 tablet (0.4 mg total) under the tongue every 5 (five) minutes as needed for chest pain (x 3 tabs).  Marland Kitchen omeprazole (PRILOSEC) 20 MG capsule Take 1 capsule (20 mg total) by mouth daily.  . traMADol (ULTRAM) 50 MG tablet TAKE 1 TABLET BY MOUTH EVERY 6 HOURS AS NEEDED FOR PAIN  . vitamin B-12 (CYANOCOBALAMIN) 1000 MCG tablet Take 1 tablet (1,000 mcg total) by mouth daily.  . [DISCONTINUED] amiodarone (PACERONE) 200 MG tablet TAKE 1 TABLET(200 MG) BY MOUTH DAILY  . [DISCONTINUED] furosemide (LASIX) 40 MG tablet Take 20 mg by mouth daily as needed.     Allergies:   Baclofen; Codeine phosphate;  Hydrochlorothiazide; Montelukast sodium; and Norco [hydrocodone-acetaminophen]   Social History   Socioeconomic History  . Marital status: Widowed    Spouse name: Not on file  . Number of children: 4  . Years of education: Not on file  .  Highest education level: Not on file  Occupational History  . 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 Financial resource strain: Not on file  . Food insecurity:    Worry: Not on file    Inability: Not on file  . Transportation needs:    Medical: Not on file    Non-medical: Not on file  Tobacco Use  . Smoking status: Former Smoker    Packs/day: 1.00    Types: Cigarettes    Last attempt to quit: 03/02/2009    Years since quitting: 8.6  . Smokeless tobacco: Never Used  Substance and Sexual Activity  . Alcohol use: Yes    Alcohol/week: 13.0 standard drinks    Types: 7 Glasses of wine, 6 Standard drinks or equivalent per week    Comment: 5 oz white wine per day  . Drug use: No  . Sexual activity: Never  Lifestyle  . Physical activity:    Days per week: Not on file    Minutes per session: Not on file  . Stress: Not on file  Relationships  . Social connections:    Talks on phone: Not on file    Gets together: Not on file    Attends religious service: Not on file    Active member of club or organization: Not on file    Attends meetings of clubs or organizations: Not on file    Relationship status: Not on file  Other Topics Concern  . 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    Semi-retired Futures trader.     Pt has home nebulizer for inhalation therapies.   Former Smoker   Smoking Status:  quit > 5 years ago      (+) DNR status per discussion with Dr McDiarmid 10/25/12 and reiterated 06/20/13 office visit .     (+) Living Will/Advance Directive   (+) HC-POA: Cecile Sheerer Causey (pt's dgt)      Current Social History 09/22/2016        Patient lives with daughter, [name] (flight attendant), in two level home 09/22/2016   Transportation: Patient has own vehicle 09/22/2016   Important Relationships 4 daughters and 50 yo grandson 09/22/2016    Pets: None 09/22/2016   Education / Work:  College/ Retired Futures trader 09/22/2016   Interests / Fun: Reads 2 books per week, watches TV, entertains 09/22/2016   Current Stressors: 84 yo house in need of outside repairs 09/22/2016   Religious / Personal Beliefs: Joanie [place]ding[place] 09/22/2016   Other: Had a wonderful marriage/ Very thankful/ wants to continue traveling/ closest friends are all gone 09/22/2016   L. Silvano Rusk, RN, BSN  Family History: The patient's family history includes Dementia in her mother; Heart attack in her father; Heart disease in her father.  ROS:   Please see the history of present illness.    , Denies chills and fever.  Orthopnea, 2 pillow.  Significant lower extremity swelling slowly progressive.  Exertional fatigue.  Constipation.  All other systems reviewed and are negative.  EKGs/Labs/Other Studies Reviewed:    The following studies were reviewed today: None  EKG:  EKG is  ordered today.  The ekg ordered today demonstrates atrial fib with rapid ventricular response.  Left bundle branch block.  Recent Labs: 08/29/2017: B Natriuretic Peptide 319.1; Hemoglobin 12.2; Platelets 227 09/21/2017: ALT 15; BUN 36; Creatinine, Ser 1.79; Potassium 4.6; Sodium 146; TSH 1.840  Recent Lipid Panel    Component Value Date/Time   CHOL 130 08/11/2016 1101   TRIG 106 08/11/2016 1101   HDL 37 (L) 08/11/2016 1101   CHOLHDL 3.5 08/11/2016 1101   CHOLHDL 3.4 07/30/2015 1231   VLDL 29 07/30/2015 1231   LDLCALC 72 08/11/2016 1101   LDLDIRECT 74  05/08/2014 1110    Physical Exam:    VS:  BP 140/84   Pulse (!) 120   Ht 5' 5"  (1.651 m)   Wt 178 lb (80.7 kg)   BMI 29.62 kg/m     Wt Readings from Last 3 Encounters:  10/16/17 178 lb (80.7 kg)  10/03/17 173 lb (78.5 kg)  09/21/17 175 lb (79.4 kg)     GEN: Elderly and frail.  Well nourished, well developed in no acute distress. HEENT: Normal NECK there is moderate JVD. LYMPHATICS: No lymphadenopathy CARDIAC: IIRR, 2/6 systolic murmur, no gallop, 2-3 + edema bilateral lower extremities from the ankles to the knee.Marland Kitchen VASCULAR: 2+ pulses. no bruits. RESPIRATORY:  Clear to auscultation without rales, wheezing or rhonchi  ABDOMEN: Soft, non-tender, non-distended, No pulsatile mass, MUSCULOSKELETAL: No deformity  SKIN: Warm and dry NEUROLOGIC:  Alert and oriented x 3 PSYCHIATRIC:  Normal affect   ASSESSMENT:    1. Atrial fibrillation with RVR ([place])   2. Acute on chronic diastolic heart failure ([place])   3. Chronic anticoagulation   4. Pacemaker   5. On amiodarone therapy   6. COPD, severe ([place])   7. Chronic kidney disease (CKD), stage IV (severe) ([place])   8. Chronic diastolic CHF (congestive heart failure) ([place])   9. SICK SINUS SYNDROME    PLAN:    In order of problems listed above:  1. Atrial fibrillation with rapid ventricular response.  The tachycardia and rhythm change has not been recognized by the patient and therefore I have no indication of how long it is been present.  Progressive shortness of breath and lower extremity swelling has been occurring over the past 2 weeks. 2. She is known to have normal LV systolic function.  She has had acute on chronic diastolic heart failure in the past when atrial fibrillation is present.  Current volume overload and dyspnea are related to acute on chronic diastolic heart failure likely all secondary to atrial fibrillation of unknown duration. 3. She has been on apixaban 2.5 mg twice daily due to age and decreased kidney function.   She advocates compliance with therapy and no missed doses. 4. Permanent pacemaker for sick sinus syndrome/tachybradycardia syndrome. 5. Currently taking 200 mg of amiodarone daily.  This has been chronic therapy.  Patient is being admitted to the hospital from the office for rate control, IV diuresis, surveillance of already abnormal kidney function (one  kidney) in this elderly and frail female who has developed progressive dyspnea on exertion and wheezing over the past 1 to 2 weeks.     Medication Adjustments/Labs and Tests Ordered: Current medicines are reviewed at length with the patient today.  Concerns regarding medicines are outlined above.  Orders Placed This Encounter  Procedures  . DG Chest 2 View  . Comp Met (CMET)  . Pro b natriuretic peptide  . CBC  . EKG 12-Lead   Meds ordered this encounter  Medications  . metoprolol tartrate (LOPRESSOR) 50 MG tablet    Sig: Take 1 tablet (50 mg total) by mouth daily.    Dispense:  180 tablet    Refill:  3    DOSE INCREASE  . furosemide (LASIX) 20 MG tablet    Sig: Take 40 mg daily for 2 days then change to 20 mg daily    Dispense:  90 tablet    Refill:  3  . amiodarone (PACERONE) 200 MG tablet    Sig: Take 1 tablet (200 mg total) by mouth 2 (two) times daily.    Dispense:  90 tablet    Refill:  3    DECREASE IN DOSE    Patient Instructions  YOU HAVE BEEN SENT TO [facility]      Signed, Sinclair Grooms, MD  10/16/2017 6:07 PM    [facility]

## 2017-10-16 ENCOUNTER — Inpatient Hospital Stay (HOSPITAL_COMMUNITY)
Admission: EM | Admit: 2017-10-16 | Discharge: 2017-10-21 | DRG: 308 | Disposition: A | Discharge status: Home / Self Care | Payer: Medicare Other | Source: Ambulatory Visit | Attending: Interventional Cardiology | Admitting: Interventional Cardiology

## 2017-10-16 ENCOUNTER — Encounter (HOSPITAL_COMMUNITY): Payer: Self-pay | Admitting: Emergency Medicine

## 2017-10-16 ENCOUNTER — Other Ambulatory Visit: Payer: Self-pay

## 2017-10-16 ENCOUNTER — Encounter: Payer: Self-pay | Admitting: Interventional Cardiology

## 2017-10-16 ENCOUNTER — Ambulatory Visit (INDEPENDENT_AMBULATORY_CARE_PROVIDER_SITE_OTHER): Payer: Medicare Other | Admitting: Interventional Cardiology

## 2017-10-16 ENCOUNTER — Emergency Department (HOSPITAL_COMMUNITY): Payer: Medicare Other

## 2017-10-16 VITALS — BP 140/84 | HR 120 | Ht 65.0 in | Wt 178.0 lb

## 2017-10-16 DIAGNOSIS — K219 Gastro-esophageal reflux disease without esophagitis: Secondary | ICD-10-CM | POA: Diagnosis present

## 2017-10-16 DIAGNOSIS — Z9841 Cataract extraction status, right eye: Secondary | ICD-10-CM

## 2017-10-16 DIAGNOSIS — H353120 Nonexudative age-related macular degeneration, left eye, stage unspecified: Secondary | ICD-10-CM | POA: Diagnosis present

## 2017-10-16 DIAGNOSIS — Z9842 Cataract extraction status, left eye: Secondary | ICD-10-CM

## 2017-10-16 DIAGNOSIS — Z79891 Long term (current) use of opiate analgesic: Secondary | ICD-10-CM

## 2017-10-16 DIAGNOSIS — I4891 Unspecified atrial fibrillation: Secondary | ICD-10-CM | POA: Diagnosis not present

## 2017-10-16 DIAGNOSIS — J449 Chronic obstructive pulmonary disease, unspecified: Secondary | ICD-10-CM | POA: Diagnosis not present

## 2017-10-16 DIAGNOSIS — N179 Acute kidney failure, unspecified: Secondary | ICD-10-CM | POA: Diagnosis present

## 2017-10-16 DIAGNOSIS — Z905 Acquired absence of kidney: Secondary | ICD-10-CM

## 2017-10-16 DIAGNOSIS — N184 Chronic kidney disease, stage 4 (severe): Secondary | ICD-10-CM | POA: Diagnosis not present

## 2017-10-16 DIAGNOSIS — Z7901 Long term (current) use of anticoagulants: Secondary | ICD-10-CM

## 2017-10-16 DIAGNOSIS — E559 Vitamin D deficiency, unspecified: Secondary | ICD-10-CM | POA: Diagnosis present

## 2017-10-16 DIAGNOSIS — Z888 Allergy status to other drugs, medicaments and biological substances status: Secondary | ICD-10-CM

## 2017-10-16 DIAGNOSIS — Z79899 Other long term (current) drug therapy: Secondary | ICD-10-CM

## 2017-10-16 DIAGNOSIS — E7800 Pure hypercholesterolemia, unspecified: Secondary | ICD-10-CM | POA: Diagnosis present

## 2017-10-16 DIAGNOSIS — E538 Deficiency of other specified B group vitamins: Secondary | ICD-10-CM | POA: Diagnosis present

## 2017-10-16 DIAGNOSIS — I481 Persistent atrial fibrillation: Secondary | ICD-10-CM | POA: Diagnosis not present

## 2017-10-16 DIAGNOSIS — Z7982 Long term (current) use of aspirin: Secondary | ICD-10-CM

## 2017-10-16 DIAGNOSIS — L97518 Non-pressure chronic ulcer of other part of right foot with other specified severity: Secondary | ICD-10-CM | POA: Diagnosis not present

## 2017-10-16 DIAGNOSIS — R0602 Shortness of breath: Secondary | ICD-10-CM | POA: Diagnosis not present

## 2017-10-16 DIAGNOSIS — Z955 Presence of coronary angioplasty implant and graft: Secondary | ICD-10-CM | POA: Diagnosis not present

## 2017-10-16 DIAGNOSIS — I495 Sick sinus syndrome: Secondary | ICD-10-CM | POA: Diagnosis not present

## 2017-10-16 DIAGNOSIS — I252 Old myocardial infarction: Secondary | ICD-10-CM | POA: Diagnosis not present

## 2017-10-16 DIAGNOSIS — I447 Left bundle-branch block, unspecified: Secondary | ICD-10-CM | POA: Diagnosis present

## 2017-10-16 DIAGNOSIS — Z950 Presence of cardiac pacemaker: Secondary | ICD-10-CM

## 2017-10-16 DIAGNOSIS — Z885 Allergy status to narcotic agent status: Secondary | ICD-10-CM

## 2017-10-16 DIAGNOSIS — I5033 Acute on chronic diastolic (congestive) heart failure: Secondary | ICD-10-CM | POA: Diagnosis not present

## 2017-10-16 DIAGNOSIS — L97328 Non-pressure chronic ulcer of left ankle with other specified severity: Secondary | ICD-10-CM | POA: Diagnosis not present

## 2017-10-16 DIAGNOSIS — I5032 Chronic diastolic (congestive) heart failure: Secondary | ICD-10-CM

## 2017-10-16 DIAGNOSIS — K5900 Constipation, unspecified: Secondary | ICD-10-CM | POA: Diagnosis present

## 2017-10-16 DIAGNOSIS — Z96653 Presence of artificial knee joint, bilateral: Secondary | ICD-10-CM | POA: Diagnosis present

## 2017-10-16 DIAGNOSIS — I509 Heart failure, unspecified: Secondary | ICD-10-CM

## 2017-10-16 DIAGNOSIS — I4819 Other persistent atrial fibrillation: Secondary | ICD-10-CM

## 2017-10-16 DIAGNOSIS — Z7951 Long term (current) use of inhaled steroids: Secondary | ICD-10-CM

## 2017-10-16 DIAGNOSIS — I2510 Atherosclerotic heart disease of native coronary artery without angina pectoris: Secondary | ICD-10-CM | POA: Diagnosis present

## 2017-10-16 DIAGNOSIS — I361 Nonrheumatic tricuspid (valve) insufficiency: Secondary | ICD-10-CM | POA: Diagnosis not present

## 2017-10-16 DIAGNOSIS — I480 Paroxysmal atrial fibrillation: Secondary | ICD-10-CM | POA: Diagnosis not present

## 2017-10-16 DIAGNOSIS — I130 Hypertensive heart and chronic kidney disease with heart failure and stage 1 through stage 4 chronic kidney disease, or unspecified chronic kidney disease: Secondary | ICD-10-CM | POA: Diagnosis present

## 2017-10-16 DIAGNOSIS — Z87891 Personal history of nicotine dependence: Secondary | ICD-10-CM

## 2017-10-16 DIAGNOSIS — Z8249 Family history of ischemic heart disease and other diseases of the circulatory system: Secondary | ICD-10-CM

## 2017-10-16 DIAGNOSIS — Z9049 Acquired absence of other specified parts of digestive tract: Secondary | ICD-10-CM

## 2017-10-16 DIAGNOSIS — Z86010 Personal history of colonic polyps: Secondary | ICD-10-CM

## 2017-10-16 DIAGNOSIS — J9801 Acute bronchospasm: Secondary | ICD-10-CM | POA: Diagnosis present

## 2017-10-16 DIAGNOSIS — Z818 Family history of other mental and behavioral disorders: Secondary | ICD-10-CM

## 2017-10-16 DIAGNOSIS — H353210 Exudative age-related macular degeneration, right eye, stage unspecified: Secondary | ICD-10-CM | POA: Diagnosis present

## 2017-10-16 DIAGNOSIS — I110 Hypertensive heart disease with heart failure: Secondary | ICD-10-CM | POA: Diagnosis not present

## 2017-10-16 DIAGNOSIS — I872 Venous insufficiency (chronic) (peripheral): Secondary | ICD-10-CM | POA: Diagnosis present

## 2017-10-16 DIAGNOSIS — Z9089 Acquired absence of other organs: Secondary | ICD-10-CM

## 2017-10-16 HISTORY — DX: Acute on chronic diastolic (congestive) heart failure: I50.33

## 2017-10-16 LAB — CBC
HEMATOCRIT: 36.9 % (ref 36.0–46.0)
HEMOGLOBIN: 11 g/dL — AB (ref 12.0–15.0)
MCH: 29.5 pg (ref 26.0–34.0)
MCHC: 29.8 g/dL — ABNORMAL LOW (ref 30.0–36.0)
MCV: 98.9 fL (ref 78.0–100.0)
Platelets: 209 10*3/uL (ref 150–400)
RBC: 3.73 MIL/uL — ABNORMAL LOW (ref 3.87–5.11)
RDW: 15.5 % (ref 11.5–15.5)
WBC: 8.6 10*3/uL (ref 4.0–10.5)

## 2017-10-16 LAB — CBC WITH DIFFERENTIAL/PLATELET
Abs Immature Granulocytes: 0 10*3/uL (ref 0.0–0.1)
Basophils Absolute: 0.1 10*3/uL (ref 0.0–0.1)
Basophils Relative: 1 %
EOS ABS: 0.2 10*3/uL (ref 0.0–0.7)
EOS PCT: 2 %
HEMATOCRIT: 35.1 % — AB (ref 36.0–46.0)
Hemoglobin: 10.6 g/dL — ABNORMAL LOW (ref 12.0–15.0)
Immature Granulocytes: 0 %
LYMPHS ABS: 1.3 10*3/uL (ref 0.7–4.0)
LYMPHS PCT: 15 %
MCH: 30.1 pg (ref 26.0–34.0)
MCHC: 30.2 g/dL (ref 30.0–36.0)
MCV: 99.7 fL (ref 78.0–100.0)
MONOS PCT: 11 %
Monocytes Absolute: 0.9 10*3/uL (ref 0.1–1.0)
Neutro Abs: 5.8 10*3/uL (ref 1.7–7.7)
Neutrophils Relative %: 71 %
Platelets: 198 10*3/uL (ref 150–400)
RBC: 3.52 MIL/uL — AB (ref 3.87–5.11)
RDW: 15.5 % (ref 11.5–15.5)
WBC: 8.3 10*3/uL (ref 4.0–10.5)

## 2017-10-16 LAB — BASIC METABOLIC PANEL
ANION GAP: 10 (ref 5–15)
BUN: 24 mg/dL — ABNORMAL HIGH (ref 8–23)
CHLORIDE: 106 mmol/L (ref 98–111)
CO2: 26 mmol/L (ref 22–32)
Calcium: 9 mg/dL (ref 8.9–10.3)
Creatinine, Ser: 1.6 mg/dL — ABNORMAL HIGH (ref 0.44–1.00)
GFR calc Af Amer: 31 mL/min — ABNORMAL LOW (ref >60)
GFR, EST NON AFRICAN AMERICAN: 27 mL/min — AB (ref >60)
GLUCOSE: 97 mg/dL (ref 70–99)
POTASSIUM: 4.3 mmol/L (ref 3.5–5.1)
Sodium: 142 mmol/L (ref 135–145)

## 2017-10-16 LAB — COMPREHENSIVE METABOLIC PANEL
ALBUMIN: 3.3 g/dL — AB (ref 3.5–5.0)
ALT: 11 U/L (ref 0–44)
AST: 15 U/L (ref 15–41)
Alkaline Phosphatase: 60 U/L (ref 38–126)
Anion gap: 10 (ref 5–15)
BILIRUBIN TOTAL: 1 mg/dL (ref 0.3–1.2)
BUN: 24 mg/dL — AB (ref 8–23)
CALCIUM: 8.8 mg/dL — AB (ref 8.9–10.3)
CO2: 24 mmol/L (ref 22–32)
Chloride: 109 mmol/L (ref 98–111)
Creatinine, Ser: 1.58 mg/dL — ABNORMAL HIGH (ref 0.44–1.00)
GFR calc Af Amer: 32 mL/min — ABNORMAL LOW (ref >60)
GFR, EST NON AFRICAN AMERICAN: 27 mL/min — AB (ref >60)
GLUCOSE: 96 mg/dL (ref 70–99)
Potassium: 4.4 mmol/L (ref 3.5–5.1)
Sodium: 143 mmol/L (ref 135–145)
TOTAL PROTEIN: 5.9 g/dL — AB (ref 6.5–8.1)

## 2017-10-16 LAB — PROTIME-INR
INR: 1.14
Prothrombin Time: 14.5 seconds (ref 11.4–15.2)

## 2017-10-16 LAB — I-STAT TROPONIN, ED: Troponin i, poc: 0.02 ng/mL (ref 0.00–0.08)

## 2017-10-16 LAB — BRAIN NATRIURETIC PEPTIDE: B Natriuretic Peptide: 813.7 pg/mL — ABNORMAL HIGH (ref 0.0–100.0)

## 2017-10-16 LAB — MAGNESIUM: Magnesium: 1.9 mg/dL (ref 1.7–2.4)

## 2017-10-16 LAB — TSH: TSH: 1.374 u[IU]/mL (ref 0.350–4.500)

## 2017-10-16 MED ORDER — BUDESONIDE 0.5 MG/2ML IN SUSP
0.5000 mg | Freq: Two times a day (BID) | RESPIRATORY_TRACT | Status: DC
  Administered 2017-10-16: 0.5 mg via RESPIRATORY_TRACT
  Filled 2017-10-16: qty 2

## 2017-10-16 MED ORDER — METOPROLOL SUCCINATE ER 25 MG PO TB24
25.0000 mg | ORAL_TABLET | Freq: Two times a day (BID) | ORAL | Status: DC
  Administered 2017-10-17 – 2017-10-21 (×9): 25 mg via ORAL
  Filled 2017-10-16 (×9): qty 1

## 2017-10-16 MED ORDER — ONDANSETRON HCL 4 MG/2ML IJ SOLN: 4.0000 mg | Freq: Four times a day (QID) | INTRAMUSCULAR | Status: DC | PRN

## 2017-10-16 MED ORDER — CALCIUM CARBONATE 1250 (500 CA) MG PO TABS
500.0000 mg | ORAL_TABLET | Freq: Two times a day (BID) | ORAL | Status: DC
  Administered 2017-10-17 – 2017-10-21 (×9): 500 mg via ORAL
  Filled 2017-10-16 (×9): qty 1

## 2017-10-16 MED ORDER — AMIODARONE HCL 200 MG PO TABS
200.0000 mg | ORAL_TABLET | Freq: Two times a day (BID) | ORAL | Status: DC
  Administered 2017-10-16 – 2017-10-21 (×10): 200 mg via ORAL
  Filled 2017-10-16 (×11): qty 1

## 2017-10-16 MED ORDER — SODIUM CHLORIDE 0.9 % IV SOLN: 250.0000 mL | INTRAVENOUS | Status: DC | PRN

## 2017-10-16 MED ORDER — TRAMADOL HCL 50 MG PO TABS: 50.0000 mg | ORAL_TABLET | Freq: Four times a day (QID) | ORAL | Status: DC | PRN

## 2017-10-16 MED ORDER — ALBUTEROL SULFATE (2.5 MG/3ML) 0.083% IN NEBU: 2.5000 mg | INHALATION_SOLUTION | RESPIRATORY_TRACT | Status: DC | PRN

## 2017-10-16 MED ORDER — POTASSIUM CHLORIDE CRYS ER 20 MEQ PO TBCR
20.0000 meq | EXTENDED_RELEASE_TABLET | Freq: Every day | ORAL | Status: DC
  Administered 2017-10-16 – 2017-10-19 (×4): 20 meq via ORAL
  Filled 2017-10-16 (×4): qty 1

## 2017-10-16 MED ORDER — IPRATROPIUM-ALBUTEROL 0.5-2.5 (3) MG/3ML IN SOLN
3.0000 mL | Freq: Four times a day (QID) | RESPIRATORY_TRACT | Status: DC | PRN
  Filled 2017-10-16: qty 3

## 2017-10-16 MED ORDER — AMIODARONE HCL 200 MG PO TABS: 200.0000 mg | ORAL_TABLET | Freq: Two times a day (BID) | ORAL | 3 refills | Status: DC

## 2017-10-16 MED ORDER — NITROGLYCERIN 0.4 MG SL SUBL: 0.4000 mg | SUBLINGUAL_TABLET | SUBLINGUAL | Status: DC | PRN

## 2017-10-16 MED ORDER — DOCUSATE SODIUM 100 MG PO CAPS
100.0000 mg | ORAL_CAPSULE | Freq: Every day | ORAL | Status: DC
  Administered 2017-10-16 – 2017-10-21 (×6): 100 mg via ORAL
  Filled 2017-10-16 (×6): qty 1

## 2017-10-16 MED ORDER — ALPRAZOLAM 0.25 MG PO TABS: 0.2500 mg | ORAL_TABLET | Freq: Two times a day (BID) | ORAL | Status: DC | PRN

## 2017-10-16 MED ORDER — APIXABAN 2.5 MG PO TABS
2.5000 mg | ORAL_TABLET | Freq: Two times a day (BID) | ORAL | Status: DC
  Administered 2017-10-17 – 2017-10-21 (×9): 2.5 mg via ORAL
  Filled 2017-10-16 (×9): qty 1

## 2017-10-16 MED ORDER — FLUTICASONE PROPIONATE 50 MCG/ACT NA SUSP
1.0000 | Freq: Every day | NASAL | Status: DC
  Administered 2017-10-17 – 2017-10-21 (×5): 1 via NASAL
  Filled 2017-10-16: qty 16

## 2017-10-16 MED ORDER — ATORVASTATIN CALCIUM 20 MG PO TABS
20.0000 mg | ORAL_TABLET | Freq: Every day | ORAL | Status: DC
  Administered 2017-10-16 – 2017-10-21 (×6): 20 mg via ORAL
  Filled 2017-10-16 (×7): qty 1

## 2017-10-16 MED ORDER — SODIUM CHLORIDE 0.9% FLUSH
3.0000 mL | Freq: Two times a day (BID) | INTRAVENOUS | Status: DC
  Administered 2017-10-17 – 2017-10-21 (×9): 3 mL via INTRAVENOUS

## 2017-10-16 MED ORDER — METOPROLOL TARTRATE 50 MG PO TABS: 50.0000 mg | ORAL_TABLET | Freq: Every day | ORAL | 3 refills | Status: DC

## 2017-10-16 MED ORDER — SODIUM CHLORIDE 0.9% FLUSH: 3.0000 mL | INTRAVENOUS | Status: DC | PRN

## 2017-10-16 MED ORDER — ACETAMINOPHEN 325 MG PO TABS
650.0000 mg | ORAL_TABLET | ORAL | Status: DC | PRN
  Administered 2017-10-16 – 2017-10-21 (×2): 650 mg via ORAL
  Filled 2017-10-16 (×2): qty 2

## 2017-10-16 MED ORDER — PANTOPRAZOLE SODIUM 40 MG PO TBEC
40.0000 mg | DELAYED_RELEASE_TABLET | Freq: Every day | ORAL | Status: DC
  Administered 2017-10-17 – 2017-10-21 (×5): 40 mg via ORAL
  Filled 2017-10-16 (×6): qty 1

## 2017-10-16 MED ORDER — FUROSEMIDE 20 MG PO TABS: ORAL_TABLET | ORAL | 3 refills | Status: DC

## 2017-10-16 MED ORDER — FUROSEMIDE 10 MG/ML IJ SOLN
40.0000 mg | Freq: Two times a day (BID) | INTRAMUSCULAR | Status: AC
  Administered 2017-10-16 – 2017-10-17 (×2): 40 mg via INTRAVENOUS
  Filled 2017-10-16 (×2): qty 4

## 2017-10-16 MED ORDER — VITAMIN D 1000 UNITS PO TABS
1000.0000 [IU] | ORAL_TABLET | Freq: Every day | ORAL | Status: DC
  Administered 2017-10-17 – 2017-10-21 (×5): 1000 [IU] via ORAL
  Filled 2017-10-16 (×5): qty 1

## 2017-10-16 NOTE — ED Notes (Signed)
Called pt no answer °

## 2017-10-16 NOTE — Patient Instructions (Signed)
YOU HAVE BEEN SENT TO [facility]

## 2017-10-16 NOTE — H&P (Addendum)
Cardiology ADMISSION H and P   Date:  10/16/2017  Patient ID: [name]; MRN: [id number]; DOB: 1923-04-17   Admission date: 10/16/2017  Primary Care Provider: McDiarmid, Blane Ohara, MD Primary Cardiologist: No primary care provider on file.  [place] Primary Electrophysiologist:  Thompson Grayer, the  Chief Complaint: Progressive dyspnea and wheezing for 2 weeks  Patient Profile:   [name] is a 84 y.o. female with a history of CAD, chronic diastolic heart failure, stage III chronic kidney disease, sick sinus syndrome with paroxysmal atrial fibrillation and bradycardia, permanent pacemaker therapy, chronic therapy with amiodarone, and COPD.  She gives a 2-week history of progressive lower extremity swelling and dyspnea.   History of Present Illness:    [name] is a 84 y.o. female with a hx of coronary stent, paroxysmal atrial fibrillation but rhythm control on amiodarone therapy, chronic anticoagulation therapy, chronic diastolic heart failure, hypertension, and pacemaker for tachybradycardia syndrome.  She has noticed lower extremity swelling, progressive dyspnea on exertion, cough, and weakness over the past 2 weeks.  The cough and shortness of breath have been intermittent.  She denies chest pain.  She have a case compliance with her current medical regimen.  She has not needed nitroglycerin.  She sleeps on 2 pillows which is not changed.  She has not had syncope.  Past Medical History:  Diagnosis Date  . Abnormal mammogram, unspecified 08/23/2010   Followup imaging reassuring.   . Acute appendicitis with rupture   . ADENOMATOUS COLONIC POLYP 03/01/2003   Qualifier: Diagnosis of  By: McDiarmid MD, Sherren Mocha Multiple benign polyps of cecum, ascending, transverse and sigmoid colon by 8/09 colonoscopy by Dr Cristina Gong  Colonoscopy by Dr Cristina Gong for iron-deficiency anemia on 04/27/2010 showed three sessile polyps that were in ascending (3 mm x 9 mm), transverse (4 mm), and cecum (3 mm).  All  three were tubular adenomas that were negative for high grade dysplasia or malignancy on pathology. Dr Cristina Gong called the polpys benign and not requiring follow-up in view of the patients age.    . Adrenal adenoma    Incidentaloma  . AF (paroxysmal atrial fibrillation) ([place]) 11/11/2010   Hospitalization (9/8-9/10, Dr Daneen Schick, III, Cardiology) for Paroxysmal Atrial Fibrillation with RVR and anginal pain secondary to demand/supply mismatch in setting of RVR with known circumflex artery branch disease.    Marland Kitchen ANXIETY 04/27/2006   Qualifier: Diagnosis of  By: McDiarmid MD, Sherren Mocha    . Benign essential tremor 02/04/2016  . Candidiasis of the esophagus 11/26/2007  . CAP (community acquired pneumonia) 02/25/2015  . Cataract 2013   Bilateral   . Cellulitis of leg, right 10/01/2012  . Cellulitis of right lower extremity   . Chest pain with moderate risk of acute coronary syndrome 05/21/2015  . Chronic kidney disease (CKD), stage III (moderate) (HCC)   . Concussion with loss of consciousness   . COPD 04/27/2006   Qualifier: Diagnosis of  By: McDiarmid MD, Sherren Mocha    . COPD exacerbation ([place])   . COPD, severe   . Decreased functional mobility and endurance 03/17/2015  . Diastolic heart failure ([place]) 07/30/2015  . DISC WITH RADICULOPATHY 04/27/2006   Qualifier: Diagnosis of  By: McDiarmid MD, Sherren Mocha    . Dyspnea   . EDEMA-LEGS,DUE TO VENOUS OBSTRUCT. 04/27/2006   Qualifier: Diagnosis of  By: McDiarmid MD, Sherren Mocha    . 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 Normal uric acid serum level at time of attack.    Marland Kitchen HERNIA, HIATAL, NONCONGENITAL 04/27/2006   Qualifier: Diagnosis of  By: McDiarmid MD, Sherren Mocha    . High risk medications (not anticoagulants) long-term use 03/05/2012  . History of Hemorrhoids 04/27/2006   Qualifier: Diagnosis of  By: McDiarmid MD, Sherren Mocha    . History of iron deficiency 01/16/2015  . Hx of colonoscopy with polypectomy  04/27/2010   Dr Cristina Gong found three  tubular adenomas each less than 10 mm size  . Hypertension   . Hypotension 07/09/2015  . Iron deficiency anemia 08/05/2010   Dr Cristina Gong (GI) has evaluated with EGD, colonoscopy, and video capsular endoscopy in 2011 & 2012.  All have been unrevealing as to an origin of IDA.  OV with Dr Cristina Gong (10/28/10) assessment of blood in stool per hemoccult and GER. Hbg 12.1 g/dL, MCV 91.8, Ferritin 30 ng/mL. Patient taking on ferrous sulfate tab daily.   EGD on 03/06/12 by Dr Cristina Gong for IDA non-obstructing Schatzki's ring at Gastroesophageal junction, otherwise normal esophagus and stomach.     . Leg cramps 05/29/2012  . Lumbar herniated disc    History of HNP L4/5 in 2003  . Macular degeneration, bilateral 10/04/2010   Right eye is wet MD, the other is dry macular degeneration (ARMD). Pt undergoing some form of vascular endothelial growth factor inhibition intraocular therapy.    . Mild cognitive impairment 10/26/2012   (10/25/12) Failed MiniCog screen  . MUSCLE CRAMPS 03/11/2010   Qualifier: Diagnosis of  By: McDiarmid MD, Sherren Mocha    . Muscle spasm of back 09/24/2013  . Myocardial infarct, old   . Nocturia 10/28/2016  . Numbness and tingling in hands 07/21/2011  . Pacemaker    MDT  . PREDIABETES 09/11/2007   Qualifier: Diagnosis of  By: McDiarmid MD, Sherren Mocha    . Retinal hemorrhage of left eye 06/2010  . RHINITIS, ALLERGIC 04/27/2006   Qualifier: Diagnosis of  By: McDiarmid MD, Sherren Mocha    . SCHATZKI'S RING, HX OF 11/26/2007   Qualifier: Diagnosis of  By: McDiarmid MD, Sherren Mocha  An EGD was performed by Dr Cristina Gong on 04/27/2010 for iron deficiency anemia. There was a a transient hiatal hernia with Schatzki's ring. Stomach and duodenum were normal. EGD on 03/06/12 by Dr Cristina Gong for IDA non-obstructing Schatzki's ring at Gastroesophageal junction, otherwise normal esophagus and stomach.    . Seborrheic keratosis, right anterior thigh 12/12/2013  . Shoulder pain, left 01/09/2014  . Sick sinus  syndrome with tachycardia ([place])    MDT  . Soft tissue injury of foot 05/03/2011  . Solar lentigo 06/15/2012  . Solitary kidney, acquired 05/17/2010   Surgical removal for transitional cell cancer by Tresa Endo, MD ([place]). Surveillance cystoscopy by Dr Alinda Money [facility]) on 10/19/12 without evidence of cystoscopic recurrence. Recommend RTC one year for cystoscopy.   Marland Kitchen Spinal stenosis, lumbar   . Transitional cell carcinoma of ureter, history   . Urge incontinence 12/13/2011   Diagnosed in 10/2011 by Dr Bjorn Loser (Urology)   . VENTRICULAR HYPERTROPHY, LEFT 08/28/2008   Qualifier: Diagnosis of  By: McDiarmid MD, Sherren Mocha    . VITAMIN B12 DEFICIENCY 10/07/2009   Qualifier: Diagnosis of  By: McDiarmid MD, Sherren Mocha  Dx based on a post-TKR anemia work-up Low normal serum B12 with high Methylmalonic acid and homocysteine level  Vit B12 serum level (10/28/10) > 1500 pg/mL   . Vitamin D deficiency 11/02/2010   Serum vitamin D 25(OH) = 10.9 ng/mL (30 -  100) on 10/28/10 c/w Vitamin D deficiency.       Past Surgical History:  Procedure Laterality Date  . BREAST SURGERY     breast reduction  . CHOLECYSTECTOMY    . CORONARY ANGIOPLASTY WITH STENT PLACEMENT  2010   BMS RCA, OM2 occluded  . CYSTOSCOPY  09/30/2015   No cystoscopic evidence of uroepithelial neoplasm.  Follow up surveillance cystoscopy in one year  . DUPUYTREN CONTRACTURE RELEASE Right 05/22/2014   Procedure: DUPUYTREN RELEASE AND REPAIR AS NECESSARY RIGHT RING FINGER AND MIDDLE FINGER;  Surgeon: Roseanne Kaufman, MD;  Location: [facility];  Service: Orthopedics;  Laterality: Right;  . ESOPHAGOGASTRODUODENOSCOPY  04/27/2010   Dr Cristina Gong - found transient H/H & Schatzki's ring  . ESOPHAGOGASTRODUODENOSCOPY ENDOSCOPY  03/06/2012   Dr Cristina Gong - found non-obstuctive Schatzki's ring at [employer] [facility]. o/w normal EGD.   Marland Kitchen EYE SURGERY     bilateral cataracts  . KNEE ARTHROSCOPY W/ SYNOVECTOMY  11/2009, left knee   Dr Wynelle Link  . NEPHRECTOMY  For transition cell  cancer    Dr Tresa Endo, surgeon  . PACEMAKER GENERATOR CHANGE N/A 11/12/2012   Procedure: PACEMAKER GENERATOR CHANGE;  Surgeon: Evans Lance, MD; [employer] [facility] dual-chamber pacemaker serial number [id number]; Laterality: Right  . PACEMAKER INSERTION  2005   Dr Doreatha Lew  . PACEMAKER LEAD REMOVAL  2005   Removal and reinsertion of atrial and ventricular leads due to migration  . REPLACEMENT TOTAL KNEE  2009, Right knee   Dr Wynelle Link  . REPLACEMENT TOTAL KNEE  05/2009, Left knee   Dr Wynelle Link  . TONSILLECTOMY    . TRIGGER FINGER RELEASE Right 05/22/2014   Procedure: RIGHT HAND A-1 PULLEY RELEASE ;  Surgeon: Roseanne Kaufman, MD;  Location: [facility];  Service: Orthopedics;  Laterality: Right;    Current Medications: Current Meds  Medication Sig  . aspirin 81 MG chewable tablet Chew 81 mg by mouth daily.  Marland Kitchen atorvastatin (LIPITOR) 20 MG tablet TAKE 1 TABLET BY MOUTH DAILY  . budesonide (PULMICORT) 0.5 MG/2ML nebulizer solution Take 2 mLs (0.5 mg total) by nebulization 2 (two) times daily.  . calcium carbonate (OSCAL) 1500 (600 Ca) MG TABS tablet Take 600 mg of elemental calcium by mouth 2 (two) times daily with a meal.  . cephALEXin (KEFLEX) 500 MG capsule Take 500 mg by mouth 2 (two) times daily.  . Cholecalciferol 1000 units tablet Take 1 tablet by mouth daily.   Marland Kitchen docusate sodium (COLACE) 100 MG capsule Take 100 mg by mouth daily.  Marland Kitchen ELIQUIS 2.5 MG TABS tablet TAKE 1 TABLET BY MOUTH TWICE DAILY  . fluticasone (FLONASE) 50 MCG/ACT nasal spray SHAKE LIQUID AND USE 2 SPRAYS IN EACH NOSTRIL DAILY  . ipratropium-albuterol (DUONEB) 0.5-2.5 (3) MG/3ML SOLN USE 3 ML VIA NEBULIZER EVERY 4 HOURS  . metoprolol succinate (TOPROL-XL) 25 MG 24 hr tablet TAKE 1 TABLET(25 MG) BY MOUTH DAILY  . nitroGLYCERIN (NITROSTAT) 0.4 MG SL tablet Place 1 tablet (0.4 mg total) under the tongue every 5 (five) minutes as needed for chest pain (x 3 tabs).  Marland Kitchen omeprazole (PRILOSEC) 20 MG capsule Take 1 capsule (20 mg  total) by mouth daily.  . traMADol (ULTRAM) 50 MG tablet TAKE 1 TABLET BY MOUTH EVERY 6 HOURS AS NEEDED FOR PAIN  . vitamin B-12 (CYANOCOBALAMIN) 1000 MCG tablet Take 1 tablet (1,000 mcg total) by mouth daily.  . [DISCONTINUED] amiodarone (PACERONE) 200 MG tablet TAKE 1 TABLET(200 MG) BY MOUTH DAILY  . [DISCONTINUED] furosemide (LASIX) 40 MG  tablet Take 20 mg by mouth daily as needed.     Allergies:   Baclofen; Codeine phosphate; Hydrochlorothiazide; Montelukast sodium; and Norco [hydrocodone-acetaminophen]   Social History   Socioeconomic History  . Marital status: Widowed    Spouse name: Not on file  . Number of children: 4  . Years of education: Not on file  . Highest education level: Not on file  Occupational History  . 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 Financial resource strain: Not on file  . Food insecurity:    Worry: Not on file    Inability: Not on file  . Transportation needs:    Medical: Not on file    Non-medical: Not on file  Tobacco Use  . Smoking status: Former Smoker    Packs/day: 1.00    Types: Cigarettes    Last attempt to quit: 03/02/2009    Years since quitting: 8.6  . Smokeless tobacco: Never Used  Substance and Sexual Activity  . Alcohol use: Yes    Alcohol/week: 13.0 standard drinks    Types: 7 Glasses of wine, 6 Standard drinks or equivalent per week    Comment: 5 oz white wine per day  . Drug use: No  . Sexual activity: Never  Lifestyle  . Physical activity:    Days per week: Not on file    Minutes per session: Not on file  . Stress: Not on file  Relationships  . Social connections:    Talks on phone: Not on file    Gets together: Not on file    Attends religious service: Not on file    Active member of club or organization: Not on file    Attends meetings of clubs or organizations: Not on file    Relationship status: Not on file  Other Topics Concern  . 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    Semi-retired Futures trader.     Pt has home nebulizer for inhalation therapies.   Former Smoker   Smoking Status:  quit > 5 years ago      (+) DNR status per discussion with Dr McDiarmid 10/25/12 and reiterated 06/20/13 office visit .   (+) Living Will/Advance Directive   (+) HC-POA: Cecile Sheerer Causey (pt's dgt)      Current Social History 09/22/2016        Patient lives with daughter, [name] (flight attendant), in two level home 09/22/2016   Transportation: Patient has own vehicle 09/22/2016   Important Relationships 4 daughters and 91 yo grandson 09/22/2016    Pets: None 09/22/2016   Education / Work:  College/ Retired Futures trader 09/22/2016   Interests / Fun: Reads 2 books per week, watches TV, entertains 09/22/2016   Current Stressors: 84 yo house in need of outside repairs 09/22/2016   Religious / Personal Beliefs: Joanie Coddington 09/22/2016   Other: Had a wonderful marriage/ Very thankful/ wants to continue traveling/ closest friends are all gone 09/22/2016   L. Silvano Rusk, RN, BSN  Family History: The patient's family history includes Dementia in her mother; Heart attack in her father; Heart disease in her father.  ROS:   Please see the history of present illness.    , Denies chills and fever.  Orthopnea, 2 pillow.  Significant lower extremity swelling slowly progressive.  Exertional fatigue.  Constipation.  All other systems reviewed and are negative.  EKGs/Labs/Other Studies Reviewed:    The following studies were reviewed today: None  EKG:  EKG is  ordered today.  The ekg ordered today demonstrates atrial fib with rapid ventricular response.  Left bundle branch block.  Recent  Labs: 08/29/2017: B Natriuretic Peptide 319.1; Hemoglobin 12.2; Platelets 227 09/21/2017: ALT 15; BUN 36; Creatinine, Ser 1.79; Potassium 4.6; Sodium 146; TSH 1.840  Recent Lipid Panel    Component Value Date/Time   CHOL 130 08/11/2016 1101   TRIG 106 08/11/2016 1101   HDL 37 (L) 08/11/2016 1101   CHOLHDL 3.5 08/11/2016 1101   CHOLHDL 3.4 07/30/2015 1231   VLDL 29 07/30/2015 1231   LDLCALC 72 08/11/2016 1101   LDLDIRECT 74 05/08/2014 1110    Physical Exam:    VS:  BP 140/84   Pulse (!) 120   Ht 5\' 5"  (1.651 m)   Wt 178 lb (80.7 kg)   BMI 29.62 kg/m     Wt Readings from Last 3 Encounters:  10/16/17 178 lb (80.7 kg)  10/03/17 173 lb (78.5 kg)  09/21/17 175 lb (79.4 kg)     GEN: Elderly and frail.  Well nourished, well developed in no acute distress. HEENT: Normal NECK there is moderate JVD. LYMPHATICS: No lymphadenopathy CARDIAC: IIRR, 2/6 systolic murmur, no gallop, 2-3 + edema bilateral lower extremities from the ankles to the knee.Marland Kitchen VASCULAR: 2+ pulses. no bruits. RESPIRATORY:  Clear to auscultation without rales, wheezing or rhonchi  ABDOMEN: Soft, non-tender, non-distended, No pulsatile mass, MUSCULOSKELETAL: No deformity  SKIN: Warm and dry NEUROLOGIC:  Alert and oriented x 3 PSYCHIATRIC:  Normal affect   ASSESSMENT:    1. Atrial fibrillation with RVR ([place])   2. Acute on chronic diastolic heart failure ([place])   3. Chronic anticoagulation   4. Pacemaker   5. On amiodarone therapy   6. COPD, severe ([place])   7. Chronic kidney disease (CKD), stage IV (severe) ([place])   8. Chronic diastolic CHF (congestive heart failure) ([place])   9. SICK SINUS SYNDROME    PLAN:    In order of problems listed above:  1. Atrial fibrillation with rapid ventricular response.  The tachycardia and rhythm change has not been recognized by the patient and therefore I have no indication of how long it is been present.  Progressive shortness of breath and lower extremity swelling has been  occurring over the past 2 weeks.  Will increase metoprolol dose from 25 mg XL to 50 mg daily.  Increase amiodarone to 200 mg twice daily.  If we achieve rate control but she does not convert, consider early electrical cardioversion since she has been on continuous anticoagulation therapy. 2. Known to have normal LV systolic function.  Currently in acute on chronic diastolic heart failure likely secondary to atrial fibrillation.  IV furosemide 40 mg every 12 hours x 2 doses.  Additional diuretic therapy should be ordered by treating team. 3. On apixaban 2.5 mg twice daily due to age and decreased kidney function.  She advocates compliance with therapy and no missed doses.  Apixaban will be continued. 4. Permanent pacemaker for sick sinus syndrome/tachybradycardia syndrome.  5. We will increase amiodarone to 200 mg twice daily.  Patient is being admitted to the hospital from the office for rate control, IV diuresis, surveillance of already abnormal kidney function (one kidney), and cardioversion if she does not convert with rate control.   Severity of Illness: The appropriate patient status for this patient is INPATIENT. Inpatient status is judged to be reasonable and necessary in order to provide the required intensity of service to ensure the patient's safety. The patient's presenting symptoms, physical exam findings, and initial radiographic and laboratory data in the context of their chronic comorbidities is felt to place them at high risk for further clinical deterioration. Furthermore, it is not anticipated that the patient will be medically stable for discharge from the hospital within 2 midnights of admission. The following factors support the patient status of inpatient.   " The patient's presenting symptoms include dyspnea and swelling. " The worrisome physical exam findings include peripheral edema. " The initial radiographic and laboratory data are worrisome because of CHF. " The chronic  co-morbidities include chronic kidney disease, coronary artery disease, COPD.   * I certify that at the point of admission it is my clinical judgment that the patient will require inpatient hospital care spanning beyond 2 midnights from the point of admission due to high intensity of service, high risk for further deterioration and high frequency of surveillance required.*    For questions or updates, please contact [facility] Please consult [email or web address] for contact info under Cardiology/STEMI.    Signed, Sinclair Grooms, MD  10/16/2017 6:16 PM

## 2017-10-16 NOTE — ED Provider Notes (Signed)
[facility] EMERGENCY DEPARTMENT Provider Note   CSN: [id number] Arrival date & time: 10/16/17  1742     History   Chief Complaint Chief Complaint  Patient presents with  . Atrial Fibrillation    HPI [name] is a 84 y.o. female.  Patient is a 84 year old female with history of atrial fibrillation, heart failure, CKD who presents from cardiology clinic with concern for volume overload and intermittent atrial fibrillation with RVR.  Cardiology would like to admit the patient for IV diuresis and medication titration for atrial fibrillation as patient had A. fib with RVR in clinic that has now resolved upon arrival to the ED.  Patient currently chest pain-free, she denies any shortness of breath, abdominal pain.  She has noticed increasing swelling to her legs.  She is intermittently on Lasix secondary to heart failure however she always requires careful diuresis given her CKD.  The history is provided by the patient.  Illness  This is a chronic problem. The current episode started more than 1 week ago. The problem occurs daily. The problem has not changed since onset.Pertinent negatives include no chest pain, no abdominal pain, no headaches and no shortness of breath. Nothing aggravates the symptoms. Nothing relieves the symptoms. She has tried nothing for the symptoms. The treatment provided no relief.    Past Medical History:  Diagnosis Date  . Abnormal mammogram, unspecified 08/23/2010   Followup imaging reassuring.   . Acute appendicitis with rupture   . ADENOMATOUS COLONIC POLYP 03/01/2003   Qualifier: Diagnosis of  By: McDiarmid MD, Sherren Mocha Multiple benign polyps of cecum, ascending, transverse and sigmoid colon by 8/09 colonoscopy by Dr Cristina Gong  Colonoscopy by Dr Cristina Gong for iron-deficiency anemia on 04/27/2010 showed three sessile polyps that were in ascending (3 mm x 9 mm), transverse (4 mm), and cecum (3 mm).  All three were tubular adenomas that were negative  for high grade dysplasia or malignancy on pathology. Dr Cristina Gong called the polpys benign and not requiring follow-up in view of the patients age.    . Adrenal adenoma    Incidentaloma  . AF (paroxysmal atrial fibrillation) ([place]) 11/11/2010   Hospitalization (9/8-9/10, Dr Daneen Schick, III, Cardiology) for Paroxysmal Atrial Fibrillation with RVR and anginal pain secondary to demand/supply mismatch in setting of RVR with known circumflex artery branch disease.    Marland Kitchen ANXIETY 04/27/2006   Qualifier: Diagnosis of  By: McDiarmid MD, Sherren Mocha    . Benign essential tremor 02/04/2016  . Candidiasis of the esophagus 11/26/2007  . CAP (community acquired pneumonia) 02/25/2015  . Cataract 2013   Bilateral   . Cellulitis of leg, right 10/01/2012  . Cellulitis of right lower extremity   . Chest pain with moderate risk of acute coronary syndrome 05/21/2015  . Chronic kidney disease (CKD), stage III (moderate) (HCC)   . Concussion with loss of consciousness   . COPD 04/27/2006   Qualifier: Diagnosis of  By: McDiarmid MD, Sherren Mocha    . COPD exacerbation ([place])   . COPD, severe   . Decreased functional mobility and endurance 03/17/2015  . Diastolic heart failure ([place]) 07/30/2015  . DISC WITH RADICULOPATHY 04/27/2006   Qualifier: Diagnosis of  By: McDiarmid MD, Sherren Mocha    . Dyspnea   . EDEMA-LEGS,DUE TO VENOUS OBSTRUCT. 04/27/2006   Qualifier: Diagnosis of  By: McDiarmid MD, Sherren Mocha    . 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  Normal uric acid serum level at time of attack.    Marland Kitchen HERNIA, HIATAL, NONCONGENITAL 04/27/2006   Qualifier: Diagnosis of  By: McDiarmid MD, Sherren Mocha    . High risk medications (not anticoagulants) long-term use 03/05/2012  . History of Hemorrhoids 04/27/2006   Qualifier: Diagnosis of  By: McDiarmid MD, Sherren Mocha    . History of iron deficiency 01/16/2015  . Hx of colonoscopy with polypectomy 04/27/2010   Dr Cristina Gong found three  tubular  adenomas each less than 10 mm size  . Hypertension   . Hypotension 07/09/2015  . Iron deficiency anemia 08/05/2010   Dr Cristina Gong (GI) has evaluated with EGD, colonoscopy, and video capsular endoscopy in 2011 & 2012.  All have been unrevealing as to an origin of IDA.  OV with Dr Cristina Gong (10/28/10) assessment of blood in stool per hemoccult and GER. Hbg 12.1 g/dL, MCV 91.8, Ferritin 30 ng/mL. Patient taking on ferrous sulfate tab daily.   EGD on 03/06/12 by Dr Cristina Gong for IDA non-obstructing Schatzki's ring at Gastroesophageal junction, otherwise normal esophagus and stomach.     . Leg cramps 05/29/2012  . Lumbar herniated disc    History of HNP L4/5 in 2003  . Macular degeneration, bilateral 10/04/2010   Right eye is wet MD, the other is dry macular degeneration (ARMD). Pt undergoing some form of vascular endothelial growth factor inhibition intraocular therapy.    . Mild cognitive impairment 10/26/2012   (10/25/12) Failed MiniCog screen  . MUSCLE CRAMPS 03/11/2010   Qualifier: Diagnosis of  By: McDiarmid MD, Sherren Mocha    . Muscle spasm of back 09/24/2013  . Myocardial infarct, old   . Nocturia 10/28/2016  . Numbness and tingling in hands 07/21/2011  . Pacemaker    MDT  . PREDIABETES 09/11/2007   Qualifier: Diagnosis of  By: McDiarmid MD, Sherren Mocha    . Retinal hemorrhage of left eye 06/2010  . RHINITIS, ALLERGIC 04/27/2006   Qualifier: Diagnosis of  By: McDiarmid MD, Sherren Mocha    . SCHATZKI'S RING, HX OF 11/26/2007   Qualifier: Diagnosis of  By: McDiarmid MD, Sherren Mocha  An EGD was performed by Dr Cristina Gong on 04/27/2010 for iron deficiency anemia. There was a a transient hiatal hernia with Schatzki's ring. Stomach and duodenum were normal. EGD on 03/06/12 by Dr Cristina Gong for IDA non-obstructing Schatzki's ring at Gastroesophageal junction, otherwise normal esophagus and stomach.    . Seborrheic keratosis, right anterior thigh 12/12/2013  . Shoulder pain, left 01/09/2014  . Sick sinus syndrome with tachycardia ([place])    MDT  . Soft  tissue injury of foot 05/03/2011  . Solar lentigo 06/15/2012  . Solitary kidney, acquired 05/17/2010   Surgical removal for transitional cell cancer by Tresa Endo, MD ([place]). Surveillance cystoscopy by Dr Alinda Money [facility]) on 10/19/12 without evidence of cystoscopic recurrence. Recommend RTC one year for cystoscopy.   Marland Kitchen Spinal stenosis, lumbar   . Transitional cell carcinoma of ureter, history   . Urge incontinence 12/13/2011   Diagnosed in 10/2011 by Dr Bjorn Loser (Urology)   . VENTRICULAR HYPERTROPHY, LEFT 08/28/2008   Qualifier: Diagnosis of  By: McDiarmid MD, Sherren Mocha    . VITAMIN B12 DEFICIENCY 10/07/2009   Qualifier: Diagnosis of  By: McDiarmid MD, Sherren Mocha  Dx based on a post-TKR anemia work-up Low normal serum B12 with high Methylmalonic acid and homocysteine level  Vit B12 serum level (10/28/10) > 1500 pg/mL   . Vitamin D deficiency 11/02/2010   Serum vitamin D 25(OH) = 10.9 ng/mL (30 -100) on 10/28/10  c/w Vitamin D deficiency.       Patient Active Problem List   Diagnosis Date Noted  . Acute on chronic diastolic heart failure ([place]) 10/16/2017  . Venous insufficiency of both lower extremities 09/21/2017  . Impaired mobility and ADLs 09/01/2017  . Other fatigue 06/30/2017  . Bilateral leg edema 01/10/2017  . Fall 10/27/2016  . Chronic diastolic CHF (congestive heart failure) ([place]) 11/08/2015  . GERD (gastroesophageal reflux disease) 11/08/2015  . On amiodarone therapy 09/14/2015  . Paroxysmal atrial fibrillation (HCC)   . Pure hypercholesterolemia 07/31/2014  . CAD S/P RCA BMS, residual CFX disease 06/08/2014  . Pacemaker 04/02/2013  . Mild cognitive impairment 10/26/2012  . Chronic anticoagulation 10/01/2012  . Urge incontinence 12/13/2011  . Vitamin D deficiency 11/02/2010  . Macular degeneration, bilateral 10/04/2010    Class: Chronic  . Iron deficiency anemia due to chronic blood loss 08/05/2010  . Solitary kidney, acquired 05/17/2010  . Spondylolisthesis of lumbar  region   . VITAMIN B12 DEFICIENCY 10/07/2009  . Chronic kidney disease (CKD), stage IV (severe) ([place]) 09/02/2009  . MYOCARDIAL INFARCTION, HX OF 09/25/2008  . VENTRICULAR HYPERTROPHY, LEFT 08/28/2008  . At risk for diabetes mellitus 09/11/2007  . SICK SINUS SYNDROME 04/27/2006  . Allergic rhinitis, seasonal 04/27/2006  . COPD, severe ([place]) 04/27/2006  . GASTROESOPHAGEAL REFLUX, NO ESOPHAGITIS 04/27/2006  . Osteoarthrosis involving lower leg 04/27/2006  . LUMBAR SPINAL STENOSIS 04/27/2006    Past Surgical History:  Procedure Laterality Date  . BREAST SURGERY     breast reduction  . CHOLECYSTECTOMY    . CORONARY ANGIOPLASTY WITH STENT PLACEMENT  2010   BMS RCA, OM2 occluded  . CYSTOSCOPY  09/30/2015   No cystoscopic evidence of uroepithelial neoplasm.  Follow up surveillance cystoscopy in one year  . DUPUYTREN CONTRACTURE RELEASE Right 05/22/2014   Procedure: DUPUYTREN RELEASE AND REPAIR AS NECESSARY RIGHT RING FINGER AND MIDDLE FINGER;  Surgeon: Roseanne Kaufman, MD;  Location: [facility];  Service: Orthopedics;  Laterality: Right;  . ESOPHAGOGASTRODUODENOSCOPY  04/27/2010   Dr Cristina Gong - found transient H/H & Schatzki's ring  . ESOPHAGOGASTRODUODENOSCOPY ENDOSCOPY  03/06/2012   Dr Cristina Gong - found non-obstuctive Schatzki's ring at [employer] [facility] o/w normal EGD.   Marland Kitchen EYE SURGERY     bilateral cataracts  . KNEE ARTHROSCOPY W/ SYNOVECTOMY  11/2009, left knee   Dr Wynelle Link  . NEPHRECTOMY  For transition cell cancer    Dr Tresa Endo, surgeon  . PACEMAKER GENERATOR CHANGE N/A 11/12/2012   Procedure: PACEMAKER GENERATOR CHANGE;  Surgeon: Evans Lance, MD; [employer] [facility] dual-chamber pacemaker serial number [id number]; Laterality: Right  . PACEMAKER INSERTION  2005   Dr Doreatha Lew  . PACEMAKER LEAD REMOVAL  2005   Removal and reinsertion of atrial and ventricular leads due to migration  . REPLACEMENT TOTAL KNEE  2009, Right knee   Dr Wynelle Link  . REPLACEMENT TOTAL KNEE  05/2009, Left knee   Dr Wynelle Link    . TONSILLECTOMY    . TRIGGER FINGER RELEASE Right 05/22/2014   Procedure: RIGHT HAND A-1 PULLEY RELEASE ;  Surgeon: Roseanne Kaufman, MD;  Location: [facility];  Service: Orthopedics;  Laterality: Right;     OB History   None      Home Medications    Prior to Admission medications   Medication Sig Start Date End Date Taking? Authorizing Provider  albuterol (PROVENTIL) (2.5 MG/3ML) 0.083% nebulizer solution Take 3 mLs (2.5 mg total) by nebulization every 4 (four) hours as needed for up to 7  days for wheezing or shortness of breath. 09/15/17 10/16/17 Yes Bonnita Hollow, MD  albuterol (VENTOLIN HFA) 108 (90 Base) MCG/ACT inhaler Inhale 2 puffs into the lungs every 6 (six) hours as needed for wheezing or shortness of breath.   Yes [provider]  amiodarone (PACERONE) 200 MG tablet Take 1 tablet (200 mg total) by mouth 2 (two) times daily. Patient taking differently: Take 200 mg by mouth daily.  10/16/17  Yes Belva Crome, MD  aspirin 81 MG chewable tablet Chew 81 mg by mouth 2 (two) times a week.    Yes [provider]  atorvastatin (LIPITOR) 20 MG tablet TAKE 1 TABLET BY MOUTH DAILY 12/19/16  Yes McDiarmid, Blane Ohara, MD  ELIQUIS 2.5 MG TABS tablet TAKE 1 TABLET BY MOUTH TWICE DAILY Patient taking differently: Take 2.5 mg by mouth daily.  04/28/17  Yes McDiarmid, Blane Ohara, MD  Fluticasone-Umeclidin-Vilant (TRELEGY ELLIPTA) 100-62.5-25 MCG/INH AEPB Inhale 1 puff into the lungs daily.   Yes [provider]  budesonide (PULMICORT) 0.5 MG/2ML nebulizer solution Take 2 mLs (0.5 mg total) by nebulization 2 (two) times daily. 10/09/17   McDiarmid, Blane Ohara, MD  calcium carbonate (OSCAL) 1500 (600 Ca) MG TABS tablet Take 600 mg of elemental calcium by mouth 2 (two) times daily with a meal.    [provider]  cephALEXin (KEFLEX) 500 MG capsule Take 500 mg by mouth 2 (two) times daily. 09/07/17   [provider]  Cholecalciferol 1000 units tablet Take 1 tablet by mouth  daily.     [provider]  docusate sodium (COLACE) 100 MG capsule Take 100 mg by mouth daily.    [provider]  fluticasone (FLONASE) 50 MCG/ACT nasal spray SHAKE LIQUID AND USE 2 SPRAYS IN EACH NOSTRIL DAILY Patient taking differently: Place 2 sprays into both nostrils daily.  01/31/17   Collene Gobble, MD  furosemide (LASIX) 20 MG tablet Take 40 mg daily for 2 days then change to 20 mg daily 10/16/17   Belva Crome, MD  ipratropium-albuterol (DUONEB) 0.5-2.5 (3) MG/3ML SOLN USE 3 ML VIA NEBULIZER EVERY 4 HOURS Patient taking differently: Take 3 mLs by nebulization every 4 (four) hours.  08/25/17   Collene Gobble, MD  metoprolol succinate (TOPROL-XL) 25 MG 24 hr tablet TAKE 1 TABLET(25 MG) BY MOUTH DAILY Patient taking differently: Take 25 mg by mouth daily.  02/16/17   McDiarmid, Blane Ohara, MD  nitroGLYCERIN (NITROSTAT) 0.4 MG SL tablet Place 1 tablet (0.4 mg total) under the tongue every 5 (five) minutes as needed for chest pain (x 3 tabs). 11/16/15   McDiarmid, Blane Ohara, MD  omeprazole (PRILOSEC) 20 MG capsule Take 1 capsule (20 mg total) by mouth daily. 09/14/17   Zenia Resides, MD  traMADol (ULTRAM) 50 MG tablet TAKE 1 TABLET BY MOUTH EVERY 6 HOURS AS NEEDED FOR PAIN 03/17/17   McDiarmid, Blane Ohara, MD  vitamin B-12 (CYANOCOBALAMIN) 1000 MCG tablet Take 1 tablet (1,000 mcg total) by mouth daily. 09/14/17   Zenia Resides, MD    Family History Family History  Problem Relation Age of Onset  . Dementia Mother   . Heart disease Father   . Heart attack Father     Social History Social History   Tobacco Use  . Smoking status: Former Smoker    Packs/day: 1.00    Types: Cigarettes    Last attempt to quit: 03/02/2009    Years since quitting: 8.6  . Smokeless tobacco:  Never Used  Substance Use Topics  . Alcohol use: Yes    Alcohol/week: 13.0 standard drinks    Types: 7 Glasses of wine, 6 Standard drinks or equivalent per week    Comment: 5 oz white wine per day  .  Drug use: No     Allergies   Baclofen; Codeine phosphate; Hydrochlorothiazide; Montelukast sodium; and Norco [hydrocodone-acetaminophen]   Review of Systems Review of Systems  Constitutional: Negative for chills and fever.  HENT: Negative for ear pain and sore throat.   Eyes: Negative for pain and visual disturbance.  Respiratory: Negative for cough and shortness of breath.   Cardiovascular: Positive for leg swelling. Negative for chest pain and palpitations.  Gastrointestinal: Negative for abdominal pain and vomiting.  Genitourinary: Negative for dysuria and hematuria.  Musculoskeletal: Negative for arthralgias and back pain.  Skin: Negative for color change and rash.  Neurological: Negative for seizures, syncope and headaches.  All other systems reviewed and are negative.    Physical Exam Updated Vital Signs  ED Triage Vitals  Enc Vitals Group     BP 10/16/17 1754 (!) 141/85     Pulse Rate 10/16/17 1754 95     Resp 10/16/17 1754 18     Temp 10/16/17 1754 98.1 F (36.7 C)     Temp Source 10/16/17 1754 Oral     SpO2 10/16/17 1754 97 %     Weight --      Height --      Head Circumference --      Peak Flow --      Pain Score 10/16/17 1752 0     Pain Loc --      Pain Edu? --      Excl. in [place]? --     Physical Exam  Constitutional: She is oriented to person, place, and time. She appears well-developed and well-nourished. No distress.  HENT:  Head: Normocephalic and atraumatic.  Mouth/Throat: No oropharyngeal exudate.  Eyes: Pupils are equal, round, and reactive to light. Conjunctivae and EOM are normal.  Neck: Normal range of motion. Neck supple.  Cardiovascular: Normal rate, normal heart sounds and intact distal pulses. An irregularly irregular rhythm present.  No murmur heard. Pulmonary/Chest: Effort normal. No respiratory distress. She has wheezes.  Abdominal: Soft. There is no tenderness.  Musculoskeletal: Normal range of motion. She exhibits edema.    Neurological: She is alert and oriented to person, place, and time.  Skin: Skin is warm and dry. Capillary refill takes less than 2 seconds.  Psychiatric: She has a normal mood and affect.  Nursing note and vitals reviewed.    ED Treatments / Results  Labs (all labs ordered are listed, but only abnormal results are displayed) Labs Reviewed  BASIC METABOLIC PANEL - Abnormal; Notable for the following components:      Result Value   BUN 24 (*)    Creatinine, Ser 1.60 (*)    GFR calc non Af Amer 27 (*)    GFR calc Af Amer 31 (*)    All other components within normal limits  CBC - Abnormal; Notable for the following components:   RBC 3.73 (*)    Hemoglobin 11.0 (*)    MCHC 29.8 (*)    All other components within normal limits  CBC WITH DIFFERENTIAL/PLATELET - Abnormal; Notable for the following components:   RBC 3.52 (*)    Hemoglobin 10.6 (*)    HCT 35.1 (*)    All other components within normal limits  COMPREHENSIVE METABOLIC PANEL - Abnormal; Notable for the following components:   BUN 24 (*)    Creatinine, Ser 1.58 (*)    Calcium 8.8 (*)    Total Protein 5.9 (*)    Albumin 3.3 (*)    GFR calc non Af Amer 27 (*)    GFR calc Af Amer 32 (*)    All other components within normal limits  BRAIN NATRIURETIC PEPTIDE - Abnormal; Notable for the following components:   B Natriuretic Peptide 813.7 (*)    All other components within normal limits  MAGNESIUM  PROTIME-INR  TSH  BASIC METABOLIC PANEL  I-STAT TROPONIN, ED    EKG EKG Interpretation  Date/Time:  Monday October 16 2017 17:52:09 EDT Ventricular Rate:  104 PR Interval:    QRS Duration: 132 QT Interval:  364 QTC Calculation: 478 R Axis:   -17 Text Interpretation:  Atrial fibrillation with rapid ventricular response Left bundle branch block Abnormal ECG Confirmed by Lennice Sites [phone]) on 10/16/2017 7:18:03 PM   Radiology Dg Chest 2 View  Result Date: 10/16/2017 CLINICAL DATA:  Short of breath EXAM: CHEST -  2 VIEW COMPARISON:  08/30/2017, 08/24/2017, 08/16/2016 FINDINGS: Right-sided central pacing device with leads over the right atrium and right ventricle. No significant pleural effusion. Mild cardiomegaly with vascular congestion. Aortic atherosclerosis. No pneumothorax. Stable scarring at the lingula. IMPRESSION: No active cardiopulmonary disease. Cardiomegaly with mild central vascular congestion. Electronically Signed   By: Donavan Foil M.D.   On: 10/16/2017 18:37    Procedures Procedures (including critical care time)  Medications Ordered in ED Medications  traMADol (ULTRAM) tablet 50 mg (has no administration in time range)  amiodarone (PACERONE) tablet 200 mg (200 mg Oral Given 10/16/17 2158)  atorvastatin (LIPITOR) tablet 20 mg (20 mg Oral Given 10/16/17 2031)  docusate sodium (COLACE) capsule 100 mg (100 mg Oral Given 10/16/17 2031)  pantoprazole (PROTONIX) EC tablet 40 mg (40 mg Oral Not Given 10/16/17 2201)  calcium carbonate (OS-CAL - dosed in mg of elemental calcium) tablet 500 mg of elemental calcium (has no administration in time range)  cholecalciferol (VITAMIN D) tablet 1,000 Units (has no administration in time range)  budesonide (PULMICORT) nebulizer solution 0.5 mg (0.5 mg Nebulization Given 10/16/17 2159)  fluticasone (FLONASE) 50 MCG/ACT nasal spray 1 spray (has no administration in time range)  ipratropium-albuterol (DUONEB) 0.5-2.5 (3) MG/3ML nebulizer solution 3 mL (has no administration in time range)  sodium chloride flush (NS) 0.9 % injection 3 mL (has no administration in time range)  sodium chloride flush (NS) 0.9 % injection 3 mL (has no administration in time range)  0.9 %  sodium chloride infusion (has no administration in time range)  acetaminophen (TYLENOL) tablet 650 mg (650 mg Oral Given 10/16/17 2035)  ondansetron (ZOFRAN) injection 4 mg (has no administration in time range)  furosemide (LASIX) injection 40 mg (40 mg Intravenous Given 10/16/17 2036)  potassium  chloride SA (K-DUR,KLOR-CON) CR tablet 20 mEq (20 mEq Oral Given 10/16/17 2031)  metoprolol succinate (TOPROL-XL) 24 hr tablet 25 mg (has no administration in time range)  nitroGLYCERIN (NITROSTAT) SL tablet 0.4 mg (has no administration in time range)  apixaban (ELIQUIS) tablet 2.5 mg (has no administration in time range)  ALPRAZolam (XANAX) tablet 0.25 mg (has no administration in time range)     Initial Impression / Assessment and Plan / ED Course  I have reviewed the triage vital signs and the nursing notes.  Pertinent labs & imaging results that were available during  my care of the patient were reviewed by me and considered in my medical decision making (see chart for details).     [name] is a 84 year old female with history of atrial fibrillation, heart failure who presents to the ED from cardiology clinic for admission for atrial fibrillation and possible heart failure.  Patient with overall unremarkable labs.  Hemoglobin at baseline.  Creatinine at baseline.  Patient with increasing swelling in her legs over the last several days.  Has had intermittent palpitations.  At cardiologist office patient was in A. fib with RVR that is now improved without medications.  She denies any chest pain, abdominal pain at this time.  Patient appears volume overloaded on exam.  No concern for PE.  Patient already on anticoagulation.  EKG showed atrial fibrillation at a rate of 104 bpm.  Patient hemodynamically stable. No chest pain and doubt ACS.  Chest x-ray showed no signs of pneumonia, pneumothorax, pleural effusion.  Does appear to be some increased vascular congestion on chest x-ray consistent with volume overload.  Patient admitted to cardiology service in stable condition for diuresis.  Final Clinical Impressions(s) / ED Diagnoses   Final diagnoses:  CHF (congestive heart failure) [facility])    ED Discharge Orders    None       Lennice Sites, DO 10/16/17 2340

## 2017-10-16 NOTE — ED Notes (Signed)
Patient transported to X-ray 

## 2017-10-16 NOTE — ED Provider Notes (Signed)
Patient placed in Quick Look pathway, seen and evaluated   Chief Complaint: A.fib with RVR, sent by PCP, SOB  HPI:   84 year old female presents for evaluation of A.fib with RVR, sent by her PCP Dr. [name]. She went for a routine visit today and was found to have a HR in the 120s. He advised her to come to the ED. She denies feeling lightheaded, chest pain, leg swelling. She has had more SOB, wheezing than usual. She has been using her inhalers without relief. She is not on home O2  ROS: +SOB  Physical Exam:   Gen: No distress  Neuro: Awake and Alert  Skin: Warm    Focused Exam: Heart: Normal rate, irregularly irregular    Lungs: Mildly SOB with talking. Diffuse rhonchi and wheezing   Initiation of care has begun. The patient has been counseled on the process, plan, and necessity for staying for the completion/evaluation, and the remainder of the medical screening examination    Recardo Evangelist, PA-C 10/16/17 Anola Gurney, MD 10/17/17 [phone]

## 2017-10-16 NOTE — ED Triage Notes (Signed)
Pt to ER sent from cardiology office for a fib RVR, states already had check up appt scheduled for today but began feeling short of breath this morning. Pt denies chest pain.

## 2017-10-17 ENCOUNTER — Other Ambulatory Visit (HOSPITAL_COMMUNITY): Payer: Medicare Other

## 2017-10-17 ENCOUNTER — Encounter: Payer: Self-pay | Admitting: Family Medicine

## 2017-10-17 ENCOUNTER — Inpatient Hospital Stay (HOSPITAL_COMMUNITY): Payer: Medicare Other

## 2017-10-17 DIAGNOSIS — I481 Persistent atrial fibrillation: Secondary | ICD-10-CM

## 2017-10-17 LAB — BASIC METABOLIC PANEL
Anion gap: 7 (ref 5–15)
BUN: 25 mg/dL — AB (ref 8–23)
CALCIUM: 8.8 mg/dL — AB (ref 8.9–10.3)
CHLORIDE: 107 mmol/L (ref 98–111)
CO2: 29 mmol/L (ref 22–32)
Creatinine, Ser: 1.78 mg/dL — ABNORMAL HIGH (ref 0.44–1.00)
GFR, EST AFRICAN AMERICAN: 28 mL/min — AB (ref >60)
GFR, EST NON AFRICAN AMERICAN: 24 mL/min — AB (ref >60)
Glucose, Bld: 105 mg/dL — ABNORMAL HIGH (ref 70–99)
Potassium: 4.3 mmol/L (ref 3.5–5.1)
SODIUM: 143 mmol/L (ref 135–145)

## 2017-10-17 MED ORDER — BUDESONIDE 0.5 MG/2ML IN SUSP
0.5000 mg | Freq: Two times a day (BID) | RESPIRATORY_TRACT | Status: DC
  Administered 2017-10-17 – 2017-10-21 (×9): 0.5 mg via RESPIRATORY_TRACT
  Filled 2017-10-17 (×9): qty 2

## 2017-10-17 MED ORDER — IPRATROPIUM-ALBUTEROL 0.5-2.5 (3) MG/3ML IN SOLN
3.0000 mL | Freq: Four times a day (QID) | RESPIRATORY_TRACT | Status: DC
  Administered 2017-10-17 – 2017-10-18 (×5): 3 mL via RESPIRATORY_TRACT
  Filled 2017-10-17 (×5): qty 3

## 2017-10-17 NOTE — Progress Notes (Addendum)
Progress Note  Patient Name: [name] Date of Encounter: 10/17/2017  Primary Cardiologist: Belva Crome III, MD   Subjective   No chest pain, dyspnea improved,  + wheezes.  Still in a fib, now rate controlled.   Inpatient Medications    Scheduled Meds: . amiodarone  200 mg Oral BID  . apixaban  2.5 mg Oral BID  . atorvastatin  20 mg Oral Daily  . budesonide  0.5 mg Nebulization BID  . calcium carbonate  500 mg of elemental calcium Oral BID WC  . cholecalciferol  1,000 Units Oral Daily  . docusate sodium  100 mg Oral Daily  . fluticasone  1 spray Each Nare Daily  . metoprolol succinate  25 mg Oral BID  . pantoprazole  40 mg Oral Daily  . potassium chloride  20 mEq Oral Daily  . sodium chloride flush  3 mL Intravenous Q12H   Continuous Infusions: . sodium chloride     PRN Meds: sodium chloride, acetaminophen, ALPRAZolam, ipratropium-albuterol, nitroGLYCERIN, ondansetron (ZOFRAN) IV, sodium chloride flush, traMADol   Vital Signs    Vitals:   10/17/17 0157 10/17/17 0432 10/17/17 0837 10/17/17 0859  BP:  132/76  133/84  Pulse:  (!) 58 77 87  Resp:  18 (!) 21   Temp:  97.8 F (36.6 C)    TempSrc:  Oral    SpO2:  98% 96%   Weight: 79 kg     Height: 5\' 5"  (1.651 m)       Intake/Output Summary (Last 24 hours) at 10/17/2017 1031 Last data filed at 10/17/2017 3845 Gross per 24 hour  Intake 0 ml  Output 1600 ml  Net -1600 ml   Filed Weights   10/17/17 0157  Weight: 79 kg    Telemetry    A fib rate controlled, occ pacing - Personally Reviewed  ECG    No new from yesterday - Personally Reviewed  Physical Exam   GEN: No acute distress.   Neck: + JVD Cardiac: irreg irreg, no murmurs, rubs, or gallops.  Respiratory: Clear of rales to auscultation bilaterally. + wheezes throughout. GI: Soft, nontender, non-distended  MS: improved edema- now mostly at ankles; No deformity. Neuro:  Nonfocal  Psych: Normal affect   Labs    Chemistry Recent Labs  Lab  10/16/17 1803 10/16/17 2002 10/17/17 0419  NA 142 143 143  K 4.3 4.4 4.3  CL 106 109 107  CO2 26 24 29   GLUCOSE 97 96 105*  BUN 24* 24* 25*  CREATININE 1.60* 1.58* 1.78*  CALCIUM 9.0 8.8* 8.8*  PROT  --  5.9*  --   ALBUMIN  --  3.3*  --   AST  --  15  --   ALT  --  11  --   ALKPHOS  --  60  --   BILITOT  --  1.0  --   GFRNONAA 27* 27* 24*  GFRAA 31* 32* 28*  ANIONGAP 10 10 7      Hematology Recent Labs  Lab 10/16/17 1803 10/16/17 2002  WBC 8.6 8.3  RBC 3.73* 3.52*  HGB 11.0* 10.6*  HCT 36.9 35.1*  MCV 98.9 99.7  MCH 29.5 30.1  MCHC 29.8* 30.2  RDW 15.5 15.5  PLT 209 198    Cardiac EnzymesNo results for input(s): TROPONINI in the last 168 hours.  Recent Labs  Lab 10/16/17 1815  TROPIPOC 0.02     BNP Recent Labs  Lab 10/16/17 2002  BNP 813.7*  DDimer No results for input(s): DDIMER in the last 168 hours.   Radiology    Dg Chest 2 View  Result Date: 10/17/2017 CLINICAL DATA:  CHF. EXAM: CHEST - 2 VIEW COMPARISON:  10/16/2017.  08/16/2016. FINDINGS: Cardiac pacer with lead tip over the right atrium right ventricle. Stable cardiomegaly. Mild left base subsegmental atelectasis and/or scarring. No pneumothorax. Surgical clips upper abdomen. IMPRESSION: 1. Cardiac pacer noted with lead tips over the right atrium right ventricle. Stable cardiomegaly. 2. Mild left base atelectasis and or scarring. Chest appears stable from prior exam. Electronically Signed   By: Marcello Moores  Register   On: 10/17/2017 07:19   Dg Chest 2 View  Result Date: 10/16/2017 CLINICAL DATA:  Short of breath EXAM: CHEST - 2 VIEW COMPARISON:  08/30/2017, 08/24/2017, 08/16/2016 FINDINGS: Right-sided central pacing device with leads over the right atrium and right ventricle. No significant pleural effusion. Mild cardiomegaly with vascular congestion. Aortic atherosclerosis. No pneumothorax. Stable scarring at the lingula. IMPRESSION: No active cardiopulmonary disease. Cardiomegaly with mild central  vascular congestion. Electronically Signed   By: Donavan Foil M.D.   On: 10/16/2017 18:37    Cardiac Studies   Echo pending previously normal EF  Patient Profile     84 y.o. female with a history of CAD, chronic diastolic heart failure, stage III chronic kidney disease, sick sinus syndrome with paroxysmal atrial fibrillation on chronic eliquis. and bradycardia, permanent pacemaker therapy, chronic therapy with amiodarone, and COPD.  She gives a 2-week history of progressive lower extremity swelling and dyspnea- a fib RVR. Admitted from office yesterday.    Assessment & Plan    A fib with RVR, pt was not aware of tachycardia.  Unsure how long she has been in.  Has been on Eliquis- no missed doses. On chronic amiodarone therapy.   --TSH 1.374 --Amiodarone increased to 200 BID from once daily ? DCCV wed or Thursday.    Dyspnea/CHF diastolic and with a fib increasing for 2 weeks.  --BNP 813 --Neg 1600 since admit  Wt in office 80.7 kg now 79 kg, was on lasix 40 mg IV BID   CAD with hx of coronary stent. Neg troponin.   COPD-- wheezing with this admit for 2 weeks. Will ask RN to give neb treatment that is ordered.   Tachy/Brady syndrome with PPM followed by Dr. Lovena Le.  MDT  CKD-4 --Cr elevated to 1.78 from 1.58. May need to change to po lasix tomorrow.    For questions or updates, please contact [place] Please consult [email or web address] for contact info under Cardiology/STEMI.      Signed, Cecilie Kicks, NP  10/17/2017, 10:31 AM     Attending Note:   The patient was seen and examined.  Agree with assessment and plan as noted above.  Changes made to the above note as needed.  Patient seen and independently examined with Cecilie Kicks, NP .   We discussed all aspects of the encounter. I agree with the assessment and plan as stated above.  1.   Atrial fibrillation: The patient's amiodarone dose has been increased.  We will cardioversion in 1 to 2 days.   Will try to improve her  wheezing prior to arranging cardioversion   2.  Acute on chronic congestive heart failure: Diastolic congestive heart failure.  She is diuresed 1.9 L since admission.  Function has gone slightly.  We will need to watch this closely.  3.  Coronary artery disease: History of coronary stenting.  Her troponins remain negative.  4.  COPD - is still wheezing.   Will change the duoneb to Q6 hr ( scheduled instead of PRN)    I have spent a total of 40 minutes with patient reviewing hospital  notes , telemetry, EKGs, labs and examining patient as well as establishing an assessment and plan that was discussed with the patient. > 50% of time was spent in direct patient care.    Thayer Headings, Brooke Bonito., MD, [facility] 10/17/2017, 11:21 AM [address]  [place] Pager [phone]

## 2017-10-17 NOTE — Plan of Care (Signed)

## 2017-10-17 NOTE — Care Management Note (Signed)
Case Management Note  Patient Details  Name: [name] MRN: [id number] Date of Birth: [birth year]  Subjective/Objective:   CHF                Action/Plan: Patient lives at home with her daughter who is a Catering manager; Primary Care Provider: McDiarmid, Blane Ohara, MD; has private insurance with Medicare; pharmacy of choice is [facility] on [address]; DME - cane; she has family members that take her to her apts, continues to drive at times; CM will continue to follow for progression of care.  Expected Discharge Date:     Possibly 10/20/2017             Expected Discharge Plan:  Home/Self Care  In-House Referral:   [facility] program for CHF  Discharge planning Services  CM Consult  Status of Service:  In process, will continue to follow  Sherrilyn Rist [phone] 10/17/2017, 10:51 AM

## 2017-10-18 ENCOUNTER — Other Ambulatory Visit (HOSPITAL_COMMUNITY): Payer: Medicare Other

## 2017-10-18 LAB — BASIC METABOLIC PANEL
ANION GAP: 5 (ref 5–15)
BUN: 30 mg/dL — ABNORMAL HIGH (ref 8–23)
CO2: 29 mmol/L (ref 22–32)
Calcium: 8.5 mg/dL — ABNORMAL LOW (ref 8.9–10.3)
Chloride: 106 mmol/L (ref 98–111)
Creatinine, Ser: 2.11 mg/dL — ABNORMAL HIGH (ref 0.44–1.00)
GFR calc Af Amer: 22 mL/min — ABNORMAL LOW (ref >60)
GFR, EST NON AFRICAN AMERICAN: 19 mL/min — AB (ref >60)
Glucose, Bld: 95 mg/dL (ref 70–99)
POTASSIUM: 4.5 mmol/L (ref 3.5–5.1)
SODIUM: 140 mmol/L (ref 135–145)

## 2017-10-18 MED ORDER — IPRATROPIUM-ALBUTEROL 0.5-2.5 (3) MG/3ML IN SOLN
3.0000 mL | Freq: Four times a day (QID) | RESPIRATORY_TRACT | Status: DC
  Administered 2017-10-19: 3 mL via RESPIRATORY_TRACT
  Filled 2017-10-18: qty 3

## 2017-10-18 MED ORDER — DOCUSATE SODIUM 100 MG PO CAPS: 200.0000 mg | ORAL_CAPSULE | Freq: Every day | ORAL | Status: DC

## 2017-10-18 NOTE — Progress Notes (Addendum)
Progress Note  Patient Name: [name] Date of Encounter: 10/18/2017  Primary Cardiologist: Sinclair Grooms, MD   Subjective   Still with some SOB, will change nebs to more regular times. No chest pain.  Does complain of blisters on legs. Now healing.    Inpatient Medications    Scheduled Meds: . amiodarone  200 mg Oral BID  . apixaban  2.5 mg Oral BID  . atorvastatin  20 mg Oral Daily  . budesonide  0.5 mg Nebulization BID  . calcium carbonate  500 mg of elemental calcium Oral BID WC  . cholecalciferol  1,000 Units Oral Daily  . docusate sodium  100 mg Oral Daily  . fluticasone  1 spray Each Nare Daily  . ipratropium-albuterol  3 mL Nebulization Q6H  . metoprolol succinate  25 mg Oral BID  . pantoprazole  40 mg Oral Daily  . potassium chloride  20 mEq Oral Daily  . sodium chloride flush  3 mL Intravenous Q12H   Continuous Infusions: . sodium chloride     PRN Meds: sodium chloride, acetaminophen, ALPRAZolam, nitroGLYCERIN, ondansetron (ZOFRAN) IV, sodium chloride flush, traMADol   Vital Signs    Vitals:   10/17/17 2035 10/18/17 0021 10/18/17 0518 10/18/17 0831  BP:  123/67 (!) 133/55   Pulse:  96 91 89  Resp:  18 20 (!) 22  Temp:  97.6 F (36.4 C) 98 F (36.7 C)   TempSrc:  Oral Oral   SpO2: 98% 98% 97% 96%  Weight:   78.1 kg   Height:        Intake/Output Summary (Last 24 hours) at 10/18/2017 1111 Last data filed at 10/18/2017 0859 Gross per 24 hour  Intake 1200 ml  Output 1175 ml  Net 25 ml   Filed Weights   10/17/17 0157 10/18/17 0518  Weight: 79 kg 78.1 kg    Telemetry    A fib - Personally Reviewed  ECG    No new - Personally Reviewed  Physical Exam   GEN: No acute distress.   Neck: No JVD Cardiac: irreg irreg, no murmurs, rubs, or gallops.  Respiratory: Clear  For rales to auscultation bilaterally. But + insp. And exp wheezes GI: Soft, nontender, non-distended  MS: tr lower ext edema; No deformity. Small ulcer Lt post ankle and  dried ulcer Rt post lower ext Neuro:  Nonfocal  Psych: Normal affect   Labs    Chemistry Recent Labs  Lab 10/16/17 2002 10/17/17 0419 10/18/17 0419  NA 143 143 140  K 4.4 4.3 4.5  CL 109 107 106  CO2 24 29 29   GLUCOSE 96 105* 95  BUN 24* 25* 30*  CREATININE 1.58* 1.78* 2.11*  CALCIUM 8.8* 8.8* 8.5*  PROT 5.9*  --   --   ALBUMIN 3.3*  --   --   AST 15  --   --   ALT 11  --   --   ALKPHOS 60  --   --   BILITOT 1.0  --   --   GFRNONAA 27* 24* 19*  GFRAA 32* 28* 22*  ANIONGAP 10 7 5      Hematology Recent Labs  Lab 10/16/17 1803 10/16/17 2002  WBC 8.6 8.3  RBC 3.73* 3.52*  HGB 11.0* 10.6*  HCT 36.9 35.1*  MCV 98.9 99.7  MCH 29.5 30.1  MCHC 29.8* 30.2  RDW 15.5 15.5  PLT 209 198    Cardiac EnzymesNo results for input(s): TROPONINI in the last  168 hours.  Recent Labs  Lab 10/16/17 1815  TROPIPOC 0.02     BNP Recent Labs  Lab 10/16/17 2002  BNP 813.7*     DDimer No results for input(s): DDIMER in the last 168 hours.   Radiology    Dg Chest 2 View  Result Date: 10/17/2017 CLINICAL DATA:  CHF. EXAM: CHEST - 2 VIEW COMPARISON:  10/16/2017.  08/16/2016. FINDINGS: Cardiac pacer with lead tip over the right atrium right ventricle. Stable cardiomegaly. Mild left base subsegmental atelectasis and/or scarring. No pneumothorax. Surgical clips upper abdomen. IMPRESSION: 1. Cardiac pacer noted with lead tips over the right atrium right ventricle. Stable cardiomegaly. 2. Mild left base atelectasis and or scarring. Chest appears stable from prior exam. Electronically Signed   By: Marcello Moores  Register   On: 10/17/2017 07:19   Dg Chest 2 View  Result Date: 10/16/2017 CLINICAL DATA:  Short of breath EXAM: CHEST - 2 VIEW COMPARISON:  08/30/2017, 08/24/2017, 08/16/2016 FINDINGS: Right-sided central pacing device with leads over the right atrium and right ventricle. No significant pleural effusion. Mild cardiomegaly with vascular congestion. Aortic atherosclerosis. No  pneumothorax. Stable scarring at the lingula. IMPRESSION: No active cardiopulmonary disease. Cardiomegaly with mild central vascular congestion. Electronically Signed   By: Donavan Foil M.D.   On: 10/16/2017 18:37    Cardiac Studies   Echo pending.  Patient Profile     84 y.o. female with a history of CAD, chronic diastolic heart failure, stage III chronic kidney disease, sick sinus syndrome with paroxysmal atrial fibrillation on chronic eliquis. and bradycardia, permanent pacemaker therapy, chronic therapy with amiodarone, and COPD. She gives a 2-week history of progressive lower extremity swelling and dyspnea- a fib RVR. Admitted from office 10/16/17.  Assessment & Plan    A fib with RVR, pt was not aware of tachycardia.  Unsure how long she has been in.  Has been on Eliquis- no missed doses. On chronic amiodarone therapy.   --TSH 1.374 --Amiodarone increased to 200 BID from once daily --plan for DCCV Thursday or Friday- defer to Dr. Acie Fredrickson  Dyspnea/CHF diastolic and with a fib increasing for 2 weeks.  --BNP 813 --Neg 1335 since admit  Wt in office 80.7 kg now 78.1 kg, was on lasix 40 mg IV BID --last dose yesterday-- not sure if all of urine has been counted per pt.  CAD with hx of coronary stent. Neg troponin.   COPD-- wheezing with this admit for 2 weeks.   Tachy/Brady syndrome with PPM followed by Dr. Lovena Le.  MDT  CKD-4 --Cr elevated to 1.78 from 1.58. Lasix held and Cr today 2.11.      Constipation.  Will add colace today   For questions or updates, please contact [place] Please consult [email or web address] for contact info under Cardiology/STEMI.      Signed, Cecilie Kicks, NP  10/18/2017, 11:11 AM    Attending Note:   The patient was seen and examined.  Agree with assessment and plan as noted above.  Changes made to the above note as needed.  Patient seen and independently examined with Cecilie Kicks, NP    We discussed all aspects of the encounter. I agree  with the assessment and plan as stated above.    1.  Atrial fibrillation: Her heart rate is slightly better.  She still has a heart rate in the 100-1 05 range.  I am hoping to improve her wheezing before we attempt cardioversion.  We will anticipate scheduling the cardioversion for Friday.  Continue  current dose of amiodarone.  Continue Eliquis.  2.  Bronchospasm: Continue Duoneb nebulizer and Pulmicort  3.  Chronic kidney disease stage IV: Her creatinine continues to increase.  6 has been discontinued.   I have spent a total of 40 minutes with patient reviewing hospital  notes , telemetry, EKGs, labs and examining patient as well as establishing an assessment and plan that was discussed with the patient. > 50% of time was spent in direct patient care.    Thayer Headings, Brooke Bonito., MD, [facility] 10/18/2017, 5:58 PM [address],  [place] Pager [phone]

## 2017-10-19 ENCOUNTER — Ambulatory Visit: Payer: Medicare Other | Admitting: Physician Assistant

## 2017-10-19 ENCOUNTER — Inpatient Hospital Stay (HOSPITAL_COMMUNITY): Payer: Medicare Other

## 2017-10-19 DIAGNOSIS — I361 Nonrheumatic tricuspid (valve) insufficiency: Secondary | ICD-10-CM

## 2017-10-19 LAB — BASIC METABOLIC PANEL
ANION GAP: 9 (ref 5–15)
BUN: 32 mg/dL — ABNORMAL HIGH (ref 8–23)
CO2: 27 mmol/L (ref 22–32)
Calcium: 8.8 mg/dL — ABNORMAL LOW (ref 8.9–10.3)
Chloride: 106 mmol/L (ref 98–111)
Creatinine, Ser: 1.81 mg/dL — ABNORMAL HIGH (ref 0.44–1.00)
GFR calc non Af Amer: 23 mL/min — ABNORMAL LOW (ref >60)
GFR, EST AFRICAN AMERICAN: 27 mL/min — AB (ref >60)
GLUCOSE: 99 mg/dL (ref 70–99)
POTASSIUM: 4.7 mmol/L (ref 3.5–5.1)
Sodium: 142 mmol/L (ref 135–145)

## 2017-10-19 LAB — ECHOCARDIOGRAM COMPLETE
HEIGHTINCHES: 65 in
Weight: 2793.6 oz

## 2017-10-19 MED ORDER — IPRATROPIUM-ALBUTEROL 0.5-2.5 (3) MG/3ML IN SOLN
3.0000 mL | Freq: Two times a day (BID) | RESPIRATORY_TRACT | Status: DC
  Administered 2017-10-19 – 2017-10-20 (×2): 3 mL via RESPIRATORY_TRACT
  Filled 2017-10-19 (×2): qty 3

## 2017-10-19 NOTE — Progress Notes (Signed)
  Echocardiogram 2D Echocardiogram has been performed.  [name] 10/19/2017, 11:21 AM

## 2017-10-19 NOTE — Care Management Important Message (Signed)
Important Message  Patient Details  Name: [name] MRN: [id number] Date of Birth: [birth year]   Medicare Important Message Given:  Yes    [name] P [place] 10/19/2017, 2:45 PM

## 2017-10-19 NOTE — H&P (View-Only) (Signed)
Progress Note  Patient Name: [name] Date of Encounter: 10/19/2017  Primary Cardiologist: Sinclair Grooms, MD   Subjective    Pt feels better.  Breathing is better.  Inpatient Medications    Scheduled Meds: . amiodarone  200 mg Oral BID  . apixaban  2.5 mg Oral BID  . atorvastatin  20 mg Oral Daily  . budesonide  0.5 mg Nebulization BID  . calcium carbonate  500 mg of elemental calcium Oral BID WC  . cholecalciferol  1,000 Units Oral Daily  . docusate sodium  100 mg Oral Daily  . fluticasone  1 spray Each Nare Daily  . ipratropium-albuterol  3 mL Nebulization BID  . metoprolol succinate  25 mg Oral BID  . pantoprazole  40 mg Oral Daily  . sodium chloride flush  3 mL Intravenous Q12H   Continuous Infusions: . sodium chloride     PRN Meds: sodium chloride, acetaminophen, ALPRAZolam, nitroGLYCERIN, ondansetron (ZOFRAN) IV, sodium chloride flush, traMADol   Vital Signs    Vitals:   10/18/17 2041 10/19/17 0536 10/19/17 0811 10/19/17 1121  BP: 124/75 125/80  (!) 142/97  Pulse: 75 77 76 (!) 107  Resp: 18 18 18 18   Temp: 98 F (36.7 C) 98.3 F (36.8 C)  (!) 97.3 F (36.3 C)  TempSrc: Oral Oral  Oral  SpO2: 94% 96% 96% 97%  Weight:  79.2 kg    Height:        Intake/Output Summary (Last 24 hours) at 10/19/2017 1522 Last data filed at 10/19/2017 1501 Gross per 24 hour  Intake 960 ml  Output 900 ml  Net 60 ml   Filed Weights   10/17/17 0157 10/18/17 0518 10/19/17 0536  Weight: 79 kg 78.1 kg 79.2 kg    Telemetry    AF with RVR  - Personally Reviewed  ECG     AF with RVR  - Personally Reviewed  Physical Exam   GEN: No acute distress.   Neck: No JVD Cardiac: Irreg. Irreg.  Respiratory: Bilateral wheezes. GI: Soft, nontender, non-distended  MS: No edema; No deformity. Neuro:  Nonfocal  Psych: Normal affect   Labs    Chemistry Recent Labs  Lab 10/16/17 2002 10/17/17 0419 10/18/17 0419 10/19/17 0440  NA 143 143 140 142  K 4.4 4.3 4.5  4.7  CL 109 107 106 106  CO2 24 29 29 27   GLUCOSE 96 105* 95 99  BUN 24* 25* 30* 32*  CREATININE 1.58* 1.78* 2.11* 1.81*  CALCIUM 8.8* 8.8* 8.5* 8.8*  PROT 5.9*  --   --   --   ALBUMIN 3.3*  --   --   --   AST 15  --   --   --   ALT 11  --   --   --   ALKPHOS 60  --   --   --   BILITOT 1.0  --   --   --   GFRNONAA 27* 24* 19* 23*  GFRAA 32* 28* 22* 27*  ANIONGAP 10 7 5 9      Hematology Recent Labs  Lab 10/16/17 1803 10/16/17 2002  WBC 8.6 8.3  RBC 3.73* 3.52*  HGB 11.0* 10.6*  HCT 36.9 35.1*  MCV 98.9 99.7  MCH 29.5 30.1  MCHC 29.8* 30.2  RDW 15.5 15.5  PLT 209 198    Cardiac EnzymesNo results for input(s): TROPONINI in the last 168 hours.  Recent Labs  Lab 10/16/17 1815  TROPIPOC 0.02  BNP Recent Labs  Lab 10/16/17 2002  BNP 813.7*     DDimer No results for input(s): DDIMER in the last 168 hours.   Radiology    No results found.  Cardiac Studies     Patient Profile     84 y.o. female admitted with rapid atrial for ablation and wheezing.  Assessment & Plan    1.  Atrial fibrillation: She is scheduled for cardioversion tomorrow.  She has not missed any doses  of her Eliquis.   2.  Pulmonary: She is continues to wheeze.  She is on duo nebs twice a day and also Pulmicort twice a day.  To new current medications.  3.  Chronic renal insufficiency: Creatinine is slightly better after holding her Lasix for several days.  We have also stop potassium.  For questions or updates, please contact [place] Please consult [email or web address] for contact info under Cardiology/STEMI.      Signed, Mertie Moores, MD  10/19/2017, 3:22 PM

## 2017-10-19 NOTE — Progress Notes (Signed)
Progress Note  Patient Name: [name] Date of Encounter: 10/19/2017  Primary Cardiologist: Sinclair Grooms, MD   Subjective    Pt feels better.  Breathing is better.  Inpatient Medications    Scheduled Meds: . amiodarone  200 mg Oral BID  . apixaban  2.5 mg Oral BID  . atorvastatin  20 mg Oral Daily  . budesonide  0.5 mg Nebulization BID  . calcium carbonate  500 mg of elemental calcium Oral BID WC  . cholecalciferol  1,000 Units Oral Daily  . docusate sodium  100 mg Oral Daily  . fluticasone  1 spray Each Nare Daily  . ipratropium-albuterol  3 mL Nebulization BID  . metoprolol succinate  25 mg Oral BID  . pantoprazole  40 mg Oral Daily  . sodium chloride flush  3 mL Intravenous Q12H   Continuous Infusions: . sodium chloride     PRN Meds: sodium chloride, acetaminophen, ALPRAZolam, nitroGLYCERIN, ondansetron (ZOFRAN) IV, sodium chloride flush, traMADol   Vital Signs    Vitals:   10/18/17 2041 10/19/17 0536 10/19/17 0811 10/19/17 1121  BP: 124/75 125/80  (!) 142/97  Pulse: 75 77 76 (!) 107  Resp: 18 18 18 18   Temp: 98 F (36.7 C) 98.3 F (36.8 C)  (!) 97.3 F (36.3 C)  TempSrc: Oral Oral  Oral  SpO2: 94% 96% 96% 97%  Weight:  79.2 kg    Height:        Intake/Output Summary (Last 24 hours) at 10/19/2017 1522 Last data filed at 10/19/2017 1501 Gross per 24 hour  Intake 960 ml  Output 900 ml  Net 60 ml   Filed Weights   10/17/17 0157 10/18/17 0518 10/19/17 0536  Weight: 79 kg 78.1 kg 79.2 kg    Telemetry    AF with RVR  - Personally Reviewed  ECG     AF with RVR  - Personally Reviewed  Physical Exam   GEN: No acute distress.   Neck: No JVD Cardiac: Irreg. Irreg.  Respiratory: Bilateral wheezes. GI: Soft, nontender, non-distended  MS: No edema; No deformity. Neuro:  Nonfocal  Psych: Normal affect   Labs    Chemistry Recent Labs  Lab 10/16/17 2002 10/17/17 0419 10/18/17 0419 10/19/17 0440  NA 143 143 140 142  K 4.4 4.3 4.5  4.7  CL 109 107 106 106  CO2 24 29 29 27   GLUCOSE 96 105* 95 99  BUN 24* 25* 30* 32*  CREATININE 1.58* 1.78* 2.11* 1.81*  CALCIUM 8.8* 8.8* 8.5* 8.8*  PROT 5.9*  --   --   --   ALBUMIN 3.3*  --   --   --   AST 15  --   --   --   ALT 11  --   --   --   ALKPHOS 60  --   --   --   BILITOT 1.0  --   --   --   GFRNONAA 27* 24* 19* 23*  GFRAA 32* 28* 22* 27*  ANIONGAP 10 7 5 9      Hematology Recent Labs  Lab 10/16/17 1803 10/16/17 2002  WBC 8.6 8.3  RBC 3.73* 3.52*  HGB 11.0* 10.6*  HCT 36.9 35.1*  MCV 98.9 99.7  MCH 29.5 30.1  MCHC 29.8* 30.2  RDW 15.5 15.5  PLT 209 198    Cardiac EnzymesNo results for input(s): TROPONINI in the last 168 hours.  Recent Labs  Lab 10/16/17 1815  TROPIPOC 0.02  BNP Recent Labs  Lab 10/16/17 2002  BNP 813.7*     DDimer No results for input(s): DDIMER in the last 168 hours.   Radiology    No results found.  Cardiac Studies     Patient Profile     84 y.o. female admitted with rapid atrial for ablation and wheezing.  Assessment & Plan    1.  Atrial fibrillation: She is scheduled for cardioversion tomorrow.  She has not missed any doses  of her Eliquis.   2.  Pulmonary: She is continues to wheeze.  She is on duo nebs twice a day and also Pulmicort twice a day.  To new current medications.  3.  Chronic renal insufficiency: Creatinine is slightly better after holding her Lasix for several days.  We have also stop potassium.  For questions or updates, please contact [place] Please consult [email or web address] for contact info under Cardiology/STEMI.      Signed, Mertie Moores, MD  10/19/2017, 3:22 PM

## 2017-10-19 NOTE — Plan of Care (Signed)
  Problem: Education: Goal: Knowledge of General Education information will improve Description Including pain rating scale, medication(s)/side effects and non-pharmacologic comfort measures Outcome: Adequate for Discharge   Problem: Health Behavior/Discharge Planning: Goal: Ability to manage health-related needs will improve Outcome: Adequate for Discharge   

## 2017-10-20 ENCOUNTER — Inpatient Hospital Stay (HOSPITAL_COMMUNITY): Payer: Medicare Other | Admitting: Certified Registered"

## 2017-10-20 ENCOUNTER — Encounter (HOSPITAL_COMMUNITY): Payer: Self-pay | Admitting: *Deleted

## 2017-10-20 ENCOUNTER — Encounter (HOSPITAL_COMMUNITY)
Admission: EM | Disposition: A | Discharge status: Home / Self Care | Payer: Self-pay | Source: Ambulatory Visit | Attending: Interventional Cardiology

## 2017-10-20 HISTORY — PX: CARDIOVERSION: SHX1299

## 2017-10-20 LAB — BASIC METABOLIC PANEL
ANION GAP: 9 (ref 5–15)
BUN: 30 mg/dL — AB (ref 8–23)
CO2: 27 mmol/L (ref 22–32)
Calcium: 8.9 mg/dL (ref 8.9–10.3)
Chloride: 106 mmol/L (ref 98–111)
Creatinine, Ser: 1.64 mg/dL — ABNORMAL HIGH (ref 0.44–1.00)
GFR calc Af Amer: 30 mL/min — ABNORMAL LOW (ref >60)
GFR calc non Af Amer: 26 mL/min — ABNORMAL LOW (ref >60)
GLUCOSE: 93 mg/dL (ref 70–99)
POTASSIUM: 4.7 mmol/L (ref 3.5–5.1)
Sodium: 142 mmol/L (ref 135–145)

## 2017-10-20 SURGERY — CARDIOVERSION
Anesthesia: General

## 2017-10-20 MED ORDER — IPRATROPIUM-ALBUTEROL 0.5-2.5 (3) MG/3ML IN SOLN
3.0000 mL | Freq: Four times a day (QID) | RESPIRATORY_TRACT | Status: DC
  Administered 2017-10-20 – 2017-10-21 (×3): 3 mL via RESPIRATORY_TRACT
  Filled 2017-10-20 (×3): qty 3

## 2017-10-20 MED ORDER — LIDOCAINE 2% (20 MG/ML) 5 ML SYRINGE
INTRAMUSCULAR | Status: DC | PRN
  Administered 2017-10-20: 60 mg via INTRAVENOUS

## 2017-10-20 MED ORDER — ALBUTEROL SULFATE (2.5 MG/3ML) 0.083% IN NEBU: 3.0000 mL | INHALATION_SOLUTION | Freq: Four times a day (QID) | RESPIRATORY_TRACT | Status: DC | PRN

## 2017-10-20 MED ORDER — PROPOFOL 500 MG/50ML IV EMUL: INTRAVENOUS | Status: DC | PRN

## 2017-10-20 MED ORDER — PROPOFOL 10 MG/ML IV BOLUS
INTRAVENOUS | Status: DC | PRN
  Administered 2017-10-20: 20 mg via INTRAVENOUS
  Administered 2017-10-20: 10 mg via INTRAVENOUS
  Administered 2017-10-20: 40 mg via INTRAVENOUS

## 2017-10-20 MED ORDER — SODIUM CHLORIDE 0.9 % IV SOLN: INTRAVENOUS | Status: DC

## 2017-10-20 MED ORDER — SODIUM CHLORIDE 0.9 % IV SOLN
INTRAVENOUS | Status: DC | PRN
  Administered 2017-10-20: 11:00:00 via INTRAVENOUS

## 2017-10-20 NOTE — Progress Notes (Addendum)
Progress Note  Patient Name: [name] Date of Encounter: 10/20/2017  Primary Cardiologist: Sinclair Grooms, MD   Subjective   Pt feeling better after DCCV today 10/20/17. Denies chest pain or palpitations.   Feels better   Inpatient Medications    Scheduled Meds: . amiodarone  200 mg Oral BID  . apixaban  2.5 mg Oral BID  . atorvastatin  20 mg Oral Daily  . budesonide  0.5 mg Nebulization BID  . calcium carbonate  500 mg of elemental calcium Oral BID WC  . cholecalciferol  1,000 Units Oral Daily  . docusate sodium  100 mg Oral Daily  . fluticasone  1 spray Each Nare Daily  . ipratropium-albuterol  3 mL Nebulization BID  . metoprolol succinate  25 mg Oral BID  . pantoprazole  40 mg Oral Daily  . sodium chloride flush  3 mL Intravenous Q12H   Continuous Infusions: . sodium chloride     PRN Meds: sodium chloride, acetaminophen, ALPRAZolam, nitroGLYCERIN, ondansetron (ZOFRAN) IV, sodium chloride flush, traMADol   Vital Signs    Vitals:   10/20/17 1212 10/20/17 1213 10/20/17 1220 10/20/17 1301  BP:  108/60 (!) 98/48 125/71  Pulse: 63 61 (!) 59 62  Resp: 15 15 (!) 21 18  Temp:  98.6 F (37 C)  98.7 F (37.1 C)  TempSrc:  Oral  Oral  SpO2: 100% 100% 98% 95%  Weight:      Height:        Intake/Output Summary (Last 24 hours) at 10/20/2017 1321 Last data filed at 10/20/2017 1222 Gross per 24 hour  Intake 1210 ml  Output 200 ml  Net 1010 ml   Filed Weights   10/18/17 0518 10/19/17 0536 10/20/17 0505  Weight: 78.1 kg 79.2 kg 79.2 kg    Physical Exam   General: Elderly, NAD Skin: Warm, dry, intact  Head: Normocephalic, atraumatic,  clear, moist mucus membranes. Neck: Negative for carotid bruits. No JVD Lungs: Bilateral anterior wheezing, diffuse crackles in posterior lobes. Breathing is unlabored. Cardiovascular: RRR with S1 S2. No murmurs, rubs, gallops, or LV heave appreciated. Abdomen: Soft, non-tender, non-distended with normoactive bowel sounds.  No obvious abdominal masses. MSK: Strength and tone appear normal for age. 5/5 in all extremities Extremities: No edema. No clubbing or cyanosis. DP/PT pulses 2+ bilaterally Neuro: Alert and oriented. No focal deficits. No facial asymmetry. MAE spontaneously. Psych: Responds to questions appropriately with normal affect.    Labs    Chemistry Recent Labs  Lab 10/16/17 2002  10/18/17 0419 10/19/17 0440 10/20/17 0608  NA 143   < > 140 142 142  K 4.4   < > 4.5 4.7 4.7  CL 109   < > 106 106 106  CO2 24   < > 29 27 27   GLUCOSE 96   < > 95 99 93  BUN 24*   < > 30* 32* 30*  CREATININE 1.58*   < > 2.11* 1.81* 1.64*  CALCIUM 8.8*   < > 8.5* 8.8* 8.9  PROT 5.9*  --   --   --   --   ALBUMIN 3.3*  --   --   --   --   AST 15  --   --   --   --   ALT 11  --   --   --   --   ALKPHOS 60  --   --   --   --   BILITOT  1.0  --   --   --   --   GFRNONAA 27*   < > 19* 23* 26*  GFRAA 32*   < > 22* 27* 30*  ANIONGAP 10   < > 5 9 9    < > = values in this interval not displayed.     Hematology Recent Labs  Lab 10/16/17 1803 10/16/17 2002  WBC 8.6 8.3  RBC 3.73* 3.52*  HGB 11.0* 10.6*  HCT 36.9 35.1*  MCV 98.9 99.7  MCH 29.5 30.1  MCHC 29.8* 30.2  RDW 15.5 15.5  PLT 209 198    Cardiac EnzymesNo results for input(s): TROPONINI in the last 168 hours.  Recent Labs  Lab 10/16/17 1815  TROPIPOC 0.02     BNP Recent Labs  Lab 10/16/17 2002  BNP 813.7*    DDimer No results for input(s): DDIMER in the last 168 hours.   Radiology    No results found.  Telemetry    10/20/17 Atrial paced/AV paced - Personally Reviewed  ECG    10/16/17 AF with HR 104- Personally Reviewed  Cardiac Studies   Echocardiogram 10/19/17:  Study Conclusions  - Left ventricle: The cavity size was normal. Wall thickness was   normal. Systolic function was normal. The estimated ejection   fraction was in the range of 50% to 55%. Wall motion was normal;   there were no regional wall motion  abnormalities. The study is   not technically sufficient to allow evaluation of LV diastolic   function. - Mitral valve: Mildly thickened leaflets . There was mild to   moderate regurgitation. - Left atrium: Severely dilated. - Right ventricle: Poorly visualized. Pacer wire or catheter noted   in right ventricle. - Right atrium: The atrium was mildly dilated. Pacer wire or   catheter noted in right atrium. - Tricuspid valve: There was mild regurgitation. - Pulmonary arteries: PA peak pressure: 18 mm Hg (S). - Inferior vena cava: The vessel was normal in size. The   respirophasic diameter changes were in the normal range (>= 50%),   consistent with normal central venous pressure.  Impressions:  - Compared to a prior study in 2016, the LVEF is lower at 50-55%.   There is severe LAE and mild to moderate MR.  Patient Profile     84 y.o. female with a history of CAD, chronic diastolic heart failure, stage III chronic kidney disease, sick sinus syndrome with paroxysmal atrial fibrillation and bradycardia, permanent pacemaker therapy, chronic therapy with amiodarone, and COPD.  She gives a 2-week history of progressive lower extremity swelling and dyspnea.   Assessment & Plan    1.  Paroxysmal atrial fibrillation:  -Pt underwent successful DCCV today to AV pacing with one shock  -Amiodarone increased to 200mg  twice daily on admission  -Metoprolol XL 25mg  twice daily, monitor BP closely -Continue low dose Eliquis 2.5mg  twice daily   2.  Progressive wheezing and dyspnea:  -Continues to have anterior wheezing  -Duo nebs twice a day with Pulmicort twice a day  3.  Chronic renal insufficiency: -Creatinine, 1.64 today>>improved from 1.81 yesterday -Lasix is currently on hold and K has been stopped  after holding her Lasix for several days.  4. Acute on chronic diastolic heart failure: -Likely secondary to AF -Lasix on hold secondary to renal insufficiency   5. S/p PPM: -For  SSS/tachybrady syndrome -Stable   Signed, Kathyrn Drown NP-C [facility] Pager: [phone] 10/20/2017, 1:21 PM     For questions or updates,  please contact   Please consult [email or web address] for contact info under Cardiology/STEMI.  Attending Note:   The patient was seen and examined.  Agree with assessment and plan as noted above.  Changes made to the above note as needed.  Patient seen and independently examined with  Kathyrn Drown, NP .   We discussed all aspects of the encounter. I agree with the assessment and plan as stated above.  1.  Atrial fibrillation: She is status post cardioversion.  Heart rate stable.  2.  Wheezing: She continues to wheeze.  She is on nebulizers twice a day.   I discussed with Dr. Zachery Dakins ( [facility] ) .   She suggested increasing the duoneb to 4 times a day . Will consider adding steroids if she is not better by tomorrow.   May need to consider a pulmonary consultation.  Chronic kidney disease: Creatinine continues to improve.   I have spent a total of 40 minutes with patient reviewing hospital  notes , telemetry, EKGs, labs and examining patient as well as establishing an assessment and plan that was discussed with the patient. > 50% of time was spent in direct patient care.    Thayer Headings, Brooke Bonito., MD, [facility] 10/20/2017, 2:24 PM [address]  [place] Pager [phone]

## 2017-10-20 NOTE — Interval H&P Note (Signed)
History and Physical Interval Note:  10/20/2017 11:37 AM  [name]  has presented today for surgery, with the diagnosis of a fib  The various methods of treatment have been discussed with the patient and family. After consideration of risks, benefits and other options for treatment, the patient has consented to  Procedure(s): CARDIOVERSION (N/A) as a surgical intervention .  The patient's history has been reviewed, patient examined, no change in status, stable for surgery.  I have reviewed the patient's chart and labs.  Questions were answered to the patient's satisfaction.     Jenkins Rouge

## 2017-10-20 NOTE — Progress Notes (Signed)
Patient remains up in recliner chair with no c/o. Ambulates to bathroom independently.   Alert and oriented with no c/o.

## 2017-10-20 NOTE — Progress Notes (Signed)
Patient returned from cath lab from cardioversion awake and oriented. Family in attendance. Pt talkative, requesting lunch, applying lipstick. No c/o discomfort.  Up in recliner chair with call bell at side.

## 2017-10-20 NOTE — Anesthesia Postprocedure Evaluation (Signed)
Anesthesia Post Note  Patient: [name]  Procedure(s) Performed: CARDIOVERSION (N/A )     Patient location during evaluation: PACU Anesthesia Type: General Level of consciousness: awake and alert Pain management: pain level controlled Vital Signs Assessment: post-procedure vital signs reviewed and stable Respiratory status: spontaneous breathing, nonlabored ventilation, respiratory function stable and patient connected to nasal cannula oxygen Cardiovascular status: blood pressure returned to baseline and stable Postop Assessment: no apparent nausea or vomiting Anesthetic complications: no    Last Vitals:  Vitals:   10/20/17 1220 10/20/17 1301  BP: (!) 98/48 125/71  Pulse: (!) 59 62  Resp: (!) 21 18  Temp:    SpO2: 98% 95%    Last Pain:  Vitals:   10/20/17 1222  TempSrc:   PainSc: 0-No pain                 Chelsey L Woodrum

## 2017-10-20 NOTE — Transfer of Care (Signed)
Immediate Anesthesia Transfer of Care Note  Patient: [name]  Procedure(s) Performed: CARDIOVERSION (N/A )  Patient Location: PACU  Anesthesia Type:General  Level of Consciousness: drowsy  Airway & Oxygen Therapy: Patient Spontanous Breathing and Patient connected to face mask oxygen  Post-op Assessment: Report given to RN and Post -op Vital signs reviewed and stable  Post vital signs: Reviewed and stable  Last Vitals:  Vitals Value Taken Time  BP    Temp    Pulse    Resp    SpO2      Last Pain:  Vitals:   10/20/17 1101  TempSrc: Oral  PainSc: 0-No pain         Complications: No apparent anesthesia complications

## 2017-10-20 NOTE — CV Procedure (Signed)
[facility]: Anesthesia: Propofol Dr Jasmine December On Rx anticoagulation with no missed doses  [place]ton x 1 converted from afib rate 80 to AV pacing rate 64 No immediate neurologic sequelae  Jenkins Rouge

## 2017-10-20 NOTE — Anesthesia Preprocedure Evaluation (Addendum)
Anesthesia Evaluation  Patient identified by MRN, date of birth, ID band Patient awake    Reviewed: Allergy & Precautions, NPO status , Patient's Chart, lab work & pertinent test results, reviewed documented beta blocker date and time   Airway Mallampati: II  TM Distance: >3 FB Neck ROM: Full    Dental no notable dental hx. (+) Dental Advisory Given, Teeth Intact   Pulmonary COPD, former smoker,    Pulmonary exam normal breath sounds clear to auscultation       Cardiovascular hypertension, Pt. on medications and Pt. on home beta blockers + CAD, + Past MI and +CHF  Normal cardiovascular exam+ dysrhythmias Atrial Fibrillation + pacemaker  Rhythm:Irregular Rate:Normal  TTE 10/19/17 - Left ventricle: The estimated ejection   fraction was in the range of 50% to 55%. - Mitral valve: Mildly thickened leaflets . There was mild to   moderate regurgitation. - Left atrium: Severely dilated. - Right ventricle: Poorly visualized. Pacer wire or catheter noted in right ventricle. - Right atrium: The atrium was mildly dilated. Pacer wire or catheter noted in right atrium. - Tricuspid valve: There was mild regurgitation. - Pulmonary arteries: PA peak pressure: 18 mm Hg (S). - Inferior vena cava: The vessel was normal in size. The   respirophasic diameter changes were in the normal range (>= 50%), consistent with normal central venous pressure.    Neuro/Psych Anxiety negative neurological ROS     GI/Hepatic Neg liver ROS, GERD  ,  Endo/Other  negative endocrine ROS  Renal/GU CRFRenal disease  negative genitourinary   Musculoskeletal  (+) Arthritis ,   Abdominal   Peds  Hematology  (+) anemia ,   Anesthesia Other Findings   Reproductive/Obstetrics                            Anesthesia Physical Anesthesia Plan  ASA: III  Anesthesia Plan: General   Post-op Pain Management:    Induction:  Intravenous  PONV Risk Score and Plan: 3 and Propofol infusion  Airway Management Planned: Natural Airway and Simple Face Mask  Additional Equipment:   Intra-op Plan:   Post-operative Plan:   Informed Consent: I have reviewed the patients History and Physical, chart, labs and discussed the procedure including the risks, benefits and alternatives for the proposed anesthesia with the patient or authorized representative who has indicated his/her understanding and acceptance.   Dental advisory given  Plan Discussed with: CRNA  Anesthesia Plan Comments:         Anesthesia Quick Evaluation

## 2017-10-21 LAB — BASIC METABOLIC PANEL
Anion gap: 9 (ref 5–15)
BUN: 36 mg/dL — AB (ref 8–23)
CHLORIDE: 106 mmol/L (ref 98–111)
CO2: 26 mmol/L (ref 22–32)
CREATININE: 1.58 mg/dL — AB (ref 0.44–1.00)
Calcium: 8.8 mg/dL — ABNORMAL LOW (ref 8.9–10.3)
GFR, EST AFRICAN AMERICAN: 32 mL/min — AB (ref >60)
GFR, EST NON AFRICAN AMERICAN: 27 mL/min — AB (ref >60)
Glucose, Bld: 90 mg/dL (ref 70–99)
POTASSIUM: 4.2 mmol/L (ref 3.5–5.1)
SODIUM: 141 mmol/L (ref 135–145)

## 2017-10-21 MED ORDER — FUROSEMIDE 20 MG PO TABS: 20.0000 mg | ORAL_TABLET | Freq: Every day | ORAL | 3 refills | Status: DC

## 2017-10-21 MED ORDER — METOPROLOL SUCCINATE ER 25 MG PO TB24: 25.0000 mg | ORAL_TABLET | Freq: Two times a day (BID) | ORAL | 3 refills | Status: DC

## 2017-10-21 MED ORDER — APIXABAN 2.5 MG PO TABS: 2.5000 mg | ORAL_TABLET | Freq: Two times a day (BID) | ORAL | 10 refills | Status: DC

## 2017-10-21 MED ORDER — AMIODARONE HCL 200 MG PO TABS: 200.0000 mg | ORAL_TABLET | Freq: Two times a day (BID) | ORAL | 3 refills | Status: DC

## 2017-10-21 NOTE — Progress Notes (Signed)
Discharge instructions given to patient and all questions answered. She said she will eat lunch before leaving.

## 2017-10-21 NOTE — Progress Notes (Signed)
Progress Note  Patient Name: [name] Date of Encounter: 10/21/2017  Primary Cardiologist: Sinclair Grooms, MD   Subjective   No complaints  Inpatient Medications    Scheduled Meds: . amiodarone  200 mg Oral BID  . apixaban  2.5 mg Oral BID  . atorvastatin  20 mg Oral Daily  . budesonide  0.5 mg Nebulization BID  . calcium carbonate  500 mg of elemental calcium Oral BID WC  . cholecalciferol  1,000 Units Oral Daily  . docusate sodium  100 mg Oral Daily  . fluticasone  1 spray Each Nare Daily  . ipratropium-albuterol  3 mL Nebulization Q6H  . metoprolol succinate  25 mg Oral BID  . pantoprazole  40 mg Oral Daily  . sodium chloride flush  3 mL Intravenous Q12H   Continuous Infusions: . sodium chloride     PRN Meds: sodium chloride, acetaminophen, albuterol, ALPRAZolam, nitroGLYCERIN, ondansetron (ZOFRAN) IV, sodium chloride flush, traMADol   Vital Signs    Vitals:   10/20/17 2000 10/21/17 0353 10/21/17 0725 10/21/17 0727  BP:  (!) 108/56    Pulse:  61    Resp:  18    Temp:  97.7 F (36.5 C)    TempSrc:  Oral    SpO2: 96% 94% 95% 94%  Weight:  78.3 kg    Height:        Intake/Output Summary (Last 24 hours) at 10/21/2017 0858 Last data filed at 10/21/2017 0600 Gross per 24 hour  Intake 610 ml  Output 800 ml  Net -190 ml   Filed Weights   10/19/17 0536 10/20/17 0505 10/21/17 0353  Weight: 79.2 kg 79.2 kg 78.3 kg    Telemetry    AV pacing  - Personally Reviewed  ECG    AV pacing  - Personally Reviewed  Physical Exam  Obese elderly white female  GEN: No acute distress.   Neck: No JVD Cardiac: RRR, no murmurs, rubs, or gallops. Pacer under left clavicle  Respiratory: mild expiratory wheezing  bilaterally. GI: Soft, nontender, non-distended  MS: No edema; No deformity. Neuro:  Nonfocal  Psych: Normal affect   Labs    Chemistry Recent Labs  Lab 10/16/17 2002  10/19/17 0440 10/20/17 0608 10/21/17 0321  NA 143   < > 142 142 141  K 4.4    < > 4.7 4.7 4.2  CL 109   < > 106 106 106  CO2 24   < > 27 27 26   GLUCOSE 96   < > 99 93 90  BUN 24*   < > 32* 30* 36*  CREATININE 1.58*   < > 1.81* 1.64* 1.58*  CALCIUM 8.8*   < > 8.8* 8.9 8.8*  PROT 5.9*  --   --   --   --   ALBUMIN 3.3*  --   --   --   --   AST 15  --   --   --   --   ALT 11  --   --   --   --   ALKPHOS 60  --   --   --   --   BILITOT 1.0  --   --   --   --   GFRNONAA 27*   < > 23* 26* 27*  GFRAA 32*   < > 27* 30* 32*  ANIONGAP 10   < > 9 9 9    < > = values in this interval not displayed.  Hematology Recent Labs  Lab 10/16/17 1803 10/16/17 2002  WBC 8.6 8.3  RBC 3.73* 3.52*  HGB 11.0* 10.6*  HCT 36.9 35.1*  MCV 98.9 99.7  MCH 29.5 30.1  MCHC 29.8* 30.2  RDW 15.5 15.5  PLT 209 198    Cardiac EnzymesNo results for input(s): TROPONINI in the last 168 hours.  Recent Labs  Lab 10/16/17 1815  TROPIPOC 0.02     BNP Recent Labs  Lab 10/16/17 2002  BNP 813.7*      Radiology    No results found.  Cardiac Studies   Echo:  EF 50-55%   Patient Profile     84 y.o. female with PAF. PPM for SSS post [facility] 10/20/17   Assessment & Plan    PAF: maitaining NSR post [facility] 10/20/17 contiue amiodarone and eliquis Pulmonary:  Asthmatic COPD see adjustments to inhalers consider outpatient pulmonary consult CKD:  CR stable since admission   Ok to d/c home TOC visit PA and f/u Dr [name]   For questions or updates, please contact [facility] Please consult [email or web address] for contact info under Cardiology/STEMI.      Signed, Jenkins Rouge, MD  10/21/2017, 8:58 AM

## 2017-10-21 NOTE — Discharge Summary (Signed)
Discharge Summary    Patient ID: [name],  MRN: [id number], DOB/AGE: 05-02-1923 84 y.o.  Admit date: 10/16/2017 Discharge date: 10/21/2017  Primary Care Provider: McDiarmid, Blane Ohara Primary Cardiologist: Sinclair Grooms, MD  Discharge Diagnoses    Active Problems:   Persistent atrial fibrillation [facility])   Acute on chronic diastolic heart failure (HCC)   Allergies Allergies  Allergen Reactions  . Baclofen Other (See Comments)    Confusion occurred with taking 3 at the same time  . Codeine Phosphate Nausea Only  . Hydrochlorothiazide Other (See Comments)    Possible acute gout or arthritis of wrist  . Montelukast Sodium Other (See Comments)    Reaction not recalled ??  Lebron Quam [Hydrocodone-Acetaminophen] Nausea And Vomiting    Diagnostic Studies/Procedures   2D Echo 10/19/17 Study Conclusions  - Left ventricle: The cavity size was normal. Wall thickness was   normal. Systolic function was normal. The estimated ejection   fraction was in the range of 50% to 55%. Wall motion was normal;   there were no regional wall motion abnormalities. The study is   not technically sufficient to allow evaluation of LV diastolic   function. - Mitral valve: Mildly thickened leaflets . There was mild to   moderate regurgitation. - Left atrium: Severely dilated. - Right ventricle: Poorly visualized. Pacer wire or catheter noted   in right ventricle. - Right atrium: The atrium was mildly dilated. Pacer wire or   catheter noted in right atrium. - Tricuspid valve: There was mild regurgitation. - Pulmonary arteries: PA peak pressure: 18 mm Hg (S). - Inferior vena cava: The vessel was normal in size. The   respirophasic diameter changes were in the normal range (>= 50%),   consistent with normal central venous pressure.  Impressions:  - Compared to a prior study in 2016, the LVEF is lower at 50-55%.   There is severe LAE and mild to moderate MR.   History of Present Illness      84 y.o. female with a history of CAD, chronic diastolic heart failure, stage III chronic kidney disease, sick sinus syndrome with paroxysmal atrial fibrillation and bradycardia, permanent pacemaker therapy, chronic therapy with amiodarone, chronic anticoagulation with Eliquis and COPD. She presented for evaluation on 10/16/17 and endorsed a 2-week history of progressive lower extremity swelling and dyspnea. She was noted to be back in atrial fibrillation and in acute on chronic CHF. Subsequently, she was admitted to [facility] for management of atrial fibrillation and CHF.    Hospital Course     Pt was admitted to telemetry. Metoprolol dose was increased from 25 to 50 mg daily for added rate control. Amiodarone was also increased to 200 mg BID. Eliquis was continued and pt endorsed full compliance w/ anticoagulation w/o any missed doses. She was placed on IV diuretics w/ Lasix, 40 mg BID. She had good diuresis with Lasix and volume improved. She did develop an AKI with SCr spiking to 1.81. Lasix was held and renal function gradually improved down from 1.81 to 1.64>>1.58.   On 10/20/17, she underwent successful DCCV for atrial fibrillation. She converted after 1 shock. She was monitored post cardioversion and had no complications.   On 10/21/17, she was seen and examined by Dr. Johnsie Cancel who felt that she was stable for discharge home. She was maintaining NSR on tele. She was euvolemic and symptoms resolved. SCr improved to 1.58. Vital signs stable. It was recommended that she continue her previous home lasix  dose. She was also continued on amiodarone and Eliquis. She will need a repeat BMP in 1 week to reassess renal function and will need a repeat EKG to see if she is still maintaining NSR. TOC f/u will be arranged in 7-10 days.    Consultants: none    Discharge Vitals Blood pressure (!) 105/51, pulse 65, temperature 97.7 F (36.5 C), temperature source Oral, resp. rate 18, height 5\' 5"  (1.651 m), weight  78.3 kg, SpO2 94 %.  Filed Weights   10/19/17 0536 10/20/17 0505 10/21/17 0353  Weight: 79.2 kg 79.2 kg 78.3 kg    Labs & Radiologic Studies    CBC No results for input(s): WBC, NEUTROABS, HGB, HCT, MCV, PLT in the last 72 hours. Basic Metabolic Panel Recent Labs    10/20/17 0608 10/21/17 0321  NA 142 141  K 4.7 4.2  CL 106 106  CO2 27 26  GLUCOSE 93 90  BUN 30* 36*  CREATININE 1.64* 1.58*  CALCIUM 8.9 8.8*   Liver Function Tests No results for input(s): AST, ALT, ALKPHOS, BILITOT, PROT, ALBUMIN in the last 72 hours. No results for input(s): LIPASE, AMYLASE in the last 72 hours. Cardiac Enzymes No results for input(s): CKTOTAL, CKMB, CKMBINDEX, TROPONINI in the last 72 hours. BNP Invalid input(s): POCBNP D-Dimer No results for input(s): DDIMER in the last 72 hours. Hemoglobin A1C No results for input(s): HGBA1C in the last 72 hours. Fasting Lipid Panel No results for input(s): CHOL, HDL, LDLCALC, TRIG, CHOLHDL, LDLDIRECT in the last 72 hours. Thyroid Function Tests No results for input(s): TSH, T4TOTAL, T3FREE, THYROIDAB in the last 72 hours.  Invalid input(s): FREET3 _____________  Dg Chest 2 View  Result Date: 10/17/2017 CLINICAL DATA:  CHF. EXAM: CHEST - 2 VIEW COMPARISON:  10/16/2017.  08/16/2016. FINDINGS: Cardiac pacer with lead tip over the right atrium right ventricle. Stable cardiomegaly. Mild left base subsegmental atelectasis and/or scarring. No pneumothorax. Surgical clips upper abdomen. IMPRESSION: 1. Cardiac pacer noted with lead tips over the right atrium right ventricle. Stable cardiomegaly. 2. Mild left base atelectasis and or scarring. Chest appears stable from prior exam. Electronically Signed   By: Marcello Moores  Register   On: 10/17/2017 07:19   Dg Chest 2 View  Result Date: 10/16/2017 CLINICAL DATA:  Short of breath EXAM: CHEST - 2 VIEW COMPARISON:  08/30/2017, 08/24/2017, 08/16/2016 FINDINGS: Right-sided central pacing device with leads over the right  atrium and right ventricle. No significant pleural effusion. Mild cardiomegaly with vascular congestion. Aortic atherosclerosis. No pneumothorax. Stable scarring at the lingula. IMPRESSION: No active cardiopulmonary disease. Cardiomegaly with mild central vascular congestion. Electronically Signed   By: Donavan Foil M.D.   On: 10/16/2017 18:37   Disposition   Pt is being discharged home today in good condition.  Follow-up Plans & Appointments    Follow-up Information    Belva Crome, MD Follow up.   Specialty:  Cardiology Why:  our office will call you with a hospital follow-up visit with Dr. [name] or his PA/NP in 1-2 weeks  Contact information: [address] [address] [phone]          Discharge Instructions    (HEART FAILURE PATIENTS) Call MD:  Anytime you have any of the following symptoms: 1) 3 pound weight gain in 24 hours or 5 pounds in 1 week 2) shortness of breath, with or without a dry hacking cough 3) swelling in the hands, feet or stomach 4) if you have to sleep  on extra pillows at night in order to breathe.   Complete by:  As directed    Diet - low sodium heart healthy   Complete by:  As directed    Diet - low sodium heart healthy   Complete by:  As directed    Increase activity slowly   Complete by:  As directed    Increase activity slowly   Complete by:  As directed       Discharge Medications   Allergies as of 10/21/2017      Reactions   Baclofen Other (See Comments)   Confusion occurred with taking 3 at the same time   Codeine Phosphate Nausea Only   Hydrochlorothiazide Other (See Comments)   Possible acute gout or arthritis of wrist   Montelukast Sodium Other (See Comments)   Reaction not recalled ??   Norco [hydrocodone-acetaminophen] Nausea And Vomiting      Medication List    STOP taking these medications   aspirin 81 MG chewable tablet     TAKE these medications   VENTOLIN HFA 108 (90 Base) MCG/ACT  inhaler Generic drug:  albuterol Inhale 2 puffs into the lungs every 6 (six) hours as needed for wheezing or shortness of breath.   albuterol (2.5 MG/3ML) 0.083% nebulizer solution Commonly known as:  PROVENTIL Take 3 mLs (2.5 mg total) by nebulization every 4 (four) hours as needed for up to 7 days for wheezing or shortness of breath.   amiodarone 200 MG tablet Commonly known as:  PACERONE Take 1 tablet (200 mg total) by mouth 2 (two) times daily. What changed:  when to take this   apixaban 2.5 MG Tabs tablet Commonly known as:  ELIQUIS Take 1 tablet (2.5 mg total) by mouth 2 (two) times daily. What changed:  how much to take   atorvastatin 20 MG tablet Commonly known as:  LIPITOR TAKE 1 TABLET BY MOUTH DAILY   budesonide 0.5 MG/2ML nebulizer solution Commonly known as:  PULMICORT Take 2 mLs (0.5 mg total) by nebulization 2 (two) times daily.   fluticasone 50 MCG/ACT nasal spray Commonly known as:  FLONASE SHAKE LIQUID AND USE 2 SPRAYS IN EACH NOSTRIL DAILY What changed:  See the new instructions.   furosemide 20 MG tablet Commonly known as:  LASIX Take 1 tablet (20 mg total) by mouth daily. What changed:    how much to take  how to take this  when to take this  additional instructions   ipratropium-albuterol 0.5-2.5 (3) MG/3ML Soln Commonly known as:  DUONEB USE 3 ML VIA NEBULIZER EVERY 4 HOURS What changed:  See the new instructions.   metoprolol succinate 25 MG 24 hr tablet Commonly known as:  TOPROL-XL Take 1 tablet (25 mg total) by mouth 2 (two) times daily. What changed:  See the new instructions.   nitroGLYCERIN 0.4 MG SL tablet Commonly known as:  NITROSTAT Place 1 tablet (0.4 mg total) under the tongue every 5 (five) minutes as needed for chest pain (x 3 tabs).   omeprazole 20 MG capsule Commonly known as:  PRILOSEC Take 1 capsule (20 mg total) by mouth daily.   traMADol 50 MG tablet Commonly known as:  ULTRAM TAKE 1 TABLET BY MOUTH EVERY 6  HOURS AS NEEDED FOR PAIN What changed:    reasons to take this  additional instructions   TRELEGY ELLIPTA 100-62.5-25 MCG/INH Aepb Generic drug:  Fluticasone-Umeclidin-Vilant Inhale 1 puff into the lungs daily.   vitamin B-12 1000 MCG tablet Commonly known as:  CYANOCOBALAMIN Take 1 tablet (1,000 mcg total) by mouth daily.        Acute coronary syndrome (MI, NSTEMI, STEMI, etc) this admission?: No.    Outstanding Labs/Studies   F/u BMP in 7 days. Office EKG at time of TOC f/u.   Duration of Discharge Encounter   Greater than 30 minutes including physician time.  Signed, Lyda Jester, PA-C 10/21/2017, 9:54 AM

## 2017-10-23 ENCOUNTER — Ambulatory Visit (INDEPENDENT_AMBULATORY_CARE_PROVIDER_SITE_OTHER): Payer: Medicare Other | Admitting: Internal Medicine

## 2017-10-23 ENCOUNTER — Encounter: Payer: Self-pay | Admitting: Internal Medicine

## 2017-10-23 VITALS — BP 110/72 | HR 74 | Ht 65.0 in | Wt 175.2 lb

## 2017-10-23 DIAGNOSIS — I480 Paroxysmal atrial fibrillation: Secondary | ICD-10-CM | POA: Diagnosis not present

## 2017-10-23 DIAGNOSIS — I495 Sick sinus syndrome: Secondary | ICD-10-CM

## 2017-10-23 DIAGNOSIS — J449 Chronic obstructive pulmonary disease, unspecified: Secondary | ICD-10-CM

## 2017-10-23 DIAGNOSIS — Z950 Presence of cardiac pacemaker: Secondary | ICD-10-CM | POA: Diagnosis not present

## 2017-10-23 MED ORDER — AMIODARONE HCL 200 MG PO TABS: ORAL_TABLET | ORAL | 3 refills | Status: DC

## 2017-10-23 NOTE — Progress Notes (Signed)
HPI Ms. Waskey returns today for followup. She is a pleasant 84 yo woman with a h/o PAF who went out of rhythm and went to the hospital and was cardioverted. She has been on higher dose lasix. She mostly been able to maintain NSR. She has a sore on her leg but is otherwise feeling well.  Allergies  Allergen Reactions  . Baclofen Other (See Comments)    Confusion occurred with taking 3 at the same time  . Codeine Phosphate Nausea Only  . Hydrochlorothiazide Other (See Comments)    Possible acute gout or arthritis of wrist  . Montelukast Sodium Other (See Comments)    Reaction not recalled ??  Lebron Quam [Hydrocodone-Acetaminophen] Nausea And Vomiting     Current Outpatient Medications  Medication Sig Dispense Refill  . albuterol (VENTOLIN HFA) 108 (90 Base) MCG/ACT inhaler Inhale 2 puffs into the lungs every 6 (six) hours as needed for wheezing or shortness of breath.    Marland Kitchen apixaban (ELIQUIS) 2.5 MG TABS tablet Take 1 tablet (2.5 mg total) by mouth 2 (two) times daily. 60 tablet 10  . atorvastatin (LIPITOR) 20 MG tablet TAKE 1 TABLET BY MOUTH DAILY 90 tablet 3  . budesonide (PULMICORT) 0.5 MG/2ML nebulizer solution Take 2 mLs (0.5 mg total) by nebulization 2 (two) times daily. 120 mL 12  . fluticasone (FLONASE) 50 MCG/ACT nasal spray SHAKE LIQUID AND USE 2 SPRAYS IN EACH NOSTRIL DAILY 48 g 3  . Fluticasone-Umeclidin-Vilant (TRELEGY ELLIPTA) 100-62.5-25 MCG/INH AEPB Inhale 1 puff into the lungs daily.    . furosemide (LASIX) 20 MG tablet Take 1 tablet (20 mg total) by mouth daily. 90 tablet 3  . ipratropium-albuterol (DUONEB) 0.5-2.5 (3) MG/3ML SOLN USE 3 ML VIA NEBULIZER EVERY 4 HOURS 540 mL 0  . metoprolol tartrate (LOPRESSOR) 50 MG tablet Take 1 tablet by mouth daily.    . nitroGLYCERIN (NITROSTAT) 0.4 MG SL tablet Place 1 tablet (0.4 mg total) under the tongue every 5 (five) minutes as needed for chest pain (x 3 tabs). 25 tablet PRN  . omeprazole (PRILOSEC) 20 MG capsule Take 1  capsule (20 mg total) by mouth daily. 90 capsule 3  . traMADol (ULTRAM) 50 MG tablet TAKE 1 TABLET BY MOUTH EVERY 6 HOURS AS NEEDED FOR PAIN 30 tablet 5  . vitamin B-12 (CYANOCOBALAMIN) 1000 MCG tablet Take 1 tablet (1,000 mcg total) by mouth daily. 90 tablet 3  . albuterol (PROVENTIL) (2.5 MG/3ML) 0.083% nebulizer solution Take 3 mLs (2.5 mg total) by nebulization every 4 (four) hours as needed for up to 7 days for wheezing or shortness of breath. 150 mL 1  . amiodarone (PACERONE) 200 MG tablet Take one tablet by mouth daily Monday through Friday.  Do not take on Saturday or Sunday. 90 tablet 3   No current facility-administered medications for this visit.      Past Medical History:  Diagnosis Date  . Abnormal mammogram, unspecified 08/23/2010   Followup imaging reassuring.   . Acute appendicitis with rupture   . ADENOMATOUS COLONIC POLYP 03/01/2003   Qualifier: Diagnosis of  By: McDiarmid MD, Sherren Mocha Multiple benign polyps of cecum, ascending, transverse and sigmoid colon by 8/09 colonoscopy by Dr Cristina Gong  Colonoscopy by Dr Cristina Gong for iron-deficiency anemia on 04/27/2010 showed three sessile polyps that were in ascending (3 mm x 9 mm), transverse (4 mm), and cecum (3 mm).  All three were tubular adenomas that were negative for high grade dysplasia or malignancy on pathology. Dr  Buccini called the polpys benign and not requiring follow-up in view of the patients age.    . Adrenal adenoma    Incidentaloma  . AF (paroxysmal atrial fibrillation) ([place]) 11/11/2010   Hospitalization (9/8-9/10, Dr Daneen Schick, III, Cardiology) for Paroxysmal Atrial Fibrillation with RVR and anginal pain secondary to demand/supply mismatch in setting of RVR with known circumflex artery branch disease.    Marland Kitchen ANXIETY 04/27/2006   Qualifier: Diagnosis of  By: McDiarmid MD, Sherren Mocha    . Benign essential tremor 02/04/2016  . Candidiasis of the esophagus 11/26/2007  . CAP (community acquired pneumonia) 02/25/2015  . Cataract 2013    Bilateral   . Cellulitis of leg, right 10/01/2012  . Cellulitis of right lower extremity   . Chest pain with moderate risk of acute coronary syndrome 05/21/2015  . Chronic kidney disease (CKD), stage III (moderate) (HCC)   . Concussion with loss of consciousness   . COPD 04/27/2006   Qualifier: Diagnosis of  By: McDiarmid MD, Sherren Mocha    . COPD exacerbation ([place])   . COPD, severe   . Decreased functional mobility and endurance 03/17/2015  . Diastolic heart failure ([place]) 07/30/2015  . DISC WITH RADICULOPATHY 04/27/2006   Qualifier: Diagnosis of  By: McDiarmid MD, Sherren Mocha    . Dyspnea   . EDEMA-LEGS,DUE TO VENOUS OBSTRUCT. 04/27/2006   Qualifier: Diagnosis of  By: McDiarmid MD, Sherren Mocha    . 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 Normal uric acid serum level at time of attack.    Marland Kitchen HERNIA, HIATAL, NONCONGENITAL 04/27/2006   Qualifier: Diagnosis of  By: McDiarmid MD, Sherren Mocha    . High risk medications (not anticoagulants) long-term use 03/05/2012  . History of Hemorrhoids 04/27/2006   Qualifier: Diagnosis of  By: McDiarmid MD, Sherren Mocha    . History of iron deficiency 01/16/2015  . Hx of colonoscopy with polypectomy 04/27/2010   Dr Cristina Gong found three  tubular adenomas each less than 10 mm size  . Hypertension   . Hypotension 07/09/2015  . Iron deficiency anemia 08/05/2010   Dr Cristina Gong (GI) has evaluated with EGD, colonoscopy, and video capsular endoscopy in 2011 & 2012.  All have been unrevealing as to an origin of IDA.  OV with Dr Cristina Gong (10/28/10) assessment of blood in stool per hemoccult and GER. Hbg 12.1 g/dL, MCV 91.8, Ferritin 30 ng/mL. Patient taking on ferrous sulfate tab daily.   EGD on 03/06/12 by Dr Cristina Gong for IDA non-obstructing Schatzki's ring at Gastroesophageal junction, otherwise normal esophagus and stomach.     . Leg cramps 05/29/2012  . Lumbar herniated disc    History of HNP L4/5 in 2003  . Macular degeneration, bilateral  10/04/2010   Right eye is wet MD, the other is dry macular degeneration (ARMD). Pt undergoing some form of vascular endothelial growth factor inhibition intraocular therapy.    . Mild cognitive impairment 10/26/2012   (10/25/12) Failed MiniCog screen  . MUSCLE CRAMPS 03/11/2010   Qualifier: Diagnosis of  By: McDiarmid MD, Sherren Mocha    . Muscle spasm of back 09/24/2013  . Myocardial infarct, old   . Nocturia 10/28/2016  . Numbness and tingling in hands 07/21/2011  . Pacemaker    MDT  . PREDIABETES 09/11/2007   Qualifier: Diagnosis of  By: McDiarmid MD, Sherren Mocha    . Retinal hemorrhage of left eye 06/2010  . RHINITIS, ALLERGIC 04/27/2006   Qualifier: Diagnosis of  By:  McDiarmid MD, Sherren Mocha    . SCHATZKI'S RING, HX OF 11/26/2007   Qualifier: Diagnosis of  By: McDiarmid MD, Sherren Mocha  An EGD was performed by Dr Cristina Gong on 04/27/2010 for iron deficiency anemia. There was a a transient hiatal hernia with Schatzki's ring. Stomach and duodenum were normal. EGD on 03/06/12 by Dr Cristina Gong for IDA non-obstructing Schatzki's ring at Gastroesophageal junction, otherwise normal esophagus and stomach.    . Seborrheic keratosis, right anterior thigh 12/12/2013  . Shoulder pain, left 01/09/2014  . Sick sinus syndrome with tachycardia ([place])    MDT  . Soft tissue injury of foot 05/03/2011  . Solar lentigo 06/15/2012  . Solitary kidney, acquired 05/17/2010   Surgical removal for transitional cell cancer by Tresa Endo, MD ([place]). Surveillance cystoscopy by Dr Alinda Money [facility]) on 10/19/12 without evidence of cystoscopic recurrence. Recommend RTC one year for cystoscopy.   Marland Kitchen Spinal stenosis, lumbar   . Transitional cell carcinoma of ureter, history   . Urge incontinence 12/13/2011   Diagnosed in 10/2011 by Dr Bjorn Loser (Urology)   . VENTRICULAR HYPERTROPHY, LEFT 08/28/2008   Qualifier: Diagnosis of  By: McDiarmid MD, Sherren Mocha    . VITAMIN B12 DEFICIENCY 10/07/2009   Qualifier: Diagnosis of  By: McDiarmid MD, Sherren Mocha  Dx based on a  post-TKR anemia work-up Low normal serum B12 with high Methylmalonic acid and homocysteine level  Vit B12 serum level (10/28/10) > 1500 pg/mL   . Vitamin D deficiency 11/02/2010   Serum vitamin D 25(OH) = 10.9 ng/mL (30 -100) on 10/28/10 c/w Vitamin D deficiency.       ROS:   All systems reviewed and negative except as noted in the HPI.   Past Surgical History:  Procedure Laterality Date  . BREAST SURGERY     breast reduction  . CHOLECYSTECTOMY    . CORONARY ANGIOPLASTY WITH STENT PLACEMENT  2010   BMS RCA, OM2 occluded  . CYSTOSCOPY  09/30/2015   No cystoscopic evidence of uroepithelial neoplasm.  Follow up surveillance cystoscopy in one year  . CYSTOSCOPY  10/13/2017   Dr Raynelle Bring [facility])  . DUPUYTREN CONTRACTURE RELEASE Right 05/22/2014   Procedure: DUPUYTREN RELEASE AND REPAIR AS NECESSARY RIGHT RING FINGER AND MIDDLE FINGER;  Surgeon: Roseanne Kaufman, MD;  Location: [place];  Service: Orthopedics;  Laterality: Right;  . ESOPHAGOGASTRODUODENOSCOPY  04/27/2010   Dr Cristina Gong - found transient H/H & Schatzki's ring  . ESOPHAGOGASTRODUODENOSCOPY ENDOSCOPY  03/06/2012   Dr Cristina Gong - found non-obstuctive Schatzki's ring at [employer] o/w normal EGD.   Marland Kitchen EYE SURGERY     bilateral cataracts  . KNEE ARTHROSCOPY W/ SYNOVECTOMY  11/2009, left knee   Dr Wynelle Link  . NEPHRECTOMY  For transition cell cancer    Dr Tresa Endo, surgeon  . PACEMAKER GENERATOR CHANGE N/A 11/12/2012   Procedure: PACEMAKER GENERATOR CHANGE;  Surgeon: Evans Lance, MD; Medtronic [facility] dual-chamber pacemaker serial number [id number]; Laterality: Right  . PACEMAKER INSERTION  2005   Dr Doreatha Lew  . PACEMAKER LEAD REMOVAL  2005   Removal and reinsertion of atrial and ventricular leads due to migration  . REPLACEMENT TOTAL KNEE  2009, Right knee   Dr Wynelle Link  . REPLACEMENT TOTAL KNEE  05/2009, Left knee   Dr Wynelle Link  . TONSILLECTOMY    . TRIGGER FINGER RELEASE Right 05/22/2014   Procedure: RIGHT HAND A-1 PULLEY  RELEASE ;  Surgeon: Roseanne Kaufman, MD;  Location: [facility];  Service: Orthopedics;  Laterality: Right;  Family History  Problem Relation Age of Onset  . Dementia Mother   . Heart disease Father   . Heart attack Father      Social History   Socioeconomic History  . Marital status: Widowed    Spouse name: Not on file  . Number of children: 4  . Years of education: Not on file  . Highest education level: Not on file  Occupational History  . 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 Financial resource strain: Not on file  . Food insecurity:    Worry: Not on file    Inability: Not on file  . Transportation needs:    Medical: Not on file    Non-medical: Not on file  Tobacco Use  . Smoking status: Former Smoker    Packs/day: 1.00    Types: Cigarettes    Last attempt to quit: 03/02/2009    Years since quitting: 8.6  . Smokeless tobacco: Never Used  Substance and Sexual Activity  . Alcohol use: Yes    Alcohol/week: 13.0 standard drinks    Types: 7 Glasses of wine, 6 Standard drinks or equivalent per week    Comment: 5 oz white wine per day  . Drug use: No  . Sexual activity: Never  Lifestyle  . Physical activity:    Days per week: Not on file    Minutes per session: Not on file  . Stress: Not on file  Relationships  . Social connections:    Talks on phone: Not on file    Gets together: Not on file    Attends religious service: Not on file    Active member of club or organization: Not on file    Attends meetings of clubs or organizations: Not on file    Relationship status: Not on file  . Intimate partner violence:    Fear of current or ex partner: Not on file    Emotionally abused: Not on file    Physically abused: Not on file    Forced sexual activity: Not on file  Other Topics Concern  . 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    Semi-retired Futures trader.     Pt has home nebulizer for inhalation therapies.   Former Smoker   Smoking Status:  quit > 5 years ago      (+) DNR status per discussion with Dr McDiarmid 10/25/12 and reiterated 06/20/13 office visit .   (+) Living Will/Advance Directive   (+) HC-POA: Cecile Sheerer Causey (pt's dgt)      Current Social History 09/22/2016        Patient lives with daughter, [name] (flight attendant), in two level home 09/22/2016   Transportation: Patient has own vehicle 09/22/2016   Important Relationships 4 daughters and 45 yo grandson 09/22/2016    Pets: None 09/22/2016   Education / Work:  College/ Retired Futures trader 09/22/2016   Interests / Fun: Reads 2 books per week, watches TV, entertains 09/22/2016   Current Stressors: 84 yo house in need of outside repairs 09/22/2016   Religious / Personal Beliefs: Joanie Coddington 09/22/2016   Other: Had a wonderful marriage/ Very thankful/ wants to continue traveling/ closest friends are all gone 09/22/2016  Hubbard Hartshorn, RN, BSN                                                                                                    BP 110/72   Pulse 74   Ht 5\' 5"  (1.651 m)   Wt 175 lb 3.2 oz (79.5 kg)   SpO2 93%   BMI 29.15 kg/m   Physical Exam:  Well appearing NAD HEENT: Unremarkable Neck:  No JVD, no thyromegally Lymphatics:  No adenopathy Back:  No CVA tenderness Lungs:  Clear with no wheezes HEART:  Regular rate rhythm, no murmurs, no rubs, no clicks Abd:  soft, positive bowel sounds, no organomegally, no rebound, no guarding Ext:  2 plus pulses, no edema, no cyanosis, no clubbing Skin:  No rashes no nodules Neuro:  CN II through XII intact, motor grossly intact  EKG - nsr  DEVICE  Normal device function.  See PaceArt for details.   Assess/Plan: 1. PAF - she is maintaining NSR. She will reduce her dose of  amiodarone as she is at increase risk for lung problems.  2. Diastolic hear failure - she is class 2. She is encouraged to take her meds and maintain a low sodium. Diet.  3. PPM - her medtronic DDD PM is working normally. We will recheck in several months.  Mikle Bosworth.D.

## 2017-10-23 NOTE — Patient Instructions (Signed)
Medication Instructions:  Your physician has recommended you make the following change in your medication:  1.  Decrease your amiodarone 200 mg tablets- Take one tablet by mouth Monday through Friday.  Do NOT take amiodarone on Saturday or Sunday.  Labwork: None ordered.  Testing/Procedures: None ordered.  Follow-Up: Your physician wants you to follow-up in: 6 months with Dr. Lovena Le.   two months in advance. If you don't receive a letter, please call our office to schedule the follow-up appointment.  Any Other Special Instructions Will Be Listed Below (If Applicable).  If you need a refill on your cardiac medications before your next appointment, please call your pharmacy.

## 2017-10-24 ENCOUNTER — Telehealth: Payer: Self-pay | Admitting: Family Medicine

## 2017-10-24 ENCOUNTER — Ambulatory Visit (INDEPENDENT_AMBULATORY_CARE_PROVIDER_SITE_OTHER): Payer: Medicare Other | Admitting: Family Medicine

## 2017-10-24 ENCOUNTER — Encounter (HOSPITAL_COMMUNITY): Payer: Self-pay | Admitting: Cardiovascular Disease

## 2017-10-24 VITALS — BP 105/65 | HR 78 | Temp 98.3°F | Wt 175.4 lb

## 2017-10-24 DIAGNOSIS — I130 Hypertensive heart and chronic kidney disease with heart failure and stage 1 through stage 4 chronic kidney disease, or unspecified chronic kidney disease: Secondary | ICD-10-CM | POA: Diagnosis not present

## 2017-10-24 DIAGNOSIS — N184 Chronic kidney disease, stage 4 (severe): Secondary | ICD-10-CM | POA: Diagnosis not present

## 2017-10-24 DIAGNOSIS — I5032 Chronic diastolic (congestive) heart failure: Secondary | ICD-10-CM

## 2017-10-24 DIAGNOSIS — J441 Chronic obstructive pulmonary disease with (acute) exacerbation: Secondary | ICD-10-CM | POA: Diagnosis not present

## 2017-10-24 DIAGNOSIS — I480 Paroxysmal atrial fibrillation: Secondary | ICD-10-CM | POA: Diagnosis not present

## 2017-10-24 DIAGNOSIS — L97921 Non-pressure chronic ulcer of unspecified part of left lower leg limited to breakdown of skin: Secondary | ICD-10-CM

## 2017-10-24 DIAGNOSIS — I2510 Atherosclerotic heart disease of native coronary artery without angina pectoris: Secondary | ICD-10-CM | POA: Diagnosis not present

## 2017-10-24 DIAGNOSIS — I482 Chronic atrial fibrillation: Secondary | ICD-10-CM | POA: Diagnosis not present

## 2017-10-24 NOTE — Telephone Encounter (Signed)
Caryl Pina- [facility] RN calling for verbal orders to see patient 2x week for 3 weeks, 1x week for 3 weeks. Also states patient had inspiratiory and expiratory wheezes at her visit, RR 18, SPO2 95%. Was given duoneb with minimal change. Would like to know if there are any other orders pt may need. (Pt was seen in clinic today and lungs clear bilaterally per note) Call back for VO [phone] Wallace Cullens, RN

## 2017-10-24 NOTE — Progress Notes (Signed)
Patient Name: [name] Date of Birth: [birth year] Date of Visit: 10/24/17 PCP: McDiarmid, Blane Ohara, MD  Chief Complaint: Hospital follow-up  Subjective: [name] is a pleasant 84 y.o.-year-old woman with a history significant for COPD, heart failure With a preserved ejection fraction, atrial fibrillation status post cardioversion, and GERD presenting today for follow-up.  [name] was recently admitted to the hospital.  She was admitted for worsening heart failure with a preserved ejection fraction and symptomatic atrial fibrillation.  She was admitted from August 19-24.  During this time she underwent diuresis with IV Lasix 40 mg twice daily as well as cardioversion.  Her cardioversion was successful she is in sinus rhythm at this time.  She reports overall her symptoms are improved specifically her shortness of breath is improved she does report that her lower extremity edema is starting to return a bit.  Her discharge weight was 174 pounds (78.3 kg).   I reviewed her medications in detail.  She believes that although her medication list says that she is to be taking Lasix 20 mg a day she was told to be taking 2 tablets daily (40 mg daily).  She reports that due to her single kidney she is taking between 1/2 tablet or 1 tablet/day.  She has not yet taken her medications today.  She reports her breathing is markedly improved.  She denies orthopnea or paroxysmal nocturnal dyspnea.  She denies chest pain she does endorse some lower extremity edema.  [name] reports a nearly month history of a nonhealing "blood blister" on her left lateral malleolus.  She denies fever change in the skin coloration, or purulent drainage she has not placed any thing on this area. ROS:  Review of Systems  Constitutional: Negative for fever.  Respiratory: Negative for cough and shortness of breath.   Cardiovascular: Positive for leg swelling. Negative for chest pain, palpitations and PND.  Genitourinary:  Positive for frequency. Negative for urgency.       Due to lasix     I have reviewed the patient's medical, surgical, family, and social history as appropriate.   Vitals:   10/24/17 1034  BP: 105/65  Pulse: 78  Temp: 98.3 F (36.8 C)  SpO2: 96%   Filed Weights   10/24/17 1034  Weight: 175 lb 6.4 oz (79.6 kg)   HEENT: Sclera anicteric. Dentition is moderate. Appears well hydrated. Neck: Supple, JVP 6 cm of water  Cardiac: Regular rate and rhythm. Normal S1/S2. No murmurs, rubs, or gallops appreciated. Lungs: Clear bilaterally to ascultation. No wheezes rales or rhonchi  Abdomen: Normoactive bowel sounds. No tenderness to deep or light palpation. No rebound or guarding.  Extremities: Warm, Bilateral LE with 1+ edema Over left lateral malleolus there is a 1.5 cm healing eschar. There is minimal surrounding erythema. NO drainage, no warmth.  Skin: Warm, dry Psych: Pleasant and appropriate    I personally reviewed the patient's lab results, x-ray findings, and notes from her inpatient hospitalization.  She was discharged on 8/24.  [name] was seen today for hospitalization follow-up.  Diagnoses and all orders for this visit:  Chronic heart failure with preserved ejection fraction ([place]), appears a bit hypervolemic today. She is taking 1/2 to 1 tablet of lasix today. Will clarify dosing with Cardiology.  Paroxysmal atrial fibrillation (HCC), sinus rhythm today, could likely increase Eliquis dosing to 5 mg BID given that her creatinine has been well under 2.5 for sometime. Continue to monitor, consider discussion with PCP at follow  up.   Left Lower Extremity Ulcer, suspect due to combination of venous disease and heart failure causing edema, appears to be healing. Wrapped in clinic today. Provided supplies to wrap again. No signs of infection. RTC precautions reviewed.   Dorris Singh, MD  Family Medicine Teaching Service

## 2017-10-24 NOTE — Telephone Encounter (Signed)
Pete(physical therapist) called stating he went to the pt's home today for in home physical therapy and he is suggesting for the pt to have PT for 3 more weeks. Please call Laurey Arrow at [phone] for these verbal orders.

## 2017-10-24 NOTE — Patient Instructions (Signed)
  Your weight today is 175 pounds  Continue Lasix 20 mg daily   Bring the print out from the hospital stay next week  Call us if your leg swelling gets worse or if you gain 5 pounds in a week or 3 pounds in a day   Call [phone]  It was wonderful to see you today.   Dorris Singh, MD  Family Medicine

## 2017-10-25 ENCOUNTER — Telehealth: Payer: Self-pay | Admitting: Family Medicine

## 2017-10-25 ENCOUNTER — Telehealth: Payer: Self-pay

## 2017-10-25 NOTE — Telephone Encounter (Signed)
Please call in orders 1. Home Health Physical Therapy for 3 more weeks.  2. Home Health RN visits 2 times a week for 3 weeks, then 1 time a week of 3 weeks.    Request HH RN observe patients cardiac and respiratory status and report if patient status worsens.

## 2017-10-25 NOTE — Telephone Encounter (Signed)
LM for Caryl Pina to call back.  Her voicemail doesn't mention if it is secure so I can't leave a detailed message.  Jazmin Hartsell,CMA

## 2017-10-25 NOTE — Telephone Encounter (Signed)
Verbal orders given to Kahi Mohala.  Sadee Osland,CMA

## 2017-10-25 NOTE — Telephone Encounter (Signed)
**Note De-identified  Obfuscation** -----  **Note De-Identified [place] Bejar Obfuscation** Message from [place] sent at 10/25/2017 10:51 AM EDT ----- The f/u appt has been scheduled 9/4    ----- Message ----- From: Dennie Fetters, LPN Sent: 3/44/8301   8:19 AM EDT To: Marcine Matar, [place] Scheduling, #  Please schedule hosp f/u then forward to triage to arrange labs if needed prior to this f/u then to me so I can do the TCM call. Thanks, [employer]

## 2017-10-25 NOTE — Telephone Encounter (Signed)
**Note De-identified  Obfuscation** -----  **Note De-Identified Tallia Moehring Obfuscation** Message from [place] sent at 10/25/2017 10:51 AM EDT ----- The f/u appt has been scheduled 9/4    ----- Message ----- From: Dennie Fetters, LPN Sent: 7/65/4868   8:19 AM EDT To: Marcine Matar, [place] Scheduling, #  Please schedule hosp f/u then forward to triage to arrange labs if needed prior to this f/u then to me so I can do the TCM call. Thanks, [employer]

## 2017-10-25 NOTE — Telephone Encounter (Signed)
Called patient regarding confirmation of Lasix dosing and to check in on swelling. Confirmed Lasix dosing via hospital chart review--- should be 20 mg daily. Her daughter, listed in chart as contact, answered phone, reports [name] has been doing okay, going to grocery store. Patient resting currently, doing well. Left message to call clinic with any concerns or questions.

## 2017-10-25 NOTE — Telephone Encounter (Signed)
LM for ashley to call back.  VO given to [facility] for PT.  Johnney Ou

## 2017-10-25 NOTE — Telephone Encounter (Signed)
**Note De-Identified  Obfuscation** The pt had a f/u with Dr Lovena Le yesterday.

## 2017-10-26 DIAGNOSIS — I5032 Chronic diastolic (congestive) heart failure: Secondary | ICD-10-CM | POA: Diagnosis not present

## 2017-10-26 DIAGNOSIS — I482 Chronic atrial fibrillation: Secondary | ICD-10-CM | POA: Diagnosis not present

## 2017-10-26 DIAGNOSIS — I130 Hypertensive heart and chronic kidney disease with heart failure and stage 1 through stage 4 chronic kidney disease, or unspecified chronic kidney disease: Secondary | ICD-10-CM | POA: Diagnosis not present

## 2017-10-26 DIAGNOSIS — I2510 Atherosclerotic heart disease of native coronary artery without angina pectoris: Secondary | ICD-10-CM | POA: Diagnosis not present

## 2017-10-26 DIAGNOSIS — J441 Chronic obstructive pulmonary disease with (acute) exacerbation: Secondary | ICD-10-CM | POA: Diagnosis not present

## 2017-10-26 DIAGNOSIS — N184 Chronic kidney disease, stage 4 (severe): Secondary | ICD-10-CM | POA: Diagnosis not present

## 2017-10-28 ENCOUNTER — Encounter

## 2017-10-31 ENCOUNTER — Telehealth: Payer: Self-pay

## 2017-10-31 ENCOUNTER — Telehealth: Payer: Self-pay | Admitting: Family Medicine

## 2017-10-31 DIAGNOSIS — I5032 Chronic diastolic (congestive) heart failure: Secondary | ICD-10-CM | POA: Diagnosis not present

## 2017-10-31 DIAGNOSIS — I2510 Atherosclerotic heart disease of native coronary artery without angina pectoris: Secondary | ICD-10-CM | POA: Diagnosis not present

## 2017-10-31 DIAGNOSIS — N184 Chronic kidney disease, stage 4 (severe): Secondary | ICD-10-CM | POA: Diagnosis not present

## 2017-10-31 DIAGNOSIS — J441 Chronic obstructive pulmonary disease with (acute) exacerbation: Secondary | ICD-10-CM | POA: Diagnosis not present

## 2017-10-31 DIAGNOSIS — I482 Chronic atrial fibrillation: Secondary | ICD-10-CM | POA: Diagnosis not present

## 2017-10-31 DIAGNOSIS — I130 Hypertensive heart and chronic kidney disease with heart failure and stage 1 through stage 4 chronic kidney disease, or unspecified chronic kidney disease: Secondary | ICD-10-CM | POA: Diagnosis not present

## 2017-10-31 LAB — CUP PACEART INCLINIC DEVICE CHECK
Battery Impedance: 532 Ohm
Battery Remaining Longevity: 78 mo
Battery Voltage: 2.79 V
Brady Statistic AP VP Percent: 67 %
Brady Statistic AP VS Percent: 29 %
Brady Statistic AS VP Percent: 0 %
Date Time Interrogation Session: 20190826192245
Implantable Lead Implant Date: 20050523
Implantable Lead Model: 5076
Implantable Pulse Generator Implant Date: 20140915
Lead Channel Impedance Value: 473 Ohm
Lead Channel Impedance Value: 786 Ohm
Lead Channel Pacing Threshold Amplitude: 0.75 V
Lead Channel Pacing Threshold Amplitude: 0.75 V
Lead Channel Pacing Threshold Pulse Width: 0.4 ms
Lead Channel Pacing Threshold Pulse Width: 0.4 ms
Lead Channel Setting Pacing Amplitude: 2 V
Lead Channel Setting Pacing Pulse Width: 0.4 ms
Lead Channel Setting Sensing Sensitivity: 5.6 mV
MDC IDC LEAD IMPLANT DT: 20050523
MDC IDC LEAD LOCATION: 753859
MDC IDC LEAD LOCATION: 753860
MDC IDC MSMT LEADCHNL RA SENSING INTR AMPL: 4 mV
MDC IDC MSMT LEADCHNL RV SENSING INTR AMPL: 22.4 mV
MDC IDC SET LEADCHNL RV PACING AMPLITUDE: 2 V
MDC IDC STAT BRADY AS VS PERCENT: 4 %

## 2017-10-31 NOTE — Telephone Encounter (Signed)
Dominica Severin- [facility] called with FYI that patient sustained a fall on Sunday. Was able to get up on her own, did not receive any treatment. Does have abrasion on elbow. C/o swelling in ankle. Has appt here 11/02/17- and home health RN coming in today to evaluate Wallace Cullens, RN

## 2017-10-31 NOTE — Telephone Encounter (Signed)
Will forward to MD as I am not sure what exact orders he will want for her.  [name],CMA

## 2017-10-31 NOTE — Telephone Encounter (Signed)
Please request nurse to place opsite dressing over skin tear and change every 3 days until healed.   [name] should come into [facility] to have ankle examined.

## 2017-10-31 NOTE — Telephone Encounter (Signed)
Mardene Celeste from [facility] is calling to see if an order for an Xray of pt left foot and ankle can be placed for today. Pt fell this weekend. Pt also has new skin tear on right elbow, she would also like to know if there are orders that can be placed to treat the right elbow. Mardene Celeste would like someone to call her at [phone]

## 2017-10-31 NOTE — Telephone Encounter (Signed)
Verbal orders given to Patty.  [name],CMA

## 2017-11-01 ENCOUNTER — Other Ambulatory Visit: Payer: Medicare Other | Admitting: *Deleted

## 2017-11-01 ENCOUNTER — Ambulatory Visit: Payer: Medicare Other | Admitting: Physician Assistant

## 2017-11-01 ENCOUNTER — Telehealth: Payer: Self-pay | Admitting: *Deleted

## 2017-11-01 DIAGNOSIS — Z79899 Other long term (current) drug therapy: Secondary | ICD-10-CM

## 2017-11-01 LAB — BASIC METABOLIC PANEL
BUN/Creatinine Ratio: 23 (ref 12–28)
BUN: 42 mg/dL — ABNORMAL HIGH (ref 10–36)
CO2: 24 mmol/L (ref 20–29)
Calcium: 9.3 mg/dL (ref 8.7–10.3)
Chloride: 100 mmol/L (ref 96–106)
Creatinine, Ser: 1.86 mg/dL — ABNORMAL HIGH (ref 0.57–1.00)
GFR calc Af Amer: 27 mL/min/{1.73_m2} — ABNORMAL LOW (ref >59)
GFR calc non Af Amer: 23 mL/min/{1.73_m2} — ABNORMAL LOW (ref >59)
Glucose: 123 mg/dL — ABNORMAL HIGH (ref 65–99)
POTASSIUM: 4.4 mmol/L (ref 3.5–5.2)
SODIUM: 141 mmol/L (ref 134–144)

## 2017-11-01 NOTE — Progress Notes (Deleted)
Cardiology Office Note    Date:  11/01/2017   ID:  [name], DOB [birth year], MRN [id number]  PCP:  McDiarmid, Blane Ohara, MD  Cardiologist:  ***  Electrophysiologist: ***  Chief Complaint: Hospital follow up s/p    /  Months follow up  History of Present Illness:   [name] is a 84 y.o. female ***   Past Medical History:  Diagnosis Date  . Abnormal mammogram, unspecified 08/23/2010   Followup imaging reassuring.   . Acute appendicitis with rupture   . ADENOMATOUS COLONIC POLYP 03/01/2003   Qualifier: Diagnosis of  By: McDiarmid MD, Sherren Mocha Multiple benign polyps of cecum, ascending, transverse and sigmoid colon by 8/09 colonoscopy by Dr Cristina Gong  Colonoscopy by Dr Cristina Gong for iron-deficiency anemia on 04/27/2010 showed three sessile polyps that were in ascending (3 mm x 9 mm), transverse (4 mm), and cecum (3 mm).  All three were tubular adenomas that were negative for high grade dysplasia or malignancy on pathology. Dr Cristina Gong called the polpys benign and not requiring follow-up in view of the patients age.    . Adrenal adenoma    Incidentaloma  . AF (paroxysmal atrial fibrillation) ([place]) 11/11/2010   Hospitalization (9/8-9/10, Dr Daneen Schick, III, Cardiology) for Paroxysmal Atrial Fibrillation with RVR and anginal pain secondary to demand/supply mismatch in setting of RVR with known circumflex artery branch disease.    Marland Kitchen ANXIETY 04/27/2006   Qualifier: Diagnosis of  By: McDiarmid MD, Sherren Mocha    . Benign essential tremor 02/04/2016  . Candidiasis of the esophagus 11/26/2007  . CAP (community acquired pneumonia) 02/25/2015  . Cataract 2013   Bilateral   . Cellulitis of leg, right 10/01/2012  . Cellulitis of right lower extremity   . Chest pain with moderate risk of acute coronary syndrome 05/21/2015  . Chronic kidney disease (CKD), stage III (moderate) (HCC)   . Concussion with loss of consciousness   . COPD 04/27/2006   Qualifier: Diagnosis of  By: McDiarmid MD, Sherren Mocha    . COPD exacerbation  ([place])   . COPD, severe   . Decreased functional mobility and endurance 03/17/2015  . Diastolic heart failure ([place]) 07/30/2015  . DISC WITH RADICULOPATHY 04/27/2006   Qualifier: Diagnosis of  By: McDiarmid MD, Sherren Mocha    . Dyspnea   . EDEMA-LEGS,DUE TO VENOUS OBSTRUCT. 04/27/2006   Qualifier: Diagnosis of  By: McDiarmid MD, Sherren Mocha    . 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 Normal uric acid serum level at time of attack.    Marland Kitchen HERNIA, HIATAL, NONCONGENITAL 04/27/2006   Qualifier: Diagnosis of  By: McDiarmid MD, Sherren Mocha    . High risk medications (not anticoagulants) long-term use 03/05/2012  . History of Hemorrhoids 04/27/2006   Qualifier: Diagnosis of  By: McDiarmid MD, Sherren Mocha    . History of iron deficiency 01/16/2015  . Hx of colonoscopy with polypectomy 04/27/2010   Dr Cristina Gong found three  tubular adenomas each less than 10 mm size  . Hypertension   . Hypotension 07/09/2015  . Iron deficiency anemia 08/05/2010   Dr Cristina Gong (GI) has evaluated with EGD, colonoscopy, and video capsular endoscopy in 2011 & 2012.  All have been unrevealing as to an origin of IDA.  OV with Dr Cristina Gong (10/28/10) assessment of blood in stool per hemoccult and GER. Hbg 12.1 g/dL, MCV 91.8, Ferritin 30 ng/mL. Patient taking on ferrous sulfate tab daily.  EGD on 03/06/12 by Dr Cristina Gong for IDA non-obstructing Schatzki's ring at Gastroesophageal junction, otherwise normal esophagus and stomach.     . Leg cramps 05/29/2012  . Lumbar herniated disc    History of HNP L4/5 in 2003  . Macular degeneration, bilateral 10/04/2010   Right eye is wet MD, the other is dry macular degeneration (ARMD). Pt undergoing some form of vascular endothelial growth factor inhibition intraocular therapy.    . Mild cognitive impairment 10/26/2012   (10/25/12) Failed MiniCog screen  . MUSCLE CRAMPS 03/11/2010   Qualifier: Diagnosis of  By: McDiarmid MD, Sherren Mocha    . Muscle spasm of back  09/24/2013  . Myocardial infarct, old   . Nocturia 10/28/2016  . Numbness and tingling in hands 07/21/2011  . Pacemaker    MDT  . PREDIABETES 09/11/2007   Qualifier: Diagnosis of  By: McDiarmid MD, Sherren Mocha    . Retinal hemorrhage of left eye 06/2010  . RHINITIS, ALLERGIC 04/27/2006   Qualifier: Diagnosis of  By: McDiarmid MD, Sherren Mocha    . SCHATZKI'S RING, HX OF 11/26/2007   Qualifier: Diagnosis of  By: McDiarmid MD, Sherren Mocha  An EGD was performed by Dr Cristina Gong on 04/27/2010 for iron deficiency anemia. There was a a transient hiatal hernia with Schatzki's ring. Stomach and duodenum were normal. EGD on 03/06/12 by Dr Cristina Gong for IDA non-obstructing Schatzki's ring at Gastroesophageal junction, otherwise normal esophagus and stomach.    . Seborrheic keratosis, right anterior thigh 12/12/2013  . Shoulder pain, left 01/09/2014  . Sick sinus syndrome with tachycardia ([place])    MDT  . Soft tissue injury of foot 05/03/2011  . Solar lentigo 06/15/2012  . Solitary kidney, acquired 05/17/2010   Surgical removal for transitional cell cancer by Tresa Endo, MD ([facility]). Surveillance cystoscopy by Dr [facility]) on 10/19/12 without evidence of cystoscopic recurrence. Recommend RTC one year for cystoscopy.   Marland Kitchen Spinal stenosis, lumbar   . Transitional cell carcinoma of ureter, history   . Urge incontinence 12/13/2011   Diagnosed in 10/2011 by Dr Bjorn Loser (Urology)   . VENTRICULAR HYPERTROPHY, LEFT 08/28/2008   Qualifier: Diagnosis of  By: McDiarmid MD, Sherren Mocha    . VITAMIN B12 DEFICIENCY 10/07/2009   Qualifier: Diagnosis of  By: McDiarmid MD, Sherren Mocha  Dx based on a post-TKR anemia work-up Low normal serum B12 with high Methylmalonic acid and homocysteine level  Vit B12 serum level (10/28/10) > 1500 pg/mL   . Vitamin D deficiency 11/02/2010   Serum vitamin D 25(OH) = 10.9 ng/mL (30 -100) on 10/28/10 c/w Vitamin D deficiency.       Past Surgical History:  Procedure Laterality Date  . BREAST SURGERY     breast  reduction  . CARDIOVERSION N/A 10/20/2017   Procedure: CARDIOVERSION;  Surgeon: Josue Hector, MD;  Location: [facility] ENDOSCOPY;  Service: Cardiovascular;  Laterality: N/A;  . CHOLECYSTECTOMY    . CORONARY ANGIOPLASTY WITH STENT PLACEMENT  2010   BMS RCA, OM2 occluded  . CYSTOSCOPY  09/30/2015   No cystoscopic evidence of uroepithelial neoplasm.  Follow up surveillance cystoscopy in one year  . CYSTOSCOPY  10/13/2017   Dr Rayn[facility])  . DUPUYTREN CONTRACTURE RELEASE Right 05/22/2014   Procedure: DUPUYTREN RELEASE AND REPAIR AS NECESSARY RIGHT RING FINGER AND MIDDLE FINGER;  Surgeon: Roseanne Kaufman, MD;  Location: [place];  Service: Orthopedics;  Laterality: Right;  . ESOPHAGOGASTRODUODENOSCOPY  04/27/2010   Dr Cristina Gong - found transient H/H & Schatzki's ring  . ESOPHAGOGASTRODUODENOSCOPY ENDOSCOPY  03/06/2012   Dr Buccini - found non-obstuctive Schatzki's ring at [employer] [facility] o/w normal EGD.   Marland Kitchen EYE SURGERY     bilateral cataracts  . KNEE ARTHROSCOPY W/ SYNOVECTOMY  11/2009, left knee   Dr Wynelle Link  . NEPHRECTOMY  For transition cell cancer    Dr Tresa Endo, surgeon  . PACEMAKER GENERATOR CHANGE N/A 11/12/2012   Procedure: PACEMAKER GENERATOR CHANGE;  Surgeon: Evans Lance, MD; [employer] [facility] dual-chamber pacemaker serial number [id number]; Laterality: Right  . PACEMAKER INSERTION  2005   Dr Doreatha Lew  . PACEMAKER LEAD REMOVAL  2005   Removal and reinsertion of atrial and ventricular leads due to migration  . REPLACEMENT TOTAL KNEE  2009, Right knee   Dr Wynelle Link  . REPLACEMENT TOTAL KNEE  05/2009, Left knee   Dr Wynelle Link  . TONSILLECTOMY    . TRIGGER FINGER RELEASE Right 05/22/2014   Procedure: RIGHT HAND A-1 PULLEY RELEASE ;  Surgeon: Roseanne Kaufman, MD;  Location: [place];  Service: Orthopedics;  Laterality: Right;    Current Medications: Prior to Admission medications   Medication Sig Start Date End Date Taking? Authorizing Provider  albuterol (PROVENTIL) (2.5 MG/3ML)  0.083% nebulizer solution Take 3 mLs (2.5 mg total) by nebulization every 4 (four) hours as needed for up to 7 days for wheezing or shortness of breath. 09/15/17 10/17/17  Bonnita Hollow, MD  albuterol (VENTOLIN HFA) 108 (90 Base) MCG/ACT inhaler Inhale 2 puffs into the lungs every 6 (six) hours as needed for wheezing or shortness of breath.    [provider]  amiodarone (PACERONE) 200 MG tablet Take one tablet by mouth daily Monday through Friday.  Do not take on Saturday or Sunday. 10/23/17   Evans Lance, MD  apixaban (ELIQUIS) 2.5 MG TABS tablet Take 1 tablet (2.5 mg total) by mouth 2 (two) times daily. 10/21/17   Lyda Jester M, PA-C  atorvastatin (LIPITOR) 20 MG tablet TAKE 1 TABLET BY MOUTH DAILY 12/19/16   McDiarmid, Blane Ohara, MD  budesonide (PULMICORT) 0.5 MG/2ML nebulizer solution Take 2 mLs (0.5 mg total) by nebulization 2 (two) times daily. 10/09/17   McDiarmid, Blane Ohara, MD  fluticasone (FLONASE) 50 MCG/ACT nasal spray SHAKE LIQUID AND USE 2 SPRAYS IN EACH NOSTRIL DAILY 01/31/17   Collene Gobble, MD  Fluticasone-Umeclidin-Vilant (TRELEGY ELLIPTA) 100-62.5-25 MCG/INH AEPB Inhale 1 puff into the lungs daily.    [provider]  furosemide (LASIX) 20 MG tablet Take 1 tablet (20 mg total) by mouth daily. 10/21/17   Lyda Jester M, PA-C  ipratropium-albuterol (DUONEB) 0.5-2.5 (3) MG/3ML SOLN USE 3 ML VIA NEBULIZER EVERY 4 HOURS 08/25/17   Byrum, Rose Fillers, MD  metoprolol tartrate (LOPRESSOR) 50 MG tablet Take 1 tablet by mouth daily. 10/16/17   [provider]  nitroGLYCERIN (NITROSTAT) 0.4 MG SL tablet Place 1 tablet (0.4 mg total) under the tongue every 5 (five) minutes as needed for chest pain (x 3 tabs). 11/16/15   McDiarmid, Blane Ohara, MD  omeprazole (PRILOSEC) 20 MG capsule Take 1 capsule (20 mg total) by mouth daily. 09/14/17   Zenia Resides, MD  traMADol (ULTRAM) 50 MG tablet TAKE 1 TABLET BY MOUTH EVERY 6 HOURS AS NEEDED FOR PAIN 03/17/17   McDiarmid, Blane Ohara, MD  vitamin B-12 (CYANOCOBALAMIN) 1000 MCG tablet Take 1 tablet (1,000 mcg total) by mouth daily. 09/14/17   Zenia Resides, MD    Allergies:   Baclofen; Codeine phosphate; Hydrochlorothiazide; Montelukast sodium; and Norco [hydrocodone-acetaminophen]  Social History   Socioeconomic History  . Marital status: Widowed    Spouse name: Not on file  . Number of children: 4  . Years of education: Not on file  . Highest education level: Not on file  Occupational History  . 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 Financial resource strain: Not on file  . Food insecurity:    Worry: Not on file    Inability: Not on file  . Transportation needs:    Medical: Not on file    Non-medical: Not on file  Tobacco Use  . Smoking status: Former Smoker    Packs/day: 1.00    Types: Cigarettes    Last attempt to quit: 03/02/2009    Years since quitting: 8.6  . Smokeless tobacco: Never Used  Substance and Sexual Activity  . Alcohol use: Yes    Alcohol/week: 13.0 standard drinks    Types: 7 Glasses of wine, 6 Standard drinks or equivalent per week    Comment: 5 oz white wine per day  . Drug use: No  . Sexual activity: Never  Lifestyle  . Physical activity:    Days per week: Not on file    Minutes per session: Not on file  . Stress: Not on file  Relationships  . Social connections:    Talks on phone: Not on file    Gets together: Not on file    Attends religious service: Not on file    Active member of club or organization: Not on file    Attends meetings of clubs or organizations: Not on file    Relationship status: Not on file  Other Topics Concern  . 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    Semi-retired Futures trader.     Pt has home nebulizer  for inhalation therapies.   Former Smoker   Smoking Status:  quit > 5 years ago      (+) DNR status per discussion with Dr McDiarmid 10/25/12 and reiterated 06/20/13 office visit .   (+) Living Will/Advance Directive   (+) HC-POA: Cecile Sheerer Causey (pt's dgt)      Current Social History 09/22/2016        Patient lives with daughter, [name] (flight attendant), in two level home 09/22/2016   Transportation: Patient has own vehicle 09/22/2016   Important Relationships 4 daughters and 61 yo grandson 09/22/2016    Pets: None 09/22/2016   Education / Work:  College/ Retired Futures trader 09/22/2016   Interests / Fun: Reads 2 books per week, watches TV, entertains 09/22/2016   Current Stressors: 84 yo house in need of outside repairs 09/22/2016   Religious / Personal Beliefs: Joanie Coddington 09/22/2016   Other: Had a wonderful marriage/ Very thankful/ wants to continue traveling/ closest friends are all gone 09/22/2016   L. Silvano Rusk, RN, BSN  Family History:  The patient's family history includes Dementia in her mother; Heart attack in her father; Heart disease in her father. ***  ROS:   Please see the history of present illness.    ROS All other systems reviewed and are negative.   PHYSICAL EXAM:   VS:  There were no vitals taken for this visit.   GEN: Well nourished, well developed, in no acute distress HEENT: normal Neck: no JVD, carotid bruits, or masses Cardiac: ***RRR; no murmurs, rubs, or gallops,no edema  Respiratory:  clear to auscultation bilaterally, normal work of breathing GI: soft, nontender, nondistended, + BS MS: no deformity or atrophy Skin: warm and dry, no rash Neuro:  Alert and Oriented x 3, Strength and sensation are intact Psych: euthymic mood, full affect  Wt Readings from Last 3 Encounters:  10/24/17 175 lb 6.4 oz (79.6 kg)  10/23/17 175 lb 3.2 oz (79.5 kg)    10/21/17 172 lb 9.9 oz (78.3 kg)      Studies/Labs Reviewed:   EKG:  EKG is ordered today.  The ekg ordered today demonstrates ***  Recent Labs: 10/16/2017: ALT 11; B Natriuretic Peptide 813.7; Hemoglobin 10.6; Magnesium 1.9; Platelets 198; TSH 1.374 10/21/2017: BUN 36; Creatinine, Ser 1.58; Potassium 4.2; Sodium 141   Lipid Panel    Component Value Date/Time   CHOL 130 08/11/2016 1101   TRIG 106 08/11/2016 1101   HDL 37 (L) 08/11/2016 1101   CHOLHDL 3.5 08/11/2016 1101   CHOLHDL 3.4 07/30/2015 1231   VLDL 29 07/30/2015 1231   LDLCALC 72 08/11/2016 1101   LDLDIRECT 74 05/08/2014 1110    Additional studies/ records that were reviewed today include:   Echocardiogram:  Cardiac Catheterization:     ASSESSMENT & PLAN:    1. ***    Medication Adjustments/Labs and Tests Ordered: Current medicines are reviewed at length with the patient today.  Concerns regarding medicines are outlined above.  Medication changes, Labs and Tests ordered today are listed in the Patient Instructions below. There are no Patient Instructions on file for this visit.   Jarrett Soho, [place]  11/01/2017 9:01 AM    [address], [place], [place]  [address] Phone: [phone]; Fax: [phone]

## 2017-11-01 NOTE — Telephone Encounter (Signed)
Per Robbie Lis, PA-C, I called pt to let her know that since she had already f/u with Dr. Lovena Le post hosp, she didn't need another o/v today. The only thing that she would need is a Art gallery manager.  Will put in a order for that. Pt daughter, [name], [place] on file, verbalized understanding.

## 2017-11-02 ENCOUNTER — Encounter: Payer: Self-pay | Admitting: Family Medicine

## 2017-11-02 ENCOUNTER — Ambulatory Visit (INDEPENDENT_AMBULATORY_CARE_PROVIDER_SITE_OTHER): Payer: Medicare Other | Admitting: Family Medicine

## 2017-11-02 ENCOUNTER — Other Ambulatory Visit: Payer: Self-pay

## 2017-11-02 ENCOUNTER — Ambulatory Visit
Admission: RE | Admit: 2017-11-02 | Discharge: 2017-11-02 | Disposition: A | Discharge status: Home / Self Care | Payer: Medicare Other | Source: Ambulatory Visit | Attending: Family Medicine | Admitting: Family Medicine

## 2017-11-02 VITALS — BP 114/68 | HR 101 | Temp 98.4°F | Ht 65.0 in | Wt 172.0 lb

## 2017-11-02 DIAGNOSIS — S92354A Nondisplaced fracture of fifth metatarsal bone, right foot, initial encounter for closed fracture: Secondary | ICD-10-CM | POA: Diagnosis not present

## 2017-11-02 DIAGNOSIS — I872 Venous insufficiency (chronic) (peripheral): Secondary | ICD-10-CM

## 2017-11-02 DIAGNOSIS — Z23 Encounter for immunization: Secondary | ICD-10-CM

## 2017-11-02 DIAGNOSIS — I5032 Chronic diastolic (congestive) heart failure: Secondary | ICD-10-CM | POA: Diagnosis not present

## 2017-11-02 DIAGNOSIS — N184 Chronic kidney disease, stage 4 (severe): Secondary | ICD-10-CM

## 2017-11-02 DIAGNOSIS — S41119A Laceration without foreign body of unspecified upper arm, initial encounter: Secondary | ICD-10-CM

## 2017-11-02 DIAGNOSIS — I480 Paroxysmal atrial fibrillation: Secondary | ICD-10-CM | POA: Diagnosis not present

## 2017-11-02 DIAGNOSIS — M25571 Pain in right ankle and joints of right foot: Secondary | ICD-10-CM

## 2017-11-02 DIAGNOSIS — M109 Gout, unspecified: Secondary | ICD-10-CM

## 2017-11-02 DIAGNOSIS — I130 Hypertensive heart and chronic kidney disease with heart failure and stage 1 through stage 4 chronic kidney disease, or unspecified chronic kidney disease: Secondary | ICD-10-CM | POA: Diagnosis not present

## 2017-11-02 DIAGNOSIS — J441 Chronic obstructive pulmonary disease with (acute) exacerbation: Secondary | ICD-10-CM | POA: Diagnosis not present

## 2017-11-02 DIAGNOSIS — I482 Chronic atrial fibrillation: Secondary | ICD-10-CM | POA: Diagnosis not present

## 2017-11-02 DIAGNOSIS — I2510 Atherosclerotic heart disease of native coronary artery without angina pectoris: Secondary | ICD-10-CM | POA: Diagnosis not present

## 2017-11-02 MED ORDER — PREDNISONE 20 MG PO TABS: 40.0000 mg | ORAL_TABLET | Freq: Every day | ORAL | 0 refills | Status: DC

## 2017-11-02 NOTE — Patient Instructions (Signed)
In case this pain and swelling in your right ankle is a gout flare, Dr Inmer Nix wants you to take Prednisone for 7 days.   Please go for an Xray of your right ankle to make sure there is no fracture of a bone.   Wear the Unna boot wraps of both your legs until you see Dr Lylian Sanagustin again next WEdnesday.   Keep the thick yellow bandage on your right arm wound until Dr Allen Egerton sees you next week.   Stop the Lasix for now.  We will recheck your kidney blood work next week.   Bring your compression stockings with you next week.  Dr Keyaan Lederman may try to put them on you after the Unna boots come off.

## 2017-11-02 NOTE — Progress Notes (Signed)
Subjective:   Patient ID: [name]    DOB: 02-27-1924, 84 y.o. female   MRN: [id number]  Chief Complaint: follow up of bilateral leg swelling  History of Present Illness: [name] is a 84 y.o. woman with history of paroxysmal atrial fibrillation, CHF and COPD who presents to clinic today for follow up of her bilateral leg swelling.   Patient was admitted for to the hospital 10/16/2017 to 10/21/2017 for decompensated CHF with preserved ejection fraction and symptomatic paroxysmal atrial fibrillation. Hospital course involved diuresis with furosemide 40mg  bid for hypervolemia and cardioversion for an irregular rhythm.   Leg Swelling Patient denies improvement in her leg swelling. She reports taking furosemide 20mg  qd.       Review of Systems    [place]: nO SMOKING CURRENTLY     Current Outpatient Medications  Medication Sig Dispense Refill  . albuterol (PROVENTIL) (2.5 MG/3ML) 0.083% nebulizer solution Take 3 mLs (2.5 mg total) by nebulization every 4 (four) hours as needed for up to 7 days for wheezing or shortness of breath. 150 mL 1  . albuterol (VENTOLIN HFA) 108 (90 Base) MCG/ACT inhaler Inhale 2 puffs into the lungs every 6 (six) hours as needed for wheezing or shortness of breath.    Marland Kitchen amiodarone (PACERONE) 200 MG tablet Take one tablet by mouth daily Monday through Friday.  Do not take on Saturday or Sunday. 90 tablet 3  . apixaban (ELIQUIS) 2.5 MG TABS tablet Take 1 tablet (2.5 mg total) by mouth 2 (two) times daily. 60 tablet 10  . atorvastatin (LIPITOR) 20 MG tablet TAKE 1 TABLET BY MOUTH DAILY 90 tablet 3  . budesonide (PULMICORT) 0.5 MG/2ML nebulizer solution Take 2 mLs (0.5 mg total) by nebulization 2 (two) times daily. 120 mL 12  . fluticasone (FLONASE) 50 MCG/ACT nasal spray SHAKE LIQUID AND USE 2 SPRAYS IN EACH NOSTRIL DAILY 48 g 3  . Fluticasone-Umeclidin-Vilant (TRELEGY ELLIPTA) 100-62.5-25 MCG/INH AEPB Inhale 1 puff into the lungs daily.    . furosemide  (LASIX) 20 MG tablet Take 1 tablet (20 mg total) by mouth daily. 90 tablet 3  . ipratropium-albuterol (DUONEB) 0.5-2.5 (3) MG/3ML SOLN USE 3 ML VIA NEBULIZER EVERY 4 HOURS 540 mL 0  . metoprolol tartrate (LOPRESSOR) 50 MG tablet Take 1 tablet by mouth daily.    . nitroGLYCERIN (NITROSTAT) 0.4 MG SL tablet Place 1 tablet (0.4 mg total) under the tongue every 5 (five) minutes as needed for chest pain (x 3 tabs). 25 tablet PRN  . omeprazole (PRILOSEC) 20 MG capsule Take 1 capsule (20 mg total) by mouth daily. 90 capsule 3  . predniSONE (DELTASONE) 20 MG tablet Take 2 tablets (40 mg total) by mouth daily. Take for 7 days 14 tablet 0  . traMADol (ULTRAM) 50 MG tablet TAKE 1 TABLET BY MOUTH EVERY 6 HOURS AS NEEDED FOR PAIN 30 tablet 5  . vitamin B-12 (CYANOCOBALAMIN) 1000 MCG tablet Take 1 tablet (1,000 mcg total) by mouth daily. 90 tablet 3   No current facility-administered medications for this visit.     Objective:   BP 114/68   Pulse (!) 101   Temp 98.4 F (36.9 C) (Oral)   Ht 5\' 5"  (1.651 m)   Wt 172 lb (78 kg)   SpO2 97%   BMI 28.62 kg/m   Physical Exam  Constitutional: She appears well-developed and well-nourished.  Pulmonary/Chest: Effort normal.  Musculoskeletal: She exhibits edema and tenderness.  Right ankle: edema, warmth, erythema and tenderness  Skin: Skin is warm.  Right forearm: 4cm x 2cm  Nursing note and vitals reviewed.    Assessment & Plan:  [name] is a 84 y.o. female who presents to clinic today with complaint of Leg swelling, weight decreased   No problem-specific Assessment & Plan notes found for this encounter.  Orders Placed This Encounter  Procedures  . DG Ankle Complete Right    Standing Status:   Future    Standing Expiration Date:   01/03/2019    Order Specific Question:   Reason for Exam (SYMPTOM  OR DIAGNOSIS REQUIRED)    Answer:   Right ankle swelling and pain. R/O fracture and chondrocalcinosis    Order Specific Question:   Preferred  imaging location?    Answer:   [id number]    Order Specific Question:   Radiology Contrast Protocol - do NOT remove file path    Answer:   [email or web address].[email or web address]  . Flu Vaccine QUAD 36+ mos IM   Meds ordered this encounter  Medications  . predniSONE (DELTASONE) 20 MG tablet    Sig: Take 2 tablets (40 mg total) by mouth daily. Take for 7 days    Dispense:  14 tablet    Refill:  [place], [facility] 11/02/2017 10:29 AM

## 2017-11-03 DIAGNOSIS — I130 Hypertensive heart and chronic kidney disease with heart failure and stage 1 through stage 4 chronic kidney disease, or unspecified chronic kidney disease: Secondary | ICD-10-CM | POA: Diagnosis not present

## 2017-11-03 DIAGNOSIS — N184 Chronic kidney disease, stage 4 (severe): Secondary | ICD-10-CM | POA: Diagnosis not present

## 2017-11-03 DIAGNOSIS — S41119A Laceration without foreign body of unspecified upper arm, initial encounter: Secondary | ICD-10-CM | POA: Insufficient documentation

## 2017-11-03 DIAGNOSIS — I482 Chronic atrial fibrillation: Secondary | ICD-10-CM | POA: Diagnosis not present

## 2017-11-03 DIAGNOSIS — J441 Chronic obstructive pulmonary disease with (acute) exacerbation: Secondary | ICD-10-CM | POA: Diagnosis not present

## 2017-11-03 DIAGNOSIS — I5032 Chronic diastolic (congestive) heart failure: Secondary | ICD-10-CM | POA: Diagnosis not present

## 2017-11-03 DIAGNOSIS — I2510 Atherosclerotic heart disease of native coronary artery without angina pectoris: Secondary | ICD-10-CM | POA: Diagnosis not present

## 2017-11-03 HISTORY — DX: Laceration without foreign body of unspecified upper arm, initial encounter: S41.119A

## 2017-11-03 NOTE — Progress Notes (Signed)
Patient ID: [name], female   DOB: [birth year], 84 y.o.   MRN: [id number] I have interviewed and examined the patient.  I have discussed the case and verified the key findings with [facility].   I agree with their assessments and plans as documented in their note for today.   A/ 1. Bilateral leg edema     - wt down to 172 lbs from 175 lbs last week. She took Lasix 20 mg for two to three days after DC from hospital, but has not taken any Lasix since bc of fear of worsening her CKD.      - TTE (10/19/17) showed EF 50-55%. Unable assess diastolic function. Estimated Pulmonary Artery Pressure 18 mmHg.  Respirophasic diameter change >50% c/w normal central venous pressure.      - My working explanation for bilateral edema is chronic venous insufficiency predominating.   Patient obtained graduated compression stockings but says she cannot don them bc of the stocking material's tightness and the swelling in her legs     - Right leg without edema after removal of Unna's Boot in office today.     - Plan: bilateral Unna's Boots placed.  RTC one week with compression stockings.  Will place stockings right after boots removed.  Patient would benefit from a stocking caddy to help donning stockings.   Will recommend Ms Reichenbach see [facility] for possible interventions to asses for venous insufficiency with possible interventions.     2. Left lateral malleolar region venous stasis ulcer      - ~2 cm diameter, circular superficial skin ulcer with granulation tissue at base and healthy surrounding tissue.       - Wound appears to be healing.      - Plan: Will continue compression therapy with Unna's Boot replacement today.  Recheck wound next week in office.   3. Right ankle pain, acute, ankle inversion trauma        - DG XR R Ankle: possible 5th metatarsal styloid avulsion fracture        - Recommend firm soled shoe for now along with Unna wrap to reduce lateral ankle swelling. .  Will reassess next week.  If with  significant pain, will recommend short aircast walker boot for two to 4 weeks.  Will use short burst of prednisone for bony pain given CKD 4.    4. Atrial Fibrillation, paroxsymal        - Irregularly, irregular rhythm on exam today        - Rate 100 bpm, on DOAC. Has pacemaker, On Amiodarone        - Recent DCCV in hospital admission        - Plan: Continue apixaban and metoprolol and amiodarone  5. Right dorsal forearm just distal to elbow superficial flap laceration        - Trip with rising from sofa resulting in injurious fall.         - No evidence of infection. Scabrous slough adherent to wound edges        - HH RN providing protective dressing since injury        - Healing wound but need superficial debridement.        - Plan: Autolysis with colloidal dressing for one week. Recheck next week.  6. Chronic Kidney Disease, Stage 4        - SCr up slightly to 1.9 (11/01/17) from 1.6 (10/21/17)        - Plan:  Holding lasix for now.  Recheck SCr next week along with monitoring for ADHF evidence.

## 2017-11-07 DIAGNOSIS — J441 Chronic obstructive pulmonary disease with (acute) exacerbation: Secondary | ICD-10-CM | POA: Diagnosis not present

## 2017-11-07 DIAGNOSIS — N184 Chronic kidney disease, stage 4 (severe): Secondary | ICD-10-CM | POA: Diagnosis not present

## 2017-11-07 DIAGNOSIS — I2510 Atherosclerotic heart disease of native coronary artery without angina pectoris: Secondary | ICD-10-CM | POA: Diagnosis not present

## 2017-11-07 DIAGNOSIS — I5032 Chronic diastolic (congestive) heart failure: Secondary | ICD-10-CM | POA: Diagnosis not present

## 2017-11-07 DIAGNOSIS — I482 Chronic atrial fibrillation: Secondary | ICD-10-CM | POA: Diagnosis not present

## 2017-11-07 DIAGNOSIS — I130 Hypertensive heart and chronic kidney disease with heart failure and stage 1 through stage 4 chronic kidney disease, or unspecified chronic kidney disease: Secondary | ICD-10-CM | POA: Diagnosis not present

## 2017-11-08 ENCOUNTER — Ambulatory Visit (INDEPENDENT_AMBULATORY_CARE_PROVIDER_SITE_OTHER): Payer: Medicare Other | Admitting: Family Medicine

## 2017-11-08 DIAGNOSIS — N184 Chronic kidney disease, stage 4 (severe): Secondary | ICD-10-CM | POA: Diagnosis not present

## 2017-11-08 DIAGNOSIS — H353211 Exudative age-related macular degeneration, right eye, with active choroidal neovascularization: Secondary | ICD-10-CM | POA: Diagnosis not present

## 2017-11-08 DIAGNOSIS — H353221 Exudative age-related macular degeneration, left eye, with active choroidal neovascularization: Secondary | ICD-10-CM | POA: Diagnosis not present

## 2017-11-08 DIAGNOSIS — I83023 Varicose veins of left lower extremity with ulcer of ankle: Secondary | ICD-10-CM

## 2017-11-08 DIAGNOSIS — S41119A Laceration without foreign body of unspecified upper arm, initial encounter: Secondary | ICD-10-CM | POA: Diagnosis not present

## 2017-11-08 DIAGNOSIS — S92355D Nondisplaced fracture of fifth metatarsal bone, left foot, subsequent encounter for fracture with routine healing: Secondary | ICD-10-CM | POA: Diagnosis not present

## 2017-11-08 DIAGNOSIS — I480 Paroxysmal atrial fibrillation: Secondary | ICD-10-CM

## 2017-11-08 DIAGNOSIS — L97329 Non-pressure chronic ulcer of left ankle with unspecified severity: Secondary | ICD-10-CM

## 2017-11-08 DIAGNOSIS — I5032 Chronic diastolic (congestive) heart failure: Secondary | ICD-10-CM | POA: Diagnosis not present

## 2017-11-08 NOTE — Patient Instructions (Signed)
Keep the [employer] wrap on your left leg.  This is to help the sore around your left ankle to keep healing.   Dr Regan Llorente will send a request to [facility] to change the [facility] next week.  Dr Yamili Lichtenwalner will take the boot off the week after [facility] put it on to see how the wound is healing.    If the boot becomes painful, please cut it off.    Keep the sore on your right elbow moist using vaseline or antibiotic ointment.  This will help the baby cells crawl across the wound as they close the wound.  Keep using the vaseline until the wound is closed.

## 2017-11-09 ENCOUNTER — Telehealth: Payer: Self-pay

## 2017-11-09 ENCOUNTER — Encounter: Payer: Self-pay | Admitting: Family Medicine

## 2017-11-09 DIAGNOSIS — N184 Chronic kidney disease, stage 4 (severe): Secondary | ICD-10-CM | POA: Diagnosis not present

## 2017-11-09 DIAGNOSIS — I130 Hypertensive heart and chronic kidney disease with heart failure and stage 1 through stage 4 chronic kidney disease, or unspecified chronic kidney disease: Secondary | ICD-10-CM | POA: Diagnosis not present

## 2017-11-09 DIAGNOSIS — I5032 Chronic diastolic (congestive) heart failure: Secondary | ICD-10-CM | POA: Diagnosis not present

## 2017-11-09 DIAGNOSIS — J441 Chronic obstructive pulmonary disease with (acute) exacerbation: Secondary | ICD-10-CM | POA: Diagnosis not present

## 2017-11-09 DIAGNOSIS — I482 Chronic atrial fibrillation: Secondary | ICD-10-CM | POA: Diagnosis not present

## 2017-11-09 DIAGNOSIS — S92353A Displaced fracture of fifth metatarsal bone, unspecified foot, initial encounter for closed fracture: Secondary | ICD-10-CM

## 2017-11-09 DIAGNOSIS — I2510 Atherosclerotic heart disease of native coronary artery without angina pectoris: Secondary | ICD-10-CM | POA: Diagnosis not present

## 2017-11-09 HISTORY — DX: Displaced fracture of fifth metatarsal bone, unspecified foot, initial encounter for closed fracture: S92.353A

## 2017-11-09 LAB — RENAL FUNCTION PANEL
Albumin: 3.9 g/dL (ref 3.2–4.6)
BUN / CREAT RATIO: 20 (ref 12–28)
BUN: 33 mg/dL (ref 10–36)
CHLORIDE: 101 mmol/L (ref 96–106)
CO2: 25 mmol/L (ref 20–29)
Calcium: 8.7 mg/dL (ref 8.7–10.3)
Creatinine, Ser: 1.66 mg/dL — ABNORMAL HIGH (ref 0.57–1.00)
GFR, EST AFRICAN AMERICAN: 31 mL/min/{1.73_m2} — AB (ref >59)
GFR, EST NON AFRICAN AMERICAN: 27 mL/min/{1.73_m2} — AB (ref >59)
GLUCOSE: 127 mg/dL — AB (ref 65–99)
Phosphorus: 3.5 mg/dL (ref 2.5–4.5)
Potassium: 4.5 mmol/L (ref 3.5–5.2)
Sodium: 138 mmol/L (ref 134–144)

## 2017-11-09 NOTE — Telephone Encounter (Signed)
Dominica Severin- PT with [facility] calling to inform MD that pt had a fall off sofa this morning. Slight abrasion to R knee. Did not need ED eval. Call back with any questions [phone] Wallace Cullens, RN

## 2017-11-09 NOTE — Assessment & Plan Note (Signed)
Established problem that has improved.  Adpprox two weeks after injury. Resolution of pain, warmth and localized ankle edema. No interference with gait, iADL, ADLs Encourgaed use of rigid sole shoe, but anticipate she will continue to wear stylish saddles and flats with flexible soles Will request hhpt to start ankle rehab exercises

## 2017-11-09 NOTE — Assessment & Plan Note (Signed)
Established problem Pt not taking any Lasix for last two weeks approx Renal panel drwn

## 2017-11-09 NOTE — Assessment & Plan Note (Signed)
Established problem. Stable. Stable weight.  No symptoms/signs of pulm edema Continue current therapy Off Lasix while awaiting renal panel.  Will send copy panel to cardiology

## 2017-11-09 NOTE — Assessment & Plan Note (Signed)
Established problem. Stable. Pt lost colloidal dressing.  Her dementia interferes with her self care of wound. Trial of petroleum vaseline daily for dry wound base with adherent slough No astringents Instructions given to pt's dgt, [name], to help with pt f/u of instructions.

## 2017-11-09 NOTE — Assessment & Plan Note (Signed)
Established problem Controlled rate.  Irr Irr rhthym by physical exam today Continue current therapy regiment.

## 2017-11-09 NOTE — Progress Notes (Signed)
Subjective:    Patient ID: [name], female    DOB: 1923/03/23, 84 y.o.   MRN: [id number] [name] is accompanied by daughter, [name] Sources of clinical information for visit is/are patient, relative(s) and past medical records. Nursing assessment for this office visit was reviewed with the patient for accuracy and revision.  Previous Report(s) Reviewed: historical medical records and imaging reports: positive  joint(s): right ankle(s)  Findings: 5th metatarsal proximal avulasion  fx  Depression screen PHQ 2/9 11/02/2017  Decreased Interest 0  Down, Depressed, Hopeless 0  PHQ - 2 Score 0  Altered sleeping -  Tired, decreased energy -  Change in appetite -  Feeling bad or failure about yourself  -  Trouble concentrating -  Moving slowly or fidgety/restless -  Suicidal thoughts -  PHQ-9 Score -  Difficult doing work/chores -  Some recent data might be hidden   Fall Risk  11/02/2017 10/06/2017 09/21/2017 09/15/2017 08/28/2017  Falls in the past year? Yes No No No No  Comment - - - - -  Number falls in past yr: 2 or more - - - -  Injury with Fall? Yes - - - -  Comment - - - - -  Risk Factor Category  High Fall Risk - - - -  Risk for fall due to : History of fall(s) - - - -  Follow up - - - - -    HPI  Right 5th metatarsal avulasion fx - Injury about 10 days ago, inversion injury per hhrn but pt dn recall - XR r ankle with proximal 5th mt avulsion fx.  - pt was wearing Unna boot right until two days ago when removed by hhrn. Pt not wearing firm soled shoe as instructed. Reports not having any. - Denies pain in right foot or ankle. Able to wlk and bear weight without pain.  - Able perform ADLs and iADLs without difficulty  Left venous stasis ulcer - Dx last week during ov - Tx'd with Unna boot with coban wrap - Pt reports incr pain at site of dlateral malleous area.  Pain relieved when Unna boot removed by hhrn two days abo. - minial drainage from wound that is serous - no  further pain in wound site. Rtn of ankle edema with removal of wrap  Right elbow region lac, superficial - Misplaced duoderm. Initial duoderm came off. - no pain at site.  No drainage or discharge from wound. No interference with ROM elbow or use of right arm  CKD IV - ot taking lasix.  Denies sob above BL. No lightheadedness, dizziness, chestpain  SH: smoking status reviewed  Review of Systems No fever, no loss appetite, no weight loss    Objective:   Physical Exam VS reviewed GEN: Alert, Cooperative, Groomed, NAD COR: irr irr,  bpm 88, no murmur, no gallup LUNGS: BCTA, No Acc mm use, speaking in full sentences EXT: bilateral 1+ ankle edema. Palp DPP bilaterally Trace edema lateral ankle, no incr warmth, no erythema.  No tenderness to palp of base 5th MT.  Nonatalgic gait.  Equal stance phase bil SKIN:1.5 diam circular lesion just proximal to left let malleolus 1 mm depth., granular tissue base. Normal surrounding tissue. No tenderness, minimal increased warmth aound lbase. No drainage no expr dc  Right dorsal elbow with rhomboid wound with superficial scaborous slough adherent.  No surrounding warth or edema, minimall tender to palp    Gait: Normal speed - using one point cane,  No significant path deviation, Step through +,  Psych: Normal affect/thought/speech/language   Assessment & Plan:  Visit Problem List with A/P  Visit Problem List with A/P  Paroxysmal atrial fibrillation ([place]) Established problem Controlled rate.  Irr Irr rhthym by physical exam today Continue current therapy regiment.   Chronic diastolic CHF (congestive heart failure) ([place]) Established problem. Stable. Stable weight.  No symptoms/signs of pulm edema Continue current therapy Off Lasix while awaiting renal panel.  Will send copy panel to cardiology   Chronic kidney disease (CKD), stage IV (severe) ([place]) Established problem Pt not taking any Lasix for last two weeks approx Renal panel  drwn  Venous stasis ulcer of ankle, left ([place]) Established problem that has improved.  Conitnue Unna boot compression therapy. Request [facility] hhrn to change boot next week, then follow up in [place] clinic in two weeks for wound recheck  Laceration of arm, unspecified laterality, initial encounter Established problem. Stable. Pt lost colloidal dressing.  Her dementia interferes with her self care of wound. Trial of petroleum vaseline daily for dry wound base with adherent slough No astringents Instructions given to pt's dgt, [name], to help with pt f/u of instructions.    Fracture of 5th metatarsal, Right, avulsion fx of tip styloid Established problem that has improved.  Adpprox two weeks after injury. Resolution of pain, warmth and localized ankle edema. No interference with gait, iADL, ADLs Encourgaed use of rigid sole shoe, but anticipate she will continue to wear stylish saddles and flats with flexible soles Will request hhpt to start ankle rehab exercises       30 minutes face to face where spent in total with counseling / coordination of care took more than 50% of the total time Counseling  Wound care, nature of venous status ulcer, duration of likely therapy, setting up hhrn wound care instructions.    F/U appointment in 2 week with Dr Wendy Poet

## 2017-11-09 NOTE — Assessment & Plan Note (Signed)
Established problem that has improved.  Conitnue Unna boot compression therapy. Request [facility] hhrn to change boot next week, then follow up in [place] clinic in two weeks for wound recheck

## 2017-11-10 ENCOUNTER — Telehealth: Payer: Self-pay

## 2017-11-10 DIAGNOSIS — I130 Hypertensive heart and chronic kidney disease with heart failure and stage 1 through stage 4 chronic kidney disease, or unspecified chronic kidney disease: Secondary | ICD-10-CM | POA: Diagnosis not present

## 2017-11-10 DIAGNOSIS — I482 Chronic atrial fibrillation: Secondary | ICD-10-CM | POA: Diagnosis not present

## 2017-11-10 DIAGNOSIS — I2510 Atherosclerotic heart disease of native coronary artery without angina pectoris: Secondary | ICD-10-CM | POA: Diagnosis not present

## 2017-11-10 DIAGNOSIS — J441 Chronic obstructive pulmonary disease with (acute) exacerbation: Secondary | ICD-10-CM | POA: Diagnosis not present

## 2017-11-10 DIAGNOSIS — I5032 Chronic diastolic (congestive) heart failure: Secondary | ICD-10-CM | POA: Diagnosis not present

## 2017-11-10 DIAGNOSIS — N184 Chronic kidney disease, stage 4 (severe): Secondary | ICD-10-CM | POA: Diagnosis not present

## 2017-11-10 NOTE — Telephone Encounter (Signed)
Tammy Mackie, RN with [facility], called to state that patient has a skin tear on right knee from fall yesterday. She cleansed it and put a gauze bandage on it.  Patient tells her that [employer] on left leg is to remain untouched until her next visit with PCP.  [name] needs that confirmed.  Call back is [phone]  Danley Danker, RN [facility])

## 2017-11-12 DIAGNOSIS — I482 Chronic atrial fibrillation: Secondary | ICD-10-CM | POA: Diagnosis not present

## 2017-11-12 DIAGNOSIS — J441 Chronic obstructive pulmonary disease with (acute) exacerbation: Secondary | ICD-10-CM | POA: Diagnosis not present

## 2017-11-12 DIAGNOSIS — I130 Hypertensive heart and chronic kidney disease with heart failure and stage 1 through stage 4 chronic kidney disease, or unspecified chronic kidney disease: Secondary | ICD-10-CM | POA: Diagnosis not present

## 2017-11-12 DIAGNOSIS — S90522D Blister (nonthermal), left ankle, subsequent encounter: Secondary | ICD-10-CM | POA: Diagnosis not present

## 2017-11-12 DIAGNOSIS — I5032 Chronic diastolic (congestive) heart failure: Secondary | ICD-10-CM | POA: Diagnosis not present

## 2017-11-12 DIAGNOSIS — I2510 Atherosclerotic heart disease of native coronary artery without angina pectoris: Secondary | ICD-10-CM | POA: Diagnosis not present

## 2017-11-13 DIAGNOSIS — I5032 Chronic diastolic (congestive) heart failure: Secondary | ICD-10-CM | POA: Diagnosis not present

## 2017-11-13 DIAGNOSIS — S90522D Blister (nonthermal), left ankle, subsequent encounter: Secondary | ICD-10-CM | POA: Diagnosis not present

## 2017-11-13 DIAGNOSIS — I2510 Atherosclerotic heart disease of native coronary artery without angina pectoris: Secondary | ICD-10-CM | POA: Diagnosis not present

## 2017-11-13 DIAGNOSIS — J441 Chronic obstructive pulmonary disease with (acute) exacerbation: Secondary | ICD-10-CM | POA: Diagnosis not present

## 2017-11-13 DIAGNOSIS — I482 Chronic atrial fibrillation: Secondary | ICD-10-CM | POA: Diagnosis not present

## 2017-11-13 DIAGNOSIS — I130 Hypertensive heart and chronic kidney disease with heart failure and stage 1 through stage 4 chronic kidney disease, or unspecified chronic kidney disease: Secondary | ICD-10-CM | POA: Diagnosis not present

## 2017-11-13 NOTE — Telephone Encounter (Signed)
Verbal orders given to [facility]. [place] Surges,CMA

## 2017-11-13 NOTE — Telephone Encounter (Signed)
Please request Olivia Mackie [facility] RN, replace left Unna boot this Thursday 9/19 +/- a day. Dr Buell Parcel will see patient 11/23/17 to remove Louretta Parma boot placed by [facility] RN.

## 2017-11-14 DIAGNOSIS — I2510 Atherosclerotic heart disease of native coronary artery without angina pectoris: Secondary | ICD-10-CM | POA: Diagnosis not present

## 2017-11-14 DIAGNOSIS — I5032 Chronic diastolic (congestive) heart failure: Secondary | ICD-10-CM | POA: Diagnosis not present

## 2017-11-14 DIAGNOSIS — S90522D Blister (nonthermal), left ankle, subsequent encounter: Secondary | ICD-10-CM | POA: Diagnosis not present

## 2017-11-14 DIAGNOSIS — I482 Chronic atrial fibrillation: Secondary | ICD-10-CM | POA: Diagnosis not present

## 2017-11-14 DIAGNOSIS — J441 Chronic obstructive pulmonary disease with (acute) exacerbation: Secondary | ICD-10-CM | POA: Diagnosis not present

## 2017-11-14 DIAGNOSIS — I130 Hypertensive heart and chronic kidney disease with heart failure and stage 1 through stage 4 chronic kidney disease, or unspecified chronic kidney disease: Secondary | ICD-10-CM | POA: Diagnosis not present

## 2017-11-15 DIAGNOSIS — I2510 Atherosclerotic heart disease of native coronary artery without angina pectoris: Secondary | ICD-10-CM | POA: Diagnosis not present

## 2017-11-15 DIAGNOSIS — I5032 Chronic diastolic (congestive) heart failure: Secondary | ICD-10-CM | POA: Diagnosis not present

## 2017-11-15 DIAGNOSIS — J441 Chronic obstructive pulmonary disease with (acute) exacerbation: Secondary | ICD-10-CM | POA: Diagnosis not present

## 2017-11-15 DIAGNOSIS — I130 Hypertensive heart and chronic kidney disease with heart failure and stage 1 through stage 4 chronic kidney disease, or unspecified chronic kidney disease: Secondary | ICD-10-CM | POA: Diagnosis not present

## 2017-11-15 DIAGNOSIS — I482 Chronic atrial fibrillation: Secondary | ICD-10-CM | POA: Diagnosis not present

## 2017-11-15 DIAGNOSIS — S90522D Blister (nonthermal), left ankle, subsequent encounter: Secondary | ICD-10-CM | POA: Diagnosis not present

## 2017-11-17 DIAGNOSIS — I130 Hypertensive heart and chronic kidney disease with heart failure and stage 1 through stage 4 chronic kidney disease, or unspecified chronic kidney disease: Secondary | ICD-10-CM | POA: Diagnosis not present

## 2017-11-17 DIAGNOSIS — S90522D Blister (nonthermal), left ankle, subsequent encounter: Secondary | ICD-10-CM | POA: Diagnosis not present

## 2017-11-17 DIAGNOSIS — I482 Chronic atrial fibrillation: Secondary | ICD-10-CM | POA: Diagnosis not present

## 2017-11-17 DIAGNOSIS — J441 Chronic obstructive pulmonary disease with (acute) exacerbation: Secondary | ICD-10-CM | POA: Diagnosis not present

## 2017-11-17 DIAGNOSIS — I5032 Chronic diastolic (congestive) heart failure: Secondary | ICD-10-CM | POA: Diagnosis not present

## 2017-11-17 DIAGNOSIS — I2510 Atherosclerotic heart disease of native coronary artery without angina pectoris: Secondary | ICD-10-CM | POA: Diagnosis not present

## 2017-11-20 DIAGNOSIS — I130 Hypertensive heart and chronic kidney disease with heart failure and stage 1 through stage 4 chronic kidney disease, or unspecified chronic kidney disease: Secondary | ICD-10-CM | POA: Diagnosis not present

## 2017-11-20 DIAGNOSIS — I2510 Atherosclerotic heart disease of native coronary artery without angina pectoris: Secondary | ICD-10-CM | POA: Diagnosis not present

## 2017-11-20 DIAGNOSIS — I482 Chronic atrial fibrillation: Secondary | ICD-10-CM | POA: Diagnosis not present

## 2017-11-20 DIAGNOSIS — S90522D Blister (nonthermal), left ankle, subsequent encounter: Secondary | ICD-10-CM | POA: Diagnosis not present

## 2017-11-20 DIAGNOSIS — I5032 Chronic diastolic (congestive) heart failure: Secondary | ICD-10-CM | POA: Diagnosis not present

## 2017-11-20 DIAGNOSIS — J441 Chronic obstructive pulmonary disease with (acute) exacerbation: Secondary | ICD-10-CM | POA: Diagnosis not present

## 2017-11-21 DIAGNOSIS — I2510 Atherosclerotic heart disease of native coronary artery without angina pectoris: Secondary | ICD-10-CM | POA: Diagnosis not present

## 2017-11-21 DIAGNOSIS — I482 Chronic atrial fibrillation: Secondary | ICD-10-CM | POA: Diagnosis not present

## 2017-11-21 DIAGNOSIS — I130 Hypertensive heart and chronic kidney disease with heart failure and stage 1 through stage 4 chronic kidney disease, or unspecified chronic kidney disease: Secondary | ICD-10-CM | POA: Diagnosis not present

## 2017-11-21 DIAGNOSIS — I5032 Chronic diastolic (congestive) heart failure: Secondary | ICD-10-CM | POA: Diagnosis not present

## 2017-11-21 DIAGNOSIS — J441 Chronic obstructive pulmonary disease with (acute) exacerbation: Secondary | ICD-10-CM | POA: Diagnosis not present

## 2017-11-21 DIAGNOSIS — S90522D Blister (nonthermal), left ankle, subsequent encounter: Secondary | ICD-10-CM | POA: Diagnosis not present

## 2017-11-22 DIAGNOSIS — I130 Hypertensive heart and chronic kidney disease with heart failure and stage 1 through stage 4 chronic kidney disease, or unspecified chronic kidney disease: Secondary | ICD-10-CM | POA: Diagnosis not present

## 2017-11-22 DIAGNOSIS — J441 Chronic obstructive pulmonary disease with (acute) exacerbation: Secondary | ICD-10-CM | POA: Diagnosis not present

## 2017-11-22 DIAGNOSIS — I2510 Atherosclerotic heart disease of native coronary artery without angina pectoris: Secondary | ICD-10-CM | POA: Diagnosis not present

## 2017-11-22 DIAGNOSIS — I5032 Chronic diastolic (congestive) heart failure: Secondary | ICD-10-CM | POA: Diagnosis not present

## 2017-11-22 DIAGNOSIS — S90522D Blister (nonthermal), left ankle, subsequent encounter: Secondary | ICD-10-CM | POA: Diagnosis not present

## 2017-11-22 DIAGNOSIS — I482 Chronic atrial fibrillation: Secondary | ICD-10-CM | POA: Diagnosis not present

## 2017-11-23 ENCOUNTER — Ambulatory Visit (INDEPENDENT_AMBULATORY_CARE_PROVIDER_SITE_OTHER): Payer: Medicare Other | Admitting: Family Medicine

## 2017-11-23 DIAGNOSIS — S81812S Laceration without foreign body, left lower leg, sequela: Secondary | ICD-10-CM | POA: Diagnosis not present

## 2017-11-23 DIAGNOSIS — S81811A Laceration without foreign body, right lower leg, initial encounter: Secondary | ICD-10-CM | POA: Diagnosis not present

## 2017-11-23 NOTE — Patient Instructions (Signed)
Change the bandage to your leg in one week.  May take lasix when really needed for leg swelling.   Keep your legs up several hours a day.

## 2017-11-24 DIAGNOSIS — I5032 Chronic diastolic (congestive) heart failure: Secondary | ICD-10-CM | POA: Diagnosis not present

## 2017-11-24 DIAGNOSIS — J441 Chronic obstructive pulmonary disease with (acute) exacerbation: Secondary | ICD-10-CM | POA: Diagnosis not present

## 2017-11-24 DIAGNOSIS — I130 Hypertensive heart and chronic kidney disease with heart failure and stage 1 through stage 4 chronic kidney disease, or unspecified chronic kidney disease: Secondary | ICD-10-CM | POA: Diagnosis not present

## 2017-11-24 DIAGNOSIS — I2510 Atherosclerotic heart disease of native coronary artery without angina pectoris: Secondary | ICD-10-CM | POA: Diagnosis not present

## 2017-11-24 DIAGNOSIS — I482 Chronic atrial fibrillation: Secondary | ICD-10-CM | POA: Diagnosis not present

## 2017-11-24 DIAGNOSIS — S90522D Blister (nonthermal), left ankle, subsequent encounter: Secondary | ICD-10-CM | POA: Diagnosis not present

## 2017-11-27 ENCOUNTER — Ambulatory Visit: Payer: Medicare Other | Admitting: Physician Assistant

## 2017-11-27 DIAGNOSIS — S90522D Blister (nonthermal), left ankle, subsequent encounter: Secondary | ICD-10-CM | POA: Diagnosis not present

## 2017-11-27 DIAGNOSIS — I2510 Atherosclerotic heart disease of native coronary artery without angina pectoris: Secondary | ICD-10-CM | POA: Diagnosis not present

## 2017-11-27 DIAGNOSIS — J441 Chronic obstructive pulmonary disease with (acute) exacerbation: Secondary | ICD-10-CM | POA: Diagnosis not present

## 2017-11-27 DIAGNOSIS — I5032 Chronic diastolic (congestive) heart failure: Secondary | ICD-10-CM | POA: Diagnosis not present

## 2017-11-27 DIAGNOSIS — I482 Chronic atrial fibrillation: Secondary | ICD-10-CM | POA: Diagnosis not present

## 2017-11-27 DIAGNOSIS — I130 Hypertensive heart and chronic kidney disease with heart failure and stage 1 through stage 4 chronic kidney disease, or unspecified chronic kidney disease: Secondary | ICD-10-CM | POA: Diagnosis not present

## 2017-11-28 ENCOUNTER — Telehealth: Payer: Self-pay | Admitting: *Deleted

## 2017-11-28 DIAGNOSIS — M79672 Pain in left foot: Secondary | ICD-10-CM

## 2017-11-28 DIAGNOSIS — I5032 Chronic diastolic (congestive) heart failure: Secondary | ICD-10-CM | POA: Diagnosis not present

## 2017-11-28 DIAGNOSIS — I130 Hypertensive heart and chronic kidney disease with heart failure and stage 1 through stage 4 chronic kidney disease, or unspecified chronic kidney disease: Secondary | ICD-10-CM | POA: Diagnosis not present

## 2017-11-28 DIAGNOSIS — J441 Chronic obstructive pulmonary disease with (acute) exacerbation: Secondary | ICD-10-CM | POA: Diagnosis not present

## 2017-11-28 DIAGNOSIS — S90522D Blister (nonthermal), left ankle, subsequent encounter: Secondary | ICD-10-CM | POA: Diagnosis not present

## 2017-11-28 DIAGNOSIS — I2510 Atherosclerotic heart disease of native coronary artery without angina pectoris: Secondary | ICD-10-CM | POA: Diagnosis not present

## 2017-11-28 DIAGNOSIS — I482 Chronic atrial fibrillation: Secondary | ICD-10-CM | POA: Diagnosis not present

## 2017-11-28 NOTE — Telephone Encounter (Signed)
Called patient at request of nursing staff.  Previously have examined patient's bilateral lower extremities, from which she suffers from chronic edema.  Patient reports that her left foot is in an Haematologist currently.  While she was walking in her bathroom she hit some of her left foot on the side of the tub.  She has had some pain since that time.  She is worried she has a fracture.  Her home health nurse has been changing her Haematologist.  She denies fevers or chills.  Recommended patient schedule visit for this.  She would like to go to the neck near her home for an x-ray.  Discussed alternative diagnoses could be missed.  Patient will get x-ray tomorrow.  Will call with results

## 2017-11-28 NOTE — Telephone Encounter (Signed)
Pt called to let Dr. McDiarmid know that she hit the top of her foot 2 days ago getting out of the pool.  She had a large blood blister that is going down.  She states that is having trouble putting weight on it without pain. She request for an xray order to be placed for her to get.  Advised that since Dr. Wendy Poet is not here she may be requested to make an appt  ( FYI:  We have very limited openings).  She would prefer not to and ask that I send the message to Dr. Owens Shark as she saw her last month. Fleeger, Salome Spotted, CMA

## 2017-11-29 ENCOUNTER — Telehealth: Payer: Self-pay

## 2017-11-29 ENCOUNTER — Ambulatory Visit
Admission: RE | Admit: 2017-11-29 | Discharge: 2017-11-29 | Disposition: A | Discharge status: Home / Self Care | Payer: Medicare Other | Source: Ambulatory Visit | Attending: Family Medicine | Admitting: Family Medicine

## 2017-11-29 DIAGNOSIS — S99922A Unspecified injury of left foot, initial encounter: Secondary | ICD-10-CM | POA: Diagnosis not present

## 2017-11-29 DIAGNOSIS — I482 Chronic atrial fibrillation: Secondary | ICD-10-CM | POA: Diagnosis not present

## 2017-11-29 DIAGNOSIS — M79672 Pain in left foot: Secondary | ICD-10-CM | POA: Diagnosis not present

## 2017-11-29 DIAGNOSIS — I2510 Atherosclerotic heart disease of native coronary artery without angina pectoris: Secondary | ICD-10-CM | POA: Diagnosis not present

## 2017-11-29 DIAGNOSIS — J441 Chronic obstructive pulmonary disease with (acute) exacerbation: Secondary | ICD-10-CM | POA: Diagnosis not present

## 2017-11-29 DIAGNOSIS — S90522D Blister (nonthermal), left ankle, subsequent encounter: Secondary | ICD-10-CM | POA: Diagnosis not present

## 2017-11-29 DIAGNOSIS — I130 Hypertensive heart and chronic kidney disease with heart failure and stage 1 through stage 4 chronic kidney disease, or unspecified chronic kidney disease: Secondary | ICD-10-CM | POA: Diagnosis not present

## 2017-11-29 DIAGNOSIS — R600 Localized edema: Secondary | ICD-10-CM | POA: Diagnosis not present

## 2017-11-29 DIAGNOSIS — I5032 Chronic diastolic (congestive) heart failure: Secondary | ICD-10-CM | POA: Diagnosis not present

## 2017-11-29 DIAGNOSIS — M7989 Other specified soft tissue disorders: Secondary | ICD-10-CM | POA: Diagnosis not present

## 2017-11-29 NOTE — Telephone Encounter (Signed)
Betsy, PT, LVM on nurse line asking for the status of xrays. Per chart review, the xrays have been ordered, pt just need to go and have them done. I called Betsy to inform of this, I had to LVM.   Besty, PT, also requesting VO to extend PT for 2x week for 3 weeks and 1x a week 1 week.  Please call her to confirm. [phone].

## 2017-11-29 NOTE — Telephone Encounter (Signed)
Looks good

## 2017-11-30 ENCOUNTER — Telehealth: Payer: Self-pay | Admitting: Family Medicine

## 2017-11-30 NOTE — Telephone Encounter (Signed)
Daughter is aware and will tell mother.  Devantae Babe,CMA

## 2017-11-30 NOTE — Telephone Encounter (Signed)
Nursing- please call patient and let her know her x-ray shows some swelling of the left ankle and foot. No fracture. I recommend the following:  1. If her Unna boot is off, she can use ice twice per day 2. Keep elevated while sitting 3. She can use Tylenol up to 3,000 mg per day for pain.   Happy to chat if she has questions.  Dorris Singh, MD  Family Medicine Teaching Service

## 2017-12-04 DIAGNOSIS — J441 Chronic obstructive pulmonary disease with (acute) exacerbation: Secondary | ICD-10-CM | POA: Diagnosis not present

## 2017-12-04 DIAGNOSIS — I482 Chronic atrial fibrillation: Secondary | ICD-10-CM | POA: Diagnosis not present

## 2017-12-04 DIAGNOSIS — I130 Hypertensive heart and chronic kidney disease with heart failure and stage 1 through stage 4 chronic kidney disease, or unspecified chronic kidney disease: Secondary | ICD-10-CM | POA: Diagnosis not present

## 2017-12-04 DIAGNOSIS — I5032 Chronic diastolic (congestive) heart failure: Secondary | ICD-10-CM | POA: Diagnosis not present

## 2017-12-04 DIAGNOSIS — I2510 Atherosclerotic heart disease of native coronary artery without angina pectoris: Secondary | ICD-10-CM | POA: Diagnosis not present

## 2017-12-04 DIAGNOSIS — S90522D Blister (nonthermal), left ankle, subsequent encounter: Secondary | ICD-10-CM | POA: Diagnosis not present

## 2017-12-04 NOTE — Progress Notes (Signed)
Wound recheck Healed left leg wound - wound site closed by secondary intent successfully  Struck right elg on furniture two days ago.  tx'd with H2O2. Bleeding stopped spontaneously.  No fever.  No night sweats.  No leg pain.   New right proximal right foreleg superficial laceration 3-4 cm length, anterolateral location foreleg. No increased wrmth, minimal tenderness, no drainage or discharge.  Adherent blood clotting.  Assessment/Plan 1) Healed left leg wound 2) New right leg wound, superficial, not infected. - Plan: colloidal dressing applied weekly until healed.  Instruction on signs and sxs of wound infection.  Return to clinic for evidence wound infection or persistent bleeding.  Anticipate wound closure be secondary intent in 2 to 3 weeks if no further injury to site occurs.

## 2017-12-05 DIAGNOSIS — I482 Chronic atrial fibrillation: Secondary | ICD-10-CM | POA: Diagnosis not present

## 2017-12-05 DIAGNOSIS — I2510 Atherosclerotic heart disease of native coronary artery without angina pectoris: Secondary | ICD-10-CM | POA: Diagnosis not present

## 2017-12-05 DIAGNOSIS — I5032 Chronic diastolic (congestive) heart failure: Secondary | ICD-10-CM | POA: Diagnosis not present

## 2017-12-05 DIAGNOSIS — J441 Chronic obstructive pulmonary disease with (acute) exacerbation: Secondary | ICD-10-CM | POA: Diagnosis not present

## 2017-12-05 DIAGNOSIS — I130 Hypertensive heart and chronic kidney disease with heart failure and stage 1 through stage 4 chronic kidney disease, or unspecified chronic kidney disease: Secondary | ICD-10-CM | POA: Diagnosis not present

## 2017-12-05 DIAGNOSIS — S90522D Blister (nonthermal), left ankle, subsequent encounter: Secondary | ICD-10-CM | POA: Diagnosis not present

## 2017-12-05 DIAGNOSIS — H353221 Exudative age-related macular degeneration, left eye, with active choroidal neovascularization: Secondary | ICD-10-CM | POA: Diagnosis not present

## 2017-12-06 ENCOUNTER — Ambulatory Visit (INDEPENDENT_AMBULATORY_CARE_PROVIDER_SITE_OTHER): Payer: Medicare Other | Admitting: Family Medicine

## 2017-12-06 ENCOUNTER — Other Ambulatory Visit: Payer: Self-pay

## 2017-12-06 VITALS — BP 126/80 | HR 118 | Temp 97.4°F | Ht 65.0 in | Wt 180.6 lb

## 2017-12-06 DIAGNOSIS — I482 Chronic atrial fibrillation: Secondary | ICD-10-CM | POA: Diagnosis not present

## 2017-12-06 DIAGNOSIS — I872 Venous insufficiency (chronic) (peripheral): Secondary | ICD-10-CM

## 2017-12-06 DIAGNOSIS — I130 Hypertensive heart and chronic kidney disease with heart failure and stage 1 through stage 4 chronic kidney disease, or unspecified chronic kidney disease: Secondary | ICD-10-CM | POA: Diagnosis not present

## 2017-12-06 DIAGNOSIS — S90522D Blister (nonthermal), left ankle, subsequent encounter: Secondary | ICD-10-CM | POA: Diagnosis not present

## 2017-12-06 DIAGNOSIS — J441 Chronic obstructive pulmonary disease with (acute) exacerbation: Secondary | ICD-10-CM | POA: Diagnosis not present

## 2017-12-06 DIAGNOSIS — S9032XA Contusion of left foot, initial encounter: Secondary | ICD-10-CM | POA: Diagnosis not present

## 2017-12-06 DIAGNOSIS — I5032 Chronic diastolic (congestive) heart failure: Secondary | ICD-10-CM | POA: Diagnosis not present

## 2017-12-06 DIAGNOSIS — I2510 Atherosclerotic heart disease of native coronary artery without angina pectoris: Secondary | ICD-10-CM | POA: Diagnosis not present

## 2017-12-06 NOTE — Progress Notes (Signed)
    Subjective:    Patient ID: [name], female    DOB: February 10, 1924, 84 y.o.   MRN: [id number]   CC: blister on foot  HPI: Patient coming in to have blister on her left foot looked at. She had hit her foot on the bathtub a few weeks ago and had significant pain to the point where she had it x-rayed. X-ray was negative for fracture. She developed a hematoma at the top of her foot that is tender. She reports it also had what looked like a fluid filled blister on top of the hematoma that burst.   She has significant LE swelling bilaterally which has been followed by her PCP. She has tried to put on compression socks but is unable to pull them up. She is interested in seeing a vein specialist.   HH comes out to see her at home to help her, she lives alone. She has a [place] that comes once a week and PT that comes out twice a week. Her daughter lives nearby and checks on her frequently.   Smoking status reviewed- former smoker  Review of Systems   Objective:  BP 126/80   Pulse (!) 118   Temp (!) 97.4 F (36.3 C) (Oral)   Ht 5\' 5"  (1.651 m)   Wt 180 lb 9.6 oz (81.9 kg)   SpO2 93%   BMI 30.05 kg/m  Vitals and nursing note reviewed  General: well appearing, in no acute distress HEENT: normocephalic, MMM Cardiac: tachycardic, faint DP pulses bilaterally Respiratory: no increased work of breathing, speaks in full sentences Extremities: +3 pitting edema of LE bilaterally. There is an ecchymosis on the dorsal aspect of her left foot without surrounding erythema or drainage from blister on top of ecchymosis. It is tender to palpation, no fluctuance appreciated. Calves are symmetric in size without increased warmth or redness.  Skin: warm and dry, chronic skin changes over LE bilaterally  Neuro: alert and oriented, no focal deficits   Assessment & Plan:    Traumatic ecchymosis of left foot  There appears to be a resolving ecchymosis on her dorsal left foot that has been exacerbated  by significant LE edema causing a water blister to form and pop. I believe the swelling is what is causing her pain. Foot x-ray from 11/29/17 revealed no fracture but significant swelling of foot and ankle. The blister that has ruptured on its own does not appear infected in any way. Asked patient to monitor closely for any spreading redness or drainage, but overall reassured her this will resolve on its own but may take a long time to do so. Her home health nurse can assess weekly at home as well. Follow up appt made with PCP in 3 weeks to monitor.   Venous insufficiency of both lower extremities  Both legs with pitting edema. I believe the swelling of the left leg/foot is causing her pain on the area she injured. I lightly wrapped her left leg with an ace wrap for some compression to help with swelling. Advised her to limit salt in diet and keep legs elevated as much as possible. She is unable to wear compression socks. I referred her to [facility] per her request for consultation about venous insufficiency. Follow up 3 weeks with PCP.     Return in about 3 weeks (around 12/27/2017).   Lucila Maine, DO Family Medicine Resident PGY-3

## 2017-12-06 NOTE — Patient Instructions (Signed)
  It was great seeing you today!  I have also referred you to vascular vein surgery for management of LE swelling. You will receive a call from our office to schedule this appointment. If you do not hear from our office in 1-2 weeks please call us to check on the status of your referral at [phone].  If you have questions or concerns please do not hesitate to call at [phone].  Lucila Maine, DO PGY-3, [facility] 12/06/2017 3:26 PM

## 2017-12-07 ENCOUNTER — Other Ambulatory Visit: Payer: Self-pay

## 2017-12-07 ENCOUNTER — Encounter: Payer: Self-pay | Admitting: Family Medicine

## 2017-12-07 DIAGNOSIS — S9032XA Contusion of left foot, initial encounter: Secondary | ICD-10-CM

## 2017-12-07 HISTORY — DX: Contusion of left foot, initial encounter: S90.32XA

## 2017-12-07 NOTE — Assessment & Plan Note (Signed)
  Both legs with pitting edema. I believe the swelling of the left leg/foot is causing her pain on the area she injured. I lightly wrapped her left leg with an ace wrap for some compression to help with swelling. Advised her to limit salt in diet and keep legs elevated as much as possible. She is unable to wear compression socks. I referred her to [facility] per her request for consultation about venous insufficiency. Follow up 3 weeks with PCP.

## 2017-12-07 NOTE — Patient Outreach (Signed)
[facility]) Care Management  12/07/2017  TU SHIMMEL 01-Jan-1924 [id number]    Telephone outreach to the patient for assessment.  No answer. HIPAA compliant voicemail left with contact information.  Plan: RN Health Coach will make another outreach attempt to the patient within thirty days.  Lazaro Arms RN, BSN, [place] Direct Dial:  [phone]  Fax: [phone]

## 2017-12-07 NOTE — Assessment & Plan Note (Addendum)
  There appears to be a resolving ecchymosis on her dorsal left foot that has been exacerbated by significant LE edema causing a water blister to form and pop. I believe the swelling is what is causing her pain. Foot x-ray from 11/29/17 revealed no fracture but significant swelling of foot and ankle. The blister that has ruptured on its own does not appear infected in any way. Asked patient to monitor closely for any spreading redness or drainage, but overall reassured her this will resolve on its own but may take a long time to do so. Her home health nurse can assess weekly at home as well. Follow up appt made with PCP in 3 weeks to monitor.

## 2017-12-11 DIAGNOSIS — I482 Chronic atrial fibrillation: Secondary | ICD-10-CM | POA: Diagnosis not present

## 2017-12-11 DIAGNOSIS — J441 Chronic obstructive pulmonary disease with (acute) exacerbation: Secondary | ICD-10-CM | POA: Diagnosis not present

## 2017-12-11 DIAGNOSIS — S90522D Blister (nonthermal), left ankle, subsequent encounter: Secondary | ICD-10-CM | POA: Diagnosis not present

## 2017-12-11 DIAGNOSIS — I2510 Atherosclerotic heart disease of native coronary artery without angina pectoris: Secondary | ICD-10-CM | POA: Diagnosis not present

## 2017-12-11 DIAGNOSIS — I5032 Chronic diastolic (congestive) heart failure: Secondary | ICD-10-CM | POA: Diagnosis not present

## 2017-12-11 DIAGNOSIS — I130 Hypertensive heart and chronic kidney disease with heart failure and stage 1 through stage 4 chronic kidney disease, or unspecified chronic kidney disease: Secondary | ICD-10-CM | POA: Diagnosis not present

## 2017-12-12 DIAGNOSIS — H353221 Exudative age-related macular degeneration, left eye, with active choroidal neovascularization: Secondary | ICD-10-CM | POA: Diagnosis not present

## 2017-12-12 DIAGNOSIS — H353211 Exudative age-related macular degeneration, right eye, with active choroidal neovascularization: Secondary | ICD-10-CM | POA: Diagnosis not present

## 2017-12-13 ENCOUNTER — Ambulatory Visit (INDEPENDENT_AMBULATORY_CARE_PROVIDER_SITE_OTHER): Payer: Medicare Other | Admitting: Emergency Medicine

## 2017-12-13 ENCOUNTER — Encounter: Payer: Self-pay | Admitting: Emergency Medicine

## 2017-12-13 DIAGNOSIS — J441 Chronic obstructive pulmonary disease with (acute) exacerbation: Secondary | ICD-10-CM | POA: Diagnosis not present

## 2017-12-13 DIAGNOSIS — S90522D Blister (nonthermal), left ankle, subsequent encounter: Secondary | ICD-10-CM | POA: Diagnosis not present

## 2017-12-13 DIAGNOSIS — I2510 Atherosclerotic heart disease of native coronary artery without angina pectoris: Secondary | ICD-10-CM | POA: Diagnosis not present

## 2017-12-13 DIAGNOSIS — I130 Hypertensive heart and chronic kidney disease with heart failure and stage 1 through stage 4 chronic kidney disease, or unspecified chronic kidney disease: Secondary | ICD-10-CM | POA: Diagnosis not present

## 2017-12-13 DIAGNOSIS — I482 Chronic atrial fibrillation: Secondary | ICD-10-CM | POA: Diagnosis not present

## 2017-12-13 DIAGNOSIS — I5032 Chronic diastolic (congestive) heart failure: Secondary | ICD-10-CM | POA: Diagnosis not present

## 2017-12-13 DIAGNOSIS — J449 Chronic obstructive pulmonary disease, unspecified: Secondary | ICD-10-CM

## 2017-12-13 MED ORDER — AZITHROMYCIN 250 MG PO TABS: ORAL_TABLET | ORAL | 2 refills | Status: DC

## 2017-12-13 MED ORDER — CEFUROXIME AXETIL 250 MG PO TABS: 250.0000 mg | ORAL_TABLET | Freq: Two times a day (BID) | ORAL | 2 refills | Status: DC

## 2017-12-13 MED ORDER — DOXYCYCLINE HYCLATE 100 MG PO TABS: 100.0000 mg | ORAL_TABLET | Freq: Two times a day (BID) | ORAL | 2 refills | Status: DC

## 2017-12-13 NOTE — Addendum Note (Signed)
Addended by: Desmond Dike C on: 12/13/2017 04:39 PM   Modules accepted: Orders

## 2017-12-13 NOTE — Patient Instructions (Addendum)
Please continue Trelegy 1 inhalation every day.  Remember to rinse and gargle after using. Keep your DuoNeb available to use 1 nebulizer treatment up to every 6 hours if needed for shortness of breath, wheezing, chest tightness. We will try starting rotating antibiotics during the first week of each month as follows: -Azithromycin 250 mg daily for 5 days -Doxycycline 100 mg twice a day for 7 days -Ceftin 250 mg twice a day for 7 days Follow with Dr Lamonte Sakai in 2 months or sooner if you have any problems.

## 2017-12-13 NOTE — Assessment & Plan Note (Signed)
Symptoms are progressively bothersome especially exertional dyspnea, mucus burden.  We talked today about possibly adding Daliresp, daily prednisone.  She would like to avoid prednisone if at all possible given the side effects.  I understand this.  I think will be reasonable to try a regimen of rotating antibiotics for the first week of each month to see if this helps with her mucus burden, bronchitic symptoms.  Continue her other bronchodilators as ordered.  We will follow-up in 2 months to assess progress, consider changes as outlined above.  Please continue Trelegy 1 inhalation every day.  Remember to rinse and gargle after using. Keep your DuoNeb available to use 1 nebulizer treatment up to every 6 hours if needed for shortness of breath, wheezing, chest tightness. We will try starting rotating antibiotics during the first week of each month as follows: -Azithromycin 250 mg daily for 5 days -Doxycycline 100 mg twice a day for 7 days -Ceftin 250 mg twice a day for 7 days Follow with Dr Lamonte Sakai in 2 months or sooner if you have any problems.

## 2017-12-13 NOTE — Progress Notes (Signed)
GERD  Subjective:    Patient ID: [name], female    DOB: Apr 10, 1923, 84 y.o.   MRN: [id number]  COPD  She complains of shortness of breath. There is no cough or wheezing. Pertinent negatives include no chest pain, ear pain, fever, headaches, postnasal drip, rhinorrhea, sneezing, sore throat or trouble swallowing. Her past medical history is significant for COPD.    ROV 03/30/17 --Very pleasant 84 year old woman with a history of former tobacco, atrial fibrillation with sick sinus syndrome, hypertension, chronic kidney disease, GERD with a hiatal hernia.  We follow her for her history of tobacco use and COPD.  She has been managed on Trelegy.  She uses fluticasone nasal spray for chronic allergic rhinitis, Prilosec 20 mg daily for GERD.  Uses albuterol rarely until the last week. She usually stays with a daughter, but has been at home on her own since last week. She reports an increase in exertional SOB over the last 10 days, increased congestion and cough, productive of greenish phlegm. Unclear whether there was a trigger - no URI sx, etc.   ROV 06/08/17 --[name] follows up today for her severe COPD.  She is 54, also has atrial fibrillation with sick sinus syndrome, hypertension, chronic kidney disease, GERD with a hiatal hernia.  We have been managing her on Trelegy. She has been living back and forth between her home and her daughter Lisa's home. She was at the beach 2 weeks ago, was having more dyspnea and fatigue, increased wheeze, minimal cough, minimal mucous. She had to use her albuterol more frequently. She has improved now. Did not have to get pred or abx. ? Whether her Trelegy was giving her full dose. She is planning to go on cruise next Saturday.   Acute OV 08/24/17 --and has severe COPD, atrial fibrillation with sick sinus syndrome, hypertension, chronic kidney disease and a hiatal hernia with GERD.  We have been managing her on Trelegy,  But she isn't sure that she is getting it down  adequately.  She is been having a hard time over the last several weeks. She was treated with pred about a month ago, then I gave her doxycycline 6/19 for purulent mucous - has cleared some. I then gave her another pred taper earlier this week for progressive dyspnea and wheeze. She is using albuterol about 2x a day. She wonders if nebs would be better.   ROV 10/03/17 --follow-up visit for patient with severe COPD.  She has chronic bronchitic symptoms, associated cough, wheeze, exertional dyspnea.  She also has a history of atrial fibrillation, hypertension, chronic kidney disease, hiatal hernia and GERD.  She has been treated with rounds of doxycycline and prednisone over the last 2 months.  She was admitted 7/2 for an acute COPD exacerbation, finished pred early July. I temporarily stopped her Trelegy to see if she would benefit more from nebulized therapy. She is now back on trelegy, is doing duoneb bid as well. She is using budesonide nebs as well.   She is having more symptoms now - more dyspnea, especially when she is outside. Her cough is stable. She has started doing pursed-lip breathing.   ROV 12/13/17 --84 year old woman, follows up today for evaluation of her severe COPD with frequent bronchitic symptoms, chronic cough.  She also has a history of atrial fibrillation, hypertension, chronic kidney disease and a hiatal hernia with GERD.  She was admitted in August with acute dyspnea most consistent with exacerbation of diastolic CHF.  She improved  with diuresis.  We have been managing her on Trelegy since last visit. She uses DuoNeb about 3x a day. She has severe exertional SOB even with 30-40 ft. Coughs all day, prod of yellowish.    Review of Systems  Constitutional: Negative for fever and unexpected weight change.  HENT: Positive for congestion. Negative for dental problem, ear pain, nosebleeds, postnasal drip, rhinorrhea, sinus pressure, sneezing, sore throat and trouble swallowing.   Eyes:  Negative for redness and itching.  Respiratory: Positive for shortness of breath. Negative for cough, chest tightness and wheezing.   Cardiovascular: Positive for palpitations. Negative for chest pain and leg swelling.  Gastrointestinal: Negative for nausea and vomiting.  Genitourinary: Negative for dysuria.  Musculoskeletal: Negative for joint swelling.  Skin: Negative for rash.  Neurological: Negative for headaches.  Hematological: Does not bruise/bleed easily.  Psychiatric/Behavioral: Negative for dysphoric mood. The patient is nervous/anxious.         Objective:   Physical Exam  Vitals:   12/13/17 1602  BP: 136/80  Pulse: 78  SpO2: 95%  Weight: 81.5 kg  Height: 5\' 5"  (1.651 m)   Gen: Pleasant, elderly woman in no distress,  normal affect  ENT: No lesions,  mouth clear,  oropharynx clear, no postnasal drip, no stridor  Neck: No JVD, no stridor  Lungs: No use of accessory muscles, coarse B breath sounds, few scattered wheezes.   Cardiovascular: Irregular, heart sounds normal, no murmur or gallops, no peripheral edema  Musculoskeletal: No deformities, no cyanosis or clubbing  Neuro: alert, non focal  Skin: Warm, no lesions or rashes      Assessment & Plan:  COPD, severe (HCC) Symptoms are progressively bothersome especially exertional dyspnea, mucus burden.  We talked today about possibly adding Daliresp, daily prednisone.  She would like to avoid prednisone if at all possible given the side effects.  I understand this.  I think will be reasonable to try a regimen of rotating antibiotics for the first week of each month to see if this helps with her mucus burden, bronchitic symptoms.  Continue her other bronchodilators as ordered.  We will follow-up in 2 months to assess progress, consider changes as outlined above.  Please continue Trelegy 1 inhalation every day.  Remember to rinse and gargle after using. Keep your DuoNeb available to use 1 nebulizer treatment up to  every 6 hours if needed for shortness of breath, wheezing, chest tightness. We will try starting rotating antibiotics during the first week of each month as follows: -Azithromycin 250 mg daily for 5 days -Doxycycline 100 mg twice a day for 7 days -Ceftin 250 mg twice a day for 7 days Follow with Dr Lamonte Sakai in 2 months or sooner if you have any problems.  Baltazar Apo, MD, PhD 12/13/2017, 4:29 PM [facility] [phone] or if no answer [phone]

## 2017-12-15 DIAGNOSIS — I482 Chronic atrial fibrillation: Secondary | ICD-10-CM | POA: Diagnosis not present

## 2017-12-15 DIAGNOSIS — I2510 Atherosclerotic heart disease of native coronary artery without angina pectoris: Secondary | ICD-10-CM | POA: Diagnosis not present

## 2017-12-15 DIAGNOSIS — I5032 Chronic diastolic (congestive) heart failure: Secondary | ICD-10-CM | POA: Diagnosis not present

## 2017-12-15 DIAGNOSIS — I130 Hypertensive heart and chronic kidney disease with heart failure and stage 1 through stage 4 chronic kidney disease, or unspecified chronic kidney disease: Secondary | ICD-10-CM | POA: Diagnosis not present

## 2017-12-15 DIAGNOSIS — S90522D Blister (nonthermal), left ankle, subsequent encounter: Secondary | ICD-10-CM | POA: Diagnosis not present

## 2017-12-15 DIAGNOSIS — J441 Chronic obstructive pulmonary disease with (acute) exacerbation: Secondary | ICD-10-CM | POA: Diagnosis not present

## 2017-12-19 DIAGNOSIS — I482 Chronic atrial fibrillation: Secondary | ICD-10-CM | POA: Diagnosis not present

## 2017-12-19 DIAGNOSIS — J441 Chronic obstructive pulmonary disease with (acute) exacerbation: Secondary | ICD-10-CM | POA: Diagnosis not present

## 2017-12-19 DIAGNOSIS — I2510 Atherosclerotic heart disease of native coronary artery without angina pectoris: Secondary | ICD-10-CM | POA: Diagnosis not present

## 2017-12-19 DIAGNOSIS — I130 Hypertensive heart and chronic kidney disease with heart failure and stage 1 through stage 4 chronic kidney disease, or unspecified chronic kidney disease: Secondary | ICD-10-CM | POA: Diagnosis not present

## 2017-12-19 DIAGNOSIS — S90522D Blister (nonthermal), left ankle, subsequent encounter: Secondary | ICD-10-CM | POA: Diagnosis not present

## 2017-12-19 DIAGNOSIS — I5032 Chronic diastolic (congestive) heart failure: Secondary | ICD-10-CM | POA: Diagnosis not present

## 2017-12-20 DIAGNOSIS — I5032 Chronic diastolic (congestive) heart failure: Secondary | ICD-10-CM | POA: Diagnosis not present

## 2017-12-20 DIAGNOSIS — I130 Hypertensive heart and chronic kidney disease with heart failure and stage 1 through stage 4 chronic kidney disease, or unspecified chronic kidney disease: Secondary | ICD-10-CM | POA: Diagnosis not present

## 2017-12-20 DIAGNOSIS — I2510 Atherosclerotic heart disease of native coronary artery without angina pectoris: Secondary | ICD-10-CM | POA: Diagnosis not present

## 2017-12-20 DIAGNOSIS — J441 Chronic obstructive pulmonary disease with (acute) exacerbation: Secondary | ICD-10-CM | POA: Diagnosis not present

## 2017-12-20 DIAGNOSIS — I482 Chronic atrial fibrillation: Secondary | ICD-10-CM | POA: Diagnosis not present

## 2017-12-20 DIAGNOSIS — S90522D Blister (nonthermal), left ankle, subsequent encounter: Secondary | ICD-10-CM | POA: Diagnosis not present

## 2017-12-22 ENCOUNTER — Other Ambulatory Visit: Payer: Self-pay

## 2017-12-22 NOTE — Patient Outreach (Signed)
[facility]) Care Management  12/22/2017  JALEE SAINE 06-03-1923 [id number]    2nd attempt to outreach the patient for assessment. No answer. HIPAA compliant voicemai lleft with contact information.  Plan: RN Health Coach will send letter. If no response to calls and letter in ten business days [place] will proceed with case closure.    Lazaro Arms RN, BSN, [place] Direct Dial:  [phone]  Fax: [phone]

## 2017-12-25 ENCOUNTER — Other Ambulatory Visit: Payer: Self-pay

## 2017-12-25 DIAGNOSIS — I482 Chronic atrial fibrillation: Secondary | ICD-10-CM | POA: Diagnosis not present

## 2017-12-25 DIAGNOSIS — S90522D Blister (nonthermal), left ankle, subsequent encounter: Secondary | ICD-10-CM | POA: Diagnosis not present

## 2017-12-25 DIAGNOSIS — I130 Hypertensive heart and chronic kidney disease with heart failure and stage 1 through stage 4 chronic kidney disease, or unspecified chronic kidney disease: Secondary | ICD-10-CM | POA: Diagnosis not present

## 2017-12-25 DIAGNOSIS — I5032 Chronic diastolic (congestive) heart failure: Secondary | ICD-10-CM | POA: Diagnosis not present

## 2017-12-25 DIAGNOSIS — J441 Chronic obstructive pulmonary disease with (acute) exacerbation: Secondary | ICD-10-CM | POA: Diagnosis not present

## 2017-12-25 DIAGNOSIS — I2510 Atherosclerotic heart disease of native coronary artery without angina pectoris: Secondary | ICD-10-CM | POA: Diagnosis not present

## 2017-12-25 NOTE — Patient Outreach (Signed)
[facility]) Care Management  12/25/2017   [name] 06-08-1923 [id number]  Subjective: Received call from the patient for assessment. HIPAA verified.  The patient states that she did not have a good weekend.  She states that she is having pain in her left food.  It is limiting her movement.  She states that it is not painful when she is sitting but very painful when she tries to walk.  She rates the pain at a 6/10.  She has an appointment with Dr. McDiarmid this Thursday.  I advised the patient that she needs to stay off her foot as much as possible. To keep her foot elevated and she could use an ice pack for pain.  The patient verbalized understanding.  The patient denies any shortness of breath, wheezing or falls.  She is monitoring her diet and taking medications as prescribed.  Current Medications:  Current Outpatient Medications  Medication Sig Dispense Refill  . albuterol (VENTOLIN HFA) 108 (90 Base) MCG/ACT inhaler Inhale 2 puffs into the lungs every 6 (six) hours as needed for wheezing or shortness of breath.    Marland Kitchen amiodarone (PACERONE) 200 MG tablet Take one tablet by mouth daily Monday through Friday.  Do not take on Saturday or Sunday. 90 tablet 3  . apixaban (ELIQUIS) 2.5 MG TABS tablet Take 1 tablet (2.5 mg total) by mouth 2 (two) times daily. 60 tablet 10  . atorvastatin (LIPITOR) 20 MG tablet TAKE 1 TABLET BY MOUTH DAILY 90 tablet 3  . budesonide (PULMICORT) 0.5 MG/2ML nebulizer solution Take 2 mLs (0.5 mg total) by nebulization 2 (two) times daily. 120 mL 12  . fluticasone (FLONASE) 50 MCG/ACT nasal spray SHAKE LIQUID AND USE 2 SPRAYS IN EACH NOSTRIL DAILY 48 g 3  . furosemide (LASIX) 20 MG tablet Take 1 tablet (20 mg total) by mouth daily. 90 tablet 3  . ipratropium-albuterol (DUONEB) 0.5-2.5 (3) MG/3ML SOLN USE 3 ML VIA NEBULIZER EVERY 4 HOURS 540 mL 0  . metoprolol tartrate (LOPRESSOR) 50 MG tablet Take 1 tablet by mouth daily.    . nitroGLYCERIN  (NITROSTAT) 0.4 MG SL tablet Place 1 tablet (0.4 mg total) under the tongue every 5 (five) minutes as needed for chest pain (x 3 tabs). 25 tablet PRN  . omeprazole (PRILOSEC) 20 MG capsule Take 1 capsule (20 mg total) by mouth daily. 90 capsule 3  . traMADol (ULTRAM) 50 MG tablet TAKE 1 TABLET BY MOUTH EVERY 6 HOURS AS NEEDED FOR PAIN 30 tablet 5  . vitamin B-12 (CYANOCOBALAMIN) 1000 MCG tablet Take 1 tablet (1,000 mcg total) by mouth daily. 90 tablet 3  . albuterol (PROVENTIL) (2.5 MG/3ML) 0.083% nebulizer solution Take 3 mLs (2.5 mg total) by nebulization every 4 (four) hours as needed for up to 7 days for wheezing or shortness of breath. 150 mL 1  . azithromycin (ZITHROMAX) 250 MG tablet Take one tablet daily for 5 days (Patient not taking: Reported on 12/25/2017) 5 each 2  . cefUROXime (CEFTIN) 250 MG tablet Take 1 tablet (250 mg total) by mouth 2 (two) times daily with a meal. (Patient not taking: Reported on 12/25/2017) 14 tablet 2  . doxycycline (VIBRA-TABS) 100 MG tablet Take 1 tablet (100 mg total) by mouth 2 (two) times daily. (Patient not taking: Reported on 12/25/2017) 14 tablet 2  . predniSONE (DELTASONE) 20 MG tablet Take 2 tablets (40 mg total) by mouth daily. Take for 7 days (Patient not taking: Reported on 12/25/2017) 14 tablet 0  No current facility-administered medications for this visit.     Functional Status:  In your present state of health, do you have any difficulty performing the following activities: 10/16/2017 10/16/2017  Hearing? - N  Vision? - N  Difficulty concentrating or making decisions? - N  Walking or climbing stairs? - N  Dressing or bathing? - N  Doing errands, shopping? N -  Some recent data might be hidden    Fall/Depression Screening: Fall Risk  12/25/2017 12/06/2017 11/02/2017  Falls in the past year? No Yes Yes  Comment - - -  Number falls in past yr: - 2 or more 2 or more  Injury with Fall? - Yes Yes  Comment - - -  Risk Factor Category  - High  Fall Risk High Fall Risk  Risk for fall due to : - History of fall(s) History of fall(s)  Follow up - - -   PHQ 2/9 Scores 12/06/2017 11/02/2017 09/21/2017 09/15/2017 06/29/2017 06/08/2017 01/12/2017  PHQ - 2 Score 0 0 0 0 0 3 4  PHQ- 9 Score - - - - - 5 6    Assessment: Patient will continue to benefit from health coach outreach for disease management and support.  [facility] CM Care Plan Problem One     Most Recent Value  [facility] Long Term Goal   in 60 days the patient will not have any hospital admissions  [facility] Long Term Goal Start Date  12/25/17  Interventions for Problem One Long Term Goal  Discussed signs and syptoms of copd, medication adherence and diet.     Plan: RN Health Coach will contact patient in the month of December and patient agrees to next outreach.  Lazaro Arms RN, BSN, [place] Direct Dial:  [phone]  Fax: [phone]

## 2017-12-26 DIAGNOSIS — I5032 Chronic diastolic (congestive) heart failure: Secondary | ICD-10-CM | POA: Diagnosis not present

## 2017-12-26 DIAGNOSIS — I2510 Atherosclerotic heart disease of native coronary artery without angina pectoris: Secondary | ICD-10-CM | POA: Diagnosis not present

## 2017-12-26 DIAGNOSIS — J441 Chronic obstructive pulmonary disease with (acute) exacerbation: Secondary | ICD-10-CM | POA: Diagnosis not present

## 2017-12-26 DIAGNOSIS — I482 Chronic atrial fibrillation: Secondary | ICD-10-CM | POA: Diagnosis not present

## 2017-12-26 DIAGNOSIS — I130 Hypertensive heart and chronic kidney disease with heart failure and stage 1 through stage 4 chronic kidney disease, or unspecified chronic kidney disease: Secondary | ICD-10-CM | POA: Diagnosis not present

## 2017-12-26 DIAGNOSIS — S90522D Blister (nonthermal), left ankle, subsequent encounter: Secondary | ICD-10-CM | POA: Diagnosis not present

## 2017-12-28 ENCOUNTER — Other Ambulatory Visit: Payer: Self-pay

## 2017-12-28 ENCOUNTER — Encounter: Payer: Self-pay | Admitting: Family Medicine

## 2017-12-28 ENCOUNTER — Ambulatory Visit (INDEPENDENT_AMBULATORY_CARE_PROVIDER_SITE_OTHER): Payer: Medicare Other | Admitting: Family Medicine

## 2017-12-28 VITALS — BP 134/82 | HR 52 | Temp 97.9°F | Ht 65.0 in | Wt 176.0 lb

## 2017-12-28 DIAGNOSIS — I83023 Varicose veins of left lower extremity with ulcer of ankle: Secondary | ICD-10-CM

## 2017-12-28 DIAGNOSIS — L97329 Non-pressure chronic ulcer of left ankle with unspecified severity: Secondary | ICD-10-CM

## 2017-12-28 DIAGNOSIS — Z79899 Other long term (current) drug therapy: Secondary | ICD-10-CM

## 2017-12-28 DIAGNOSIS — Z9189 Other specified personal risk factors, not elsewhere classified: Secondary | ICD-10-CM

## 2017-12-28 DIAGNOSIS — I872 Venous insufficiency (chronic) (peripheral): Secondary | ICD-10-CM

## 2017-12-28 DIAGNOSIS — Z8639 Personal history of other endocrine, nutritional and metabolic disease: Secondary | ICD-10-CM

## 2017-12-28 DIAGNOSIS — N183 Chronic kidney disease, stage 3 unspecified: Secondary | ICD-10-CM

## 2017-12-28 DIAGNOSIS — S91302A Unspecified open wound, left foot, initial encounter: Secondary | ICD-10-CM

## 2017-12-28 DIAGNOSIS — N184 Chronic kidney disease, stage 4 (severe): Secondary | ICD-10-CM | POA: Diagnosis not present

## 2017-12-28 LAB — POCT GLYCOSYLATED HEMOGLOBIN (HGB A1C): HbA1c, POC (controlled diabetic range): 5.5 % (ref 0.0–7.0)

## 2017-12-28 NOTE — Patient Instructions (Addendum)
For the foot sore: Change the colloidal dressing twice a week. Wrap with gauze then coban for compression.   Dr Philipe Laswell wants to see you back in 3 to 4 weeks.

## 2017-12-28 NOTE — Progress Notes (Signed)
   Subjective:    Patient ID: [name], female    DOB: [birth year], 84 y.o.   MRN: [id number]  HPI  Leg wound - Eval by Dr Vanetta Shawl 10/9 for ruptured blister on dorsum left foot.  Reported that pt hit foot on bathtub a few weeks prior.  An 10/2 Xray was negative for Fx but did show edema.  - HH nurse using foam dressing to wound - (+) drainage daily from wound - No fever, only mild pain in foot. - Pt does not believe it is getting smaller.  - The referral to [facility] has not occurred.    CKD-IV - Taking Lasix 40 mg about once a week for her ankle/foot edema.  Not wearing compression stockings.  - No NSAIDs  Afib - Call to pt's pharmacy has patient taking metoprolol tartrate 50 mg once a day.  Metoprolol succinate 25 mg BID was prescribed by at discharge after hospitalization cardiology on 8/19 - 8/24.    COPD  Prophylactic Antibiotic cyclic regiment per Dr Lamonte Sakai (Pulm) starting 12/13/17 rotating antibiotics during the first week of each month as follows: -Azithromycin 250 mg daily for 5 days -Doxycycline 100 mg twice a day for 7 days -Ceftin 250 mg twice a day for 7 days  IDA - Not taking iron supplement.   SH: Lives with dgt; good social support from 4 dgts.  [facility] providing [place] RN.  [facility] has ended.  Review of Systems See HPI    Objective:   Physical Exam VS reviewed GEN: Alert, Cooperative, Groomed, NAD, social HEENT: EAC bilaterally not occluded, COR: irr irr, No M LUNGS: BCTA, No Acc mm use, speaking in full sentences EXT: bilateral peripheral ankle & foot pitting edema. Unable palpate pulses but Cap Refill brisk.  SKIN: 2cm by 1 cm oval superficial wound with mix granulation tissue and slough.  Edema in surrounding tissue, surrounding skin o/w normal.  Gait: Non-atalgic gait, No significant path deviation, Step through +,  Psych: Normal affect/thought/speech/language    Assessment & Plan:  Visit Problem List with A/P  No problem-specific Assessment & Plan  notes found for this encounter.

## 2017-12-29 ENCOUNTER — Telehealth: Payer: Self-pay | Admitting: *Deleted

## 2017-12-29 DIAGNOSIS — S91302A Unspecified open wound, left foot, initial encounter: Secondary | ICD-10-CM | POA: Insufficient documentation

## 2017-12-29 DIAGNOSIS — S92309A Fracture of unspecified metatarsal bone(s), unspecified foot, initial encounter for closed fracture: Secondary | ICD-10-CM

## 2017-12-29 HISTORY — DX: Fracture of unspecified metatarsal bone(s), unspecified foot, initial encounter for closed fracture: S92.309A

## 2017-12-29 HISTORY — DX: Unspecified open wound, left foot, initial encounter: S91.302A

## 2017-12-29 LAB — CBC
Hematocrit: 35.4 % (ref 34.0–46.6)
Hemoglobin: 10.8 g/dL — ABNORMAL LOW (ref 11.1–15.9)
MCH: 28.8 pg (ref 26.6–33.0)
MCHC: 30.5 g/dL — ABNORMAL LOW (ref 31.5–35.7)
MCV: 94 fL (ref 79–97)
PLATELETS: 325 10*3/uL (ref 150–450)
RBC: 3.75 x10E6/uL — AB (ref 3.77–5.28)
RDW: 13 % (ref 12.3–15.4)
WBC: 5.9 10*3/uL (ref 3.4–10.8)

## 2017-12-29 LAB — BASIC METABOLIC PANEL
BUN / CREAT RATIO: 16 (ref 12–28)
BUN: 31 mg/dL (ref 10–36)
CO2: 25 mmol/L (ref 20–29)
CREATININE: 1.9 mg/dL — AB (ref 0.57–1.00)
Calcium: 8.9 mg/dL (ref 8.7–10.3)
Chloride: 101 mmol/L (ref 96–106)
GFR calc Af Amer: 26 mL/min/{1.73_m2} — ABNORMAL LOW (ref >59)
GFR, EST NON AFRICAN AMERICAN: 23 mL/min/{1.73_m2} — AB (ref >59)
Glucose: 83 mg/dL (ref 65–99)
Potassium: 5.1 mmol/L (ref 3.5–5.2)
Sodium: 141 mmol/L (ref 134–144)

## 2017-12-29 NOTE — Assessment & Plan Note (Signed)
Established problem Uncontrolled  Bilateral foot and ankle edema persists.  It contributes significantly to the patient's susceptibility to leg and foot wounds from slightest trauma.  She has had wounds in past around the malleous that have appeared to be venous insufficiency ulcers that responded to [employer] boot compression therapy.   Pt will not wear compression stockings.  She is unable to don them.  Patient's solitary aging kidney does not tolerate even moderate diuretic diuresis.   Will refer Mrs Valvo to Dr Early at [facility] for evaluation and recommendations for treatment of her presumed venous insufficiency.   Will consider consulting [facility]'s sho do lymphedema therapu

## 2017-12-29 NOTE — Assessment & Plan Note (Signed)
Lab Results  Component Value Date   HGBA1C 5.5 12/28/2017   Established problem. Stable. Continue Monitoring periodically

## 2017-12-29 NOTE — Assessment & Plan Note (Signed)
Lab Results  Component Value Date   CREATININE 1.90 (H) 12/28/2017   BUN 31 12/28/2017   NA 141 12/28/2017   K 5.1 12/28/2017   CL 101 12/28/2017   CO2 25 12/28/2017   Increase SCr slightly. Avoid increase in diuretic for ankle edema.

## 2017-12-29 NOTE — Telephone Encounter (Signed)
-----   Message from [place], MD sent at 12/29/2017  3:05 PM EDT ----- Regarding: Orders to [facility] RN Please call Olivia Mackie, RN with [facility], at [phone] and give left foot wound care recommendations  Every three days: Cleanse wound and dry, then Apply colloidal dressing to wound, then   Apply gauze wrap layer around wound, then  Apply Coban wrap for compression of foot and ankle.   Left foot elevation most hours of day.  Monitor for wound infection  Dr McDiarmid will reassess wound at his office in three weeks.

## 2017-12-29 NOTE — Assessment & Plan Note (Signed)
Monitoring Hgb CBC Latest Ref Rng & Units 12/28/2017 10/16/2017 10/16/2017  WBC 3.4 - 10.8 x10E3/uL 5.9 8.3 8.6  Hemoglobin 11.1 - 15.9 g/dL 10.8(L) 10.6(L) 11.0(L)  Hematocrit 34.0 - 46.6 % 35.4 35.1(L) 36.9  Platelets 150 - 450 x10E3/uL 325 198 209   Stable. Continue periodic monitoring.

## 2017-12-29 NOTE — Telephone Encounter (Signed)
LM for tracy to call back and receive orders.  Jazmin Hartsell,CMA

## 2017-12-29 NOTE — Assessment & Plan Note (Addendum)
Wound present for ~ 4-5 weeks with uncertain healing with just foam dressing. Need to autodebride some slough from wound base.  Need to reduce edema involving wound and periwound tissue.   Applied colloidal dressing.   Applied gauze wrap layer to absorb moisture/perspiration Applied Coban wrap for compression.  Wound elevation most hours of day.  Will send wound care request both via pt's AVS but will try to get orders to [facility] home [facility] Nurse.  Sent additional collidal dressing and coban material with patient.    RTC 3 weeks for wound reassessment.

## 2018-01-01 NOTE — Telephone Encounter (Signed)
Spoke with Olivia Mackie and gave her verbal orders for wound care.  Braeson Rupe,CMA

## 2018-01-02 ENCOUNTER — Other Ambulatory Visit: Payer: Self-pay | Admitting: Family Medicine

## 2018-01-02 DIAGNOSIS — S90522D Blister (nonthermal), left ankle, subsequent encounter: Secondary | ICD-10-CM | POA: Diagnosis not present

## 2018-01-02 DIAGNOSIS — I5032 Chronic diastolic (congestive) heart failure: Secondary | ICD-10-CM | POA: Diagnosis not present

## 2018-01-02 DIAGNOSIS — I482 Chronic atrial fibrillation: Secondary | ICD-10-CM | POA: Diagnosis not present

## 2018-01-02 DIAGNOSIS — J441 Chronic obstructive pulmonary disease with (acute) exacerbation: Secondary | ICD-10-CM | POA: Diagnosis not present

## 2018-01-02 DIAGNOSIS — I2510 Atherosclerotic heart disease of native coronary artery without angina pectoris: Secondary | ICD-10-CM | POA: Diagnosis not present

## 2018-01-02 DIAGNOSIS — I130 Hypertensive heart and chronic kidney disease with heart failure and stage 1 through stage 4 chronic kidney disease, or unspecified chronic kidney disease: Secondary | ICD-10-CM | POA: Diagnosis not present

## 2018-01-02 DIAGNOSIS — J449 Chronic obstructive pulmonary disease, unspecified: Secondary | ICD-10-CM

## 2018-01-02 MED ORDER — ALBUTEROL SULFATE (2.5 MG/3ML) 0.083% IN NEBU: 2.5000 mg | INHALATION_SOLUTION | Freq: Four times a day (QID) | RESPIRATORY_TRACT | 11 refills | Status: DC | PRN

## 2018-01-05 DIAGNOSIS — I2510 Atherosclerotic heart disease of native coronary artery without angina pectoris: Secondary | ICD-10-CM | POA: Diagnosis not present

## 2018-01-05 DIAGNOSIS — I482 Chronic atrial fibrillation: Secondary | ICD-10-CM | POA: Diagnosis not present

## 2018-01-05 DIAGNOSIS — I130 Hypertensive heart and chronic kidney disease with heart failure and stage 1 through stage 4 chronic kidney disease, or unspecified chronic kidney disease: Secondary | ICD-10-CM | POA: Diagnosis not present

## 2018-01-05 DIAGNOSIS — J441 Chronic obstructive pulmonary disease with (acute) exacerbation: Secondary | ICD-10-CM | POA: Diagnosis not present

## 2018-01-05 DIAGNOSIS — S90522D Blister (nonthermal), left ankle, subsequent encounter: Secondary | ICD-10-CM | POA: Diagnosis not present

## 2018-01-05 DIAGNOSIS — I5032 Chronic diastolic (congestive) heart failure: Secondary | ICD-10-CM | POA: Diagnosis not present

## 2018-01-10 ENCOUNTER — Other Ambulatory Visit: Payer: Self-pay

## 2018-01-10 ENCOUNTER — Emergency Department (HOSPITAL_BASED_OUTPATIENT_CLINIC_OR_DEPARTMENT_OTHER): Payer: Medicare Other

## 2018-01-10 ENCOUNTER — Inpatient Hospital Stay (HOSPITAL_COMMUNITY)
Admission: EM | Admit: 2018-01-10 | Discharge: 2018-01-15 | DRG: 603 | Disposition: A | Discharge status: Skilled Nursing Facility | Payer: Medicare Other | Attending: Family Medicine | Admitting: Family Medicine

## 2018-01-10 ENCOUNTER — Emergency Department (HOSPITAL_COMMUNITY): Payer: Medicare Other

## 2018-01-10 DIAGNOSIS — N184 Chronic kidney disease, stage 4 (severe): Secondary | ICD-10-CM | POA: Diagnosis not present

## 2018-01-10 DIAGNOSIS — Z955 Presence of coronary angioplasty implant and graft: Secondary | ICD-10-CM

## 2018-01-10 DIAGNOSIS — Z7951 Long term (current) use of inhaled steroids: Secondary | ICD-10-CM

## 2018-01-10 DIAGNOSIS — S92355D Nondisplaced fracture of fifth metatarsal bone, left foot, subsequent encounter for fracture with routine healing: Secondary | ICD-10-CM | POA: Diagnosis not present

## 2018-01-10 DIAGNOSIS — I4821 Permanent atrial fibrillation: Secondary | ICD-10-CM | POA: Diagnosis not present

## 2018-01-10 DIAGNOSIS — N3941 Urge incontinence: Secondary | ICD-10-CM | POA: Diagnosis present

## 2018-01-10 DIAGNOSIS — L0390 Cellulitis, unspecified: Secondary | ICD-10-CM | POA: Diagnosis present

## 2018-01-10 DIAGNOSIS — I4819 Other persistent atrial fibrillation: Secondary | ICD-10-CM | POA: Diagnosis not present

## 2018-01-10 DIAGNOSIS — R609 Edema, unspecified: Secondary | ICD-10-CM

## 2018-01-10 DIAGNOSIS — I5032 Chronic diastolic (congestive) heart failure: Secondary | ICD-10-CM | POA: Diagnosis not present

## 2018-01-10 DIAGNOSIS — I130 Hypertensive heart and chronic kidney disease with heart failure and stage 1 through stage 4 chronic kidney disease, or unspecified chronic kidney disease: Secondary | ICD-10-CM | POA: Diagnosis present

## 2018-01-10 DIAGNOSIS — R41841 Cognitive communication deficit: Secondary | ICD-10-CM | POA: Diagnosis not present

## 2018-01-10 DIAGNOSIS — Z905 Acquired absence of kidney: Secondary | ICD-10-CM

## 2018-01-10 DIAGNOSIS — J449 Chronic obstructive pulmonary disease, unspecified: Secondary | ICD-10-CM | POA: Diagnosis not present

## 2018-01-10 DIAGNOSIS — N179 Acute kidney failure, unspecified: Secondary | ICD-10-CM | POA: Diagnosis not present

## 2018-01-10 DIAGNOSIS — Z87891 Personal history of nicotine dependence: Secondary | ICD-10-CM | POA: Diagnosis not present

## 2018-01-10 DIAGNOSIS — M2550 Pain in unspecified joint: Secondary | ICD-10-CM | POA: Diagnosis not present

## 2018-01-10 DIAGNOSIS — M7989 Other specified soft tissue disorders: Secondary | ICD-10-CM

## 2018-01-10 DIAGNOSIS — S92355A Nondisplaced fracture of fifth metatarsal bone, left foot, initial encounter for closed fracture: Secondary | ICD-10-CM | POA: Diagnosis present

## 2018-01-10 DIAGNOSIS — J309 Allergic rhinitis, unspecified: Secondary | ICD-10-CM | POA: Diagnosis not present

## 2018-01-10 DIAGNOSIS — Z7901 Long term (current) use of anticoagulants: Secondary | ICD-10-CM

## 2018-01-10 DIAGNOSIS — Z23 Encounter for immunization: Secondary | ICD-10-CM

## 2018-01-10 DIAGNOSIS — I2510 Atherosclerotic heart disease of native coronary artery without angina pectoris: Secondary | ICD-10-CM | POA: Diagnosis present

## 2018-01-10 DIAGNOSIS — R413 Other amnesia: Secondary | ICD-10-CM | POA: Diagnosis present

## 2018-01-10 DIAGNOSIS — W228XXA Striking against or struck by other objects, initial encounter: Secondary | ICD-10-CM | POA: Diagnosis present

## 2018-01-10 DIAGNOSIS — Z888 Allergy status to other drugs, medicaments and biological substances status: Secondary | ICD-10-CM

## 2018-01-10 DIAGNOSIS — I4891 Unspecified atrial fibrillation: Secondary | ICD-10-CM

## 2018-01-10 DIAGNOSIS — E785 Hyperlipidemia, unspecified: Secondary | ICD-10-CM | POA: Diagnosis not present

## 2018-01-10 DIAGNOSIS — L03116 Cellulitis of left lower limb: Secondary | ICD-10-CM | POA: Diagnosis not present

## 2018-01-10 DIAGNOSIS — I509 Heart failure, unspecified: Secondary | ICD-10-CM | POA: Diagnosis not present

## 2018-01-10 DIAGNOSIS — I129 Hypertensive chronic kidney disease with stage 1 through stage 4 chronic kidney disease, or unspecified chronic kidney disease: Secondary | ICD-10-CM | POA: Diagnosis not present

## 2018-01-10 DIAGNOSIS — Z85528 Personal history of other malignant neoplasm of kidney: Secondary | ICD-10-CM

## 2018-01-10 DIAGNOSIS — Z950 Presence of cardiac pacemaker: Secondary | ICD-10-CM

## 2018-01-10 DIAGNOSIS — Z79899 Other long term (current) drug therapy: Secondary | ICD-10-CM

## 2018-01-10 DIAGNOSIS — K219 Gastro-esophageal reflux disease without esophagitis: Secondary | ICD-10-CM | POA: Diagnosis present

## 2018-01-10 DIAGNOSIS — T501X5A Adverse effect of loop [high-ceiling] diuretics, initial encounter: Secondary | ICD-10-CM | POA: Diagnosis present

## 2018-01-10 DIAGNOSIS — Z96653 Presence of artificial knee joint, bilateral: Secondary | ICD-10-CM | POA: Diagnosis present

## 2018-01-10 DIAGNOSIS — N183 Chronic kidney disease, stage 3 unspecified: Secondary | ICD-10-CM

## 2018-01-10 DIAGNOSIS — R2689 Other abnormalities of gait and mobility: Secondary | ICD-10-CM | POA: Diagnosis not present

## 2018-01-10 DIAGNOSIS — M48061 Spinal stenosis, lumbar region without neurogenic claudication: Secondary | ICD-10-CM | POA: Diagnosis present

## 2018-01-10 DIAGNOSIS — M79609 Pain in unspecified limb: Secondary | ICD-10-CM | POA: Diagnosis not present

## 2018-01-10 DIAGNOSIS — R262 Difficulty in walking, not elsewhere classified: Secondary | ICD-10-CM | POA: Diagnosis present

## 2018-01-10 DIAGNOSIS — E7800 Pure hypercholesterolemia, unspecified: Secondary | ICD-10-CM | POA: Diagnosis present

## 2018-01-10 DIAGNOSIS — R52 Pain, unspecified: Secondary | ICD-10-CM | POA: Diagnosis not present

## 2018-01-10 DIAGNOSIS — L97529 Non-pressure chronic ulcer of other part of left foot with unspecified severity: Secondary | ICD-10-CM | POA: Diagnosis present

## 2018-01-10 DIAGNOSIS — L03119 Cellulitis of unspecified part of limb: Secondary | ICD-10-CM | POA: Diagnosis not present

## 2018-01-10 DIAGNOSIS — R600 Localized edema: Secondary | ICD-10-CM | POA: Diagnosis not present

## 2018-01-10 DIAGNOSIS — N189 Chronic kidney disease, unspecified: Secondary | ICD-10-CM | POA: Diagnosis not present

## 2018-01-10 DIAGNOSIS — Z7401 Bed confinement status: Secondary | ICD-10-CM | POA: Diagnosis not present

## 2018-01-10 DIAGNOSIS — I252 Old myocardial infarction: Secondary | ICD-10-CM | POA: Diagnosis not present

## 2018-01-10 DIAGNOSIS — M6281 Muscle weakness (generalized): Secondary | ICD-10-CM | POA: Diagnosis not present

## 2018-01-10 DIAGNOSIS — Z885 Allergy status to narcotic agent status: Secondary | ICD-10-CM

## 2018-01-10 LAB — CBC WITH DIFFERENTIAL/PLATELET
ABS IMMATURE GRANULOCYTES: 0.03 10*3/uL (ref 0.00–0.07)
BASOS ABS: 0 10*3/uL (ref 0.0–0.1)
Basophils Relative: 0 %
Eosinophils Absolute: 0.1 10*3/uL (ref 0.0–0.5)
Eosinophils Relative: 1 %
HCT: 39.3 % (ref 36.0–46.0)
HEMOGLOBIN: 11.9 g/dL — AB (ref 12.0–15.0)
Immature Granulocytes: 0 %
LYMPHS PCT: 14 %
Lymphs Abs: 1.1 10*3/uL (ref 0.7–4.0)
MCH: 28.9 pg (ref 26.0–34.0)
MCHC: 30.3 g/dL (ref 30.0–36.0)
MCV: 95.4 fL (ref 80.0–100.0)
Monocytes Absolute: 1.3 10*3/uL — ABNORMAL HIGH (ref 0.1–1.0)
Monocytes Relative: 16 %
NEUTROS ABS: 5.5 10*3/uL (ref 1.7–7.7)
NRBC: 0 % (ref 0.0–0.2)
Neutrophils Relative %: 69 %
Platelets: 197 10*3/uL (ref 150–400)
RBC: 4.12 MIL/uL (ref 3.87–5.11)
RDW: 13.3 % (ref 11.5–15.5)
WBC: 7.9 10*3/uL (ref 4.0–10.5)

## 2018-01-10 LAB — COMPREHENSIVE METABOLIC PANEL
ALBUMIN: 3.6 g/dL (ref 3.5–5.0)
ALT: 24 U/L (ref 0–44)
AST: 30 U/L (ref 15–41)
Alkaline Phosphatase: 60 U/L (ref 38–126)
Anion gap: 13 (ref 5–15)
BUN: 35 mg/dL — ABNORMAL HIGH (ref 8–23)
CHLORIDE: 98 mmol/L (ref 98–111)
CO2: 26 mmol/L (ref 22–32)
Calcium: 9 mg/dL (ref 8.9–10.3)
Creatinine, Ser: 1.76 mg/dL — ABNORMAL HIGH (ref 0.44–1.00)
GFR, EST AFRICAN AMERICAN: 28 mL/min — AB (ref >60)
GFR, EST NON AFRICAN AMERICAN: 24 mL/min — AB (ref >60)
Glucose, Bld: 108 mg/dL — ABNORMAL HIGH (ref 70–99)
POTASSIUM: 3.8 mmol/L (ref 3.5–5.1)
SODIUM: 137 mmol/L (ref 135–145)
Total Bilirubin: 1.2 mg/dL (ref 0.3–1.2)
Total Protein: 6.6 g/dL (ref 6.5–8.1)

## 2018-01-10 LAB — I-STAT CG4 LACTIC ACID, ED: LACTIC ACID, VENOUS: 1.29 mmol/L (ref 0.5–1.9)

## 2018-01-10 MED ORDER — FLUTICASONE PROPIONATE 50 MCG/ACT NA SUSP
2.0000 | Freq: Every day | NASAL | Status: DC
  Administered 2018-01-11 – 2018-01-15 (×5): 2 via NASAL
  Filled 2018-01-10: qty 16

## 2018-01-10 MED ORDER — PANTOPRAZOLE SODIUM 40 MG PO TBEC
40.0000 mg | DELAYED_RELEASE_TABLET | Freq: Every day | ORAL | Status: DC
  Administered 2018-01-11 – 2018-01-15 (×5): 40 mg via ORAL
  Filled 2018-01-10 (×5): qty 1

## 2018-01-10 MED ORDER — ACETAMINOPHEN 325 MG PO TABS
650.0000 mg | ORAL_TABLET | Freq: Four times a day (QID) | ORAL | Status: DC | PRN
  Administered 2018-01-11 – 2018-01-13 (×5): 650 mg via ORAL
  Filled 2018-01-10 (×5): qty 2

## 2018-01-10 MED ORDER — CEFAZOLIN SODIUM-DEXTROSE 2-4 GM/100ML-% IV SOLN
2.0000 g | Freq: Two times a day (BID) | INTRAVENOUS | Status: DC
  Administered 2018-01-11 – 2018-01-12 (×3): 2 g via INTRAVENOUS
  Filled 2018-01-10 (×3): qty 100

## 2018-01-10 MED ORDER — SODIUM CHLORIDE 0.9 % IV BOLUS: 1000.0000 mL | Freq: Once | INTRAVENOUS | Status: DC

## 2018-01-10 MED ORDER — ALBUTEROL SULFATE (2.5 MG/3ML) 0.083% IN NEBU
2.5000 mg | INHALATION_SOLUTION | Freq: Four times a day (QID) | RESPIRATORY_TRACT | Status: DC | PRN
  Administered 2018-01-11: 2.5 mg via RESPIRATORY_TRACT
  Filled 2018-01-10: qty 3

## 2018-01-10 MED ORDER — ACETAMINOPHEN 650 MG RE SUPP: 650.0000 mg | Freq: Four times a day (QID) | RECTAL | Status: DC | PRN

## 2018-01-10 MED ORDER — TRAMADOL HCL 50 MG PO TABS
50.0000 mg | ORAL_TABLET | Freq: Four times a day (QID) | ORAL | Status: DC | PRN
  Administered 2018-01-12: 50 mg via ORAL
  Filled 2018-01-10: qty 1

## 2018-01-10 MED ORDER — NITROGLYCERIN 0.4 MG SL SUBL: 0.4000 mg | SUBLINGUAL_TABLET | SUBLINGUAL | Status: DC | PRN

## 2018-01-10 MED ORDER — CEFAZOLIN SODIUM-DEXTROSE 2-4 GM/100ML-% IV SOLN
2.0000 g | Freq: Once | INTRAVENOUS | Status: AC
  Administered 2018-01-10: 2 g via INTRAVENOUS
  Filled 2018-01-10: qty 100

## 2018-01-10 MED ORDER — FUROSEMIDE 20 MG PO TABS
20.0000 mg | ORAL_TABLET | Freq: Every day | ORAL | Status: DC
  Administered 2018-01-11 – 2018-01-13 (×3): 20 mg via ORAL
  Filled 2018-01-10 (×3): qty 1

## 2018-01-10 MED ORDER — SODIUM CHLORIDE 0.9 % IV BOLUS
500.0000 mL | Freq: Once | INTRAVENOUS | Status: AC
  Administered 2018-01-10: 500 mL via INTRAVENOUS

## 2018-01-10 MED ORDER — OXYCODONE-ACETAMINOPHEN 5-325 MG PO TABS
0.5000 | ORAL_TABLET | Freq: Once | ORAL | Status: AC
  Administered 2018-01-10: 0.5 via ORAL
  Filled 2018-01-10: qty 1

## 2018-01-10 MED ORDER — METOPROLOL SUCCINATE ER 25 MG PO TB24
25.0000 mg | ORAL_TABLET | Freq: Every day | ORAL | Status: DC
  Administered 2018-01-11 – 2018-01-15 (×5): 25 mg via ORAL
  Filled 2018-01-10 (×5): qty 1

## 2018-01-10 MED ORDER — APIXABAN 2.5 MG PO TABS
2.5000 mg | ORAL_TABLET | Freq: Two times a day (BID) | ORAL | Status: DC
  Administered 2018-01-11 – 2018-01-15 (×10): 2.5 mg via ORAL
  Filled 2018-01-10 (×10): qty 1

## 2018-01-10 MED ORDER — OXYCODONE-ACETAMINOPHEN 5-325 MG PO TABS
0.5000 | ORAL_TABLET | Freq: Once | ORAL | Status: DC
  Filled 2018-01-10: qty 1

## 2018-01-10 MED ORDER — AMIODARONE HCL 100 MG PO TABS
200.0000 mg | ORAL_TABLET | ORAL | Status: DC
  Administered 2018-01-11 – 2018-01-15 (×3): 200 mg via ORAL
  Filled 2018-01-10 (×3): qty 2

## 2018-01-10 MED ORDER — IPRATROPIUM-ALBUTEROL 0.5-2.5 (3) MG/3ML IN SOLN
3.0000 mL | Freq: Four times a day (QID) | RESPIRATORY_TRACT | Status: DC | PRN
  Filled 2018-01-10: qty 3

## 2018-01-10 MED ORDER — ATORVASTATIN CALCIUM 10 MG PO TABS
20.0000 mg | ORAL_TABLET | Freq: Every day | ORAL | Status: DC
  Administered 2018-01-11 – 2018-01-15 (×5): 20 mg via ORAL
  Filled 2018-01-10 (×5): qty 1

## 2018-01-10 MED ORDER — TETANUS-DIPHTH-ACELL PERTUSSIS 5-2.5-18.5 LF-MCG/0.5 IM SUSP
0.5000 mL | Freq: Once | INTRAMUSCULAR | Status: AC
  Administered 2018-01-10: 0.5 mL via INTRAMUSCULAR
  Filled 2018-01-10: qty 0.5

## 2018-01-10 MED ORDER — POLYETHYLENE GLYCOL 3350 17 G PO PACK: 17.0000 g | PACK | Freq: Every day | ORAL | Status: DC | PRN

## 2018-01-10 MED ORDER — BUDESONIDE 0.5 MG/2ML IN SUSP
0.5000 mg | Freq: Two times a day (BID) | RESPIRATORY_TRACT | Status: DC
  Administered 2018-01-11 – 2018-01-15 (×9): 0.5 mg via RESPIRATORY_TRACT
  Filled 2018-01-10 (×10): qty 2

## 2018-01-10 NOTE — ED Provider Notes (Signed)
Medical screening examination/treatment/procedure(s) were conducted as a shared visit with non-physician practitioner(s) and myself.  I personally evaluated the patient during the encounter. Briefly, the patient is a 84 y.o. female with history of COPD, CKD who presents to the ED with left foot and ankle swelling.  Patient with unremarkable vitals.  No fever.  Patient with redness, swelling, pain for the last 2 days.  Has an abrasion over the left part of her foot for the past month and now with pain and swelling.  Good pulses throughout.  No calf tenderness.  No recent surgery, clot history.  Patient denies any trauma.  Patient with warmth and erythema of her left foot and ankle.  Concerning for cellulitis.  No pain with range of motion of her ankle and no concern for septic arthritis.  Patient is overall well-appearing.  Lab work has been initiated for infectious work-up.  DVT study ultrasound ordered as well as well as x-rays of the left lower extremity.  Patient signed out to oncoming ED staff but suspect patient likely with cellulitis and will likely be amenable to p.o. antibiotics and close follow-up with primary care doctor.  Please see ED provider's note for further results, evaluation, disposition of patient.  This chart was dictated using voice recognition software.  Despite best efforts to proofread,  errors can occur which can change the documentation meaning.   EKG Interpretation None           Lennice Sites, DO 01/10/18 1553

## 2018-01-10 NOTE — ED Notes (Signed)
Pt taken to xray 

## 2018-01-10 NOTE — ED Provider Notes (Addendum)
[place] EMERGENCY DEPARTMENT Provider Note   CSN: [id number] Arrival date & time: 01/10/18  1406     History   Chief Complaint Chief Complaint  Patient presents with  . Foot Pain    HPI [name] is a 84 y.o. female.  HPI   Patient is a 84 year old female with a history of pacemaker in place, CAD, COPD, presenting for left foot and leg pain.  She reports that her symptoms began 2 days ago, she has had progressive inability to walk on her foot.  She feels that it is "broken".  Patient cannot remember a fall or injury that precipitated the pain.  Patient reports that she has not fallen recently.  Patient reports that the erythema has been spitting up her leg for the past 2 days.  She reports that she had a ulceration over the dorsum of her left foot for several weeks and it is followed by her primary care provider.  Patient denies any fevers, chills, night sweats, or diaphoresis.  She denies any chest pain, shortness of breath, abdominal pain, nausea, or vomiting.  Patient denies taking any pain medication at home.  Unknown last tetanus shot.  Patient lives with her daughter who assists in activities of daily living as needed.  Past Medical History:  Diagnosis Date  . Abnormal mammogram, unspecified 08/23/2010   Followup imaging reassuring.   . Acute appendicitis with rupture   . ADENOMATOUS COLONIC POLYP 03/01/2003   Qualifier: Diagnosis of  By: McDiarmid MD, Sherren Mocha Multiple benign polyps of cecum, ascending, transverse and sigmoid colon by 8/09 colonoscopy by Dr Cristina Gong  Colonoscopy by Dr Cristina Gong for iron-deficiency anemia on 04/27/2010 showed three sessile polyps that were in ascending (3 mm x 9 mm), transverse (4 mm), and cecum (3 mm).  All three were tubular adenomas that were negative for high grade dysplasia or malignancy on pathology. Dr Cristina Gong called the polpys benign and not requiring follow-up in view of the patients age.    . Adrenal adenoma    Incidentaloma  . AF (paroxysmal atrial fibrillation) ([place]) 11/11/2010   Hospitalization (9/8-9/10, Dr Daneen Schick, III, Cardiology) for Paroxysmal Atrial Fibrillation with RVR and anginal pain secondary to demand/supply mismatch in setting of RVR with known circumflex artery branch disease.    Marland Kitchen ANXIETY 04/27/2006   Qualifier: Diagnosis of  By: McDiarmid MD, Sherren Mocha    . Benign essential tremor 02/04/2016  . Candidiasis of the esophagus 11/26/2007  . CAP (community acquired pneumonia) 02/25/2015  . Cataract 2013   Bilateral   . Cellulitis of leg, right 10/01/2012  . Cellulitis of right lower extremity   . Chest pain with moderate risk of acute coronary syndrome 05/21/2015  . Chronic kidney disease (CKD), stage III (moderate) (HCC)   . Concussion with loss of consciousness   . COPD 04/27/2006   Qualifier: Diagnosis of  By: McDiarmid MD, Sherren Mocha    . COPD exacerbation ([place])   . COPD, severe   . Decreased functional mobility and endurance 03/17/2015  . Diastolic heart failure ([place]) 07/30/2015  . DISC WITH RADICULOPATHY 04/27/2006   Qualifier: Diagnosis of  By: McDiarmid MD, Sherren Mocha    . Dyspnea   . EDEMA-LEGS,DUE TO VENOUS OBSTRUCT. 04/27/2006   Qualifier: Diagnosis of  By: McDiarmid MD, Sherren Mocha    . 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 Normal uric acid serum level at time of attack.    Marland Kitchen  HERNIA, HIATAL, NONCONGENITAL 04/27/2006   Qualifier: Diagnosis of  By: McDiarmid MD, Sherren Mocha    . High risk medications (not anticoagulants) long-term use 03/05/2012  . History of Hemorrhoids 04/27/2006   Qualifier: Diagnosis of  By: McDiarmid MD, Sherren Mocha    . History of iron deficiency 01/16/2015  . Hx of colonoscopy with polypectomy 04/27/2010   Dr Cristina Gong found three  tubular adenomas each less than 10 mm size  . Hypertension   . Hypotension 07/09/2015  . Iron deficiency anemia 08/05/2010   Dr Cristina Gong (GI) has evaluated with EGD, colonoscopy, and  video capsular endoscopy in 2011 & 2012.  All have been unrevealing as to an origin of IDA.  OV with Dr Cristina Gong (10/28/10) assessment of blood in stool per hemoccult and GER. Hbg 12.1 g/dL, MCV 91.8, Ferritin 30 ng/mL. Patient taking on ferrous sulfate tab daily.   EGD on 03/06/12 by Dr Cristina Gong for IDA non-obstructing Schatzki's ring at Gastroesophageal junction, otherwise normal esophagus and stomach.     . Leg cramps 05/29/2012  . Lumbar herniated disc    History of HNP L4/5 in 2003  . Macular degeneration, bilateral 10/04/2010   Right eye is wet MD, the other is dry macular degeneration (ARMD). Pt undergoing some form of vascular endothelial growth factor inhibition intraocular therapy.    . Mild cognitive impairment 10/26/2012   (10/25/12) Failed MiniCog screen  . MUSCLE CRAMPS 03/11/2010   Qualifier: Diagnosis of  By: McDiarmid MD, Sherren Mocha    . Muscle spasm of back 09/24/2013  . Myocardial infarct, old   . Nocturia 10/28/2016  . Numbness and tingling in hands 07/21/2011  . Pacemaker    MDT  . PREDIABETES 09/11/2007   Qualifier: Diagnosis of  By: McDiarmid MD, Sherren Mocha    . Retinal hemorrhage of left eye 06/2010  . RHINITIS, ALLERGIC 04/27/2006   Qualifier: Diagnosis of  By: McDiarmid MD, Sherren Mocha    . SCHATZKI'S RING, HX OF 11/26/2007   Qualifier: Diagnosis of  By: McDiarmid MD, Sherren Mocha  An EGD was performed by Dr Cristina Gong on 04/27/2010 for iron deficiency anemia. There was a a transient hiatal hernia with Schatzki's ring. Stomach and duodenum were normal. EGD on 03/06/12 by Dr Cristina Gong for IDA non-obstructing Schatzki's ring at Gastroesophageal junction, otherwise normal esophagus and stomach.    . Seborrheic keratosis, right anterior thigh 12/12/2013  . Shoulder pain, left 01/09/2014  . Sick sinus syndrome with tachycardia ([place])    MDT  . Soft tissue injury of foot 05/03/2011  . Solar lentigo 06/15/2012  . Solitary kidney, acquired 05/17/2010   Surgical removal for transitional cell cancer by Tresa Endo, MD ([place]).  Surveillance cystoscopy by Dr Alinda Money [facility]) on 10/19/12 without evidence of cystoscopic recurrence. Recommend RTC one year for cystoscopy.   Marland Kitchen Spinal stenosis, lumbar   . Transitional cell carcinoma of ureter, history   . Urge incontinence 12/13/2011   Diagnosed in 10/2011 by Dr Bjorn Loser (Urology)   . VENTRICULAR HYPERTROPHY, LEFT 08/28/2008   Qualifier: Diagnosis of  By: McDiarmid MD, Sherren Mocha    . VITAMIN B12 DEFICIENCY 10/07/2009   Qualifier: Diagnosis of  By: McDiarmid MD, Sherren Mocha  Dx based on a post-TKR anemia work-up Low normal serum B12 with high Methylmalonic acid and homocysteine level  Vit B12 serum level (10/28/10) > 1500 pg/mL   . Vitamin D deficiency 11/02/2010   Serum vitamin D 25(OH) = 10.9 ng/mL (30 -100) on 10/28/10 c/w Vitamin D deficiency.       Patient Active Problem  List   Diagnosis Date Noted  . Wound of left foot 12/29/2017    Class: Acute  . Traumatic ecchymosis of left foot 12/07/2017  . Fracture of 5th metatarsal, Right, avulsion fx of tip styloid 11/09/2017  . Laceration of arm, unspecified laterality, initial encounter 11/03/2017  . Acute on chronic diastolic heart failure ([place]) 10/16/2017  . Venous insufficiency of both lower extremities 09/21/2017  . Impaired mobility and ADLs 09/01/2017  . Other fatigue 06/30/2017  . Bilateral leg edema 01/10/2017  . Fall 10/27/2016  . Chronic diastolic CHF (congestive heart failure) ([place]) 11/08/2015  . GERD (gastroesophageal reflux disease) 11/08/2015  . On amiodarone therapy 09/14/2015  . Paroxysmal atrial fibrillation (HCC)   . History of iron deficiency 01/16/2015  . Venous stasis ulcer of ankle, left ([place]) 10/24/2014  . Pure hypercholesterolemia 07/31/2014  . CAD S/P RCA BMS, residual CFX disease 06/08/2014  . Persistent atrial fibrillation ([place]) 05/31/2014  . Pacemaker 04/02/2013  . Cognitive impairment 10/26/2012  . Chronic anticoagulation 10/01/2012  . Urge incontinence 12/13/2011  . Vitamin D  deficiency 11/02/2010  . Macular degeneration, bilateral 10/04/2010    Class: Chronic  . Iron deficiency anemia due to chronic blood loss 08/05/2010  . Solitary kidney, acquired 05/17/2010  . Spondylolisthesis of lumbar region   . VITAMIN B12 DEFICIENCY 10/07/2009  . Chronic kidney disease (CKD), stage IV (severe) ([place]) 09/02/2009  . MYOCARDIAL INFARCTION, HX OF 09/25/2008  . VENTRICULAR HYPERTROPHY, LEFT 08/28/2008  . At risk for diabetes mellitus 09/11/2007  . SICK SINUS SYNDROME 04/27/2006  . Allergic rhinitis, seasonal 04/27/2006  . COPD, severe ([place]) 04/27/2006  . GASTROESOPHAGEAL REFLUX, NO ESOPHAGITIS 04/27/2006  . Osteoarthrosis involving lower leg 04/27/2006  . LUMBAR SPINAL STENOSIS 04/27/2006    Past Surgical History:  Procedure Laterality Date  . BREAST SURGERY     breast reduction  . CARDIOVERSION N/A 10/20/2017   Procedure: CARDIOVERSION;  Surgeon: Josue Hector, MD;  Location: [facility] ENDOSCOPY;  Service: Cardiovascular;  Laterality: N/A;  . CHOLECYSTECTOMY    . CORONARY ANGIOPLASTY WITH STENT PLACEMENT  2010   BMS RCA, OM2 occluded  . CYSTOSCOPY  09/30/2015   No cystoscopic evidence of uroepithelial neoplasm.  Follow up surveillance cystoscopy in one year  . CYSTOSCOPY  10/13/2017   Dr Raynelle Bring [facility])  . DUPUYTREN CONTRACTURE RELEASE Right 05/22/2014   Procedure: DUPUYTREN RELEASE AND REPAIR AS NECESSARY RIGHT RING FINGER AND MIDDLE FINGER;  Surgeon: Roseanne Kaufman, MD;  Location: [facility];  Service: Orthopedics;  Laterality: Right;  . ESOPHAGOGASTRODUODENOSCOPY  04/27/2010   Dr Cristina Gong - found transient H/H & Schatzki's ring  . ESOPHAGOGASTRODUODENOSCOPY ENDOSCOPY  03/06/2012   Dr Cristina Gong - found non-obstuctive Schatzki's ring at [employer] o/w normal EGD.   Marland Kitchen EYE SURGERY     bilateral cataracts  . KNEE ARTHROSCOPY W/ SYNOVECTOMY  11/2009, left knee   Dr Wynelle Link  . NEPHRECTOMY  For transition cell cancer    Dr Tresa Endo, surgeon  . PACEMAKER  GENERATOR CHANGE N/A 11/12/2012   Procedure: PACEMAKER GENERATOR CHANGE;  Surgeon: Evans Lance, MD; [employer] [facility] dual-chamber pacemaker serial number [id number]; Laterality: Right  . PACEMAKER INSERTION  2005   Dr Doreatha Lew  . PACEMAKER LEAD REMOVAL  2005   Removal and reinsertion of atrial and ventricular leads due to migration  . REPLACEMENT TOTAL KNEE  2009, Right knee   Dr Wynelle Link  . REPLACEMENT TOTAL KNEE  05/2009, Left knee   Dr Wynelle Link  . TONSILLECTOMY    .  TRIGGER FINGER RELEASE Right 05/22/2014   Procedure: RIGHT HAND A-1 PULLEY RELEASE ;  Surgeon: Roseanne Kaufman, MD;  Location: [place];  Service: Orthopedics;  Laterality: Right;     OB History   None      Home Medications    Prior to Admission medications   Medication Sig Start Date End Date Taking? Authorizing Provider  albuterol (PROVENTIL) (2.5 MG/3ML) 0.083% nebulizer solution Take 3 mLs (2.5 mg total) by nebulization every 6 (six) hours as needed for wheezing or shortness of breath. 01/02/18 12/28/18  McDiarmid, Blane Ohara, MD  amiodarone (PACERONE) 200 MG tablet Take one tablet by mouth daily Monday through Friday.  Do not take on Saturday or Sunday. 10/23/17   Evans Lance, MD  apixaban (ELIQUIS) 2.5 MG TABS tablet Take 1 tablet (2.5 mg total) by mouth 2 (two) times daily. 10/21/17   Lyda Jester M, PA-C  atorvastatin (LIPITOR) 20 MG tablet TAKE 1 TABLET BY MOUTH DAILY 12/19/16   McDiarmid, Blane Ohara, MD  azithromycin (ZITHROMAX) 250 MG tablet Take one tablet daily for 5 days Patient not taking: Reported on 12/25/2017 12/13/17   Collene Gobble, MD  budesonide (PULMICORT) 0.5 MG/2ML nebulizer solution Take 2 mLs (0.5 mg total) by nebulization 2 (two) times daily. 10/09/17   McDiarmid, Blane Ohara, MD  cefUROXime (CEFTIN) 250 MG tablet Take 1 tablet (250 mg total) by mouth 2 (two) times daily with a meal. Patient not taking: Reported on 12/25/2017 12/13/17   Collene Gobble, MD  doxycycline (VIBRA-TABS) 100 MG tablet Take 1  tablet (100 mg total) by mouth 2 (two) times daily. Patient not taking: Reported on 12/25/2017 12/13/17   Collene Gobble, MD  fluticasone [facility]) 50 MCG/ACT nasal spray SHAKE LIQUID AND USE 2 SPRAYS IN EACH NOSTRIL DAILY 01/31/17   Collene Gobble, MD  furosemide (LASIX) 20 MG tablet Take 1 tablet (20 mg total) by mouth daily. 10/21/17   Lyda Jester M, PA-C  ipratropium-albuterol (DUONEB) 0.5-2.5 (3) MG/3ML SOLN USE 3 ML VIA NEBULIZER EVERY 4 HOURS 08/25/17   Byrum, Rose Fillers, MD  metoprolol tartrate (LOPRESSOR) 50 MG tablet Take 1 tablet by mouth daily. 10/16/17   [provider]  nitroGLYCERIN (NITROSTAT) 0.4 MG SL tablet Place 1 tablet (0.4 mg total) under the tongue every 5 (five) minutes as needed for chest pain (x 3 tabs). 11/16/15   McDiarmid, Blane Ohara, MD  omeprazole (PRILOSEC) 20 MG capsule Take 1 capsule (20 mg total) by mouth daily. 09/14/17   Zenia Resides, MD  predniSONE (DELTASONE) 20 MG tablet Take 2 tablets (40 mg total) by mouth daily. Take for 7 days Patient not taking: Reported on 12/25/2017 11/02/17   McDiarmid, Blane Ohara, MD  traMADol (ULTRAM) 50 MG tablet TAKE 1 TABLET BY MOUTH EVERY 6 HOURS AS NEEDED FOR PAIN 03/17/17   McDiarmid, Blane Ohara, MD  vitamin B-12 (CYANOCOBALAMIN) 1000 MCG tablet Take 1 tablet (1,000 mcg total) by mouth daily. 09/14/17   Zenia Resides, MD    Family History Family History  Problem Relation Age of Onset  . Dementia Mother   . Heart disease Father   . Heart attack Father     Social History Social History   Tobacco Use  . Smoking status: Former Smoker    Packs/day: 1.00    Types: Cigarettes    Last attempt to quit: 03/02/2009    Years since quitting: 8.8  . Smokeless tobacco: Never Used  Substance Use Topics  . Alcohol  use: Yes    Alcohol/week: 13.0 standard drinks    Types: 7 Glasses of wine, 6 Standard drinks or equivalent per week    Comment: 5 oz white wine per day  . Drug use: No     Allergies   Baclofen; Codeine  phosphate; Hydrochlorothiazide; Montelukast sodium; and Norco [hydrocodone-acetaminophen]   Review of Systems Review of Systems  Constitutional: Negative for chills and fever.  HENT: Negative for congestion and rhinorrhea.   Eyes: Negative for visual disturbance.  Respiratory: Negative for cough and shortness of breath.   Cardiovascular: Negative for chest pain, palpitations and leg swelling.  Gastrointestinal: Negative for abdominal pain, nausea and vomiting.  Genitourinary: Negative for dysuria and flank pain.  Musculoskeletal: Positive for arthralgias and myalgias. Negative for back pain.  Skin: Positive for color change. Negative for rash and wound.  Neurological: Negative for dizziness and syncope.     Physical Exam Updated Vital Signs BP (!) 134/95   Pulse 85   Temp 97.8 F (36.6 C) (Oral)   Resp 18   Ht 5\' 7"  (1.702 m)   Wt 74.8 kg   SpO2 97%   BMI 25.84 kg/m   Physical Exam  Constitutional: She appears well-developed and well-nourished. No distress.  HENT:  Head: Normocephalic and atraumatic.  Mouth/Throat: Oropharynx is clear and moist.  Eyes: Pupils are equal, round, and reactive to light. Conjunctivae and EOM are normal.  Neck: Normal range of motion. Neck supple.  Cardiovascular: Normal rate, regular rhythm, S1 normal and S2 normal.  No murmur heard. Pulmonary/Chest: Effort normal and breath sounds normal. She has no wheezes. She has no rales.  Abdominal: Soft. She exhibits no distension. There is no tenderness. There is no guarding.  Musculoskeletal: Normal range of motion. She exhibits edema.  Left foot with erythema and soft tissue swelling over the dorsum of the foot. Ulceration over dorsum of foot.  No ecchymosis.  No ecchymosis over the plantar aspect of the foot. 2+ DP pulse. Compartments soft and well perfused.   Neurological: She is alert.  Cranial nerves grossly intact. Patient moves extremities symmetrically and with good coordination.  Skin: Skin  is warm and dry. No rash noted. There is erythema.  Psychiatric: She has a normal mood and affect. Her behavior is normal. Judgment and thought content normal.  Nursing note and vitals reviewed.    ED Treatments / Results  Labs (all labs ordered are listed, but only abnormal results are displayed) Labs Reviewed  COMPREHENSIVE METABOLIC PANEL - Abnormal; Notable for the following components:      Result Value   Glucose, Bld 108 (*)    BUN 35 (*)    Creatinine, Ser 1.76 (*)    GFR calc non Af Amer 24 (*)    GFR calc Af Amer 28 (*)    All other components within normal limits  CBC WITH DIFFERENTIAL/PLATELET - Abnormal; Notable for the following components:   Hemoglobin 11.9 (*)    Monocytes Absolute 1.3 (*)    All other components within normal limits  CULTURE, BLOOD (ROUTINE X 2)  CULTURE, BLOOD (ROUTINE X 2)  I-STAT CG4 LACTIC ACID, ED  I-STAT CG4 LACTIC ACID, ED    EKG None  Radiology Dg Tibia/fibula Left  Result Date: 01/10/2018 CLINICAL DATA:  Initial evaluation for acute pain for 1 week, no injury. EXAM: LEFT TIBIA AND FIBULA - 2 VIEW COMPARISON:  Prior radiograph of the left foot from 11/29/2017. FINDINGS: No acute fracture or dislocation about the left tibia/fibula.  Left total knee arthroplasty in place without obvious complication. Bones are osteopenic. Mild soft tissue swelling anterior to the left ankle, improved from prior radiograph. There is a new acute transverse nondisplaced fracture through the base of the right fifth metatarsal. IMPRESSION: 1. Acute transverse nondisplaced fracture through the base of the right fifth metatarsal. Finding better evaluated on concomitant radiograph of the left foot. 2. No acute fracture or dislocation about the left tibia/fibula. 3. Left knee arthroplasty in place without complication. Electronically Signed   By: Jeannine Boga M.D.   On: 01/10/2018 16:20   Dg Foot Complete Left  Result Date: 01/10/2018 CLINICAL DATA:   Initial evaluation for acute pain, no known injury, soft tissue swelling at the dorsal aspect of the foot. EXAM: LEFT FOOT - COMPLETE 3+ VIEW COMPARISON:  Prior radiograph from 11/29/2017. FINDINGS: Acute transverse nondisplaced fracture through the base of the left fifth metatarsal, new relative to recent radiograph. No other acute fracture or dislocation. Joint spaces maintained without evidence for significant degenerative or erosive arthropathy. Mild soft tissue swelling at the dorsal aspect of the foot. Tiny posterior and plantar calcaneal enthesophytes noted. Osteopenia. IMPRESSION: 1. Acute nondisplaced transverse fracture through the base of the left fifth metatarsal. 2. Mild soft tissue swelling at the dorsal aspect of the left foot. Electronically Signed   By: Jeannine Boga M.D.   On: 01/10/2018 16:22    Procedures Procedures (including critical care time)  Medications Ordered in ED Medications  oxyCODONE-acetaminophen (PERCOCET/ROXICET) 5-325 MG per tablet 0.5 tablet (0.5 tablets Oral Given 01/10/18 1552)  Tdap (BOOSTRIX) injection 0.5 mL (0.5 mLs Intramuscular Given 01/10/18 1632)     Initial Impression / Assessment and Plan / ED Course  I have reviewed the triage vital signs and the nursing notes.  Pertinent labs & imaging results that were available during my care of the patient were reviewed by me and considered in my medical decision making (see chart for details).  Clinical Course as of Jan 11 1752  Wed Jan 10, 2018  1720 Neutrophils: 17 [AM]  1720 Improved from prior.  Hemoglobin(!): 11.9 [AM]  1752 Pt reports no improvement in pain. Will order additional half dose of percocet. Continuous pulse oximetry ordered and verified with Rn.    [AM]    Clinical Course User Index [AM] Albesa Seen, PA-C    Patient nontoxic-appearing, hemodynamically stable, and neurovascular intact in the left lower extremity.  Extremity appears cellulitic.  Will order x-ray to assess  for underlying injury.  Patient does have calf tenderness and swelling, therefore will assess with DVT study.  Lab work without leukocytosis.  Patient's lactic acid is normal.  Blood cultures are collected. Radiograph of left foot demonstrating fractures of the base of the left fifth metatarsal. Also appears to affect left 4th metatarsal. Unclear when this acute but appears acute on x-ray.  Ancef initiated for left lower extremity cellulitis.  IVF for AKI. Will admit to hospitalist.  Care signed out to Margarita Mail, PA-C at 5:43 PM to facilitate admission.   Final Clinical Impressions(s) / ED Diagnoses   Final diagnoses:  Closed nondisplaced fracture of fifth metatarsal bone of left foot, initial encounter  Left leg cellulitis    ED Discharge Orders    None       Albesa Seen, PA-C 01/10/18 1743    Albesa Seen, PA-C 01/10/18 1754    Lennice Sites, DO 01/10/18 1911

## 2018-01-10 NOTE — Progress Notes (Signed)
CSW met with pt and pt's daughters, [name] and Kathlee Nations. Zell Doucette, [phone], is pt's Healthcare POA. CSW explained that SNF requires a recommendation from physical therapy. PT consult placed to see pt upstairs.  Pt has been to [facility] 8 years ago. Pt and pt's family first preference is [facility], opened to other SNF.   Wendelyn Breslow, [facility]  [phone]

## 2018-01-10 NOTE — Progress Notes (Signed)
Left lower extremity venous duplex completed. Preliminary results - There is no evidence of a DVT or Baker's cyst. [name] 01/10/2018, 6:13 PM

## 2018-01-10 NOTE — ED Triage Notes (Signed)
Pt here from home with c/o left calf and foot pain that started 2 days ago , foot is red warm and painful to touch

## 2018-01-10 NOTE — H&P (Addendum)
[facility] Admission History and Physical Service Pager: [phone]  Patient name: [name] Medical record number: [id number] Date of birth: 01-Apr-1923 Age: 84 y.o. Gender: female  Primary Care Provider: McDiarmid, Blane Ohara, MD Consultants: None  Code Status: FULL    Chief Complaint: pain in left foot  Assessment and Plan: [name] is a 84 y.o. female presenting with cellulitis and left metatarsal fracture. PMH is significant for HFpEF, GERD, A. Fib on eliquis, Hypercholesterolemia, memory loss, urge incontinence, history of myocardial infarction, sick sinus syndrome, COPD, and lumbar spinal stenosis.  Cellulitis: The patient's temperature on admission is 98.4 F, and other vitals remain stable. Lactic acid normal at 1.29. White blood cell count is normal at 7.9. The patient denies systemic symptoms such as fever, chills, and myalgias.  Patient reports hitting her dorsal foot on a tub about 3 weeks ago, and since then has had pain, swelling and bruising in the area. Tib/fib and left foot x-ray showing acute nondisplaced transverse fracture through the base of the left fifth metatarsal with mild soft tissue swelling of the dorsal aspect of the left foot.  DVT ruled out with negative doppler. The patient presents with a healing laceration to the dorsal aspect of her left foot, which is surrounded by a warm erythematous, blanchable area concerning for cellulitis and was started on IV Ancef in the ED. She also received po oxycodone for pain, 500 cc bolus of normal saline and a Tdap booster.  Will admit for IV antibiotics and further management.  -Admit to med-surg, attending Dr. Mingo Amber -cont IV cefazolin 2 g every 12 hours; de-escalate once improving -PT/OT -A.m. BMP, CBC, blood cultures -Tylenol as needed mild to moderate pain; tramadol for mod to severe pain -Up with assistance -Vitals per protocol  Left foot pain secondary to fifth metatarsal fracture: Tib/fib  XR negative without evidence of fracture or dislocation.  Left foot x-ray showing acute nondisplaced transverse fracture through the base of the left fifth metatarsal with mild soft tissue swelling of the dorsal aspect of the left foot. There is no report of a specific trauma causing the fracture. -received Percocet 5-325 in ED  -Pain management: Tylenol as needed mild to moderate pain; tramadol for moderate to severe pain -Frequent pain assessment -Up with assistance  Atrial fibrillation: Rate controlled. Patient in Afib with HR between 94 and 114 bpm during the interview. EKG shows the same. Patient takes amiodarone 200 mg daily Monday through Friday and Eliquis 2.5 mg twice daily, reports good compliance with anticoagulation therapy.  -Continuous cardiac monitoring -Continue amiodarone and Eliquis -Vitals per protocol  COPD: Patient uses albuterol nebulizer, Pulmicort, and DuoNeb daily for control of COPD.  On the day of admission, patient reports she has not taken her medications this morning.  Wheezing is auscultated throughout the lungs bilaterally on physical exam.  Patient denies shortness of breath during interview and appears comfortable on exam.  -Albuterol nebulizer every 6 hours as needed wheezing, shortness of breath -Continue Pulmicort twice daily and DuoNeb every 6 hours as needed wheezing and shortness of breath.  HFpEF: last echo Aug 2019 shows LV EF 50-55%, exam was insufficient to evaluate diastolic function. Patient has chronic lower extremity edema, present on exam. Patient also with bilateral crackles in lower lung bases.  Patient takes Lasix 20 mg daily for management of lower extremity edema. -Continue Lasix 20 mg daily  -Vitals per protocol  History of MI and sick sinus syndrome with pacemaker: Patient's most  recent lipid panel in June 2018 is all within normal limits, except HDL slightly decreased at 37.  Patient prescribed metoprolol 25 mg, nitroglycerin 0.4 mg and  Lipitor 20 mg daily. -Continue metoprolol and Lipitor daily -Nitroglycerin prn chest pain  GERD: Patient has a history of such.  Takes omeprazole at home. -Protonix while inpatient   FEN/GI: Saline lock, heart healthy diet, Miralax Prophylaxis: continue home eliquis  Disposition: Admit to med-surg  History of Present Illness:  [name] is a 84 y.o. female presenting with L foot pain after hitting it against a tub about 3 weeks ago.  The history is provided by the patient and her daughter, who is also her medical power of attorney.  The daughter reports that "several weeks ago" the patient hit the top of her left foot on the tub while getting into the tub.  She denies hitting her head or losing consciousness.  At the time, a large, tender bruise formed.  The patient's skin is thin and she is on a blood thinner.  She called the PCPs office, thought it likely was not broken, and sent help out to the house to keep the foot properly wrapped.  The patient reports she began having some difficulty and pain walking on the left foot about 1 week ago.  The patient normally uses a walker to assist with ambulation.  She denies any other insults to the foot since hitting on the tub about 3 weeks prior.  The pain progressively worsened and the patient had continued difficulty using the foot.  She reports "everything was internal".  The patient is normally able to perform all of her ADLs by herself.  The patient lives at home with her daughter, who is a Catering manager.  She is often away on work, in which the patient's other daughter step in to help.  The family is interested in having the patient placed in a SNF so that she can have more care at home. The daughter will soon be leaving for several weeks.  The patient did not take any of her medications today.    Daughter reports the patient sometimes struggles with short term memory.  She states this recently became worse after the patient had a crick in her  neck that did not resolve for a couple of days, for which her other daughter gave her a muscle relaxer to address the problem.  Review Of Systems: Per HPI with the following additions:   Review of Systems  Constitutional: Negative for chills and fever.  Respiratory: Positive for shortness of breath.   Cardiovascular: Negative for chest pain.  Gastrointestinal: Negative for abdominal pain, nausea and vomiting.  Genitourinary: Negative for dysuria.  Musculoskeletal:       +Left foot tenderness with single open lesion over dorsum, surrounding erythema and 2+ pitting edema  Neurological: Negative for dizziness, focal weakness, loss of consciousness, weakness and headaches.  Endo/Heme/Allergies: Bruises/bleeds easily.    Patient Active Problem List   Diagnosis Date Noted  . Wound of left foot 12/29/2017    Class: Acute  . Traumatic ecchymosis of left foot 12/07/2017  . Fracture of 5th metatarsal, Right, avulsion fx of tip styloid 11/09/2017  . Laceration of arm, unspecified laterality, initial encounter 11/03/2017  . Acute on chronic diastolic heart failure ([place]) 10/16/2017  . Venous insufficiency of both lower extremities 09/21/2017  . Impaired mobility and ADLs 09/01/2017  . Other fatigue 06/30/2017  . Bilateral leg edema 01/10/2017  . Fall 10/27/2016  .  Chronic diastolic CHF (congestive heart failure) ([place]) 11/08/2015  . GERD (gastroesophageal reflux disease) 11/08/2015  . On amiodarone therapy 09/14/2015  . Paroxysmal atrial fibrillation (HCC)   . History of iron deficiency 01/16/2015  . Venous stasis ulcer of ankle, left ([place]) 10/24/2014  . Pure hypercholesterolemia 07/31/2014  . CAD S/P RCA BMS, residual CFX disease 06/08/2014  . Persistent atrial fibrillation ([place]) 05/31/2014  . Pacemaker 04/02/2013  . Cognitive impairment 10/26/2012  . Chronic anticoagulation 10/01/2012  . Urge incontinence 12/13/2011  . Vitamin D deficiency 11/02/2010  . Macular degeneration, bilateral  10/04/2010    Class: Chronic  . Iron deficiency anemia due to chronic blood loss 08/05/2010  . Solitary kidney, acquired 05/17/2010  . Spondylolisthesis of lumbar region   . VITAMIN B12 DEFICIENCY 10/07/2009  . Chronic kidney disease (CKD), stage IV (severe) ([place]) 09/02/2009  . MYOCARDIAL INFARCTION, HX OF 09/25/2008  . VENTRICULAR HYPERTROPHY, LEFT 08/28/2008  . At risk for diabetes mellitus 09/11/2007  . SICK SINUS SYNDROME 04/27/2006  . Allergic rhinitis, seasonal 04/27/2006  . COPD, severe ([place]) 04/27/2006  . GASTROESOPHAGEAL REFLUX, NO ESOPHAGITIS 04/27/2006  . Osteoarthrosis involving lower leg 04/27/2006  . LUMBAR SPINAL STENOSIS 04/27/2006   Past Medical History: Past Medical History:  Diagnosis Date  . Abnormal mammogram, unspecified 08/23/2010   Followup imaging reassuring.   . Acute appendicitis with rupture   . ADENOMATOUS COLONIC POLYP 03/01/2003   Qualifier: Diagnosis of  By: McDiarmid MD, Sherren Mocha Multiple benign polyps of cecum, ascending, transverse and sigmoid colon by 8/09 colonoscopy by Dr Cristina Gong  Colonoscopy by Dr Cristina Gong for iron-deficiency anemia on 04/27/2010 showed three sessile polyps that were in ascending (3 mm x 9 mm), transverse (4 mm), and cecum (3 mm).  All three were tubular adenomas that were negative for high grade dysplasia or malignancy on pathology. Dr Cristina Gong called the polpys benign and not requiring follow-up in view of the patients age.    . Adrenal adenoma    Incidentaloma  . AF (paroxysmal atrial fibrillation) ([place]) 11/11/2010   Hospitalization (9/8-9/10, Dr Daneen Schick, III, Cardiology) for Paroxysmal Atrial Fibrillation with RVR and anginal pain secondary to demand/supply mismatch in setting of RVR with known circumflex artery branch disease.    Marland Kitchen ANXIETY 04/27/2006   Qualifier: Diagnosis of  By: McDiarmid MD, Sherren Mocha    . Benign essential tremor 02/04/2016  . Candidiasis of the esophagus 11/26/2007  . CAP (community acquired pneumonia) 02/25/2015  .  Cataract 2013   Bilateral   . Cellulitis of leg, right 10/01/2012  . Cellulitis of right lower extremity   . Chest pain with moderate risk of acute coronary syndrome 05/21/2015  . Chronic kidney disease (CKD), stage III (moderate) (HCC)   . Concussion with loss of consciousness   . COPD 04/27/2006   Qualifier: Diagnosis of  By: McDiarmid MD, Sherren Mocha    . COPD exacerbation ([place])   . COPD, severe   . Decreased functional mobility and endurance 03/17/2015  . Diastolic heart failure ([place]) 07/30/2015  . DISC WITH RADICULOPATHY 04/27/2006   Qualifier: Diagnosis of  By: McDiarmid MD, Sherren Mocha    . Dyspnea   . EDEMA-LEGS,DUE TO VENOUS OBSTRUCT. 04/27/2006   Qualifier: Diagnosis of  By: McDiarmid MD, Sherren Mocha    . 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 Normal uric acid serum level at time of attack.    Marland Kitchen HERNIA, HIATAL, NONCONGENITAL 04/27/2006   Qualifier: Diagnosis  of  By: McDiarmid MD, Sherren Mocha    . High risk medications (not anticoagulants) long-term use 03/05/2012  . History of Hemorrhoids 04/27/2006   Qualifier: Diagnosis of  By: McDiarmid MD, Sherren Mocha    . History of iron deficiency 01/16/2015  . Hx of colonoscopy with polypectomy 04/27/2010   Dr Cristina Gong found three  tubular adenomas each less than 10 mm size  . Hypertension   . Hypotension 07/09/2015  . Iron deficiency anemia 08/05/2010   Dr Cristina Gong (GI) has evaluated with EGD, colonoscopy, and video capsular endoscopy in 2011 & 2012.  All have been unrevealing as to an origin of IDA.  OV with Dr Cristina Gong (10/28/10) assessment of blood in stool per hemoccult and GER. Hbg 12.1 g/dL, MCV 91.8, Ferritin 30 ng/mL. Patient taking on ferrous sulfate tab daily.   EGD on 03/06/12 by Dr Cristina Gong for IDA non-obstructing Schatzki's ring at Gastroesophageal junction, otherwise normal esophagus and stomach.     . Leg cramps 05/29/2012  . Lumbar herniated disc    History of HNP L4/5 in 2003  . Macular  degeneration, bilateral 10/04/2010   Right eye is wet MD, the other is dry macular degeneration (ARMD). Pt undergoing some form of vascular endothelial growth factor inhibition intraocular therapy.    . Mild cognitive impairment 10/26/2012   (10/25/12) Failed MiniCog screen  . MUSCLE CRAMPS 03/11/2010   Qualifier: Diagnosis of  By: McDiarmid MD, Sherren Mocha    . Muscle spasm of back 09/24/2013  . Myocardial infarct, old   . Nocturia 10/28/2016  . Numbness and tingling in hands 07/21/2011  . Pacemaker    MDT  . PREDIABETES 09/11/2007   Qualifier: Diagnosis of  By: McDiarmid MD, Sherren Mocha    . Retinal hemorrhage of left eye 06/2010  . RHINITIS, ALLERGIC 04/27/2006   Qualifier: Diagnosis of  By: McDiarmid MD, Sherren Mocha    . SCHATZKI'S RING, HX OF 11/26/2007   Qualifier: Diagnosis of  By: McDiarmid MD, Sherren Mocha  An EGD was performed by Dr Cristina Gong on 04/27/2010 for iron deficiency anemia. There was a a transient hiatal hernia with Schatzki's ring. Stomach and duodenum were normal. EGD on 03/06/12 by Dr Cristina Gong for IDA non-obstructing Schatzki's ring at Gastroesophageal junction, otherwise normal esophagus and stomach.    . Seborrheic keratosis, right anterior thigh 12/12/2013  . Shoulder pain, left 01/09/2014  . Sick sinus syndrome with tachycardia ([place])    MDT  . Soft tissue injury of foot 05/03/2011  . Solar lentigo 06/15/2012  . Solitary kidney, acquired 05/17/2010   Surgical removal for transitional cell cancer by Tresa Endo, MD ([place]). Surveillance cystoscopy by Dr Alinda Money [facility]) on 10/19/12 without evidence of cystoscopic recurrence. Recommend RTC one year for cystoscopy.   Marland Kitchen Spinal stenosis, lumbar   . Transitional cell carcinoma of ureter, history   . Urge incontinence 12/13/2011   Diagnosed in 10/2011 by Dr Bjorn Loser (Urology)   . VENTRICULAR HYPERTROPHY, LEFT 08/28/2008   Qualifier: Diagnosis of  By: McDiarmid MD, Sherren Mocha    . VITAMIN B12 DEFICIENCY 10/07/2009   Qualifier: Diagnosis of  By: McDiarmid  MD, Sherren Mocha  Dx based on a post-TKR anemia work-up Low normal serum B12 with high Methylmalonic acid and homocysteine level  Vit B12 serum level (10/28/10) > 1500 pg/mL   . Vitamin D deficiency 11/02/2010   Serum vitamin D 25(OH) = 10.9 ng/mL (30 -100) on 10/28/10 c/w Vitamin D deficiency.      Past Surgical History: Past Surgical History:  Procedure Laterality Date  .  BREAST SURGERY     breast reduction  . CARDIOVERSION N/A 10/20/2017   Procedure: CARDIOVERSION;  Surgeon: Josue Hector, MD;  Location: [facility] ENDOSCOPY;  Service: Cardiovascular;  Laterality: N/A;  . CHOLECYSTECTOMY    . CORONARY ANGIOPLASTY WITH STENT PLACEMENT  2010   BMS RCA, OM2 occluded  . CYSTOSCOPY  09/30/2015   No cystoscopic evidence of uroepithelial neoplasm.  Follow up surveillance cystoscopy in one year  . CYSTOSCOPY  10/13/2017   Dr Raynelle Bring [facility])  . DUPUYTREN CONTRACTURE RELEASE Right 05/22/2014   Procedure: DUPUYTREN RELEASE AND REPAIR AS NECESSARY RIGHT RING FINGER AND MIDDLE FINGER;  Surgeon: Roseanne Kaufman, MD;  Location: [place];  Service: Orthopedics;  Laterality: Right;  . ESOPHAGOGASTRODUODENOSCOPY  04/27/2010   Dr Cristina Gong - found transient H/H & Schatzki's ring  . ESOPHAGOGASTRODUODENOSCOPY ENDOSCOPY  03/06/2012   Dr Cristina Gong - found non-obstuctive Schatzki's ring at [employer] o/w normal EGD.   Marland Kitchen EYE SURGERY     bilateral cataracts  . KNEE ARTHROSCOPY W/ SYNOVECTOMY  11/2009, left knee   Dr Wynelle Link  . NEPHRECTOMY  For transition cell cancer    Dr Tresa Endo, surgeon  . PACEMAKER GENERATOR CHANGE N/A 11/12/2012   Procedure: PACEMAKER GENERATOR CHANGE;  Surgeon: Evans Lance, MD; [employer] [facility] dual-chamber pacemaker serial number [id number]; Laterality: Right  . PACEMAKER INSERTION  2005   Dr Doreatha Lew  . PACEMAKER LEAD REMOVAL  2005   Removal and reinsertion of atrial and ventricular leads due to migration  . REPLACEMENT TOTAL KNEE  2009, Right knee   Dr Wynelle Link  . REPLACEMENT TOTAL KNEE   05/2009, Left knee   Dr Wynelle Link  . TONSILLECTOMY    . TRIGGER FINGER RELEASE Right 05/22/2014   Procedure: RIGHT HAND A-1 PULLEY RELEASE ;  Surgeon: Roseanne Kaufman, MD;  Location: [place];  Service: Orthopedics;  Laterality: Right;   Social History: Social History   Tobacco Use  . Smoking status: Former Smoker    Packs/day: 1.00    Types: Cigarettes    Last attempt to quit: 03/02/2009    Years since quitting: 8.8  . Smokeless tobacco: Never Used  Substance Use Topics  . Alcohol use: Yes    Alcohol/week: 13.0 standard drinks    Types: 7 Glasses of wine, 6 Standard drinks or equivalent per week    Comment: 5 oz white wine per day  . Drug use: No   Additional social history: Lives at home with her daughter. Former tobacco use about 12 years ago, reports alcohol use, about 2 glasses of wine a night. No illicit drug use.   Please also refer to relevant sections of EMR.  Family History: Family History  Problem Relation Age of Onset  . Dementia Mother   . Heart disease Father   . Heart attack Father     Allergies and Medications: Allergies  Allergen Reactions  . Baclofen Other (See Comments)    Confusion occurred with taking 3 at the same time  . Codeine Phosphate Nausea Only  . Hydrochlorothiazide Other (See Comments)    Possible acute gout or arthritis of wrist  . Montelukast Sodium Other (See Comments)    Reaction not recalled ??  Lebron Quam [Hydrocodone-Acetaminophen] Nausea And Vomiting   No current facility-administered medications on file prior to encounter.    Current Outpatient Medications on File Prior to Encounter  Medication Sig Dispense Refill  . albuterol (PROVENTIL) (2.5 MG/3ML) 0.083% nebulizer solution Take 3 mLs (2.5 mg total) by nebulization every  6 (six) hours as needed for wheezing or shortness of breath. 360 mL 11  . amiodarone (PACERONE) 200 MG tablet Take one tablet by mouth daily Monday through Friday.  Do not take on Saturday or Sunday. 90 tablet 3  .  apixaban (ELIQUIS) 2.5 MG TABS tablet Take 1 tablet (2.5 mg total) by mouth 2 (two) times daily. 60 tablet 10  . atorvastatin (LIPITOR) 20 MG tablet TAKE 1 TABLET BY MOUTH DAILY 90 tablet 3  . budesonide (PULMICORT) 0.5 MG/2ML nebulizer solution Take 2 mLs (0.5 mg total) by nebulization 2 (two) times daily. 120 mL 12  . fluticasone (FLONASE) 50 MCG/ACT nasal spray SHAKE LIQUID AND USE 2 SPRAYS IN EACH NOSTRIL DAILY 48 g 3  . furosemide (LASIX) 20 MG tablet Take 1 tablet (20 mg total) by mouth daily. 90 tablet 3  . ipratropium-albuterol (DUONEB) 0.5-2.5 (3) MG/3ML SOLN USE 3 ML VIA NEBULIZER EVERY 4 HOURS 540 mL 0  . metoprolol succinate (TOPROL-XL) 25 MG 24 hr tablet Take 25 mg by mouth daily.    . nitroGLYCERIN (NITROSTAT) 0.4 MG SL tablet Place 1 tablet (0.4 mg total) under the tongue every 5 (five) minutes as needed for chest pain (x 3 tabs). 25 tablet PRN  . omeprazole (PRILOSEC) 20 MG capsule Take 1 capsule (20 mg total) by mouth daily. 90 capsule 3  . traMADol (ULTRAM) 50 MG tablet TAKE 1 TABLET BY MOUTH EVERY 6 HOURS AS NEEDED FOR PAIN 30 tablet 5  . azithromycin (ZITHROMAX) 250 MG tablet Take one tablet daily for 5 days (Patient not taking: Reported on 12/25/2017) 5 each 2  . cefUROXime (CEFTIN) 250 MG tablet Take 1 tablet (250 mg total) by mouth 2 (two) times daily with a meal. (Patient not taking: Reported on 12/25/2017) 14 tablet 2  . doxycycline (VIBRA-TABS) 100 MG tablet Take 1 tablet (100 mg total) by mouth 2 (two) times daily. (Patient not taking: Reported on 12/25/2017) 14 tablet 2  . predniSONE (DELTASONE) 20 MG tablet Take 2 tablets (40 mg total) by mouth daily. Take for 7 days (Patient not taking: Reported on 12/25/2017) 14 tablet 0  . vitamin B-12 (CYANOCOBALAMIN) 1000 MCG tablet Take 1 tablet (1,000 mcg total) by mouth daily. (Patient not taking: Reported on 01/10/2018) 90 tablet 3   Objective: BP (!) 134/95   Pulse 85   Temp 97.8 F (36.6 C) (Oral)   Resp 18   Ht 5\' 7"   (1.702 m)   Wt 74.8 kg   SpO2 97%   BMI 25.84 kg/m   Physical Exam  Constitutional: She appears well-developed and well-nourished.  Frail  HENT:  Head: Normocephalic and atraumatic.  Eyes: EOM are normal.  Cardiovascular: Intact distal pulses.  No murmur heard. Irregularly irregular, often tachycardic into 110s  Pulmonary/Chest: Effort normal. She has wheezes (Throughout).  Bilateral lower lobe crackles  Abdominal: Soft. Bowel sounds are normal. There is no tenderness.  Musculoskeletal:  Left lower extremity trace pitting edema, warm and tender to touch  Neurological: She is alert.  Patient often repeats stories or comments throughout the conversation  Skin: Skin is dry. There is pallor.   Labs and Imaging: CBC BMET  Recent Labs  Lab 01/10/18 1521  WBC 7.9  HGB 11.9*  HCT 39.3  PLT 197   Recent Labs  Lab 01/10/18 1521  NA 137  K 3.8  CL 98  CO2 26  BUN 35*  CREATININE 1.76*  GLUCOSE 108*  CALCIUM 9.0      Lactic  acid: 1.29 HbA1c: 5.5  Dg Tibia/fibula Left  Result Date: 01/10/2018 CLINICAL DATA:  Initial evaluation for acute pain for 1 week, no injury. EXAM: LEFT TIBIA AND FIBULA - 2 VIEW COMPARISON:  Prior radiograph of the left foot from 11/29/2017. FINDINGS: No acute fracture or dislocation about the left tibia/fibula. Left total knee arthroplasty in place without obvious complication. Bones are osteopenic. Mild soft tissue swelling anterior to the left ankle, improved from prior radiograph. There is a new acute transverse nondisplaced fracture through the base of the right fifth metatarsal. IMPRESSION: 1. Acute transverse nondisplaced fracture through the base of the right fifth metatarsal. Finding better evaluated on concomitant radiograph of the left foot. 2. No acute fracture or dislocation about the left tibia/fibula. 3. Left knee arthroplasty in place without complication. Electronically Signed   By: Jeannine Boga M.D.   On: 01/10/2018 16:20   Dg  Foot Complete Left  Result Date: 01/10/2018 CLINICAL DATA:  Initial evaluation for acute pain, no known injury, soft tissue swelling at the dorsal aspect of the foot. EXAM: LEFT FOOT - COMPLETE 3+ VIEW COMPARISON:  Prior radiograph from 11/29/2017. FINDINGS: Acute transverse nondisplaced fracture through the base of the left fifth metatarsal, new relative to recent radiograph. No other acute fracture or dislocation. Joint spaces maintained without evidence for significant degenerative or erosive arthropathy. Mild soft tissue swelling at the dorsal aspect of the foot. Tiny posterior and plantar calcaneal enthesophytes noted. Osteopenia. IMPRESSION: 1. Acute nondisplaced transverse fracture through the base of the left fifth metatarsal. 2. Mild soft tissue swelling at the dorsal aspect of the left foot. Electronically Signed   By: Jeannine Boga M.D.   On: 01/10/2018 16:22   Vas [place] Lower Extremity Venous (dvt) (only [facility])  Result Date: 01/10/2018  Lower Venous Study Indications: Pain, Swelling, and Erythema.  Risk Factors: Trauma Broken left foot. Performing Technologist: Toma Copier RVS  Examination Guidelines: A complete evaluation includes B-mode imaging, spectral Doppler, color Doppler, and power Doppler as needed of all accessible portions of each vessel. Bilateral testing is considered an integral part of a complete examination. Limited examinations for reoccurring indications may be performed as noted.  Right Venous Findings: +---+---------------+---------+-----------+----------+-------+    CompressibilityPhasicitySpontaneityPropertiesSummary +---+---------------+---------+-----------+----------+-------+ CFVFull           Yes      Yes                          +---+---------------+---------+-----------+----------+-------+ SFJFull                                                 +---+---------------+---------+-----------+----------+-------+  Left Venous Findings:  +---------+---------------+---------+-----------+----------+-------+          CompressibilityPhasicitySpontaneityPropertiesSummary +---------+---------------+---------+-----------+----------+-------+ CFV      Full           Yes      Yes                          +---------+---------------+---------+-----------+----------+-------+ SFJ      Full                                                 +---------+---------------+---------+-----------+----------+-------+  FV Prox  Full           Yes      Yes                          +---------+---------------+---------+-----------+----------+-------+ FV Mid   Full                                                 +---------+---------------+---------+-----------+----------+-------+ FV DistalFull           Yes      Yes                          +---------+---------------+---------+-----------+----------+-------+ PFV      Full           Yes      Yes                          +---------+---------------+---------+-----------+----------+-------+ POP      Full           Yes      Yes                          +---------+---------------+---------+-----------+----------+-------+ PTV      Full                                                 +---------+---------------+---------+-----------+----------+-------+ PERO     Full                                                 +---------+---------------+---------+-----------+----------+-------+    Summary: Right: There is no evidence of a common femoral vein obstruction. Left: There is no evidence of deep vein thrombosis in the lower extremity. No cystic structure found in the popliteal fossa.  *See table(s) above for measurements and observations. Electronically signed by Harold Barban MD on 01/10/2018 at 7:14:32 PM.    Final     Daisy Floro, DO 01/10/2018, 7:36 PM PGY-1, [facility] Intern pager: [phone], text pages welcome  I have seen and  evaluated the above patient with Dr. [name] and agree with her documentation.  I am including my edits in blue.   Lovenia Kim MD  [facility] PGY3

## 2018-01-10 NOTE — ED Provider Notes (Signed)
Patient 26 with Left sided cellulitis. Labs pending. +/- dvt. Work up pending, expect admission.   xray noted to show acute metatarsal fracture 4 and 5 of the left foot. Unknown injury.  DVT study negative.  Unable to ambulate.  Patient will be admitted to the family medicine service.   Margarita Mail, PA-C 01/10/18 2340    Maudie Flakes, MD 01/11/18 0001

## 2018-01-10 NOTE — Clinical Social Work Note (Signed)
Clinical Social Work Assessment  Patient Details  Name: [name] MRN: [id number] Date of Birth: [birth year]  Date of referral:  01/10/18               Reason for consult:  Facility Placement                Permission sought to share information with:  Facility Sport and exercise psychologist, Family Supports Permission granted to share information::  Yes, Verbal Permission Granted  Name::        Agency::     Relationship::     Contact Information:     Housing/Transportation Living arrangements for the past 2 months:  Single Family Home Source of Information:  Patient, Adult Children Patient Interpreter Needed:  None Criminal Activity/Legal Involvement Pertinent to Current Situation/Hospitalization:    Significant Relationships:  Adult Children Lives with:  Adult Children(Lives with one daughter, daughter flight attendant) Do you feel safe going back to the place where you live?  No Need for family participation in patient care:  Yes (Comment)  Care giving concerns:  CSW consulted for SNF Placement. CSW reviewed requirements for Medicare to cover SNF placement. Pt does have [employer] registry banner.     Social Worker assessment / plan:  CSW met with pt and pt's two daughters, Kathlee Nations and [name]. [name] is [employer] POA. Pt lives with another daughter, Truman Hayward. Pt's daughter Truman Hayward is a flight attendant and will be living for work soon. Pt and pt's family are interested in SNF placement after pt's hospital visit. CSW explained that SNF would need to be recommended by Physical Therapy and medical team. Pt's family understanding. Physical Therapy consult has been placed. Pt's family prefers pt go to [facility], but are open to other options. Pt's daughter, [name], [place] requested to be kept up to date on medical discussions.  CSW provided family with list of SNFs in the [place] area.   Employment status:  Retired Forensic scientist:  Medicare PT Recommendations:  Not assessed at this  time [place] / Referral to community resources:  [place]  Patient/Family's Response to care:  Pt is agreeable to plan of care. Pt and pt's family are agreeable to SNF Placement if that is the plan decided upon.   Patient/Family's Understanding of and Emotional Response to Diagnosis, Current Treatment, and Prognosis:  Pt and pt's family understanding of current plan care. Pt and pt's family did not have any questions for CSW at this time.   Emotional Assessment Appearance:  Appears stated age Attitude/Demeanor/Rapport:    Affect (typically observed):  Accepting, Adaptable, Appropriate, Pleasant Orientation:  Oriented to Self, Oriented to Place, Oriented to  Time, Oriented to Situation Alcohol / Substance use:    Psych involvement (Current and /or in the community):  No (Comment)  Discharge Needs  Concerns to be addressed:  Home Safety Concerns Readmission within the last 30 days:  No Current discharge risk:  Chronically ill Barriers to Discharge:  Continued Medical Work up   [employer], LCSW 01/10/2018, 7:32 PM

## 2018-01-10 NOTE — Progress Notes (Signed)
Pharmacy Antibiotic Note  [name] is a 84 y.o. female admitted on 01/10/2018 with cellulitis.  Pharmacy has been consulted for cefazolin dosing.  Currently, afebrile and WBC wnl. PMH CKD III, Scr 1.76 mg/dL, est CrCl 22 mL/min.  Plan: Cefazolin 2 gm IV q12h Monitor renal function, cultures, and LOT  Height: 5\' 7"  (170.2 cm) Weight: 165 lb (74.8 kg) IBW/kg (Calculated) : 61.6  Temp (24hrs), Avg:97.8 F (36.6 C), Min:97.8 F (36.6 C), Max:97.8 F (36.6 C)  Recent Labs  Lab 01/10/18 1521 01/10/18 1548  WBC 7.9  --   CREATININE 1.76*  --   LATICACIDVEN  --  1.29    Estimated Creatinine Clearance: 22 mL/min (A) (by C-G formula based on SCr of 1.76 mg/dL (H)).    Allergies  Allergen Reactions  . Baclofen Other (See Comments)    Confusion occurred with taking 3 at the same time  . Codeine Phosphate Nausea Only  . Hydrochlorothiazide Other (See Comments)    Possible acute gout or arthritis of wrist  . Montelukast Sodium Other (See Comments)    Reaction not recalled ??  Lebron Quam [Hydrocodone-Acetaminophen] Nausea And Vomiting    Antimicrobials this admission: Cefazolin 11/13>>   Microbiology results: 11/13 BCx:  Thank you for allowing pharmacy to be a part of this patient's care.  Willia Craze, Pharmacy Student

## 2018-01-11 ENCOUNTER — Other Ambulatory Visit: Payer: Self-pay

## 2018-01-11 ENCOUNTER — Encounter (HOSPITAL_COMMUNITY): Payer: Self-pay

## 2018-01-11 DIAGNOSIS — J449 Chronic obstructive pulmonary disease, unspecified: Secondary | ICD-10-CM

## 2018-01-11 DIAGNOSIS — S92355A Nondisplaced fracture of fifth metatarsal bone, left foot, initial encounter for closed fracture: Secondary | ICD-10-CM

## 2018-01-11 DIAGNOSIS — I4891 Unspecified atrial fibrillation: Secondary | ICD-10-CM

## 2018-01-11 DIAGNOSIS — L03119 Cellulitis of unspecified part of limb: Secondary | ICD-10-CM

## 2018-01-11 DIAGNOSIS — I4821 Permanent atrial fibrillation: Secondary | ICD-10-CM

## 2018-01-11 LAB — BASIC METABOLIC PANEL
Anion gap: 10 (ref 5–15)
BUN: 35 mg/dL — AB (ref 8–23)
CALCIUM: 8.5 mg/dL — AB (ref 8.9–10.3)
CHLORIDE: 104 mmol/L (ref 98–111)
CO2: 25 mmol/L (ref 22–32)
CREATININE: 1.69 mg/dL — AB (ref 0.44–1.00)
GFR calc non Af Amer: 25 mL/min — ABNORMAL LOW (ref >60)
GFR, EST AFRICAN AMERICAN: 29 mL/min — AB (ref >60)
Glucose, Bld: 126 mg/dL — ABNORMAL HIGH (ref 70–99)
Potassium: 4 mmol/L (ref 3.5–5.1)
SODIUM: 139 mmol/L (ref 135–145)

## 2018-01-11 LAB — CBC
HCT: 35.4 % — ABNORMAL LOW (ref 36.0–46.0)
HEMOGLOBIN: 10.3 g/dL — AB (ref 12.0–15.0)
MCH: 27.5 pg (ref 26.0–34.0)
MCHC: 29.1 g/dL — ABNORMAL LOW (ref 30.0–36.0)
MCV: 94.7 fL (ref 80.0–100.0)
NRBC: 0 % (ref 0.0–0.2)
Platelets: 196 10*3/uL (ref 150–400)
RBC: 3.74 MIL/uL — AB (ref 3.87–5.11)
RDW: 13.5 % (ref 11.5–15.5)
WBC: 7.7 10*3/uL (ref 4.0–10.5)

## 2018-01-11 NOTE — Discharge Instructions (Signed)

## 2018-01-11 NOTE — Patient Outreach (Signed)
[facility]) Care Management  01/11/2018  DEBROH SIELOFF 02-23-1924 [id number]    RN Health Coach notified the patient was admitted to the hospital on 01/10/2018 for Left leg cellulitis and left metatarsal fracture.  Plan: RN Health Coach will close the case due to admission.  Lazaro Arms RN, BSN, [place] Direct Dial:  [phone]  Fax: [phone]

## 2018-01-11 NOTE — Progress Notes (Signed)
Family Medicine Teaching Service Daily Progress Note Intern Pager: [phone]  Patient name: [name] Medical record number: [id number] Date of birth: [birth year] Age: 84 y.o. Gender: female  Primary Care Provider: McDiarmid, Blane Ohara, MD Consultants: none Code Status: full  Pt Overview and Major Events to Date:  11/13- admitted  Assessment and Plan: [name] is a 84 y.o. female presenting with cellulitis and left metatarsal fracture. PMH is significant for HFpEF, GERD, A. Fib on eliquis, Hypercholesterolemia, memory loss, urge incontinence, history of myocardial infarction, sick sinus syndrome, COPD, and lumbar spinal stenosis.  Cellulitis: afebrile, WBC count 7.7. Area is tender to palpation and 1+ edema to knee. -cont IV cefazolin 2 g q12h -PT/OT - f/u B Cx - BMP, CBC am -Tylenol as needed mild to moderate pain; tramadol for mod to severe pain -Up with assistance -Vitals per protocol  Left foot pain secondary to fifth metatarsal fracture: Tib/fib XR negative without evidence of fracture or dislocation.  Left foot x-ray showing acute nondisplaced transverse fracture through the base of the left fifth metatarsal with mild soft tissue swelling of the dorsal aspect of the left foot. Called and updated patient's daughter. - solid bottom shoe -received Percocet 5-325 in ED  -Pain management: Tylenol as needed mild to moderate pain; tramadol for moderate to severe pain -Frequent pain assessment -Up with assistance  Atrial fibrillation- poorly controlled. HR 104 this morning. EKG showed Afib on admission. Home meds: amiodarone 200 mg daily Monday through Friday and Eliquis 2.5 mg BID -Continuous cardiac monitoring -Continue amiodarone and Eliquis -Vitals per protocol  COPD- chronic, well- controlled - Albuterol neb PRN - Pulmicort BID - DuoNeb PRN  HFpEF- urine output 800cc yesterday since admission -Continue Lasix 20 mg daily  -Vitals per protocol  H/o MI and sick  sinus syndrome with pacemaker- -Continue metoprolol and Lipitor daily -Nitroglycerin prn chest pain  GERD- home meds: omeprazole at home. -Protonix while inpatient   FEN/GI: Saline lock, heart healthy diet, Miralax Prophylaxis: continue home eliquis  Disposition: med surg  Subjective:  Overnight: admitted  Today: patient resting comfortably. Called to update daughter.   Objective: Temp:  [97.8 F (36.6 C)-98.4 F (36.9 C)] 98.3 F (36.8 C) (11/14 0700) Pulse Rate:  [85-104] 104 (11/14 0700) Resp:  [17-21] 18 (11/14 0700) BP: (118-137)/(55-109) 118/55 (11/14 0700) SpO2:  [92 %-99 %] 96 % (11/14 0700) Weight:  [74.8 kg] 74.8 kg (11/13 1418) Physical Exam: General: resting comfortably Cardiovascular: regular rate, no murmur appreciated Respiratory: CTAB, no effort since was sleeping Extremities: well perfused, 1+ bilateral edema. Unable to observe foot in sock 2/2 patient refusal  Laboratory: Recent Labs  Lab 01/10/18 1521 01/11/18 0130  WBC 7.9 7.7  HGB 11.9* 10.3*  HCT 39.3 35.4*  PLT 197 196   Recent Labs  Lab 01/10/18 1521 01/11/18 0130  NA 137 139  K 3.8 4.0  CL 98 104  CO2 26 25  BUN 35* 35*  CREATININE 1.76* 1.69*  CALCIUM 9.0 8.5*  PROT 6.6  --   BILITOT 1.2  --   ALKPHOS 60  --   ALT 24  --   AST 30  --   GLUCOSE 108* 126*    Imaging/Diagnostic Tests: Dg Tibia/fibula Left  Result Date: 01/10/2018 CLINICAL DATA:  Initial evaluation for acute pain for 1 week, no injury. EXAM: LEFT TIBIA AND FIBULA - 2 VIEW COMPARISON:  Prior radiograph of the left foot from 11/29/2017. FINDINGS: No acute fracture or dislocation about the left tibia/fibula.  Left total knee arthroplasty in place without obvious complication. Bones are osteopenic. Mild soft tissue swelling anterior to the left ankle, improved from prior radiograph. There is a new acute transverse nondisplaced fracture through the base of the right fifth metatarsal. IMPRESSION: 1. Acute transverse  nondisplaced fracture through the base of the right fifth metatarsal. Finding better evaluated on concomitant radiograph of the left foot. 2. No acute fracture or dislocation about the left tibia/fibula. 3. Left knee arthroplasty in place without complication. Electronically Signed   By: Jeannine Boga M.D.   On: 01/10/2018 16:20   Dg Foot Complete Left  Result Date: 01/10/2018 CLINICAL DATA:  Initial evaluation for acute pain, no known injury, soft tissue swelling at the dorsal aspect of the foot. EXAM: LEFT FOOT - COMPLETE 3+ VIEW COMPARISON:  Prior radiograph from 11/29/2017. FINDINGS: Acute transverse nondisplaced fracture through the base of the left fifth metatarsal, new relative to recent radiograph. No other acute fracture or dislocation. Joint spaces maintained without evidence for significant degenerative or erosive arthropathy. Mild soft tissue swelling at the dorsal aspect of the foot. Tiny posterior and plantar calcaneal enthesophytes noted. Osteopenia. IMPRESSION: 1. Acute nondisplaced transverse fracture through the base of the left fifth metatarsal. 2. Mild soft tissue swelling at the dorsal aspect of the left foot. Electronically Signed   By: Jeannine Boga M.D.   On: 01/10/2018 16:22   Vas [place] Lower Extremity Venous (dvt) (only Mc & Wl)  Result Date: 01/10/2018  Lower Venous Study Indications: Pain, Swelling, and Erythema.  Risk Factors: Trauma Broken left foot. Performing Technologist: Toma Copier RVS  Examination Guidelines: A complete evaluation includes B-mode imaging, spectral Doppler, color Doppler, and power Doppler as needed of all accessible portions of each vessel. Bilateral testing is considered an integral part of a complete examination. Limited examinations for reoccurring indications may be performed as noted.  Right Venous Findings: +---+---------------+---------+-----------+----------+-------+    CompressibilityPhasicitySpontaneityPropertiesSummary  +---+---------------+---------+-----------+----------+-------+ CFVFull           Yes      Yes                          +---+---------------+---------+-----------+----------+-------+ SFJFull                                                 +---+---------------+---------+-----------+----------+-------+  Left Venous Findings: +---------+---------------+---------+-----------+----------+-------+          CompressibilityPhasicitySpontaneityPropertiesSummary +---------+---------------+---------+-----------+----------+-------+ CFV      Full           Yes      Yes                          +---------+---------------+---------+-----------+----------+-------+ SFJ      Full                                                 +---------+---------------+---------+-----------+----------+-------+ FV Prox  Full           Yes      Yes                          +---------+---------------+---------+-----------+----------+-------+ FV Mid   Full                                                 +---------+---------------+---------+-----------+----------+-------+  FV DistalFull           Yes      Yes                          +---------+---------------+---------+-----------+----------+-------+ PFV      Full           Yes      Yes                          +---------+---------------+---------+-----------+----------+-------+ POP      Full           Yes      Yes                          +---------+---------------+---------+-----------+----------+-------+ PTV      Full                                                 +---------+---------------+---------+-----------+----------+-------+ PERO     Full                                                 +---------+---------------+---------+-----------+----------+-------+    Summary: Right: There is no evidence of a common femoral vein obstruction. Left: There is no evidence of deep vein thrombosis in the lower extremity. No cystic  structure found in the popliteal fossa.  *See table(s) above for measurements and observations. Electronically signed by Harold Barban MD on 01/10/2018 at 7:14:32 PM.    Final      Richarda Osmond, DO 01/11/2018, 7:47 AM PGY-1, [facility] Intern pager: [phone], text pages welcome

## 2018-01-11 NOTE — Progress Notes (Signed)
Orthopedic Tech Progress Note Patient Details:  [name] 25-Nov-1923 [id number]  Ortho Devices Type of Ortho Device: Postop shoe/boot Ortho Device/Splint Interventions: Application   Post Interventions Patient Tolerated: Well Instructions Provided: Adjustment of device, Care of device   [name] 01/11/2018, 8:59 PM

## 2018-01-11 NOTE — Evaluation (Signed)
Occupational Therapy Evaluation Patient Details Name: [name] MRN: [id number] DOB: October 28, 1923 Today's Date: 01/11/2018    History of Present Illness Patient is a 84 y/o female presenting to the ED with priamry complaints of L foot pain. Tib/fib and left foot x-ray showing acute nondisplaced transverse fracture through the base of the left fifth metatarsal with mild soft tissue swelling of the dorsal aspect of the left foot. DVT ruled out with negative doppler. PMH significant for HFpEF, GERD, A. Fib on eliquis, Hypercholesterolemia, memory loss, urge incontinence, history of myocardial infarction, sick sinus syndrome, COPD, and lumbar spinal stenosis.   Clinical Impression   PTA Pt mod I for ADL/IADL. Pt able to progress from Mod A +2 safety for transfers to min guard assist for transfers with cues for positioning, Pt able to tolerate ambulation, tub transfer in ortho gym with TUB BENCH. It is imperative for safety and function that Pt has a tub bench for tub transfer currently, she is unable to access the tub stepping over tub. Pt will benefit from skilled OT in the acute setting as well as afterwards at the [facility] level to address ADL AND IADL as Pt was performing those PTA. Next session to focus on activity tolerance in standing and bed mobility (not practiced today)    Follow Up Recommendations  Home health OT;Supervision/Assistance - 24 hour(initially)    Equipment Recommendations  Tub/shower bench    Recommendations for Other Services       Precautions / Restrictions Precautions Precautions: Fall Restrictions Weight Bearing Restrictions: No      Mobility Bed Mobility               General bed mobility comments: up in recliner  Transfers Overall transfer level: Needs assistance Equipment used: Rolling walker (2 wheeled) Transfers: Sit to/from Stand Sit to Stand: Mod assist;Min assist         General transfer comment: first attempt with Mod A to power up from  recliner, 2nd attempt light Min A with cueing prior to transfer for positioning    Balance Overall balance assessment: Needs assistance Sitting-balance support: No upper extremity supported;Feet supported Sitting balance-Leahy Scale: Good     Standing balance support: Bilateral upper extremity supported;During functional activity Standing balance-Leahy Scale: Poor Standing balance comment: reliant on RW for ambulation                           ADL either performed or assessed with clinical judgement   ADL Overall ADL's : Needs assistance/impaired Eating/Feeding: Independent   Grooming: Min guard;Standing   Upper Body Bathing: Supervision/ safety;Sitting   Lower Body Bathing: Supervison/ safety;Sitting/lateral leans Lower Body Bathing Details (indicate cue type and reason): able to reach down to feet without problem from seated position Upper Body Dressing : Set up;Sitting   Lower Body Dressing: Set up;Sitting/lateral leans Lower Body Dressing Details (indicate cue type and reason): able to doff/don socks without assist Toilet Transfer: Moderate assistance;+2 for safety/equipment;Ambulation;RW Toilet Transfer Details (indicate cue type and reason): initially mod A for boost up, able to progress to min guard assist +2 for chair follow for safety Toileting- Clothing Manipulation and Hygiene: Supervision/safety;Sitting/lateral lean   Tub/ Shower Transfer: Tub transfer;Minimal assistance;Tub bench;Rolling walker Tub/Shower Transfer Details (indicate cue type and reason): educated on tub bench transfer, unable to perform without tub bench at this time Functional mobility during ADLs: Min guard;Minimal assistance;Rolling walker       Vision  Perception     Praxis      Pertinent Vitals/Pain Pain Assessment: Faces Faces Pain Scale: Hurts a little bit Pain Location: L foot Pain Descriptors / Indicators: Aching;Sore Pain Intervention(s): Monitored during  session;Repositioned     Hand Dominance Right   Extremity/Trunk Assessment Upper Extremity Assessment Upper Extremity Assessment: Overall WFL for tasks assessed;Generalized weakness   Lower Extremity Assessment Lower Extremity Assessment: Defer to PT evaluation LLE Deficits / Details: weight bearing through L heel only - some pain with active PF/DF LLE Sensation: WNL LLE Coordination: WNL   Cervical / Trunk Assessment Cervical / Trunk Assessment: Kyphotic   Communication Communication Communication: No difficulties   Cognition Arousal/Alertness: Awake/alert Behavior During Therapy: WFL for tasks assessed/performed Overall Cognitive Status: Within Functional Limits for tasks assessed                                     General Comments  very motivated and pleasant - great sense of humor    Exercises     Shoulder Instructions      Home Living Family/patient expects to be discharged to:: Private residence Living Arrangements: Children(adult daughter lives with her) Available Help at Discharge: Family;Available PRN/intermittently(daughter is about to ) Type of Home: House Home Access: Stairs to enter [employer] of Steps: 2 Entrance Stairs-Rails: Right Home Layout: Two level;Able to live on main level with bedroom/bathroom Alternate Level Stairs-Number of Steps: 12 Alternate Level Stairs-Rails: Right Bathroom Shower/Tub: Teacher, early years/pre: Standard     Home Equipment: Environmental consultant - 4 wheels;Bedside commode          Prior Functioning/Environment Level of Independence: Independent with assistive device(s)        Comments: enjoys going to the beach        OT Problem List: Decreased activity tolerance;Impaired balance (sitting and/or standing);Decreased knowledge of use of DME or AE;Pain      OT Treatment/Interventions: Self-care/ADL training;DME and/or AE instruction;Energy conservation;Therapeutic  activities;Patient/family education;Balance training    OT Goals(Current goals can be found in the care plan section) Acute Rehab OT Goals Patient Stated Goal: return home, vacation at the beach OT Goal Formulation: With patient Time For Goal Achievement: 01/25/18 Potential to Achieve Goals: Good ADL Goals Pt Will Perform Grooming: standing;with supervision Pt Will Perform Lower Body Bathing: with modified independence;sit to/from stand Pt Will Perform Lower Body Dressing: with modified independence;sit to/from stand Pt Will Transfer to Toilet: with modified independence;ambulating Pt Will Perform Toileting - Clothing Manipulation and hygiene: with modified independence;sit to/from stand Pt Will Perform Tub/Shower Transfer: with modified independence;tub bench  OT Frequency: Min 2X/week   Barriers to D/C:            Co-evaluation PT/OT/SLP Co-Evaluation/Treatment: Yes Reason for Co-Treatment: For patient/therapist safety;To address functional/ADL transfers PT goals addressed during session: Mobility/safety with mobility;Balance;Proper use of DME OT goals addressed during session: ADL's and self-care;Proper use of Adaptive equipment and DME      AM-PAC PT "6 Clicks" Daily Activity     Outcome Measure Help from another person eating meals?: None Help from another person taking care of personal grooming?: None(in sitting) Help from another person toileting, which includes using toliet, bedpan, or urinal?: A Little Help from another person bathing (including washing, rinsing, drying)?: A Little Help from another person to put on and taking off regular upper body clothing?: None Help from another person to put on and taking  off regular lower body clothing?: None 6 Click Score: 22   End of Session Equipment Utilized During Treatment: Gait belt;Rolling walker Nurse Communication: Mobility status  Activity Tolerance: Patient tolerated treatment well Patient left: in chair;with call  bell/phone within reach;with nursing/sitter in room;with chair alarm set  OT Visit Diagnosis: Unsteadiness on feet (R26.81);Other abnormalities of gait and mobility (R26.89);Pain Pain - Right/Left: Left Pain - part of body: Ankle and joints of foot                Time: 9923-4144 OT Time Calculation (min): 40 min Charges:  OT General Charges $OT Visit: 1 Visit OT Evaluation $OT Eval Moderate Complexity: [place] OTR/L Acute Rehabilitation Services Pager: [phone] Office: [place] 01/11/2018, 2:48 PM

## 2018-01-11 NOTE — Evaluation (Signed)
Physical Therapy Evaluation Patient Details Name: [name] MRN: [id number] DOB: 1923/11/27 Today's Date: 01/11/2018   History of Present Illness  Patient is a 84 y/o female presenting to the ED with priamry complaints of L foot pain. Tib/fib and left foot x-ray showing acute nondisplaced transverse fracture through the base of the left fifth metatarsal with mild soft tissue swelling of the dorsal aspect of the left foot. DVT ruled out with negative doppler. PMH significant for HFpEF, GERD, A. Fib on eliquis, Hypercholesterolemia, memory loss, urge incontinence, history of myocardial infarction, sick sinus syndrome, COPD, and lumbar spinal stenosis.    Clinical Impression  [name] is a very pleasant 84 y/o female admitted with the above listed diagnosis. Patient reports that prior to admission she was Mod I with all mobility and ADLs without use of AD. Patient today initially requiring Mod A to stand form recliner, with ability to progress to light Min A with cueing for positioning. Gait/stair training today with RW with required Min A for safety and stability at stairs, otherwise no LOB or overt instability. Patient very motivated to continue to progress functional mobility. Will currently recommend HHPT at discharge with PT to continue to follow acutely.     Follow Up Recommendations Home health PT;Supervision/Assistance - 24 hour    Equipment Recommendations  Rolling walker with 5" wheels    Recommendations for Other Services OT consult     Precautions / Restrictions Precautions Precautions: Fall Restrictions Weight Bearing Restrictions: No      Mobility  Bed Mobility               General bed mobility comments: up in recliner  Transfers Overall transfer level: Needs assistance Equipment used: Rolling walker (2 wheeled) Transfers: Sit to/from Stand Sit to Stand: Mod assist;Min assist         General transfer comment: first attempt with Mod A to power up from  recliner, 2nd attempt light Min A with cueing prior to transfer for positioning  Ambulation/Gait Ambulation/Gait assistance: Min assist;Min guard Gait Distance (Feet): 50 Feet Assistive device: Rolling walker (2 wheeled) Gait Pattern/deviations: Step-to pattern;Step-through pattern;Decreased stride length;Decreased dorsiflexion - left;Decreased weight shift to left;Antalgic Gait velocity: decreased   General Gait Details: weight bearing through L heel only; light Min A for safety and RW management  Stairs Stairs: Yes Stairs assistance: Min assist;Min guard Stair Management: One rail Left;Step to pattern;Sideways Number of Stairs: 2 General stair comments: navigating stiars sideways to mimick single handrail at home; patient with Min A to power up each step; no LOB  Wheelchair Mobility    Modified Rankin (Stroke Patients Only)       Balance Overall balance assessment: Needs assistance Sitting-balance support: No upper extremity supported;Feet supported Sitting balance-Leahy Scale: Good     Standing balance support: Bilateral upper extremity supported;During functional activity Standing balance-Leahy Scale: Poor Standing balance comment: reliant on RW for ambulation                             Pertinent Vitals/Pain Pain Assessment: Faces Faces Pain Scale: Hurts a little bit Pain Location: L foot Pain Descriptors / Indicators: Aching;Sore Pain Intervention(s): Limited activity within patient's tolerance;Monitored during session    [place] expects to be discharged to:: Private residence Living Arrangements: Children(adult daughter lives with her) Available Help at Discharge: Family;Available PRN/intermittently(daughter is about to ) Type of Home: House Home Access: Stairs to enter Entrance Stairs-Rails: Right Entrance  Stairs-Number of Steps: 2 Home Layout: Two level;Able to live on main level with bedroom/bathroom Home Equipment: Walker -  4 wheels;Bedside commode      Prior Function Level of Independence: Independent with assistive device(s)               Hand Dominance   Dominant Hand: Right    Extremity/Trunk Assessment   Upper Extremity Assessment Upper Extremity Assessment: Defer to OT evaluation    Lower Extremity Assessment Lower Extremity Assessment: Generalized weakness;LLE deficits/detail LLE Deficits / Details: weighrt bearing through L heel only - some pain with active PF/DF LLE Sensation: WNL LLE Coordination: WNL    Cervical / Trunk Assessment Cervical / Trunk Assessment: Kyphotic  Communication   Communication: No difficulties  Cognition Arousal/Alertness: Awake/alert Behavior During Therapy: WFL for tasks assessed/performed Overall Cognitive Status: Within Functional Limits for tasks assessed                                        General Comments General comments (skin integrity, edema, etc.): very motivated and pleasant    Exercises     Assessment/Plan    PT Assessment Patient needs continued PT services  PT Problem List Decreased strength;Decreased range of motion;Decreased activity tolerance;Decreased balance;Decreased mobility;Decreased knowledge of use of DME;Decreased safety awareness       PT Treatment Interventions DME instruction;Gait training;Stair training;Functional mobility training;Therapeutic activities;Therapeutic exercise;Balance training;Patient/family education    PT Goals (Current goals can be found in the Care Plan section)  Acute Rehab PT Goals Patient Stated Goal: return home PT Goal Formulation: With patient Time For Goal Achievement: 01/25/18 Potential to Achieve Goals: Good    Frequency Min 3X/week   Barriers to discharge        Co-evaluation PT/OT/SLP Co-Evaluation/Treatment: Yes Reason for Co-Treatment: For patient/therapist safety;To address functional/ADL transfers PT goals addressed during session: Mobility/safety with  mobility;Balance;Proper use of DME         AM-PAC PT "6 Clicks" Daily Activity  Outcome Measure Difficulty turning over in bed (including adjusting bedclothes, sheets and blankets)?: A Little Difficulty moving from lying on back to sitting on the side of the bed? : A Little Difficulty sitting down on and standing up from a chair with arms (e.g., wheelchair, bedside commode, etc,.)?: Unable Help needed moving to and from a bed to chair (including a wheelchair)?: A Little Help needed walking in hospital room?: A Little Help needed climbing 3-5 steps with a railing? : A Little 6 Click Score: 16    End of Session Equipment Utilized During Treatment: Gait belt Activity Tolerance: Patient tolerated treatment well Patient left: in chair;with call bell/phone within reach;with nursing/sitter in room Nurse Communication: Mobility status PT Visit Diagnosis: Unsteadiness on feet (R26.81);Other abnormalities of gait and mobility (R26.89);Muscle weakness (generalized) (M62.81)    Time: 4975-3005 PT Time Calculation (min) (ACUTE ONLY): 40 min   Charges:   PT Evaluation $PT Eval Moderate Complexity: 1 Mod PT Treatments $Gait Training: 8-22 mins      Lanney Gins, PT, DPT Supplemental Physical Therapist 01/11/18 2:34 PM Pager: [phone] Office: [phone]

## 2018-01-12 DIAGNOSIS — L03116 Cellulitis of left lower limb: Principal | ICD-10-CM

## 2018-01-12 DIAGNOSIS — N183 Chronic kidney disease, stage 3 (moderate): Secondary | ICD-10-CM

## 2018-01-12 LAB — BASIC METABOLIC PANEL
ANION GAP: 8 (ref 5–15)
BUN: 36 mg/dL — AB (ref 8–23)
CALCIUM: 8.7 mg/dL — AB (ref 8.9–10.3)
CO2: 27 mmol/L (ref 22–32)
Chloride: 103 mmol/L (ref 98–111)
Creatinine, Ser: 1.94 mg/dL — ABNORMAL HIGH (ref 0.44–1.00)
GFR calc Af Amer: 25 mL/min — ABNORMAL LOW (ref >60)
GFR calc non Af Amer: 21 mL/min — ABNORMAL LOW (ref >60)
GLUCOSE: 99 mg/dL (ref 70–99)
Potassium: 4 mmol/L (ref 3.5–5.1)
Sodium: 138 mmol/L (ref 135–145)

## 2018-01-12 LAB — CBC
HCT: 33.9 % — ABNORMAL LOW (ref 36.0–46.0)
Hemoglobin: 10.4 g/dL — ABNORMAL LOW (ref 12.0–15.0)
MCH: 29 pg (ref 26.0–34.0)
MCHC: 30.7 g/dL (ref 30.0–36.0)
MCV: 94.4 fL (ref 80.0–100.0)
PLATELETS: 182 10*3/uL (ref 150–400)
RBC: 3.59 MIL/uL — ABNORMAL LOW (ref 3.87–5.11)
RDW: 13.5 % (ref 11.5–15.5)
WBC: 5.7 10*3/uL (ref 4.0–10.5)
nRBC: 0 % (ref 0.0–0.2)

## 2018-01-12 MED ORDER — TRAMADOL HCL 50 MG PO TABS
50.0000 mg | ORAL_TABLET | Freq: Two times a day (BID) | ORAL | Status: DC | PRN
  Administered 2018-01-14: 50 mg via ORAL
  Filled 2018-01-12: qty 1

## 2018-01-12 MED ORDER — CEPHALEXIN 250 MG PO CAPS
250.0000 mg | ORAL_CAPSULE | Freq: Two times a day (BID) | ORAL | Status: AC
  Administered 2018-01-12 – 2018-01-14 (×5): 250 mg via ORAL
  Filled 2018-01-12 (×5): qty 1

## 2018-01-12 NOTE — Care Management Important Message (Signed)
Important Message  Patient Details  Name: [name] MRN: [id number] Date of Birth: 01/21/1924   Medicare Important Message Given:  Yes    Orbie Pyo 01/12/2018, 4:14 PM

## 2018-01-12 NOTE — Consult Note (Signed)
   [facility] CM Inpatient Consult   01/12/2018  JEMYA DEPIERRO 1924/01/25 [id number]    Mrs. Doto was active with [facility] prior to hospitalization. Went to bedside to speak with Mrs. Pelto and her daughter [name] about ongoing [facility] services. Both patient and daughter endorse the discharge plan is for SNF. Discussed that writer will make appropriate [facility] referral once disposition is known.   Left [facility] Care Management brochure with contact information at bedside.   Notification sent to (covering) inpatient RNCM to make aware [facility] will follow post hospital discharge.    Marthenia Rolling, MSN-Ed, RN,BSN [facility] Liaison [phone]

## 2018-01-12 NOTE — Clinical Social Work Note (Signed)
Clinical Social Work Assessment  Patient Details  Name: [name] MRN: [id number] Date of Birth: 1923/07/10  Date of referral:  01/10/18               Reason for consult:  Facility Placement                Permission sought to share information with:  Facility Sport and exercise psychologist, Family Supports, Case Manager Permission granted to share information::  Yes, Verbal Permission Granted  Name::     Landscape architect::  SNFs  Relationship::  daughter  Contact Information:  [phone]  Housing/Transportation Living arrangements for the past 2 months:  Single Family Home Source of Information:  Patient, Adult Children Patient Interpreter Needed:  None Criminal Activity/Legal Involvement Pertinent to Current Situation/Hospitalization:  No - Comment as needed Significant Relationships:  Adult Children Lives with:  Adult Children(Lives with one daughter, daughter flight attendant) Do you feel safe going back to the place where you live?  No Need for family participation in patient care:  Yes (Comment)  Care giving concerns:  CSW received referral for possible SNF placement at time of discharge. Spoke with patient regarding possibility of SNF placement . Patient's  Family and daughter [name] [facility])  is currently unable to care for her at their home given patient's current needs and fall risk.  Patient and daughter   expressed understanding of PT recommendation and are agreeable to SNF placement at time of discharge. CSW to continue to follow and assist with discharge planning needs.     Social Worker assessment / plan:  Spoke with patient and daughter [name]) POA  concerning possibility of rehab at [facility] before returning home.   Employment status:  Retired Health visitor PT Recommendations:  Home with [employer], [place] / Referral to community resources:  [place]  Patient/Family's Response to care:  Patient and   family   recognize need for rehab before returning home and are agreeable to a SNF in [place]. They report preference for    [facility]   . CSW explained insurance authorization process. Patient's family reported that they want patient to get stronger to be able to come back home.    Patient/Family's Understanding of and Emotional Response to Diagnosis, Current Treatment, and Prognosis: Patient/family is realistic regarding therapy needs and expressed being hopeful for SNF placement. Patient expressed understanding of CSW role and discharge process as well as medical condition. No questions/concerns about plan or treatment.    Emotional Assessment Appearance:  Appears stated age Attitude/Demeanor/Rapport:    Affect (typically observed):  Accepting, Adaptable, Appropriate, Pleasant Orientation:  Oriented to Self, Oriented to Place, Oriented to  Time, Oriented to Situation Alcohol / Substance use:  Not Applicable Psych involvement (Current and /or in the community):  No (Comment)  Discharge Needs  Concerns to be addressed:  Home Safety Concerns, Discharge Planning Concerns Readmission within the last 30 days:  No Current discharge risk:  Chronically ill, Dependent with Mobility Barriers to Discharge:  Continued Medical Work up   [employer], LCSW 01/12/2018, 3:55 PM

## 2018-01-12 NOTE — Progress Notes (Signed)
Family Medicine Teaching Service Daily Progress Note Intern Pager: [phone]  Patient name: [name] Medical record number: [id number] Date of birth: Aug 23, 1923 Age: 84 y.o. Gender: female  Primary Care Provider: McDiarmid, Blane Ohara, MD Consultants: none Code Status: full  Pt Overview and Major Events to Date:  11/13- admitted  Assessment and Plan: [name] is a 84 y.o. female presenting with cellulitis and left metatarsal fracture. PMH is significant for HFpEF, GERD, A. Fib on eliquis, Hypercholesterolemia, memory loss, urge incontinence, history of myocardial infarction, sick sinus syndrome, COPD, and lumbar spinal stenosis.  L foot Cellulitis and fifth metatarsal Fx: afebrile, WBC count 5.7. Remains afebrile. Blood cx negative x2 days.Left foot x-ray showing acute nondisplaced transverse fracture through the base of the left fifth metatarsal with mild soft tissue swelling of the dorsal aspect of the left foot. Patient states she is non-tender and she is able to gingerly walk on the foot. Is adherent with wearing the hard bottom shoe. There is no erythema or temperature increase visible outside of the shoe.  - transition IV cefazolin 2 g q12h to Keflex 500mg  q6hr end date 11/17. -PT/OT - CBC am -Tylenol as needed mild to moderate pain; tramadol for mod to severe pain -Up with assistance - solid bottom shoe -Vitals per protocol - continue to follow up with CSW for rehab placement- patient prefers [facility] as she liked it when she went several years ago.  Atrial fibrillation- moderately controlled. HR 89-104 overnight. EKG showed Afib on admission. Home meds: amiodarone 200 mg daily Monday through Friday and Eliquis 2.5 mg BID -Continuous cardiac monitoring -Continue amiodarone and Eliquis -Vitals per protocol  COPD- chronic, well- controlled - Albuterol neb PRN - Pulmicort BID - DuoNeb PRN  HFpEF- urine output almost 2 liters yesterday -Continue Lasix 20 mg daily   -Vitals per protocol  H/o MI and sick sinus syndrome with pacemaker- -Continue metoprolol and Lipitor daily -Nitroglycerin prn chest pain  CKDIII- patient Cr increased to 1.94 today. GFR 21. Has good urine output - avoid nephrotoxic agents  GERD- home meds: omeprazole at home. -Protonix while inpatient   Updated daughter [name]) yesterday. She called the nurse today and requested her mother get a urinalysis. Patient denies any urinary symptoms. I will forgo the repeat urinalysis at this time.   FEN/GI: Saline lock, heart healthy diet, Miralax Prophylaxis: continue home eliquis  Disposition: med surg  Subjective:  Overnight: no acute events. Patient states she did not sleep well. She just had a lot of things on her mind.  Today: patient restign comfortably in chair eating breakfast. She does not complain of any foot pain. She wants to be included in the decisions for her care and placement in rehab.   Objective: Temp:  [97.8 F (36.6 C)-98.4 F (36.9 C)] 98.1 F (36.7 C) (11/15 0351) Pulse Rate:  [89-104] 102 (11/15 0351) Resp:  [14-19] 14 (11/15 0351) BP: (108-129)/(60-92) 129/75 (11/15 0351) SpO2:  [95 %-98 %] 95 % (11/15 0351) Physical Exam: General: resting comfortably- ate most of breakfast Cardiovascular: regular rate, no murmur appreciated Respiratory: CTAB, no effort since was sleeping Extremities: well perfused, 1+ bilateral edema. Left foot in hard bottom shoe  Laboratory: Recent Labs  Lab 01/10/18 1521 01/11/18 0130  WBC 7.9 7.7  HGB 11.9* 10.3*  HCT 39.3 35.4*  PLT 197 196   Recent Labs  Lab 01/10/18 1521 01/11/18 0130  NA 137 139  K 3.8 4.0  CL 98 104  CO2 26 25  BUN 35* 35*  CREATININE 1.76* 1.69*  CALCIUM 9.0 8.5*  PROT 6.6  --   BILITOT 1.2  --   ALKPHOS 60  --   ALT 24  --   AST 30  --   GLUCOSE 108* 126*    Imaging/Diagnostic Tests: Dg Tibia/fibula Left  Result Date: 01/10/2018 CLINICAL DATA:  Initial evaluation for acute  pain for 1 week, no injury. EXAM: LEFT TIBIA AND FIBULA - 2 VIEW COMPARISON:  Prior radiograph of the left foot from 11/29/2017. FINDINGS: No acute fracture or dislocation about the left tibia/fibula. Left total knee arthroplasty in place without obvious complication. Bones are osteopenic. Mild soft tissue swelling anterior to the left ankle, improved from prior radiograph. There is a new acute transverse nondisplaced fracture through the base of the right fifth metatarsal. IMPRESSION: 1. Acute transverse nondisplaced fracture through the base of the right fifth metatarsal. Finding better evaluated on concomitant radiograph of the left foot. 2. No acute fracture or dislocation about the left tibia/fibula. 3. Left knee arthroplasty in place without complication. Electronically Signed   By: Jeannine Boga M.D.   On: 01/10/2018 16:20   Dg Foot Complete Left  Result Date: 01/10/2018 CLINICAL DATA:  Initial evaluation for acute pain, no known injury, soft tissue swelling at the dorsal aspect of the foot. EXAM: LEFT FOOT - COMPLETE 3+ VIEW COMPARISON:  Prior radiograph from 11/29/2017. FINDINGS: Acute transverse nondisplaced fracture through the base of the left fifth metatarsal, new relative to recent radiograph. No other acute fracture or dislocation. Joint spaces maintained without evidence for significant degenerative or erosive arthropathy. Mild soft tissue swelling at the dorsal aspect of the foot. Tiny posterior and plantar calcaneal enthesophytes noted. Osteopenia. IMPRESSION: 1. Acute nondisplaced transverse fracture through the base of the left fifth metatarsal. 2. Mild soft tissue swelling at the dorsal aspect of the left foot. Electronically Signed   By: Jeannine Boga M.D.   On: 01/10/2018 16:22   Vas [place] Lower Extremity Venous (dvt) (only [facility])  Result Date: 01/10/2018  Lower Venous Study Indications: Pain, Swelling, and Erythema.  Risk Factors: Trauma Broken left foot. Performing  Technologist: Toma Copier RVS  Examination Guidelines: A complete evaluation includes B-mode imaging, spectral Doppler, color Doppler, and power Doppler as needed of all accessible portions of each vessel. Bilateral testing is considered an integral part of a complete examination. Limited examinations for reoccurring indications may be performed as noted.  Right Venous Findings: +---+---------------+---------+-----------+----------+-------+    CompressibilityPhasicitySpontaneityPropertiesSummary +---+---------------+---------+-----------+----------+-------+ CFVFull           Yes      Yes                          +---+---------------+---------+-----------+----------+-------+ SFJFull                                                 +---+---------------+---------+-----------+----------+-------+  Left Venous Findings: +---------+---------------+---------+-----------+----------+-------+          CompressibilityPhasicitySpontaneityPropertiesSummary +---------+---------------+---------+-----------+----------+-------+ CFV      Full           Yes      Yes                          +---------+---------------+---------+-----------+----------+-------+ SFJ      Full                                                 +---------+---------------+---------+-----------+----------+-------+  FV Prox  Full           Yes      Yes                          +---------+---------------+---------+-----------+----------+-------+ FV Mid   Full                                                 +---------+---------------+---------+-----------+----------+-------+ FV DistalFull           Yes      Yes                          +---------+---------------+---------+-----------+----------+-------+ PFV      Full           Yes      Yes                          +---------+---------------+---------+-----------+----------+-------+ POP      Full           Yes      Yes                           +---------+---------------+---------+-----------+----------+-------+ PTV      Full                                                 +---------+---------------+---------+-----------+----------+-------+ PERO     Full                                                 +---------+---------------+---------+-----------+----------+-------+    Summary: Right: There is no evidence of a common femoral vein obstruction. Left: There is no evidence of deep vein thrombosis in the lower extremity. No cystic structure found in the popliteal fossa.  *See table(s) above for measurements and observations. Electronically signed by Harold Barban MD on 01/10/2018 at 90:14:32 PM.    Final     Richarda Osmond, DO 01/12/2018, 7:02 AM PGY-1, [place] Intern pager: [phone], text pages welcome

## 2018-01-12 NOTE — NC FL2 (Signed)
[place] LEVEL OF CARE SCREENING TOOL     IDENTIFICATION  Patient Name: [name] Birthdate: [birth year] Sex: female Admission Date (Current Location): 01/10/2018  [facility] and [place] Number:  Herbalist and Address:  [facility] [place], [place] [address] [place], [place] [address]      Provider Number: [id number]  Attending Physician Name and Address:  No att. providers found  Relative Name and Phone Number:  [name] (daughter) POA    Current Level of Care: Hospital Recommended Level of Care: [place] Prior Approval Number:    Date Approved/Denied:   PASRR Number: [id number]  Discharge Plan: SNF    Current Diagnoses: Patient Active Problem List   Diagnosis Date Noted  . Atrial fibrillation, permanent   . Cellulitis 01/10/2018  . Wound of left foot 12/29/2017    Class: Acute  . Traumatic ecchymosis of left foot 12/07/2017  . Fracture of 5th metatarsal, Right, avulsion fx of tip styloid 11/09/2017  . Laceration of arm, unspecified laterality, initial encounter 11/03/2017  . Acute on chronic diastolic heart failure ([place]) 10/16/2017  . Venous insufficiency of both lower extremities 09/21/2017  . Impaired mobility and ADLs 09/01/2017  . Other fatigue 06/30/2017  . Bilateral leg edema 01/10/2017  . Fall 10/27/2016  . Chronic diastolic CHF (congestive heart failure) ([place]) 11/08/2015  . GERD (gastroesophageal reflux disease) 11/08/2015  . On amiodarone therapy 09/14/2015  . Paroxysmal atrial fibrillation (HCC)   . History of iron deficiency 01/16/2015  . Venous stasis ulcer of ankle, left ([place]) 10/24/2014  . Pure hypercholesterolemia 07/31/2014  . CAD S/P RCA BMS, residual CFX disease 06/08/2014  . Persistent atrial fibrillation ([place]) 05/31/2014  . Pacemaker 04/02/2013  . Cognitive impairment 10/26/2012  . Chronic anticoagulation 10/01/2012  . Urge incontinence 12/13/2011  . Vitamin D deficiency 11/02/2010   . Macular degeneration, bilateral 10/04/2010    Class: Chronic  . Iron deficiency anemia due to chronic blood loss 08/05/2010  . Solitary kidney, acquired 05/17/2010  . Spondylolisthesis of lumbar region   . VITAMIN B12 DEFICIENCY 10/07/2009  . Chronic kidney disease (CKD), stage IV (severe) ([place]) 09/02/2009  . MYOCARDIAL INFARCTION, HX OF 09/25/2008  . VENTRICULAR HYPERTROPHY, LEFT 08/28/2008  . At risk for diabetes mellitus 09/11/2007  . SICK SINUS SYNDROME 04/27/2006  . Allergic rhinitis, seasonal 04/27/2006  . COPD, severe ([place]) 04/27/2006  . GASTROESOPHAGEAL REFLUX, NO ESOPHAGITIS 04/27/2006  . Osteoarthrosis involving lower leg 04/27/2006  . LUMBAR SPINAL STENOSIS 04/27/2006    Orientation RESPIRATION BLADDER Height & Weight     Self, Time, Situation, Place  Normal Continent Weight: 165 lb (74.8 kg) Height:  5\' 7"  (170.2 cm)  BEHAVIORAL SYMPTOMS/MOOD NEUROLOGICAL BOWEL NUTRITION STATUS      Continent Diet(see discharge summary)  AMBULATORY STATUS COMMUNICATION OF NEEDS Skin   Limited Assist Verbally Skin abrasions, Surgical wounds(cellulitis and abrasions left foot, left foot open/dehisced wound)                       Personal Care Assistance Level of Assistance  Bathing, Feeding, Dressing, Total care Bathing Assistance: Limited assistance Feeding assistance: Independent Dressing Assistance: Limited assistance Total Care Assistance: Limited assistance   Functional Limitations Info  Sight, Hearing, Speech Sight Info: Adequate Hearing Info: Adequate Speech Info: Adequate    SPECIAL CARE FACTORS FREQUENCY  PT (By licensed PT), OT (By licensed OT)     PT Frequency: min 5x weekly OT Frequency: min 3x weekly  Contractures Contractures Info: Not present    Additional Factors Info  Code Status, Allergies Code Status Info: full Allergies Info: Allergies:  Baclofen, Codeine Phosphate, Hydrochlorothiazide, Montelukast Sodium, Norco  Hydrocodone-acetaminophen           Current Medications (01/12/2018):  This is the current hospital active medication list Current Facility-Administered Medications  Medication Dose Route Frequency Provider Last Rate Last Dose  . acetaminophen (TYLENOL) tablet 650 mg  650 mg Oral Q6H PRN Lovenia Kim, MD   650 mg at 01/12/18 2707   Or  . acetaminophen (TYLENOL) suppository 650 mg  650 mg Rectal Q6H PRN Lovenia Kim, MD      . albuterol (PROVENTIL) (2.5 MG/3ML) 0.083% nebulizer solution 2.5 mg  2.5 mg Nebulization Q6H PRN Lovenia Kim, MD   2.5 mg at 01/11/18 0037  . amiodarone (PACERONE) tablet 200 mg  200 mg Oral Once per day on Mon Tue Wed Thu Fri Amin, Yashika, MD   200 mg at 01/12/18 0800  . apixaban (ELIQUIS) tablet 2.5 mg  2.5 mg Oral BID Lovenia Kim, MD   2.5 mg at 01/12/18 0801  . atorvastatin (LIPITOR) tablet 20 mg  20 mg Oral Daily Lovenia Kim, MD   20 mg at 01/12/18 0800  . budesonide (PULMICORT) nebulizer solution 0.5 mg  0.5 mg Nebulization BID Lovenia Kim, MD   0.5 mg at 01/12/18 0743  . cephALEXin (KEFLEX) capsule 250 mg  250 mg Oral Q12H Winfrey, Alcario Drought, MD      . fluticasone (FLONASE) 50 MCG/ACT nasal spray 2 spray  2 spray Each Nare Daily Lovenia Kim, MD   2 spray at 01/12/18 0801  . furosemide (LASIX) tablet 20 mg  20 mg Oral Daily Lovenia Kim, MD   20 mg at 01/12/18 0800  . ipratropium-albuterol (DUONEB) 0.5-2.5 (3) MG/3ML nebulizer solution 3 mL  3 mL Nebulization Q6H PRN Lovenia Kim, MD      . metoprolol succinate (TOPROL-XL) 24 hr tablet 25 mg  25 mg Oral Daily Lovenia Kim, MD   25 mg at 01/12/18 0800  . nitroGLYCERIN (NITROSTAT) SL tablet 0.4 mg  0.4 mg Sublingual Q5 min PRN Lovenia Kim, MD      . oxyCODONE-acetaminophen (PERCOCET/ROXICET) 5-325 MG per tablet 0.5 tablet  0.5 tablet Oral Once Lovenia Kim, MD      . pantoprazole (PROTONIX) EC tablet 40 mg  40 mg Oral Daily Lovenia Kim, MD   40 mg at 01/12/18 0800  . polyethylene glycol (MIRALAX  / GLYCOLAX) packet 17 g  17 g Oral Daily PRN Lovenia Kim, MD      . traMADol Veatrice Bourbon) tablet 50 mg  50 mg Oral Q12H PRN Kathrene Alu, MD         Discharge Medications: Please see discharge summary for a list of discharge medications.  Relevant Imaging Results:  Relevant Lab Results:   Additional Information SSN: [id number]  Alberteen Sam, LCSW

## 2018-01-12 NOTE — Care Management Important Message (Signed)
Important Message  Patient Details  Name: [name] MRN: [id number] Date of Birth: Oct 28, 1923   Medicare Important Message Given:  Yes    Erenest Rasher, RN 01/12/2018, 2:08 PM

## 2018-01-12 NOTE — Progress Notes (Addendum)
Physical Therapy Treatment Patient Details Name: [name] MRN: [id number] DOB: [birth year] Today's Date: 01/12/2018    History of Present Illness Patient is a 84 y/o female presenting to the ED with priamry complaints of L foot pain. Tib/fib and left foot x-ray showing acute nondisplaced transverse fracture through the base of the left fifth metatarsal with mild soft tissue swelling of the dorsal aspect of the left foot. DVT ruled out with negative doppler. PMH significant for HFpEF, GERD, A. Fib on eliquis, Hypercholesterolemia, memory loss, urge incontinence, history of myocardial infarction, sick sinus syndrome, COPD, and lumbar spinal stenosis.    PT Comments    Patient seen for mobility progression. Pt is making progress toward PT goals and reports no increased L foot pain with mobility. Pt tolerated gait training for 70 ft with min guard/min A and RW prior to needed rest break. Pt with SOB while ambulating and SpO2 90% on RA. Pt requires min/mod A for sit to stand transfers. Given pt's current mobility level recommending SNF for further skilled PT services to maximize independence and safety with mobility prior to d/c home with intermittent supervision/assistance.   Follow Up Recommendations  SNF     Equipment Recommendations  None recommended by PT    Recommendations for Other Services       Precautions / Restrictions Precautions Precautions: Fall    Mobility  Bed Mobility               General bed mobility comments: up in recliner  Transfers Overall transfer level: Needs assistance Equipment used: Rolling walker (2 wheeled) Transfers: Sit to/from Stand Sit to Stand: Mod assist;Min assist         General transfer comment: assistance required to power up into standing X 3 trials from recliner   Ambulation/Gait Ambulation/Gait assistance: Min assist;Min guard Gait Distance (Feet): (70 ft X 2 trials with seated rest break) Assistive device: Rolling walker (2  wheeled) Gait Pattern/deviations: Step-through pattern;Trunk flexed Gait velocity: decreased   General Gait Details: cues for posture and breathing technique; pt with SOB while ambulating with SpO2 90% on RA   Stairs             Wheelchair Mobility    Modified Rankin (Stroke Patients Only)       Balance Overall balance assessment: Needs assistance Sitting-balance support: No upper extremity supported;Feet supported Sitting balance-Leahy Scale: Good     Standing balance support: Bilateral upper extremity supported;During functional activity Standing balance-Leahy Scale: Poor Standing balance comment: reliant on RW for ambulation                            Cognition Arousal/Alertness: Awake/alert Behavior During Therapy: WFL for tasks assessed/performed Overall Cognitive Status: Within Functional Limits for tasks assessed                                        Exercises      General Comments        Pertinent Vitals/Pain Pain Assessment: Faces Faces Pain Scale: Hurts a little bit Pain Location: L foot Pain Descriptors / Indicators: Discomfort;Guarding Pain Intervention(s): Monitored during session    Home Living                      Prior Function  PT Goals (current goals can now be found in the care plan section) Progress towards PT goals: Progressing toward goals    Frequency    Min 3X/week      PT Plan Discharge plan needs to be updated    Co-evaluation              AM-PAC PT "6 Clicks" Daily Activity  Outcome Measure  Difficulty turning over in bed (including adjusting bedclothes, sheets and blankets)?: A Little Difficulty moving from lying on back to sitting on the side of the bed? : A Little Difficulty sitting down on and standing up from a chair with arms (e.g., wheelchair, bedside commode, etc,.)?: Unable Help needed moving to and from a bed to chair (including a wheelchair)?: A  Little Help needed walking in hospital room?: A Little Help needed climbing 3-5 steps with a railing? : A Little 6 Click Score: 16    End of Session Equipment Utilized During Treatment: Gait belt Activity Tolerance: Patient tolerated treatment well Patient left: in chair;with call bell/phone within reach Nurse Communication: Mobility status PT Visit Diagnosis: Unsteadiness on feet (R26.81);Other abnormalities of gait and mobility (R26.89);Muscle weakness (generalized) (M62.81)     Time: 9458-5929 PT Time Calculation (min) (ACUTE ONLY): 20 min  Charges:  $Gait Training: 8-22 mins                     Earney Navy, PTA Acute Rehabilitation Services Pager: [phone] Office: [phone]     Darliss Cheney 01/12/2018, 12:56 PM

## 2018-01-12 NOTE — Discharge Summary (Signed)
[facility] Discharge Summary  Patient name: [name] Medical record number: [id number] Date of birth: [birth year] Age: 84 y.o. Gender: female Date of Admission: 01/10/2018  Date of Discharge: 01/15/2018 Admitting Physician: Alveda Reasons, MD  Primary Care Provider: McDiarmid, Blane Ohara, MD Consultants: PT/OT, CSW, WOC  Indication for Hospitalization: Left foot fracture and cellulitis  Discharge Diagnoses/Problem List:  L 5th metatarsal fx Cellulitis Afib COPD HFpEF Sick sinus syndrome with pacemaker CKD3 GERD  Disposition: SNF  Discharge Condition: improved  Discharge Exam:  Physical Exam  Constitutional: She appears well-nourished.  Frail, pleasant  Cardiovascular: Normal rate and normal heart sounds.  Irregularly irregular  Pulmonary/Chest: Effort normal. No respiratory distress.  Coarse breath sounds, stable  Abdominal: Soft.  Musculoskeletal: Normal range of motion. She exhibits edema (L 1+ pitting; R trace).  Stable Ecchymosis to plantar Left foot, dorsal wound clean and covered with foam dressing; hard-soled shoe in place  Psychiatric: She has a normal mood and affect.     Brief Hospital Course:  [name] is a 84 year old female admitted on 01/10/2018 for left foot and calf pain, found to be secondary to a non-displaced transverse left 5th metatarsal fracture, as well as cellulitis of the left lower extremity stemming from a dorsal left foot injury. Blood cultures were drawn and she was started on IV Cefazolin, which was later transitioned to PO Keflex through 11/17. The patient's white blood cell count stayed within normal range and the patient was afebrile throughout the admission. Blood cultures eventually returned without growth. Doppler ultrasound of the left lower extremity ruled out a DVT. Pain was managed with oxycodone and tramadol and she was also given a Tdap booster considering the open dorsal left foot lesion with  surrounding cellulitis. The patient was given a hard-sole shoe to assist with ambulation. PT/OT were consulted and recommended SNF placement, especially considering the patient's help at home from her daughters is dwindling due to busy work schedules.   The patient's creatinine level increased to as high as 2.25 on GFR decreased to the low 20s during the admission most likely due to lasix and poor PO intake. Nephrology was consulted and recommended a temporary hold on the IV Lasix, and her creatinine appropriately decreased to 1.69 (base line is ~1.6). Lasix 20mg  can be continued once she is outpatient with good PO intake.  The patient's chronic conditions, such as COPD, A. Fib, and HFpEF were appropriately managed with her home medications and she remained stable throughout her admission. She continued to improve and was medically cleared for discharge to SNF [facility] on 01/15/2018.  Issues for Follow Up:  1. Continued PT for future injury prevention. 2. Encourage adequate PO intake while on Lasix to prevent AKI. 3. Continue all home medications as prescribed.  Significant Procedures: none  Significant Labs and Imaging:  Recent Labs  Lab 01/11/18 0130 01/12/18 0710 01/13/18 0356  WBC 7.7 5.7 5.6  HGB 10.3* 10.4* 9.7*  HCT 35.4* 33.9* 32.1*  PLT 196 182 186   Recent Labs  Lab 01/10/18 1521 01/11/18 0130 01/12/18 0710 01/13/18 0356 01/14/18 0332 01/15/18 0328  NA 137 139 138 137 141 139  K 3.8 4.0 4.0 4.1 4.0 4.1  CL 98 104 103 104 105 109  CO2 26 25 27 28 28 25   GLUCOSE 108* 126* 99 117* 114* 113*  BUN 35* 35* 36* 38* 36* 31*  CREATININE 1.76* 1.69* 1.94* 2.06* 2.25* 1.69*  CALCIUM 9.0 8.5* 8.7* 8.4* 8.5* 8.4*  PHOS  --   --   --   --   --  3.4  ALKPHOS 60  --   --   --   --   --   AST 30  --   --   --   --   --   ALT 24  --   --   --   --   --   ALBUMIN 3.6  --   --   --   --  2.6*    Results/Tests Pending at Time of Discharge: none  Discharge Medications:   Allergies as of 01/15/2018      Reactions   Baclofen Other (See Comments)   Confusion occurred with taking 3 at the same time   Codeine Phosphate Nausea Only   Hydrochlorothiazide Other (See Comments)   Possible acute gout or arthritis of wrist   Montelukast Sodium Other (See Comments)   Reaction not recalled ??   Norco [hydrocodone-acetaminophen] Nausea And Vomiting      Medication List    STOP taking these medications   azithromycin 250 MG tablet Commonly known as:  ZITHROMAX   cefUROXime 250 MG tablet Commonly known as:  CEFTIN   doxycycline 100 MG tablet Commonly known as:  VIBRA-TABS   predniSONE 20 MG tablet Commonly known as:  DELTASONE     TAKE these medications   albuterol (2.5 MG/3ML) 0.083% nebulizer solution Commonly known as:  PROVENTIL Take 3 mLs (2.5 mg total) by nebulization every 6 (six) hours as needed for wheezing or shortness of breath.   amiodarone 200 MG tablet Commonly known as:  PACERONE Take one tablet by mouth daily Monday through Friday.  Do not take on Saturday or Sunday.   apixaban 2.5 MG Tabs tablet Commonly known as:  ELIQUIS Take 1 tablet (2.5 mg total) by mouth 2 (two) times daily.   atorvastatin 20 MG tablet Commonly known as:  LIPITOR TAKE 1 TABLET BY MOUTH DAILY   budesonide 0.5 MG/2ML nebulizer solution Commonly known as:  PULMICORT Take 2 mLs (0.5 mg total) by nebulization 2 (two) times daily.   fluticasone 50 MCG/ACT nasal spray Commonly known as:  FLONASE SHAKE LIQUID AND USE 2 SPRAYS IN EACH NOSTRIL DAILY   furosemide 20 MG tablet Commonly known as:  LASIX Take 1 tablet (20 mg total) by mouth daily.   ipratropium-albuterol 0.5-2.5 (3) MG/3ML Soln Commonly known as:  DUONEB USE 3 ML VIA NEBULIZER EVERY 4 HOURS   metoprolol succinate 25 MG 24 hr tablet Commonly known as:  TOPROL-XL Take 25 mg by mouth daily.   nitroGLYCERIN 0.4 MG SL tablet Commonly known as:  NITROSTAT Place 1 tablet (0.4 mg total) under the  tongue every 5 (five) minutes as needed for chest pain (x 3 tabs).   omeprazole 20 MG capsule Commonly known as:  PRILOSEC Take 1 capsule (20 mg total) by mouth daily.   traMADol 50 MG tablet Commonly known as:  ULTRAM Take 1 tablet (50 mg total) by mouth every 12 (twelve) hours as needed. for pain What changed:  when to take this   vitamin B-12 1000 MCG tablet Commonly known as:  CYANOCOBALAMIN Take 1 tablet (1,000 mcg total) by mouth daily.       Discharge Instructions: Please refer to Patient Instructions section of EMR for full details.  Patient was counseled important signs and symptoms that should prompt return to medical care, changes in medications, dietary instructions, activity restrictions, and follow up appointments.   Follow-Up  Appointments: Contact information for after-discharge care    Destination    [facility] Preferred SNF .   Service:  Skilled Nursing Contact information: [address], [place], DO 01/15/2018, 1:42 PM PGY-1, [place]

## 2018-01-12 NOTE — Plan of Care (Signed)
  Problem: Clinical Measurements: Goal: Ability to avoid or minimize complications of infection will improve Outcome: Progressing   Problem: Skin Integrity: Goal: Skin integrity will improve Outcome: Progressing   Problem: Education: Goal: Knowledge of General Education information will improve Description Including pain rating scale, medication(s)/side effects and non-pharmacologic comfort measures Outcome: Progressing   Problem: Clinical Measurements: Goal: Ability to maintain clinical measurements within normal limits will improve Outcome: Progressing   Problem: Activity: Goal: Risk for activity intolerance will decrease Outcome: Progressing   Problem: Pain Managment: Goal: General experience of comfort will improve Outcome: Progressing   Problem: Safety: Goal: Ability to remain free from injury will improve Outcome: Progressing   Problem: Skin Integrity: Goal: Risk for impaired skin integrity will decrease Outcome: Progressing

## 2018-01-13 LAB — CBC
HEMATOCRIT: 32.1 % — AB (ref 36.0–46.0)
Hemoglobin: 9.7 g/dL — ABNORMAL LOW (ref 12.0–15.0)
MCH: 28.7 pg (ref 26.0–34.0)
MCHC: 30.2 g/dL (ref 30.0–36.0)
MCV: 95 fL (ref 80.0–100.0)
NRBC: 0 % (ref 0.0–0.2)
PLATELETS: 186 10*3/uL (ref 150–400)
RBC: 3.38 MIL/uL — ABNORMAL LOW (ref 3.87–5.11)
RDW: 13.5 % (ref 11.5–15.5)
WBC: 5.6 10*3/uL (ref 4.0–10.5)

## 2018-01-13 LAB — BASIC METABOLIC PANEL
ANION GAP: 5 (ref 5–15)
BUN: 38 mg/dL — AB (ref 8–23)
CALCIUM: 8.4 mg/dL — AB (ref 8.9–10.3)
CO2: 28 mmol/L (ref 22–32)
Chloride: 104 mmol/L (ref 98–111)
Creatinine, Ser: 2.06 mg/dL — ABNORMAL HIGH (ref 0.44–1.00)
GFR calc Af Amer: 23 mL/min — ABNORMAL LOW (ref >60)
GFR, EST NON AFRICAN AMERICAN: 20 mL/min — AB (ref >60)
GLUCOSE: 117 mg/dL — AB (ref 70–99)
Potassium: 4.1 mmol/L (ref 3.5–5.1)
Sodium: 137 mmol/L (ref 135–145)

## 2018-01-13 MED ORDER — GLYCERIN (LAXATIVE) 2.1 G RE SUPP
1.0000 | Freq: Once | RECTAL | Status: DC
  Filled 2018-01-13: qty 1

## 2018-01-13 NOTE — Progress Notes (Signed)
Family Medicine Teaching Service Daily Progress Note Intern Pager: [phone]  Patient name: [name] Medical record number: [id number] Date of birth: 1923/08/03 Age: 84 y.o. Gender: female  Primary Care Provider: McDiarmid, Blane Ohara, MD Consultants: none Code Status: full  Pt Overview and Major Events to Date:  11/13 - admitted  Assessment and Plan: [name] a 84 y.o.femalepresenting with cellulitis and left metatarsal fracture. PMH is significant forHFpEF, GERD, A. Fib oneliquis, Hypercholesterolemia,memory loss, urge incontinence, history of myocardial infarction, sick sinus syndrome, COPD, and lumbar spinal stenosis.  L foot Cellulitis and fifth metatarsal OH:[id number], WBC count 5.6 on 11/16. Remains afebrile. Blood cx negative x2 days. Left foot x-ray showingacute nondisplaced transverse fracture through the base of the left fifth metatarsal with mild soft tissue swelling of the dorsal aspect of the left foot. Patient states she is non-tender and she is able to gingerly walk on the foot. Is adherent with wearing the hard bottom shoe. There is no erythema or temperature increase visible outside of the shoe.  -TransitionIV cefazolin 2 g q12h to Keflex 500mg  q6hr, end date 11/17 -PT/OT -CBC am -Tylenol as needed mild to moderate pain; tramadol for mod to severe pain -Up with assistance -Hard-sole shoe -Vitals per protocol -Continue to follow up with [facility]W for rehab placement - patient prefers [facility] as she liked it when she went several years ago.  Atrial fibrillation: moderately controlled. EKG showed Afib on admission. Home meds:amiodarone 200 mg daily Monday through Friday and Eliquis 2.5 mg BID -Continuous cardiac monitoring -Continue Eliquis; as today is Saturday hold Amiodarone -Vitals per protocol  COPD: chronic, stable; well-controlled - Albuterol neb PRN - Pulmicort BID - DuoNeb PRN  HFpEF: urine output not reported 11/15-11/16 -Continue Lasix 20  mg daily  -Vitals per protocol  H/o MI and sick sinus syndrome with pacemaker: patient without chest pain since admission -Continue metoprolol and Lipitor daily -Nitroglycerinprnchest pain  CKD 3, stable:  - avoid nephrotoxic agents  GERD: home meds: omeprazole at home. -Protonixwhile inpatient   FEN/GI:Saline lock, heart healthy diet, Miralax Prophylaxis:continue homeeliquis  Disposition: med surg  Subjective:  She is seen this morning sitting upright in her recliner, she is in good spirits and states today is the first day she has felt back to her baseline self. She denies shortness of breath, chest pain, abdominal pain, worsening or changed left foot and lower extremity pain. She states she is looking forward to her new facility.  Objective: Temp:  [97.8 F (36.6 C)-98.1 F (36.7 C)] 97.8 F (36.6 C) (11/16 0546) Pulse Rate:  [70-77] 70 (11/16 0546) Resp:  [17-20] 18 (11/16 0546) BP: (111-126)/(61-81) 115/61 (11/16 0546) SpO2:  [92 %-99 %] 99 % (11/16 0546)  Physical Exam  Constitutional: She is oriented to person, place, and time. She appears well-developed and well-nourished.  Frail, pleasant  Cardiovascular: Normal rate.  Irregularly irregular rhythm  Pulmonary/Chest: Effort normal and breath sounds normal.  Musculoskeletal: Normal range of motion. She exhibits edema (L 1+ edema; R trace edema).  Hard-soled shoe in place to L foot  Neurological: She is alert and oriented to person, place, and time.  Skin: Skin is warm and dry. Capillary refill takes less than 2 seconds.  Psychiatric: She has a normal mood and affect.   Laboratory: Recent Labs  Lab 01/11/18 0130 01/12/18 0710 01/13/18 0356  WBC 7.7 5.7 5.6  HGB 10.3* 10.4* 9.7*  HCT 35.4* 33.9* 32.1*  PLT 196 182 186   Recent Labs  Lab  01/10/18 1521 01/11/18 0130 01/12/18 0710 01/13/18 0356  NA 137 139 138 137  K 3.8 4.0 4.0 4.1  CL 98 104 103 104  CO2 26 25 27 28   BUN 35* 35* 36* 38*   CREATININE 1.76* 1.69* 1.94* 2.06*  CALCIUM 9.0 8.5* 8.7* 8.4*  PROT 6.6  --   --   --   BILITOT 1.2  --   --   --   ALKPHOS 60  --   --   --   ALT 24  --   --   --   AST 30  --   --   --   GLUCOSE 108* 126* 99 117*   Lactic Acid 11/13: 1.29 Blood Cx 11/13: NGTD HbA1c: 5.5% Tsh 09/2017: 1.374  Imaging/Diagnostic Tests: Dg Tibia/fibula Left  Result Date: 01/10/2018 CLINICAL DATA:  Initial evaluation for acute pain for 1 week, no injury. EXAM: LEFT TIBIA AND FIBULA - 2 VIEW COMPARISON:  Prior radiograph of the left foot from 11/29/2017. FINDINGS: No acute fracture or dislocation about the left tibia/fibula. Left total knee arthroplasty in place without obvious complication. Bones are osteopenic. Mild soft tissue swelling anterior to the left ankle, improved from prior radiograph. There is a new acute transverse nondisplaced fracture through the base of the right fifth metatarsal. IMPRESSION: 1. Acute transverse nondisplaced fracture through the base of the right fifth metatarsal. Finding better evaluated on concomitant radiograph of the left foot. 2. No acute fracture or dislocation about the left tibia/fibula. 3. Left knee arthroplasty in place without complication. Electronically Signed   By: Jeannine Boga M.D.   On: 01/10/2018 16:20   Dg Foot Complete Left  Result Date: 01/10/2018 CLINICAL DATA:  Initial evaluation for acute pain, no known injury, soft tissue swelling at the dorsal aspect of the foot. EXAM: LEFT FOOT - COMPLETE 3+ VIEW COMPARISON:  Prior radiograph from 11/29/2017. FINDINGS: Acute transverse nondisplaced fracture through the base of the left fifth metatarsal, new relative to recent radiograph. No other acute fracture or dislocation. Joint spaces maintained without evidence for significant degenerative or erosive arthropathy. Mild soft tissue swelling at the dorsal aspect of the foot. Tiny posterior and plantar calcaneal enthesophytes noted. Osteopenia. IMPRESSION: 1.  Acute nondisplaced transverse fracture through the base of the left fifth metatarsal. 2. Mild soft tissue swelling at the dorsal aspect of the left foot. Electronically Signed   By: Jeannine Boga M.D.   On: 01/10/2018 16:22   Vas [place] Lower Extremity Venous (dvt) (only [facility])  Result Date: 01/10/2018  Lower Venous Study Indications: Pain, Swelling, and Erythema.  Risk Factors: Trauma Broken left foot. Performing Technologist: Toma Copier RVS  Examination Guidelines: A complete evaluation includes B-mode imaging, spectral Doppler, color Doppler, and power Doppler as needed of all accessible portions of each vessel. Bilateral testing is considered an integral part of a complete examination. Limited examinations for reoccurring indications may be performed as noted.  Right Venous Findings: +---+---------------+---------+-----------+----------+-------+    CompressibilityPhasicitySpontaneityPropertiesSummary +---+---------------+---------+-----------+----------+-------+ CFVFull           Yes      Yes                          +---+---------------+---------+-----------+----------+-------+ SFJFull                                                 +---+---------------+---------+-----------+----------+-------+  Left Venous Findings: +---------+---------------+---------+-----------+----------+-------+          CompressibilityPhasicitySpontaneityPropertiesSummary +---------+---------------+---------+-----------+----------+-------+ CFV      Full           Yes      Yes                          +---------+---------------+---------+-----------+----------+-------+ SFJ      Full                                                 +---------+---------------+---------+-----------+----------+-------+ FV Prox  Full           Yes      Yes                          +---------+---------------+---------+-----------+----------+-------+ FV Mid   Full                                                  +---------+---------------+---------+-----------+----------+-------+ FV DistalFull           Yes      Yes                          +---------+---------------+---------+-----------+----------+-------+ PFV      Full           Yes      Yes                          +---------+---------------+---------+-----------+----------+-------+ POP      Full           Yes      Yes                          +---------+---------------+---------+-----------+----------+-------+ PTV      Full                                                 +---------+---------------+---------+-----------+----------+-------+ PERO     Full                                                 +---------+---------------+---------+-----------+----------+-------+    Summary: Right: There is no evidence of a common femoral vein obstruction. Left: There is no evidence of deep vein thrombosis in the lower extremity. No cystic structure found in the popliteal fossa.  *See table(s) above for measurements and observations. Electronically signed by Harold Barban MD on 01/10/2018 at 7:14:32 PM.    Final      Daisy Floro, DO 01/13/2018, 6:09 AM PGY-1, [place] Intern pager: [phone], text pages welcome\

## 2018-01-14 ENCOUNTER — Encounter (HOSPITAL_COMMUNITY): Payer: Self-pay | Admitting: Nephrology

## 2018-01-14 DIAGNOSIS — N179 Acute kidney failure, unspecified: Secondary | ICD-10-CM

## 2018-01-14 LAB — BASIC METABOLIC PANEL
Anion gap: 8 (ref 5–15)
BUN: 36 mg/dL — AB (ref 8–23)
CALCIUM: 8.5 mg/dL — AB (ref 8.9–10.3)
CO2: 28 mmol/L (ref 22–32)
Chloride: 105 mmol/L (ref 98–111)
Creatinine, Ser: 2.25 mg/dL — ABNORMAL HIGH (ref 0.44–1.00)
GFR calc Af Amer: 21 mL/min — ABNORMAL LOW (ref >60)
GFR, EST NON AFRICAN AMERICAN: 18 mL/min — AB (ref >60)
GLUCOSE: 114 mg/dL — AB (ref 70–99)
Potassium: 4 mmol/L (ref 3.5–5.1)
SODIUM: 141 mmol/L (ref 135–145)

## 2018-01-14 LAB — CREATININE, URINE, RANDOM: Creatinine, Urine: 72.09 mg/dL

## 2018-01-14 LAB — SODIUM, URINE, RANDOM: Sodium, Ur: 43 mmol/L

## 2018-01-14 MED ORDER — SODIUM CHLORIDE 0.9 % IV SOLN
INTRAVENOUS | Status: AC
  Administered 2018-01-14: 09:00:00 via INTRAVENOUS

## 2018-01-14 NOTE — Consult Note (Signed)
Renal Service Consult Note [facility]  ANAELLE DUNTON 01/14/2018 Sol Blazing Requesting Physician:  Dr Mingo Amber  Reason for Consult:  Renal failure HPI: The patient is a 84 y.o. year-old w/ hx of PPM/ SSS, HTN, mac degeneration, diast HF, CKD III, hx L nephrectomy for transitional cell renal Ca, hx COPD severe, afib who was admitted 11/13 for cellulitis L foot and toe fracture 5th metatarsal. This is D#3 of IV abx and foot is doing better per the primary team notes. Plan now is for dc to SNF, awaiting a bed.  Creat is rising in hospital however, we are asked to see for this.    Creat here was 1.69 on 11/13 now 2.25 on 11/17.  Usual creat as below is 1.6- 2.0.   Inpatient meds reviewed, no nsaid/ contrast/ acei/ ARB. Has been getting po lasix daily, dc'd today. BP's here normal 845- 364 systolic.  Pt denies any SOB, cough or orthopnea.  seh is up in a chair w/o complaints.    Date   Creat  eGFR  2008- 2010 0.8- 1.1 42- 55  2011- 2016 1.1- 1.5  early 2017 1.2- 1.7  late 2017 1.5- 2.4  2018  1.3 - 1.8 25- 38, CKD III/ IV  feb 2019 1.81  24  April  1.59  July  1.79  22  august 1.6- 2.1 19- 27, CKD IV  sept  1.86  oct  1.90  23,  CKD IV  nov 13> 17 1.76 > 2.25 24> 18    EChart:  09/2008 - CAD sp PCI, HTN, afib, hx L nephrect  06/2009 - L TKA, anemia  12/2011 - anemia acute/ chron, afib, CKD III  02/2013 - COPD exac  05/2014 - afib /w RVR > Rx BB  01/2015 - COPD exac  04/2015 - CP atypical, medical Rx  05/2015 - COPD exac, AMS, fall at home, PAF  10/2015 - a/c resp failrue, COPD/ HTN/ afib and CKD IV  08/2017 - COPD exac, stop lasix taking too much  09/2017 - LE edema / SOB, a/c CHF, afib > ^BB/ ^amio, diuresed, peak Cr 1.81, DCCV afib   ROS  denies CP  no joint pain   no HA  no blurry vision  no rash  no diarrhea  no nausea/ vomiting    Past Medical History  Past Medical History:  Diagnosis Date  . Abnormal mammogram, unspecified 08/23/2010   Followup  imaging reassuring.   . Acute appendicitis with rupture   . ADENOMATOUS COLONIC POLYP 03/01/2003   Qualifier: Diagnosis of  By: McDiarmid MD, Sherren Mocha Multiple benign polyps of cecum, ascending, transverse and sigmoid colon by 8/09 colonoscopy by Dr Cristina Gong  Colonoscopy by Dr Cristina Gong for iron-deficiency anemia on 04/27/2010 showed three sessile polyps that were in ascending (3 mm x 9 mm), transverse (4 mm), and cecum (3 mm).  All three were tubular adenomas that were negative for high grade dysplasia or malignancy on pathology. Dr Cristina Gong called the polpys benign and not requiring follow-up in view of the patients age.    . Adrenal adenoma    Incidentaloma  . AF (paroxysmal atrial fibrillation) ([place]) 11/11/2010   Hospitalization (9/8-9/10, Dr Daneen Schick, III, Cardiology) for Paroxysmal Atrial Fibrillation with RVR and anginal pain secondary to demand/supply mismatch in setting of RVR with known circumflex artery branch disease.    Marland Kitchen ANXIETY 04/27/2006   Qualifier: Diagnosis of  By: McDiarmid MD, Sherren Mocha    . Benign essential tremor 02/04/2016  .  Candidiasis of the esophagus 11/26/2007  . CAP (community acquired pneumonia) 02/25/2015  . Cataract 2013   Bilateral   . Cellulitis of leg, right 10/01/2012  . Cellulitis of right lower extremity   . Chest pain with moderate risk of acute coronary syndrome 05/21/2015  . Chronic kidney disease (CKD), stage III (moderate) (HCC)   . Concussion with loss of consciousness   . COPD 04/27/2006   Qualifier: Diagnosis of  By: McDiarmid MD, Sherren Mocha    . COPD exacerbation ([place])   . COPD, severe   . Decreased functional mobility and endurance 03/17/2015  . Diastolic heart failure ([place]) 07/30/2015  . DISC WITH RADICULOPATHY 04/27/2006   Qualifier: Diagnosis of  By: McDiarmid MD, Sherren Mocha    . Dyspnea   . EDEMA-LEGS,DUE TO VENOUS OBSTRUCT. 04/27/2006   Qualifier: Diagnosis of  By: McDiarmid MD, Sherren Mocha    . Gout of wrist due to drug 03/15/2010   Qualifier: Diagnosis of  By: McDiarmid MD,  Sherren Mocha  Possibly precipitated by HCTZ. Normal uric acid serum level at time of attack.    Marland Kitchen HERNIA, HIATAL, NONCONGENITAL 04/27/2006   Qualifier: Diagnosis of  By: McDiarmid MD, Sherren Mocha    . High risk medications (not anticoagulants) long-term use 03/05/2012  . History of Hemorrhoids 04/27/2006   Qualifier: Diagnosis of  By: McDiarmid MD, Sherren Mocha    . History of iron deficiency 01/16/2015  . Hx of colonoscopy with polypectomy 04/27/2010   Dr Cristina Gong found three  tubular adenomas each less than 10 mm size  . Hypertension   . Hypotension 07/09/2015  . Iron deficiency anemia 08/05/2010   Dr Cristina Gong (GI) has evaluated with EGD, colonoscopy, and video capsular endoscopy in 2011 & 2012.  All have been unrevealing as to an origin of IDA.  OV with Dr Cristina Gong (10/28/10) assessment of blood in stool per hemoccult and GER. Hbg 12.1 g/dL, MCV 91.8, Ferritin 30 ng/mL. Patient taking on ferrous sulfate tab daily.   EGD on 03/06/12 by Dr Cristina Gong for IDA non-obstructing Schatzki's ring at Gastroesophageal junction, otherwise normal esophagus and stomach.     . Leg cramps 05/29/2012  . Lumbar herniated disc    History of HNP L4/5 in 2003  . Macular degeneration, bilateral 10/04/2010   Right eye is wet MD, the other is dry macular degeneration (ARMD). Pt undergoing some form of vascular endothelial growth factor inhibition intraocular therapy.    . Mild cognitive impairment 10/26/2012   (10/25/12) Failed MiniCog screen  . MUSCLE CRAMPS 03/11/2010   Qualifier: Diagnosis of  By: McDiarmid MD, Sherren Mocha    . Muscle spasm of back 09/24/2013  . Myocardial infarct, old   . Nocturia 10/28/2016  . Numbness and tingling in hands 07/21/2011  . Pacemaker    MDT  . PREDIABETES 09/11/2007   Qualifier: Diagnosis of  By: McDiarmid MD, Sherren Mocha    . Retinal hemorrhage of left eye 06/2010  . RHINITIS, ALLERGIC 04/27/2006   Qualifier: Diagnosis of  By: McDiarmid MD, Sherren Mocha    . SCHATZKI'S RING, HX OF 11/26/2007   Qualifier: Diagnosis of  By: McDiarmid MD, Sherren Mocha   An EGD was performed by Dr Cristina Gong on 04/27/2010 for iron deficiency anemia. There was a a transient hiatal hernia with Schatzki's ring. Stomach and duodenum were normal. EGD on 03/06/12 by Dr Cristina Gong for IDA non-obstructing Schatzki's ring at Gastroesophageal junction, otherwise normal esophagus and stomach.    . Seborrheic keratosis, right anterior thigh 12/12/2013  . Shoulder pain, left 01/09/2014  . Sick sinus syndrome  with tachycardia ([place])    MDT  . Soft tissue injury of foot 05/03/2011  . Solar lentigo 06/15/2012  . Solitary kidney, acquired 05/17/2010   Surgical removal for transitional cell cancer by Tresa Endo, MD ([facility]). Surveillance cystoscopy by Dr Alinda Money [facility]) on 10/19/12 without evidence of cystoscopic recurrence. Recommend RTC one year for cystoscopy.   Marland Kitchen Spinal stenosis, lumbar   . Transitional cell carcinoma of ureter, history   . Urge incontinence 12/13/2011   Diagnosed in 10/2011 by Dr Bjorn Loser (Urology)   . VENTRICULAR HYPERTROPHY, LEFT 08/28/2008   Qualifier: Diagnosis of  By: McDiarmid MD, Sherren Mocha    . VITAMIN B12 DEFICIENCY 10/07/2009   Qualifier: Diagnosis of  By: McDiarmid MD, Sherren Mocha  Dx based on a post-TKR anemia work-up Low normal serum B12 with high Methylmalonic acid and homocysteine level  Vit B12 serum level (10/28/10) > 1500 pg/mL   . Vitamin D deficiency 11/02/2010   Serum vitamin D 25(OH) = 10.9 ng/mL (30 -100) on 10/28/10 c/w Vitamin D deficiency.      Past Surgical History  Past Surgical History:  Procedure Laterality Date  . BREAST SURGERY     breast reduction  . CARDIOVERSION N/A 10/20/2017   Procedure: CARDIOVERSION;  Surgeon: Josue Hector, MD;  Location: [facility] ENDOSCOPY;  Service: Cardiovascular;  Laterality: N/A;  . CHOLECYSTECTOMY    . CORONARY ANGIOPLASTY WITH STENT PLACEMENT  2010   BMS RCA, OM2 occluded  . CYSTOSCOPY  09/30/2015   No cystoscopic evidence of uroepithelial neoplasm.  Follow up surveillance cystoscopy in one year  .  CYSTOSCOPY  10/13/2017   Dr Raynelle Bring [facility])  . DUPUYTREN CONTRACTURE RELEASE Right 05/22/2014   Procedure: DUPUYTREN RELEASE AND REPAIR AS NECESSARY RIGHT RING FINGER AND MIDDLE FINGER;  Surgeon: Roseanne Kaufman, MD;  Location: [place];  Service: Orthopedics;  Laterality: Right;  . ESOPHAGOGASTRODUODENOSCOPY  04/27/2010   Dr Cristina Gong - found transient H/H & Schatzki's ring  . ESOPHAGOGASTRODUODENOSCOPY ENDOSCOPY  03/06/2012   Dr Cristina Gong - found non-obstuctive Schatzki's ring at [employer] o/w normal EGD.   Marland Kitchen EYE SURGERY     bilateral cataracts  . KNEE ARTHROSCOPY W/ SYNOVECTOMY  11/2009, left knee   Dr Wynelle Link  . NEPHRECTOMY  For transition cell cancer    Dr Tresa Endo, surgeon  . PACEMAKER GENERATOR CHANGE N/A 11/12/2012   Procedure: PACEMAKER GENERATOR CHANGE;  Surgeon: Evans Lance, MD; [employer] [facility] dual-chamber pacemaker serial number [id number]; Laterality: Right  . PACEMAKER INSERTION  2005   Dr Doreatha Lew  . PACEMAKER LEAD REMOVAL  2005   Removal and reinsertion of atrial and ventricular leads due to migration  . REPLACEMENT TOTAL KNEE  2009, Right knee   Dr Wynelle Link  . REPLACEMENT TOTAL KNEE  05/2009, Left knee   Dr Wynelle Link  . TONSILLECTOMY    . TRIGGER FINGER RELEASE Right 05/22/2014   Procedure: RIGHT HAND A-1 PULLEY RELEASE ;  Surgeon: Roseanne Kaufman, MD;  Location: [facility];  Service: Orthopedics;  Laterality: Right;   Family History  Family History  Problem Relation Age of Onset  . Dementia Mother   . Heart disease Father   . Heart attack Father    Social History  reports that she quit smoking about 8 years ago. Her smoking use included cigarettes. She smoked 1.00 pack per day. She has never used smokeless tobacco. She reports that she drinks about 13.0 standard drinks of alcohol per week. She reports that she does not use drugs. Allergies  Allergies  Allergen Reactions  . Baclofen Other (See Comments)    Confusion occurred with taking 3 at the same time  .  Codeine Phosphate Nausea Only  . Hydrochlorothiazide Other (See Comments)    Possible acute gout or arthritis of wrist  . Montelukast Sodium Other (See Comments)    Reaction not recalled ??  Lebron Quam [Hydrocodone-Acetaminophen] Nausea And Vomiting   Home medications Prior to Admission medications   Medication Sig Start Date End Date Taking? Authorizing Provider  albuterol (PROVENTIL) (2.5 MG/3ML) 0.083% nebulizer solution Take 3 mLs (2.5 mg total) by nebulization every 6 (six) hours as needed for wheezing or shortness of breath. 01/02/18 12/28/18 Yes McDiarmid, Blane Ohara, MD  amiodarone (PACERONE) 200 MG tablet Take one tablet by mouth daily Monday through Friday.  Do not take on Saturday or Sunday. 10/23/17  Yes Evans Lance, MD  apixaban (ELIQUIS) 2.5 MG TABS tablet Take 1 tablet (2.5 mg total) by mouth 2 (two) times daily. 10/21/17  Yes Simmons, Brittainy M, PA-C  atorvastatin (LIPITOR) 20 MG tablet TAKE 1 TABLET BY MOUTH DAILY 12/19/16  Yes McDiarmid, Blane Ohara, MD  budesonide (PULMICORT) 0.5 MG/2ML nebulizer solution Take 2 mLs (0.5 mg total) by nebulization 2 (two) times daily. 10/09/17  Yes McDiarmid, Blane Ohara, MD  fluticasone (FLONASE) 50 MCG/ACT nasal spray SHAKE LIQUID AND USE 2 SPRAYS IN EACH NOSTRIL DAILY 01/31/17  Yes Byrum, Rose Fillers, MD  furosemide (LASIX) 20 MG tablet Take 1 tablet (20 mg total) by mouth daily. 10/21/17  Yes Simmons, Brittainy M, PA-C  ipratropium-albuterol (DUONEB) 0.5-2.5 (3) MG/3ML SOLN USE 3 ML VIA NEBULIZER EVERY 4 HOURS 08/25/17  Yes Byrum, Rose Fillers, MD  metoprolol succinate (TOPROL-XL) 25 MG 24 hr tablet Take 25 mg by mouth daily.   Yes [provider]  nitroGLYCERIN (NITROSTAT) 0.4 MG SL tablet Place 1 tablet (0.4 mg total) under the tongue every 5 (five) minutes as needed for chest pain (x 3 tabs). 11/16/15  Yes McDiarmid, Blane Ohara, MD  omeprazole (PRILOSEC) 20 MG capsule Take 1 capsule (20 mg total) by mouth daily. 09/14/17  Yes Hensel, Jamal Collin, MD  traMADol  (ULTRAM) 50 MG tablet TAKE 1 TABLET BY MOUTH EVERY 6 HOURS AS NEEDED FOR PAIN 03/17/17  Yes McDiarmid, Blane Ohara, MD  azithromycin (ZITHROMAX) 250 MG tablet Take one tablet daily for 5 days Patient not taking: Reported on 12/25/2017 12/13/17   Collene Gobble, MD  cefUROXime (CEFTIN) 250 MG tablet Take 1 tablet (250 mg total) by mouth 2 (two) times daily with a meal. Patient not taking: Reported on 12/25/2017 12/13/17   Collene Gobble, MD  doxycycline (VIBRA-TABS) 100 MG tablet Take 1 tablet (100 mg total) by mouth 2 (two) times daily. Patient not taking: Reported on 12/25/2017 12/13/17   Collene Gobble, MD  predniSONE (DELTASONE) 20 MG tablet Take 2 tablets (40 mg total) by mouth daily. Take for 7 days Patient not taking: Reported on 12/25/2017 11/02/17   McDiarmid, Blane Ohara, MD  vitamin B-12 (CYANOCOBALAMIN) 1000 MCG tablet Take 1 tablet (1,000 mcg total) by mouth daily. Patient not taking: Reported on 01/10/2018 09/14/17   Zenia Resides, MD   Liver Function Tests Recent Labs  Lab 01/10/18 1521  AST 30  ALT 24  ALKPHOS 60  BILITOT 1.2  PROT 6.6  ALBUMIN 3.6   No results for input(s): LIPASE, AMYLASE in the last 168 hours. CBC Recent Labs  Lab 01/10/18 1521 01/11/18 0130 01/12/18 0710 01/13/18 0356  WBC 7.9 7.7 5.7 5.6  NEUTROABS 5.5  --   --   --   HGB 11.9* 10.3* 10.4* 9.7*  HCT 39.3 35.4* 33.9* 32.1*  MCV 95.4 94.7 94.4 95.0  PLT 197 196 182 704   Basic Metabolic Panel Recent Labs  Lab 01/10/18 1521 01/11/18 0130 01/12/18 0710 01/13/18 0356 01/14/18 0332  NA 137 139 138 137 141  K 3.8 4.0 4.0 4.1 4.0  CL 98 104 103 104 105  CO2 _0 GLUCOSE 108* 126* 99 117* 114*  BUN 35* 35* 36* 38* 36*  CREATININE 1.76* 1.69* 1.94* 2.06* 2.25*  CALCIUM 9.0 8.5* 8.7* 8.4* 8.5*   Iron/TIBC/Ferritin/ %Sat    Component Value Date/Time   IRON 134 01/08/2012 1715   TIBC 350 03/29/2010 0000   TIBC 350 03/29/2010 0000   FERRITIN 311 (H) 03/12/2015 0952   IRONPCTSAT  3 (L) 03/29/2010 0000   IRONPCTSAT 3 (L) 03/29/2010 0000    Vitals:   01/13/18 1908 01/13/18 1949 01/13/18 2300 01/14/18 0342  BP:  (!) 148/84 114/63 126/70  Pulse:  82 83 77  Resp:  _1 Temp:  98.3 F (36.8 C)  97.9 F (36.6 C)  TempSrc:  Oral  Oral  SpO2: 99% 100% 98% 95%  Weight:      Height:       Exam Gen alert elderly WF, no distress, up in a chair No rash, cyanosis or gangrene Sclera anicteric, throat clear  No jvd or bruits Chest bilat poor air movment and scattered wheezes RRR no MRG Abd soft ntnd no mass or ascites +bs GU defer MS no joint effusions or deformity Ext 1+ LLE pretib edema, no wounds or ulcer Neuro is alert, Ox 3 , nf  UA > 11-20 wbc, 0-5 rbc, prot neg   Home meds:  - amiodarone 200/ atorvastatin 20 qd/ apixaban 2.5 bid/ metoprolol succ 25 qd/ furosemide 20 qd  - albuterol nebs prn/ budesonide bid nebs/ ipratropium-albuterol q 4prn/ pred taper  - omeprazole 20 qd/ tramadol 50 prn/ sl NTG prn   Impression/ Plan: 1. Renal failure - acute on CKD IV, suspect diuretic related. Lasix dc'd , agree w/ IVF at 75/hr for now.  No other renal toxins noted, BP's ok. Check urine lytes.  Creat is only slightly out of her range (1.6- 2.1) so would not try to normalize too much as she was here in Aug w/ CHF exac after lasix was held in July.  2. LLE cellulitis - better, sp IV abx now getting po 3. COPD - sig issue, mult admits 4. Afib - on amio/ eliquis/ BB 5. HTN - bp's good, on BB only   Kelly Splinter MD [employer] pager [phone]   01/14/2018, 1:34 PM

## 2018-01-14 NOTE — Progress Notes (Signed)
Family Medicine Teaching Service Daily Progress Note Intern Pager: [phone]  Patient name: [name] Medical record number: [id number] Date of birth: 24-Dec-1923 Age: 84 y.o. Gender: female  Primary Care Provider: McDiarmid, Blane Ohara, MD Consultants: PT/OT, CSW, WOC Code Status: full  Pt Overview and Major Events to Date:  11/13 - admitted to [place] for pain control  Assessment and Plan: [name] a 84 y.o.femalepresenting with cellulitis and left metatarsal fracture. PMH is significant forHFpEF, GERD, A. Fib oneliquis, Hypercholesterolemia,memory loss, urge incontinence, history of myocardial infarction, sick sinus syndrome, COPD, and lumbar spinal stenosis.  L foot Cellulitis and fifth metatarsal ZH:YQMVH cx negative x 3 days. PT/OT recommending SNF -Last day of PO Keflex for cellulitis -wound care consulted to assess non-healing wound of L dorsal foot -Tylenol as needed mild to moderate pain; tramadol for moderate to severe pain -Up with assistance -Hard-sole shoe for ambulation -Vitals per protocol -Continue to follow up with CSW for rehab placement  Acute on CKD 3 w/ hx of nephrectomy: Cr trending up, GFR trending down. Baseline appears to be ~ 1.6 but has gone up to 2.7 in 2017. Patient reports normal urine output. No urine output charted over past 48 hours. -start gentle IV fluids 75 cc/hr for 10 hours -will c/s nephrology as patient has solitary kidney -obtain urine cr, sodium to calculate Fena -am BMP -strict I/Os  Atrial fibrillation, stable: rate controlled. Home meds:amiodarone 200 mg daily Monday through Friday and Eliquis 2.5 mg BID -Continuous cardiac monitoring -Continue Eliquis; as today is Saturday hold Amiodarone -Vitals per protocol  COPD: chronic, stable; well-controlled - Albuterol neb PRN - Pulmicort BID - DuoNeb PRN  HFpEF: urine output not reported 11/15-11/16 -Continue Lasix 20 mg daily  -Vitals per protocol  H/o MI and sick sinus  syndrome with pacemaker: patient without chest pain since admission -Continue metoprolol and Lipitor daily -Nitroglycerinprnchest pain  GERD: home meds: omeprazole at home. -Protonixwhile inpatient  FEN/GI:Saline lock, heart healthy diet, Miralax Prophylaxis: homeeliquis  Disposition: SNF  Subjective:   Doing well, pain is well controlled. She reports normal urination and that she is going often. Denies dysuria. She has a solitary kidney.   Objective: Temp:  [97.9 F (36.6 C)-98.9 F (37.2 C)] 97.9 F (36.6 C) (11/17 0342) Pulse Rate:  [75-120] 77 (11/17 0342) Resp:  [15-20] 15 (11/17 0342) BP: (114-148)/(63-84) 126/70 (11/17 0342) SpO2:  [95 %-100 %] 95 % (11/17 0342)  PE: Gen: pleasant elderly lady in NAD Heart: regular rate Lungs: no increased work of breathing Abdomen: soft, non-tender, non-distended, +BS Extremities: +2 edema bilaterally, palpable DP pulses. Small erythematous lesion on dorsal left foot without surrounding erythema or fluctuance, no drainage Neuro: no focal deficits  Laboratory: Recent Labs  Lab 01/11/18 0130 01/12/18 0710 01/13/18 0356  WBC 7.7 5.7 5.6  HGB 10.3* 10.4* 9.7*  HCT 35.4* 33.9* 32.1*  PLT 196 182 186   Recent Labs  Lab 01/10/18 1521  01/12/18 0710 01/13/18 0356 01/14/18 0332  NA 137   < > 138 137 141  K 3.8   < > 4.0 4.1 4.0  CL 98   < > 103 104 105  CO2 26   < > 27 28 28   BUN 35*   < > 36* 38* 36*  CREATININE 1.76*   < > 1.94* 2.06* 2.25*  CALCIUM 9.0   < > 8.7* 8.4* 8.5*  PROT 6.6  --   --   --   --   BILITOT 1.2  --   --   --   --  ALKPHOS 60  --   --   --   --   ALT 24  --   --   --   --   AST 30  --   --   --   --   GLUCOSE 108*   < > 99 117* 114*   < > = values in this interval not displayed.   Lactic Acid 11/13: 1.29 Blood Cx 11/13: NGTD HbA1c: 5.5% Tsh 09/2017: 1.374  Imaging/Diagnostic Tests: Dg Tibia/fibula Left  Result Date: 01/10/2018 CLINICAL DATA:  Initial evaluation for acute pain  for 1 week, no injury. EXAM: LEFT TIBIA AND FIBULA - 2 VIEW COMPARISON:  Prior radiograph of the left foot from 11/29/2017. FINDINGS: No acute fracture or dislocation about the left tibia/fibula. Left total knee arthroplasty in place without obvious complication. Bones are osteopenic. Mild soft tissue swelling anterior to the left ankle, improved from prior radiograph. There is a new acute transverse nondisplaced fracture through the base of the right fifth metatarsal. IMPRESSION: 1. Acute transverse nondisplaced fracture through the base of the right fifth metatarsal. Finding better evaluated on concomitant radiograph of the left foot. 2. No acute fracture or dislocation about the left tibia/fibula. 3. Left knee arthroplasty in place without complication. Electronically Signed   By: Jeannine Boga M.D.   On: 01/10/2018 16:20   Dg Foot Complete Left  Result Date: 01/10/2018 CLINICAL DATA:  Initial evaluation for acute pain, no known injury, soft tissue swelling at the dorsal aspect of the foot. EXAM: LEFT FOOT - COMPLETE 3+ VIEW COMPARISON:  Prior radiograph from 11/29/2017. FINDINGS: Acute transverse nondisplaced fracture through the base of the left fifth metatarsal, new relative to recent radiograph. No other acute fracture or dislocation. Joint spaces maintained without evidence for significant degenerative or erosive arthropathy. Mild soft tissue swelling at the dorsal aspect of the foot. Tiny posterior and plantar calcaneal enthesophytes noted. Osteopenia. IMPRESSION: 1. Acute nondisplaced transverse fracture through the base of the left fifth metatarsal. 2. Mild soft tissue swelling at the dorsal aspect of the left foot. Electronically Signed   By: Jeannine Boga M.D.   On: 01/10/2018 16:22   Vas [place] Lower Extremity Venous (dvt) (only [facility])  Result Date: 01/10/2018  Lower Venous Study Indications: Pain, Swelling, and Erythema.  Risk Factors: Trauma Broken left foot. Performing  Technologist: Toma Copier RVS  Examination Guidelines: A complete evaluation includes B-mode imaging, spectral Doppler, color Doppler, and power Doppler as needed of all accessible portions of each vessel. Bilateral testing is considered an integral part of a complete examination. Limited examinations for reoccurring indications may be performed as noted.  Right Venous Findings: +---+---------------+---------+-----------+----------+-------+    CompressibilityPhasicitySpontaneityPropertiesSummary +---+---------------+---------+-----------+----------+-------+ CFVFull           Yes      Yes                          +---+---------------+---------+-----------+----------+-------+ SFJFull                                                 +---+---------------+---------+-----------+----------+-------+  Left Venous Findings: +---------+---------------+---------+-----------+----------+-------+          CompressibilityPhasicitySpontaneityPropertiesSummary +---------+---------------+---------+-----------+----------+-------+ CFV      Full           Yes      Yes                          +---------+---------------+---------+-----------+----------+-------+  SFJ      Full                                                 +---------+---------------+---------+-----------+----------+-------+ FV Prox  Full           Yes      Yes                          +---------+---------------+---------+-----------+----------+-------+ FV Mid   Full                                                 +---------+---------------+---------+-----------+----------+-------+ FV DistalFull           Yes      Yes                          +---------+---------------+---------+-----------+----------+-------+ PFV      Full           Yes      Yes                          +---------+---------------+---------+-----------+----------+-------+ POP      Full           Yes      Yes                           +---------+---------------+---------+-----------+----------+-------+ PTV      Full                                                 +---------+---------------+---------+-----------+----------+-------+ PERO     Full                                                 +---------+---------------+---------+-----------+----------+-------+    Summary: Right: There is no evidence of a common femoral vein obstruction. Left: There is no evidence of deep vein thrombosis in the lower extremity. No cystic structure found in the popliteal fossa.  *See table(s) above for measurements and observations. Electronically signed by Harold Barban MD on 01/10/2018 at 7:14:32 PM.    Final      Steve Rattler, DO 01/14/2018, 8:01 AM PGY-3, [place] Intern pager: [phone], text pages welcome\

## 2018-01-15 ENCOUNTER — Telehealth: Payer: Self-pay | Admitting: Family Medicine

## 2018-01-15 ENCOUNTER — Encounter (HOSPITAL_COMMUNITY): Payer: Self-pay

## 2018-01-15 DIAGNOSIS — I509 Heart failure, unspecified: Secondary | ICD-10-CM | POA: Diagnosis not present

## 2018-01-15 DIAGNOSIS — R41841 Cognitive communication deficit: Secondary | ICD-10-CM | POA: Diagnosis not present

## 2018-01-15 DIAGNOSIS — R600 Localized edema: Secondary | ICD-10-CM | POA: Diagnosis not present

## 2018-01-15 DIAGNOSIS — M6281 Muscle weakness (generalized): Secondary | ICD-10-CM | POA: Diagnosis not present

## 2018-01-15 DIAGNOSIS — M2550 Pain in unspecified joint: Secondary | ICD-10-CM | POA: Diagnosis not present

## 2018-01-15 DIAGNOSIS — S92355D Nondisplaced fracture of fifth metatarsal bone, left foot, subsequent encounter for fracture with routine healing: Secondary | ICD-10-CM | POA: Diagnosis not present

## 2018-01-15 DIAGNOSIS — S92355A Nondisplaced fracture of fifth metatarsal bone, left foot, initial encounter for closed fracture: Secondary | ICD-10-CM | POA: Diagnosis not present

## 2018-01-15 DIAGNOSIS — J449 Chronic obstructive pulmonary disease, unspecified: Secondary | ICD-10-CM | POA: Diagnosis not present

## 2018-01-15 DIAGNOSIS — I4891 Unspecified atrial fibrillation: Secondary | ICD-10-CM | POA: Diagnosis not present

## 2018-01-15 DIAGNOSIS — Z7401 Bed confinement status: Secondary | ICD-10-CM | POA: Diagnosis not present

## 2018-01-15 DIAGNOSIS — H353211 Exudative age-related macular degeneration, right eye, with active choroidal neovascularization: Secondary | ICD-10-CM | POA: Diagnosis not present

## 2018-01-15 DIAGNOSIS — N183 Chronic kidney disease, stage 3 (moderate): Secondary | ICD-10-CM | POA: Diagnosis not present

## 2018-01-15 DIAGNOSIS — W228XXA Striking against or struck by other objects, initial encounter: Secondary | ICD-10-CM | POA: Diagnosis not present

## 2018-01-15 DIAGNOSIS — Z950 Presence of cardiac pacemaker: Secondary | ICD-10-CM | POA: Diagnosis not present

## 2018-01-15 DIAGNOSIS — Z23 Encounter for immunization: Secondary | ICD-10-CM | POA: Diagnosis not present

## 2018-01-15 DIAGNOSIS — I4821 Permanent atrial fibrillation: Secondary | ICD-10-CM | POA: Diagnosis not present

## 2018-01-15 DIAGNOSIS — I252 Old myocardial infarction: Secondary | ICD-10-CM | POA: Diagnosis not present

## 2018-01-15 DIAGNOSIS — L97529 Non-pressure chronic ulcer of other part of left foot with unspecified severity: Secondary | ICD-10-CM | POA: Diagnosis not present

## 2018-01-15 DIAGNOSIS — R52 Pain, unspecified: Secondary | ICD-10-CM | POA: Diagnosis not present

## 2018-01-15 DIAGNOSIS — N189 Chronic kidney disease, unspecified: Secondary | ICD-10-CM | POA: Diagnosis not present

## 2018-01-15 DIAGNOSIS — K219 Gastro-esophageal reflux disease without esophagitis: Secondary | ICD-10-CM | POA: Diagnosis not present

## 2018-01-15 DIAGNOSIS — H353221 Exudative age-related macular degeneration, left eye, with active choroidal neovascularization: Secondary | ICD-10-CM | POA: Diagnosis not present

## 2018-01-15 DIAGNOSIS — N179 Acute kidney failure, unspecified: Secondary | ICD-10-CM | POA: Diagnosis not present

## 2018-01-15 DIAGNOSIS — N184 Chronic kidney disease, stage 4 (severe): Secondary | ICD-10-CM | POA: Diagnosis not present

## 2018-01-15 DIAGNOSIS — L03116 Cellulitis of left lower limb: Secondary | ICD-10-CM | POA: Diagnosis not present

## 2018-01-15 DIAGNOSIS — R2689 Other abnormalities of gait and mobility: Secondary | ICD-10-CM | POA: Diagnosis not present

## 2018-01-15 DIAGNOSIS — E785 Hyperlipidemia, unspecified: Secondary | ICD-10-CM | POA: Diagnosis not present

## 2018-01-15 DIAGNOSIS — I10 Essential (primary) hypertension: Secondary | ICD-10-CM | POA: Diagnosis not present

## 2018-01-15 DIAGNOSIS — J309 Allergic rhinitis, unspecified: Secondary | ICD-10-CM | POA: Diagnosis not present

## 2018-01-15 DIAGNOSIS — R062 Wheezing: Secondary | ICD-10-CM | POA: Diagnosis not present

## 2018-01-15 DIAGNOSIS — I129 Hypertensive chronic kidney disease with stage 1 through stage 4 chronic kidney disease, or unspecified chronic kidney disease: Secondary | ICD-10-CM | POA: Diagnosis not present

## 2018-01-15 DIAGNOSIS — L03119 Cellulitis of unspecified part of limb: Secondary | ICD-10-CM | POA: Diagnosis not present

## 2018-01-15 LAB — RENAL FUNCTION PANEL
ANION GAP: 5 (ref 5–15)
Albumin: 2.6 g/dL — ABNORMAL LOW (ref 3.5–5.0)
BUN: 31 mg/dL — ABNORMAL HIGH (ref 8–23)
CO2: 25 mmol/L (ref 22–32)
Calcium: 8.4 mg/dL — ABNORMAL LOW (ref 8.9–10.3)
Chloride: 109 mmol/L (ref 98–111)
Creatinine, Ser: 1.69 mg/dL — ABNORMAL HIGH (ref 0.44–1.00)
GFR calc Af Amer: 29 mL/min — ABNORMAL LOW (ref >60)
GFR calc non Af Amer: 25 mL/min — ABNORMAL LOW (ref >60)
GLUCOSE: 113 mg/dL — AB (ref 70–99)
POTASSIUM: 4.1 mmol/L (ref 3.5–5.1)
Phosphorus: 3.4 mg/dL (ref 2.5–4.6)
SODIUM: 139 mmol/L (ref 135–145)

## 2018-01-15 LAB — CULTURE, BLOOD (ROUTINE X 2)
Culture: NO GROWTH
Culture: NO GROWTH
Special Requests: ADEQUATE

## 2018-01-15 MED ORDER — TRAMADOL HCL 50 MG PO TABS: 50.0000 mg | ORAL_TABLET | Freq: Two times a day (BID) | ORAL | 5 refills | Status: DC | PRN

## 2018-01-15 NOTE — Telephone Encounter (Signed)
Pt daughter called to cancel's pt appointment on 01/18/18. Pt is admitted in the hospital right now and will be going to a rehabilitation center either today or tomorrow. Pt daughter said she will most likely be going to [facility].

## 2018-01-15 NOTE — Plan of Care (Signed)
  Problem: Clinical Measurements: Goal: Ability to avoid or minimize complications of infection will improve Outcome: Progressing   Problem: Skin Integrity: Goal: Skin integrity will improve Outcome: Progressing   Problem: Clinical Measurements: Goal: Will remain free from infection Outcome: Progressing   Problem: Activity: Goal: Risk for activity intolerance will decrease Outcome: Progressing   Problem: Elimination: Goal: Will not experience complications related to bowel motility Outcome: Progressing   Problem: Safety: Goal: Ability to remain free from injury will improve Outcome: Progressing

## 2018-01-15 NOTE — Progress Notes (Signed)
RN paged FM to get discharge for patient so she can discharge to [facility].

## 2018-01-15 NOTE — Progress Notes (Signed)
Occupational Therapy Treatment Patient Details Name: [name] MRN: [id number] DOB: 11/25/1923 Today's Date: 01/15/2018    History of present illness Patient is a 84 y/o female presenting to the ED with priamry complaints of L foot pain. Tib/fib and left foot x-ray showing acute nondisplaced transverse fracture through the base of the left fifth metatarsal with mild soft tissue swelling of the dorsal aspect of the left foot. DVT ruled out with negative doppler. PMH significant for HFpEF, GERD, A. Fib on eliquis, Hypercholesterolemia, memory loss, urge incontinence, history of myocardial infarction, sick sinus syndrome, COPD, and lumbar spinal stenosis.   OT comments  Pt progressing towards OT goals this session, Pt able to don post-op shoe independently, min guard for transfers and able to complete sink level grooming at min guard level. DC plan changed as Pt did not have 24 hour supervision (Pt's daughter is a Catering manager). OT will continue to follow acutely, and at post-acute location. This patient continues to be highly motivated, and very pleasant, thank you for the opportunity to work with this patient.    Follow Up Recommendations  SNF;Supervision/Assistance - 24 hour    Equipment Recommendations  Tub/shower bench    Recommendations for Other Services      Precautions / Restrictions Precautions Precautions: Fall Precaution Comments: watch SOB and O2 levels Required Braces or Orthoses: Other Brace/Splint Other Brace/Splint: post-op shoe Restrictions Weight Bearing Restrictions: No       Mobility Bed Mobility               General bed mobility comments: up in recliner  Transfers Overall transfer level: Needs assistance Equipment used: Rolling walker (2 wheeled) Transfers: Sit to/from Stand Sit to Stand: Min guard         General transfer comment: min guard for safety    Balance Overall balance assessment: Needs assistance Sitting-balance support: No  upper extremity supported;Feet supported Sitting balance-Leahy Scale: Good     Standing balance support: Bilateral upper extremity supported;During functional activity Standing balance-Leahy Scale: Poor Standing balance comment: reliant on RW for ambulation                           ADL either performed or assessed with clinical judgement   ADL Overall ADL's : Needs assistance/impaired     Grooming: Wash/dry face;Oral care;Min guard;Standing Grooming Details (indicate cue type and reason): decreased activity tolerance in standing, educated that performing tasks in sitting is always an option for pain and for energy conservation                 Toilet Transfer: Ambulation;RW;Min Psychiatric nurse Details (indicate cue type and reason): vc for safe hand placement Toileting- Clothing Manipulation and Hygiene: Supervision/safety;Sitting/lateral lean       Functional mobility during ADLs: Min guard;Rolling walker(SOB, saturations hover around 90) General ADL Comments: decreased activity tolerance     Vision       Perception     Praxis      Cognition Arousal/Alertness: Awake/alert Behavior During Therapy: WFL for tasks assessed/performed Overall Cognitive Status: Within Functional Limits for tasks assessed                                          Exercises     Shoulder Instructions       General Comments  Pertinent Vitals/ Pain       Pain Assessment: Faces Faces Pain Scale: Hurts a little bit Pain Location: L foot Pain Descriptors / Indicators: Discomfort;Guarding Pain Intervention(s): Monitored during session;Repositioned  Home Living                                          Prior Functioning/Environment              Frequency  Min 2X/week        Progress Toward Goals  OT Goals(current goals can now be found in the care plan section)  Progress towards OT goals: Progressing toward  goals  Acute Rehab OT Goals Patient Stated Goal: keep getting better OT Goal Formulation: With patient Time For Goal Achievement: 01/25/18 Potential to Achieve Goals: Good  Plan Frequency remains appropriate;Discharge plan needs to be updated    Co-evaluation                 AM-PAC PT "6 Clicks" Daily Activity     Outcome Measure   Help from another person eating meals?: None Help from another person taking care of personal grooming?: None Help from another person toileting, which includes using toliet, bedpan, or urinal?: A Little Help from another person bathing (including washing, rinsing, drying)?: A Little Help from another person to put on and taking off regular upper body clothing?: None Help from another person to put on and taking off regular lower body clothing?: None 6 Click Score: 22    End of Session Equipment Utilized During Treatment: Gait belt;Rolling walker  OT Visit Diagnosis: Unsteadiness on feet (R26.81);Other abnormalities of gait and mobility (R26.89);Pain Pain - Right/Left: Left Pain - part of body: Ankle and joints of foot   Activity Tolerance Patient tolerated treatment well   Patient Left in chair;with call bell/phone within reach;with nursing/sitter in room;with chair alarm set   Nurse Communication Mobility status        Time: 1120-1140 OT Time Calculation (min): 20 min  Charges: OT General Charges $OT Visit: 1 Visit OT Treatments $Self Care/Home Management : 8-22 mins  Hulda Humphrey OTR/L Acute Rehabilitation Services Pager: [phone] Office: [place] 01/15/2018, 11:43 AM

## 2018-01-15 NOTE — Consult Note (Signed)
   [facility] CM Inpatient Consult   01/15/2018  AUNDRIA BITTERMAN 08/04/1923 [id number]    [facility] Care Management follow up.  Chart reviewed. Noted Mrs. Schepp will discharge to [facility] today.   Will make referral to [facility] LCSW for follow up while at SNF since patient was active with [facility] prior to hospitalization.   Marthenia Rolling, MSN-Ed, RN,BSN [facility] Liaison [phone]

## 2018-01-15 NOTE — Clinical Social Work Placement (Signed)
   CLINICAL SOCIAL WORK PLACEMENT  NOTE  Date:  01/15/2018  Patient Details  Name: [name] MRN: [id number] Date of Birth: 1923/07/05  Clinical Social Work is seeking post-discharge placement for this patient at the [place] level of care (*CSW will initial, date and re-position this form in  chart as items are completed):      Patient/family provided with [place] Work Department's list of facilities offering this level of care within the geographic area requested by the patient (or if unable, by the patient's family).  Yes   Patient/family informed of their freedom to choose among providers that offer the needed level of care, that participate in Medicare, Medicaid or managed care program needed by the patient, have an available bed and are willing to accept the patient.  Yes   Patient/family informed of [facility]'s ownership interest in [facility] and [facility], as well as of the fact that they are under no obligation to receive care at these facilities.  PASRR submitted to EDS on       PASRR number received on 01/12/18     Existing PASRR number confirmed on       FL2 transmitted to all facilities in geographic area requested by pt/family on 01/12/18     FL2 transmitted to all facilities within larger geographic area on       Patient informed that his/her managed care company has contracts with or will negotiate with certain facilities, including the following:        Yes   Patient/family informed of bed offers received.  Patient chooses bed at [facility]     Physician recommends and patient chooses bed at      Patient to be transferred to [facility] on 01/15/18.  Patient to be transferred to facility by PTAR     Patient family notified on 01/15/18 of transfer.  Name of family member notified:  [name] (daughter)     PHYSICIAN       Additional Comment:    _______________________________________________ Alberteen Sam, LCSW 01/15/2018, 1:22 PM

## 2018-01-15 NOTE — Progress Notes (Addendum)
Family Medicine Teaching Service Daily Progress Note Intern Pager: [phone]  Patient name: [name] Medical record number: [id number] Date of birth: April 12, 1923 Age: 84 y.o. Gender: female  Primary Care Provider: McDiarmid, Blane Ohara, MD Consultants: PT/OT, CSW, WOC Code Status: full  Pt Overview and Major Events to Date:  11/13 - admitted to [facility] for pain control  Assessment and Plan: [employer] a 84 y.o.femalepresenting with cellulitis and left metatarsal fracture. PMH is significant forHFpEF, GERD, A. Fib oneliquis, Hypercholesterolemia,memory loss, urge incontinence, history of myocardial infarction, sick sinus syndrome, COPD, and lumbar spinal stenosis.  L footCellulitisand fifth metatarsal Fx, stable:Blood cx negative x 3 days. PT/OT recommending SNF. Abx course completed. Patient is medically cleared for discharge to SNF. -Wound care consulted to assess non-healing wound of L dorsal foot -Tylenol as needed mild to moderate pain; tramadol for moderate to severe pain -Up with assistance -Hard-sole shoe for ambulation -Vitals per protocol -Continue to follow up with CSW for rehab placement  Acute on CKD 4 w/ hx of nephrectomy, improving, stable: Cr trending up, GFR trending down. S/p nephrectomy. Baseline appears to be ~ 1.6. 2.25 > 1.69 and GFR 18 > 25 on 11/18. Cr has gone up to 2.7 in 2017. Patient reports normal urine output. No urine output charted over past 48 hours. FeNa 0.1%. -Nephro consulted - appreciate recs: "suspect diuretic related. Lasix dc'd , agree w/ IVF at 75/hr for now.  No other renal toxins noted, BP's ok. Check urine lytes.  Creat is only slightly out of her range (1.6- 2.1) so would not try to normalize too much as she was here in Aug w/ CHF exac after lasix was held in July" -start gentle IV fluids 75 cc/hr for 10 hours -will c/s nephrology as patient has solitary kidney -obtain urine cr, sodium to calculate Fena -am BMP -strict I/Os  A fib,  chronic, stable:rate controlled. Home meds:amiodarone 200 mg daily Monday through Friday and Eliquis 2.5 mg BID -Continuous cardiac monitoring -Continue Eliquis and Amiodarone (as today is Monday) -Vitals per protocol  COPD, chronic, stable: chronic, stable; well-controlled - Albuterol neb PRN - Pulmicort BID - DuoNeb PRN  HFpEF, chronic, stable: urine outputnot reported 11/15-11/16 -Lasix discontinued due to increasing Creatinine - restart once Creatinine decreases slightly -Vitals per protocol -Restart Lasix on discharge and follow up with BMP in 1 week post discharge.  H/o MI and sick sinus syndrome with pacemaker, chronic, stable: patient without chest pain since admission -Continue metoprolol and Lipitor daily -Nitroglycerinprnchest pain  GERD, chronic, stable: home meds: omeprazole at home. -Protonixwhile inpatient  FEN/GI:Saline lock, heart healthy diet, Miralax Prophylaxis: homeeliquis  Disposition: Patient is medically cleared for discharge to SNF - she prefers [facility] or [facility]  Subjective:  Patient seen this morning sitting upright in her recliner.  She is resting and breathing easily.  She denies chest pain, shortness of breath, pain, urinary symptoms, and pain.  She reports that her swelling has gone down her left leg feels much better.  She states she is ambulating well with the hard-soled shoe.  Objective: Temp:  [97.9 F (36.6 C)-98.2 F (36.8 C)] 98.2 F (36.8 C) (11/17 1945) Pulse Rate:  [73-76] 76 (11/17 1945) Resp:  [18] 18 (11/17 1945) BP: (109-127)/(66-69) 109/66 (11/17 1945) SpO2:  [98 %-99 %] 99 % (11/17 1945)  Physical Exam  Constitutional: She is oriented to person, place, and time. She appears well-developed and well-nourished.  Frail, pleasant  Cardiovascular: Normal rate.  Irregularly irregular rhythm  Pulmonary/Chest: Effort normal and breath sounds normal.  Musculoskeletal: Normal range of motion. She exhibits edema  (L 1+ edema; R trace edema).  Hard-soled shoe in place to L foot  Neurological: She is alert and oriented to person, place, and time.  Skin: Skin is warm and dry. Capillary refill takes less than 2 seconds.  Psychiatric: She has a normal mood and affect.   Laboratory: Recent Labs  Lab 01/11/18 0130 01/12/18 0710 01/13/18 0356  WBC 7.7 5.7 5.6  HGB 10.3* 10.4* 9.7*  HCT 35.4* 33.9* 32.1*  PLT 196 182 186   Recent Labs  Lab 01/10/18 1521  01/13/18 0356 01/14/18 0332 01/15/18 0328  NA 137   < > 137 141 139  K 3.8   < > 4.1 4.0 4.1  CL 98   < > 104 105 109  CO2 26   < > 28 28 25   BUN 35*   < > 38* 36* 31*  CREATININE 1.76*   < > 2.06* 2.25* 1.69*  CALCIUM 9.0   < > 8.4* 8.5* 8.4*  PROT 6.6  --   --   --   --   BILITOT 1.2  --   --   --   --   ALKPHOS 60  --   --   --   --   ALT 24  --   --   --   --   AST 30  --   --   --   --   GLUCOSE 108*   < > 117* 114* 113*   < > = values in this interval not displayed.    Imaging/Diagnostic Tests: Dg Tibia/fibula Left  Result Date: 01/10/2018 CLINICAL DATA:  Initial evaluation for acute pain for 1 week, no injury. EXAM: LEFT TIBIA AND FIBULA - 2 VIEW COMPARISON:  Prior radiograph of the left foot from 11/29/2017. FINDINGS: No acute fracture or dislocation about the left tibia/fibula. Left total knee arthroplasty in place without obvious complication. Bones are osteopenic. Mild soft tissue swelling anterior to the left ankle, improved from prior radiograph. There is a new acute transverse nondisplaced fracture through the base of the right fifth metatarsal. IMPRESSION: 1. Acute transverse nondisplaced fracture through the base of the right fifth metatarsal. Finding better evaluated on concomitant radiograph of the left foot. 2. No acute fracture or dislocation about the left tibia/fibula. 3. Left knee arthroplasty in place without complication. Electronically Signed   By: Jeannine Boga M.D.   On: 01/10/2018 16:20   Dg Foot  Complete Left  Result Date: 01/10/2018 CLINICAL DATA:  Initial evaluation for acute pain, no known injury, soft tissue swelling at the dorsal aspect of the foot. EXAM: LEFT FOOT - COMPLETE 3+ VIEW COMPARISON:  Prior radiograph from 11/29/2017. FINDINGS: Acute transverse nondisplaced fracture through the base of the left fifth metatarsal, new relative to recent radiograph. No other acute fracture or dislocation. Joint spaces maintained without evidence for significant degenerative or erosive arthropathy. Mild soft tissue swelling at the dorsal aspect of the foot. Tiny posterior and plantar calcaneal enthesophytes noted. Osteopenia. IMPRESSION: 1. Acute nondisplaced transverse fracture through the base of the left fifth metatarsal. 2. Mild soft tissue swelling at the dorsal aspect of the left foot. Electronically Signed   By: Jeannine Boga M.D.   On: 01/10/2018 16:22   Vas [place] Lower Extremity Venous (dvt) (only Mc & Wl)  Result Date: 01/10/2018  Lower Venous Study Indications: Pain, Swelling, and Erythema.  Risk Factors: Trauma Broken  left foot. Performing Technologist: Toma Copier RVS  Examination Guidelines: A complete evaluation includes B-mode imaging, spectral Doppler, color Doppler, and power Doppler as needed of all accessible portions of each vessel. Bilateral testing is considered an integral part of a complete examination. Limited examinations for reoccurring indications may be performed as noted.  Right Venous Findings: +---+---------------+---------+-----------+----------+-------+    CompressibilityPhasicitySpontaneityPropertiesSummary +---+---------------+---------+-----------+----------+-------+ CFVFull           Yes      Yes                          +---+---------------+---------+-----------+----------+-------+ SFJFull                                                 +---+---------------+---------+-----------+----------+-------+  Left Venous Findings:  +---------+---------------+---------+-----------+----------+-------+          CompressibilityPhasicitySpontaneityPropertiesSummary +---------+---------------+---------+-----------+----------+-------+ CFV      Full           Yes      Yes                          +---------+---------------+---------+-----------+----------+-------+ SFJ      Full                                                 +---------+---------------+---------+-----------+----------+-------+ FV Prox  Full           Yes      Yes                          +---------+---------------+---------+-----------+----------+-------+ FV Mid   Full                                                 +---------+---------------+---------+-----------+----------+-------+ FV DistalFull           Yes      Yes                          +---------+---------------+---------+-----------+----------+-------+ PFV      Full           Yes      Yes                          +---------+---------------+---------+-----------+----------+-------+ POP      Full           Yes      Yes                          +---------+---------------+---------+-----------+----------+-------+ PTV      Full                                                 +---------+---------------+---------+-----------+----------+-------+ PERO     Full                                                 +---------+---------------+---------+-----------+----------+-------+  Summary: Right: There is no evidence of a common femoral vein obstruction. Left: There is no evidence of deep vein thrombosis in the lower extremity. No cystic structure found in the popliteal fossa.  *See table(s) above for measurements and observations. Electronically signed by Harold Barban MD on 01/10/2018 at 7:14:32 PM.    Final     Daisy Floro, DO 01/15/2018, 8:03 AM PGY-1, [place] Intern pager: [phone], text pages welcome

## 2018-01-15 NOTE — Progress Notes (Signed)
Patient ID: [name], female   DOB: [birth year], 84 y.o.   MRN: [id number] [facility] Progress Note   Assessment/ Plan:   1.  Acute kidney injury on chronic kidney disease stage IV: The acute component is suspected to have been hemodynamically mediated in the setting of recent cellulitis/diuretic use.  Renal function improved overnight and appears to be back to baseline. 2.  Left lower extremity cellulitis: Transitioned over to oral antibiotics after initial intravenous 3.  Atrial fibrillation: She is in persistent atrial fibrillation but rate controlled on amiodarone, beta-blocker and Eliquis. 4.  Hypertension: Blood pressures appeared to be well controlled at this time.  She may restart furosemide 20 mg tomorrow if renal function continues to remain at baseline.  Will sign off at this time.  Subjective:   Reports to be feeling better and excited to learn about improving renal function.   Objective:   BP 109/66 (BP Location: Right Arm)   Pulse 76   Temp 98.2 F (36.8 C) (Oral)   Resp 18   Ht 5\' 7"  (1.702 m)   Wt 74.8 kg   SpO2 99%   BMI 25.84 kg/m   Intake/Output Summary (Last 24 hours) at 01/15/2018 1134 Last data filed at 01/15/2018 1638 Gross per 24 hour  Intake 1244.06 ml  Output -  Net 1244.06 ml   Weight change:   Physical Exam: Gen: Sitting comfortably in recliner CVS: Pulse irregular rhythm, normal rate, S1 and S2 normal. Resp: Clear to auscultation, no rales/rhonchi Abd: Soft, obese, nontender Ext: Left leg in supportive slipper.  No pitting edema.  Imaging: No results found.  Labs: BMET Recent Labs  Lab 01/10/18 1521 01/11/18 0130 01/12/18 0710 01/13/18 0356 01/14/18 0332 01/15/18 0328  NA 137 139 138 137 141 139  K 3.8 4.0 4.0 4.1 4.0 4.1  CL 98 104 103 104 105 109  CO2 26 25 27 28 28 25   GLUCOSE 108* 126* 99 117* 114* 113*  BUN 35* 35* 36* 38* 36* 31*  CREATININE 1.76* 1.69* 1.94* 2.06* 2.25* 1.69*  CALCIUM 9.0 8.5* 8.7* 8.4*  8.5* 8.4*  PHOS  --   --   --   --   --  3.4   CBC Recent Labs  Lab 01/10/18 1521 01/11/18 0130 01/12/18 0710 01/13/18 0356  WBC 7.9 7.7 5.7 5.6  NEUTROABS 5.5  --   --   --   HGB 11.9* 10.3* 10.4* 9.7*  HCT 39.3 35.4* 33.9* 32.1*  MCV 95.4 94.7 94.4 95.0  PLT 197 196 182 186   Medications:    . amiodarone  200 mg Oral Once per day on Mon Tue Wed Thu Fri  . apixaban  2.5 mg Oral BID  . atorvastatin  20 mg Oral Daily  . budesonide  0.5 mg Nebulization BID  . fluticasone  2 spray Each Nare Daily  . Glycerin (Adult)  1 suppository Rectal Once  . metoprolol succinate  25 mg Oral Daily  . oxyCODONE-acetaminophen  0.5 tablet Oral Once  . pantoprazole  40 mg Oral Daily   Elmarie Shiley, MD 01/15/2018, 11:34 AM

## 2018-01-15 NOTE — Progress Notes (Signed)
Patient experienced shortness of breath on exertion to the bathroom and back to bed. Patient sat up in bed and did incentive spirometer. Pulse ox at 92.

## 2018-01-15 NOTE — Telephone Encounter (Signed)
Will forward to MD. Laquonda Welby,CMA  

## 2018-01-15 NOTE — Progress Notes (Signed)
RN called and gave report Webb Silversmith, Therapist, sports at [facility], pt aware. Will remove IV and tele when PTAR arrives. Pt daughter at bedside packing belongings.

## 2018-01-15 NOTE — Progress Notes (Signed)
Patient will DC to: [facility] Anticipated DC date: 01/15/18 Family notified: [name] Transport by: Corey Harold  Per MD patient ready for DC to [facility] . RN, patient, patient's family, and facility notified of DC. Discharge Summary sent to facility. RN given number for report [phone] Room 610A. DC packet on chart. Ambulance transport requested for patient.  CSW signing off.  [place], [place]

## 2018-01-15 NOTE — Consult Note (Signed)
[facility] Nurse wound consult note Reason for Consult: Fracture in left foot. Patient thinks she dropped something on her foot.  Nonhealing hematoma to left dorsal foot. Bruising present to left lateral foot.  No erythema or drainage to wound on left dorsal foot. IS on Keflex for cellulitis Wound type:infectious/trauma/fracture to left foot.  Pressure Injury POA: NA Measurement: 0.5 cm x 1 cm hematoma with surrounding induration and warmth. Edema to left foot and bruising to lateral foot Wound QJS:[id number], intact Drainage (amount, consistency, odor) none Periwound:edema and bruising Dressing procedure/placement/frequency: Silicone foam to left dorsal foot wound.  To wear orthotic shoe for ambulation.  Will not follow at this time.  Please re-consult if needed.  Domenic Moras MSN, RN, FNP-BC CWON Wound, Ostomy, Continence Nurse Pager [phone]

## 2018-01-17 ENCOUNTER — Other Ambulatory Visit: Payer: Self-pay | Admitting: Licensed Clinical Social Worker

## 2018-01-17 DIAGNOSIS — R600 Localized edema: Secondary | ICD-10-CM | POA: Diagnosis not present

## 2018-01-17 DIAGNOSIS — J449 Chronic obstructive pulmonary disease, unspecified: Secondary | ICD-10-CM | POA: Diagnosis not present

## 2018-01-17 DIAGNOSIS — K219 Gastro-esophageal reflux disease without esophagitis: Secondary | ICD-10-CM | POA: Diagnosis not present

## 2018-01-17 DIAGNOSIS — E785 Hyperlipidemia, unspecified: Secondary | ICD-10-CM | POA: Diagnosis not present

## 2018-01-17 DIAGNOSIS — S92355D Nondisplaced fracture of fifth metatarsal bone, left foot, subsequent encounter for fracture with routine healing: Secondary | ICD-10-CM | POA: Diagnosis not present

## 2018-01-17 DIAGNOSIS — M6281 Muscle weakness (generalized): Secondary | ICD-10-CM | POA: Diagnosis not present

## 2018-01-17 DIAGNOSIS — J309 Allergic rhinitis, unspecified: Secondary | ICD-10-CM | POA: Diagnosis not present

## 2018-01-17 DIAGNOSIS — L03116 Cellulitis of left lower limb: Secondary | ICD-10-CM | POA: Diagnosis not present

## 2018-01-17 DIAGNOSIS — I4891 Unspecified atrial fibrillation: Secondary | ICD-10-CM | POA: Diagnosis not present

## 2018-01-17 DIAGNOSIS — I10 Essential (primary) hypertension: Secondary | ICD-10-CM | POA: Diagnosis not present

## 2018-01-17 NOTE — Patient Outreach (Signed)
[place] [facility]) Care Management  [facility] Social Work  01/17/2018  [name] 1923-11-08 [id number]  Encounter Medications:  Outpatient Encounter Medications as of 01/17/2018  Medication Sig Note  . albuterol (PROVENTIL) (2.5 MG/3ML) 0.083% nebulizer solution Take 3 mLs (2.5 mg total) by nebulization every 6 (six) hours as needed for wheezing or shortness of breath. 01/10/2018: LF 10.29.19  . amiodarone (PACERONE) 200 MG tablet Take one tablet by mouth daily Monday through Friday.  Do not take on Saturday or Sunday. 01/10/2018: LF 10.23.19 DS 45 #90  . apixaban (ELIQUIS) 2.5 MG TABS tablet Take 1 tablet (2.5 mg total) by mouth 2 (two) times daily. 01/10/2018: LF 10.23.19 DS 30 #60  . atorvastatin (LIPITOR) 20 MG tablet TAKE 1 TABLET BY MOUTH DAILY   . budesonide (PULMICORT) 0.5 MG/2ML nebulizer solution Take 2 mLs (0.5 mg total) by nebulization 2 (two) times daily.   . fluticasone (FLONASE) 50 MCG/ACT nasal spray SHAKE LIQUID AND USE 2 SPRAYS IN EACH NOSTRIL DAILY 10/17/2017: LF 04/29/17  . furosemide (LASIX) 20 MG tablet Take 1 tablet (20 mg total) by mouth daily. 01/10/2018: LF 8.19.19 DS 90  . ipratropium-albuterol (DUONEB) 0.5-2.5 (3) MG/3ML SOLN USE 3 ML VIA NEBULIZER EVERY 4 HOURS 10/17/2017: LF 08/28/17  . metoprolol succinate (TOPROL-XL) 25 MG 24 hr tablet Take 25 mg by mouth daily. 01/10/2018: LF 8.24.19 DS 90 #180  . nitroGLYCERIN (NITROSTAT) 0.4 MG SL tablet Place 1 tablet (0.4 mg total) under the tongue every 5 (five) minutes as needed for chest pain (x 3 tabs).   Marland Kitchen omeprazole (PRILOSEC) 20 MG capsule Take 1 capsule (20 mg total) by mouth daily. 01/10/2018: LF 10.23.19 DS 90  . traMADol (ULTRAM) 50 MG tablet Take 1 tablet (50 mg total) by mouth every 12 (twelve) hours as needed. for pain   . vitamin B-12 (CYANOCOBALAMIN) 1000 MCG tablet Take 1 tablet (1,000 mcg total) by mouth daily. (Patient not taking: Reported on 01/10/2018)    No facility-administered encounter  medications on file as of 01/17/2018.     Functional Status:  In your present state of health, do you have any difficulty performing the following activities: 01/10/2018 10/16/2017  Hearing? Y -  Comment a lttle bit -  Vision? N -  Difficulty concentrating or making decisions? N -  Walking or climbing stairs? N -  Dressing or bathing? N -  Doing errands, shopping? N N  Some recent data might be hidden    Fall/Depression Screening:  PHQ 2/9 Scores 12/28/2017 12/06/2017 11/02/2017 09/21/2017 09/15/2017 06/29/2017 06/08/2017  PHQ - 2 Score 0 0 0 0 0 0 3  PHQ- 9 Score - - - - - - 5    Assessment: [facility] CSW arrived at [facility] and successfully completed [facility] Consult with patient and daughter [name] after receiving referral on 01/16/18. [facility] CSW introduced self, reason for visit and of [facility] CM Services. Consent signed. Patient reports that she has a strong support network of family members. She lives at home with one of her daughters [name] but [name] is not there that often because she is a Catering manager. [name] reports being retired and is available to be there for patient as needed. Patient denies having an aide at home. Patient shares that she had a [place] aide in the past through [facility] but this service was covered by Medicare. Patient states that she does not wish to pay out of pocket for an aide unless she absolutely needs it. Family denied wanting private  pay list as they would prefer to find an aide that is not attached to an agency because the cost would be too expensive. Patient repots having 4 daughters who all are involved in her case. She shares that her daughter [name] is her POA. Patient denies having a ramp. Patient reports having stable transportation through her daughters. Patient reports that she may temporarily stay with her daughter [name] during the week and another daughter on the weekends post SNF discharge if they are unable to find a reasonably priced aide. Patient reports that her  bedroom is on the second level of her residence which is a barrier. Patient reports not being able to bear weight on her feet yet but is hopeful to be able to do so before she is discharged from facility. Patient reports having one medication that is very expensive for her and feels that she would benefit from possible pharmacy involvement post SNF discharge. Family agreeable to [place].  Plan: [facility] CSW will follow up within two weeks and will monitor PING and await for expected SNF discharge.  Eula Fried, BSW, MSW, [email or web address] Phone: [phone] Fax: [phone]

## 2018-01-18 ENCOUNTER — Ambulatory Visit: Payer: Medicare Other | Admitting: Family Medicine

## 2018-01-19 ENCOUNTER — Telehealth: Payer: Self-pay

## 2018-01-19 ENCOUNTER — Other Ambulatory Visit: Payer: Self-pay | Admitting: Licensed Clinical Social Worker

## 2018-01-19 DIAGNOSIS — E785 Hyperlipidemia, unspecified: Secondary | ICD-10-CM | POA: Diagnosis not present

## 2018-01-19 DIAGNOSIS — I10 Essential (primary) hypertension: Secondary | ICD-10-CM | POA: Diagnosis not present

## 2018-01-19 DIAGNOSIS — L03116 Cellulitis of left lower limb: Secondary | ICD-10-CM | POA: Diagnosis not present

## 2018-01-19 DIAGNOSIS — K219 Gastro-esophageal reflux disease without esophagitis: Secondary | ICD-10-CM | POA: Diagnosis not present

## 2018-01-19 DIAGNOSIS — S92355D Nondisplaced fracture of fifth metatarsal bone, left foot, subsequent encounter for fracture with routine healing: Secondary | ICD-10-CM | POA: Diagnosis not present

## 2018-01-19 DIAGNOSIS — J309 Allergic rhinitis, unspecified: Secondary | ICD-10-CM | POA: Diagnosis not present

## 2018-01-19 DIAGNOSIS — J449 Chronic obstructive pulmonary disease, unspecified: Secondary | ICD-10-CM | POA: Diagnosis not present

## 2018-01-19 DIAGNOSIS — I4891 Unspecified atrial fibrillation: Secondary | ICD-10-CM | POA: Diagnosis not present

## 2018-01-19 DIAGNOSIS — R062 Wheezing: Secondary | ICD-10-CM | POA: Diagnosis not present

## 2018-01-19 DIAGNOSIS — M6281 Muscle weakness (generalized): Secondary | ICD-10-CM | POA: Diagnosis not present

## 2018-01-19 DIAGNOSIS — R600 Localized edema: Secondary | ICD-10-CM | POA: Diagnosis not present

## 2018-01-19 NOTE — Telephone Encounter (Signed)
Patient daughter, [name], called. Mother being D/C'ed in a few days from rehab and coming to stay with her. Has appt with PCP 12/5 but [name] wonders if she can speak with PCP about a couple of questions related to a medication and PT, OT. [name] callback is [phone]  Danley Danker, RN [facility] RN)

## 2018-01-19 NOTE — Patient Outreach (Signed)
[name]) Care Management  01/19/2018  [name] 24-Oct-1923 [id number]  [facility] CSW sent a secure email to [name] SNF social worker [place] on 01/19/18. [facility] CSW informed her that [name] is following patient during her short term stay and would like to coordinate care as needed in the future.   [name], [name], [name], [name] Phone: [phone] Fax: [phone]

## 2018-01-22 ENCOUNTER — Telehealth: Payer: Self-pay | Admitting: Cardiology

## 2018-01-22 DIAGNOSIS — J309 Allergic rhinitis, unspecified: Secondary | ICD-10-CM | POA: Diagnosis not present

## 2018-01-22 DIAGNOSIS — K219 Gastro-esophageal reflux disease without esophagitis: Secondary | ICD-10-CM | POA: Diagnosis not present

## 2018-01-22 DIAGNOSIS — I4891 Unspecified atrial fibrillation: Secondary | ICD-10-CM | POA: Diagnosis not present

## 2018-01-22 DIAGNOSIS — S92355D Nondisplaced fracture of fifth metatarsal bone, left foot, subsequent encounter for fracture with routine healing: Secondary | ICD-10-CM | POA: Diagnosis not present

## 2018-01-22 DIAGNOSIS — I10 Essential (primary) hypertension: Secondary | ICD-10-CM | POA: Diagnosis not present

## 2018-01-22 DIAGNOSIS — L03116 Cellulitis of left lower limb: Secondary | ICD-10-CM | POA: Diagnosis not present

## 2018-01-22 DIAGNOSIS — M6281 Muscle weakness (generalized): Secondary | ICD-10-CM | POA: Diagnosis not present

## 2018-01-22 DIAGNOSIS — R600 Localized edema: Secondary | ICD-10-CM | POA: Diagnosis not present

## 2018-01-22 DIAGNOSIS — E785 Hyperlipidemia, unspecified: Secondary | ICD-10-CM | POA: Diagnosis not present

## 2018-01-22 DIAGNOSIS — J449 Chronic obstructive pulmonary disease, unspecified: Secondary | ICD-10-CM | POA: Diagnosis not present

## 2018-01-22 NOTE — Telephone Encounter (Signed)
Confirmed remote transmission w/ pt daughter.   

## 2018-01-23 ENCOUNTER — Other Ambulatory Visit: Payer: Self-pay | Admitting: Licensed Clinical Social Worker

## 2018-01-23 ENCOUNTER — Encounter: Payer: Self-pay | Admitting: Cardiology

## 2018-01-23 DIAGNOSIS — H353221 Exudative age-related macular degeneration, left eye, with active choroidal neovascularization: Secondary | ICD-10-CM | POA: Diagnosis not present

## 2018-01-23 DIAGNOSIS — H353211 Exudative age-related macular degeneration, right eye, with active choroidal neovascularization: Secondary | ICD-10-CM | POA: Diagnosis not present

## 2018-01-23 NOTE — Telephone Encounter (Signed)
Unfortunately, Dr Jameire Kouba is unable to share health information about Mrs Brendlinger.    Please ask Ms Johna Kearl let us know what information she would like to share with Dr Izel Hochberg before the 02/01/18 office appointment.

## 2018-01-23 NOTE — Patient Outreach (Signed)
[place] [facility]) Care Management  [facility] Social Work  01/23/2018  EDNA REDE [birth year] [id number]  Encounter Medications:  Outpatient Encounter Medications as of 01/23/2018  Medication Sig Note  . albuterol (PROVENTIL) (2.5 MG/3ML) 0.083% nebulizer solution Take 3 mLs (2.5 mg total) by nebulization every 6 (six) hours as needed for wheezing or shortness of breath. 01/10/2018: LF 10.29.19  . amiodarone (PACERONE) 200 MG tablet Take one tablet by mouth daily Monday through Friday.  Do not take on Saturday or Sunday. 01/10/2018: LF 10.23.19 DS 45 #90  . apixaban (ELIQUIS) 2.5 MG TABS tablet Take 1 tablet (2.5 mg total) by mouth 2 (two) times daily. 01/10/2018: LF 10.23.19 DS 30 #60  . atorvastatin (LIPITOR) 20 MG tablet TAKE 1 TABLET BY MOUTH DAILY   . budesonide (PULMICORT) 0.5 MG/2ML nebulizer solution Take 2 mLs (0.5 mg total) by nebulization 2 (two) times daily.   . fluticasone (FLONASE) 50 MCG/ACT nasal spray SHAKE LIQUID AND USE 2 SPRAYS IN EACH NOSTRIL DAILY 10/17/2017: LF 04/29/17  . furosemide (LASIX) 20 MG tablet Take 1 tablet (20 mg total) by mouth daily. 01/10/2018: LF 8.19.19 DS 90  . ipratropium-albuterol (DUONEB) 0.5-2.5 (3) MG/3ML SOLN USE 3 ML VIA NEBULIZER EVERY 4 HOURS 10/17/2017: LF 08/28/17  . metoprolol succinate (TOPROL-XL) 25 MG 24 hr tablet Take 25 mg by mouth daily. 01/10/2018: LF 8.24.19 DS 90 #180  . nitroGLYCERIN (NITROSTAT) 0.4 MG SL tablet Place 1 tablet (0.4 mg total) under the tongue every 5 (five) minutes as needed for chest pain (x 3 tabs).   Marland Kitchen omeprazole (PRILOSEC) 20 MG capsule Take 1 capsule (20 mg total) by mouth daily. 01/10/2018: LF 10.23.19 DS 90  . traMADol (ULTRAM) 50 MG tablet Take 1 tablet (50 mg total) by mouth every 12 (twelve) hours as needed. for pain   . vitamin B-12 (CYANOCOBALAMIN) 1000 MCG tablet Take 1 tablet (1,000 mcg total) by mouth daily. (Patient not taking: Reported on 01/10/2018)    No facility-administered encounter  medications on file as of 01/23/2018.     Functional Status:  In your present state of health, do you have any difficulty performing the following activities: 01/10/2018 10/16/2017  Hearing? Y -  Comment a lttle bit -  Vision? N -  Difficulty concentrating or making decisions? N -  Walking or climbing stairs? N -  Dressing or bathing? N -  Doing errands, shopping? N N  Some recent data might be hidden    Fall/Depression Screening:  PHQ 2/9 Scores 12/28/2017 12/06/2017 11/02/2017 09/21/2017 09/15/2017 06/29/2017 06/08/2017  PHQ - 2 Score 0 0 0 0 0 0 3  PHQ- 9 Score - - - - - - 5    Assessment: [facility] CSW arrived at [facility] SNF to complete [facility] Consult. Patient and daughter [name] were there and informed [facility] CSW that patient's insurance cut services off and that the family filed an appeal but that appeal was denied. Patient will be discharging back to Lisa's home and staying with her during the week and with another daughter during the weekends until family feel comfortable with patient being at her house alone as the daughter that lives there is a flight attendant and not there. Patient reports that she is disappointed that she is leaving [employer] because she somehow injured her foot in therapy yesterday and doesn't feel ready to return home. LPN dropped off medications with family. Family would benefit from [place] involvement to assist with medication education/assistance. HH has not been set  up yet. [facility] CSW walked with daughter [name] to social work office to discuss discharge plans and was informed that Education officer, museum is out of the office on lunch and would be back later. Family is wanting [facility] services and [name] will meet with social worker at SNF upon her return back from lunch. Patient is agreeable to [place] and Pharmacist follow up post SNF discharge. [facility] CSW will sign off at this time.  Plan: [facility] CSW will sign off at this time and complete requested referrals.   Eula Fried, BSW,  MSW, [email or web address] Phone: [phone] Fax: [phone]

## 2018-01-23 NOTE — Telephone Encounter (Signed)
LM for daughter [name] to call back.  Patient has a designated party release form on file that allows [place] to speak with [name].  I will help her answer questions when she calls back.  Jose Alleyne,CMA

## 2018-01-24 ENCOUNTER — Other Ambulatory Visit: Payer: Self-pay | Admitting: *Deleted

## 2018-01-24 ENCOUNTER — Telehealth: Payer: Self-pay

## 2018-01-24 DIAGNOSIS — N184 Chronic kidney disease, stage 4 (severe): Secondary | ICD-10-CM | POA: Diagnosis not present

## 2018-01-24 DIAGNOSIS — L03116 Cellulitis of left lower limb: Secondary | ICD-10-CM | POA: Diagnosis not present

## 2018-01-24 DIAGNOSIS — S92355D Nondisplaced fracture of fifth metatarsal bone, left foot, subsequent encounter for fracture with routine healing: Secondary | ICD-10-CM | POA: Diagnosis not present

## 2018-01-24 DIAGNOSIS — I5032 Chronic diastolic (congestive) heart failure: Secondary | ICD-10-CM | POA: Diagnosis not present

## 2018-01-24 DIAGNOSIS — I130 Hypertensive heart and chronic kidney disease with heart failure and stage 1 through stage 4 chronic kidney disease, or unspecified chronic kidney disease: Secondary | ICD-10-CM | POA: Diagnosis not present

## 2018-01-24 DIAGNOSIS — J449 Chronic obstructive pulmonary disease, unspecified: Secondary | ICD-10-CM | POA: Diagnosis not present

## 2018-01-24 NOTE — Patient Outreach (Signed)
[name]) Care Management  01/24/2018  [name] September 04, 1923 [id number]    Initial spoke with caregiver daughter [name] who indicated pt was currently recovering with another daughter [name]. RN obtained the contact number and spoke with pt's daughter [name] who indicated [facility] is involved and pt will be receiving PT/RN for ongoing wound care post hospital surgery. RN attempted to complete the transition of care template however daugther [name]) does not have the pt's medication list but will confirm with PCP office visit on Friday and present this list to this RN case manager next week follow up call.  RN discussed any other needs and offered to assist further however no other needs at this time. RN offered to follow up with ongoing transition of care calls over the next few weeks and discussed possible plan of care along with goals and interventions that daughter was receptive to and will relay this information to the pt for participation. RN inquired on pt's COPD which is controlled at this time with ongoing medication. Also verified source of transportation for all pending medical appointments and this was confirmed.  Based upon the information provided RN discussed safety and prevention measures to lower the risk of readmission and adherence to all medical appointments and prescribed medications. No other inquires or request at this time as plan of care is in place and provider will be notified of pt's disposition with [facility] services.   [facility] CM Care Plan Problem One     Most Recent Value  Care Plan Problem One  Recent hospitalization related to left leg  Role Documenting the Problem One  Care Management [employer] for Problem One  Active  [facility]N Long Term Goal   Pt will be more knowledgable on prevention measures to avoid readmission over the next 60 days  [facility] Long Term Goal Start Date  01/24/18  Interventions for Problem One Long Term Goal  Will discusse  dprevention measures and use of DME to avoid additional injuries related to pt's lack of strength to her left leg. Will encouraged ordered therapies with [facility] PT services.  [facility] CM Short Term Goal #1   Adherence with all scheduled medical appointments post discharge from SNF over the next 30 days  [facility] CM Short Term Goal #1 Start Date  01/24/18  Interventions for Short Term Goal #1  Will verify all pending appointments and requested caregiver to follow up with the speicalist on possible appointment. Will verify sufficient transportation to all pending appointments.   [facility] CM Short Term Goal #2   Adherence with all discharge medications over the next 30 days  [facility] CM Short Term Goal #2 Start Date  01/24/18  Interventions for Short Term Goal #2  Will offer to review all medications however  caregiver reports pt's medications were review with the [facility] agency and will be reviewed again pending PCP appointment on Friday 11/29. Currently this caregiver does not have the list of medication s but to obtain for review on the next follow up call next week.      Tammy Mina, RN Care Management Coordinator [place] Office [phone]

## 2018-01-24 NOTE — Telephone Encounter (Signed)
Betsy, PT with [facility], called needing verbal orders:  1. Continue HH PT 2x/week for 3 weeks, 1x/week for 1 week, 0x/week x 1 week, then 1x/week for 3 weeks  2. Nsg eval for wound care  3. [place] aide  Call back is [phone]  Danley Danker, RN [facility] RN)

## 2018-01-26 ENCOUNTER — Ambulatory Visit: Payer: Self-pay | Admitting: Pharmacist

## 2018-01-26 DIAGNOSIS — L03116 Cellulitis of left lower limb: Secondary | ICD-10-CM | POA: Diagnosis not present

## 2018-01-26 DIAGNOSIS — N184 Chronic kidney disease, stage 4 (severe): Secondary | ICD-10-CM | POA: Diagnosis not present

## 2018-01-26 DIAGNOSIS — I130 Hypertensive heart and chronic kidney disease with heart failure and stage 1 through stage 4 chronic kidney disease, or unspecified chronic kidney disease: Secondary | ICD-10-CM | POA: Diagnosis not present

## 2018-01-26 DIAGNOSIS — S92355D Nondisplaced fracture of fifth metatarsal bone, left foot, subsequent encounter for fracture with routine healing: Secondary | ICD-10-CM | POA: Diagnosis not present

## 2018-01-26 DIAGNOSIS — J449 Chronic obstructive pulmonary disease, unspecified: Secondary | ICD-10-CM | POA: Diagnosis not present

## 2018-01-26 DIAGNOSIS — I5032 Chronic diastolic (congestive) heart failure: Secondary | ICD-10-CM | POA: Diagnosis not present

## 2018-01-27 NOTE — Telephone Encounter (Signed)
Please call verbal orders for 1. HH PT 2x/wk x 3 wk, 1x/wk x 1 wk, 0x/wk x 1 wk, 1 x/wk x 3 wk 2. HH RN for wound care 3. [place] Aide

## 2018-01-28 ENCOUNTER — Other Ambulatory Visit: Payer: Self-pay | Admitting: Family Medicine

## 2018-01-28 DIAGNOSIS — I2510 Atherosclerotic heart disease of native coronary artery without angina pectoris: Secondary | ICD-10-CM

## 2018-01-28 DIAGNOSIS — E7800 Pure hypercholesterolemia, unspecified: Secondary | ICD-10-CM

## 2018-01-28 DIAGNOSIS — Z9189 Other specified personal risk factors, not elsewhere classified: Secondary | ICD-10-CM

## 2018-01-28 DIAGNOSIS — I252 Old myocardial infarction: Secondary | ICD-10-CM

## 2018-01-29 ENCOUNTER — Ambulatory Visit: Payer: Self-pay | Admitting: Pharmacist

## 2018-01-29 DIAGNOSIS — I130 Hypertensive heart and chronic kidney disease with heart failure and stage 1 through stage 4 chronic kidney disease, or unspecified chronic kidney disease: Secondary | ICD-10-CM | POA: Diagnosis not present

## 2018-01-29 DIAGNOSIS — I5032 Chronic diastolic (congestive) heart failure: Secondary | ICD-10-CM | POA: Diagnosis not present

## 2018-01-29 DIAGNOSIS — S92352A Displaced fracture of fifth metatarsal bone, left foot, initial encounter for closed fracture: Secondary | ICD-10-CM | POA: Diagnosis not present

## 2018-01-29 DIAGNOSIS — J449 Chronic obstructive pulmonary disease, unspecified: Secondary | ICD-10-CM | POA: Diagnosis not present

## 2018-01-29 DIAGNOSIS — M79672 Pain in left foot: Secondary | ICD-10-CM | POA: Diagnosis not present

## 2018-01-29 DIAGNOSIS — S92355D Nondisplaced fracture of fifth metatarsal bone, left foot, subsequent encounter for fracture with routine healing: Secondary | ICD-10-CM | POA: Diagnosis not present

## 2018-01-29 DIAGNOSIS — L03116 Cellulitis of left lower limb: Secondary | ICD-10-CM | POA: Diagnosis not present

## 2018-01-29 DIAGNOSIS — N184 Chronic kidney disease, stage 4 (severe): Secondary | ICD-10-CM | POA: Diagnosis not present

## 2018-01-29 NOTE — Telephone Encounter (Signed)
Verbal orders given to [facility].  Jhoanna Heyde,CMA

## 2018-01-30 ENCOUNTER — Other Ambulatory Visit: Payer: Self-pay | Admitting: Pharmacist

## 2018-01-30 DIAGNOSIS — H353221 Exudative age-related macular degeneration, left eye, with active choroidal neovascularization: Secondary | ICD-10-CM | POA: Diagnosis not present

## 2018-01-30 DIAGNOSIS — H353211 Exudative age-related macular degeneration, right eye, with active choroidal neovascularization: Secondary | ICD-10-CM | POA: Diagnosis not present

## 2018-01-30 DIAGNOSIS — I5032 Chronic diastolic (congestive) heart failure: Secondary | ICD-10-CM | POA: Diagnosis not present

## 2018-01-30 DIAGNOSIS — N184 Chronic kidney disease, stage 4 (severe): Secondary | ICD-10-CM | POA: Diagnosis not present

## 2018-01-30 DIAGNOSIS — S92355D Nondisplaced fracture of fifth metatarsal bone, left foot, subsequent encounter for fracture with routine healing: Secondary | ICD-10-CM | POA: Diagnosis not present

## 2018-01-30 DIAGNOSIS — I130 Hypertensive heart and chronic kidney disease with heart failure and stage 1 through stage 4 chronic kidney disease, or unspecified chronic kidney disease: Secondary | ICD-10-CM | POA: Diagnosis not present

## 2018-01-30 DIAGNOSIS — J449 Chronic obstructive pulmonary disease, unspecified: Secondary | ICD-10-CM | POA: Diagnosis not present

## 2018-01-30 DIAGNOSIS — L03116 Cellulitis of left lower limb: Secondary | ICD-10-CM | POA: Diagnosis not present

## 2018-01-30 NOTE — Patient Outreach (Signed)
[place] [facility]) Care Management  [place]   01/30/2018  [name] 1923/03/12 [id number]   Reason for referral: medication management  Referral source: [facility] RN CM Current insurance: [employer]   PMHx: CHF, GERD, HTN, CAD, Afib, COPD, osteoarthritis, vit B/D deficiencies, anemia   HPI:  Successful outreach call to [name] with HIPAA identifiers verified x3.  Patient remained engaged and alert & oriented x3 during telephonic appointment with [place] Pharmacist.  She was agreeable to review medications telephonically.  She is a very composed 84 year old that is aware of all medications, purpose and side effects.  She states she works with Dr. McDiarmid (PCP) and follows his instructions.  Patient states she has no issues affording medications and would like to continue using [facility] as her Pharmacy.  She stated she enjoyed her stay at her SNF [facility]) and wishes that she were back there.  She states the staff was very welcoming and accommodating.  Patient is very appreciative of [facility] CM Pharmacy's visit.    Objective: Lab Results  Component Value Date   CREATININE 1.69 (H) 01/15/2018   CREATININE 2.25 (H) 01/14/2018   CREATININE 2.06 (H) 01/13/2018    Lab Results  Component Value Date   HGBA1C 5.5 12/28/2017    Lipid Panel     Component Value Date/Time   CHOL 130 08/11/2016 1101   TRIG 106 08/11/2016 1101   HDL 37 (L) 08/11/2016 1101   CHOLHDL 3.5 08/11/2016 1101   CHOLHDL 3.4 07/30/2015 1231   VLDL 29 07/30/2015 1231   LDLCALC 72 08/11/2016 1101   LDLDIRECT 74 05/08/2014 1110    BP Readings from Last 3 Encounters:  01/15/18 117/71  12/28/17 134/82  12/13/17 136/80    Allergies  Allergen Reactions  . Baclofen Other (See Comments)    Confusion occurred with taking 3 at the same time  . Codeine Phosphate Nausea Only  . Hydrochlorothiazide Other (See Comments)    Possible acute gout or arthritis of wrist  . Montelukast Sodium Other  (See Comments)    Reaction not recalled ??  Lebron Quam [Hydrocodone-Acetaminophen] Nausea And Vomiting    Medications Reviewed Today    Reviewed by Lavera Guise, [facility] (Pharmacist) on 01/30/18 at 37  Med List Status: <None>  Medication Order Taking? Sig Documenting Provider Last Dose Status Informant  albuterol (PROVENTIL) (2.5 MG/3ML) 0.083% nebulizer solution [id number] Yes Take 3 mLs (2.5 mg total) by nebulization every 6 (six) hours as needed for wheezing or shortness of breath. McDiarmid, Blane Ohara, MD Taking Active Multiple Informants           Med Note Arna Snipe Jan 10, 2018  6:43 PM) LF 10.29.19  amiodarone (PACERONE) 200 MG tablet [id number] Yes Take one tablet by mouth daily Monday through Friday.  Do not take on Saturday or Sunday. Evans Lance, MD Taking Active Multiple Informants           Med Note Corbin Ade   Wed Jan 10, 2018  6:44 PM) LF 10.23.19 DS 45 #90  apixaban (ELIQUIS) 2.5 MG TABS tablet [id number] Yes Take 1 tablet (2.5 mg total) by mouth 2 (two) times daily. Consuelo Pandy, PA-C Taking Active Multiple Informants           Med Note Corbin Ade   Wed Jan 10, 2018  6:44 PM) LF 10.23.19 DS 30 #60  atorvastatin (LIPITOR) 20 MG tablet [id number] Yes TAKE 1 TABLET BY MOUTH DAILY McDiarmid,  Blane Ohara, MD Taking Active   budesonide (PULMICORT) 0.5 MG/2ML nebulizer solution [id number] Yes Take 2 mLs (0.5 mg total) by nebulization 2 (two) times daily. McDiarmid, Blane Ohara, MD Taking Active Multiple Informants  fluticasone (FLONASE) 50 MCG/ACT nasal spray [id number] Yes SHAKE LIQUID AND USE 2 SPRAYS IN EACH NOSTRIL DAILY Byrum, Rose Fillers, MD Taking Active Multiple Informants           Med Note Luana Shu, [place] R   Tue Oct 17, 2017 10:19 AM) LF 04/29/17  furosemide (LASIX) 20 MG tablet [id number] Yes Take 1 tablet (20 mg total) by mouth daily.  Patient taking differently:  Take 20 mg by mouth daily. Takes as needed takes 1-2tablet   Consuelo Pandy, PA-C Taking  Active Multiple Informants           Med Note Corbin Ade   Wed Jan 10, 2018  6:46 PM) LF 8.19.19 DS 90  ipratropium-albuterol (DUONEB) 0.5-2.5 (3) MG/3ML SOLN [id number] Yes USE 3 ML VIA NEBULIZER EVERY 4 HOURS Byrum, Rose Fillers, MD Taking Active Multiple Informants           Med Note Luana Shu, [place] R   Tue Oct 17, 2017 10:21 AM) LF 08/28/17  metoprolol succinate (TOPROL-XL) 25 MG 24 hr tablet [id number] Yes Take 25 mg by mouth daily. [provider] Taking Active Multiple Informants           Med Note Corbin Ade   Wed Jan 10, 2018  6:48 PM) LF 8.24.19 DS 90 #180  nitroGLYCERIN (NITROSTAT) 0.4 MG SL tablet [id number] Yes Place 1 tablet (0.4 mg total) under the tongue every 5 (five) minutes as needed for chest pain (x 3 tabs). McDiarmid, Blane Ohara, MD Taking Active Multiple Informants           Med Note ([facility], [address]ta [address] Dec 19, 2016  2:41 PM)    omeprazole (PRILOSEC) 20 MG capsule [id number] Yes Take 1 capsule (20 mg total) by mouth daily. Zenia Resides, MD Taking Active Multiple Informants           Med Note Corbin Ade   Wed Jan 10, 2018  6:49 PM) LF 10.23.19 DS 90  traMADol (ULTRAM) 50 MG tablet [id number] Yes TAKE 1 TABLET BY MOUTH EVERY 6 HOURS AS NEEDED FOR PAIN McDiarmid, Blane Ohara, MD Taking Active   vitamin B-12 (CYANOCOBALAMIN) 1000 MCG tablet [id number] Yes Take 1 tablet (1,000 mcg total) by mouth daily. Zenia Resides, MD Taking Active Multiple Informants         Assessment:  Drugs sorted by system:  Cardiovascular: apixaban, amiodarone, atorvastatin, furosemide, nitrates PRN, metoprolol  Pulmonary/Allergy: albuterol, Flonase, budesonide nebs, DuoNebs,  Gastrointestinal: omeprazole  Pain: tramadol  Vitamins/Minerals/Supplements: vitB12  Medication Review Findings:  . Patient continues to take medications as prescribed.  She has no issues or needs at this time. . Patient only taking furosemide 1-2 times per week.  She states her doctor is aware  and is working with her to control her CHF symptoms while maintaining patient's baseline renal function.  Medication updated in [facility] . Patient denies falls and signs/symptoms of bleeding while on apixaban.  She states she has no issues with side effects of any medication.  Counseled patient to call MD or seek emergency care if she falls of has uncontrolled bleeding.        Plan: -[place] case is being closed due to the following reasons:  Goals have been met.  Patient has been provided [facility]  CM contact information if assistance needed in the future.    Thank you for allowing [facility] pharmacy to be involved in this patient's care.        [place] , PharmD, BCPS Clinical Pharmacist [facility]  [phone]        

## 2018-01-31 DIAGNOSIS — N184 Chronic kidney disease, stage 4 (severe): Secondary | ICD-10-CM | POA: Diagnosis not present

## 2018-01-31 DIAGNOSIS — S92355D Nondisplaced fracture of fifth metatarsal bone, left foot, subsequent encounter for fracture with routine healing: Secondary | ICD-10-CM | POA: Diagnosis not present

## 2018-01-31 DIAGNOSIS — I5032 Chronic diastolic (congestive) heart failure: Secondary | ICD-10-CM | POA: Diagnosis not present

## 2018-01-31 DIAGNOSIS — I130 Hypertensive heart and chronic kidney disease with heart failure and stage 1 through stage 4 chronic kidney disease, or unspecified chronic kidney disease: Secondary | ICD-10-CM | POA: Diagnosis not present

## 2018-01-31 DIAGNOSIS — L03116 Cellulitis of left lower limb: Secondary | ICD-10-CM | POA: Diagnosis not present

## 2018-01-31 DIAGNOSIS — J449 Chronic obstructive pulmonary disease, unspecified: Secondary | ICD-10-CM | POA: Diagnosis not present

## 2018-02-01 ENCOUNTER — Other Ambulatory Visit: Payer: Self-pay

## 2018-02-01 ENCOUNTER — Other Ambulatory Visit: Payer: Self-pay | Admitting: *Deleted

## 2018-02-01 ENCOUNTER — Ambulatory Visit (INDEPENDENT_AMBULATORY_CARE_PROVIDER_SITE_OTHER): Payer: Medicare Other | Admitting: Family Medicine

## 2018-02-01 ENCOUNTER — Encounter: Payer: Self-pay | Admitting: Family Medicine

## 2018-02-01 VITALS — BP 124/78 | HR 90 | Temp 97.7°F | Ht 65.0 in | Wt 174.0 lb

## 2018-02-01 DIAGNOSIS — I2510 Atherosclerotic heart disease of native coronary artery without angina pectoris: Secondary | ICD-10-CM

## 2018-02-01 DIAGNOSIS — S91302A Unspecified open wound, left foot, initial encounter: Secondary | ICD-10-CM | POA: Diagnosis not present

## 2018-02-01 DIAGNOSIS — S92355D Nondisplaced fracture of fifth metatarsal bone, left foot, subsequent encounter for fracture with routine healing: Secondary | ICD-10-CM

## 2018-02-01 DIAGNOSIS — G309 Alzheimer's disease, unspecified: Secondary | ICD-10-CM | POA: Diagnosis not present

## 2018-02-01 DIAGNOSIS — F0280 Dementia in other diseases classified elsewhere without behavioral disturbance: Secondary | ICD-10-CM

## 2018-02-01 DIAGNOSIS — R0609 Other forms of dyspnea: Secondary | ICD-10-CM | POA: Diagnosis not present

## 2018-02-01 DIAGNOSIS — N184 Chronic kidney disease, stage 4 (severe): Secondary | ICD-10-CM | POA: Diagnosis not present

## 2018-02-01 DIAGNOSIS — Z79899 Other long term (current) drug therapy: Secondary | ICD-10-CM

## 2018-02-01 DIAGNOSIS — L03116 Cellulitis of left lower limb: Secondary | ICD-10-CM | POA: Diagnosis not present

## 2018-02-01 NOTE — Patient Instructions (Signed)
Dr McDiarmid is concerned that you could have some fluid built up in your lungs.  Please get a Chest Xray tomorrow to see if there is fluid in the lungs.   We are checking a blood test today, BNP, that helps with seeing if your heart is under increased pressure and needs some fluid taken off using your Lasix (furosemide).    We will work on arranging [facility] to work with Mrs Thaker on her cognitive skills.     We will arrange a nurses visit at the [facility] to test ;your oxygen levels breathing regular air and breathing added oxygen.  This will help see if you would benefit from home oxygen and Medicare will cover the cost.   Dr McDiarmid will try to find a pulmonologist at [facility] that you could get a good second opinion from.

## 2018-02-01 NOTE — Patient Outreach (Addendum)
[place] [facility]) Care Management  02/01/2018  [name] 28-Aug-1923 [id number]  RN attempted to reach pt however not available and unable to leave a message. WiIll rescheduled another follow up call onver thenext week concerning ongoing [facility] services.   PCP to complete transition of care call  [name], RN Care Management Coordinator [place] Office [phone]

## 2018-02-02 ENCOUNTER — Ambulatory Visit
Admission: RE | Admit: 2018-02-02 | Discharge: 2018-02-02 | Disposition: A | Discharge status: Home / Self Care | Payer: Medicare Other | Source: Ambulatory Visit | Attending: Family Medicine | Admitting: Family Medicine

## 2018-02-02 ENCOUNTER — Encounter: Payer: Self-pay | Admitting: Family Medicine

## 2018-02-02 ENCOUNTER — Telehealth: Payer: Self-pay

## 2018-02-02 DIAGNOSIS — L03116 Cellulitis of left lower limb: Secondary | ICD-10-CM | POA: Diagnosis not present

## 2018-02-02 DIAGNOSIS — I5032 Chronic diastolic (congestive) heart failure: Secondary | ICD-10-CM | POA: Diagnosis not present

## 2018-02-02 DIAGNOSIS — S92355D Nondisplaced fracture of fifth metatarsal bone, left foot, subsequent encounter for fracture with routine healing: Secondary | ICD-10-CM | POA: Diagnosis not present

## 2018-02-02 DIAGNOSIS — I130 Hypertensive heart and chronic kidney disease with heart failure and stage 1 through stage 4 chronic kidney disease, or unspecified chronic kidney disease: Secondary | ICD-10-CM | POA: Diagnosis not present

## 2018-02-02 DIAGNOSIS — R0609 Other forms of dyspnea: Principal | ICD-10-CM

## 2018-02-02 DIAGNOSIS — J9811 Atelectasis: Secondary | ICD-10-CM | POA: Diagnosis not present

## 2018-02-02 DIAGNOSIS — J449 Chronic obstructive pulmonary disease, unspecified: Secondary | ICD-10-CM | POA: Diagnosis not present

## 2018-02-02 DIAGNOSIS — N184 Chronic kidney disease, stage 4 (severe): Secondary | ICD-10-CM | POA: Diagnosis not present

## 2018-02-02 LAB — BASIC METABOLIC PANEL WITH GFR
BUN/Creatinine Ratio: 17 (ref 12–28)
BUN: 30 mg/dL (ref 10–36)
CO2: 25 mmol/L (ref 20–29)
Calcium: 9.1 mg/dL (ref 8.7–10.3)
Chloride: 103 mmol/L (ref 96–106)
Creatinine, Ser: 1.81 mg/dL — ABNORMAL HIGH (ref 0.57–1.00)
GFR calc Af Amer: 28 mL/min/{1.73_m2} — ABNORMAL LOW
GFR calc non Af Amer: 24 mL/min/{1.73_m2} — ABNORMAL LOW
Glucose: 108 mg/dL — ABNORMAL HIGH (ref 65–99)
Potassium: 4.9 mmol/L (ref 3.5–5.2)
Sodium: 144 mmol/L (ref 134–144)

## 2018-02-02 LAB — BRAIN NATRIURETIC PEPTIDE: BNP: 624.2 pg/mL — ABNORMAL HIGH (ref 0.0–100.0)

## 2018-02-02 NOTE — Assessment & Plan Note (Signed)
resolved 

## 2018-02-02 NOTE — Progress Notes (Signed)
   Subjective:    Patient ID: [name], female    DOB: [birth year], 84 y.o.   MRN: [id number]  HPI    Review of Systems     Objective:   Physical Exam        Assessment & Plan:

## 2018-02-02 NOTE — Telephone Encounter (Signed)
Patient calling for xray results.  Morgan, RN [facility] RN)

## 2018-02-02 NOTE — Assessment & Plan Note (Signed)
New problem Cardiovascular system   Heart failure (Cough, edema, DOE, Hx HFpEF, Hx CAD)   Respiratory system  COPD exacerbation  Plan: CXR, BNP r/o HF  Take Lasix 20 mg today

## 2018-02-02 NOTE — Assessment & Plan Note (Signed)
MMSE report from [facility] in Nov. 2019 12/30.  (+) Dependence in iADLs C/W dementia

## 2018-02-02 NOTE — Progress Notes (Signed)
Subjective:    Patient ID: [name], female    DOB: May 05, 1923, 84 y.o.   MRN: [id number] [name] is accompanied by daughter, [name] Sources of clinical information for visit is/are patient, relative(s), past medical records.  Records from [facility] not available.  These records were requested.  Nursing assessment for this office visit was reviewed with the patient for accuracy and revision.  Previous Report(s) Reviewed: historical medical records, DC summary  and read left foot Xray 01/10/18  Depression screen PHQ 2/9 02/01/2018  Decreased Interest 0  Down, Depressed, Hopeless 0  PHQ - 2 Score 0  Altered sleeping -  Tired, decreased energy -  Change in appetite -  Feeling bad or failure about yourself  -  Trouble concentrating -  Moving slowly or fidgety/restless -  Suicidal thoughts -  PHQ-9 Score -  Difficult doing work/chores -  Some recent data might be hidden   Fall Risk  02/01/2018 12/25/2017 12/06/2017 11/02/2017 10/06/2017  Falls in the past year? 1 No Yes Yes No  Comment - - - - -  Number falls in past yr: 1 - 2 or more 2 or more -  Injury with Fall? 1 - Yes Yes -  Comment - - - - -  Risk Factor Category  - - High Fall Risk High Fall Risk -  Risk for fall due to : - - History of fall(s) History of fall(s) -  Follow up - - - - -     There are no preventive care reminders to display for this patient.  Diabetes Health Maintenance Due  Topic Date Due  . LIPID PANEL  08/11/2017    Health Maintenance Due  Topic Date Due  . LIPID PANEL  08/11/2017     HPI Follow up outpatient visit after Hospitalization Dates 01/10/18 - 01/13/18 HPI Principle Diagnosis requiring hospitalization: Left foot skin wound with cellulitis  Discharge Diagnoses/Problem List:  L 5th metatarsal fx Afib COPD HFpEF Sick sinus syndrome with pacemaker CKD 4 w solitary kidney  Brief Hospital Course:  [name] is a 84 year old female admitted on 01/10/2018 for left foot and  calf pain, found to be secondary to a non-displaced transverse left 5th metatarsal fracture, as well as cellulitis of the left lower extremity stemming from a dorsal left foot injury.  started on IV Cefazolin, which was later transitioned to PO Keflex through 11/17. The patient's white blood cell count stayed within normal range and the patient was afebrile throughout the admission. Blood cultures eventually returned without growth. Doppler ultrasound of the left lower extremity ruled out a DVT. Pain was managed with oxycodone and tramadol and she was also given a Tdap booster considering the open dorsal left foot lesion with surrounding cellulitis. The patient was given a hard-sole shoe to assist with ambulation. PT/OT were consulted and recommended SNF placement, especially considering the patient's help at home from her daughters is dwindling due to busy work schedules.   The patient's creatinine level increased to as high as 2.25 on GFR decreased to the low 20s during the admission most likely due to lasix and poor PO intake. Nephrology was consulted and recommended a temporary hold on the IV Lasix, and her creatinine appropriately decreased to 1.69 (base line is ~1.6). Lasix 20mg  can be continued once she is outpatient with good PO intake.  The patient's chronic conditions, such as COPD, A. Fib, and HFpEF were appropriately managed with her home medications and she remained  stable throughout her admission. She continued to improve and was medically cleared for discharge to SNF [facility] on 01/15/2018. Issues for Follow Up:  1. Continued PT for future injury prevention. 2. Encourage adequate PO intake while on Lasix to prevent AKI. 3. Continue all home medications as prescribed. ---------------------------------------------------------------------------------------------------------------------- Problems since hospital discharge:  Shortness of Breath Onset: Since DC from [facility] Duration: ~  1 week Alleviating factors: Albuterol neb using three times a day Aggravating Factors: walking more than 50 ft level Severity or functional limitations: limiting ambulation Associated symptoms: cough-productive, white spt, chesty, No orthopnea, (+) ankle edema incr, no chest pain Course: stable Context: COPD stage IV, EF 55% 09/2017 TTE  ---------------------------------------------------------------------------------------------------------------------- Follow up appointments with specialists:  Pending: Dr Lamonte Sakai [facility]),  [facility] cardiology, Dr Donnetta Hutching (VVS) all in Dec 2019. Completed: DR Doran Durand (Ortho) - left 5th metatarsal sytyloid avulsion fracture healing ---------------------------------------------------------------------------------------------------------------------- New medications started during hospitalization: none Chronic medications stopped during hospitalization: None.  Patient has taken Lasix only once since DC from [facility] Patient's Medication List was updated in the EMR: yes --------------------------------------------------------------------------------------------------------------------- Home Health Services: [facility] PT, RN, HHA Durable Medical Equipment: Kasandra Knudsen --------------------------------------------------------------------------------------------------------------------- ADLs Independent Needs Assistance Dependent  Bathing  x   Dressing x    Ambulation x    Toileting x    Eating x     IADL Independent Needs Assistance Dependent  Cooking  x   Housework   x  Manage Medications   x  Manage the telephone x    Shopping for food, clothes, Meds, etc  x   Use transportation   x  Manage Finances   x   SH: No smoking, 1-2 glasses wine at night Review of Systems See hpi    Objective:   Physical Exam Today's Vitals   02/01/18 1607  BP: 124/78  Pulse: 90  Temp: 97.7 F (36.5 C)  TempSrc: Oral  SpO2: 97%  Weight: 174 lb (78.9 kg)  Height: 5\' 5"  (1.651  m)  PainSc: 0-No pain   Body mass index is 28.96 kg/m.  VS reviewed GEN: Alert, Cooperative, Groomed, NAD, (+) chesty cough COR: irr irr, No M/G LUNGS: bilateral crackles lower lung fields, UAW rhonci, no wheezing, talking full sentences, Incr WOB with walking   EXT: Bilateral ankle edema; no tenderness over left 5th MT styloid SKIN: Healed wound dorsum left foot Gait: slower speed, No significant path deviation, Step through +, using cane correct Psych: Normal affect/thought/speech/language    Assessment & Plan:  Visit Problem List with A/P  Wound of left foot Resolved left foot dorsum wound  Chronic kidney disease (CKD), stage IV (severe) ([place]) Established problem. Stable. Lab Results  Component Value Date   CREATININE 1.81 (H) 02/01/2018      Dyspnea New problem Cardiovascular system   Heart failure (Cough, edema, DOE, Hx HFpEF, Hx CAD)   Respiratory system  COPD exacerbation  Plan: CXR, BNP r/o HF  Take Lasix 20 mg today   Cellulitis resolved  Left leg cellulitis resolved  Left Metatarsal fracture, 5th, avulsion styloid Healing  Dementia ([place]), possible Alzhemier's type MMSE report from [facility] in Nov. 2019 12/30.  (+) Dependence in iADLs C/W dementia

## 2018-02-02 NOTE — Assessment & Plan Note (Signed)
Resolved left foot dorsum wound

## 2018-02-02 NOTE — Assessment & Plan Note (Signed)
Established problem. Stable. Lab Results  Component Value Date   CREATININE 1.81 (H) 02/01/2018

## 2018-02-02 NOTE — Assessment & Plan Note (Signed)
Healing

## 2018-02-05 ENCOUNTER — Telehealth: Payer: Self-pay | Admitting: Emergency Medicine

## 2018-02-05 DIAGNOSIS — L03116 Cellulitis of left lower limb: Secondary | ICD-10-CM | POA: Diagnosis not present

## 2018-02-05 DIAGNOSIS — N184 Chronic kidney disease, stage 4 (severe): Secondary | ICD-10-CM | POA: Diagnosis not present

## 2018-02-05 DIAGNOSIS — S92355D Nondisplaced fracture of fifth metatarsal bone, left foot, subsequent encounter for fracture with routine healing: Secondary | ICD-10-CM | POA: Diagnosis not present

## 2018-02-05 DIAGNOSIS — I5032 Chronic diastolic (congestive) heart failure: Secondary | ICD-10-CM | POA: Diagnosis not present

## 2018-02-05 DIAGNOSIS — I130 Hypertensive heart and chronic kidney disease with heart failure and stage 1 through stage 4 chronic kidney disease, or unspecified chronic kidney disease: Secondary | ICD-10-CM | POA: Diagnosis not present

## 2018-02-05 DIAGNOSIS — J449 Chronic obstructive pulmonary disease, unspecified: Secondary | ICD-10-CM | POA: Diagnosis not present

## 2018-02-05 NOTE — Telephone Encounter (Signed)
I spoke with [name] about her CXR without acute findings and her usual mildly elevated BNP.  She reports having a good weekend.   No changes at this time.

## 2018-02-05 NOTE — Telephone Encounter (Signed)
Call made to patient daughter, requesting a med list be faxed to her. Fax # [phone]. Report faxed. Confirmation received. Nothing further is needed at this time.

## 2018-02-06 ENCOUNTER — Telehealth: Payer: Self-pay | Admitting: Emergency Medicine

## 2018-02-06 DIAGNOSIS — I5032 Chronic diastolic (congestive) heart failure: Secondary | ICD-10-CM | POA: Diagnosis not present

## 2018-02-06 DIAGNOSIS — N184 Chronic kidney disease, stage 4 (severe): Secondary | ICD-10-CM | POA: Diagnosis not present

## 2018-02-06 DIAGNOSIS — S92355D Nondisplaced fracture of fifth metatarsal bone, left foot, subsequent encounter for fracture with routine healing: Secondary | ICD-10-CM | POA: Diagnosis not present

## 2018-02-06 DIAGNOSIS — I130 Hypertensive heart and chronic kidney disease with heart failure and stage 1 through stage 4 chronic kidney disease, or unspecified chronic kidney disease: Secondary | ICD-10-CM | POA: Diagnosis not present

## 2018-02-06 DIAGNOSIS — L03116 Cellulitis of left lower limb: Secondary | ICD-10-CM | POA: Diagnosis not present

## 2018-02-06 DIAGNOSIS — J449 Chronic obstructive pulmonary disease, unspecified: Secondary | ICD-10-CM | POA: Diagnosis not present

## 2018-02-06 NOTE — Telephone Encounter (Signed)
Called and spoke with patients daughter, advised her that I have printed a new list and faxed over to requested number. Nothing further needed.

## 2018-02-07 ENCOUNTER — Other Ambulatory Visit: Payer: Self-pay | Admitting: *Deleted

## 2018-02-07 NOTE — Patient Outreach (Signed)
[facility]) Care Management  02/07/2018  TRINDA HARLACHER 1923/10/02 [id number]    RN spoke with daughter [name] with update on pt's ongoing management of care. Reports pt remains involved with [facility] for PT/short term memory consult and wound care. Several topics discussed today regarding pt's management of care:  Edema- Reports pt's wound has improved but she has been ordered to consult with a vein specialist due to her history of ongoing LE swelling scheduled on 12/31.  Pulmonology- Pt had some crackles and taken to visit her primary who performed a chest x-ray which was "okay" and no interventions mentioned. Due to pt's breathing (quickly to become SOB with short distances). MD has ordered for pt to nurse to consult in his office for a breathing assessment for portable O2. Pt also has a pulmonary appointment pending next week. Medication- Inquires on medications daughter reports this is reviewed weekly as she fills the pt's pill box however will consult with the pt's provider and obtain an updated medication list on Tuesday but verified pt taking all her medications as prescribed. Mobility- Reports pt uses her rollator and can when ambulating to prevent falls and continue to do well with no reported falls or injuries.    Other projects in progress relate to pt's home is being remodeled to ensure her safety when she returns home. Reports her bathroom is being remodeled with a walk-in shower (inquired on a shower chair with community resources provided) and a vanity is being installed downstairs along with an outlet placed in the down-stair bathroom. Purpose for pt to remain on one level to avoid stairs and ensure her safety. Also removed a large sofa and added a daybed.    Based upon the above information plan of care was discussed along with goals and interventions adjusted according to pt's progress. Will continue to offer community resources that may be beneficial to pt  during her ongoing recovery and continue to seek permission to continue weekly transition of care call over the next few weeks for managing her ongoing care (receptive). No additional needs or inquires at this time as RN will strongly encouraged daughter to work with pt and update on today's conversations for ongoing adherence to the discussed care plan. Will follow up next weeks as discussed today.  THN CM Care Plan Problem One     Most Recent Value  Care Plan Problem One  Recent hospitalization related to left leg  Role Documenting the Problem One  Care Management [place] for Problem One  Active  THN Long Term Goal   Pt will be more knowledgable on prevention measures to avoid readmission over the next 60 days  THN Long Term Goal Start Date  01/24/18  Interventions for Problem One Long Term Goal  Will continue to encouraged use of DME and working with [facility] for strenghtening and endurance exercises to importance her overall mobility and wound healing. Will extend to goal to allow ongoing adherence.  THN CM Short Term Goal #1   Adherence with all scheduled medical appointments post discharge from SNF over the next 30 days  THN CM Short Term Goal #1 Start Date  01/24/18  Interventions for Short Term Goal #1  Will verifiy attendance and extend this goal based upon several pending appointments for managing pt's ongoing care (pulmonologist, vein specialist ect).  THN CM Short Term Goal #2   Adherence with all discharge medications over the next 30 days  THN CM Short Term Goal #2  Start Date  01/24/18  THN CM Short Term Goal #2 Met Date  02/07/18  Interventions for Short Term Goal #2  daughter reports completing pill box weekly and all pt's medications are reviewed weekly.      [name], RN Care Management Coordinator [place] Office [phone]

## 2018-02-08 ENCOUNTER — Ambulatory Visit: Payer: Medicare Other

## 2018-02-08 DIAGNOSIS — S92355D Nondisplaced fracture of fifth metatarsal bone, left foot, subsequent encounter for fracture with routine healing: Secondary | ICD-10-CM | POA: Diagnosis not present

## 2018-02-08 DIAGNOSIS — J449 Chronic obstructive pulmonary disease, unspecified: Secondary | ICD-10-CM | POA: Diagnosis not present

## 2018-02-08 DIAGNOSIS — I5032 Chronic diastolic (congestive) heart failure: Secondary | ICD-10-CM | POA: Diagnosis not present

## 2018-02-08 DIAGNOSIS — R0602 Shortness of breath: Secondary | ICD-10-CM

## 2018-02-08 DIAGNOSIS — N184 Chronic kidney disease, stage 4 (severe): Secondary | ICD-10-CM | POA: Diagnosis not present

## 2018-02-08 DIAGNOSIS — I130 Hypertensive heart and chronic kidney disease with heart failure and stage 1 through stage 4 chronic kidney disease, or unspecified chronic kidney disease: Secondary | ICD-10-CM | POA: Diagnosis not present

## 2018-02-08 DIAGNOSIS — L03116 Cellulitis of left lower limb: Secondary | ICD-10-CM | POA: Diagnosis not present

## 2018-02-08 NOTE — Progress Notes (Addendum)
Pt presents in nurse clinic for oxygen qualifications.   Resting pulse ox: 97% Pulse 80 Exertion (walking full circle) pulse ox: 91% Pulse 100 -pt had to make several stops to catch her breath in order to keep going  *2L O2*  Resting pulse ox: 98% Pulse 77 Exertion (walking full circle) pulse ox: 96% Pulse 88 -pt still had difficulty walking the full circle without taking short breaks.  Will route note to PCP ----------------------------------------------------------------------------------------------------------------------------------------------------------------------------------------------------------------------------------------------------------------------  Mrs [name] does not meet criteria for home oxygen at this time.

## 2018-02-09 DIAGNOSIS — S92355D Nondisplaced fracture of fifth metatarsal bone, left foot, subsequent encounter for fracture with routine healing: Secondary | ICD-10-CM | POA: Diagnosis not present

## 2018-02-09 DIAGNOSIS — N184 Chronic kidney disease, stage 4 (severe): Secondary | ICD-10-CM | POA: Diagnosis not present

## 2018-02-09 DIAGNOSIS — L03116 Cellulitis of left lower limb: Secondary | ICD-10-CM | POA: Diagnosis not present

## 2018-02-09 DIAGNOSIS — I130 Hypertensive heart and chronic kidney disease with heart failure and stage 1 through stage 4 chronic kidney disease, or unspecified chronic kidney disease: Secondary | ICD-10-CM | POA: Diagnosis not present

## 2018-02-09 DIAGNOSIS — I5032 Chronic diastolic (congestive) heart failure: Secondary | ICD-10-CM | POA: Diagnosis not present

## 2018-02-09 DIAGNOSIS — J449 Chronic obstructive pulmonary disease, unspecified: Secondary | ICD-10-CM | POA: Diagnosis not present

## 2018-02-12 DIAGNOSIS — N184 Chronic kidney disease, stage 4 (severe): Secondary | ICD-10-CM | POA: Diagnosis not present

## 2018-02-12 DIAGNOSIS — I130 Hypertensive heart and chronic kidney disease with heart failure and stage 1 through stage 4 chronic kidney disease, or unspecified chronic kidney disease: Secondary | ICD-10-CM | POA: Diagnosis not present

## 2018-02-12 DIAGNOSIS — I5032 Chronic diastolic (congestive) heart failure: Secondary | ICD-10-CM | POA: Diagnosis not present

## 2018-02-12 DIAGNOSIS — S92355D Nondisplaced fracture of fifth metatarsal bone, left foot, subsequent encounter for fracture with routine healing: Secondary | ICD-10-CM | POA: Diagnosis not present

## 2018-02-12 DIAGNOSIS — L03116 Cellulitis of left lower limb: Secondary | ICD-10-CM | POA: Diagnosis not present

## 2018-02-12 DIAGNOSIS — J449 Chronic obstructive pulmonary disease, unspecified: Secondary | ICD-10-CM | POA: Diagnosis not present

## 2018-02-13 ENCOUNTER — Ambulatory Visit (INDEPENDENT_AMBULATORY_CARE_PROVIDER_SITE_OTHER): Payer: Medicare Other | Admitting: Emergency Medicine

## 2018-02-13 ENCOUNTER — Encounter: Payer: Self-pay | Admitting: Emergency Medicine

## 2018-02-13 DIAGNOSIS — I2510 Atherosclerotic heart disease of native coronary artery without angina pectoris: Secondary | ICD-10-CM | POA: Diagnosis not present

## 2018-02-13 DIAGNOSIS — J449 Chronic obstructive pulmonary disease, unspecified: Secondary | ICD-10-CM

## 2018-02-13 MED ORDER — FLUTICASONE-UMECLIDIN-VILANT 100-62.5-25 MCG/INH IN AEPB: 1.0000 | INHALATION_SPRAY | Freq: Every day | RESPIRATORY_TRACT | 0 refills | Status: DC

## 2018-02-13 NOTE — Patient Instructions (Addendum)
We will perform walking oximetry today on room air.  We will plan to continue your Trelegy once daily. Rinse and gargle after using.  Please start taking your DuoNeb three times a day on a schedule.  Keep albuterol available to use 2 puffs up to every 4 hours if needed for shortness of breath, chest tightness, wheezing.  Flu shot and PNA shots are up to date.  Follow with Dr Lamonte Sakai in 1 month

## 2018-02-13 NOTE — Progress Notes (Signed)
GERD  Subjective:    Patient ID: MARCELLE Stewart, female    DOB: Sep 18, 1923, 84 y.o.   MRN: [id number]  COPD  She complains of shortness of breath. There is no cough or wheezing. Pertinent negatives include no chest pain, ear pain, fever, headaches, postnasal drip, rhinorrhea, sneezing, sore throat or trouble swallowing. Her past medical history is significant for COPD.   ROV 12/13/17 --84 year old woman, follows up today for evaluation of her severe COPD with frequent bronchitic symptoms, chronic cough.  She also has a history of atrial fibrillation, hypertension, chronic kidney disease and a hiatal hernia with GERD.  She was admitted in August with acute dyspnea most consistent with exacerbation of diastolic CHF.  She improved with diuresis.  We have been managing her on Trelegy since last visit. She uses DuoNeb about 3x a day. She has severe exertional SOB even with 30-40 ft. Coughs all day, prod of yellowish.   ROV 02/13/18 --[name] is 74 with a history of severe COPD, frequent bronchitic symptoms and chronic cough.  She also has a history of A. fib, hypertension with diastolic CHF, chronic kidney disease and a hiatal hernia with GERD.  We have been managing her on Trelegy and DuoNeb. She has never been qualified for o2. She describes some worsening in her overall exertional tolerance since last time. She has albuterol but does not use frequently. We had planned to use DuoNeb TID. She is coughing minimally.    Review of Systems  Constitutional: Negative for fever and unexpected weight change.  HENT: Positive for congestion. Negative for dental problem, ear pain, nosebleeds, postnasal drip, rhinorrhea, sinus pressure, sneezing, sore throat and trouble swallowing.   Eyes: Negative for redness and itching.  Respiratory: Positive for shortness of breath. Negative for cough, chest tightness and wheezing.   Cardiovascular: Positive for palpitations. Negative for chest pain and leg swelling.    Gastrointestinal: Negative for nausea and vomiting.  Genitourinary: Negative for dysuria.  Musculoskeletal: Negative for joint swelling.  Skin: Negative for rash.  Neurological: Negative for headaches.  Hematological: Does not bruise/bleed easily.  Psychiatric/Behavioral: Negative for dysphoric mood. The patient is nervous/anxious.         Objective:   Physical Exam  Vitals:   02/13/18 1049  BP: 122/82  Pulse: 97  SpO2: 95%  Weight: 174 lb 3.2 oz (79 kg)   Gen: Pleasant, elderly woman in no distress,  normal affect  ENT: No lesions,  mouth clear,  oropharynx clear, no postnasal drip, no stridor  Neck: No JVD, no stridor  Lungs: No use of accessory muscles, coarse B breath sounds, few basilar exp wheezes that cleared with a deep insp.   Cardiovascular: Irregular, heart sounds normal, no murmur or gallops, no peripheral edema  Musculoskeletal: No deformities, no cyanosis or clubbing  Neuro: alert, non focal  Skin: Warm, no lesions or rash      Assessment & Plan:  COPD, severe (HCC) Her cough and sputum production are actually fairly well-controlled.  She has persistent exertional dyspnea, seems to recover easily at rest.  Not clear to me that she delivers her Trelegy completely adequately which is why I have tried to treat also with scheduled DuoNeb.  No exacerbations since last time  We will perform walking oximetry today on room air.  We will plan to continue your Trelegy once daily. Rinse and gargle after using.  Please start taking your DuoNeb three times a day on a schedule.  Keep albuterol available to use 2  puffs up to every 4 hours if needed for shortness of breath, chest tightness, wheezing.  Flu shot and PNA shots are up to date.  Follow with Dr Lamonte Sakai in 1 month  Baltazar Apo, MD, PhD 02/13/2018, 11:13 AM  Pulmonary and Critical Care [phone] or if no answer [phone]

## 2018-02-13 NOTE — Assessment & Plan Note (Signed)
Her cough and sputum production are actually fairly well-controlled.  She has persistent exertional dyspnea, seems to recover easily at rest.  Not clear to me that she delivers her Trelegy completely adequately which is why I have tried to treat also with scheduled DuoNeb.  No exacerbations since last time  We will perform walking oximetry today on room air.  We will plan to continue your Trelegy once daily. Rinse and gargle after using.  Please start taking your DuoNeb three times a day on a schedule.  Keep albuterol available to use 2 puffs up to every 4 hours if needed for shortness of breath, chest tightness, wheezing.  Flu shot and PNA shots are up to date.  Follow with Dr Lamonte Sakai in 1 month

## 2018-02-14 ENCOUNTER — Telehealth: Payer: Self-pay

## 2018-02-14 DIAGNOSIS — I5032 Chronic diastolic (congestive) heart failure: Secondary | ICD-10-CM | POA: Diagnosis not present

## 2018-02-14 DIAGNOSIS — S92355D Nondisplaced fracture of fifth metatarsal bone, left foot, subsequent encounter for fracture with routine healing: Secondary | ICD-10-CM | POA: Diagnosis not present

## 2018-02-14 DIAGNOSIS — L03116 Cellulitis of left lower limb: Secondary | ICD-10-CM | POA: Diagnosis not present

## 2018-02-14 DIAGNOSIS — J449 Chronic obstructive pulmonary disease, unspecified: Secondary | ICD-10-CM | POA: Diagnosis not present

## 2018-02-14 DIAGNOSIS — N184 Chronic kidney disease, stage 4 (severe): Secondary | ICD-10-CM | POA: Diagnosis not present

## 2018-02-14 DIAGNOSIS — I130 Hypertensive heart and chronic kidney disease with heart failure and stage 1 through stage 4 chronic kidney disease, or unspecified chronic kidney disease: Secondary | ICD-10-CM | POA: Diagnosis not present

## 2018-02-14 NOTE — Telephone Encounter (Signed)
Confirmed remote transmission w/ pt daughter.   

## 2018-02-15 ENCOUNTER — Encounter: Payer: Self-pay | Admitting: Cardiology

## 2018-02-15 DIAGNOSIS — I5032 Chronic diastolic (congestive) heart failure: Secondary | ICD-10-CM | POA: Diagnosis not present

## 2018-02-15 DIAGNOSIS — L03116 Cellulitis of left lower limb: Secondary | ICD-10-CM | POA: Diagnosis not present

## 2018-02-15 DIAGNOSIS — N184 Chronic kidney disease, stage 4 (severe): Secondary | ICD-10-CM | POA: Diagnosis not present

## 2018-02-15 DIAGNOSIS — I130 Hypertensive heart and chronic kidney disease with heart failure and stage 1 through stage 4 chronic kidney disease, or unspecified chronic kidney disease: Secondary | ICD-10-CM | POA: Diagnosis not present

## 2018-02-15 DIAGNOSIS — S92355D Nondisplaced fracture of fifth metatarsal bone, left foot, subsequent encounter for fracture with routine healing: Secondary | ICD-10-CM | POA: Diagnosis not present

## 2018-02-15 DIAGNOSIS — J449 Chronic obstructive pulmonary disease, unspecified: Secondary | ICD-10-CM | POA: Diagnosis not present

## 2018-02-16 ENCOUNTER — Other Ambulatory Visit: Payer: Self-pay | Admitting: *Deleted

## 2018-02-16 NOTE — Patient Outreach (Signed)
[facility]) Care Management  02/16/2018  [name] 23-Jan-1924 [id number]    Transition of care contact today to inquire on pt's ongoing management of care. Discussed the current plan of care along with goals and interventions. Adjusted interventions based upon pt's progress and continue to encouraged preventiive measures by working with the [facility] agency with PT. Will verify sufficient transportation to all medical appointments and verify pt continue to attend with no missed appointments. Will also verified pt continue to take all prescribed medications with no needed refills today. RN will strongly continue to encouraged adherence to the above interventions and inquired on any needed resources or tools needed to continue her ongoing management of care. Will update care plan accordingly and reschedule another outreach call next week for transition of care.  THN CM Care Plan Problem One     Most Recent Value  Care Plan Problem One  Recent hospitalization related to left leg  Role Documenting the Problem One  Care Management [place] for Problem One  Active  THN Long Term Goal   Pt will be more knowledgable on prevention measures to avoid readmission over the next 60 days  THN Long Term Goal Start Date  01/24/18  Interventions for Problem One Long Term Goal  Will continue to enocuraged pt to work with [facility] services with prevented care concerning her recent discharge. Will continue to encouraged preventive measures and verified no acute events have occurred over the next week. Will follow up next week with ongoing management of care  [facility] CM Short Term Goal #1   Adherence with all scheduled medical appointments post discharge from SNF over the next 30 days  [facility] CM Short Term Goal #1 Start Date  01/24/18  [facility] CM Short Term Goal #1 Met Date  02/16/18     [name], RN Care Management Coordinator [place] Office [phone]

## 2018-02-19 DIAGNOSIS — I130 Hypertensive heart and chronic kidney disease with heart failure and stage 1 through stage 4 chronic kidney disease, or unspecified chronic kidney disease: Secondary | ICD-10-CM | POA: Diagnosis not present

## 2018-02-19 DIAGNOSIS — I5032 Chronic diastolic (congestive) heart failure: Secondary | ICD-10-CM | POA: Diagnosis not present

## 2018-02-19 DIAGNOSIS — L03116 Cellulitis of left lower limb: Secondary | ICD-10-CM | POA: Diagnosis not present

## 2018-02-19 DIAGNOSIS — S92355D Nondisplaced fracture of fifth metatarsal bone, left foot, subsequent encounter for fracture with routine healing: Secondary | ICD-10-CM | POA: Diagnosis not present

## 2018-02-19 DIAGNOSIS — J449 Chronic obstructive pulmonary disease, unspecified: Secondary | ICD-10-CM | POA: Diagnosis not present

## 2018-02-19 DIAGNOSIS — N184 Chronic kidney disease, stage 4 (severe): Secondary | ICD-10-CM | POA: Diagnosis not present

## 2018-02-22 ENCOUNTER — Other Ambulatory Visit: Payer: Self-pay | Admitting: *Deleted

## 2018-02-22 NOTE — Patient Outreach (Signed)
[facility]) Care Management  02/22/2018  [name] [birth year] [id number]    RN spoke with pt today and updated pt's ongoing management of care related to prevention measures to avoid readmissions. Also verified [facility] services with [facility]. Pt states PT will visit for one addition time next week prior to discharge. States her left leg is healed with no additional dressings changes however some swelling remains at a minimal (history). Pt states they are :much better". Reports ongoing breathing treatments 3 X daily for her COPD with no reported issues. Confirms taking all her medications as prescribed and attendance to all medical appointments with no missed appointments. No needed community resources at this time as pt managing her care well. Offered to follow up next week with ongoing transition of case as pt receptive and grateful for the calls. Will scheduled a follow up call next week.  THN CM Care Plan Problem One     Most Recent Value  Care Plan Problem One  Recent hospitalization related to left leg  Role Documenting the Problem One  Care Management [place] for Problem One  Active  THN Long Term Goal   Pt will be more knowledgable on prevention measures to avoid readmission over the next 60 days  THN Long Term Goal Start Date  01/24/18  Interventions for Problem One Long Term Goal  Will continue to confirm pt  attendance to all appointments and verifiy pariticpation with ongoing [facility] services and use of treatments for her COPD. Will continue to encouraged all preventive measures to avoid readmission. Will continue to encouraged adherence over the next week and re-evaluate pt's participation with such measures next week.       [name], RN Care Management Coordinator [place] Office [phone]

## 2018-02-23 ENCOUNTER — Ambulatory Visit: Payer: Medicare Other

## 2018-02-27 ENCOUNTER — Other Ambulatory Visit: Payer: Self-pay

## 2018-02-27 ENCOUNTER — Ambulatory Visit (INDEPENDENT_AMBULATORY_CARE_PROVIDER_SITE_OTHER): Payer: Medicare Other | Admitting: Vascular Surgery

## 2018-02-27 ENCOUNTER — Ambulatory Visit (HOSPITAL_COMMUNITY)
Admission: RE | Admit: 2018-02-27 | Discharge: 2018-02-27 | Disposition: A | Discharge status: Home / Self Care | Payer: Medicare Other | Source: Ambulatory Visit | Attending: Family Medicine | Admitting: Family Medicine

## 2018-02-27 ENCOUNTER — Encounter: Payer: Self-pay | Admitting: Vascular Surgery

## 2018-02-27 VITALS — HR 75 | Temp 97.2°F | Resp 20 | Ht 65.0 in | Wt 174.0 lb

## 2018-02-27 DIAGNOSIS — R600 Localized edema: Secondary | ICD-10-CM

## 2018-02-27 DIAGNOSIS — R609 Edema, unspecified: Secondary | ICD-10-CM | POA: Diagnosis not present

## 2018-02-27 DIAGNOSIS — S91302A Unspecified open wound, left foot, initial encounter: Secondary | ICD-10-CM

## 2018-02-27 DIAGNOSIS — I2510 Atherosclerotic heart disease of native coronary artery without angina pectoris: Secondary | ICD-10-CM

## 2018-02-27 NOTE — Progress Notes (Signed)
Requested by:  McDiarmid, Blane Ohara, MD [address] [place], [place] [address]  Reason for consultation: bilateral lower extremity edema    History of Present Illness   [name] is a 84 y.o. ([birth year]) female who presents with chief complaint: weakness and discomfort related to BLE edema.  Patient notes, onset of swelling 3 months ago.  Patient also states she had history of an ulceration near her medial to anterior ankle of her left leg however this has since healed.  She denies any history of DVT.  She is on Eliquis for atrial fibrillation.  She has tried compression stockings in the past however these were difficult to get on so she has not worn them since.  Her primary care physician recommended elevating her legs as much as possible during the day however patient admittedly has not put much effort into this.  Past medical history also significant for CKD stage III.  Patient states she is maxed out on her diuretic regimen.  Past Medical History:  Diagnosis Date  . Abnormal mammogram, unspecified 08/23/2010   Followup imaging reassuring.   . Acute appendicitis with rupture   . Acute on chronic diastolic heart failure ([place]) 10/16/2017  . ADENOMATOUS COLONIC POLYP 03/01/2003   Qualifier: Diagnosis of  By: McDiarmid MD, Sherren Mocha Multiple benign polyps of cecum, ascending, transverse and sigmoid colon by 8/09 colonoscopy by Dr Cristina Gong  Colonoscopy by Dr Cristina Gong for iron-deficiency anemia on 04/27/2010 showed three sessile polyps that were in ascending (3 mm x 9 mm), transverse (4 mm), and cecum (3 mm).  All three were tubular adenomas that were negative for high grade dysplasia or malignancy on pathology. Dr Cristina Gong called the polpys benign and not requiring follow-up in view of the patients age.    . Adrenal adenoma    Incidentaloma  . AF (paroxysmal atrial fibrillation) ([place]) 11/11/2010   Hospitalization (9/8-9/10, Dr Daneen Schick, III, Cardiology) for Paroxysmal Atrial Fibrillation with  RVR and anginal pain secondary to demand/supply mismatch in setting of RVR with known circumflex artery branch disease.    Marland Kitchen ANXIETY 04/27/2006   Qualifier: Diagnosis of  By: McDiarmid MD, Sherren Mocha    . Benign essential tremor 02/04/2016  . Candidiasis of the esophagus 11/26/2007  . CAP (community acquired pneumonia) 02/25/2015  . Cataract 2013   Bilateral   . Cellulitis of leg, right 10/01/2012  . Cellulitis of right lower extremity   . Chest pain with moderate risk of acute coronary syndrome 05/21/2015  . Chronic kidney disease (CKD), stage III (moderate) (HCC)   . Concussion with loss of consciousness   . COPD 04/27/2006   Qualifier: Diagnosis of  By: McDiarmid MD, Sherren Mocha    . COPD exacerbation ([place])   . COPD, severe   . Decreased functional mobility and endurance 03/17/2015  . Dementia [facility]), possible Alzhemier's type 10/26/2012   Montreal Cognitive Assessment (25 out of 30, points off in visuospatial and and short-term memory).  Independent in iADLs and ADLs.    . Diastolic heart failure ([place]) 07/30/2015  . DISC WITH RADICULOPATHY 04/27/2006   Qualifier: Diagnosis of  By: McDiarmid MD, Sherren Mocha    . Dyspnea   . EDEMA-LEGS,DUE TO VENOUS OBSTRUCT. 04/27/2006   Qualifier: Diagnosis of  By: McDiarmid MD, Sherren Mocha    . 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 Normal uric acid serum level at time of attack.    Marland Kitchen  HERNIA, HIATAL, NONCONGENITAL 04/27/2006   Qualifier: Diagnosis of  By: McDiarmid MD, Sherren Mocha    . High risk medications (not anticoagulants) long-term use 03/05/2012  . History of Hemorrhoids 04/27/2006   Qualifier: Diagnosis of  By: McDiarmid MD, Sherren Mocha    . History of iron deficiency 01/16/2015  . Hx of colonoscopy with polypectomy 04/27/2010   Dr Cristina Gong found three  tubular adenomas each less than 10 mm size  . Hypertension   . Hypotension 07/09/2015  . Impaired mobility and ADLs 09/01/2017  . Iron deficiency anemia 08/05/2010    Dr Cristina Gong (GI) has evaluated with EGD, colonoscopy, and video capsular endoscopy in 2011 & 2012.  All have been unrevealing as to an origin of IDA.  OV with Dr Cristina Gong (10/28/10) assessment of blood in stool per hemoccult and GER. Hbg 12.1 g/dL, MCV 91.8, Ferritin 30 ng/mL. Patient taking on ferrous sulfate tab daily.   EGD on 03/06/12 by Dr Cristina Gong for IDA non-obstructing Schatzki's ring at Gastroesophageal junction, otherwise normal esophagus and stomach.     . Leg cramps 05/29/2012  . Lumbar herniated disc    History of HNP L4/5 in 2003  . Macular degeneration, bilateral 10/04/2010   Right eye is wet MD, the other is dry macular degeneration (ARMD). Pt undergoing some form of vascular endothelial growth factor inhibition intraocular therapy.    . Mild cognitive impairment 10/26/2012   (10/25/12) Failed MiniCog screen  . MUSCLE CRAMPS 03/11/2010   Qualifier: Diagnosis of  By: McDiarmid MD, Sherren Mocha    . Muscle spasm of back 09/24/2013  . Myocardial infarct, old   . Nocturia 10/28/2016  . Numbness and tingling in hands 07/21/2011  . Pacemaker    MDT  . PREDIABETES 09/11/2007   Qualifier: Diagnosis of  By: McDiarmid MD, Sherren Mocha    . Retinal hemorrhage of left eye 06/2010  . RHINITIS, ALLERGIC 04/27/2006   Qualifier: Diagnosis of  By: McDiarmid MD, Sherren Mocha    . SCHATZKI'S RING, HX OF 11/26/2007   Qualifier: Diagnosis of  By: McDiarmid MD, Sherren Mocha  An EGD was performed by Dr Cristina Gong on 04/27/2010 for iron deficiency anemia. There was a a transient hiatal hernia with Schatzki's ring. Stomach and duodenum were normal. EGD on 03/06/12 by Dr Cristina Gong for IDA non-obstructing Schatzki's ring at Gastroesophageal junction, otherwise normal esophagus and stomach.    . Seborrheic keratosis, right anterior thigh 12/12/2013  . Shoulder pain, left 01/09/2014  . Sick sinus syndrome with tachycardia ([place])    MDT  . Soft tissue injury of foot 05/03/2011  . Solar lentigo 06/15/2012  . Solitary kidney, acquired 05/17/2010   Surgical removal  for transitional cell cancer by Tresa Endo, MD ([facility]). Surveillance cystoscopy by Dr Alinda Money [facility]) on 10/19/12 without evidence of cystoscopic recurrence. Recommend RTC one year for cystoscopy.   Marland Kitchen Spinal stenosis, lumbar   . Transitional cell carcinoma of ureter, history   . Urge incontinence 12/13/2011   Diagnosed in 10/2011 by Dr Bjorn Loser (Urology)   . VENTRICULAR HYPERTROPHY, LEFT 08/28/2008   Qualifier: Diagnosis of  By: McDiarmid MD, Sherren Mocha    . VITAMIN B12 DEFICIENCY 10/07/2009   Qualifier: Diagnosis of  By: McDiarmid MD, Sherren Mocha  Dx based on a post-TKR anemia work-up Low normal serum B12 with high Methylmalonic acid and homocysteine level  Vit B12 serum level (10/28/10) > 1500 pg/mL   . Vitamin D deficiency 11/02/2010   Serum vitamin D 25(OH) = 10.9 ng/mL (30 -100) on 10/28/10 c/w Vitamin D deficiency.  Past Surgical History:  Procedure Laterality Date  . BREAST SURGERY     breast reduction  . CARDIOVERSION N/A 10/20/2017   Procedure: CARDIOVERSION;  Surgeon: Josue Hector, MD;  Location: [facility] ENDOSCOPY;  Service: Cardiovascular;  Laterality: N/A;  . CHOLECYSTECTOMY    . CORONARY ANGIOPLASTY WITH STENT PLACEMENT  2010   BMS RCA, OM2 occluded  . CYSTOSCOPY  09/30/2015   No cystoscopic evidence of uroepithelial neoplasm.  Follow up surveillance cystoscopy in one year  . CYSTOSCOPY  10/13/2017   Dr Raynelle Bring [facility])  . DUPUYTREN CONTRACTURE RELEASE Right 05/22/2014   Procedure: DUPUYTREN RELEASE AND REPAIR AS NECESSARY RIGHT RING FINGER AND MIDDLE FINGER;  Surgeon: Roseanne Kaufman, MD;  Location: [place];  Service: Orthopedics;  Laterality: Right;  . ESOPHAGOGASTRODUODENOSCOPY  04/27/2010   Dr Cristina Gong - found transient H/H & Schatzki's ring  . ESOPHAGOGASTRODUODENOSCOPY ENDOSCOPY  03/06/2012   Dr Cristina Gong - found non-obstuctive Schatzki's ring at [employer] o/w normal EGD.   Marland Kitchen EYE SURGERY     bilateral cataracts  . KNEE ARTHROSCOPY W/ SYNOVECTOMY  11/2009, left  knee   Dr Wynelle Link  . NEPHRECTOMY  For transition cell cancer    Dr Tresa Endo, surgeon  . PACEMAKER GENERATOR CHANGE N/A 11/12/2012   Procedure: PACEMAKER GENERATOR CHANGE;  Surgeon: Evans Lance, MD; [employer] [facility] dual-chamber pacemaker serial number [id number]; Laterality: Right  . PACEMAKER INSERTION  2005   Dr Doreatha Lew  . PACEMAKER LEAD REMOVAL  2005   Removal and reinsertion of atrial and ventricular leads due to migration  . REPLACEMENT TOTAL KNEE  2009, Right knee   Dr Wynelle Link  . REPLACEMENT TOTAL KNEE  05/2009, Left knee   Dr Wynelle Link  . TONSILLECTOMY    . TRIGGER FINGER RELEASE Right 05/22/2014   Procedure: RIGHT HAND A-1 PULLEY RELEASE ;  Surgeon: Roseanne Kaufman, MD;  Location: [place];  Service: Orthopedics;  Laterality: Right;    Social History   Socioeconomic History  . Marital status: Widowed    Spouse name: Not on file  . Number of children: 4  . Years of education: Not on file  . Highest education level: Not on file  Occupational History  . 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 Financial resource strain: Not on file  . Food insecurity:    Worry: Not on file    Inability: Not on file  . Transportation needs:    Medical: Not on file    Non-medical: Not on file  Tobacco Use  . Smoking status: Former Smoker    Packs/day: 1.00    Types: Cigarettes    Last attempt to quit: 03/02/2009    Years since quitting: 8.9  . Smokeless tobacco: Never Used  Substance and Sexual Activity  . Alcohol use: Yes    Alcohol/week: 13.0 standard drinks    Types: 7 Glasses of wine, 6 Standard drinks or equivalent per week    Comment: 5 oz white wine per day  . Drug use: No  . Sexual activity: Never  Lifestyle  . Physical activity:    Days per week: Not on file    Minutes per session: Not on file  . Stress: Not on file  Relationships  . Social connections:    Talks on phone: Not on file    Gets together: Not on file     Attends religious service: Not on file    Active member of  club or organization: Not on file    Attends meetings of clubs or organizations: Not on file    Relationship status: Not on file  . Intimate partner violence:    Fear of current or ex partner: Not on file    Emotionally abused: Not on file    Physically abused: Not on file    Forced sexual activity: Not on file  Other Topics Concern  . 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    Semi-retired Futures trader.     Pt has home nebulizer for inhalation therapies.   Former Smoker   Smoking Status:  quit > 5 years ago      (+) DNR status per discussion with Dr McDiarmid 10/25/12 and reiterated 06/20/13 office visit .   (+) Living Will/Advance Directive   (+) HC-POA: Cecile Sheerer Causey (pt's dgt)      Current Social History 09/22/2016        Patient lives with daughter, [name] (flight attendant), in two level home 09/22/2016   Transportation: Patient has own vehicle 09/22/2016   Important Relationships 4 daughters and 25 yo grandson 09/22/2016    Pets: None 09/22/2016   Education / Work:  College/ Retired Futures trader 09/22/2016   Interests / Fun: Reads 2 books per week, watches TV, entertains 09/22/2016   Current Stressors: 84 yo house in need of outside repairs 09/22/2016   Religious / Personal Beliefs: Joanie [place]dington 09/22/2016   Other: Had a wonderful marriage/ Very thankful/ wants to continue traveling/ closest friends are all gone 09/22/2016   L. Ducatte, RN, BSN                                                                                                   Family History  Problem Relation Age of Onset  . Dementia Mother   . Heart disease Father   . Heart attack Father     Current Outpatient Medications  Medication Sig Dispense Refill    . albuterol (PROVENTIL) (2.5 MG/3ML) 0.083% nebulizer solution Take 3 mLs (2.5 mg total) by nebulization every 6 (six) hours as needed for wheezing or shortness of breath. 360 mL 11  . amiodarone (PACERONE) 200 MG tablet Take one tablet by mouth daily Monday through Friday.  Do not take on Saturday or Sunday. 90 tablet 3  . apixaban (ELIQUIS) 2.5 MG TABS tablet Take 1 tablet (2.5 mg total) by mouth 2 (two) times daily. 60 tablet 10  . atorvastatin (LIPITOR) 20 MG tablet TAKE 1 TABLET BY MOUTH DAILY 90 tablet 3  . fluticasone (FLONASE) 50 MCG/ACT nasal spray SHAKE LIQUID AND USE 2 SPRAYS IN EACH NOSTRIL DAILY 48 g 3  . Fluticasone-Umeclidin-Vilant (TRELEGY ELLIPTA) 100-62.5-25 MCG/INH AEPB Inhale into the lungs.    . Fluticasone-Umeclidin-Vilant (TRELEGY ELLIPTA) 100-62.5-25 MCG/INH AEPB Inhale 1 puff into the lungs daily. 14 each 0  . furosemide (  LASIX) 20 MG tablet Take 1 tablet (20 mg total) by mouth daily. (Patient taking differently: Take 20 mg by mouth daily. Takes as needed takes 1-2tablet) 90 tablet 3  . metoprolol succinate (TOPROL-XL) 25 MG 24 hr tablet Take 25 mg by mouth daily.    . nitroGLYCERIN (NITROSTAT) 0.4 MG SL tablet Place 1 tablet (0.4 mg total) under the tongue every 5 (five) minutes as needed for chest pain (x 3 tabs). 25 tablet PRN  . omeprazole (PRILOSEC) 20 MG capsule Take 1 capsule (20 mg total) by mouth daily. 90 capsule 3  . traMADol (ULTRAM) 50 MG tablet TAKE 1 TABLET BY MOUTH EVERY 6 HOURS AS NEEDED FOR PAIN 30 tablet 2  . vitamin B-12 (CYANOCOBALAMIN) 1000 MCG tablet Take 1 tablet (1,000 mcg total) by mouth daily. 90 tablet 3   No current facility-administered medications for this visit.     Allergies  Allergen Reactions  . Baclofen Other (See Comments)    Confusion occurred with taking 3 at the same time  . Codeine Phosphate Nausea Only  . Hydrochlorothiazide Other (See Comments)    Possible acute gout or arthritis of wrist  . Montelukast Sodium Other (See  Comments)    Reaction not recalled ??  Lebron Quam [Hydrocodone-Acetaminophen] Nausea And Vomiting    REVIEW OF SYSTEMS (negative unless checked):   Cardiac:  []  Chest pain or chest pressure? [x]  Shortness of breath upon activity? []  Shortness of breath when lying flat? []  Irregular heart rhythm?  Vascular:  []  Pain in calf, thigh, or hip brought on by walking? []  Pain in feet at night that wakes you up from your sleep? []  Blood clot in your veins? [x]  Leg swelling?  Pulmonary:  []  Oxygen at home? [x]  Productive cough? []  Wheezing?  Neurologic:  []  Sudden weakness in arms or legs? []  Sudden numbness in arms or legs? []  Sudden onset of difficult speaking or slurred speech? []  Temporary loss of vision in one eye? []  Problems with dizziness?  Gastrointestinal:  []  Blood in stool? []  Vomited blood?  Genitourinary:  []  Burning when urinating? []  Blood in urine?  Psychiatric:  []  Major depression  Hematologic:  []  Bleeding problems? []  Problems with blood clotting?  Dermatologic:  []  Rashes or ulcers?  Constitutional:  []  Fever or chills?  Ear/Nose/Throat:  []  Change in hearing? []  Nose bleeds? []  Sore throat?  Musculoskeletal:  []  Back pain? [x]  Joint pain? [x]  Muscle pain?   Physical Examination     Vitals:   02/27/18 1446  Pulse: 75  Resp: 20  Temp: (!) 97.2 F (36.2 C)  SpO2: 96%  Weight: 174 lb (78.9 kg)  Height: 5\' 5"  (1.651 m)   Body mass index is 28.96 kg/m.  General Alert, O x 3, WD, Elderly  Pulmonary Sym exp, good B air movt  Cardiac RRR, Nl S1, S2  Vascular Vessel Right Left  Radial Palpable Palpable  Popliteal Not palpable Not palpable  PT Not palpable Not palpable  DP Faintly palpable Faintly palpable    Gastro- intestinal soft, non-distended, non-tender to palpation,   Musculo- skeletal M/S 5/5 throughout  , Extremities without ischemic changes  , Pitting edema present: to the level of the knee bilaterally, No visible  varicosities , No Lipodermatosclerosis present  Neurologic Cranial nerves 2-12 intact , Pain and light touch intact in extremities , Motor exam as listed above  Psychiatric Judgement intact, Mood & affect appropriate for pt's clinical situation  Dermatologic See M/S exam for extremity exam,  No rashes otherwise noted  Lymphatic  Palpable lymph nodes: None    Non-invasive Vascular Imaging   BLE Venous Insufficiency Duplex   BLE negative for DVT or SFTP  BLE also negative for venous insufficiency   Medical Decision Making   AZURI BOZARD is a 84 y.o. female who presents with: BLE edema   This patient was seen in conjunction with Dr. Donnetta Hutching  Venous reflux study negative for significant deep or superficial reflux bilaterally; also negative for DVT or SFTP bilaterally  Recommended elevation of BLE when possible and compression if tolerable  Re-assured patient that the use of these conservative treatment measures is exclusively related to her symptoms and not absolutely neccessary if she can tolerate her edema  She may follow up on an as needed basis   Dagoberto Ligas PA-C [facility] Office: [phone]  02/27/2018, 3:25 PM

## 2018-03-01 ENCOUNTER — Other Ambulatory Visit: Payer: Self-pay | Admitting: *Deleted

## 2018-03-01 DIAGNOSIS — H353231 Exudative age-related macular degeneration, bilateral, with active choroidal neovascularization: Secondary | ICD-10-CM | POA: Diagnosis not present

## 2018-03-01 DIAGNOSIS — H353211 Exudative age-related macular degeneration, right eye, with active choroidal neovascularization: Secondary | ICD-10-CM | POA: Diagnosis not present

## 2018-03-01 DIAGNOSIS — H43813 Vitreous degeneration, bilateral: Secondary | ICD-10-CM | POA: Diagnosis not present

## 2018-03-01 DIAGNOSIS — H35033 Hypertensive retinopathy, bilateral: Secondary | ICD-10-CM | POA: Diagnosis not present

## 2018-03-01 NOTE — Patient Outreach (Signed)
[facility]) Care Management  03/01/2018  CAY KATH [phone]    Outreach attempt  to patient. No answer. RN CM left HIPAA compliant voicemail message along with contact info. Will reschedule another outreach call.  [name], RN Care Management Coordinator [place] Office [phone]

## 2018-03-06 ENCOUNTER — Ambulatory Visit: Payer: Self-pay

## 2018-03-06 DIAGNOSIS — S92355D Nondisplaced fracture of fifth metatarsal bone, left foot, subsequent encounter for fracture with routine healing: Secondary | ICD-10-CM | POA: Diagnosis not present

## 2018-03-06 DIAGNOSIS — J449 Chronic obstructive pulmonary disease, unspecified: Secondary | ICD-10-CM | POA: Diagnosis not present

## 2018-03-06 DIAGNOSIS — I5032 Chronic diastolic (congestive) heart failure: Secondary | ICD-10-CM | POA: Diagnosis not present

## 2018-03-06 DIAGNOSIS — I130 Hypertensive heart and chronic kidney disease with heart failure and stage 1 through stage 4 chronic kidney disease, or unspecified chronic kidney disease: Secondary | ICD-10-CM | POA: Diagnosis not present

## 2018-03-06 DIAGNOSIS — L03116 Cellulitis of left lower limb: Secondary | ICD-10-CM | POA: Diagnosis not present

## 2018-03-06 DIAGNOSIS — N184 Chronic kidney disease, stage 4 (severe): Secondary | ICD-10-CM | POA: Diagnosis not present

## 2018-03-15 DIAGNOSIS — L03116 Cellulitis of left lower limb: Secondary | ICD-10-CM | POA: Diagnosis not present

## 2018-03-15 DIAGNOSIS — I5032 Chronic diastolic (congestive) heart failure: Secondary | ICD-10-CM | POA: Diagnosis not present

## 2018-03-15 DIAGNOSIS — N184 Chronic kidney disease, stage 4 (severe): Secondary | ICD-10-CM | POA: Diagnosis not present

## 2018-03-15 DIAGNOSIS — S92355D Nondisplaced fracture of fifth metatarsal bone, left foot, subsequent encounter for fracture with routine healing: Secondary | ICD-10-CM | POA: Diagnosis not present

## 2018-03-15 DIAGNOSIS — I130 Hypertensive heart and chronic kidney disease with heart failure and stage 1 through stage 4 chronic kidney disease, or unspecified chronic kidney disease: Secondary | ICD-10-CM | POA: Diagnosis not present

## 2018-03-15 DIAGNOSIS — J449 Chronic obstructive pulmonary disease, unspecified: Secondary | ICD-10-CM | POA: Diagnosis not present

## 2018-03-19 ENCOUNTER — Other Ambulatory Visit: Payer: Self-pay | Admitting: *Deleted

## 2018-03-19 NOTE — Patient Outreach (Signed)
[facility]) Care Management  03/19/2018  [name] 03-06-1923 [id number]  Telephone Assessment  RN unsuccessful outreach attempt however was able to leave a HIPAA approved voice message requesting a call back. Will send outreach letter and attempt to follow up once again over the next week.  [name], RN Care Management Coordinator [place] Office [phone]

## 2018-03-20 ENCOUNTER — Ambulatory Visit
Admission: RE | Admit: 2018-03-20 | Discharge: 2018-03-20 | Disposition: A | Discharge status: Home / Self Care | Payer: Medicare Other | Source: Ambulatory Visit | Attending: Emergency Medicine | Admitting: Emergency Medicine

## 2018-03-20 ENCOUNTER — Ambulatory Visit (INDEPENDENT_AMBULATORY_CARE_PROVIDER_SITE_OTHER): Payer: Medicare Other | Admitting: Emergency Medicine

## 2018-03-20 ENCOUNTER — Encounter: Payer: Self-pay | Admitting: Emergency Medicine

## 2018-03-20 DIAGNOSIS — S6991XA Unspecified injury of right wrist, hand and finger(s), initial encounter: Secondary | ICD-10-CM | POA: Diagnosis not present

## 2018-03-20 DIAGNOSIS — J449 Chronic obstructive pulmonary disease, unspecified: Secondary | ICD-10-CM

## 2018-03-20 DIAGNOSIS — M79641 Pain in right hand: Secondary | ICD-10-CM | POA: Diagnosis not present

## 2018-03-20 HISTORY — DX: Unspecified injury of right wrist, hand and finger(s), initial encounter: S69.91XA

## 2018-03-20 MED ORDER — AEROCHAMBER MV MISC: 0 refills | Status: DC

## 2018-03-20 MED ORDER — FLUTICASONE-UMECLIDIN-VILANT 100-62.5-25 MCG/INH IN AEPB: 1.0000 | INHALATION_SPRAY | Freq: Every day | RESPIRATORY_TRACT | 0 refills | Status: DC

## 2018-03-20 NOTE — Progress Notes (Signed)
GERD  Subjective:    Patient ID: [name], female    DOB: 14-Dec-1923, 85 y.o.   MRN: [id number]  COPD  She complains of shortness of breath. There is no cough or wheezing. Pertinent negatives include no chest pain, ear pain, fever, headaches, postnasal drip, rhinorrhea, sneezing, sore throat or trouble swallowing. Her past medical history is significant for COPD.    ROV 02/13/18 --[name] is 32 with a history of severe COPD, frequent bronchitic symptoms and chronic cough.  She also has a history of A. fib, hypertension with diastolic CHF, chronic kidney disease and a hiatal hernia with GERD.  We have been managing her on Trelegy and DuoNeb. She has never been qualified for o2. She describes some worsening in her overall exertional tolerance since last time. She has albuterol but does not use frequently. We had planned to use DuoNeb TID. She is coughing minimally.   ROV 03/20/18 --follow-up visit for [name], 85 year old woman with a history of severe COPD with a bronchitic phenotype, frequent bronchitic symptoms, chronic cough.  She is also followed and treated for atrial fibrillation, chronic kidney disease, hypertension plus diastolic CHF, hiatal hernia with GERD. She is having a lot of exertional dyspnea, also more sx when it is cold out. She is using Trelegy, has DuoNeb available - uses 2-3x a day. Minimal cough, she does hear wheeze frequently. She fell yesterday and hurt her R hand, skin tear. It still hurts her.    Review of Systems  Constitutional: Negative for fever and unexpected weight change.  HENT: Positive for congestion. Negative for dental problem, ear pain, nosebleeds, postnasal drip, rhinorrhea, sinus pressure, sneezing, sore throat and trouble swallowing.   Eyes: Negative for redness and itching.  Respiratory: Positive for shortness of breath. Negative for cough, chest tightness and wheezing.   Cardiovascular: Positive for palpitations. Negative for chest pain and leg  swelling.  Gastrointestinal: Negative for nausea and vomiting.  Genitourinary: Negative for dysuria.  Musculoskeletal: Negative for joint swelling.  Skin: Negative for rash.  Neurological: Negative for headaches.  Hematological: Does not bruise/bleed easily.  Psychiatric/Behavioral: Negative for dysphoric mood. The patient is nervous/anxious.         Objective:   Physical Exam  Vitals:   03/20/18 1037  BP: 124/86  Pulse: 66  SpO2: 98%  Weight: 176 lb (79.8 kg)   Gen: Pleasant, elderly woman in no distress,  normal affect  ENT: No lesions,  mouth clear,  oropharynx clear, no postnasal drip, UA noise today  Neck: No JVD, no stridor  Lungs: No use of accessory muscles, referred UA noised and also some exp wheezes  Cardiovascular: Irregular, heart sounds normal, no murmur or gallops, no peripheral edema  Musculoskeletal: skin tear and bruising at lateral 5th finger on R Hand.   Neuro: alert, non focal  Skin: Warm, no lesions or rash      Assessment & Plan:  COPD, severe (HCC) Severe disease, no exacerbations but she has daily intrusive symptoms.  She has both upper and lower airway noise.  She is unclear and I am unclear as to whether she is getting Trelegy down effectively.  We are also using DuoNeb on a schedule.  She has albuterol that she can use as needed.  We will form a walking oximetry today to see if she qualifies for a POC.  She brought her walker with her.  Please continue your Trelegy  We will use DuoNeb three times a day on a schedule.  Keep your albuterol available to use 2 puffs if needed for shortness of breath We will try using a spacer with your inhaler.  We will perform walking oxygen test today on room air.  Avoid smoke at all costs.  Follow with Dr Lamonte Sakai in 2 months or sooner if you have any problems.     Hand trauma, right, initial encounter She fell last night and injured her right hand.  There is a skin tear and also bruising.  I do not see  much swelling.  We will dressed the skin after cleaning with chlorhexidine.  I think she needs an x-ray of the hand to ensure no occult fracture.  Baltazar Apo, MD, PhD 03/20/2018, 10:58 AM [facility] [phone] or if no answer [phone]

## 2018-03-20 NOTE — Patient Instructions (Addendum)
Please continue your Trelegy  We will use DuoNeb three times a day on a schedule.  Keep your albuterol available to use 2 puffs if needed for shortness of breath We will try using a spacer with your inhaler.  We will perform walking oxygen test today on room air.  Avoid smoke at all costs.  We will do an X-ray of your right hand.  Follow with Dr Lamonte Sakai in 2 months or sooner if you have any problems.

## 2018-03-20 NOTE — Progress Notes (Signed)
Patient seen in the office today and instructed on use of Aerochamber Spacer. Patient expressed understanding and demonstrated technique.

## 2018-03-20 NOTE — Assessment & Plan Note (Signed)
Severe disease, no exacerbations but she has daily intrusive symptoms.  She has both upper and lower airway noise.  She is unclear and I am unclear as to whether she is getting Trelegy down effectively.  We are also using DuoNeb on a schedule.  She has albuterol that she can use as needed.  We will form a walking oximetry today to see if she qualifies for a POC.  She brought her walker with her.  Please continue your Trelegy  We will use DuoNeb three times a day on a schedule.  Keep your albuterol available to use 2 puffs if needed for shortness of breath We will try using a spacer with your inhaler.  We will perform walking oxygen test today on room air.  Avoid smoke at all costs.  Follow with Dr Lamonte Sakai in 2 months or sooner if you have any problems.

## 2018-03-20 NOTE — Assessment & Plan Note (Signed)
She fell last night and injured her right hand.  There is a skin tear and also bruising.  I do not see much swelling.  We will dressed the skin after cleaning with chlorhexidine.  I think she needs an x-ray of the hand to ensure no occult fracture.

## 2018-03-21 ENCOUNTER — Other Ambulatory Visit: Payer: Self-pay | Admitting: *Deleted

## 2018-03-21 ENCOUNTER — Telehealth: Payer: Self-pay

## 2018-03-21 DIAGNOSIS — N184 Chronic kidney disease, stage 4 (severe): Secondary | ICD-10-CM | POA: Diagnosis not present

## 2018-03-21 DIAGNOSIS — J449 Chronic obstructive pulmonary disease, unspecified: Secondary | ICD-10-CM | POA: Diagnosis not present

## 2018-03-21 DIAGNOSIS — S92355D Nondisplaced fracture of fifth metatarsal bone, left foot, subsequent encounter for fracture with routine healing: Secondary | ICD-10-CM | POA: Diagnosis not present

## 2018-03-21 DIAGNOSIS — L03116 Cellulitis of left lower limb: Secondary | ICD-10-CM | POA: Diagnosis not present

## 2018-03-21 DIAGNOSIS — I5032 Chronic diastolic (congestive) heart failure: Secondary | ICD-10-CM | POA: Diagnosis not present

## 2018-03-21 DIAGNOSIS — I130 Hypertensive heart and chronic kidney disease with heart failure and stage 1 through stage 4 chronic kidney disease, or unspecified chronic kidney disease: Secondary | ICD-10-CM | POA: Diagnosis not present

## 2018-03-21 NOTE — Patient Outreach (Addendum)
[facility]) Care Management  03/21/2018  [name] [birth year] [id number]    RN spoke with the pt's daughter [name]) today and received an update on pt's progress. States pt is current working with [employer] PT today. States pt recent had a fall. Pt had an x-ray pending result but no interventions mentioned. States pt injured her thumb but no swelling or discoloration at this time. RN encouraged caregiver to view the site daily and report any abnormal swelling and severe pain to the provider. States the pt's pulmonologist examined the site yesterday on an office visit.   Discussed current plan of care and verified goals met however inquired further on the recent event and offered to follow up next month concerning pt's injured thumb pending x-rays. RN requested daughter to call RN if she has any inquires or related questions to any interventions the provider's office may instruct her to complete. Will generate a new goal surrounding the recent event of safety measures due to pt's recent fall with the reported injury. No other inquiries or requested needs at this time. Will scheduled a follow up call next month.  [facility]N CM Care Plan Problem One     Most Recent Value  Care Plan Problem One  Recent hospitalization related to left leg  Role Documenting the Problem One  Care Management [place] for Problem One  Not Active  [facility] Long Term Goal   Pt will be more knowledgable on prevention measures to avoid readmission over the next 60 days  [facility]N Long Term Goal Start Date  01/24/18  [facility] Long Term Goal Met Date  03/21/18    [facility] CM Care Plan Problem Two     Most Recent Value  Care Plan Problem Two  Safety prevention related to recent fall  Role Documenting the Problem Two  Care Management Coordinator  Care Plan for Problem Two  Active  [facility] CM Short Term Goal #1   Pt will use precautionary measures to prevent future falls over the next 30 days  [facility]N CM Short Term Goal #1 Start  Date  03/21/18  Interventions for Short Term Goal #2   Will discussed use of DME and confirm pt continue to work with PT via [place]. Will also encouraged pt to report any swelling, discoloration to the affect area or injuried site (thumb).       [name], RN Care Management Coordinator [place] Office [phone]

## 2018-03-21 NOTE — Telephone Encounter (Signed)
Betsy from [facility] called to let Dr. McDiarmid know that a couple of days ago, pt was trying to get out of her car, stepped on the curb and fell backward. She went to pulmonologist and X-rays were done. The only complaint pt has is her wrist is sore. If you need to speak to [facility], please call [phone]. Ottis Stain, CMA

## 2018-04-12 ENCOUNTER — Ambulatory Visit (INDEPENDENT_AMBULATORY_CARE_PROVIDER_SITE_OTHER): Payer: Medicare Other | Admitting: *Deleted

## 2018-04-12 DIAGNOSIS — I495 Sick sinus syndrome: Secondary | ICD-10-CM

## 2018-04-12 DIAGNOSIS — I480 Paroxysmal atrial fibrillation: Secondary | ICD-10-CM

## 2018-04-12 DIAGNOSIS — Z950 Presence of cardiac pacemaker: Secondary | ICD-10-CM | POA: Diagnosis not present

## 2018-04-12 NOTE — Patient Instructions (Addendum)
Please compare your home medication list with the list we have on file. Call the [facility] at [phone] with any changes.

## 2018-04-13 ENCOUNTER — Telehealth: Payer: Self-pay | Admitting: *Deleted

## 2018-04-13 ENCOUNTER — Other Ambulatory Visit: Payer: Self-pay | Admitting: Emergency Medicine

## 2018-04-13 DIAGNOSIS — J449 Chronic obstructive pulmonary disease, unspecified: Secondary | ICD-10-CM

## 2018-04-13 LAB — CUP PACEART INCLINIC DEVICE CHECK
Battery Impedance: 634 Ohm
Battery Remaining Longevity: 79 mo
Battery Voltage: 2.79 V
Brady Statistic AP VP Percent: 8 %
Brady Statistic AP VS Percent: 3 %
Brady Statistic AS VP Percent: 1 %
Brady Statistic AS VS Percent: 88 %
Date Time Interrogation Session: 20200213154437
Implantable Lead Implant Date: 20050523
Implantable Lead Implant Date: 20050523
Implantable Lead Location: 753859
Implantable Lead Location: 753860
Implantable Lead Model: 5076
Implantable Lead Model: 5076
Implantable Pulse Generator Implant Date: 20140915
Lead Channel Impedance Value: 503 Ohm
Lead Channel Impedance Value: 759 Ohm
Lead Channel Pacing Threshold Amplitude: 0.75 V
Lead Channel Pacing Threshold Pulse Width: 0.4 ms
Lead Channel Sensing Intrinsic Amplitude: 0.5 mV
Lead Channel Sensing Intrinsic Amplitude: 22.4 mV
Lead Channel Setting Pacing Amplitude: 2 V
Lead Channel Setting Pacing Amplitude: 2 V
Lead Channel Setting Pacing Pulse Width: 0.4 ms
MDC IDC SET LEADCHNL RV SENSING SENSITIVITY: 5.6 mV

## 2018-04-13 NOTE — Telephone Encounter (Signed)
Spoke with [name], advised patient is due for f/u with Dr. Lovena Le per recall. [name] requests that a scheduler call her on Monday to set this up as she isn't home right now. She thanked me for my call.  Patient saw DC on 2/13 for a PPM check and home monitor troubleshooting, so she will not likely need a PPM check at appt with Dr. Lovena Le.

## 2018-04-13 NOTE — Progress Notes (Signed)
Pacemaker check in clinic. Normal device function. Thresholds, sensing, impedances consistent with previous measurements. Device programmed to maximize longevity. 96.9% AT/AF burden, +Eliquis, patient unsure if she is still taking amiodarone, plans to call our office with updated med list. No high ventricular rates noted. Device programmed at appropriate safety margins. Histogram distribution appropriate for patient activity level. Device programmed to optimize intrinsic conduction. Estimated longevity 6.5 years. Patient enrolled in remote follow-up, assisted daughter with troubleshooting home monitor. Patient education completed. Carelink on 07/12/18 and ROV with GT next available (per recall).

## 2018-04-16 DIAGNOSIS — H43813 Vitreous degeneration, bilateral: Secondary | ICD-10-CM | POA: Diagnosis not present

## 2018-04-16 DIAGNOSIS — H353231 Exudative age-related macular degeneration, bilateral, with active choroidal neovascularization: Secondary | ICD-10-CM | POA: Diagnosis not present

## 2018-04-16 DIAGNOSIS — H35033 Hypertensive retinopathy, bilateral: Secondary | ICD-10-CM | POA: Diagnosis not present

## 2018-04-17 ENCOUNTER — Telehealth: Payer: Self-pay | Admitting: Internal Medicine

## 2018-04-17 NOTE — Telephone Encounter (Signed)
Not sure if pt's daughter may be calling about getting a sooner appt for device check. I will send message to device to follow up.

## 2018-04-17 NOTE — Telephone Encounter (Signed)
Spoke with daughter apt scheduled for 05/29/2018 at 9:00am with GT

## 2018-04-17 NOTE — Telephone Encounter (Signed)
New Message:    patient  Daughter returning call concerning a appt sooner for her mother. Please call patient daughter back.

## 2018-04-20 ENCOUNTER — Other Ambulatory Visit: Payer: Self-pay | Admitting: *Deleted

## 2018-04-20 NOTE — Patient Outreach (Signed)
[facility]) Care Management  04/20/2018  LESSLIE MOSSA 12-01-1923 [id number]   Case Closures  Spoke with pt's daughter [name]) who indicated pt is "doing well with everything". Denies any additional falls and pt continues to use her DME when ambulating to prevent future falls. States [facility] PT services has ended. X-ray from previous fall last month was clear and pt's thumb healed with no additional issues. Plan of care discussed as all goals have been met with no additional events or acute issues to address at this time. RN verify contacts to [facility] if needed in the future with plans to graduate pt from [facility] services at this time. Aware pt's provider will be aware of today's discharge with no additional issues to address. Case will be closed.  [name], RN Care Management Coordinator [place] Office [phone]

## 2018-04-28 ENCOUNTER — Other Ambulatory Visit: Payer: Self-pay | Admitting: Emergency Medicine

## 2018-05-10 ENCOUNTER — Other Ambulatory Visit: Payer: Self-pay

## 2018-05-10 ENCOUNTER — Ambulatory Visit (INDEPENDENT_AMBULATORY_CARE_PROVIDER_SITE_OTHER): Payer: Medicare Other | Admitting: Internal Medicine

## 2018-05-10 ENCOUNTER — Encounter: Payer: Self-pay | Admitting: Internal Medicine

## 2018-05-10 VITALS — BP 114/70 | HR 92 | Ht 65.0 in | Wt 160.0 lb

## 2018-05-10 DIAGNOSIS — I5033 Acute on chronic diastolic (congestive) heart failure: Secondary | ICD-10-CM | POA: Diagnosis not present

## 2018-05-10 DIAGNOSIS — I4891 Unspecified atrial fibrillation: Secondary | ICD-10-CM | POA: Diagnosis not present

## 2018-05-10 NOTE — Patient Instructions (Signed)
Medication Instructions:  No changes  If you need a refill on your cardiac medications before your next appointment, please call your pharmacy.   Lab work: none If you have labs (blood work) drawn today and your tests are completely normal, you will receive your results only by: Marland Kitchen MyChart Message (if you have MyChart) OR . A paper copy in the mail If you have any lab test that is abnormal or we need to change your treatment, we will call you to review the results.  Testing/Procedures: none  Follow-Up: At [facility], you and your health needs are our priority.  As part of our continuing mission to provide you with exceptional heart care, we have created designated Provider Care Teams.  These Care Teams include your primary Cardiologist (physician) and Advanced Practice Providers (APPs -  Physician Assistants and Nurse Practitioners) who all work together to provide you with the care you need, when you need it. You will need a follow up appointment in 12 months.  Please call our office 2 months in advance to schedule this appointment.  You may see Cristopher Peru, MD or one of the following Advanced Practice Providers on your designated Care Team:   Chanetta Marshall, NP . Tommye Standard, PA-C  Any Other Special Instructions Will Be Listed Below (If Applicable).

## 2018-05-10 NOTE — Addendum Note (Signed)
Addended by: Rose Phi on: 05/10/2018 04:55 PM   Modules accepted: Orders

## 2018-05-10 NOTE — Progress Notes (Signed)
HPI [name] returns today for followup. She is a pleasant 85 yo woman with persistent atrial fib, HTN, and CHB, s/p PPM insertion. In the interim, she has been in the hospital with COPD flair. She denies chest pain. She admits to sodium indiscretion.  Allergies  Allergen Reactions  . Baclofen Other (See Comments)    Confusion occurred with taking 3 at the same time  . Codeine Phosphate Nausea Only  . Hydrochlorothiazide Other (See Comments)    Possible acute gout or arthritis of wrist  . Montelukast Sodium Other (See Comments)    Reaction not recalled ??  Lebron Quam [Hydrocodone-Acetaminophen] Nausea And Vomiting     Current Outpatient Medications  Medication Sig Dispense Refill  . albuterol (PROVENTIL) (2.5 MG/3ML) 0.083% nebulizer solution USE 1 VIAL VIA NEBULIZER EVERY 4 HOURS AS NEEDED FOR WHEEZING OR SHORTNESS OF BREATH 360 mL 5  . amiodarone (PACERONE) 200 MG tablet Take one tablet by mouth daily Monday through Friday.  Do not take on Saturday or Sunday. 90 tablet 3  . apixaban (ELIQUIS) 2.5 MG TABS tablet Take 1 tablet (2.5 mg total) by mouth 2 (two) times daily. 60 tablet 10  . atorvastatin (LIPITOR) 20 MG tablet TAKE 1 TABLET BY MOUTH DAILY 90 tablet 3  . cefUROXime (CEFTIN) 250 MG tablet TAKE 1 TABLET(250 MG) BY MOUTH TWICE DAILY WITH A MEAL 14 tablet 2  . doxycycline (VIBRA-TABS) 100 MG tablet TAKE 1 TABLET(100 MG) BY MOUTH TWICE DAILY 14 tablet 2  . fluticasone (FLONASE) 50 MCG/ACT nasal spray SHAKE LIQUID AND USE 2 SPRAYS IN EACH NOSTRIL DAILY 48 g 3  . Fluticasone-Umeclidin-Vilant (TRELEGY ELLIPTA) 100-62.5-25 MCG/INH AEPB Inhale into the lungs.    . Fluticasone-Umeclidin-Vilant (TRELEGY ELLIPTA) 100-62.5-25 MCG/INH AEPB Inhale 1 puff into the lungs daily. 14 each 0  . Fluticasone-Umeclidin-Vilant (TRELEGY ELLIPTA) 100-62.5-25 MCG/INH AEPB Inhale 1 puff into the lungs daily. 28 each 0  . furosemide (LASIX) 20 MG tablet Take 1 tablet (20 mg total) by mouth daily.  (Patient taking differently: Take 20 mg by mouth daily. Takes as needed takes 1-2tablet) 90 tablet 3  . metoprolol succinate (TOPROL-XL) 25 MG 24 hr tablet Take 25 mg by mouth daily.    . nitroGLYCERIN (NITROSTAT) 0.4 MG SL tablet Place 1 tablet (0.4 mg total) under the tongue every 5 (five) minutes as needed for chest pain (x 3 tabs). 25 tablet PRN  . omeprazole (PRILOSEC) 20 MG capsule Take 1 capsule (20 mg total) by mouth daily. 90 capsule 3  . Spacer/Aero-Holding Chambers (AEROCHAMBER MV) inhaler Use as instructed 1 each 0  . traMADol (ULTRAM) 50 MG tablet TAKE 1 TABLET BY MOUTH EVERY 6 HOURS AS NEEDED FOR PAIN 30 tablet 2  . vitamin B-12 (CYANOCOBALAMIN) 1000 MCG tablet Take 1 tablet (1,000 mcg total) by mouth daily. 90 tablet 3   No current facility-administered medications for this visit.      Past Medical History:  Diagnosis Date  . Abnormal mammogram, unspecified 08/23/2010   Followup imaging reassuring.   . Acute appendicitis with rupture   . Acute on chronic diastolic heart failure ([place]) 10/16/2017  . ADENOMATOUS COLONIC POLYP 03/01/2003   Qualifier: Diagnosis of  By: McDiarmid MD, Sherren Mocha Multiple benign polyps of cecum, ascending, transverse and sigmoid colon by 8/09 colonoscopy by Dr Cristina Gong  Colonoscopy by Dr Cristina Gong for iron-deficiency anemia on 04/27/2010 showed three sessile polyps that were in ascending (3 mm x 9 mm), transverse (4 mm), and cecum (3 mm).  All three were tubular adenomas that were negative for high grade dysplasia or malignancy on pathology. Dr Cristina Gong called the polpys benign and not requiring follow-up in view of the patients age.    . Adrenal adenoma    Incidentaloma  . AF (paroxysmal atrial fibrillation) ([place]) 11/11/2010   Hospitalization (9/8-9/10, Dr Daneen Schick, III, Cardiology) for Paroxysmal Atrial Fibrillation with RVR and anginal pain secondary to demand/supply mismatch in setting of RVR with known circumflex artery branch disease.    Marland Kitchen ANXIETY 04/27/2006    Qualifier: Diagnosis of  By: McDiarmid MD, Sherren Mocha    . Benign essential tremor 02/04/2016  . Candidiasis of the esophagus 11/26/2007  . CAP (community acquired pneumonia) 02/25/2015  . Cataract 2013   Bilateral   . Cellulitis of leg, right 10/01/2012  . Cellulitis of right lower extremity   . Chest pain with moderate risk of acute coronary syndrome 05/21/2015  . Chronic kidney disease (CKD), stage III (moderate) (HCC)   . Concussion with loss of consciousness   . COPD 04/27/2006   Qualifier: Diagnosis of  By: McDiarmid MD, Sherren Mocha    . COPD exacerbation ([place])   . COPD, severe   . Decreased functional mobility and endurance 03/17/2015  . Dementia [facility]), possible Alzhemier's type 10/26/2012   Montreal Cognitive Assessment (25 out of 30, points off in visuospatial and and short-term memory).  Independent in iADLs and ADLs.    . Diastolic heart failure ([place]) 07/30/2015  . DISC WITH RADICULOPATHY 04/27/2006   Qualifier: Diagnosis of  By: McDiarmid MD, Sherren Mocha    . Dyspnea   . EDEMA-LEGS,DUE TO VENOUS OBSTRUCT. 04/27/2006   Qualifier: Diagnosis of  By: McDiarmid MD, Sherren Mocha    . 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 Normal uric acid serum level at time of attack.    Marland Kitchen HERNIA, HIATAL, NONCONGENITAL 04/27/2006   Qualifier: Diagnosis of  By: McDiarmid MD, Sherren Mocha    . High risk medications (not anticoagulants) long-term use 03/05/2012  . History of Hemorrhoids 04/27/2006   Qualifier: Diagnosis of  By: McDiarmid MD, Sherren Mocha    . History of iron deficiency 01/16/2015  . Hx of colonoscopy with polypectomy 04/27/2010   Dr Cristina Gong found three  tubular adenomas each less than 10 mm size  . Hypertension   . Hypotension 07/09/2015  . Impaired mobility and ADLs 09/01/2017  . Iron deficiency anemia 08/05/2010   Dr Cristina Gong (GI) has evaluated with EGD, colonoscopy, and video capsular endoscopy in 2011 & 2012.  All have been unrevealing as to an origin of  IDA.  OV with Dr Cristina Gong (10/28/10) assessment of blood in stool per hemoccult and GER. Hbg 12.1 g/dL, MCV 91.8, Ferritin 30 ng/mL. Patient taking on ferrous sulfate tab daily.   EGD on 03/06/12 by Dr Cristina Gong for IDA non-obstructing Schatzki's ring at Gastroesophageal junction, otherwise normal esophagus and stomach.     . Leg cramps 05/29/2012  . Lumbar herniated disc    History of HNP L4/5 in 2003  . Macular degeneration, bilateral 10/04/2010   Right eye is wet MD, the other is dry macular degeneration (ARMD). Pt undergoing some form of vascular endothelial growth factor inhibition intraocular therapy.    . Mild cognitive impairment 10/26/2012   (10/25/12) Failed MiniCog screen  . MUSCLE CRAMPS 03/11/2010   Qualifier: Diagnosis of  By: McDiarmid MD, Sherren Mocha    . Muscle spasm of back 09/24/2013  . Myocardial infarct,  old   . Nocturia 10/28/2016  . Numbness and tingling in hands 07/21/2011  . Pacemaker    MDT  . PREDIABETES 09/11/2007   Qualifier: Diagnosis of  By: McDiarmid MD, Sherren Mocha    . Retinal hemorrhage of left eye 06/2010  . RHINITIS, ALLERGIC 04/27/2006   Qualifier: Diagnosis of  By: McDiarmid MD, Sherren Mocha    . SCHATZKI'S RING, HX OF 11/26/2007   Qualifier: Diagnosis of  By: McDiarmid MD, Sherren Mocha  An EGD was performed by Dr Cristina Gong on 04/27/2010 for iron deficiency anemia. There was a a transient hiatal hernia with Schatzki's ring. Stomach and duodenum were normal. EGD on 03/06/12 by Dr Cristina Gong for IDA non-obstructing Schatzki's ring at Gastroesophageal junction, otherwise normal esophagus and stomach.    . Seborrheic keratosis, right anterior thigh 12/12/2013  . Shoulder pain, left 01/09/2014  . Sick sinus syndrome with tachycardia ([place])    MDT  . Soft tissue injury of foot 05/03/2011  . Solar lentigo 06/15/2012  . Solitary kidney, acquired 05/17/2010   Surgical removal for transitional cell cancer by Tresa Endo, MD ([place]). Surveillance cystoscopy by Dr Alinda Money [facility]) on 10/19/12 without evidence of  cystoscopic recurrence. Recommend RTC one year for cystoscopy.   Marland Kitchen Spinal stenosis, lumbar   . Transitional cell carcinoma of ureter, history   . Urge incontinence 12/13/2011   Diagnosed in 10/2011 by Dr Bjorn Loser (Urology)   . VENTRICULAR HYPERTROPHY, LEFT 08/28/2008   Qualifier: Diagnosis of  By: McDiarmid MD, Sherren Mocha    . VITAMIN B12 DEFICIENCY 10/07/2009   Qualifier: Diagnosis of  By: McDiarmid MD, Sherren Mocha  Dx based on a post-TKR anemia work-up Low normal serum B12 with high Methylmalonic acid and homocysteine level  Vit B12 serum level (10/28/10) > 1500 pg/mL   . Vitamin D deficiency 11/02/2010   Serum vitamin D 25(OH) = 10.9 ng/mL (30 -100) on 10/28/10 c/w Vitamin D deficiency.       ROS:   All systems reviewed and negative except as noted in the HPI.   Past Surgical History:  Procedure Laterality Date  . BREAST SURGERY     breast reduction  . CARDIOVERSION N/A 10/20/2017   Procedure: CARDIOVERSION;  Surgeon: Josue Hector, MD;  Location: [facility] ENDOSCOPY;  Service: Cardiovascular;  Laterality: N/A;  . CHOLECYSTECTOMY    . CORONARY ANGIOPLASTY WITH STENT PLACEMENT  2010   BMS RCA, OM2 occluded  . CYSTOSCOPY  09/30/2015   No cystoscopic evidence of uroepithelial neoplasm.  Follow up surveillance cystoscopy in one year  . CYSTOSCOPY  10/13/2017   Dr Raynelle Bring [facility])  . DUPUYTREN CONTRACTURE RELEASE Right 05/22/2014   Procedure: DUPUYTREN RELEASE AND REPAIR AS NECESSARY RIGHT RING FINGER AND MIDDLE FINGER;  Surgeon: Roseanne Kaufman, MD;  Location: [place];  Service: Orthopedics;  Laterality: Right;  . ESOPHAGOGASTRODUODENOSCOPY  04/27/2010   Dr Cristina Gong - found transient H/H & Schatzki's ring  . ESOPHAGOGASTRODUODENOSCOPY ENDOSCOPY  03/06/2012   Dr Cristina Gong - found non-obstuctive Schatzki's ring at [employer]. o/w normal EGD.   Marland Kitchen EYE SURGERY     bilateral cataracts  . KNEE ARTHROSCOPY W/ SYNOVECTOMY  11/2009, left knee   Dr Wynelle Link  . NEPHRECTOMY  For transition cell cancer     Dr Tresa Endo, surgeon  . PACEMAKER GENERATOR CHANGE N/A 11/12/2012   Procedure: PACEMAKER GENERATOR CHANGE;  Surgeon: Evans Lance, MD; Medtronic [facility] dual-chamber pacemaker serial number [id number]; Laterality: Right  . PACEMAKER INSERTION  2005   Dr Doreatha Lew  . PACEMAKER LEAD  REMOVAL  2005   Removal and reinsertion of atrial and ventricular leads due to migration  . REPLACEMENT TOTAL KNEE  2009, Right knee   Dr Wynelle Link  . REPLACEMENT TOTAL KNEE  05/2009, Left knee   Dr Wynelle Link  . TONSILLECTOMY    . TRIGGER FINGER RELEASE Right 05/22/2014   Procedure: RIGHT HAND A-1 PULLEY RELEASE ;  Surgeon: Roseanne Kaufman, MD;  Location: [place];  Service: Orthopedics;  Laterality: Right;     Family History  Problem Relation Age of Onset  . Dementia Mother   . Heart disease Father   . Heart attack Father      Social History   Socioeconomic History  . Marital status: Widowed    Spouse name: Not on file  . Number of children: 4  . Years of education: Not on file  . Highest education level: Not on file  Occupational History  . 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 Financial resource strain: Not on file  . Food insecurity:    Worry: Not on file    Inability: Not on file  . Transportation needs:    Medical: Not on file    Non-medical: Not on file  Tobacco Use  . Smoking status: Former Smoker    Packs/day: 1.00    Types: Cigarettes    Last attempt to quit: 03/02/2009    Years since quitting: 9.1  . Smokeless tobacco: Never Used  Substance and Sexual Activity  . Alcohol use: Yes    Alcohol/week: 13.0 standard drinks    Types: 7 Glasses of wine, 6 Standard drinks or equivalent per week    Comment: 5 oz white wine per day  . Drug use: No  . Sexual activity: Never  Lifestyle  . Physical activity:    Days per week: Not on file    Minutes per session: Not on file  . Stress: Not on file  Relationships  . Social connections:     Talks on phone: Not on file    Gets together: Not on file    Attends religious service: Not on file    Active member of club or organization: Not on file    Attends meetings of clubs or organizations: Not on file    Relationship status: Not on file  . Intimate partner violence:    Fear of current or ex partner: Not on file    Emotionally abused: Not on file    Physically abused: Not on file    Forced sexual activity: Not on file  Other Topics Concern  . 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    Semi-retired Futures trader.     Pt has home nebulizer for inhalation therapies.   Former Smoker   Smoking Status:  quit > 5 years ago      (+) DNR status per discussion with Dr McDiarmid 10/25/12 and reiterated 06/20/13 office visit .   (+) Living Will/Advance Directive   (+) HC-POA: Cecile Sheerer Causey (pt's dgt)      Current Social History 09/22/2016        Patient lives with daughter, [name] (flight attendant), in two level home 09/22/2016   Transportation: Patient has own vehicle 09/22/2016  Important Relationships 4 daughters and 74 yo grandson 09/22/2016    Pets: None 09/22/2016   Education / Work:  College/ Retired Futures trader 09/22/2016   Interests / Fun: Reads 2 books per week, watches TV, entertains 09/22/2016   Current Stressors: 85 yo house in need of outside repairs 09/22/2016   Religious / Personal Beliefs: Joanie [place]ding[place] 09/22/2016   Other: Had a wonderful marriage/ Very thankful/ wants to continue traveling/ closest friends are all gone 09/22/2016   L. [place]atte, RN, BSN                                                                                                    BP 114/70   Pulse 92   Ht 5\' 5"  (1.651 m)   Wt 160 lb (72.6 kg)   SpO2 98%   BMI 26.63 kg/m   Physical Exam:  Well  appearing elderly woman, NAD HEENT: Unremarkable Neck:  No JVD, no thyromegally Lymphatics:  No adenopathy Back:  No CVA tenderness Lungs:  Clear with no wheezes HEART:  Regular rate rhythm, no murmurs, no rubs, no clicks Abd:  soft, positive bowel sounds, no organomegally, no rebound, no guarding Ext:  2 plus pulses, no edema, no cyanosis, no clubbing Skin:  No rashes no nodules Neuro:  CN II through XII intact, motor grossly intact  EKG - atrial fib with ventricular pacing  DEVICE  Normal device function.  See PaceArt for details.   Assess/Plan: 1. CHB - she is asymptomatic, s/p PPM insertion. 2. PPM - her medtronic DDD PM is working normally.  3. Atrial fib - she has reverted to atrial fib after amiodarone was stopped several months ago. She is not symptomatic in atrial fib. 4. Chronic diastolic heart failure - we spent much of her time today discussing avoiding salty foods. She has been eating a lot of sodium.   Mikle Bosworth.D.

## 2018-05-15 ENCOUNTER — Ambulatory Visit: Payer: Medicare Other | Admitting: Emergency Medicine

## 2018-05-15 LAB — CUP PACEART INCLINIC DEVICE CHECK
Battery Impedance: 636 Ohm
Battery Remaining Longevity: 79 mo
Battery Voltage: 2.79 V
Brady Statistic AP VP Percent: 6 %
Brady Statistic AP VS Percent: 2 %
Brady Statistic AS VP Percent: 1 %
Brady Statistic AS VS Percent: 91 %
Date Time Interrogation Session: 20200312142847
Implantable Lead Implant Date: 20050523
Implantable Lead Location: 753859
Implantable Lead Location: 753860
Implantable Lead Model: 5076
Implantable Lead Model: 5076
Implantable Pulse Generator Implant Date: 20140915
Lead Channel Impedance Value: 509 Ohm
Lead Channel Impedance Value: 854 Ohm
Lead Channel Pacing Threshold Amplitude: 0.5 V
Lead Channel Pacing Threshold Amplitude: 0.75 V
Lead Channel Pacing Threshold Amplitude: 1 V
Lead Channel Pacing Threshold Pulse Width: 0.4 ms
Lead Channel Pacing Threshold Pulse Width: 0.4 ms
Lead Channel Sensing Intrinsic Amplitude: 0.25 mV
Lead Channel Sensing Intrinsic Amplitude: 22.4 mV
Lead Channel Setting Pacing Amplitude: 2 V
Lead Channel Setting Pacing Amplitude: 2 V
Lead Channel Setting Pacing Pulse Width: 0.4 ms
Lead Channel Setting Sensing Sensitivity: 5.6 mV
MDC IDC LEAD IMPLANT DT: 20050523
MDC IDC MSMT LEADCHNL RV PACING THRESHOLD PULSEWIDTH: 0.4 ms

## 2018-05-16 ENCOUNTER — Other Ambulatory Visit: Payer: Self-pay | Admitting: Family Medicine

## 2018-05-20 ENCOUNTER — Other Ambulatory Visit: Payer: Self-pay | Admitting: Cardiology

## 2018-05-21 DIAGNOSIS — H353211 Exudative age-related macular degeneration, right eye, with active choroidal neovascularization: Secondary | ICD-10-CM | POA: Diagnosis not present

## 2018-05-29 ENCOUNTER — Encounter: Payer: Medicare Other | Admitting: Internal Medicine

## 2018-06-01 ENCOUNTER — Telehealth: Payer: Self-pay | Admitting: Emergency Medicine

## 2018-06-01 NOTE — Telephone Encounter (Signed)
lmom 

## 2018-06-04 NOTE — Telephone Encounter (Signed)
Patient wanted to know if we had samples I advised we didn't and wouldn't for a while.

## 2018-06-06 ENCOUNTER — Other Ambulatory Visit: Payer: Self-pay | Admitting: Emergency Medicine

## 2018-06-06 DIAGNOSIS — J449 Chronic obstructive pulmonary disease, unspecified: Secondary | ICD-10-CM

## 2018-06-07 ENCOUNTER — Telehealth: Payer: Self-pay | Admitting: Emergency Medicine

## 2018-06-07 MED ORDER — FLUTICASONE-UMECLIDIN-VILANT 100-62.5-25 MCG/INH IN AEPB: 1.0000 | INHALATION_SPRAY | Freq: Every day | RESPIRATORY_TRACT | 1 refills | Status: DC

## 2018-06-07 NOTE — Telephone Encounter (Signed)
Left message informing patient refill has been sent in. Nothing further is needed at this time.

## 2018-06-26 ENCOUNTER — Ambulatory Visit: Payer: Medicare Other | Admitting: Emergency Medicine

## 2018-06-27 DIAGNOSIS — H353231 Exudative age-related macular degeneration, bilateral, with active choroidal neovascularization: Secondary | ICD-10-CM | POA: Diagnosis not present

## 2018-07-03 ENCOUNTER — Telehealth: Payer: Self-pay | Admitting: Family Medicine

## 2018-07-03 ENCOUNTER — Other Ambulatory Visit: Payer: Self-pay | Admitting: Emergency Medicine

## 2018-07-03 DIAGNOSIS — M4316 Spondylolisthesis, lumbar region: Secondary | ICD-10-CM

## 2018-07-03 DIAGNOSIS — IMO0002 Reserved for concepts with insufficient information to code with codable children: Secondary | ICD-10-CM

## 2018-07-03 DIAGNOSIS — M48061 Spinal stenosis, lumbar region without neurogenic claudication: Secondary | ICD-10-CM

## 2018-07-03 DIAGNOSIS — M1710 Unilateral primary osteoarthritis, unspecified knee: Secondary | ICD-10-CM

## 2018-07-03 NOTE — Telephone Encounter (Signed)
Will forward to MD to advise as hydrocodone is not on her current medication list.  [name],CMA

## 2018-07-03 NOTE — Telephone Encounter (Signed)
Pt is calling and would like a refill on her Hydrocodone. She doesn't take this very often and the prescription that was at the pharmacy has expired. She is also scheduled to come in on 07/12/18. jw

## 2018-07-04 ENCOUNTER — Telehealth: Payer: Self-pay | Admitting: *Deleted

## 2018-07-04 MED ORDER — HYDROCODONE-ACETAMINOPHEN 5-325 MG PO TABS: 0.5000 | ORAL_TABLET | Freq: Four times a day (QID) | ORAL | 0 refills | Status: DC | PRN

## 2018-07-04 NOTE — Telephone Encounter (Signed)
Johnathan calls because pt called in refill of tramadol last night and then hydrocodone was called in today.  Wants to verify that pt is to be on both.  Will forward to MD. Christen Bame, CMA

## 2018-07-09 NOTE — Telephone Encounter (Signed)
The hydrocodone should be for pain unrelieved by tramadol.

## 2018-07-10 ENCOUNTER — Other Ambulatory Visit: Payer: Self-pay | Admitting: Interventional Cardiology

## 2018-07-12 ENCOUNTER — Encounter: Payer: Medicare Other | Admitting: *Deleted

## 2018-07-12 ENCOUNTER — Other Ambulatory Visit: Payer: Self-pay

## 2018-07-12 ENCOUNTER — Encounter: Payer: Self-pay | Admitting: Family Medicine

## 2018-07-12 ENCOUNTER — Ambulatory Visit (INDEPENDENT_AMBULATORY_CARE_PROVIDER_SITE_OTHER): Payer: Medicare Other | Admitting: Family Medicine

## 2018-07-12 VITALS — BP 130/64 | HR 92 | Wt 171.5 lb

## 2018-07-12 DIAGNOSIS — F0280 Dementia in other diseases classified elsewhere without behavioral disturbance: Secondary | ICD-10-CM

## 2018-07-12 DIAGNOSIS — E538 Deficiency of other specified B group vitamins: Secondary | ICD-10-CM

## 2018-07-12 DIAGNOSIS — I5032 Chronic diastolic (congestive) heart failure: Secondary | ICD-10-CM

## 2018-07-12 DIAGNOSIS — G309 Alzheimer's disease, unspecified: Secondary | ICD-10-CM | POA: Diagnosis not present

## 2018-07-12 DIAGNOSIS — J449 Chronic obstructive pulmonary disease, unspecified: Secondary | ICD-10-CM | POA: Diagnosis not present

## 2018-07-12 DIAGNOSIS — N184 Chronic kidney disease, stage 4 (severe): Secondary | ICD-10-CM

## 2018-07-12 DIAGNOSIS — M545 Low back pain, unspecified: Secondary | ICD-10-CM

## 2018-07-12 DIAGNOSIS — I4821 Permanent atrial fibrillation: Secondary | ICD-10-CM | POA: Diagnosis not present

## 2018-07-12 NOTE — Patient Instructions (Addendum)
Your heart and lungs sound good.  Remember to pick up Eliquis, Ceftin, Doxycycline, omeprazole, Vitamin B12 1000 mcg tablet   Telephone visits are now available.   Go for the X-ray of your back to make sure you did not crack a vertebrae.   We are checking your kidneys with a blood test today.

## 2018-07-12 NOTE — Progress Notes (Signed)
[name] is alone Sources of clinical information for visit is/are patient and past medical records. Nursing assessment for this office visit was reviewed with the patient for accuracy and revision.   Previous Report(s) Reviewed: historical medical records  Depression screen [facility] 2/9 02/01/2018  Decreased Interest 0  Down, Depressed, Hopeless 0  PHQ - 2 Score 0  Altered sleeping -  Tired, decreased energy -  Change in appetite -  Feeling bad or failure about yourself  -  Trouble concentrating -  Moving slowly or fidgety/restless -  Suicidal thoughts -  PHQ-9 Score -  Difficult doing work/chores -  Some recent data might be hidden   Fall Risk  02/01/2018 12/25/2017 12/06/2017 11/02/2017 10/06/2017  Falls in the past year? 1 No Yes Yes No  Comment - - - - -  Number falls in past yr: 1 - 2 or more 2 or more -  Injury with Fall? 1 - Yes Yes -  Comment - - - - -  Risk Factor Category  - - High Fall Risk High Fall Risk -  Risk for fall due to : - - History of fall(s) History of fall(s) -  Follow up - - - - -    History/P.E. limitations: dementia  There are no preventive care reminders to display for this patient.  Diabetes Health Maintenance Due  Topic Date Due  . LIPID PANEL  08/11/2017    Health Maintenance Due  Topic Date Due  . LIPID PANEL  08/11/2017     Chief Complaint  Patient presents with  . Shortness of Breath      Chief Complaint: COPD  HPI:  COPD Disease Monitoring Cough: none Phlegm: none Chest tightness: no Breathlessness with walking:if rushing, if takes her time, then no SHOB Limitation in activity: Active; climbing stairs and walking quickly Confidence in leaving home: confident.   Sleep disturbance from breathing problem: generally restful sleep Level of energy: good  Pulmonary Medications Medications: Trelegy, prn albuterol Adherence:  no know adherence challenges, other than cost of Trelegy though she does purchase it.   BACK PAIN Acute or  gradual onset: Uncertain onset, but within last 1-2 months Events around onset: none recalled by the patient; no known injury Location: across the lower back Quality: intermittent sharp pain that doubles over;   Worse with: walking; no position or movement precipitates; No weakness, numbness, incontinence, abdominal pain, fever, weight loss, leg pain, leg weakness  Better with: being still Radiation: without radiation Morning stiffness: no Trauma/repetitive stress: no Better sitting or leaning forward: no Bladder symptoms: no  PMH of back pain: no prior back problems Imaging Hx: none of lumbar spine Back pain prior therapy Hx: none  Red Flags  Fecal/urinary incontinence: no  High energy trauma: no  Numbness/Weakness: no  Fever/chills/sweats: no  Night pain: no  Unexplained weight loss: no  No relief with bedrest: no  Meds: has not filled hydrocodone/apap rx from 07/04/18 request h/o cancer/immunosuppression: yes, distant transitional cell bladder cancer  IV drug use: no  PMH of osteoporosis or chronic steroid use: yes, chronic pulse doses Prednisone for AE-COPD events for many years  Pertinent PMHx: COPD Med Review: Med list updated.  Patient needs to refillEliquis, Ceftin/Doxy(cyclic pulse doses by Dr Lamonte Sakai (pulm)), omeprazole, Vitamin B12   Leg Edema - longstanding - intermittent flares bilateral swelling at ankles - taking lasx about once a week.  - not wearing support hose bc too hard to get on.    Dementia -  Acknowledges her meory seems t be getting worse, can get distracted during conversations - Dgts helping with all iADLs.  No longer driving.  Driver's License has expired.  She wants to take the drivers test to get her license back.  - Denies hallucinations or suspiciousness .  ROS:  No falls No open skin wounds   Exam:  VS reviewed GEN: Alert, Cooperative, Groomed, NAD COR: irr irr, rate controlled, No M/G/R LUNGS: BCTA, No Acc mm use, speaking in full  sentences ABDOMEN: (+)BS, soft, NT, ND, No HSM, No palpable masses EXT: No peripheral leg edema. Feet without deformity or lesions. Palpable bilateral pedal pulses.  BACK  Inspection: no deformities Motion: able to flex at waist to 90 degrees withut difficulty SLR seated: neg                         SLR lying: neg Palpable tenderness: lower lumbar spine midline at level at and below iliac crests  Gait: Non-atalgic, normal speed, No significant path deviation, Step through +  Psych: Normal affect/thought/speech/language   Assessment/Plan:  Low back pain New problem Subacute presentation No high-risk symptoms Pt is at risk of osteoporotic vertebral fractures given years of prednisone pulse doses and hx of Vitamin D insufficiency, and history of smoking.   Plan Lumbar spine Xrays r/o fractures. Pt has filled tramadol in her med bag She has not fillwed the Norco 5 sent in 07/04/18.  I let her know that should she choose to use it for back pain to start with half tablet dose.    Atrial fibrillation ([place]), permanent Established problem Controlled Continue current therapy regiment.   Chronic diastolic CHF (congestive heart failure) ([place]) Established problem Controlled Continue current therapy regiment.   Chronic kidney disease (CKD), stage IV (severe) (H[facility]) Lab Results  Component Value Date   CREATININE 1.55 (H) 07/12/2018   BUN 30 07/12/2018   NA 140 07/12/2018   K 4.4 07/12/2018   CL 102 07/12/2018   CO2 22 07/12/2018   Stable to improved SCr. Avoid nephrotoxics    COPD, severe ([place]) Established problem Controlled Continue current therapy regiment.   Dementia ([place]), possible Alzhemier's type Established problem Further decline iADLs with patient no longer driving, though she arranges her transportation thru her dgts. Mrs Finstad is performing all her own ADLs Pt acknowledges worsening of memory.

## 2018-07-13 ENCOUNTER — Encounter: Payer: Self-pay | Admitting: Family Medicine

## 2018-07-13 ENCOUNTER — Telehealth: Payer: Self-pay

## 2018-07-13 LAB — BASIC METABOLIC PANEL
BUN/Creatinine Ratio: 19 (ref 12–28)
BUN: 30 mg/dL (ref 10–36)
CO2: 22 mmol/L (ref 20–29)
Calcium: 8.8 mg/dL (ref 8.7–10.3)
Chloride: 102 mmol/L (ref 96–106)
Creatinine, Ser: 1.55 mg/dL — ABNORMAL HIGH (ref 0.57–1.00)
GFR calc Af Amer: 33 mL/min/{1.73_m2} — ABNORMAL LOW (ref >59)
GFR calc non Af Amer: 29 mL/min/{1.73_m2} — ABNORMAL LOW (ref >59)
Glucose: 104 mg/dL — ABNORMAL HIGH (ref 65–99)
Potassium: 4.4 mmol/L (ref 3.5–5.2)
Sodium: 140 mmol/L (ref 134–144)

## 2018-07-13 MED ORDER — VITAMIN B-12 1000 MCG PO TABS: 1000.0000 ug | ORAL_TABLET | Freq: Every day | ORAL | 3 refills | Status: DC

## 2018-07-13 NOTE — Telephone Encounter (Signed)
Left message for patient to remind of missed remote transmission.  

## 2018-07-13 NOTE — Assessment & Plan Note (Signed)
Established problem Controlled Continue current therapy regiment.  

## 2018-07-13 NOTE — Assessment & Plan Note (Signed)
Established problem Further decline iADLs with patient no longer driving, though she arranges her transportation thru her dgts. [name] is performing all her own ADLs Pt acknowledges worsening of memory.

## 2018-07-13 NOTE — Assessment & Plan Note (Signed)
New problem Subacute presentation No high-risk symptoms Pt is at risk of osteoporotic vertebral fractures given years of prednisone pulse doses and hx of Vitamin D insufficiency, and history of smoking.   Plan Lumbar spine Xrays r/o fractures. Pt has filled tramadol in her med bag She has not fillwed the Norco 5 sent in 07/04/18.  I let her know that should she choose to use it for back pain to start with half tablet dose.

## 2018-07-13 NOTE — Assessment & Plan Note (Signed)
Lab Results  Component Value Date   CREATININE 1.55 (H) 07/12/2018   BUN 30 07/12/2018   NA 140 07/12/2018   K 4.4 07/12/2018   CL 102 07/12/2018   CO2 22 07/12/2018   Stable to improved SCr. Avoid nephrotoxics

## 2018-07-19 ENCOUNTER — Encounter: Payer: Self-pay | Admitting: Cardiology

## 2018-08-02 ENCOUNTER — Telehealth: Payer: Self-pay

## 2018-08-02 DIAGNOSIS — H353211 Exudative age-related macular degeneration, right eye, with active choroidal neovascularization: Secondary | ICD-10-CM | POA: Diagnosis not present

## 2018-08-02 NOTE — Telephone Encounter (Signed)
Attempted to contact x2. No voicemail kicked in, phone just rings. Will leave message in triage and address in AM as message came in after 5.

## 2018-08-03 NOTE — Telephone Encounter (Signed)
Spoke with the pt  She is requesting rx for chair lift  Dr Lamonte Sakai are you okay with this  Pt aware you are out of the office at this time and ok with waiting  If okay, fax number in original msg, thanks

## 2018-08-03 NOTE — Telephone Encounter (Signed)
Pt returning call and would like a call back.

## 2018-08-03 NOTE — Telephone Encounter (Signed)
LMTCB

## 2018-08-06 ENCOUNTER — Telehealth: Payer: Self-pay | Admitting: Interventional Cardiology

## 2018-08-06 DIAGNOSIS — M7989 Other specified soft tissue disorders: Secondary | ICD-10-CM

## 2018-08-06 NOTE — Telephone Encounter (Signed)
Elevation. On Apixaban and therefore not likely to be , but still needs LE venous doppler.

## 2018-08-06 NOTE — Telephone Encounter (Signed)
Spoke with pt and went over recommendations.  Pt verbalized understanding and was in agreement with plan.  

## 2018-08-06 NOTE — Telephone Encounter (Signed)
Pt calling today with c/o right ankle/foot swelling with blisters. She states her left ankle is slightly swollen, but her right ankle is substantially swollen. She denies arrhythmia or pain at the ankle.  She has been taking 40mg  of lasix for the last 5 days with no relief. She has not gained any weight.   She spoke with her PCP and he suggested she call Dr Thompson Caul office for recommendation. I advised her I would forward to him and his RN for review. We will contact her back tomorrow.  She verbalized understanding and had no additional questions.

## 2018-08-06 NOTE — Telephone Encounter (Signed)
New Message      Pt c/o swelling: STAT is pt has developed SOB within 24 hours  1) How much weight have you gained and in what time span? Has not gained weight   2) If swelling, where is the swelling located? Swelling on legs and feet, she says she has blisters on her right foot that are filled with water   3) Are you currently taking a fluid pill? Yes   4) Are you currently SOB? No   5) Do you have a log of your daily weights (if so, list)? No, she says she weighs the same all the time   6) Have you gained 3 pounds in a day or 5 pounds in a week? Says she weights the same   7) Have you traveled recently? No

## 2018-08-07 NOTE — Telephone Encounter (Signed)
RB please advise. Thanks.  

## 2018-08-08 ENCOUNTER — Ambulatory Visit (HOSPITAL_COMMUNITY)
Admission: RE | Admit: 2018-08-08 | Discharge: 2018-08-08 | Disposition: A | Discharge status: Home / Self Care | Payer: Medicare Other | Source: Ambulatory Visit | Attending: Cardiology | Admitting: Cardiology

## 2018-08-08 ENCOUNTER — Other Ambulatory Visit: Payer: Self-pay

## 2018-08-08 ENCOUNTER — Encounter (HOSPITAL_COMMUNITY): Payer: Self-pay

## 2018-08-08 DIAGNOSIS — M7989 Other specified soft tissue disorders: Secondary | ICD-10-CM | POA: Insufficient documentation

## 2018-08-08 NOTE — Progress Notes (Signed)
Bilateral lower venous has been completed and is negative for DVT. Preliminary results can be found under CV proc through chart review.  Wilkie Aye RVT [facility]

## 2018-08-09 NOTE — Telephone Encounter (Signed)
I took care of this.

## 2018-08-10 ENCOUNTER — Telehealth: Payer: Self-pay | Admitting: Family Medicine

## 2018-08-10 DIAGNOSIS — I890 Lymphedema, not elsewhere classified: Secondary | ICD-10-CM

## 2018-08-10 NOTE — Telephone Encounter (Signed)
I spoke with [name] about the swelling of her ankles. (+) Ankle blisters by report.  She took two Lasix tablets on the directions of cardiology without significant improvement.  She is keeping her legs elevated.   A/ Persistent bilateral ankle edema with complications  P/ Referral for Lymphedema Therapy at [facility] office. Hopefully [name] will be able to get an appointment despite the CoViD restrictions.  Continue Elevation of legs Patient unable to don graduated compression hose.

## 2018-08-28 ENCOUNTER — Ambulatory Visit (INDEPENDENT_AMBULATORY_CARE_PROVIDER_SITE_OTHER): Payer: Medicare Other | Admitting: Family Medicine

## 2018-08-28 ENCOUNTER — Other Ambulatory Visit: Payer: Self-pay

## 2018-08-28 VITALS — BP 136/74 | HR 60 | Wt 175.2 lb

## 2018-08-28 DIAGNOSIS — R0609 Other forms of dyspnea: Secondary | ICD-10-CM | POA: Diagnosis not present

## 2018-08-28 DIAGNOSIS — I499 Cardiac arrhythmia, unspecified: Secondary | ICD-10-CM

## 2018-08-28 DIAGNOSIS — R600 Localized edema: Secondary | ICD-10-CM | POA: Diagnosis not present

## 2018-08-28 NOTE — Progress Notes (Signed)
Subjective:    Patient ID: [name], female    DOB: [birth year], 85 y.o.   MRN: [id number] KYEISHA JANOWICZ is alone Sources of clinical information for visit is/are patient and past medical records. Nursing assessment for this office visit was reviewed with the patient for accuracy and revision.   Previous Report(s) Reviewed: historical medical records  Depression screen [facility] 2/9 08/28/2018  Decreased Interest 0  Down, Depressed, Hopeless 0  PHQ - 2 Score 0  Altered sleeping -  Tired, decreased energy -  Change in appetite -  Feeling bad or failure about yourself  -  Trouble concentrating -  Moving slowly or fidgety/restless -  Suicidal thoughts -  PHQ-9 Score -  Difficult doing work/chores -  Some recent data might be hidden   Fall Risk  08/28/2018 02/01/2018 12/25/2017 12/06/2017 11/02/2017  Falls in the past year? 0 1 No Yes Yes  Comment - - - - -  Number falls in past yr: 0 1 - 2 or more 2 or more  Injury with Fall? - 1 - Yes Yes  Comment - - - - -  Risk Factor Category  - - - High Fall Risk High Fall Risk  Risk for fall due to : - - - History of fall(s) History of fall(s)  Follow up Falls evaluation completed - - - -    History/P.E. limitations: none  There are no preventive care reminders to display for this patient.  Diabetes Health Maintenance Due  Topic Date Due  . LIPID PANEL  08/11/2017    Health Maintenance Due  Topic Date Due  . LIPID PANEL  08/11/2017     Chief Complaint  Patient presents with  . Blisters on Legs     HPI  Bilateral leg edema - Ongoing issue since 05/17/2010 ov with bilateral ankle edema; dx venous stasis ulcer Right leg 10/2014, periods of no leg edema on ov exams; 01/29/16 BNP 550; 04/28/2016 ov increasing intermittent, lasix & leg elevation responsive bilateral leg edema; intermittent leg edema persists  into ov in 2019; 10/2017 venous ulcer left leg; leg edema becoming more persistent over 2019. SCr elevates higher dose lasix - Seen at  [facility] with bilateral leg edema to knees. Bil venous insufficiency duplex was negative for venous insufficiency. Venous reflux study negative fo significant deep or superficial reflux.  Recommended leg elevation when possible and compression if tolerable. - No peripheral edema 07/12/18 [place] OV with wt 171 lbs.  - Pt called cardiology with complaint of leg edema.  Leg venous doppler ordered.  No evidence DVT.  - Prior diagnostic workups have found no evidence of organ failure as source of edema.  No evidence of DVT on recent venous doppler by cardiology - Patient took lasix 40 mg day PTV with leg elevation with little improvement. She take 20 mg lasix daily. - Patient unable to doff 20-30 mmHg compression hose - Patient complained by phone yesterday of blisters on legs.   SH: no smoking  Review of Systems See hpi No fever No cough No palpitations No chest pains    Objective:   Physical Exam VS reviewed GEN: Alert, Cooperative, Groomed, NAD, wearing ACE bandage wraps lower half of forelegs HEENT: No JVD COR: irr irr, not tachycardic, No M/G/R LUNGS: BCTA, No Acc mm use, speaking in full sentences EXT: Able to climb up on exam table without assistance or evident difficulty.  Obvious compression depressions with removal of ACE wrap. +2 pitting edema up to  three quarter way to knees bil.  No open skin breaks nor erosions.  No vesicle/blisters.  Old scarring skin tissue scatter shins bil.  Gait: Normal speed, No significant path deviation, Step through +, carrying large purse without balance difficulties evident Psych: Normal affect/thought/speech/language    Assessment & Plan:  25 minutes face to face where spent in total with counseling / coordination of care took more than 50% of the total time. Counseling involved discussion the treatment options for her condition.

## 2018-08-28 NOTE — Assessment & Plan Note (Signed)
Established problem Longstanding intermittent bilateral ankle/leg edema back to 2012 with gradual worsening through intermittent stage, now persistent stage. No Venous insufficiency on VVS testing recently. No evidence of volume overload of organ failure, including heart.  Working explanation is chronic leg lymphedema bilaterally.  Treatment is leg elevation when possible, and compression if tolerated.   [name] is tired of having to keep legs elevated much of the day and cannot tolerate graduated compression hose.  Lasix is no longer helping, if it ever did.   Acutely, Placed [name] bilaterally in [employer] with Coban coverings.  Pt should return in one week to [facility] for nurse visit to remove and replace bilateral Unna boots with coban covers.  Dr Gautham Hewins will try to get a local referral for Lymphedema therapy.  Failing local, will make referral to [facility] for lymphedema therapy.

## 2018-08-28 NOTE — Patient Instructions (Signed)
Keep your Unna boots dry.  They will tighten some over the rest of the day as they dry out.  Come back in one week to have both Unna Boots replaced.  Dr Claus Silvestro will work on getting a referral to a [facility] where they can help get and keep the swelling in your legs under control.  We are checking your kidney's, liver, proteins, electrolytes and heart today with blood tests.    If the boots become uncomfortable or your feet or toes start to ache, contact Dr Giavonna Pflum's office and cut off the boots.

## 2018-08-30 LAB — CBC
Hematocrit: 32.4 % — ABNORMAL LOW (ref 34.0–46.6)
Hemoglobin: 10.2 g/dL — ABNORMAL LOW (ref 11.1–15.9)
MCH: 30.1 pg (ref 26.6–33.0)
MCHC: 31.5 g/dL (ref 31.5–35.7)
MCV: 96 fL (ref 79–97)
Platelets: 230 10*3/uL (ref 150–450)
RBC: 3.39 x10E6/uL — ABNORMAL LOW (ref 3.77–5.28)
RDW: 13.6 % (ref 11.7–15.4)
WBC: 5.4 10*3/uL (ref 3.4–10.8)

## 2018-08-30 LAB — CMP14+EGFR
ALT: 28 IU/L (ref 0–32)
AST: 32 IU/L (ref 0–40)
Albumin/Globulin Ratio: 1.8 (ref 1.2–2.2)
Albumin: 3.9 g/dL (ref 3.5–4.6)
Alkaline Phosphatase: 76 IU/L (ref 39–117)
BUN/Creatinine Ratio: 22 (ref 12–28)
BUN: 37 mg/dL — ABNORMAL HIGH (ref 10–36)
Bilirubin Total: 0.7 mg/dL (ref 0.0–1.2)
CO2: 24 mmol/L (ref 20–29)
Calcium: 8.4 mg/dL — ABNORMAL LOW (ref 8.7–10.3)
Chloride: 103 mmol/L (ref 96–106)
Creatinine, Ser: 1.72 mg/dL — ABNORMAL HIGH (ref 0.57–1.00)
GFR calc Af Amer: 29 mL/min/{1.73_m2} — ABNORMAL LOW (ref >59)
GFR calc non Af Amer: 25 mL/min/{1.73_m2} — ABNORMAL LOW (ref >59)
Globulin, Total: 2.2 g/dL (ref 1.5–4.5)
Glucose: 86 mg/dL (ref 65–99)
Potassium: 4.8 mmol/L (ref 3.5–5.2)
Sodium: 141 mmol/L (ref 134–144)
Total Protein: 6.1 g/dL (ref 6.0–8.5)

## 2018-08-30 LAB — BRAIN NATRIURETIC PEPTIDE: BNP: 497.3 pg/mL — ABNORMAL HIGH (ref 0.0–100.0)

## 2018-08-30 LAB — TSH: TSH: 4.04 u[IU]/mL (ref 0.450–4.500)

## 2018-09-04 ENCOUNTER — Other Ambulatory Visit: Payer: Self-pay

## 2018-09-04 ENCOUNTER — Ambulatory Visit (INDEPENDENT_AMBULATORY_CARE_PROVIDER_SITE_OTHER): Payer: Medicare Other | Admitting: *Deleted

## 2018-09-04 DIAGNOSIS — R600 Localized edema: Secondary | ICD-10-CM

## 2018-09-04 NOTE — Progress Notes (Signed)
Pt is here for unna boot removal and replace.  Bilateral unna boots removed.  Legs are significantly smaller and no blisters noted.  There is one small skin tear on right lower leg that happened when removing unna boot.  Pt states " I have a wedding this weekend and will not go if I have these boots on.  My legs look really good can we just wait?"  Attempted to find MD, but he was inpatient.  Spoke with Dr. Owens Shark, preceptor, since legs are less swollen and no blisters are seen will hold off on replacement of unna boots.  Triple antibiotic ointment applied to skin tear.  Appt made for 09/13/18 with Dr. McDiarmid for reassessment.   Pt aware to come back if she develops blisters or swelling.  [name], CMA

## 2018-09-06 DIAGNOSIS — H35033 Hypertensive retinopathy, bilateral: Secondary | ICD-10-CM | POA: Diagnosis not present

## 2018-09-06 DIAGNOSIS — H353231 Exudative age-related macular degeneration, bilateral, with active choroidal neovascularization: Secondary | ICD-10-CM | POA: Diagnosis not present

## 2018-09-06 DIAGNOSIS — H43813 Vitreous degeneration, bilateral: Secondary | ICD-10-CM | POA: Diagnosis not present

## 2018-09-11 ENCOUNTER — Ambulatory Visit (INDEPENDENT_AMBULATORY_CARE_PROVIDER_SITE_OTHER): Payer: Medicare Other | Admitting: Emergency Medicine

## 2018-09-11 ENCOUNTER — Other Ambulatory Visit: Payer: Self-pay

## 2018-09-11 ENCOUNTER — Encounter: Payer: Self-pay | Admitting: Emergency Medicine

## 2018-09-11 DIAGNOSIS — J449 Chronic obstructive pulmonary disease, unspecified: Secondary | ICD-10-CM | POA: Diagnosis not present

## 2018-09-11 MED ORDER — TRELEGY ELLIPTA 100-62.5-25 MCG/INH IN AEPB: 1.0000 | INHALATION_SPRAY | Freq: Every day | RESPIRATORY_TRACT | 0 refills | Status: DC

## 2018-09-11 MED ORDER — ALBUTEROL SULFATE HFA 108 (90 BASE) MCG/ACT IN AERS: 2.0000 | INHALATION_SPRAY | RESPIRATORY_TRACT | 5 refills | Status: DC | PRN

## 2018-09-11 NOTE — Patient Instructions (Signed)
Please continue Trelegy once daily.  Remember to rinse and gargle after use this medicine. Continue to use your DuoNeb as needed for increased shortness of breath, chest tightness, wheezing. We will give you an albuterol inhaler that she can use 2 puffs up to every 4 hours if needed for shortness of breath, chest tightness, wheezing. You will need the flu shot in the fall Follow with Dr Lamonte Sakai in 3 months or sooner if you have any problems.

## 2018-09-11 NOTE — Progress Notes (Signed)
GERD  Subjective:    Patient ID: [name], female    DOB: 06-Dec-1923, 85 y.o.   MRN: [id number]  COPD She complains of shortness of breath. There is no cough or wheezing. Pertinent negatives include no chest pain, ear pain, fever, headaches, postnasal drip, rhinorrhea, sneezing, sore throat or trouble swallowing. Her past medical history is significant for COPD.    ROV 02/13/18 --[name] is 54 with a history of severe COPD, frequent bronchitic symptoms and chronic cough.  She also has a history of A. fib, hypertension with diastolic CHF, chronic kidney disease and a hiatal hernia with GERD.  We have been managing her on Trelegy and DuoNeb. She has never been qualified for o2. She describes some worsening in her overall exertional tolerance since last time. She has albuterol but does not use frequently. We had planned to use DuoNeb TID. She is coughing minimally.   ROV 03/20/18 --follow-up visit for [name], 85 year old woman with a history of severe COPD with a bronchitic phenotype, frequent bronchitic symptoms, chronic cough.  She is also followed and treated for atrial fibrillation, chronic kidney disease, hypertension plus diastolic CHF, hiatal hernia with GERD. She is having a lot of exertional dyspnea, also more sx when it is cold out. She is using Trelegy, has DuoNeb available - uses 2-3x a day. Minimal cough, she does hear wheeze frequently. She fell yesterday and hurt her R hand, skin tear. It still hurts her.   ROV 09/11/2018 --and is 47 with a history of severe COPD, chronic bronchitis with frequent bronchitic symptoms.  Chronic cough.  She also has a history of GERD with hiatal hernia, hypertension, atrial fibrillation, CKD with diastolic dysfunction. She is on diuretics, uses compression hose.   We have been managing her on Trelegy and DuoNeb's as needed - using less frequently lately. She has been having a bit more exertional SOB, seems to coincide with her LE edema levels. She has  trouble in the humid air.  She is not on oxygen - has not qualified.    Review of Systems  Constitutional: Negative for fever and unexpected weight change.  HENT: Positive for congestion. Negative for dental problem, ear pain, nosebleeds, postnasal drip, rhinorrhea, sinus pressure, sneezing, sore throat and trouble swallowing.   Eyes: Negative for redness and itching.  Respiratory: Positive for shortness of breath. Negative for cough, chest tightness and wheezing.   Cardiovascular: Positive for palpitations. Negative for chest pain and leg swelling.  Gastrointestinal: Negative for nausea and vomiting.  Genitourinary: Negative for dysuria.  Musculoskeletal: Negative for joint swelling.  Skin: Negative for rash.  Neurological: Negative for headaches.  Hematological: Does not bruise/bleed easily.  Psychiatric/Behavioral: Negative for dysphoric mood. The patient is nervous/anxious.         Objective:   Physical Exam  Vitals:   09/11/18 1015  BP: 122/74  Pulse: 80  SpO2: 98%  Weight: 177 lb (80.3 kg)  Height: 5\' 5"  (1.651 m)   Gen: Pleasant, elderly woman in no distress,  normal affect  ENT: No lesions,  mouth clear,  oropharynx clear, no postnasal drip, no upper airway noise  Neck: No JVD, no stridor  Lungs: No use of accessory muscles, clear on a normal breath, widespread bilateral wheeze on forced expiration.  Cardiovascular: Irregular, heart sounds normal, no murmur or gallops, no peripheral edema  Musculoskeletal: No deformities  Neuro: alert, non focal  Skin: Warm, no lesions or rash      Assessment & Plan:  COPD,  severe ([name]) Overall stable on the limited.  Consider repeat walking oximetry next time.  Please continue Trelegy once daily.  Remember to rinse and gargle after use this medicine. Continue to use your DuoNeb as needed for increased shortness of breath, chest tightness, wheezing. We will give you an albuterol inhaler that she can use 2 puffs up to  every 4 hours if needed for shortness of breath, chest tightness, wheezing. You will need the flu shot in the fall Follow with Dr Lamonte Sakai in 3 months or sooner if you have any problems.  Baltazar Apo, MD, PhD 09/11/2018, 10:50 AM [facility] [phone] or if no answer [phone]

## 2018-09-11 NOTE — Assessment & Plan Note (Signed)
Overall stable on the limited.  Consider repeat walking oximetry next time.  Please continue Trelegy once daily.  Remember to rinse and gargle after use this medicine. Continue to use your DuoNeb as needed for increased shortness of breath, chest tightness, wheezing. We will give you an albuterol inhaler that she can use 2 puffs up to every 4 hours if needed for shortness of breath, chest tightness, wheezing. You will need the flu shot in the fall Follow with Dr Lamonte Sakai in 3 months or sooner if you have any problems.

## 2018-09-13 ENCOUNTER — Encounter: Payer: Self-pay | Admitting: Family Medicine

## 2018-09-13 ENCOUNTER — Other Ambulatory Visit: Payer: Self-pay

## 2018-09-13 ENCOUNTER — Ambulatory Visit (INDEPENDENT_AMBULATORY_CARE_PROVIDER_SITE_OTHER): Payer: Medicare Other | Admitting: Family Medicine

## 2018-09-13 VITALS — BP 120/68 | HR 97 | Wt 176.0 lb

## 2018-09-13 DIAGNOSIS — R600 Localized edema: Secondary | ICD-10-CM

## 2018-09-13 NOTE — Patient Instructions (Addendum)
Dr Dalyah Pla will make the referral to the [facility]  . See if you can use the wire caddy to help get the compression stockings on.  I like the compression stockings you are wearing.  You may wear them continuously for up to one week.

## 2018-09-14 NOTE — Progress Notes (Signed)
Patient ID: [name], female   DOB: Jul 26, 1923, 85 y.o.   MRN: [id number]    Subjective:    Patient ID: [name], female    DOB: [birth year], 85 y.o.   MRN: [id number] [name] is alone Sources of clinical information for visit is/are patient and past medical records. Nursing assessment for this office visit was reviewed with the patient for accuracy and revision.   Previous Report(s) Reviewed: historical medical records  Depression screen [facility] 2/9 09/13/2018  Decreased Interest 0  Down, Depressed, Hopeless 0  PHQ - 2 Score 0  Altered sleeping -  Tired, decreased energy -  Change in appetite -  Feeling bad or failure about yourself  -  Trouble concentrating -  Moving slowly or fidgety/restless -  Suicidal thoughts -  PHQ-9 Score -  Difficult doing work/chores -  Some recent data might be hidden   Fall Risk  09/13/2018 08/28/2018 02/01/2018 12/25/2017 12/06/2017  Falls in the past year? 1 0 1 No Yes  Comment - - - - -  Number falls in past yr: 1 0 1 - 2 or more  Injury with Fall? - - 1 - Yes  Comment - - - - -  Risk Factor Category  - - - - High Fall Risk  Risk for fall due to : History of fall(s);Impaired balance/gait;Impaired mobility - - - History of fall(s)  Follow up - Falls evaluation completed - - -    History/P.E. limitations: none  There are no preventive care reminders to display for this patient.  Diabetes Health Maintenance Due  Topic Date Due  . LIPID PANEL  08/11/2017    Health Maintenance Due  Topic Date Due  . LIPID PANEL  08/11/2017     Chief Complaint  Patient presents with  . Leg Swelling  . COPD     HPI  Bilateral leg edema  Office Visit 08/28/18: Ongoing issue since 05/17/2010 ov with bilateral ankle edema; dx venous stasis ulcer Right leg 10/2014, periods of no leg edema on ov exams; 01/29/16 BNP 550; 04/28/2016 ov increasing intermittent, lasix & leg elevation responsive bilateral leg edema; intermittent leg edema persists  into ov in 2019;  10/2017 venous ulcer left leg; leg edema becoming more persistent over 2019. SCr elevates higher dose lasix - Seen at [facility] with bilateral leg edema to knees. Bil venous insufficiency duplex was negative for venous insufficiency. Venous reflux study negative fo significant deep or superficial reflux.  Recommended leg elevation when possible and compression if tolerable. - No peripheral edema 07/12/18 [employer] OV with wt 171 lbs.  - Pt called cardiology with complaint of leg edema.  Leg venous doppler ordered.  No evidence DVT.  - Prior diagnostic workups have found no evidence of organ failure as source of edema.  No evidence of DVT on recent venous doppler by cardiology - Patient took lasix 40 mg day PTV with leg elevation with little improvement. She take 20 mg lasix daily. - Patient unable to doff 20-30 mmHg compression hose - Patient complained by phone yesterday of blisters on legs - treated with left leg Unna boot. It was removed 7/7 in office.  Not replaced as pt wanted to attend wedding.   09/14/18 - Patient found OTC compression stockings that she was able to don with help from her dgt.  - Stocking has decreased swelling in forelegs but less so t ankles.  - She has not taken any lasix - She denies blister or skin  injuries to legs/ankles/feet.   SH: no smoking  Review of Systems See hpi No fever No cough No palpitations No chest pains    Objective:   Physical Exam VS reviewed GEN: Alert, Cooperative, Groomed, NAD, wearing ACE bandage wraps lower half of forelegs HEENT: No JVD COR: irr irr, not tachycardic, No M/G/R LUNGS: BCTA, No Acc mm use, speaking in full sentences EXT: Wearing comptression stockings bil  Decrease of palp edema to 1+ shins to 2+ ankles.  Psych: Normal affect/thought/speech/language    Assessment & Plan:  25 minutes face to face where spent in total with counseling / coordination of care took more than 50% of the total time. Counseling involved discussion the  treatment options for her condition.

## 2018-09-14 NOTE — Assessment & Plan Note (Signed)
Established problem that has improved.  Continue use of compression stockings.  Will refer to Lymphedema Clinic.

## 2018-09-21 ENCOUNTER — Ambulatory Visit: Payer: Medicare Other | Admitting: Emergency Medicine

## 2018-10-04 DIAGNOSIS — K5901 Slow transit constipation: Secondary | ICD-10-CM | POA: Diagnosis not present

## 2018-10-04 DIAGNOSIS — D638 Anemia in other chronic diseases classified elsewhere: Secondary | ICD-10-CM | POA: Diagnosis not present

## 2018-10-10 ENCOUNTER — Other Ambulatory Visit: Payer: Self-pay | Admitting: Family Medicine

## 2018-10-10 DIAGNOSIS — D485 Neoplasm of uncertain behavior of skin: Secondary | ICD-10-CM | POA: Diagnosis not present

## 2018-10-10 DIAGNOSIS — R600 Localized edema: Secondary | ICD-10-CM

## 2018-10-10 DIAGNOSIS — I890 Lymphedema, not elsewhere classified: Secondary | ICD-10-CM

## 2018-10-10 NOTE — Progress Notes (Signed)
Referral submitted for Lymphedema Therapy at [place] [place]

## 2018-10-11 DIAGNOSIS — H353231 Exudative age-related macular degeneration, bilateral, with active choroidal neovascularization: Secondary | ICD-10-CM | POA: Diagnosis not present

## 2018-10-17 DIAGNOSIS — Z8551 Personal history of malignant neoplasm of bladder: Secondary | ICD-10-CM | POA: Diagnosis not present

## 2018-10-17 DIAGNOSIS — Z8553 Personal history of malignant neoplasm of renal pelvis: Secondary | ICD-10-CM | POA: Diagnosis not present

## 2018-10-25 ENCOUNTER — Ambulatory Visit (INDEPENDENT_AMBULATORY_CARE_PROVIDER_SITE_OTHER): Payer: Medicare Other | Admitting: Family Medicine

## 2018-10-25 ENCOUNTER — Encounter: Payer: Self-pay | Admitting: Family Medicine

## 2018-10-25 ENCOUNTER — Other Ambulatory Visit: Payer: Self-pay

## 2018-10-25 VITALS — BP 138/90 | HR 57 | Wt 169.0 lb

## 2018-10-25 DIAGNOSIS — J441 Chronic obstructive pulmonary disease with (acute) exacerbation: Secondary | ICD-10-CM

## 2018-10-25 DIAGNOSIS — Z0000 Encounter for general adult medical examination without abnormal findings: Secondary | ICD-10-CM | POA: Diagnosis not present

## 2018-10-25 DIAGNOSIS — J449 Chronic obstructive pulmonary disease, unspecified: Secondary | ICD-10-CM | POA: Diagnosis not present

## 2018-10-25 DIAGNOSIS — M542 Cervicalgia: Secondary | ICD-10-CM | POA: Diagnosis not present

## 2018-10-25 MED ORDER — AZITHROMYCIN 250 MG PO TABS: ORAL_TABLET | ORAL | 0 refills | Status: DC

## 2018-10-25 MED ORDER — PREDNISONE 10 MG PO TABS: 30.0000 mg | ORAL_TABLET | Freq: Every day | ORAL | 0 refills | Status: AC

## 2018-10-25 NOTE — Patient Instructions (Addendum)
I believe you are having a flare up of your COPD. Start Azithromycin antibiotic tablets, two tablets today, then one table a day for 4 more days. Start Prednisone 10 mg tablets, take 3 tablets at one time today, then 3 tablets at one time for 4 more days.   If your breathing gets worse, please contact Dr McDiarmid's office or go to the Emergency Department.   Return to the [facility] in 2 weeks for a nurse visit to get your Flu vaccination.   Please see Dr McDiarmid again in 4 weeks.  Bring all your medicines with you to the visit with him.

## 2018-10-26 ENCOUNTER — Encounter: Payer: Self-pay | Admitting: Family Medicine

## 2018-10-26 DIAGNOSIS — M542 Cervicalgia: Secondary | ICD-10-CM

## 2018-10-26 HISTORY — DX: Cervicalgia: M54.2

## 2018-10-26 NOTE — Assessment & Plan Note (Addendum)
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  No signs/symptoms of cervical cord involvement Recommendation of APAP, heat, ROM, gentle stretching neck posterior muscles  Contact office if does not improve or worsens.  Red flags of cord compression reviewed and told requires immediate F2F evaluation in ED.

## 2018-10-26 NOTE — Assessment & Plan Note (Signed)
Influenza vaccination postponed during this COPD flare. [facility] Nurse visit in two weeks for Flu vaccination (High potency)

## 2018-10-26 NOTE — Progress Notes (Signed)
Subjective:    Patient ID: [name], female    DOB: Nov 21, 1923, 85 y.o.   MRN: [id number] [name] is alone Sources of clinical information for visit is/are patient and past medical records. Nursing assessment for this office visit was reviewed with the patient for accuracy and revision.   Previous Report(s) Reviewed: none  Depression screen [facility] 2/9 10/25/2018  Decreased Interest 0  Down, Depressed, Hopeless 0  PHQ - 2 Score 0  Altered sleeping -  Tired, decreased energy -  Change in appetite -  Feeling bad or failure about yourself  -  Trouble concentrating -  Moving slowly or fidgety/restless -  Suicidal thoughts -  PHQ-9 Score -  Difficult doing work/chores -  Some recent data might be hidden   Fall Risk  10/25/2018 09/13/2018 08/28/2018 02/01/2018 12/25/2017  Falls in the past year? 1 1 0 1 No  Comment - - - - -  Number falls in past yr: 1 1 0 1 -  Injury with Fall? 1 - - 1 -  Comment - - - - -  Risk Factor Category  - - - - -  Risk for fall due to : - History of fall(s);Impaired balance/gait;Impaired mobility - - -  Follow up - - Falls evaluation completed - -   There are no preventive care reminders to display for this patient. Health Maintenance Due  Topic Date Due  . LIPID PANEL  08/11/2017  . INFLUENZA VACCINE  09/29/2018    History/P.E. limitations: dementia  There are no preventive care reminders to display for this patient.  Diabetes Health Maintenance Due  Topic Date Due  . LIPID PANEL  08/11/2017    Health Maintenance Due  Topic Date Due  . LIPID PANEL  08/11/2017  . INFLUENZA VACCINE  09/29/2018     Chief Complaint  Patient presents with  . Breathing Problem  . Leg Swelling     HPI  COPD - onset many years ago - Has been staying home since onset of pandemic restrictions.  Only visitor in her home is her dgt who maintains social distance and masks.  Dgt is without illness symptoms.   - Recent onset of low grade cough with yellowish  sputum.  - Comfortable breathing at rest.  Gets breathless with walking more than 100 ft if not at her own pace.  Able to complete ADLs without dyspnea if she takes her time.  - No fever/sore throat/headache/myalgias/GI symptoms. - No orthopnea, no nocturnal dyspnea - Ankle edema only bilaterally that improves when wearing OTC compression hose and when keeping legs up - Using Lasix about once a week.   Neck discomfort - Onset 3-4 days ago - Awoke with "crink" in neck\- improved today.  Has been stretching neck to relieve discomfort.  Has taken only APAP for discomfort.  - no arm pain.  No arm weakness, no difficulty voiding. No clumsiness in legs.   SH: distant former smoking  Review of Systems See HPI    Objective:   Physical Exam VS: revieved GEN: alert, cooperative and no distress HEENT: Head: Normocephalic, no lesions, without obvious abnormality. Tender points alongedges of right trapesium cervical region. Normal appearance.  COR: Heart sounds are normal.  Regular rate and rhythm without murmur, gallop or rub. LUNG: No retractions, rales throughout that are same sound as over midline upper thorax, No wheezes EXT: 1+ bilateral pedal edema; full swing gait, slightly slower gait speed.  Able to rise from chair without  difficulty.  End of distance from waiting room and exam room.  NEURO: alert, oriented, normal speech, no focal findings or movement disorder noted. 0 triceps bilateral DTR; +1 Biceps bilateral DTR, bil 5/5 strength grasp, elbow flex/ext bil, 5/5 shoulder abd/add at 90 degrees.  PSYCH: normal speech, some perseveration on topic of neck discomfort, well-groomed, socially engaging    Assessment & Plan:

## 2018-10-26 NOTE — Assessment & Plan Note (Addendum)
Possible mild COPD exacerbation While CoViD complications risk is medium, her exacerbation is sterotypical for her COPD exacerbations.  The symptoms are not systemic.  Pt is already quarantining herself.   Rx Z-Pak x 1 Rx prednisone 30 mg daily x 5 days.  Reviewed reasons to contact office and reasons to seek immediate F2F evaluation at ED.

## 2018-10-31 ENCOUNTER — Other Ambulatory Visit: Payer: Self-pay

## 2018-10-31 DIAGNOSIS — D485 Neoplasm of uncertain behavior of skin: Secondary | ICD-10-CM | POA: Diagnosis not present

## 2018-10-31 DIAGNOSIS — L570 Actinic keratosis: Secondary | ICD-10-CM | POA: Diagnosis not present

## 2018-11-06 ENCOUNTER — Other Ambulatory Visit: Payer: Self-pay

## 2018-11-06 DIAGNOSIS — M48061 Spinal stenosis, lumbar region without neurogenic claudication: Secondary | ICD-10-CM

## 2018-11-06 DIAGNOSIS — M1710 Unilateral primary osteoarthritis, unspecified knee: Secondary | ICD-10-CM

## 2018-11-06 DIAGNOSIS — IMO0002 Reserved for concepts with insufficient information to code with codable children: Secondary | ICD-10-CM

## 2018-11-06 DIAGNOSIS — M4316 Spondylolisthesis, lumbar region: Secondary | ICD-10-CM

## 2018-11-06 MED ORDER — HYDROCODONE-ACETAMINOPHEN 5-325 MG PO TABS: 0.5000 | ORAL_TABLET | Freq: Four times a day (QID) | ORAL | 0 refills | Status: DC | PRN

## 2018-11-14 ENCOUNTER — Ambulatory Visit (INDEPENDENT_AMBULATORY_CARE_PROVIDER_SITE_OTHER): Payer: Medicare Other | Admitting: *Deleted

## 2018-11-14 ENCOUNTER — Ambulatory Visit: Payer: Medicare Other

## 2018-11-14 ENCOUNTER — Other Ambulatory Visit: Payer: Self-pay

## 2018-11-14 DIAGNOSIS — Z23 Encounter for immunization: Secondary | ICD-10-CM | POA: Diagnosis not present

## 2018-11-15 DIAGNOSIS — H353231 Exudative age-related macular degeneration, bilateral, with active choroidal neovascularization: Secondary | ICD-10-CM | POA: Diagnosis not present

## 2018-11-22 ENCOUNTER — Other Ambulatory Visit: Payer: Self-pay | Admitting: Cardiology

## 2018-11-26 ENCOUNTER — Telehealth: Payer: Self-pay | Admitting: *Deleted

## 2018-11-26 NOTE — Telephone Encounter (Signed)
Minette Brine called to verify if pt was still taking metoprolol.    LMOVM for Olivia to call back. Christen Bame, CMA

## 2018-11-27 NOTE — Telephone Encounter (Signed)
There is no discontinuation of metoprolol on my review of chart.    Please advise  Mrs Benge to continue her metoprolol succinate 25 mg daily.

## 2018-11-27 NOTE — Telephone Encounter (Signed)
Daughter aware and states that patient has been taking them but they just wanted to clarify due to some confusion.  Shriyan Arakawa,CMA

## 2018-11-29 ENCOUNTER — Other Ambulatory Visit: Payer: Self-pay

## 2018-11-29 ENCOUNTER — Encounter: Payer: Self-pay | Admitting: Family Medicine

## 2018-11-29 ENCOUNTER — Ambulatory Visit (INDEPENDENT_AMBULATORY_CARE_PROVIDER_SITE_OTHER): Payer: Medicare Other | Admitting: Family Medicine

## 2018-11-29 VITALS — BP 128/74 | HR 83 | Wt 174.0 lb

## 2018-11-29 DIAGNOSIS — Z7901 Long term (current) use of anticoagulants: Secondary | ICD-10-CM | POA: Diagnosis not present

## 2018-11-29 DIAGNOSIS — Z905 Acquired absence of kidney: Secondary | ICD-10-CM

## 2018-11-29 DIAGNOSIS — D500 Iron deficiency anemia secondary to blood loss (chronic): Secondary | ICD-10-CM | POA: Diagnosis not present

## 2018-11-29 DIAGNOSIS — J209 Acute bronchitis, unspecified: Secondary | ICD-10-CM | POA: Diagnosis not present

## 2018-11-29 DIAGNOSIS — Z79899 Other long term (current) drug therapy: Secondary | ICD-10-CM

## 2018-11-29 DIAGNOSIS — J440 Chronic obstructive pulmonary disease with acute lower respiratory infection: Secondary | ICD-10-CM | POA: Diagnosis not present

## 2018-11-29 DIAGNOSIS — N184 Chronic kidney disease, stage 4 (severe): Secondary | ICD-10-CM | POA: Diagnosis not present

## 2018-11-29 DIAGNOSIS — E7800 Pure hypercholesterolemia, unspecified: Secondary | ICD-10-CM

## 2018-11-29 DIAGNOSIS — I252 Old myocardial infarction: Secondary | ICD-10-CM | POA: Diagnosis not present

## 2018-11-29 MED ORDER — AZITHROMYCIN 250 MG PO TABS: ORAL_TABLET | ORAL | 1 refills | Status: DC

## 2018-11-29 NOTE — Patient Instructions (Signed)
Try to decrease the omeprazole to one capsule every other day.  Watch for worsening heartburn or indigestion.  If these occur, then return to taking one capsule daily.  Eventually, it would be good for you to not have to take the omeprazole at all.   We are checking your electrolytes, kidneys, liver, hemoglobin, white cell count and cholesterol today.   Dr Brennley Curtice will call you if your tests are not good. Otherwise he will send you a letter.  If you sign up for MyChart online, you will be able to see your test results once Dr Kamuela Magos has reviewed them.  If you do not hear from [place] with in 2 weeks please call our office.  Start a 5 day course of Azithromycin to decrease your purulent sputum and worsened shortness of breath with wheezing.   If your breathing worsens, please call our office.

## 2018-11-30 LAB — CBC
Hematocrit: 33.7 % — ABNORMAL LOW (ref 34.0–46.6)
Hemoglobin: 10.5 g/dL — ABNORMAL LOW (ref 11.1–15.9)
MCH: 29.2 pg (ref 26.6–33.0)
MCHC: 31.2 g/dL — ABNORMAL LOW (ref 31.5–35.7)
MCV: 94 fL (ref 79–97)
Platelets: 235 10*3/uL (ref 150–450)
RBC: 3.59 x10E6/uL — ABNORMAL LOW (ref 3.77–5.28)
RDW: 15.1 % (ref 11.7–15.4)
WBC: 7.7 10*3/uL (ref 3.4–10.8)

## 2018-11-30 LAB — CMP14+EGFR
ALT: 38 IU/L — ABNORMAL HIGH (ref 0–32)
AST: 41 IU/L — ABNORMAL HIGH (ref 0–40)
Albumin/Globulin Ratio: 1.7 (ref 1.2–2.2)
Albumin: 3.8 g/dL (ref 3.5–4.6)
Alkaline Phosphatase: 91 IU/L (ref 39–117)
BUN/Creatinine Ratio: 19 (ref 12–28)
BUN: 29 mg/dL (ref 10–36)
Bilirubin Total: 0.8 mg/dL (ref 0.0–1.2)
CO2: 24 mmol/L (ref 20–29)
Calcium: 8.8 mg/dL (ref 8.7–10.3)
Chloride: 105 mmol/L (ref 96–106)
Creatinine, Ser: 1.54 mg/dL — ABNORMAL HIGH (ref 0.57–1.00)
GFR calc Af Amer: 34 mL/min/{1.73_m2} — ABNORMAL LOW (ref >59)
GFR calc non Af Amer: 29 mL/min/{1.73_m2} — ABNORMAL LOW (ref >59)
Globulin, Total: 2.3 g/dL (ref 1.5–4.5)
Glucose: 132 mg/dL — ABNORMAL HIGH (ref 65–99)
Potassium: 4.5 mmol/L (ref 3.5–5.2)
Sodium: 142 mmol/L (ref 134–144)
Total Protein: 6.1 g/dL (ref 6.0–8.5)

## 2018-11-30 LAB — LDL CHOLESTEROL, DIRECT: LDL Direct: 59 mg/dL (ref 0–99)

## 2018-12-03 ENCOUNTER — Telehealth: Payer: Self-pay | Admitting: Family Medicine

## 2018-12-03 ENCOUNTER — Encounter: Payer: Self-pay | Admitting: Family Medicine

## 2018-12-03 NOTE — Telephone Encounter (Signed)
Pt's dgt, who lives with patient, had been ill on 11/30/18 with generalized symptoms but this has resolved.  Mrs Sukhu reports feeling well.   Observe

## 2018-12-03 NOTE — Assessment & Plan Note (Signed)
CBC:    Component Value Date/Time   WBC 7.7 11/29/2018 1310   WBC 5.6 01/13/2018 0356   HGB 10.5 (L) 11/29/2018 1310   HCT 33.7 (L) 11/29/2018 1310   PLT 235 11/29/2018 1310   MCV 94 11/29/2018 1310   NEUTROABS 5.5 01/10/2018 1521   LYMPHSABS 1.1 01/10/2018 1521   MONOABS 1.3 (H) 01/10/2018 1521   EOSABS 0.1 01/10/2018 1521   BASOSABS 0.0 01/10/2018 1521   Stable hgb.  Monitor periodically

## 2018-12-03 NOTE — Assessment & Plan Note (Signed)
Lipid Panel     Component Value Date/Time   CHOL 130 08/11/2016 1101   TRIG 106 08/11/2016 1101   HDL 37 (L) 08/11/2016 1101   CHOLHDL 3.5 08/11/2016 1101   CHOLHDL 3.4 07/30/2015 1231   VLDL 29 07/30/2015 1231   LDLCALC 72 08/11/2016 1101   LDLDIRECT 59 11/29/2018 1310   LDLDIRECT 74 05/08/2014 1110   LABVLDL 21 08/11/2016 1101   Established problem Controlled Continue current therapy regiment.

## 2018-12-03 NOTE — Progress Notes (Signed)
Subjective:    Patient ID: [name], female    DOB: 07/25/1923, 85 y.o.   MRN: [id number] [name] is alone Sources of clinical information for visit is/are patient and past medical records. Nursing assessment for this office visit was reviewed with the patient for accuracy and revision.   Previous Report(s) Reviewed: historical medical records  Depression screen [facility] 2/9 11/29/2018  Decreased Interest 0  Down, Depressed, Hopeless 0  PHQ - 2 Score 0  Altered sleeping -  Tired, decreased energy -  Change in appetite -  Feeling bad or failure about yourself  -  Trouble concentrating -  Moving slowly or fidgety/restless -  Suicidal thoughts -  PHQ-9 Score -  Difficult doing work/chores -  Some recent data might be hidden   Fall Risk  11/29/2018 10/25/2018 09/13/2018 08/28/2018 02/01/2018  Falls in the past year? 1 1 1  0 1  Comment - - - - -  Number falls in past yr: 1 1 1  0 1  Injury with Fall? 1 1 - - 1  Comment - - - - -  Risk Factor Category  - - - - -  Risk for fall due to : - - History of fall(s);Impaired balance/gait;Impaired mobility - -  Follow up - - - Falls evaluation completed -   There are no preventive care reminders to display for this patient. There are no preventive care reminders to display for this patient.  History/P.E. limitations: dementia, mild  There are no preventive care reminders to display for this patient.  Diabetes Health Maintenance Due  Topic Date Due  . LIPID PANEL  11/29/2019   There are no preventive care reminders to display for this patient.   Chief Complaint  Patient presents with  . Follow-up  . Back Pain     HPI  CoVid19 Risk factors - Medium Risk Travel to CoVid19 high risk areas in last 14 days from date of symptom onset: no  Exposure to laboratory confirmed [facility] patient in last 14 days: no  Exposure to a Person Under Investigation (PUI) in last 3 days: no  Known exposure to any person, including health care worker,  who has had close contact with a laboratory-confirmed CoVid19 patient within last 14 days: no  Occupation: retired   COUGH Onset: 3 to 4 days ago  Course stable   Severity: mild    Flu symptoms (Acute onset symptoms, Fever, Cough, malaise, absence nasal congestion, absence sneezing) Presence in Community:  no or Sick Contact:  no Headache: no Muscle aches: no Severe fatigue: no  UPPER RESPIRATORY INFECTION cough described as productive of yellow sputum and post nasal drip Sick contacts: no   Allergy symptoms: No rhinorrhea, sneezing, eye itching and wheezing PMH Eczema: no  Itchy scratchy throat: no  Seasonal sxs: no   Symptoms Shortness of breath:yes, mild, less than last visit  Leg Swelling:yes, but at baseline  Heart Burn or Reflux:no  Wheezing:no  Post Nasal Drip: yes  Rhinorrhea: no    PMH Asthma or COPD: yes, COPD  PMH of Smoking: yes, distant  Using ACEIs: no     SH:  Social History   Tobacco Use  Smoking Status Former Smoker  . Packs/day: 1.00  . Types: Cigarettes  . Quit date: 03/02/2009  . Years since quitting: 9.7  Smokeless Tobacco Never Used    ROS: See HPI  PHYSICAL EXAM Vitals:   11/29/18 1020  BP: 128/74  Pulse: 83  SpO2: 93%  General: alert, cooperative, no distress HEENT: Eye: Normal external eye, conjunctiva, lids cornea, PERL. Ears: Normal TM's bilaterally. Normal auditory canals and external ears. Non-tender. Nose: Normal external nose, mucus membranes and septum. Neck / Thyroid: Supple, no masses, nodes, nodules or enlargement. Cor: Heart sounds are normal.  Regular rate and rhythm without murmur, gallop or rub. Lungs: clear to auscultation bilaterally, no aa mm use Ext: Warm and well perfused, No cyanosis, Trace ankle edema bilaterally      Assessment/Plan    Cough Ddx: Viral URI Postnasal drip / Upper Airway Cough Postinfectious, postviral cough Bronchitis - COPD Amiodarone  Working dx of Acute exacerbation  of COPD - While CoVid was a possiblity, she has been keeping out of public and having visitors.  She has had no exposures.  Symptoms are stereotypical for her COPD exacerbations/.  - Plan: Z-Pak for antibacterial and mild antiinflammatory effect.  - No corticosteroids at this time.  Monitor -           Review of Systems     Objective:   Physical Exam        Assessment & Plan:

## 2018-12-03 NOTE — Assessment & Plan Note (Signed)
Lab Results  Component Value Date   CREATININE 1.54 (H) 11/29/2018   CREATININE 1.72 (H) 08/28/2018   CREATININE 1.55 (H) 07/12/2018   Stable. Continue current therapy

## 2018-12-04 ENCOUNTER — Telehealth: Payer: Self-pay | Admitting: Emergency Medicine

## 2018-12-04 MED ORDER — FLUTICASONE PROPIONATE 50 MCG/ACT NA SUSP: NASAL | 3 refills | Status: AC

## 2018-12-04 NOTE — Telephone Encounter (Signed)
Spoke with pt. She is needing a refill on Flonase. Rx has been sent in. Nothing further was needed.

## 2018-12-11 ENCOUNTER — Encounter: Payer: Self-pay | Admitting: Emergency Medicine

## 2018-12-11 ENCOUNTER — Ambulatory Visit (INDEPENDENT_AMBULATORY_CARE_PROVIDER_SITE_OTHER): Payer: Medicare Other | Admitting: Emergency Medicine

## 2018-12-11 DIAGNOSIS — J449 Chronic obstructive pulmonary disease, unspecified: Secondary | ICD-10-CM

## 2018-12-11 MED ORDER — TRELEGY ELLIPTA 100-62.5-25 MCG/INH IN AEPB: 1.0000 | INHALATION_SPRAY | Freq: Every day | RESPIRATORY_TRACT | 0 refills | Status: DC

## 2018-12-11 NOTE — Progress Notes (Signed)
GERD  Subjective:    Patient ID: [name], female    DOB: 1923-06-21, 85 y.o.   MRN: [id number]  COPD She complains of shortness of breath. There is no cough or wheezing. Pertinent negatives include no chest pain, ear pain, fever, headaches, postnasal drip, rhinorrhea, sneezing, sore throat or trouble swallowing. Her past medical history is significant for COPD.    ROV 02/13/18 --[name] is 85 with a history of severe COPD, frequent bronchitic symptoms and chronic cough.  She also has a history of A. fib, hypertension with diastolic CHF, chronic kidney disease and a hiatal hernia with GERD.  We have been managing her on Trelegy and DuoNeb. She has never been qualified for o2. She describes some worsening in her overall exertional tolerance since last time. She has albuterol but does not use frequently. We had planned to use DuoNeb TID. She is coughing minimally.   ROV 03/20/18 --follow-up visit for [name], 85 year old woman with a history of severe COPD with a bronchitic phenotype, frequent bronchitic symptoms, chronic cough.  She is also followed and treated for atrial fibrillation, chronic kidney disease, hypertension plus diastolic CHF, hiatal hernia with GERD. She is having a lot of exertional dyspnea, also more sx when it is cold out. She is using Trelegy, has DuoNeb available - uses 2-3x a day. Minimal cough, she does hear wheeze frequently. She fell yesterday and hurt her R hand, skin tear. It still hurts her.   ROV 09/11/2018 --and is 85 with a history of severe COPD, chronic bronchitis with frequent bronchitic symptoms.  Chronic cough.  She also has a history of GERD with hiatal hernia, hypertension, atrial fibrillation, CKD with diastolic dysfunction. She is on diuretics, uses compression hose.   We have been managing her on Trelegy and DuoNeb's as needed - using less frequently lately. She has been having a bit more exertional SOB, seems to coincide with her LE edema levels. She has  trouble in the humid air.  She is not on oxygen - has not qualified.   ROV 12/11/2018 --this is a follow-up visit for [name] who is 85, has a history of severe COPD with chronic bronchitis and frequent bronchitic symptoms, associated chronic cough.  She also has atrial fibrillation hypertension, chronic kidney disease with diastolic dysfunction (on diuretic regimen), GERD.  We have been managing her with Trelegy and DuoNeb as needed.  She reports today that she is doing fairly well - breathing is a bit more limiting since her activities have been curtailed for COVID isolation. She is tolerating trelegy. She has been using her albuterol nebs a few times a week. Has SOB with climbing stairs, has fallen twice. She coughs in the am, lots of mucous, less so during the day. Uses flonase.    Review of Systems  Constitutional: Negative for fever and unexpected weight change.  HENT: Positive for congestion. Negative for dental problem, ear pain, nosebleeds, postnasal drip, rhinorrhea, sinus pressure, sneezing, sore throat and trouble swallowing.   Eyes: Negative for redness and itching.  Respiratory: Positive for shortness of breath. Negative for cough, chest tightness and wheezing.   Cardiovascular: Negative for chest pain, palpitations and leg swelling.  Gastrointestinal: Negative for nausea and vomiting.  Genitourinary: Negative for dysuria.  Musculoskeletal: Negative for joint swelling.  Skin: Negative for rash.  Neurological: Negative for headaches.  Hematological: Does not bruise/bleed easily.  Psychiatric/Behavioral: Negative for dysphoric mood. The patient is not nervous/anxious.         Objective:  Physical Exam  Vitals:   12/11/18 1058  BP: 128/86  Pulse: 84  SpO2: 96%  Weight: 175 lb (79.4 kg)   Gen: Pleasant, elderly woman in no distress,  normal affect  ENT: No lesions,  mouth clear,  oropharynx clear, no postnasal drip, no upper airway noise  Neck: No JVD, no stridor  Lungs:  No use of accessory muscles, clear on a normal breath, minimal exp wheeze on a forced exp  Cardiovascular: Irregular, heart sounds normal, no murmur or gallops, no peripheral edema  Musculoskeletal: No deformities  Neuro: alert, non focal  Skin: Warm, no lesions or rash      Assessment & Plan:  COPD, severe (HCC) Overall stable interval.  She does still have some daily symptoms that include dyspnea.  She may be under using her albuterol nebs and of asked her to be a bit more liberal with this.  We did talk about the potential side effect of tachycardia since she has atrial fibrillation.  She will pay attention to any possible side effects.  Please continue your Trelegy daily. Rinse and gargle after using.  Continue to use your albuterol nebs as you need them for shortness of breath, mucous production, wheezing.  Your PNA and flu shots are up to date Follow with Dr Lamonte Sakai in 4 months or sooner if you have any problems.  Baltazar Apo, MD, PhD 12/11/2018, 1:31 PM [facility] [phone] or if no answer [phone]

## 2018-12-11 NOTE — Patient Instructions (Addendum)
Please continue your Trelegy daily. Rinse and gargle after using.  Continue to use your albuterol nebs as you need them for shortness of breath, mucous production, wheezing.  Your PNA and flu shots are up to date Follow with Dr Lamonte Sakai in 4 months or sooner if you have any problems.

## 2018-12-11 NOTE — Assessment & Plan Note (Signed)
Overall stable interval.  She does still have some daily symptoms that include dyspnea.  She may be under using her albuterol nebs and of asked her to be a bit more liberal with this.  We did talk about the potential side effect of tachycardia since she has atrial fibrillation.  She will pay attention to any possible side effects.  Please continue your Trelegy daily. Rinse and gargle after using.  Continue to use your albuterol nebs as you need them for shortness of breath, mucous production, wheezing.  Your PNA and flu shots are up to date Follow with Dr Lamonte Sakai in 4 months or sooner if you have any problems.

## 2018-12-13 DIAGNOSIS — H35033 Hypertensive retinopathy, bilateral: Secondary | ICD-10-CM | POA: Diagnosis not present

## 2018-12-13 DIAGNOSIS — H353211 Exudative age-related macular degeneration, right eye, with active choroidal neovascularization: Secondary | ICD-10-CM | POA: Diagnosis not present

## 2018-12-13 DIAGNOSIS — H43813 Vitreous degeneration, bilateral: Secondary | ICD-10-CM | POA: Diagnosis not present

## 2018-12-13 DIAGNOSIS — H353231 Exudative age-related macular degeneration, bilateral, with active choroidal neovascularization: Secondary | ICD-10-CM | POA: Diagnosis not present

## 2018-12-26 NOTE — Progress Notes (Signed)
Cardiology Office Note:    Date:  12/27/2018   ID:  BURNETTA KOHLS, DOB 1923-12-19, MRN [id number]  PCP:  McDiarmid, Blane Ohara, MD  Cardiologist:  Sinclair Grooms, MD   Referring MD: McDiarmid, Blane Ohara, MD   Chief Complaint  Patient presents with  . Atrial Fibrillation  . Congestive Heart Failure  . Coronary Artery Disease    History of Present Illness:    [name] is a 85 y.o. female with a hx of right coronary BM stent, paroxysmal atrial fibrillation but rhythm control on amiodarone therapy, chronic anticoagulation therapy, chronic diastolic heart failure, hypertension, and pacemaker for tachybradycardia syndrome.  Decompensated acute diastolic heart failure August 2019 related to persistent AF.  She feels well.  She looks forward to her 89rd birthday which will occur prior to January 1.  Breathing is been stable.  No lower extremity swelling has occurred.  She denies orthopnea.  She now wears support stockings to prevent excessive lower extremity swelling.  She is not having chest pain.  She has not had syncope.  Nitroglycerin use has not been required.  Past Medical History:  Diagnosis Date  . Abnormal mammogram, unspecified 08/23/2010   Followup imaging reassuring.   . Acute appendicitis with rupture   . Acute on chronic diastolic heart failure ([place]) 10/16/2017  . ADENOMATOUS COLONIC POLYP 03/01/2003   Qualifier: Diagnosis of  By: McDiarmid MD, Sherren Mocha Multiple benign polyps of cecum, ascending, transverse and sigmoid colon by 8/09 colonoscopy by Dr Cristina Gong  Colonoscopy by Dr Cristina Gong for iron-deficiency anemia on 04/27/2010 showed three sessile polyps that were in ascending (3 mm x 9 mm), transverse (4 mm), and cecum (3 mm).  All three were tubular adenomas that were negative for high grade dysplasia or malignancy on pathology. Dr Cristina Gong called the polpys benign and not requiring follow-up in view of the patients age.    . Adrenal adenoma    Incidentaloma  . AF (paroxysmal atrial  fibrillation) ([place]) 11/11/2010   Hospitalization (9/8-9/10, Dr Daneen Schick, III, Cardiology) for Paroxysmal Atrial Fibrillation with RVR and anginal pain secondary to demand/supply mismatch in setting of RVR with known circumflex artery branch disease.    Marland Kitchen AKI (acute kidney injury) ([place])   . ANXIETY 04/27/2006   Qualifier: Diagnosis of  By: McDiarmid MD, Sherren Mocha    . Benign essential tremor 02/04/2016  . Candidiasis of the esophagus 11/26/2007  . CAP (community acquired pneumonia) 02/25/2015  . Cataract 2013   Bilateral   . Cellulitis of leg, right 10/01/2012  . Cellulitis of right lower extremity   . Chest pain with moderate risk of acute coronary syndrome 05/21/2015  . Chronic kidney disease (CKD), stage III (moderate)   . Concussion with loss of consciousness   . COPD 04/27/2006   Qualifier: Diagnosis of  By: McDiarmid MD, Sherren Mocha    . COPD exacerbation ([place])   . COPD, severe   . Decreased functional mobility and endurance 03/17/2015  . Dementia [facility]), possible Alzhemier's type 10/26/2012   Montreal Cognitive Assessment (25 out of 30, points off in visuospatial and and short-term memory).  Independent in iADLs and ADLs.    . Diastolic heart failure ([place]) 07/30/2015  . DISC WITH RADICULOPATHY 04/27/2006   Qualifier: Diagnosis of  By: McDiarmid MD, Sherren Mocha    . Dyspnea   . EDEMA-LEGS,DUE TO VENOUS OBSTRUCT. 04/27/2006   Qualifier: Diagnosis of  By: McDiarmid MD, Sherren Mocha    . Fall 10/27/2016  . Fracture of 5th metatarsal, Right,  avulsion fx of tip styloid 11/09/2017  . GERD (gastroesophageal reflux disease) 11/08/2015  . 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 Normal uric acid serum level at time of attack.    . Hand trauma, right, initial encounter 03/20/2018  . HERNIA, HIATAL, NONCONGENITAL 04/27/2006   Qualifier: Diagnosis of  By: McDiarmid MD, Sherren Mocha    . High risk medications (not anticoagulants) long-term use 03/05/2012  . History  of Hemorrhoids 04/27/2006   Qualifier: Diagnosis of  By: McDiarmid MD, Sherren Mocha    . History of iron deficiency 01/16/2015  . Hx of colonoscopy with polypectomy 04/27/2010   Dr Cristina Gong found three  tubular adenomas each less than 10 mm size  . Hypertension   . Hypotension 07/09/2015  . Impaired mobility and ADLs 09/01/2017  . Iron deficiency anemia 08/05/2010   Dr Cristina Gong (GI) has evaluated with EGD, colonoscopy, and video capsular endoscopy in 2011 & 2012.  All have been unrevealing as to an origin of IDA.  OV with Dr Cristina Gong (10/28/10) assessment of blood in stool per hemoccult and GER. Hbg 12.1 g/dL, MCV 91.8, Ferritin 30 ng/mL. Patient taking on ferrous sulfate tab daily.   EGD on 03/06/12 by Dr Cristina Gong for IDA non-obstructing Schatzki's ring at Gastroesophageal junction, otherwise normal esophagus and stomach.     . Laceration of arm, unspecified laterality, initial encounter 11/03/2017  . Left leg cellulitis   . Left Metatarsal fracture, 5th, avulsion styloid 12/29/2017  . Leg cramps 05/29/2012  . Low back pain   . Lumbar herniated disc    History of HNP L4/5 in 2003  . Macular degeneration, bilateral 10/04/2010   Right eye is wet MD, the other is dry macular degeneration (ARMD). Pt undergoing some form of vascular endothelial growth factor inhibition intraocular therapy.    . Mild cognitive impairment 10/26/2012   (10/25/12) Failed MiniCog screen  . MUSCLE CRAMPS 03/11/2010   Qualifier: Diagnosis of  By: McDiarmid MD, Sherren Mocha    . Muscle spasm of back 09/24/2013  . Myocardial infarct, old   . Nocturia 10/28/2016  . Numbness and tingling in hands 07/21/2011  . Pacemaker    MDT  . Paroxysmal atrial fibrillation (HCC)   . Persistent atrial fibrillation ([place]) 05/31/2014  . PREDIABETES 09/11/2007   Qualifier: Diagnosis of  By: McDiarmid MD, Sherren Mocha    . Retinal hemorrhage of left eye 06/2010  . RHINITIS, ALLERGIC 04/27/2006   Qualifier: Diagnosis of  By: McDiarmid MD, Sherren Mocha    . SCHATZKI'S RING, HX OF 11/26/2007    Qualifier: Diagnosis of  By: McDiarmid MD, Sherren Mocha  An EGD was performed by Dr Cristina Gong on 04/27/2010 for iron deficiency anemia. There was a a transient hiatal hernia with Schatzki's ring. Stomach and duodenum were normal. EGD on 03/06/12 by Dr Cristina Gong for IDA non-obstructing Schatzki's ring at Gastroesophageal junction, otherwise normal esophagus and stomach.    . Seborrheic keratosis, right anterior thigh 12/12/2013  . Shoulder pain, left 01/09/2014  . SICK SINUS SYNDROME 04/27/2006   S/P dual chamber pacemaker placement by Dr Osie Cheeks (EPS-Card) with pacemaker lead extraction & reinsertion - 07/25/2003  Hospitalization (9/8-9/10, Dr Daneen Schick, III, Cardiology) for Paroxysmal Atrial Fibrillation with RVR and anginal pain secondary to demand/supply mismatch in setting of RVR with known circumflex artery branch disease.    . Sick sinus syndrome with tachycardia ([place])    MDT  . Soft tissue injury of foot 05/03/2011  .  Solar lentigo 06/15/2012  . Solitary kidney, acquired 05/17/2010   Surgical removal for transitional cell cancer by Tresa Endo, MD ([place]). Surveillance cystoscopy by Dr Alinda Money [facility]) on 10/19/12 without evidence of cystoscopic recurrence. Recommend RTC one year for cystoscopy.   Marland Kitchen Spinal stenosis, lumbar   . Transitional cell carcinoma of ureter, history   . Traumatic ecchymosis of left foot 12/07/2017  . Trigger point with neck pain 10/26/2018  . Urge incontinence 12/13/2011   Diagnosed in 10/2011 by Dr Bjorn Loser (Urology)   . Venous stasis ulcer of ankle, left ([place]) 10/24/2014  . VENTRICULAR HYPERTROPHY, LEFT 08/28/2008   Qualifier: Diagnosis of  By: McDiarmid MD, Sherren Mocha    . VITAMIN B12 DEFICIENCY 10/07/2009   Qualifier: Diagnosis of  By: McDiarmid MD, Sherren Mocha  Dx based on a post-TKR anemia work-up Low normal serum B12 with high Methylmalonic acid and homocysteine level  Vit B12 serum level (10/28/10) > 1500 pg/mL   . Vitamin D deficiency 11/02/2010   Serum vitamin D 25(OH)  = 10.9 ng/mL (30 -100) on 10/28/10 c/w Vitamin D deficiency.     . Wound of left foot 12/29/2017    Past Surgical History:  Procedure Laterality Date  . BREAST SURGERY     breast reduction  . CARDIOVERSION N/A 10/20/2017   Procedure: CARDIOVERSION;  Surgeon: Josue Hector, MD;  Location[facility] ENDOSCOPY;  Service: Cardiovascular;  Laterality: N/A;  . CHOLECYSTECTOMY    . CORONARY ANGIOPLASTY WITH STENT PLACEMENT  2010   BMS RCA, OM2 occluded  . CYSTOSCOPY  09/30/2015   No cystoscopic evidence of uroepithelial neoplasm.  Follow up surveillance cystoscopy in one year  . CYSTOSCOPY  10/13/2017   Dr Raynelle Bring [facility])  . DUPUYTREN CONTRACTURE RELEASE Right 05/22/2014   Procedure: DUPUYTREN RELEASE AND REPAIR AS NECESSARY RIGHT RING FINGER AND MIDDLE FINGER;  Surgeon: Roseanne Kaufman, MD;  Location: [place];  Service: Orthopedics;  Laterality: Right;  . ESOPHAGOGASTRODUODENOSCOPY  04/27/2010   Dr Cristina Gong - found transient H/H & Schatzki's ring  . ESOPHAGOGASTRODUODENOSCOPY ENDOSCOPY  03/06/2012   Dr Cristina Gong - found non-obstuctive Schatzki's ring at [employer] [facility] o/w normal EGD.   Marland Kitchen EYE SURGERY     bilateral cataracts  . KNEE ARTHROSCOPY W/ SYNOVECTOMY  11/2009, left knee   Dr Wynelle Link  . NEPHRECTOMY  For transition cell cancer    Dr Tresa Endo, surgeon  . PACEMAKER GENERATOR CHANGE N/A 11/12/2012   Procedure: PACEMAKER GENERATOR CHANGE;  Surgeon: Evans Lance, MD; [employer] [facility] dual-chamber pacemaker serial number [id number]; Laterality: Right  . PACEMAKER INSERTION  2005   Dr Doreatha Lew  . PACEMAKER LEAD REMOVAL  2005   Removal and reinsertion of atrial and ventricular leads due to migration  . REPLACEMENT TOTAL KNEE  2009, Right knee   Dr Wynelle Link  . REPLACEMENT TOTAL KNEE  05/2009, Left knee   Dr Wynelle Link  . TONSILLECTOMY    . TRIGGER FINGER RELEASE Right 05/22/2014   Procedure: RIGHT HAND A-1 PULLEY RELEASE ;  Surgeon: Roseanne Kaufman, MD;  Location: [place];  Service: Orthopedics;   Laterality: Right;    Current Medications: Current Meds  Medication Sig  . albuterol (PROVENTIL) (2.5 MG/3ML) 0.083% nebulizer solution USE 1 VIAL VIA NEBULIZER EVERY 4 HOURS AS NEEDED FOR WHEEZING OR SHORTNESS OF BREATH  . albuterol (VENTOLIN HFA) 108 (90 Base) MCG/ACT inhaler Inhale 2 puffs into the lungs every 4 (four) hours as needed for wheezing or shortness of breath.  Marland Kitchen amiodarone (PACERONE) 200 MG tablet  Take 1 tablet (200 mg total) by mouth daily.  Marland Kitchen atorvastatin (LIPITOR) 20 MG tablet TAKE 1 TABLET BY MOUTH DAILY  . ELIQUIS 2.5 MG TABS tablet TAKE 1 TABLET(2.5 MG) BY MOUTH TWICE DAILY  . fluticasone (FLONASE) 50 MCG/ACT nasal spray SHAKE LIQUID AND USE 2 SPRAYS IN EACH NOSTRIL DAILY  . Fluticasone-Umeclidin-Vilant (TRELEGY ELLIPTA) 100-62.5-25 MCG/INH AEPB Inhale 1 puff into the lungs daily.  . furosemide (LASIX) 40 MG tablet TAKE 1/2 TABLET(20 MG) BY MOUTH DAILY AS NEEDED FOR FLUID RETENTION OR SWELLING  . metoprolol succinate (TOPROL-XL) 25 MG 24 hr tablet Take 1 tablet (25 mg total) by mouth daily.  . nitroGLYCERIN (NITROSTAT) 0.4 MG SL tablet Place 1 tablet (0.4 mg total) under the tongue every 5 (five) minutes as needed for chest pain (x 3 tabs).  Marland Kitchen omeprazole (PRILOSEC) 20 MG capsule Take 1 capsule (20 mg total) by mouth daily.  Marland Kitchen Spacer/Aero-Holding Chambers (AEROCHAMBER MV) inhaler Use as instructed  . traMADol (ULTRAM) 50 MG tablet TAKE 1 TABLET BY MOUTH EVERY 6 HOURS AS NEEDED FOR PAIN  . vitamin B-12 (CYANOCOBALAMIN) 1000 MCG tablet Take 1 tablet (1,000 mcg total) by mouth daily.  . [DISCONTINUED] amiodarone (PACERONE) 200 MG tablet TAKE 1 TABLET BY MOUTH TWICE DAILY     Allergies:   Baclofen, Codeine phosphate, Hydrochlorothiazide, Montelukast sodium, and Norco [hydrocodone-acetaminophen]   Social History   Socioeconomic History  . Marital status: Widowed    Spouse name: Not on file  . Number of children: 4  . Years of education: Not on file  . Highest education  level: Not on file  Occupational History  . 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 Financial resource strain: Not on file  . Food insecurity    Worry: Not on file    Inability: Not on file  . Transportation needs    Medical: Not on file    Non-medical: Not on file  Tobacco Use  . Smoking status: Former Smoker    Packs/day: 1.00    Types: Cigarettes    Quit date: 03/02/2009    Years since quitting: 9.8  . Smokeless tobacco: Never Used  Substance and Sexual Activity  . Alcohol use: Yes    Alcohol/week: 13.0 standard drinks    Types: 7 Glasses of wine, 6 Standard drinks or equivalent per week    Comment: 5 oz white wine per day  . Drug use: No  . Sexual activity: Never  Lifestyle  . Physical activity    Days per week: Not on file    Minutes per session: Not on file  . Stress: Not on file  Relationships  . Social Herbalist on phone: Not on file    Gets together: Not on file    Attends religious service: Not on file    Active member of club or organization: Not on file    Attends meetings of clubs or organizations: Not on file    Relationship status: Not on file  Other Topics Concern  . 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    Semi-retired Futures trader.     Pt has home nebulizer for inhalation therapies.   Former Smoker   Smoking  Status:  quit > 5 years ago      (+) DNR status per discussion with Dr McDiarmid 10/25/12 and reiterated 06/20/13 office visit .   (+) Living Will/Advance Directive   (+) HC-POA: Cecile Sheerer Causey (pt's dgt)      Current Social History 09/22/2016        Patient lives with daughter, [name] (flight attendant), in two level home 09/22/2016   Transportation: Patient has own vehicle 09/22/2016   Important  Relationships 4 daughters and 110 yo grandson 09/22/2016    Pets: None 09/22/2016   Education / Work:  College/ Retired Futures trader 09/22/2016   Interests / Fun: Reads 2 books per week, watches TV, entertains 09/22/2016   Current Stressors: 85 yo house in need of outside repairs 09/22/2016   Religious / Personal Beliefs: Joanie Coddington 09/22/2016   Other: Had a wonderful marriage/ Very thankful/ wants to continue traveling/ closest friends are all gone 09/22/2016   L. Ducatte, RN, BSN                                                                                                    Family History: The patient's family history includes Dementia in her mother; Heart attack in her father; Heart disease in her father.  ROS:   Please see the history of present illness.    Weight and appetite have been stable.  Some decrease in memory.  She points this out.  Some sadness and mood change related to the COVID-19 pandemic.  One of her daughters is staying with her now until she goes back to work.  All other systems reviewed and are negative.  EKGs/Labs/Other Studies Reviewed:    The following studies were reviewed today: No new functional data  EKG:  EKG performed May 10, 2018 demonstrates atrial sensed ventricular paced rhythm.  Recent Labs: 08/28/2018: BNP 497.3; TSH 4.040 11/29/2018: ALT 38; BUN 29; Creatinine, Ser 1.54; Hemoglobin 10.5; Platelets 235; Potassium 4.5; Sodium 142  Recent Lipid Panel    Component Value Date/Time   CHOL 130 08/11/2016 1101   TRIG 106 08/11/2016 1101   HDL 37 (L) 08/11/2016 1101   CHOLHDL 3.5 08/11/2016 1101   CHOLHDL 3.4 07/30/2015 1231   VLDL 29 07/30/2015 1231   LDLCALC 72 08/11/2016 1101   LDLDIRECT 59 11/29/2018 1310   LDLDIRECT 74 05/08/2014 1110    Physical Exam:    VS:  BP 122/70   Pulse 100   Ht 5\' 5"  (1.651 m)   Wt 176 lb 14.2 oz (80.2 kg)   SpO2 97%   BMI 29.44 kg/m     Wt Readings from Last 3 Encounters:  12/27/18 176 lb 14.2 oz  (80.2 kg)  12/11/18 175 lb (79.4 kg)  11/29/18 174 lb (78.9 kg)     GEN: Elderly and age-appropriate in appearance.. No acute distress HEENT: Normal NECK: No JVD. LYMPHATICS: No lymphadenopathy CARDIAC:  RRR without murmur, gallop, or edema. VASCULAR:  Normal Pulses. No bruits. RESPIRATORY: Some rhonchi and expiratory wheezing to auscultation without rales, or rhonchi  ABDOMEN: Soft, non-tender, non-distended, No pulsatile mass,  MUSCULOSKELETAL: No deformity  SKIN: Warm and dry NEUROLOGIC:  Alert and oriented x 3 PSYCHIATRIC:  Normal affect   ASSESSMENT:    1. CAD S/P RCA BMS, residual CFX disease   2. Chronic diastolic CHF (congestive heart failure) ([place])   3. Chronic kidney disease (CKD), stage IV (severe) ([place])   4. Chronic anticoagulation   5. Pacemaker   6. Paroxysmal atrial fibrillation (HCC)   7. On amiodarone therapy   8. Educated about COVID-19 virus infection    PLAN:    In order of problems listed above:  1. No angina.  Secondary prevention is discussed. 2. No evidence of excess volume on exam.  A. fib related acute decompensation does occur. 3. Stage IV chronic kidney disease, stable, with estimated GFR 30 cc/min 4. Tolerating Eliquis without complications 5. Normal function when last evaluated 6. Sinus rhythm being maintained on amiodarone. 7. Medication list within the electronic health record suggests 200 mg twice daily but the patient assures me that she is taking 1 tablet/day.  When she returns in 4 months a TSH, liver panel, and possibly chest x-ray will be performed. 8. 3 W's are  being practiced.   Medication Adjustments/Labs and Tests Ordered: Current medicines are reviewed at length with the patient today.  Concerns regarding medicines are outlined above.  No orders of the defined types were placed in this encounter.  Meds ordered this encounter  Medications  . amiodarone (PACERONE) 200 MG tablet    Sig: Take 1 tablet (200 mg total) by mouth  daily.    Dispense:  90 tablet    Refill:  3    Patient Instructions  Medication Instructions:  1) Make sure you are only taking Amiodarone 200mg  once daily  *If you need a refill on your cardiac medications before your next appointment, please call your pharmacy*  Lab Work: None If you have labs (blood work) drawn today and your tests are completely normal, you will receive your results only by: Marland Kitchen MyChart Message (if you have MyChart) OR . A paper copy in the mail If you have any lab test that is abnormal or we need to change your treatment, we will call you to review the results.  Testing/Procedures: None  Follow-Up: At [facility], you and your health needs are our priority.  As part of our continuing mission to provide you with exceptional heart care, we have created designated Provider Care Teams.  These Care Teams include your primary Cardiologist (physician) and Advanced Practice Providers (APPs -  Physician Assistants and Nurse Practitioners) who all work together to provide you with the care you need, when you need it.  Your next appointment:   6 months  The format for your next appointment:   In Person  Provider:   You may see Sinclair Grooms, MD or one of the following Advanced Practice Providers on your designated Care Team:    Truitt Merle, NP  Cecilie Kicks, NP  Kathyrn Drown, NP   Other Instructions      Signed, Sinclair Grooms, MD  12/27/2018 12:58 PM    [place]

## 2018-12-27 ENCOUNTER — Other Ambulatory Visit: Payer: Self-pay

## 2018-12-27 ENCOUNTER — Encounter: Payer: Self-pay | Admitting: Interventional Cardiology

## 2018-12-27 ENCOUNTER — Ambulatory Visit (INDEPENDENT_AMBULATORY_CARE_PROVIDER_SITE_OTHER): Payer: Medicare Other | Admitting: Interventional Cardiology

## 2018-12-27 VITALS — BP 122/70 | HR 100 | Ht 65.0 in | Wt 176.9 lb

## 2018-12-27 DIAGNOSIS — Z9861 Coronary angioplasty status: Secondary | ICD-10-CM

## 2018-12-27 DIAGNOSIS — Z950 Presence of cardiac pacemaker: Secondary | ICD-10-CM

## 2018-12-27 DIAGNOSIS — I2510 Atherosclerotic heart disease of native coronary artery without angina pectoris: Secondary | ICD-10-CM

## 2018-12-27 DIAGNOSIS — Z79899 Other long term (current) drug therapy: Secondary | ICD-10-CM | POA: Diagnosis not present

## 2018-12-27 DIAGNOSIS — N184 Chronic kidney disease, stage 4 (severe): Secondary | ICD-10-CM

## 2018-12-27 DIAGNOSIS — Z7901 Long term (current) use of anticoagulants: Secondary | ICD-10-CM

## 2018-12-27 DIAGNOSIS — I5032 Chronic diastolic (congestive) heart failure: Secondary | ICD-10-CM | POA: Diagnosis not present

## 2018-12-27 DIAGNOSIS — Z7189 Other specified counseling: Secondary | ICD-10-CM | POA: Diagnosis not present

## 2018-12-27 DIAGNOSIS — I480 Paroxysmal atrial fibrillation: Secondary | ICD-10-CM | POA: Diagnosis not present

## 2018-12-27 MED ORDER — AMIODARONE HCL 200 MG PO TABS: 200.0000 mg | ORAL_TABLET | Freq: Every day | ORAL | 3 refills | Status: DC

## 2018-12-27 NOTE — Patient Instructions (Signed)
Medication Instructions:  1) Make sure you are only taking Amiodarone 200mg  once daily  *If you need a refill on your cardiac medications before your next appointment, please call your pharmacy*  Lab Work: None If you have labs (blood work) drawn today and your tests are completely normal, you will receive your results only by: Marland Kitchen MyChart Message (if you have MyChart) OR . A paper copy in the mail If you have any lab test that is abnormal or we need to change your treatment, we will call you to review the results.  Testing/Procedures: None  Follow-Up: At [facility], you and your health needs are our priority.  As part of our continuing mission to provide you with exceptional heart care, we have created designated Provider Care Teams.  These Care Teams include your primary Cardiologist (physician) and Advanced Practice Providers (APPs -  Physician Assistants and Nurse Practitioners) who all work together to provide you with the care you need, when you need it.  Your next appointment:   6 months  The format for your next appointment:   In Person  Provider:   You may see Sinclair Grooms, MD or one of the following Advanced Practice Providers on your designated Care Team:    Truitt Merle, NP  Cecilie Kicks, NP  Kathyrn Drown, NP   Other Instructions

## 2019-01-19 ENCOUNTER — Other Ambulatory Visit: Payer: Self-pay | Admitting: Family Medicine

## 2019-01-23 DIAGNOSIS — H353231 Exudative age-related macular degeneration, bilateral, with active choroidal neovascularization: Secondary | ICD-10-CM | POA: Diagnosis not present

## 2019-01-31 DIAGNOSIS — M79671 Pain in right foot: Secondary | ICD-10-CM | POA: Diagnosis not present

## 2019-02-13 ENCOUNTER — Other Ambulatory Visit: Payer: Self-pay | Admitting: Family Medicine

## 2019-02-14 ENCOUNTER — Encounter: Payer: Self-pay | Admitting: Family Medicine

## 2019-03-18 ENCOUNTER — Other Ambulatory Visit: Payer: Self-pay | Admitting: Family Medicine

## 2019-03-18 DIAGNOSIS — E7800 Pure hypercholesterolemia, unspecified: Secondary | ICD-10-CM

## 2019-03-18 DIAGNOSIS — Z9189 Other specified personal risk factors, not elsewhere classified: Secondary | ICD-10-CM

## 2019-03-18 DIAGNOSIS — I252 Old myocardial infarction: Secondary | ICD-10-CM

## 2019-03-18 DIAGNOSIS — I2510 Atherosclerotic heart disease of native coronary artery without angina pectoris: Secondary | ICD-10-CM

## 2019-03-19 ENCOUNTER — Other Ambulatory Visit: Payer: Self-pay

## 2019-03-19 ENCOUNTER — Encounter: Payer: Self-pay | Admitting: Family Medicine

## 2019-03-19 ENCOUNTER — Ambulatory Visit (INDEPENDENT_AMBULATORY_CARE_PROVIDER_SITE_OTHER): Payer: Medicare Other | Admitting: Family Medicine

## 2019-03-19 VITALS — BP 110/80

## 2019-03-19 DIAGNOSIS — I2510 Atherosclerotic heart disease of native coronary artery without angina pectoris: Secondary | ICD-10-CM | POA: Diagnosis not present

## 2019-03-19 DIAGNOSIS — Z9181 History of falling: Secondary | ICD-10-CM | POA: Insufficient documentation

## 2019-03-19 DIAGNOSIS — N3090 Cystitis, unspecified without hematuria: Secondary | ICD-10-CM

## 2019-03-19 DIAGNOSIS — N184 Chronic kidney disease, stage 4 (severe): Secondary | ICD-10-CM | POA: Diagnosis not present

## 2019-03-19 DIAGNOSIS — N3000 Acute cystitis without hematuria: Secondary | ICD-10-CM | POA: Diagnosis not present

## 2019-03-19 HISTORY — DX: Cystitis, unspecified without hematuria: N30.90

## 2019-03-19 LAB — POCT URINALYSIS DIP (MANUAL ENTRY)
Blood, UA: NEGATIVE
Glucose, UA: NEGATIVE mg/dL
Ketones, POC UA: NEGATIVE mg/dL
Nitrite, UA: NEGATIVE
Protein Ur, POC: NEGATIVE mg/dL
Spec Grav, UA: 1.02 (ref 1.010–1.025)
Urobilinogen, UA: 1 E.U./dL
pH, UA: 5.5 (ref 5.0–8.0)

## 2019-03-19 LAB — POCT UA - MICROSCOPIC ONLY

## 2019-03-19 MED ORDER — CEPHALEXIN 500 MG PO CAPS: 500.0000 mg | ORAL_CAPSULE | Freq: Two times a day (BID) | ORAL | 0 refills | Status: DC

## 2019-03-19 NOTE — Assessment & Plan Note (Signed)
Unwitnessed.  No clear evidence of vertigo contributing.  Orthostatic blood pressures are normal today.  It is unclear if this fall was due to imbalance, weakness or other reason but [name] appears to have had issues with left-sided weakness in the past.  No grossly abnormal strength on exam today in clinic but it may be helpful to have her see physical therapy for strengthening exercises. -Ambulatory referral to physical therapy placed

## 2019-03-19 NOTE — Patient Instructions (Signed)
Recent fall: I'm not sure why you had this fall.  Let's at least get you some strengthening exercises with physical therapy.    Urinary frequency:  It looks like you have a bladder infection.  We can treat with 3 days of keflex 500 mg.  We will let you know if your blood work shows anything remarkable.

## 2019-03-19 NOTE — Assessment & Plan Note (Signed)
Mrs. Gartland reports symptoms of polyuria plus/minus urinary urgency.  No signs of systemic symptoms (fever, nausea, vomiting) UA positive for leukocytes with few leukocytes seen on microscopy.  Will treat with a 3-day course of Keflex.  She is concerned about her one remaining kidney will follow up with a BMP to assess kidney function. -Keflex 500 twice daily for 3 days -Follow-up BMP

## 2019-03-19 NOTE — Progress Notes (Signed)
Subjective:  [name] is a 86 y.o. female who presents to the [facility] today with a chief complaint of frequent urination.   HPI:  Cystitis, uncomplicated [name] is here accompanied by her daughter.  Her daughter reports that 2 nights ago she had to get up to go to the bathroom 10 times during the night.  Last night, she seemed to have the opposite problem and only urinated twice.  She is not reporting any other symptoms right now.  She is concerned because she only has 1 kidney and wants to make sure she is taking care of it.  She denies fever, nausea, vomiting, low back pain, suprapubic pain, dysuria.  Recent fall at home, unwitnessed [name] reports that she fell at home between 10 and 14 days ago.  She does not clearly remember the fall but she believes she fell because she suddenly felt like she lost her balance.  She remembers that she fell on her right arm and did not hit her head.  She was able to maintain consciousness and got up on her own without assistance.  She later told her children that she had fallen.  She is not currently experiencing any aches or bruising.  She has no pain in her right arm or leg.  Her daughter notes that she has had some left-sided leg weakness previously.  She denies lightheadedness or dizziness with standing.  She denies dizziness in general.    Objective:  Physical Exam: BP 110/80    Orthostatic VS for the past 24 hrs:  BP- Lying BP- Sitting BP- Standing at 0 minutes  03/19/19 1203 122/78 120/80 110/70    General: No acute distress, resting comfortably in chair in the exam room.  Able to stand up and walk to the door of the exam room and back unassisted. Cardiac: Regular rate, no murmurs/rubs/gallops Respiratory: Breathing comfortably on room air.  Normal respiratory effort. Lower extremities: 5/5 strength for hip flexion, knee flexion, knee extension.  Brisk gait with small steps, slightly favoring the right side.  Results for orders placed  or performed in visit on 03/19/19 (from the past 72 hour(s))  POCT urinalysis dipstick     Status: Abnormal   Collection Time: 03/19/19 12:00 PM  Result Value Ref Range   Color, UA yellow yellow   Clarity, UA clear clear   Glucose, UA negative negative mg/dL   Bilirubin, UA small (A) negative   Ketones, POC UA negative negative mg/dL   Spec Grav, UA 1.020 1.010 - 1.025   Blood, UA negative negative   pH, UA 5.5 5.0 - 8.0   Protein Ur, POC negative negative mg/dL   Urobilinogen, UA 1.0 0.2 or 1.0 E.U./dL   Nitrite, UA Negative Negative   Leukocytes, UA Trace (A) Negative  POCT UA - Microscopic Only     Status: Abnormal   Collection Time: 03/19/19 12:00 PM  Result Value Ref Range   WBC, Ur, HPF, POC 0-3    RBC, urine, microscopic NONE    Bacteria, U Microscopic 2+    Epithelial cells, urine per micros 10-15    Casts, Ur, LPF, POC 0-3 HYALINE    *Note: Due to a large number of results and/or encounters for the requested time period, some results have not been displayed. A complete set of results can be found in Results Review.     Assessment/Plan:  Cystitis [name] reports symptoms of polyuria plus/minus urinary urgency.  No signs of systemic symptoms (  fever, nausea, vomiting) UA positive for leukocytes with few leukocytes seen on microscopy.  Will treat with a 3-day course of Keflex.  She is concerned about her one remaining kidney will follow up with a BMP to assess kidney function. -Keflex 500 twice daily for 3 days -Follow-up BMP  History of recent fall Unwitnessed.  No clear evidence of vertigo contributing.  Orthostatic blood pressures are normal today.  It is unclear if this fall was due to imbalance, weakness or other reason but [name] appears to have had issues with left-sided weakness in the past.  No grossly abnormal strength on exam today in clinic but it may be helpful to have her see physical therapy for strengthening exercises. -Ambulatory referral to physical  therapy placed

## 2019-03-20 ENCOUNTER — Ambulatory Visit: Payer: Medicare Other

## 2019-03-20 LAB — BASIC METABOLIC PANEL
BUN/Creatinine Ratio: 19 (ref 12–28)
BUN: 41 mg/dL — ABNORMAL HIGH (ref 10–36)
CO2: 25 mmol/L (ref 20–29)
Calcium: 9.1 mg/dL (ref 8.7–10.3)
Chloride: 101 mmol/L (ref 96–106)
Creatinine, Ser: 2.16 mg/dL — ABNORMAL HIGH (ref 0.57–1.00)
GFR calc Af Amer: 22 mL/min/{1.73_m2} — ABNORMAL LOW (ref >59)
GFR calc non Af Amer: 19 mL/min/{1.73_m2} — ABNORMAL LOW (ref >59)
Glucose: 93 mg/dL (ref 65–99)
Potassium: 4.7 mmol/L (ref 3.5–5.2)
Sodium: 142 mmol/L (ref 134–144)

## 2019-03-21 LAB — URINE CULTURE

## 2019-03-22 ENCOUNTER — Other Ambulatory Visit: Payer: Self-pay | Admitting: Family Medicine

## 2019-03-22 DIAGNOSIS — N184 Chronic kidney disease, stage 4 (severe): Secondary | ICD-10-CM

## 2019-03-22 NOTE — Progress Notes (Signed)
Pt called and informed that kidney function is a little worse.  Told to stop lasix for now and drink more water during the day.  Will follow up next week for repeat BMP.  Matilde Haymaker, MD

## 2019-03-25 ENCOUNTER — Other Ambulatory Visit: Payer: Medicare Other

## 2019-03-27 ENCOUNTER — Other Ambulatory Visit: Payer: Self-pay | Admitting: Family Medicine

## 2019-03-27 DIAGNOSIS — M48061 Spinal stenosis, lumbar region without neurogenic claudication: Secondary | ICD-10-CM

## 2019-03-29 ENCOUNTER — Other Ambulatory Visit: Payer: Self-pay

## 2019-03-29 ENCOUNTER — Other Ambulatory Visit: Payer: Medicare Other

## 2019-03-29 ENCOUNTER — Ambulatory Visit: Payer: Medicare Other | Admitting: Physical Therapy

## 2019-03-29 DIAGNOSIS — N184 Chronic kidney disease, stage 4 (severe): Secondary | ICD-10-CM

## 2019-04-01 ENCOUNTER — Telehealth: Payer: Self-pay | Admitting: Family Medicine

## 2019-04-01 DIAGNOSIS — I4891 Unspecified atrial fibrillation: Secondary | ICD-10-CM | POA: Diagnosis not present

## 2019-04-01 DIAGNOSIS — N184 Chronic kidney disease, stage 4 (severe): Secondary | ICD-10-CM | POA: Diagnosis not present

## 2019-04-01 DIAGNOSIS — Z905 Acquired absence of kidney: Secondary | ICD-10-CM | POA: Diagnosis not present

## 2019-04-01 DIAGNOSIS — H3530 Unspecified macular degeneration: Secondary | ICD-10-CM | POA: Diagnosis not present

## 2019-04-01 DIAGNOSIS — Z7951 Long term (current) use of inhaled steroids: Secondary | ICD-10-CM | POA: Diagnosis not present

## 2019-04-01 DIAGNOSIS — Z950 Presence of cardiac pacemaker: Secondary | ICD-10-CM | POA: Diagnosis not present

## 2019-04-01 DIAGNOSIS — E538 Deficiency of other specified B group vitamins: Secondary | ICD-10-CM | POA: Diagnosis not present

## 2019-04-01 DIAGNOSIS — F0280 Dementia in other diseases classified elsewhere without behavioral disturbance: Secondary | ICD-10-CM | POA: Diagnosis not present

## 2019-04-01 DIAGNOSIS — R531 Weakness: Secondary | ICD-10-CM | POA: Diagnosis not present

## 2019-04-01 DIAGNOSIS — Z9181 History of falling: Secondary | ICD-10-CM | POA: Diagnosis not present

## 2019-04-01 DIAGNOSIS — J449 Chronic obstructive pulmonary disease, unspecified: Secondary | ICD-10-CM | POA: Diagnosis not present

## 2019-04-01 DIAGNOSIS — R600 Localized edema: Secondary | ICD-10-CM

## 2019-04-01 DIAGNOSIS — Z7901 Long term (current) use of anticoagulants: Secondary | ICD-10-CM | POA: Diagnosis not present

## 2019-04-01 DIAGNOSIS — M4316 Spondylolisthesis, lumbar region: Secondary | ICD-10-CM | POA: Diagnosis not present

## 2019-04-01 DIAGNOSIS — M48061 Spinal stenosis, lumbar region without neurogenic claudication: Secondary | ICD-10-CM | POA: Diagnosis not present

## 2019-04-01 DIAGNOSIS — I2510 Atherosclerotic heart disease of native coronary artery without angina pectoris: Secondary | ICD-10-CM | POA: Diagnosis not present

## 2019-04-01 DIAGNOSIS — N3000 Acute cystitis without hematuria: Secondary | ICD-10-CM | POA: Diagnosis not present

## 2019-04-01 DIAGNOSIS — I5032 Chronic diastolic (congestive) heart failure: Secondary | ICD-10-CM | POA: Diagnosis not present

## 2019-04-01 NOTE — Telephone Encounter (Signed)
Shanon Brow from [facility] is calling and would like verbal orders to continue PT.  2 times a for 6 weeks 1 time a week 3 weeks  Shanon Brow states that pt is in need of bedside commode. He said that [facility] is close to pt.     The best call back number is  [phone]

## 2019-04-02 NOTE — Telephone Encounter (Signed)
Please call verbal order to  Shanon Brow from [facility] to continue PT.   2 times a for 6 weeks 1 time a week 3 weeks   Please call in verbal order for bedside commode to Shanon Brow

## 2019-04-02 NOTE — Telephone Encounter (Signed)
Verbal orders given to [facility].  DME order printed, signed and faxed to [facility].  Tynisha Ogan,CMA

## 2019-04-03 DIAGNOSIS — H35033 Hypertensive retinopathy, bilateral: Secondary | ICD-10-CM | POA: Diagnosis not present

## 2019-04-03 DIAGNOSIS — H35371 Puckering of macula, right eye: Secondary | ICD-10-CM | POA: Diagnosis not present

## 2019-04-03 DIAGNOSIS — H353231 Exudative age-related macular degeneration, bilateral, with active choroidal neovascularization: Secondary | ICD-10-CM | POA: Diagnosis not present

## 2019-04-05 DIAGNOSIS — N3000 Acute cystitis without hematuria: Secondary | ICD-10-CM | POA: Diagnosis not present

## 2019-04-05 DIAGNOSIS — J449 Chronic obstructive pulmonary disease, unspecified: Secondary | ICD-10-CM | POA: Diagnosis not present

## 2019-04-05 DIAGNOSIS — N184 Chronic kidney disease, stage 4 (severe): Secondary | ICD-10-CM | POA: Diagnosis not present

## 2019-04-05 DIAGNOSIS — M48061 Spinal stenosis, lumbar region without neurogenic claudication: Secondary | ICD-10-CM | POA: Diagnosis not present

## 2019-04-05 DIAGNOSIS — M4316 Spondylolisthesis, lumbar region: Secondary | ICD-10-CM | POA: Diagnosis not present

## 2019-04-05 DIAGNOSIS — R531 Weakness: Secondary | ICD-10-CM | POA: Diagnosis not present

## 2019-04-08 DIAGNOSIS — N184 Chronic kidney disease, stage 4 (severe): Secondary | ICD-10-CM | POA: Diagnosis not present

## 2019-04-08 DIAGNOSIS — M4316 Spondylolisthesis, lumbar region: Secondary | ICD-10-CM | POA: Diagnosis not present

## 2019-04-08 DIAGNOSIS — N3000 Acute cystitis without hematuria: Secondary | ICD-10-CM | POA: Diagnosis not present

## 2019-04-08 DIAGNOSIS — M48061 Spinal stenosis, lumbar region without neurogenic claudication: Secondary | ICD-10-CM | POA: Diagnosis not present

## 2019-04-08 DIAGNOSIS — R531 Weakness: Secondary | ICD-10-CM | POA: Diagnosis not present

## 2019-04-08 DIAGNOSIS — J449 Chronic obstructive pulmonary disease, unspecified: Secondary | ICD-10-CM | POA: Diagnosis not present

## 2019-04-10 DIAGNOSIS — J449 Chronic obstructive pulmonary disease, unspecified: Secondary | ICD-10-CM | POA: Diagnosis not present

## 2019-04-10 DIAGNOSIS — M48061 Spinal stenosis, lumbar region without neurogenic claudication: Secondary | ICD-10-CM | POA: Diagnosis not present

## 2019-04-10 DIAGNOSIS — N3000 Acute cystitis without hematuria: Secondary | ICD-10-CM | POA: Diagnosis not present

## 2019-04-10 DIAGNOSIS — N184 Chronic kidney disease, stage 4 (severe): Secondary | ICD-10-CM | POA: Diagnosis not present

## 2019-04-10 DIAGNOSIS — M4316 Spondylolisthesis, lumbar region: Secondary | ICD-10-CM | POA: Diagnosis not present

## 2019-04-10 DIAGNOSIS — R531 Weakness: Secondary | ICD-10-CM | POA: Diagnosis not present

## 2019-04-15 DIAGNOSIS — N184 Chronic kidney disease, stage 4 (severe): Secondary | ICD-10-CM | POA: Diagnosis not present

## 2019-04-15 DIAGNOSIS — J449 Chronic obstructive pulmonary disease, unspecified: Secondary | ICD-10-CM | POA: Diagnosis not present

## 2019-04-15 DIAGNOSIS — M48061 Spinal stenosis, lumbar region without neurogenic claudication: Secondary | ICD-10-CM | POA: Diagnosis not present

## 2019-04-15 DIAGNOSIS — N3000 Acute cystitis without hematuria: Secondary | ICD-10-CM | POA: Diagnosis not present

## 2019-04-15 DIAGNOSIS — R531 Weakness: Secondary | ICD-10-CM | POA: Diagnosis not present

## 2019-04-15 DIAGNOSIS — M4316 Spondylolisthesis, lumbar region: Secondary | ICD-10-CM | POA: Diagnosis not present

## 2019-04-18 DIAGNOSIS — M48061 Spinal stenosis, lumbar region without neurogenic claudication: Secondary | ICD-10-CM | POA: Diagnosis not present

## 2019-04-18 DIAGNOSIS — N3000 Acute cystitis without hematuria: Secondary | ICD-10-CM | POA: Diagnosis not present

## 2019-04-18 DIAGNOSIS — N184 Chronic kidney disease, stage 4 (severe): Secondary | ICD-10-CM | POA: Diagnosis not present

## 2019-04-18 DIAGNOSIS — J449 Chronic obstructive pulmonary disease, unspecified: Secondary | ICD-10-CM | POA: Diagnosis not present

## 2019-04-18 DIAGNOSIS — M4316 Spondylolisthesis, lumbar region: Secondary | ICD-10-CM | POA: Diagnosis not present

## 2019-04-18 DIAGNOSIS — R531 Weakness: Secondary | ICD-10-CM | POA: Diagnosis not present

## 2019-04-24 DIAGNOSIS — M4316 Spondylolisthesis, lumbar region: Secondary | ICD-10-CM | POA: Diagnosis not present

## 2019-04-24 DIAGNOSIS — N3000 Acute cystitis without hematuria: Secondary | ICD-10-CM | POA: Diagnosis not present

## 2019-04-24 DIAGNOSIS — M48061 Spinal stenosis, lumbar region without neurogenic claudication: Secondary | ICD-10-CM | POA: Diagnosis not present

## 2019-04-24 DIAGNOSIS — R531 Weakness: Secondary | ICD-10-CM | POA: Diagnosis not present

## 2019-04-24 DIAGNOSIS — J449 Chronic obstructive pulmonary disease, unspecified: Secondary | ICD-10-CM | POA: Diagnosis not present

## 2019-04-24 DIAGNOSIS — N184 Chronic kidney disease, stage 4 (severe): Secondary | ICD-10-CM | POA: Diagnosis not present

## 2019-04-26 DIAGNOSIS — N3000 Acute cystitis without hematuria: Secondary | ICD-10-CM | POA: Diagnosis not present

## 2019-04-26 DIAGNOSIS — M48061 Spinal stenosis, lumbar region without neurogenic claudication: Secondary | ICD-10-CM | POA: Diagnosis not present

## 2019-04-26 DIAGNOSIS — J449 Chronic obstructive pulmonary disease, unspecified: Secondary | ICD-10-CM | POA: Diagnosis not present

## 2019-04-26 DIAGNOSIS — N184 Chronic kidney disease, stage 4 (severe): Secondary | ICD-10-CM | POA: Diagnosis not present

## 2019-04-26 DIAGNOSIS — M4316 Spondylolisthesis, lumbar region: Secondary | ICD-10-CM | POA: Diagnosis not present

## 2019-04-26 DIAGNOSIS — R531 Weakness: Secondary | ICD-10-CM | POA: Diagnosis not present

## 2019-04-29 DIAGNOSIS — R531 Weakness: Secondary | ICD-10-CM | POA: Diagnosis not present

## 2019-04-29 DIAGNOSIS — M48061 Spinal stenosis, lumbar region without neurogenic claudication: Secondary | ICD-10-CM | POA: Diagnosis not present

## 2019-04-29 DIAGNOSIS — J449 Chronic obstructive pulmonary disease, unspecified: Secondary | ICD-10-CM | POA: Diagnosis not present

## 2019-04-29 DIAGNOSIS — N3000 Acute cystitis without hematuria: Secondary | ICD-10-CM | POA: Diagnosis not present

## 2019-04-29 DIAGNOSIS — M4316 Spondylolisthesis, lumbar region: Secondary | ICD-10-CM | POA: Diagnosis not present

## 2019-04-29 DIAGNOSIS — N184 Chronic kidney disease, stage 4 (severe): Secondary | ICD-10-CM | POA: Diagnosis not present

## 2019-05-01 DIAGNOSIS — E538 Deficiency of other specified B group vitamins: Secondary | ICD-10-CM | POA: Diagnosis not present

## 2019-05-01 DIAGNOSIS — F0280 Dementia in other diseases classified elsewhere without behavioral disturbance: Secondary | ICD-10-CM | POA: Diagnosis not present

## 2019-05-01 DIAGNOSIS — J449 Chronic obstructive pulmonary disease, unspecified: Secondary | ICD-10-CM | POA: Diagnosis not present

## 2019-05-01 DIAGNOSIS — R531 Weakness: Secondary | ICD-10-CM | POA: Diagnosis not present

## 2019-05-01 DIAGNOSIS — Z905 Acquired absence of kidney: Secondary | ICD-10-CM | POA: Diagnosis not present

## 2019-05-01 DIAGNOSIS — H3530 Unspecified macular degeneration: Secondary | ICD-10-CM | POA: Diagnosis not present

## 2019-05-01 DIAGNOSIS — M48061 Spinal stenosis, lumbar region without neurogenic claudication: Secondary | ICD-10-CM | POA: Diagnosis not present

## 2019-05-01 DIAGNOSIS — N184 Chronic kidney disease, stage 4 (severe): Secondary | ICD-10-CM | POA: Diagnosis not present

## 2019-05-01 DIAGNOSIS — Z7951 Long term (current) use of inhaled steroids: Secondary | ICD-10-CM | POA: Diagnosis not present

## 2019-05-01 DIAGNOSIS — Z950 Presence of cardiac pacemaker: Secondary | ICD-10-CM | POA: Diagnosis not present

## 2019-05-01 DIAGNOSIS — I5032 Chronic diastolic (congestive) heart failure: Secondary | ICD-10-CM | POA: Diagnosis not present

## 2019-05-01 DIAGNOSIS — M4316 Spondylolisthesis, lumbar region: Secondary | ICD-10-CM | POA: Diagnosis not present

## 2019-05-01 DIAGNOSIS — I2510 Atherosclerotic heart disease of native coronary artery without angina pectoris: Secondary | ICD-10-CM | POA: Diagnosis not present

## 2019-05-01 DIAGNOSIS — Z7901 Long term (current) use of anticoagulants: Secondary | ICD-10-CM | POA: Diagnosis not present

## 2019-05-01 DIAGNOSIS — I4891 Unspecified atrial fibrillation: Secondary | ICD-10-CM | POA: Diagnosis not present

## 2019-05-01 DIAGNOSIS — Z9181 History of falling: Secondary | ICD-10-CM | POA: Diagnosis not present

## 2019-05-01 DIAGNOSIS — N3000 Acute cystitis without hematuria: Secondary | ICD-10-CM | POA: Diagnosis not present

## 2019-05-02 ENCOUNTER — Telehealth: Payer: Self-pay | Admitting: Family Medicine

## 2019-05-02 DIAGNOSIS — M48061 Spinal stenosis, lumbar region without neurogenic claudication: Secondary | ICD-10-CM | POA: Diagnosis not present

## 2019-05-02 DIAGNOSIS — M4316 Spondylolisthesis, lumbar region: Secondary | ICD-10-CM | POA: Diagnosis not present

## 2019-05-02 DIAGNOSIS — N184 Chronic kidney disease, stage 4 (severe): Secondary | ICD-10-CM | POA: Diagnosis not present

## 2019-05-02 DIAGNOSIS — R531 Weakness: Secondary | ICD-10-CM | POA: Diagnosis not present

## 2019-05-02 DIAGNOSIS — J449 Chronic obstructive pulmonary disease, unspecified: Secondary | ICD-10-CM | POA: Diagnosis not present

## 2019-05-02 DIAGNOSIS — N3000 Acute cystitis without hematuria: Secondary | ICD-10-CM | POA: Diagnosis not present

## 2019-05-02 NOTE — Telephone Encounter (Signed)
Verbal orders left on voicemail for [employer].  Will forward to Md to make aware of this. Deroy Noah,CMA

## 2019-05-02 NOTE — Telephone Encounter (Signed)
Shanon Brow with [facility] called requesting verbal orders for adding skilled nursing evaluation to evaluate patient for severe bilateral lower extremity edema.  Patient on the verge of having skin breakdown. Please call Shanon Brow back at [phone] for these orders.

## 2019-05-06 DIAGNOSIS — N3000 Acute cystitis without hematuria: Secondary | ICD-10-CM | POA: Diagnosis not present

## 2019-05-06 DIAGNOSIS — N184 Chronic kidney disease, stage 4 (severe): Secondary | ICD-10-CM | POA: Diagnosis not present

## 2019-05-06 DIAGNOSIS — J449 Chronic obstructive pulmonary disease, unspecified: Secondary | ICD-10-CM | POA: Diagnosis not present

## 2019-05-06 DIAGNOSIS — R531 Weakness: Secondary | ICD-10-CM | POA: Diagnosis not present

## 2019-05-06 DIAGNOSIS — M48061 Spinal stenosis, lumbar region without neurogenic claudication: Secondary | ICD-10-CM | POA: Diagnosis not present

## 2019-05-06 DIAGNOSIS — M4316 Spondylolisthesis, lumbar region: Secondary | ICD-10-CM | POA: Diagnosis not present

## 2019-05-08 DIAGNOSIS — N184 Chronic kidney disease, stage 4 (severe): Secondary | ICD-10-CM | POA: Diagnosis not present

## 2019-05-08 DIAGNOSIS — J449 Chronic obstructive pulmonary disease, unspecified: Secondary | ICD-10-CM | POA: Diagnosis not present

## 2019-05-08 DIAGNOSIS — H353211 Exudative age-related macular degeneration, right eye, with active choroidal neovascularization: Secondary | ICD-10-CM | POA: Diagnosis not present

## 2019-05-08 DIAGNOSIS — M4316 Spondylolisthesis, lumbar region: Secondary | ICD-10-CM | POA: Diagnosis not present

## 2019-05-08 DIAGNOSIS — M48061 Spinal stenosis, lumbar region without neurogenic claudication: Secondary | ICD-10-CM | POA: Diagnosis not present

## 2019-05-08 DIAGNOSIS — H353231 Exudative age-related macular degeneration, bilateral, with active choroidal neovascularization: Secondary | ICD-10-CM | POA: Diagnosis not present

## 2019-05-08 DIAGNOSIS — N3000 Acute cystitis without hematuria: Secondary | ICD-10-CM | POA: Diagnosis not present

## 2019-05-08 DIAGNOSIS — R531 Weakness: Secondary | ICD-10-CM | POA: Diagnosis not present

## 2019-05-10 DIAGNOSIS — R531 Weakness: Secondary | ICD-10-CM | POA: Diagnosis not present

## 2019-05-10 DIAGNOSIS — N3000 Acute cystitis without hematuria: Secondary | ICD-10-CM | POA: Diagnosis not present

## 2019-05-10 DIAGNOSIS — M4316 Spondylolisthesis, lumbar region: Secondary | ICD-10-CM | POA: Diagnosis not present

## 2019-05-10 DIAGNOSIS — J449 Chronic obstructive pulmonary disease, unspecified: Secondary | ICD-10-CM | POA: Diagnosis not present

## 2019-05-10 DIAGNOSIS — M48061 Spinal stenosis, lumbar region without neurogenic claudication: Secondary | ICD-10-CM | POA: Diagnosis not present

## 2019-05-10 DIAGNOSIS — N184 Chronic kidney disease, stage 4 (severe): Secondary | ICD-10-CM | POA: Diagnosis not present

## 2019-05-13 DIAGNOSIS — N3000 Acute cystitis without hematuria: Secondary | ICD-10-CM | POA: Diagnosis not present

## 2019-05-13 DIAGNOSIS — M4316 Spondylolisthesis, lumbar region: Secondary | ICD-10-CM | POA: Diagnosis not present

## 2019-05-13 DIAGNOSIS — M48061 Spinal stenosis, lumbar region without neurogenic claudication: Secondary | ICD-10-CM | POA: Diagnosis not present

## 2019-05-13 DIAGNOSIS — N184 Chronic kidney disease, stage 4 (severe): Secondary | ICD-10-CM | POA: Diagnosis not present

## 2019-05-13 DIAGNOSIS — R531 Weakness: Secondary | ICD-10-CM | POA: Diagnosis not present

## 2019-05-13 DIAGNOSIS — J449 Chronic obstructive pulmonary disease, unspecified: Secondary | ICD-10-CM | POA: Diagnosis not present

## 2019-05-15 DIAGNOSIS — J449 Chronic obstructive pulmonary disease, unspecified: Secondary | ICD-10-CM | POA: Diagnosis not present

## 2019-05-15 DIAGNOSIS — M48061 Spinal stenosis, lumbar region without neurogenic claudication: Secondary | ICD-10-CM | POA: Diagnosis not present

## 2019-05-15 DIAGNOSIS — N184 Chronic kidney disease, stage 4 (severe): Secondary | ICD-10-CM | POA: Diagnosis not present

## 2019-05-15 DIAGNOSIS — N3000 Acute cystitis without hematuria: Secondary | ICD-10-CM | POA: Diagnosis not present

## 2019-05-15 DIAGNOSIS — R531 Weakness: Secondary | ICD-10-CM | POA: Diagnosis not present

## 2019-05-15 DIAGNOSIS — M4316 Spondylolisthesis, lumbar region: Secondary | ICD-10-CM | POA: Diagnosis not present

## 2019-05-16 ENCOUNTER — Other Ambulatory Visit: Payer: Self-pay

## 2019-05-16 ENCOUNTER — Ambulatory Visit: Payer: Medicare Other | Admitting: Family Medicine

## 2019-05-16 ENCOUNTER — Encounter: Payer: Self-pay | Admitting: Family Medicine

## 2019-05-16 ENCOUNTER — Ambulatory Visit (INDEPENDENT_AMBULATORY_CARE_PROVIDER_SITE_OTHER): Payer: Medicare Other | Admitting: Family Medicine

## 2019-05-16 VITALS — BP 126/84 | HR 87 | Ht 65.0 in | Wt 168.0 lb

## 2019-05-16 DIAGNOSIS — J449 Chronic obstructive pulmonary disease, unspecified: Secondary | ICD-10-CM

## 2019-05-16 DIAGNOSIS — D500 Iron deficiency anemia secondary to blood loss (chronic): Secondary | ICD-10-CM | POA: Diagnosis not present

## 2019-05-16 DIAGNOSIS — Z79899 Other long term (current) drug therapy: Secondary | ICD-10-CM | POA: Diagnosis not present

## 2019-05-16 DIAGNOSIS — R600 Localized edema: Secondary | ICD-10-CM

## 2019-05-16 DIAGNOSIS — I2510 Atherosclerotic heart disease of native coronary artery without angina pectoris: Secondary | ICD-10-CM | POA: Diagnosis not present

## 2019-05-16 NOTE — Progress Notes (Signed)
    SUBJECTIVE:   CHIEF COMPLAINT / HPI:   Lower Extremity Edema - Edema in legs extending to knees bilateraly. Endorses that edema progressively worsened. Took her compression stockings off for 2 days and swelling went down. Put the stockings back on this morning. Today her legs have pitting edema. L leg is more swollen than R - this is normal for her. No skin breaks. Skin over shin is dry and bruised. She is concerned about compression stocking being too tight.Moving around and is trying to walk without walker. Feels that R leg moves a little harder than the other.  SOB/COPD - Breathing has been good the last 3 days. Is SOB often. But the last 3 days has felt less SOB.    PERTINENT  PMH / PSH: LVH, Chronic diastolic CHF, Afib, bilateral leg edema  OBJECTIVE:   BP 126/84   Pulse 87   Wt 168 lb (76.2 kg)   SpO2 94%   BMI 27.96 kg/m   Physical Exam  Constitutional: She appears well-developed and well-nourished. No distress.  HENT:  Head: Normocephalic and atraumatic.  Cardiovascular: Intact distal pulses.  Musculoskeletal:        General: Edema present.  Skin: Skin is warm and dry. She is not diaphoretic.     ASSESSMENT/PLAN:   Bilateral leg edema Compression stocking are correctly fitted. Continue wearing them and monitor edema. Continue moving and walking, but counseled on always using walker to avoid falls. Checking creatinine to check kidney function.   History of Iron deficiency anemia due to chronic GI blood loss Check Hb  COPD, severe (HCC) Continue using the Albuterol prn     [place]

## 2019-05-16 NOTE — Assessment & Plan Note (Signed)
Compression stocking are correctly fitted. Continue wearing them and monitor edema. Continue moving and walking, but counseled on always using walker to avoid falls. Checking creatinine to check kidney function.

## 2019-05-16 NOTE — Assessment & Plan Note (Signed)
Continue using the Albuterol prn

## 2019-05-16 NOTE — Assessment & Plan Note (Signed)
Check Hb

## 2019-05-17 ENCOUNTER — Encounter: Payer: Self-pay | Admitting: Family Medicine

## 2019-05-17 DIAGNOSIS — N184 Chronic kidney disease, stage 4 (severe): Secondary | ICD-10-CM | POA: Diagnosis not present

## 2019-05-17 DIAGNOSIS — N3000 Acute cystitis without hematuria: Secondary | ICD-10-CM | POA: Diagnosis not present

## 2019-05-17 DIAGNOSIS — J449 Chronic obstructive pulmonary disease, unspecified: Secondary | ICD-10-CM | POA: Diagnosis not present

## 2019-05-17 DIAGNOSIS — R531 Weakness: Secondary | ICD-10-CM | POA: Diagnosis not present

## 2019-05-17 DIAGNOSIS — M4316 Spondylolisthesis, lumbar region: Secondary | ICD-10-CM | POA: Diagnosis not present

## 2019-05-17 DIAGNOSIS — M48061 Spinal stenosis, lumbar region without neurogenic claudication: Secondary | ICD-10-CM | POA: Diagnosis not present

## 2019-05-17 LAB — CMP14+EGFR
ALT: 35 IU/L — ABNORMAL HIGH (ref 0–32)
AST: 46 IU/L — ABNORMAL HIGH (ref 0–40)
Albumin/Globulin Ratio: 1.4 (ref 1.2–2.2)
Albumin: 3.6 g/dL (ref 3.5–4.6)
Alkaline Phosphatase: 102 IU/L (ref 39–117)
BUN/Creatinine Ratio: 14 (ref 12–28)
BUN: 23 mg/dL (ref 10–36)
Bilirubin Total: 0.6 mg/dL (ref 0.0–1.2)
CO2: 25 mmol/L (ref 20–29)
Calcium: 8.7 mg/dL (ref 8.7–10.3)
Chloride: 106 mmol/L (ref 96–106)
Creatinine, Ser: 1.7 mg/dL — ABNORMAL HIGH (ref 0.57–1.00)
GFR calc Af Amer: 30 mL/min/{1.73_m2} — ABNORMAL LOW (ref >59)
GFR calc non Af Amer: 26 mL/min/{1.73_m2} — ABNORMAL LOW (ref >59)
Globulin, Total: 2.6 g/dL (ref 1.5–4.5)
Glucose: 92 mg/dL (ref 65–99)
Potassium: 4.5 mmol/L (ref 3.5–5.2)
Sodium: 147 mmol/L — ABNORMAL HIGH (ref 134–144)
Total Protein: 6.2 g/dL (ref 6.0–8.5)

## 2019-05-17 LAB — CBC
Hematocrit: 35.9 % (ref 34.0–46.6)
Hemoglobin: 11 g/dL — ABNORMAL LOW (ref 11.1–15.9)
MCH: 29 pg (ref 26.6–33.0)
MCHC: 30.6 g/dL — ABNORMAL LOW (ref 31.5–35.7)
MCV: 95 fL (ref 79–97)
Platelets: 269 10*3/uL (ref 150–450)
RBC: 3.79 x10E6/uL (ref 3.77–5.28)
RDW: 13.9 % (ref 11.7–15.4)
WBC: 5.8 10*3/uL (ref 3.4–10.8)

## 2019-05-21 NOTE — Addendum Note (Signed)
Addended by: Lissa Morales D on: 05/21/2019 08:58 AM   Modules accepted: Level of Service

## 2019-05-23 ENCOUNTER — Ambulatory Visit: Payer: Medicare Other | Admitting: Family Medicine

## 2019-05-24 DIAGNOSIS — M4316 Spondylolisthesis, lumbar region: Secondary | ICD-10-CM | POA: Diagnosis not present

## 2019-05-24 DIAGNOSIS — N184 Chronic kidney disease, stage 4 (severe): Secondary | ICD-10-CM | POA: Diagnosis not present

## 2019-05-24 DIAGNOSIS — J449 Chronic obstructive pulmonary disease, unspecified: Secondary | ICD-10-CM | POA: Diagnosis not present

## 2019-05-24 DIAGNOSIS — R531 Weakness: Secondary | ICD-10-CM | POA: Diagnosis not present

## 2019-05-24 DIAGNOSIS — M48061 Spinal stenosis, lumbar region without neurogenic claudication: Secondary | ICD-10-CM | POA: Diagnosis not present

## 2019-05-24 DIAGNOSIS — N3000 Acute cystitis without hematuria: Secondary | ICD-10-CM | POA: Diagnosis not present

## 2019-05-29 DIAGNOSIS — J449 Chronic obstructive pulmonary disease, unspecified: Secondary | ICD-10-CM | POA: Diagnosis not present

## 2019-05-29 DIAGNOSIS — R531 Weakness: Secondary | ICD-10-CM | POA: Diagnosis not present

## 2019-05-29 DIAGNOSIS — M4316 Spondylolisthesis, lumbar region: Secondary | ICD-10-CM | POA: Diagnosis not present

## 2019-05-29 DIAGNOSIS — N184 Chronic kidney disease, stage 4 (severe): Secondary | ICD-10-CM | POA: Diagnosis not present

## 2019-05-29 DIAGNOSIS — M48061 Spinal stenosis, lumbar region without neurogenic claudication: Secondary | ICD-10-CM | POA: Diagnosis not present

## 2019-05-29 DIAGNOSIS — N3000 Acute cystitis without hematuria: Secondary | ICD-10-CM | POA: Diagnosis not present

## 2019-05-30 DIAGNOSIS — M48061 Spinal stenosis, lumbar region without neurogenic claudication: Secondary | ICD-10-CM | POA: Diagnosis not present

## 2019-05-30 DIAGNOSIS — J449 Chronic obstructive pulmonary disease, unspecified: Secondary | ICD-10-CM | POA: Diagnosis not present

## 2019-05-30 DIAGNOSIS — N184 Chronic kidney disease, stage 4 (severe): Secondary | ICD-10-CM | POA: Diagnosis not present

## 2019-05-30 DIAGNOSIS — R531 Weakness: Secondary | ICD-10-CM | POA: Diagnosis not present

## 2019-05-30 DIAGNOSIS — M4316 Spondylolisthesis, lumbar region: Secondary | ICD-10-CM | POA: Diagnosis not present

## 2019-05-30 DIAGNOSIS — N3000 Acute cystitis without hematuria: Secondary | ICD-10-CM | POA: Diagnosis not present

## 2019-06-04 ENCOUNTER — Other Ambulatory Visit: Payer: Self-pay | Admitting: Family Medicine

## 2019-06-10 DIAGNOSIS — H35033 Hypertensive retinopathy, bilateral: Secondary | ICD-10-CM | POA: Diagnosis not present

## 2019-06-10 DIAGNOSIS — H353231 Exudative age-related macular degeneration, bilateral, with active choroidal neovascularization: Secondary | ICD-10-CM | POA: Diagnosis not present

## 2019-06-10 DIAGNOSIS — H43813 Vitreous degeneration, bilateral: Secondary | ICD-10-CM | POA: Diagnosis not present

## 2019-06-10 DIAGNOSIS — H35371 Puckering of macula, right eye: Secondary | ICD-10-CM | POA: Diagnosis not present

## 2019-06-23 ENCOUNTER — Other Ambulatory Visit: Payer: Self-pay | Admitting: Internal Medicine

## 2019-06-24 ENCOUNTER — Other Ambulatory Visit: Payer: Self-pay | Admitting: Internal Medicine

## 2019-06-30 NOTE — Progress Notes (Signed)
Cardiology Office Note   Date:  07/02/2019   ID:  [name], DOB [birth year], MRN [id number]  PCP:  McDiarmid, Blane Ohara, MD  Cardiologist: Dr. [name]   Chief Complaint  Patient presents with  . Follow-up    History of Present Illness: [name] is a 86 y.o. female who presents for follow-up, seen for Dr. [name].  [name] has a history of RCA BM stent, paroxysmal atrial fibrillation with rhythm control and amiodarone therapy, chronic anticoagulation therapy, chronic diastolic heart failure, hypertension and PPM placement for tachybradycardia syndrome followed by Dr. Lovena Le.  Had decompensated acute diastolic heart failure 05/9447 secondary to persistent atrial fibrillation.  She was most recently seen by Dr. [name] 12/27/2018 in follow-up and was doing well from a CV standpoint.  [name] is a very pleasant 86 year old who is here alone and reports that she has been doing very well.  She has no specific complaints today including palpitations, chest pain, dizziness, orthopnea, shortness of breath, dizziness or syncope.  Does have some bilateral ankle swelling however she has not taken her PRN Lasix out of fear of kidney dysfunction.  We discussed she would be safe to take 2 days of Lasix 20 mg p.o. for LE edema.  We will draw lab work today occluding TSH.  Will also have follow-up CXR given amiodarone therapy.   Past Medical History:  Diagnosis Date  . Abnormal mammogram, unspecified 08/23/2010   Followup imaging reassuring.   . Acute appendicitis with rupture   . Acute on chronic diastolic heart failure ([place]) 10/16/2017  . ADENOMATOUS COLONIC POLYP 03/01/2003   Qualifier: Diagnosis of  By: McDiarmid MD, Sherren Mocha Multiple benign polyps of cecum, ascending, transverse and sigmoid colon by 8/09 colonoscopy by Dr Cristina Gong  Colonoscopy by Dr Cristina Gong for iron-deficiency anemia on 04/27/2010 showed three sessile polyps that were in ascending (3 mm x 9 mm), transverse (4 mm), and cecum (3 mm).  All  three were tubular adenomas that were negative for high grade dysplasia or malignancy on pathology. Dr Cristina Gong called the polpys benign and not requiring follow-up in view of the patients age.    . Adrenal adenoma    Incidentaloma  . AF (paroxysmal atrial fibrillation) ([place]) 11/11/2010   Hospitalization (9/8-9/10, Dr Daneen Schick, III, Cardiology) for Paroxysmal Atrial Fibrillation with RVR and anginal pain secondary to demand/supply mismatch in setting of RVR with known circumflex artery branch disease.    Marland Kitchen AKI (acute kidney injury) ([place])   . ANXIETY 04/27/2006   Qualifier: Diagnosis of  By: McDiarmid MD, Sherren Mocha    . Benign essential tremor 02/04/2016  . Candidiasis of the esophagus 11/26/2007  . CAP (community acquired pneumonia) 02/25/2015  . Cataract 2013   Bilateral   . Cellulitis of leg, right 10/01/2012  . Cellulitis of right lower extremity   . Chest pain with moderate risk of acute coronary syndrome 05/21/2015  . Chronic kidney disease (CKD), stage III (moderate)   . Concussion with loss of consciousness   . COPD 04/27/2006   Qualifier: Diagnosis of  By: McDiarmid MD, Sherren Mocha    . COPD exacerbation ([place])   . COPD, severe   . Decreased functional mobility and endurance 03/17/2015  . Dementia [facility]), possible Alzhemier's type 10/26/2012   Montreal Cognitive Assessment (25 out of 30, points off in visuospatial and and short-term memory).  Independent in iADLs and ADLs.    . Diastolic heart failure ([place]) 07/30/2015  . DISC WITH RADICULOPATHY 04/27/2006  Qualifier: Diagnosis of  By: McDiarmid MD, Sherren Mocha    . Dyspnea   . EDEMA-LEGS,DUE TO VENOUS OBSTRUCT. 04/27/2006   Qualifier: Diagnosis of  By: McDiarmid MD, Sherren Mocha    . Fall 10/27/2016  . Fracture of 5th metatarsal, Right, avulsion fx of tip styloid 11/09/2017  . GERD (gastroesophageal reflux disease) 11/08/2015  . 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 Normal uric acid  serum level at time of attack.    . Hand trauma, right, initial encounter 03/20/2018  . HERNIA, HIATAL, NONCONGENITAL 04/27/2006   Qualifier: Diagnosis of  By: McDiarmid MD, Sherren Mocha    . High risk medications (not anticoagulants) long-term use 03/05/2012  . History of Hemorrhoids 04/27/2006   Qualifier: Diagnosis of  By: McDiarmid MD, Sherren Mocha    . History of iron deficiency 01/16/2015  . History of Iron deficiency anemia due to chronic GI blood loss 08/05/2010   Dr Cristina Gong (GI) has evaluated with EGD, colonoscopy, and video capsular endoscopy in 2011 & 2012.  All have been unrevealing as to an origin of IDA.  OV with Dr Cristina Gong (10/28/10) assessment of blood in stool per hemoccult and GER. Hbg 12.1 g/dL, MCV 91.8, Ferritin 30 ng/mL. Patient taking on ferrous sulfate tab daily.   EGD on 03/06/12 by Dr Cristina Gong for IDA non-obstructing Schatzki's ring at [place]  . Hx of colonoscopy with polypectomy 04/27/2010   Dr Cristina Gong found three  tubular adenomas each less than 10 mm size  . Hypertension   . Hypotension 07/09/2015  . Impaired mobility and ADLs 09/01/2017  . Iron deficiency anemia 08/05/2010   Dr Cristina Gong (GI) has evaluated with EGD, colonoscopy, and video capsular endoscopy in 2011 & 2012.  All have been unrevealing as to an origin of IDA.  OV with Dr Cristina Gong (10/28/10) assessment of blood in stool per hemoccult and GER. Hbg 12.1 g/dL, MCV 91.8, Ferritin 30 ng/mL. Patient taking on ferrous sulfate tab daily.   EGD on 03/06/12 by Dr Cristina Gong for IDA non-obstructing Schatzki's ring at Gastroesophageal junction, otherwise normal esophagus and stomach.     . Laceration of arm, unspecified laterality, initial encounter 11/03/2017  . Left leg cellulitis   . Left Metatarsal fracture, 5th, avulsion styloid 12/29/2017  . Leg cramps 05/29/2012  . Low back pain   . Lumbar herniated disc    History of HNP L4/5 in 2003  . Macular degeneration, bilateral 10/04/2010   Right eye is wet MD, the other is dry macular degeneration (ARMD). Pt  undergoing some form of vascular endothelial growth factor inhibition intraocular therapy.    . Mild cognitive impairment 10/26/2012   (10/25/12) Failed MiniCog screen  . MUSCLE CRAMPS 03/11/2010   Qualifier: Diagnosis of  By: McDiarmid MD, Sherren Mocha    . Muscle spasm of back 09/24/2013  . Myocardial infarct, old   . Nocturia 10/28/2016  . Numbness and tingling in hands 07/21/2011  . Pacemaker    MDT  . Paroxysmal atrial fibrillation (HCC)   . Persistent atrial fibrillation ([place]) 05/31/2014  . PREDIABETES 09/11/2007   Qualifier: Diagnosis of  By: McDiarmid MD, Sherren Mocha    . Retinal hemorrhage of left eye 06/2010  . RHINITIS, ALLERGIC 04/27/2006   Qualifier: Diagnosis of  By: McDiarmid MD, Sherren Mocha    . SCHATZKI'S RING, HX OF 11/26/2007   Qualifier: Diagnosis of  By: McDiarmid MD, Sherren Mocha  An EGD was performed by Dr Cristina Gong on 04/27/2010 for iron deficiency  anemia. There was a a transient hiatal hernia with Schatzki's ring. Stomach and duodenum were normal. EGD on 03/06/12 by Dr Cristina Gong for IDA non-obstructing Schatzki's ring at Gastroesophageal junction, otherwise normal esophagus and stomach.    . Seborrheic keratosis, right anterior thigh 12/12/2013  . Shoulder pain, left 01/09/2014  . SICK SINUS SYNDROME 04/27/2006   S/P dual chamber pacemaker placement by Dr Osie Cheeks ([facility]) with pacemaker lead extraction & reinsertion - 07/25/2003  Hospitalization (9/8-9/10, Dr Daneen Schick, III, Cardiology) for Paroxysmal Atrial Fibrillation with RVR and anginal pain secondary to demand/supply mismatch in setting of RVR with known circumflex artery branch disease.    . Sick sinus syndrome with tachycardia ([place])    MDT  . Soft tissue injury of foot 05/03/2011  . Solar lentigo 06/15/2012  . Solitary kidney, acquired 05/17/2010   Surgical removal for transitional cell cancer by Tresa Endo, MD ([place]). Surveillance cystoscopy by Dr Alinda Money [facility]) on 10/19/12 without evidence of cystoscopic recurrence. Recommend RTC one  year for cystoscopy.   Marland Kitchen Spinal stenosis, lumbar   . Transitional cell carcinoma of ureter, history   . Traumatic ecchymosis of left foot 12/07/2017  . Trigger point with neck pain 10/26/2018  . Urge incontinence 12/13/2011   Diagnosed in 10/2011 by Dr Bjorn Loser (Urology)   . Venous stasis ulcer of ankle, left ([place]) 10/24/2014  . VENTRICULAR HYPERTROPHY, LEFT 08/28/2008   Qualifier: Diagnosis of  By: McDiarmid MD, Sherren Mocha    . VITAMIN B12 DEFICIENCY 10/07/2009   Qualifier: Diagnosis of  By: McDiarmid MD, Sherren Mocha  Dx based on a post-TKR anemia work-up Low normal serum B12 with high Methylmalonic acid and homocysteine level  Vit B12 serum level (10/28/10) > 1500 pg/mL   . Vitamin D deficiency 11/02/2010   Serum vitamin D 25(OH) = 10.9 ng/mL (30 -100) on 10/28/10 c/w Vitamin D deficiency.     . Wound of left foot 12/29/2017    Past Surgical History:  Procedure Laterality Date  . BREAST SURGERY     breast reduction  . CARDIOVERSION N/A 10/20/2017   Procedure: CARDIOVERSION;  Surgeon: Josue Hector, MD;  Location: [facility] ENDOSCOPY;  Service: Cardiovascular;  Laterality: N/A;  . CHOLECYSTECTOMY    . CORONARY ANGIOPLASTY WITH STENT PLACEMENT  2010   BMS RCA, OM2 occluded  . CYSTOSCOPY  09/30/2015   No cystoscopic evidence of uroepithelial neoplasm.  Follow up surveillance cystoscopy in one year  . CYSTOSCOPY  10/13/2017   Dr Raynelle Bring [facility])  . DUPUYTREN CONTRACTURE RELEASE Right 05/22/2014   Procedure: DUPUYTREN RELEASE AND REPAIR AS NECESSARY RIGHT RING FINGER AND MIDDLE FINGER;  Surgeon: Roseanne Kaufman, MD;  Location: [place];  Service: Orthopedics;  Laterality: Right;  . ESOPHAGOGASTRODUODENOSCOPY  04/27/2010   Dr Cristina Gong - found transient H/H & Schatzki's ring  . ESOPHAGOGASTRODUODENOSCOPY ENDOSCOPY  03/06/2012   Dr Cristina Gong - found non-obstuctive Schatzki's ring at [employer] o/w normal EGD.   Marland Kitchen EYE SURGERY     bilateral cataracts  . KNEE ARTHROSCOPY W/ SYNOVECTOMY  11/2009, left knee     Dr Wynelle Link  . NEPHRECTOMY  For transition cell cancer    Dr Tresa Endo, surgeon  . PACEMAKER GENERATOR CHANGE N/A 11/12/2012   Procedure: PACEMAKER GENERATOR CHANGE;  Surgeon: Evans Lance, MD; [employer] [facility] dual-chamber pacemaker serial number [id number]; Laterality: Right  . PACEMAKER INSERTION  2005   Dr Doreatha Lew  . PACEMAKER LEAD REMOVAL  2005   Removal and reinsertion of atrial and ventricular leads due to  migration  . REPLACEMENT TOTAL KNEE  2009, Right knee   Dr Wynelle Link  . REPLACEMENT TOTAL KNEE  05/2009, Left knee   Dr Wynelle Link  . TONSILLECTOMY    . TRIGGER FINGER RELEASE Right 05/22/2014   Procedure: RIGHT HAND A-1 PULLEY RELEASE ;  Surgeon: Roseanne Kaufman, MD;  Location: [place];  Service: Orthopedics;  Laterality: Right;     Current Outpatient Medications  Medication Sig Dispense Refill  . albuterol (PROVENTIL) (2.5 MG/3ML) 0.083% nebulizer solution USE 1 VIAL VIA NEBULIZER EVERY 4 HOURS AS NEEDED FOR WHEEZING OR SHORTNESS OF BREATH 360 mL 5  . albuterol (VENTOLIN HFA) 108 (90 Base) MCG/ACT inhaler Inhale 2 puffs into the lungs every 4 (four) hours as needed for wheezing or shortness of breath. 18 g 5  . amiodarone (PACERONE) 200 MG tablet Take 1 tablet (200 mg total) by mouth daily. 90 tablet 3  . atorvastatin (LIPITOR) 20 MG tablet TAKE 1 TABLET BY MOUTH DAILY 90 tablet 3  . ELIQUIS 2.5 MG TABS tablet TAKE 1 TABLET(2.5 MG) BY MOUTH TWICE DAILY 60 tablet 5  . fluticasone (FLONASE) 50 MCG/ACT nasal spray SHAKE LIQUID AND USE 2 SPRAYS IN EACH NOSTRIL DAILY 48 g 3  . Fluticasone-Umeclidin-Vilant (TRELEGY ELLIPTA) 100-62.5-25 MCG/INH AEPB Inhale 1 puff into the lungs daily. 28 each 0  . furosemide (LASIX) 40 MG tablet TAKE 1/2 TABLET(20 MG) BY MOUTH DAILY AS NEEDED FOR FLUID RETENTION OR SWELLING 45 tablet 1  . metoprolol succinate (TOPROL-XL) 25 MG 24 hr tablet TAKE 1 TABLET BY MOUTH DAILY 30 tablet 0  . nitroGLYCERIN (NITROSTAT) 0.4 MG SL tablet Place 1 tablet (0.4 mg  total) under the tongue every 5 (five) minutes as needed for chest pain (x 3 tabs). 25 tablet PRN  . omeprazole (PRILOSEC) 20 MG capsule Take 1 capsule (20 mg total) by mouth daily. 90 capsule 3  . Spacer/Aero-Holding Chambers (AEROCHAMBER MV) inhaler Use as instructed 1 each 0  . traMADol (ULTRAM) 50 MG tablet TAKE 1 TABLET BY MOUTH EVERY 6 HOURS AS NEEDED FOR PAIN 30 tablet 2   No current facility-administered medications for this visit.    Allergies:   Baclofen, Codeine phosphate, Hydrochlorothiazide, Montelukast sodium, and Norco [hydrocodone-acetaminophen]    Social History:  The patient  reports that she quit smoking about 10 years ago. Her smoking use included cigarettes. She smoked 1.00 pack per day. She has never used smokeless tobacco. She reports current alcohol use of about 13.0 standard drinks of alcohol per week. She reports that she does not use drugs.   Family History:  The patient's  family history includes Dementia in her mother; Heart attack in her father; Heart disease in her father.    ROS:  Please see the history of present illness. Otherwise, review of systems are positive for none.   All other systems are reviewed and negative.    PHYSICAL EXAM: VS:  BP 134/76   Pulse 76   Ht 5\' 5"  (1.651 m)   Wt 170 lb 9.6 oz (77.4 kg)   SpO2 97%   BMI 28.39 kg/m  , BMI Body mass index is 28.39 kg/m.   General: Well developed, well nourished, NAD Neck: Negative for carotid bruits. No JVD Lungs: Bilateral wheezing. Breathing is unlabored. Cardiovascular: Irregularly irregular with S1 S2. No murmurs Extremities: Bilateral 1-2+ BLE.  Radial pulses 2+ bilaterally Neuro: Alert and oriented. No focal deficits. No facial asymmetry. MAE spontaneously. Psych: Responds to questions appropriately with normal affect.  EKG:  EKG is ordered today. The ekg ordered today demonstrates atrial fibrillation with HR 96 bpm, similar to prior tracing, PVC   Recent Labs: 08/28/2018: BNP  497.3; TSH 4.040 05/16/2019: ALT 35; BUN 23; Creatinine, Ser 1.70; Hemoglobin 11.0; Platelets 269; Potassium 4.5; Sodium 147    Lipid Panel    Component Value Date/Time   CHOL 130 08/11/2016 1101   TRIG 106 08/11/2016 1101   HDL 37 (L) 08/11/2016 1101   CHOLHDL 3.5 08/11/2016 1101   CHOLHDL 3.4 07/30/2015 1231   VLDL 29 07/30/2015 1231   LDLCALC 72 08/11/2016 1101   LDLDIRECT 59 11/29/2018 1310   LDLDIRECT 74 05/08/2014 1110     Wt Readings from Last 3 Encounters:  07/02/19 170 lb 9.6 oz (77.4 kg)  05/16/19 168 lb (76.2 kg)  12/27/18 176 lb 14.2 oz (80.2 kg)     Other studies Reviewed: Additional studies/ records that were reviewed today include: Review of the above records demonstrates:  Echo 10/19/2017:  - Left ventricle: The cavity size was normal. Wall thickness was  normal. Systolic function was normal. The estimated ejection  fraction was in the range of 50% to 55%. Wall motion was normal;  there were no regional wall motion abnormalities. The study is  not technically sufficient to allow evaluation of LV diastolic  function.  - Mitral valve: Mildly thickened leaflets . There was mild to  moderate regurgitation.  - Left atrium: Severely dilated.  - Right ventricle: Poorly visualized. Pacer wire or catheter noted  in right ventricle.  - Right atrium: The atrium was mildly dilated. Pacer wire or  catheter noted in right atrium.  - Tricuspid valve: There was mild regurgitation.  - Pulmonary arteries: PA peak pressure: 18 mm Hg (S).  - Inferior vena cava: The vessel was normal in size. The  respirophasic diameter changes were in the normal range (>= 50%),  consistent with normal central venous pressure.  ASSESSMENT AND PLAN:  1.  CAD with prior PCI/BMS to RCA: -Denies anginal symptoms -Continue secondary prevention with statin, BB -No ASA in the setting of Eliquis   2.  Chronic diastolic CHF: -Has mild 4-6+ BLE >> recommend 2 days of as  needed Lasix.  Patient to call if no resolution. -BMET  3.  CKD stage IV: -Last creatinine, 1.7>>down from 2.16 in 03/19/2019  4.  Paroxysmal atrial fibrillation on chronic anticoagulation: -Maintaining NSR on amiodarone therapy>> previous documentation noted 200 mg p.o. twice daily however patient was taking 200 mg daily at last OV -Anticoagulated with Eliquis without acute signs or symptoms of bleeding in stool or urine -Continue low dose Eliquis  -Last TSH, 4.040, WNL 08/28/2018>> repeat today -CXR 10/17/2017>> repeat  -Receives regular eye exams   5. Hx of tachybrady syndrome: -s/p PPM placement ' -Last download 05/10/2018   Current medicines are reviewed at length with the patient today.  The patient does not have concerns regarding medicines.  The following changes have been made:  no change  Labs/ tests ordered today include: TSH, BMET, CXR  Orders Placed This Encounter  Procedures  . DG Chest 2 View  . Basic metabolic panel  . TSH  . EKG 12-Lead    Disposition:   FU with Dr. [name]  in 6 months  Signed, Kathyrn Drown, NP  07/02/2019 11:36 AM    [address], [place], [place]  [address] Phone: [phone]; Fax: [phone]

## 2019-07-02 ENCOUNTER — Encounter: Payer: Self-pay | Admitting: Cardiology

## 2019-07-02 ENCOUNTER — Encounter: Payer: Medicare Other | Admitting: Student

## 2019-07-02 ENCOUNTER — Other Ambulatory Visit: Payer: Self-pay

## 2019-07-02 ENCOUNTER — Ambulatory Visit (INDEPENDENT_AMBULATORY_CARE_PROVIDER_SITE_OTHER): Payer: Medicare Other | Admitting: Cardiology

## 2019-07-02 VITALS — BP 134/76 | HR 76 | Ht 65.0 in | Wt 170.6 lb

## 2019-07-02 DIAGNOSIS — I2510 Atherosclerotic heart disease of native coronary artery without angina pectoris: Secondary | ICD-10-CM

## 2019-07-02 DIAGNOSIS — Z950 Presence of cardiac pacemaker: Secondary | ICD-10-CM

## 2019-07-02 DIAGNOSIS — I5032 Chronic diastolic (congestive) heart failure: Secondary | ICD-10-CM | POA: Diagnosis not present

## 2019-07-02 DIAGNOSIS — Z9861 Coronary angioplasty status: Secondary | ICD-10-CM

## 2019-07-02 DIAGNOSIS — N184 Chronic kidney disease, stage 4 (severe): Secondary | ICD-10-CM | POA: Diagnosis not present

## 2019-07-02 DIAGNOSIS — Z79899 Other long term (current) drug therapy: Secondary | ICD-10-CM

## 2019-07-02 NOTE — Patient Instructions (Signed)
Medication Instructions:   Your physician recommends that you continue on your current medications as directed. Please refer to the Current Medication list given to you today.  *If you need a refill on your cardiac medications before your next appointment, please call your pharmacy*  Lab Work:  You will have labs drawn today: BMET/TSH  If you have labs (blood work) drawn today and your tests are completely normal, you will receive your results only by: Marland Kitchen MyChart Message (if you have MyChart) OR . A paper copy in the mail If you have any lab test that is abnormal or we need to change your treatment, we will call you to review the results.  Testing/Procedures:  Chest X-ray Instructions:    1. You may have this done at the [facility], located in the [place] on the 1st floor.    2. You do no have to have an appointment.    3. [place], [place] [address]        [phone]        Monday - Friday  8:00 am - 5:00 pm  Follow-Up: At [facility], you and your health needs are our priority.  As part of our continuing mission to provide you with exceptional heart care, we have created designated Provider Care Teams.  These Care Teams include your primary Cardiologist (physician) and Advanced Practice Providers (APPs -  Physician Assistants and Nurse Practitioners) who all work together to provide you with the care you need, when you need it.  We recommend signing up for the patient portal called "MyChart".  Sign up information is provided on this After Visit Summary.  MyChart is used to connect with patients for Virtual Visits (Telemedicine).  Patients are able to view lab/test results, encounter notes, upcoming appointments, etc.  Non-urgent messages can be sent to your provider as well.   To learn more about what you can do with MyChart, go to [email or web address].    Your next appointment:   6 month(s)  The format for your next  appointment:   In Person  Provider:   Daneen Schick, MD

## 2019-07-02 NOTE — Progress Notes (Signed)
Electrophysiology Office Note Date: 07/03/2019  ID:  [name], DOB 05/27/1923, MRN [id number]  PCP: McDiarmid, Blane Ohara, MD Primary Cardiologist: Sinclair Grooms, MD Electrophysiologist: Cristopher Peru, MD   CC: Pacemaker follow-up  [name] is a 86 y.o. female seen today for Cristopher Peru, MD for routine electrophysiology followup.  Since last being seen in our clinic the patient reports doing very well.  she denies chest pain, palpitations, dyspnea, PND, orthopnea, nausea, vomiting, dizziness, syncope, weight gain, or early satiety. She has chronic peripheral edema managed by her PCP in the setting of CKD. She does not use lasix very often. Has poor tolerance of compression hose.   Device History: Medtronic Dual Chamber PPM implanted 2005, gen change 2014 for CHB  Past Medical History:  Diagnosis Date   Abnormal mammogram, unspecified 08/23/2010   Followup imaging reassuring.    Acute appendicitis with rupture    Acute on chronic diastolic heart failure ([place]) 10/16/2017   ADENOMATOUS COLONIC POLYP 03/01/2003   Qualifier: Diagnosis of  By: McDiarmid MD, Sherren Mocha Multiple benign polyps of cecum, ascending, transverse and sigmoid colon by 8/09 colonoscopy by Dr Cristina Gong  Colonoscopy by Dr Cristina Gong for iron-deficiency anemia on 04/27/2010 showed three sessile polyps that were in ascending (3 mm x 9 mm), transverse (4 mm), and cecum (3 mm).  All three were tubular adenomas that were negative for high grade dysplasia or malignancy on pathology. Dr Cristina Gong called the polpys benign and not requiring follow-up in view of the patients age.     Adrenal adenoma    Incidentaloma   AF (paroxysmal atrial fibrillation) ([place]) 11/11/2010   Hospitalization (9/8-9/10, Dr Daneen Schick, III, Cardiology) for Paroxysmal Atrial Fibrillation with RVR and anginal pain secondary to demand/supply mismatch in setting of RVR with known circumflex artery branch disease.     AKI (acute kidney injury) ([place])    ANXIETY  04/27/2006   Qualifier: Diagnosis of  By: McDiarmid MD, Todd     Benign essential tremor 02/04/2016   Candidiasis of the esophagus 11/26/2007   CAP (community acquired pneumonia) 02/25/2015   Cataract 2013   Bilateral    Cellulitis of leg, right 10/01/2012   Cellulitis of right lower extremity    Chest pain with moderate risk of acute coronary syndrome 05/21/2015   Chronic kidney disease (CKD), stage III (moderate)    Concussion with loss of consciousness    COPD 04/27/2006   Qualifier: Diagnosis of  By: McDiarmid MD, Sherren Mocha     COPD exacerbation ([place])    COPD, severe    Decreased functional mobility and endurance 03/17/2015   Dementia ([place]), possible Alzhemier's type 10/26/2012   Montreal Cognitive Assessment (25 out of 30, points off in visuospatial and and short-term memory).  Independent in iADLs and ADLs.     Diastolic heart failure ([place]) 07/30/2015   DISC WITH RADICULOPATHY 04/27/2006   Qualifier: Diagnosis of  By: McDiarmid MD, Todd     Dyspnea    EDEMA-LEGS,DUE TO VENOUS OBSTRUCT. 04/27/2006   Qualifier: Diagnosis of  By: McDiarmid MD, Desma Paganini 10/27/2016   Fracture of 5th metatarsal, Right, avulsion fx of tip styloid 11/09/2017   GERD (gastroesophageal reflux disease) 11/08/2015   Gout of wrist due to drug 03/15/2010   Qualifier: Diagnosis of  By: McDiarmid MD, Sherren Mocha  Possibly precipitated by HCTZ. Normal uric acid serum level at time of attack.     Hand trauma, right, initial encounter 03/20/2018   HERNIA, HIATAL,  NONCONGENITAL 04/27/2006   Qualifier: Diagnosis of  By: McDiarmid MD, Sherren Mocha     High risk medications (not anticoagulants) long-term use 03/05/2012   History of Hemorrhoids 04/27/2006   Qualifier: Diagnosis of  By: McDiarmid MD, Sherren Mocha     History of iron deficiency 01/16/2015   History of Iron deficiency anemia due to chronic GI blood loss 08/05/2010   Dr Cristina Gong (GI) has evaluated with EGD, colonoscopy, and video capsular endoscopy in 2011 & 2012.  All  have been unrevealing as to an origin of IDA.  OV with Dr Cristina Gong (10/28/10) assessment of blood in stool per hemoccult and GER. Hbg 12.1 g/dL, MCV 91.8, Ferritin 30 ng/mL. Patient taking on ferrous sulfate tab daily.   EGD on 03/06/12 by Dr Cristina Gong for IDA non-obstructing Schatzki's ring at [place]   Hx of colonoscopy with polypectomy 04/27/2010   Dr Cristina Gong found three  tubular adenomas each less than 10 mm size   Hypertension    Hypotension 07/09/2015   Impaired mobility and ADLs 09/01/2017   Iron deficiency anemia 08/05/2010   Dr Buccini (GI) has evaluated with EGD, colonoscopy, and video capsular endoscopy in 2011 & 2012.  All have been unrevealing as to an origin of IDA.  OV with Dr Cristina Gong (10/28/10) assessment of blood in stool per hemoccult and GER. Hbg 12.1 g/dL, MCV 91.8, Ferritin 30 ng/mL. Patient taking on ferrous sulfate tab daily.   EGD on 03/06/12 by Dr Cristina Gong for IDA non-obstructing Schatzki's ring at Gastroesophageal junction, otherwise normal esophagus and stomach.      Laceration of arm, unspecified laterality, initial encounter 11/03/2017   Left leg cellulitis    Left Metatarsal fracture, 5th, avulsion styloid 12/29/2017   Leg cramps 05/29/2012   Low back pain    Lumbar herniated disc    History of HNP L4/5 in 2003   Macular degeneration, bilateral 10/04/2010   Right eye is wet MD, the other is dry macular degeneration (ARMD). Pt undergoing some form of vascular endothelial growth factor inhibition intraocular therapy.     Mild cognitive impairment 10/26/2012   (10/25/12) Failed MiniCog screen   MUSCLE CRAMPS 03/11/2010   Qualifier: Diagnosis of  By: McDiarmid MD, Todd     Muscle spasm of back 09/24/2013   Myocardial infarct, old    Nocturia 10/28/2016   Numbness and tingling in hands 07/21/2011   Pacemaker    MDT   Paroxysmal atrial fibrillation ([place])    Persistent atrial fibrillation ([place]) 05/31/2014   PREDIABETES 09/11/2007   Qualifier: Diagnosis of  By: McDiarmid MD,  Sherren Mocha     Retinal hemorrhage of left eye 06/2010   RHINITIS, ALLERGIC 04/27/2006   Qualifier: Diagnosis of  By: McDiarmid MD, Dellia Nims RING, HX OF 11/26/2007   Qualifier: Diagnosis of  By: McDiarmid MD, Sherren Mocha  An EGD was performed by Dr Cristina Gong on 04/27/2010 for iron deficiency anemia. There was a a transient hiatal hernia with Schatzki's ring. Stomach and duodenum were normal. EGD on 03/06/12 by Dr Cristina Gong for IDA non-obstructing Schatzki's ring at Gastroesophageal junction, otherwise normal esophagus and stomach.     Seborrheic keratosis, right anterior thigh 12/12/2013   Shoulder pain, left 01/09/2014   SICK SINUS SYNDROME 04/27/2006   S/P dual chamber pacemaker placement by Dr Osie Cheeks (EPS-Card) with pacemaker lead extraction & reinsertion - 07/25/2003  Hospitalization (9/8-9/10, Dr Daneen Schick, III, Cardiology) for Paroxysmal Atrial Fibrillation with RVR and anginal pain secondary to demand/supply mismatch in setting of RVR with known  circumflex artery branch disease.     Sick sinus syndrome with tachycardia [facility])    MDT   Soft tissue injury of foot 05/03/2011   Solar lentigo 06/15/2012   Solitary kidney, acquired 05/17/2010   Surgical removal for transitional cell cancer by Tresa Endo, MD ([place]). Surveillance cystoscopy by Dr Alinda Money [facility]) on 10/19/12 without evidence of cystoscopic recurrence. Recommend RTC one year for cystoscopy.    Spinal stenosis, lumbar    Transitional cell carcinoma of ureter, history    Traumatic ecchymosis of left foot 12/07/2017   Trigger point with neck pain 10/26/2018   Urge incontinence 12/13/2011   Diagnosed in 10/2011 by Dr Bjorn Loser (Urology)    Venous stasis ulcer of ankle, left ([place]) 10/24/2014   VENTRICULAR HYPERTROPHY, LEFT 08/28/2008   Qualifier: Diagnosis of  By: McDiarmid MD, Todd     VITAMIN B12 DEFICIENCY 10/07/2009   Qualifier: Diagnosis of  By: McDiarmid MD, Sherren Mocha  Dx based on a post-TKR anemia work-up  Low normal serum B12 with high Methylmalonic acid and homocysteine level  Vit B12 serum level (10/28/10) > 1500 pg/mL    Vitamin D deficiency 11/02/2010   Serum vitamin D 25(OH) = 10.9 ng/mL (30 -100) on 10/28/10 c/w Vitamin D deficiency.      Wound of left foot 12/29/2017   Past Surgical History:  Procedure Laterality Date   BREAST SURGERY     breast reduction   CARDIOVERSION N/A 10/20/2017   Procedure: CARDIOVERSION;  Surgeon: Josue Hector, MD;  Location: [facility] ENDOSCOPY;  Service: Cardiovascular;  Laterality: N/A;   CHOLECYSTECTOMY     CORONARY ANGIOPLASTY WITH STENT PLACEMENT  2010   BMS RCA, OM2 occluded   CYSTOSCOPY  09/30/2015   No cystoscopic evidence of uroepithelial neoplasm.  Follow up surveillance cystoscopy in one year   CYSTOSCOPY  10/13/2017   Dr Raynelle Bring ([facility])   [place] Right 05/22/2014   Procedure: DUPUYTREN RELEASE AND REPAIR AS NECESSARY RIGHT RING FINGER AND MIDDLE FINGER;  Surgeon: Roseanne Kaufman, MD;  Location: [facility];  Service: Orthopedics;  Laterality: Right;   ESOPHAGOGASTRODUODENOSCOPY  04/27/2010   Dr Cristina Gong - found transient H/H & Schatzki's ring   ESOPHAGOGASTRODUODENOSCOPY ENDOSCOPY  03/06/2012   Dr Cristina Gong - found non-obstuctive Schatzki's ring at [employer] [facility] o/w normal EGD.    EYE SURGERY     bilateral cataracts   KNEE ARTHROSCOPY W/ SYNOVECTOMY  11/2009, left knee   Dr Wynelle Link   NEPHRECTOMY  For transition cell cancer    Dr Tresa Endo, surgeon   PACEMAKER GENERATOR CHANGE N/A 11/12/2012   Procedure: PACEMAKER GENERATOR CHANGE;  Surgeon: Evans Lance, MD; Medtronic [facility] dual-chamber pacemaker serial number [id number]; Laterality: Right   PACEMAKER INSERTION  2005   Dr Doreatha Lew   PACEMAKER LEAD REMOVAL  2005   Removal and reinsertion of atrial and ventricular leads due to migration   REPLACEMENT TOTAL KNEE  2009, Right knee   Dr Wynelle Link   REPLACEMENT TOTAL KNEE  05/2009, Left knee   Dr Wynelle Link    TONSILLECTOMY     TRIGGER FINGER RELEASE Right 05/22/2014   Procedure: RIGHT HAND A-1 PULLEY RELEASE ;  Surgeon: Roseanne Kaufman, MD;  Location: [facility];  Service: Orthopedics;  Laterality: Right;    Current Outpatient Medications  Medication Sig Dispense Refill   albuterol (PROVENTIL) (2.5 MG/3ML) 0.083% nebulizer solution USE 1 VIAL VIA NEBULIZER EVERY 4 HOURS AS NEEDED FOR WHEEZING OR SHORTNESS OF BREATH 360 mL 5   albuterol (VENTOLIN  HFA) 108 (90 Base) MCG/ACT inhaler Inhale 2 puffs into the lungs every 4 (four) hours as needed for wheezing or shortness of breath. 18 g 5   amiodarone (PACERONE) 200 MG tablet Take 1 tablet (200 mg total) by mouth daily. 90 tablet 3   atorvastatin (LIPITOR) 20 MG tablet TAKE 1 TABLET BY MOUTH DAILY 90 tablet 3   ELIQUIS 2.5 MG TABS tablet TAKE 1 TABLET(2.5 MG) BY MOUTH TWICE DAILY 60 tablet 5   fluticasone (FLONASE) 50 MCG/ACT nasal spray SHAKE LIQUID AND USE 2 SPRAYS IN EACH NOSTRIL DAILY 48 g 3   Fluticasone-Umeclidin-Vilant (TRELEGY ELLIPTA) 100-62.5-25 MCG/INH AEPB Inhale 1 puff into the lungs daily. 28 each 0   furosemide (LASIX) 40 MG tablet TAKE 1/2 TABLET(20 MG) BY MOUTH DAILY AS NEEDED FOR FLUID RETENTION OR SWELLING 45 tablet 1   metoprolol succinate (TOPROL-XL) 25 MG 24 hr tablet TAKE 1 TABLET BY MOUTH DAILY 30 tablet 0   nitroGLYCERIN (NITROSTAT) 0.4 MG SL tablet Place 1 tablet (0.4 mg total) under the tongue every 5 (five) minutes as needed for chest pain (x 3 tabs). 25 tablet PRN   omeprazole (PRILOSEC) 20 MG capsule Take 1 capsule (20 mg total) by mouth daily. 90 capsule 3   Spacer/Aero-Holding Chambers (AEROCHAMBER MV) inhaler Use as instructed 1 each 0   traMADol (ULTRAM) 50 MG tablet TAKE 1 TABLET BY MOUTH EVERY 6 HOURS AS NEEDED FOR PAIN 30 tablet 2   No current facility-administered medications for this visit.    Allergies:   Baclofen, Codeine phosphate, Hydrochlorothiazide, Montelukast sodium, and Norco  [hydrocodone-acetaminophen]   Social History: Social History   Socioeconomic History   Marital status: Widowed    Spouse name: Not on file   Number of children: 4   Years of education: Not on file   Highest education level: Not on file  Occupational History   Occupation: Art therapist: RETIRED    Comment: Retired  Tobacco Use   Smoking status: Former Smoker    Packs/day: 1.00    Types: Cigarettes    Quit date: 03/02/2009    Years since quitting: 10.3   Smokeless tobacco: Never Used  Substance and Sexual Activity   Alcohol use: Yes    Alcohol/week: 13.0 standard drinks    Types: 7 Glasses of wine, 6 Standard drinks or equivalent per week    Comment: 5 oz white wine per day   Drug use: No   Sexual activity: Never  Other Topics Concern   Not on file  Social History Narrative   Widow of local district court judge,    Lives with one daughter (flight attendant) has 4 dgts total who are very involved;    One grandson born in 2010   one grandpuppy "Molly.".    Semi-retired Futures trader.     Pt has home nebulizer for inhalation therapies.   Former Smoker   Smoking Status:  quit > 5 years ago      (+) DNR status per discussion with Dr McDiarmid 10/25/12 and reiterated 06/20/13 office visit .   (+) Living Will/Advance Directive   (+) HC-POA: Cecile Sheerer Causey (pt's dgt)      Current Social History 09/22/2016        Patient lives with daughter, [name] (flight attendant), in two level home 09/22/2016   Transportation: Patient has own vehicle 09/22/2016   Important Relationships 4 daughters and 27 yo grandson 09/22/2016    Pets: None 09/22/2016   Education /  Work:  College/ Retired Futures trader 09/22/2016   Interests / Fun: Reads 2 books per week, watches TV, entertains 09/22/2016   Current Stressors: 86 yo house in need of outside repairs 09/22/2016   Religious / Personal Beliefs: Joanie Coddington 09/22/2016   Other: Had a wonderful marriage/ Very  thankful/ wants to continue traveling/ closest friends are all gone 09/22/2016   L. Ducatte, RN, BSN                                                                                                  Social Determinants of Health   Financial Resource Strain:    Difficulty of Paying Living Expenses:   Food Insecurity:    Worried About Charity fundraiser in the Last Year:    Arboriculturist in the Last Year:   Transportation Needs:    Film/video editor (Medical):    Lack of Transportation (Non-Medical):   Physical Activity:    Days of Exercise per Week:    Minutes of Exercise per Session:   Stress:    Feeling of Stress :   Social Connections:    Frequency of Communication with Friends and Family:    Frequency of Social Gatherings with Friends and Family:    Attends Religious Services:    Active Member of Clubs or Organizations:    Attends Music therapist:    Marital Status:   Intimate Partner Violence:    Fear of Current or Ex-Partner:    Emotionally Abused:    Physically Abused:    Sexually Abused:     Family History: Family History  Problem Relation Age of Onset   Dementia Mother    Heart disease Father    Heart attack Father      Review of Systems: All other systems reviewed and are otherwise negative except as noted above.  Physical Exam: Vitals:   07/03/19 1103  BP: 120/70  Pulse: 95  SpO2: 96%  Weight: 170 lb 12.8 oz (77.5 kg)  Height: 5\' 5"  (1.651 m)    GEN- The patient is well appearing, alert and oriented x 3 today.   HEENT: normocephalic, atraumatic; sclera clear, conjunctiva pink; hearing intact; oropharynx clear; neck supple  Lungs- Clear to ausculation bilaterally, normal work of breathing.  No wheezes, rales, rhonchi Heart- Regular rate and rhythm, no murmurs, rubs or gallops  GI- soft, non-tender, non-distended, bowel sounds present  Extremities- no clubbing or cyanosis. Chronic 2+ edema bilaterally with  chronic venous stasis changes.  MS- no significant deformity or atrophy Skin- warm and dry, no rash or lesion; PPM pocket well healed Psych- euthymic mood, full affect Neuro- strength and sensation are intact  PPM Interrogation- reviewed in detail today,  See PACEART report  EKG:  EKG is not ordered today. AF/VS by device interrogation.  Recent Labs: 08/28/2018: BNP 497.3 05/16/2019: ALT 35; Hemoglobin 11.0; Platelets 269 07/02/2019: BUN 32; Creatinine, Ser 1.85; Potassium 4.5; Sodium 146; TSH 3.110   Wt Readings from Last 3 Encounters:  07/03/19 170 lb 12.8 oz (77.5 kg)  07/02/19 170 lb 9.6 oz (  77.4 kg)  05/16/19 168 lb (76.2 kg)     Other studies Reviewed: Additional studies/ records that were reviewed today include: Previous EP office notes, Previous remote checks, Most recent labwork.   Assessment and Plan:  1. CHB s/p Medtronic PPM  Normal PPM function See Pace Art report No changes today  2. Atrial fibrillation AF today by device check. Burden ~ 46%. Continue amiodarone 200 mg daily for overall rate/rhythm control. Continue Eliquis for CHA2DS2VASC of at least 6    3. Chronic diastolic CHF NYHA II-III symptoms chronically Volume status stable centrally; Chronic peripheral edema in setting of chronic venous insufficiency. She is hesitant to use lasix with CKD.   Current medicines are reviewed at length with the patient today.   The patient does not have concerns regarding her medicines.  The following changes were made today:  none  Labs/ tests ordered today include:  Orders Placed This Encounter  Procedures   CUP PACEART INCLINIC DEVICE CHECK   Disposition:   Follow up with Dr. Lovena Le in 12 Months. Will look into remotes. If not capable, will need device check in 6 months.   Jacalyn Lefevre, PA-C  07/03/2019 11:35 AM  [facility] [address] [place]  [place] [address] [phone] (office) [phone] (fax)

## 2019-07-03 ENCOUNTER — Encounter: Payer: Self-pay | Admitting: Student

## 2019-07-03 ENCOUNTER — Ambulatory Visit (INDEPENDENT_AMBULATORY_CARE_PROVIDER_SITE_OTHER): Payer: Medicare Other | Admitting: Student

## 2019-07-03 VITALS — BP 120/70 | HR 95 | Ht 65.0 in | Wt 170.8 lb

## 2019-07-03 DIAGNOSIS — Z79899 Other long term (current) drug therapy: Secondary | ICD-10-CM

## 2019-07-03 DIAGNOSIS — I495 Sick sinus syndrome: Secondary | ICD-10-CM

## 2019-07-03 DIAGNOSIS — M7989 Other specified soft tissue disorders: Secondary | ICD-10-CM | POA: Diagnosis not present

## 2019-07-03 DIAGNOSIS — I2510 Atherosclerotic heart disease of native coronary artery without angina pectoris: Secondary | ICD-10-CM | POA: Diagnosis not present

## 2019-07-03 DIAGNOSIS — I5032 Chronic diastolic (congestive) heart failure: Secondary | ICD-10-CM

## 2019-07-03 DIAGNOSIS — I480 Paroxysmal atrial fibrillation: Secondary | ICD-10-CM

## 2019-07-03 DIAGNOSIS — Z9861 Coronary angioplasty status: Secondary | ICD-10-CM

## 2019-07-03 LAB — BASIC METABOLIC PANEL
BUN/Creatinine Ratio: 17 (ref 12–28)
BUN: 32 mg/dL (ref 10–36)
CO2: 24 mmol/L (ref 20–29)
Calcium: 8.7 mg/dL (ref 8.7–10.3)
Chloride: 107 mmol/L — ABNORMAL HIGH (ref 96–106)
Creatinine, Ser: 1.85 mg/dL — ABNORMAL HIGH (ref 0.57–1.00)
GFR calc Af Amer: 27 mL/min/{1.73_m2} — ABNORMAL LOW (ref >59)
GFR calc non Af Amer: 23 mL/min/{1.73_m2} — ABNORMAL LOW (ref >59)
Glucose: 133 mg/dL — ABNORMAL HIGH (ref 65–99)
Potassium: 4.5 mmol/L (ref 3.5–5.2)
Sodium: 146 mmol/L — ABNORMAL HIGH (ref 134–144)

## 2019-07-03 LAB — CUP PACEART INCLINIC DEVICE CHECK
Date Time Interrogation Session: 20210505112520
Implantable Lead Implant Date: 20050523
Implantable Lead Implant Date: 20050523
Implantable Lead Location: 753859
Implantable Lead Location: 753860
Implantable Lead Model: 5076
Implantable Lead Model: 5076
Implantable Pulse Generator Implant Date: 20140915

## 2019-07-03 LAB — TSH: TSH: 3.11 u[IU]/mL (ref 0.450–4.500)

## 2019-07-03 NOTE — Patient Instructions (Signed)
Medication Instructions:  none *If you need a refill on your cardiac medications before your next appointment, please call your pharmacy*   Lab Work: none If you have labs (blood work) drawn today and your tests are completely normal, you will receive your results only by: Marland Kitchen MyChart Message (if you have MyChart) OR . A paper copy in the mail If you have any lab test that is abnormal or we need to change your treatment, we will call you to review the results.   Testing/Procedures: none   Follow-Up: At [facility], you and your health needs are our priority.  As part of our continuing mission to provide you with exceptional heart care, we have created designated Provider Care Teams.  These Care Teams include your primary Cardiologist (physician) and Advanced Practice Providers (APPs -  Physician Assistants and Nurse Practitioners) who all work together to provide you with the care you need, when you need it.  We recommend signing up for the patient portal called "MyChart".  Sign up information is provided on this After Visit Summary.  MyChart is used to connect with patients for Virtual Visits (Telemedicine).  Patients are able to view lab/test results, encounter notes, upcoming appointments, etc.  Non-urgent messages can be sent to your provider as well.   To learn more about what you can do with MyChart, go to [email or web address].    Your next appointment:   1 year(s)  The format for your next appointment:   Either In Person or Virtual  Provider:   Dr Lovena Le   Other Instructions

## 2019-07-04 ENCOUNTER — Ambulatory Visit (INDEPENDENT_AMBULATORY_CARE_PROVIDER_SITE_OTHER): Payer: Medicare Other | Admitting: Family Medicine

## 2019-07-04 ENCOUNTER — Encounter: Payer: Self-pay | Admitting: Family Medicine

## 2019-07-04 ENCOUNTER — Other Ambulatory Visit: Payer: Self-pay

## 2019-07-04 VITALS — Wt 173.0 lb

## 2019-07-04 DIAGNOSIS — Z9861 Coronary angioplasty status: Secondary | ICD-10-CM | POA: Diagnosis not present

## 2019-07-04 DIAGNOSIS — R531 Weakness: Secondary | ICD-10-CM

## 2019-07-04 DIAGNOSIS — D500 Iron deficiency anemia secondary to blood loss (chronic): Secondary | ICD-10-CM | POA: Diagnosis not present

## 2019-07-04 DIAGNOSIS — R600 Localized edema: Secondary | ICD-10-CM

## 2019-07-04 DIAGNOSIS — I2510 Atherosclerotic heart disease of native coronary artery without angina pectoris: Secondary | ICD-10-CM | POA: Diagnosis not present

## 2019-07-04 DIAGNOSIS — N184 Chronic kidney disease, stage 4 (severe): Secondary | ICD-10-CM | POA: Diagnosis not present

## 2019-07-04 DIAGNOSIS — I872 Venous insufficiency (chronic) (peripheral): Secondary | ICD-10-CM

## 2019-07-04 MED ORDER — 3-IN-1 BEDSIDE TOILET MISC: 1.0000 | Freq: Every day | 0 refills | Status: DC

## 2019-07-04 NOTE — Patient Instructions (Signed)
The Unna boot is to help protect the blister on your ankle.  If the blister bursts, it is my hope that the boot and dressing will protect the wound and help it heal.    Please come in next week for nurses to change the Unna boot.  I will see you again on May 20th, to decide  If you need any further Unna boot compression therapy.   There is a prescription for a Surveyor, minerals use put over the commode to help you rise off the toilet.

## 2019-07-05 DIAGNOSIS — I83029 Varicose veins of left lower extremity with ulcer of unspecified site: Secondary | ICD-10-CM | POA: Insufficient documentation

## 2019-07-05 DIAGNOSIS — I872 Venous insufficiency (chronic) (peripheral): Secondary | ICD-10-CM | POA: Insufficient documentation

## 2019-07-05 DIAGNOSIS — I83019 Varicose veins of right lower extremity with ulcer of unspecified site: Secondary | ICD-10-CM

## 2019-07-05 DIAGNOSIS — L97919 Non-pressure chronic ulcer of unspecified part of right lower leg with unspecified severity: Secondary | ICD-10-CM

## 2019-07-05 HISTORY — DX: Non-pressure chronic ulcer of unspecified part of left lower leg with unspecified severity: I83.019

## 2019-07-05 HISTORY — DX: Varicose veins of left lower extremity with ulcer of unspecified site: I83.029

## 2019-07-05 HISTORY — DX: Non-pressure chronic ulcer of unspecified part of right lower leg with unspecified severity: L97.919

## 2019-07-05 LAB — CBC WITH DIFFERENTIAL/PLATELET
Basophils Absolute: 0 10*3/uL (ref 0.0–0.2)
Basos: 0 %
EOS (ABSOLUTE): 0.1 10*3/uL (ref 0.0–0.4)
Eos: 2 %
Hematocrit: 31.6 % — ABNORMAL LOW (ref 34.0–46.6)
Hemoglobin: 9.9 g/dL — ABNORMAL LOW (ref 11.1–15.9)
Immature Grans (Abs): 0 10*3/uL (ref 0.0–0.1)
Immature Granulocytes: 0 %
Lymphocytes Absolute: 1.1 10*3/uL (ref 0.7–3.1)
Lymphs: 16 %
MCH: 28.8 pg (ref 26.6–33.0)
MCHC: 31.3 g/dL — ABNORMAL LOW (ref 31.5–35.7)
MCV: 92 fL (ref 79–97)
Monocytes Absolute: 0.9 10*3/uL (ref 0.1–0.9)
Monocytes: 13 %
Neutrophils Absolute: 4.6 10*3/uL (ref 1.4–7.0)
Neutrophils: 69 %
Platelets: 279 10*3/uL (ref 150–450)
RBC: 3.44 x10E6/uL — ABNORMAL LOW (ref 3.77–5.28)
RDW: 13.7 % (ref 11.7–15.4)
WBC: 6.8 10*3/uL (ref 3.4–10.8)

## 2019-07-05 LAB — FERRITIN: Ferritin: 77 ng/mL (ref 15–150)

## 2019-07-05 NOTE — Addendum Note (Signed)
Addended by: Lissa Morales D on: 07/05/2019 08:38 AM   Modules accepted: Level of Service

## 2019-07-05 NOTE — Assessment & Plan Note (Signed)
New problem Hemorrhagic Bulla, 3 cm, just superior right medial malleolus. Suspect due to venous insufficiency with unrecalled trauma.   Plan Colloid dressing placed over the bulla Unna boot compression wrap applied with Coban covering [name] in to return in one week for a Nurse Visit for [employer] removal and replacement She is to see [name] the next week after her Nurse Visit for wound evaluation.

## 2019-07-05 NOTE — Progress Notes (Signed)
CPT E&M Office Visit Time Before Visit; reviewing medical records (e.g. recent visits, labs, studies): 5 minutes During Visit (F2F time): 15 minutes After Visit (discussion with family or HCP, prescribing, ordering, referring, calling result/recommendations or documenting on same day): 5 minutes Total Visit Time: 30 minutes  Level 2 Est: 10-19 min     New: 15-29 min Level 3 Est: 20-29 min     New: 30-44 min Level 4 Est: 30-39 min     New: 45-59 min Level 5 Est: 40-54 min     New: 60-74 min   > Level 5 - see prolonged service CPT E&M codes; 99XXX  Physical Exam VS reviewed Right Leg: +1 pitting edema foot dorsum ankle and extending up half of lower leg.  3 cm diameter round hemorrhagic bulla located just superior medial malleolus.  Non-tense bulla.  No spread with compression. Nontender.  Normal appearing surrounding skin.   Visit Problem List with A/P  No problem-specific Assessment & Plan notes found for this encounter.

## 2019-07-11 ENCOUNTER — Ambulatory Visit: Payer: Medicare Other

## 2019-07-11 ENCOUNTER — Other Ambulatory Visit: Payer: Self-pay

## 2019-07-11 DIAGNOSIS — Z5189 Encounter for other specified aftercare: Secondary | ICD-10-CM

## 2019-07-11 NOTE — Progress Notes (Signed)
Patient presents in nurse clinic for removal/replacement of unna boot for hemorrhagic bulla on right ankle. Unna boot was successfully removed and leg looked smooth, no new open wounds. Ankle wound still present and bleeding on Colloid dressing removal. Patient reports no issues over the week with leg, no signs of leaking fluid or blood from leg or ankle. Patient reports her left leg now seems to be "bubbling" up with fluid pockets. I precepted with Owens Shark who came in to help assess patient. Per Owens Shark, no unna boot needed today on right leg, will reassess at PCP visit next Thursday. Unfortunately, we are out of Colloid dressing. Per [employer] instruction, I placed a Bactroban covered sterile 4x4 on right ankle bulla with tape and wrapped leg with Kerlix dressing. The same was done to left leg. Bactroban covered sterile 4x4 was placed on new onset of wound on left leg and leg wrapped with Kerlix dressing. Patient was given a bag with sterile 4x4, tape, Kerlix wrap, and Vaseline tubes to help treat her wounds. Patient encouraged to wear her compression stockings and to elevate her legs and feet. Patient to call back on Monday with updates. Patient to keep PCP apt for 5/20.

## 2019-07-17 ENCOUNTER — Other Ambulatory Visit: Payer: Self-pay | Admitting: Family Medicine

## 2019-07-17 ENCOUNTER — Other Ambulatory Visit: Payer: Self-pay | Admitting: Internal Medicine

## 2019-07-17 DIAGNOSIS — K219 Gastro-esophageal reflux disease without esophagitis: Secondary | ICD-10-CM

## 2019-07-18 ENCOUNTER — Other Ambulatory Visit: Payer: Self-pay

## 2019-07-18 ENCOUNTER — Encounter: Payer: Self-pay | Admitting: Family Medicine

## 2019-07-18 ENCOUNTER — Telehealth: Payer: Self-pay | Admitting: Emergency Medicine

## 2019-07-18 ENCOUNTER — Ambulatory Visit (INDEPENDENT_AMBULATORY_CARE_PROVIDER_SITE_OTHER): Payer: Medicare Other | Admitting: Family Medicine

## 2019-07-18 DIAGNOSIS — I872 Venous insufficiency (chronic) (peripheral): Secondary | ICD-10-CM | POA: Diagnosis not present

## 2019-07-18 DIAGNOSIS — Z9861 Coronary angioplasty status: Secondary | ICD-10-CM

## 2019-07-18 DIAGNOSIS — I2510 Atherosclerotic heart disease of native coronary artery without angina pectoris: Secondary | ICD-10-CM

## 2019-07-18 MED ORDER — TRELEGY ELLIPTA 100-62.5-25 MCG/INH IN AEPB
1.0000 | INHALATION_SPRAY | Freq: Every day | RESPIRATORY_TRACT | 0 refills | Status: DC
Start: 2019-07-18 — End: 2019-07-18

## 2019-07-18 MED ORDER — TRELEGY ELLIPTA 100-62.5-25 MCG/INH IN AEPB: 1.0000 | INHALATION_SPRAY | Freq: Every day | RESPIRATORY_TRACT | 5 refills | Status: AC

## 2019-07-18 MED ORDER — TRELEGY ELLIPTA 100-62.5-25 MCG/INH IN AEPB: 1.0000 | INHALATION_SPRAY | Freq: Every day | RESPIRATORY_TRACT | 0 refills | Status: DC

## 2019-07-18 MED ORDER — AEROCHAMBER MV MISC: 0 refills | Status: AC

## 2019-07-18 NOTE — Telephone Encounter (Signed)
Called and spoke with pt letting her know that we would give a sample to Dr. Lamonte Sakai for him to bring to her and she verbalized understanding. Rx also sent to pharmacy for pt. Nothing further needed.

## 2019-07-18 NOTE — Assessment & Plan Note (Signed)
Applying another unna boot today to help with swelling, bullae, and ulceration.

## 2019-07-18 NOTE — Progress Notes (Signed)
    SUBJECTIVE:   CHIEF COMPLAINT / HPI:   F/U for hemorrhagic bulla on right ankle: Her hemorrhagic bulla on R ankle has popped and has been oozing. She came in last week to have her unna boot removed and did not require another unna boot at that time. Overall she feels that her bulla has been healing well but has been oozing. Additionally she endorses some clear liquid oozing from a bruise on the back of her L leg. R leg has more edema than L leg.  Left Wrist pain She endorses sudden onset of pain of the L wrist and dorsum of L hand. Bending her fingers elicits further pain. She does not recall any trauma or injury to the hand. She did not move around much yesterday so she does not believe that she increased weight of pressure on her hand with use of her walker. She endorses sudden onset of this pain upon waking up this morning and believes she may have slept on it wrong. No swelling or bruising.   PERTINENT  PMH / PSH: venous insufficiency of right leg  OBJECTIVE:   BP 120/76   Pulse 67   Ht 5\' 5"  (1.651 m)   Wt 170 lb (77.1 kg)   SpO2 92%   BMI 28.29 kg/m   Physical Exam Constitutional:      Appearance: Normal appearance.  Musculoskeletal:     Left lower leg: Edema present.     Comments: 2+ pitting edema of R leg, 1+ pitting edema of L leg. 1 inch diameter hemorrhagic ulceration overlying R ankle. Mild bruising over calf of L leg, though no skin opening noted.   Left wrist pain, especially with bending of her fingers. No pain with passive bending of her fingers. No pain with passive movement of her wrist. No swelling or bruising noted on exam  Skin:    General: Skin is warm.  Neurological:     Mental Status: She is alert.      ASSESSMENT/PLAN:   Venous insufficiency of right leg Applying another unna boot today to help with swelling, bullae, and ulceration.    Left wrist pain The sudden onset indicates that this may due to the way she slept on her wrist last night.  Will continue to monitor for now. Recommended monitoring the pain, resting and icing as needed for now. As she has no swelling, bruising, signs or mechanism of injury there is no need for splinting or imaging at this time.   [place]

## 2019-07-18 NOTE — Progress Notes (Addendum)
I have interviewed and examined the patient.  I have discussed the case with MS IV Cornea.   I agree with their documentation and management in their note for today.

## 2019-07-19 ENCOUNTER — Other Ambulatory Visit: Payer: Self-pay

## 2019-07-19 ENCOUNTER — Emergency Department (HOSPITAL_COMMUNITY): Payer: Medicare Other

## 2019-07-19 ENCOUNTER — Encounter (HOSPITAL_COMMUNITY): Payer: Self-pay | Admitting: Emergency Medicine

## 2019-07-19 ENCOUNTER — Emergency Department (HOSPITAL_COMMUNITY)
Admission: EM | Admit: 2019-07-19 | Discharge: 2019-07-19 | Disposition: A | Discharge status: Home / Self Care | Payer: Medicare Other | Attending: Emergency Medicine | Admitting: Emergency Medicine

## 2019-07-19 DIAGNOSIS — I5030 Unspecified diastolic (congestive) heart failure: Secondary | ICD-10-CM | POA: Insufficient documentation

## 2019-07-19 DIAGNOSIS — Z7901 Long term (current) use of anticoagulants: Secondary | ICD-10-CM | POA: Diagnosis not present

## 2019-07-19 DIAGNOSIS — R2232 Localized swelling, mass and lump, left upper limb: Secondary | ICD-10-CM | POA: Diagnosis not present

## 2019-07-19 DIAGNOSIS — I2510 Atherosclerotic heart disease of native coronary artery without angina pectoris: Secondary | ICD-10-CM | POA: Diagnosis not present

## 2019-07-19 DIAGNOSIS — Z79899 Other long term (current) drug therapy: Secondary | ICD-10-CM | POA: Diagnosis not present

## 2019-07-19 DIAGNOSIS — M79642 Pain in left hand: Secondary | ICD-10-CM | POA: Insufficient documentation

## 2019-07-19 DIAGNOSIS — I110 Hypertensive heart disease with heart failure: Secondary | ICD-10-CM | POA: Insufficient documentation

## 2019-07-19 DIAGNOSIS — M19042 Primary osteoarthritis, left hand: Secondary | ICD-10-CM | POA: Diagnosis not present

## 2019-07-19 DIAGNOSIS — I480 Paroxysmal atrial fibrillation: Secondary | ICD-10-CM | POA: Diagnosis not present

## 2019-07-19 DIAGNOSIS — M1990 Unspecified osteoarthritis, unspecified site: Secondary | ICD-10-CM | POA: Insufficient documentation

## 2019-07-19 DIAGNOSIS — M19049 Primary osteoarthritis, unspecified hand: Secondary | ICD-10-CM

## 2019-07-19 LAB — CBC WITH DIFFERENTIAL/PLATELET
Abs Immature Granulocytes: 0.03 10*3/uL (ref 0.00–0.07)
Basophils Absolute: 0 10*3/uL (ref 0.0–0.1)
Basophils Relative: 0 %
Eosinophils Absolute: 0 10*3/uL (ref 0.0–0.5)
Eosinophils Relative: 0 %
HCT: 34.5 % — ABNORMAL LOW (ref 36.0–46.0)
Hemoglobin: 10.1 g/dL — ABNORMAL LOW (ref 12.0–15.0)
Immature Granulocytes: 0 %
Lymphocytes Relative: 9 %
Lymphs Abs: 0.6 10*3/uL — ABNORMAL LOW (ref 0.7–4.0)
MCH: 28.4 pg (ref 26.0–34.0)
MCHC: 29.3 g/dL — ABNORMAL LOW (ref 30.0–36.0)
MCV: 96.9 fL (ref 80.0–100.0)
Monocytes Absolute: 1 10*3/uL (ref 0.1–1.0)
Monocytes Relative: 14 %
Neutro Abs: 5.4 10*3/uL (ref 1.7–7.7)
Neutrophils Relative %: 77 %
Platelets: 233 10*3/uL (ref 150–400)
RBC: 3.56 MIL/uL — ABNORMAL LOW (ref 3.87–5.11)
RDW: 15.1 % (ref 11.5–15.5)
WBC: 7 10*3/uL (ref 4.0–10.5)
nRBC: 0 % (ref 0.0–0.2)

## 2019-07-19 LAB — BASIC METABOLIC PANEL
Anion gap: 10 (ref 5–15)
BUN: 29 mg/dL — ABNORMAL HIGH (ref 8–23)
CO2: 24 mmol/L (ref 22–32)
Calcium: 9.1 mg/dL (ref 8.9–10.3)
Chloride: 104 mmol/L (ref 98–111)
Creatinine, Ser: 1.52 mg/dL — ABNORMAL HIGH (ref 0.44–1.00)
GFR calc Af Amer: 34 mL/min — ABNORMAL LOW (ref >60)
GFR calc non Af Amer: 29 mL/min — ABNORMAL LOW (ref >60)
Glucose, Bld: 110 mg/dL — ABNORMAL HIGH (ref 70–99)
Potassium: 5.4 mmol/L — ABNORMAL HIGH (ref 3.5–5.1)
Sodium: 138 mmol/L (ref 135–145)

## 2019-07-19 MED ORDER — ONDANSETRON 4 MG PO TBDP
4.0000 mg | ORAL_TABLET | Freq: Once | ORAL | Status: AC
  Administered 2019-07-19: 4 mg via ORAL
  Filled 2019-07-19: qty 1

## 2019-07-19 MED ORDER — PREDNISONE 20 MG PO TABS
60.0000 mg | ORAL_TABLET | Freq: Once | ORAL | Status: AC
  Administered 2019-07-19: 60 mg via ORAL
  Filled 2019-07-19: qty 3

## 2019-07-19 MED ORDER — HYDROCODONE-ACETAMINOPHEN 5-325 MG PO TABS
1.0000 | ORAL_TABLET | Freq: Once | ORAL | Status: AC
  Administered 2019-07-19: 1 via ORAL
  Filled 2019-07-19: qty 1

## 2019-07-19 MED ORDER — ONDANSETRON 4 MG PO TBDP
ORAL_TABLET | ORAL | 0 refills | Status: DC
Start: 2019-07-19 — End: 2019-08-26

## 2019-07-19 MED ORDER — HYDROCODONE-ACETAMINOPHEN 5-325 MG PO TABS: 1.0000 | ORAL_TABLET | ORAL | 0 refills | Status: DC | PRN

## 2019-07-19 MED ORDER — PREDNISONE 20 MG PO TABS
ORAL_TABLET | ORAL | 0 refills | Status: DC
Start: 2019-07-19 — End: 2019-08-26

## 2019-07-19 NOTE — Discharge Instructions (Signed)
Take Norco for severe pain.  Please take Zofran with it.  Take prednisone as prescribed   Use the splint for comfort.  You have arthritis and please follow-up with an orthopedic doctor.  Return to ER if you have worse hand swelling or pain, fingers turning blue

## 2019-07-19 NOTE — ED Triage Notes (Signed)
Patient in [place] with daughter. Reports L hand pain onset this AM. Denies injury but concerned she has fracture. Hand appears swollen, red, warm to touch around wrist/knuckles.  Spoke with [place] PA. Imaging first, if negative order labs.

## 2019-07-19 NOTE — ED Provider Notes (Signed)
[facility] EMERGENCY DEPARTMENT Provider Note   CSN: [id number] Arrival date & time: 07/19/19  1714     History Chief Complaint  Patient presents with  . Hand Pain    [name] is a 86 y.o. female hx of afib on eliquis, COPD, arthritis, here presenting with left hand pain.  Patient states that she has left hand pain since yesterday.  She initially denies any injury but then she said that she may have slipped and fell on outstretched hand earlier today.  Denies any other injuries. family noticed some swelling of the left hand.  Patient denies any history of lupus or rheumatoid arthritis.  Patient took some tramadol with no relief.  The history is provided by the patient.       Past Medical History:  Diagnosis Date  . Abnormal mammogram, unspecified 08/23/2010   Followup imaging reassuring.   . Acute appendicitis with rupture   . Acute on chronic diastolic heart failure ([place]) 10/16/2017  . ADENOMATOUS COLONIC POLYP 03/01/2003   Qualifier: Diagnosis of  By: McDiarmid MD, Sherren Mocha Multiple benign polyps of cecum, ascending, transverse and sigmoid colon by 8/09 colonoscopy by Dr Cristina Gong  Colonoscopy by Dr Cristina Gong for iron-deficiency anemia on 04/27/2010 showed three sessile polyps that were in ascending (3 mm x 9 mm), transverse (4 mm), and cecum (3 mm).  All three were tubular adenomas that were negative for high grade dysplasia or malignancy on pathology. Dr Cristina Gong called the polpys benign and not requiring follow-up in view of the patients age.    . Adrenal adenoma    Incidentaloma  . AF (paroxysmal atrial fibrillation) ([place]) 11/11/2010   Hospitalization (9/8-9/10, Dr Daneen Schick, III, Cardiology) for Paroxysmal Atrial Fibrillation with RVR and anginal pain secondary to demand/supply mismatch in setting of RVR with known circumflex artery branch disease.    Marland Kitchen AKI (acute kidney injury) ([place])   . ANXIETY 04/27/2006   Qualifier: Diagnosis of  By: McDiarmid MD, Sherren Mocha    .  Benign essential tremor 02/04/2016  . Candidiasis of the esophagus 11/26/2007  . CAP (community acquired pneumonia) 02/25/2015  . Cataract 2013   Bilateral   . Cellulitis of leg, right 10/01/2012  . Cellulitis of right lower extremity   . Chest pain with moderate risk of acute coronary syndrome 05/21/2015  . Chronic kidney disease (CKD), stage III (moderate)   . Concussion with loss of consciousness   . COPD 04/27/2006   Qualifier: Diagnosis of  By: McDiarmid MD, Sherren Mocha    . COPD exacerbation ([place])   . COPD, severe   . Decreased functional mobility and endurance 03/17/2015  . Dementia [facility]), possible Alzhemier's type 10/26/2012   Montreal Cognitive Assessment (25 out of 30, points off in visuospatial and and short-term memory).  Independent in iADLs and ADLs.    . Diastolic heart failure ([place]) 07/30/2015  . DISC WITH RADICULOPATHY 04/27/2006   Qualifier: Diagnosis of  By: McDiarmid MD, Sherren Mocha    . Dyspnea   . EDEMA-LEGS,DUE TO VENOUS OBSTRUCT. 04/27/2006   Qualifier: Diagnosis of  By: McDiarmid MD, Sherren Mocha    . Fall 10/27/2016  . Fracture of 5th metatarsal, Right, avulsion fx of tip styloid 11/09/2017  . GERD (gastroesophageal reflux disease) 11/08/2015  . 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 Normal uric acid serum level at time of attack.    . Hand trauma, right, initial encounter 03/20/2018  .  HERNIA, HIATAL, NONCONGENITAL 04/27/2006   Qualifier: Diagnosis of  By: McDiarmid MD, Sherren Mocha    . High risk medications (not anticoagulants) long-term use 03/05/2012  . History of Hemorrhoids 04/27/2006   Qualifier: Diagnosis of  By: McDiarmid MD, Sherren Mocha    . History of iron deficiency 01/16/2015  . History of Iron deficiency anemia due to chronic GI blood loss 08/05/2010   Dr Cristina Gong (GI) has evaluated with EGD, colonoscopy, and video capsular endoscopy in 2011 & 2012.  All have been unrevealing as to an origin of IDA.  OV with Dr Cristina Gong  (10/28/10) assessment of blood in stool per hemoccult and GER. Hbg 12.1 g/dL, MCV 91.8, Ferritin 30 ng/mL. Patient taking on ferrous sulfate tab daily.   EGD on 03/06/12 by Dr Cristina Gong for IDA non-obstructing Schatzki's ring at [place]  . Hx of colonoscopy with polypectomy 04/27/2010   Dr Cristina Gong found three  tubular adenomas each less than 10 mm size  . Hypertension   . Hypotension 07/09/2015  . Impaired mobility and ADLs 09/01/2017  . Iron deficiency anemia 08/05/2010   Dr Cristina Gong (GI) has evaluated with EGD, colonoscopy, and video capsular endoscopy in 2011 & 2012.  All have been unrevealing as to an origin of IDA.  OV with Dr Cristina Gong (10/28/10) assessment of blood in stool per hemoccult and GER. Hbg 12.1 g/dL, MCV 91.8, Ferritin 30 ng/mL. Patient taking on ferrous sulfate tab daily.   EGD on 03/06/12 by Dr Cristina Gong for IDA non-obstructing Schatzki's ring at Gastroesophageal junction, otherwise normal esophagus and stomach.     . Laceration of arm, unspecified laterality, initial encounter 11/03/2017  . Left leg cellulitis   . Left Metatarsal fracture, 5th, avulsion styloid 12/29/2017  . Leg cramps 05/29/2012  . Low back pain   . Lumbar herniated disc    History of HNP L4/5 in 2003  . Macular degeneration, bilateral 10/04/2010   Right eye is wet MD, the other is dry macular degeneration (ARMD). Pt undergoing some form of vascular endothelial growth factor inhibition intraocular therapy.    . Mild cognitive impairment 10/26/2012   (10/25/12) Failed MiniCog screen  . MUSCLE CRAMPS 03/11/2010   Qualifier: Diagnosis of  By: McDiarmid MD, Sherren Mocha    . Muscle spasm of back 09/24/2013  . Myocardial infarct, old   . Nocturia 10/28/2016  . Numbness and tingling in hands 07/21/2011  . Pacemaker    MDT  . Paroxysmal atrial fibrillation (HCC)   . Persistent atrial fibrillation ([place]) 05/31/2014  . PREDIABETES 09/11/2007   Qualifier: Diagnosis of  By: McDiarmid MD, Sherren Mocha    . Retinal hemorrhage of left eye 06/2010  . RHINITIS,  ALLERGIC 04/27/2006   Qualifier: Diagnosis of  By: McDiarmid MD, Sherren Mocha    . SCHATZKI'S RING, HX OF 11/26/2007   Qualifier: Diagnosis of  By: McDiarmid MD, Sherren Mocha  An EGD was performed by Dr Cristina Gong on 04/27/2010 for iron deficiency anemia. There was a a transient hiatal hernia with Schatzki's ring. Stomach and duodenum were normal. EGD on 03/06/12 by Dr Cristina Gong for IDA non-obstructing Schatzki's ring at Gastroesophageal junction, otherwise normal esophagus and stomach.    . Seborrheic keratosis, right anterior thigh 12/12/2013  . Shoulder pain, left 01/09/2014  . SICK SINUS SYNDROME 04/27/2006   S/P dual chamber pacemaker placement by Dr Osie Cheeks (E[facility]) with pacemaker lead extraction & reinsertion - 07/25/2003  Hospitalization (9/8-9/10, Dr Daneen Schick, III, Cardiology) for Paroxysmal Atrial Fibrillation with RVR and anginal pain secondary to demand/supply mismatch in setting of RVR  with known circumflex artery branch disease.    . Sick sinus syndrome with tachycardia ([place])    MDT  . Soft tissue injury of foot 05/03/2011  . Solar lentigo 06/15/2012  . Solitary kidney, acquired 05/17/2010   Surgical removal for transitional cell cancer by Tresa Endo, MD ([place]). Surveillance cystoscopy by Dr Alinda Money [facility]) on 10/19/12 without evidence of cystoscopic recurrence. Recommend RTC one year for cystoscopy.   Marland Kitchen Spinal stenosis, lumbar   . Transitional cell carcinoma of ureter, history   . Traumatic ecchymosis of left foot 12/07/2017  . Trigger point with neck pain 10/26/2018  . Urge incontinence 12/13/2011   Diagnosed in 10/2011 by Dr Bjorn Loser (Urology)   . Venous stasis ulcer of ankle, left ([place]) 10/24/2014  . VENTRICULAR HYPERTROPHY, LEFT 08/28/2008   Qualifier: Diagnosis of  By: McDiarmid MD, Sherren Mocha    . VITAMIN B12 DEFICIENCY 10/07/2009   Qualifier: Diagnosis of  By: McDiarmid MD, Sherren Mocha  Dx based on a post-TKR anemia work-up Low normal serum B12 with high Methylmalonic acid and homocysteine  level  Vit B12 serum level (10/28/10) > 1500 pg/mL   . Vitamin D deficiency 11/02/2010   Serum vitamin D 25(OH) = 10.9 ng/mL (30 -100) on 10/28/10 c/w Vitamin D deficiency.     . Wound of left foot 12/29/2017    Patient Active Problem List   Diagnosis Date Noted  . Venous insufficiency of right leg 07/05/2019  . Cystitis 03/19/2019  . History of recent fall 03/19/2019  . Atrial fibrillation ([place]), permanent   . Bilateral leg edema 01/10/2017  . Chronic diastolic CHF (congestive heart failure) ([place]) 11/08/2015  . On amiodarone therapy 09/14/2015  . Pure hypercholesterolemia 07/31/2014  . CAD S/P RCA BMS, residual CFX disease 06/08/2014  . Healthcare maintenance 04/08/2014  . Pacemaker 04/02/2013  . Dementia ([place]), possible Alzhemier's type 10/26/2012  . Chronic anticoagulation 10/01/2012  . Urge incontinence 12/13/2011  . Vitamin D deficiency 11/02/2010  . Macular degeneration, bilateral 10/04/2010    Class: Chronic  . History of Iron deficiency anemia due to chronic GI blood loss 08/05/2010  . Solitary kidney, acquired 05/17/2010  . Spondylolisthesis of lumbar region   . Vitamin B12 deficiency 10/07/2009  . Chronic kidney disease (CKD), stage IV (severe) ([place]) 09/02/2009  . MYOCARDIAL INFARCTION, HX OF 09/25/2008  . VENTRICULAR HYPERTROPHY, LEFT 08/28/2008  . At risk for diabetes mellitus 09/11/2007  . Allergic rhinitis, seasonal 04/27/2006  . COPD, severe ([place]) 04/27/2006  . GASTROESOPHAGEAL REFLUX, NO ESOPHAGITIS 04/27/2006  . Osteoarthrosis involving lower leg 04/27/2006  . LUMBAR SPINAL STENOSIS 04/27/2006    Past Surgical History:  Procedure Laterality Date  . BREAST SURGERY     breast reduction  . CARDIOVERSION N/A 10/20/2017   Procedure: CARDIOVERSION;  Surgeon: Josue Hector, MD;  [facility] ENDOSCOPY;  Service: Cardiovascular;  Laterality: N/A;  . CHOLECYSTECTOMY    . CORONARY ANGIOPLASTY WITH STENT PLACEMENT  2010   BMS RCA, OM2 occluded  . CYSTOSCOPY   09/30/2015   No cystoscopic evidence of uroepithelial neoplasm.  Follow up surveillance cystoscopy in one year  . CYSTOSCOPY  10/13/2017   Dr Raynelle Bring [facility])  . DUPUYTREN CONTRACTURE RELEASE Right 05/22/2014   Procedure: DUPUYTREN RELEASE AND REPAIR AS NECESSARY RIGHT RING FINGER AND MIDDLE FINGER;  Surgeon: Roseanne Kaufman, MD;  Location: [place];  Service: Orthopedics;  Laterality: Right;  . ESOPHAGOGASTRODUODENOSCOPY  04/27/2010   Dr Cristina Gong - found transient H/H & Schatzki's ring  . ESOPHAGOGASTRODUODENOSCOPY ENDOSCOPY  03/06/2012   Dr Buccini - found non-obstuctive Schatzki's ring at [employer] o/w normal EGD.   Marland Kitchen EYE SURGERY     bilateral cataracts  . KNEE ARTHROSCOPY W/ SYNOVECTOMY  11/2009, left knee   Dr Wynelle Link  . NEPHRECTOMY  For transition cell cancer    Dr Tresa Endo, surgeon  . PACEMAKER GENERATOR CHANGE N/A 11/12/2012   Procedure: PACEMAKER GENERATOR CHANGE;  Surgeon: Evans Lance, MD; [employer] [facility] dual-chamber pacemaker serial number [id number]; Laterality: Right  . PACEMAKER INSERTION  2005   Dr Doreatha Lew  . PACEMAKER LEAD REMOVAL  2005   Removal and reinsertion of atrial and ventricular leads due to migration  . REPLACEMENT TOTAL KNEE  2009, Right knee   Dr Wynelle Link  . REPLACEMENT TOTAL KNEE  05/2009, Left knee   Dr Wynelle Link  . TONSILLECTOMY    . TRIGGER FINGER RELEASE Right 05/22/2014   Procedure: RIGHT HAND A-1 PULLEY RELEASE ;  Surgeon: Roseanne Kaufman, MD;  Location: [facility];  Service: Orthopedics;  Laterality: Right;     OB History   No obstetric history on file.     Family History  Problem Relation Age of Onset  . Dementia Mother   . Heart disease Father   . Heart attack Father     Social History   Tobacco Use  . Smoking status: Former Smoker    Packs/day: 1.00    Types: Cigarettes    Quit date: 03/02/2009    Years since quitting: 10.3  . Smokeless tobacco: Never Used  Substance Use Topics  . Alcohol use: Yes    Alcohol/week: 13.0  standard drinks    Types: 7 Glasses of wine, 6 Standard drinks or equivalent per week    Comment: 5 oz white wine per day  . Drug use: No    Home Medications Prior to Admission medications   Medication Sig Start Date End Date Taking? Authorizing Provider  albuterol (PROVENTIL) (2.5 MG/3ML) 0.083% nebulizer solution USE 1 VIAL VIA NEBULIZER EVERY 4 HOURS AS NEEDED FOR WHEEZING OR SHORTNESS OF BREATH 04/16/18   Collene Gobble, MD  albuterol (VENTOLIN HFA) 108 (90 Base) MCG/ACT inhaler Inhale 2 puffs into the lungs every 4 (four) hours as needed for wheezing or shortness of breath. 09/11/18   Collene Gobble, MD  amiodarone (PACERONE) 200 MG tablet Take 1 tablet (200 mg total) by mouth daily. 12/27/18   Belva Crome, MD  atorvastatin (LIPITOR) 20 MG tablet TAKE 1 TABLET BY MOUTH DAILY 03/18/19   McDiarmid, Blane Ohara, MD  ELIQUIS 2.5 MG TABS tablet TAKE 1 TABLET(2.5 MG) BY MOUTH TWICE DAILY 11/23/18   Evans Lance, MD  fluticasone [facility]) 50 MCG/ACT nasal spray SHAKE LIQUID AND USE 2 SPRAYS IN EACH NOSTRIL DAILY 12/04/18   Collene Gobble, MD  Fluticasone-Umeclidin-Vilant (TRELEGY ELLIPTA) 100-62.5-25 MCG/INH AEPB Inhale 1 puff into the lungs daily. 07/18/19   Collene Gobble, MD  furosemide (LASIX) 40 MG tablet TAKE 1/2 TABLET(20 MG) BY MOUTH DAILY AS NEEDED FOR FLUID RETENTION OR SWELLING 02/14/19   McDiarmid, Blane Ohara, MD  metoprolol succinate (TOPROL-XL) 25 MG 24 hr tablet TAKE 1 TABLET BY MOUTH DAILY 07/17/19   Evans Lance, MD  Misc. Devices (3-IN-1 BEDSIDE TOILET) MISC 1 Device by Does not apply route daily. 07/04/19   McDiarmid, Blane Ohara, MD  nitroGLYCERIN (NITROSTAT) 0.4 MG SL tablet Place 1 tablet (0.4 mg total) under the tongue every 5 (five) minutes as needed for chest pain (x 3 tabs).  11/16/15   McDiarmid, Blane Ohara, MD  omeprazole (PRILOSEC) 20 MG capsule TAKE 1 CAPSULE(20 MG) BY MOUTH DAILY 07/18/19   McDiarmid, Blane Ohara, MD  Spacer/Aero-Holding Chambers (AEROCHAMBER MV) inhaler Use as instructed  07/18/19   McDiarmid, Blane Ohara, MD  traMADol (ULTRAM) 50 MG tablet TAKE 1 TABLET BY MOUTH EVERY 6 HOURS AS NEEDED FOR PAIN 06/05/19   McDiarmid, Blane Ohara, MD    Allergies    Baclofen, Codeine phosphate, Hydrochlorothiazide, Montelukast sodium, and Norco [hydrocodone-acetaminophen]  Review of Systems   Review of Systems  Musculoskeletal:       L wrist pain   All other systems reviewed and are negative.   Physical Exam Updated Vital Signs BP (!) 140/97 (BP Location: Right Arm)   Pulse 89   Temp 98.6 F (37 C) (Oral)   Resp 16   Ht 5\' 5"  (1.651 m)   Wt 77.1 kg   SpO2 97%   BMI 28.29 kg/m   Physical Exam Vitals and nursing note reviewed.  Constitutional:      Appearance: Normal appearance.     Comments: Slightly uncomfortable, chronically ill   HENT:     Head: Normocephalic.     Comments: No scalp hematoma     Nose: Nose normal.     Mouth/Throat:     Mouth: Mucous membranes are moist.  Eyes:     Extraocular Movements: Extraocular movements intact.     Pupils: Pupils are equal, round, and reactive to light.  Cardiovascular:     Rate and Rhythm: Normal rate and regular rhythm.     Pulses: Normal pulses.     Heart sounds: Normal heart sounds.  Pulmonary:     Effort: Pulmonary effort is normal.     Breath sounds: Normal breath sounds.  Abdominal:     General: Abdomen is flat.     Palpations: Abdomen is soft.  Musculoskeletal:     Cervical back: Normal range of motion.     Comments: Left hand slightly swollen and mostly swollen at the wrist.  Patient is able to range the wrist.  has 2+ radial pulses and able to hand grasp and has nl capillary refill   Skin:    General: Skin is warm.     Capillary Refill: Capillary refill takes less than 2 seconds.  Neurological:     General: No focal deficit present.     Mental Status: She is alert and oriented to person, place, and time.     Cranial Nerves: No cranial nerve deficit.     Motor: No weakness.  Psychiatric:        Mood and  Affect: Mood normal.        Behavior: Behavior normal.     ED Results / Procedures / Treatments   Labs (all labs ordered are listed, but only abnormal results are displayed) Labs Reviewed  CBC WITH DIFFERENTIAL/PLATELET - Abnormal; Notable for the following components:      Result Value   RBC 3.56 (*)    Hemoglobin 10.1 (*)    HCT 34.5 (*)    MCHC 29.3 (*)    Lymphs Abs 0.6 (*)    All other components within normal limits  BASIC METABOLIC PANEL - Abnormal; Notable for the following components:   Potassium 5.4 (*)    Glucose, Bld 110 (*)    BUN 29 (*)    Creatinine, Ser 1.52 (*)    GFR calc non Af Amer 29 (*)    GFR calc Af  Amer 34 (*)    All other components within normal limits    EKG None  Radiology DG Hand Complete Left  Result Date: 07/19/2019 CLINICAL DATA:  Left hand pain and swelling since last night. EXAM: LEFT HAND - COMPLETE 3+ VIEW COMPARISON:  Wrist radiographs 02/12/2015 FINDINGS: The bones appear diffusely demineralized. There is no evidence of acute fracture, dislocation or bone destruction. Mild degenerative changes are present throughout the interphalangeal joints and the radial aspect of the wrist. No erosive changes are seen. Scapholunate diastasis and TFCC chondrocalcinosis are noted. The soft tissues are diffusely prominent without apparent focal swelling or foreign body. IMPRESSION: No acute osseous findings or evidence of inflammatory arthropathy. Mild degenerative changes and diffuse soft tissue swelling. Electronically Signed   By: Richardean Sale M.D.   On: 07/19/2019 18:33    Procedures Procedures (including critical care time)  Medications Ordered in ED Medications  ondansetron (ZOFRAN-ODT) disintegrating tablet 4 mg (4 mg Oral Given 07/19/19 2203)  HYDROcodone-acetaminophen (NORCO/VICODIN) 5-325 MG per tablet 1 tablet (1 tablet Oral Given 07/19/19 2203)  predniSONE (DELTASONE) tablet 60 mg (60 mg Oral Given 07/19/19 2204)    ED Course  I have  reviewed the triage vital signs and the nursing notes.  Pertinent labs & imaging results that were available during my care of the patient were reviewed by me and considered in my medical decision making (see chart for details).    MDM Rules/Calculators/A&P                      MILANNI AYUB is a 86 y.o. female who presenting with left hand pain and swelling.  Patient may have a fall and may have injured her hand. Patient definitely did not have any signs of head or neck or chest or pelvic injuries.  Leak likely a sprain or pain from arthritis or fracture.  Will get basic blood work and x-ray of the hand.  10:35 PM  Her white blood cell count is normal.  Her potassium is 5.4 but likely hemolyzed specimen.  Her x-ray showed diffuse soft tissue swelling and degenerative changes.  Wonder if she has pain from osteoarthritis or rheumatoid arthritis. We will give a course of steroids and also give short course of pain medicine.  Final Clinical Impression(s) / ED Diagnoses Final diagnoses:  None    Rx / DC Orders ED Discharge Orders    None       Drenda Freeze, MD 07/19/19 2237

## 2019-07-22 DIAGNOSIS — H353211 Exudative age-related macular degeneration, right eye, with active choroidal neovascularization: Secondary | ICD-10-CM | POA: Diagnosis not present

## 2019-07-22 DIAGNOSIS — H353231 Exudative age-related macular degeneration, bilateral, with active choroidal neovascularization: Secondary | ICD-10-CM | POA: Diagnosis not present

## 2019-07-26 ENCOUNTER — Ambulatory Visit: Payer: Medicare Other

## 2019-07-26 ENCOUNTER — Ambulatory Visit (INDEPENDENT_AMBULATORY_CARE_PROVIDER_SITE_OTHER): Payer: Medicare Other | Admitting: Family Medicine

## 2019-07-26 ENCOUNTER — Other Ambulatory Visit: Payer: Self-pay

## 2019-07-26 VITALS — HR 80 | Temp 97.7°F | Wt 168.0 lb

## 2019-07-26 DIAGNOSIS — I2510 Atherosclerotic heart disease of native coronary artery without angina pectoris: Secondary | ICD-10-CM

## 2019-07-26 DIAGNOSIS — L97929 Non-pressure chronic ulcer of unspecified part of left lower leg with unspecified severity: Secondary | ICD-10-CM | POA: Diagnosis not present

## 2019-07-26 DIAGNOSIS — Z9861 Coronary angioplasty status: Secondary | ICD-10-CM

## 2019-07-26 DIAGNOSIS — I872 Venous insufficiency (chronic) (peripheral): Secondary | ICD-10-CM | POA: Diagnosis not present

## 2019-07-26 DIAGNOSIS — I83029 Varicose veins of left lower extremity with ulcer of unspecified site: Secondary | ICD-10-CM

## 2019-07-26 DIAGNOSIS — L97919 Non-pressure chronic ulcer of unspecified part of right lower leg with unspecified severity: Secondary | ICD-10-CM | POA: Diagnosis not present

## 2019-07-26 DIAGNOSIS — I83019 Varicose veins of right lower extremity with ulcer of unspecified site: Secondary | ICD-10-CM

## 2019-07-26 NOTE — Progress Notes (Signed)
    SUBJECTIVE:   CHIEF COMPLAINT / HPI:   Venous insufficiency: Patient has a longstanding history of venous insufficiency which she has been slowly and steadily treating with Unna boots.  She is also prescribed Lasix 20 mg as needed for swelling and weight loss, however the patient only has 1 kidney so she is discouraged from taking this medication very often.  Today the patient cannot tell me exactly when she last took her Lasix, "I think I have mixed up my meds recently, my daughter and I are going to check it at home".  Additionally, the patient reports that she slipped while walking last week and scraped a large portion of her left lower extremity (see photos below).  She denies any chest pain, worsening shortness of breath, fevers, body aches, chills, or new lower extremity pains.  PERTINENT  PMH / PSH:  Venous insufficiency  OBJECTIVE:   Pulse 80   Temp 97.7 F (36.5 C) (Oral)   Wt 168 lb (76.2 kg)   SpO2 91%   BMI 27.96 kg/m    Physical Exam: General: well appearing, pleasant patient Resp: CTA bilaterally, no crackles or wheezes appreciated to lung exam Extremities: 2+ pitting edema appreciated to bilateral lower extremities, wound appreciated to right medial malleolus measures about 1 inch in diameter (unchanged from prior exam), new wound appreciated to patient's left lateral calf/shin; brisk capillary refill appreciated bilateral lower extremities with 2+ DP pulses appreciated      Right lower extremity  - 3+ pitting edema             Left lower extremity - 2+ pitting edema  Left lateral calf        Medial Right ankle   Right medial calf (bulla)   ASSESSMENT/PLAN:   Venous stasis ulcers of both lower extremities ([place]) Patient with slowly healing venous stasis ulcers to bilateral lower extremity, as well as new wound to left lower extremity when patient slipped and scraped her leg last week. -Old Unna boots removed and new Unna boots placed -Referral placed for  home wound care -Referral placed to wound care center -Patient given additional supplies for wound care at home -Patient to follow-up next week with PCP Dr. McDiarmid     Daisy Floro, [place]

## 2019-07-26 NOTE — Assessment & Plan Note (Signed)
Patient with slowly healing venous stasis ulcers to bilateral lower extremity, as well as new wound to left lower extremity when patient slipped and scraped her leg last week. -Old Unna boots removed and new Unna boots placed -Referral placed for home wound care -Referral placed to wound care center -Patient given additional supplies for wound care at home -Patient to follow-up next week with PCP Dr. Wendy Poet

## 2019-07-26 NOTE — Patient Instructions (Signed)
Thank you for coming in to see [place] today! Please see below to review our plan for today's visit:  1. We are referring you to Wound Care at the [facility] as well as additional help with Wound Care and skilled nursing at home. 2. Please come back next week to see Dr. Wendy Poet.   Please call the clinic at [phone] if your symptoms worsen or you have any concerns. It was our pleasure to serve you!     Dr. Milus Banister [facility]

## 2019-07-27 LAB — BASIC METABOLIC PANEL
BUN/Creatinine Ratio: 24 (ref 12–28)
BUN: 48 mg/dL — ABNORMAL HIGH (ref 10–36)
CO2: 23 mmol/L (ref 20–29)
Calcium: 8.5 mg/dL — ABNORMAL LOW (ref 8.7–10.3)
Chloride: 104 mmol/L (ref 96–106)
Creatinine, Ser: 2.01 mg/dL — ABNORMAL HIGH (ref 0.57–1.00)
GFR calc Af Amer: 24 mL/min/{1.73_m2} — ABNORMAL LOW (ref >59)
GFR calc non Af Amer: 21 mL/min/{1.73_m2} — ABNORMAL LOW (ref >59)
Glucose: 105 mg/dL — ABNORMAL HIGH (ref 65–99)
Potassium: 4.9 mmol/L (ref 3.5–5.2)
Sodium: 141 mmol/L (ref 134–144)

## 2019-08-02 DIAGNOSIS — H3530 Unspecified macular degeneration: Secondary | ICD-10-CM | POA: Diagnosis not present

## 2019-08-02 DIAGNOSIS — J449 Chronic obstructive pulmonary disease, unspecified: Secondary | ICD-10-CM | POA: Diagnosis not present

## 2019-08-02 DIAGNOSIS — Z4800 Encounter for change or removal of nonsurgical wound dressing: Secondary | ICD-10-CM | POA: Diagnosis not present

## 2019-08-02 DIAGNOSIS — N184 Chronic kidney disease, stage 4 (severe): Secondary | ICD-10-CM | POA: Diagnosis not present

## 2019-08-02 DIAGNOSIS — Z950 Presence of cardiac pacemaker: Secondary | ICD-10-CM | POA: Diagnosis not present

## 2019-08-02 DIAGNOSIS — I2510 Atherosclerotic heart disease of native coronary artery without angina pectoris: Secondary | ICD-10-CM | POA: Diagnosis not present

## 2019-08-02 DIAGNOSIS — Z7901 Long term (current) use of anticoagulants: Secondary | ICD-10-CM | POA: Diagnosis not present

## 2019-08-02 DIAGNOSIS — W1849XD Other slipping, tripping and stumbling without falling, subsequent encounter: Secondary | ICD-10-CM | POA: Diagnosis not present

## 2019-08-02 DIAGNOSIS — S81802D Unspecified open wound, left lower leg, subsequent encounter: Secondary | ICD-10-CM | POA: Diagnosis not present

## 2019-08-02 DIAGNOSIS — I872 Venous insufficiency (chronic) (peripheral): Secondary | ICD-10-CM | POA: Diagnosis not present

## 2019-08-02 DIAGNOSIS — M48061 Spinal stenosis, lumbar region without neurogenic claudication: Secondary | ICD-10-CM | POA: Diagnosis not present

## 2019-08-02 DIAGNOSIS — Z79891 Long term (current) use of opiate analgesic: Secondary | ICD-10-CM | POA: Diagnosis not present

## 2019-08-02 DIAGNOSIS — Z905 Acquired absence of kidney: Secondary | ICD-10-CM | POA: Diagnosis not present

## 2019-08-02 DIAGNOSIS — Z7952 Long term (current) use of systemic steroids: Secondary | ICD-10-CM | POA: Diagnosis not present

## 2019-08-02 DIAGNOSIS — F0390 Unspecified dementia without behavioral disturbance: Secondary | ICD-10-CM | POA: Diagnosis not present

## 2019-08-02 DIAGNOSIS — I4891 Unspecified atrial fibrillation: Secondary | ICD-10-CM | POA: Diagnosis not present

## 2019-08-02 DIAGNOSIS — I5032 Chronic diastolic (congestive) heart failure: Secondary | ICD-10-CM | POA: Diagnosis not present

## 2019-08-02 DIAGNOSIS — L97819 Non-pressure chronic ulcer of other part of right lower leg with unspecified severity: Secondary | ICD-10-CM | POA: Diagnosis not present

## 2019-08-05 ENCOUNTER — Telehealth: Payer: Self-pay

## 2019-08-05 DIAGNOSIS — W1849XD Other slipping, tripping and stumbling without falling, subsequent encounter: Secondary | ICD-10-CM | POA: Diagnosis not present

## 2019-08-05 DIAGNOSIS — L97819 Non-pressure chronic ulcer of other part of right lower leg with unspecified severity: Secondary | ICD-10-CM | POA: Diagnosis not present

## 2019-08-05 DIAGNOSIS — I872 Venous insufficiency (chronic) (peripheral): Secondary | ICD-10-CM | POA: Diagnosis not present

## 2019-08-05 DIAGNOSIS — I2510 Atherosclerotic heart disease of native coronary artery without angina pectoris: Secondary | ICD-10-CM | POA: Diagnosis not present

## 2019-08-05 DIAGNOSIS — S81802D Unspecified open wound, left lower leg, subsequent encounter: Secondary | ICD-10-CM | POA: Diagnosis not present

## 2019-08-05 DIAGNOSIS — I5032 Chronic diastolic (congestive) heart failure: Secondary | ICD-10-CM | POA: Diagnosis not present

## 2019-08-05 NOTE — Telephone Encounter (Signed)
Amy from [facility] calling for wound care nursing verbal orders as follows:  3 time(s) weekly for 3 week(s), then 2 time(s) weekly for 3 week(s),  Then once for three weeks  RN would like to use 3 layer compression dressing with xeroform gauze.   You can leave verbal orders on confidential voicemail at [place], RN

## 2019-08-06 ENCOUNTER — Other Ambulatory Visit: Payer: Self-pay | Admitting: Family Medicine

## 2019-08-06 DIAGNOSIS — L03116 Cellulitis of left lower limb: Secondary | ICD-10-CM

## 2019-08-06 MED ORDER — DOXYCYCLINE HYCLATE 100 MG PO TABS: 100.0000 mg | ORAL_TABLET | Freq: Two times a day (BID) | ORAL | 0 refills | Status: DC

## 2019-08-06 NOTE — Progress Notes (Unsigned)
Possible suppurative traumatic wound to [name]'s left leg per Amy, wound care nurse from [place].   Rx Doxycycline 100 mg BID PO for suppurative skin and soft tissue infection of left leg Frequent HH nurse evaluations  3-layer compression wraps of left leg with Xerofoam guaze

## 2019-08-06 NOTE — Telephone Encounter (Signed)
Called in wound care nursing verbal orders to Amy from [place] as follows:   3 time(s) weekly for 3 week(s), then 2 time(s) weekly for 3 week(s),  Then once for three weeks   RN would like to use 3 layer compression dressing with xeroform gauze.    You can leave verbal orders on confidential voicemail at [phone]  Amy reported Ms Fenlon's left leg wound is showing a pus-like drainage and is warm to the touch suggesting a suppurative infection.   Prescription for Doxycycline 100 mg tablets BID for 7 days sent into [facility] pharmacy.  Amy said she would let Ms Arteaga know of the prescription.

## 2019-08-07 DIAGNOSIS — I872 Venous insufficiency (chronic) (peripheral): Secondary | ICD-10-CM | POA: Diagnosis not present

## 2019-08-07 DIAGNOSIS — I2510 Atherosclerotic heart disease of native coronary artery without angina pectoris: Secondary | ICD-10-CM | POA: Diagnosis not present

## 2019-08-07 DIAGNOSIS — W1849XD Other slipping, tripping and stumbling without falling, subsequent encounter: Secondary | ICD-10-CM | POA: Diagnosis not present

## 2019-08-07 DIAGNOSIS — L97819 Non-pressure chronic ulcer of other part of right lower leg with unspecified severity: Secondary | ICD-10-CM | POA: Diagnosis not present

## 2019-08-07 DIAGNOSIS — S81802D Unspecified open wound, left lower leg, subsequent encounter: Secondary | ICD-10-CM | POA: Diagnosis not present

## 2019-08-07 DIAGNOSIS — I5032 Chronic diastolic (congestive) heart failure: Secondary | ICD-10-CM | POA: Diagnosis not present

## 2019-08-09 DIAGNOSIS — S81802D Unspecified open wound, left lower leg, subsequent encounter: Secondary | ICD-10-CM | POA: Diagnosis not present

## 2019-08-09 DIAGNOSIS — I2510 Atherosclerotic heart disease of native coronary artery without angina pectoris: Secondary | ICD-10-CM | POA: Diagnosis not present

## 2019-08-09 DIAGNOSIS — L97819 Non-pressure chronic ulcer of other part of right lower leg with unspecified severity: Secondary | ICD-10-CM | POA: Diagnosis not present

## 2019-08-09 DIAGNOSIS — I872 Venous insufficiency (chronic) (peripheral): Secondary | ICD-10-CM | POA: Diagnosis not present

## 2019-08-09 DIAGNOSIS — W1849XD Other slipping, tripping and stumbling without falling, subsequent encounter: Secondary | ICD-10-CM | POA: Diagnosis not present

## 2019-08-09 DIAGNOSIS — I5032 Chronic diastolic (congestive) heart failure: Secondary | ICD-10-CM | POA: Diagnosis not present

## 2019-08-12 ENCOUNTER — Telehealth: Payer: Self-pay | Admitting: Interventional Cardiology

## 2019-08-12 DIAGNOSIS — W1849XD Other slipping, tripping and stumbling without falling, subsequent encounter: Secondary | ICD-10-CM | POA: Diagnosis not present

## 2019-08-12 DIAGNOSIS — I872 Venous insufficiency (chronic) (peripheral): Secondary | ICD-10-CM | POA: Diagnosis not present

## 2019-08-12 DIAGNOSIS — L97819 Non-pressure chronic ulcer of other part of right lower leg with unspecified severity: Secondary | ICD-10-CM | POA: Diagnosis not present

## 2019-08-12 DIAGNOSIS — I5032 Chronic diastolic (congestive) heart failure: Secondary | ICD-10-CM | POA: Diagnosis not present

## 2019-08-12 DIAGNOSIS — S81802D Unspecified open wound, left lower leg, subsequent encounter: Secondary | ICD-10-CM | POA: Diagnosis not present

## 2019-08-12 DIAGNOSIS — I2510 Atherosclerotic heart disease of native coronary artery without angina pectoris: Secondary | ICD-10-CM | POA: Diagnosis not present

## 2019-08-12 NOTE — Telephone Encounter (Signed)
New Message  Pts daughter is calling and is requesting for Dr Darliss Ridgel nurse to call her   Pt c/o swelling: STAT is pt has developed SOB within 24 hours  1) How much weight have you gained and in what time span? She fluctuates, pts daughter doesn't know if she has gained any weight   2) If swelling, where is the swelling located? Both legs, fluid are coming out of them. Her bed will be wet. And pt is experiencing pain   3) Are you currently taking a fluid pill? No because they dont want pt taking it   4) Are you currently SOB? She has COPD and always has SOB   5) Do you have a log of your daily weights (if so, list)? No   6) Have you gained 3 pounds in a day or 5 pounds in a week? Unsure   7) Have you traveled recently? No

## 2019-08-12 NOTE — Telephone Encounter (Signed)
Returned call to patient's daughter, [name], who states she is calling to touch base with Dr. [name] to discuss the extreme leg swelling patient is having. She states PCP, Dr. McDiarmid, is managing patient's leg swelling and has ordered home health but family is concerned that patient's heart function is worsening and think she needs an echo. She ambulates very little through the day. Does not have the ability to walk very far without difficulty due to balance issues and COPD. Daughter states HHN is calling PCP to request a couple of doses of lasix to help relieve pressure in legs but patient has CKD so she cannot take long-term diuretics. Family would like patient to be seen by Dr. [name] soon. I advised that he will be out of the office this week and that I will forward request to him for follow-up when he returns. I advised patient can be seen by another provider in the office and daughter states they will wait for Dr. [name]. I advised her to look at the loungedoctor leg elevation device online as it might be helpful for them to order. I advised her to call back sooner with questions or concerns and she thanked me for the call.

## 2019-08-14 DIAGNOSIS — I5032 Chronic diastolic (congestive) heart failure: Secondary | ICD-10-CM | POA: Diagnosis not present

## 2019-08-14 DIAGNOSIS — I872 Venous insufficiency (chronic) (peripheral): Secondary | ICD-10-CM | POA: Diagnosis not present

## 2019-08-14 DIAGNOSIS — I2510 Atherosclerotic heart disease of native coronary artery without angina pectoris: Secondary | ICD-10-CM | POA: Diagnosis not present

## 2019-08-14 DIAGNOSIS — S81802D Unspecified open wound, left lower leg, subsequent encounter: Secondary | ICD-10-CM | POA: Diagnosis not present

## 2019-08-14 DIAGNOSIS — L97819 Non-pressure chronic ulcer of other part of right lower leg with unspecified severity: Secondary | ICD-10-CM | POA: Diagnosis not present

## 2019-08-14 DIAGNOSIS — W1849XD Other slipping, tripping and stumbling without falling, subsequent encounter: Secondary | ICD-10-CM | POA: Diagnosis not present

## 2019-08-16 ENCOUNTER — Encounter: Payer: Self-pay | Admitting: Family Medicine

## 2019-08-16 ENCOUNTER — Ambulatory Visit (INDEPENDENT_AMBULATORY_CARE_PROVIDER_SITE_OTHER): Payer: Medicare Other | Admitting: Family Medicine

## 2019-08-16 ENCOUNTER — Other Ambulatory Visit: Payer: Self-pay

## 2019-08-16 ENCOUNTER — Telehealth: Payer: Self-pay | Admitting: Nurse Practitioner

## 2019-08-16 VITALS — BP 124/76 | HR 79 | Wt 175.2 lb

## 2019-08-16 DIAGNOSIS — I2510 Atherosclerotic heart disease of native coronary artery without angina pectoris: Secondary | ICD-10-CM

## 2019-08-16 DIAGNOSIS — I5032 Chronic diastolic (congestive) heart failure: Secondary | ICD-10-CM

## 2019-08-16 DIAGNOSIS — Z9861 Coronary angioplasty status: Secondary | ICD-10-CM | POA: Diagnosis not present

## 2019-08-16 DIAGNOSIS — R262 Difficulty in walking, not elsewhere classified: Secondary | ICD-10-CM

## 2019-08-16 NOTE — Patient Instructions (Signed)
Today we talked about your concern with your ability to stand up and walk around on your own without having to push off using your arms.  I agree that you seem to be functioning pretty well mentally and I want to support your goal of living independently as much as possible.  I think that free to do that safely it will be necessary for you to be able to reliably and safely stand up.  Were going to refer you to physical therapy, I have put in the notes that you work with Shanon Brow from [place] in the past and that you might like to reestablish with them.  I am not sure who they will set you up with but it is important that you work on your strength and incorporate standing up as a regular workout routine in order to maintain that ability.  I have ran this plan by Dr. Wendy Poet and he is aware.  Dr. Criss Rosales

## 2019-08-16 NOTE — Telephone Encounter (Signed)
Received call from patient's daughter, [name], stating that she thought they would hear back from [place] by the end of the week for an appointment or to get an echo. I apologized and advised that I thought I communicated that Dr. [name] and his RN, Anderson Malta are out of the office until next week. I advised that I am unable to [facility] Dr. Thompson Caul schedule but I can watch for cancellations, which there are none at present. [name] states patient has got to have some relief. I asked if home health is wrapping patient's legs and how much Lasix she has taken. [name] states she does not know if pt has received any Lasix because of her kidney function and nephrectomy for kidney cancer (last creatinine on 5/28 was 2.01).  I asked about worsening SOB and she states pt has difficulty walking short distances and contributes this to SOB as well as leg pain. I asked about PCP orders for pt and daughter states they advised that pt see cardiologist. PCP has great pictures in patient's chart from 5/28. We discussed venous insufficiency and the challenges with leg swelling, particularly for this pt. Pt has an appointment later today with PCP. I advised that pt should continue with management plan per PCP and that I will forward message to Dr. [name] and Anderson Malta, RN for follow-up next week. [name] states she wants to make certain pt gets an echo if needed. I advised that when Dr. [name] reviews pt's chart, our office will call to discuss. [name] verbalized understanding and agreement with plan and thanked me for the call.

## 2019-08-19 ENCOUNTER — Encounter (HOSPITAL_BASED_OUTPATIENT_CLINIC_OR_DEPARTMENT_OTHER): Payer: Medicare Other | Attending: Internal Medicine | Admitting: Internal Medicine

## 2019-08-19 DIAGNOSIS — Z7901 Long term (current) use of anticoagulants: Secondary | ICD-10-CM | POA: Insufficient documentation

## 2019-08-19 DIAGNOSIS — Z87891 Personal history of nicotine dependence: Secondary | ICD-10-CM | POA: Diagnosis not present

## 2019-08-19 DIAGNOSIS — I890 Lymphedema, not elsewhere classified: Secondary | ICD-10-CM | POA: Insufficient documentation

## 2019-08-19 DIAGNOSIS — I509 Heart failure, unspecified: Secondary | ICD-10-CM | POA: Insufficient documentation

## 2019-08-19 DIAGNOSIS — I252 Old myocardial infarction: Secondary | ICD-10-CM | POA: Insufficient documentation

## 2019-08-19 DIAGNOSIS — R262 Difficulty in walking, not elsewhere classified: Secondary | ICD-10-CM | POA: Insufficient documentation

## 2019-08-19 DIAGNOSIS — I2510 Atherosclerotic heart disease of native coronary artery without angina pectoris: Secondary | ICD-10-CM | POA: Diagnosis not present

## 2019-08-19 DIAGNOSIS — L97811 Non-pressure chronic ulcer of other part of right lower leg limited to breakdown of skin: Secondary | ICD-10-CM | POA: Insufficient documentation

## 2019-08-19 DIAGNOSIS — L97311 Non-pressure chronic ulcer of right ankle limited to breakdown of skin: Secondary | ICD-10-CM | POA: Insufficient documentation

## 2019-08-19 DIAGNOSIS — J449 Chronic obstructive pulmonary disease, unspecified: Secondary | ICD-10-CM | POA: Diagnosis not present

## 2019-08-19 DIAGNOSIS — L97821 Non-pressure chronic ulcer of other part of left lower leg limited to breakdown of skin: Secondary | ICD-10-CM | POA: Insufficient documentation

## 2019-08-19 DIAGNOSIS — N184 Chronic kidney disease, stage 4 (severe): Secondary | ICD-10-CM | POA: Diagnosis not present

## 2019-08-19 DIAGNOSIS — I4891 Unspecified atrial fibrillation: Secondary | ICD-10-CM | POA: Insufficient documentation

## 2019-08-19 NOTE — Assessment & Plan Note (Signed)
Patient with slowly increasing inability to stand without pushing off her chair.  We discussed the ability to stand independently is a major marker for the ability to safely live independently which is her primary goal.  Patient consents to physical therapy referral with the understanding that she should begin working, with supervision, on standing as an exercise and work towards the goal of not needing her arms to push off the chair.

## 2019-08-19 NOTE — Progress Notes (Signed)
    SUBJECTIVE:   CHIEF COMPLAINT / HPI: Increased difficulty standing independently  Patient with chronic leg wounds that are healing well, she states that she has been having difficulty standing and walking independently.  Now has to push off from the chair to be able to stand up.  Actually had arm bars installed around her toilet because she was unable to get off the toilet a week ago.  Over the last 2 months has started using a walker to get around, she is very confident in her ability to walk using the walker with no falls.  She does not think that she could safely ambulate without a walker at this point.  She is very protective of her independence and understands after a very frank conversation with me that if she does not the ability to stand reliably independently that she will be unsafe to stay at home alone.  Wound care comes to the house 3 times a week to check her legs which she says are getting better from chronic ulcers  PERTINENT  PMH / PSH:   OBJECTIVE:   BP 124/76   Pulse 79   Wt 175 lb 3.2 oz (79.5 kg)   SpO2 95%   BMI 29.15 kg/m   General: Mentating well, alert, no distress MSK: Leg wrapped with clean bandages, says that wound care came this morning him checked her wounds.  Patient is able to stand only with pushing off of the chair, does have steady gait as long as she is using her walker.  We both have little confidence that she would be steady on her feet without her walker.  She is unable to stand without using her arms to push off.  There is no pain to the hip with internal or external rotation and she has good flexion extension of her knees.  No distal neuro deficits  ASSESSMENT/PLAN:   Ambulatory dysfunction Patient with slowly increasing inability to stand without pushing off her chair.  We discussed the ability to stand independently is a major marker for the ability to safely live independently which is her primary goal.  Patient consents to physical therapy  referral with the understanding that she should begin working, with supervision, on standing as an exercise and work towards the goal of not needing her arms to push off the chair.     Sherene Sires, [place]

## 2019-08-20 NOTE — Telephone Encounter (Signed)
Spoke with daughter and was able to get pt scheduled to come in and see Kathyrn Drown, NP on 6/28.  Daughter states that [place] nurse had mentioned pt may need an updated echo to check pumping function due to recent issues with swelling and SOB.  Advised I will send message to Dr. [name] for review.  Daughter appreciative for call.

## 2019-08-20 NOTE — Telephone Encounter (Signed)
Transferred call to Jennifer.

## 2019-08-20 NOTE — Progress Notes (Deleted)
Cardiology Office Note   Date:  08/20/2019   ID:  [name], DOB 09/26/1923, MRN [id number]  PCP:  McDiarmid, Blane Ohara, MD  Cardiologist: Dr. Daneen Schick, MD   No chief complaint on file.     History of Present Illness: [name] is a 86 y.o. female who presents for the evaluation of LE edema, seen for Dr. [name].  [name] has a history of RCA BM stent, paroxysmal atrial fibrillation with rhythm control and amiodarone therapy, chronic anticoagulation therapy, chronic diastolic heart failure, hypertension and PPM placement for tachybradycardia syndrome followed by Dr. Lovena Le.  Had decompensated acute diastolic heart failure 07/2374 secondary to persistent atrial fibrillation.  She was most recently seen by Dr. [name] 12/27/2018 in follow-up and was doing well from a CV standpoint.  I last personally saw [name] 07/02/19 in follow up. At that time, it was noted that she had bilateral ankle swelling however she had not taken her PRN Lasix out of fear of kidney dysfunction. She was told that it would be safe to take 2 days of PRN Lasix and to call if swelling did not improve. Unfortunately her creatinine went from 1.52 to 2.01.    Pt's daughter called the office with concern of her LE edema on 08/12/19. She reports that the patients PCP had been managing her swelling and had ordered home health however family was concerned that patient's heart function was worsening, feeling that she may need an echocardiogram.   Pt reports that she  She ambulates very little through the day. Does not have the ability to walk very far without difficulty due to balance issues and COPD. Daughter states HHN is calling PCP to request a couple of doses of lasix to help relieve pressure in legs but patient has CKD so she cannot take long-term diuretics. Family would like patient to be seen by Dr. [name] soon. I advised that he will be out of the office this week and that I will forward request to him for  follow-up when he returns. I advised patient can be seen by another provider in the office and daughter states they will wait for Dr. [name]. I advised her to look at the loungedoctor leg elevation device online as it might be helpful for them to order. I advised her to call back sooner with questions or concerns and she thanked me for the call.   CHECK BMET!  1.  CAD with prior PCI/BMS to RCA: -Denies anginal symptoms -Continue secondary prevention with statin, BB -No ASA in the setting of Eliquis   2.  Chronic diastolic CHF: -Has mild 2-8+ BLE >> recommend 2 days of as needed Lasix.  Patient to call if no resolution. -BMET  3.  CKD stage IV: -Last creatinine, 1.7>>down from 2.16 in 03/19/2019  4.  Paroxysmal atrial fibrillation on chronic anticoagulation: -Maintaining NSR on amiodarone therapy>> previous documentation noted 200 mg p.o. twice daily however patient was taking 200 mg daily at last OV -Anticoagulated with Eliquis without acute signs or symptoms of bleeding in stool or urine -Continue low dose Eliquis  -Last TSH, 4.040, WNL 08/28/2018>> repeat today -CXR 10/17/2017>> repeat  -Receives regular eye exams   5. Hx of tachybrady syndrome: -s/p PPM placement ' -Last download 05/10/2018      Past Medical History:  Diagnosis Date  . Abnormal mammogram, unspecified 08/23/2010   Followup imaging reassuring.   . Acute appendicitis with rupture   . Acute on chronic diastolic  heart failure ([place]) 10/16/2017  . ADENOMATOUS COLONIC POLYP 03/01/2003   Qualifier: Diagnosis of  By: McDiarmid MD, Sherren Mocha Multiple benign polyps of cecum, ascending, transverse and sigmoid colon by 8/09 colonoscopy by Dr Cristina Gong  Colonoscopy by Dr Cristina Gong for iron-deficiency anemia on 04/27/2010 showed three sessile polyps that were in ascending (3 mm x 9 mm), transverse (4 mm), and cecum (3 mm).  All three were tubular adenomas that were negative for high grade dysplasia or malignancy on pathology. Dr Cristina Gong  called the polpys benign and not requiring follow-up in view of the patients age.    . Adrenal adenoma    Incidentaloma  . AF (paroxysmal atrial fibrillation) ([place]) 11/11/2010   Hospitalization (9/8-9/10, Dr Daneen Schick, III, Cardiology) for Paroxysmal Atrial Fibrillation with RVR and anginal pain secondary to demand/supply mismatch in setting of RVR with known circumflex artery branch disease.    Marland Kitchen AKI (acute kidney injury) ([place])   . ANXIETY 04/27/2006   Qualifier: Diagnosis of  By: McDiarmid MD, Sherren Mocha    . Benign essential tremor 02/04/2016  . Candidiasis of the esophagus 11/26/2007  . CAP (community acquired pneumonia) 02/25/2015  . Cataract 2013   Bilateral   . Cellulitis of leg, right 10/01/2012  . Cellulitis of right lower extremity   . Chest pain with moderate risk of acute coronary syndrome 05/21/2015  . Chronic kidney disease (CKD), stage III (moderate)   . Concussion with loss of consciousness   . COPD 04/27/2006   Qualifier: Diagnosis of  By: McDiarmid MD, Sherren Mocha    . COPD exacerbation ([place])   . COPD, severe   . Decreased functional mobility and endurance 03/17/2015  . Dementia [facility]), possible Alzhemier's type 10/26/2012   Montreal Cognitive Assessment (25 out of 30, points off in visuospatial and and short-term memory).  Independent in iADLs and ADLs.    . Diastolic heart failure ([place]) 07/30/2015  . DISC WITH RADICULOPATHY 04/27/2006   Qualifier: Diagnosis of  By: McDiarmid MD, Sherren Mocha    . Dyspnea   . EDEMA-LEGS,DUE TO VENOUS OBSTRUCT. 04/27/2006   Qualifier: Diagnosis of  By: McDiarmid MD, Sherren Mocha    . Fall 10/27/2016  . Fracture of 5th metatarsal, Right, avulsion fx of tip styloid 11/09/2017  . GERD (gastroesophageal reflux disease) 11/08/2015  . 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 Normal uric acid serum level at time of attack.    . Hand trauma, right, initial encounter 03/20/2018  . HERNIA, HIATAL,  NONCONGENITAL 04/27/2006   Qualifier: Diagnosis of  By: McDiarmid MD, Sherren Mocha    . High risk medications (not anticoagulants) long-term use 03/05/2012  . History of Hemorrhoids 04/27/2006   Qualifier: Diagnosis of  By: McDiarmid MD, Sherren Mocha    . History of iron deficiency 01/16/2015  . History of Iron deficiency anemia due to chronic GI blood loss 08/05/2010   Dr Cristina Gong (GI) has evaluated with EGD, colonoscopy, and video capsular endoscopy in 2011 & 2012.  All have been unrevealing as to an origin of IDA.  OV with Dr Cristina Gong (10/28/10) assessment of blood in stool per hemoccult and GER. Hbg 12.1 g/dL, MCV 91.8, Ferritin 30 ng/mL. Patient taking on ferrous sulfate tab daily.   EGD on 03/06/12 by Dr Cristina Gong for IDA non-obstructing Schatzki's ring at [place]  . Hx of colonoscopy with polypectomy 04/27/2010   Dr Cristina Gong found three  tubular adenomas each less than 10 mm size  . Hypertension   .  Hypotension 07/09/2015  . Impaired mobility and ADLs 09/01/2017  . Iron deficiency anemia 08/05/2010   Dr Cristina Gong (GI) has evaluated with EGD, colonoscopy, and video capsular endoscopy in 2011 & 2012.  All have been unrevealing as to an origin of IDA.  OV with Dr Cristina Gong (10/28/10) assessment of blood in stool per hemoccult and GER. Hbg 12.1 g/dL, MCV 91.8, Ferritin 30 ng/mL. Patient taking on ferrous sulfate tab daily.   EGD on 03/06/12 by Dr Cristina Gong for IDA non-obstructing Schatzki's ring at Gastroesophageal junction, otherwise normal esophagus and stomach.     . Laceration of arm, unspecified laterality, initial encounter 11/03/2017  . Left leg cellulitis   . Left Metatarsal fracture, 5th, avulsion styloid 12/29/2017  . Leg cramps 05/29/2012  . Low back pain   . Lumbar herniated disc    History of HNP L4/5 in 2003  . Macular degeneration, bilateral 10/04/2010   Right eye is wet MD, the other is dry macular degeneration (ARMD). Pt undergoing some form of vascular endothelial growth factor inhibition intraocular therapy.    . Mild  cognitive impairment 10/26/2012   (10/25/12) Failed MiniCog screen  . MUSCLE CRAMPS 03/11/2010   Qualifier: Diagnosis of  By: McDiarmid MD, Sherren Mocha    . Muscle spasm of back 09/24/2013  . Myocardial infarct, old   . Nocturia 10/28/2016  . Numbness and tingling in hands 07/21/2011  . Pacemaker    MDT  . Paroxysmal atrial fibrillation (HCC)   . Persistent atrial fibrillation ([place]) 05/31/2014  . PREDIABETES 09/11/2007   Qualifier: Diagnosis of  By: McDiarmid MD, Sherren Mocha    . Retinal hemorrhage of left eye 06/2010  . RHINITIS, ALLERGIC 04/27/2006   Qualifier: Diagnosis of  By: McDiarmid MD, Sherren Mocha    . SCHATZKI'S RING, HX OF 11/26/2007   Qualifier: Diagnosis of  By: McDiarmid MD, Sherren Mocha  An EGD was performed by Dr Cristina Gong on 04/27/2010 for iron deficiency anemia. There was a a transient hiatal hernia with Schatzki's ring. Stomach and duodenum were normal. EGD on 03/06/12 by Dr Cristina Gong for IDA non-obstructing Schatzki's ring at Gastroesophageal junction, otherwise normal esophagus and stomach.    . Seborrheic keratosis, right anterior thigh 12/12/2013  . Shoulder pain, left 01/09/2014  . SICK SINUS SYNDROME 04/27/2006   S/P dual chamber pacemaker placement by Dr Osie Cheeks ([facility]) with pacemaker lead extraction & reinsertion - 07/25/2003  Hospitalization (9/8-9/10, Dr Daneen Schick, III, Cardiology) for Paroxysmal Atrial Fibrillation with RVR and anginal pain secondary to demand/supply mismatch in setting of RVR with known circumflex artery branch disease.    . Sick sinus syndrome with tachycardia ([place])    MDT  . Soft tissue injury of foot 05/03/2011  . Solar lentigo 06/15/2012  . Solitary kidney, acquired 05/17/2010   Surgical removal for transitional cell cancer by Tresa Endo, MD ([place]). Surveillance cystoscopy by Dr Alinda Money [facility]) on 10/19/12 without evidence of cystoscopic recurrence. Recommend RTC one year for cystoscopy.   Marland Kitchen Spinal stenosis, lumbar   . Transitional cell carcinoma of ureter, history    . Traumatic ecchymosis of left foot 12/07/2017  . Trigger point with neck pain 10/26/2018  . Urge incontinence 12/13/2011   Diagnosed in 10/2011 by Dr Bjorn Loser (Urology)   . Venous stasis ulcer of ankle, left ([place]) 10/24/2014  . VENTRICULAR HYPERTROPHY, LEFT 08/28/2008   Qualifier: Diagnosis of  By: McDiarmid MD, Sherren Mocha    . VITAMIN B12 DEFICIENCY 10/07/2009   Qualifier: Diagnosis of  By: McDiarmid MD, Sherren Mocha  Dx based on  a post-TKR anemia work-up Low normal serum B12 with high Methylmalonic acid and homocysteine level  Vit B12 serum level (10/28/10) > 1500 pg/mL   . Vitamin D deficiency 11/02/2010   Serum vitamin D 25(OH) = 10.9 ng/mL (30 -100) on 10/28/10 c/w Vitamin D deficiency.     . Wound of left foot 12/29/2017    Past Surgical History:  Procedure Laterality Date  . BREAST SURGERY     breast reduction  . CARDIOVERSION N/A 10/20/2017   Procedure: CARDIOVERSION;  Surgeon: Josue Hector, MD;  Location: [facility] ENDOSCOPY;  Service: Cardiovascular;  Laterality: N/A;  . CHOLECYSTECTOMY    . CORONARY ANGIOPLASTY WITH STENT PLACEMENT  2010   BMS RCA, OM2 occluded  . CYSTOSCOPY  09/30/2015   No cystoscopic evidence of uroepithelial neoplasm.  Follow up surveillance cystoscopy in one year  . CYSTOSCOPY  10/13/2017   Dr Raynelle Bring [facility])  . DUPUYTREN CONTRACTURE RELEASE Right 05/22/2014   Procedure: DUPUYTREN RELEASE AND REPAIR AS NECESSARY RIGHT RING FINGER AND MIDDLE FINGER;  Surgeon: Roseanne Kaufman, MD;  Location: [facility];  Service: Orthopedics;  Laterality: Right;  . ESOPHAGOGASTRODUODENOSCOPY  04/27/2010   Dr Cristina Gong - found transient H/H & Schatzki's ring  . ESOPHAGOGASTRODUODENOSCOPY ENDOSCOPY  03/06/2012   Dr Cristina Gong - found non-obstuctive Schatzki's ring at [employer] o/w normal EGD.   Marland Kitchen EYE SURGERY     bilateral cataracts  . KNEE ARTHROSCOPY W/ SYNOVECTOMY  11/2009, left knee   Dr Wynelle Link  . NEPHRECTOMY  For transition cell cancer    Dr Tresa Endo, surgeon  . PACEMAKER  GENERATOR CHANGE N/A 11/12/2012   Procedure: PACEMAKER GENERATOR CHANGE;  Surgeon: Evans Lance, MD; [employer] [facility] dual-chamber pacemaker serial number [id number]; Laterality: Right  . PACEMAKER INSERTION  2005   Dr Doreatha Lew  . PACEMAKER LEAD REMOVAL  2005   Removal and reinsertion of atrial and ventricular leads due to migration  . REPLACEMENT TOTAL KNEE  2009, Right knee   Dr Wynelle Link  . REPLACEMENT TOTAL KNEE  05/2009, Left knee   Dr Wynelle Link  . TONSILLECTOMY    . TRIGGER FINGER RELEASE Right 05/22/2014   Procedure: RIGHT HAND A-1 PULLEY RELEASE ;  Surgeon: Roseanne Kaufman, MD;  Location: [facility];  Service: Orthopedics;  Laterality: Right;     Current Outpatient Medications  Medication Sig Dispense Refill  . albuterol (PROVENTIL) (2.5 MG/3ML) 0.083% nebulizer solution USE 1 VIAL VIA NEBULIZER EVERY 4 HOURS AS NEEDED FOR WHEEZING OR SHORTNESS OF BREATH 360 mL 5  . albuterol (VENTOLIN HFA) 108 (90 Base) MCG/ACT inhaler Inhale 2 puffs into the lungs every 4 (four) hours as needed for wheezing or shortness of breath. 18 g 5  . amiodarone (PACERONE) 200 MG tablet Take 1 tablet (200 mg total) by mouth daily. 90 tablet 3  . atorvastatin (LIPITOR) 20 MG tablet TAKE 1 TABLET BY MOUTH DAILY 90 tablet 3  . ELIQUIS 2.5 MG TABS tablet TAKE 1 TABLET(2.5 MG) BY MOUTH TWICE DAILY 60 tablet 5  . fluticasone (FLONASE) 50 MCG/ACT nasal spray SHAKE LIQUID AND USE 2 SPRAYS IN EACH NOSTRIL DAILY 48 g 3  . Fluticasone-Umeclidin-Vilant (TRELEGY ELLIPTA) 100-62.5-25 MCG/INH AEPB Inhale 1 puff into the lungs daily. 60 each 5  . furosemide (LASIX) 40 MG tablet TAKE 1/2 TABLET(20 MG) BY MOUTH DAILY AS NEEDED FOR FLUID RETENTION OR SWELLING 45 tablet 1  . HYDROcodone-acetaminophen (NORCO/VICODIN) 5-325 MG tablet Take 1 tablet by mouth every 4 (four) hours as needed. 10 tablet 0  .  metoprolol succinate (TOPROL-XL) 25 MG 24 hr tablet TAKE 1 TABLET BY MOUTH DAILY 90 tablet 3  . Misc. Devices (3-IN-1 BEDSIDE TOILET) MISC  1 Device by Does not apply route daily. 1 each 0  . nitroGLYCERIN (NITROSTAT) 0.4 MG SL tablet Place 1 tablet (0.4 mg total) under the tongue every 5 (five) minutes as needed for chest pain (x 3 tabs). 25 tablet PRN  . omeprazole (PRILOSEC) 20 MG capsule TAKE 1 CAPSULE(20 MG) BY MOUTH DAILY 90 capsule 3  . ondansetron (ZOFRAN ODT) 4 MG disintegrating tablet 4mg  ODT q4 hours prn nausea/vomit 10 tablet 0  . predniSONE (DELTASONE) 20 MG tablet Take 40 mg daily x 2 days then 20 mg daily x 2 days 6 tablet 0  . Spacer/Aero-Holding Chambers (AEROCHAMBER MV) inhaler Use as instructed 1 each 0  . traMADol (ULTRAM) 50 MG tablet TAKE 1 TABLET BY MOUTH EVERY 6 HOURS AS NEEDED FOR PAIN 30 tablet 2   No current facility-administered medications for this visit.    Allergies:   Baclofen, Codeine phosphate, Hydrochlorothiazide, Montelukast sodium, and Norco [hydrocodone-acetaminophen]    Social History:  The patient  reports that she quit smoking about 10 years ago. Her smoking use included cigarettes. She smoked 1.00 pack per day. She has never used smokeless tobacco. She reports current alcohol use of about 13.0 standard drinks of alcohol per week. She reports that she does not use drugs.   Family History:  The patient's ***family history includes Dementia in her mother; Heart attack in her father; Heart disease in her father.    ROS:  Please see the history of present illness.   Otherwise, review of systems are positive for {NONE DEFAULTED:18576::"none"}.   All other systems are reviewed and negative.    PHYSICAL EXAM: VS:  There were no vitals taken for this visit. , BMI There is no height or weight on file to calculate BMI. GEN: Well nourished, well developed, in no acute distress HEENT: normal Neck: no JVD, carotid bruits, or masses Cardiac: ***RRR; no murmurs, rubs, or gallops,no edema  Respiratory:  clear to auscultation bilaterally, normal work of breathing GI: soft, nontender, nondistended, +  BS MS: no deformity or atrophy Skin: warm and dry, no rash Neuro:  Strength and sensation are intact Psych: euthymic mood, full affect   EKG:  EKG {ACTION; IS/IS INO:67672094} ordered today. The ekg ordered today demonstrates ***   Recent Labs: 08/28/2018: BNP 497.3 05/16/2019: ALT 35 07/02/2019: TSH 3.110 07/19/2019: Hemoglobin 10.1; Platelets 233 07/26/2019: BUN 48; Creatinine, Ser 2.01; Potassium 4.9; Sodium 141    Lipid Panel    Component Value Date/Time   CHOL 130 08/11/2016 1101   TRIG 106 08/11/2016 1101   HDL 37 (L) 08/11/2016 1101   CHOLHDL 3.5 08/11/2016 1101   CHOLHDL 3.4 07/30/2015 1231   VLDL 29 07/30/2015 1231   LDLCALC 72 08/11/2016 1101   LDLDIRECT 59 11/29/2018 1310   LDLDIRECT 74 05/08/2014 1110      Wt Readings from Last 3 Encounters:  08/16/19 175 lb 3.2 oz (79.5 kg)  07/26/19 168 lb (76.2 kg)  07/19/19 170 lb (77.1 kg)      Other studies Reviewed: Additional studies/ records that were reviewed today include: ***. Review of the above records demonstrates: ***  Echo 10/19/2017:  - Left ventricle: The cavity size was normal. Wall thickness was  normal. Systolic function was normal. The estimated ejection  fraction was in the range of 50% to 55%. Wall motion was normal;  there  were no regional wall motion abnormalities. The study is  not technically sufficient to allow evaluation of LV diastolic  function.  - Mitral valve: Mildly thickened leaflets . There was mild to  moderate regurgitation.  - Left atrium: Severely dilated.  - Right ventricle: Poorly visualized. Pacer wire or catheter noted  in right ventricle.  - Right atrium: The atrium was mildly dilated. Pacer wire or  catheter noted in right atrium.  - Tricuspid valve: There was mild regurgitation.  - Pulmonary arteries: PA peak pressure: 18 mm Hg (S).  - Inferior vena cava: The vessel was normal in size. The  respirophasic diameter changes were in the normal range (>=  50%),  consistent with normal central venous pressure.  ASSESSMENT AND PLAN:  1.  ***   Current medicines are reviewed at length with the patient today.  The patient {ACTIONS; HAS/DOES NOT HAVE:19233} concerns regarding medicines.  The following changes have been made:  {PLAN; NO CHANGE:13088:s}  Labs/ tests ordered today include: *** No orders of the defined types were placed in this encounter.    Disposition:   FU with *** in {gen number 4-40:347425} {Days to years:10300}  Signed, Kathyrn Drown, NP  08/20/2019 11:51 AM    [facility] [address], [place], [place]  [address] Phone: [phone]; Fax: [phone]

## 2019-08-20 NOTE — Telephone Encounter (Signed)
Called daughter and left message for her to contact the office to get pt scheduled with Kathyrn Drown, NP on Monday, 6/28.

## 2019-08-20 NOTE — Telephone Encounter (Signed)
Spoke with Dr. [name] and he said ok to order echo.  Spoke with daughter and she was agreeable to plan.  Order placed and pt scheduled for echo on 6/24.

## 2019-08-21 DIAGNOSIS — W1849XD Other slipping, tripping and stumbling without falling, subsequent encounter: Secondary | ICD-10-CM | POA: Diagnosis not present

## 2019-08-21 DIAGNOSIS — I2510 Atherosclerotic heart disease of native coronary artery without angina pectoris: Secondary | ICD-10-CM | POA: Diagnosis not present

## 2019-08-21 DIAGNOSIS — S81802D Unspecified open wound, left lower leg, subsequent encounter: Secondary | ICD-10-CM | POA: Diagnosis not present

## 2019-08-21 DIAGNOSIS — L97819 Non-pressure chronic ulcer of other part of right lower leg with unspecified severity: Secondary | ICD-10-CM | POA: Diagnosis not present

## 2019-08-21 DIAGNOSIS — I872 Venous insufficiency (chronic) (peripheral): Secondary | ICD-10-CM | POA: Diagnosis not present

## 2019-08-21 DIAGNOSIS — I5032 Chronic diastolic (congestive) heart failure: Secondary | ICD-10-CM | POA: Diagnosis not present

## 2019-08-21 NOTE — Progress Notes (Signed)
[name] ([id number]) Visit Report for 08/19/2019 Abuse/Suicide Risk Screen Details Patient Name: Date of Service: [name], A NN M. 08/19/2019 1:15 PM Medical Record Number: [id number] Patient Account Number: [id number] Date of Birth/Sex: Treating RN: October 23, 1923 (86 y.o. Female) Carlene Coria Primary Care Dae Highley: MCDIA RMID, TO DD Other Clinician: Referring Kedrick Mcnamee: Treating Makani Seckman/Extender: Linton Ham MCDIA RMID, TO DD Weeks in Treatment: 0 Abuse/Suicide Risk Screen Items Answer ABUSE RISK SCREEN: Has anyone close to you tried to hurt or harm you recentlyo No Do you feel uncomfortable with anyone in your familyo No Has anyone forced you do things that you didnt want to doo No Electronic Signature(s) Signed: 08/21/2019 4:51:47 PM By: Carlene Coria RN Entered By: Carlene Coria on 08/19/2019 14:14:23 -------------------------------------------------------------------------------- Activities of Daily Living Details Patient Name: Date of Service: [name], A NN M. 08/19/2019 1:15 PM Medical Record Number: [id number] Patient Account Number: [id number] Date of Birth/Sex: Treating RN: 03-20-1923 (86 y.o. Female) Carlene Coria Primary Care Faren Florence: MCDIA RMID, TO DD Other Clinician: Referring Kourtland Coopman: Treating Birttany Dechellis/Extender: Linton Ham MCDIA RMID, TO DD Weeks in Treatment: 0 Activities of Daily Living Items Answer Activities of Daily Living (Please select one for each item) Drive Automobile Completely Able T Medications ake Completely Able Use T elephone Completely Able Care for Appearance Completely Able Use T oilet Completely Able Bath / Shower Completely Able Dress Self Completely Able Feed Self Completely Able Walk Completely Able Get In / Out Bed Completely Able Housework Completely Able Prepare Meals Completely Able Handle Money Completely Able Shop for Self Completely Able Electronic Signature(s) Signed: 08/21/2019 4:51:47 PM By: Carlene Coria RN Entered  By: Carlene Coria on 08/19/2019 14:15:05 -------------------------------------------------------------------------------- Education Screening Details Patient Name: Date of Service: [name], A NN M. 08/19/2019 1:15 PM Medical Record Number: [id number] Patient Account Number: [id number] Date of Birth/Sex: Treating RN: 1923/08/29 (86 y.o. Female) Carlene Coria Primary Care Quinley Nesler: MCDIA RMID, TO DD Other Clinician: Referring Damesha Lawler: Treating Savon Cobbs/Extender: Linton Ham MCDIA RMID, TO DD Weeks in Treatment: 0 Primary Learner Assessed: Patient Learning Preferences/Education Level/Primary Language Learning Preference: Explanation Highest Education Level: College or Above Preferred Language: English Cognitive Barrier Language Barrier: No Translator Needed: No Memory Deficit: No Emotional Barrier: No Cultural/Religious Beliefs Affecting Medical Care: No Physical Barrier Impaired Vision: No Impaired Hearing: No Decreased Hand dexterity: No Knowledge/Comprehension Knowledge Level: Medium Comprehension Level: High Ability to understand written instructions: High Ability to understand verbal instructions: High Motivation Anxiety Level: Calm Cooperation: Cooperative Education Importance: Acknowledges Need Interest in Health Problems: Asks Questions Perception: Coherent Willingness to Engage in Self-Management High Activities: Readiness to Engage in Self-Management High Activities: Electronic Signature(s) Signed: 08/21/2019 4:51:47 PM By: Carlene Coria RN Entered By: Carlene Coria on 08/19/2019 14:16:03 -------------------------------------------------------------------------------- Fall Risk Assessment Details Patient Name: Date of Service: [name], A NN M. 08/19/2019 1:15 PM Medical Record Number: [id number] Patient Account Number: [id number] Date of Birth/Sex: Treating RN: September 19, 1923 (86 y.o. Female) Epps, Morey Hummingbird Primary Care Jaleah Lefevre: MCDIA RMID, TO DD Other  Clinician: Referring Jacere Pangborn: Treating Kylo Gavin/Extender: Linton Ham MCDIA RMID, TO DD Weeks in Treatment: 0 Fall Risk Assessment Items Have you had 2 or more falls in the last 12 monthso 0 No Have you had any fall that resulted in injury in the last 12 monthso 0 No FALLS RISK SCREEN History of falling - immediate or within 3 months 0 No Secondary diagnosis (Do you have 2 or more medical diagnoseso) 0 No Ambulatory aid None/bed rest/wheelchair/nurse 0 No Crutches/cane/walker 0 No Furniture  0 No Intravenous therapy Access/Saline/Heparin Lock 0 No Gait/Transferring Normal/ bed rest/ wheelchair 0 No Weak (short steps with or without shuffle, stooped but able to lift head while walking, may seek 0 No support from furniture) Impaired (short steps with shuffle, may have difficulty arising from chair, head down, impaired 0 No balance) Mental Status Oriented to own ability 0 No Electronic Signature(s) Signed: 08/21/2019 4:51:47 PM By: Carlene Coria RN Entered By: Carlene Coria on 08/19/2019 14:16:43 -------------------------------------------------------------------------------- Foot Assessment Details Patient Name: Date of Service: [name], A NN M. 08/19/2019 1:15 PM Medical Record Number: [id number] Patient Account Number: [id number] Date of Birth/Sex: Treating RN: [birth year] (86 y.o. Female) Carlene Coria Primary Care Roney Youtz: MCDIA RMID, TO DD Other Clinician: Referring Decklyn Hyder: Treating Lamond Glantz/Extender: Linton Ham MCDIA RMID, TO DD Weeks in Treatment: 0 Foot Assessment Items Site Locations + = Sensation present, - = Sensation absent, C = Callus, U = Ulcer R = Redness, W = Warmth, M = Maceration, PU = Pre-ulcerative lesion F = Fissure, S = Swelling, D = Dryness Assessment Right: Left: Other Deformity: No No Prior Foot Ulcer: No No Prior Amputation: No No Charcot Joint: No No Ambulatory Status: Ambulatory Without Help Gait: Steady Electronic  Signature(s) Signed: 08/21/2019 4:51:47 PM By: Carlene Coria RN Entered By: Carlene Coria on 08/19/2019 14:37:09 -------------------------------------------------------------------------------- Nutrition Risk Screening Details Patient Name: Date of Service: [name], A NN M. 08/19/2019 1:15 PM Medical Record Number: [id number] Patient Account Number: [id number] Date of Birth/Sex: Treating RN: 1923/12/27 (86 y.o. Female) Epps, [place] Primary Care Alandra Sando: MCDIA RMID, TO DD Other Clinician: Referring Chauncey Sciulli: Treating Mahir Prabhakar/Extender: Linton Ham MCDIA RMID, TO DD Weeks in Treatment: 0 Height (in): 65 Weight (lbs): 173 Body Mass Index (BMI): 28.8 Nutrition Risk Screening Items Score Screening NUTRITION RISK SCREEN: I have an illness or condition that made me change the kind and/or amount of food I eat 0 No I eat fewer than two meals per day 0 No I eat few fruits and vegetables, or milk products 0 No I have three or more drinks of beer, liquor or wine almost every day 0 No I have tooth or mouth problems that make it hard for me to eat 0 No I don't always have enough money to buy the food I need 0 No I eat alone most of the time 0 No I take three or more different prescribed or over-the-counter drugs a day 1 Yes Without wanting to, I have lost or gained 10 pounds in the last six months 0 No I am not always physically able to shop, cook and/or feed myself 0 No Nutrition Protocols Good Risk Protocol 0 No interventions needed Moderate Risk Protocol High Risk Proctocol Risk Level: Good Risk Score: 1 Electronic Signature(s) Signed: 08/21/2019 4:51:47 PM By: Carlene Coria RN Entered By: Carlene Coria on 08/19/2019 14:17:26

## 2019-08-21 NOTE — Progress Notes (Signed)
DENNEN, Franshesca Jerilynn Stewart ([id number]) Visit Report for 08/19/2019 Chief Complaint Document Details Patient Name: Date of Service: [name], A NN M. 08/19/2019 1:15 PM Medical Record Number: [id number] Patient Account Number: [id number] Date of Birth/Sex: Treating RN: 1923-04-23 (86 y.o. Female) Levan Hurst Primary Care Provider: MCDIA RMID, TO DD Other Clinician: Referring Provider: Treating Provider/Extender: Linton Ham MCDIA RMID, TO DD Weeks in Treatment: 0 Information Obtained from: Patient Chief Complaint 08/19/2019; patient is here for review of wounds on her bilateral lower legs Electronic Signature(s) Signed: 08/19/2019 6:03:41 PM By: Linton Ham MD Entered By: Linton Ham on 08/19/2019 15:31:10 -------------------------------------------------------------------------------- HPI Details Patient Name: Date of Service: [name], A NN M. 08/19/2019 1:15 PM Medical Record Number: [id number] Patient Account Number: [id number] Date of Birth/Sex: Treating RN: March 04, 1923 (86 y.o. Female) Levan Hurst Primary Care Provider: MCDIA RMID, TO DD Other Clinician: Referring Provider: Treating Provider/Extender: Linton Ham MCDIA RMID, TO DD Weeks in Treatment: 0 History of Present Illness HPI Description: ADMISSION 08/19/2019 This is a 86 year old woman who is here for bilateral lower leg wounds. Looking through her history she saw vascular surgery at [facility] in August 2020 diagnosed with lymphedema. She is here with 2 wounds on each of her lower legs. She does have a history of using compression stockings nevertheless these wounds opened up about 2 months ago without obvious cause. She saw her primary physician on 07/26/2019 noticed the wounds. Wondered whether she might be in heart failure and tried to arrange consult with Dr. [name] of cardiology. She had a DVT rule out on 6/10 that was negative. Previously had reflux studies done in 2019. She has 4 wounds 2 on the right which includes  the right anterior lower extremity and the right medial malleolus and 2 on the left which includes the left anterior lower extremity and the most extensive wound is on the left lateral lower extremity. She does not describe claudication pain. Past medical history includes A. fib on Eliquis, congestive heart failure, iron deficiency anemia related to chronic blood loss, chronic kidney disease stage IV, bilateral lower extremity lymphedema, macular degeneration, COPD. She is a minimal ambulator. Last ejection fraction I thought was an EF of 50 to 55% ABI on the right at 0.93 on the left at 0.85 Electronic Signature(s) Signed: 08/19/2019 6:03:41 PM By: Linton Ham MD Entered By: Linton Ham on 08/19/2019 15:33:39 -------------------------------------------------------------------------------- Physical Exam Details Patient Name: Date of Service: [name], A NN M. 08/19/2019 1:15 PM Medical Record Number: [id number] Patient Account Number: [id number] Date of Birth/Sex: Treating RN: May 05, 1923 (86 y.o. Female) Levan Hurst Primary Care Provider: MCDIA RMID, TO DD Other Clinician: Referring Provider: Treating Provider/Extender: Linton Ham MCDIA RMID, TO DD Weeks in Treatment: 0 Constitutional Sitting or standing Blood Pressure is within target range for patient.. Pulse regular and within target range for patient.Marland Kitchen Respirations regular, non-labored and within target range.. Temperature is normal and within the target range for the patient.Marland Kitchen Appears in no distress. Respiratory work of breathing is normal. Bilateral breath sounds are clear and equal in all lobes with no wheezes, rales or rhonchi.. Cardiovascular Irregularly irregular heart sound but no murmurs JVP not elevated.. Popliteal and femoral pulses palpable bilaterally. Needle pulses were easily palpable bilaterally. Nonpitting edema in both lower extremities. Integumentary (Hair, Skin) Changes of chronic venous insufficiency  nonpitting edema in both legs. Psychiatric appears at normal baseline. Notes Wound exam The patient has small wounds on the right anterior lower leg and the right medial malleolus these are superficial  and clean similarly the area on the left anterior also is superficial and clean however the area on the left lateral is more substantial eschar around the wound edges which may need to be removed at some point. Wound beds are dry somewhat desiccated looking. 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 08/19/2019 1:15 PM Medical Record Number: [id number] Patient Account Number: [id number] Date of Birth/Sex: Treating RN: [birth year] (86 y.o. Female) Levan Hurst Primary Care Provider: MCDIA RMID, TO DD Other Clinician: Referring Provider: Treating Provider/Extender: Linton Ham MCDIA RMID, TO DD Weeks in Treatment: 0 Verbal / Phone Orders: No Diagnosis Coding Follow-up Appointments Return Appointment in 2 weeks. Dressing Change Frequency Change dressing three times week. - all wounds (home health can change 2x a week on week that patient comes to wound center) Skin Barriers/Peri-Wound Care Moisturizing lotion - both legs Wound Cleansing May shower with protection. - may use cast protector Primary Wound Dressing Wound #1 Right,Anterior Lower Leg Hydrofera Blue - classic Wound #2 Right,Medial Malleolus Hydrofera Blue - classic Wound #3 Left,Proximal,Anterior Lower Leg Hydrofera Blue - classic Wound #4 Left,Lateral Lower Leg Hydrofera Blue - classic Secondary Dressing Wound #1 Right,Anterior Lower Leg Dry Gauze Wound #2 Right,Medial Malleolus Dry Gauze Wound #3 Left,Proximal,Anterior Lower  Leg Dry Gauze ABD pad Wound #4 Left,Lateral Lower Leg Dry Gauze ABD pad Edema Control 4 layer compression - Bilateral Avoid standing for long periods of time Elevate legs to the level of the heart or above for 30 minutes daily and/or when sitting, a frequency of: - throughout the day Exercise regularly [facility] skilled nursing for wound care. Nanine Means Electronic Signature(s) Signed: 08/19/2019 6:03:41 PM By: Linton Ham MD Signed: 08/20/2019 5:24:50 PM By: Levan Hurst RN, BSN Entered By: Levan Hurst on 08/19/2019 15:28:27 -------------------------------------------------------------------------------- Problem List Details Patient Name: Date of Service: [name], A NN M. 08/19/2019 1:15 PM Medical Record Number: [id number] Patient Account Number: [id number] Date of Birth/Sex: Treating RN: 1923/10/05 (86 y.o. Female) Levan Hurst Primary Care Provider: MCDIA RMID, TO DD Other Clinician: Referring Provider: Treating Provider/Extender: Linton Ham MCDIA RMID, TO DD Weeks in Treatment: 0 Active Problems ICD-10 Encounter Code Description Active Date MDM Diagnosis I89.0 Lymphedema, not elsewhere classified 08/19/2019 No Yes L97.811 Non-pressure chronic ulcer of other part of right lower leg limited to breakdown 08/19/2019 No Yes of skin L97.311 Non-pressure chronic ulcer of right ankle limited to breakdown of skin 08/19/2019 No Yes L97.821 Non-pressure chronic ulcer of other part of left lower leg limited to breakdown 08/19/2019 No Yes of skin Inactive Problems Resolved Problems Electronic Signature(s) Signed: 08/19/2019 6:03:41 PM By: Linton Ham MD Entered By: Linton Ham on 08/19/2019 15:30:25 -------------------------------------------------------------------------------- Progress Note Details Patient Name: Date of Service: [name], A NN M. 08/19/2019 1:15 PM Medical Record Number: [id number] Patient Account Number:  [id number] Date of Birth/Sex: Treating RN: 02-Mar-1923 (86 y.o. Female) Levan Hurst Primary Care Provider: MCDIA RMID, TO DD Other Clinician: Referring Provider: Treating Provider/Extender: Linton Ham MCDIA RMID, TO DD Weeks in Treatment: 0 Subjective Chief Complaint Information obtained from Patient 08/19/2019; patient is here for review of wounds on her bilateral lower legs History of Present Illness (HPI) ADMISSION 08/19/2019 This is a 86 year old woman who is here for bilateral lower leg wounds. Looking through her history she saw vascular  surgery at [facility] in August 2020 diagnosed with lymphedema. She is here with 2 wounds on each of her lower legs. She does have a history of using compression stockings nevertheless these wounds opened up about 2 months ago without obvious cause. She saw her primary physician on 07/26/2019 noticed the wounds. Wondered whether she might be in heart failure and tried to arrange consult with Dr. [name] of cardiology. She had a DVT rule out on 6/10 that was negative. Previously had reflux studies done in 2019. She has 4 wounds 2 on the right which includes the right anterior lower extremity and the right medial malleolus and 2 on the left which includes the left anterior lower extremity and the most extensive wound is on the left lateral lower extremity. She does not describe claudication pain. Past medical history includes A. fib on Eliquis, congestive heart failure, iron deficiency anemia related to chronic blood loss, chronic kidney disease stage IV, bilateral lower extremity lymphedema, macular degeneration, COPD. She is a minimal ambulator. Last ejection fraction I thought was an EF of 50 to 55% ABI on the right at 0.93 on the left at 0.85 Patient History Allergies baclofen, codeine, hydrochlorothiazide, montelukast, hydrocodone Family History Cancer - Father, Diabetes - Father, Hypertension - Child, Lung Disease - Father, No family history of  Heart Disease, Hereditary Spherocytosis, Kidney Disease, Seizures, Stroke, Thyroid Problems, Tuberculosis. Social History Former smoker, Marital Status - Widowed, Alcohol Use - Daily, Drug Use - No History, Caffeine Use - Daily. Medical History Eyes Denies history of Cataracts, Glaucoma, Optic Neuritis Ear/Nose/Mouth/Throat Denies history of Chronic sinus problems/congestion, Middle ear problems Hematologic/Lymphatic Denies history of Anemia, Hemophilia, Human Immunodeficiency Virus, Lymphedema, Sickle Cell Disease Respiratory Patient has history of Chronic Obstructive Pulmonary Disease (COPD) Denies history of Aspiration, Asthma, Pneumothorax, Sleep Apnea, Tuberculosis Cardiovascular Patient has history of Arrhythmia, Congestive Heart Failure, Coronary Artery Disease, Myocardial Infarction Denies history of Angina, Deep Vein Thrombosis, Hypertension, Hypotension, Peripheral Arterial Disease, Peripheral Venous Disease, Phlebitis, Vasculitis Gastrointestinal Denies history of Cirrhosis , Colitis, Crohnoos, Hepatitis A, Hepatitis B, Hepatitis C Endocrine Denies history of Type I Diabetes, Type II Diabetes Genitourinary Denies history of End Stage Renal Disease Immunological Denies history of Lupus Erythematosus, Raynaudoos, Scleroderma Integumentary (Skin) Denies history of History of Burn Musculoskeletal Denies history of Gout, Rheumatoid Arthritis, Osteoarthritis, Osteomyelitis Neurologic Denies history of Dementia, Neuropathy, Quadriplegia, Paraplegia, Seizure Disorder Oncologic Denies history of Received Chemotherapy, Received Radiation Psychiatric Denies history of Anorexia/bulimia, Confinement Anxiety Review of Systems (ROS) Constitutional Symptoms (General Health) Denies complaints or symptoms of Fatigue, Fever, Chills, Marked Weight Change. Eyes Denies complaints or symptoms of Dry Eyes, Vision Changes, Glasses / Contacts. Ear/Nose/Mouth/Throat Denies complaints or  symptoms of Chronic sinus problems or rhinitis. Respiratory Denies complaints or symptoms of Chronic or frequent coughs, Shortness of Breath. Cardiovascular Denies complaints or symptoms of Chest pain. Gastrointestinal Denies complaints or symptoms of Frequent diarrhea, Nausea, Vomiting. Endocrine Denies complaints or symptoms of Heat/cold intolerance. Genitourinary Denies complaints or symptoms of Frequent urination. Integumentary (Skin) Complains or has symptoms of Wounds. Musculoskeletal Denies complaints or symptoms of Muscle Pain, Muscle Weakness. Neurologic Denies complaints or symptoms of Numbness/parasthesias. Psychiatric Denies complaints or symptoms of Claustrophobia, Suicidal. Objective Constitutional Sitting or standing Blood Pressure is within target range for patient.. Pulse regular and within target range for patient.Marland Kitchen Respirations regular, non-labored and within target range.. Temperature is normal and within the target range for the patient.Marland Kitchen Appears in no distress. Vitals Time Taken: 2:07 PM, Height: 65 in, Source: Stated, Weight:  173 lbs, Source: Stated, BMI: 28.8, Temperature: 98 F, Pulse: 88 bpm, Respiratory Rate: 20 breaths/min, Blood Pressure: 129/83 mmHg. Respiratory work of breathing is normal. Bilateral breath sounds are clear and equal in all lobes with no wheezes, rales or rhonchi.. Cardiovascular Irregularly irregular heart sound but no murmurs JVP not elevated.. Popliteal and femoral pulses palpable bilaterally. Needle pulses were easily palpable bilaterally. Nonpitting edema in both lower extremities. Psychiatric appears at normal baseline. General Notes: Wound exam ooThe patient has small wounds on the right anterior lower leg and the right medial malleolus these are superficial and clean similarly the area on the left anterior also is superficial and clean however the area on the left lateral is more substantial eschar around the wound edges  which may need to be removed at some point. Wound beds are dry somewhat desiccated looking. Even with Unna boots her edema control is marginal Integumentary (Hair, Skin) Changes of chronic venous insufficiency nonpitting edema in both legs. Wound #1 status is Open. Original cause of wound was Gradually Appeared. The wound is located on the Right,Anterior Lower Leg. The wound measures 0.7cm length x 0.5cm width x 0.1cm depth; 0.275cm^2 area and 0.027cm^3 volume. There is no tunneling or undermining noted. There is a medium amount of serosanguineous drainage noted. There is large (67-100%) red granulation within the wound bed. There is no necrotic tissue within the wound bed. Wound #2 status is Open. Original cause of wound was Gradually Appeared. The wound is located on the Right,Medial Malleolus. The wound measures 1.3cm length x 1.4cm width x 0.1cm depth; 1.429cm^2 area and 0.143cm^3 volume. There is Fat Layer (Subcutaneous Tissue) Exposed exposed. There is no tunneling or undermining noted. There is a medium amount of serosanguineous drainage noted. There is large (67-100%) red granulation within the wound bed. There is no necrotic tissue within the wound bed. Wound #3 status is Open. Original cause of wound was Gradually Appeared. The wound is located on the Left,Proximal,Anterior Lower Leg. The wound measures 1.4cm length x 1.1cm width x 0.1cm depth; 1.21cm^2 area and 0.121cm^3 volume. There is Fat Layer (Subcutaneous Tissue) Exposed exposed. There is no tunneling or undermining noted. There is a medium amount of serosanguineous drainage noted. There is large (67-100%) red granulation within the wound bed. Wound #4 status is Open. Original cause of wound was Gradually Appeared. The wound is located on the Left,Lateral Lower Leg. The wound measures 7.5cm length x 2.3cm width x 0.1cm depth; 13.548cm^2 area and 1.355cm^3 volume. There is Fat Layer (Subcutaneous Tissue) Exposed exposed. There is  no tunneling or undermining noted. There is a medium amount of serosanguineous drainage noted. There is medium (34-66%) red granulation within the wound bed. There is a medium (34-66%) amount of necrotic tissue within the wound bed including Adherent Slough. Assessment Active Problems ICD-10 Lymphedema, not elsewhere classified Non-pressure chronic ulcer of other part of right lower leg limited to breakdown of skin Non-pressure chronic ulcer of right ankle limited to breakdown of skin Non-pressure chronic ulcer of other part of left lower leg limited to breakdown of skin Procedures Wound #1 Pre-procedure diagnosis of Wound #1 is a Lymphedema located on the Right,Anterior Lower Leg . There was a Four Layer Compression Therapy Procedure by Levan Hurst, RN. Post procedure Diagnosis Wound #1: Same as Pre-Procedure Wound #2 Pre-procedure diagnosis of Wound #2 is a Lymphedema located on the Right,Medial Malleolus . There was a Four Layer Compression Therapy Procedure by Levan Hurst, RN. Post procedure Diagnosis Wound #2: Same as Pre-Procedure  Wound #3 Pre-procedure diagnosis of Wound #3 is a Lymphedema located on the Left,Proximal,Anterior Lower Leg . There was a Four Layer Compression Therapy Procedure by Levan Hurst, RN. Post procedure Diagnosis Wound #3: Same as Pre-Procedure Wound #4 Pre-procedure diagnosis of Wound #4 is a Lymphedema located on the Left,Lateral Lower Leg . There was a Four Layer Compression Therapy Procedure by Levan Hurst, RN. Post procedure Diagnosis Wound #4: Same as Pre-Procedure Plan Follow-up Appointments: Return Appointment in 2 weeks. Dressing Change Frequency: Change dressing three times week. - all wounds (home health can change 2x a week on week that patient comes to wound center) Skin Barriers/Peri-Wound Care: Moisturizing lotion - both legs Wound Cleansing: May shower with protection. - may use cast protector Primary Wound Dressing: Wound  #1 Right,Anterior Lower Leg: Hydrofera Blue - classic Wound #2 Right,Medial Malleolus: Hydrofera Blue - classic Wound #3 Left,Proximal,Anterior Lower Leg: Hydrofera Blue - classic Wound #4 Left,Lateral Lower Leg: Hydrofera Blue - classic Secondary Dressing: Wound #1 Right,Anterior Lower Leg: Dry Gauze Wound #2 Right,Medial Malleolus: Dry Gauze Wound #3 Left,Proximal,Anterior Lower Leg: Dry Gauze ABD pad Wound #4 Left,Lateral Lower Leg: Dry Gauze ABD pad Edema Control: 4 layer compression - Bilateral Avoid standing for long periods of time Elevate legs to the level of the heart or above for 30 minutes daily and/or when sitting, a frequency of: - throughout the day Exercise regularly Home Health: [place] skilled [facility] for wound care. - [place] #1 the wound surfaces are too dry I am going to change to [facility] classic 2. She does not have enough edema control change from Unna boots to 4 layer compression I do not believe she has an arterial issue severe enough to preclude this 3. She sleeps in a recliner but says that her legs stay on the foot rest. She is a minimal ambulator but keeps her legs elevated. 4. She has stockings at home I will have her bring these in when this looks clinic closer to healing. 5. The area on the left lateral leg may require an extensive debridement of circumference eschar. This will be difficult we will look at this carefully in 2 weeks I spent 35 minutes in review of this patient's record face-to-face evaluation and preparation of this record Electronic Signature(s) Signed: 08/19/2019 6:03:41 PM By: Linton Ham MD Entered By: Linton Ham on 08/19/2019 15:37:50 -------------------------------------------------------------------------------- HxROS Details Patient Name: Date of Service: [name], A NN M. 08/19/2019 1:15 PM Medical Record Number: [id number] Patient Account Number: [id number] Date of Birth/Sex: Treating  RN: [birth year] (86 y.o. Female) Carlene Coria Primary Care Provider: MCDIA RMID, TO DD Other Clinician: Referring Provider: Treating Provider/Extender: Linton Ham MCDIA RMID, TO DD Weeks in Treatment: 0 Constitutional Symptoms (General Health) Complaints and Symptoms: Negative for: Fatigue; Fever; Chills; Marked Weight Change Eyes Complaints and Symptoms: Negative for: Dry Eyes; Vision Changes; Glasses / Contacts Medical History: Negative for: Cataracts; Glaucoma; Optic Neuritis Ear/Nose/Mouth/Throat Complaints and Symptoms: Negative for: Chronic sinus problems or rhinitis Medical History: Negative for: Chronic sinus problems/congestion; Middle ear problems Respiratory Complaints and Symptoms: Negative for: Chronic or frequent coughs; Shortness of Breath Medical History: Positive for: Chronic Obstructive Pulmonary Disease (COPD) Negative for: Aspiration; Asthma; Pneumothorax; Sleep Apnea; Tuberculosis Cardiovascular Complaints and Symptoms: Negative for: Chest pain Medical History: Positive for: Arrhythmia; Congestive Heart Failure; Coronary Artery Disease; Myocardial Infarction Negative for: Angina; Deep Vein Thrombosis; Hypertension; Hypotension; Peripheral Arterial Disease; Peripheral Venous Disease; Phlebitis; Vasculitis Gastrointestinal Complaints and Symptoms: Negative for: Frequent diarrhea; Nausea; Vomiting  Medical History: Negative for: Cirrhosis ; Colitis; Crohns; Hepatitis A; Hepatitis B; Hepatitis C Endocrine Complaints and Symptoms: Negative for: Heat/cold intolerance Medical History: Negative for: Type I Diabetes; Type II Diabetes Genitourinary Complaints and Symptoms: Negative for: Frequent urination Medical History: Negative for: End Stage Renal Disease Integumentary (Skin) Complaints and Symptoms: Positive for: Wounds Medical History: Negative for: History of Burn Musculoskeletal Complaints and Symptoms: Negative for: Muscle Pain; Muscle  Weakness Medical History: Negative for: Gout; Rheumatoid Arthritis; Osteoarthritis; Osteomyelitis Neurologic Complaints and Symptoms: Negative for: Numbness/parasthesias Medical History: Negative for: Dementia; Neuropathy; Quadriplegia; Paraplegia; Seizure Disorder Psychiatric Complaints and Symptoms: Negative for: Claustrophobia; Suicidal Medical History: Negative for: Anorexia/bulimia; Confinement Anxiety Hematologic/Lymphatic Medical History: Negative for: Anemia; Hemophilia; Human Immunodeficiency Virus; Lymphedema; Sickle Cell Disease Immunological Medical History: Negative for: Lupus Erythematosus; Raynauds; Scleroderma Oncologic Medical History: Negative for: Received Chemotherapy; Received Radiation Immunizations Pneumococcal Vaccine: Received Pneumococcal Vaccination: Yes Implantable Devices None Family and Social History Cancer: Yes - Father; Diabetes: Yes - Father; Heart Disease: No; Hereditary Spherocytosis: No; Hypertension: Yes - Child; Kidney Disease: No; Lung Disease: Yes - Father; Seizures: No; Stroke: No; Thyroid Problems: No; Tuberculosis: No; Former smoker; Marital Status - Widowed; Alcohol Use: Daily; Drug Use: No History; Caffeine Use: Daily; Financial Concerns: No; Food, Clothing or Shelter Needs: No; Support System Lacking: No; Transportation Concerns: No Electronic Signature(s) Signed: 08/19/2019 6:03:41 PM By: Linton Ham MD Signed: 08/21/2019 4:51:47 PM By: Carlene Coria RN Entered By: Carlene Coria on 08/19/2019 14:14:11 -------------------------------------------------------------------------------- SuperBill Details Patient Name: Date of Service: [name], A NN M. 08/19/2019 Medical Record Number: [id number] Patient Account Number: [id number] Date of Birth/Sex: Treating RN: January 21, 1924 (86 y.o. Female) Levan Hurst Primary Care Provider: MCDIA RMID, TO DD Other Clinician: Referring Provider: Treating Provider/Extender: Linton Ham MCDIA  RMID, TO DD Weeks in Treatment: 0 Diagnosis Coding ICD-10 Codes Code Description I89.0 Lymphedema, not elsewhere classified L97.811 Non-pressure chronic ulcer of other part of right lower leg limited to breakdown of skin L97.311 Non-pressure chronic ulcer of right ankle limited to breakdown of skin L97.821 Non-pressure chronic ulcer of other part of left lower leg limited to breakdown of skin Facility Procedures CPT4: Code [id number] Description: 13 - WOUND CARE VISIT-LEV 3 EST PT Modifier: 25 Quantity: 1 CPT4: [id number] Description: 81 BILATERAL: Application of multi-layer venous compression system; leg (below knee), including ankle and t. Modifier: Quantity: 1 Physician Procedures : CPT4 Code Description Modifier [id number] [employer] PHYS LEVEL 3 NEW PT ICD-10 Diagnosis Description I89.0 Lymphedema, not elsewhere classified L97.811 Non-pressure chronic ulcer of other part of right lower leg limited to breakdown of skin L97.311 Non-pressure  chronic ulcer of right ankle limited to breakdown of skin L97.821 Non-pressure chronic ulcer of other part of left lower leg limited to breakdown of skin Quantity: 1 Electronic Signature(s) Signed: 08/20/2019 5:20:04 PM By: Linton Ham MD Signed: 08/20/2019 5:24:50 PM By: Levan Hurst RN, BSN Previous Signature: 08/19/2019 6:03:41 PM Version By: Linton Ham MD Entered By: Levan Hurst on 08/19/2019 18:56:22

## 2019-08-21 NOTE — Progress Notes (Signed)
[name] ([id number]) Visit Report for 08/19/2019 Allergy List Details Patient Name: Date of Service: [name], Tammy NN M. 08/19/2019 1:15 PM Medical Record Number: [id number] Patient Account Number: [id number] Date of Birth/Sex: Treating RN: April 25, 1923 (86 y.o. Female) [name] Primary Care Aleeah Greeno: MCDIA RMID, TO DD Other Clinician: Referring Aundra Pung: Treating Jahziah Simonin/Extender: Linton Ham MCDIA RMID, TO DD Weeks in Treatment: 0 Allergies Active Allergies baclofen codeine hydrochlorothiazide montelukast hydrocodone Allergy Notes Electronic Signature(s) Signed: 08/21/2019 4:51:47 PM By: Tammy Coria RN Entered By: [name] on 08/19/2019 14:10:06 -------------------------------------------------------------------------------- Arrival Information Details Patient Name: Date of Service: [name], Tammy NN M. 08/19/2019 1:15 PM Medical Record Number: [id number] Patient Account Number: [id number] Date of Birth/Sex: Treating RN: 03-Nov-1923 (86 y.o. Female) [name] Primary Care Hanson Medeiros: MCDIA RMID, TO DD Other Clinician: Referring Rubens Cranston: Treating Erin Uecker/Extender: Linton Ham MCDIA RMID, TO DD Weeks in Treatment: 0 Visit Information Patient Arrived: [name] Time: 14:02 Accompanied By: daughter Transfer Assistance: None Patient Identification Verified: Yes Secondary Verification Process Completed: Yes Patient Requires Transmission-Based Precautions: No Patient Has Alerts: No Electronic Signature(s) Signed: 08/21/2019 4:51:47 PM By: Tammy Coria RN Entered By: [name] on 08/19/2019 14:07:45 -------------------------------------------------------------------------------- Clinic Level of Care Assessment Details Patient Name: Date of Service: [name], Tammy NN M. 08/19/2019 1:15 PM Medical Record Number: [id number] Patient Account Number: [id number] Date of Birth/Sex: Treating RN: 04-24-1923 (86 y.o. Female) [name] Primary Care Ardith Test: MCDIA  RMID, TO DD Other Clinician: Referring Ibtisam Benge: Treating Traci Gafford/Extender: Linton Ham MCDIA RMID, TO DD Weeks in Treatment: 0 Clinic Level of Care Assessment Items TOOL 1 Quantity Score X- 1 0 Use when EandM and Procedure is performed on INITIAL visit ASSESSMENTS - Nursing Assessment / Reassessment X- 1 20 General Physical Exam (combine w/ comprehensive assessment (listed just below) when performed on new pt. evals) X- 1 25 Comprehensive Assessment (HX, ROS, Risk Assessments, Wounds Hx, etc.) ASSESSMENTS - Wound and Skin Assessment / Reassessment []  - 0 Dermatologic / Skin Assessment (not related to wound area) ASSESSMENTS - Ostomy and/or Continence Assessment and Care []  - 0 Incontinence Assessment and Management []  - 0 Ostomy Care Assessment and Management (repouching, etc.) PROCESS - Coordination of Care X - Simple Patient / Family Education for ongoing care 1 15 []  - 0 Complex (extensive) Patient / Family Education for ongoing care X- 1 10 Staff obtains Programmer, systems, Records, T Results / Process Orders est X- 1 10 Staff telephones HHA, Nursing Homes / Clarify orders / etc []  - 0 Routine Transfer to another Facility (non-emergent condition) []  - 0 Routine Hospital Admission (non-emergent condition) X- 1 15 New Admissions / Biomedical engineer / Ordering NPWT Apligraf, etc. , []  - 0 Emergency Hospital Admission (emergent condition) PROCESS - Special Needs []  - 0 Pediatric / Minor Patient Management []  - 0 Isolation Patient Management []  - 0 Hearing / Language / Visual special needs []  - 0 Assessment of Community assistance (transportation, D/C planning, etc.) []  - 0 Additional assistance / Altered mentation []  - 0 Support Surface(s) Assessment (bed, cushion, seat, etc.) INTERVENTIONS - Miscellaneous []  - 0 External ear exam []  - 0 Patient Transfer (multiple staff / Civil Service fast streamer / Similar devices) []  - 0 Simple Staple / Suture removal (25 or less) []   - 0 Complex Staple / Suture removal (26 or more) []  - 0 Hypo/Hyperglycemic Management (do not check if billed separately) X- 1 15 Ankle / Brachial Index (ABI) - do not check if billed separately Has the patient been seen  at the hospital within the last three years: Yes Total Score: 110 Level Of Care: New/Established - Level 3 Electronic Signature(s) Signed: 08/20/2019 5:24:50 PM By: Tammy Hurst RN, BSN Signed: 08/20/2019 5:24:50 PM By: Tammy Hurst RN, BSN Entered By: [name] on 08/19/2019 18:55:58 -------------------------------------------------------------------------------- Compression Therapy Details Patient Name: Date of Service: [name], Tammy NN M. 08/19/2019 1:15 PM Medical Record Number: [id number] Patient Account Number: [id number] Date of Birth/Sex: Treating RN: [birth year] (86 y.o. Female) [name] Primary Care Kendon Sedeno: MCDIA RMID, TO DD Other Clinician: Referring Haila Dena: Treating Aki Burdin/Extender: Linton Ham MCDIA RMID, TO DD Weeks in Treatment: 0 Compression Therapy Performed for Wound Assessment: Wound #1 Right,Anterior Lower Leg Performed By: Clinician Tammy Hurst, RN Compression Type: Four Layer Post Procedure Diagnosis Same as Pre-procedure Electronic Signature(s) Signed: 08/20/2019 5:24:50 PM By: Tammy Hurst RN, BSN Entered By: [name] on 08/19/2019 15:25:23 -------------------------------------------------------------------------------- Compression Therapy Details Patient Name: Date of Service: [name], Tammy NN M. 08/19/2019 1:15 PM Medical Record Number: [id number] Patient Account Number: [id number] Date of Birth/Sex: Treating RN: Aug 10, 1923 (86 y.o. Female) [name] Primary Care Liller Yohn: MCDIA RMID, TO DD Other Clinician: Referring Jaxen Samples: Treating Merit Gadsby/Extender: Linton Ham MCDIA RMID, TO DD Weeks in Treatment: 0 Compression Therapy Performed for Wound Assessment: Wound #2 Right,Medial  Malleolus Performed By: Clinician Tammy Hurst, RN Compression Type: Four Layer Post Procedure Diagnosis Same as Pre-procedure Electronic Signature(s) Signed: 08/20/2019 5:24:50 PM By: Tammy Hurst RN, BSN Entered By: [name] on 08/19/2019 15:25:24 -------------------------------------------------------------------------------- Compression Therapy Details Patient Name: Date of Service: [name], Tammy NN M. 08/19/2019 1:15 PM Medical Record Number: [id number] Patient Account Number: [id number] Date of Birth/Sex: Treating RN: 1923-06-03 (86 y.o. Female) [name] Primary Care Iver Miklas: MCDIA RMID, TO DD Other Clinician: Referring Ambrosia Wisnewski: Treating Naydeline Morace/Extender: Linton Ham MCDIA RMID, TO DD Weeks in Treatment: 0 Compression Therapy Performed for Wound Assessment: Wound #3 Left,Proximal,Anterior Lower Leg Performed By: Clinician Tammy Hurst, RN Compression Type: Four Layer Post Procedure Diagnosis Same as Pre-procedure Electronic Signature(s) Signed: 08/20/2019 5:24:50 PM By: Tammy Hurst RN, BSN Entered By: [name] on 08/19/2019 15:25:24 -------------------------------------------------------------------------------- Compression Therapy Details Patient Name: Date of Service: [name], Tammy NN M. 08/19/2019 1:15 PM Medical Record Number: [id number] Patient Account Number: [id number] Date of Birth/Sex: Treating RN: 1923/09/13 (86 y.o. Female) [name] Primary Care Morrisa Aldaba: MCDIA RMID, TO DD Other Clinician: Referring Fiona Coto: Treating Bunyan Brier/Extender: Linton Ham MCDIA RMID, TO DD Weeks in Treatment: 0 Compression Therapy Performed for Wound Assessment: Wound #4 Left,Lateral Lower Leg Performed By: Clinician Tammy Hurst, RN Compression Type: Four Layer Post Procedure Diagnosis Same as Pre-procedure Electronic Signature(s) Signed: 08/20/2019 5:24:50 PM By: Tammy Hurst RN, BSN Entered By: [name] on 08/19/2019  15:25:24 -------------------------------------------------------------------------------- Encounter Discharge Information Details Patient Name: Date of Service: [name], Tammy NN M. 08/19/2019 1:15 PM Medical Record Number: [id number] Patient Account Number: [id number] Date of Birth/Sex: Treating RN: Dec 26, 1923 (86 y.o. Female) [name] Primary Care Aydn Ferrara: MCDIA RMID, TO DD Other Clinician: Referring Gerrett Loman: Treating Merion Caton/Extender: Linton Ham MCDIA RMID, TO DD Weeks in Treatment: 0 Encounter Discharge Information Items Discharge Condition: Stable Ambulatory Status: Walker Discharge Destination: Home Transportation: Private Auto Accompanied By: daughter Schedule Follow-up Appointment: Yes Clinical Summary of Care: Patient Declined Electronic Signature(s) Signed: 08/19/2019 6:16:49 PM By: [name] Entered By: [name] on 08/19/2019 15:57:15 -------------------------------------------------------------------------------- Lower Extremity Assessment Details Patient Name: Date of Service: [name], Tammy NN M. 08/19/2019 1:15 PM Medical Record Number: [id number] Patient Account Number: [id number]  Date of Birth/Sex: Treating RN: [birth year] (86 y.o. Female) [name] Primary Care Audrea Bolte: MCDIA RMID, TO DD Other Clinician: Referring Aubryana Vittorio: Treating July Linam/Extender: Linton Ham MCDIA RMID, TO DD Weeks in Treatment: 0 Edema Assessment Assessed: [Left: No] [Right: No] E[Left: dema] [Right: :] Calf Left: Right: Point of Measurement: 37 cm From Medial Instep 38 cm 38 cm Ankle Left: Right: Point of Measurement: 10 cm From Medial Instep 24 cm 25 cm Vascular Assessment Blood Pressure: Brachial: [Left:129] [Right:129] Ankle: [Left:Dorsalis Pedis: 110 0.85] [Right:Dorsalis Pedis: 120 0.93] Electronic Signature(s) Signed: 08/21/2019 4:51:47 PM By: Tammy Coria RN Entered By: [name] on 08/19/2019  14:49:34 -------------------------------------------------------------------------------- Multi Wound Chart Details Patient Name: Date of Service: [name], Tammy NN M. 08/19/2019 1:15 PM Medical Record Number: [id number] Patient Account Number: [id number] Date of Birth/Sex: Treating RN: Aug 30, 1923 (86 y.o. Female) [name] Primary Care Nara Paternoster: MCDIA RMID, TO DD Other Clinician: Referring An Schnabel: Treating Senovia Gauer/Extender: Linton Ham MCDIA RMID, TO DD Weeks in Treatment: 0 Vital Signs Height(in): 33 Pulse(bpm): 52 Weight(lbs): 173 Blood Pressure(mmHg): 129/83 Body Mass Index(BMI): 29 Temperature(F): 98 Respiratory Rate(breaths/min): 20 Photos: [1:No Photos Right, Anterior Lower Leg] [2:No Photos Right, Medial Malleolus] [3:No Photos Left, Proximal, Anterior Lower Leg] Wound Location: [1:Gradually Appeared] [2:Gradually Appeared] [3:Gradually Appeared] Wounding Event: [1:Lymphedema] [2:Lymphedema] [3:Lymphedema] Primary Etiology: [1:Chronic Obstructive Pulmonary] [2:Chronic Obstructive Pulmonary] [3:Chronic Obstructive Pulmonary] Comorbid History: [1:Disease (COPD), Arrhythmia, Congestive Heart Failure, Coronary Artery Disease, Myocardial Infarction 06/29/2019] [2:Disease (COPD), Arrhythmia, Congestive Heart Failure, Coronary Artery Disease, Myocardial Infarction 06/29/2019]  [3:Disease (COPD), Arrhythmia, Congestive Heart Failure, Coronary Artery Disease, Myocardial Infarction 05/30/2019] Date Acquired: [1:0] [2:0] [3:0] Weeks of Treatment: [1:Open] [2:Open] [3:Open] Wound Status: [1:0.7x0.5x0.1] [2:1.3x1.4x0.1] [3:1.4x1.1x0.1] Measurements L x W x D (cm) [1:0.275] [2:1.429] [3:1.21] Tammy (cm) : rea [1:0.027] [2:0.143] [3:0.121] Volume (cm) : [1:Full Thickness Without Exposed] [2:Full Thickness Without Exposed] [3:Full Thickness Without Exposed] Classification: [1:Support Structures Medium] [2:Support Structures Medium] [3:Support Structures Medium] Exudate Amount:  [1:Serosanguineous] [2:Serosanguineous] [3:Serosanguineous] Exudate Type: [1:red, brown] [2:red, brown] [3:red, brown] Exudate Color: [1:Large (67-100%)] [2:Large (67-100%)] [3:Large (67-100%)] Granulation Amount: [1:Red] [2:Red] [3:Red] Granulation Quality: [1:None Present (0%)] [2:None Present (0%)] [3:N/Tammy] Necrotic Amount: [1:Fascia: No] [2:Fat Layer (Subcutaneous Tissue)] [3:Fat Layer (Subcutaneous Tissue)] Exposed Structures: [1:Fat Layer (Subcutaneous Tissue) Exposed: No Tendon: No Muscle: No Joint: No Bone: No None] [2:Exposed: Yes Fascia: No Tendon: No Muscle: No Joint: No Bone: No None] [3:Exposed: Yes Fascia: No Tendon: No Muscle: No Joint: No Bone: No None] Epithelialization: [1:Compression Therapy] [2:Compression Therapy] [3:Compression Dellroy Wound Number: 4 N/Tammy N/Tammy Photos: No Photos N/Tammy N/Tammy Left, Lateral Lower Leg N/Tammy N/Tammy Wound Location: Gradually Appeared N/Tammy N/Tammy Wounding Event: Lymphedema N/Tammy N/Tammy Primary Etiology: Chronic Obstructive Pulmonary N/Tammy N/Tammy Comorbid History: Disease (COPD), Arrhythmia, Congestive Heart Failure, Coronary Artery Disease, Myocardial Infarction 05/30/2019 N/Tammy N/Tammy Date Acquired: 0 N/Tammy N/Tammy Weeks of Treatment: Open N/Tammy N/Tammy Wound Status: 7.5x2.3x0.1 N/Tammy N/Tammy Measurements L x W x D (cm) 13.548 N/Tammy N/Tammy Tammy (cm) : rea 1.355 N/Tammy N/Tammy Volume (cm) : Full Thickness Without Exposed N/Tammy N/Tammy Classification: Support Structures Medium N/Tammy N/Tammy Exudate Amount: Serosanguineous N/Tammy N/Tammy Exudate Type: red, brown N/Tammy N/Tammy Exudate Color: Medium (34-66%) N/Tammy N/Tammy Granulation Amount: Red N/Tammy N/Tammy Granulation Quality: Medium (34-66%) N/Tammy N/Tammy Necrotic Amount: Fat Layer (Subcutaneous Tissue) N/Tammy N/Tammy Exposed Structures: Exposed: Yes Fascia: No Tendon: No Muscle: No Joint: No Bone: No None N/Tammy N/Tammy Epithelialization: Compression Therapy N/Tammy N/Tammy Procedures Performed: Treatment Notes Electronic Signature(s) Signed: 08/19/2019 6:03:41 PM By: Linton Ham MD Signed:  08/20/2019 5:24:50 PM By: Tammy Hurst RN, BSN Entered By: Linton Ham on 08/19/2019 15:30:35 -------------------------------------------------------------------------------- Multi-Disciplinary Care Plan Details Patient Name: Date of Service: [name], Tammy NN M. 08/19/2019 1:15 PM Medical Record Number: [id number] Patient Account Number: [id number] Date of Birth/Sex: Treating RN: 1923-11-06 (86 y.o. Female) [name] Primary Care Carroll Ranney: MCDIA RMID, TO DD Other Clinician: Referring Ezmeralda Stefanick: Treating Surena Welge/Extender: Linton Ham MCDIA RMID, TO DD Weeks in Treatment: 0 Active Inactive Abuse / Safety / Falls / Self Care Management Nursing Diagnoses: Potential for falls Potential for injury related to falls Goals: Patient will not experience any injury related to falls Date Initiated: 08/19/2019 Target Resolution Date: 09/20/2019 Goal Status: Active Patient/caregiver will verbalize/demonstrate measures taken to prevent injury and/or falls Date Initiated: 08/19/2019 Target Resolution Date: 09/20/2019 Goal Status: Active Interventions: Assess Activities of Daily Living upon admission and as needed Assess fall risk on admission and as needed Assess: immobility, friction, shearing, incontinence upon admission and as needed Assess impairment of mobility on admission and as needed per policy Assess personal safety and home safety (as indicated) on admission and as needed Assess self care needs on admission and as needed Provide education on fall prevention Provide education on personal and home safety Notes: Venous Leg Ulcer Nursing Diagnoses: Knowledge deficit related to disease process and management Potential for venous Insuffiency (use before diagnosis confirmed) Goals: Patient will maintain optimal edema control Date Initiated: 08/19/2019 Target Resolution Date: 09/20/2019 Goal Status: Active Patient/caregiver will verbalize understanding of  disease process and disease management Date Initiated: 08/19/2019 Target Resolution Date: 09/20/2019 Goal Status: Active Interventions: Assess peripheral edema status every visit. Compression as ordered Provide education on venous insufficiency Notes: Wound/Skin Impairment Nursing Diagnoses: Impaired tissue integrity Knowledge deficit related to ulceration/compromised skin integrity Goals: Patient/caregiver will verbalize understanding of skin care regimen Date Initiated: 08/19/2019 Target Resolution Date: 09/20/2019 Goal Status: Active Interventions: Assess patient/caregiver ability to obtain necessary supplies Assess patient/caregiver ability to perform ulcer/skin care regimen upon admission and as needed Assess ulceration(s) every visit Provide education on ulcer and skin care Notes: Electronic Signature(s) Signed: 08/20/2019 5:24:50 PM By: Tammy Hurst RN, BSN Entered By: [name] on 08/19/2019 18:55:01 -------------------------------------------------------------------------------- Patient/Caregiver Education Details Patient Name: Date of Service: [name], Tammy NN M. 6/21/2021andnbsp1:15 PM Medical Record Number: [id number] Patient Account Number: [id number] Date of Birth/Gender: Treating RN: 06/26/1923 (86 y.o. Female) [name] Primary Care Physician: MCDIA RMID, TO DD Other Clinician: Referring Physician: Treating Physician/Extender: Linton Ham MCDIA RMID, TO DD Weeks in Treatment: 0 Education Assessment Education Provided To: Patient Education Topics Provided Safety: Methods: Explain/Verbal Responses: State content correctly Venous: Methods: Explain/Verbal Responses: State content correctly Wound/Skin Impairment: Methods: Explain/Verbal Responses: State content correctly Electronic Signature(s) Signed: 08/20/2019 5:24:50 PM By: Tammy Hurst RN, BSN Entered By: [name] on 08/19/2019  18:55:15 -------------------------------------------------------------------------------- Wound Assessment Details Patient Name: Date of Service: [name], Tammy NN M. 08/19/2019 1:15 PM Medical Record Number: [id number] Patient Account Number: [id number] Date of Birth/Sex: Treating RN: 06/29/1923 (86 y.o. Female) [name], [name] Primary Care Clovia Reine: MCDIA RMID, TO DD Other Clinician: Referring Serenna Deroy: Treating Jisela Merlino/Extender: Linton Ham MCDIA RMID, TO DD Weeks in Treatment: 0 Wound Status Wound Number: 1 Primary Lymphedema Etiology: Wound Location: Right, Anterior Lower Leg Wound Open Wounding Event: Gradually Appeared Status: Date Acquired: 06/29/2019 Comorbid Chronic Obstructive Pulmonary Disease (COPD), Arrhythmia, Weeks Of Treatment: 0 History: Congestive Heart Failure, Coronary Artery Disease, Myocardial Clustered Wound: No Infarction Photos Wound Measurements Length: (cm) 0.7 Width: (cm) 0.5 Depth: (cm) 0.1  Area: (cm) 0.275 Volume: (cm) 0.027 % Reduction in Area: 0% % Reduction in Volume: 0% Epithelialization: None Tunneling: No Undermining: No Wound Description Classification: Full Thickness Without Exposed Support Struc Exudate Amount: Medium Exudate Type: Serosanguineous Exudate Color: red, brown tures Foul Odor After Cleansing: No Slough/Fibrino No Wound Bed Granulation Amount: Large (67-100%) Exposed Structure Granulation Quality: Red Fascia Exposed: No Necrotic Amount: None Present (0%) Fat Layer (Subcutaneous Tissue) Exposed: No Tendon Exposed: No Muscle Exposed: No Joint Exposed: No Bone Exposed: No Treatment Notes Wound #1 (Right, Anterior Lower Leg) 1. Cleanse With Wound Cleanser 2. Periwound Care Moisturizing lotion 3. Primary Dressing Applied Hydrofera Blue 4. Secondary Dressing ABD Pad Dry Gauze 6. Support Layer Applied 4 layer compression wrap Notes classic hydrofera. netting Electronic Signature(s) Signed: 08/21/2019  4:51:47 PM By: Tammy Coria RN Signed: 08/21/2019 5:20:20 PM By: Stewart Fester Entered By: Stewart Fester on 08/21/2019 09:40:04 -------------------------------------------------------------------------------- Wound Assessment Details Patient Name: Date of Service: [name], Tammy NN M. 08/19/2019 1:15 PM Medical Record Number: [id number] Patient Account Number: [id number] Date of Birth/Sex: Treating RN: [birth year] (86 y.o. Female) [name], [name] Primary Care Kinze Labo: MCDIA RMID, TO DD Other Clinician: Referring Lelend Heinecke: Treating Shareef Eddinger/Extender: Linton Ham MCDIA RMID, TO DD Weeks in Treatment: 0 Wound Status Wound Number: 2 Primary Lymphedema Etiology: Wound Location: Right, Medial Malleolus Wound Open Wounding Event: Gradually Appeared Status: Date Acquired: 06/29/2019 Comorbid Chronic Obstructive Pulmonary Disease (COPD), Arrhythmia, Weeks Of Treatment: 0 History: Congestive Heart Failure, Coronary Artery Disease, Myocardial Clustered Wound: No Infarction Photos Wound Measurements Length: (cm) 1.3 Width: (cm) 1.4 Depth: (cm) 0.1 Area: (cm) 1.429 Volume: (cm) 0.143 % Reduction in Area: 0% % Reduction in Volume: 0% Epithelialization: None Tunneling: No Undermining: No Wound Description Classification: Full Thickness Without Exposed Support Structures Exudate Amount: Medium Exudate Type: Serosanguineous Exudate Color: red, brown Foul Odor After Cleansing: No Slough/Fibrino No Wound Bed Granulation Amount: Large (67-100%) Exposed Structure Granulation Quality: Red Fascia Exposed: No Necrotic Amount: None Present (0%) Fat Layer (Subcutaneous Tissue) Exposed: Yes Tendon Exposed: No Muscle Exposed: No Joint Exposed: No Bone Exposed: No Treatment Notes Wound #2 (Right, Medial Malleolus) 1. Cleanse With Wound Cleanser 2. Periwound Care Moisturizing lotion 3. Primary Dressing Applied Hydrofera Blue 4. Secondary Dressing ABD Pad Dry Gauze 6. Support Layer  Applied 4 layer compression wrap Notes classic hydrofera. netting Electronic Signature(s) Signed: 08/21/2019 4:51:47 PM By: Tammy Coria RN Signed: 08/21/2019 5:20:20 PM By: Stewart Fester Entered By: Stewart Fester on 08/21/2019 09:39:44 -------------------------------------------------------------------------------- Wound Assessment Details Patient Name: Date of Service: [name], Tammy NN M. 08/19/2019 1:15 PM Medical Record Number: [id number] Patient Account Number: [id number] Date of Birth/Sex: Treating RN: 05-Feb-1924 (86 y.o. Female) [name], [name] Primary Care Armelia Penton: MCDIA RMID, TO DD Other Clinician: Referring Walda Hertzog: Treating Taron Conrey/Extender: Linton Ham MCDIA RMID, TO DD Weeks in Treatment: 0 Wound Status Wound Number: 3 Primary Lymphedema Etiology: Wound Location: Left, Proximal, Anterior Lower Leg Wound Open Wounding Event: Gradually Appeared Status: Date Acquired: 05/30/2019 Comorbid Chronic Obstructive Pulmonary Disease (COPD), Arrhythmia, Weeks Of Treatment: 0 History: Congestive Heart Failure, Coronary Artery Disease, Myocardial Clustered Wound: No Infarction Photos Wound Measurements Length: (cm) 1.4 Width: (cm) 1.1 Depth: (cm) 0.1 Area: (cm) 1.21 Volume: (cm) 0.121 % Reduction in Area: 0% % Reduction in Volume: 0% Epithelialization: None Tunneling: No Undermining: No Wound Description Classification: Full Thickness Without Exposed Support Structu Exudate Amount: Medium Exudate Type: Serosanguineous Exudate Color: red, brown res Foul Odor After Cleansing: No Slough/Fibrino Yes Wound Bed Granulation Amount: Large (67-100%) Exposed  Structure Granulation Quality: Red Fascia Exposed: No Fat Layer (Subcutaneous Tissue) Exposed: Yes Tendon Exposed: No Muscle Exposed: No Joint Exposed: No Bone Exposed: No Treatment Notes Wound #3 (Left, Proximal, Anterior Lower Leg) 1. Cleanse With Wound Cleanser 2. Periwound Care Moisturizing lotion 3.  Primary Dressing Applied Hydrofera Blue 4. Secondary Dressing ABD Pad Dry Gauze 6. Support Layer Applied 4 layer compression wrap Notes classic hydrofera. netting Electronic Signature(s) Signed: 08/21/2019 4:51:47 PM By: Tammy Coria RN Signed: 08/21/2019 5:20:20 PM By: Stewart Fester Entered By: Stewart Fester on 08/21/2019 09:39:15 -------------------------------------------------------------------------------- Wound Assessment Details Patient Name: Date of Service: [name], Tammy NN M. 08/19/2019 1:15 PM Medical Record Number: [id number] Patient Account Number: [id number] Date of Birth/Sex: Treating RN: Jul 28, 1923 (86 y.o. Female) [name], [name] Primary Care Myriam Brandhorst: MCDIA RMID, TO DD Other Clinician: Referring Mario Voong: Treating Chrisie Jankovich/Extender: Linton Ham MCDIA RMID, TO DD Weeks in Treatment: 0 Wound Status Wound Number: 4 Primary Lymphedema Etiology: Wound Location: Left, Lateral Lower Leg Wound Open Wounding Event: Gradually Appeared Status: Date Acquired: 05/30/2019 Comorbid Chronic Obstructive Pulmonary Disease (COPD), Arrhythmia, Weeks Of Treatment: 0 History: Congestive Heart Failure, Coronary Artery Disease, Myocardial Clustered Wound: No Infarction Photos Wound Measurements Length: (cm) 7.5 Width: (cm) 2.3 Depth: (cm) 0.1 Area: (cm) 13.548 Volume: (cm) 1.355 % Reduction in Area: 0% % Reduction in Volume: 0% Epithelialization: None Tunneling: No Undermining: No Wound Description Classification: Full Thickness Without Exposed Support Structures Exudate Amount: Medium Exudate Type: Serosanguineous Exudate Color: red, brown Foul Odor After Cleansing: No Slough/Fibrino Yes Wound Bed Granulation Amount: Medium (34-66%) Exposed Structure Granulation Quality: Red Fascia Exposed: No Necrotic Amount: Medium (34-66%) Fat Layer (Subcutaneous Tissue) Exposed: Yes Necrotic Quality: Adherent Slough Tendon Exposed: No Muscle Exposed: No Joint Exposed: No Bone  Exposed: No Treatment Notes Wound #4 (Left, Lateral Lower Leg) 1. Cleanse With Wound Cleanser 2. Periwound Care Moisturizing lotion 3. Primary Dressing Applied Hydrofera Blue 4. Secondary Dressing ABD Pad Dry Gauze 6. Support Layer Applied 4 layer compression wrap Notes classic hydrofera. netting Electronic Signature(s) Signed: 08/21/2019 4:51:47 PM By: Tammy Coria RN Signed: 08/21/2019 5:20:20 PM By: Stewart Fester Entered By: Stewart Fester on 08/21/2019 09:38:40 -------------------------------------------------------------------------------- Vitals Details Patient Name: Date of Service: [name], Tammy NN M. 08/19/2019 1:15 PM Medical Record Number: [id number] Patient Account Number: [id number] Date of Birth/Sex: Treating RN: [birth year] (86 y.o. Female) [name], [name] Primary Care Yalissa Fink: MCDIA RMID, TO DD Other Clinician: Referring Carley Glendenning: Treating English Tomer/Extender: Linton Ham MCDIA RMID, TO DD Weeks in Treatment: 0 Vital Signs Time Taken: 14:07 Temperature (F): 98 Height (in): 65 Pulse (bpm): 88 Source: Stated Respiratory Rate (breaths/min): 20 Weight (lbs): 173 Blood Pressure (mmHg): 129/83 Source: Stated Reference Range: 80 - 120 mg / dl Body Mass Index (BMI): 28.8 Electronic Signature(s) Signed: 08/21/2019 4:51:47 PM By: Tammy Coria RN Entered By: [name] on 08/19/2019 14:08:41

## 2019-08-22 ENCOUNTER — Ambulatory Visit (HOSPITAL_COMMUNITY): Payer: Medicare Other | Attending: Cardiology

## 2019-08-22 ENCOUNTER — Other Ambulatory Visit: Payer: Self-pay

## 2019-08-22 DIAGNOSIS — I5032 Chronic diastolic (congestive) heart failure: Secondary | ICD-10-CM | POA: Diagnosis present

## 2019-08-23 DIAGNOSIS — L97819 Non-pressure chronic ulcer of other part of right lower leg with unspecified severity: Secondary | ICD-10-CM | POA: Diagnosis not present

## 2019-08-23 DIAGNOSIS — I5032 Chronic diastolic (congestive) heart failure: Secondary | ICD-10-CM | POA: Diagnosis not present

## 2019-08-23 DIAGNOSIS — S81802D Unspecified open wound, left lower leg, subsequent encounter: Secondary | ICD-10-CM | POA: Diagnosis not present

## 2019-08-23 DIAGNOSIS — I872 Venous insufficiency (chronic) (peripheral): Secondary | ICD-10-CM | POA: Diagnosis not present

## 2019-08-23 DIAGNOSIS — W1849XD Other slipping, tripping and stumbling without falling, subsequent encounter: Secondary | ICD-10-CM | POA: Diagnosis not present

## 2019-08-23 DIAGNOSIS — I2510 Atherosclerotic heart disease of native coronary artery without angina pectoris: Secondary | ICD-10-CM | POA: Diagnosis not present

## 2019-08-25 NOTE — Progress Notes (Signed)
Cardiology Office Note:    Date:  08/26/2019   ID:  [name], DOB April 09, 1923, MRN [id number]  PCP:  McDiarmid, Blane Ohara, MD  Cardiologist:  Sinclair Grooms, MD   Referring MD: McDiarmid, Blane Ohara, MD   Chief Complaint  Patient presents with  . Congestive Heart Failure    History of Present Illness:    [name] is a 86 y.o. female with a hx of rightcoronary BM stent, persistent atrial fibrillationbut rhythm control onamiodarone therapy, chronic anticoagulation therapy, chronic diastolic heart failure, hypertension, and pacemaker for tachybradycardia syndrome.  Decompensated acute diastolic heart failure August 2019 related to persistent AF.  Being seen for increased swelling and shortness of breath over past few weeks. In review of records, there is confusion concerning the presence or absence of atrial fibrillation and therefore the need for amiodarone. She underwent cardioversion in 2019 and was left on amiiodarone. However, last several EP notes suggest AF is persistent and don't mention amiodarone.  Family began noticing lower extremity swelling.  Skin breaks occurred and now her legs are wrapped.  The skin breaks were not healing and there was weeping bilateral lower extremity edema.  She has chronic shortness of breath but no orthopnea.  She denies syncope.  Lower extremity edema is significantly improved after wrapping.  She is here at the request of the family because they wanted to make sure her "heart was doing okay".  Dr. Milus Height and the family are reluctant to use daily diuretic therapy for fear of dehydration and worsening CKD.  No angina.  No syncope.  No PND.  Past Medical History:  Diagnosis Date  . Abnormal mammogram, unspecified 08/23/2010   Followup imaging reassuring.   . Acute appendicitis with rupture   . Acute on chronic diastolic heart failure ([place]) 10/16/2017  . ADENOMATOUS COLONIC POLYP 03/01/2003   Qualifier: Diagnosis of  By: McDiarmid MD, Sherren Mocha Multiple  benign polyps of cecum, ascending, transverse and sigmoid colon by 8/09 colonoscopy by Dr Cristina Gong  Colonoscopy by Dr Cristina Gong for iron-deficiency anemia on 04/27/2010 showed three sessile polyps that were in ascending (3 mm x 9 mm), transverse (4 mm), and cecum (3 mm).  All three were tubular adenomas that were negative for high grade dysplasia or malignancy on pathology. Dr Cristina Gong called the polpys benign and not requiring follow-up in view of the patients age.    . Adrenal adenoma    Incidentaloma  . AF (paroxysmal atrial fibrillation) ([place]) 11/11/2010   Hospitalization (9/8-9/10, Dr Daneen Schick, III, Cardiology) for Paroxysmal Atrial Fibrillation with RVR and anginal pain secondary to demand/supply mismatch in setting of RVR with known circumflex artery branch disease.    Marland Kitchen AKI (acute kidney injury) ([place])   . ANXIETY 04/27/2006   Qualifier: Diagnosis of  By: McDiarmid MD, Sherren Mocha    . Benign essential tremor 02/04/2016  . Candidiasis of the esophagus 11/26/2007  . CAP (community acquired pneumonia) 02/25/2015  . Cataract 2013   Bilateral   . Cellulitis of leg, right 10/01/2012  . Cellulitis of right lower extremity   . Chest pain with moderate risk of acute coronary syndrome 05/21/2015  . Chronic kidney disease (CKD), stage III (moderate)   . Concussion with loss of consciousness   . COPD 04/27/2006   Qualifier: Diagnosis of  By: McDiarmid MD, Sherren Mocha    . COPD exacerbation ([place])   . COPD, severe   . Decreased functional mobility and endurance 03/17/2015  . Dementia ([place]), possible Alzhemier's type 10/26/2012  Montreal Cognitive Assessment (25 out of 30, points off in visuospatial and and short-term memory).  Independent in iADLs and ADLs.    . Diastolic heart failure ([place]) 07/30/2015  . DISC WITH RADICULOPATHY 04/27/2006   Qualifier: Diagnosis of  By: McDiarmid MD, Sherren Mocha    . Dyspnea   . EDEMA-LEGS,DUE TO VENOUS OBSTRUCT. 04/27/2006   Qualifier: Diagnosis of  By: McDiarmid MD, Sherren Mocha    . Fall 10/27/2016    . Fracture of 5th metatarsal, Right, avulsion fx of tip styloid 11/09/2017  . GERD (gastroesophageal reflux disease) 11/08/2015  . 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 Normal uric acid serum level at time of attack.    . Hand trauma, right, initial encounter 03/20/2018  . HERNIA, HIATAL, NONCONGENITAL 04/27/2006   Qualifier: Diagnosis of  By: McDiarmid MD, Sherren Mocha    . High risk medications (not anticoagulants) long-term use 03/05/2012  . History of Hemorrhoids 04/27/2006   Qualifier: Diagnosis of  By: McDiarmid MD, Sherren Mocha    . History of iron deficiency 01/16/2015  . History of Iron deficiency anemia due to chronic GI blood loss 08/05/2010   Dr Cristina Gong (GI) has evaluated with EGD, colonoscopy, and video capsular endoscopy in 2011 & 2012.  All have been unrevealing as to an origin of IDA.  OV with Dr Cristina Gong (10/28/10) assessment of blood in stool per hemoccult and GER. Hbg 12.1 g/dL, MCV 91.8, Ferritin 30 ng/mL. Patient taking on ferrous sulfate tab daily.   EGD on 03/06/12 by Dr Cristina Gong for IDA non-obstructing Schatzki's ring at [place]  . Hx of colonoscopy with polypectomy 04/27/2010   Dr Cristina Gong found three  tubular adenomas each less than 10 mm size  . Hypertension   . Hypotension 07/09/2015  . Impaired mobility and ADLs 09/01/2017  . Iron deficiency anemia 08/05/2010   Dr Cristina Gong (GI) has evaluated with EGD, colonoscopy, and video capsular endoscopy in 2011 & 2012.  All have been unrevealing as to an origin of IDA.  OV with Dr Cristina Gong (10/28/10) assessment of blood in stool per hemoccult and GER. Hbg 12.1 g/dL, MCV 91.8, Ferritin 30 ng/mL. Patient taking on ferrous sulfate tab daily.   EGD on 03/06/12 by Dr Cristina Gong for IDA non-obstructing Schatzki's ring at Gastroesophageal junction, otherwise normal esophagus and stomach.     . Laceration of arm, unspecified laterality, initial encounter 11/03/2017  . Left leg cellulitis   . Left  Metatarsal fracture, 5th, avulsion styloid 12/29/2017  . Leg cramps 05/29/2012  . Low back pain   . Lumbar herniated disc    History of HNP L4/5 in 2003  . Macular degeneration, bilateral 10/04/2010   Right eye is wet MD, the other is dry macular degeneration (ARMD). Pt undergoing some form of vascular endothelial growth factor inhibition intraocular therapy.    . Mild cognitive impairment 10/26/2012   (10/25/12) Failed MiniCog screen  . MUSCLE CRAMPS 03/11/2010   Qualifier: Diagnosis of  By: McDiarmid MD, Sherren Mocha    . Muscle spasm of back 09/24/2013  . Myocardial infarct, old   . Nocturia 10/28/2016  . Numbness and tingling in hands 07/21/2011  . Pacemaker    MDT  . Paroxysmal atrial fibrillation (HCC)   . Persistent atrial fibrillation ([place]) 05/31/2014  . PREDIABETES 09/11/2007   Qualifier: Diagnosis of  By: McDiarmid MD, Sherren Mocha    . Retinal hemorrhage of left eye 06/2010  . RHINITIS, ALLERGIC 04/27/2006   Qualifier:  Diagnosis of  By: McDiarmid MD, Sherren Mocha    . SCHATZKI'S RING, HX OF 11/26/2007   Qualifier: Diagnosis of  By: McDiarmid MD, Sherren Mocha  An EGD was performed by Dr Cristina Gong on 04/27/2010 for iron deficiency anemia. There was a a transient hiatal hernia with Schatzki's ring. Stomach and duodenum were normal. EGD on 03/06/12 by Dr Cristina Gong for IDA non-obstructing Schatzki's ring at Gastroesophageal junction, otherwise normal esophagus and stomach.    . Seborrheic keratosis, right anterior thigh 12/12/2013  . Shoulder pain, left 01/09/2014  . SICK SINUS SYNDROME 04/27/2006   S/P dual chamber pacemaker placement by Dr Osie Cheeks (EPS-[facility]) with pacemaker lead extraction & reinsertion - 07/25/2003  Hospitalization (9/8-9/10, Dr Daneen Schick, III, Cardiology) for Paroxysmal Atrial Fibrillation with RVR and anginal pain secondary to demand/supply mismatch in setting of RVR with known circumflex artery branch disease.    . Sick sinus syndrome with tachycardia ([place])    MDT  . Soft tissue injury of foot 05/03/2011    . Solar lentigo 06/15/2012  . Solitary kidney, acquired 05/17/2010   Surgical removal for transitional cell cancer by Tresa Endo, MD ([place]). Surveillance cystoscopy by Dr Alinda Money [facility]) on 10/19/12 without evidence of cystoscopic recurrence. Recommend RTC one year for cystoscopy.   Marland Kitchen Spinal stenosis, lumbar   . Transitional cell carcinoma of ureter, history   . Traumatic ecchymosis of left foot 12/07/2017  . Trigger point with neck pain 10/26/2018  . Urge incontinence 12/13/2011   Diagnosed in 10/2011 by Dr Bjorn Loser (Urology)   . Venous stasis ulcer of ankle, left ([place]) 10/24/2014  . VENTRICULAR HYPERTROPHY, LEFT 08/28/2008   Qualifier: Diagnosis of  By: McDiarmid MD, Sherren Mocha    . VITAMIN B12 DEFICIENCY 10/07/2009   Qualifier: Diagnosis of  By: McDiarmid MD, Sherren Mocha  Dx based on a post-TKR anemia work-up Low normal serum B12 with high Methylmalonic acid and homocysteine level  Vit B12 serum level (10/28/10) > 1500 pg/mL   . Vitamin D deficiency 11/02/2010   Serum vitamin D 25(OH) = 10.9 ng/mL (30 -100) on 10/28/10 c/w Vitamin D deficiency.     . Wound of left foot 12/29/2017    Past Surgical History:  Procedure Laterality Date  . BREAST SURGERY     breast reduction  . CARDIOVERSION N/A 10/20/2017   Procedure: CARDIOVERSION;  Surgeon: Josue Hector, MD;  [facility] ENDOSCOPY;  Service: Cardiovascular;  Laterality: N/A;  . CHOLECYSTECTOMY    . CORONARY ANGIOPLASTY WITH STENT PLACEMENT  2010   BMS RCA, OM2 occluded  . CYSTOSCOPY  09/30/2015   No cystoscopic evidence of uroepithelial neoplasm.  Follow up surveillance cystoscopy in one year  . CYSTOSCOPY  10/13/2017   Dr Raynelle Bring [facility])  . DUPUYTREN CONTRACTURE RELEASE Right 05/22/2014   Procedure: DUPUYTREN RELEASE AND REPAIR AS NECESSARY RIGHT RING FINGER AND MIDDLE FINGER;  Surgeon: Roseanne Kaufman, MD;  Location: [place];  Service: Orthopedics;  Laterality: Right;  . ESOPHAGOGASTRODUODENOSCOPY  04/27/2010   Dr  Cristina Gong - found transient H/H & Schatzki's ring  . ESOPHAGOGASTRODUODENOSCOPY ENDOSCOPY  03/06/2012   Dr Cristina Gong - found non-obstuctive Schatzki's ring at [employer] o/w normal EGD.   Marland Kitchen EYE SURGERY     bilateral cataracts  . KNEE ARTHROSCOPY W/ SYNOVECTOMY  11/2009, left knee   Dr Wynelle Link  . NEPHRECTOMY  For transition cell cancer    Dr Tresa Endo, surgeon  . PACEMAKER GENERATOR CHANGE N/A 11/12/2012   Procedure: PACEMAKER GENERATOR CHANGE;  Surgeon: Evans Lance,  MD; Medtronic Sensia dual-chamber pacemaker serial number [id number]; Laterality: Right  . PACEMAKER INSERTION  2005   Dr Doreatha Lew  . PACEMAKER LEAD REMOVAL  2005   Removal and reinsertion of atrial and ventricular leads due to migration  . REPLACEMENT TOTAL KNEE  2009, Right knee   Dr Wynelle Link  . REPLACEMENT TOTAL KNEE  05/2009, Left knee   Dr Wynelle Link  . TONSILLECTOMY    . TRIGGER FINGER RELEASE Right 05/22/2014   Procedure: RIGHT HAND A-1 PULLEY RELEASE ;  Surgeon: Roseanne Kaufman, MD;  Location: [place];  Service: Orthopedics;  Laterality: Right;    Current Medications: Current Meds  Medication Sig  . albuterol (PROVENTIL) (2.5 MG/3ML) 0.083% nebulizer solution USE 1 VIAL VIA NEBULIZER EVERY 4 HOURS AS NEEDED FOR WHEEZING OR SHORTNESS OF BREATH  . albuterol (VENTOLIN HFA) 108 (90 Base) MCG/ACT inhaler Inhale 2 puffs into the lungs every 4 (four) hours as needed for wheezing or shortness of breath.  Marland Kitchen atorvastatin (LIPITOR) 20 MG tablet TAKE 1 TABLET BY MOUTH DAILY  . ELIQUIS 2.5 MG TABS tablet TAKE 1 TABLET(2.5 MG) BY MOUTH TWICE DAILY  . fluticasone (FLONASE) 50 MCG/ACT nasal spray SHAKE LIQUID AND USE 2 SPRAYS IN EACH NOSTRIL DAILY  . Fluticasone-Umeclidin-Vilant (TRELEGY ELLIPTA) 100-62.5-25 MCG/INH AEPB Inhale 1 puff into the lungs daily.  . furosemide (LASIX) 40 MG tablet TAKE 1/2 TABLET(20 MG) BY MOUTH DAILY AS NEEDED FOR FLUID RETENTION OR SWELLING  . HYDROcodone-acetaminophen (NORCO/VICODIN) 5-325 MG tablet Take 1 tablet  by mouth every 4 (four) hours as needed.  . metoprolol succinate (TOPROL-XL) 25 MG 24 hr tablet TAKE 1 TABLET BY MOUTH DAILY  . Misc. Devices (3-IN-1 BEDSIDE TOILET) MISC 1 Device by Does not apply route daily.  . nitroGLYCERIN (NITROSTAT) 0.4 MG SL tablet Place 1 tablet (0.4 mg total) under the tongue every 5 (five) minutes as needed for chest pain (x 3 tabs).  Marland Kitchen omeprazole (PRILOSEC) 20 MG capsule TAKE 1 CAPSULE(20 MG) BY MOUTH DAILY  . Spacer/Aero-Holding Chambers (AEROCHAMBER MV) inhaler Use as instructed  . traMADol (ULTRAM) 50 MG tablet TAKE 1 TABLET BY MOUTH EVERY 6 HOURS AS NEEDED FOR PAIN  . [DISCONTINUED] amiodarone (PACERONE) 200 MG tablet Take 1 tablet (200 mg total) by mouth daily.     Allergies:   Baclofen, Codeine phosphate, Hydrochlorothiazide, Montelukast sodium, and Norco [hydrocodone-acetaminophen]   Social History   Socioeconomic History  . Marital status: Widowed    Spouse name: Not on file  . Number of children: 4  . Years of education: Not on file  . Highest education level: Not on file  Occupational History  . Occupation: Art therapist: RETIRED    Comment: Retired  Tobacco Use  . Smoking status: Former Smoker    Packs/day: 1.00    Types: Cigarettes    Quit date: 03/02/2009    Years since quitting: 10.4  . Smokeless tobacco: Never Used  Vaping Use  . Vaping Use: Never used  Substance and Sexual Activity  . Alcohol use: Yes    Alcohol/week: 13.0 standard drinks    Types: 7 Glasses of wine, 6 Standard drinks or equivalent per week    Comment: 5 oz white wine per day  . Drug use: No  . Sexual activity: Never  Other Topics Concern  . 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    Semi-retired Futures trader.     Pt has home nebulizer for inhalation therapies.    Former Smoker   Smoking Status:  quit > 5 years ago      (+) DNR status per discussion with Dr McDiarmid 10/25/12 and reiterated 06/20/13 office visit .   (+) Living Will/Advance Directive   (+) HC-POA: Cecile Sheerer Causey (pt's dgt)      Current Social History 09/22/2016        Patient lives with daughter, [name] (flight attendant), in two level home 09/22/2016   Transportation: Patient has own vehicle 09/22/2016   Important Relationships 4 daughters and 63 yo grandson 09/22/2016    Pets: None 09/22/2016   Education / Work:  College/ Retired Futures trader 09/22/2016   Interests / Fun: Reads 2 books per week, watches TV, entertains 09/22/2016   Current Stressors: 86 yo house in need of outside repairs 09/22/2016   Religious / Personal Beliefs: Joanie Coddington 09/22/2016   Other: Had a wonderful marriage/ Very thankful/ wants to continue traveling/ closest friends are all gone 09/22/2016   L. [place]atte, RN, BSN                                                                                                  Social Determinants of Health   Financial Resource Strain:   . Difficulty of Paying Living Expenses:   Food Insecurity:   . Worried About Charity fundraiser in the Last Year:   . Arboriculturist in the Last Year:   Transportation Needs:   . Film/video editor (Medical):   Marland Kitchen Lack of Transportation (Non-Medical):   Physical Activity:   . Days of Exercise per Week:   . Minutes of Exercise per Session:   Stress:   . Feeling of Stress :   Social Connections:   . Frequency of Communication with Friends and Family:   . Frequency of Social Gatherings with Friends and Family:   . Attends Religious Services:   . Active Member of Clubs or Organizations:   . Attends Archivist Meetings:   Marland Kitchen Marital Status:      Family History: The patient's family history includes Dementia in her mother; Heart attack in her father; Heart disease in her father.  ROS:   Please see the  history of present illness.    Hard of hearing.  Appetite is been stable.  Denies chills and fever.  All other systems reviewed and are negative.  EKGs/Labs/Other Studies Reviewed:    The following studies were reviewed today: 2D Doppler echocardiogram June 2021: IMPRESSIONS    1. Left ventricular ejection fraction, by estimation, is 50 to 55%. The  left ventricle has low normal function. The left ventricle has no regional  wall motion abnormalities. There is mild left ventricular hypertrophy of  the basal-septal segment. Left  ventricular diastolic parameters are indeterminate.  2. Right ventricular systolic function is normal. The right ventricular  size is normal. There is moderately elevated pulmonary  artery systolic  pressure. The estimated right ventricular systolic pressure is 12.7 mmHg.  3. Left atrial size was severely dilated.  4. The mitral valve is normal in structure. Mild to moderate mitral valve  regurgitation. No evidence of mitral stenosis.  5. Tricuspid valve regurgitation is moderate.  6. The aortic valve is tricuspid. Aortic valve regurgitation is not  visualized. Mild aortic valve sclerosis is present, with no evidence of  aortic valve stenosis.  7. The inferior vena cava is normal in size with greater than 50%  respiratory variability, suggesting right atrial pressure of 3 mmHg.  8. Compared to prior echo, PASP has increased from 18 to 6mmHg.   EKG:  EKG atrial fib, left anterior hemiblock, occasional pacing, ventricular rate 97 bpm.  Recent Labs: 08/28/2018: BNP 497.3 05/16/2019: ALT 35 07/02/2019: TSH 3.110 07/19/2019: Hemoglobin 10.1; Platelets 233 07/26/2019: BUN 48; Creatinine, Ser 2.01; Potassium 4.9; Sodium 141  Recent Lipid Panel    Component Value Date/Time   CHOL 130 08/11/2016 1101   TRIG 106 08/11/2016 1101   HDL 37 (L) 08/11/2016 1101   CHOLHDL 3.5 08/11/2016 1101   CHOLHDL 3.4 07/30/2015 1231   VLDL 29 07/30/2015 1231   LDLCALC 72  08/11/2016 1101   LDLDIRECT 59 11/29/2018 1310   LDLDIRECT 74 05/08/2014 1110    Physical Exam:    VS:  BP 116/64   Pulse 97   Ht 5\' 5"  (1.651 m)   Wt 174 lb 12.8 oz (79.3 kg)   SpO2 98%   BMI 29.09 kg/m     Wt Readings from Last 3 Encounters:  08/26/19 174 lb 12.8 oz (79.3 kg)  08/16/19 175 lb 3.2 oz (79.5 kg)  07/26/19 168 lb (76.2 kg)     GEN: Elderly and frail. No acute distress HEENT: Normal NECK: No JVD. LYMPHATICS: No lymphadenopathy CARDIAC: Irregularly irregular RR without murmur, gallop, or edema. VASCULAR: Bilateral lower extremity wrapping with out significant edema.  Normal Pulses. No bruits. RESPIRATORY:  Clear to auscultation without rales, wheezing or rhonchi  ABDOMEN: Soft, non-tender, non-distended, No pulsatile mass, MUSCULOSKELETAL: No deformity  SKIN: Warm and dry NEUROLOGIC:  Alert and oriented x 3 PSYCHIATRIC:  Normal affect   ASSESSMENT:    1. Chronic diastolic CHF (congestive heart failure) (HCC)   2. Paroxysmal atrial fibrillation ([place])   3. On amiodarone therapy   4. Leg swelling   5. Chronic kidney disease (CKD), stage IV (severe) ([place])   6. CAD S/P RCA BMS, residual CFX disease   7. Pacemaker   8. Chronic anticoagulation   9. Educated about COVID-19 virus infection    PLAN:    In order of problems listed above:  1. I would use diuretic therapy to manage lower extremity edema in this patient who has chronic diastolic failure.  She seems to be doing okay at this time and I think it is appropriate to continue to use furosemide intermittently.  I would not allow her to become severely volume overloaded because of concerns about kidney function. 2. She is in persistent atrial fibrillation on every EKG done over the past 12 months.  She is on amiodarone which will be discontinued.  Continue Toprol-XL and increase dose as needed if heart rate increases. 3. I have recommended discontinuation of amiodarone 4. Leg swelling is improved with  wrapping.  Also recommended leg elevation.  Diuretic therapy as needed to control edema. 5. Not assessed 6. No symptoms of angina 7. Normal pacer function 8. Continue low-dose Eliquis. 9. She  has been vaccinated for COVID-19   Medication Adjustments/Labs and Tests Ordered: Current medicines are reviewed at length with the patient today.  Concerns regarding medicines are outlined above.  Orders Placed This Encounter  Procedures  . EKG 12-Lead   No orders of the defined types were placed in this encounter.   Patient Instructions  Medication Instructions:  1) DISCONTINUE Amiodarone  *If you need a refill on your cardiac medications before your next appointment, please call your pharmacy*   Lab Work: None If you have labs (blood work) drawn today and your tests are completely normal, you will receive your results only by: Marland Kitchen MyChart Message (if you have MyChart) OR . A paper copy in the mail If you have any lab test that is abnormal or we need to change your treatment, we will call you to review the results.   Testing/Procedures: None   Follow-Up: At [facility], you and your health needs are our priority.  As part of our continuing mission to provide you with exceptional heart care, we have created designated Provider Care Teams.  These Care Teams include your primary Cardiologist (physician) and Advanced Practice Providers (APPs -  Physician Assistants and Nurse Practitioners) who all work together to provide you with the care you need, when you need it.  We recommend signing up for the patient portal called "MyChart".  Sign up information is provided on this After Visit Summary.  MyChart is used to connect with patients for Virtual Visits (Telemedicine).  Patients are able to view lab/test results, encounter notes, upcoming appointments, etc.  Non-urgent messages can be sent to your provider as well.   To learn more about what you can do with MyChart, go to  [email or web address].    Your next appointment:   12 month(s)  The format for your next appointment:   In Person  Provider:   You may see Sinclair Grooms, MD or one of the following Advanced Practice Providers on your designated Care Team:    Truitt Merle, NP  Cecilie Kicks, NP  Kathyrn Drown, NP    Other Instructions      Signed, Sinclair Grooms, MD  08/26/2019 3:08 PM    [place]

## 2019-08-26 ENCOUNTER — Other Ambulatory Visit: Payer: Self-pay

## 2019-08-26 ENCOUNTER — Telehealth: Payer: Self-pay

## 2019-08-26 ENCOUNTER — Ambulatory Visit: Payer: Medicare Other | Admitting: Cardiology

## 2019-08-26 ENCOUNTER — Ambulatory Visit (INDEPENDENT_AMBULATORY_CARE_PROVIDER_SITE_OTHER): Payer: Medicare Other | Admitting: Interventional Cardiology

## 2019-08-26 ENCOUNTER — Encounter: Payer: Self-pay | Admitting: Interventional Cardiology

## 2019-08-26 VITALS — BP 116/64 | HR 97 | Ht 65.0 in | Wt 174.8 lb

## 2019-08-26 DIAGNOSIS — I480 Paroxysmal atrial fibrillation: Secondary | ICD-10-CM | POA: Diagnosis not present

## 2019-08-26 DIAGNOSIS — Z79899 Other long term (current) drug therapy: Secondary | ICD-10-CM | POA: Diagnosis not present

## 2019-08-26 DIAGNOSIS — M7989 Other specified soft tissue disorders: Secondary | ICD-10-CM

## 2019-08-26 DIAGNOSIS — Z9861 Coronary angioplasty status: Secondary | ICD-10-CM | POA: Diagnosis not present

## 2019-08-26 DIAGNOSIS — Z950 Presence of cardiac pacemaker: Secondary | ICD-10-CM

## 2019-08-26 DIAGNOSIS — Z7189 Other specified counseling: Secondary | ICD-10-CM

## 2019-08-26 DIAGNOSIS — Z7901 Long term (current) use of anticoagulants: Secondary | ICD-10-CM

## 2019-08-26 DIAGNOSIS — I5032 Chronic diastolic (congestive) heart failure: Secondary | ICD-10-CM | POA: Diagnosis not present

## 2019-08-26 DIAGNOSIS — I2510 Atherosclerotic heart disease of native coronary artery without angina pectoris: Secondary | ICD-10-CM | POA: Diagnosis not present

## 2019-08-26 DIAGNOSIS — N184 Chronic kidney disease, stage 4 (severe): Secondary | ICD-10-CM

## 2019-08-26 NOTE — Patient Instructions (Signed)
Medication Instructions:  1) DISCONTINUE Amiodarone  *If you need a refill on your cardiac medications before your next appointment, please call your pharmacy*   Lab Work: None If you have labs (blood work) drawn today and your tests are completely normal, you will receive your results only by: Marland Kitchen MyChart Message (if you have MyChart) OR . A paper copy in the mail If you have any lab test that is abnormal or we need to change your treatment, we will call you to review the results.   Testing/Procedures: None   Follow-Up: At [facility], you and your health needs are our priority.  As part of our continuing mission to provide you with exceptional heart care, we have created designated Provider Care Teams.  These Care Teams include your primary Cardiologist (physician) and Advanced Practice Providers (APPs -  Physician Assistants and Nurse Practitioners) who all work together to provide you with the care you need, when you need it.  We recommend signing up for the patient portal called "MyChart".  Sign up information is provided on this After Visit Summary.  MyChart is used to connect with patients for Virtual Visits (Telemedicine).  Patients are able to view lab/test results, encounter notes, upcoming appointments, etc.  Non-urgent messages can be sent to your provider as well.   To learn more about what you can do with MyChart, go to [email or web address].    Your next appointment:   12 month(s)  The format for your next appointment:   In Person  Provider:   You may see Sinclair Grooms, MD or one of the following Advanced Practice Providers on your designated Care Team:    Truitt Merle, NP  Cecilie Kicks, NP  Kathyrn Drown, NP    Other Instructions

## 2019-08-26 NOTE — Telephone Encounter (Signed)
I spoke with the pt daughter [name]. She states they did not get the monitor to work and did not call tech support. She also said she no longer know where the monitor is located at. I told her I could order the pt a new monitor. I verified the address and told her I will put a note in the chart that the pt monitor is not working.  I called [employer] and ordered the pt a new monitor. They should receive it in 7-10 business days.

## 2019-08-26 NOTE — Telephone Encounter (Signed)
-----   Message from Loren Racer, RN sent at 08/26/2019 11:08 AM EDT ----- Dr. [name] is seeing this pt today. He was wondering if someone can look at her readings since her Cardioversion and see how often and when she went back into AF.  He's trying to decide whether or not he can stop her Amiodarone.    Thanks [employer]

## 2019-08-27 ENCOUNTER — Other Ambulatory Visit: Payer: Self-pay | Admitting: Family Medicine

## 2019-08-27 DIAGNOSIS — S81802D Unspecified open wound, left lower leg, subsequent encounter: Secondary | ICD-10-CM | POA: Diagnosis not present

## 2019-08-27 DIAGNOSIS — W1849XD Other slipping, tripping and stumbling without falling, subsequent encounter: Secondary | ICD-10-CM | POA: Diagnosis not present

## 2019-08-27 DIAGNOSIS — I872 Venous insufficiency (chronic) (peripheral): Secondary | ICD-10-CM | POA: Diagnosis not present

## 2019-08-27 DIAGNOSIS — I2510 Atherosclerotic heart disease of native coronary artery without angina pectoris: Secondary | ICD-10-CM | POA: Diagnosis not present

## 2019-08-27 DIAGNOSIS — L97819 Non-pressure chronic ulcer of other part of right lower leg with unspecified severity: Secondary | ICD-10-CM | POA: Diagnosis not present

## 2019-08-27 DIAGNOSIS — I5032 Chronic diastolic (congestive) heart failure: Secondary | ICD-10-CM | POA: Diagnosis not present

## 2019-08-28 DIAGNOSIS — W1849XD Other slipping, tripping and stumbling without falling, subsequent encounter: Secondary | ICD-10-CM | POA: Diagnosis not present

## 2019-08-28 DIAGNOSIS — I5032 Chronic diastolic (congestive) heart failure: Secondary | ICD-10-CM | POA: Diagnosis not present

## 2019-08-28 DIAGNOSIS — I2510 Atherosclerotic heart disease of native coronary artery without angina pectoris: Secondary | ICD-10-CM | POA: Diagnosis not present

## 2019-08-28 DIAGNOSIS — L97819 Non-pressure chronic ulcer of other part of right lower leg with unspecified severity: Secondary | ICD-10-CM | POA: Diagnosis not present

## 2019-08-28 DIAGNOSIS — I872 Venous insufficiency (chronic) (peripheral): Secondary | ICD-10-CM | POA: Diagnosis not present

## 2019-08-28 DIAGNOSIS — S81802D Unspecified open wound, left lower leg, subsequent encounter: Secondary | ICD-10-CM | POA: Diagnosis not present

## 2019-08-30 ENCOUNTER — Encounter: Payer: Self-pay | Admitting: Family Medicine

## 2019-08-30 DIAGNOSIS — L97819 Non-pressure chronic ulcer of other part of right lower leg with unspecified severity: Secondary | ICD-10-CM | POA: Diagnosis not present

## 2019-08-30 DIAGNOSIS — S81802D Unspecified open wound, left lower leg, subsequent encounter: Secondary | ICD-10-CM | POA: Diagnosis not present

## 2019-08-30 DIAGNOSIS — I5032 Chronic diastolic (congestive) heart failure: Secondary | ICD-10-CM | POA: Diagnosis not present

## 2019-08-30 DIAGNOSIS — W1849XD Other slipping, tripping and stumbling without falling, subsequent encounter: Secondary | ICD-10-CM | POA: Diagnosis not present

## 2019-08-30 DIAGNOSIS — I2781 Cor pulmonale (chronic): Secondary | ICD-10-CM | POA: Insufficient documentation

## 2019-08-30 DIAGNOSIS — I2510 Atherosclerotic heart disease of native coronary artery without angina pectoris: Secondary | ICD-10-CM | POA: Diagnosis not present

## 2019-08-30 DIAGNOSIS — I872 Venous insufficiency (chronic) (peripheral): Secondary | ICD-10-CM | POA: Diagnosis not present

## 2019-09-01 DIAGNOSIS — Z79891 Long term (current) use of opiate analgesic: Secondary | ICD-10-CM | POA: Diagnosis not present

## 2019-09-01 DIAGNOSIS — I2510 Atherosclerotic heart disease of native coronary artery without angina pectoris: Secondary | ICD-10-CM | POA: Diagnosis not present

## 2019-09-01 DIAGNOSIS — Z7952 Long term (current) use of systemic steroids: Secondary | ICD-10-CM | POA: Diagnosis not present

## 2019-09-01 DIAGNOSIS — S81802D Unspecified open wound, left lower leg, subsequent encounter: Secondary | ICD-10-CM | POA: Diagnosis not present

## 2019-09-01 DIAGNOSIS — Z905 Acquired absence of kidney: Secondary | ICD-10-CM | POA: Diagnosis not present

## 2019-09-01 DIAGNOSIS — N184 Chronic kidney disease, stage 4 (severe): Secondary | ICD-10-CM | POA: Diagnosis not present

## 2019-09-01 DIAGNOSIS — I5032 Chronic diastolic (congestive) heart failure: Secondary | ICD-10-CM | POA: Diagnosis not present

## 2019-09-01 DIAGNOSIS — J449 Chronic obstructive pulmonary disease, unspecified: Secondary | ICD-10-CM | POA: Diagnosis not present

## 2019-09-01 DIAGNOSIS — M48061 Spinal stenosis, lumbar region without neurogenic claudication: Secondary | ICD-10-CM | POA: Diagnosis not present

## 2019-09-01 DIAGNOSIS — I4891 Unspecified atrial fibrillation: Secondary | ICD-10-CM | POA: Diagnosis not present

## 2019-09-01 DIAGNOSIS — W1849XD Other slipping, tripping and stumbling without falling, subsequent encounter: Secondary | ICD-10-CM | POA: Diagnosis not present

## 2019-09-01 DIAGNOSIS — H3530 Unspecified macular degeneration: Secondary | ICD-10-CM | POA: Diagnosis not present

## 2019-09-01 DIAGNOSIS — Z4800 Encounter for change or removal of nonsurgical wound dressing: Secondary | ICD-10-CM | POA: Diagnosis not present

## 2019-09-01 DIAGNOSIS — L97819 Non-pressure chronic ulcer of other part of right lower leg with unspecified severity: Secondary | ICD-10-CM | POA: Diagnosis not present

## 2019-09-01 DIAGNOSIS — I872 Venous insufficiency (chronic) (peripheral): Secondary | ICD-10-CM | POA: Diagnosis not present

## 2019-09-01 DIAGNOSIS — Z950 Presence of cardiac pacemaker: Secondary | ICD-10-CM | POA: Diagnosis not present

## 2019-09-01 DIAGNOSIS — Z7901 Long term (current) use of anticoagulants: Secondary | ICD-10-CM | POA: Diagnosis not present

## 2019-09-01 DIAGNOSIS — F0390 Unspecified dementia without behavioral disturbance: Secondary | ICD-10-CM | POA: Diagnosis not present

## 2019-09-03 ENCOUNTER — Encounter (HOSPITAL_BASED_OUTPATIENT_CLINIC_OR_DEPARTMENT_OTHER): Payer: Medicare Other | Attending: Internal Medicine | Admitting: Internal Medicine

## 2019-09-03 DIAGNOSIS — I2510 Atherosclerotic heart disease of native coronary artery without angina pectoris: Secondary | ICD-10-CM | POA: Diagnosis not present

## 2019-09-03 DIAGNOSIS — N184 Chronic kidney disease, stage 4 (severe): Secondary | ICD-10-CM | POA: Diagnosis not present

## 2019-09-03 DIAGNOSIS — J449 Chronic obstructive pulmonary disease, unspecified: Secondary | ICD-10-CM | POA: Insufficient documentation

## 2019-09-03 DIAGNOSIS — L97821 Non-pressure chronic ulcer of other part of left lower leg limited to breakdown of skin: Secondary | ICD-10-CM | POA: Insufficient documentation

## 2019-09-03 DIAGNOSIS — L97311 Non-pressure chronic ulcer of right ankle limited to breakdown of skin: Secondary | ICD-10-CM | POA: Insufficient documentation

## 2019-09-03 DIAGNOSIS — I509 Heart failure, unspecified: Secondary | ICD-10-CM | POA: Insufficient documentation

## 2019-09-03 DIAGNOSIS — I872 Venous insufficiency (chronic) (peripheral): Secondary | ICD-10-CM | POA: Diagnosis not present

## 2019-09-03 DIAGNOSIS — Z7901 Long term (current) use of anticoagulants: Secondary | ICD-10-CM | POA: Insufficient documentation

## 2019-09-03 DIAGNOSIS — L97811 Non-pressure chronic ulcer of other part of right lower leg limited to breakdown of skin: Secondary | ICD-10-CM | POA: Insufficient documentation

## 2019-09-03 DIAGNOSIS — I4891 Unspecified atrial fibrillation: Secondary | ICD-10-CM | POA: Diagnosis not present

## 2019-09-03 DIAGNOSIS — I890 Lymphedema, not elsewhere classified: Secondary | ICD-10-CM | POA: Insufficient documentation

## 2019-09-03 DIAGNOSIS — I252 Old myocardial infarction: Secondary | ICD-10-CM | POA: Insufficient documentation

## 2019-09-03 DIAGNOSIS — H3530 Unspecified macular degeneration: Secondary | ICD-10-CM | POA: Diagnosis not present

## 2019-09-03 NOTE — Progress Notes (Signed)
[name], [name] ([id number]) Visit Report for 09/03/2019 HPI Details Patient Name: Date of Service: [name], Tammy NN M. 09/03/2019 12:45 PM Medical Record Number: [id number] Patient Account Number: [id number] Date of Birth/Sex: Treating RN: [birth year] (86 y.o. Nancy Fetter Primary Care Provider: MCDIA RMID, TO DD Other Clinician: Referring Provider: Treating Provider/Extender: Linton Ham MCDIA RMID, TO DD Weeks in Treatment: 2 History of Present Illness HPI Description: ADMISSION 08/19/2019 This is Tammy 86 year old woman who is here for bilateral lower leg wounds. Looking through her history she saw vascular surgery at [facility] in August 2020 diagnosed with lymphedema. She is here with 2 wounds on each of her lower legs. She does have Tammy history of using compression stockings nevertheless these wounds opened up about 2 months ago without obvious cause. She saw her primary physician on 07/26/2019 noticed the wounds. Wondered whether she might be in heart failure and tried to arrange consult with Dr. [name] of cardiology. She had Tammy DVT rule out on 6/10 that was negative. Previously had reflux studies done in 2019. She has 4 wounds 2 on the right which includes the right anterior lower extremity and the right medial malleolus and 2 on the left which includes the left anterior lower extremity and the most extensive wound is on the left lateral lower extremity. She does not describe claudication pain. Past medical history includes Tammy. fib on Eliquis, congestive heart failure, iron deficiency anemia related to chronic blood loss, chronic kidney disease stage IV, bilateral lower extremity lymphedema, macular degeneration, COPD. She is Tammy minimal ambulator. Last ejection fraction I thought was an EF of 50 to 55% ABI on the right at 0.93 on the left at 0.85 09/03/2019; most of the patient's wounds are healed including the right ankle and left lateral. She has Tammy new area which was caused by Tammy problem removing  the wrap by home health on the right anterior distal lower extremity. She still has Tammy small area on the left anterior. We are using Hydrofera Blue under compression. She did not bring her stockings today Electronic Signature(s) Signed: 09/03/2019 5:09:59 PM By: Linton Ham MD Entered By: Linton Ham on 09/03/2019 13:48:41 -------------------------------------------------------------------------------- Physical Exam Details Patient Name: Date of Service: [name], Tammy NN M. 09/03/2019 12:45 PM Medical Record Number: [id number] Patient Account Number: [id number] Date of Birth/Sex: Treating RN: Nov 09, 1923 (86 y.o. Nancy Fetter Primary Care Provider: MCDIA RMID, TO DD Other Clinician: Referring Provider: Treating Provider/Extender: Linton Ham MCDIA RMID, TO DD Weeks in Treatment: 2 Constitutional Sitting or standing Blood Pressure is within target range for patient.. Pulse regular and within target range for patient.Marland Kitchen Respirations regular, non-labored and within target range.. Temperature is normal and within the target range for the patient.Marland Kitchen Appears in no distress. Cardiovascular Pedal pulses are palpable bilaterally. Edema bilaterally secondary to chronic venous insufficiency with lymphedema but her edema control is quite good. Notes Wound exam The patient has several of her original wounds closed. She has Tammy new area on the right anterior distal and the original area on the left anterior upper tibial area. No debridement is required. The new area on the right is Tammy small skin tear Electronic Signature(s) Signed: 09/03/2019 5:09:59 PM By: Linton Ham MD Entered By: Linton Ham on 09/03/2019 13:50:37 -------------------------------------------------------------------------------- Physician Orders Details Patient Name: Date of Service: [name], Tammy NN M. 09/03/2019 12:45 PM Medical Record Number: [id number] Patient Account Number: [id number] Date of Birth/Sex: Treating  RN: 08/29/1923 (86 y.o. Nancy Fetter Primary  Care Provider: MCDIA RMID, TO DD Other Clinician: Referring Provider: Treating Provider/Extender: Linton Ham MCDIA RMID, TO DD Weeks in Treatment: 2 Verbal / Phone Orders: No Diagnosis Coding ICD-10 Coding Code Description I89.0 Lymphedema, not elsewhere classified L97.811 Non-pressure chronic ulcer of other part of right lower leg limited to breakdown of skin L97.311 Non-pressure chronic ulcer of right ankle limited to breakdown of skin L97.821 Non-pressure chronic ulcer of other part of left lower leg limited to breakdown of skin Follow-up Appointments Return Appointment in 1 week. Dressing Change Frequency Wound #3 Left,Proximal,Anterior Lower Leg Change dressing three times week. - home health can change 2x Tammy week on week that patient comes to wound center Wound #5 Right,Distal,Anterior Lower Leg Change dressing three times week. - home health can change 2x Tammy week on week that patient comes to wound center Skin Barriers/Peri-Wound Care Moisturizing lotion - both legs Wound Cleansing May shower with protection. - may use cast protector Primary Wound Dressing Wound #3 Left,Proximal,Anterior Lower Leg Hydrofera Blue Wound #5 Right,Distal,Anterior Lower Leg Hydrofera Blue Secondary Dressing Wound #3 Left,Proximal,Anterior Lower Leg Dry Gauze Wound #5 Right,Distal,Anterior Lower Leg Dry Gauze Edema Control 4 layer compression - Bilateral Avoid standing for long periods of time Elevate legs to the level of the heart or above for 30 minutes daily and/or when sitting, Tammy frequency of: - throughout the day Exercise regularly [place] skilled nursing for wound care. Nanine Means Electronic Signature(s) Signed: 09/03/2019 5:09:59 PM By: Linton Ham MD Signed: 09/03/2019 5:26:11 PM By: Levan Hurst RN, BSN Entered By: Levan Hurst on 09/03/2019  13:41:11 -------------------------------------------------------------------------------- Problem List Details Patient Name: Date of Service: [name], Tammy NN M. 09/03/2019 12:45 PM Medical Record Number: [id number] Patient Account Number: [id number] Date of Birth/Sex: Treating RN: 02-27-1924 (86 y.o. Nancy Fetter Primary Care Provider: MCDIA RMID, TO DD Other Clinician: Referring Provider: Treating Provider/Extender: Linton Ham MCDIA RMID, TO DD Weeks in Treatment: 2 Active Problems ICD-10 Encounter Code Description Active Date MDM Diagnosis I89.0 Lymphedema, not elsewhere classified 08/19/2019 No Yes L97.811 Non-pressure chronic ulcer of other part of right lower leg limited to breakdown 08/19/2019 No Yes of skin L97.821 Non-pressure chronic ulcer of other part of left lower leg limited to breakdown 08/19/2019 No Yes of skin Inactive Problems Resolved Problems ICD-10 Code Description Active Date Resolved Date L97.311 Non-pressure chronic ulcer of right ankle limited to breakdown of skin 08/19/2019 08/19/2019 Electronic Signature(s) Signed: 09/03/2019 5:09:59 PM By: Linton Ham MD Entered By: Linton Ham on 09/03/2019 13:44:40 -------------------------------------------------------------------------------- Progress Note Details Patient Name: Date of Service: [name], Tammy NN M. 09/03/2019 12:45 PM Medical Record Number: [id number] Patient Account Number: [id number] Date of Birth/Sex: Treating RN: [birth year] (86 y.o. Nancy Fetter Primary Care Provider: MCDIA RMID, TO DD Other Clinician: Referring Provider: Treating Provider/Extender: Linton Ham MCDIA RMID, TO DD Weeks in Treatment: 2 Subjective History of Present Illness (HPI) ADMISSION 08/19/2019 This is Tammy 86 year old woman who is here for bilateral lower leg wounds. Looking through her history she saw vascular surgery at [facility] in August 2020 diagnosed with lymphedema. She is here with 2 wounds on each of  her lower legs. She does have Tammy history of using compression stockings nevertheless these wounds opened up about 2 months ago without obvious cause. She saw her primary physician on 07/26/2019 noticed the wounds. Wondered whether she might be in heart failure and tried to arrange consult with Dr. [name] of cardiology. She had Tammy DVT rule out on 6/10  that was negative. Previously had reflux studies done in 2019. She has 4 wounds 2 on the right which includes the right anterior lower extremity and the right medial malleolus and 2 on the left which includes the left anterior lower extremity and the most extensive wound is on the left lateral lower extremity. She does not describe claudication pain. Past medical history includes Tammy. fib on Eliquis, congestive heart failure, iron deficiency anemia related to chronic blood loss, chronic kidney disease stage IV, bilateral lower extremity lymphedema, macular degeneration, COPD. She is Tammy minimal ambulator. Last ejection fraction I thought was an EF of 50 to 55% ABI on the right at 0.93 on the left at 0.85 09/03/2019; most of the patient's wounds are healed including the right ankle and left lateral. She has Tammy new area which was caused by Tammy problem removing the wrap by home health on the right anterior distal lower extremity. She still has Tammy small area on the left anterior. We are using Hydrofera Blue under compression. She did not bring her stockings today Objective Constitutional Sitting or standing Blood Pressure is within target range for patient.. Pulse regular and within target range for patient.Marland Kitchen Respirations regular, non-labored and within target range.. Temperature is normal and within the target range for the patient.Marland Kitchen Appears in no distress. Vitals Time Taken: 1:06 PM, Height: 65 in, Source: Stated, Weight: 173 lbs, Source: Stated, BMI: 28.8, Temperature: 97.6 F, Pulse: 96 bpm, Respiratory Rate: 20 breaths/min, Blood Pressure: 120/80  mmHg. Cardiovascular Pedal pulses are palpable bilaterally. Edema bilaterally secondary to chronic venous insufficiency with lymphedema but her edema control is quite good. General Notes: Wound exam ooThe patient has several of her original wounds closed. She has Tammy new area on the right anterior distal and the original area on the left anterior upper tibial area. No debridement is required. The new area on the right is Tammy small skin tear Integumentary (Hair, Skin) Wound #1 status is Healed - Epithelialized. Original cause of wound was Gradually Appeared. The wound is located on the Right,Anterior Lower Leg. The wound measures 0cm length x 0cm width x 0cm depth; 0cm^2 area and 0cm^3 volume. There is no tunneling or undermining noted. There is Tammy none present amount of drainage noted. The wound margin is indistinct and nonvisible. There is no granulation within the wound bed. There is no necrotic tissue within the wound bed. Wound #2 status is Healed - Epithelialized. Original cause of wound was Gradually Appeared. The wound is located on the Right,Medial Malleolus. The wound measures 0cm length x 0cm width x 0cm depth; 0cm^2 area and 0cm^3 volume. There is no tunneling or undermining noted. There is Tammy none present amount of drainage noted. The wound margin is indistinct and nonvisible. There is no granulation within the wound bed. There is no necrotic tissue within the wound bed. Wound #3 status is Open. Original cause of wound was Gradually Appeared. The wound is located on the Left,Proximal,Anterior Lower Leg. The wound measures 0.3cm length x 0.2cm width x 0.1cm depth; 0.047cm^2 area and 0.005cm^3 volume. There is Fat Layer (Subcutaneous Tissue) Exposed exposed. There is no tunneling or undermining noted. There is Tammy small amount of serosanguineous drainage noted. The wound margin is flat and intact. There is large (67- 100%) red granulation within the wound bed. There is no necrotic tissue within the  wound bed. Wound #4 status is Healed - Epithelialized. Original cause of wound was Gradually Appeared. The wound is located on the Left,Lateral Lower Leg.  The wound measures 0cm length x 0cm width x 0cm depth; 0cm^2 area and 0cm^3 volume. There is no tunneling or undermining noted. There is Tammy none present amount of drainage noted. The wound margin is indistinct and nonvisible. There is no granulation within the wound bed. There is no necrotic tissue within the wound bed. Wound #5 status is Open. Original cause of wound was Trauma. The wound is located on the Right,Distal,Anterior Lower Leg. The wound measures 0.5cm length x 0.5cm width x 0.1cm depth; 0.196cm^2 area and 0.02cm^3 volume. There is Fat Layer (Subcutaneous Tissue) Exposed exposed. There is no tunneling or undermining noted. There is Tammy small amount of serosanguineous drainage noted. The wound margin is flat and intact. There is large (67-100%) red granulation within the wound bed. There is no necrotic tissue within the wound bed. Assessment Active Problems ICD-10 Lymphedema, not elsewhere classified Non-pressure chronic ulcer of other part of right lower leg limited to breakdown of skin Non-pressure chronic ulcer of other part of left lower leg limited to breakdown of skin Procedures Wound #3 Pre-procedure diagnosis of Wound #3 is Tammy Lymphedema located on the Left,Proximal,Anterior Lower Leg . There was Tammy Four Layer Compression Therapy Procedure by Levan Hurst, RN. Post procedure Diagnosis Wound #3: Same as Pre-Procedure Wound #5 Pre-procedure diagnosis of Wound #5 is Tammy Skin T located on the Right,Distal,Anterior Lower Leg . There was Tammy Four Layer Compression Therapy Procedure ear by Levan Hurst, RN. Post procedure Diagnosis Wound #5: Same as Pre-Procedure Plan Follow-up Appointments: Return Appointment in 1 week. Dressing Change Frequency: Wound #3 Left,Proximal,Anterior Lower Leg: Change dressing three times week. -  home health can change 2x Tammy week on week that patient comes to wound center Wound #5 Right,Distal,Anterior Lower Leg: Change dressing three times week. - home health can change 2x Tammy week on week that patient comes to wound center Skin Barriers/Peri-Wound Care: Moisturizing lotion - both legs Wound Cleansing: May shower with protection. - may use cast protector Primary Wound Dressing: Wound #3 Left,Proximal,Anterior Lower Leg: Hydrofera Blue Wound #5 Right,Distal,Anterior Lower Leg: Hydrofera Blue Secondary Dressing: Wound #3 Left,Proximal,Anterior Lower Leg: Dry Gauze Wound #5 Right,Distal,Anterior Lower Leg: Dry Gauze Edema Control: 4 layer compression - Bilateral Avoid standing for long periods of time Elevate legs to the level of the heart or above for 30 minutes daily and/or when sitting, Tammy frequency of: - throughout the day Exercise regularly Home Health: [facility] skilled [facility] for wound care. - [place] 1. We continue with Hydrofera Blue 2. Bilateral 4-layer compression 3. I told her to bring her stockings next week for [place] to look at. She says her about 5 to 76 months old although she only wore them intermittently Electronic Signature(s) Signed: 09/03/2019 5:09:59 PM By: Linton Ham MD Entered By: Linton Ham on 09/03/2019 13:53:02 -------------------------------------------------------------------------------- SuperBill Details Patient Name: Date of Service: [name], Tammy NN M. 09/03/2019 Medical Record Number: [id number] Patient Account Number: [id number] Date of Birth/Sex: Treating RN: 03-Oct-1923 (86 y.o. Nancy Fetter Primary Care Provider: MCDIA RMID, TO DD Other Clinician: Referring Provider: Treating Provider/Extender: Linton Ham MCDIA RMID, TO DD Weeks in Treatment: 2 Diagnosis Coding ICD-10 Codes Code Description I89.0 Lymphedema, not elsewhere classified L97.811 Non-pressure chronic ulcer of other part of right lower leg limited to  breakdown of skin L97.821 Non-pressure chronic ulcer of other part of left lower leg limited to breakdown of skin Facility Procedures Physician Procedures : CPT4 Code Description Modifier [id number] - WC PHYS LEVEL 3 -  EST PT ICD-10 Diagnosis Description I89.0 Lymphedema, not elsewhere classified L97.811 Non-pressure chronic ulcer of other part of right lower leg limited to breakdown of skin L97.821  Non-pressure chronic ulcer of other part of left lower leg limited to breakdown of skin Quantity: 1 Electronic Signature(s) Signed: 09/03/2019 5:09:59 PM By: Linton Ham MD Entered By: Linton Ham on 09/03/2019 13:53:21

## 2019-09-03 NOTE — Progress Notes (Signed)
SALLEY, Nahal Jerilynn Mages ([id number]) Visit Report for 09/03/2019 Arrival Information Details Patient Name: Date of Service: Tomma Lightning, A NN M. 09/03/2019 12:45 PM Medical Record Number: [id number] Patient Account Number: [id number] Date of Birth/Sex: Treating RN: [birth year] (86 y.o. Elam Dutch Primary Care Joseh Sjogren: MCDIA RMID, TO DD Other Clinician: Referring Marlane Hirschmann: Treating Savion Washam/Extender: Linton Ham MCDIA RMID, TO DD Weeks in Treatment: 2 Visit Information History Since Last Visit Added or deleted any medications: No Patient Arrived: Gilford Rile Any new allergies or adverse reactions: No Arrival Time: 13:04 Had a fall or experienced change in No Accompanied By: self activities of daily living that may affect Transfer Assistance: None risk of falls: Patient Identification Verified: Yes Signs or symptoms of abuse/neglect since last visito No Secondary Verification Process Completed: Yes Hospitalized since last visit: No Patient Requires Transmission-Based Precautions: No Implantable device outside of the clinic excluding No Patient Has Alerts: No cellular tissue based products placed in the center since last visit: Has Dressing in Place as Prescribed: Yes Has Compression in Place as Prescribed: Yes Pain Present Now: No Electronic Signature(s) Signed: 09/03/2019 5:06:40 PM By: Baruch Gouty RN, BSN Entered By: Baruch Gouty on 09/03/2019 13:05:54 -------------------------------------------------------------------------------- Compression Therapy Details Patient Name: Date of Service: Tomma Lightning, A NN M. 09/03/2019 12:45 PM Medical Record Number: [id number] Patient Account Number: [id number] Date of Birth/Sex: Treating RN: October 14, 1923 (86 y.o. Nancy Fetter Primary Care Ethelbert Thain: MCDIA RMID, TO DD Other Clinician: Referring Jenavive Lamboy: Treating Nylan Nevel/Extender: Linton Ham MCDIA RMID, TO DD Weeks in Treatment: 2 Compression Therapy Performed for Wound Assessment: Wound  #5 Right,Distal,Anterior Lower Leg Performed By: Clinician Levan Hurst, RN Compression Type: Four Layer Post Procedure Diagnosis Same as Pre-procedure Electronic Signature(s) Signed: 09/03/2019 5:26:11 PM By: Levan Hurst RN, BSN Entered By: Levan Hurst on 09/03/2019 13:42:07 -------------------------------------------------------------------------------- Compression Therapy Details Patient Name: Date of Service: Tomma Lightning, A NN M. 09/03/2019 12:45 PM Medical Record Number: [id number] Patient Account Number: [id number] Date of Birth/Sex: Treating RN: [birth year] (86 y.o. Nancy Fetter Primary Care Roshell Brigham: MCDIA RMID, TO DD Other Clinician: Referring Torin Whisner: Treating Kimra Kantor/Extender: Linton Ham MCDIA RMID, TO DD Weeks in Treatment: 2 Compression Therapy Performed for Wound Assessment: Wound #3 Left,Proximal,Anterior Lower Leg Performed By: Clinician Levan Hurst, RN Compression Type: Four Layer Post Procedure Diagnosis Same as Pre-procedure Electronic Signature(s) Signed: 09/03/2019 5:26:11 PM By: Levan Hurst RN, BSN Entered By: Levan Hurst on 09/03/2019 13:42:07 -------------------------------------------------------------------------------- Encounter Discharge Information Details Patient Name: Date of Service: Tomma Lightning, A NN M. 09/03/2019 12:45 PM Medical Record Number: [id number] Patient Account Number: [id number] Date of Birth/Sex: Treating RN: 1923-06-30 (86 y.o. Clearnce Sorrel Primary Care Laya Letendre: MCDIA RMID, TO DD Other Clinician: Referring Quanda Pavlicek: Treating Riham Polyakov/Extender: Linton Ham MCDIA RMID, TO DD Weeks in Treatment: 2 Encounter Discharge Information Items Discharge Condition: Stable Ambulatory Status: Walker Discharge Destination: Home Transportation: Private Auto Accompanied By: self Schedule Follow-up Appointment: Yes Clinical Summary of Care: Patient Declined Electronic Signature(s) Signed: 09/03/2019 5:18:40 PM By:  Kela Millin Entered By: Kela Millin on 09/03/2019 14:27:20 -------------------------------------------------------------------------------- Lower Extremity Assessment Details Patient Name: Date of Service: Tomma Lightning, A NN M. 09/03/2019 12:45 PM Medical Record Number: [id number] Patient Account Number: [id number] Date of Birth/Sex: Treating RN: Nov 26, 1923 (86 y.o. Elam Dutch Primary Care Zabdi Mis: MCDIA RMID, TO DD Other Clinician: Referring Matan Steen: Treating Janaria Mccammon/Extender: Linton Ham MCDIA RMID, TO DD Weeks in Treatment: 2 Edema Assessment Assessed: [Left: No] [Right: No] Edema: [Left: Yes] [Right: Yes] Calf Left: Right: Point of Measurement:  37 cm From Medial Instep 32.5 cm 31.3 cm Ankle Left: Right: Point of Measurement: 10 cm From Medial Instep 21.5 cm 21.5 cm Vascular Assessment Pulses: Dorsalis Pedis Palpable: [Left:Yes] [Right:Yes] Electronic Signature(s) Signed: 09/03/2019 5:06:40 PM By: Baruch Gouty RN, BSN Entered By: Baruch Gouty on 09/03/2019 13:23:49 -------------------------------------------------------------------------------- Multi Wound Chart Details Patient Name: Date of Service: Tomma Lightning, A NN M. 09/03/2019 12:45 PM Medical Record Number: [id number] Patient Account Number: [id number] Date of Birth/Sex: Treating RN: [birth year] (86 y.o. Nancy Fetter Primary Care Kanton Kamel: MCDIA RMID, TO DD Other Clinician: Referring Lourine Alberico: Treating Azyriah Nevins/Extender: Linton Ham MCDIA RMID, TO DD Weeks in Treatment: 2 Vital Signs Height(in): 4 Pulse(bpm): 71 Weight(lbs): 173 Blood Pressure(mmHg): 120/80 Body Mass Index(BMI): 29 Temperature(F): 97.6 Respiratory Rate(breaths/min): 20 Photos: [1:No Photos Right, Anterior Lower Leg] [2:No Photos Right, Medial Malleolus] [3:No Photos Left, Proximal, Anterior Lower Leg] Wound Location: [1:Gradually Appeared] [2:Gradually Appeared] [3:Gradually Appeared] Wounding Event:  [1:Lymphedema] [2:Lymphedema] [3:Lymphedema] Primary Etiology: [1:Chronic Obstructive Pulmonary] [2:Chronic Obstructive Pulmonary] [3:Chronic Obstructive Pulmonary] Comorbid History: [1:Disease (COPD), Arrhythmia, Congestive Heart Failure, Coronary Artery Disease, Myocardial Infarction 06/29/2019] [2:Disease (COPD), Arrhythmia, Congestive Heart Failure, Coronary Artery Disease, Myocardial Infarction 06/29/2019]  [3:Disease (COPD), Arrhythmia, Congestive Heart Failure, Coronary Artery Disease, Myocardial Infarction 05/30/2019] Date Acquired: [1:2] [2:2] [3:2] Weeks of Treatment: [1:Healed - Epithelialized] [2:Healed - Epithelialized] [3:Open] Wound Status: [1:0x0x0] [2:0x0x0] [3:0.3x0.2x0.1] Measurements L x W x D (cm) [1:0] [2:0] [3:0.047] A (cm) : rea [1:0] [2:0] [3:0.005] Volume (cm) : [1:100.00%] [2:100.00%] [3:96.10%] % Reduction in Area: [1:100.00%] [2:100.00%] [3:95.90%] % Reduction in Volume: [1:Full Thickness Without Exposed] [2:Full Thickness Without Exposed] [3:Full Thickness Without Exposed] Classification: [1:Support Structures None Present] [2:Support Structures None Present] [3:Support Structures Small] Exudate Amount: [1:N/A] [2:N/A] [3:Serosanguineous] Exudate Type: [1:N/A] [2:N/A] [3:red, brown] Exudate Color: [1:Indistinct, nonvisible] [2:Indistinct, nonvisible] [3:Flat and Intact] Wound Margin: [1:None Present (0%)] [2:None Present (0%)] [3:Large (67-100%)] Granulation Amount: [1:N/A] [2:N/A] [3:Red] Granulation Quality: [1:None Present (0%)] [2:None Present (0%)] [3:None Present (0%)] Necrotic Amount: [1:Fascia: No] [2:Fascia: No] [3:Fat Layer (Subcutaneous Tissue)] Exposed Structures: [1:Fat Layer (Subcutaneous Tissue) Exposed: No Tendon: No Muscle: No Joint: No Bone: No Large (67-100%)] [2:Fat Layer (Subcutaneous Tissue) Exposed: No Tendon: No Muscle: No Joint: No Bone: No Large (67-100%)] [3:Exposed: Yes Fascia: No Tendon: No  Muscle: No Joint: No Bone: No Medium  (34-66%)] Epithelialization: [1:N/A] [2:N/A] [3:Compression Therapy] Wound Number: 4 5 N/A Photos: No Photos No Photos N/A Left, Lateral Lower Leg Right, Distal, Anterior Lower Leg N/A Wound Location: Gradually Appeared Trauma N/A Wounding Event: Lymphedema Skin T ear N/A Primary Etiology: Chronic Obstructive Pulmonary Chronic Obstructive Pulmonary N/A Comorbid History: Disease (COPD), Arrhythmia, Disease (COPD), Arrhythmia, Congestive Heart Failure, Coronary Congestive Heart Failure, Coronary Artery Disease, Myocardial Infarction Artery Disease, Myocardial Infarction 05/30/2019 08/13/2019 N/A Date Acquired: 2 0 N/A Weeks of Treatment: Healed - Epithelialized Open N/A Wound Status: 0x0x0 0.5x0.5x0.1 N/A Measurements L x W x D (cm) 0 0.196 N/A A (cm) : rea 0 0.02 N/A Volume (cm) : 100.00% N/A N/A % Reduction in Area: 100.00% N/A N/A % Reduction in Volume: Full Thickness Without Exposed Full Thickness Without Exposed N/A Classification: Support Structures Support Structures None Present Small N/A Exudate Amount: N/A Serosanguineous N/A Exudate Type: N/A red, brown N/A Exudate Color: Indistinct, nonvisible Flat and Intact N/A Wound Margin: None Present (0%) Large (67-100%) N/A Granulation Amount: N/A Red N/A Granulation Quality: None Present (0%) None Present (0%) N/A Necrotic Amount: Fascia: No Fat Layer (Subcutaneous Tissue) N/A Exposed Structures: Fat Layer (Subcutaneous Tissue) Exposed:  Yes Exposed: No Fascia: No Tendon: No Tendon: No Muscle: No Muscle: No Joint: No Joint: No Bone: No Bone: No Large (67-100%) Small (1-33%) N/A Epithelialization: N/A Compression Therapy N/A Procedures Performed: Treatment Notes Electronic Signature(s) Signed: 09/03/2019 5:09:59 PM By: Linton Ham MD Signed: 09/03/2019 5:26:11 PM By: Levan Hurst RN, BSN Entered By: Linton Ham on 09/03/2019  13:44:48 -------------------------------------------------------------------------------- Multi-Disciplinary Care Plan Details Patient Name: Date of Service: Tomma Lightning, A NN M. 09/03/2019 12:45 PM Medical Record Number: [id number] Patient Account Number: [id number] Date of Birth/Sex: Treating RN: 04-11-1923 (86 y.o. Nancy Fetter Primary Care Morine Kohlman: MCDIA RMID, TO DD Other Clinician: Referring Donna Snooks: Treating Josephmichael Lisenbee/Extender: Linton Ham MCDIA RMID, TO DD Weeks in Treatment: 2 Active Inactive Abuse / Safety / Falls / Self Care Management Nursing Diagnoses: Potential for falls Potential for injury related to falls Goals: Patient will not experience any injury related to falls Date Initiated: 08/19/2019 Target Resolution Date: 09/20/2019 Goal Status: Active Patient/caregiver will verbalize/demonstrate measures taken to prevent injury and/or falls Date Initiated: 08/19/2019 Target Resolution Date: 09/20/2019 Goal Status: Active Interventions: Assess Activities of Daily Living upon admission and as needed Assess fall risk on admission and as needed Assess: immobility, friction, shearing, incontinence upon admission and as needed Assess impairment of mobility on admission and as needed per policy Assess personal safety and home safety (as indicated) on admission and as needed Assess self care needs on admission and as needed Provide education on fall prevention Provide education on personal and home safety Notes: Venous Leg Ulcer Nursing Diagnoses: Knowledge deficit related to disease process and management Potential for venous Insuffiency (use before diagnosis confirmed) Goals: Patient will maintain optimal edema control Date Initiated: 08/19/2019 Target Resolution Date: 09/20/2019 Goal Status: Active Patient/caregiver will verbalize understanding of disease process and disease management Date Initiated: 08/19/2019 Target Resolution Date: 09/20/2019 Goal Status:  Active Interventions: Assess peripheral edema status every visit. Compression as ordered Provide education on venous insufficiency Notes: Wound/Skin Impairment Nursing Diagnoses: Impaired tissue integrity Knowledge deficit related to ulceration/compromised skin integrity Goals: Patient/caregiver will verbalize understanding of skin care regimen Date Initiated: 08/19/2019 Target Resolution Date: 09/20/2019 Goal Status: Active Interventions: Assess patient/caregiver ability to obtain necessary supplies Assess patient/caregiver ability to perform ulcer/skin care regimen upon admission and as needed Assess ulceration(s) every visit Provide education on ulcer and skin care Notes: Electronic Signature(s) Signed: 09/03/2019 5:26:11 PM By: Levan Hurst RN, BSN Entered By: Levan Hurst on 09/03/2019 13:50:18 -------------------------------------------------------------------------------- Pain Assessment Details Patient Name: Date of Service: Tomma Lightning, A NN M. 09/03/2019 12:45 PM Medical Record Number: [id number] Patient Account Number: [id number] Date of Birth/Sex: Treating RN: December 11, 1923 (86 y.o. Elam Dutch Primary Care Isyss Espinal: MCDIA RMID, TO DD Other Clinician: Referring Aryan Bello: Treating Collyns Mcquigg/Extender: Linton Ham MCDIA RMID, TO DD Weeks in Treatment: 2 Active Problems Location of Pain Severity and Description of Pain Patient Has Paino No Site Locations Rate the pain. Current Pain Level: 0 Pain Management and Medication Current Pain Management: Electronic Signature(s) Signed: 09/03/2019 5:06:40 PM By: Baruch Gouty RN, BSN Entered By: Baruch Gouty on 09/03/2019 13:06:48 -------------------------------------------------------------------------------- Patient/Caregiver Education Details Patient Name: Date of Service: Tomma Lightning, A NN M. 7/6/2021andnbsp12:45 PM Medical Record Number: [id number] Patient Account Number: [id number] Date of  Birth/Gender: Treating RN: [birth year] (86 y.o. Nancy Fetter Primary Care Physician: MCDIA RMID, TO DD Other Clinician: Referring Physician: Treating Physician/Extender: Linton Ham MCDIA RMID, TO DD Weeks in Treatment: 2 Education Assessment Education Provided To: Patient Education Topics Provided Venous: Methods: Explain/Verbal Responses:  State content correctly Wound/Skin Impairment: Methods: Explain/Verbal Responses: State content correctly Electronic Signature(s) Signed: 09/03/2019 5:26:11 PM By: Levan Hurst RN, BSN Entered By: Levan Hurst on 09/03/2019 13:50:30 -------------------------------------------------------------------------------- Wound Assessment Details Patient Name: Date of Service: Tomma Lightning, A NN M. 09/03/2019 12:45 PM Medical Record Number: [id number] Patient Account Number: [id number] Date of Birth/Sex: Treating RN: 02/26/1924 (86 y.o. Elam Dutch Primary Care Tyris Eliot: MCDIA RMID, TO DD Other Clinician: Referring Atalaya Zappia: Treating Davante Gerke/Extender: Linton Ham MCDIA RMID, TO DD Weeks in Treatment: 2 Wound Status Wound Number: 1 Primary Lymphedema Etiology: Wound Location: Right, Anterior Lower Leg Wound Healed - Epithelialized Wounding Event: Gradually Appeared Status: Date Acquired: 06/29/2019 Comorbid Chronic Obstructive Pulmonary Disease (COPD), Arrhythmia, Weeks Of Treatment: 2 History: Congestive Heart Failure, Coronary Artery Disease, Myocardial Clustered Wound: No Infarction Wound Measurements Length: (cm) Width: (cm) Depth: (cm) Area: (cm) Volume: (cm) 0 % Reduction in Area: 100% 0 % Reduction in Volume: 100% 0 Epithelialization: Large (67-100%) 0 Tunneling: No 0 Undermining: No Wound Description Classification: Full Thickness Without Exposed Support Structures Wound Margin: Indistinct, nonvisible Exudate Amount: None Present Foul Odor After Cleansing: No Slough/Fibrino No Wound Bed Granulation  Amount: None Present (0%) Exposed Structure Necrotic Amount: None Present (0%) Fascia Exposed: No Fat Layer (Subcutaneous Tissue) Exposed: No Tendon Exposed: No Muscle Exposed: No Joint Exposed: No Bone Exposed: No Electronic Signature(s) Signed: 09/03/2019 5:06:40 PM By: Baruch Gouty RN, BSN Entered By: Baruch Gouty on 09/03/2019 13:26:02 -------------------------------------------------------------------------------- Wound Assessment Details Patient Name: Date of Service: Tomma Lightning, A NN M. 09/03/2019 12:45 PM Medical Record Number: [id number] Patient Account Number: [id number] Date of Birth/Sex: Treating RN: 1923-06-16 (86 y.o. Elam Dutch Primary Care Lashane Whelpley: MCDIA RMID, TO DD Other Clinician: Referring Chianne Byrns: Treating Satine Hausner/Extender: Linton Ham MCDIA RMID, TO DD Weeks in Treatment: 2 Wound Status Wound Number: 2 Primary Lymphedema Etiology: Wound Location: Right, Medial Malleolus Wound Healed - Epithelialized Wounding Event: Gradually Appeared Status: Date Acquired: 06/29/2019 Comorbid Chronic Obstructive Pulmonary Disease (COPD), Arrhythmia, Weeks Of Treatment: 2 History: Congestive Heart Failure, Coronary Artery Disease, Myocardial Clustered Wound: No Infarction Wound Measurements Length: (cm) Width: (cm) Depth: (cm) Area: (cm) Volume: (cm) 0 % Reduction in Area: 100% 0 % Reduction in Volume: 100% 0 Epithelialization: Large (67-100%) 0 Tunneling: No 0 Undermining: No Wound Description Classification: Full Thickness Without Exposed Support Structures Wound Margin: Indistinct, nonvisible Exudate Amount: None Present Foul Odor After Cleansing: No Slough/Fibrino No Wound Bed Granulation Amount: None Present (0%) Exposed Structure Necrotic Amount: None Present (0%) Fascia Exposed: No Fat Layer (Subcutaneous Tissue) Exposed: No Tendon Exposed: No Muscle Exposed: No Joint Exposed: No Bone Exposed: No Electronic Signature(s) Signed:  09/03/2019 5:06:40 PM By: Baruch Gouty RN, BSN Entered By: Baruch Gouty on 09/03/2019 13:26:24 -------------------------------------------------------------------------------- Wound Assessment Details Patient Name: Date of Service: Tomma Lightning, A NN M. 09/03/2019 12:45 PM Medical Record Number: [id number] Patient Account Number: [id number] Date of Birth/Sex: Treating RN: 11-23-1923 (86 y.o. Elam Dutch Primary Care Merlene Dante: MCDIA RMID, TO DD Other Clinician: Referring Negan Grudzien: Treating Atisha Hamidi/Extender: Linton Ham MCDIA RMID, TO DD Weeks in Treatment: 2 Wound Status Wound Number: 3 Primary Lymphedema Etiology: Wound Location: Left, Proximal, Anterior Lower Leg Wound Open Wounding Event: Gradually Appeared Status: Date Acquired: 05/30/2019 Comorbid Chronic Obstructive Pulmonary Disease (COPD), Arrhythmia, Weeks Of Treatment: 2 History: Congestive Heart Failure, Coronary Artery Disease, Myocardial Clustered Wound: No Infarction Wound Measurements Length: (cm) 0.3 Width: (cm) 0.2 Depth: (cm) 0.1 Area: (cm) 0.047 Volume: (cm) 0.005 % Reduction in Area: 96.1% %  Reduction in Volume: 95.9% Epithelialization: Medium (34-66%) Tunneling: No Undermining: No Wound Description Classification: Full Thickness Without Exposed Support Structures Wound Margin: Flat and Intact Exudate Amount: Small Exudate Type: Serosanguineous Exudate Color: red, brown Foul Odor After Cleansing: No Slough/Fibrino Yes Wound Bed Granulation Amount: Large (67-100%) Exposed Structure Granulation Quality: Red Fascia Exposed: No Necrotic Amount: None Present (0%) Fat Layer (Subcutaneous Tissue) Exposed: Yes Tendon Exposed: No Muscle Exposed: No Joint Exposed: No Bone Exposed: No Treatment Notes Wound #3 (Left, Proximal, Anterior Lower Leg) 1. Cleanse With Wound Cleanser Soap and water 2. Periwound Care Moisturizing lotion 3. Primary Dressing Applied Hydrofera Blue 6. Support  Layer Applied 4 layer compression wrap Notes netting Electronic Signature(s) Signed: 09/03/2019 5:06:40 PM By: Baruch Gouty RN, BSN Entered By: Baruch Gouty on 09/03/2019 13:26:49 -------------------------------------------------------------------------------- Wound Assessment Details Patient Name: Date of Service: Tomma Lightning, A NN M. 09/03/2019 12:45 PM Medical Record Number: [id number] Patient Account Number: [id number] Date of Birth/Sex: Treating RN: May 05, 1923 (86 y.o. Elam Dutch Primary Care Brynlynn Walko: MCDIA RMID, TO DD Other Clinician: Referring Daxson Reffett: Treating Seraphina Mitchner/Extender: Linton Ham MCDIA RMID, TO DD Weeks in Treatment: 2 Wound Status Wound Number: 4 Primary Lymphedema Etiology: Wound Location: Left, Lateral Lower Leg Wound Healed - Epithelialized Wounding Event: Gradually Appeared Status: Date Acquired: 05/30/2019 Comorbid Chronic Obstructive Pulmonary Disease (COPD), Arrhythmia, Weeks Of Treatment: 2 History: Congestive Heart Failure, Coronary Artery Disease, Myocardial Clustered Wound: No Infarction Wound Measurements Length: (cm) Width: (cm) Depth: (cm) Area: (cm) Volume: (cm) 0 % Reduction in Area: 100% 0 % Reduction in Volume: 100% 0 Epithelialization: Large (67-100%) 0 Tunneling: No 0 Undermining: No Wound Description Classification: Full Thickness Without Exposed Support Structures Wound Margin: Indistinct, nonvisible Exudate Amount: None Present Foul Odor After Cleansing: No Slough/Fibrino No Wound Bed Granulation Amount: None Present (0%) Exposed Structure Necrotic Amount: None Present (0%) Fascia Exposed: No Fat Layer (Subcutaneous Tissue) Exposed: No Tendon Exposed: No Muscle Exposed: No Joint Exposed: No Bone Exposed: No Electronic Signature(s) Signed: 09/03/2019 5:06:40 PM By: Baruch Gouty RN, BSN Entered By: Baruch Gouty on 09/03/2019  13:27:17 -------------------------------------------------------------------------------- Wound Assessment Details Patient Name: Date of Service: Tomma Lightning, A NN M. 09/03/2019 12:45 PM Medical Record Number: [id number] Patient Account Number: [id number] Date of Birth/Sex: Treating RN: [birth year] (86 y.o. Elam Dutch Primary Care Sana Tessmer: MCDIA RMID, TO DD Other Clinician: Referring Divonte Senger: Treating Aldrich Lloyd/Extender: Linton Ham MCDIA RMID, TO DD Weeks in Treatment: 2 Wound Status Wound Number: 5 Primary Skin T ear Etiology: Wound Location: Right, Distal, Anterior Lower Leg Wound Open Wounding Event: Trauma Status: Date Acquired: 08/13/2019 Comorbid Chronic Obstructive Pulmonary Disease (COPD), Arrhythmia, Weeks Of Treatment: 0 History: Congestive Heart Failure, Coronary Artery Disease, Myocardial Clustered Wound: No Infarction Wound Measurements Length: (cm) 0.5 Width: (cm) 0.5 Depth: (cm) 0.1 Area: (cm) 0.196 Volume: (cm) 0.02 % Reduction in Area: % Reduction in Volume: Epithelialization: Small (1-33%) Tunneling: No Undermining: No Wound Description Classification: Full Thickness Without Exposed Support Structures Wound Margin: Flat and Intact Exudate Amount: Small Exudate Type: Serosanguineous Exudate Color: red, brown Foul Odor After Cleansing: No Slough/Fibrino No Wound Bed Granulation Amount: Large (67-100%) Exposed Structure Granulation Quality: Red Fascia Exposed: No Necrotic Amount: None Present (0%) Fat Layer (Subcutaneous Tissue) Exposed: Yes Tendon Exposed: No Muscle Exposed: No Joint Exposed: No Bone Exposed: No Treatment Notes Wound #5 (Right, Distal, Anterior Lower Leg) 1. Cleanse With Wound Cleanser Soap and water 2. Periwound Care Moisturizing lotion 3. Primary Dressing Applied Hydrofera Blue 6. Support Layer Applied 4 layer  compression wrap Notes netting Electronic Signature(s) Signed: 09/03/2019 5:06:40 PM By: Baruch Gouty RN, BSN Entered By: Baruch Gouty on 09/03/2019 13:29:23 -------------------------------------------------------------------------------- Vitals Details Patient Name: Date of Service: Tomma Lightning, A NN M. 09/03/2019 12:45 PM Medical Record Number: [id number] Patient Account Number: [id number] Date of Birth/Sex: Treating RN: 01/25/1924 (86 y.o. Elam Dutch Primary Care Kameela Leipold: MCDIA RMID, TO DD Other Clinician: Referring Lacole Komorowski: Treating Jaasia Viglione/Extender: Linton Ham MCDIA RMID, TO DD Weeks in Treatment: 2 Vital Signs Time Taken: 13:06 Temperature (F): 97.6 Height (in): 65 Pulse (bpm): 96 Source: Stated Respiratory Rate (breaths/min): 20 Weight (lbs): 173 Blood Pressure (mmHg): 120/80 Source: Stated Reference Range: 80 - 120 mg / dl Body Mass Index (BMI): 28.8 Electronic Signature(s) Signed: 09/03/2019 5:06:40 PM By: Baruch Gouty RN, BSN Entered By: Baruch Gouty on 09/03/2019 13:06:29

## 2019-09-04 DIAGNOSIS — I5032 Chronic diastolic (congestive) heart failure: Secondary | ICD-10-CM | POA: Diagnosis not present

## 2019-09-04 DIAGNOSIS — W1849XD Other slipping, tripping and stumbling without falling, subsequent encounter: Secondary | ICD-10-CM | POA: Diagnosis not present

## 2019-09-04 DIAGNOSIS — S81802D Unspecified open wound, left lower leg, subsequent encounter: Secondary | ICD-10-CM | POA: Diagnosis not present

## 2019-09-04 DIAGNOSIS — I2510 Atherosclerotic heart disease of native coronary artery without angina pectoris: Secondary | ICD-10-CM | POA: Diagnosis not present

## 2019-09-04 DIAGNOSIS — I872 Venous insufficiency (chronic) (peripheral): Secondary | ICD-10-CM | POA: Diagnosis not present

## 2019-09-04 DIAGNOSIS — L97819 Non-pressure chronic ulcer of other part of right lower leg with unspecified severity: Secondary | ICD-10-CM | POA: Diagnosis not present

## 2019-09-06 DIAGNOSIS — L97819 Non-pressure chronic ulcer of other part of right lower leg with unspecified severity: Secondary | ICD-10-CM | POA: Diagnosis not present

## 2019-09-06 DIAGNOSIS — W1849XD Other slipping, tripping and stumbling without falling, subsequent encounter: Secondary | ICD-10-CM | POA: Diagnosis not present

## 2019-09-06 DIAGNOSIS — I5032 Chronic diastolic (congestive) heart failure: Secondary | ICD-10-CM | POA: Diagnosis not present

## 2019-09-06 DIAGNOSIS — S81802D Unspecified open wound, left lower leg, subsequent encounter: Secondary | ICD-10-CM | POA: Diagnosis not present

## 2019-09-06 DIAGNOSIS — I872 Venous insufficiency (chronic) (peripheral): Secondary | ICD-10-CM | POA: Diagnosis not present

## 2019-09-06 DIAGNOSIS — I2510 Atherosclerotic heart disease of native coronary artery without angina pectoris: Secondary | ICD-10-CM | POA: Diagnosis not present

## 2019-09-06 NOTE — Telephone Encounter (Signed)
LMOVM

## 2019-09-10 ENCOUNTER — Other Ambulatory Visit: Payer: Self-pay

## 2019-09-10 ENCOUNTER — Encounter (HOSPITAL_BASED_OUTPATIENT_CLINIC_OR_DEPARTMENT_OTHER): Payer: Medicare Other | Admitting: Internal Medicine

## 2019-09-10 DIAGNOSIS — L97819 Non-pressure chronic ulcer of other part of right lower leg with unspecified severity: Secondary | ICD-10-CM | POA: Diagnosis not present

## 2019-09-10 DIAGNOSIS — L97821 Non-pressure chronic ulcer of other part of left lower leg limited to breakdown of skin: Secondary | ICD-10-CM | POA: Diagnosis not present

## 2019-09-10 DIAGNOSIS — L97311 Non-pressure chronic ulcer of right ankle limited to breakdown of skin: Secondary | ICD-10-CM | POA: Diagnosis not present

## 2019-09-10 DIAGNOSIS — L97811 Non-pressure chronic ulcer of other part of right lower leg limited to breakdown of skin: Secondary | ICD-10-CM | POA: Diagnosis not present

## 2019-09-10 DIAGNOSIS — I872 Venous insufficiency (chronic) (peripheral): Secondary | ICD-10-CM | POA: Diagnosis not present

## 2019-09-10 DIAGNOSIS — I2510 Atherosclerotic heart disease of native coronary artery without angina pectoris: Secondary | ICD-10-CM | POA: Diagnosis not present

## 2019-09-10 DIAGNOSIS — I5032 Chronic diastolic (congestive) heart failure: Secondary | ICD-10-CM | POA: Diagnosis not present

## 2019-09-10 DIAGNOSIS — N184 Chronic kidney disease, stage 4 (severe): Secondary | ICD-10-CM | POA: Diagnosis not present

## 2019-09-10 DIAGNOSIS — W1849XD Other slipping, tripping and stumbling without falling, subsequent encounter: Secondary | ICD-10-CM | POA: Diagnosis not present

## 2019-09-10 DIAGNOSIS — I509 Heart failure, unspecified: Secondary | ICD-10-CM | POA: Diagnosis not present

## 2019-09-10 DIAGNOSIS — S81802D Unspecified open wound, left lower leg, subsequent encounter: Secondary | ICD-10-CM | POA: Diagnosis not present

## 2019-09-10 NOTE — Progress Notes (Addendum)
ERTLE, [name] ([id number]) Visit Report for 09/10/2019 HPI Details Patient Name: Date of Service: [name], A NN M. 09/10/2019 2:30 PM Medical Record Number: [id number] Patient Account Number: [id number] Date of Birth/Sex: Treating RN: 09/09/1923 (86 y.o. Orvan Falconer Primary Care Provider: MCDIA RMID, TO DD Other Clinician: Referring Provider: Treating Provider/Extender: Linton Ham MCDIA RMID, TO DD Weeks in Treatment: 3 History of Present Illness HPI Description: ADMISSION 08/19/2019 This is a 86 year old woman who is here for bilateral lower leg wounds. Looking through her history she saw vascular surgery at [facility] in August 2020 diagnosed with lymphedema. She is here with 2 wounds on each of her lower legs. She does have a history of using compression stockings nevertheless these wounds opened up about 2 months ago without obvious cause. She saw her primary physician on 07/26/2019 noticed the wounds. Wondered whether she might be in heart failure and tried to arrange consult with Dr. [name] of cardiology. She had a DVT rule out on 6/10 that was negative. Previously had reflux studies done in 2019. She has 4 wounds 2 on the right which includes the right anterior lower extremity and the right medial malleolus and 2 on the left which includes the left anterior lower extremity and the most extensive wound is on the left lateral lower extremity. She does not describe claudication pain. Past medical history includes A. fib on Eliquis, congestive heart failure, iron deficiency anemia related to chronic blood loss, chronic kidney disease stage IV, bilateral lower extremity lymphedema, macular degeneration, COPD. She is a minimal ambulator. Last ejection fraction I thought was an EF of 50 to 55% ABI on the right at 0.93 on the left at 0.85 09/03/2019; most of the patient's wounds are healed including the right ankle and left lateral. She has a new area which was caused by a problem removing  the wrap by home health on the right anterior distal lower extremity. She still has a small area on the left anterior. We are using Hydrofera Blue under compression. She did not bring her stockings today 7/13; although the patient's wounds are closed. Her edema is well controlled. She did not bring her stockings in today. I am not even sure what she has Electronic Signature(s) Signed: 09/10/2019 5:16:39 PM By: Linton Ham MD Entered By: Linton Ham on 09/10/2019 16:20:11 -------------------------------------------------------------------------------- Physical Exam Details Patient Name: Date of Service: [name], A NN M. 09/10/2019 2:30 PM Medical Record Number: [id number] Patient Account Number: [id number] Date of Birth/Sex: Treating RN: 04-09-1923 (86 y.o. Orvan Falconer Primary Care Provider: MCDIA RMID, TO DD Other Clinician: Referring Provider: Treating Provider/Extender: Linton Ham MCDIA RMID, TO DD Weeks in Treatment: 3 Constitutional Sitting or standing Blood Pressure is within target range for patient.. Pulse regular and within target range for patient.Marland Kitchen Respirations regular, non-labored and within target range.. Temperature is normal and within the target range for the patient.Marland Kitchen Appears in no distress. Cardiovascular Pedal pulses palpable and strong bilaterally.. Integumentary (Hair, Skin) Hemosiderin deposition bilaterally. Notes Wound exam All of the patient's wounds are closed. She has chronic venous insufficiency with secondary lymphedema. Very fragile skin in her lower extremities. Electronic Signature(s) Signed: 09/10/2019 5:16:39 PM By: Linton Ham MD Entered By: Linton Ham on 09/10/2019 16:21:03 -------------------------------------------------------------------------------- Physician Orders Details Patient Name: Date of Service: [name], A NN M. 09/10/2019 2:30 PM Medical Record Number: [id number] Patient Account Number: [id number] Date of  Birth/Sex: Treating RN: 1924-02-11 (86 y.o. 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 patient to apply lotion every evening. patient to elevate legs while sitting . Electronic Signature(s) Signed: 09/10/2019 5:16:39 PM By: Linton Ham MD Signed: 09/10/2019 5:57:23 PM By: Carlene Coria RN Entered By: Carlene Coria on 09/10/2019 16:15:29 -------------------------------------------------------------------------------- Problem List Details Patient Name: Date of Service: [name], A NN M. 09/10/2019 2:30 PM Medical Record Number: [id number] Patient Account Number: [id number] Date of Birth/Sex: Treating RN: 05-06-1923 (86 y.o. Orvan Falconer Primary Care Provider: MCDIA RMID, TO DD Other Clinician: Referring Provider: Treating Provider/Extender: Linton Ham MCDIA RMID, TO DD Weeks in Treatment: 3 Active Problems ICD-10 Encounter Code Description Active Date MDM Diagnosis I89.0 Lymphedema, not elsewhere classified 08/19/2019 No Yes L97.811 Non-pressure chronic ulcer of other part of right lower leg limited to breakdown 08/19/2019 No Yes of skin L97.821 Non-pressure chronic ulcer of other part of left lower leg limited to breakdown 08/19/2019 No Yes of skin Inactive Problems Resolved Problems ICD-10 Code Description Active Date Resolved Date L97.311 Non-pressure chronic ulcer of right ankle limited to  breakdown of skin 08/19/2019 08/19/2019 Electronic Signature(s) Signed: 09/10/2019 5:16:39 PM By: Linton Ham MD Entered By: Linton Ham on 09/10/2019 16:19:30 -------------------------------------------------------------------------------- Progress Note Details Patient Name: Date of Service: [name], A NN M. 09/10/2019 2:30 PM Medical Record Number: [id number] Patient Account Number: [id number] Date of Birth/Sex: Treating RN: [birth year] (86 y.o. Orvan Falconer Primary Care Provider: MCDIA RMID, TO DD Other Clinician: Referring Provider: Treating Provider/Extender: Linton Ham MCDIA RMID, TO DD Weeks in Treatment: 3 Subjective History of Present Illness (HPI) ADMISSION 08/19/2019 This is a 86 year old woman who is here for bilateral lower leg wounds. Looking through her history she saw vascular surgery at [facility] in August 2020 diagnosed with lymphedema. She is here with 2 wounds on each of her lower legs. She does have a history of using compression stockings nevertheless these wounds opened up about 2 months ago without obvious cause. She saw her primary physician on 07/26/2019 noticed the wounds. Wondered whether she might be in heart failure and tried to arrange consult with Dr. [name] of cardiology. She had a DVT rule out on 6/10 that was negative. Previously had reflux studies done in 2019. She has 4 wounds 2 on the right which includes the right anterior lower extremity and the right medial malleolus and 2 on the left which includes the left anterior lower extremity and the most extensive wound is on the left lateral lower extremity. She does not describe claudication pain. Past medical history includes A. fib on Eliquis, congestive heart failure, iron deficiency anemia related to chronic blood loss, chronic kidney disease stage IV, bilateral lower extremity lymphedema, macular degeneration, COPD. She is a minimal ambulator. Last ejection fraction I thought was an EF of 50 to  55% ABI on the right at 0.93 on the left at 0.85 09/03/2019; most of the patient's wounds are healed including the right ankle and left lateral. She has a new area which was caused by a problem removing  the wrap by home health on the right anterior distal lower extremity. She still has a small area on the left anterior. We are using Hydrofera Blue under compression. She did not bring her stockings today 7/13; although the patient's wounds are closed. Her edema is well controlled. She did not bring her stockings in today. I am not even sure what she has Objective Constitutional Sitting or standing Blood Pressure is within target range for patient.. Pulse regular and within target range for patient.Marland Kitchen Respirations regular, non-labored and within target range.. Temperature is normal and within the target range for the patient.Marland Kitchen Appears in no distress. Vitals Time Taken: 4:03 PM, Height: 65 in, Weight: 173 lbs, BMI: 28.8, Temperature: 98.2 F, Pulse: 90 bpm, Respiratory Rate: 18 breaths/min, Blood Pressure: 130/72 mmHg. Cardiovascular Pedal pulses palpable and strong bilaterally.. General Notes: Wound exam ooAll of the patient's wounds are closed. She has chronic venous insufficiency with secondary lymphedema. Very fragile skin in her lower extremities. Integumentary (Hair, Skin) Hemosiderin deposition bilaterally. Wound #3 status is Healed - Epithelialized. Original cause of wound was Gradually Appeared. The wound is located on the Left,Proximal,Anterior Lower Leg. The wound measures 0cm length x 0cm width x 0cm depth; 0cm^2 area and 0cm^3 volume. There is no tunneling or undermining noted. There is a none present amount of drainage noted. The wound margin is flat and intact. There is no granulation within the wound bed. There is no necrotic tissue within the wound bed. Wound #5 status is Healed - Epithelialized. Original cause of wound was Trauma. The wound is located on the Right,Distal,Anterior  Lower Leg. The wound measures 0cm length x 0cm width x 0cm depth; 0cm^2 area and 0cm^3 volume. There is no tunneling or undermining noted. There is a none present amount of drainage noted. The wound margin is flat and intact. There is no granulation within the wound bed. There is no necrotic tissue within the wound bed. 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 patient to apply lotion every evening. patient to elevate legs while sitting . #1 the patient is discharged from the wound care center. Patient to wear compression stockings daily lubricate skin at night 2. We were not successful to get her to bring in her stockings that she said she was going to wear. I fear if she does not wear these she will be back to the clinic soon Electronic Signature(s) Signed: 09/10/2019 5:16:39 PM By: Linton Ham MD Entered By: Linton Ham on 09/10/2019 16:21:53 -------------------------------------------------------------------------------- SuperBill Details Patient Name: Date of Service: [name], A NN M. 09/10/2019 Medical Record Number: [id number] Patient Account Number: [id number] Date of Birth/Sex: Treating RN: 1924-02-12 (86 y.o. Orvan Falconer Primary Care Provider: MCDIA RMID, TO DD Other Clinician: Referring Provider: Treating Provider/Extender: Linton Ham MCDIA RMID, TO DD Weeks in Treatment: 3 Diagnosis Coding ICD-10 Codes Code Description I89.0 Lymphedema, not elsewhere classified L97.811 Non-pressure chronic ulcer of other part of right lower leg limited to breakdown of skin L97.821 Non-pressure chronic ulcer of other part of  left lower leg limited to breakdown of skin Facility Procedures CPT4 Code: [id number] Description: 33825 - WOUND CARE VISIT-LEV 2 EST PT Modifier: Quantity: 1 Physician  Procedures : CPT4 Code Description Modifier [id number] - WC PHYS LEVEL 2 - EST PT ICD-10 Diagnosis Description I89.0 Lymphedema, not elsewhere classified L97.811 Non-pressure chronic ulcer of other part of right lower leg limited to breakdown of skin L97.821  Non-pressure chronic ulcer of other part of left lower leg limited to breakdown of skin Quantity: 1 Electronic Signature(s) Signed: 09/10/2019 5:16:39 PM By: Linton Ham MD Entered By: Linton Ham on 09/10/2019 16:22:12

## 2019-09-11 DIAGNOSIS — L97819 Non-pressure chronic ulcer of other part of right lower leg with unspecified severity: Secondary | ICD-10-CM | POA: Diagnosis not present

## 2019-09-11 DIAGNOSIS — I2510 Atherosclerotic heart disease of native coronary artery without angina pectoris: Secondary | ICD-10-CM | POA: Diagnosis not present

## 2019-09-11 DIAGNOSIS — W1849XD Other slipping, tripping and stumbling without falling, subsequent encounter: Secondary | ICD-10-CM | POA: Diagnosis not present

## 2019-09-11 DIAGNOSIS — I5032 Chronic diastolic (congestive) heart failure: Secondary | ICD-10-CM | POA: Diagnosis not present

## 2019-09-11 DIAGNOSIS — S81802D Unspecified open wound, left lower leg, subsequent encounter: Secondary | ICD-10-CM | POA: Diagnosis not present

## 2019-09-11 DIAGNOSIS — I872 Venous insufficiency (chronic) (peripheral): Secondary | ICD-10-CM | POA: Diagnosis not present

## 2019-09-12 ENCOUNTER — Telehealth: Payer: Self-pay

## 2019-09-12 DIAGNOSIS — I5032 Chronic diastolic (congestive) heart failure: Secondary | ICD-10-CM | POA: Diagnosis not present

## 2019-09-12 DIAGNOSIS — W1849XD Other slipping, tripping and stumbling without falling, subsequent encounter: Secondary | ICD-10-CM | POA: Diagnosis not present

## 2019-09-12 DIAGNOSIS — I2510 Atherosclerotic heart disease of native coronary artery without angina pectoris: Secondary | ICD-10-CM | POA: Diagnosis not present

## 2019-09-12 DIAGNOSIS — S81802D Unspecified open wound, left lower leg, subsequent encounter: Secondary | ICD-10-CM | POA: Diagnosis not present

## 2019-09-12 DIAGNOSIS — I872 Venous insufficiency (chronic) (peripheral): Secondary | ICD-10-CM | POA: Diagnosis not present

## 2019-09-12 DIAGNOSIS — L97819 Non-pressure chronic ulcer of other part of right lower leg with unspecified severity: Secondary | ICD-10-CM | POA: Diagnosis not present

## 2019-09-12 NOTE — Telephone Encounter (Signed)
Received VM from Carlota Raspberry, RN at [facility] regarding patient's leg swelling and weight gain. RN reports that the wound center discontinued compression bandages and patient is now having some BLE edema. RN also reports 5 pound weight gain since last Friday 7/9 and mild wheezing in upper lobes. RN states that patient took furosemide pill this morning and plans to revisit patient tomorrow to see if swelling and weight gain has gone down.   Called and spoke with patient's daughter. Daughter reports that Lasix has helped. Reports increased use of breathing treatments but no respiratory distress.   Strict return/ ED precautions given.  Betti Cruz Almond [place]

## 2019-09-12 NOTE — Telephone Encounter (Signed)
Spoke with pt's daughter, [name] ([place]). She states that she forgot to ask pt about the monitor, but agrees to do so in the next few days. She requests call back next week. [name] denies questions at this time.

## 2019-09-13 DIAGNOSIS — I872 Venous insufficiency (chronic) (peripheral): Secondary | ICD-10-CM | POA: Diagnosis not present

## 2019-09-13 DIAGNOSIS — I5032 Chronic diastolic (congestive) heart failure: Secondary | ICD-10-CM | POA: Diagnosis not present

## 2019-09-13 DIAGNOSIS — W1849XD Other slipping, tripping and stumbling without falling, subsequent encounter: Secondary | ICD-10-CM | POA: Diagnosis not present

## 2019-09-13 DIAGNOSIS — S81802D Unspecified open wound, left lower leg, subsequent encounter: Secondary | ICD-10-CM | POA: Diagnosis not present

## 2019-09-13 DIAGNOSIS — L97819 Non-pressure chronic ulcer of other part of right lower leg with unspecified severity: Secondary | ICD-10-CM | POA: Diagnosis not present

## 2019-09-13 DIAGNOSIS — I2510 Atherosclerotic heart disease of native coronary artery without angina pectoris: Secondary | ICD-10-CM | POA: Diagnosis not present

## 2019-09-16 DIAGNOSIS — S81802D Unspecified open wound, left lower leg, subsequent encounter: Secondary | ICD-10-CM | POA: Diagnosis not present

## 2019-09-16 DIAGNOSIS — L97819 Non-pressure chronic ulcer of other part of right lower leg with unspecified severity: Secondary | ICD-10-CM | POA: Diagnosis not present

## 2019-09-16 DIAGNOSIS — I2510 Atherosclerotic heart disease of native coronary artery without angina pectoris: Secondary | ICD-10-CM | POA: Diagnosis not present

## 2019-09-16 DIAGNOSIS — W1849XD Other slipping, tripping and stumbling without falling, subsequent encounter: Secondary | ICD-10-CM | POA: Diagnosis not present

## 2019-09-16 DIAGNOSIS — I5032 Chronic diastolic (congestive) heart failure: Secondary | ICD-10-CM | POA: Diagnosis not present

## 2019-09-16 DIAGNOSIS — I872 Venous insufficiency (chronic) (peripheral): Secondary | ICD-10-CM | POA: Diagnosis not present

## 2019-09-16 NOTE — Progress Notes (Signed)
HELMERS, [name] ([id number]) Visit Report for 09/10/2019 Arrival Information Details Patient Name: Date of Service: [name], A NN M. 09/10/2019 2:30 PM Medical Record Number: [id number] Patient Account Number: [id number] Date of Birth/Sex: Treating RN: 01-Jun-1923 (86 y.o. [name] Primary Care Bristol Osentoski: MCDIA RMID, TO DD Other Clinician: Referring [name]: Treating [name]/Extender: [name] MCDIA RMID, TO DD Weeks in Treatment: 3 Visit Information History Since Last Visit Added or deleted any medications: No Patient Arrived: Walker Any new allergies or adverse reactions: No Arrival Time: 16:03 Had a fall or experienced change in No Accompanied By: alone activities of daily living that may affect Transfer Assistance: None risk of falls: Patient Identification Verified: Yes Signs or symptoms of abuse/neglect since last visito No Secondary Verification Process Completed: Yes Hospitalized since last visit: No Patient Requires Transmission-Based Precautions: No Implantable device outside of the clinic excluding No Patient Has Alerts: No cellular tissue based products placed in the center since last visit: Has Dressing in Place as Prescribed: Yes Has Compression in Place as Prescribed: Yes Pain Present Now: No Electronic Signature(s) Signed: 09/16/2019 4:12:59 PM By: Tammy Hurst RN, BSN Entered By: [name] on 09/10/2019 16:03:40 -------------------------------------------------------------------------------- Clinic Level of Care Assessment Details Patient Name: Date of Service: V [name], A NN M. 09/10/2019 2:30 PM Medical Record Number: [id number] Patient Account Number: [id number] Date of Birth/Sex: Treating RN: 1923-06-16 (86 y.o. [name] Primary Care Eldridge Marcott: MCDIA RMID, TO DD Other Clinician: Referring [name]: Treating [name]/Extender: [name] MCDIA RMID, TO DD Weeks in Treatment: 3 Clinic Level of Care Assessment Items TOOL 4  Quantity Score X- 1 0 Use when only an EandM is performed on FOLLOW-UP visit ASSESSMENTS - Nursing Assessment / Reassessment X- 1 10 Reassessment of Co-morbidities (includes updates in patient status) X- 1 5 Reassessment of Adherence to Treatment Plan ASSESSMENTS - Wound and Skin A ssessment / Reassessment X - Simple Wound Assessment / Reassessment - one wound 1 5 []  - 0 Complex Wound Assessment / Reassessment - multiple wounds []  - 0 Dermatologic / Skin Assessment (not related to wound area) ASSESSMENTS - Focused Assessment []  - 0 Circumferential Edema Measurements - multi extremities []  - 0 Nutritional Assessment / Counseling / Intervention []  - 0 Lower Extremity Assessment (monofilament, tuning fork, pulses) []  - 0 Peripheral Arterial Disease Assessment (using hand held doppler) ASSESSMENTS - Ostomy and/or Continence Assessment and Care []  - 0 Incontinence Assessment and Management []  - 0 Ostomy Care Assessment and Management (repouching, etc.) PROCESS - Coordination of Care X - Simple Patient / Family Education for ongoing care 1 15 []  - 0 Complex (extensive) Patient / Family Education for ongoing care X- 1 10 Staff obtains Programmer, systems, Records, T Results / Process Orders est []  - 0 Staff telephones HHA, Nursing Homes / Clarify orders / etc []  - 0 Routine Transfer to another Facility (non-emergent condition) []  - 0 Routine Hospital Admission (non-emergent condition) []  - 0 New Admissions / Biomedical engineer / Ordering NPWT Apligraf, etc. , []  - 0 Emergency Hospital Admission (emergent condition) X- 1 10 Simple Discharge Coordination []  - 0 Complex (extensive) Discharge Coordination PROCESS - Special Needs []  - 0 Pediatric / Minor Patient Management []  - 0 Isolation Patient Management []  - 0 Hearing / Language / Visual special needs []  - 0 Assessment of Community assistance (transportation, D/C planning, etc.) []  - 0 Additional assistance / Altered  mentation []  - 0 Support Surface(s) Assessment (bed, cushion, seat, etc.) INTERVENTIONS - Wound Cleansing / Measurement  X - Simple Wound Cleansing - one wound 1 5 []  - 0 Complex Wound Cleansing - multiple wounds X- 1 5 Wound Imaging (photographs - any number of wounds) []  - 0 Wound Tracing (instead of photographs) X- 1 5 Simple Wound Measurement - one wound []  - 0 Complex Wound Measurement - multiple wounds INTERVENTIONS - Wound Dressings []  - 0 Small Wound Dressing one or multiple wounds []  - 0 Medium Wound Dressing one or multiple wounds []  - 0 Large Wound Dressing one or multiple wounds []  - 0 Application of Medications - topical []  - 0 Application of Medications - injection INTERVENTIONS - Miscellaneous []  - 0 External ear exam []  - 0 Specimen Collection (cultures, biopsies, blood, body fluids, etc.) []  - 0 Specimen(s) / Culture(s) sent or taken to Lab for analysis []  - 0 Patient Transfer (multiple staff / Civil Service fast streamer / Similar devices) []  - 0 Simple Staple / Suture removal (25 or less) []  - 0 Complex Staple / Suture removal (26 or more) []  - 0 Hypo / Hyperglycemic Management (close monitor of Blood Glucose) []  - 0 Ankle / Brachial Index (ABI) - do not check if billed separately X- 1 5 Vital Signs Has the patient been seen at the hospital within the last three years: Yes Total Score: 75 Level Of Care: New/Established - Level 2 Electronic Signature(s) Signed: 09/10/2019 5:57:23 PM By: Tammy Coria RN Entered By: [name] on 09/10/2019 16:16:35 -------------------------------------------------------------------------------- Encounter Discharge Information Details Patient Name: Date of Service: [name], A NN M. 09/10/2019 2:30 PM Medical Record Number: [id number] Patient Account Number: [id number] Date of Birth/Sex: Treating RN: [birth year] (86 y.o. [name] Primary Care Dj Senteno: MCDIA RMID, TO DD Other Clinician: Referring [name]: Treating  [name]/Extender: [name] MCDIA RMID, TO DD Weeks in Treatment: 3 Encounter Discharge Information Items Discharge Condition: Stable Ambulatory Status: Walker Discharge Destination: Home Transportation: Private Auto Accompanied By: self Schedule Follow-up Appointment: Yes Clinical Summary of Care: Patient Declined Electronic Signature(s) Signed: 09/10/2019 5:18:51 PM By: Kela Millin Entered By: Kela Millin on 09/10/2019 16:48:26 -------------------------------------------------------------------------------- Lower Extremity Assessment Details Patient Name: Date of Service: [name], A NN M. 09/10/2019 2:30 PM Medical Record Number: [id number] Patient Account Number: [id number] Date of Birth/Sex: Treating RN: 09-14-1923 (86 y.o. [name] Primary Care Jymir Dunaj: MCDIA RMID, TO DD Other Clinician: Referring Mindie Rawdon: Treating Trenae Brunke/Extender: [name] MCDIA RMID, TO DD Weeks in Treatment: 3 Edema Assessment Assessed: [Left: No] [Right: No] Edema: [Left: Yes] [Right: Yes] Calf Left: Right: Point of Measurement: 37 cm From Medial Instep 32.5 cm 31.3 cm Ankle Left: Right: Point of Measurement: 10 cm From Medial Instep 21.5 cm 21.5 cm Vascular Assessment Pulses: Dorsalis Pedis Palpable: [Left:Yes] [Right:Yes] Electronic Signature(s) Signed: 09/16/2019 4:12:59 PM By: Tammy Hurst RN, BSN Entered By: [name] on 09/10/2019 16:04:08 -------------------------------------------------------------------------------- Multi Wound Chart Details Patient Name: Date of Service: [name], A NN M. 09/10/2019 2:30 PM Medical Record Number: [id number] Patient Account Number: [id number] Date of Birth/Sex: Treating RN: October 29, 1923 (86 y.o. [name] Primary Care Capers Hagmann: MCDIA RMID, TO DD Other Clinician: Referring Jakhia Buxton: Treating Azara Gemme/Extender: [name] MCDIA RMID, TO DD Weeks in Treatment: 3 Vital Signs Height(in):  9 Pulse(bpm): 89 Weight(lbs): 173 Blood Pressure(mmHg): 130/72 Body Mass Index(BMI): 29 Temperature(F): 98.2 Respiratory Rate(breaths/min): 18 Photos: [3:No Photos Left, Proximal, Anterior Lower Leg] [5:No Photos Right, Distal, Anterior Lower Leg] [N/A:N/A N/A] Wound Location: [3:Gradually Appeared] [5:Trauma] [N/A:N/A] Wounding Event: [3:Lymphedema] [5:Skin T ear] [N/A:N/A] Primary Etiology: [3:Chronic Obstructive Pulmonary] [  5:Chronic Obstructive Pulmonary] [N/A:N/A] Comorbid History: [3:Disease (COPD), Arrhythmia, Congestive Heart Failure, Coronary Artery Disease, Myocardial Infarction 05/30/2019] [5:Disease (COPD), Arrhythmia, Congestive Heart Failure, Coronary Artery Disease, Myocardial Infarction 08/13/2019]  [N/A:N/A] Date Acquired: [3:3] [5:1] [N/A:N/A] Weeks of Treatment: [3:Healed - Epithelialized] [5:Healed - Epithelialized] [N/A:N/A] Wound Status: [3:0x0x0] [5:0x0x0] [N/A:N/A] Measurements L x W x D (cm) [3:0] [5:0] [N/A:N/A] A (cm) : rea [3:0] [5:0] [N/A:N/A] Volume (cm) : [3:100.00%] [5:100.00%] [N/A:N/A] % Reduction in Area: [3:100.00%] [5:100.00%] [N/A:N/A] % Reduction in Volume: [3:Full Thickness Without Exposed] [5:Full Thickness Without Exposed] [N/A:N/A] Classification: [3:Support Structures None Present] [5:Support Structures None Present] [N/A:N/A] Exudate Amount: [3:Flat and Intact] [5:Flat and Intact] [N/A:N/A] Wound Margin: [3:None Present (0%)] [5:None Present (0%)] [N/A:N/A] Granulation Amount: [3:None Present (0%)] [5:None Present (0%)] [N/A:N/A] Necrotic Amount: [3:Fascia: No] [5:Fascia: No] [N/A:N/A] Exposed Structures: [3:Fat Layer (Subcutaneous Tissue) Exposed: No Tendon: No Muscle: No Joint: No Bone: No Large (67-100%)] [5:Fat Layer (Subcutaneous Tissue) Exposed: No Tendon: No Muscle: No Joint: No Bone: No Large (67-100%)] [N/A:N/A] Treatment Notes Electronic Signature(s) Signed: 09/10/2019 5:16:39 PM By: Tammy Ham MD Signed: 09/10/2019 5:57:23  PM By: Tammy Coria RN Entered By: [name] on 09/10/2019 16:19:40 -------------------------------------------------------------------------------- [place] Details Patient Name: Date of Service: [name], A NN M. 09/10/2019 2:30 PM Medical Record Number: [id number] Patient Account Number: [id number] Date of Birth/Sex: Treating RN: [birth year] (86 y.o. [name] Primary Care Jaydenn Boccio: MCDIA RMID, TO DD Other Clinician: Referring Janal Haak: Treating Bingham Millette/Extender: [name] MCDIA RMID, TO DD Weeks in Treatment: 3 Active Inactive Electronic Signature(s) Signed: 09/10/2019 5:57:23 PM By: Tammy Coria RN Entered By: [name] on 09/10/2019 16:15:58 -------------------------------------------------------------------------------- Pain Assessment Details Patient Name: Date of Service: [name], A NN M. 09/10/2019 2:30 PM Medical Record Number: [id number] Patient Account Number: [id number] Date of Birth/Sex: Treating RN: 1923-05-07 (86 y.o. [name] Primary Care Ezzard Ditmer: MCDIA RMID, TO DD Other Clinician: Referring Datha Kissinger: Treating Asianae Minkler/Extender: [name] MCDIA RMID, TO DD Weeks in Treatment: 3 Active Problems Location of Pain Severity and Description of Pain Patient Has Paino No Site Locations Pain Management and Medication Current Pain Management: Electronic Signature(s) Signed: 09/16/2019 4:12:59 PM By: Tammy Hurst RN, BSN Entered By: [name] on 09/10/2019 16:04:03 -------------------------------------------------------------------------------- Patient/Caregiver Education Details Patient Name: Date of Service: [name], A NN M. 7/13/2021andnbsp2:30 PM Medical Record Number: [id number] Patient Account Number: [id number] Date of Birth/Gender: Treating RN: September 21, 1923 (86 y.o. [name] Primary Care Physician: MCDIA RMID, TO DD Other Clinician: Referring Physician: Treating Physician/Extender:  [name] MCDIA RMID, TO DD Weeks in Treatment: 3 Education Assessment Education Provided To: Patient Education Topics Provided Wound/Skin Impairment: Methods: Explain/Verbal Responses: State content correctly Electronic Signature(s) Signed: 09/10/2019 5:57:23 PM By: Tammy Coria RN Entered By: [name] on 09/10/2019 16:02:42 -------------------------------------------------------------------------------- Wound Assessment Details Patient Name: Date of Service: [name], A NN M. 09/10/2019 2:30 PM Medical Record Number: [id number] Patient Account Number: [id number] Date of Birth/Sex: Treating RN: [birth year] (86 y.o. [name] Primary Care Timonthy Hovater: MCDIA RMID, TO DD Other Clinician: Referring Cherryl Babin: Treating Nissi Doffing/Extender: [name] MCDIA RMID, TO DD Weeks in Treatment: 3 Wound Status Wound Number: 3 Primary Lymphedema Etiology: Wound Location: Left, Proximal, Anterior Lower Leg Wound Healed - Epithelialized Wounding Event: Gradually Appeared Status: Date Acquired: 05/30/2019 Comorbid Chronic Obstructive Pulmonary Disease (COPD), Arrhythmia, Weeks Of Treatment: 3 History: Congestive Heart Failure, Coronary Artery Disease, Myocardial Clustered Wound: No Infarction Wound Measurements Length: (cm) Width: (cm) Depth: (cm) Area: (cm) Volume: (cm) 0 %  Reduction in Area: 100% 0 % Reduction in Volume: 100% 0 Epithelialization: Large (67-100%) 0 Tunneling: No 0 Undermining: No Wound Description Classification: Full Thickness Without Exposed Support Structures Wound Margin: Flat and Intact Exudate Amount: None Present Foul Odor After Cleansing: No Slough/Fibrino No Wound Bed Granulation Amount: None Present (0%) Exposed Structure Necrotic Amount: None Present (0%) Fascia Exposed: No Fat Layer (Subcutaneous Tissue) Exposed: No Tendon Exposed: No Muscle Exposed: No Joint Exposed: No Bone Exposed: No Electronic Signature(s) Signed:  09/16/2019 4:12:59 PM By: Tammy Hurst RN, BSN Entered By: [name] on 09/10/2019 16:04:27 -------------------------------------------------------------------------------- Wound Assessment Details Patient Name: Date of Service: [name], A NN M. 09/10/2019 2:30 PM Medical Record Number: [id number] Patient Account Number: [id number] Date of Birth/Sex: Treating RN: May 09, 1923 (86 y.o. [name] Primary Care Karris Deangelo: MCDIA RMID, TO DD Other Clinician: Referring Charne Mcbrien: Treating Jaelon Gatley/Extender: [name] MCDIA RMID, TO DD Weeks in Treatment: 3 Wound Status Wound Number: 5 Primary Skin T ear Etiology: Wound Location: Right, Distal, Anterior Lower Leg Wound Healed - Epithelialized Wounding Event: Trauma Status: Date Acquired: 08/13/2019 Comorbid Chronic Obstructive Pulmonary Disease (COPD), Arrhythmia, Weeks Of Treatment: 1 History: Congestive Heart Failure, Coronary Artery Disease, Myocardial Clustered Wound: No Infarction Wound Measurements Length: (cm) Width: (cm) Depth: (cm) Area: (cm) Volume: (cm) 0 % Reduction in Area: 100% 0 % Reduction in Volume: 100% 0 Epithelialization: Large (67-100%) 0 Tunneling: No 0 Undermining: No Wound Description Classification: Full Thickness Without Exposed Support Structures Wound Margin: Flat and Intact Exudate Amount: None Present Foul Odor After Cleansing: No Slough/Fibrino No Wound Bed Granulation Amount: None Present (0%) Exposed Structure Necrotic Amount: None Present (0%) Fascia Exposed: No Fat Layer (Subcutaneous Tissue) Exposed: No Tendon Exposed: No Muscle Exposed: No Joint Exposed: No Bone Exposed: No Electronic Signature(s) Signed: 09/16/2019 4:12:59 PM By: Tammy Hurst RN, BSN Entered By: [name] on 09/10/2019 16:04:40 -------------------------------------------------------------------------------- Vitals Details Patient Name: Date of Service: [name], A NN M. 09/10/2019 2:30  PM Medical Record Number: [id number] Patient Account Number: [id number] Date of Birth/Sex: Treating RN: [birth year] (86 y.o. [name] Primary Care Kelci Petrella: MCDIA RMID, TO DD Other Clinician: Referring Darthula Desa: Treating Spring San/Extender: [name] MCDIA RMID, TO DD Weeks in Treatment: 3 Vital Signs Time Taken: 16:03 Temperature (F): 98.2 Height (in): 65 Pulse (bpm): 90 Weight (lbs): 173 Respiratory Rate (breaths/min): 18 Body Mass Index (BMI): 28.8 Blood Pressure (mmHg): 130/72 Reference Range: 80 - 120 mg / dl Electronic Signature(s) Signed: 09/16/2019 4:12:59 PM By: Tammy Hurst RN, BSN Entered By: [name] on 09/10/2019 16:03:58

## 2019-09-17 DIAGNOSIS — W1849XD Other slipping, tripping and stumbling without falling, subsequent encounter: Secondary | ICD-10-CM | POA: Diagnosis not present

## 2019-09-17 DIAGNOSIS — L97819 Non-pressure chronic ulcer of other part of right lower leg with unspecified severity: Secondary | ICD-10-CM | POA: Diagnosis not present

## 2019-09-17 DIAGNOSIS — I5032 Chronic diastolic (congestive) heart failure: Secondary | ICD-10-CM | POA: Diagnosis not present

## 2019-09-17 DIAGNOSIS — S81802D Unspecified open wound, left lower leg, subsequent encounter: Secondary | ICD-10-CM | POA: Diagnosis not present

## 2019-09-17 DIAGNOSIS — I2510 Atherosclerotic heart disease of native coronary artery without angina pectoris: Secondary | ICD-10-CM | POA: Diagnosis not present

## 2019-09-17 DIAGNOSIS — I872 Venous insufficiency (chronic) (peripheral): Secondary | ICD-10-CM | POA: Diagnosis not present

## 2019-09-18 ENCOUNTER — Other Ambulatory Visit: Payer: Self-pay | Admitting: Family Medicine

## 2019-09-18 DIAGNOSIS — L03116 Cellulitis of left lower limb: Secondary | ICD-10-CM

## 2019-09-19 DIAGNOSIS — W1849XD Other slipping, tripping and stumbling without falling, subsequent encounter: Secondary | ICD-10-CM | POA: Diagnosis not present

## 2019-09-19 DIAGNOSIS — I2510 Atherosclerotic heart disease of native coronary artery without angina pectoris: Secondary | ICD-10-CM | POA: Diagnosis not present

## 2019-09-19 DIAGNOSIS — I872 Venous insufficiency (chronic) (peripheral): Secondary | ICD-10-CM | POA: Diagnosis not present

## 2019-09-19 DIAGNOSIS — I5032 Chronic diastolic (congestive) heart failure: Secondary | ICD-10-CM | POA: Diagnosis not present

## 2019-09-19 DIAGNOSIS — S81802D Unspecified open wound, left lower leg, subsequent encounter: Secondary | ICD-10-CM | POA: Diagnosis not present

## 2019-09-19 DIAGNOSIS — L97819 Non-pressure chronic ulcer of other part of right lower leg with unspecified severity: Secondary | ICD-10-CM | POA: Diagnosis not present

## 2019-09-23 DIAGNOSIS — I872 Venous insufficiency (chronic) (peripheral): Secondary | ICD-10-CM | POA: Diagnosis not present

## 2019-09-23 DIAGNOSIS — W1849XD Other slipping, tripping and stumbling without falling, subsequent encounter: Secondary | ICD-10-CM | POA: Diagnosis not present

## 2019-09-23 DIAGNOSIS — I5032 Chronic diastolic (congestive) heart failure: Secondary | ICD-10-CM | POA: Diagnosis not present

## 2019-09-23 DIAGNOSIS — L97819 Non-pressure chronic ulcer of other part of right lower leg with unspecified severity: Secondary | ICD-10-CM | POA: Diagnosis not present

## 2019-09-23 DIAGNOSIS — I2510 Atherosclerotic heart disease of native coronary artery without angina pectoris: Secondary | ICD-10-CM | POA: Diagnosis not present

## 2019-09-23 DIAGNOSIS — S81802D Unspecified open wound, left lower leg, subsequent encounter: Secondary | ICD-10-CM | POA: Diagnosis not present

## 2019-09-24 DIAGNOSIS — I2510 Atherosclerotic heart disease of native coronary artery without angina pectoris: Secondary | ICD-10-CM | POA: Diagnosis not present

## 2019-09-24 DIAGNOSIS — S81802D Unspecified open wound, left lower leg, subsequent encounter: Secondary | ICD-10-CM | POA: Diagnosis not present

## 2019-09-24 DIAGNOSIS — I5032 Chronic diastolic (congestive) heart failure: Secondary | ICD-10-CM | POA: Diagnosis not present

## 2019-09-24 DIAGNOSIS — W1849XD Other slipping, tripping and stumbling without falling, subsequent encounter: Secondary | ICD-10-CM | POA: Diagnosis not present

## 2019-09-24 DIAGNOSIS — I872 Venous insufficiency (chronic) (peripheral): Secondary | ICD-10-CM | POA: Diagnosis not present

## 2019-09-24 DIAGNOSIS — L97819 Non-pressure chronic ulcer of other part of right lower leg with unspecified severity: Secondary | ICD-10-CM | POA: Diagnosis not present

## 2019-09-26 DIAGNOSIS — L97819 Non-pressure chronic ulcer of other part of right lower leg with unspecified severity: Secondary | ICD-10-CM | POA: Diagnosis not present

## 2019-09-26 DIAGNOSIS — I5032 Chronic diastolic (congestive) heart failure: Secondary | ICD-10-CM | POA: Diagnosis not present

## 2019-09-26 DIAGNOSIS — I2510 Atherosclerotic heart disease of native coronary artery without angina pectoris: Secondary | ICD-10-CM | POA: Diagnosis not present

## 2019-09-26 DIAGNOSIS — W1849XD Other slipping, tripping and stumbling without falling, subsequent encounter: Secondary | ICD-10-CM | POA: Diagnosis not present

## 2019-09-26 DIAGNOSIS — S81802D Unspecified open wound, left lower leg, subsequent encounter: Secondary | ICD-10-CM | POA: Diagnosis not present

## 2019-09-26 DIAGNOSIS — I872 Venous insufficiency (chronic) (peripheral): Secondary | ICD-10-CM | POA: Diagnosis not present

## 2019-09-27 ENCOUNTER — Telehealth: Payer: Self-pay

## 2019-09-27 NOTE — Telephone Encounter (Signed)
I spoke with the pt daughter [name] and she states the pt did receive the monitor. She is going to have her sister send a transmission with the monitor and to call my direct office number if she has any questions.

## 2019-09-30 ENCOUNTER — Telehealth: Payer: Self-pay

## 2019-09-30 NOTE — Telephone Encounter (Signed)
Duplicate encounter, see phone note opened 08/26/19.

## 2019-09-30 NOTE — Telephone Encounter (Signed)
The pt daughter [name] forgot to tell her sister to help the pt send the transmission. She is going to call her today and give her my number in case she needs help sending the transmission.

## 2019-09-30 NOTE — Telephone Encounter (Signed)
Douglass Rivers D, CMA 6 minutes ago (2:28 PM)     The pt daughter [name] forgot to tell her sister to help the pt send the transmission. She is going to call her today and give her my number in case she needs help sending the transmission.

## 2019-10-02 ENCOUNTER — Telehealth: Payer: Self-pay | Admitting: *Deleted

## 2019-10-02 NOTE — Telephone Encounter (Signed)
Dasha from [facility] is taking over wound care from [facility].  There was confusion on pts wound care orders and Dasha needed clarification.  Repeated last verbal orders from 08/05/19 encounter. Christen Bame, CMA

## 2019-10-03 ENCOUNTER — Emergency Department (HOSPITAL_COMMUNITY)
Admission: EM | Admit: 2019-10-03 | Discharge: 2019-10-03 | Disposition: A | Discharge status: Home / Self Care | Payer: Medicare Other | Attending: Emergency Medicine | Admitting: Emergency Medicine

## 2019-10-03 ENCOUNTER — Emergency Department (HOSPITAL_COMMUNITY): Payer: Medicare Other

## 2019-10-03 DIAGNOSIS — S0181XA Laceration without foreign body of other part of head, initial encounter: Secondary | ICD-10-CM | POA: Insufficient documentation

## 2019-10-03 DIAGNOSIS — Z7901 Long term (current) use of anticoagulants: Secondary | ICD-10-CM | POA: Insufficient documentation

## 2019-10-03 DIAGNOSIS — Y9289 Other specified places as the place of occurrence of the external cause: Secondary | ICD-10-CM | POA: Diagnosis not present

## 2019-10-03 DIAGNOSIS — S199XXA Unspecified injury of neck, initial encounter: Secondary | ICD-10-CM | POA: Diagnosis not present

## 2019-10-03 DIAGNOSIS — J439 Emphysema, unspecified: Secondary | ICD-10-CM | POA: Diagnosis not present

## 2019-10-03 DIAGNOSIS — I4891 Unspecified atrial fibrillation: Secondary | ICD-10-CM | POA: Diagnosis not present

## 2019-10-03 DIAGNOSIS — R600 Localized edema: Secondary | ICD-10-CM | POA: Diagnosis not present

## 2019-10-03 DIAGNOSIS — Z23 Encounter for immunization: Secondary | ICD-10-CM | POA: Diagnosis not present

## 2019-10-03 DIAGNOSIS — S0191XA Laceration without foreign body of unspecified part of head, initial encounter: Secondary | ICD-10-CM

## 2019-10-03 DIAGNOSIS — Y9301 Activity, walking, marching and hiking: Secondary | ICD-10-CM | POA: Insufficient documentation

## 2019-10-03 DIAGNOSIS — I10 Essential (primary) hypertension: Secondary | ICD-10-CM | POA: Diagnosis not present

## 2019-10-03 DIAGNOSIS — S0990XA Unspecified injury of head, initial encounter: Secondary | ICD-10-CM | POA: Diagnosis not present

## 2019-10-03 DIAGNOSIS — W19XXXA Unspecified fall, initial encounter: Secondary | ICD-10-CM | POA: Diagnosis not present

## 2019-10-03 DIAGNOSIS — R220 Localized swelling, mass and lump, head: Secondary | ICD-10-CM | POA: Diagnosis not present

## 2019-10-03 DIAGNOSIS — S79912A Unspecified injury of left hip, initial encounter: Secondary | ICD-10-CM | POA: Diagnosis not present

## 2019-10-03 DIAGNOSIS — Y999 Unspecified external cause status: Secondary | ICD-10-CM | POA: Diagnosis not present

## 2019-10-03 DIAGNOSIS — S99912A Unspecified injury of left ankle, initial encounter: Secondary | ICD-10-CM | POA: Diagnosis not present

## 2019-10-03 LAB — COMPREHENSIVE METABOLIC PANEL
ALT: 13 U/L (ref 0–44)
AST: 17 U/L (ref 15–41)
Albumin: 3.3 g/dL — ABNORMAL LOW (ref 3.5–5.0)
Alkaline Phosphatase: 59 U/L (ref 38–126)
Anion gap: 10 (ref 5–15)
BUN: 37 mg/dL — ABNORMAL HIGH (ref 8–23)
CO2: 29 mmol/L (ref 22–32)
Calcium: 8.8 mg/dL — ABNORMAL LOW (ref 8.9–10.3)
Chloride: 101 mmol/L (ref 98–111)
Creatinine, Ser: 2.17 mg/dL — ABNORMAL HIGH (ref 0.44–1.00)
GFR calc Af Amer: 22 mL/min — ABNORMAL LOW (ref >60)
GFR calc non Af Amer: 19 mL/min — ABNORMAL LOW (ref >60)
Glucose, Bld: 111 mg/dL — ABNORMAL HIGH (ref 70–99)
Potassium: 4.4 mmol/L (ref 3.5–5.1)
Sodium: 140 mmol/L (ref 135–145)
Total Bilirubin: 1 mg/dL (ref 0.3–1.2)
Total Protein: 6.3 g/dL — ABNORMAL LOW (ref 6.5–8.1)

## 2019-10-03 LAB — URINALYSIS, ROUTINE W REFLEX MICROSCOPIC
Bilirubin Urine: NEGATIVE
Glucose, UA: NEGATIVE mg/dL
Hgb urine dipstick: NEGATIVE
Ketones, ur: NEGATIVE mg/dL
Leukocytes,Ua: NEGATIVE
Nitrite: NEGATIVE
Protein, ur: NEGATIVE mg/dL
Specific Gravity, Urine: 1.016 (ref 1.005–1.030)
pH: 6 (ref 5.0–8.0)

## 2019-10-03 LAB — I-STAT CHEM 8, ED
BUN: 35 mg/dL — ABNORMAL HIGH (ref 8–23)
Calcium, Ion: 1.08 mmol/L — ABNORMAL LOW (ref 1.15–1.40)
Chloride: 100 mmol/L (ref 98–111)
Creatinine, Ser: 2.2 mg/dL — ABNORMAL HIGH (ref 0.44–1.00)
Glucose, Bld: 106 mg/dL — ABNORMAL HIGH (ref 70–99)
HCT: 33 % — ABNORMAL LOW (ref 36.0–46.0)
Hemoglobin: 11.2 g/dL — ABNORMAL LOW (ref 12.0–15.0)
Potassium: 4.4 mmol/L (ref 3.5–5.1)
Sodium: 142 mmol/L (ref 135–145)
TCO2: 27 mmol/L (ref 22–32)

## 2019-10-03 LAB — SAMPLE TO BLOOD BANK

## 2019-10-03 LAB — CBC
HCT: 34.2 % — ABNORMAL LOW (ref 36.0–46.0)
Hemoglobin: 10 g/dL — ABNORMAL LOW (ref 12.0–15.0)
MCH: 27.8 pg (ref 26.0–34.0)
MCHC: 29.2 g/dL — ABNORMAL LOW (ref 30.0–36.0)
MCV: 95 fL (ref 80.0–100.0)
Platelets: 246 10*3/uL (ref 150–400)
RBC: 3.6 MIL/uL — ABNORMAL LOW (ref 3.87–5.11)
RDW: 14.7 % (ref 11.5–15.5)
WBC: 6.9 10*3/uL (ref 4.0–10.5)
nRBC: 0 % (ref 0.0–0.2)

## 2019-10-03 LAB — PROTIME-INR
INR: 1.1 (ref 0.8–1.2)
Prothrombin Time: 13.5 seconds (ref 11.4–15.2)

## 2019-10-03 LAB — LACTIC ACID, PLASMA: Lactic Acid, Venous: 1.1 mmol/L (ref 0.5–1.9)

## 2019-10-03 LAB — ETHANOL: Alcohol, Ethyl (B): <10 mg/dL (ref <10)

## 2019-10-03 MED ORDER — TETANUS-DIPHTH-ACELL PERTUSSIS 5-2.5-18.5 LF-MCG/0.5 IM SUSP
0.5000 mL | Freq: Once | INTRAMUSCULAR | Status: AC
  Administered 2019-10-03: 0.5 mL via INTRAMUSCULAR
  Filled 2019-10-03: qty 0.5

## 2019-10-03 MED ORDER — LIDOCAINE-EPINEPHRINE-TETRACAINE (LET) TOPICAL GEL
3.0000 mL | Freq: Once | TOPICAL | Status: AC
  Administered 2019-10-03: 3 mL via TOPICAL
  Filled 2019-10-03: qty 3

## 2019-10-03 NOTE — ED Triage Notes (Signed)
Pt arrives from home when she fell while walking. Pt is on Eliquis. Pt denies LOC. Family reported to EMS that pt has some memory impairment at baseline, and pt is at her baseline. Pt has a laceration and bruising above her left eye, left ankle pain, and some lumbar tenderness.

## 2019-10-03 NOTE — ED Notes (Signed)
RN notified CT that pt is ready for scans. 

## 2019-10-03 NOTE — Progress Notes (Signed)
Chaplain responded to this Level II fall.  Chaplain connected with the patient prior to evaluation.  Patient was born and raised in [place] and just lives a block over.  Patient was at home with her daughter when she fell.  Chaplain offered active listening and built rapport.  MD arrived for assessment.  No other needs at this time.  Chaplain available for support as needed. [place], MDiv.     10/03/19 0116  Clinical Encounter Type  Visited With Patient;Health care provider  Visit Type Trauma  Referral From Nurse  Consult/Referral To Chaplain

## 2019-10-03 NOTE — ED Notes (Signed)
RN ambulated pt in hallway. Pt noted to have unsteady gait and pt SOB. Pt had to sit in chair before going back to room.

## 2019-10-03 NOTE — ED Notes (Signed)
Pt transported to CT ?

## 2019-10-03 NOTE — ED Notes (Signed)
RN attempted to return family phone call, no answer.

## 2019-10-03 NOTE — ED Provider Notes (Signed)
[facility] EMERGENCY DEPARTMENT Provider Note   CSN: [id number] Arrival date & time: 10/03/19  0108     History Chief Complaint  Patient presents with  . Fall on Blood Thinners LVL 2    [name] is a 86 y.o. female.  86 year old female with a long past medical history to include atrial fibrillation on Eliquis presents to the emergency department today after a fall.  Patient recalls the whole incident but does not know exactly how she fell.  She states that she was walking and she fell hitting the left side of her head on the floor causing her to have some bleeding and some pain that area.  No other associated complaints.  No pain elsewhere.     No past medical history on file.  There are no problems to display for this patient.   OB History   No obstetric history on file.     No family history on file.  Social History   Tobacco Use  . Smoking status: Not on file  Substance Use Topics  . Alcohol use: Not on file  . Drug use: Not on file    Home Medications Prior to Admission medications   Not on File    Allergies    Patient has no allergy information on record.  Review of Systems   Review of Systems  All other systems reviewed and are negative.   Physical Exam Updated Vital Signs BP 131/86 (BP Location: Right Wrist)   Pulse 85   Temp 97.8 F (36.6 C) (Oral)   Resp 16   Ht 5\' 5"  (1.651 m)   Wt 76.2 kg   SpO2 98%   BMI 27.96 kg/m   Physical Exam Vitals and nursing note reviewed.  Constitutional:      Appearance: She is well-developed.  HENT:     Head: Normocephalic.     Mouth/Throat:     Mouth: Mucous membranes are dry.     Pharynx: Oropharynx is clear.  Eyes:     Conjunctiva/sclera: Conjunctivae normal.     Pupils: Pupils are equal, round, and reactive to light.  Cardiovascular:     Rate and Rhythm: Normal rate and regular rhythm.  Pulmonary:     Effort: No respiratory distress.     Breath sounds: No stridor.    Abdominal:     General: There is no distension.  Musculoskeletal:        General: Tenderness (left ankle, left hip and pelvis) present. Normal range of motion.     Cervical back: Normal range of motion.  Skin:    General: Skin is warm and dry.     Comments: Abrasion to left forehead with associated 3 cm laceration, straight, hemostatic with a dressing  Neurological:     General: No focal deficit present.     Mental Status: She is alert.     ED Results / Procedures / Treatments   Labs (all labs ordered are listed, but only abnormal results are displayed) Labs Reviewed  COMPREHENSIVE METABOLIC PANEL - Abnormal; Notable for the following components:      Result Value   Glucose, Bld 111 (*)    BUN 37 (*)    Creatinine, Ser 2.17 (*)    Calcium 8.8 (*)    Total Protein 6.3 (*)    Albumin 3.3 (*)    GFR calc non Af Amer 19 (*)    GFR calc Af Amer 22 (*)    All other components within  normal limits  CBC - Abnormal; Notable for the following components:   RBC 3.60 (*)    Hemoglobin 10.0 (*)    HCT 34.2 (*)    MCHC 29.2 (*)    All other components within normal limits  I-STAT CHEM 8, ED - Abnormal; Notable for the following components:   BUN 35 (*)    Creatinine, Ser 2.20 (*)    Glucose, Bld 106 (*)    Calcium, Ion 1.08 (*)    Hemoglobin 11.2 (*)    HCT 33.0 (*)    All other components within normal limits  ETHANOL  URINALYSIS, ROUTINE W REFLEX MICROSCOPIC  LACTIC ACID, PLASMA  PROTIME-INR  SAMPLE TO BLOOD BANK    EKG None  Radiology DG Ankle Complete Left  Result Date: 10/03/2019 CLINICAL DATA:  Fall EXAM: LEFT ANKLE COMPLETE - 3+ VIEW COMPARISON:  None. FINDINGS: Possible fracture of the medial malleolus visible only on the frontal projection. No ankle effusion. No other evidence of fracture. IMPRESSION: Possible fracture of the medial malleolus. If there is point tenderness in this location, CT of the ankle without contrast may be helpful for confirmation.  Electronically Signed   By: Ulyses Jarred M.D.   On: 10/03/2019 02:21   CT Head Wo Contrast  Result Date: 10/03/2019 CLINICAL DATA:  Fall with facial trauma.  On Eliquis. EXAM: CT HEAD WITHOUT CONTRAST CT CERVICAL SPINE WITHOUT CONTRAST TECHNIQUE: Multidetector CT imaging of the head and cervical spine was performed following the standard protocol without intravenous contrast. Multiplanar CT image reconstructions of the cervical spine were also generated. COMPARISON:  None. FINDINGS: CT HEAD FINDINGS Brain: There is no mass, hemorrhage or extra-axial collection. There is generalized atrophy without lobar predilection. There is hypoattenuation of the periventricular white matter, most commonly indicating chronic ischemic microangiopathy. Vascular: No abnormal hyperdensity of the major intracranial arteries or dural venous sinuses. No intracranial atherosclerosis. Skull: Frontal scalp and left periorbital soft tissue swelling. No skull fracture. Sinuses/Orbits: No fluid levels or advanced mucosal thickening of the visualized paranasal sinuses. No mastoid or middle ear effusion. The orbits are normal. CT CERVICAL SPINE FINDINGS Alignment: No static subluxation. Facets are aligned. Occipital condyles are normally positioned. Skull base and vertebrae: No acute fracture. Soft tissues and spinal canal: No prevertebral fluid or swelling. No visible canal hematoma. Disc levels: No advanced spinal canal or neural foraminal stenosis. Upper chest: Biapical emphysema. Other: Normal visualized paraspinal cervical soft tissues. IMPRESSION: 1. No acute intracranial abnormality. 2. Frontal scalp and left periorbital soft tissue swelling without skull fracture. 3. No acute fracture or static subluxation of the cervical spine. 4. Emphysema (ICD10-J43.9). Electronically Signed   By: Ulyses Jarred M.D.   On: 10/03/2019 02:06   CT Cervical Spine Wo Contrast  Result Date: 10/03/2019 CLINICAL DATA:  Fall with facial trauma.  On  Eliquis. EXAM: CT HEAD WITHOUT CONTRAST CT CERVICAL SPINE WITHOUT CONTRAST TECHNIQUE: Multidetector CT imaging of the head and cervical spine was performed following the standard protocol without intravenous contrast. Multiplanar CT image reconstructions of the cervical spine were also generated. COMPARISON:  None. FINDINGS: CT HEAD FINDINGS Brain: There is no mass, hemorrhage or extra-axial collection. There is generalized atrophy without lobar predilection. There is hypoattenuation of the periventricular white matter, most commonly indicating chronic ischemic microangiopathy. Vascular: No abnormal hyperdensity of the major intracranial arteries or dural venous sinuses. No intracranial atherosclerosis. Skull: Frontal scalp and left periorbital soft tissue swelling. No skull fracture. Sinuses/Orbits: No fluid levels or advanced mucosal thickening of  the visualized paranasal sinuses. No mastoid or middle ear effusion. The orbits are normal. CT CERVICAL SPINE FINDINGS Alignment: No static subluxation. Facets are aligned. Occipital condyles are normally positioned. Skull base and vertebrae: No acute fracture. Soft tissues and spinal canal: No prevertebral fluid or swelling. No visible canal hematoma. Disc levels: No advanced spinal canal or neural foraminal stenosis. Upper chest: Biapical emphysema. Other: Normal visualized paraspinal cervical soft tissues. IMPRESSION: 1. No acute intracranial abnormality. 2. Frontal scalp and left periorbital soft tissue swelling without skull fracture. 3. No acute fracture or static subluxation of the cervical spine. 4. Emphysema (ICD10-J43.9). Electronically Signed   By: Ulyses Jarred M.D.   On: 10/03/2019 02:06   CT Ankle Left Wo Contrast  Result Date: 10/03/2019 CLINICAL DATA:  Ankle pain after a fall.  Negative x-rays EXAM: CT OF THE LEFT ANKLE WITHOUT CONTRAST TECHNIQUE: Multidetector CT imaging of the left ankle was performed according to the standard protocol. Multiplanar  CT image reconstructions were also generated. COMPARISON:  Left ankle radiographs 10/03/2019 FINDINGS: Bones/Joint/Cartilage No evidence of acute fracture or dislocation. Ankle mortise appears intact. No focal bone lesions. Joint spaces are preserved. Ligaments Suboptimally assessed by CT. Muscles and Tendons Diffuse atrophy of the visualized musculature. Major tendons appear intact. Soft tissues Diffuse soft tissue edema. No loculated collections. No soft tissue gas or radiopaque foreign body. IMPRESSION: 1. No evidence of acute fracture or dislocation of the left ankle. 2. Diffuse soft tissue edema. Electronically Signed   By: Lucienne Capers M.D.   On: 10/03/2019 05:24   DG Hip Unilat W or Wo Pelvis 2-3 Views Left  Result Date: 10/03/2019 CLINICAL DATA:  Fall EXAM: DG HIP (WITH OR WITHOUT PELVIS) 2-3V LEFT COMPARISON:  None. FINDINGS: There is no evidence of hip fracture or dislocation. There is no evidence of arthropathy or other focal bone abnormality. IMPRESSION: Negative. Electronically Signed   By: Ulyses Jarred M.D.   On: 10/03/2019 02:14    Procedures .Marland KitchenLaceration Repair  Date/Time: 10/03/2019 8:04 AM Performed by: Merrily Pew, MD Authorized by: Merrily Pew, MD   Consent:    Consent obtained:  Verbal   Consent given by:  Patient   Risks discussed:  Infection, need for additional repair, nerve damage, poor wound healing, poor cosmetic result, pain, retained foreign body, tendon damage and vascular damage   Alternatives discussed:  No treatment, delayed treatment and observation Anesthesia (see MAR for exact dosages):    Anesthesia method:  Topical application   Topical anesthetic:  LET Laceration details:    Location:  Face   Face location:  Forehead   Length (cm):  3   Depth (mm):  3 Repair type:    Repair type:  Simple Pre-procedure details:    Preparation:  Patient was prepped and draped in usual sterile fashion and imaging obtained to evaluate for foreign  bodies Exploration:    Wound exploration: wound explored through full range of motion and entire depth of wound probed and visualized   Treatment:    Area cleansed with:  Saline   Amount of cleaning:  Extensive   Irrigation solution:  Sterile water   Irrigation volume:  150   Irrigation method:  Syringe Skin repair:    Repair method:  Sutures   Suture size:  3-0   Suture material:  Fast-absorbing gut   Suture technique:  Simple interrupted   Number of sutures:  3 Approximation:    Approximation:  Close Post-procedure details:    Dressing:  Antibiotic ointment  Patient tolerance of procedure:  Tolerated well, no immediate complications   (including critical care time)  Medications Ordered in ED Medications  Tdap (BOOSTRIX) injection 0.5 mL (0.5 mLs Intramuscular Given 10/03/19 0235)  lidocaine-EPINEPHrine-tetracaine (LET) topical gel (3 mLs Topical Given 10/03/19 0236)    ED Course  I have reviewed the triage vital signs and the nursing notes.  Pertinent labs & imaging results that were available during my care of the patient were reviewed by me and considered in my medical decision making (see chart for details).    MDM Rules/Calculators/A&P                          Ct/xr affected parts. Update TDAP. Repair lac. Ecg/labs for fall.  Final Clinical Impression(s) / ED Diagnoses Final diagnoses:  Fall, initial encounter  Laceration of head without foreign body, unspecified part of head, initial encounter    Rx / DC Orders ED Discharge Orders    None       Angelys Yetman, Corene Cornea, MD 10/03/19 [phone]

## 2019-10-03 NOTE — ED Notes (Addendum)
RN spoke to pt daughter and reviewed discharge paperwork. Family coming to get pt.

## 2019-10-03 NOTE — Progress Notes (Signed)
Orthopedic Tech Progress Note Patient Details:  [name] 17-Apr-1923 [id number] Level 2 Trauma  Patient ID: [name], female   DOB: 19-May-1923, 86 y.o.   MRN: [id number]   [name] 10/03/2019, 2:16 AM

## 2019-10-03 NOTE — Telephone Encounter (Signed)
Spoke with [name]. She reports pt had a fall yesterday evening and had to be seen in the ED. She is not sure her sister will have a chance to send a transmission today, but does report she passed along our number in the event she needs help. Will check back next week.

## 2019-10-04 ENCOUNTER — Telehealth: Payer: Self-pay

## 2019-10-04 DIAGNOSIS — M79641 Pain in right hand: Secondary | ICD-10-CM | POA: Diagnosis not present

## 2019-10-04 DIAGNOSIS — S62354A Nondisplaced fracture of shaft of fourth metacarpal bone, right hand, initial encounter for closed fracture: Secondary | ICD-10-CM | POA: Diagnosis not present

## 2019-10-04 NOTE — Telephone Encounter (Signed)
HH woud care LVM on nurse line stating they received the order for compression dressing, however they need a verbal for the frequency of changes per week.   You can leave verbals at [phone].

## 2019-10-04 NOTE — Telephone Encounter (Signed)
I left a verbal order for change of 3-layer compression wrap once a week on nurse line [phone].

## 2019-10-05 DIAGNOSIS — S81812D Laceration without foreign body, left lower leg, subsequent encounter: Secondary | ICD-10-CM | POA: Diagnosis not present

## 2019-10-05 DIAGNOSIS — Z4800 Encounter for change or removal of nonsurgical wound dressing: Secondary | ICD-10-CM | POA: Diagnosis not present

## 2019-10-05 DIAGNOSIS — Z7901 Long term (current) use of anticoagulants: Secondary | ICD-10-CM | POA: Diagnosis not present

## 2019-10-05 DIAGNOSIS — Z905 Acquired absence of kidney: Secondary | ICD-10-CM | POA: Diagnosis not present

## 2019-10-05 DIAGNOSIS — Z9181 History of falling: Secondary | ICD-10-CM | POA: Diagnosis not present

## 2019-10-05 DIAGNOSIS — Z7951 Long term (current) use of inhaled steroids: Secondary | ICD-10-CM | POA: Diagnosis not present

## 2019-10-05 DIAGNOSIS — I2510 Atherosclerotic heart disease of native coronary artery without angina pectoris: Secondary | ICD-10-CM | POA: Diagnosis not present

## 2019-10-05 DIAGNOSIS — I509 Heart failure, unspecified: Secondary | ICD-10-CM | POA: Diagnosis not present

## 2019-10-05 DIAGNOSIS — Z79891 Long term (current) use of opiate analgesic: Secondary | ICD-10-CM | POA: Diagnosis not present

## 2019-10-05 DIAGNOSIS — I4891 Unspecified atrial fibrillation: Secondary | ICD-10-CM | POA: Diagnosis not present

## 2019-10-05 DIAGNOSIS — N189 Chronic kidney disease, unspecified: Secondary | ICD-10-CM | POA: Diagnosis not present

## 2019-10-05 DIAGNOSIS — I872 Venous insufficiency (chronic) (peripheral): Secondary | ICD-10-CM | POA: Diagnosis not present

## 2019-10-07 ENCOUNTER — Telehealth: Payer: Self-pay

## 2019-10-07 NOTE — Telephone Encounter (Signed)
[name] from [facility] calling for nursing verbal orders as follows:  1 time(s) weekly for 9 week(s).   Verbal orders given per [facility] protocol  FYI to PCP  Talbot Grumbling, RN

## 2019-10-08 NOTE — Telephone Encounter (Signed)
Spoke with [name] who reports that her sister was supposed to send transmission. Informed transmission was not received. [name] will have her sister assist her mother with transmission tonight when they return home.

## 2019-10-09 DIAGNOSIS — S81812D Laceration without foreign body, left lower leg, subsequent encounter: Secondary | ICD-10-CM | POA: Diagnosis not present

## 2019-10-09 DIAGNOSIS — I2510 Atherosclerotic heart disease of native coronary artery without angina pectoris: Secondary | ICD-10-CM | POA: Diagnosis not present

## 2019-10-09 DIAGNOSIS — N189 Chronic kidney disease, unspecified: Secondary | ICD-10-CM | POA: Diagnosis not present

## 2019-10-09 DIAGNOSIS — I872 Venous insufficiency (chronic) (peripheral): Secondary | ICD-10-CM | POA: Diagnosis not present

## 2019-10-09 DIAGNOSIS — I4891 Unspecified atrial fibrillation: Secondary | ICD-10-CM | POA: Diagnosis not present

## 2019-10-09 DIAGNOSIS — I509 Heart failure, unspecified: Secondary | ICD-10-CM | POA: Diagnosis not present

## 2019-10-10 ENCOUNTER — Telehealth: Payer: Self-pay

## 2019-10-10 ENCOUNTER — Ambulatory Visit (INDEPENDENT_AMBULATORY_CARE_PROVIDER_SITE_OTHER): Payer: Medicare Other | Admitting: *Deleted

## 2019-10-10 DIAGNOSIS — I4821 Permanent atrial fibrillation: Secondary | ICD-10-CM | POA: Diagnosis not present

## 2019-10-10 LAB — CUP PACEART REMOTE DEVICE CHECK
Battery Impedance: 1167 Ohm
Battery Remaining Longevity: 55 mo
Battery Voltage: 2.78 V
Brady Statistic AP VP Percent: 6 %
Brady Statistic AP VS Percent: 2 %
Brady Statistic AS VP Percent: 1 %
Brady Statistic AS VS Percent: 91 %
Date Time Interrogation Session: 20210812135214
Implantable Lead Implant Date: 20050523
Implantable Lead Implant Date: 20050523
Implantable Lead Location: 753859
Implantable Lead Location: 753860
Implantable Lead Model: 5076
Implantable Lead Model: 5076
Implantable Pulse Generator Implant Date: 20140915
Lead Channel Impedance Value: 495 Ohm
Lead Channel Impedance Value: 758 Ohm
Lead Channel Pacing Threshold Amplitude: 0.625 V
Lead Channel Pacing Threshold Amplitude: 1 V
Lead Channel Pacing Threshold Pulse Width: 0.4 ms
Lead Channel Pacing Threshold Pulse Width: 0.4 ms
Lead Channel Setting Pacing Amplitude: 2 V
Lead Channel Setting Pacing Amplitude: 2 V
Lead Channel Setting Pacing Pulse Width: 0.4 ms
Lead Channel Setting Sensing Sensitivity: 5.6 mV

## 2019-10-10 NOTE — Telephone Encounter (Signed)
The pt daughter [name] called to get help sending the transmission. Transmission received. I added her to the remote schedule. I told her the nurse will read it and if she see something concerning she will get a phone call. If everything is normal she will not receive a phone call.

## 2019-10-10 NOTE — Telephone Encounter (Signed)
Routed to Dr. [name] and Dr. Lovena Le

## 2019-10-10 NOTE — Telephone Encounter (Signed)
After several calls requesting transmission, Remote transmission received and reviewed.  Pt is in AF since approximately 8/5.

## 2019-10-11 NOTE — Telephone Encounter (Signed)
I would continue amiodarone and consider cardioversion if it persists. Will defer ultimate decision to Dr. Lovena Le.

## 2019-10-14 NOTE — Telephone Encounter (Signed)
Will offer f/u to Pt this Friday 10/18/19 with Dr. Lovena Le to discuss.

## 2019-10-14 NOTE — Progress Notes (Signed)
Remote pacemaker transmission.   

## 2019-10-16 DIAGNOSIS — I872 Venous insufficiency (chronic) (peripheral): Secondary | ICD-10-CM | POA: Diagnosis not present

## 2019-10-16 DIAGNOSIS — I4891 Unspecified atrial fibrillation: Secondary | ICD-10-CM | POA: Diagnosis not present

## 2019-10-16 DIAGNOSIS — N189 Chronic kidney disease, unspecified: Secondary | ICD-10-CM | POA: Diagnosis not present

## 2019-10-16 DIAGNOSIS — I2510 Atherosclerotic heart disease of native coronary artery without angina pectoris: Secondary | ICD-10-CM | POA: Diagnosis not present

## 2019-10-16 DIAGNOSIS — I509 Heart failure, unspecified: Secondary | ICD-10-CM | POA: Diagnosis not present

## 2019-10-16 DIAGNOSIS — S81812D Laceration without foreign body, left lower leg, subsequent encounter: Secondary | ICD-10-CM | POA: Diagnosis not present

## 2019-10-18 ENCOUNTER — Telehealth: Payer: Self-pay

## 2019-10-18 ENCOUNTER — Other Ambulatory Visit: Payer: Self-pay

## 2019-10-18 ENCOUNTER — Ambulatory Visit (INDEPENDENT_AMBULATORY_CARE_PROVIDER_SITE_OTHER): Payer: Medicare Other | Admitting: Internal Medicine

## 2019-10-18 ENCOUNTER — Encounter: Payer: Self-pay | Admitting: Internal Medicine

## 2019-10-18 VITALS — BP 110/80 | HR 94 | Ht 65.0 in | Wt 170.8 lb

## 2019-10-18 DIAGNOSIS — I4891 Unspecified atrial fibrillation: Secondary | ICD-10-CM | POA: Diagnosis not present

## 2019-10-18 DIAGNOSIS — Z9861 Coronary angioplasty status: Secondary | ICD-10-CM

## 2019-10-18 DIAGNOSIS — Z950 Presence of cardiac pacemaker: Secondary | ICD-10-CM

## 2019-10-18 DIAGNOSIS — S52531A Colles' fracture of right radius, initial encounter for closed fracture: Secondary | ICD-10-CM | POA: Diagnosis not present

## 2019-10-18 DIAGNOSIS — I495 Sick sinus syndrome: Secondary | ICD-10-CM | POA: Diagnosis not present

## 2019-10-18 DIAGNOSIS — I2510 Atherosclerotic heart disease of native coronary artery without angina pectoris: Secondary | ICD-10-CM

## 2019-10-18 DIAGNOSIS — M25531 Pain in right wrist: Secondary | ICD-10-CM | POA: Diagnosis not present

## 2019-10-18 NOTE — Progress Notes (Signed)
HPI [name] returns today for followup. She is a pleasant elderly woman with now chronic atrial fib, symptomatic sinus node dysfunction, COPD, s/p PPM insertion. She has not had syncope. She was on amiodarone but this was stopped. She has not been in the hospital except for a fall she sustained a couple of months ago. She denies chest pain. She has class 2 dyspnea which is multifactorial.   Allergies  Allergen Reactions  . Baclofen Other (See Comments)    Confusion occurred with taking 3 at the same time  . Codeine Phosphate Nausea Only  . Hydrochlorothiazide Other (See Comments)    Possible acute gout or arthritis of wrist  . Montelukast Sodium Other (See Comments)    Reaction not recalled ??  Lebron Quam [Hydrocodone-Acetaminophen] Nausea And Vomiting     Current Outpatient Medications  Medication Sig Dispense Refill  . albuterol (PROVENTIL) (2.5 MG/3ML) 0.083% nebulizer solution USE 1 VIAL VIA NEBULIZER EVERY 4 HOURS AS NEEDED FOR WHEEZING OR SHORTNESS OF BREATH 360 mL 5  . albuterol (VENTOLIN HFA) 108 (90 Base) MCG/ACT inhaler Inhale 2 puffs into the lungs every 4 (four) hours as needed for wheezing or shortness of breath. 18 g 5  . atorvastatin (LIPITOR) 20 MG tablet TAKE 1 TABLET BY MOUTH DAILY 90 tablet 3  . doxycycline (VIBRA-TABS) 100 MG tablet TAKE 1 TABLET(100 MG) BY MOUTH TWICE DAILY FOR 7 DAYS 14 tablet 0  . ELIQUIS 2.5 MG TABS tablet TAKE 1 TABLET(2.5 MG) BY MOUTH TWICE DAILY 60 tablet 5  . fluticasone (FLONASE) 50 MCG/ACT nasal spray SHAKE LIQUID AND USE 2 SPRAYS IN EACH NOSTRIL DAILY 48 g 3  . Fluticasone-Umeclidin-Vilant (TRELEGY ELLIPTA) 100-62.5-25 MCG/INH AEPB Inhale 1 puff into the lungs daily. 60 each 5  . furosemide (LASIX) 40 MG tablet TAKE 1/2 TABLET(20 MG) BY MOUTH DAILY AS NEEDED FOR FLUID RETENTION OR SWELLING 45 tablet 1  . metoprolol succinate (TOPROL-XL) 25 MG 24 hr tablet TAKE 1 TABLET BY MOUTH DAILY 90 tablet 3  . nitroGLYCERIN (NITROSTAT) 0.4 MG SL  tablet Place 1 tablet (0.4 mg total) under the tongue every 5 (five) minutes as needed for chest pain (x 3 tabs). 25 tablet PRN  . omeprazole (PRILOSEC) 20 MG capsule TAKE 1 CAPSULE(20 MG) BY MOUTH DAILY 90 capsule 3  . Spacer/Aero-Holding Chambers (AEROCHAMBER MV) inhaler Use as instructed 1 each 0  . traMADol (ULTRAM) 50 MG tablet TAKE 1 TABLET BY MOUTH EVERY 6 HOURS AS NEEDED FOR PAIN 30 tablet 2   No current facility-administered medications for this visit.     Past Medical History:  Diagnosis Date  . Abnormal mammogram, unspecified 08/23/2010   Followup imaging reassuring.   . Acute appendicitis with rupture   . Acute on chronic diastolic heart failure ([place]) 10/16/2017  . ADENOMATOUS COLONIC POLYP 03/01/2003   Qualifier: Diagnosis of  By: McDiarmid MD, Sherren Mocha Multiple benign polyps of cecum, ascending, transverse and sigmoid colon by 8/09 colonoscopy by Dr Cristina Gong  Colonoscopy by Dr Cristina Gong for iron-deficiency anemia on 04/27/2010 showed three sessile polyps that were in ascending (3 mm x 9 mm), transverse (4 mm), and cecum (3 mm).  All three were tubular adenomas that were negative for high grade dysplasia or malignancy on pathology. Dr Cristina Gong called the polpys benign and not requiring follow-up in view of the patients age.    . Adrenal adenoma    Incidentaloma  . AF (paroxysmal atrial fibrillation) ([place]) 11/11/2010   Hospitalization (9/8-9/10, Dr Mallie Mussel  Smith, III, Cardiology) for Paroxysmal Atrial Fibrillation with RVR and anginal pain secondary to demand/supply mismatch in setting of RVR with known circumflex artery branch disease.    Marland Kitchen AKI (acute kidney injury) ([place])   . ANXIETY 04/27/2006   Qualifier: Diagnosis of  By: McDiarmid MD, Sherren Mocha    . Benign essential tremor 02/04/2016  . Candidiasis of the esophagus 11/26/2007  . CAP (community acquired pneumonia) 02/25/2015  . Cataract 2013   Bilateral   . Cellulitis of leg, right 10/01/2012  . Cellulitis of right lower extremity   . Chest pain  with moderate risk of acute coronary syndrome 05/21/2015  . Chronic kidney disease (CKD), stage III (moderate)   . Concussion with loss of consciousness   . COPD 04/27/2006   Qualifier: Diagnosis of  By: McDiarmid MD, Sherren Mocha    . COPD exacerbation ([place])   . COPD, severe   . Decreased functional mobility and endurance 03/17/2015  . Dementia [facility]), possible Alzhemier's type 10/26/2012   Montreal Cognitive Assessment (25 out of 30, points off in visuospatial and and short-term memory).  Independent in iADLs and ADLs.    . Diastolic heart failure ([place]) 07/30/2015  . DISC WITH RADICULOPATHY 04/27/2006   Qualifier: Diagnosis of  By: McDiarmid MD, Sherren Mocha    . Dyspnea   . EDEMA-LEGS,DUE TO VENOUS OBSTRUCT. 04/27/2006   Qualifier: Diagnosis of  By: McDiarmid MD, Sherren Mocha    . Fall 10/27/2016  . Fracture of 5th metatarsal, Right, avulsion fx of tip styloid 11/09/2017  . GERD (gastroesophageal reflux disease) 11/08/2015  . 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 Normal uric acid serum level at time of attack.    . Hand trauma, right, initial encounter 03/20/2018  . HERNIA, HIATAL, NONCONGENITAL 04/27/2006   Qualifier: Diagnosis of  By: McDiarmid MD, Sherren Mocha    . High risk medications (not anticoagulants) long-term use 03/05/2012  . History of Hemorrhoids 04/27/2006   Qualifier: Diagnosis of  By: McDiarmid MD, Sherren Mocha    . History of iron deficiency 01/16/2015  . History of Iron deficiency anemia due to chronic GI blood loss 08/05/2010   Dr Cristina Gong (GI) has evaluated with EGD, colonoscopy, and video capsular endoscopy in 2011 & 2012.  All have been unrevealing as to an origin of IDA.  OV with Dr Cristina Gong (10/28/10) assessment of blood in stool per hemoccult and GER. Hbg 12.1 g/dL, MCV 91.8, Ferritin 30 ng/mL. Patient taking on ferrous sulfate tab daily.   EGD on 03/06/12 by Dr Cristina Gong for IDA non-obstructing Schatzki's ring at [place]  . Hx of colonoscopy  with polypectomy 04/27/2010   Dr Cristina Gong found three  tubular adenomas each less than 10 mm size  . Hypertension   . Hypotension 07/09/2015  . Impaired mobility and ADLs 09/01/2017  . Iron deficiency anemia 08/05/2010   Dr Cristina Gong (GI) has evaluated with EGD, colonoscopy, and video capsular endoscopy in 2011 & 2012.  All have been unrevealing as to an origin of IDA.  OV with Dr Cristina Gong (10/28/10) assessment of blood in stool per hemoccult and GER. Hbg 12.1 g/dL, MCV 91.8, Ferritin 30 ng/mL. Patient taking on ferrous sulfate tab daily.   EGD on 03/06/12 by Dr Cristina Gong for IDA non-obstructing Schatzki's ring at Gastroesophageal junction, otherwise normal esophagus and stomach.     . Laceration of arm, unspecified laterality, initial encounter 11/03/2017  . Left leg cellulitis   . Left Metatarsal fracture, 5th,  avulsion styloid 12/29/2017  . Leg cramps 05/29/2012  . Low back pain   . Lumbar herniated disc    History of HNP L4/5 in 2003  . Macular degeneration, bilateral 10/04/2010   Right eye is wet MD, the other is dry macular degeneration (ARMD). Pt undergoing some form of vascular endothelial growth factor inhibition intraocular therapy.    . Mild cognitive impairment 10/26/2012   (10/25/12) Failed MiniCog screen  . MUSCLE CRAMPS 03/11/2010   Qualifier: Diagnosis of  By: McDiarmid MD, Sherren Mocha    . Muscle spasm of back 09/24/2013  . Myocardial infarct, old   . Nocturia 10/28/2016  . Numbness and tingling in hands 07/21/2011  . Pacemaker    MDT  . Paroxysmal atrial fibrillation (HCC)   . Persistent atrial fibrillation ([place]) 05/31/2014  . PREDIABETES 09/11/2007   Qualifier: Diagnosis of  By: McDiarmid MD, Sherren Mocha    . Retinal hemorrhage of left eye 06/2010  . RHINITIS, ALLERGIC 04/27/2006   Qualifier: Diagnosis of  By: McDiarmid MD, Sherren Mocha    . SCHATZKI'S RING, HX OF 11/26/2007   Qualifier: Diagnosis of  By: McDiarmid MD, Sherren Mocha  An EGD was performed by Dr Cristina Gong on 04/27/2010 for iron deficiency anemia. There was a a  transient hiatal hernia with Schatzki's ring. Stomach and duodenum were normal. EGD on 03/06/12 by Dr Cristina Gong for IDA non-obstructing Schatzki's ring at Gastroesophageal junction, otherwise normal esophagus and stomach.    . Seborrheic keratosis, right anterior thigh 12/12/2013  . Shoulder pain, left 01/09/2014  . SICK SINUS SYNDROME 04/27/2006   S/P dual chamber pacemaker placement by Dr Osie Cheeks (EPS-Card) with pacemaker lead extraction & reinsertion - 07/25/2003  Hospitalization (9/8-9/10, Dr Daneen Schick, III, Cardiology) for Paroxysmal Atrial Fibrillation with RVR and anginal pain secondary to demand/supply mismatch in setting of RVR with known circumflex artery branch disease.    . Sick sinus syndrome with tachycardia ([place])    MDT  . Soft tissue injury of foot 05/03/2011  . Solar lentigo 06/15/2012  . Solitary kidney, acquired 05/17/2010   Surgical removal for transitional cell cancer by Tresa Endo, MD ([place]). Surveillance cystoscopy by Dr Alinda Money [facility]) on 10/19/12 without evidence of cystoscopic recurrence. Recommend RTC one year for cystoscopy.   Marland Kitchen Spinal stenosis, lumbar   . Transitional cell carcinoma of ureter, history   . Traumatic ecchymosis of left foot 12/07/2017  . Trigger point with neck pain 10/26/2018  . Urge incontinence 12/13/2011   Diagnosed in 10/2011 by Dr Bjorn Loser (Urology)   . Venous stasis ulcer of ankle, left ([place]) 10/24/2014  . VENTRICULAR HYPERTROPHY, LEFT 08/28/2008   Qualifier: Diagnosis of  By: McDiarmid MD, Sherren Mocha    . VITAMIN B12 DEFICIENCY 10/07/2009   Qualifier: Diagnosis of  By: McDiarmid MD, Sherren Mocha  Dx based on a post-TKR anemia work-up Low normal serum B12 with high Methylmalonic acid and homocysteine level  Vit B12 serum level (10/28/10) > 1500 pg/mL   . Vitamin D deficiency 11/02/2010   Serum vitamin D 25(OH) = 10.9 ng/mL (30 -100) on 10/28/10 c/w Vitamin D deficiency.     . Wound of left foot 12/29/2017    ROS:   All systems reviewed and  negative except as noted in the HPI.   Past Surgical History:  Procedure Laterality Date  . BREAST SURGERY     breast reduction  . CARDIOVERSION N/A 10/20/2017   Procedure: CARDIOVERSION;  Surgeon: Josue Hector, MD;  Location: [facility] ENDOSCOPY;  Service: Cardiovascular;  Laterality: N/A;  .  CHOLECYSTECTOMY    . CORONARY ANGIOPLASTY WITH STENT PLACEMENT  2010   BMS RCA, OM2 occluded  . CYSTOSCOPY  09/30/2015   No cystoscopic evidence of uroepithelial neoplasm.  Follow up surveillance cystoscopy in one year  . CYSTOSCOPY  10/13/2017   Dr Raynelle [facility])  . DUPUYTREN CONTRACTURE RELEASE Right 05/22/2014   Procedure: DUPUYTREN RELEASE AND REPAIR AS NECESSARY RIGHT RING FINGER AND MIDDLE FINGER;  Surgeon: Roseanne Kaufman, MD;  Location: [place];  Service: Orthopedics;  Laterality: Right;  . ESOPHAGOGASTRODUODENOSCOPY  04/27/2010   Dr Cristina Gong - found transient H/H & Schatzki's ring  . ESOPHAGOGASTRODUODENOSCOPY ENDOSCOPY  03/06/2012   Dr Cristina Gong - found non-obstuctive Schatzki's ring at [employer] o/w normal EGD.   Marland Kitchen EYE SURGERY     bilateral cataracts  . KNEE ARTHROSCOPY W/ SYNOVECTOMY  11/2009, left knee   Dr Wynelle Link  . NEPHRECTOMY  For transition cell cancer    Dr Tresa Endo, surgeon  . PACEMAKER GENERATOR CHANGE N/A 11/12/2012   Procedure: PACEMAKER GENERATOR CHANGE;  Surgeon: Evans Lance, MD; Medtronic [facility] dual-chamber pacemaker serial number [id number]; Laterality: Right  . PACEMAKER INSERTION  2005   Dr Doreatha Lew  . PACEMAKER LEAD REMOVAL  2005   Removal and reinsertion of atrial and ventricular leads due to migration  . REPLACEMENT TOTAL KNEE  2009, Right knee   Dr Wynelle Link  . REPLACEMENT TOTAL KNEE  05/2009, Left knee   Dr Wynelle Link  . TONSILLECTOMY    . TRIGGER FINGER RELEASE Right 05/22/2014   Procedure: RIGHT HAND A-1 PULLEY RELEASE ;  Surgeon: Roseanne Kaufman, MD;  Location: [place];  Service: Orthopedics;  Laterality: Right;     Family History  Problem Relation  Age of Onset  . Dementia Mother   . Heart disease Father   . Heart attack Father      Social History   Socioeconomic History  . Marital status: Widowed    Spouse name: Not on file  . Number of children: 4  . Years of education: Not on file  . Highest education level: Not on file  Occupational History  . Occupation: Art therapist: RETIRED    Comment: Retired  Tobacco Use  . Smoking status: Former Smoker    Packs/day: 1.00    Types: Cigarettes    Quit date: 03/02/2009    Years since quitting: 10.6  . Smokeless tobacco: Never Used  Vaping Use  . Vaping Use: Never used  Substance and Sexual Activity  . Alcohol use: Yes    Alcohol/week: 13.0 standard drinks    Types: 7 Glasses of wine, 6 Standard drinks or equivalent per week    Comment: 5 oz white wine per day  . Drug use: No  . Sexual activity: Never  Other Topics Concern  . 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    Semi-retired Futures trader.     Pt has home nebulizer for inhalation therapies.   Former Smoker   Smoking Status:  quit > 5 years ago      (+) DNR status per discussion with Dr McDiarmid 10/25/12 and reiterated 06/20/13 office visit .   (+) Living Will/Advance Directive   (+) HC-POA: Cecile Sheerer Causey (pt's dgt)      Current  Social History 09/22/2016        Patient lives with daughter, [name] (flight attendant), in two level home 09/22/2016   Transportation: Patient has own vehicle 09/22/2016   Important Relationships 4 daughters and 24 yo grandson 09/22/2016    Pets: None 09/22/2016   Education / Work:  College/ Retired Futures trader 09/22/2016   Interests / Fun: Reads 2 books per week, watches TV, entertains 09/22/2016   Current Stressors: 86 yo house in need of outside repairs 09/22/2016   Religious /  Personal Beliefs: Joanie Coddington 09/22/2016   Other: Had a wonderful marriage/ Very thankful/ wants to continue traveling/ closest friends are all gone 09/22/2016   L. [place]atte, RN, BSN                                                                                                  Social Determinants of Health   Financial Resource Strain:   . Difficulty of Paying Living Expenses: Not on file  Food Insecurity:   . Worried About Charity fundraiser in the Last Year: Not on file  . Ran Out of Food in the Last Year: Not on file  Transportation Needs:   . Lack of Transportation (Medical): Not on file  . Lack of Transportation (Non-Medical): Not on file  Physical Activity:   . Days of Exercise per Week: Not on file  . Minutes of Exercise per Session: Not on file  Stress:   . Feeling of Stress : Not on file  Social Connections:   . Frequency of Communication with Friends and Family: Not on file  . Frequency of Social Gatherings with Friends and Family: Not on file  . Attends Religious Services: Not on file  . Active Member of Clubs or Organizations: Not on file  . Attends Archivist Meetings: Not on file  . Marital Status: Not on file  Intimate Partner Violence:   . Fear of Current or Ex-Partner: Not on file  . Emotionally Abused: Not on file  . Physically Abused: Not on file  . Sexually Abused: Not on file     BP 110/80   Pulse 94   Ht 5\' 5"  (1.651 m)   Wt 170 lb 12.8 oz (77.5 kg)   SpO2 99%   BMI 28.42 kg/m   Physical Exam:  Well appearing elderly woman, NAD HEENT: Unremarkable Neck:  7 cm JVD, no thyromegally Lymphatics:  No adenopathy Back:  No CVA tenderness Lungs:  Clear with no wheezes HEART:  Regular rate rhythm, no murmurs, no rubs, no clicks Abd:  soft, positive bowel sounds, no organomegally, no rebound, no guarding Ext:  2 plus pulses, 1+ edema, no cyanosis, no clubbing Skin:  No rashes no nodules Neuro:  CN II through XII intact, motor grossly  intact  EKG - none  DEVICE  Normal device function.  See PaceArt for details.   Assess/Plan: 1. Persistent atrial fib - She is off of her systemic anti-coagulation. I do not think that she is a candidate for DCCV and I do not think that her heart will stay in  NSR. I do not recommend DCCV nor amiodarone. Rate control. 2. PPM - her medtronic DDD PM is working normally. 3. Diastolic heart failure - her symptoms are class 2. She will continue her current meds.  [name].

## 2019-10-18 NOTE — Patient Instructions (Signed)
Medication Instructions:  Your physician recommends that you continue on your current medications as directed. Please refer to the Current Medication list given to you today.  Labwork: None ordered.  Testing/Procedures: None ordered.  Follow-Up: Your physician wants you to follow-up in: one year with Dr. Lovena Le.   You will receive a reminder letter in the mail two months in advance. If you don't receive a letter, please call our office to schedule the follow-up appointment.  Remote monitoring is used to monitor your Pacemaker from home. This monitoring reduces the number of office visits required to check your device to one time per year. It allows [place] to keep an eye on the functioning of your device to ensure it is working properly. You are scheduled for a device check from home on 01/09/2020. You may send your transmission at any time that day. If you have a wireless device, the transmission will be sent automatically. After your physician reviews your transmission, you will receive a postcard with your next transmission date.  Any Other Special Instructions Will Be Listed Below (If Applicable).  If you need a refill on your cardiac medications before your next appointment, please call your pharmacy.

## 2019-10-18 NOTE — Telephone Encounter (Signed)
[name] with [facility] calls nurse line requesting VO to change wound care orders.   The current wraps being used on patient are causing blisters.   Going forward [name] will wrap with xeroform guaze and wrap with regular cotton dressing.   Verbals given to change.

## 2019-10-22 DIAGNOSIS — S62354A Nondisplaced fracture of shaft of fourth metacarpal bone, right hand, initial encounter for closed fracture: Secondary | ICD-10-CM | POA: Diagnosis not present

## 2019-10-22 DIAGNOSIS — S52531A Colles' fracture of right radius, initial encounter for closed fracture: Secondary | ICD-10-CM | POA: Diagnosis not present

## 2019-11-02 DIAGNOSIS — S81812D Laceration without foreign body, left lower leg, subsequent encounter: Secondary | ICD-10-CM | POA: Diagnosis not present

## 2019-11-02 DIAGNOSIS — I509 Heart failure, unspecified: Secondary | ICD-10-CM | POA: Diagnosis not present

## 2019-11-02 DIAGNOSIS — I2510 Atherosclerotic heart disease of native coronary artery without angina pectoris: Secondary | ICD-10-CM | POA: Diagnosis not present

## 2019-11-02 DIAGNOSIS — N189 Chronic kidney disease, unspecified: Secondary | ICD-10-CM | POA: Diagnosis not present

## 2019-11-02 DIAGNOSIS — I4891 Unspecified atrial fibrillation: Secondary | ICD-10-CM | POA: Diagnosis not present

## 2019-11-02 DIAGNOSIS — I872 Venous insufficiency (chronic) (peripheral): Secondary | ICD-10-CM | POA: Diagnosis not present

## 2019-11-04 DIAGNOSIS — I509 Heart failure, unspecified: Secondary | ICD-10-CM | POA: Diagnosis not present

## 2019-11-04 DIAGNOSIS — Z905 Acquired absence of kidney: Secondary | ICD-10-CM | POA: Diagnosis not present

## 2019-11-04 DIAGNOSIS — Z7951 Long term (current) use of inhaled steroids: Secondary | ICD-10-CM | POA: Diagnosis not present

## 2019-11-04 DIAGNOSIS — Z4800 Encounter for change or removal of nonsurgical wound dressing: Secondary | ICD-10-CM | POA: Diagnosis not present

## 2019-11-04 DIAGNOSIS — Z9181 History of falling: Secondary | ICD-10-CM | POA: Diagnosis not present

## 2019-11-04 DIAGNOSIS — N189 Chronic kidney disease, unspecified: Secondary | ICD-10-CM | POA: Diagnosis not present

## 2019-11-04 DIAGNOSIS — I2510 Atherosclerotic heart disease of native coronary artery without angina pectoris: Secondary | ICD-10-CM | POA: Diagnosis not present

## 2019-11-04 DIAGNOSIS — Z79891 Long term (current) use of opiate analgesic: Secondary | ICD-10-CM | POA: Diagnosis not present

## 2019-11-04 DIAGNOSIS — Z7901 Long term (current) use of anticoagulants: Secondary | ICD-10-CM | POA: Diagnosis not present

## 2019-11-04 DIAGNOSIS — S81812D Laceration without foreign body, left lower leg, subsequent encounter: Secondary | ICD-10-CM | POA: Diagnosis not present

## 2019-11-04 DIAGNOSIS — I4891 Unspecified atrial fibrillation: Secondary | ICD-10-CM | POA: Diagnosis not present

## 2019-11-04 DIAGNOSIS — I872 Venous insufficiency (chronic) (peripheral): Secondary | ICD-10-CM | POA: Diagnosis not present

## 2019-11-05 DIAGNOSIS — S62354A Nondisplaced fracture of shaft of fourth metacarpal bone, right hand, initial encounter for closed fracture: Secondary | ICD-10-CM | POA: Diagnosis not present

## 2019-11-05 DIAGNOSIS — S52531D Colles' fracture of right radius, subsequent encounter for closed fracture with routine healing: Secondary | ICD-10-CM | POA: Diagnosis not present

## 2019-11-06 DIAGNOSIS — S81812D Laceration without foreign body, left lower leg, subsequent encounter: Secondary | ICD-10-CM | POA: Diagnosis not present

## 2019-11-06 DIAGNOSIS — I2510 Atherosclerotic heart disease of native coronary artery without angina pectoris: Secondary | ICD-10-CM | POA: Diagnosis not present

## 2019-11-06 DIAGNOSIS — I509 Heart failure, unspecified: Secondary | ICD-10-CM | POA: Diagnosis not present

## 2019-11-06 DIAGNOSIS — I872 Venous insufficiency (chronic) (peripheral): Secondary | ICD-10-CM | POA: Diagnosis not present

## 2019-11-06 DIAGNOSIS — N189 Chronic kidney disease, unspecified: Secondary | ICD-10-CM | POA: Diagnosis not present

## 2019-11-06 DIAGNOSIS — I4891 Unspecified atrial fibrillation: Secondary | ICD-10-CM | POA: Diagnosis not present

## 2019-11-13 ENCOUNTER — Telehealth: Payer: Self-pay | Admitting: Family Medicine

## 2019-11-13 DIAGNOSIS — I872 Venous insufficiency (chronic) (peripheral): Secondary | ICD-10-CM | POA: Diagnosis not present

## 2019-11-13 DIAGNOSIS — I2510 Atherosclerotic heart disease of native coronary artery without angina pectoris: Secondary | ICD-10-CM | POA: Diagnosis not present

## 2019-11-13 DIAGNOSIS — I4891 Unspecified atrial fibrillation: Secondary | ICD-10-CM | POA: Diagnosis not present

## 2019-11-13 DIAGNOSIS — S81812D Laceration without foreign body, left lower leg, subsequent encounter: Secondary | ICD-10-CM | POA: Diagnosis not present

## 2019-11-13 DIAGNOSIS — H353231 Exudative age-related macular degeneration, bilateral, with active choroidal neovascularization: Secondary | ICD-10-CM | POA: Diagnosis not present

## 2019-11-13 DIAGNOSIS — H43822 Vitreomacular adhesion, left eye: Secondary | ICD-10-CM | POA: Diagnosis not present

## 2019-11-13 DIAGNOSIS — H35371 Puckering of macula, right eye: Secondary | ICD-10-CM | POA: Diagnosis not present

## 2019-11-13 DIAGNOSIS — H35033 Hypertensive retinopathy, bilateral: Secondary | ICD-10-CM | POA: Diagnosis not present

## 2019-11-13 DIAGNOSIS — N189 Chronic kidney disease, unspecified: Secondary | ICD-10-CM | POA: Diagnosis not present

## 2019-11-13 DIAGNOSIS — I509 Heart failure, unspecified: Secondary | ICD-10-CM | POA: Diagnosis not present

## 2019-11-13 NOTE — Telephone Encounter (Signed)
Arbie Cookey from [facility] is calling and would like patient to be evaluated for physical therapy in home.  No longer needs wound care but would like to have family do lotion daily and will monitor.   The best call back number for Arbie Cookey is [phone]

## 2019-11-13 NOTE — Telephone Encounter (Signed)
Verbal orders given to Arbie Cookey for PT eval.  Will forward to MD to let him know.  Rod Majerus,CMA

## 2019-11-17 DIAGNOSIS — I4891 Unspecified atrial fibrillation: Secondary | ICD-10-CM | POA: Diagnosis not present

## 2019-11-17 DIAGNOSIS — I2510 Atherosclerotic heart disease of native coronary artery without angina pectoris: Secondary | ICD-10-CM | POA: Diagnosis not present

## 2019-11-17 DIAGNOSIS — I509 Heart failure, unspecified: Secondary | ICD-10-CM | POA: Diagnosis not present

## 2019-11-17 DIAGNOSIS — S81812D Laceration without foreign body, left lower leg, subsequent encounter: Secondary | ICD-10-CM | POA: Diagnosis not present

## 2019-11-17 DIAGNOSIS — I872 Venous insufficiency (chronic) (peripheral): Secondary | ICD-10-CM | POA: Diagnosis not present

## 2019-11-17 DIAGNOSIS — N189 Chronic kidney disease, unspecified: Secondary | ICD-10-CM | POA: Diagnosis not present

## 2019-11-18 ENCOUNTER — Telehealth: Payer: Self-pay

## 2019-11-18 NOTE — Telephone Encounter (Signed)
Darnelle from [facility] calling for PT verbal orders as follows:  2 time(s) weekly for 2 week(s), then 1 time(s) weekly for 1 week(s)  Verbal orders given per [facility] protocol  Talbot Grumbling, RN

## 2019-11-20 DIAGNOSIS — I4891 Unspecified atrial fibrillation: Secondary | ICD-10-CM | POA: Diagnosis not present

## 2019-11-20 DIAGNOSIS — S81812D Laceration without foreign body, left lower leg, subsequent encounter: Secondary | ICD-10-CM | POA: Diagnosis not present

## 2019-11-20 DIAGNOSIS — N189 Chronic kidney disease, unspecified: Secondary | ICD-10-CM | POA: Diagnosis not present

## 2019-11-20 DIAGNOSIS — I509 Heart failure, unspecified: Secondary | ICD-10-CM | POA: Diagnosis not present

## 2019-11-20 DIAGNOSIS — I2510 Atherosclerotic heart disease of native coronary artery without angina pectoris: Secondary | ICD-10-CM | POA: Diagnosis not present

## 2019-11-20 DIAGNOSIS — I872 Venous insufficiency (chronic) (peripheral): Secondary | ICD-10-CM | POA: Diagnosis not present

## 2019-11-26 DIAGNOSIS — S62354A Nondisplaced fracture of shaft of fourth metacarpal bone, right hand, initial encounter for closed fracture: Secondary | ICD-10-CM | POA: Diagnosis not present

## 2019-11-26 DIAGNOSIS — S52531D Colles' fracture of right radius, subsequent encounter for closed fracture with routine healing: Secondary | ICD-10-CM | POA: Diagnosis not present

## 2019-11-27 DIAGNOSIS — S81812D Laceration without foreign body, left lower leg, subsequent encounter: Secondary | ICD-10-CM | POA: Diagnosis not present

## 2019-11-27 DIAGNOSIS — I509 Heart failure, unspecified: Secondary | ICD-10-CM | POA: Diagnosis not present

## 2019-11-27 DIAGNOSIS — I4891 Unspecified atrial fibrillation: Secondary | ICD-10-CM | POA: Diagnosis not present

## 2019-11-27 DIAGNOSIS — I872 Venous insufficiency (chronic) (peripheral): Secondary | ICD-10-CM | POA: Diagnosis not present

## 2019-11-27 DIAGNOSIS — N189 Chronic kidney disease, unspecified: Secondary | ICD-10-CM | POA: Diagnosis not present

## 2019-11-27 DIAGNOSIS — I2510 Atherosclerotic heart disease of native coronary artery without angina pectoris: Secondary | ICD-10-CM | POA: Diagnosis not present

## 2019-11-28 DIAGNOSIS — I509 Heart failure, unspecified: Secondary | ICD-10-CM | POA: Diagnosis not present

## 2019-11-28 DIAGNOSIS — N189 Chronic kidney disease, unspecified: Secondary | ICD-10-CM | POA: Diagnosis not present

## 2019-11-28 DIAGNOSIS — I2510 Atherosclerotic heart disease of native coronary artery without angina pectoris: Secondary | ICD-10-CM | POA: Diagnosis not present

## 2019-11-28 DIAGNOSIS — I872 Venous insufficiency (chronic) (peripheral): Secondary | ICD-10-CM | POA: Diagnosis not present

## 2019-11-28 DIAGNOSIS — S81812D Laceration without foreign body, left lower leg, subsequent encounter: Secondary | ICD-10-CM | POA: Diagnosis not present

## 2019-11-28 DIAGNOSIS — I4891 Unspecified atrial fibrillation: Secondary | ICD-10-CM | POA: Diagnosis not present

## 2019-11-29 DIAGNOSIS — I4891 Unspecified atrial fibrillation: Secondary | ICD-10-CM | POA: Diagnosis not present

## 2019-11-29 DIAGNOSIS — I509 Heart failure, unspecified: Secondary | ICD-10-CM | POA: Diagnosis not present

## 2019-11-29 DIAGNOSIS — S81812D Laceration without foreign body, left lower leg, subsequent encounter: Secondary | ICD-10-CM | POA: Diagnosis not present

## 2019-11-29 DIAGNOSIS — I872 Venous insufficiency (chronic) (peripheral): Secondary | ICD-10-CM | POA: Diagnosis not present

## 2019-11-29 DIAGNOSIS — N189 Chronic kidney disease, unspecified: Secondary | ICD-10-CM | POA: Diagnosis not present

## 2019-11-29 DIAGNOSIS — I2510 Atherosclerotic heart disease of native coronary artery without angina pectoris: Secondary | ICD-10-CM | POA: Diagnosis not present

## 2019-12-02 ENCOUNTER — Telehealth: Payer: Self-pay | Admitting: Interventional Cardiology

## 2019-12-02 DIAGNOSIS — I4891 Unspecified atrial fibrillation: Secondary | ICD-10-CM | POA: Diagnosis not present

## 2019-12-02 DIAGNOSIS — I509 Heart failure, unspecified: Secondary | ICD-10-CM | POA: Diagnosis not present

## 2019-12-02 DIAGNOSIS — I872 Venous insufficiency (chronic) (peripheral): Secondary | ICD-10-CM | POA: Diagnosis not present

## 2019-12-02 DIAGNOSIS — S81812D Laceration without foreign body, left lower leg, subsequent encounter: Secondary | ICD-10-CM | POA: Diagnosis not present

## 2019-12-02 DIAGNOSIS — I2510 Atherosclerotic heart disease of native coronary artery without angina pectoris: Secondary | ICD-10-CM | POA: Diagnosis not present

## 2019-12-02 DIAGNOSIS — N189 Chronic kidney disease, unspecified: Secondary | ICD-10-CM | POA: Diagnosis not present

## 2019-12-02 NOTE — Telephone Encounter (Signed)
Advised Dr. Radford Pax, DOD, recommends taking Lasix 40 mg today w/ APP OV tomorrow for evaluation/lab work Artist). Pt agreeable to plan and voices she can make it tomorrow morning at 8:15 am.  Pt scheduled to see Kathyrn Drown, NP.

## 2019-12-02 NOTE — Telephone Encounter (Signed)
[facility]PT calling in to report:     Pt gained 5 pds since Friday.    BLEE 2+ pitting edema, BLEE weeping, abd distention  SOB  BP 140/88, HR 110-120  Pt did eat Outback meal yesterday, but typically adhere to low salt/health diet  Pt took PRN Lasix on Friday & yesterday (states she "only have one kidney")  Pt denies CP, dizziness Will discuss w/ DOD

## 2019-12-02 NOTE — Progress Notes (Addendum)
Cardiology Office Note  Date:  12/04/2019   ID:  [name], DOB 1923/04/18, MRN [id number]  PCP:  McDiarmid, Blane Ohara, MD  Cardiologist:  Dr. Tamala Stewart  _____________  Weight gain  _____________   History of Present Illness: [name] is a 86 y.o. female with pmh of right coronary stent, persistent afib, symptomatic sinus node dysfunction, COPD, tachybrady syndrome s/p PPM insertion,chronic diastolic CHF, HTN, CKD stage 4 s/p nephrectomyand chronic shortness of breath who presents for weight gain. Previously on amiodarone but this was stopped. she was on chronic anticoagulation with Eliquis however this was stopped after a fall. She is rate controlled with Toprol.  Takes lasix as needed for swelling. In the past acute CHF has been related to afib. Last echo 08/2019 showed LVEF 50-55%, severely dilated LA, mild to mod MR, mod TR, mild AS.    The patient was seen by Dr. Tamala Stewart 08/26/19 for Lower leg swelling who thought patient appeared euvolemic and lasix PRN was continued. Did not want to increase it for kidney insufficiency. She was wrapping her leg which seemed to improve swelling.   She saw Dr. Lovena Stewart 10/18/19 and was symptomatically stable. He recommended rate control with Toprol. Not a candidate for DCCV.   The patient called yesterday to report 5lbs weight gain since Friday with edema and SOB, HR 110-120. She had [employer] the day before and took a lasix 20 mg the last 2 days. DOD recommended lasix 40 mg with BMET and patient was scheduled to be seen in the office.   Today, patient reports she took lasix 20mg  for the last 2 days and weight has gone down 5lbs (167lbs at the office). Patient did not want to take lasix 40 mg since she has one kidney and has issues with incontinence. She is chronically sob from COPD. She still has pitting leg edema on exam, which patient feels is up from baseline. She is willing to take 2 more days of lasix 20mg . They just ordered compression stockings.  Patient also elevates her legs regularly. She recently saw her nephrologist who felt patient was stable.  EKG today shows  Afib RVR with HR 111.  BP 130/76 today, needs better rate control of afib. Denies symptoms of palpitations, chest pain,orthopnea, PND, claudication, dizziness, presyncope, syncope, bleeding, or neurologic sequela. The patient is tolerating medications without difficulties however daughter is concerned patient is not taking medications daily. Patient lives with her daughter who helps cares for her. Patient says she takes her medications as prescribed. Daughter will try and monitor this better. Patient also follows with urology.  About a month ago patient had a mechanical fall and broke her Rt wrist, plan to manage conservatively. Has mild edema still on exam.  _____________   Past Medical History:  Diagnosis Date  . Abnormal mammogram, unspecified 08/23/2010   Followup imaging reassuring.   . Acute appendicitis with rupture   . Acute on chronic diastolic heart failure ([place]) 10/16/2017  . ADENOMATOUS COLONIC POLYP 03/01/2003   Qualifier: Diagnosis of  By: McDiarmid MD, Sherren Mocha Multiple benign polyps of cecum, ascending, transverse and sigmoid colon by 8/09 colonoscopy by Dr [name]  Colonoscopy by Dr [name] for iron-deficiency anemia on 04/27/2010 showed three sessile polyps that were in ascending (3 mm x 9 mm), transverse (4 mm), and cecum (3 mm).  All three were tubular adenomas that were negative for high grade dysplasia or malignancy on pathology. Dr [name] called the polpys benign and not requiring follow-up in  view of the patients age.    . Adrenal adenoma    Incidentaloma  . AF (paroxysmal atrial fibrillation) ([place]) 11/11/2010   Hospitalization (9/8-9/10, Dr [name], Cardiology) for Paroxysmal Atrial Fibrillation with RVR and anginal pain secondary to demand/supply mismatch in setting of RVR with known circumflex artery branch disease.    Marland Kitchen AKI (acute kidney injury) ([place])    . ANXIETY 04/27/2006   Qualifier: Diagnosis of  By: McDiarmid MD, Sherren Mocha    . Benign essential tremor 02/04/2016  . Candidiasis of the esophagus 11/26/2007  . CAP (community acquired pneumonia) 02/25/2015  . Cataract 2013   Bilateral   . Cellulitis of leg, right 10/01/2012  . Cellulitis of right lower extremity   . Chest pain with moderate risk of acute coronary syndrome 05/21/2015  . Chronic kidney disease (CKD), stage Stewart (moderate) (HCC)   . Concussion with loss of consciousness   . COPD 04/27/2006   Qualifier: Diagnosis of  By: McDiarmid MD, Sherren Mocha    . COPD exacerbation ([place])   . COPD, severe   . Decreased functional mobility and endurance 03/17/2015  . Dementia [facility]), possible Alzhemier's type 10/26/2012   Montreal Cognitive Assessment (25 out of 30, points off in visuospatial and and short-term memory).  Independent in iADLs and ADLs.    . Diastolic heart failure ([place]) 07/30/2015  . DISC WITH RADICULOPATHY 04/27/2006   Qualifier: Diagnosis of  By: McDiarmid MD, Sherren Mocha    . Dyspnea   . EDEMA-LEGS,DUE TO VENOUS OBSTRUCT. 04/27/2006   Qualifier: Diagnosis of  By: McDiarmid MD, Sherren Mocha    . Fall 10/27/2016  . Fracture of 5th metatarsal, Right, avulsion fx of tip styloid 11/09/2017  . GERD (gastroesophageal reflux disease) 11/08/2015  . 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 Normal uric acid serum level at time of attack.    . Hand trauma, right, initial encounter 03/20/2018  . HERNIA, HIATAL, NONCONGENITAL 04/27/2006   Qualifier: Diagnosis of  By: McDiarmid MD, Sherren Mocha    . High risk medications (not anticoagulants) long-term use 03/05/2012  . History of Hemorrhoids 04/27/2006   Qualifier: Diagnosis of  By: McDiarmid MD, Sherren Mocha    . History of iron deficiency 01/16/2015  . History of Iron deficiency anemia due to chronic GI blood loss 08/05/2010   Dr [name] (GI) has evaluated with EGD, colonoscopy, and video capsular endoscopy in  2011 & 2012.  All have been unrevealing as to an origin of IDA.  OV with Dr [name] (10/28/10) assessment of blood in stool per hemoccult and GER. Hbg 12.1 g/dL, MCV 91.8, Ferritin 30 ng/mL. Patient taking on ferrous sulfate tab daily.   EGD on 03/06/12 by Dr [name] for IDA non-obstructing Schatzki's ring at [place]  . Hx of colonoscopy with polypectomy 04/27/2010   Dr [name] found three  tubular adenomas each less than 10 mm size  . Hypertension   . Hypotension 07/09/2015  . Impaired mobility and ADLs 09/01/2017  . Iron deficiency anemia 08/05/2010   Dr [name] (GI) has evaluated with EGD, colonoscopy, and video capsular endoscopy in 2011 & 2012.  All have been unrevealing as to an origin of IDA.  OV with Dr [name] (10/28/10) assessment of blood in stool per hemoccult and GER. Hbg 12.1 g/dL, MCV 91.8, Ferritin 30 ng/mL. Patient taking on ferrous sulfate tab daily.   EGD on 03/06/12 by Dr [name] for IDA non-obstructing Schatzki's ring at Gastroesophageal junction,  otherwise normal esophagus and stomach.     . Laceration of arm, unspecified laterality, initial encounter 11/03/2017  . Left leg cellulitis   . Left Metatarsal fracture, 5th, avulsion styloid 12/29/2017  . Leg cramps 05/29/2012  . Low back pain   . Lumbar herniated disc    History of HNP L4/5 in 2003  . Macular degeneration, bilateral 10/04/2010   Right eye is wet MD, the other is dry macular degeneration (ARMD). Pt undergoing some form of vascular endothelial growth factor inhibition intraocular therapy.    . Mild cognitive impairment 10/26/2012   (10/25/12) Failed MiniCog screen  . MUSCLE CRAMPS 03/11/2010   Qualifier: Diagnosis of  By: McDiarmid MD, Sherren Mocha    . Muscle spasm of back 09/24/2013  . Myocardial infarct, old   . Nocturia 10/28/2016  . Numbness and tingling in hands 07/21/2011  . Pacemaker    MDT  . Paroxysmal atrial fibrillation (HCC)   . Persistent atrial fibrillation ([place]) 05/31/2014  . PREDIABETES 09/11/2007   Qualifier: Diagnosis of   By: McDiarmid MD, Sherren Mocha    . Retinal hemorrhage of left eye 06/2010  . RHINITIS, ALLERGIC 04/27/2006   Qualifier: Diagnosis of  By: McDiarmid MD, Sherren Mocha    . SCHATZKI'S RING, HX OF 11/26/2007   Qualifier: Diagnosis of  By: McDiarmid MD, Sherren Mocha  An EGD was performed by Dr [name] on 04/27/2010 for iron deficiency anemia. There was a a transient hiatal hernia with Schatzki's ring. Stomach and duodenum were normal. EGD on 03/06/12 by Dr [name] for IDA non-obstructing Schatzki's ring at Gastroesophageal junction, otherwise normal esophagus and stomach.    . Seborrheic keratosis, right anterior thigh 12/12/2013  . Shoulder pain, left 01/09/2014  . SICK SINUS SYNDROME 04/27/2006   S/P dual chamber pacemaker placement by Dr Osie Cheeks (EPS-[facility]) with pacemaker lead extraction & reinsertion - 07/25/2003  Hospitalization (9/8-9/10, Dr [name], Cardiology) for Paroxysmal Atrial Fibrillation with RVR and anginal pain secondary to demand/supply mismatch in setting of RVR with known circumflex artery branch disease.    . Sick sinus syndrome with tachycardia ([place])    MDT  . Soft tissue injury of foot 05/03/2011  . Solar lentigo 06/15/2012  . Solitary kidney, acquired 05/17/2010   Surgical removal for transitional cell cancer by Tresa Endo, MD ([facility]). Surveillance cystoscopy by Dr Alinda Money [facility]) on 10/19/12 without evidence of cystoscopic recurrence. Recommend RTC one year for cystoscopy.   Marland Kitchen Spinal stenosis, lumbar   . Transitional cell carcinoma of ureter, history   . Traumatic ecchymosis of left foot 12/07/2017  . Trigger point with neck pain 10/26/2018  . Urge incontinence 12/13/2011   Diagnosed in 10/2011 by Dr Bjorn Loser (Urology)   . Venous stasis ulcer of ankle, left ([place]) 10/24/2014  . VENTRICULAR HYPERTROPHY, LEFT 08/28/2008   Qualifier: Diagnosis of  By: McDiarmid MD, Sherren Mocha    . VITAMIN B12 DEFICIENCY 10/07/2009   Qualifier: Diagnosis of  By: McDiarmid MD, Sherren Mocha  Dx based on a post-TKR  anemia work-up Low normal serum B12 with high Methylmalonic acid and homocysteine level  Vit B12 serum level (10/28/10) > 1500 pg/mL   . Vitamin D deficiency 11/02/2010   Serum vitamin D 25(OH) = 10.9 ng/mL (30 -100) on 10/28/10 c/w Vitamin D deficiency.     . Wound of left foot 12/29/2017   Past Surgical History:  Procedure Laterality Date  . BREAST SURGERY     breast reduction  . CARDIOVERSION N/A 10/20/2017   Procedure: CARDIOVERSION;  Surgeon: Jenkins Rouge  C, MD;  Location: [place];  Service: Cardiovascular;  Laterality: N/A;  . CHOLECYSTECTOMY    . CORONARY ANGIOPLASTY WITH STENT PLACEMENT  2010   BMS RCA, OM2 occluded  . CYSTOSCOPY  09/30/2015   No cystoscopic evidence of uroepithelial neoplasm.  Follow up surveillance cystoscopy in one year  . CYSTOSCOPY  10/13/2017   Dr Raynelle Bring [facility])  . DUPUYTREN CONTRACTURE RELEASE Right 05/22/2014   Procedure: DUPUYTREN RELEASE AND REPAIR AS NECESSARY RIGHT RING FINGER AND MIDDLE FINGER;  Surgeon: Roseanne Kaufman, MD;  Location: [place];  Service: Orthopedics;  Laterality: Right;  . ESOPHAGOGASTRODUODENOSCOPY  04/27/2010   Dr [name] - found transient H/H & Schatzki's ring  . ESOPHAGOGASTRODUODENOSCOPY ENDOSCOPY  03/06/2012   Dr [name] - found non-obstuctive Schatzki's ring at [employer] o/w normal EGD.   Marland Kitchen EYE SURGERY     bilateral cataracts  . KNEE ARTHROSCOPY W/ SYNOVECTOMY  11/2009, left knee   Dr Wynelle Link  . NEPHRECTOMY  For transition cell cancer    Dr Tresa Endo, surgeon  . PACEMAKER GENERATOR CHANGE N/A 11/12/2012   Procedure: PACEMAKER GENERATOR CHANGE;  Surgeon: Evans Lance, MD; [employer] [facility] dual-chamber pacemaker serial number [id number]; Laterality: Right  . PACEMAKER INSERTION  2005   Dr Doreatha Lew  . PACEMAKER LEAD REMOVAL  2005   Removal and reinsertion of atrial and ventricular leads due to migration  . REPLACEMENT TOTAL KNEE  2009, Right knee   Dr Wynelle Link  . REPLACEMENT TOTAL KNEE  05/2009, Left knee   Dr  Wynelle Link  . TONSILLECTOMY    . TRIGGER FINGER RELEASE Right 05/22/2014   Procedure: RIGHT HAND A-1 PULLEY RELEASE ;  Surgeon: Roseanne Kaufman, MD;  Location: [place];  Service: Orthopedics;  Laterality: Right;   _____________  Current Outpatient Medications  Medication Sig Dispense Refill  . albuterol (PROVENTIL) (2.5 MG/3ML) 0.083% nebulizer solution USE 1 VIAL VIA NEBULIZER EVERY 4 HOURS AS NEEDED FOR WHEEZING OR SHORTNESS OF BREATH 360 mL 5  . albuterol (VENTOLIN HFA) 108 (90 Base) MCG/ACT inhaler Inhale 2 puffs into the lungs every 4 (four) hours as needed for wheezing or shortness of breath. 18 g 5  . atorvastatin (LIPITOR) 20 MG tablet TAKE 1 TABLET BY MOUTH DAILY 90 tablet 3  . ELIQUIS 2.5 MG TABS tablet TAKE 1 TABLET(2.5 MG) BY MOUTH TWICE DAILY 60 tablet 5  . fluticasone (FLONASE) 50 MCG/ACT nasal spray SHAKE LIQUID AND USE 2 SPRAYS IN EACH NOSTRIL DAILY 48 g 3  . Fluticasone-Umeclidin-Vilant (TRELEGY ELLIPTA) 100-62.5-25 MCG/INH AEPB Inhale 1 puff into the lungs daily. 60 each 5  . furosemide (LASIX) 40 MG tablet TAKE 1/2 TABLET(20 MG) BY MOUTH DAILY AS NEEDED FOR FLUID RETENTION OR SWELLING 45 tablet 1  . HYDROcodone-acetaminophen (NORCO/VICODIN) 5-325 MG tablet hydrocodone 5 mg-acetaminophen 325 mg tablet    . nitroGLYCERIN (NITROSTAT) 0.4 MG SL tablet Place 1 tablet (0.4 mg total) under the tongue every 5 (five) minutes as needed for chest pain (x 3 tabs). 25 tablet PRN  . omeprazole (PRILOSEC) 20 MG capsule TAKE 1 CAPSULE(20 MG) BY MOUTH DAILY 90 capsule 3  . Spacer/Aero-Holding Chambers (AEROCHAMBER MV) inhaler Use as instructed 1 each 0  . traMADol (ULTRAM) 50 MG tablet TAKE 1 TABLET BY MOUTH EVERY 6 HOURS AS NEEDED FOR PAIN 30 tablet 2  . metoprolol succinate (TOPROL XL) 25 MG 24 hr tablet Take 1.5 tablets (37.5 mg total) by mouth daily. 135 tablet 3   No current facility-administered medications for this visit.  _____________   Allergies:   Baclofen, Codeine phosphate,  Hydrochlorothiazide, Montelukast sodium, and Norco [hydrocodone-acetaminophen]  _____________   Social History:  The patient  reports that she quit smoking about 10 years ago. Her smoking use included cigarettes. She smoked 1.00 pack per day. She has never used smokeless tobacco. She reports current alcohol use of about 13.0 standard drinks of alcohol per week. She reports that she does not use drugs.  _____________   Family History:  The patient's family history includes Dementia in her mother; Heart attack in her father; Heart disease in her father.  _____________   ROS:  Please see the history of present illness.   Positive for weight gain, lower leg edema,   All other systems are reviewed and negative.  _____________   PHYSICAL EXAM: VS:  BP 130/76   Pulse (!) 58   Ht 5\' 5"  (1.651 m)   Wt 167 lb 9.6 oz (76 kg)   SpO2 95%   BMI 27.89 kg/m  , BMI Body mass index is 27.89 kg/m. GEN: Well nourished, well developed, in no acute distress  HEENT: normal  Neck: no JVD, carotid bruits, or masses Cardiac: Irreg Irreg, tachycardia; no murmurs, rubs, or gallops. No clubbing, cyanosis, +1-2b/l edema.  Radials/DP/PT 2+ and equal bilaterally.  Respiratory:  Minimal inspiratory whezzing GI: soft, nontender, nondistended, + BS MS: no deformity or atrophy  Skin: warm and dry, no rash Neuro:  Strength and sensation are intact Psych: euthymic mood, full affect _____________  EKG:   The ekg ordered today shows Afib RVR 111bpm  Recent Labs: 07/02/2019: TSH 3.110 10/03/2019: ALT 13; Hemoglobin 11.2; Platelets 246 12/03/2019: BUN 34; Creatinine, Ser 1.97; Potassium 4.3; Sodium 142  No results found for requested labs within last 8760 hours.  Estimated Creatinine Clearance: 18.2 mL/min (A) (by C-G formula based on SCr of 1.97 mg/dL (H)).  Wt Readings from Last 3 Encounters:  12/03/19 167 lb 9.6 oz (76 kg)  10/18/19 170 lb 12.8 oz (77.5 kg)  10/03/19 168 lb (76.2 kg)    _____________   The  following studies were reviewed today: 2D Doppler echocardiogram June 2021: IMPRESSIONS    1. Left ventricular ejection fraction, by estimation, is 50 to 55%. The  left ventricle has low normal function. The left ventricle has no regional  wall motion abnormalities. There is mild left ventricular hypertrophy of  the basal-septal segment. Left  ventricular diastolic parameters are indeterminate.  2. Right ventricular systolic function is normal. The right ventricular  size is normal. There is moderately elevated pulmonary artery systolic  pressure. The estimated right ventricular systolic pressure is 17.4 mmHg.  3. Left atrial size was severely dilated.  4. The mitral valve is normal in structure. Mild to moderate mitral valve  regurgitation. No evidence of mitral stenosis.  5. Tricuspid valve regurgitation is moderate.  6. The aortic valve is tricuspid. Aortic valve regurgitation is not  visualized. Mild aortic valve sclerosis is present, with no evidence of  aortic valve stenosis.  7. The inferior vena cava is normal in size with greater than 50%  respiratory variability, suggesting right atrial pressure of 3 mmHg.  8. Compared to prior echo, PASP has increased from 18 to 3mmHg.    ASSESSMENT AND PLAN:  Chronic diastolic CHF - Patient takes lasix 20 mg as needed for extra volume.  Patient has 1 kidney and some incontinence and does not want to take higher doses of lasix - Patient called yesterday to report 5lbs weight gain  took lasix 20mg  for 2 days and weight is back down 5lbs however still has LLE on exam. Also in afib RVR with HR 111. BP stable -- Echo 07/2019 with LVEF 50-55%, mild LVH, severely dilated LA, mild to mod MR, mod TR - Recommend taking lasix 20mg  for 2 more days. Continue with daily weights and low salt diet. Daughter ordered compression stockings.  - Check BMET today. Increase Toprol to 37.3mg  daily for better rate control. Will bring patient back in a week  to evaluate volume status, BP, and kidney function.  Persistent Afib - Amiodarone stopped earlier this year - Not on a/c due to fall - not DCCV candidate with severely dilated LA - EKG today shows Afib RVR, 111bpm - BP 130/76. Plan to increase Toprol to 37.5mg  daily. Recommended daily vitals. Daughter will better monitor medication compliance.   CAD - denies anginal symptoms  S/p PPM - normal functioning device  CKD stage 4 s/p nephrectomy - BMET today - follows with nephrologist  COPD - patient has chronic sob that is unchanged    Disposition:   FU with APP in 1 week   Signed, Laquinton Bihm Ninfa Meeker, PA-C 12/04/2019 8:19 AM    _____________ [facility] [address] [place] [address]  [phone] (office) [phone] (fax)

## 2019-12-02 NOTE — Telephone Encounter (Signed)
Pt c/o swelling: STAT is pt has developed SOB within 24 hours  1) How much weight have you gained and in what time span? 165 to 170 in a couple of days   2) If swelling, where is the swelling located? Legs, feet and stomach   Are you currently taking a fluid pill? Taking furosemide (LASIX) 40 MG tablet [[id number]]  3)   4) Are you currently SOB?  Yes   5) Do you have a log of your daily weights (if so, list)? 165 on Friday and 170 today   6) Have you gained 3 pounds in a day or 5 pounds in a week? Yes   7) Have you traveled recently? No   BP - 140/88 Pulse 120  [facility] with [facility] called to report

## 2019-12-03 ENCOUNTER — Encounter: Payer: Self-pay | Admitting: *Deleted

## 2019-12-03 ENCOUNTER — Ambulatory Visit (INDEPENDENT_AMBULATORY_CARE_PROVIDER_SITE_OTHER): Payer: Medicare Other | Admitting: Medical

## 2019-12-03 ENCOUNTER — Other Ambulatory Visit: Payer: Self-pay

## 2019-12-03 ENCOUNTER — Encounter: Payer: Self-pay | Admitting: Cardiology

## 2019-12-03 VITALS — BP 130/76 | HR 58 | Ht 65.0 in | Wt 167.6 lb

## 2019-12-03 DIAGNOSIS — I2510 Atherosclerotic heart disease of native coronary artery without angina pectoris: Secondary | ICD-10-CM | POA: Diagnosis not present

## 2019-12-03 DIAGNOSIS — Z950 Presence of cardiac pacemaker: Secondary | ICD-10-CM | POA: Diagnosis not present

## 2019-12-03 DIAGNOSIS — I5032 Chronic diastolic (congestive) heart failure: Secondary | ICD-10-CM | POA: Diagnosis not present

## 2019-12-03 DIAGNOSIS — I4819 Other persistent atrial fibrillation: Secondary | ICD-10-CM | POA: Diagnosis not present

## 2019-12-03 LAB — BASIC METABOLIC PANEL
BUN/Creatinine Ratio: 17 (ref 12–28)
BUN: 34 mg/dL (ref 10–36)
CO2: 25 mmol/L (ref 20–29)
Calcium: 8.8 mg/dL (ref 8.7–10.3)
Chloride: 103 mmol/L (ref 96–106)
Creatinine, Ser: 1.97 mg/dL — ABNORMAL HIGH (ref 0.57–1.00)
GFR calc Af Amer: 25 mL/min/{1.73_m2} — ABNORMAL LOW (ref >59)
GFR calc non Af Amer: 21 mL/min/{1.73_m2} — ABNORMAL LOW (ref >59)
Glucose: 101 mg/dL — ABNORMAL HIGH (ref 65–99)
Potassium: 4.3 mmol/L (ref 3.5–5.2)
Sodium: 142 mmol/L (ref 134–144)

## 2019-12-03 MED ORDER — METOPROLOL SUCCINATE ER 25 MG PO TB24
37.5000 mg | ORAL_TABLET | Freq: Every day | ORAL | 3 refills | Status: DC
Start: 2019-12-03 — End: 2020-02-01

## 2019-12-03 NOTE — Patient Instructions (Signed)
Medication Instructions:  1. Increase your metoprolol succinate to 37.5 mg by mouth daily. You will take one and one half tablets by mouth daily of the 25 mg tablet. The prescription has been updated at your pharmacy.   *If you need a refill on your cardiac medications before your next appointment, please call your pharmacy*   Lab Work: BMET today and repeat BMET in one week.   If you have labs (blood work) drawn today and your tests are completely normal, you will receive your results only by: Marland Kitchen MyChart Message (if you have MyChart) OR . A paper copy in the mail If you have any lab test that is abnormal or we need to change your treatment, we will call you to review the results.   Testing/Procedures: None   Follow-Up: At [facility], you and your health needs are our priority.  As part of our continuing mission to provide you with exceptional heart care, we have created designated Provider Care Teams.  These Care Teams include your primary Cardiologist (physician) and Advanced Practice Providers (APPs -  Physician Assistants and Nurse Practitioners) who all work together to provide you with the care you need, when you need it.  We recommend signing up for the patient portal called "MyChart".  Sign up information is provided on this After Visit Summary.  MyChart is used to connect with patients for Virtual Visits (Telemedicine).  Patients are able to view lab/test results, encounter notes, upcoming appointments, etc.  Non-urgent messages can be sent to your provider as well.   To learn more about what you can do with MyChart, go to [email or web address].    Your next appointment:   1 week(s)  The format for your next appointment:   In Person  Provider:   You may see Sinclair Grooms, MD or one of the following Advanced Practice Providers on your designated Care Team:    Truitt Merle, NP  Cecilie Kicks, NP  Kathyrn Drown, NP

## 2019-12-04 ENCOUNTER — Telehealth: Payer: Self-pay | Admitting: Interventional Cardiology

## 2019-12-04 DIAGNOSIS — I4891 Unspecified atrial fibrillation: Secondary | ICD-10-CM | POA: Diagnosis not present

## 2019-12-04 DIAGNOSIS — Z7951 Long term (current) use of inhaled steroids: Secondary | ICD-10-CM | POA: Diagnosis not present

## 2019-12-04 DIAGNOSIS — Z7901 Long term (current) use of anticoagulants: Secondary | ICD-10-CM | POA: Diagnosis not present

## 2019-12-04 DIAGNOSIS — S81812D Laceration without foreign body, left lower leg, subsequent encounter: Secondary | ICD-10-CM | POA: Diagnosis not present

## 2019-12-04 DIAGNOSIS — Z79891 Long term (current) use of opiate analgesic: Secondary | ICD-10-CM | POA: Diagnosis not present

## 2019-12-04 DIAGNOSIS — Z905 Acquired absence of kidney: Secondary | ICD-10-CM | POA: Diagnosis not present

## 2019-12-04 DIAGNOSIS — N189 Chronic kidney disease, unspecified: Secondary | ICD-10-CM | POA: Diagnosis not present

## 2019-12-04 DIAGNOSIS — Z4800 Encounter for change or removal of nonsurgical wound dressing: Secondary | ICD-10-CM | POA: Diagnosis not present

## 2019-12-04 DIAGNOSIS — I2510 Atherosclerotic heart disease of native coronary artery without angina pectoris: Secondary | ICD-10-CM | POA: Diagnosis not present

## 2019-12-04 DIAGNOSIS — I872 Venous insufficiency (chronic) (peripheral): Secondary | ICD-10-CM | POA: Diagnosis not present

## 2019-12-04 DIAGNOSIS — Z9181 History of falling: Secondary | ICD-10-CM | POA: Diagnosis not present

## 2019-12-04 DIAGNOSIS — I509 Heart failure, unspecified: Secondary | ICD-10-CM | POA: Diagnosis not present

## 2019-12-04 NOTE — Telephone Encounter (Signed)
Reviewed results with patient who verbalized understanding.   Confirmed visit next week. She was grateful for assistance.

## 2019-12-04 NOTE — Telephone Encounter (Signed)
-----   Message from Regino Ramirez, PA-C sent at 12/04/2019  8:18 AM EDT ----- Please call patient and inform her kidney function is stable. Keep scheduled appointment.

## 2019-12-04 NOTE — Telephone Encounter (Signed)
Patient is returning call to discuss results from lab work completed on 12/03/19.

## 2019-12-06 DIAGNOSIS — Z905 Acquired absence of kidney: Secondary | ICD-10-CM | POA: Diagnosis not present

## 2019-12-06 DIAGNOSIS — N189 Chronic kidney disease, unspecified: Secondary | ICD-10-CM | POA: Diagnosis not present

## 2019-12-06 DIAGNOSIS — I2510 Atherosclerotic heart disease of native coronary artery without angina pectoris: Secondary | ICD-10-CM | POA: Diagnosis not present

## 2019-12-06 DIAGNOSIS — Z7951 Long term (current) use of inhaled steroids: Secondary | ICD-10-CM | POA: Diagnosis not present

## 2019-12-06 DIAGNOSIS — I4891 Unspecified atrial fibrillation: Secondary | ICD-10-CM | POA: Diagnosis not present

## 2019-12-06 DIAGNOSIS — I509 Heart failure, unspecified: Secondary | ICD-10-CM | POA: Diagnosis not present

## 2019-12-09 DIAGNOSIS — I4891 Unspecified atrial fibrillation: Secondary | ICD-10-CM | POA: Diagnosis not present

## 2019-12-09 DIAGNOSIS — Z905 Acquired absence of kidney: Secondary | ICD-10-CM | POA: Diagnosis not present

## 2019-12-09 DIAGNOSIS — I2510 Atherosclerotic heart disease of native coronary artery without angina pectoris: Secondary | ICD-10-CM | POA: Diagnosis not present

## 2019-12-09 DIAGNOSIS — I509 Heart failure, unspecified: Secondary | ICD-10-CM | POA: Diagnosis not present

## 2019-12-09 DIAGNOSIS — Z7951 Long term (current) use of inhaled steroids: Secondary | ICD-10-CM | POA: Diagnosis not present

## 2019-12-09 DIAGNOSIS — N189 Chronic kidney disease, unspecified: Secondary | ICD-10-CM | POA: Diagnosis not present

## 2019-12-11 DIAGNOSIS — Z905 Acquired absence of kidney: Secondary | ICD-10-CM | POA: Diagnosis not present

## 2019-12-11 DIAGNOSIS — I509 Heart failure, unspecified: Secondary | ICD-10-CM | POA: Diagnosis not present

## 2019-12-11 DIAGNOSIS — Z7951 Long term (current) use of inhaled steroids: Secondary | ICD-10-CM | POA: Diagnosis not present

## 2019-12-11 DIAGNOSIS — I4891 Unspecified atrial fibrillation: Secondary | ICD-10-CM | POA: Diagnosis not present

## 2019-12-11 DIAGNOSIS — N189 Chronic kidney disease, unspecified: Secondary | ICD-10-CM | POA: Diagnosis not present

## 2019-12-11 DIAGNOSIS — I2510 Atherosclerotic heart disease of native coronary artery without angina pectoris: Secondary | ICD-10-CM | POA: Diagnosis not present

## 2019-12-11 NOTE — Progress Notes (Signed)
Cardiology Office Note   Date:  12/12/2019   ID:  [name], DOB 1923-06-02, MRN [id number]  PCP:  McDiarmid, Blane Ohara, MD  Cardiologist:  Dr. [name]    Chief Complaint  Patient presents with  . Leg Swelling      History of Present Illness: [name] is a 86 y.o. female who presents for hx of right coronary stent, persistent afib, symptomatic sinus node dysfunction, COPD, tachybrady syndrome s/p PPM insertion,chronic diastolic CHF, HTN, CKD stage 4 s/p nephrectomyand chronic shortness of breath who presents for weight gain. Previously on amiodarone but this was stopped. she was on chronic anticoagulation with Eliquis however this was stopped after a fall. She is rate controlled with Toprol.  Takes lasix as needed for swelling. In the past acute CHF has been related to afib. Last echo 08/2019 showed LVEF 50-55%, severely dilated LA, mild to mod MR, mod TR, mild AS.    The patient was seen by Dr. [name] 08/26/19 for Lower leg swelling who thought patient appeared euvolemic and lasix PRN was continued. Did not want to increase it for kidney insufficiency. She was wrapping her leg which seemed to improve swelling. Hx of single kidney.  She saw Dr. Lovena Le 10/18/19 and was symptomatically stable. He recommended rate control with Toprol. Not a candidate for DCCV.     Last visit with wt gain and SOB.  .   The patient called yesterday to report 5lbs weight gain since Friday with edema and SOB, HR 110-120. She had [employer] the day before and took a lasix 20 mg the last 2 days. DOD recommended lasix 40 mg with BMET and patient was scheduled to be seen in the office.   12/03/19  patient reports she took lasix 20mg  for the last 2 days prior to appt and weight has gone down 5lbs (167lbs at the office). Patient did not want to take lasix 40 mg since she has one kidney and has issues with incontinence. She is chronically sob from COPD. She was in her Afib RVR with HR 111.  her BB was  increased.    Pt's daughter is staying with her for now so she is not alone.  We discussed how she should stay on 1st floor, she has fallen.  Discussed [facility].  She is on eliquis, other notes stated it was stopped.  She is only taking once per day.  No bleeding.   Past Medical History:  Diagnosis Date  . Abnormal mammogram, unspecified 08/23/2010   Followup imaging reassuring.   . Acute appendicitis with rupture   . Acute on chronic diastolic heart failure ([place]) 10/16/2017  . ADENOMATOUS COLONIC POLYP 03/01/2003   Qualifier: Diagnosis of  By: McDiarmid MD, Sherren Mocha Multiple benign polyps of cecum, ascending, transverse and sigmoid colon by 8/09 colonoscopy by Dr Cristina Gong  Colonoscopy by Dr Cristina Gong for iron-deficiency anemia on 04/27/2010 showed three sessile polyps that were in ascending (3 mm x 9 mm), transverse (4 mm), and cecum (3 mm).  All three were tubular adenomas that were negative for high grade dysplasia or malignancy on pathology. Dr Cristina Gong called the polpys benign and not requiring follow-up in view of the patients age.    . Adrenal adenoma    Incidentaloma  . AF (paroxysmal atrial fibrillation) ([place]) 11/11/2010   Hospitalization (9/8-9/10, Dr Daneen Schick, III, Cardiology) for Paroxysmal Atrial Fibrillation with RVR and anginal pain secondary to demand/supply mismatch in setting of RVR with known circumflex artery branch disease.    Marland Kitchen  AKI (acute kidney injury) ([place])   . ANXIETY 04/27/2006   Qualifier: Diagnosis of  By: McDiarmid MD, Sherren Mocha    . Benign essential tremor 02/04/2016  . Candidiasis of the esophagus 11/26/2007  . CAP (community acquired pneumonia) 02/25/2015  . Cataract 2013   Bilateral   . Cellulitis of leg, right 10/01/2012  . Cellulitis of right lower extremity   . Chest pain with moderate risk of acute coronary syndrome 05/21/2015  . Chronic kidney disease (CKD), stage III (moderate) (HCC)   . Concussion with loss of consciousness   . COPD 04/27/2006   Qualifier: Diagnosis  of  By: McDiarmid MD, Sherren Mocha    . COPD exacerbation ([place])   . COPD, severe   . Decreased functional mobility and endurance 03/17/2015  . Dementia [facility]), possible Alzhemier's type 10/26/2012   Montreal Cognitive Assessment (25 out of 30, points off in visuospatial and and short-term memory).  Independent in iADLs and ADLs.    . Diastolic heart failure ([place]) 07/30/2015  . DISC WITH RADICULOPATHY 04/27/2006   Qualifier: Diagnosis of  By: McDiarmid MD, Sherren Mocha    . Dyspnea   . EDEMA-LEGS,DUE TO VENOUS OBSTRUCT. 04/27/2006   Qualifier: Diagnosis of  By: McDiarmid MD, Sherren Mocha    . Fall 10/27/2016  . Fracture of 5th metatarsal, Right, avulsion fx of tip styloid 11/09/2017  . GERD (gastroesophageal reflux disease) 11/08/2015  . 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 Normal uric acid serum level at time of attack.    . Hand trauma, right, initial encounter 03/20/2018  . HERNIA, HIATAL, NONCONGENITAL 04/27/2006   Qualifier: Diagnosis of  By: McDiarmid MD, Sherren Mocha    . High risk medications (not anticoagulants) long-term use 03/05/2012  . History of Hemorrhoids 04/27/2006   Qualifier: Diagnosis of  By: McDiarmid MD, Sherren Mocha    . History of iron deficiency 01/16/2015  . History of Iron deficiency anemia due to chronic GI blood loss 08/05/2010   Dr Cristina Gong (GI) has evaluated with EGD, colonoscopy, and video capsular endoscopy in 2011 & 2012.  All have been unrevealing as to an origin of IDA.  OV with Dr Cristina Gong (10/28/10) assessment of blood in stool per hemoccult and GER. Hbg 12.1 g/dL, MCV 91.8, Ferritin 30 ng/mL. Patient taking on ferrous sulfate tab daily.   EGD on 03/06/12 by Dr Cristina Gong for IDA non-obstructing Schatzki's ring at [place]  . Hx of colonoscopy with polypectomy 04/27/2010   Dr Cristina Gong found three  tubular adenomas each less than 10 mm size  . Hypertension   . Hypotension 07/09/2015  . Impaired mobility and ADLs 09/01/2017  . Iron  deficiency anemia 08/05/2010   Dr Cristina Gong (GI) has evaluated with EGD, colonoscopy, and video capsular endoscopy in 2011 & 2012.  All have been unrevealing as to an origin of IDA.  OV with Dr Cristina Gong (10/28/10) assessment of blood in stool per hemoccult and GER. Hbg 12.1 g/dL, MCV 91.8, Ferritin 30 ng/mL. Patient taking on ferrous sulfate tab daily.   EGD on 03/06/12 by Dr Cristina Gong for IDA non-obstructing Schatzki's ring at Gastroesophageal junction, otherwise normal esophagus and stomach.     . Laceration of arm, unspecified laterality, initial encounter 11/03/2017  . Left leg cellulitis   . Left Metatarsal fracture, 5th, avulsion styloid 12/29/2017  . Leg cramps 05/29/2012  . Low back pain   . Lumbar herniated disc    History of HNP L4/5 in 2003  .  Macular degeneration, bilateral 10/04/2010   Right eye is wet MD, the other is dry macular degeneration (ARMD). Pt undergoing some form of vascular endothelial growth factor inhibition intraocular therapy.    . Mild cognitive impairment 10/26/2012   (10/25/12) Failed MiniCog screen  . MUSCLE CRAMPS 03/11/2010   Qualifier: Diagnosis of  By: McDiarmid MD, Sherren Mocha    . Muscle spasm of back 09/24/2013  . Myocardial infarct, old   . Nocturia 10/28/2016  . Numbness and tingling in hands 07/21/2011  . Pacemaker    MDT  . Paroxysmal atrial fibrillation (HCC)   . Persistent atrial fibrillation ([place]) 05/31/2014  . PREDIABETES 09/11/2007   Qualifier: Diagnosis of  By: McDiarmid MD, Sherren Mocha    . Retinal hemorrhage of left eye 06/2010  . RHINITIS, ALLERGIC 04/27/2006   Qualifier: Diagnosis of  By: McDiarmid MD, Sherren Mocha    . SCHATZKI'S RING, HX OF 11/26/2007   Qualifier: Diagnosis of  By: McDiarmid MD, Sherren Mocha  An EGD was performed by Dr Cristina Gong on 04/27/2010 for iron deficiency anemia. There was a a transient hiatal hernia with Schatzki's ring. Stomach and duodenum were normal. EGD on 03/06/12 by Dr Cristina Gong for IDA non-obstructing Schatzki's ring at Gastroesophageal junction, otherwise normal  esophagus and stomach.    . Seborrheic keratosis, right anterior thigh 12/12/2013  . Shoulder pain, left 01/09/2014  . SICK SINUS SYNDROME 04/27/2006   S/P dual chamber pacemaker placement by Dr Osie Cheeks (E[facility]) with pacemaker lead extraction & reinsertion - 07/25/2003  Hospitalization (9/8-9/10, Dr Daneen Schick, III, Cardiology) for Paroxysmal Atrial Fibrillation with RVR and anginal pain secondary to demand/supply mismatch in setting of RVR with known circumflex artery branch disease.    . Sick sinus syndrome with tachycardia ([place])    MDT  . Soft tissue injury of foot 05/03/2011  . Solar lentigo 06/15/2012  . Solitary kidney, acquired 05/17/2010   Surgical removal for transitional cell cancer by Tresa Endo, MD ([place]). Surveillance cystoscopy by Dr Alinda Money [facility]) on 10/19/12 without evidence of cystoscopic recurrence. Recommend RTC one year for cystoscopy.   Marland Kitchen Spinal stenosis, lumbar   . Transitional cell carcinoma of ureter, history   . Traumatic ecchymosis of left foot 12/07/2017  . Trigger point with neck pain 10/26/2018  . Urge incontinence 12/13/2011   Diagnosed in 10/2011 by Dr Bjorn Loser (Urology)   . Venous stasis ulcer of ankle, left ([place]) 10/24/2014  . VENTRICULAR HYPERTROPHY, LEFT 08/28/2008   Qualifier: Diagnosis of  By: McDiarmid MD, Sherren Mocha    . VITAMIN B12 DEFICIENCY 10/07/2009   Qualifier: Diagnosis of  By: McDiarmid MD, Sherren Mocha  Dx based on a post-TKR anemia work-up Low normal serum B12 with high Methylmalonic acid and homocysteine level  Vit B12 serum level (10/28/10) > 1500 pg/mL   . Vitamin D deficiency 11/02/2010   Serum vitamin D 25(OH) = 10.9 ng/mL (30 -100) on 10/28/10 c/w Vitamin D deficiency.     . Wound of left foot 12/29/2017    Past Surgical History:  Procedure Laterality Date  . BREAST SURGERY     breast reduction  . CARDIOVERSION N/A 10/20/2017   Procedure: CARDIOVERSION;  Surgeon: Josue Hector, MD;  Location[facility] ENDOSCOPY;  Service:  Cardiovascular;  Laterality: N/A;  . CHOLECYSTECTOMY    . CORONARY ANGIOPLASTY WITH STENT PLACEMENT  2010   BMS RCA, OM2 occluded  . CYSTOSCOPY  09/30/2015   No cystoscopic evidence of uroepithelial neoplasm.  Follow up surveillance cystoscopy in one year  . CYSTOSCOPY  10/13/2017  Dr Raynelle Bring [facility])  . DUPUYTREN CONTRACTURE RELEASE Right 05/22/2014   Procedure: DUPUYTREN RELEASE AND REPAIR AS NECESSARY RIGHT RING FINGER AND MIDDLE FINGER;  Surgeon: Roseanne Kaufman, MD;  Location: [facility];  Service: Orthopedics;  Laterality: Right;  . ESOPHAGOGASTRODUODENOSCOPY  04/27/2010   Dr Cristina Gong - found transient H/H & Schatzki's ring  . ESOPHAGOGASTRODUODENOSCOPY ENDOSCOPY  03/06/2012   Dr Cristina Gong - found non-obstuctive Schatzki's ring at [employer] o/w normal EGD.   Marland Kitchen EYE SURGERY     bilateral cataracts  . KNEE ARTHROSCOPY W/ SYNOVECTOMY  11/2009, left knee   Dr Wynelle Link  . NEPHRECTOMY  For transition cell cancer    Dr Tresa Endo, surgeon  . PACEMAKER GENERATOR CHANGE N/A 11/12/2012   Procedure: PACEMAKER GENERATOR CHANGE;  Surgeon: Evans Lance, MD; [employer] [facility],[employer] dual-chamber pacemaker serial number [id number]; Laterality: Right  . PACEMAKER INSERTION  2005   Dr Doreatha Lew  . PACEMAKER LEAD REMOVAL  2005   Removal and reinsertion of atrial and ventricular leads due to migration  . REPLACEMENT TOTAL KNEE  2009, Right knee   Dr Wynelle Link  . REPLACEMENT TOTAL KNEE  05/2009, Left knee   Dr Wynelle Link  . TONSILLECTOMY    . TRIGGER FINGER RELEASE Right 05/22/2014   Procedure: RIGHT HAND A-1 PULLEY RELEASE ;  Surgeon: Roseanne Kaufman, MD;  Location: [facility];  Service: Orthopedics;  Laterality: Right;     Current Outpatient Medications  Medication Sig Dispense Refill  . albuterol (PROVENTIL) (2.5 MG/3ML) 0.083% nebulizer solution USE 1 VIAL VIA NEBULIZER EVERY 4 HOURS AS NEEDED FOR WHEEZING OR SHORTNESS OF BREATH 360 mL 5  . albuterol (VENTOLIN HFA) 108 (90 Base) MCG/ACT inhaler Inhale 2  puffs into the lungs every 4 (four) hours as needed for wheezing or shortness of breath. 18 g 5  . atorvastatin (LIPITOR) 20 MG tablet TAKE 1 TABLET BY MOUTH DAILY 90 tablet 3  . ELIQUIS 2.5 MG TABS tablet TAKE 1 TABLET(2.5 MG) BY MOUTH TWICE DAILY 60 tablet 5  . fluticasone (FLONASE) 50 MCG/ACT nasal spray SHAKE LIQUID AND USE 2 SPRAYS IN EACH NOSTRIL DAILY 48 g 3  . Fluticasone-Umeclidin-Vilant (TRELEGY ELLIPTA) 100-62.5-25 MCG/INH AEPB Inhale 1 puff into the lungs daily. 60 each 5  . furosemide (LASIX) 40 MG tablet TAKE 1/2 TABLET(20 MG) BY MOUTH DAILY AS NEEDED FOR FLUID RETENTION OR SWELLING 45 tablet 1  . metoprolol succinate (TOPROL XL) 25 MG 24 hr tablet Take 1.5 tablets (37.5 mg total) by mouth daily. 135 tablet 3  . nitroGLYCERIN (NITROSTAT) 0.4 MG SL tablet Place 1 tablet (0.4 mg total) under the tongue every 5 (five) minutes as needed for chest pain (x 3 tabs). 25 tablet PRN  . omeprazole (PRILOSEC) 20 MG capsule TAKE 1 CAPSULE(20 MG) BY MOUTH DAILY 90 capsule 3  . Spacer/Aero-Holding Chambers (AEROCHAMBER MV) inhaler Use as instructed 1 each 0  . traMADol (ULTRAM) 50 MG tablet TAKE 1 TABLET BY MOUTH EVERY 6 HOURS AS NEEDED FOR PAIN 30 tablet 2   No current facility-administered medications for this visit.    Allergies:   Baclofen, Codeine phosphate, Hydrochlorothiazide, Montelukast sodium, and Norco [hydrocodone-acetaminophen]    Social History:  The patient  reports that she quit smoking about 10 years ago. Her smoking use included cigarettes. She smoked 1.00 pack per day. She has never used smokeless tobacco. She reports current alcohol use of about 13.0 standard drinks of alcohol per week. She reports that she does not use drugs.   Family History:  The patient's family history includes Dementia in her mother; Heart attack in her father; Heart disease in her father.    ROS:  General:no colds or fevers, no weight changes Skin:no rashes or ulcers HEENT:no blurred vision, no  congestion CV:see HPI PUL:see HPI GI:no diarrhea constipation or melena, no indigestion GU:no hematuria, no dysuria MS:no joint pain, no claudication Neuro:no syncope, no lightheadedness Endo:pre diabetes, no thyroid disease  Wt Readings from Last 3 Encounters:  12/12/19 172 lb 9.6 oz (78.3 kg)  12/03/19 167 lb 9.6 oz (76 kg)  10/18/19 170 lb 12.8 oz (77.5 kg)     PHYSICAL EXAM: VS:  BP 132/78   Pulse 87   Ht 5\' 5"  (1.651 m)   Wt 172 lb 9.6 oz (78.3 kg)   SpO2 97%   BMI 28.72 kg/m  , BMI Body mass index is 28.72 kg/m. General:Pleasant affect, NAD Skin:Warm and dry, brisk capillary refill HEENT:normocephalic, sclera clear, mucus membranes moist Neck:supple, no JVD, no bruits  Heart:S1S2 RRR without murmur, gallup, rub or click Lungs:clear without rales, rhonchi, or wheezes BHA:LPFX, non tender, + BS, do not palpate liver spleen or masses Ext:1-2+ lower ext edema mid tibia bil. , 2+ pedal pulses, 2+ radial pulses Neuro:alert and oriented X 3, MAE, follows commands, + facial symmetry    EKG:  EKG is ordered today. The ekg ordered today demonstrates atrial fib with LBBB,with improved rate control at 87, sensing and pacing approp. No acute changes.    Recent Labs: 07/02/2019: TSH 3.110 10/03/2019: ALT 13; Hemoglobin 11.2; Platelets 246 12/03/2019: BUN 34; Creatinine, Ser 1.97; Potassium 4.3; Sodium 142    Lipid Panel    Component Value Date/Time   CHOL 130 08/11/2016 1101   TRIG 106 08/11/2016 1101   HDL 37 (L) 08/11/2016 1101   CHOLHDL 3.5 08/11/2016 1101   CHOLHDL 3.4 07/30/2015 1231   VLDL 29 07/30/2015 1231   LDLCALC 72 08/11/2016 1101   LDLDIRECT 59 11/29/2018 1310   LDLDIRECT 74 05/08/2014 1110       Other studies Reviewed: Additional studies/ records that were reviewed today include: . Echo 08/22/19 IMPRESSIONS    1. Left ventricular ejection fraction, by estimation, is 50 to 55%. The  left ventricle has low normal function. The left ventricle has no  regional  wall motion abnormalities. There is mild left ventricular hypertrophy of  the basal-septal segment. Left  ventricular diastolic parameters are indeterminate.  2. Right ventricular systolic function is normal. The right ventricular  size is normal. There is moderately elevated pulmonary artery systolic  pressure. The estimated right ventricular systolic pressure is 90.2 mmHg.  3. Left atrial size was severely dilated.  4. The mitral valve is normal in structure. Mild to moderate mitral valve  regurgitation. No evidence of mitral stenosis.  5. Tricuspid valve regurgitation is moderate.  6. The aortic valve is tricuspid. Aortic valve regurgitation is not  visualized. Mild aortic valve sclerosis is present, with no evidence of  aortic valve stenosis.  7. The inferior vena cava is normal in size with greater than 50%  respiratory variability, suggesting right atrial pressure of 3 mmHg.  8. Compared to prior echo, PASP has increased from 18 to 41mmHg.   FINDINGS  Left Ventricle: Left ventricular ejection fraction, by estimation, is 50  to 55%. The left ventricle has low normal function. The left ventricle has  no regional wall motion abnormalities. The left ventricular internal  cavity size was normal in size.  There is mild left ventricular hypertrophy  of the basal-septal segment.  Abnormal (paradoxical) septal motion, consistent with RV pacemaker. Left  ventricular diastolic parameters are indeterminate.   Right Ventricle: The right ventricular size is normal. No increase in  right ventricular wall thickness. Right ventricular systolic function is  normal. There is moderately elevated pulmonary artery systolic pressure.  The tricuspid regurgitant velocity is  3.76 m/s, and with an assumed right atrial pressure of 8 mmHg, the  estimated right ventricular systolic pressure is 94.1 mmHg.   Left Atrium: Left atrial size was severely dilated.   Right Atrium: Right atrial  size was normal in size.   Pericardium: There is no evidence of pericardial effusion.   Mitral Valve: The mitral valve is normal in structure. Normal mobility of  the mitral valve leaflets. Mild to moderate mitral valve regurgitation. No  evidence of mitral valve stenosis.   Tricuspid Valve: The tricuspid valve is normal in structure. Tricuspid  valve regurgitation is moderate . No evidence of tricuspid stenosis.   Aortic Valve: The aortic valve is tricuspid. Aortic valve regurgitation is  not visualized. Mild aortic valve sclerosis is present, with no evidence  of aortic valve stenosis.   Pulmonic Valve: The pulmonic valve was normal in structure. Pulmonic valve  regurgitation is mild. No evidence of pulmonic stenosis.   Aorta: The aortic root is normal in size and structure.   Venous: The inferior vena cava is normal in size with greater than 50%  respiratory variability, suggesting right atrial pressure of 3 mmHg.   IAS/Shunts: The interatrial septum appears to be lipomatous. No atrial  level shunt detected by color flow Doppler.   Additional Comments: A pacer wire is visualized.     ASSESSMENT AND PLAN:  1.  Lower ext edema/chronic diastolic CHF still on lasix 20 daily.  She noted her legs were better last week, her wt is back up. But edema overall improved.  Lungs clear.  Will leave on lasix 20 mg until we see Cr.  May need to decrease to every other day.  She is not wearing stockings, have encouraged her to wear  Follow up in 1 month.  Check BMP today    2.  Permanent a fib with improved rate control after increase of BB - HR at 87 and occ paced beat. Not a candidate for DCCV and amiodarone was stopped  3.  Anticoagulation, pt tells me she is taking Eliquis 2.5 mg once daily.discussed it is twice per day.  Will check with Dr. [name] - several notes it was stopped but it remained on med list.     4.  CAD with hx of Rt coronary stent bare metal 2010 low risk stress test  2016.  No angina  5.  PPM for tachy brady syndrome original placed 2005, has had gen change.  6.  CKD 4 with hx of nephrectomy  monitoring BMP  7. COPD per PCP  Current medicines are reviewed with the patient today.  The patient Has no concerns regarding medicines.  The following changes have been made:  See above Labs/ tests ordered today include:see above  Disposition:   FU:  see above  Signed, Cecilie Kicks, NP  12/12/2019 11:43 AM    [address], [place], [place], [place] Phone: [phone]; Fax: [phone]

## 2019-12-12 ENCOUNTER — Other Ambulatory Visit: Payer: Self-pay

## 2019-12-12 ENCOUNTER — Ambulatory Visit: Payer: Medicare Other | Admitting: Cardiology

## 2019-12-12 ENCOUNTER — Encounter: Payer: Self-pay | Admitting: Cardiology

## 2019-12-12 ENCOUNTER — Ambulatory Visit (INDEPENDENT_AMBULATORY_CARE_PROVIDER_SITE_OTHER): Payer: Medicare Other | Admitting: Cardiology

## 2019-12-12 ENCOUNTER — Other Ambulatory Visit: Payer: Medicare Other | Admitting: *Deleted

## 2019-12-12 VITALS — BP 132/78 | HR 87 | Ht 65.0 in | Wt 172.6 lb

## 2019-12-12 DIAGNOSIS — Z79899 Other long term (current) drug therapy: Secondary | ICD-10-CM

## 2019-12-12 DIAGNOSIS — I5032 Chronic diastolic (congestive) heart failure: Secondary | ICD-10-CM

## 2019-12-12 DIAGNOSIS — Z9861 Coronary angioplasty status: Secondary | ICD-10-CM | POA: Diagnosis not present

## 2019-12-12 DIAGNOSIS — N184 Chronic kidney disease, stage 4 (severe): Secondary | ICD-10-CM | POA: Diagnosis not present

## 2019-12-12 DIAGNOSIS — I2510 Atherosclerotic heart disease of native coronary artery without angina pectoris: Secondary | ICD-10-CM | POA: Diagnosis not present

## 2019-12-12 DIAGNOSIS — I4819 Other persistent atrial fibrillation: Secondary | ICD-10-CM | POA: Diagnosis not present

## 2019-12-12 DIAGNOSIS — Z950 Presence of cardiac pacemaker: Secondary | ICD-10-CM

## 2019-12-12 DIAGNOSIS — M7989 Other specified soft tissue disorders: Secondary | ICD-10-CM

## 2019-12-12 NOTE — Patient Instructions (Addendum)
Medication Instructions:  Your physician recommends that you continue on your current medications as directed. Please refer to the Current Medication list given to you today but go back to taking the Eliquis twice a day    *If you need a refill on your cardiac medications before your next appointment, please call your pharmacy*   Lab Work: TODAY:  BMET   If you have labs (blood work) drawn today and your tests are completely normal, you will receive your results only by: Marland Kitchen MyChart Message (if you have MyChart) OR . A paper copy in the mail If you have any lab test that is abnormal or we need to change your treatment, we will call you to review the results.   Testing/Procedures: None ordered   Follow-Up: At [facility], you and your health needs are our priority.  As part of our continuing mission to provide you with exceptional heart care, we have created designated Provider Care Teams.  These Care Teams include your primary Cardiologist (physician) and Advanced Practice Providers (APPs -  Physician Assistants and Nurse Practitioners) who all work together to provide you with the care you need, when you need it.  We recommend signing up for the patient portal called "MyChart".  Sign up information is provided on this After Visit Summary.  MyChart is used to connect with patients for Virtual Visits (Telemedicine).  Patients are able to view lab/test results, encounter notes, upcoming appointments, etc.  Non-urgent messages can be sent to your provider as well.   To learn more about what you can do with MyChart, go to [email or web address].    Your next appointment:   1 month(s)    01/09/20 ARRIVE AT 10:00    The format for your next appointment:   In Person  Provider:   You may see Sinclair Grooms, MD or one of the following Advanced Practice Providers on your designated Care Team:    Truitt Merle, NP  Cecilie Kicks, NP  Kathyrn Drown, NP    Other Instructions

## 2019-12-13 DIAGNOSIS — I4891 Unspecified atrial fibrillation: Secondary | ICD-10-CM | POA: Diagnosis not present

## 2019-12-13 DIAGNOSIS — I2510 Atherosclerotic heart disease of native coronary artery without angina pectoris: Secondary | ICD-10-CM | POA: Diagnosis not present

## 2019-12-13 DIAGNOSIS — Z905 Acquired absence of kidney: Secondary | ICD-10-CM | POA: Diagnosis not present

## 2019-12-13 DIAGNOSIS — N189 Chronic kidney disease, unspecified: Secondary | ICD-10-CM | POA: Diagnosis not present

## 2019-12-13 DIAGNOSIS — Z7951 Long term (current) use of inhaled steroids: Secondary | ICD-10-CM | POA: Diagnosis not present

## 2019-12-13 DIAGNOSIS — I509 Heart failure, unspecified: Secondary | ICD-10-CM | POA: Diagnosis not present

## 2019-12-13 LAB — BASIC METABOLIC PANEL
BUN/Creatinine Ratio: 19 (ref 12–28)
BUN: 31 mg/dL (ref 10–36)
CO2: 26 mmol/L (ref 20–29)
Calcium: 8.4 mg/dL — ABNORMAL LOW (ref 8.7–10.3)
Chloride: 104 mmol/L (ref 96–106)
Creatinine, Ser: 1.67 mg/dL — ABNORMAL HIGH (ref 0.57–1.00)
GFR calc Af Amer: 30 mL/min/{1.73_m2} — ABNORMAL LOW (ref >59)
GFR calc non Af Amer: 26 mL/min/{1.73_m2} — ABNORMAL LOW (ref >59)
Glucose: 101 mg/dL — ABNORMAL HIGH (ref 65–99)
Potassium: 4.6 mmol/L (ref 3.5–5.2)
Sodium: 142 mmol/L (ref 134–144)

## 2019-12-16 DIAGNOSIS — Z7951 Long term (current) use of inhaled steroids: Secondary | ICD-10-CM | POA: Diagnosis not present

## 2019-12-16 DIAGNOSIS — I4891 Unspecified atrial fibrillation: Secondary | ICD-10-CM | POA: Diagnosis not present

## 2019-12-16 DIAGNOSIS — I509 Heart failure, unspecified: Secondary | ICD-10-CM | POA: Diagnosis not present

## 2019-12-16 DIAGNOSIS — N189 Chronic kidney disease, unspecified: Secondary | ICD-10-CM | POA: Diagnosis not present

## 2019-12-16 DIAGNOSIS — Z905 Acquired absence of kidney: Secondary | ICD-10-CM | POA: Diagnosis not present

## 2019-12-16 DIAGNOSIS — I2510 Atherosclerotic heart disease of native coronary artery without angina pectoris: Secondary | ICD-10-CM | POA: Diagnosis not present

## 2019-12-17 DIAGNOSIS — Z23 Encounter for immunization: Secondary | ICD-10-CM | POA: Diagnosis not present

## 2019-12-18 DIAGNOSIS — N189 Chronic kidney disease, unspecified: Secondary | ICD-10-CM | POA: Diagnosis not present

## 2019-12-18 DIAGNOSIS — I2510 Atherosclerotic heart disease of native coronary artery without angina pectoris: Secondary | ICD-10-CM | POA: Diagnosis not present

## 2019-12-18 DIAGNOSIS — I509 Heart failure, unspecified: Secondary | ICD-10-CM | POA: Diagnosis not present

## 2019-12-18 DIAGNOSIS — Z7951 Long term (current) use of inhaled steroids: Secondary | ICD-10-CM | POA: Diagnosis not present

## 2019-12-18 DIAGNOSIS — I4891 Unspecified atrial fibrillation: Secondary | ICD-10-CM | POA: Diagnosis not present

## 2019-12-18 DIAGNOSIS — Z905 Acquired absence of kidney: Secondary | ICD-10-CM | POA: Diagnosis not present

## 2019-12-19 DIAGNOSIS — H353231 Exudative age-related macular degeneration, bilateral, with active choroidal neovascularization: Secondary | ICD-10-CM | POA: Diagnosis not present

## 2019-12-23 DIAGNOSIS — I4891 Unspecified atrial fibrillation: Secondary | ICD-10-CM | POA: Diagnosis not present

## 2019-12-23 DIAGNOSIS — I509 Heart failure, unspecified: Secondary | ICD-10-CM | POA: Diagnosis not present

## 2019-12-23 DIAGNOSIS — Z7951 Long term (current) use of inhaled steroids: Secondary | ICD-10-CM | POA: Diagnosis not present

## 2019-12-23 DIAGNOSIS — I2510 Atherosclerotic heart disease of native coronary artery without angina pectoris: Secondary | ICD-10-CM | POA: Diagnosis not present

## 2019-12-23 DIAGNOSIS — N189 Chronic kidney disease, unspecified: Secondary | ICD-10-CM | POA: Diagnosis not present

## 2019-12-23 DIAGNOSIS — Z905 Acquired absence of kidney: Secondary | ICD-10-CM | POA: Diagnosis not present

## 2019-12-25 ENCOUNTER — Telehealth: Payer: Self-pay | Admitting: Interventional Cardiology

## 2019-12-25 ENCOUNTER — Telehealth: Payer: Self-pay

## 2019-12-25 ENCOUNTER — Encounter: Payer: Self-pay | Admitting: Cardiology

## 2019-12-25 ENCOUNTER — Ambulatory Visit (INDEPENDENT_AMBULATORY_CARE_PROVIDER_SITE_OTHER): Payer: Medicare Other | Admitting: Cardiology

## 2019-12-25 ENCOUNTER — Other Ambulatory Visit: Payer: Self-pay

## 2019-12-25 VITALS — BP 110/60 | HR 93 | Ht 65.0 in | Wt 179.0 lb

## 2019-12-25 DIAGNOSIS — I509 Heart failure, unspecified: Secondary | ICD-10-CM | POA: Diagnosis not present

## 2019-12-25 DIAGNOSIS — I4821 Permanent atrial fibrillation: Secondary | ICD-10-CM

## 2019-12-25 DIAGNOSIS — I2510 Atherosclerotic heart disease of native coronary artery without angina pectoris: Secondary | ICD-10-CM | POA: Diagnosis not present

## 2019-12-25 DIAGNOSIS — I5033 Acute on chronic diastolic (congestive) heart failure: Secondary | ICD-10-CM

## 2019-12-25 DIAGNOSIS — M7989 Other specified soft tissue disorders: Secondary | ICD-10-CM

## 2019-12-25 DIAGNOSIS — I4891 Unspecified atrial fibrillation: Secondary | ICD-10-CM | POA: Diagnosis not present

## 2019-12-25 DIAGNOSIS — Z7951 Long term (current) use of inhaled steroids: Secondary | ICD-10-CM | POA: Diagnosis not present

## 2019-12-25 DIAGNOSIS — Z905 Acquired absence of kidney: Secondary | ICD-10-CM | POA: Diagnosis not present

## 2019-12-25 DIAGNOSIS — N189 Chronic kidney disease, unspecified: Secondary | ICD-10-CM | POA: Diagnosis not present

## 2019-12-25 MED ORDER — FUROSEMIDE 40 MG PO TABS: ORAL_TABLET | ORAL | 3 refills | Status: DC

## 2019-12-25 NOTE — Telephone Encounter (Signed)
Verbal order for RN visits 1x a week for 5 weeks AND  compression wraps for LE weekly.   Cardiology (12/12/19) ordered Lasix 20 mg oral every other day.  I agree with this dose and schedule.

## 2019-12-25 NOTE — Progress Notes (Signed)
Cardiology Office Note:    Date:  12/25/2019   ID:  MIEL WISENER, DOB 1923-09-27, MRN [id number]  PCP:  McDiarmid, Blane Ohara, MD  [facility] Cardiologist:  Sinclair Grooms, MD  [facility] Electrophysiologist:  Cristopher Peru, MD   Referring MD: McDiarmid, Blane Ohara, MD    History of Present Illness:    [name] is a 86 y.o. female with a hx of CAD s/p RCA PCI, persistent Afib, symptomatic sinus node dysfunction, COPD, tachy-brady syndrome s/p PPM insertion, chronic diastolic HF, HTN, CKD stage 4 s/p nephrectomy who presents to clinic for 12lbs weight gain after dietary indiscretion.  Last saw Cecilie Kicks, NP on 12/12/19 after she presented with 5lbs weight fain with worsening SOB and HR 110-120s in the setting of dietary indiscretion. She was instructed by DOD at that time to increase her lasix to 40mg  daily with repeat BMET. Her BB was also increased due to Afib with RVR. She was doing well until she ate BBQ. Her home health nurse called today and stated the patient had worsening LE swelling, 10-12lbs weight gain, and associated SOB. Took lasix 20mg  PO yesterday without improvement. She is now being evaluated in clinic for further management.  Patient admits to eating a lot of salt a couple of weeks ago at [facility]. Has been gaining fluid over the course of 2 weeks with about 12lbs weight gain. She states she has a hard time being compliant with a low salt diet. Has been taking her medications as prescribed. Has some increase in her chronic SOB. No fevers, chills, bleeding issues.  Last echo 08/2019 showed LVEF 50-55%, severely dilated LA, mild to mod MR, mod TR, mild AS.   Past Medical History:  Diagnosis Date  . Abnormal mammogram, unspecified 08/23/2010   Followup imaging reassuring.   . Acute appendicitis with rupture   . Acute on chronic diastolic heart failure ([place]) 10/16/2017  . ADENOMATOUS COLONIC POLYP 03/01/2003   Qualifier: Diagnosis of  By: McDiarmid MD, Sherren Mocha Multiple benign  polyps of cecum, ascending, transverse and sigmoid colon by 8/09 colonoscopy by Dr Cristina Gong  Colonoscopy by Dr Cristina Gong for iron-deficiency anemia on 04/27/2010 showed three sessile polyps that were in ascending (3 mm x 9 mm), transverse (4 mm), and cecum (3 mm).  All three were tubular adenomas that were negative for high grade dysplasia or malignancy on pathology. Dr Cristina Gong called the polpys benign and not requiring follow-up in view of the patients age.    . Adrenal adenoma    Incidentaloma  . AF (paroxysmal atrial fibrillation) ([place]) 11/11/2010   Hospitalization (9/8-9/10, Dr Daneen Schick, III, Cardiology) for Paroxysmal Atrial Fibrillation with RVR and anginal pain secondary to demand/supply mismatch in setting of RVR with known circumflex artery branch disease.    Marland Kitchen AKI (acute kidney injury) ([place])   . ANXIETY 04/27/2006   Qualifier: Diagnosis of  By: McDiarmid MD, Sherren Mocha    . Benign essential tremor 02/04/2016  . Candidiasis of the esophagus 11/26/2007  . CAP (community acquired pneumonia) 02/25/2015  . Cataract 2013   Bilateral   . Cellulitis of leg, right 10/01/2012  . Cellulitis of right lower extremity   . Chest pain with moderate risk of acute coronary syndrome 05/21/2015  . Chronic kidney disease (CKD), stage III (moderate) (HCC)   . Concussion with loss of consciousness   . COPD 04/27/2006   Qualifier: Diagnosis of  By: McDiarmid MD, Sherren Mocha    . COPD exacerbation ([place])   .  COPD, severe   . Decreased functional mobility and endurance 03/17/2015  . Dementia [facility]), possible Alzhemier's type 10/26/2012   Montreal Cognitive Assessment (25 out of 30, points off in visuospatial and and short-term memory).  Independent in iADLs and ADLs.    . Diastolic heart failure ([place]) 07/30/2015  . DISC WITH RADICULOPATHY 04/27/2006   Qualifier: Diagnosis of  By: McDiarmid MD, Sherren Mocha    . Dyspnea   . EDEMA-LEGS,DUE TO VENOUS OBSTRUCT. 04/27/2006   Qualifier: Diagnosis of  By: McDiarmid MD, Sherren Mocha    . Fall 10/27/2016    . Fracture of 5th metatarsal, Right, avulsion fx of tip styloid 11/09/2017  . GERD (gastroesophageal reflux disease) 11/08/2015  . 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 Normal uric acid serum level at time of attack.    . Hand trauma, right, initial encounter 03/20/2018  . HERNIA, HIATAL, NONCONGENITAL 04/27/2006   Qualifier: Diagnosis of  By: McDiarmid MD, Sherren Mocha    . High risk medications (not anticoagulants) long-term use 03/05/2012  . History of Hemorrhoids 04/27/2006   Qualifier: Diagnosis of  By: McDiarmid MD, Sherren Mocha    . History of iron deficiency 01/16/2015  . History of Iron deficiency anemia due to chronic GI blood loss 08/05/2010   Dr Cristina Gong (GI) has evaluated with EGD, colonoscopy, and video capsular endoscopy in 2011 & 2012.  All have been unrevealing as to an origin of IDA.  OV with Dr Cristina Gong (10/28/10) assessment of blood in stool per hemoccult and GER. Hbg 12.1 g/dL, MCV 91.8, Ferritin 30 ng/mL. Patient taking on ferrous sulfate tab daily.   EGD on 03/06/12 by Dr Cristina Gong for IDA non-obstructing Schatzki's ring at [place]  . Hx of colonoscopy with polypectomy 04/27/2010   Dr Cristina Gong found three  tubular adenomas each less than 10 mm size  . Hypertension   . Hypotension 07/09/2015  . Impaired mobility and ADLs 09/01/2017  . Iron deficiency anemia 08/05/2010   Dr Cristina Gong (GI) has evaluated with EGD, colonoscopy, and video capsular endoscopy in 2011 & 2012.  All have been unrevealing as to an origin of IDA.  OV with Dr Cristina Gong (10/28/10) assessment of blood in stool per hemoccult and GER. Hbg 12.1 g/dL, MCV 91.8, Ferritin 30 ng/mL. Patient taking on ferrous sulfate tab daily.   EGD on 03/06/12 by Dr Cristina Gong for IDA non-obstructing Schatzki's ring at Gastroesophageal junction, otherwise normal esophagus and stomach.     . Laceration of arm, unspecified laterality, initial encounter 11/03/2017  . Left leg cellulitis   . Left  Metatarsal fracture, 5th, avulsion styloid 12/29/2017  . Leg cramps 05/29/2012  . Low back pain   . Lumbar herniated disc    History of HNP L4/5 in 2003  . Macular degeneration, bilateral 10/04/2010   Right eye is wet MD, the other is dry macular degeneration (ARMD). Pt undergoing some form of vascular endothelial growth factor inhibition intraocular therapy.    . Mild cognitive impairment 10/26/2012   (10/25/12) Failed MiniCog screen  . MUSCLE CRAMPS 03/11/2010   Qualifier: Diagnosis of  By: McDiarmid MD, Sherren Mocha    . Muscle spasm of back 09/24/2013  . Myocardial infarct, old   . Nocturia 10/28/2016  . Numbness and tingling in hands 07/21/2011  . Pacemaker    MDT  . Paroxysmal atrial fibrillation (HCC)   . Persistent atrial fibrillation ([place]) 05/31/2014  . PREDIABETES 09/11/2007   Qualifier: Diagnosis of  By:  McDiarmid MD, Sherren Mocha    . Retinal hemorrhage of left eye 06/2010  . RHINITIS, ALLERGIC 04/27/2006   Qualifier: Diagnosis of  By: McDiarmid MD, Sherren Mocha    . SCHATZKI'S RING, HX OF 11/26/2007   Qualifier: Diagnosis of  By: McDiarmid MD, Sherren Mocha  An EGD was performed by Dr Cristina Gong on 04/27/2010 for iron deficiency anemia. There was a a transient hiatal hernia with Schatzki's ring. Stomach and duodenum were normal. EGD on 03/06/12 by Dr Cristina Gong for IDA non-obstructing Schatzki's ring at Gastroesophageal junction, otherwise normal esophagus and stomach.    . Seborrheic keratosis, right anterior thigh 12/12/2013  . Shoulder pain, left 01/09/2014  . SICK SINUS SYNDROME 04/27/2006   S/P dual chamber pacemaker placement by Dr Osie Cheeks (EPS-Card) with pacemaker lead extraction & reinsertion - 07/25/2003  Hospitalization (9/8-9/10, Dr Daneen Schick, III, Cardiology) for Paroxysmal Atrial Fibrillation with RVR and anginal pain secondary to demand/supply mismatch in setting of RVR with known circumflex artery branch disease.    . Sick sinus syndrome with tachycardia ([place])    MDT  . Soft tissue injury of foot 05/03/2011    . Solar lentigo 06/15/2012  . Solitary kidney, acquired 05/17/2010   Surgical removal for transitional cell cancer by Tresa Endo, MD ([place]). Surveillance cystoscopy by Dr Alinda Money [facility]) on 10/19/12 without evidence of cystoscopic recurrence. Recommend RTC one year for cystoscopy.   Marland Kitchen Spinal stenosis, lumbar   . Transitional cell carcinoma of ureter, history   . Traumatic ecchymosis of left foot 12/07/2017  . Trigger point with neck pain 10/26/2018  . Urge incontinence 12/13/2011   Diagnosed in 10/2011 by Dr Bjorn Loser (Urology)   . Venous stasis ulcer of ankle, left ([place]) 10/24/2014  . VENTRICULAR HYPERTROPHY, LEFT 08/28/2008   Qualifier: Diagnosis of  By: McDiarmid MD, Sherren Mocha    . VITAMIN B12 DEFICIENCY 10/07/2009   Qualifier: Diagnosis of  By: McDiarmid MD, Sherren Mocha  Dx based on a post-TKR anemia work-up Low normal serum B12 with high Methylmalonic acid and homocysteine level  Vit B12 serum level (10/28/10) > 1500 pg/mL   . Vitamin D deficiency 11/02/2010   Serum vitamin D 25(OH) = 10.9 ng/mL (30 -100) on 10/28/10 c/w Vitamin D deficiency.     . Wound of left foot 12/29/2017    Past Surgical History:  Procedure Laterality Date  . BREAST SURGERY     breast reduction  . CARDIOVERSION N/A 10/20/2017   Procedure: CARDIOVERSION;  Surgeon: Josue Hector, MD;  Location: [facility] ENDOSCOPY;  Service: Cardiovascular;  Laterality: N/A;  . CHOLECYSTECTOMY    . CORONARY ANGIOPLASTY WITH STENT PLACEMENT  2010   BMS RCA, OM2 occluded  . CYSTOSCOPY  09/30/2015   No cystoscopic evidence of uroepithelial neoplasm.  Follow up surveillance cystoscopy in one year  . CYSTOSCOPY  10/13/2017   Dr Raynelle Bring [facility]rology)  . DUPUYTREN CONTRACTURE RELEASE Right 05/22/2014   Procedure: DUPUYTREN RELEASE AND REPAIR AS NECESSARY RIGHT RING FINGER AND MIDDLE FINGER;  Surgeon: Roseanne Kaufman, MD;  Location: [facility] [place];  Service: Orthopedics;  Laterality: Right;  . ESOPHAGOGASTRODUODENOSCOPY  04/27/2010   Dr  Cristina Gong - found transient H/H & Schatzki's ring  . ESOPHAGOGASTRODUODENOSCOPY ENDOSCOPY  03/06/2012   Dr Cristina Gong - found non-obstuctive Schatzki's ring at [employer] o/w normal EGD.   Marland Kitchen EYE SURGERY     bilateral cataracts  . KNEE ARTHROSCOPY W/ SYNOVECTOMY  11/2009, left knee   Dr Wynelle Link  . NEPHRECTOMY  For transition cell cancer    Dr  Tresa Endo, Psychologist, sport and exercise  . PACEMAKER GENERATOR CHANGE N/A 11/12/2012   Procedure: PACEMAKER GENERATOR CHANGE;  Surgeon: Evans Lance, MD; [employer]tr[employer] [facility] dual-chamber pacemaker serial number [id number]; Laterality: Right  . PACEMAKER INSERTION  2005   Dr Doreatha Lew  . PACEMAKER LEAD REMOVAL  2005   Removal and reinsertion of atrial and ventricular leads due to migration  . REPLACEMENT TOTAL KNEE  2009, Right knee   Dr Wynelle Link  . REPLACEMENT TOTAL KNEE  05/2009, Left knee   Dr Wynelle Link  . TONSILLECTOMY    . TRIGGER FINGER RELEASE Right 05/22/2014   Procedure: RIGHT HAND A-1 PULLEY RELEASE ;  Surgeon: Roseanne Kaufman, MD;  Location: [place];  Service: Orthopedics;  Laterality: Right;    Current Medications: Current Meds  Medication Sig  . albuterol (PROVENTIL) (2.5 MG/3ML) 0.083% nebulizer solution USE 1 VIAL VIA NEBULIZER EVERY 4 HOURS AS NEEDED FOR WHEEZING OR SHORTNESS OF BREATH  . albuterol (VENTOLIN HFA) 108 (90 Base) MCG/ACT inhaler Inhale 2 puffs into the lungs every 4 (four) hours as needed for wheezing or shortness of breath.  Marland Kitchen atorvastatin (LIPITOR) 20 MG tablet TAKE 1 TABLET BY MOUTH DAILY  . ELIQUIS 2.5 MG TABS tablet TAKE 1 TABLET(2.5 MG) BY MOUTH TWICE DAILY  . fluticasone (FLONASE) 50 MCG/ACT nasal spray SHAKE LIQUID AND USE 2 SPRAYS IN EACH NOSTRIL DAILY  . Fluticasone-Umeclidin-Vilant (TRELEGY ELLIPTA) 100-62.5-25 MCG/INH AEPB Inhale 1 puff into the lungs daily.  . furosemide (LASIX) 40 MG tablet Take one tablet (40mg ) by mouth daily for 3 days, then take 1/2 tablet (20 mg) by mouth daily until all swelling/fluid is off, then 1/2 tablet (20 mg) by  mouth only as needed thereafter  . metoprolol succinate (TOPROL XL) 25 MG 24 hr tablet Take 1.5 tablets (37.5 mg total) by mouth daily.  . nitroGLYCERIN (NITROSTAT) 0.4 MG SL tablet Place 1 tablet (0.4 mg total) under the tongue every 5 (five) minutes as needed for chest pain (x 3 tabs).  Marland Kitchen omeprazole (PRILOSEC) 20 MG capsule TAKE 1 CAPSULE(20 MG) BY MOUTH DAILY  . Spacer/Aero-Holding Chambers (AEROCHAMBER MV) inhaler Use as instructed  . traMADol (ULTRAM) 50 MG tablet TAKE 1 TABLET BY MOUTH EVERY 6 HOURS AS NEEDED FOR PAIN  . [DISCONTINUED] furosemide (LASIX) 40 MG tablet TAKE 1/2 TABLET(20 MG) BY MOUTH DAILY AS NEEDED FOR FLUID RETENTION OR SWELLING     Allergies:   Baclofen, Codeine phosphate, Hydrochlorothiazide, Montelukast sodium, and Norco [hydrocodone-acetaminophen]   Social History   Socioeconomic History  . Marital status: Widowed    Spouse name: Not on file  . Number of children: 4  . Years of education: Not on file  . Highest education level: Not on file  Occupational History  . Occupation: Art therapist: RETIRED    Comment: Retired  Tobacco Use  . Smoking status: Former Smoker    Packs/day: 1.00    Types: Cigarettes    Quit date: 03/02/2009    Years since quitting: 10.8  . Smokeless tobacco: Never Used  Vaping Use  . Vaping Use: Never used  Substance and Sexual Activity  . Alcohol use: Yes    Alcohol/week: 13.0 standard drinks    Types: 7 Glasses of wine, 6 Standard drinks or equivalent per week    Comment: 5 oz white wine per day  . Drug use: No  . Sexual activity: Never  Other Topics Concern  . 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    Semi-retired Futures trader.     Pt has home nebulizer for inhalation therapies.   Former Smoker   Smoking Status:  quit > 5  years ago      (+) DNR status per discussion with Dr McDiarmid 10/25/12 and reiterated 06/20/13 office visit .   (+) Living Will/Advance Directive   (+) HC-POA: Cecile Sheerer Causey (pt's dgt)      Current Social History 09/22/2016        Patient lives with daughter, [name] (flight attendant), in two level home 09/22/2016   Transportation: Patient has own vehicle 09/22/2016   Important Relationships 4 daughters and 43 yo grandson 09/22/2016    Pets: None 09/22/2016   Education / Work:  College/ Retired Futures trader 09/22/2016   Interests / Fun: Reads 2 books per week, watches TV, entertains 09/22/2016   Current Stressors: 86 yo house in need of outside repairs 09/22/2016   Religious / Personal Beliefs: Joanie Coddington 09/22/2016   Other: Had a wonderful marriage/ Very thankful/ wants to continue traveling/ closest friends are all gone 09/22/2016   L. [place], RN, BSN                                                                                                  Social Determinants of Health   Financial Resource Strain:   . Difficulty of Paying Living Expenses: Not on file  Food Insecurity:   . Worried About Charity fundraiser in the Last Year: Not on file  . Ran Out of Food in the Last Year: Not on file  Transportation Needs:   . Lack of Transportation (Medical): Not on file  . Lack of Transportation (Non-Medical): Not on file  Physical Activity:   . Days of Exercise per Week: Not on file  . Minutes of Exercise per Session: Not on file  Stress:   . Feeling of Stress : Not on file  Social Connections:   . Frequency of Communication with Friends and Family: Not on file  . Frequency of Social Gatherings with Friends and Family: Not on file  . Attends Religious Services: Not on file  . Active Member of Clubs or Organizations: Not on file  . Attends Archivist Meetings: Not on file  . Marital Status: Not on file     Family History: The patient's family history includes  Dementia in her mother; Heart attack in her father; Heart disease in her father.  ROS:   Please see the history of present illness.    Review of Systems  Constitutional: Negative for chills, fever and weight loss.  HENT: Negative for sore throat.   Eyes: Negative for double vision.  Respiratory: Positive for shortness of breath.   Cardiovascular: Positive for leg swelling. Negative for chest pain and palpitations.  Gastrointestinal: Negative for blood in stool, nausea and vomiting.  Genitourinary: Negative for dysuria.  Neurological: Negative for dizziness  and loss of consciousness.  Psychiatric/Behavioral: Negative for depression.    EKGs/Labs/Other Studies Reviewed:    The following studies were reviewed today: Echo 08/22/19 IMPRESSIONS  1. Left ventricular ejection fraction, by estimation, is 50 to 55%. The  left ventricle has low normal function. The left ventricle has no regional  wall motion abnormalities. There is mild left ventricular hypertrophy of  the basal-septal segment. Left  ventricular diastolic parameters are indeterminate.  2. Right ventricular systolic function is normal. The right ventricular  size is normal. There is moderately elevated pulmonary artery systolic  pressure. The estimated right ventricular systolic pressure is 03.5 mmHg.  3. Left atrial size was severely dilated.  4. The mitral valve is normal in structure. Mild to moderate mitral valve  regurgitation. No evidence of mitral stenosis.  5. Tricuspid valve regurgitation is moderate.  6. The aortic valve is tricuspid. Aortic valve regurgitation is not  visualized. Mild aortic valve sclerosis is present, with no evidence of  aortic valve stenosis.  7. The inferior vena cava is normal in size with greater than 50%  respiratory variability, suggesting right atrial pressure of 3 mmHg.  8. Compared to prior echo, PASP has increased from 18 to 48mmHg.   FINDINGS  Left Ventricle: Left  ventricular ejection fraction, by estimation, is 50  to 55%. The left ventricle has low normal function. The left ventricle has  no regional wall motion abnormalities. The left ventricular internal  cavity size was normal in size.  There is mild left ventricular hypertrophy of the basal-septal segment.  Abnormal (paradoxical) septal motion, consistent with RV pacemaker. Left  ventricular diastolic parameters are indeterminate.   Right Ventricle: The right ventricular size is normal. No increase in  right ventricular wall thickness. Right ventricular systolic function is  normal. There is moderately elevated pulmonary artery systolic pressure.  The tricuspid regurgitant velocity is  3.76 m/s, and with an assumed right atrial pressure of 8 mmHg, the  estimated right ventricular systolic pressure is 00.9 mmHg.   Left Atrium: Left atrial size was severely dilated.   Right Atrium: Right atrial size was normal in size.   Pericardium: There is no evidence of pericardial effusion.   Mitral Valve: The mitral valve is normal in structure. Normal mobility of  the mitral valve leaflets. Mild to moderate mitral valve regurgitation. No  evidence of mitral valve stenosis.   Tricuspid Valve: The tricuspid valve is normal in structure. Tricuspid  valve regurgitation is moderate . No evidence of tricuspid stenosis.   Aortic Valve: The aortic valve is tricuspid. Aortic valve regurgitation is  not visualized. Mild aortic valve sclerosis is present, with no evidence  of aortic valve stenosis.   Pulmonic Valve: The pulmonic valve was normal in structure. Pulmonic valve  regurgitation is mild. No evidence of pulmonic stenosis.   Aorta: The aortic root is normal in size and structure.   Venous: The inferior vena cava is normal in size with greater than 50%  respiratory variability, suggesting right atrial pressure of 3 mmHg.   IAS/Shunts: The interatrial septum appears to be lipomatous. No atrial    level shunt detected by color flow Doppler.   Additional Comments: A pacer wire is visualized.   EKG:  EKG is  ordered today.  The ekg ordered today demonstrates Afib with LBBB. No pacing.  Recent Labs: 07/02/2019: TSH 3.110 10/03/2019: ALT 13; Hemoglobin 11.2; Platelets 246 12/12/2019: BUN 31; Creatinine, Ser 1.67; Potassium 4.6; Sodium 142  Recent Lipid Panel  Component Value Date/Time   CHOL 130 08/11/2016 1101   TRIG 106 08/11/2016 1101   HDL 37 (L) 08/11/2016 1101   CHOLHDL 3.5 08/11/2016 1101   CHOLHDL 3.4 07/30/2015 1231   VLDL 29 07/30/2015 1231   LDLCALC 72 08/11/2016 1101   LDLDIRECT 59 11/29/2018 1310   LDLDIRECT 74 05/08/2014 1110     Risk Assessment/Calculations:    CHA2DS2-VASc Score = 5  This indicates a 7.2% annual risk of stroke. The patient's score is based upon: CHF History: 1 HTN History: 1 Diabetes History: 0 Stroke History: 0 Vascular Disease History: 0 Age Score: 2 Gender Score: 1    Physical Exam:    VS:  BP 110/60   Pulse 93   Ht 5\' 5"  (1.651 m)   Wt 179 lb (81.2 kg)   SpO2 93%   BMI 29.79 kg/m     Wt Readings from Last 3 Encounters:  12/25/19 179 lb (81.2 kg)  12/12/19 172 lb 9.6 oz (78.3 kg)  12/03/19 167 lb 9.6 oz (76 kg)     GEN: Comfortable elderly lady in NAD HEENT: Normal NECK: +JVD; No carotid bruits CARDIAC: Irregularly irregular, no murmurs, rubs, gallops RESPIRATORY:  End-expiratory wheezes. No crackles  ABDOMEN: Soft, non-tender, non-distended MUSCULOSKELETAL:  3+ bilateral edema. Legs wrapped. Extremities warm. SKIN: Warm and dry NEUROLOGIC:  Alert and oriented x 3 PSYCHIATRIC:  Normal affect   ASSESSMENT:    1. Acute on chronic diastolic heart failure ([place])   2. Permanent atrial fibrillation (HCC)   3. Leg swelling   4. Coronary artery disease involving native heart without angina pectoris, unspecified vessel or lesion type    PLAN:    In order of problems listed above:  #Acute on chronic diastolic  heart failure #LE edema: Patient with 10-12lbs weight gain, worsening LE edema and SOB in the setting of dietary indiscretion.  -Take lasix 40mg  daily x3 days then 20mg  daily until back to baseline weight -Repeat BMET in 1 week to monitor renal function and Cr -Encourage compliance with salt restriction  -Monitor daily weights -Return to clinic in 2 weeks to ensure improved  #Permanent Afib s/p PPM placement: -Off amiodarone -Continue metop 25mg  daily -On apixaban 2.5mg  daily; no bleeding issues  #CAD s/p RCA PCI: BMS in 2010 with low risk stress test in 2016. No current anginal symptoms. -Continue atorvastatin 20mg  daily -No ASA because on reduced dosing of apixaban   Medication Adjustments/Labs and Tests Ordered: Current medicines are reviewed at length with the patient today.  Concerns regarding medicines are outlined above.  Orders Placed This Encounter  Procedures  . Basic metabolic panel  . EKG 12-Lead   Meds ordered this encounter  Medications  . furosemide (LASIX) 40 MG tablet    Sig: Take one tablet (40mg ) by mouth daily for 3 days, then take 1/2 tablet (20 mg) by mouth daily until all swelling/fluid is off, then 1/2 tablet (20 mg) by mouth only as needed thereafter    Dispense:  45 tablet    Refill:  3    Patient Instructions  Medication Instructions:  1. Increase the furosemide (Lasix) to (40 mg) by mouth daily for 3 days, then take 1/2 tablet (20 mg) by mouth daily until all fluid/swelling is off and then resume 1/2 tablet (20 mg) by mouth only as needed *If you need a refill on your cardiac medications before your next appointment, please call your pharmacy*   Lab Work: BMET in one week 01/01/2020 If you have labs (  blood work) drawn today and your tests are completely normal, you will receive your results only by: Marland Kitchen MyChart Message (if you have MyChart) OR . A paper copy in the mail If you have any lab test that is abnormal or we need to change your  treatment, we will call you to review the results.   Testing/Procedures: None   Follow-Up: At [facility], you and your health needs are our priority.  As part of our continuing mission to provide you with exceptional heart care, we have created designated Provider Care Teams.  These Care Teams include your primary Cardiologist (physician) and Advanced Practice Providers (APPs -  Physician Assistants and Nurse Practitioners) who all work together to provide you with the care you need, when you need it.  We recommend signing up for the patient portal called "MyChart".  Sign up information is provided on this After Visit Summary.  MyChart is used to connect with patients for Virtual Visits (Telemedicine).  Patients are able to view lab/test results, encounter notes, upcoming appointments, etc.  Non-urgent messages can be sent to your provider as well.   To learn more about what you can do with MyChart, go to [email or web address].    Your next appointment:    Keep your  01/10/2020, 10:15 AM appointment with Cecilie Kicks, NP.     Signed, Freada Bergeron, MD  12/25/2019 4:40 PM    [facility]

## 2019-12-25 NOTE — Telephone Encounter (Signed)
Attempted to reach [employer]. No answer. LVM of the verbal order from Dr. Wendy Poet. Salvatore Marvel, CMA

## 2019-12-25 NOTE — Patient Instructions (Addendum)
Medication Instructions:  1. Increase the furosemide (Lasix) to (40 mg) by mouth daily for 3 days, then take 1/2 tablet (20 mg) by mouth daily until all fluid/swelling is off and then resume 1/2 tablet (20 mg) by mouth only as needed *If you need a refill on your cardiac medications before your next appointment, please call your pharmacy*   Lab Work: BMET in one week 01/01/2020 If you have labs (blood work) drawn today and your tests are completely normal, you will receive your results only by:  [place] (if you have MyChart) OR  A paper copy in the mail If you have any lab test that is abnormal or we need to change your treatment, we will call you to review the results.   Testing/Procedures: None   Follow-Up: At [facility], you and your health needs are our priority.  As part of our continuing mission to provide you with exceptional heart care, we have created designated Provider Care Teams.  These Care Teams include your primary Cardiologist (physician) and Advanced Practice Providers (APPs -  Physician Assistants and Nurse Practitioners) who all work together to provide you with the care you need, when you need it.  We recommend signing up for the patient portal called "MyChart".  Sign up information is provided on this After Visit Summary.  MyChart is used to connect with patients for Virtual Visits (Telemedicine).  Patients are able to view lab/test results, encounter notes, upcoming appointments, etc.  Non-urgent messages can be sent to your provider as well.   To learn more about what you can do with MyChart, go to [email or web address].    Your next appointment:    Keep your  01/10/2020, 10:15 AM appointment with Cecilie Kicks, NP.

## 2019-12-25 NOTE — Telephone Encounter (Signed)
Pt c/o swelling: STAT is pt has developed SOB within 24 hours  1) How much weight have you gained and in what time span? 10-12 in about a week  2) If swelling, where is the swelling located? Legs  3) Are you currently taking a fluid pill? Yes, takes as needed  4) Are you currently SOB? yes  5) Do you have a log of your daily weights (if so, list)? 185 lbs today  6) Have you gained 3 pounds in a day or 5 pounds in a week?   7) Have you traveled recently? No   Megan from [place] states the patient has gained about 10-12 lbs in a week. She states she is also having swelling in her legs. She states she has +3 pitting and her ankle measurements are 28 cm. She states they are normally 23 cm. She states the patient is also currently SOB and has been SOB since yesterday. She states her lungs are clear, but she is SOB at rest. She is also requesting orders to wrap her legs.

## 2019-12-25 NOTE — Telephone Encounter (Signed)
[name], [facility] RN, calls nurse line requesting verbal orders to continue nursing as follows:   1x a week for 5 weeks  Megan also requesting VO for compression wraps for LE.  Megan requesting clarification on lasix. Is the patient to take daily or just PRN?  Please advise. [name] can be reached at [phone].

## 2019-12-25 NOTE — Telephone Encounter (Signed)
Home Health nurse is calling to report swelling in lower extreme ties and weight gain. Megan from [place] states the patient has gained about 10-12 lbs in a week. She states she is also having swelling in her legs. She states she has +3 pitting and her ankle measurements are 28 cm. She states they are normally 23 cm. She states the patient is also currently SOB and has been SOB since yesterday. She states her lungs are clear, but she is SOB at rest.   Jinny Blossom, [facility] is also requesting orders to wrap her legs. Patient has taken her 20 mg PRN Furosemide yesterday and today with no relief.   Spoke with DOD, Dr. Johney Frame who suggested patient come in for OV with her this afternoon. Patient agreeable and scheduled for 2:40pm.

## 2019-12-26 DIAGNOSIS — I509 Heart failure, unspecified: Secondary | ICD-10-CM | POA: Diagnosis not present

## 2019-12-26 DIAGNOSIS — Z7951 Long term (current) use of inhaled steroids: Secondary | ICD-10-CM | POA: Diagnosis not present

## 2019-12-26 DIAGNOSIS — I4891 Unspecified atrial fibrillation: Secondary | ICD-10-CM | POA: Diagnosis not present

## 2019-12-26 DIAGNOSIS — Z905 Acquired absence of kidney: Secondary | ICD-10-CM | POA: Diagnosis not present

## 2019-12-26 DIAGNOSIS — I2510 Atherosclerotic heart disease of native coronary artery without angina pectoris: Secondary | ICD-10-CM | POA: Diagnosis not present

## 2019-12-26 DIAGNOSIS — N189 Chronic kidney disease, unspecified: Secondary | ICD-10-CM | POA: Diagnosis not present

## 2019-12-30 DIAGNOSIS — Z905 Acquired absence of kidney: Secondary | ICD-10-CM | POA: Diagnosis not present

## 2019-12-30 DIAGNOSIS — N189 Chronic kidney disease, unspecified: Secondary | ICD-10-CM | POA: Diagnosis not present

## 2019-12-30 DIAGNOSIS — I2510 Atherosclerotic heart disease of native coronary artery without angina pectoris: Secondary | ICD-10-CM | POA: Diagnosis not present

## 2019-12-30 DIAGNOSIS — I4891 Unspecified atrial fibrillation: Secondary | ICD-10-CM | POA: Diagnosis not present

## 2019-12-30 DIAGNOSIS — I509 Heart failure, unspecified: Secondary | ICD-10-CM | POA: Diagnosis not present

## 2019-12-30 DIAGNOSIS — Z7951 Long term (current) use of inhaled steroids: Secondary | ICD-10-CM | POA: Diagnosis not present

## 2019-12-31 ENCOUNTER — Telehealth: Payer: Self-pay | Admitting: Interventional Cardiology

## 2019-12-31 NOTE — Telephone Encounter (Signed)
[name] (DPR), was informed her mother had recent visit due to fluid retention after eating heavily salted foods. She understands she will have repeat labs tomorrow and reevaluation visit 11/12. Sent her code to sign up for MyChart so she may read office notes since she is not able to come to visits and does not wish to be a part of them by phone.  She was grateful for assistance.

## 2019-12-31 NOTE — Telephone Encounter (Signed)
New Message:     Pt's daughter would like for Dr Thompson Caul nurse to give her a call please. She would like an update of pt's condition and office visits. Pt have Dementia and can not remember, daughter have not been able to come for visits.,she had a knee replacement.

## 2020-01-01 ENCOUNTER — Other Ambulatory Visit: Payer: Self-pay

## 2020-01-01 ENCOUNTER — Other Ambulatory Visit: Payer: Medicare Other | Admitting: *Deleted

## 2020-01-01 DIAGNOSIS — I5033 Acute on chronic diastolic (congestive) heart failure: Secondary | ICD-10-CM | POA: Diagnosis not present

## 2020-01-01 DIAGNOSIS — I2510 Atherosclerotic heart disease of native coronary artery without angina pectoris: Secondary | ICD-10-CM

## 2020-01-01 DIAGNOSIS — I4821 Permanent atrial fibrillation: Secondary | ICD-10-CM | POA: Diagnosis not present

## 2020-01-01 DIAGNOSIS — M7989 Other specified soft tissue disorders: Secondary | ICD-10-CM | POA: Diagnosis not present

## 2020-01-02 DIAGNOSIS — Z7951 Long term (current) use of inhaled steroids: Secondary | ICD-10-CM | POA: Diagnosis not present

## 2020-01-02 DIAGNOSIS — I509 Heart failure, unspecified: Secondary | ICD-10-CM | POA: Diagnosis not present

## 2020-01-02 DIAGNOSIS — N189 Chronic kidney disease, unspecified: Secondary | ICD-10-CM | POA: Diagnosis not present

## 2020-01-02 DIAGNOSIS — Z905 Acquired absence of kidney: Secondary | ICD-10-CM | POA: Diagnosis not present

## 2020-01-02 DIAGNOSIS — I4891 Unspecified atrial fibrillation: Secondary | ICD-10-CM | POA: Diagnosis not present

## 2020-01-02 DIAGNOSIS — I2510 Atherosclerotic heart disease of native coronary artery without angina pectoris: Secondary | ICD-10-CM | POA: Diagnosis not present

## 2020-01-02 LAB — BASIC METABOLIC PANEL
BUN/Creatinine Ratio: 16 (ref 12–28)
BUN: 26 mg/dL (ref 10–36)
CO2: 24 mmol/L (ref 20–29)
Calcium: 8.9 mg/dL (ref 8.7–10.3)
Chloride: 103 mmol/L (ref 96–106)
Creatinine, Ser: 1.66 mg/dL — ABNORMAL HIGH (ref 0.57–1.00)
GFR calc Af Amer: 30 mL/min/{1.73_m2} — ABNORMAL LOW (ref >59)
GFR calc non Af Amer: 26 mL/min/{1.73_m2} — ABNORMAL LOW (ref >59)
Glucose: 137 mg/dL — ABNORMAL HIGH (ref 65–99)
Potassium: 4.5 mmol/L (ref 3.5–5.2)
Sodium: 143 mmol/L (ref 134–144)

## 2020-01-03 DIAGNOSIS — N189 Chronic kidney disease, unspecified: Secondary | ICD-10-CM | POA: Diagnosis not present

## 2020-01-03 DIAGNOSIS — Z7901 Long term (current) use of anticoagulants: Secondary | ICD-10-CM | POA: Diagnosis not present

## 2020-01-03 DIAGNOSIS — Z905 Acquired absence of kidney: Secondary | ICD-10-CM | POA: Diagnosis not present

## 2020-01-03 DIAGNOSIS — Z9181 History of falling: Secondary | ICD-10-CM | POA: Diagnosis not present

## 2020-01-03 DIAGNOSIS — I509 Heart failure, unspecified: Secondary | ICD-10-CM | POA: Diagnosis not present

## 2020-01-03 DIAGNOSIS — Z7951 Long term (current) use of inhaled steroids: Secondary | ICD-10-CM | POA: Diagnosis not present

## 2020-01-03 DIAGNOSIS — I4891 Unspecified atrial fibrillation: Secondary | ICD-10-CM | POA: Diagnosis not present

## 2020-01-03 DIAGNOSIS — Z79891 Long term (current) use of opiate analgesic: Secondary | ICD-10-CM | POA: Diagnosis not present

## 2020-01-03 DIAGNOSIS — I2510 Atherosclerotic heart disease of native coronary artery without angina pectoris: Secondary | ICD-10-CM | POA: Diagnosis not present

## 2020-01-06 ENCOUNTER — Telehealth: Payer: Self-pay | Admitting: *Deleted

## 2020-01-06 DIAGNOSIS — I509 Heart failure, unspecified: Secondary | ICD-10-CM | POA: Diagnosis not present

## 2020-01-06 DIAGNOSIS — I4891 Unspecified atrial fibrillation: Secondary | ICD-10-CM | POA: Diagnosis not present

## 2020-01-06 DIAGNOSIS — Z905 Acquired absence of kidney: Secondary | ICD-10-CM | POA: Diagnosis not present

## 2020-01-06 DIAGNOSIS — Z7951 Long term (current) use of inhaled steroids: Secondary | ICD-10-CM | POA: Diagnosis not present

## 2020-01-06 DIAGNOSIS — N189 Chronic kidney disease, unspecified: Secondary | ICD-10-CM | POA: Diagnosis not present

## 2020-01-06 DIAGNOSIS — I2510 Atherosclerotic heart disease of native coronary artery without angina pectoris: Secondary | ICD-10-CM | POA: Diagnosis not present

## 2020-01-06 NOTE — Telephone Encounter (Signed)
Pt aware of lab results and weight is down 10 lbs and edema is much improved Pt has f/u visit with Cecilie Kicks NP on Friday 01/09/20

## 2020-01-06 NOTE — Telephone Encounter (Signed)
-----   Message from Freada Bergeron, MD sent at 01/02/2020  9:38 AM EDT ----- Electrolytes and renal function are stable. How is her LE edema? Has she managed to lose the 10lbs of fluid?

## 2020-01-09 DIAGNOSIS — N189 Chronic kidney disease, unspecified: Secondary | ICD-10-CM | POA: Diagnosis not present

## 2020-01-09 DIAGNOSIS — I509 Heart failure, unspecified: Secondary | ICD-10-CM | POA: Diagnosis not present

## 2020-01-09 DIAGNOSIS — Z905 Acquired absence of kidney: Secondary | ICD-10-CM | POA: Diagnosis not present

## 2020-01-09 DIAGNOSIS — I4891 Unspecified atrial fibrillation: Secondary | ICD-10-CM | POA: Diagnosis not present

## 2020-01-09 DIAGNOSIS — Z7951 Long term (current) use of inhaled steroids: Secondary | ICD-10-CM | POA: Diagnosis not present

## 2020-01-09 DIAGNOSIS — I2510 Atherosclerotic heart disease of native coronary artery without angina pectoris: Secondary | ICD-10-CM | POA: Diagnosis not present

## 2020-01-09 NOTE — Progress Notes (Deleted)
Cardiology Office Note   Date:  01/09/2020   ID:  [name], DOB [birth year], MRN [id number]  PCP:  McDiarmid, Blane Ohara, MD  Cardiologist:  Dr. [name]    EP:  Dr. Lovena Le  No chief complaint on file.     History of Present Illness: [name] is a 85 y.o. female who presents for ***  hx of CAD s/p RCA PCI, persistent Afib, symptomatic sinus node dysfunction, COPD, tachy-brady syndrome s/p PPM insertion, chronic diastolic HF, HTN, CKD stage 4 s/p nephrectomy who presents to clinic for 12lbs weight gain after dietary indiscretion.  Last saw Cecilie Kicks, NP on 12/12/19 after she presented with 5lbs weight fain with worsening SOB and HR 110-120s in the setting of dietary indiscretion. She was instructed by DOD at that time to increase her lasix to 40mg  daily with repeat BMET. Her BB was also increased due to Afib with RVR. She was doing well until she ate BBQ. Her home health nurse called today and stated the patient had worsening LE swelling, 10-12lbs weight gain, and associated SOB. Took lasix 20mg  PO yesterday without improvement. She is now being evaluated in clinic for further management.  Patient admits to eating a lot of salt a couple of weeks ago at [facility]. Has been gaining fluid over the course of 2 weeks with about 12lbs weight gain. She states she has a hard time being compliant with a low salt diet. Has been taking her medications as prescribed. Has some increase in her chronic SOB. No fevers, chills, bleeding issues.  Last echo 08/2019 showed LVEF 50-55%, severely dilated LA, mild to mod MR, mod TR, mild AS.  0-12 lb wt gain, increase edema and SOB lasix 50 mg X 3 days then 20 mg  On eliqis 2.5 mg daily.    Past Medical History:  Diagnosis Date  . Abnormal mammogram, unspecified 08/23/2010   Followup imaging reassuring.   . Acute appendicitis with rupture   . Acute on chronic diastolic heart failure ([place]) 10/16/2017  . ADENOMATOUS COLONIC POLYP 03/01/2003   Qualifier:  Diagnosis of  By: McDiarmid MD, Sherren Mocha Multiple benign polyps of cecum, ascending, transverse and sigmoid colon by 8/09 colonoscopy by Dr Cristina Gong  Colonoscopy by Dr Cristina Gong for iron-deficiency anemia on 04/27/2010 showed three sessile polyps that were in ascending (3 mm x 9 mm), transverse (4 mm), and cecum (3 mm).  All three were tubular adenomas that were negative for high grade dysplasia or malignancy on pathology. Dr Cristina Gong called the polpys benign and not requiring follow-up in view of the patients age.    . Adrenal adenoma    Incidentaloma  . AF (paroxysmal atrial fibrillation) ([place]) 11/11/2010   Hospitalization (9/8-9/10, Dr Daneen Schick, III, Cardiology) for Paroxysmal Atrial Fibrillation with RVR and anginal pain secondary to demand/supply mismatch in setting of RVR with known circumflex artery branch disease.    Marland Kitchen AKI (acute kidney injury) ([place])   . ANXIETY 04/27/2006   Qualifier: Diagnosis of  By: McDiarmid MD, Sherren Mocha    . Benign essential tremor 02/04/2016  . Candidiasis of the esophagus 11/26/2007  . CAP (community acquired pneumonia) 02/25/2015  . Cataract 2013   Bilateral   . Cellulitis of leg, right 10/01/2012  . Cellulitis of right lower extremity   . Chest pain with moderate risk of acute coronary syndrome 05/21/2015  . Chronic kidney disease (CKD), stage III (moderate) (HCC)   . Concussion with loss of consciousness   . COPD 04/27/2006  Qualifier: Diagnosis of  By: McDiarmid MD, Sherren Mocha    . COPD exacerbation ([place])   . COPD, severe   . Decreased functional mobility and endurance 03/17/2015  . Dementia [facility]), possible Alzhemier's type 10/26/2012   Montreal Cognitive Assessment (25 out of 30, points off in visuospatial and and short-term memory).  Independent in iADLs and ADLs.    . Diastolic heart failure ([place]) 07/30/2015  . DISC WITH RADICULOPATHY 04/27/2006   Qualifier: Diagnosis of  By: McDiarmid MD, Sherren Mocha    . Dyspnea   . EDEMA-LEGS,DUE TO VENOUS OBSTRUCT. 04/27/2006   Qualifier:  Diagnosis of  By: McDiarmid MD, Sherren Mocha    . Fall 10/27/2016  . Fracture of 5th metatarsal, Right, avulsion fx of tip styloid 11/09/2017  . GERD (gastroesophageal reflux disease) 11/08/2015  . 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 Normal uric acid serum level at time of attack.    . Hand trauma, right, initial encounter 03/20/2018  . HERNIA, HIATAL, NONCONGENITAL 04/27/2006   Qualifier: Diagnosis of  By: McDiarmid MD, Sherren Mocha    . High risk medications (not anticoagulants) long-term use 03/05/2012  . History of Hemorrhoids 04/27/2006   Qualifier: Diagnosis of  By: McDiarmid MD, Sherren Mocha    . History of iron deficiency 01/16/2015  . History of Iron deficiency anemia due to chronic GI blood loss 08/05/2010   Dr Cristina Gong (GI) has evaluated with EGD, colonoscopy, and video capsular endoscopy in 2011 & 2012.  All have been unrevealing as to an origin of IDA.  OV with Dr Cristina Gong (10/28/10) assessment of blood in stool per hemoccult and GER. Hbg 12.1 g/dL, MCV 91.8, Ferritin 30 ng/mL. Patient taking on ferrous sulfate tab daily.   EGD on 03/06/12 by Dr Cristina Gong for IDA non-obstructing Schatzki's ring at [place]  . Hx of colonoscopy with polypectomy 04/27/2010   Dr Cristina Gong found three  tubular adenomas each less than 10 mm size  . Hypertension   . Hypotension 07/09/2015  . Impaired mobility and ADLs 09/01/2017  . Iron deficiency anemia 08/05/2010   Dr Cristina Gong (GI) has evaluated with EGD, colonoscopy, and video capsular endoscopy in 2011 & 2012.  All have been unrevealing as to an origin of IDA.  OV with Dr Cristina Gong (10/28/10) assessment of blood in stool per hemoccult and GER. Hbg 12.1 g/dL, MCV 91.8, Ferritin 30 ng/mL. Patient taking on ferrous sulfate tab daily.   EGD on 03/06/12 by Dr Cristina Gong for IDA non-obstructing Schatzki's ring at Gastroesophageal junction, otherwise normal esophagus and stomach.     . Laceration of arm, unspecified laterality,  initial encounter 11/03/2017  . Left leg cellulitis   . Left Metatarsal fracture, 5th, avulsion styloid 12/29/2017  . Leg cramps 05/29/2012  . Low back pain   . Lumbar herniated disc    History of HNP L4/5 in 2003  . Macular degeneration, bilateral 10/04/2010   Right eye is wet MD, the other is dry macular degeneration (ARMD). Pt undergoing some form of vascular endothelial growth factor inhibition intraocular therapy.    . Mild cognitive impairment 10/26/2012   (10/25/12) Failed MiniCog screen  . MUSCLE CRAMPS 03/11/2010   Qualifier: Diagnosis of  By: McDiarmid MD, Sherren Mocha    . Muscle spasm of back 09/24/2013  . Myocardial infarct, old   . Nocturia 10/28/2016  . Numbness and tingling in hands 07/21/2011  . Pacemaker    MDT  . Paroxysmal atrial fibrillation (HCC)   .  Persistent atrial fibrillation ([place]) 05/31/2014  . PREDIABETES 09/11/2007   Qualifier: Diagnosis of  By: McDiarmid MD, Sherren Mocha    . Retinal hemorrhage of left eye 06/2010  . RHINITIS, ALLERGIC 04/27/2006   Qualifier: Diagnosis of  By: McDiarmid MD, Sherren Mocha    . SCHATZKI'S RING, HX OF 11/26/2007   Qualifier: Diagnosis of  By: McDiarmid MD, Sherren Mocha  An EGD was performed by Dr Cristina Gong on 04/27/2010 for iron deficiency anemia. There was a a transient hiatal hernia with Schatzki's ring. Stomach and duodenum were normal. EGD on 03/06/12 by Dr Cristina Gong for IDA non-obstructing Schatzki's ring at Gastroesophageal junction, otherwise normal esophagus and stomach.    . Seborrheic keratosis, right anterior thigh 12/12/2013  . Shoulder pain, left 01/09/2014  . SICK SINUS SYNDROME 04/27/2006   S/P dual chamber pacemaker placement by Dr Osie Cheeks (EPS-Card) with pacemaker lead extraction & reinsertion - 07/25/2003  Hospitalization (9/8-9/10, Dr Daneen Schick, III, Cardiology) for Paroxysmal Atrial Fibrillation with RVR and anginal pain secondary to demand/supply mismatch in setting of RVR with known circumflex artery branch disease.    . Sick sinus syndrome with  tachycardia ([place])    MDT  . Soft tissue injury of foot 05/03/2011  . Solar lentigo 06/15/2012  . Solitary kidney, acquired 05/17/2010   Surgical removal for transitional cell cancer by Tresa Endo, MD ([place]). Surveillance cystoscopy by Dr Alinda Money [facility]) on 10/19/12 without evidence of cystoscopic recurrence. Recommend RTC one year for cystoscopy.   Marland Kitchen Spinal stenosis, lumbar   . Transitional cell carcinoma of ureter, history   . Traumatic ecchymosis of left foot 12/07/2017  . Trigger point with neck pain 10/26/2018  . Urge incontinence 12/13/2011   Diagnosed in 10/2011 by Dr Bjorn Loser (Urology)   . Venous stasis ulcer of ankle, left ([place]) 10/24/2014  . VENTRICULAR HYPERTROPHY, LEFT 08/28/2008   Qualifier: Diagnosis of  By: McDiarmid MD, Sherren Mocha    . VITAMIN B12 DEFICIENCY 10/07/2009   Qualifier: Diagnosis of  By: McDiarmid MD, Sherren Mocha  Dx based on a post-TKR anemia work-up Low normal serum B12 with high Methylmalonic acid and homocysteine level  Vit B12 serum level (10/28/10) > 1500 pg/mL   . Vitamin D deficiency 11/02/2010   Serum vitamin D 25(OH) = 10.9 ng/mL (30 -100) on 10/28/10 c/w Vitamin D deficiency.     . Wound of left foot 12/29/2017    Past Surgical History:  Procedure Laterality Date  . BREAST SURGERY     breast reduction  . CARDIOVERSION N/A 10/20/2017   Procedure: CARDIOVERSION;  Surgeon: Josue Hector, MD;  Location: [facility] ENDOSCOPY;  Service: Cardiovascular;  Laterality: N/A;  . CHOLECYSTECTOMY    . CORONARY ANGIOPLASTY WITH STENT PLACEMENT  2010   BMS RCA, OM2 occluded  . CYSTOSCOPY  09/30/2015   No cystoscopic evidence of uroepithelial neoplasm.  Follow up surveillance cystoscopy in one year  . CYSTOSCOPY  10/13/2017   Dr Raynelle Bring [facility]rology)  . DUPUYTREN CONTRACTURE RELEASE Right 05/22/2014   Procedure: DUPUYTREN RELEASE AND REPAIR AS NECESSARY RIGHT RING FINGER AND MIDDLE FINGER;  Surgeon: Roseanne Kaufman, MD;  Location: [place];  Service: Orthopedics;   Laterality: Right;  . ESOPHAGOGASTRODUODENOSCOPY  04/27/2010   Dr Cristina Gong - found transient H/H & Schatzki's ring  . ESOPHAGOGASTRODUODENOSCOPY ENDOSCOPY  03/06/2012   Dr Cristina Gong - found non-obstuctive Schatzki's ring at [employer] o/w normal EGD.   Marland Kitchen EYE SURGERY     bilateral cataracts  . KNEE ARTHROSCOPY W/ SYNOVECTOMY  11/2009, left knee  Dr Wynelle Link  . NEPHRECTOMY  For transition cell cancer    Dr Tresa Endo, surgeon  . PACEMAKER GENERATOR CHANGE N/A 11/12/2012   Procedure: PACEMAKER GENERATOR CHANGE;  Surgeon: Evans Lance, MD; [employer] [facility] dual-chamber pacemaker serial number [id number]; Laterality: Right  . PACEMAKER INSERTION  2005   Dr Doreatha Lew  . PACEMAKER LEAD REMOVAL  2005   Removal and reinsertion of atrial and ventricular leads due to migration  . REPLACEMENT TOTAL KNEE  2009, Right knee   Dr Wynelle Link  . REPLACEMENT TOTAL KNEE  05/2009, Left knee   Dr Wynelle Link  . TONSILLECTOMY    . TRIGGER FINGER RELEASE Right 05/22/2014   Procedure: RIGHT HAND A-1 PULLEY RELEASE ;  Surgeon: Roseanne Kaufman, MD;  Location: [place];  Service: Orthopedics;  Laterality: Right;     Current Outpatient Medications  Medication Sig Dispense Refill  . albuterol (PROVENTIL) (2.5 MG/3ML) 0.083% nebulizer solution USE 1 VIAL VIA NEBULIZER EVERY 4 HOURS AS NEEDED FOR WHEEZING OR SHORTNESS OF BREATH 360 mL 5  . albuterol (VENTOLIN HFA) 108 (90 Base) MCG/ACT inhaler Inhale 2 puffs into the lungs every 4 (four) hours as needed for wheezing or shortness of breath. 18 g 5  . atorvastatin (LIPITOR) 20 MG tablet TAKE 1 TABLET BY MOUTH DAILY 90 tablet 3  . ELIQUIS 2.5 MG TABS tablet TAKE 1 TABLET(2.5 MG) BY MOUTH TWICE DAILY 60 tablet 5  . fluticasone (FLONASE) 50 MCG/ACT nasal spray SHAKE LIQUID AND USE 2 SPRAYS IN EACH NOSTRIL DAILY 48 g 3  . Fluticasone-Umeclidin-Vilant (TRELEGY ELLIPTA) 100-62.5-25 MCG/INH AEPB Inhale 1 puff into the lungs daily. 60 each 5  . furosemide (LASIX) 40 MG tablet Take one tablet  (40mg ) by mouth daily for 3 days, then take 1/2 tablet (20 mg) by mouth daily until all swelling/fluid is off, then 1/2 tablet (20 mg) by mouth only as needed thereafter 45 tablet 3  . metoprolol succinate (TOPROL XL) 25 MG 24 hr tablet Take 1.5 tablets (37.5 mg total) by mouth daily. 135 tablet 3  . nitroGLYCERIN (NITROSTAT) 0.4 MG SL tablet Place 1 tablet (0.4 mg total) under the tongue every 5 (five) minutes as needed for chest pain (x 3 tabs). 25 tablet PRN  . omeprazole (PRILOSEC) 20 MG capsule TAKE 1 CAPSULE(20 MG) BY MOUTH DAILY 90 capsule 3  . Spacer/Aero-Holding Chambers (AEROCHAMBER MV) inhaler Use as instructed 1 each 0  . traMADol (ULTRAM) 50 MG tablet TAKE 1 TABLET BY MOUTH EVERY 6 HOURS AS NEEDED FOR PAIN 30 tablet 2   No current facility-administered medications for this visit.    Allergies:   Baclofen, Codeine phosphate, Hydrochlorothiazide, Montelukast sodium, and Norco [hydrocodone-acetaminophen]    Social History:  The patient  reports that she quit smoking about 10 years ago. Her smoking use included cigarettes. She smoked 1.00 pack per day. She has never used smokeless tobacco. She reports current alcohol use of about 13.0 standard drinks of alcohol per week. She reports that she does not use drugs.   Family History:  The patient's ***family history includes Dementia in her mother; Heart attack in her father; Heart disease in her father.    ROS:  General:no colds or fevers, no weight changes Skin:no rashes or ulcers HEENT:no blurred vision, no congestion CV:see HPI PUL:see HPI GI:no diarrhea constipation or melena, no indigestion GU:no hematuria, no dysuria MS:no joint pain, no claudication Neuro:no syncope, no lightheadedness Endo:no diabetes, no thyroid disease Wt Readings from Last 3 Encounters:  12/25/19 179 lb (81.2  kg)  12/12/19 172 lb 9.6 oz (78.3 kg)  12/03/19 167 lb 9.6 oz (76 kg)     PHYSICAL EXAM: VS:  There were no vitals taken for this visit. , BMI  There is no height or weight on file to calculate BMI. General:Pleasant affect, NAD Skin:Warm and dry, brisk capillary refill HEENT:normocephalic, sclera clear, mucus membranes moist Neck:supple, no JVD, no bruits  Heart:S1S2 RRR without murmur, gallup, rub or click Lungs:clear without rales, rhonchi, or wheezes FEO:FHQR, non tender, + BS, do not palpate liver spleen or masses Ext:no lower ext edema, 2+ pedal pulses, 2+ radial pulses Neuro:alert and oriented, MAE, follows commands, + facial symmetry    EKG:  EKG is ordered today. The ekg ordered today demonstrates ***   Recent Labs: 07/02/2019: TSH 3.110 10/03/2019: ALT 13; Hemoglobin 11.2; Platelets 246 01/01/2020: BUN 26; Creatinine, Ser 1.66; Potassium 4.5; Sodium 143    Lipid Panel    Component Value Date/Time   CHOL 130 08/11/2016 1101   TRIG 106 08/11/2016 1101   HDL 37 (L) 08/11/2016 1101   CHOLHDL 3.5 08/11/2016 1101   CHOLHDL 3.4 07/30/2015 1231   VLDL 29 07/30/2015 1231   LDLCALC 72 08/11/2016 1101   LDLDIRECT 59 11/29/2018 1310   LDLDIRECT 74 05/08/2014 1110       Other studies Reviewed: Additional studies/ records that were reviewed today include: ***.   ASSESSMENT AND PLAN:  1.  ***   Current medicines are reviewed with the patient today.  The patient Has no concerns regarding medicines.  The following changes have been made:  See above Labs/ tests ordered today include:see above  Disposition:   FU:  see above  Signed, Cecilie Kicks, NP  01/09/2020 9:09 PM    [facility], [place], [place], [place] Phone: [phone]; Fax: [phone]

## 2020-01-10 ENCOUNTER — Ambulatory Visit: Payer: Medicare Other | Admitting: Cardiology

## 2020-01-10 ENCOUNTER — Telehealth: Payer: Self-pay | Admitting: Emergency Medicine

## 2020-01-10 ENCOUNTER — Telehealth: Payer: Self-pay | Admitting: Family Medicine

## 2020-01-10 ENCOUNTER — Other Ambulatory Visit: Payer: Self-pay | Admitting: Internal Medicine

## 2020-01-10 NOTE — Telephone Encounter (Signed)
RB [name] seen 12/11/2018 and was instructed to f/u in 92mo. No pending OV.   Spoke to  [name]'s daughter, [name] [facility]). [name] stated that [name]'s breathing is very labored. She is having to stop and rest every 61ft due to sob and wheezing. Sx have worsen over the past month.  [name] is not taking albuterol nebulizer solution as prescribed.  She is using trelegy with no relief in sx.  She has had both covid vaccines and flu shot.   MW, please advise as Dr. Lamonte Sakai is unavailable.   Current Outpatient Medications on File Prior to Visit  Medication Sig Dispense Refill  . albuterol (PROVENTIL) (2.5 MG/3ML) 0.083% nebulizer solution USE 1 VIAL VIA NEBULIZER EVERY 4 HOURS AS NEEDED FOR WHEEZING OR SHORTNESS OF BREATH 360 mL 5  . albuterol (VENTOLIN HFA) 108 (90 Base) MCG/ACT inhaler Inhale 2 puffs into the lungs every 4 (four) hours as needed for wheezing or shortness of breath. 18 g 5  . atorvastatin (LIPITOR) 20 MG tablet TAKE 1 TABLET BY MOUTH DAILY 90 tablet 3  . ELIQUIS 2.5 MG TABS tablet TAKE 1 TABLET(2.5 MG) BY MOUTH TWICE DAILY 60 tablet 5  . fluticasone (FLONASE) 50 MCG/ACT nasal spray SHAKE LIQUID AND USE 2 SPRAYS IN EACH NOSTRIL DAILY 48 g 3  . Fluticasone-Umeclidin-Vilant (TRELEGY ELLIPTA) 100-62.5-25 MCG/INH AEPB Inhale 1 puff into the lungs daily. 60 each 5  . furosemide (LASIX) 40 MG tablet Take one tablet (40mg ) by mouth daily for 3 days, then take 1/2 tablet (20 mg) by mouth daily until all swelling/fluid is off, then 1/2 tablet (20 mg) by mouth only as needed thereafter 45 tablet 3  . metoprolol succinate (TOPROL XL) 25 MG 24 hr tablet Take 1.5 tablets (37.5 mg total) by mouth daily. 135 tablet 3  . nitroGLYCERIN (NITROSTAT) 0.4 MG SL tablet Place 1 tablet (0.4 mg total) under the tongue every 5 (five) minutes as needed for chest pain (x 3 tabs). 25 tablet PRN  . omeprazole (PRILOSEC) 20 MG capsule TAKE 1 CAPSULE(20 MG) BY MOUTH DAILY 90 capsule 3  . Spacer/Aero-Holding Chambers  (AEROCHAMBER MV) inhaler Use as instructed 1 each 0  . traMADol (ULTRAM) 50 MG tablet TAKE 1 TABLET BY MOUTH EVERY 6 HOURS AS NEEDED FOR PAIN 30 tablet 2   No current facility-administered medications on file prior to visit.    Allergies  Allergen Reactions  . Baclofen Other (See Comments)    Confusion occurred with taking 3 at the same time  . Codeine Phosphate Nausea Only  . Hydrochlorothiazide Other (See Comments)    Possible acute gout or arthritis of wrist  . Montelukast Sodium Other (See Comments)    Reaction not recalled ??  Lebron Quam [Hydrocodone-Acetaminophen] Nausea And Vomiting

## 2020-01-10 NOTE — Telephone Encounter (Signed)
It may not be a primary lung problem as also has high pressures in the Left side of her heart by last echo so rec  Try neb up to every 4 hours  If getting relief, if not go to ER for evaluation   Check sats to be sure > 90% if not go to ER   Continue trelegy for now only one click each am (there is nothing stronger)  Set up f/u in office asap

## 2020-01-10 NOTE — Telephone Encounter (Signed)
Eliquis 2.5mg  refill request received. Patient is 86 years old, weight-81.2kg, Crea-1.66 on 01/01/2020 (CrCl-27.53ml/min), Diagnosis-Afib, and last seen by Dr. Johney Frame on 12/25/2019. Dose is appropriate based on dosing criteria. Will send in refill to requested pharmacy.

## 2020-01-10 NOTE — Telephone Encounter (Signed)
Spoke with daughter to see if I can get some more information but she preferred to speak with Dr. McDiarmid about a few things.  She said any day next week that he can spare a few minutes.  She did try making an appointment for patient in the next few weeks, but I told her that pcp is booked until after christmas.  She thinks that is too long.  Will ask pcp about this too.  Jona Zappone,CMA

## 2020-01-10 NOTE — Telephone Encounter (Signed)
Patients daughter [name] is calling and would like to know if Dr. McDiarmid could call her to discuss some things concerning the patient. She said it is not an emergency but also doesn't feel like it is anything she would need an appointment for. Please call leslie back at  [phone]

## 2020-01-10 NOTE — Telephone Encounter (Signed)
Called and spoke with patient. She schedule appointment for Monday with Eric Form NP for ASAP follow up   Called and spoke with daughter Both verbalized understanding of recommendations  Nothing further needed

## 2020-01-13 ENCOUNTER — Other Ambulatory Visit: Payer: Self-pay | Admitting: Family Medicine

## 2020-01-13 ENCOUNTER — Ambulatory Visit: Payer: BLUE CROSS/BLUE SHIELD | Admitting: Acute Care

## 2020-01-13 DIAGNOSIS — Z905 Acquired absence of kidney: Secondary | ICD-10-CM | POA: Diagnosis not present

## 2020-01-13 DIAGNOSIS — R54 Age-related physical debility: Secondary | ICD-10-CM | POA: Insufficient documentation

## 2020-01-13 DIAGNOSIS — I4891 Unspecified atrial fibrillation: Secondary | ICD-10-CM | POA: Diagnosis not present

## 2020-01-13 DIAGNOSIS — R262 Difficulty in walking, not elsewhere classified: Secondary | ICD-10-CM

## 2020-01-13 DIAGNOSIS — I2510 Atherosclerotic heart disease of native coronary artery without angina pectoris: Secondary | ICD-10-CM | POA: Diagnosis not present

## 2020-01-13 DIAGNOSIS — I509 Heart failure, unspecified: Secondary | ICD-10-CM | POA: Diagnosis not present

## 2020-01-13 DIAGNOSIS — N189 Chronic kidney disease, unspecified: Secondary | ICD-10-CM | POA: Diagnosis not present

## 2020-01-13 DIAGNOSIS — Z789 Other specified health status: Secondary | ICD-10-CM | POA: Insufficient documentation

## 2020-01-13 DIAGNOSIS — Z7951 Long term (current) use of inhaled steroids: Secondary | ICD-10-CM | POA: Diagnosis not present

## 2020-01-13 NOTE — Telephone Encounter (Signed)
I spoke with Mrs [name], [name], by phone.  She relates her concern that her mother has developed needs for assistance with ADLs.   [name] would like to discuss home (bodily) care options with Ms [name], [name].  Mrs [name] is having difficulty donning her graduated compression stockings. [name] is interested in obtaining wrapping compression stocking, e.g., Cirdaids Will fax a Rx for Circaids to "[facility]" for fitting for a Circaid.   Will schedule a home visit with Mrs [name] for after Thanksgiving.

## 2020-01-13 NOTE — Progress Notes (Signed)
Order for chronic care management assistance in identifying community resources for personal care services.

## 2020-01-14 ENCOUNTER — Telehealth: Payer: Self-pay | Admitting: *Deleted

## 2020-01-14 NOTE — Chronic Care Management (AMB) (Signed)
  Chronic Care Management   Outreach Note  01/14/2020 Name: [name] MRN: [id number] DOB: [birth year]  [name] is a 86 y.o. year old female who is a primary care patient of McDiarmid, Blane Ohara, MD. I reached out to [name] by phone today in response to a referral sent by Ms. [place] PCP, McDiarmid, Blane Ohara, MD.      An unsuccessful telephone outreach was attempted today. The patient was referred to the case management team for assistance with care management and care coordination.   Follow Up Plan: A HIPAA compliant phone message was left for the patient providing contact information and requesting a return call. The care management team will reach out to the patient again over the next 7 days. If patient returns call to provider office, please advise to call Lucas at [phone].  [facility]  Direct Dial: [phone]

## 2020-01-20 DIAGNOSIS — I4891 Unspecified atrial fibrillation: Secondary | ICD-10-CM | POA: Diagnosis not present

## 2020-01-20 DIAGNOSIS — Z7951 Long term (current) use of inhaled steroids: Secondary | ICD-10-CM | POA: Diagnosis not present

## 2020-01-20 DIAGNOSIS — Z905 Acquired absence of kidney: Secondary | ICD-10-CM | POA: Diagnosis not present

## 2020-01-20 DIAGNOSIS — N189 Chronic kidney disease, unspecified: Secondary | ICD-10-CM | POA: Diagnosis not present

## 2020-01-20 DIAGNOSIS — I2510 Atherosclerotic heart disease of native coronary artery without angina pectoris: Secondary | ICD-10-CM | POA: Diagnosis not present

## 2020-01-20 DIAGNOSIS — I509 Heart failure, unspecified: Secondary | ICD-10-CM | POA: Diagnosis not present

## 2020-01-21 ENCOUNTER — Telehealth: Payer: Self-pay

## 2020-01-21 DIAGNOSIS — N189 Chronic kidney disease, unspecified: Secondary | ICD-10-CM | POA: Diagnosis not present

## 2020-01-21 DIAGNOSIS — I2510 Atherosclerotic heart disease of native coronary artery without angina pectoris: Secondary | ICD-10-CM | POA: Diagnosis not present

## 2020-01-21 DIAGNOSIS — I4891 Unspecified atrial fibrillation: Secondary | ICD-10-CM | POA: Diagnosis not present

## 2020-01-21 DIAGNOSIS — Z905 Acquired absence of kidney: Secondary | ICD-10-CM | POA: Diagnosis not present

## 2020-01-21 DIAGNOSIS — I509 Heart failure, unspecified: Secondary | ICD-10-CM | POA: Diagnosis not present

## 2020-01-21 DIAGNOSIS — Z7951 Long term (current) use of inhaled steroids: Secondary | ICD-10-CM | POA: Diagnosis not present

## 2020-01-21 NOTE — Telephone Encounter (Signed)
Jinny Blossom, [facility] Nurse, calls nurse line to report decreased O2 sats for patient. Megan reports 87% at rest on room air, ambulatory low 80s. Jinny Blossom reports she gave patient a breathing treatment and increased sats to 90% on room air. Megan did not note any active distress in patient or other symptoms. Megan requesting a verbal order for mobile xray or if PCP feels she should be seen. Please advise.

## 2020-01-21 NOTE — Chronic Care Management (AMB) (Signed)
  Chronic Care Management   Note  01/21/2020 Name: ALYSE KATHAN MRN: [id number] DOB: 1923-05-02  [name] is a 86 y.o. year old female who is a primary care patient of McDiarmid, Blane Ohara, MD. I reached out to [name] by phone today in response to a referral sent by Ms. Celene Squibb [place]'s PCP, McDiarmid, Blane Ohara, MD.      Ms. Tvedt was given information about Chronic Care Management services today including:  1. CCM service includes personalized support from designated clinical staff supervised by her physician, including individualized plan of care and coordination with other care providers 2. 24/7 contact phone numbers for assistance for urgent and routine care needs. 3. Service will only be billed when office clinical staff spend 20 minutes or more in a month to coordinate care. 4. Only one practitioner may furnish and bill the service in a calendar month. 5. The patient may stop CCM services at any time (effective at the end of the month) by phone call to the office staff. 6. The patient will be responsible for cost sharing (co-pay) of up to 20% of the service fee (after annual deductible is met).  Patient daughter Skylor Hughson verbally agreed to assistance and services provided by embedded care coordination/care management team today.  Follow up plan: Telephone appointment with care management team member scheduled for:01/28/2020  [facility]  Direct Dial: [phone]

## 2020-01-21 NOTE — Telephone Encounter (Signed)
I spokw with Raelyn Mora and her Bethanne Ginger in response to [facility] from [facility] RN Megan's call to [facility] concerning Mrs Bass's SaO2 desaturation.   MRs Nevin is short of breath, though she says it is not worse than usual.  Denies increase cough, sputum.  No chest pain.  No fever. No URI symptoms.  Mrs Bultman was able to speak in full sentences, but her voice was breathy.   I left a vm with the [facility] RN, Jinny Blossom, to discuss her assessment of Mrs Rhett.

## 2020-01-22 NOTE — Telephone Encounter (Signed)
I spoke with [name], dgt of [name], by phone at Yuktha's home.  She reports her mother is feeling better today.  She is breathing better.  Lee plans to help her bathe today.  Her SaO2 rest RA 92%.  Mrs Mearns and and family know they can contact me should they have concerns over Thanksgiving.

## 2020-01-26 ENCOUNTER — Emergency Department (HOSPITAL_COMMUNITY): Payer: Medicare Other

## 2020-01-26 ENCOUNTER — Inpatient Hospital Stay (HOSPITAL_COMMUNITY)
Admission: EM | Admit: 2020-01-26 | Discharge: 2020-02-01 | DRG: 186 | Disposition: A | Discharge status: Hospice | Payer: Medicare Other | Attending: Family Medicine | Admitting: Family Medicine

## 2020-01-26 ENCOUNTER — Other Ambulatory Visit: Payer: Self-pay

## 2020-01-26 DIAGNOSIS — Z20822 Contact with and (suspected) exposure to covid-19: Secondary | ICD-10-CM | POA: Diagnosis present

## 2020-01-26 DIAGNOSIS — Z1611 Resistance to penicillins: Secondary | ICD-10-CM | POA: Diagnosis present

## 2020-01-26 DIAGNOSIS — N184 Chronic kidney disease, stage 4 (severe): Secondary | ICD-10-CM | POA: Diagnosis present

## 2020-01-26 DIAGNOSIS — J9601 Acute respiratory failure with hypoxia: Secondary | ICD-10-CM | POA: Diagnosis present

## 2020-01-26 DIAGNOSIS — D500 Iron deficiency anemia secondary to blood loss (chronic): Secondary | ICD-10-CM | POA: Diagnosis present

## 2020-01-26 DIAGNOSIS — R32 Unspecified urinary incontinence: Secondary | ICD-10-CM | POA: Diagnosis present

## 2020-01-26 DIAGNOSIS — Z79899 Other long term (current) drug therapy: Secondary | ICD-10-CM

## 2020-01-26 DIAGNOSIS — I2729 Other secondary pulmonary hypertension: Secondary | ICD-10-CM | POA: Diagnosis present

## 2020-01-26 DIAGNOSIS — Z8249 Family history of ischemic heart disease and other diseases of the circulatory system: Secondary | ICD-10-CM

## 2020-01-26 DIAGNOSIS — I2510 Atherosclerotic heart disease of native coronary artery without angina pectoris: Secondary | ICD-10-CM | POA: Diagnosis present

## 2020-01-26 DIAGNOSIS — R4182 Altered mental status, unspecified: Secondary | ICD-10-CM

## 2020-01-26 DIAGNOSIS — R0602 Shortness of breath: Secondary | ICD-10-CM | POA: Diagnosis not present

## 2020-01-26 DIAGNOSIS — R609 Edema, unspecified: Secondary | ICD-10-CM | POA: Diagnosis not present

## 2020-01-26 DIAGNOSIS — J432 Centrilobular emphysema: Secondary | ICD-10-CM | POA: Diagnosis not present

## 2020-01-26 DIAGNOSIS — Z7189 Other specified counseling: Secondary | ICD-10-CM

## 2020-01-26 DIAGNOSIS — I700 Atherosclerosis of aorta: Secondary | ICD-10-CM | POA: Diagnosis not present

## 2020-01-26 DIAGNOSIS — I4819 Other persistent atrial fibrillation: Secondary | ICD-10-CM | POA: Diagnosis present

## 2020-01-26 DIAGNOSIS — Z86010 Personal history of colonic polyps: Secondary | ICD-10-CM

## 2020-01-26 DIAGNOSIS — Z96653 Presence of artificial knee joint, bilateral: Secondary | ICD-10-CM | POA: Diagnosis present

## 2020-01-26 DIAGNOSIS — I83019 Varicose veins of right lower extremity with ulcer of unspecified site: Secondary | ICD-10-CM | POA: Diagnosis present

## 2020-01-26 DIAGNOSIS — I130 Hypertensive heart and chronic kidney disease with heart failure and stage 1 through stage 4 chronic kidney disease, or unspecified chronic kidney disease: Secondary | ICD-10-CM | POA: Diagnosis present

## 2020-01-26 DIAGNOSIS — J9811 Atelectasis: Secondary | ICD-10-CM | POA: Diagnosis not present

## 2020-01-26 DIAGNOSIS — D649 Anemia, unspecified: Secondary | ICD-10-CM

## 2020-01-26 DIAGNOSIS — R0902 Hypoxemia: Secondary | ICD-10-CM | POA: Diagnosis not present

## 2020-01-26 DIAGNOSIS — Z950 Presence of cardiac pacemaker: Secondary | ICD-10-CM | POA: Diagnosis present

## 2020-01-26 DIAGNOSIS — F05 Delirium due to known physiological condition: Secondary | ICD-10-CM | POA: Diagnosis present

## 2020-01-26 DIAGNOSIS — Z66 Do not resuscitate: Secondary | ICD-10-CM | POA: Diagnosis not present

## 2020-01-26 DIAGNOSIS — I495 Sick sinus syndrome: Secondary | ICD-10-CM | POA: Diagnosis present

## 2020-01-26 DIAGNOSIS — I5032 Chronic diastolic (congestive) heart failure: Secondary | ICD-10-CM | POA: Diagnosis present

## 2020-01-26 DIAGNOSIS — Z905 Acquired absence of kidney: Secondary | ICD-10-CM

## 2020-01-26 DIAGNOSIS — E7800 Pure hypercholesterolemia, unspecified: Secondary | ICD-10-CM | POA: Diagnosis present

## 2020-01-26 DIAGNOSIS — Z9842 Cataract extraction status, left eye: Secondary | ICD-10-CM

## 2020-01-26 DIAGNOSIS — E785 Hyperlipidemia, unspecified: Secondary | ICD-10-CM | POA: Diagnosis present

## 2020-01-26 DIAGNOSIS — Z885 Allergy status to narcotic agent status: Secondary | ICD-10-CM

## 2020-01-26 DIAGNOSIS — R2689 Other abnormalities of gait and mobility: Secondary | ICD-10-CM | POA: Diagnosis present

## 2020-01-26 DIAGNOSIS — R262 Difficulty in walking, not elsewhere classified: Secondary | ICD-10-CM | POA: Diagnosis present

## 2020-01-26 DIAGNOSIS — R7881 Bacteremia: Secondary | ICD-10-CM | POA: Diagnosis present

## 2020-01-26 DIAGNOSIS — B9620 Unspecified Escherichia coli [E. coli] as the cause of diseases classified elsewhere: Secondary | ICD-10-CM | POA: Diagnosis present

## 2020-01-26 DIAGNOSIS — Z515 Encounter for palliative care: Secondary | ICD-10-CM

## 2020-01-26 DIAGNOSIS — R069 Unspecified abnormalities of breathing: Secondary | ICD-10-CM | POA: Diagnosis not present

## 2020-01-26 DIAGNOSIS — R1900 Intra-abdominal and pelvic swelling, mass and lump, unspecified site: Secondary | ICD-10-CM | POA: Diagnosis present

## 2020-01-26 DIAGNOSIS — Z955 Presence of coronary angioplasty implant and graft: Secondary | ICD-10-CM

## 2020-01-26 DIAGNOSIS — Z8554 Personal history of malignant neoplasm of ureter: Secondary | ICD-10-CM

## 2020-01-26 DIAGNOSIS — R600 Localized edema: Secondary | ICD-10-CM

## 2020-01-26 DIAGNOSIS — J449 Chronic obstructive pulmonary disease, unspecified: Secondary | ICD-10-CM | POA: Diagnosis present

## 2020-01-26 DIAGNOSIS — K5900 Constipation, unspecified: Secondary | ICD-10-CM | POA: Diagnosis present

## 2020-01-26 DIAGNOSIS — I714 Abdominal aortic aneurysm, without rupture, unspecified: Secondary | ICD-10-CM | POA: Diagnosis present

## 2020-01-26 DIAGNOSIS — K5909 Other constipation: Secondary | ICD-10-CM | POA: Diagnosis present

## 2020-01-26 DIAGNOSIS — Z9049 Acquired absence of other specified parts of digestive tract: Secondary | ICD-10-CM

## 2020-01-26 DIAGNOSIS — I83029 Varicose veins of left lower extremity with ulcer of unspecified site: Secondary | ICD-10-CM | POA: Diagnosis present

## 2020-01-26 DIAGNOSIS — R54 Age-related physical debility: Secondary | ICD-10-CM | POA: Diagnosis present

## 2020-01-26 DIAGNOSIS — J90 Pleural effusion, not elsewhere classified: Secondary | ICD-10-CM | POA: Diagnosis not present

## 2020-01-26 DIAGNOSIS — I2781 Cor pulmonale (chronic): Secondary | ICD-10-CM | POA: Diagnosis present

## 2020-01-26 DIAGNOSIS — Z888 Allergy status to other drugs, medicaments and biological substances status: Secondary | ICD-10-CM

## 2020-01-26 DIAGNOSIS — D509 Iron deficiency anemia, unspecified: Secondary | ICD-10-CM | POA: Diagnosis present

## 2020-01-26 DIAGNOSIS — Z7901 Long term (current) use of anticoagulants: Secondary | ICD-10-CM

## 2020-01-26 DIAGNOSIS — F0390 Unspecified dementia without behavioral disturbance: Secondary | ICD-10-CM | POA: Diagnosis present

## 2020-01-26 DIAGNOSIS — Z87891 Personal history of nicotine dependence: Secondary | ICD-10-CM

## 2020-01-26 DIAGNOSIS — I252 Old myocardial infarction: Secondary | ICD-10-CM

## 2020-01-26 DIAGNOSIS — I4891 Unspecified atrial fibrillation: Secondary | ICD-10-CM | POA: Diagnosis present

## 2020-01-26 DIAGNOSIS — Z9841 Cataract extraction status, right eye: Secondary | ICD-10-CM

## 2020-01-26 DIAGNOSIS — R52 Pain, unspecified: Secondary | ICD-10-CM | POA: Diagnosis not present

## 2020-01-26 LAB — CBC WITH DIFFERENTIAL/PLATELET
Abs Immature Granulocytes: 0.04 10*3/uL (ref 0.00–0.07)
Basophils Absolute: 0 10*3/uL (ref 0.0–0.1)
Basophils Relative: 0 %
Eosinophils Absolute: 0.1 10*3/uL (ref 0.0–0.5)
Eosinophils Relative: 1 %
HCT: 31.3 % — ABNORMAL LOW (ref 36.0–46.0)
Hemoglobin: 8.6 g/dL — ABNORMAL LOW (ref 12.0–15.0)
Immature Granulocytes: 1 %
Lymphocytes Relative: 9 %
Lymphs Abs: 0.5 10*3/uL — ABNORMAL LOW (ref 0.7–4.0)
MCH: 26.1 pg (ref 26.0–34.0)
MCHC: 27.5 g/dL — ABNORMAL LOW (ref 30.0–36.0)
MCV: 94.8 fL (ref 80.0–100.0)
Monocytes Absolute: 0.8 10*3/uL (ref 0.1–1.0)
Monocytes Relative: 13 %
Neutro Abs: 4.7 10*3/uL (ref 1.7–7.7)
Neutrophils Relative %: 76 %
Platelets: 209 10*3/uL (ref 150–400)
RBC: 3.3 MIL/uL — ABNORMAL LOW (ref 3.87–5.11)
RDW: 16.1 % — ABNORMAL HIGH (ref 11.5–15.5)
WBC: 6.1 10*3/uL (ref 4.0–10.5)
nRBC: 0 % (ref 0.0–0.2)

## 2020-01-26 LAB — TROPONIN I (HIGH SENSITIVITY)
Troponin I (High Sensitivity): 10 ng/L (ref <18)
Troponin I (High Sensitivity): 10 ng/L (ref <18)

## 2020-01-26 LAB — BASIC METABOLIC PANEL
Anion gap: 11 (ref 5–15)
BUN: 34 mg/dL — ABNORMAL HIGH (ref 8–23)
CO2: 24 mmol/L (ref 22–32)
Calcium: 8.5 mg/dL — ABNORMAL LOW (ref 8.9–10.3)
Chloride: 107 mmol/L (ref 98–111)
Creatinine, Ser: 1.81 mg/dL — ABNORMAL HIGH (ref 0.44–1.00)
GFR, Estimated: 26 mL/min — ABNORMAL LOW (ref >60)
Glucose, Bld: 129 mg/dL — ABNORMAL HIGH (ref 70–99)
Potassium: 4.3 mmol/L (ref 3.5–5.1)
Sodium: 142 mmol/L (ref 135–145)

## 2020-01-26 LAB — LACTIC ACID, PLASMA
Lactic Acid, Venous: 1.6 mmol/L (ref 0.5–1.9)
Lactic Acid, Venous: 1.7 mmol/L (ref 0.5–1.9)

## 2020-01-26 LAB — URINALYSIS, ROUTINE W REFLEX MICROSCOPIC
Bacteria, UA: NONE SEEN
Bilirubin Urine: NEGATIVE
Glucose, UA: NEGATIVE mg/dL
Ketones, ur: NEGATIVE mg/dL
Leukocytes,Ua: NEGATIVE
Nitrite: NEGATIVE
Protein, ur: NEGATIVE mg/dL
Specific Gravity, Urine: 1.02 (ref 1.005–1.030)
pH: 5 (ref 5.0–8.0)

## 2020-01-26 LAB — RESP PANEL BY RT-PCR (FLU A&B, COVID) ARPGX2
Influenza A by PCR: NEGATIVE
Influenza B by PCR: NEGATIVE
SARS Coronavirus 2 by RT PCR: NEGATIVE

## 2020-01-26 LAB — BRAIN NATRIURETIC PEPTIDE: B Natriuretic Peptide: 924 pg/mL — ABNORMAL HIGH (ref 0.0–100.0)

## 2020-01-26 LAB — POC OCCULT BLOOD, ED: Fecal Occult Bld: POSITIVE — AB

## 2020-01-26 MED ORDER — APIXABAN 2.5 MG PO TABS
2.5000 mg | ORAL_TABLET | Freq: Two times a day (BID) | ORAL | Status: DC
  Administered 2020-01-26 – 2020-02-01 (×12): 2.5 mg via ORAL
  Filled 2020-01-26 (×12): qty 1

## 2020-01-26 MED ORDER — ATORVASTATIN CALCIUM 10 MG PO TABS
20.0000 mg | ORAL_TABLET | Freq: Every day | ORAL | Status: DC
  Administered 2020-01-27 – 2020-02-01 (×6): 20 mg via ORAL
  Filled 2020-01-26 (×6): qty 2

## 2020-01-26 MED ORDER — FUROSEMIDE 40 MG PO TABS
40.0000 mg | ORAL_TABLET | Freq: Every day | ORAL | Status: DC
  Administered 2020-01-27 – 2020-02-01 (×6): 40 mg via ORAL
  Filled 2020-01-26 (×6): qty 1

## 2020-01-26 MED ORDER — METOPROLOL SUCCINATE ER 25 MG PO TB24
37.5000 mg | ORAL_TABLET | Freq: Every day | ORAL | Status: DC
  Administered 2020-01-27 – 2020-01-30 (×4): 37.5 mg via ORAL
  Filled 2020-01-26 (×4): qty 2

## 2020-01-26 MED ORDER — PANTOPRAZOLE SODIUM 40 MG PO TBEC
40.0000 mg | DELAYED_RELEASE_TABLET | Freq: Every day | ORAL | Status: DC
  Administered 2020-01-26 – 2020-02-01 (×7): 40 mg via ORAL
  Filled 2020-01-26 (×7): qty 1

## 2020-01-26 MED ORDER — FLUTICASONE PROPIONATE 50 MCG/ACT NA SUSP
2.0000 | Freq: Every day | NASAL | Status: DC | PRN
  Filled 2020-01-26: qty 16

## 2020-01-26 MED ORDER — UMECLIDINIUM BROMIDE 62.5 MCG/INH IN AEPB
1.0000 | INHALATION_SPRAY | Freq: Every day | RESPIRATORY_TRACT | Status: DC
  Administered 2020-01-29 – 2020-02-01 (×4): 1 via RESPIRATORY_TRACT
  Filled 2020-01-26: qty 7

## 2020-01-26 MED ORDER — FLUTICASONE FUROATE-VILANTEROL 100-25 MCG/INH IN AEPB
1.0000 | INHALATION_SPRAY | Freq: Every day | RESPIRATORY_TRACT | Status: DC
  Administered 2020-01-29 – 2020-02-01 (×4): 1 via RESPIRATORY_TRACT
  Filled 2020-01-26: qty 28

## 2020-01-26 MED ORDER — ALBUTEROL SULFATE (2.5 MG/3ML) 0.083% IN NEBU
2.5000 mg | INHALATION_SOLUTION | RESPIRATORY_TRACT | Status: DC | PRN
  Administered 2020-01-27 – 2020-01-31 (×3): 2.5 mg via RESPIRATORY_TRACT
  Filled 2020-01-26 (×4): qty 3

## 2020-01-26 MED ORDER — FLUTICASONE-UMECLIDIN-VILANT 100-62.5-25 MCG/INH IN AEPB
1.0000 | INHALATION_SPRAY | Freq: Every day | RESPIRATORY_TRACT | Status: DC
  Filled 2020-01-26: qty 1

## 2020-01-26 MED ORDER — ALBUTEROL SULFATE HFA 108 (90 BASE) MCG/ACT IN AERS: 2.0000 | INHALATION_SPRAY | RESPIRATORY_TRACT | Status: DC | PRN

## 2020-01-26 NOTE — ED Notes (Signed)
Pt transported to CT ?

## 2020-01-26 NOTE — ED Triage Notes (Signed)
Pt to ED today form home for increased SOB from baseline and family concerned for UTI.  This morning pt woke up and o2 was in the low 80s. Family administered neb with improvement. EMS arrived pt desating when ambulating to stretcher to 88%. Placed pt on O2 via [place].  Noted bilateral lower leg edema.

## 2020-01-26 NOTE — ED Provider Notes (Signed)
[facility] EMERGENCY DEPARTMENT Provider Note   CSN: [id number] Arrival date & time: 01/26/20  1308     History Chief Complaint  Patient presents with  . Shortness of Breath    [name] is a 86 y.o. female.  Pt presents to the ED today with sob.  The pt has a hx of COPD and CHF.  The family has a home spo2 monitor and her O2 sats were in the 80s today.  Pt has increasing sob with exertion.  She did get a neb prior to EMS transport and is feeling better.  EMS put pt on 2L because she desaturated with movement.  Pt's daughter said she's been significantly declining over the past month.  She does very little other than sit in her recliner.  She gets sob walking to the bathroom.  She no longer goes up the steps to her bedroom.  Pt's daughter said she sleeps most of the day.  She is more confused than normal as well.  Pt's daughter said pt has been calling to cancel her recent doctor appointments without her knowing.         Past Medical History:  Diagnosis Date  . Abnormal mammogram, unspecified 08/23/2010   Followup imaging reassuring.   . Acute appendicitis with rupture   . Acute on chronic diastolic heart failure ([place]) 10/16/2017  . ADENOMATOUS COLONIC POLYP 03/01/2003   Qualifier: Diagnosis of  By: McDiarmid MD, Sherren Mocha Multiple benign polyps of cecum, ascending, transverse and sigmoid colon by 8/09 colonoscopy by Dr Cristina Gong  Colonoscopy by Dr Cristina Gong for iron-deficiency anemia on 04/27/2010 showed three sessile polyps that were in ascending (3 mm x 9 mm), transverse (4 mm), and cecum (3 mm).  All three were tubular adenomas that were negative for high grade dysplasia or malignancy on pathology. Dr Cristina Gong called the polpys benign and not requiring follow-up in view of the patients age.    . Adrenal adenoma    Incidentaloma  . AF (paroxysmal atrial fibrillation) ([place]) 11/11/2010   Hospitalization (9/8-9/10, Dr Daneen Schick, III, Cardiology) for Paroxysmal Atrial  Fibrillation with RVR and anginal pain secondary to demand/supply mismatch in setting of RVR with known circumflex artery branch disease.    Marland Kitchen AKI (acute kidney injury) ([place])   . ANXIETY 04/27/2006   Qualifier: Diagnosis of  By: McDiarmid MD, Sherren Mocha    . Benign essential tremor 02/04/2016  . Candidiasis of the esophagus 11/26/2007  . CAP (community acquired pneumonia) 02/25/2015  . Cataract 2013   Bilateral   . Cellulitis of leg, right 10/01/2012  . Cellulitis of right lower extremity   . Chest pain with moderate risk of acute coronary syndrome 05/21/2015  . Chronic kidney disease (CKD), stage III (moderate) (HCC)   . Concussion with loss of consciousness   . COPD 04/27/2006   Qualifier: Diagnosis of  By: McDiarmid MD, Sherren Mocha    . COPD exacerbation ([place])   . COPD, severe   . Decreased functional mobility and endurance 03/17/2015  . Dementia [facility]), possible Alzhemier's type 10/26/2012   Montreal Cognitive Assessment (25 out of 30, points off in visuospatial and and short-term memory).  Independent in iADLs and ADLs.    . Diastolic heart failure ([place]) 07/30/2015  . DISC WITH RADICULOPATHY 04/27/2006   Qualifier: Diagnosis of  By: McDiarmid MD, Sherren Mocha    . Dyspnea   . EDEMA-LEGS,DUE TO VENOUS OBSTRUCT. 04/27/2006   Qualifier: Diagnosis of  By: McDiarmid MD, Sherren Mocha    . Fall  10/27/2016  . Fracture of 5th metatarsal, Right, avulsion fx of tip styloid 11/09/2017  . GERD (gastroesophageal reflux disease) 11/08/2015  . 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 Normal uric acid serum level at time of attack.    . Hand trauma, right, initial encounter 03/20/2018  . HERNIA, HIATAL, NONCONGENITAL 04/27/2006   Qualifier: Diagnosis of  By: McDiarmid MD, Sherren Mocha    . High risk medications (not anticoagulants) long-term use 03/05/2012  . History of Hemorrhoids 04/27/2006   Qualifier: Diagnosis of  By: McDiarmid MD, Sherren Mocha    . History of iron deficiency  01/16/2015  . History of Iron deficiency anemia due to chronic GI blood loss 08/05/2010   Dr Cristina Gong (GI) has evaluated with EGD, colonoscopy, and video capsular endoscopy in 2011 & 2012.  All have been unrevealing as to an origin of IDA.  OV with Dr Cristina Gong (10/28/10) assessment of blood in stool per hemoccult and GER. Hbg 12.1 g/dL, MCV 91.8, Ferritin 30 ng/mL. Patient taking on ferrous sulfate tab daily.   EGD on 03/06/12 by Dr Cristina Gong for IDA non-obstructing Schatzki's ring at [facility]  . Hx of colonoscopy with polypectomy 04/27/2010   Dr Cristina Gong found three  tubular adenomas each less than 10 mm size  . Hypertension   . Hypotension 07/09/2015  . Impaired mobility and ADLs 09/01/2017  . Iron deficiency anemia 08/05/2010   Dr Cristina Gong (GI) has evaluated with EGD, colonoscopy, and video capsular endoscopy in 2011 & 2012.  All have been unrevealing as to an origin of IDA.  OV with Dr Cristina Gong (10/28/10) assessment of blood in stool per hemoccult and GER. Hbg 12.1 g/dL, MCV 91.8, Ferritin 30 ng/mL. Patient taking on ferrous sulfate tab daily.   EGD on 03/06/12 by Dr Cristina Gong for IDA non-obstructing Schatzki's ring at Gastroesophageal junction, otherwise normal esophagus and stomach.     . Laceration of arm, unspecified laterality, initial encounter 11/03/2017  . Left leg cellulitis   . Left Metatarsal fracture, 5th, avulsion styloid 12/29/2017  . Leg cramps 05/29/2012  . Low back pain   . Lumbar herniated disc    History of HNP L4/5 in 2003  . Macular degeneration, bilateral 10/04/2010   Right eye is wet MD, the other is dry macular degeneration (ARMD). Pt undergoing some form of vascular endothelial growth factor inhibition intraocular therapy.    . Mild cognitive impairment 10/26/2012   (10/25/12) Failed MiniCog screen  . MUSCLE CRAMPS 03/11/2010   Qualifier: Diagnosis of  By: McDiarmid MD, Sherren Mocha    . Muscle spasm of back 09/24/2013  . Myocardial infarct, old   . Nocturia 10/28/2016  . Numbness and tingling in hands  07/21/2011  . Pacemaker    MDT  . Paroxysmal atrial fibrillation (HCC)   . Persistent atrial fibrillation ([place]) 05/31/2014  . PREDIABETES 09/11/2007   Qualifier: Diagnosis of  By: McDiarmid MD, Sherren Mocha    . Retinal hemorrhage of left eye 06/2010  . RHINITIS, ALLERGIC 04/27/2006   Qualifier: Diagnosis of  By: McDiarmid MD, Sherren Mocha    . SCHATZKI'S RING, HX OF 11/26/2007   Qualifier: Diagnosis of  By: McDiarmid MD, Sherren Mocha  An EGD was performed by Dr Cristina Gong on 04/27/2010 for iron deficiency anemia. There was a a transient hiatal hernia with Schatzki's ring. Stomach and duodenum were normal. EGD on 03/06/12 by Dr Cristina Gong for IDA non-obstructing Schatzki's ring at Gastroesophageal junction, otherwise normal esophagus and stomach.    Marland Kitchen  Seborrheic keratosis, right anterior thigh 12/12/2013  . Shoulder pain, left 01/09/2014  . SICK SINUS SYNDROME 04/27/2006   S/P dual chamber pacemaker placement by Dr Osie Cheeks (EPS-Card) with pacemaker lead extraction & reinsertion - 07/25/2003  Hospitalization (9/8-9/10, Dr Daneen Schick, III, Cardiology) for Paroxysmal Atrial Fibrillation with RVR and anginal pain secondary to demand/supply mismatch in setting of RVR with known circumflex artery branch disease.    . Sick sinus syndrome with tachycardia ([place])    MDT  . Soft tissue injury of foot 05/03/2011  . Solar lentigo 06/15/2012  . Solitary kidney, acquired 05/17/2010   Surgical removal for transitional cell cancer by Tresa Endo, MD ([place]). Surveillance cystoscopy by Dr Alinda Money [facility]) on 10/19/12 without evidence of cystoscopic recurrence. Recommend RTC one year for cystoscopy.   Marland Kitchen Spinal stenosis, lumbar   . Transitional cell carcinoma of ureter, history   . Traumatic ecchymosis of left foot 12/07/2017  . Trigger point with neck pain 10/26/2018  . Urge incontinence 12/13/2011   Diagnosed in 10/2011 by Dr Bjorn Loser (Urology)   . Venous stasis ulcer of ankle, left ([place]) 10/24/2014  . VENTRICULAR HYPERTROPHY,  LEFT 08/28/2008   Qualifier: Diagnosis of  By: McDiarmid MD, Sherren Mocha    . VITAMIN B12 DEFICIENCY 10/07/2009   Qualifier: Diagnosis of  By: McDiarmid MD, Sherren Mocha  Dx based on a post-TKR anemia work-up Low normal serum B12 with high Methylmalonic acid and homocysteine level  Vit B12 serum level (10/28/10) > 1500 pg/mL   . Vitamin D deficiency 11/02/2010   Serum vitamin D 25(OH) = 10.9 ng/mL (30 -100) on 10/28/10 c/w Vitamin D deficiency.     . Wound of left foot 12/29/2017    Patient Active Problem List   Diagnosis Date Noted  . Frailty 01/13/2020  . Deficits in activities of daily living 01/13/2020  . Cor pulmonale, chronic ([place]) 08/30/2019  . Ambulatory dysfunction 08/19/2019  . Venous stasis ulcers of both lower extremities ([place]) 07/05/2019  . Cystitis 03/19/2019  . History of recent fall 03/19/2019  . Atrial fibrillation ([place]), permanent   . Bilateral leg edema 01/10/2017  . Chronic diastolic CHF (congestive heart failure) ([place]) 11/08/2015  . On amiodarone therapy 09/14/2015  . Pure hypercholesterolemia 07/31/2014  . CAD S/P RCA BMS, residual CFX disease 06/08/2014  . Healthcare maintenance 04/08/2014  . Pacemaker 04/02/2013  . Dementia ([place]), possible Alzhemier's type 10/26/2012  . Chronic anticoagulation 10/01/2012  . Urge incontinence 12/13/2011  . Vitamin D deficiency 11/02/2010  . Macular degeneration, bilateral 10/04/2010    Class: Chronic  . History of Iron deficiency anemia due to chronic GI blood loss 08/05/2010  . Solitary kidney, acquired 05/17/2010  . Spondylolisthesis of lumbar region   . Vitamin B12 deficiency 10/07/2009  . Chronic kidney disease (CKD), stage IV (severe) ([place]) 09/02/2009  . MYOCARDIAL INFARCTION, HX OF 09/25/2008  . VENTRICULAR HYPERTROPHY, LEFT 08/28/2008  . At risk for diabetes mellitus 09/11/2007  . Tachycardia-bradycardia syndrome ([place]) 04/27/2006  . Allergic rhinitis, seasonal 04/27/2006  . COPD, severe ([place]) 04/27/2006  . GASTROESOPHAGEAL REFLUX, NO  ESOPHAGITIS 04/27/2006  . Osteoarthrosis involving lower leg 04/27/2006  . LUMBAR SPINAL STENOSIS 04/27/2006    Past Surgical History:  Procedure Laterality Date  . BREAST SURGERY     breast reduction  . CARDIOVERSION N/A 10/20/2017   Procedure: CARDIOVERSION;  Surgeon: Josue Hector, MD;  Location[facility] ENDOSCOPY;  Service: Cardiovascular;  Laterality: N/A;  . CHOLECYSTECTOMY    . CORONARY ANGIOPLASTY WITH STENT PLACEMENT  2010  BMS RCA, OM2 occluded  . CYSTOSCOPY  09/30/2015   No cystoscopic evidence of uroepithelial neoplasm.  Follow up surveillance cystoscopy in one year  . CYSTOSCOPY  10/13/2017   Dr Raynelle Bring [facility]rology)  . DUPUYTREN CONTRACTURE RELEASE Right 05/22/2014   Procedure: DUPUYTREN RELEASE AND REPAIR AS NECESSARY RIGHT RING FINGER AND MIDDLE FINGER;  Surgeon: Roseanne Kaufman, MD;  Location: [facility];  Service: Orthopedics;  Laterality: Right;  . ESOPHAGOGASTRODUODENOSCOPY  04/27/2010   Dr Cristina Gong - found transient H/H & Schatzki's ring  . ESOPHAGOGASTRODUODENOSCOPY ENDOSCOPY  03/06/2012   Dr Cristina Gong - found non-obstuctive Schatzki's ring at [employer] [facility] o/w normal EGD.   Marland Kitchen EYE SURGERY     bilateral cataracts  . KNEE ARTHROSCOPY W/ SYNOVECTOMY  11/2009, left knee   Dr Wynelle Link  . NEPHRECTOMY  For transition cell cancer    Dr Tresa Endo, surgeon  . PACEMAKER GENERATOR CHANGE N/A 11/12/2012   Procedure: PACEMAKER GENERATOR CHANGE;  Surgeon: Evans Lance, MD; [employer] [facility] dual-chamber pacemaker serial number [id number]; Laterality: Right  . PACEMAKER INSERTION  2005   Dr Doreatha Lew  . PACEMAKER LEAD REMOVAL  2005   Removal and reinsertion of atrial and ventricular leads due to migration  . REPLACEMENT TOTAL KNEE  2009, Right knee   Dr Wynelle Link  . REPLACEMENT TOTAL KNEE  05/2009, Left knee   Dr Wynelle Link  . TONSILLECTOMY    . TRIGGER FINGER RELEASE Right 05/22/2014   Procedure: RIGHT HAND A-1 PULLEY RELEASE ;  Surgeon: Roseanne Kaufman, MD;  Location: [facility];  Service:  Orthopedics;  Laterality: Right;     OB History   No obstetric history on file.     Family History  Problem Relation Age of Onset  . Dementia Mother   . Heart disease Father   . Heart attack Father     Social History   Tobacco Use  . Smoking status: Former Smoker    Packs/day: 1.00    Types: Cigarettes    Quit date: 03/02/2009    Years since quitting: 10.9  . Smokeless tobacco: Never Used  Vaping Use  . Vaping Use: Never used  Substance Use Topics  . Alcohol use: Yes    Alcohol/week: 13.0 standard drinks    Types: 7 Glasses of wine, 6 Standard drinks or equivalent per week    Comment: 5 oz white wine per day  . Drug use: No    Home Medications Prior to Admission medications   Medication Sig Start Date End Date Taking? Authorizing Provider  albuterol (PROVENTIL) (2.5 MG/3ML) 0.083% nebulizer solution USE 1 VIAL VIA NEBULIZER EVERY 4 HOURS AS NEEDED FOR WHEEZING OR SHORTNESS OF BREATH 04/16/18   Collene Gobble, MD  albuterol (VENTOLIN HFA) 108 (90 Base) MCG/ACT inhaler Inhale 2 puffs into the lungs every 4 (four) hours as needed for wheezing or shortness of breath. 09/11/18   Collene Gobble, MD  atorvastatin (LIPITOR) 20 MG tablet TAKE 1 TABLET BY MOUTH DAILY 03/18/19   McDiarmid, Blane Ohara, MD  ELIQUIS 2.5 MG TABS tablet TAKE 1 TABLET BY MOUTH TWICE DAILY 01/10/20   Belva Crome, MD  fluticasone [facility]) 50 MCG/ACT nasal spray SHAKE LIQUID AND USE 2 SPRAYS IN EACH NOSTRIL DAILY 12/04/18   Collene Gobble, MD  Fluticasone-Umeclidin-Vilant (TRELEGY ELLIPTA) 100-62.5-25 MCG/INH AEPB Inhale 1 puff into the lungs daily. 07/18/19   Collene Gobble, MD  furosemide (LASIX) 40 MG tablet Take one tablet (40mg ) by mouth daily for 3 days, then take  1/2 tablet (20 mg) by mouth daily until all swelling/fluid is off, then 1/2 tablet (20 mg) by mouth only as needed thereafter 12/25/19   Freada Bergeron, MD  metoprolol succinate (TOPROL XL) 25 MG 24 hr tablet Take 1.5 tablets (37.5 mg  total) by mouth daily. 12/03/19   Furth, Cadence H, PA-C  nitroGLYCERIN (NITROSTAT) 0.4 MG SL tablet Place 1 tablet (0.4 mg total) under the tongue every 5 (five) minutes as needed for chest pain (x 3 tabs). 11/16/15   McDiarmid, Blane Ohara, MD  omeprazole (PRILOSEC) 20 MG capsule TAKE 1 CAPSULE(20 MG) BY MOUTH DAILY 07/18/19   McDiarmid, Blane Ohara, MD  Spacer/Aero-Holding Chambers (AEROCHAMBER MV) inhaler Use as instructed 07/18/19   McDiarmid, Blane Ohara, MD  traMADol (ULTRAM) 50 MG tablet TAKE 1 TABLET BY MOUTH EVERY 6 HOURS AS NEEDED FOR PAIN 06/05/19   McDiarmid, Blane Ohara, MD    Allergies    Baclofen, Codeine phosphate, Hydrochlorothiazide, Montelukast sodium, and Norco [hydrocodone-acetaminophen]  Review of Systems   Review of Systems  Respiratory: Positive for shortness of breath.   Cardiovascular: Positive for leg swelling.  Neurological: Positive for weakness.  All other systems reviewed and are negative.   Physical Exam Updated Vital Signs BP 119/71   Pulse 73   Temp 97.7 F (36.5 C) (Oral)   Resp 20   Ht 5\' 5"  (1.651 m)   Wt 81.2 kg   SpO2 97%   BMI 29.79 kg/m   Physical Exam Vitals and nursing note reviewed. Exam conducted with a chaperone present.  Constitutional:      Appearance: She is well-developed.  HENT:     Head: Normocephalic and atraumatic.     Mouth/Throat:     Mouth: Mucous membranes are moist.     Pharynx: Oropharynx is clear.  Eyes:     Extraocular Movements: Extraocular movements intact.     Pupils: Pupils are equal, round, and reactive to light.  Cardiovascular:     Rate and Rhythm: Normal rate and regular rhythm.  Pulmonary:     Effort: Tachypnea present.     Breath sounds: Rhonchi present.  Abdominal:     General: Bowel sounds are normal.     Palpations: Abdomen is soft.  Genitourinary:    Rectum: Guaiac result positive.  Musculoskeletal:        General: Normal range of motion.     Cervical back: Normal range of motion and neck supple.     Right  lower leg: Edema present.     Left lower leg: Edema present.  Skin:    General: Skin is warm.     Capillary Refill: Capillary refill takes less than 2 seconds.  Neurological:     General: No focal deficit present.     Mental Status: She is alert and oriented to person, place, and time.  Psychiatric:        Mood and Affect: Mood normal.        Behavior: Behavior normal.     ED Results / Procedures / Treatments   Labs (all labs ordered are listed, but only abnormal results are displayed) Labs Reviewed  BASIC METABOLIC PANEL - Abnormal; Notable for the following components:      Result Value   Glucose, Bld 129 (*)    BUN 34 (*)    Creatinine, Ser 1.81 (*)    Calcium 8.5 (*)    GFR, Estimated 26 (*)    All other components within normal limits  CBC WITH DIFFERENTIAL/PLATELET -  Abnormal; Notable for the following components:   RBC 3.30 (*)    Hemoglobin 8.6 (*)    HCT 31.3 (*)    MCHC 27.5 (*)    RDW 16.1 (*)    Lymphs Abs 0.5 (*)    All other components within normal limits  BRAIN NATRIURETIC PEPTIDE - Abnormal; Notable for the following components:   B Natriuretic Peptide 924.0 (*)    All other components within normal limits  POC OCCULT BLOOD, ED - Abnormal; Notable for the following components:   Fecal Occult Bld POSITIVE (*)    All other components within normal limits  RESP PANEL BY RT-PCR (FLU A&B, COVID) ARPGX2  CULTURE, BLOOD (ROUTINE X 2)  CULTURE, BLOOD (ROUTINE X 2)  LACTIC ACID, PLASMA  LACTIC ACID, PLASMA  URINALYSIS, ROUTINE W REFLEX MICROSCOPIC  TYPE AND SCREEN  TROPONIN I (HIGH SENSITIVITY)  TROPONIN I (HIGH SENSITIVITY)    EKG EKG Interpretation  Date/Time:  Sunday January 26 2020 13:22:37 EST Ventricular Rate:  94 PR Interval:    QRS Duration: 129 QT Interval:  371 QTC Calculation: 436 R Axis:   -67 Text Interpretation: Atrial fibrillation Left bundle branch block No significant change since last tracing Confirmed by Isla Pence [phone])  on 01/26/2020 1:29:01 PM   Radiology DG Chest Port 1 View  Result Date: 01/26/2020 CLINICAL DATA:  Shortness of breath. EXAM: PORTABLE CHEST 1 VIEW COMPARISON:  February 02, 2018 FINDINGS: Dual lead cardiac pacemaker in stable position. Enlarged cardiac silhouette. Calcific atherosclerotic disease and tortuosity of the aorta. Opacification of the bilateral lower lung fields. Probable left pleural effusion. Osseous structures are without acute abnormality. Soft tissues are grossly normal. IMPRESSION: Opacification of the bilateral lower lung fields may represent atelectasis, airspace consolidation or pleural effusions. Electronically Signed   By: Fidela Salisbury M.D.   On: 01/26/2020 13:54    Procedures Procedures (including critical care time)  Medications Ordered in ED Medications - No data to display  ED Course  I have reviewed the triage vital signs and the nursing notes.  Pertinent labs & imaging results that were available during my care of the patient were reviewed by me and considered in my medical decision making (see chart for details).    MDM Rules/Calculators/A&P                          Pt's hgb has dropped from 11.2 on 8/5 to 8.6 today.  MCV is nl Her stool is normal color and it is guaiac positive.  Shje has had chronic GI blood loss in the past.  She has seen Dr. Cristina Gong [facility]).  Kidney function is slightly worsened from prior.  It was 26/1.66 on 11/3. It is 34/1.81 today.  CXR shows opacification of bilateral lower lung fields.  This may represent atelectasis, airspace consolidation, or pleural effusions.  CT chest ordered and is pending.  Pt's urine is still pending.    Pt d/w FP residents who will see pt for admission.  CRITICAL CARE Performed by: Isla Pence   Total critical care time: 30 minutes  Critical care time was exclusive of separately billable procedures and treating other patients.  Critical care was necessary to treat or prevent  imminent or life-threatening deterioration.  Critical care was time spent personally by me on the following activities: development of treatment plan with patient and/or surrogate as well as nursing, discussions with consultants, evaluation of patient's response to treatment, examination of patient, obtaining  history from patient or surrogate, ordering and performing treatments and interventions, ordering and review of laboratory studies, ordering and review of radiographic studies, pulse oximetry and re-evaluation of patient's condition.  Final Clinical Impression(s) / ED Diagnoses Final diagnoses:  SOB (shortness of breath)  Pleural effusion, bilateral  Altered mental status, unspecified altered mental status type  Anemia, unspecified type  Peripheral edema    Rx / DC Orders ED Discharge Orders    None       Isla Pence, MD 01/26/20 1558

## 2020-01-26 NOTE — H&P (Addendum)
[facility] Admission History and Physical Service Pager: [phone]  Patient name: [name] Medical record number: [id number] Date of birth: [birth year] Age: 86 y.o. Gender: female  Primary Care Provider: McDiarmid, Blane Ohara, MD Consultants: None Code Status: FULL Preferred Emergency Contact: Daughter [place], [place] [phone])  Chief Complaint: Shortness of Breath, Altered mental status  Assessment and Plan: [name] is a 86 y.o. female presenting with 1 month of worsening shortness of breath and altered mental status. 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  Dyspnea on Exertion  Trace Pleural Effusion  Patient presents with 1 month of worsening SOB with exertion. Per daughter, they check her pulse ox at home and it has been declining recently with exertion. Today it was in the mid 80s on room air when ambulating. They also report audible wheezing. She was given a neb by EMS and placed on 2L [place] with improvement. In the ED all vitals are stable, including SpO2 >90% on room air. Lab work demonstrated troponin 10, BNP 924.0,  Hgb 8.6, Cr 1.81, lactic acid 1.6. Patient does have significant lower extremity edema (which is chronic, not acute, per daughter) on exam although no appreciable JVD or crackles. CXR showed opacification of bilateral lower lung fields-atelectasis vs. consolidation vs. pleural effusions. CT chest shows trace bilateral pleural effusions. Differential includes CHF exacerbation given elevated BNP and 2+ lower extremity edema on exam. Also includes pneumonia although is afebrile, no leukocytosis, and no clear evidence on imaging. Less likely component of COPD exacerbation with diffuse wheezing on lung exam, however no increased cough or sputum production. PE unlikely as patient is on Eliquis at home and clinical presentation is not consistent (no tachycardia, no  signs of DVT). May also represent deconditioning as daughter reports that patient sits in a recliner for most days with minimal ambulation, and will also sit in her own urine and feces. Given no acute hypoxemia and stability on room air, will continue work up with echo (hx of Cor pulmonale and HFpEF). Will continue home lasix and duonebs as needed for wheezing.  -admit to med-tele, attending Dr. Thompson Grayer -OOB with assistance only -Vitals q4h prn -Supplemental O2 as needed, goal SpO2 >88% -Strict Is/Os, Daily weights -Duoneb q6h prn for wheezing -Continue home Lasix 40mg  daily -Daily BMP -Will obtain echo -PT/OT eval and treat  Recent Declining Mental Status  Hx of Dementia Concern for Failure to Thrive  Patient with change in mental status over past few months. Has been increasingly confused and decreased ability to perform ADLs. Is incontinent of urine and unable to clean herself properly or maintain adequate hygiene. On exam patient is alert and oriented x3 currently. Daughter reports that they had plans to go to [facility] to meet with SW for home health needs or rehab placement, which patient has not been agreeable to in the past.  -f/u UA results -PT/OT eval and treat -Delirium precautions -Will consult social work for additional help at home if not candidate for SNF  Abdominal Soft Tissue Mass 7 x 4.5cm left upper abdomen soft tissue density along left nephrectomy bed, concerning for malignancy noted on CT chest. Patient denies abdominal complaints.  -Would recommend CT abdomen/pelvis with IV contrast, however need to be cautious with contrast given CKD stage IV and s/p nephrectomy -Will discuss contrast with radiology, nephrology and family in the morning   HFpEF  Cor pulmonale  Most recent echo  08/22/2019 showed EF 50-55%. Diastolic parameters were indeterminate at that time. Echo also demonstrated moderate mitral and tricuspid regurg and pulmonary HTN (PASP 44mmHg). On Lasix 40mg  daily at  home and daughter thinks she's compliant with her meds. Repeat Echo for subacute dyspnea on exertion.  -Strict Is/Os, daily weights -Continue home Lasix 40mg  daily -Will obtain repeat echo  Normocytic Anemia Hgb 8.6, MCV 94.8 on admission. Most recent Hgb 10.0 on 10/03/2019 (baseline appears to be 9.7-11). FOBT positive in the ED. Has been seen by [facility] in the past and deemed to have chronic GI blood loss. -Daily CBC -Continue to monitor -Protonix 40mg  daily in place of home omeprazole  Paroxysmal A-Fib Chronic, stable. EKG on admission shows a-fib at 90bpm. On Eliquis and Metoprolol at home. -Continue home Eliquis and Metoprolol -Monitor HR  COPD Patient with diffuse wheezing on exam, but does not appear to be in COPD exacerbation (no increased cough or sputum). On albuterol and trelegy at home -Continue home albuterol and trelegy -Duoneb q6h prn as above  CKD Stage IV, s/p nephrectomy Cr 1.81 today (most recent baseline 1.6-2.1). -Avoid nephrotoxic meds -Daily BMP -Consider nephrology consult if worsening kidney function  Hx of Transitional Cell Carcinoma of Ureter, s/p nephrectomy Chronic, stable. -Will obtain CT abdomen/pelvis as above -UA pending, f/u results  HLD Chronic, stable. On Atorvastatin 20mg  daily. -Continue home Atorvastatin  FEN/GI: Regular diet Prophylaxis: on Eliquis for a-fib  Disposition: med-tele  History of Present Illness:  [name] is a 86 y.o. female presenting with shortness of breath and decline in mental status.  Per daughter, patient has been "going downhill very fast" over the last few months. Has had worsening mental status as well as shortness of breath with minimal exertion. States that she sits in her recliner and doesn't do anything. Will sleep in her recliner rather than go upstairs to her room. Daughter says patient's mind has gotten a lot worse over the past few weeks with increased confusion. For example patient thought she needed  to plan a funeral for her father who died 64 years ago. Daughter is wondering whether she may have a UTI. Patient has had worsening incontinence of urine and patient does not let others help her clean herself.   Of note, patient fell about 6 months ago going to neighbor's house and broke her wrist. Daughter reports patient has been declining since that time. She is concerned about worsening hygiene at home, as patient is incontinent of urine and is not getting the help she needs. Daughter reports that patient is not agreeable to selling house and going to SNF. Goal: PT/OT at home, ensuring she is taking her meds to see if there is any improvement in her ADLs.    Review Of Systems: Per HPI with the following additions:   Review of Systems  Constitutional: Positive for activity change. Negative for fever.  HENT: Negative for rhinorrhea and sore throat.   Respiratory: Positive for shortness of breath. Negative for cough.   Cardiovascular: Positive for leg swelling. Negative for chest pain.  Gastrointestinal: Negative for abdominal pain, nausea and vomiting.  Skin: Negative for rash.  Neurological: Negative for syncope.     Patient Active Problem List   Diagnosis Date Noted  . Frailty 01/13/2020  . Deficits in activities of daily living 01/13/2020  . Cor pulmonale, chronic ([place]) 08/30/2019  . Ambulatory dysfunction 08/19/2019  . Venous stasis ulcers of both lower extremities ([place]) 07/05/2019  . Cystitis 03/19/2019  . History  of recent fall 03/19/2019  . Atrial fibrillation ([place]), permanent   . Bilateral leg edema 01/10/2017  . Chronic diastolic CHF (congestive heart failure) ([place]) 11/08/2015  . On amiodarone therapy 09/14/2015  . Pure hypercholesterolemia 07/31/2014  . CAD S/P RCA BMS, residual CFX disease 06/08/2014  . Healthcare maintenance 04/08/2014  . Pacemaker 04/02/2013  . Dementia ([place]), possible Alzhemier's type 10/26/2012  . Chronic anticoagulation 10/01/2012  . Urge  incontinence 12/13/2011  . Vitamin D deficiency 11/02/2010  . Macular degeneration, bilateral 10/04/2010    Class: Chronic  . History of Iron deficiency anemia due to chronic GI blood loss 08/05/2010  . Solitary kidney, acquired 05/17/2010  . Spondylolisthesis of lumbar region   . Vitamin B12 deficiency 10/07/2009  . Chronic kidney disease (CKD), stage IV (severe) ([place]) 09/02/2009  . MYOCARDIAL INFARCTION, HX OF 09/25/2008  . VENTRICULAR HYPERTROPHY, LEFT 08/28/2008  . At risk for diabetes mellitus 09/11/2007  . Tachycardia-bradycardia syndrome ([place]) 04/27/2006  . Allergic rhinitis, seasonal 04/27/2006  . COPD, severe ([place]) 04/27/2006  . GASTROESOPHAGEAL REFLUX, NO ESOPHAGITIS 04/27/2006  . Osteoarthrosis involving lower leg 04/27/2006  . LUMBAR SPINAL STENOSIS 04/27/2006    Past Medical History: Past Medical History:  Diagnosis Date  . Abnormal mammogram, unspecified 08/23/2010   Followup imaging reassuring.   . Acute appendicitis with rupture   . Acute on chronic diastolic heart failure ([place]) 10/16/2017  . ADENOMATOUS COLONIC POLYP 03/01/2003   Qualifier: Diagnosis of  By: McDiarmid MD, Sherren Mocha Multiple benign polyps of cecum, ascending, transverse and sigmoid colon by 8/09 colonoscopy by Dr Cristina Gong  Colonoscopy by Dr Cristina Gong for iron-deficiency anemia on 04/27/2010 showed three sessile polyps that were in ascending (3 mm x 9 mm), transverse (4 mm), and cecum (3 mm).  All three were tubular adenomas that were negative for high grade dysplasia or malignancy on pathology. Dr Cristina Gong called the polpys benign and not requiring follow-up in view of the patients age.    . Adrenal adenoma    Incidentaloma  . AF (paroxysmal atrial fibrillation) ([place]) 11/11/2010   Hospitalization (9/8-9/10, Dr Daneen Schick, III, Cardiology) for Paroxysmal Atrial Fibrillation with RVR and anginal pain secondary to demand/supply mismatch in setting of RVR with known circumflex artery branch disease.    Marland Kitchen AKI (acute  kidney injury) ([place])   . ANXIETY 04/27/2006   Qualifier: Diagnosis of  By: McDiarmid MD, Sherren Mocha    . Benign essential tremor 02/04/2016  . Candidiasis of the esophagus 11/26/2007  . CAP (community acquired pneumonia) 02/25/2015  . Cataract 2013   Bilateral   . Cellulitis of leg, right 10/01/2012  . Cellulitis of right lower extremity   . Chest pain with moderate risk of acute coronary syndrome 05/21/2015  . Chronic kidney disease (CKD), stage III (moderate) (HCC)   . Concussion with loss of consciousness   . COPD 04/27/2006   Qualifier: Diagnosis of  By: McDiarmid MD, Sherren Mocha    . COPD exacerbation ([place])   . COPD, severe   . Decreased functional mobility and endurance 03/17/2015  . Dementia [facility]), possible Alzhemier's type 10/26/2012   Montreal Cognitive Assessment (25 out of 30, points off in visuospatial and and short-term memory).  Independent in iADLs and ADLs.    . Diastolic heart failure ([place]) 07/30/2015  . DISC WITH RADICULOPATHY 04/27/2006   Qualifier: Diagnosis of  By: McDiarmid MD, Sherren Mocha    . Dyspnea   . EDEMA-LEGS,DUE TO VENOUS OBSTRUCT. 04/27/2006   Qualifier: Diagnosis of  By: McDiarmid MD, Sherren Mocha    .  Fall 10/27/2016  . Fracture of 5th metatarsal, Right, avulsion fx of tip styloid 11/09/2017  . GERD (gastroesophageal reflux disease) 11/08/2015  . 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 Normal uric acid serum level at time of attack.    . Hand trauma, right, initial encounter 03/20/2018  . HERNIA, HIATAL, NONCONGENITAL 04/27/2006   Qualifier: Diagnosis of  By: McDiarmid MD, Sherren Mocha    . High risk medications (not anticoagulants) long-term use 03/05/2012  . History of Hemorrhoids 04/27/2006   Qualifier: Diagnosis of  By: McDiarmid MD, Sherren Mocha    . History of iron deficiency 01/16/2015  . History of Iron deficiency anemia due to chronic GI blood loss 08/05/2010   Dr Cristina Gong (GI) has evaluated with EGD, colonoscopy, and video  capsular endoscopy in 2011 & 2012.  All have been unrevealing as to an origin of IDA.  OV with Dr Cristina Gong (10/28/10) assessment of blood in stool per hemoccult and GER. Hbg 12.1 g/dL, MCV 91.8, Ferritin 30 ng/mL. Patient taking on ferrous sulfate tab daily.   EGD on 03/06/12 by Dr Cristina Gong for IDA non-obstructing Schatzki's ring at [place]  . Hx of colonoscopy with polypectomy 04/27/2010   Dr Cristina Gong found three  tubular adenomas each less than 10 mm size  . Hypertension   . Hypotension 07/09/2015  . Impaired mobility and ADLs 09/01/2017  . Iron deficiency anemia 08/05/2010   Dr Cristina Gong (GI) has evaluated with EGD, colonoscopy, and video capsular endoscopy in 2011 & 2012.  All have been unrevealing as to an origin of IDA.  OV with Dr Cristina Gong (10/28/10) assessment of blood in stool per hemoccult and GER. Hbg 12.1 g/dL, MCV 91.8, Ferritin 30 ng/mL. Patient taking on ferrous sulfate tab daily.   EGD on 03/06/12 by Dr Cristina Gong for IDA non-obstructing Schatzki's ring at Gastroesophageal junction, otherwise normal esophagus and stomach.     . Laceration of arm, unspecified laterality, initial encounter 11/03/2017  . Left leg cellulitis   . Left Metatarsal fracture, 5th, avulsion styloid 12/29/2017  . Leg cramps 05/29/2012  . Low back pain   . Lumbar herniated disc    History of HNP L4/5 in 2003  . Macular degeneration, bilateral 10/04/2010   Right eye is wet MD, the other is dry macular degeneration (ARMD). Pt undergoing some form of vascular endothelial growth factor inhibition intraocular therapy.    . Mild cognitive impairment 10/26/2012   (10/25/12) Failed MiniCog screen  . MUSCLE CRAMPS 03/11/2010   Qualifier: Diagnosis of  By: McDiarmid MD, Sherren Mocha    . Muscle spasm of back 09/24/2013  . Myocardial infarct, old   . Nocturia 10/28/2016  . Numbness and tingling in hands 07/21/2011  . Pacemaker    MDT  . Paroxysmal atrial fibrillation (HCC)   . Persistent atrial fibrillation ([place]) 05/31/2014  . PREDIABETES 09/11/2007    Qualifier: Diagnosis of  By: McDiarmid MD, Sherren Mocha    . Retinal hemorrhage of left eye 06/2010  . RHINITIS, ALLERGIC 04/27/2006   Qualifier: Diagnosis of  By: McDiarmid MD, Sherren Mocha    . SCHATZKI'S RING, HX OF 11/26/2007   Qualifier: Diagnosis of  By: McDiarmid MD, Sherren Mocha  An EGD was performed by Dr Cristina Gong on 04/27/2010 for iron deficiency anemia. There was a a transient hiatal hernia with Schatzki's ring. Stomach and duodenum were normal. EGD on 03/06/12 by Dr Cristina Gong for IDA non-obstructing Schatzki's ring at Gastroesophageal junction, otherwise normal esophagus and stomach.    Marland Kitchen  Seborrheic keratosis, right anterior thigh 12/12/2013  . Shoulder pain, left 01/09/2014  . SICK SINUS SYNDROME 04/27/2006   S/P dual chamber pacemaker placement by Dr Osie Cheeks (EPS-Card) with pacemaker lead extraction & reinsertion - 07/25/2003  Hospitalization (9/8-9/10, Dr Daneen Schick, III, Cardiology) for Paroxysmal Atrial Fibrillation with RVR and anginal pain secondary to demand/supply mismatch in setting of RVR with known circumflex artery branch disease.    . Sick sinus syndrome with tachycardia ([place])    MDT  . Soft tissue injury of foot 05/03/2011  . Solar lentigo 06/15/2012  . Solitary kidney, acquired 05/17/2010   Surgical removal for transitional cell cancer by Tresa Endo, MD ([place]). Surveillance cystoscopy by Dr Alinda Money [facility]) on 10/19/12 without evidence of cystoscopic recurrence. Recommend RTC one year for cystoscopy.   Marland Kitchen Spinal stenosis, lumbar   . Transitional cell carcinoma of ureter, history   . Traumatic ecchymosis of left foot 12/07/2017  . Trigger point with neck pain 10/26/2018  . Urge incontinence 12/13/2011   Diagnosed in 10/2011 by Dr Bjorn Loser (Urology)   . Venous stasis ulcer of ankle, left ([place]) 10/24/2014  . VENTRICULAR HYPERTROPHY, LEFT 08/28/2008   Qualifier: Diagnosis of  By: McDiarmid MD, Sherren Mocha    . VITAMIN B12 DEFICIENCY 10/07/2009   Qualifier: Diagnosis of  By: McDiarmid MD,  Sherren Mocha  Dx based on a post-TKR anemia work-up Low normal serum B12 with high Methylmalonic acid and homocysteine level  Vit B12 serum level (10/28/10) > 1500 pg/mL   . Vitamin D deficiency 11/02/2010   Serum vitamin D 25(OH) = 10.9 ng/mL (30 -100) on 10/28/10 c/w Vitamin D deficiency.     . Wound of left foot 12/29/2017    Past Surgical History: Past Surgical History:  Procedure Laterality Date  . BREAST SURGERY     breast reduction  . CARDIOVERSION N/A 10/20/2017   Procedure: CARDIOVERSION;  Surgeon: Josue Hector, MD;  Location: [facility] ENDOSCOPY;  Service: Cardiovascular;  Laterality: N/A;  . CHOLECYSTECTOMY    . CORONARY ANGIOPLASTY WITH STENT PLACEMENT  2010   BMS RCA, OM2 occluded  . CYSTOSCOPY  09/30/2015   No cystoscopic evidence of uroepithelial neoplasm.  Follow up surveillance cystoscopy in one year  . CYSTOSCOPY  10/13/2017   Dr Raynelle Bring [facility])  . DUPUYTREN CONTRACTURE RELEASE Right 05/22/2014   Procedure: DUPUYTREN RELEASE AND REPAIR AS NECESSARY RIGHT RING FINGER AND MIDDLE FINGER;  Surgeon: Roseanne Kaufman, MD;  Location: [facility];  Service: Orthopedics;  Laterality: Right;  . ESOPHAGOGASTRODUODENOSCOPY  04/27/2010   Dr Cristina Gong - found transient H/H & Schatzki's ring  . ESOPHAGOGASTRODUODENOSCOPY ENDOSCOPY  03/06/2012   Dr Cristina Gong - found non-obstuctive Schatzki's ring at [employer] [facility] o/w normal EGD.   Marland Kitchen EYE SURGERY     bilateral cataracts  . KNEE ARTHROSCOPY W/ SYNOVECTOMY  11/2009, left knee   Dr Wynelle Link  . NEPHRECTOMY  For transition cell cancer    Dr Tresa Endo, surgeon  . PACEMAKER GENERATOR CHANGE N/A 11/12/2012   Procedure: PACEMAKER GENERATOR CHANGE;  Surgeon: Evans Lance, MD; [employer] [facility] dual-chamber pacemaker serial number [id number]; Laterality: Right  . PACEMAKER INSERTION  2005   Dr Doreatha Lew  . PACEMAKER LEAD REMOVAL  2005   Removal and reinsertion of atrial and ventricular leads due to migration  . REPLACEMENT TOTAL KNEE  2009, Right knee   Dr  Wynelle Link  . REPLACEMENT TOTAL KNEE  05/2009, Left knee   Dr Wynelle Link  . TONSILLECTOMY    . TRIGGER FINGER RELEASE  Right 05/22/2014   Procedure: RIGHT HAND A-1 PULLEY RELEASE ;  Surgeon: Roseanne Kaufman, MD;  Location: [facility];  Service: Orthopedics;  Laterality: Right;    Social History: Social History   Tobacco Use  . Smoking status: Former Smoker    Packs/day: 1.00    Types: Cigarettes    Quit date: 03/02/2009    Years since quitting: 10.9  . Smokeless tobacco: Never Used  Vaping Use  . Vaping Use: Never used  Substance Use Topics  . Alcohol use: Yes    Alcohol/week: 13.0 standard drinks    Types: 7 Glasses of wine, 6 Standard drinks or equivalent per week    Comment: 5 oz white wine per day  . Drug use: No    Family History: Family History  Problem Relation Age of Onset  . Dementia Mother   . Heart disease Father   . Heart attack Father     Allergies and Medications: Allergies  Allergen Reactions  . Baclofen Other (See Comments)    Confusion occurred with taking 3 at the same time  . Codeine Phosphate Nausea Only  . Hydrochlorothiazide Other (See Comments)    Possible acute gout or arthritis of wrist  . Montelukast Sodium Other (See Comments)    Reaction not recalled ??  Lebron Quam [Hydrocodone-Acetaminophen] Nausea And Vomiting   No current facility-administered medications on file prior to encounter.   Current Outpatient Medications on File Prior to Encounter  Medication Sig Dispense Refill  . albuterol (PROVENTIL) (2.5 MG/3ML) 0.083% nebulizer solution USE 1 VIAL VIA NEBULIZER EVERY 4 HOURS AS NEEDED FOR WHEEZING OR SHORTNESS OF BREATH 360 mL 5  . albuterol (VENTOLIN HFA) 108 (90 Base) MCG/ACT inhaler Inhale 2 puffs into the lungs every 4 (four) hours as needed for wheezing or shortness of breath. 18 g 5  . atorvastatin (LIPITOR) 20 MG tablet TAKE 1 TABLET BY MOUTH DAILY 90 tablet 3  . ELIQUIS 2.5 MG TABS tablet TAKE 1 TABLET BY MOUTH TWICE DAILY 60 tablet 6  .  fluticasone (FLONASE) 50 MCG/ACT nasal spray SHAKE LIQUID AND USE 2 SPRAYS IN EACH NOSTRIL DAILY 48 g 3  . Fluticasone-Umeclidin-Vilant (TRELEGY ELLIPTA) 100-62.5-25 MCG/INH AEPB Inhale 1 puff into the lungs daily. 60 each 5  . furosemide (LASIX) 40 MG tablet Take one tablet (40mg ) by mouth daily for 3 days, then take 1/2 tablet (20 mg) by mouth daily until all swelling/fluid is off, then 1/2 tablet (20 mg) by mouth only as needed thereafter 45 tablet 3  . metoprolol succinate (TOPROL XL) 25 MG 24 hr tablet Take 1.5 tablets (37.5 mg total) by mouth daily. 135 tablet 3  . nitroGLYCERIN (NITROSTAT) 0.4 MG SL tablet Place 1 tablet (0.4 mg total) under the tongue every 5 (five) minutes as needed for chest pain (x 3 tabs). 25 tablet PRN  . omeprazole (PRILOSEC) 20 MG capsule TAKE 1 CAPSULE(20 MG) BY MOUTH DAILY 90 capsule 3  . Spacer/Aero-Holding Chambers (AEROCHAMBER MV) inhaler Use as instructed 1 each 0  . traMADol (ULTRAM) 50 MG tablet TAKE 1 TABLET BY MOUTH EVERY 6 HOURS AS NEEDED FOR PAIN 30 tablet 2    Objective: BP 119/71   Pulse 73   Temp 97.7 F (36.5 C) (Oral)   Resp 20   Ht 5\' 5"  (1.651 m)   Wt 81.2 kg   SpO2 97%   BMI 29.79 kg/m  Exam: General: alert, well-appearing, NAD Eyes: PERRL ENTM: moist mucous membranes Neck: supple Cardiovascular: irregularly irregular rhythm, normal  S1/S2 Respiratory: normal WOB on room air, diffuse expiratory wheezes, no crackles Gastrointestinal: soft, nontender, nondistended, no palpable masses MSK: 2+ pitting edema of bilateral lower extremities up to the knee. 5/5 strength in all extremities Neuro: alert, oriented to person, place and time. No focal deficits.   Labs and Imaging: CBC BMET  Recent Labs  Lab 01/26/20 1343  WBC 6.1  HGB 8.6*  HCT 31.3*  PLT 209   Recent Labs  Lab 01/26/20 1343  NA 142  K 4.3  CL 107  CO2 24  BUN 34*  CREATININE 1.81*  GLUCOSE 129*  CALCIUM 8.5*     EKG: My own interpretation (not copied from  electronic read) a-fib at 90bpm, wandering baseline   DG Chest [facility] 1 View Result Date: 01/26/2020 IMPRESSION: Opacification of the bilateral lower lung fields may represent atelectasis, airspace consolidation or pleural effusions.    CT Chest Wo Contrast Result Date: 01/26/2020 IMPRESSION: 1. Right trace to small volume and left trace volume pleural effusions. 2. Inferior aspect right upper lobe thin consolidation could represent atelectasis versus aspiration pneumonia. 3. Interval development of a 7 x 4.5 cm left upper abdomen soft tissue density along the left nephrectomy bed. Finding concerning for malignancy. Recommend CT abdomen pelvis with intravenous contrast for further evaluation. 4. Aortic Atherosclerosis (ICD10-I70.0) and Emphysema (ICD10-J43.9).     Alcus Dad, MD 01/26/2020, 3:35 PM PGY-1, [place] Intern pager: [phone], text pages welcome  FPTS Upper-Level Resident Addendum I have independently interviewed and examined the patient. I have discussed the above with the original author and agree with their documentation. My edits for correction/addition/clarification are in - green. Please see also any attending notes.  [place] Service pager: [phone] (text pages welcome through AMION)  Wilber Oliphant, M.D.  PGY-3 01/26/2020 7:19 PM

## 2020-01-27 ENCOUNTER — Observation Stay (HOSPITAL_COMMUNITY): Payer: Medicare Other

## 2020-01-27 ENCOUNTER — Inpatient Hospital Stay (HOSPITAL_COMMUNITY): Payer: Medicare Other

## 2020-01-27 DIAGNOSIS — R404 Transient alteration of awareness: Secondary | ICD-10-CM | POA: Diagnosis not present

## 2020-01-27 DIAGNOSIS — G309 Alzheimer's disease, unspecified: Secondary | ICD-10-CM | POA: Diagnosis not present

## 2020-01-27 DIAGNOSIS — Z1611 Resistance to penicillins: Secondary | ICD-10-CM | POA: Diagnosis present

## 2020-01-27 DIAGNOSIS — E7800 Pure hypercholesterolemia, unspecified: Secondary | ICD-10-CM | POA: Diagnosis present

## 2020-01-27 DIAGNOSIS — R0602 Shortness of breath: Secondary | ICD-10-CM | POA: Diagnosis not present

## 2020-01-27 DIAGNOSIS — D500 Iron deficiency anemia secondary to blood loss (chronic): Secondary | ICD-10-CM | POA: Diagnosis not present

## 2020-01-27 DIAGNOSIS — Z20822 Contact with and (suspected) exposure to covid-19: Secondary | ICD-10-CM | POA: Diagnosis present

## 2020-01-27 DIAGNOSIS — I714 Abdominal aortic aneurysm, without rupture: Secondary | ICD-10-CM | POA: Diagnosis not present

## 2020-01-27 DIAGNOSIS — L97929 Non-pressure chronic ulcer of unspecified part of left lower leg with unspecified severity: Secondary | ICD-10-CM | POA: Diagnosis not present

## 2020-01-27 DIAGNOSIS — R4182 Altered mental status, unspecified: Secondary | ICD-10-CM | POA: Diagnosis not present

## 2020-01-27 DIAGNOSIS — I2729 Other secondary pulmonary hypertension: Secondary | ICD-10-CM | POA: Diagnosis present

## 2020-01-27 DIAGNOSIS — D649 Anemia, unspecified: Secondary | ICD-10-CM

## 2020-01-27 DIAGNOSIS — E278 Other specified disorders of adrenal gland: Secondary | ICD-10-CM | POA: Diagnosis not present

## 2020-01-27 DIAGNOSIS — E785 Hyperlipidemia, unspecified: Secondary | ICD-10-CM | POA: Diagnosis present

## 2020-01-27 DIAGNOSIS — M2550 Pain in unspecified joint: Secondary | ICD-10-CM | POA: Diagnosis not present

## 2020-01-27 DIAGNOSIS — N2889 Other specified disorders of kidney and ureter: Secondary | ICD-10-CM | POA: Diagnosis not present

## 2020-01-27 DIAGNOSIS — I130 Hypertensive heart and chronic kidney disease with heart failure and stage 1 through stage 4 chronic kidney disease, or unspecified chronic kidney disease: Secondary | ICD-10-CM | POA: Diagnosis present

## 2020-01-27 DIAGNOSIS — R2689 Other abnormalities of gait and mobility: Secondary | ICD-10-CM | POA: Diagnosis not present

## 2020-01-27 DIAGNOSIS — I4819 Other persistent atrial fibrillation: Secondary | ICD-10-CM | POA: Diagnosis present

## 2020-01-27 DIAGNOSIS — I4891 Unspecified atrial fibrillation: Secondary | ICD-10-CM | POA: Diagnosis not present

## 2020-01-27 DIAGNOSIS — R5381 Other malaise: Secondary | ICD-10-CM | POA: Diagnosis not present

## 2020-01-27 DIAGNOSIS — I5032 Chronic diastolic (congestive) heart failure: Secondary | ICD-10-CM | POA: Diagnosis present

## 2020-01-27 DIAGNOSIS — R609 Edema, unspecified: Secondary | ICD-10-CM | POA: Diagnosis not present

## 2020-01-27 DIAGNOSIS — I83029 Varicose veins of left lower extremity with ulcer of unspecified site: Secondary | ICD-10-CM | POA: Diagnosis not present

## 2020-01-27 DIAGNOSIS — B9620 Unspecified Escherichia coli [E. coli] as the cause of diseases classified elsewhere: Secondary | ICD-10-CM | POA: Diagnosis not present

## 2020-01-27 DIAGNOSIS — I2510 Atherosclerotic heart disease of native coronary artery without angina pectoris: Secondary | ICD-10-CM | POA: Diagnosis present

## 2020-01-27 DIAGNOSIS — I361 Nonrheumatic tricuspid (valve) insufficiency: Secondary | ICD-10-CM | POA: Diagnosis not present

## 2020-01-27 DIAGNOSIS — Z66 Do not resuscitate: Secondary | ICD-10-CM | POA: Diagnosis not present

## 2020-01-27 DIAGNOSIS — Z515 Encounter for palliative care: Secondary | ICD-10-CM | POA: Diagnosis not present

## 2020-01-27 DIAGNOSIS — J9601 Acute respiratory failure with hypoxia: Secondary | ICD-10-CM | POA: Diagnosis present

## 2020-01-27 DIAGNOSIS — L97919 Non-pressure chronic ulcer of unspecified part of right lower leg with unspecified severity: Secondary | ICD-10-CM | POA: Diagnosis not present

## 2020-01-27 DIAGNOSIS — Z7189 Other specified counseling: Secondary | ICD-10-CM | POA: Diagnosis not present

## 2020-01-27 DIAGNOSIS — Z8554 Personal history of malignant neoplasm of ureter: Secondary | ICD-10-CM | POA: Diagnosis not present

## 2020-01-27 DIAGNOSIS — I340 Nonrheumatic mitral (valve) insufficiency: Secondary | ICD-10-CM | POA: Diagnosis not present

## 2020-01-27 DIAGNOSIS — R7881 Bacteremia: Secondary | ICD-10-CM | POA: Diagnosis present

## 2020-01-27 DIAGNOSIS — I83019 Varicose veins of right lower extremity with ulcer of unspecified site: Secondary | ICD-10-CM | POA: Diagnosis not present

## 2020-01-27 DIAGNOSIS — Z789 Other specified health status: Secondary | ICD-10-CM | POA: Diagnosis not present

## 2020-01-27 DIAGNOSIS — R9082 White matter disease, unspecified: Secondary | ICD-10-CM | POA: Diagnosis not present

## 2020-01-27 DIAGNOSIS — N184 Chronic kidney disease, stage 4 (severe): Secondary | ICD-10-CM | POA: Diagnosis present

## 2020-01-27 DIAGNOSIS — R32 Unspecified urinary incontinence: Secondary | ICD-10-CM | POA: Diagnosis present

## 2020-01-27 DIAGNOSIS — J449 Chronic obstructive pulmonary disease, unspecified: Secondary | ICD-10-CM | POA: Diagnosis present

## 2020-01-27 DIAGNOSIS — F0390 Unspecified dementia without behavioral disturbance: Secondary | ICD-10-CM | POA: Diagnosis present

## 2020-01-27 DIAGNOSIS — G9340 Encephalopathy, unspecified: Secondary | ICD-10-CM | POA: Diagnosis not present

## 2020-01-27 DIAGNOSIS — F0280 Dementia in other diseases classified elsewhere without behavioral disturbance: Secondary | ICD-10-CM | POA: Diagnosis not present

## 2020-01-27 DIAGNOSIS — R262 Difficulty in walking, not elsewhere classified: Secondary | ICD-10-CM | POA: Diagnosis not present

## 2020-01-27 DIAGNOSIS — J90 Pleural effusion, not elsewhere classified: Secondary | ICD-10-CM | POA: Diagnosis present

## 2020-01-27 DIAGNOSIS — R1900 Intra-abdominal and pelvic swelling, mass and lump, unspecified site: Secondary | ICD-10-CM | POA: Diagnosis present

## 2020-01-27 DIAGNOSIS — Z905 Acquired absence of kidney: Secondary | ICD-10-CM | POA: Diagnosis not present

## 2020-01-27 DIAGNOSIS — D509 Iron deficiency anemia, unspecified: Secondary | ICD-10-CM | POA: Diagnosis present

## 2020-01-27 DIAGNOSIS — Z79899 Other long term (current) drug therapy: Secondary | ICD-10-CM | POA: Diagnosis not present

## 2020-01-27 DIAGNOSIS — I723 Aneurysm of iliac artery: Secondary | ICD-10-CM | POA: Diagnosis not present

## 2020-01-27 DIAGNOSIS — Z7401 Bed confinement status: Secondary | ICD-10-CM | POA: Diagnosis not present

## 2020-01-27 DIAGNOSIS — R54 Age-related physical debility: Secondary | ICD-10-CM | POA: Diagnosis not present

## 2020-01-27 DIAGNOSIS — Z7901 Long term (current) use of anticoagulants: Secondary | ICD-10-CM | POA: Diagnosis not present

## 2020-01-27 DIAGNOSIS — I2781 Cor pulmonale (chronic): Secondary | ICD-10-CM | POA: Diagnosis present

## 2020-01-27 LAB — CBC
HCT: 27.5 % — ABNORMAL LOW (ref 36.0–46.0)
Hemoglobin: 7.7 g/dL — ABNORMAL LOW (ref 12.0–15.0)
MCH: 25.9 pg — ABNORMAL LOW (ref 26.0–34.0)
MCHC: 28 g/dL — ABNORMAL LOW (ref 30.0–36.0)
MCV: 92.6 fL (ref 80.0–100.0)
Platelets: 191 10*3/uL (ref 150–400)
RBC: 2.97 MIL/uL — ABNORMAL LOW (ref 3.87–5.11)
RDW: 16 % — ABNORMAL HIGH (ref 11.5–15.5)
WBC: 5 10*3/uL (ref 4.0–10.5)
nRBC: 0 % (ref 0.0–0.2)

## 2020-01-27 LAB — ECHOCARDIOGRAM COMPLETE
Height: 65 in
MV M vel: 4.5 m/s
MV Peak grad: 81 mmHg
Radius: 0.35 cm
S' Lateral: 3.9 cm
Weight: 2895.96 oz

## 2020-01-27 LAB — COMPREHENSIVE METABOLIC PANEL
ALT: 10 U/L (ref 0–44)
AST: 12 U/L — ABNORMAL LOW (ref 15–41)
Albumin: 2.8 g/dL — ABNORMAL LOW (ref 3.5–5.0)
Alkaline Phosphatase: 59 U/L (ref 38–126)
Anion gap: 11 (ref 5–15)
BUN: 31 mg/dL — ABNORMAL HIGH (ref 8–23)
CO2: 23 mmol/L (ref 22–32)
Calcium: 8.4 mg/dL — ABNORMAL LOW (ref 8.9–10.3)
Chloride: 107 mmol/L (ref 98–111)
Creatinine, Ser: 1.85 mg/dL — ABNORMAL HIGH (ref 0.44–1.00)
GFR, Estimated: 25 mL/min — ABNORMAL LOW (ref >60)
Glucose, Bld: 96 mg/dL (ref 70–99)
Potassium: 4.7 mmol/L (ref 3.5–5.1)
Sodium: 141 mmol/L (ref 135–145)
Total Bilirubin: 0.9 mg/dL (ref 0.3–1.2)
Total Protein: 5.9 g/dL — ABNORMAL LOW (ref 6.5–8.1)

## 2020-01-27 MED ORDER — IOHEXOL 9 MG/ML PO SOLN
ORAL | Status: AC
  Filled 2020-01-27: qty 1000

## 2020-01-27 MED ORDER — IOHEXOL 9 MG/ML PO SOLN
500.0000 mL | ORAL | Status: AC
  Administered 2020-01-27 (×2): 500 mL via ORAL

## 2020-01-27 MED ORDER — POLYETHYLENE GLYCOL 3350 17 G PO PACK
17.0000 g | PACK | Freq: Every day | ORAL | Status: AC
  Administered 2020-01-27 – 2020-01-29 (×3): 17 g via ORAL
  Filled 2020-01-27 (×3): qty 1

## 2020-01-27 NOTE — Plan of Care (Signed)
PCCM Social Visit  [name] is known to me from outpatient office. I see her for her COPD which is severe, certainly contributes to her chronic dyspnea and also to her progressive functional decline.  She has baseline soft exp wheezes, present again today. Not clear to me that she has an AE-COPD. This hospitalization may be more related to her volume status and her progressive deconditioning. She has an area of RML atx vs infiltrate on CT chest, significance not clear.   I discussed code status with [name] and her daughter [name]. I have advised them that [name] would not survive CPR / chest compressions or mechanical ventilation. For this reason I have recommended that she not undergo either. They understand this and agree. [name] would be willing to undergo isolated cardioversion or defib attempt for a malignant rhythm.   I will update her [place] orders. I called to notify [facility] of this discussion.   Baltazar Apo, MD, PhD 01/27/2020, 6:07 PM [facility] [phone] or if no answer [phone]

## 2020-01-27 NOTE — Evaluation (Signed)
Physical Therapy Evaluation Patient Details Name: [name] MRN: [id number] DOB: [birth year] Today's Date: 01/27/2020   History of Present Illness  86 y.o. female presenting with SOB x1 month with O2 desaturation during exertion, LE edema, and AMS with concern for FTT. 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  Clinical Impression  PTA, patient lives with family and was mostly sedentary in recliner but furniture walked in the house and used Tyquarius Paglia for ambulation outside of house. Daughter states she doesn't leave the house very often anymore, especially in the past 6 months. Patient with multiple falls and slow decline with general deconditioning over the past 6 months. Patient in recliner upon arrival. Patient performed sit to stand with min guard, cues for hand placement. Patient ambulated 23' with RW and min guard, patient demos flexed trunk throughout and tends to heavily lean over RW during gait, provided tactile and verbal cues with patient refusing to stand upright. Patient presents with generalized weakness, decreased activity tolerance, impaired balance, decreased safety awareness, and impaired functional mobility. Patient will benefit from skilled PT services to address listed deficits. Recommend SNF following discharge to maximize functional mobility and improve endurance.     Follow Up Recommendations SNF;Supervision/Assistance - 24 hour    Equipment Recommendations   (defer to post acute rehab)    Recommendations for Other Services       Precautions / Restrictions Precautions Precautions: Fall Precaution Comments: High fall risk, Monitor HR and SpO2 Restrictions Weight Bearing Restrictions: No      Mobility  Bed Mobility Overal bed mobility: Needs Assistance Bed Mobility: Supine to Sit     Supine to sit: Min assist;HOB elevated     General bed mobility comments: up in recliner  on arrival    Transfers Overall transfer level: Needs assistance Equipment used: Rolling Jaime Grizzell (2 wheeled) Transfers: Sit to/from [facility] Sit to Stand: Min guard Stand pivot transfers: Min guard       General transfer comment: Min guard for sit to stand from recliner, cues for hand placement and controlled descent  Ambulation/Gait Ambulation/Gait assistance: Min guard Gait Distance (Feet): 12 Feet Assistive device: Rolling Delroy Ordway (2 wheeled) Gait Pattern/deviations: Step-through pattern;Decreased stride length;Trunk flexed;Wide base of support Gait velocity: decreased   General Gait Details: patient leaning on RW with cues for upright posture however patient refused to stand upright during ambulation.  Stairs            Wheelchair Mobility    Modified Rankin (Stroke Patients Only)       Balance Overall balance assessment: Needs assistance Sitting-balance support: Bilateral upper extremity supported;Feet supported Sitting balance-Leahy Scale: Fair Sitting balance - Comments: Min guard to maintain upright position with fatigue.    Standing balance support: Bilateral upper extremity supported Standing balance-Leahy Scale: Poor Standing balance comment: Heavy reliance on BUE support on RW.                              Pertinent Vitals/Pain Pain Assessment: No/denies pain    Home Living Family/patient expects to be discharged to:: Private residence Living Arrangements: Children (Daughter) Available Help at Discharge: Family;Available 24 hours/day (Daughter currently on disability) Type of Home: House Home Access: Stairs to enter Entrance Stairs-Rails: Right (Ascending) Entrance Stairs-Number of Steps: 2-3 STE without rails from driveway and 3 STE with R rail up to door Home Layout: Two level;Other (  Comment);Full bath on main level (Patient was sleeping in recliner on main level.) Home Equipment: Jalisha Enneking - 2 wheels;Cane - single  point;Tub bench Additional Comments: Daughter reports full bathroom on main level is very small and is not Jazzy Parmer accessible.     Prior Function Level of Independence: Needs assistance   Gait / Transfers Assistance Needed: Ambulating in home without use of AD. Daughter reports many falls in last 6 months resulting in injury. Patient is unsteady on her feet.   ADL's / Homemaking Assistance Needed: Assist and coaxing for bathing/dressing 2/2 congitive deficits and fatigue.   Comments: sleeps in the living room in a recliner due to unable to get up to 2nd floor to her bedroom     Hand Dominance   Dominant Hand: Right    Extremity/Trunk Assessment   Upper Extremity Assessment Upper Extremity Assessment: Defer to OT evaluation    Lower Extremity Assessment Lower Extremity Assessment: Generalized weakness    Cervical / Trunk Assessment Cervical / Trunk Assessment: Kyphotic  Communication   Communication: No difficulties  Cognition Arousal/Alertness: Awake/alert Behavior During Therapy: WFL for tasks assessed/performed Overall Cognitive Status: History of cognitive impairments - at baseline                                 General Comments: Patient with dementia at baseline. Patient A&Ox2 to person and place this date.       General Comments General comments (skin integrity, edema, etc.): HR during activity 120's and noted A-fib on monitor. spO2 maintained >90% on 2L O2 [place]    Exercises     Assessment/Plan    PT Assessment Patient needs continued PT services  PT Problem List Decreased strength;Decreased activity tolerance;Decreased mobility;Decreased balance;Decreased safety awareness;Cardiopulmonary status limiting activity       PT Treatment Interventions Gait training;Functional mobility training;Stair training;Therapeutic activities;Therapeutic exercise;Balance training;Patient/family education;Wheelchair mobility training    PT Goals (Current goals can  be found in the Care Plan section)  Acute Rehab PT Goals Patient Stated Goal: To go to rehab PT Goal Formulation: With patient/family Time For Goal Achievement: 02/10/20 Potential to Achieve Goals: Fair    Frequency Min 2X/week   Barriers to discharge        Co-evaluation               AM-PAC PT "6 Clicks" Mobility  Outcome Measure Help needed turning from your back to your side while in a flat bed without using bedrails?: A Little Help needed moving from lying on your back to sitting on the side of a flat bed without using bedrails?: A Little Help needed moving to and from a bed to a chair (including a wheelchair)?: A Little Help needed standing up from a chair using your arms (e.g., wheelchair or bedside chair)?: A Little Help needed to walk in hospital room?: A Little Help needed climbing 3-5 steps with a railing? : A Lot 6 Click Score: 17    End of Session Equipment Utilized During Treatment: Gait belt;Oxygen Activity Tolerance: Patient limited by fatigue Patient left: in chair;with call bell/phone within reach;with nursing/sitter in room Nurse Communication: Mobility status PT Visit Diagnosis: Unsteadiness on feet (R26.81);Muscle weakness (generalized) (M62.81);Difficulty in walking, not elsewhere classified (R26.2)    Time: 7829-5621 PT Time Calculation (min) (ACUTE ONLY): 28 min   Charges:   PT Evaluation $PT Eval Moderate Complexity: 1 Mod PT Treatments $Therapeutic Activity: 8-22 mins  Perrin Maltese, PT, DPT Acute Rehabilitation Services Pager [phone] Office [phone]   Alda Lea 01/27/2020, 9:33 AM

## 2020-01-27 NOTE — Progress Notes (Signed)
Family Medicine Teaching Service Daily Progress Note Intern Pager: [phone]  Patient name: [name] Medical record number: [id number] Date of birth: 23-Mar-1923 Age: 86 y.o. Gender: female  Primary Care Provider: McDiarmid, Blane Ohara, MD Consultants: None Code Status: FULL  Pt Overview and Major Events to Date:  11/28: admitted  Assessment and Plan:  [name] is a 86 y.o. female who presented with 1 month of worsening shortness of breath and altered mental status. 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  Dyspnea on Exertion  Trace Pleural Effusions Patient remains stable on room air with SpO2 >91% at rest and no increased work of breathing. Has persistent end expiratory wheezes on lung exam but no crackles appreciated. Still with 2+ pitting edema of bilateral lower legs. Only had 200cc's urine output recorded overnight. -Echo pending -Continue home Lasix 40mg  daily -Continue home albuterol nebs/inhaler -Strict Is/Os, daily weights -Supplemental O2 as needed, goal SpO2 >88% -PT/OT eval and treat  Declining Mental Status  Hx of Dementia Concern for Failure to Thrive Patient complains of confusion over past 2 weeks. Interestingly, she is aware of this confusion. Denies auditory/visual hallucinations. Only oriented to self and place this morning. -PT/OT eval and treat -Delirium precautions -Continue to monitor mental status -Will consult palliative for assistance with goals of care discussion  Abdominal Soft Tissue Mass 7 x 4.5cm left upper abdomen soft tissue density along left nephrectomy bed, concerning for malignancy noted on CT chest. CT abdomen/pelvis with IV contrast recommended at that time however need to proceed with extreme caution re: contrast due to CKD and single kidney. -Nephrology suggested goals of care discussion. If not pursuing treatment for this mass, would not need  CT with contrast -If family wishes to pursue workup, will contact radiology re: contrast vs. noncontrast CT.  CKD Stage IV  Hx of Transitional Cell Carcinoma of Ureter s/p Nephrectomy Chronic, unchanged. Cr 1.85 and GFR 25 this morning. -Avoid nephrotoxic meds -Daily BMP  HFpEF  Cor Pulmonale Most recent echo 08/22/2019 showed EF 50-55% with moderate mitral and tricuspid regurg. Also showed significant pulmonary HTN (PASP 24mmHg). Appears slightly volume overloaded on exam. -Strict Is/Os -Daily weights -Continue home Lasix 40mg  daily -Repeat echo pending  Normocytic Anemia Hgb 7.7  this morning from 8.6 yesterday. She was FOBT+ in the ED on admission. Has reportedly been worked up by GI in the past and deemed to have chronic GI blood loss. -Will attempt to locate GI records and confirm prior workup -Daily CBC  Paroxysmal A-fib Chronic, stable. Remains in rate controlled a-fib.  -Continue home Eliquis and Metoprolol -Cardiac telemetry  COPD Still with some expiratory wheezing on exam, but stable on room air. -Continue home Albuterol and Trelegy  HLD Chronic, stable. -Continue home Atorvastatin 20mg  daily   FEN/GI: Regular diet PPx: on Eliquis for a-fib   Status is: Observation The patient remains OBS appropriate and will d/c before 2 midnights.  Dispo: The patient is from: Home              Anticipated d/c is to: TBD              Anticipated d/c date is: 2 days              Patient currently is not medically stable to d/c.    Subjective:  No acute events overnight. Patient's only complaint this morning is "her mind". States if she  told me what was going on in her mind over the last 2 weeks, I'd think she's gone crazy. Denies visual or auditory hallucinations but reports feeling confused on multiple occasions. Interestingly, patient is very aware of this intermittent confusion she has experienced.  Objective: Temp:  [97.6 F (36.4 C)-98.1 F (36.7 C)] 97.8 F  (36.6 C) (11/29 0300) Pulse Rate:  [56-96] 85 (11/29 0300) Resp:  [15-22] 17 (11/29 0300) BP: (108-161)/(59-100) 130/78 (11/29 0300) SpO2:  [80 %-100 %] 100 % (11/29 0300) Weight:  [81.2 kg-82.1 kg] 82.1 kg (11/29 0300) Physical Exam: General: alert, elderly woman resting comfortably in bed Cardiovascular: irregularly irregular rhythm Respiratory: normal WOB on room air, diffuse end expiratory wheezes, no crackles Abdomen: soft, nontender, nondistended Extremities: 2+ pitting edema of bilateral lower extremities up to her knees Neuro: alert and oriented to self and place. Not oriented to time and unable to state the president. No focal deficits.  Laboratory: Recent Labs  Lab 01/26/20 1343  WBC 6.1  HGB 8.6*  HCT 31.3*  PLT 209   Recent Labs  Lab 01/26/20 1343  NA 142  K 4.3  CL 107  CO2 24  BUN 34*  CREATININE 1.81*  CALCIUM 8.5*  GLUCOSE 129*    Ref. Range 01/26/2020 18:48  URINALYSIS, ROUTINE W REFLEX MICROSCOPIC Unknown Rpt (A)  Appearance Latest Ref Range: CLEAR  CLEAR  Bilirubin Urine Latest Ref Range: NEGATIVE  NEGATIVE  Color, Urine Latest Ref Range: YELLOW  YELLOW  Glucose, UA Latest Ref Range: NEGATIVE mg/dL NEGATIVE  Hgb urine dipstick Latest Ref Range: NEGATIVE  SMALL (A)  Ketones, ur Latest Ref Range: NEGATIVE mg/dL NEGATIVE  Leukocytes,Ua Latest Ref Range: NEGATIVE  NEGATIVE  Nitrite Latest Ref Range: NEGATIVE  NEGATIVE  pH Latest Ref Range: 5.0 - 8.0  5.0  Protein Latest Ref Range: NEGATIVE mg/dL NEGATIVE  Specific Gravity, Urine Latest Ref Range: 1.005 - 1.030  1.020  Bacteria, UA Latest Ref Range: NONE SEEN  NONE SEEN  Hyaline Casts, UA Unknown PRESENT  Mucus Unknown PRESENT  RBC / HPF Latest Ref Range: 0 - 5 RBC/hpf 0-5  Squamous Epithelial / LPF Latest Ref Range: 0 - 5  0-5  WBC, UA Latest Ref Range: 0 - 5 WBC/hpf 0-5    Imaging/Diagnostic Tests: No new imaging since admission   Alcus Dad, MD 01/27/2020, 6:07 AM PGY-1, [place] Intern pager: [phone], text pages welcome

## 2020-01-27 NOTE — NC FL2 (Signed)
[place] LEVEL OF CARE SCREENING TOOL     IDENTIFICATION  Patient Name: [name] Birthdate: December 05, 1923 Sex: female Admission Date (Current Location): 01/26/2020  [facility] and [place] Number:  Herbalist and Address:  [facility] [place], [place] [address] [place], [place] [address]      Provider Number: [id number]  Attending Physician Name and Address:  Lenoria Chime, MD  Relative Name and Phone Number:  [name] [phone]    Current Level of Care: Hospital Recommended Level of Care: [place] Prior Approval Number:    Date Approved/Denied:   PASRR Number: [id number]  Discharge Plan: SNF    Current Diagnoses: Patient Active Problem List   Diagnosis Date Noted  . Dyspnea 01/26/2020  . Frailty 01/13/2020  . Deficits in activities of daily living 01/13/2020  . Cor pulmonale, chronic ([place]) 08/30/2019  . Ambulatory dysfunction 08/19/2019  . Venous stasis ulcers of both lower extremities ([place]) 07/05/2019  . Cystitis 03/19/2019  . History of recent fall 03/19/2019  . Atrial fibrillation ([place]), permanent   . Bilateral leg edema 01/10/2017  . Chronic diastolic CHF (congestive heart failure) ([place]) 11/08/2015  . On amiodarone therapy 09/14/2015  . Pure hypercholesterolemia 07/31/2014  . CAD S/P RCA BMS, residual CFX disease 06/08/2014  . Healthcare maintenance 04/08/2014  . Pacemaker 04/02/2013  . Dementia ([place]), possible Alzhemier's type 10/26/2012  . Chronic anticoagulation 10/01/2012  . Urge incontinence 12/13/2011  . Vitamin D deficiency 11/02/2010  . Macular degeneration, bilateral 10/04/2010  . History of Iron deficiency anemia due to chronic GI blood loss 08/05/2010  . Solitary kidney, acquired 05/17/2010  . Spondylolisthesis of lumbar region   . Vitamin B12 deficiency 10/07/2009  . Chronic kidney disease (CKD), stage IV (severe) ([place]) 09/02/2009  . MYOCARDIAL INFARCTION, HX OF 09/25/2008  .  VENTRICULAR HYPERTROPHY, LEFT 08/28/2008  . At risk for diabetes mellitus 09/11/2007  . Tachycardia-bradycardia syndrome ([place]) 04/27/2006  . Allergic rhinitis, seasonal 04/27/2006  . COPD, severe ([place]) 04/27/2006  . GASTROESOPHAGEAL REFLUX, NO ESOPHAGITIS 04/27/2006  . Osteoarthrosis involving lower leg 04/27/2006  . LUMBAR SPINAL STENOSIS 04/27/2006    Orientation RESPIRATION BLADDER Height & Weight     Self, Time, Situation, Place (History of Dementia)  O2 (2 liters of nasal cannula) Incontinent, External catheter (External Urinary Catheter) Weight: 181 lb (82.1 kg) Height:  5\' 5"  (165.1 cm)  BEHAVIORAL SYMPTOMS/MOOD NEUROLOGICAL BOWEL NUTRITION STATUS      Continent Diet (See Discharge Summary)  AMBULATORY STATUS COMMUNICATION OF NEEDS Skin   Limited Assist Verbally Normal                       Personal Care Assistance Level of Assistance  Bathing, Feeding, Dressing Bathing Assistance: Limited assistance Feeding assistance: Independent (able to feed self;Cardiac) Dressing Assistance: Limited assistance     Functional Limitations Info  Sight, Hearing, Speech Sight Info: Adequate Hearing Info: Adequate Speech Info: Adequate    SPECIAL CARE FACTORS FREQUENCY  PT (By licensed PT), OT (By licensed OT)     PT Frequency: 5x min weekly OT Frequency: 5x min weekly            Contractures Contractures Info: Not present    Additional Factors Info  Code Status, Allergies Code Status Info: FULL Allergies Info: Baclofen,Codeine Phosphate,Hydrochlorothiazide,Montelukast Sodium,Norco (hydrocodone-acetaminophen),Penicillins           Current Medications (01/27/2020):  This is the current hospital active medication list Current Facility-Administered Medications  Medication Dose Route Frequency Provider Last Rate Last Admin  . albuterol (PROVENTIL) (2.5 MG/3ML) 0.083% nebulizer solution 2.5 mg  2.5 mg Nebulization Q4H PRN Alcus Dad, MD      . apixaban Arne Cleveland)  tablet 2.5 mg  2.5 mg Oral BID Alcus Dad, MD   2.5 mg at 01/27/20 0857  . atorvastatin (LIPITOR) tablet 20 mg  20 mg Oral Daily Alcus Dad, MD   20 mg at 01/27/20 0857  . fluticasone (FLONASE) 50 MCG/ACT nasal spray 2 spray  2 spray Each Nare Daily PRN Alcus Dad, MD      . fluticasone furoate-vilanterol (BREO ELLIPTA) 100-25 MCG/INH 1 puff  1 puff Inhalation Daily Leeanne Rio, MD       And  . umeclidinium bromide (INCRUSE ELLIPTA) 62.5 MCG/INH 1 puff  1 puff Inhalation Daily Leeanne Rio, MD      . furosemide (LASIX) tablet 40 mg  40 mg Oral Daily Alcus Dad, MD   40 mg at 01/27/20 0858  . metoprolol succinate (TOPROL-XL) 24 hr tablet 37.5 mg  37.5 mg Oral Daily Alcus Dad, MD   37.5 mg at 01/27/20 0857  . pantoprazole (PROTONIX) EC tablet 40 mg  40 mg Oral Daily Alcus Dad, MD   40 mg at 01/27/20 0857  . polyethylene glycol (MIRALAX / GLYCOLAX) packet 17 g  17 g Oral Daily Alcus Dad, MD   17 g at 01/27/20 1629     Discharge Medications: Please see discharge summary for a list of discharge medications.  Relevant Imaging Results:  Relevant Lab Results:   Additional Information SSN-[id number]  Trula Ore, LCSWA

## 2020-01-27 NOTE — TOC Initial Note (Signed)
Transition of Care [facility]) - Initial/Assessment Note    Patient Details  Name: [name] MRN: [id number] Date of Birth: 05/14/1923  Transition of Care [facility]) CM/SW Contact:    Trula Ore, [place] Phone Number: 01/27/2020, 3:00 PM  Clinical Narrative:                  CSW received consult for possible SNF placement at time of discharge. CSW spoke with patients POA daughter [name] regarding PT recommendation of SNF placement at time of discharge. Patient comes from home. Patients daughter expressed understanding of PT recommendation and is agreeable to SNF placement at time of discharge. Patients daughter gave CSW permission to fax out initial referral close to [place] area.  Patient has received the COVID vaccines as well as booster. No further questions reported at this time. CSW to continue to follow and assist with discharge planning needs.   Expected Discharge Plan: Skilled Nursing Facility Barriers to Discharge: Continued Medical Work up   Patient Goals and CMS Choice   CMS Medicare.gov Compare Post Acute Care list provided to:: Patient Represenative (must comment) [name] daughter) Choice offered to / list presented to : Adult Children [name])  Expected Discharge Plan and Services Expected Discharge Plan: [place] arrangements for the past 2 months: Single Family Home                                      Prior Living Arrangements/Services Living arrangements for the past 2 months: Single Family Home Lives with:: Self, Adult Children Patient language and need for interpreter reviewed:: Yes Do you feel safe going back to the place where you live?: No   SNF  Need for Family Participation in Patient Care: Yes (Comment) Care giver support system in place?: Yes (comment)   Criminal Activity/Legal Involvement Pertinent to Current Situation/Hospitalization: No - Comment as needed  Activities of Daily Living      Permission  Sought/Granted Permission sought to share information with : Case Manager, Family Supports, Chartered certified accountant granted to share information with : Yes, Verbal Permission Granted  Share Information with NAME: [name]  Permission granted to share info w AGENCY: SNF  Permission granted to share info w Relationship: daughter  Permission granted to share info w Contact Information: [name] [phone]  Emotional Assessment       Orientation: : Oriented to Self, Oriented to Place, Oriented to  Time, Oriented to Situation (history of dementia) Alcohol / Substance Use: Not Applicable Psych Involvement: No (comment)  Admission diagnosis:  Peripheral edema [R60.9] Dyspnea [R06.00] SOB (shortness of breath) [R06.02] Pleural effusion, bilateral [J90] Altered mental status, unspecified altered mental status type [R41.82] Anemia, unspecified type [D64.9] Patient Active Problem List   Diagnosis Date Noted  . Dyspnea 01/26/2020  . Frailty 01/13/2020  . Deficits in activities of daily living 01/13/2020  . Cor pulmonale, chronic ([place]) 08/30/2019  . Ambulatory dysfunction 08/19/2019  . Venous stasis ulcers of both lower extremities ([place]) 07/05/2019  . Cystitis 03/19/2019  . History of recent fall 03/19/2019  . Atrial fibrillation ([place]), permanent   . Bilateral leg edema 01/10/2017  . Chronic diastolic CHF (congestive heart failure) ([place]) 11/08/2015  . On amiodarone therapy 09/14/2015  . Pure hypercholesterolemia 07/31/2014  . CAD S/P RCA BMS, residual CFX disease 06/08/2014  . Healthcare maintenance 04/08/2014  . Pacemaker 04/02/2013  . Dementia (  [place]), possible Alzhemier's type 10/26/2012  . Chronic anticoagulation 10/01/2012  . Urge incontinence 12/13/2011  . Vitamin D deficiency 11/02/2010  . Macular degeneration, bilateral 10/04/2010    Class: Chronic  . History of Iron deficiency anemia due to chronic GI blood loss 08/05/2010  . Solitary kidney, acquired  05/17/2010  . Spondylolisthesis of lumbar region   . Vitamin B12 deficiency 10/07/2009  . Chronic kidney disease (CKD), stage IV (severe) ([place]) 09/02/2009  . MYOCARDIAL INFARCTION, HX OF 09/25/2008  . VENTRICULAR HYPERTROPHY, LEFT 08/28/2008  . At risk for diabetes mellitus 09/11/2007  . Tachycardia-bradycardia syndrome ([place]) 04/27/2006  . Allergic rhinitis, seasonal 04/27/2006  . COPD, severe ([place]) 04/27/2006  . GASTROESOPHAGEAL REFLUX, NO ESOPHAGITIS 04/27/2006  . Osteoarthrosis involving lower leg 04/27/2006  . LUMBAR SPINAL STENOSIS 04/27/2006   PCP:  McDiarmid, Blane Ohara, MD Pharmacy:   [facility] [place] [address] [place] [address] Phone: [phone] Fax: [phone]  [facility] - [place], [place] - [address] AT [place] [address] [place] [address] Phone: [phone] Fax: [phone]  [facility] [place], [place] [address] Phone: [phone] Fax: [phone]     Social Determinants of Health (SDOH) Interventions    Readmission Risk Interventions No flowsheet data found.

## 2020-01-27 NOTE — Evaluation (Signed)
Occupational Therapy Evaluation Patient Details Name: [name] MRN: [id number] DOB: 1923/12/10 Today's Date: 01/27/2020    History of Present Illness 86 y.o. female presenting with SOB x1 month with O2 desaturation during exertion, LE edema, and AMS with concern for FTT. 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   Clinical Impression   PTA patient was living with family and was requiring coaxing/encouragemnt and assistance for ADLs including bathing/dressing. IADLs including meal prep, homemaking and med management were completed by patient's daughter. Per daughter present at bedside, patient has a RW but prefers not to use it in the home. Patient with multiple falls and slow decline with general deconditioning over the past 6 months. On OT evaluation, patient demonstrates deficits in sitting/standing balance, safety awareness, cognition with Hx of dementia, DOE with need for supplemental O2, and generalized weakness. Patient would benefit from continued acute OT services to maximize safety and independence with self-care tasks, ADL transfers, and household mobility in prep for safe d/c to next level of care with recommendation for SNF rehab. Patient/family in agreement.     Follow Up Recommendations  SNF    Equipment Recommendations  None recommended by OT    Recommendations for Other Services       Precautions / Restrictions Precautions Precautions: Fall Precaution Comments: High fall risk, Monitor HR and SpO2 Restrictions Weight Bearing Restrictions: No      Mobility Bed Mobility Overal bed mobility: Needs Assistance Bed Mobility: Supine to Sit     Supine to sit: Min assist;HOB elevated     General bed mobility comments: Min A with cues for hand placement and incrased tiem 2/2 fatigue.     Transfers Overall transfer level: Needs assistance Equipment used: Rolling walker (2  wheeled) Transfers: Sit to/from [facility] Sit to Stand: Min guard Stand pivot transfers: Min guard       General transfer comment: Min guard for sit to stand from slightly elevated EOB and Min guard for SPT to recliner with cues for hand placement and controlled descent.     Balance Overall balance assessment: Needs assistance Sitting-balance support: Bilateral upper extremity supported;Feet supported Sitting balance-Leahy Scale: Fair Sitting balance - Comments: Min guard to maintain upright position with fatigue.    Standing balance support: Bilateral upper extremity supported Standing balance-Leahy Scale: Poor Standing balance comment: Heavy reliance on BUE support on RW.                            ADL either performed or assessed with clinical judgement   ADL Overall ADL's : Needs assistance/impaired Eating/Feeding: Set up Eating/Feeding Details (indicate cue type and reason): Patient able to grasp cup and take sips without assist.  Grooming: Wash/dry face;Min guard Grooming Details (indicate cue type and reason): Patient able to wash face seated at EOB with Min guard to maintain sitting balance 2/2 fatigue.          Upper Body Dressing : Min guard;Sitting Upper Body Dressing Details (indicate cue type and reason): To don anterior hospital gown seated at EOB. Min guard to maintain sitting balance 2/2 fatigue.  Lower Body Dressing: Maximal assistance Lower Body Dressing Details (indicate cue type and reason): Max A to don footwear seated EOB.  Toilet Transfer: Minimal Scientist, forensic Details (indicate cue type and reason): Simulated with stand-pivot transfer to recliner.  Functional mobility during ADLs: Min guard;Rolling walker General ADL Comments: Patient fatigues quickly requiring frequent and extended rest breaks. Patient on RA initially desating to 86%. Patient with SpO2 >95% on 2L O2 via [place].      Vision  Baseline Vision/History: Wears glasses Vision Assessment?: No apparent visual deficits     Perception     Praxis      Pertinent Vitals/Pain Pain Assessment: No/denies pain     Hand Dominance Right   Extremity/Trunk Assessment Upper Extremity Assessment Upper Extremity Assessment: Generalized weakness   Lower Extremity Assessment Lower Extremity Assessment: Defer to PT evaluation   Cervical / Trunk Assessment Cervical / Trunk Assessment: Kyphotic   Communication Communication Communication: No difficulties   Cognition Arousal/Alertness: Awake/alert Behavior During Therapy: WFL for tasks assessed/performed Overall Cognitive Status: History of cognitive impairments - at baseline                                 General Comments: Patient with dementia at baseline. Patient A&Ox2 to person and place this date.    General Comments  HR from 100's to 120's with activity and A-fib. SpO2 <90% on RA and >95% on 2L O2 via [place].     Exercises     Shoulder Instructions      Home Living Family/patient expects to be discharged to:: Private residence Living Arrangements: Children (Daughter - out of work ("bad back")) Available Help at Discharge: Family;Available 24 hours/day Type of Home: House Home Access: Stairs to enter [employer] of Steps: 2-3 STE without rails from driveway and 3 STE with R rail up to door Entrance Stairs-Rails: Right (Ascending) Home Layout: Two level;Other (Comment);Full bath on main level (Patient was sleeping in recliner on main level.)     Bathroom Shower/Tub: Teacher, early years/pre: Standard Bathroom Accessibility: No   Home Equipment: Walker - 2 wheels;Cane - single point;Tub bench   Additional Comments: Daughter reports full bathroom on main level is very small and is not walker accessible.       Prior Functioning/Environment Level of Independence: Needs assistance  Gait / Transfers Assistance Needed:  Ambulating in home without use of AD. Daughter reports many falls in last 6 months resulting in injury. Patient is unsteady on her feet.  ADL's / Homemaking Assistance Needed: Assist and coaxing for bathing/dressing 2/2 congitive deficits and fatigue.    Comments: sleeps in the living room in a recliner due to unable to get up to 2nd floor to her bedroom        OT Problem List: Decreased strength;Decreased range of motion;Decreased activity tolerance;Impaired balance (sitting and/or standing);Decreased coordination;Decreased cognition;Decreased safety awareness;Decreased knowledge of use of DME or AE;Decreased knowledge of precautions;Cardiopulmonary status limiting activity;Pain;Impaired UE functional use      OT Treatment/Interventions: Self-care/ADL training;Therapeutic exercise;Energy conservation;DME and/or AE instruction;Therapeutic activities;Patient/family education;Balance training    OT Goals(Current goals can be found in the care plan section) Acute Rehab OT Goals Patient Stated Goal: To go to rehab OT Goal Formulation: With patient/family Time For Goal Achievement: 02/10/20 Potential to Achieve Goals: Good ADL Goals Pt Will Perform Grooming: with supervision;sitting Pt Will Perform Upper Body Dressing: with supervision;sitting Pt Will Perform Lower Body Dressing: with min assist;sit to/from stand Pt Will Transfer to Toilet: with supervision;ambulating Pt Will Perform Toileting - Clothing Manipulation and hygiene: with supervision;sit to/from stand Pt/caregiver will Perform Home Exercise Program: Increased strength;Increased ROM;Both right and left upper extremity;With written  HEP provided;With Supervision  OT Frequency: Min 2X/week   Barriers to D/C: Inaccessible home environment          Co-evaluation              AM-PAC OT "6 Clicks" Daily Activity     Outcome Measure Help from another person eating meals?: A Little Help from another person taking care of  personal grooming?: A Little Help from another person toileting, which includes using toliet, bedpan, or urinal?: A Little Help from another person bathing (including washing, rinsing, drying)?: A Lot Help from another person to put on and taking off regular upper body clothing?: A Little Help from another person to put on and taking off regular lower body clothing?: A Lot 6 Click Score: 16   End of Session Equipment Utilized During Treatment: Gait belt;Rolling walker Nurse Communication: Mobility status  Activity Tolerance: Patient tolerated treatment well Patient left: in chair;with call bell/phone within reach;with chair alarm set;with family/visitor present  OT Visit Diagnosis: Unsteadiness on feet (R26.81);Repeated falls (R29.6);Muscle weakness (generalized) (M62.81);Adult, failure to thrive (R62.7)                Time: 7628-3151 OT Time Calculation (min): 27 min Charges:  OT General Charges $OT Visit: 1 Visit OT Evaluation $OT Eval Moderate Complexity: 1 Mod OT Treatments $Therapeutic Activity: 8-22 mins  Del Wiseman H. OTR/L Supplemental OT, Department of rehab services [phone]  Cleophas Yoak R H. 01/27/2020, 8:48 AM

## 2020-01-27 NOTE — Progress Notes (Signed)
FPTS Interim Progress Note  Spoke with nephrology re: obtaining CT abdomen/pelvis with IV contrast. They feel patient is high risk for contrast-induced nephropathy given CKD Stage IV and prior nephrectomy. They recommend reaching out to radiology about alternative imaging options such as MRI.  Subsequently spoke with radiology, who felt patient would be unable to tolerate MRI (requires significant breath holding) and would result in a poor quality study. They, too, feel IV contrast is high risk in this patient. They recommend CT with oral contrast (WITHOUT IV contrast) and feel this would be the most appropriate first step in evaluating patient's abdominal mass.   Alcus Dad, MD 01/27/2020, 5:02 PM PGY-1, [facility] pager [phone]

## 2020-01-27 NOTE — Plan of Care (Signed)
  Problem: Education: Goal: Ability to demonstrate management of disease process will improve Outcome: Progressing Goal: Ability to verbalize understanding of medication therapies will improve Outcome: Progressing   Problem: Activity: Goal: Capacity to carry out activities will improve Outcome: Progressing   Problem: Cardiac: Goal: Ability to achieve and maintain adequate cardiopulmonary perfusion will improve Outcome: Progressing   Problem: Education: Goal: Knowledge of General Education information will improve Description: Including pain rating scale, medication(s)/side effects and non-pharmacologic comfort measures Outcome: Progressing   Problem: Clinical Measurements: Goal: Will remain free from infection Outcome: Progressing Goal: Cardiovascular complication will be avoided Outcome: Progressing   Problem: Activity: Goal: Risk for activity intolerance will decrease Outcome: Progressing   Problem: Coping: Goal: Level of anxiety will decrease Outcome: Progressing   Problem: Elimination: Goal: Will not experience complications related to bowel motility Outcome: Progressing Goal: Will not experience complications related to urinary retention Outcome: Progressing   Problem: Pain Managment: Goal: General experience of comfort will improve Outcome: Progressing   Problem: Safety: Goal: Ability to remain free from injury will improve Outcome: Progressing   Problem: Skin Integrity: Goal: Risk for impaired skin integrity will decrease Outcome: Progressing

## 2020-01-27 NOTE — Progress Notes (Signed)
  Echocardiogram 2D Echocardiogram has been performed.  [name] 01/27/2020, 3:31 PM

## 2020-01-28 ENCOUNTER — Inpatient Hospital Stay (HOSPITAL_COMMUNITY): Payer: Medicare Other

## 2020-01-28 ENCOUNTER — Ambulatory Visit: Payer: Medicare Other | Admitting: Licensed Clinical Social Worker

## 2020-01-28 ENCOUNTER — Encounter (HOSPITAL_COMMUNITY): Payer: Self-pay | Admitting: Family Medicine

## 2020-01-28 DIAGNOSIS — R600 Localized edema: Secondary | ICD-10-CM | POA: Diagnosis present

## 2020-01-28 DIAGNOSIS — I4891 Unspecified atrial fibrillation: Secondary | ICD-10-CM

## 2020-01-28 DIAGNOSIS — R54 Age-related physical debility: Secondary | ICD-10-CM

## 2020-01-28 DIAGNOSIS — Z7189 Other specified counseling: Secondary | ICD-10-CM

## 2020-01-28 DIAGNOSIS — R2689 Other abnormalities of gait and mobility: Secondary | ICD-10-CM | POA: Diagnosis present

## 2020-01-28 DIAGNOSIS — I700 Atherosclerosis of aorta: Secondary | ICD-10-CM | POA: Diagnosis present

## 2020-01-28 DIAGNOSIS — R609 Edema, unspecified: Secondary | ICD-10-CM

## 2020-01-28 DIAGNOSIS — I714 Abdominal aortic aneurysm, without rupture, unspecified: Secondary | ICD-10-CM | POA: Diagnosis present

## 2020-01-28 DIAGNOSIS — Z719 Counseling, unspecified: Secondary | ICD-10-CM

## 2020-01-28 DIAGNOSIS — J90 Pleural effusion, not elsewhere classified: Secondary | ICD-10-CM | POA: Diagnosis not present

## 2020-01-28 DIAGNOSIS — R262 Difficulty in walking, not elsewhere classified: Secondary | ICD-10-CM

## 2020-01-28 DIAGNOSIS — Z515 Encounter for palliative care: Secondary | ICD-10-CM

## 2020-01-28 DIAGNOSIS — N184 Chronic kidney disease, stage 4 (severe): Secondary | ICD-10-CM

## 2020-01-28 DIAGNOSIS — R0602 Shortness of breath: Secondary | ICD-10-CM | POA: Diagnosis not present

## 2020-01-28 DIAGNOSIS — K5900 Constipation, unspecified: Secondary | ICD-10-CM | POA: Diagnosis present

## 2020-01-28 LAB — BLOOD CULTURE ID PANEL (REFLEXED) - BCID2

## 2020-01-28 LAB — FERRITIN: Ferritin: 31 ng/mL (ref 11–307)

## 2020-01-28 LAB — CBC
HCT: 27 % — ABNORMAL LOW (ref 36.0–46.0)
Hemoglobin: 7.6 g/dL — ABNORMAL LOW (ref 12.0–15.0)
MCH: 26.4 pg (ref 26.0–34.0)
MCHC: 28.1 g/dL — ABNORMAL LOW (ref 30.0–36.0)
MCV: 93.8 fL (ref 80.0–100.0)
Platelets: 178 10*3/uL (ref 150–400)
RBC: 2.88 MIL/uL — ABNORMAL LOW (ref 3.87–5.11)
RDW: 15.9 % — ABNORMAL HIGH (ref 11.5–15.5)
WBC: 5.8 10*3/uL (ref 4.0–10.5)
nRBC: 0 % (ref 0.0–0.2)

## 2020-01-28 LAB — BASIC METABOLIC PANEL
Anion gap: 7 (ref 5–15)
BUN: 34 mg/dL — ABNORMAL HIGH (ref 8–23)
CO2: 27 mmol/L (ref 22–32)
Calcium: 8.3 mg/dL — ABNORMAL LOW (ref 8.9–10.3)
Chloride: 104 mmol/L (ref 98–111)
Creatinine, Ser: 1.89 mg/dL — ABNORMAL HIGH (ref 0.44–1.00)
GFR, Estimated: 24 mL/min — ABNORMAL LOW (ref >60)
Glucose, Bld: 114 mg/dL — ABNORMAL HIGH (ref 70–99)
Potassium: 4.5 mmol/L (ref 3.5–5.1)
Sodium: 138 mmol/L (ref 135–145)

## 2020-01-28 LAB — PREPARE RBC (CROSSMATCH)

## 2020-01-28 LAB — RETICULOCYTES
Immature Retic Fract: 20.2 % — ABNORMAL HIGH (ref 2.3–15.9)
RBC.: 3.06 MIL/uL — ABNORMAL LOW (ref 3.87–5.11)
Retic Count, Absolute: 77.7 10*3/uL (ref 19.0–186.0)
Retic Ct Pct: 2.5 % (ref 0.4–3.1)

## 2020-01-28 LAB — IRON AND TIBC
Iron: 30 ug/dL (ref 28–170)
Saturation Ratios: 7 % — ABNORMAL LOW (ref 10.4–31.8)
TIBC: 416 ug/dL (ref 250–450)
UIBC: 386 ug/dL

## 2020-01-28 LAB — SEDIMENTATION RATE: Sed Rate: 28 mm/hr — ABNORMAL HIGH (ref 0–22)

## 2020-01-28 MED ORDER — ORAL CARE MOUTH RINSE
15.0000 mL | Freq: Two times a day (BID) | OROMUCOSAL | Status: DC
  Administered 2020-01-29 – 2020-02-01 (×6): 15 mL via OROMUCOSAL

## 2020-01-28 MED ORDER — SODIUM CHLORIDE 0.9% IV SOLUTION: Freq: Once | INTRAVENOUS | Status: AC

## 2020-01-28 MED ORDER — FERROUS SULFATE 325 (65 FE) MG PO TABS
325.0000 mg | ORAL_TABLET | Freq: Every day | ORAL | Status: DC
  Administered 2020-01-29 – 2020-02-01 (×4): 325 mg via ORAL
  Filled 2020-01-28 (×4): qty 1

## 2020-01-28 MED ORDER — SODIUM CHLORIDE 0.9 % IV SOLN
2.0000 g | INTRAVENOUS | Status: DC
  Administered 2020-01-28 – 2020-01-30 (×3): 2 g via INTRAVENOUS
  Filled 2020-01-28: qty 2
  Filled 2020-01-28 (×2): qty 20
  Filled 2020-01-28: qty 2

## 2020-01-28 NOTE — Progress Notes (Signed)
Patient off unit to CT at this time.  

## 2020-01-28 NOTE — Progress Notes (Signed)
Ct scan of abd./pelvis results relayed to Dr.Doda .

## 2020-01-28 NOTE — Progress Notes (Signed)
Family Medicine Teaching Service Daily Progress Note Intern Pager: [phone]  Patient name: [name] Medical record number: [id number] Date of birth: January 26, 1924 Age: 86 y.o. Gender: female  Primary Care Provider: McDiarmid, Blane Ohara, MD Consultants: None Code Status: Partial   Pt Overview and Major Events to Date:  11/28: admitted  Assessment and Plan:  [name] a 86 y.o.femalewho presented with 1 month of worsening shortness of breathand altered mental status. PMH is significant forCAD, a-fib, COPD, HFpEF, HTN, CKD Stage IV, Transitional cell carcinoma of ureters/p nephrectomy, and dementia.  Dyspnea on Exertion  Trace Pleural Effusions Patient stable on room air this morning with SpO2 in the mid 90s. Still with subjective shortness of breath. Echo essentially unchanged from prior echo (see result details below). Had 500cc urine output + 3 unmeasured occurences over past 24h. Possible component of volume overload vs. deconditioning related to severe COPD. Her anemia could also be contributing factor.  -Strict Is/Os, daily weights -Continue Lasix 40mg  daily -Continue home Albuterol nebs/inhaler -Supplemental O2 as needed, goal SpO2 88-92% -PT/OT eval and treat -Will transfuse 1u pRBCs for anemia (see below)  Declining Mental Status  Hx of Dementia Patient reports confusion that has been particularly bothersome to her over the past few weeks. Interestingly, she is very aware of this confusion which is atypical for dementia. No recent falls. Normal neuro exam including oriented to person, place and time. -Delirium precautions -Will discuss possible head imaging or medication therapy -Continue to monitor  Normocytic Anemia Hgb 7.6 this morning (7.7 yesterday, 8.6 on admission). Given patient's hx of CAD, transfusion threshold <8.0. Iron studies suggest iron deficiency (ferritin 31, iron 30, saturation ratio 7). -Will transfuse 1u pRBCs -Post-transfusion H&H -Will discuss  colonoscopy/EGD in the context of patient's goals of care -Will start ferrous sulfate daily -Daily CBC  Abdominal Soft Tissue Mass CT abd/pelvis with oral contrast showed 6.6 cm soft tissue mass intimately associated with the medial limb of the left adrenal gland and within the left nephrectomy bed. Differential includes primary adrenal mass, renal cell carcinoma, recurrence of transitional cell carcinoma, or pheochromocytoma.  -Will discuss goals of care and additional workup options with patient and family -Palliative consulted yesterday, appreciate assistance  CKD Stage IV  Hx of Transitional Cell Carcinoma of Ureter s/p Nephrectomy Chronic, stable. Cr 1.89, GFR 24.  -Avoid nephrotoxic meds -Daily BMP  HFpEF  Cor Pulmonale Echo showed EF 50-55%, LV low normal function with mild LV hypertrophy. Moderate tricuspid regurg and mitral regurg. Right ventricular systolic pressure 45. Overall, no significant change from prior echo.  -Continue Lasix 40mg  PO daily -Monitor volume status -Strict Is/Os, daily weights  Paroxysmal A-Fib Patient currently in rate-controlled a-fib. -Continue home Eliquis and Metoprolol -Cardiac telemetry  Severe COPD Chronic, stable.  -Continue home Trelegy and albuterol -Goal SpO2 88-92%  HLD Chronic, stable. -Continue home Atorvastatin 20mg  daily  FEN/GI: Regular diet PPx: Eliquis (for A-fib)   Status is: Inpatient Remains inpatient appropriate because:Ongoing diagnostic testing needed not appropriate for outpatient work up   Dispo: The patient is from: Home              Anticipated d/c is to: TBD- likely SNF              Anticipated d/c date is: 3 days              Patient currently is not medically stable to d/c.    Subjective:  No acute events overnight. Patient complains of ongoing dyspnea  this morning. Also states her confusion is particularly bothersome to her. She thinks she travelled to [place] and states it was very vivid., but also  realizes this is not true. She also thought her mother passed away recently and called a funeral home, however her mother passed away 25 years ago.  Objective: Temp:  [97.6 F (36.4 C)-97.8 F (36.6 C)] 97.8 F (36.6 C) (11/30 0557) Pulse Rate:  [27-127] 111 (11/30 0557) Resp:  [10-24] 24 (11/30 0557) BP: (96-140)/(72-101) 140/84 (11/30 0557) SpO2:  [97 %-100 %] 100 % (11/30 0557) Physical Exam: General: alert, non-toxic appearing, NAD Neck: no JVD appreciated Cardiovascular: irregularly irregular rhythm, normal S1/S2 without m/r/g Respiratory: normal WOB on room air, slight end-expiratory wheezes, no crackles appreciated Abdomen: soft, nontender, nondistended Extremities: 1-2+ pitting edema of bilateral lower extremities  Laboratory: Recent Labs  Lab 01/26/20 1343 01/27/20 0750 01/28/20 0258  WBC 6.1 5.0 5.8  HGB 8.6* 7.7* 7.6*  HCT 31.3* 27.5* 27.0*  PLT 209 191 178   Recent Labs  Lab 01/26/20 1343 01/27/20 0750 01/28/20 0258  NA 142 141 138  K 4.3 4.7 4.5  CL 107 107 104  CO2 24 23 27   BUN 34* 31* 34*  CREATININE 1.81* 1.85* 1.89*  CALCIUM 8.5* 8.4* 8.3*  PROT  --  5.9*  --   BILITOT  --  0.9  --   ALKPHOS  --  59  --   ALT  --  10  --   AST  --  12*  --   GLUCOSE 129* 96 114*     Imaging/Diagnostic Tests: CT ABDOMEN PELVIS WO CONTRAST Result Date: 01/27/2020 Adrenals/Urinary Tract: Left nephrectomy has been performed. Within the nephrectomy bed and intimately associated with the medial limb of the left adrenal gland is a soft tissue mass measuring 4.0 x 6.6 x 5.4 cm in greatest dimension. Differential considerations include recurrent renal cell carcinoma, if there is a known history of prior left renal malignancy necessitating nephrectomy, a primary adrenal mass, as al smaller mass was identified in this location on prior examination measuring 2.4 cm, or a new primary adrenal mass or metastatic adrenal mass (collision tumor). The right adrenal gland is  unremarkable. Residual right kidney is normal in size and position. 3 mm nonobstructing calculus is seen within the interpolar region. No hydronephrosis.   IMPRESSION: Interval development of a 6.6 cm soft tissue mass intimately associated with the medial limb of the left adrenal gland and within the left nephrectomy bed. Differential considerations are as listed above. Dedicated renal mass protocol CT or MRI examination is recommended for further characterization and better characterize the organ of origin. As pheochromocytoma is within the differential considerations, correlation with urine metanephrines may also be helpful, particularly if percutaneous biopsies be considered for further evaluation. Interval development of a 6.6 cm infrarenal abdominal aortic aneurysm. Recommend referral to a vascular specialist. This recommendation follows ACR consensus guidelines: White Paper of the ACR Incidental Findings Committee II on Vascular Findings. J Am Coll Radiol 2013; 10:789-794. 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 Aortic Atherosclerosis (ICD10-I70.0).    ECHOCARDIOGRAM COMPLETE Result Date: 01/27/2020 IMPRESSIONS   1. Left ventricular ejection fraction, by estimation, is 50 to 55%. The left ventricle has low normal function. The left ventricle has no regional wall motion abnormalities. The left ventricular internal cavity size was mildly dilated. There is mild left ventricular hypertrophy. Left ventricular diastolic parameters are indeterminate.   2. Right ventricular  systolic function is mildly reduced. The right ventricular size is normal. There is mildly elevated pulmonary artery systolic pressure. The estimated right ventricular systolic pressure is 27.8 mmHg.   3. Left atrial size was mildly dilated.   4. Right atrial size was mildly dilated.   5. The mitral valve is normal in structure. Mild to moderate mitral valve regurgitation.    6. Tricuspid valve regurgitation is moderate.   7. The aortic valve was not well visualized. Aortic valve regurgitation is not visualized. No aortic stenosis is present.   8. The inferior vena cava is dilated in size with >50% respiratory variability, suggesting right atrial pressure of 8 mmHg.    Alcus Dad, MD 01/28/2020, 7:42 AM PGY-1, [place] Intern pager: [phone], text pages welcome

## 2020-01-28 NOTE — Consult Note (Signed)
Consultation Note Date: 01/28/2020   Patient Name: [name]  DOB: December 28, 1923  MRN: [id number]  Age / Sex: 86 y.o., female  PCP: McDiarmid, Blane Ohara, MD Referring Physician: Lenoria Chime, MD  Reason for Consultation: Establishing goals of care  HPI/Patient Profile: 86 y.o. 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  Patient and family are not pursing work up for abdominal mass, knowing that regardless of the results they would not pursue treatment/treatment would not be recommended.   Patient and family face treatment option decisions, advanced directive decisions, and anticipatory care needs.   Clinical Assessment and Goals of Care: I have reviewed medical records including EPIC notes, labs, and imaging. Received report from primary RN - no acute concerns.   Went to visit patient at bedside - no family/visitors present. Patient was lying in bed asleep - she did wake to voice/gentle touch - was oriented and and able to participate in short conversation before falling back asleep. No signs or non-verbal gestures of pain or discomfort noted. No respiratory distress, increased work of breathing, or secretions noted. Patient denied pain. She is on 1L O2 [place].   Met with patient briefly before she fell asleep  to discuss diagnosis, prognosis, GOC, EOL wishes, disposition, and options.   I introduced Palliative Medicine as specialized medical care for people living with serious illness. It focuses on providing relief from the symptoms and stress of a serious illness. The goal is to improve quality of life for both the patient and the family.  I asked the patient if she was  willing/motivated work with PT several times a day at rehab - she shook her head no. When asked if she would like to go home with the goal to keep her comfortable, feeling as best she can for as long as she can with hospice, she stated "sounds good, that's what I want." Shortly after, the patient fell asleep and stopped responding to voice - I did not wake her again.  I called her daughter/[name] to discuss diagnosis, prognosis, GOC, EOL wishes, disposition, and options.   We discussed a brief life review of the patient as well as functional and nutritional status. [name] tells me that the patient's husband, unfortunately, passed away 24 years ago. They had 4 daughters together. Ms. Nesby was living in a private residence with one of her other daughters. [name] tells me the daughter the patient lives with is not always able to meet the patient's needs at the home. [name] reflected over the last 6 months, this is when she started to notice a considerable decline in the patient's mental and functional status - stating "she used to fix her hair, do her make up; now she's too weak to even walk to the kitchen or bathroom and is easily winded." For the last three months  the patient has been sleeping in a recliner - she is unable to get up the stairs in her two story house. The patient cannot perform ADLs independently and needs assistance with ambulating, bathing and preparing meals. [name] tells me that the patient has been eating and drinking well (I did note the patient's albumin has declined since August, 3.3 to 2.8 during this hospitalization).  We discussed patient's current illness and what it means in the larger context of patient's on-going co-morbidities. [name] has a clear understanding of the patient's current medical situation. [name] understands that COPD and dementia areprogressive, non-curable diseases underlying the patient's current acute medical conditions. She understands that treatment options  are limited for abdominal mass. Discussed GIB and whether she was interested in pursing invasive procedures for further evaluation depending on goals of care - at this time [name] is not interested. Natural disease trajectory and expectations at EOL were discussed in detail. I attempted to elicit values and goals of care important to the patient. The difference between aggressive medical intervention and comfort care was considered in light of the patient's goals of care. Education and discussion was provided around different disposition options. Discussed that the patient is high risk of rehospitalization without hospice support at discharge. [name] expressed that her hope was for the patient to go to rehab to get stronger to go home. Hospice and Palliative Care services outpatient were explained and offered. Discussed giving rehab a limited trial with reassessment - educated that outpatient PC could easily transition patient from rehab to hospice if that became their desire if rehab was not being of benefit. Explained that in the patient's current state, rehab may not be beneficial. Ultimately after a long discussion, [name] feels that rehab is not the best option for discharge, as her goal is for the patient to live as comfortable as she can for as long as she can. [name] wants the patient to be able to return home with hospice; her concern is getting the patient enough support in the home. Discussed supplementing hospice care with home health aids - she was interested in getting more information.  Advance directives were discussed - the patient does have a Living Will and HCPOA that lists [name] as her healthcare agent. [name] is unable to access the document until next Monday.   [name] asked for assistance speaking with her siblings - she states that two of them are on the same page, but the other two are having a hard time. [name] will call PMT back with date/time so we can have a family meeting with  siblings.   [name] was emotional during conversation today as we discussed transition to hospice care - emotional support was provided. Did not revisit code status discussion due to emotional intensity of conversation already had today.  Visit also consisted of discussions dealing with the complex and emotionally intense issues of symptom management and palliative care in the setting of serious and potentially life-threatening illness. Palliative care team will continue to support patient, patient's family, and medical team.  Discussed with patient/family the importance of continued conversation with each other and the medical providers regarding overall plan of care and treatment options, ensuring decisions are within the context of the patient's values and GOCs.    Questions and concerns were addressed. The patient/family was encouraged to call with questions and/or concerns. PMT number was provided.   Primary Decision Maker: PATIENT and daughter/[name] who is HCPOA - document can be found in Media tab on date 06/26/2009  under "Advanced Directives/Living will/HCPOA/POA"    SUMMARY OF RECOMMENDATIONS:  Continue current treatment for medical optimization  Continue Partial code as previously documented; needs continued code status discussion now that [place] have changed to hospice - did not get to discuss today due to intense emotions but she did express that she would not want to prolong the patient's life with heroic measures  [name]/HCPOA would like the patient to discharge home with hospice, requested [facility], instead of rehab facility - [facility] hospice liaison notified  [facility] notified and consult placed for: home hospice referral with [facility], as well as request for information on home health aids to supplement hospice at home  [name] will be at the patient's bedside tomorrow 12/1 from around 8:45-3pm if anyone needs to speak with her in person  [name] requested PMT help with family  conversation around transition to hospice - states two of her siblings are not on the same page - she will call with a time they can meet tomorrow  PMT will continue to follow holistically  Code Status/Advance Care Planning:  Limited code  Palliative Prophylaxis:   Aspiration, Bowel Regimen, Delirium Protocol, Frequent Pain Assessment, Oral Care and Turn Reposition  Additional Recommendations (Limitations, Scope, Preferences):  Full Scope Treatment and No Surgical Procedures  Psycho-social/Spiritual:   Desire for further Chaplaincy support:no Created space and opportunity for patient and family to express thoughts and feelings regarding patient's current medical situation.   Emotional support and therapeutic listening provided.  Prognosis:   < 6 months  Discharge Planning: Home with Hospice      Primary Diagnoses: Present on Admission: . Anemia . Altered mental status . Balance problem . Frailty . Ambulatory dysfunction . AAA (abdominal aortic aneurysm) without rupture ([place]) . Aortic calcification (HCC) . Atrial fibrillation ([place]), permanent . Chronic kidney disease (CKD), stage IV (severe) ([place]) . Venous stasis ulcers of both lower extremities ([place]) . COPD, severe ([place]) . Cor pulmonale, chronic ([place]) . Dementia ([place]), possible Alzhemier's type . Pacemaker . History of Iron deficiency anemia due to chronic GI blood loss . Tachycardia-bradycardia syndrome ([place]) . Obstipation   I have reviewed the medical record, interviewed the patient and family, and examined the patient. The following aspects are pertinent.  Past Medical History:  Diagnosis Date  . Abnormal mammogram, unspecified 08/23/2010   Followup imaging reassuring.   . Acute appendicitis with rupture   . Acute on chronic diastolic heart failure ([place]) 10/16/2017  . ADENOMATOUS COLONIC POLYP 03/01/2003   Qualifier: Diagnosis of  By: McDiarmid MD, Sherren Mocha Multiple benign polyps of cecum, ascending, transverse  and sigmoid colon by 8/09 colonoscopy by Dr Cristina Gong  Colonoscopy by Dr Cristina Gong for iron-deficiency anemia on 04/27/2010 showed three sessile polyps that were in ascending (3 mm x 9 mm), transverse (4 mm), and cecum (3 mm).  All three were tubular adenomas that were negative for high grade dysplasia or malignancy on pathology. Dr Cristina Gong called the polpys benign and not requiring follow-up in view of the patients age.    . Adrenal adenoma    Incidentaloma  . AF (paroxysmal atrial fibrillation) ([place]) 11/11/2010   Hospitalization (9/8-9/10, Dr Daneen Schick, III, Cardiology) for Paroxysmal Atrial Fibrillation with RVR and anginal pain secondary to demand/supply mismatch in setting of RVR with known circumflex artery branch disease.    Marland Kitchen AKI (acute kidney injury) ([place])   . ANXIETY 04/27/2006   Qualifier: Diagnosis of  By: McDiarmid MD, Sherren Mocha    . Benign essential tremor 02/04/2016  . Candidiasis of  the esophagus 11/26/2007  . CAP (community acquired pneumonia) 02/25/2015  . Cataract 2013   Bilateral   . Cellulitis of leg, right 10/01/2012  . Cellulitis of right lower extremity   . Chest pain with moderate risk of acute coronary syndrome 05/21/2015  . Chronic kidney disease (CKD), stage III (moderate) (HCC)   . Concussion with loss of consciousness   . COPD 04/27/2006   Qualifier: Diagnosis of  By: McDiarmid MD, Sherren Mocha    . COPD exacerbation ([place])   . COPD, severe   . Decreased functional mobility and endurance 03/17/2015  . Dementia [facility]), possible Alzhemier's type 10/26/2012   Montreal Cognitive Assessment (25 out of 30, points off in visuospatial and and short-term memory).  Independent in iADLs and ADLs.    . Diastolic heart failure ([place]) 07/30/2015  . DISC WITH RADICULOPATHY 04/27/2006   Qualifier: Diagnosis of  By: McDiarmid MD, Sherren Mocha    . Dyspnea   . EDEMA-LEGS,DUE TO VENOUS OBSTRUCT. 04/27/2006   Qualifier: Diagnosis of  By: McDiarmid MD, Sherren Mocha    . Fall 10/27/2016  . Fracture of 5th metatarsal, Right,  avulsion fx of tip styloid 11/09/2017  . GERD (gastroesophageal reflux disease) 11/08/2015  . 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 Normal uric acid serum level at time of attack.    . Hand trauma, right, initial encounter 03/20/2018  . HERNIA, HIATAL, NONCONGENITAL 04/27/2006   Qualifier: Diagnosis of  By: McDiarmid MD, Sherren Mocha    . High risk medications (not anticoagulants) long-term use 03/05/2012  . History of Hemorrhoids 04/27/2006   Qualifier: Diagnosis of  By: McDiarmid MD, Sherren Mocha    . History of iron deficiency 01/16/2015  . History of Iron deficiency anemia due to chronic GI blood loss 08/05/2010   Dr Cristina Gong (GI) has evaluated with EGD, colonoscopy, and video capsular endoscopy in 2011 & 2012.  All have been unrevealing as to an origin of IDA.  OV with Dr Cristina Gong (10/28/10) assessment of blood in stool per hemoccult and GER. Hbg 12.1 g/dL, MCV 91.8, Ferritin 30 ng/mL. Patient taking on ferrous sulfate tab daily.   EGD on 03/06/12 by Dr Cristina Gong for IDA non-obstructing Schatzki's ring at [place]  . Hx of colonoscopy with polypectomy 04/27/2010   Dr Cristina Gong found three  tubular adenomas each less than 10 mm size  . Hypertension   . Hypotension 07/09/2015  . Impaired mobility and ADLs 09/01/2017  . Iron deficiency anemia 08/05/2010   Dr Cristina Gong (GI) has evaluated with EGD, colonoscopy, and video capsular endoscopy in 2011 & 2012.  All have been unrevealing as to an origin of IDA.  OV with Dr Cristina Gong (10/28/10) assessment of blood in stool per hemoccult and GER. Hbg 12.1 g/dL, MCV 91.8, Ferritin 30 ng/mL. Patient taking on ferrous sulfate tab daily.   EGD on 03/06/12 by Dr Cristina Gong for IDA non-obstructing Schatzki's ring at Gastroesophageal junction, otherwise normal esophagus and stomach.     . Laceration of arm, unspecified laterality, initial encounter 11/03/2017  . Left leg cellulitis   . Left Metatarsal fracture, 5th, avulsion styloid  12/29/2017  . Leg cramps 05/29/2012  . Low back pain   . Lumbar herniated disc    History of HNP L4/5 in 2003  . Macular degeneration, bilateral 10/04/2010   Right eye is wet MD, the other is dry macular degeneration (ARMD). Pt undergoing some form of vascular endothelial growth factor inhibition intraocular therapy.    Marland Kitchen  Mild cognitive impairment 10/26/2012   (10/25/12) Failed MiniCog screen  . MUSCLE CRAMPS 03/11/2010   Qualifier: Diagnosis of  By: McDiarmid MD, Sherren Mocha    . Muscle spasm of back 09/24/2013  . Myocardial infarct, old   . Nocturia 10/28/2016  . Numbness and tingling in hands 07/21/2011  . Pacemaker    MDT  . Paroxysmal atrial fibrillation (HCC)   . Persistent atrial fibrillation ([place]) 05/31/2014  . PREDIABETES 09/11/2007   Qualifier: Diagnosis of  By: McDiarmid MD, Sherren Mocha    . Retinal hemorrhage of left eye 06/2010  . RHINITIS, ALLERGIC 04/27/2006   Qualifier: Diagnosis of  By: McDiarmid MD, Sherren Mocha    . SCHATZKI'S RING, HX OF 11/26/2007   Qualifier: Diagnosis of  By: McDiarmid MD, Sherren Mocha  An EGD was performed by Dr Cristina Gong on 04/27/2010 for iron deficiency anemia. There was a a transient hiatal hernia with Schatzki's ring. Stomach and duodenum were normal. EGD on 03/06/12 by Dr Cristina Gong for IDA non-obstructing Schatzki's ring at Gastroesophageal junction, otherwise normal esophagus and stomach.    . Seborrheic keratosis, right anterior thigh 12/12/2013  . Shoulder pain, left 01/09/2014  . SICK SINUS SYNDROME 04/27/2006   S/P dual chamber pacemaker placement by Dr Osie Cheeks (EPS-Card) with pacemaker lead extraction & reinsertion - 07/25/2003  Hospitalization (9/8-9/10, Dr Daneen Schick, III, Cardiology) for Paroxysmal Atrial Fibrillation with RVR and anginal pain secondary to demand/supply mismatch in setting of RVR with known circumflex artery branch disease.    . Sick sinus syndrome with tachycardia ([place])    MDT  . Soft tissue injury of foot 05/03/2011  . Solar lentigo 06/15/2012  . Solitary  kidney, acquired 05/17/2010   Surgical removal for transitional cell cancer by Tresa Endo, MD ([place]). Surveillance cystoscopy by Dr Alinda Money [facility]) on 10/19/12 without evidence of cystoscopic recurrence. Recommend RTC one year for cystoscopy.   Marland Kitchen Spinal stenosis, lumbar   . Transitional cell carcinoma of ureter, history   . Traumatic ecchymosis of left foot 12/07/2017  . Trigger point with neck pain 10/26/2018  . Urge incontinence 12/13/2011   Diagnosed in 10/2011 by Dr Bjorn Loser (Urology)   . Venous stasis ulcer of ankle, left ([place]) 10/24/2014  . VENTRICULAR HYPERTROPHY, LEFT 08/28/2008   Qualifier: Diagnosis of  By: McDiarmid MD, Sherren Mocha    . VITAMIN B12 DEFICIENCY 10/07/2009   Qualifier: Diagnosis of  By: McDiarmid MD, Sherren Mocha  Dx based on a post-TKR anemia work-up Low normal serum B12 with high Methylmalonic acid and homocysteine level  Vit B12 serum level (10/28/10) > 1500 pg/mL   . Vitamin D deficiency 11/02/2010   Serum vitamin D 25(OH) = 10.9 ng/mL (30 -100) on 10/28/10 c/w Vitamin D deficiency.     . Wound of left foot 12/29/2017   Social History   Socioeconomic History  . Marital status: Widowed    Spouse name: Not on file  . Number of children: 4  . Years of education: Not on file  . Highest education level: Not on file  Occupational History  . Occupation: Art therapist: RETIRED    Comment: Retired  Tobacco Use  . Smoking status: Former Smoker    Packs/day: 1.00    Types: Cigarettes    Quit date: 03/02/2009    Years since quitting: 10.9  . Smokeless tobacco: Never Used  Vaping Use  . Vaping Use: Never used  Substance and Sexual Activity  . Alcohol use: Yes    Alcohol/week: 13.0 standard drinks  Types: 7 Glasses of wine, 6 Standard drinks or equivalent per week    Comment: 5 oz white wine per day  . Drug use: No  . Sexual activity: Never  Other Topics Concern  . 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    Semi-retired Futures trader.     Pt has home nebulizer for inhalation therapies.   Former Smoker   Smoking Status:  quit > 5 years ago      (+) DNR status per discussion with Dr McDiarmid 10/25/12 and reiterated 06/20/13 office visit .   (+) Living Will/Advance Directive   (+) HC-POA: Cecile Sheerer Causey (pt's dgt)      Current Social History 09/22/2016        Patient lives with daughter, [name] (flight attendant), in two level home 09/22/2016   Transportation: Patient has own vehicle 09/22/2016   Important Relationships 4 daughters and 55 yo grandson 09/22/2016    Pets: None 09/22/2016   Education / Work:  College/ Retired Futures trader 09/22/2016   Interests / Fun: Reads 2 books per week, watches TV, entertains 09/22/2016   Current Stressors: 86 yo house in need of outside repairs 09/22/2016   Religious / Personal Beliefs: Joanie [place]dington 09/22/2016   Other: Had a wonderful marriage/ Very thankful/ wants to continue traveling/ closest friends are all gone 09/22/2016   L. [place], RN, BSN                                                                                                  Social Determinants of Health   Financial Resource Strain:   . Difficulty of Paying Living Expenses: Not on file  Food Insecurity:   . Worried About Charity fundraiser in the Last Year: Not on file  . Ran Out of Food in the Last Year: Not on file  Transportation Needs:   . Lack of Transportation (Medical): Not on file  . Lack of Transportation (Non-Medical): Not on file  Physical Activity:   . Days of Exercise per Week: Not on file  . Minutes of Exercise per Session: Not on file  Stress:   . Feeling of Stress : Not on file  Social Connections:   . Frequency of Communication with Friends and Family: Not on file  . Frequency of Social Gatherings with Friends  and Family: Not on file  . Attends Religious Services: Not on file  . Active Member of Clubs or Organizations: Not on file  . Attends Archivist Meetings: Not on file  . Marital Status: Not on file   Family History  Problem Relation Age of Onset  . Dementia Mother   . Heart disease Father   . Heart attack Father    Scheduled Meds: . apixaban  2.5 mg Oral BID  . atorvastatin  20 mg Oral Daily  . [START ON 01/29/2020] ferrous  sulfate  325 mg Oral Q breakfast  . fluticasone furoate-vilanterol  1 puff Inhalation Daily   And  . umeclidinium bromide  1 puff Inhalation Daily  . furosemide  40 mg Oral Daily  . metoprolol succinate  37.5 mg Oral Daily  . pantoprazole  40 mg Oral Daily  . polyethylene glycol  17 g Oral Daily   Continuous Infusions: . cefTRIAXone (ROCEPHIN)  IV     PRN Meds:.albuterol, fluticasone Medications Prior to Admission:  Prior to Admission medications   Medication Sig Start Date End Date Taking? Authorizing Provider  albuterol (PROVENTIL) (2.5 MG/3ML) 0.083% nebulizer solution USE 1 VIAL VIA NEBULIZER EVERY 4 HOURS AS NEEDED FOR WHEEZING OR SHORTNESS OF BREATH Patient taking differently: Take 2.5 mg by nebulization every 4 (four) hours as needed for wheezing or shortness of breath.  04/16/18  Yes Collene Gobble, MD  albuterol (VENTOLIN HFA) 108 (90 Base) MCG/ACT inhaler Inhale 2 puffs into the lungs every 4 (four) hours as needed for wheezing or shortness of breath. 09/11/18  Yes Collene Gobble, MD  atorvastatin (LIPITOR) 20 MG tablet TAKE 1 TABLET BY MOUTH DAILY Patient taking differently: Take 20 mg by mouth daily.  03/18/19  Yes McDiarmid, Blane Ohara, MD  ELIQUIS 2.5 MG TABS tablet TAKE 1 TABLET BY MOUTH TWICE DAILY Patient taking differently: Take 2.5 mg by mouth 2 (two) times daily.  01/10/20  Yes Belva Crome, MD  fluticasone (FLONASE) 50 MCG/ACT nasal spray SHAKE LIQUID AND USE 2 SPRAYS IN EACH NOSTRIL DAILY Patient taking differently: Place 2 sprays  into both nostrils daily as needed for allergies or rhinitis.  12/04/18  Yes Collene Gobble, MD  Fluticasone-Umeclidin-Vilant (TRELEGY ELLIPTA) 100-62.5-25 MCG/INH AEPB Inhale 1 puff into the lungs daily. 07/18/19  Yes Collene Gobble, MD  furosemide (LASIX) 40 MG tablet Take one tablet (50m) by mouth daily for 3 days, then take 1/2 tablet (20 mg) by mouth daily until all swelling/fluid is off, then 1/2 tablet (20 mg) by mouth only as needed thereafter Patient taking differently: Take 40 mg by mouth daily as needed for fluid or edema.  12/25/19  Yes PFreada Bergeron MD  metoprolol succinate (TOPROL XL) 25 MG 24 hr tablet Take 1.5 tablets (37.5 mg total) by mouth daily. 12/03/19  Yes Furth, Cadence H, PA-C  nitroGLYCERIN (NITROSTAT) 0.4 MG SL tablet Place 1 tablet (0.4 mg total) under the tongue every 5 (five) minutes as needed for chest pain (x 3 tabs). Patient taking differently: Place 0.4 mg under the tongue every 5 (five) minutes as needed for chest pain. May take up to 3 tablets. 11/16/15  Yes McDiarmid, TBlane Ohara MD  omeprazole (PRILOSEC) 20 MG capsule TAKE 1 CAPSULE(20 MG) BY MOUTH DAILY Patient taking differently: Take 20 mg by mouth daily.  07/18/19  Yes McDiarmid, TBlane Ohara MD  traMADol (ULTRAM) 50 MG tablet TAKE 1 TABLET BY MOUTH EVERY 6 HOURS AS NEEDED FOR PAIN Patient taking differently: Take 50 mg by mouth every 6 (six) hours as needed for moderate pain or severe pain.  06/05/19  Yes McDiarmid, TBlane Ohara MD  Spacer/Aero-Holding Chambers (AEROCHAMBER MV) inhaler Use as instructed 07/18/19   McDiarmid, TBlane Ohara MD   Allergies  Allergen Reactions  . Baclofen Other (See Comments)    Confusion occurred with taking 3 at the same time  . Codeine Phosphate Nausea Only  . Hydrochlorothiazide Other (See Comments)    Possible acute gout or arthritis of wrist  . Montelukast Sodium  Other (See Comments)    Reaction not recalled ??  Lebron Quam [Hydrocodone-Acetaminophen] Nausea And Vomiting  . Penicillins  Other (See Comments)    Unknown, just knows she allergic   Review of Systems  Unable to perform ROS: Mental status change    Physical Exam Vitals and nursing note reviewed.  Constitutional:      General: She is not in acute distress.    Appearance: She is ill-appearing.     Comments: Frail appearing  Pulmonary:     Effort: No respiratory distress.  Skin:    General: Skin is warm and dry.  Neurological:     Mental Status: She is lethargic.     Motor: Weakness present.  Psychiatric:        Behavior: Behavior is cooperative.     Vital Signs: BP 111/60   Pulse 72   Temp (!) 97.5 F (36.4 C) (Oral)   Resp 18   Ht 5' 5" (1.651 m)   Wt 82.1 kg   SpO2 97%   BMI 30.12 kg/m  Pain Scale: 0-10   Pain Score: 0-No pain   SpO2: SpO2: 97 % O2 Device:SpO2: 97 % O2 Flow Rate: .O2 Flow Rate (L/min): 2 L/min  IO: Intake/output summary:   Intake/Output Summary (Last 24 hours) at 01/28/2020 1718 Last data filed at 01/28/2020 1300 Gross per 24 hour  Intake --  Output 300 ml  Net -300 ml    LBM: Last BM Date: 01/26/20 (pta) Baseline Weight: Weight: 81.2 kg Most recent weight: Weight: 82.1 kg     Palliative Assessment/Data: PPS 30%     Time In: 1630 Time Out: 1745 Time Total: 75 minutes  Greater than 50%  of this time was spent counseling and coordinating care related to the above assessment and plan.  Signed by: Lin Landsman, NP   Please contact Palliative Medicine Team phone at [phone] for questions and concerns.  For individual provider: See Shea Evans

## 2020-01-28 NOTE — TOC Progression Note (Addendum)
Transition of Care [facility]) - Progression Note    Patient Details  Name: [name] MRN: [id number] Date of Birth: 26-Jul-1923  [facility]) CM/SW [place], [employer] Phone Number: 01/28/2020, 4:33 PM  Clinical Narrative:     Update 11/30 4:54pm- CSW spoke with patients daughter [name] plan is to speak with palliative today. DC plans may change.  CSW will continue to follow.  CSW will continue to follow  CSW called patients daughter [name] to try and provide SNF bed offers for patient. CSW awaiting callback.  CSW will continue to follow.   Expected Discharge Plan: [place] Barriers to Discharge: Continued Medical Work up  Expected Discharge Plan and Services Expected Discharge Plan: [place] arrangements for the past 2 months: Single Family Home                                       Social Determinants of Health (SDOH) Interventions    Readmission Risk Interventions No flowsheet data found.

## 2020-01-28 NOTE — Progress Notes (Signed)
Patient back to unit at this time.

## 2020-01-28 NOTE — Chronic Care Management (AMB) (Signed)
Care Management   Clinical Social Work initial Note  01/28/2020 Name: [name] MRN: [id number] DOB: 1924/01/31 [name] is a 86 y.o. year old female who sees McDiarmid, Tammy Ohara, MD for primary care. The Care Management team was consulted by PCP to assist the patient's daughter  with Level of Care Concerns and support in the home.  LCSW reached out to [name] today by phone to introduce self, assess needs and barriers to care.    Assessment: Patient is currently in the hospital and will be discharged to skilled nursing for rehab. No CCM care plan established in this encounter, will follow hospitals discharge plan. Concerns during this encounter:  Possible hospice or palliative care consultation from hospital Recommendation: Patient may benefit from, and daughter is in agreement to explore all options to provide additional support for her mother. ( private duty nursing and long term care as well as applying for Medicaid for supplement long term care options.  Intervention: :Motivational Interviewing;Solution-Focused Strategies and Emotional Support. Plan:  LCSW will f/u with patient's daughter in 3 weeks.  Will establish care plan goals during that time if needed.   Advance Directive Status: N Per [name] she is is HPOA SDOH (Social Determinants of Health) assessments performed: ; No new needs identified  Facility-Administered Encounter Medications as of 01/28/2020  Medication  . albuterol (PROVENTIL) (2.5 MG/3ML) 0.083% nebulizer solution 2.5 mg  . apixaban (ELIQUIS) tablet 2.5 mg  . atorvastatin (LIPITOR) tablet 20 mg  . fluticasone (FLONASE) 50 MCG/ACT nasal spray 2 spray  . fluticasone furoate-vilanterol (BREO ELLIPTA) 100-25 MCG/INH 1 puff   And  . umeclidinium bromide (INCRUSE ELLIPTA) 62.5 MCG/INH 1 puff  . furosemide (LASIX) tablet 40 mg  . metoprolol succinate (TOPROL-XL) 24 hr tablet 37.5 mg  . pantoprazole (PROTONIX) EC tablet 40 mg  . polyethylene glycol (MIRALAX /  GLYCOLAX) packet 17 g   Outpatient Encounter Medications as of 01/28/2020  Medication Sig Note  . albuterol (PROVENTIL) (2.5 MG/3ML) 0.083% nebulizer solution USE 1 VIAL VIA NEBULIZER EVERY 4 HOURS AS NEEDED FOR WHEEZING OR SHORTNESS OF BREATH (Patient taking differently: Take 2.5 mg by nebulization every 4 (four) hours as needed for wheezing or shortness of breath. )   . albuterol (VENTOLIN HFA) 108 (90 Base) MCG/ACT inhaler Inhale 2 puffs into the lungs every 4 (four) hours as needed for wheezing or shortness of breath.   Marland Kitchen atorvastatin (LIPITOR) 20 MG tablet TAKE 1 TABLET BY MOUTH DAILY (Patient taking differently: Take 20 mg by mouth daily. )   . ELIQUIS 2.5 MG TABS tablet TAKE 1 TABLET BY MOUTH TWICE DAILY (Patient taking differently: Take 2.5 mg by mouth 2 (two) times daily. )   . fluticasone (FLONASE) 50 MCG/ACT nasal spray SHAKE LIQUID AND USE 2 SPRAYS IN EACH NOSTRIL DAILY (Patient taking differently: Place 2 sprays into both nostrils daily as needed for allergies or rhinitis. )   . Fluticasone-Umeclidin-Vilant (TRELEGY ELLIPTA) 100-62.5-25 MCG/INH AEPB Inhale 1 puff into the lungs daily.   . furosemide (LASIX) 40 MG tablet Take one tablet (40mg ) by mouth daily for 3 days, then take 1/2 tablet (20 mg) by mouth daily until all swelling/fluid is off, then 1/2 tablet (20 mg) by mouth only as needed thereafter (Patient taking differently: Take 40 mg by mouth daily as needed for fluid or edema. )   . metoprolol succinate (TOPROL XL) 25 MG 24 hr tablet Take 1.5 tablets (37.5 mg total) by mouth daily.   . nitroGLYCERIN (  NITROSTAT) 0.4 MG SL tablet Place 1 tablet (0.4 mg total) under the tongue every 5 (five) minutes as needed for chest pain (x 3 tabs). (Patient taking differently: Place 0.4 mg under the tongue every 5 (five) minutes as needed for chest pain. May take up to 3 tablets.) 01/26/2020: Emergency med  . omeprazole (PRILOSEC) 20 MG capsule TAKE 1 CAPSULE(20 MG) BY MOUTH DAILY (Patient taking  differently: Take 20 mg by mouth daily. )   . Spacer/Aero-Holding Chambers (AEROCHAMBER MV) inhaler Use as instructed   . traMADol (ULTRAM) 50 MG tablet TAKE 1 TABLET BY MOUTH EVERY 6 HOURS AS NEEDED FOR PAIN (Patient taking differently: Take 50 mg by mouth every 6 (six) hours as needed for moderate pain or severe pain. )     Review of patient status, including review of consultants reports, relevant laboratory and other test results, and collaboration with appropriate care team members and the patient's provider was performed as part of comprehensive patient evaluation and provision of chronic care management services.       Information about Care Management services was shared with Ms.  Stewart today including:  1. Care Management services include personalized support from designated clinical staff supervised by her physician, including individualized plan of care and coordination with other care providers 2. Remind patient of 24/7 contact phone numbers to provider's office for assistance with urgent and routine care needs. 3. Care Management services are voluntary and patient may stop at any time .  Patient's daughter agreed to services provided today and verbal consent obtained.     [name], [place] / [place]   [phone] 11:58 AM

## 2020-01-28 NOTE — Discharge Summary (Signed)
[facility] Discharge Summary  Patient name: [name] Medical record number: [id number] Date of birth: [birth year] Age: 86 y.o. Gender: female Date of Admission: 01/26/2020  Date of Discharge: 02/01/2020 Admitting Physician: Lenoria Chime, MD  Primary Care Provider: McDiarmid, Blane Ohara, MD Consultants: Palliative Care  Indication for Hospitalization: dyspnea  Discharge Diagnoses/Problem List:  Dyspnea on exertion History of dementia E. coli bacteremia Constipation Abdominal soft tissue mass Normocytic anemia Paroxysmal atrial fibrillation Chronic kidney disease Hyperlipidemia  Disposition: Home with home hospice  Discharge Condition: Stable  Discharge Exam:   Physical Exam:  General: 86 y.o. female in NAD, pleasant Cardio: irregularly irregular Lungs: occasional mild wheeze throughout lung fields, on 3L O2 per [place] Abdomen: Soft, non-tender to palpation Skin: warm and dry Extremities: 1+ pitting edema BLE  Brief Hospital Course:  [name] is a 86 y.o. female who presented with 1 month of worsening shortness of breath and altered mental status. 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  Dyspnea on Exertion  Trace Pleural Effusions Patient presented with subjective dyspnea, particularly on exertion. She was intermittently placed on O2 for comfort, up to 3L [place]. Workup revealed trace bilateral pleural effusions on CT chest. She was treated with 40mg  PO Lasix and her home Trelegy and Albuterol for COPD. Patient discharged with home hospice. DME orders placed and received prior to d/c.   Altered Mental Status  Hx of Dementia Patient presented with 1 month of increased confusion and intermittently altered mental status (thinking she went to [place], calling the funeral home for her mother who passed away 25 years ago). Head CT negative for acute changes  but showed generalized atrophy and chronic microvascular ischemia. Patient subsequently found to be bacteremic with E. Coli, thought to be from a urinary source though no urine culture was obtained prior to starting antibiotics. Patient did become more alert after initiation of antibiotics.   E. Coli bacteremia  Patient with blood cultures obtained on admission. One tube from right antecubital fossa returned positive for E. Coli. Patient was started on Rocephin on 11/30. Received 3 total doses. Sensitivities showed resistance to ampicillin and ampicillin/sulbactam. Patient narrowed to Cefdinir on 12/3. Discharged on abx to complete for total duration of 7 days which she will complete on 12/6.  Constipation Patient with complaints of bloating, gas and constipation for several days. Bowel regimen included MiraLAX daily initially but was increased to BID with addition of senna on 12/1. Patient was able to have bowel movement with this regimen.   She was continued with this on discharge.  Abdominal Soft Tissue Mass CT abd/pelvis showed 6.6cm soft tissue mass intimately associated with the medial limb of the left adrenal gland, concerning for possible malignancy. Patient and her family decided not to pursue additional workup for this. Patient was discharged with home hospice.  Normocytic Anemia Patient's hgb 8.6 on admission, but dropped to 7.7 the following day. She was transfused 1u pRBCs on 11/30 (transfusion threshold 8.0 due to CAD). Iron studies consistent with iron deficiency anemia. She was started on ferrous sulfate 325mg  daily on 11/30. Patient and her family opted not to pursue workup with colonoscopy, EGD, etc. Follow up CBC on 12/2 revealed improved hemoglobin at 8.8.   After discussing with family, they plan to continue ferrous sulfate for now and will readdress in the future.  The remainder of patient's chronic medical problems (HLD, paroxysmal a-fib,  CKD) remained stable throughout her  admission.    Issues for Follow Up:  1. Consider CBC check at follow-up visit 2. Continue discussion about goals of care/code status- patient partial code at time of discharge 3. Per discussion with outpatient cardiologist, family will discontinue Eliquis after finishing what the patient has at home, then will give her only ASA 81mg . 4. She will continue ferrous sulfate for now, but will continue to readdress the need, especially given chronic constipation. 5. She was on Lasix 40mg  daily in the hospital, had previously only been taking as needed.  At discharge, was advised to continue daily.  Please re-assess continued need. 6. She was discharged home on Miralax and senna for constipation.  Please f/u constipation. 7. She will complete course of cefdinir for E coli bacteremia after her dose on 12/6.  Significant Procedures: Received 1 unit of packed red blood cells  Significant Labs and Imaging:  Recent Labs  Lab 01/28/20 0258 01/29/20 0335 01/30/20 0112  WBC 5.8 5.3 5.3  HGB 7.6* 8.3* 8.8*  HCT 27.0* 29.5* 30.6*  PLT 178 166 171   Recent Labs  Lab 01/26/20 1343 01/26/20 1343 01/27/20 0750 01/27/20 0750 01/28/20 0258 01/29/20 0734  NA 142  --  141  --  138 142  K 4.3   < > 4.7   < > 4.5 4.8  CL 107  --  107  --  104 104  CO2 24  --  23  --  27 27  GLUCOSE 129*  --  96  --  114* 92  BUN 34*  --  31*  --  34* 35*  CREATININE 1.81*  --  1.85*  --  1.89* 1.75*  CALCIUM 8.5*  --  8.4*  --  8.3* 8.8*  PHOS  --   --   --   --   --  4.2  ALKPHOS  --   --  59  --   --   --   AST  --   --  12*  --   --   --   ALT  --   --  10  --   --   --   ALBUMIN  --   --  2.8*  --   --  2.7*   < > = values in this interval not displayed.    Results/Tests Pending at Time of Discharge: None  Discharge Medications:  Allergies as of 02/01/2020      Reactions   Baclofen Other (See Comments)   Confusion occurred with taking 3 at the same time   Codeine Phosphate Nausea Only    Hydrochlorothiazide Other (See Comments)   Possible acute gout or arthritis of wrist   Montelukast Sodium Other (See Comments)   Reaction not recalled ??   Norco [hydrocodone-acetaminophen] Nausea And Vomiting   Penicillins Other (See Comments)   Unknown, just knows she allergic      Medication List    STOP taking these medications   Eliquis 2.5 MG Tabs tablet Generic drug: apixaban   traMADol 50 MG tablet Commonly known as: ULTRAM     TAKE these medications   AeroChamber MV inhaler Use as instructed   albuterol (2.5 MG/3ML) 0.083% nebulizer solution Commonly known as: PROVENTIL USE 1 VIAL VIA NEBULIZER EVERY 4 HOURS AS NEEDED FOR WHEEZING OR SHORTNESS OF BREATH What changed: See the new instructions.   albuterol 108 (90 Base) MCG/ACT inhaler Commonly known as: VENTOLIN HFA Inhale 2 puffs into  the lungs every 4 (four) hours as needed for wheezing or shortness of breath. What changed: Another medication with the same name was changed. Make sure you understand how and when to take each.   atorvastatin 20 MG tablet Commonly known as: LIPITOR TAKE 1 TABLET BY MOUTH DAILY   cefdinir 300 MG capsule Commonly known as: OMNICEF Take 1 capsule (300 mg total) by mouth daily for 2 days.   ferrous sulfate 325 (65 FE) MG tablet Take 1 tablet (325 mg total) by mouth daily with breakfast.   fluticasone 50 MCG/ACT nasal spray Commonly known as: FLONASE SHAKE LIQUID AND USE 2 SPRAYS IN EACH NOSTRIL DAILY What changed:   how much to take  how to take this  when to take this  reasons to take this  additional instructions   furosemide 40 MG tablet Commonly known as: LASIX Take 1 tablet (40 mg total) by mouth daily as needed for fluid or edema.   metoprolol succinate 25 MG 24 hr tablet Commonly known as: Toprol XL Take 1 tablet (25 mg total) by mouth daily. Start taking on: February 02, 2020 What changed: how much to take   nitroGLYCERIN 0.4 MG SL tablet Commonly known  as: NITROSTAT Place 1 tablet (0.4 mg total) under the tongue every 5 (five) minutes as needed for chest pain (x 3 tabs). What changed:   reasons to take this  additional instructions   omeprazole 20 MG capsule Commonly known as: PRILOSEC TAKE 1 CAPSULE(20 MG) BY MOUTH DAILY What changed: See the new instructions.   polyethylene glycol 17 g packet Commonly known as: MIRALAX / GLYCOLAX Take 17 g by mouth 2 (two) times daily.   senna 8.6 MG Tabs tablet Commonly known as: SENOKOT Take 1 tablet (8.6 mg total) by mouth daily.   Trelegy Ellipta 100-62.5-25 MCG/INH Aepb Generic drug: Fluticasone-Umeclidin-Vilant Inhale 1 puff into the lungs daily.       Discharge Instructions: Please refer to Patient Instructions section of EMR for full details.  Patient was counseled important signs and symptoms that should prompt return to medical care, changes in medications, dietary instructions, activity restrictions, and follow up appointments.   Follow-Up Appointments:  Follow-up Information    [place] Follow up.   Specialty: Hospice and Palliative Medicine Why: Registered Nurse- [place] to call with visit times.  Contact information: [place] [id number] [place], [place], DO 02/01/2020, 11:03 AM PGY-3, [place]

## 2020-01-28 NOTE — Progress Notes (Signed)
Patient is a mouth breather, States drop below 90 when sleeping. RRT contacted to bring patient simple mask to use while sleeping. Will continue to monitor at this time.

## 2020-01-28 NOTE — Patient Instructions (Signed)
  [name]  it was nice speaking with your daughter today. Please call me directly [phone] if you have questions   [name] received Care Management services today:  1. Care Management services include personalized support from designated clinical staff supervised by her physician, including individualized plan of care and coordination with other care providers 2. 24/7 contact [phone] for assistance for urgent and routine care needs. 3. Care Management are voluntary services and be declined at any time by calling the office. Patient's daughter Haze Justin understanding of instructions provided today.    Follow up plan: SW will follow up with patient by phone over the next 3 weeks  Maurine Cane, LCSW

## 2020-01-28 NOTE — Hospital Course (Addendum)
[name] is a 86 y.o. female who presented with 1 month of worsening shortness of breath and altered mental status. 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  Dyspnea on Exertion  Trace Pleural Effusions Patient presented with subjective dyspnea, particularly on exertion. She was intermittently placed on O2 for comfort, up to 3L [place]. Workup revealed trace bilateral pleural effusions on CT chest. She was treated with 40mg  PO Lasix and her home Trelegy and Albuterol for COPD. Patient discharged with home hospice. DME orders placed and received prior to d/c.   Altered Mental Status  Hx of Dementia Patient presented with 1 month of increased confusion and intermittently altered mental status (thinking she went to [place], calling the funeral home for her mother who passed away 25 years ago). Head CT negative for acute changes but showed generalized atrophy and chronic microvascular ischemia. Patient subsequently found to be bacteremic with E. Coli, thought to be from a urinary source though no urine culture was obtained prior to starting antibiotics. Patient did become more alert after initiation of antibiotics.   E. Coli bacteremia  Patient with blood cultures obtained on admission. One tube from right antecubital fossa returned positive for E. Coli. Patient was started on Rocephin on 11/30. Received 3 total doses. Sensitivities showed resistance to ampicillin and ampicillin/sulbactam. Patient narrowed to Cefdinir on 12/3. Discharged on abx to complete for total duration of 7 days.***  Constipation Patient with complaints of bloating, gas and constipation for several days. Bowel regimen included MiraLAX daily initially but was increased to BID with addition of senna on 12/1. Patient was able to have bowel movement with this regimen.   Abdominal Soft Tissue Mass CT abd/pelvis showed 6.6cm soft tissue mass  intimately associated with the medial limb of the left adrenal gland, concerning for possible malignancy. Patient and her family decided not to pursue additional workup for this. Patient was discharged with home hospice.  Normocytic Anemia Patient's hgb 8.6 on admission, but dropped to 7.7 the following day. She was transfused 1u pRBCs on 11/30 (transfusion threshold 8.0 due to CAD). Iron studies consistent with iron deficiency anemia. She was started on ferrous sulfate 325mg  daily on 11/30. Patient and her family opted not to pursue workup with colonoscopy, EGD, etc. Follow up CBC on 12/2 revealed improved hemoglobin at 8.8.   The remainder of patient's chronic medical problems (HLD, paroxysmal a-fib, CKD) remained stable throughout her admission.

## 2020-01-28 NOTE — Plan of Care (Signed)
  Problem: Activity: Goal: Capacity to carry out activities will improve 01/28/2020 0018 by Sheran Luz, RN Outcome: Progressing 01/28/2020 0017 by Sheran Luz, RN Outcome: Progressing

## 2020-01-28 NOTE — Progress Notes (Signed)
Social Visit I have been the PCP for [name] for over 20 years.  She has been a vital, active person, with a slow steady decline in function over last several years that has seemed to accelerate over the last 6 months or so such that she is mobile with assistance.  She experienced a rapid increase in her breathlessness with exertion over the last month.   [name] is quite frail now with a reduced ambulation and activity, and requiring assistance with many ADLs.  Workup of [name] so far has revealed a massin left renal bed, aortic aneurysm 6 cm,  small right pleural effusion and Anemia, likely iron deficient given the ferritin under 45 with ESR normal for patients age.   PT has found balance and mobility impairments, generalized weakness, and impaired endurance. They are recommending short-term SNF rehab.   Dr Agustina Caroli discussion with [name] about code status is greatly appreciated.  [name] has chosen a partial code statue (DNI, No CPR, may administer cardioversion or cardiac medications for   I spoke the patient's HC-POA, her daughter [name]. We spoke about the her mother's frail state that may make effective treatment of a possible malignancy difficult.  We also spoke about the role of Home Hospice in keeping her mother at home which is the patient and family's desire.   I believe that [name] COPD is a hospice-qualifying diagnosis with a life expectancy of less than 6 months.   [name] may benefit with an ambulatory SaO2 testing to qualify for home oxygen therapy.  Consider restarting iron therapy for [name].

## 2020-01-29 ENCOUNTER — Telehealth: Payer: Self-pay | Admitting: Family Medicine

## 2020-01-29 DIAGNOSIS — B9620 Unspecified Escherichia coli [E. coli] as the cause of diseases classified elsewhere: Secondary | ICD-10-CM | POA: Diagnosis not present

## 2020-01-29 DIAGNOSIS — F05 Delirium due to known physiological condition: Secondary | ICD-10-CM | POA: Diagnosis present

## 2020-01-29 DIAGNOSIS — R7881 Bacteremia: Secondary | ICD-10-CM | POA: Diagnosis not present

## 2020-01-29 LAB — TYPE AND SCREEN
ABO/RH(D): A POS
Antibody Screen: NEGATIVE
Unit division: 0

## 2020-01-29 LAB — BPAM RBC
Blood Product Expiration Date: 202112302359
ISSUE DATE / TIME: 202111301503
Unit Type and Rh: 6200

## 2020-01-29 LAB — CBC
HCT: 29.5 % — ABNORMAL LOW (ref 36.0–46.0)
Hemoglobin: 8.3 g/dL — ABNORMAL LOW (ref 12.0–15.0)
MCH: 25.8 pg — ABNORMAL LOW (ref 26.0–34.0)
MCHC: 28.1 g/dL — ABNORMAL LOW (ref 30.0–36.0)
MCV: 91.6 fL (ref 80.0–100.0)
Platelets: 166 10*3/uL (ref 150–400)
RBC: 3.22 MIL/uL — ABNORMAL LOW (ref 3.87–5.11)
RDW: 15.9 % — ABNORMAL HIGH (ref 11.5–15.5)
WBC: 5.3 10*3/uL (ref 4.0–10.5)
nRBC: 0 % (ref 0.0–0.2)

## 2020-01-29 LAB — RENAL FUNCTION PANEL
Albumin: 2.7 g/dL — ABNORMAL LOW (ref 3.5–5.0)
Anion gap: 11 (ref 5–15)
BUN: 35 mg/dL — ABNORMAL HIGH (ref 8–23)
CO2: 27 mmol/L (ref 22–32)
Calcium: 8.8 mg/dL — ABNORMAL LOW (ref 8.9–10.3)
Chloride: 104 mmol/L (ref 98–111)
Creatinine, Ser: 1.75 mg/dL — ABNORMAL HIGH (ref 0.44–1.00)
GFR, Estimated: 27 mL/min — ABNORMAL LOW (ref >60)
Glucose, Bld: 92 mg/dL (ref 70–99)
Phosphorus: 4.2 mg/dL (ref 2.5–4.6)
Potassium: 4.8 mmol/L (ref 3.5–5.1)
Sodium: 142 mmol/L (ref 135–145)

## 2020-01-29 MED ORDER — POLYETHYLENE GLYCOL 3350 17 G PO PACK
17.0000 g | PACK | Freq: Two times a day (BID) | ORAL | Status: DC
  Administered 2020-01-29 – 2020-02-01 (×5): 17 g via ORAL
  Filled 2020-01-29 (×7): qty 1

## 2020-01-29 MED ORDER — GLYCERIN (LAXATIVE) 2.1 G RE SUPP
1.0000 | RECTAL | Status: DC | PRN
  Filled 2020-01-29: qty 1

## 2020-01-29 MED ORDER — GLYCERIN (LAXATIVE) 2.1 G RE SUPP
1.0000 | Freq: Once | RECTAL | Status: DC
  Filled 2020-01-29: qty 1

## 2020-01-29 MED ORDER — SENNA 8.6 MG PO TABS
1.0000 | ORAL_TABLET | Freq: Every day | ORAL | Status: DC
  Administered 2020-01-29 – 2020-02-01 (×4): 8.6 mg via ORAL
  Filled 2020-01-29 (×4): qty 1

## 2020-01-29 NOTE — Care Management (Signed)
1726 01-29-20 Case Manager received consult to speak with daughter [name] regarding support with personal care services (PCS). Case Manager called [name] and she states that palliative will meet with the family on 01-30-20 for a family meeting and she has not had the opportunity to look into personal care. [name] will call Case Manager if she needs further assistance regarding PCS. Case Manager will continue to follow for additional transition of care needs. Bethena Roys, RN,BSN Case Manager

## 2020-01-29 NOTE — Progress Notes (Signed)
Family Medicine Teaching Service Daily Progress Note Intern Pager: [phone]  Patient name: [name] Medical record number: [id number] Date of birth: 08-Aug-1923 Age: 86 y.o. Gender: female  Primary Care Provider: McDiarmid, Blane Ohara, MD Consultants: Palliative Care Code Status: Partial  Pt Overview and Major Events to Date:  11/28: admitted 11/30: Palliative consult  Assessment and Plan: [name] a 86 y.o.femalewho presentedwith 1 month of worsening shortness of breathand altered mental status. PMH is significant forCAD, a-fib, COPD, HFpEF, HTN, CKD Stage IV, Transitional cell carcinoma of ureters/p nephrectomy,anddementia.  Declining Mental State  Hx of Dementia: chronic, stable Head CT with generalized atrophy and chronic microvascular ischemia, no acute findings. Palliative care met with patient yesterday to discuss goals of care. Though HCPOA, [name], was not physically present palliative care NP was able to discuss with her. It appears that family meeting will be held tomorrow to ensure that all siblings are on the same page but will likely move towards discharge home with hospice. - Palliative team on board  - F/u family meeting tomorrow - Delirium precautions  E. Coli bacteremia Patient with positive blood culture with e. Coli, final report pending. Likely from urinary source. Unfortunately, no urine culture obtained prior to starting abx. Started on CTX last night.  - Continue CTX, until susceptibilities return. - Final susceptibilities pending - Will consider switching to PO abx in 1-2 days.  Constipation: acute, stable Has not had BM since 11/27. On Miralax once daily. Patient complains of feeling bloated and gassy, would like assistance with BM. Daughter prefers to hold off on suppository and instead increase MiraLAX and add senna. Would prefer to try Glycerin suppository tomorrow if patient still has no bowel movement. - Increase MiraLAX to BID - Senna tablet  daily  Dyspnea on Exertion  Trace Pleural Effusions: chronic, stable Per nursing notes, patient with O2 sat drops to 80's while sleeping without aersol mask. Has been able to be weaned to 1L [place] within the last day. Patient does not complain of SOB this AM. Has not required PRN albuterol since yesterday AM. Saturating >92%. - Continue Lasix 40 mg daily  - Continue home Albuterol nebs/inhaler  - Supplemental O2 as needed to maintain goal of 88-92%  - PT/OT treat   Normocytic Anemia: chronic, stable Hgb 8.3 this morning. S/p 1U PRBC yesterday. Improved from 7.6 yesterday. Hgb 8.6 on admission. Given patient's hx of CAD, transfusion threshold <8.0. Iron studies suggest iron deficiency (ferritin 31, iron 30, saturation ratio 7). Daughter would like repeat CBC tomorrow to ensure Hgb is stable as patient is likely to d/c home with hospice in the next couple of days. - Continue ferrous sulfate daily - Recheck CBC tomorrow   Abdominal Soft Tissue Mass Found on CT abd/pelvis with oral contrast. Palliative consult yesterday with decision to pursue home hospice. Will not pursue additional work up at this time.   CKD Stage IV  Hx of Transitional Cell Carcinoma of Ureter s/p Nephrectomy: chronic, stable Renal function panel stable. Creatinine 1.75 -Avoid nephrotoxic meds  HFpEF  Cor Pulmonale: chronic, stable Echo showed EF 50-55%, LV low normal function with mild LV hypertrophy. Moderate tricuspid regurg and mitral regurg. Right ventricular systolic pressure 45. Overall, no significant change from prior echo.  -Continue Lasix 8m PO daily -Monitor volume status -Strict Is/Os, daily weights  Paroxysmal A-Fib: chronic, stable Patient currently in rate-controlled a-fib. Rate 70 this AM. -Continue home Eliquis and Metoprolol -Continue cardiac telemetry  Severe COPD: chronic, stable -Continue home  Trelegy and albuterol -Goal SpO2 88-92%  HLD: chronic, stable -Continue home Atorvastatin  13m daily  FEN/GI: Regular diet PPx: Eliquis (for A-fib)   Status is: Inpatient  Remains inpatient appropriate because:Unsafe d/c plan   Dispo: The patient is from: Home              Anticipated d/c is to: Home              Anticipated d/c date is: 1 day              Patient currently is medically stable to d/c. Pending family meeting and hospice set up   Subjective:  Patient is seen this AM with daughter at bedside. States that she is feeling much better, feels that the socialization with people has helped her. She denies feeling short of breath. States she has not had a bowel movement in several days. She feels bloated and gassy. States that her only request is that we help with this. She otherwise is looking forward to her birthday tomorrow.   Objective: Temp:  [97.4 F (36.3 C)-98.3 F (36.8 C)] 98.1 F (36.7 C) (12/01 0536) Pulse Rate:  [62-92] 62 (12/01 0536) Resp:  [18-21] 21 (12/01 0536) BP: (100-126)/(60-89) 124/86 (12/01 0536) SpO2:  [92 %-100 %] 100 % (12/01 0536) Weight:  [82.2 kg-84.6 kg] 82.2 kg (12/01 0536) Physical Exam: General: Pleasant, in no acute distress Cardiovascular: irregularly irregular rhythm, rate at 70 bpm by manual check Respiratory: CTAB, on 1L [place] Abdomen: soft, no rebound or guarding, non-tender except for mild tenderness to RUQ  Extremities: 2+ pitting edema right leg > left leg  Laboratory: Recent Labs  Lab 01/27/20 0750 01/28/20 0258 01/29/20 0335  WBC 5.0 5.8 5.3  HGB 7.7* 7.6* 8.3*  HCT 27.5* 27.0* 29.5*  PLT 191 178 166   Recent Labs  Lab 01/26/20 1343 01/27/20 0750 01/28/20 0258  NA 142 141 138  K 4.3 4.7 4.5  CL 107 107 104  CO2 _0 BUN 34* 31* 34*  CREATININE 1.81* 1.85* 1.89*  CALCIUM 8.5* 8.4* 8.3*  PROT  --  5.9*  --   BILITOT  --  0.9  --   ALKPHOS  --  59  --   ALT  --  10  --   AST  --  12*  --   GLUCOSE 129* 96 114*    Imaging/Diagnostic Tests: CT Head IMPRESSION: Generalized atrophy and  chronic microvascular ischemia without acute intracranial abnormality.   ESharion Settler DO 01/29/2020, 6:32 AM PGY-1, [facility] pager: [phone] text pages welcome

## 2020-01-29 NOTE — Progress Notes (Signed)
Occupational Therapy Treatment Patient Details Name: [name] MRN: [id number] DOB: [birth year] Today's Date: 01/29/2020    History of present illness 86 y.o. female presenting with SOB x1 month with O2 desaturation during exertion, LE edema, and AMS with concern for FTT. 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   OT comments  Patient continues to present with the deficits listed below.  HR up to 94 with mobility and O2 sats down to 83% on 2 L, not a great wave form for O2 reading, she was up to 94% once the probe was loosened and reattached to her finger.  She continues to need up to [facility] with mobility in the room, and continued assist for ADL.  OT will continue to follow in the acute setting, but the plan for SNF is appropriate.    Follow Up Recommendations  SNF;Supervision/Assistance - 24 hour    Equipment Recommendations  3 in 1 bedside commode;Wheelchair (measurements OT);Wheelchair cushion (measurements OT)    Recommendations for Other Services      Precautions / Restrictions Precautions Precautions: Fall Precaution Comments: High fall risk, Monitor HR and SpO2 Restrictions Weight Bearing Restrictions: No       Mobility Bed Mobility   Bed Mobility: Supine to Sit     Supine to sit: Min guard;HOB elevated     General bed mobility comments:  (sitting EOB on arrival)  Transfers Overall transfer level: Needs assistance Equipment used: Rolling walker (2 wheeled) Transfers: Sit to/from [facility] Sit to Stand: Min guard Stand pivot transfers: Min guard       General transfer comment: min guard for safety and management of lines. Stand pivot bed>BSC with RW    Balance Overall balance assessment: Needs assistance Sitting-balance support: Bilateral upper extremity supported;Feet supported Sitting balance-Leahy Scale: Fair     Standing balance support:  Bilateral upper extremity supported Standing balance-Leahy Scale: Poor Standing balance comment: Heavy reliance on BUE support on RW.                            ADL either performed or assessed with clinical judgement   ADL Overall ADL's : Needs assistance/impaired     Grooming: Wash/dry face;Set up;Wash/dry hands;Sitting                   Toilet Transfer: Min guard;RW;Ambulation   Toileting- Clothing Manipulation and Hygiene: Supervision/safety;Sit to/from stand       Functional mobility during ADLs: Min guard;Rolling walker                         Cognition Arousal/Alertness: Awake/alert Behavior During Therapy: WFL for tasks assessed/performed Overall Cognitive Status: History of cognitive impairments - at baseline                                 General Comments: Dementia at baseline                    General Comments Patient on 2L O2 [place] with spO2 maintaining between 88-93%, lowest drop 85% with education on pursed lip breathing, patient able to recover in <30 seconds    Pertinent Vitals/ Pain       Pain Assessment: No/denies pain  Frequency  Min 2X/week        Progress Toward Goals  OT Goals(current goals can now be found in the care plan section)  Progress towards OT goals: Progressing toward goals  Acute Rehab OT Goals Patient Stated Goal: Hoping to breath a little better so I can walk more OT Goal Formulation: With patient/family Time For Goal Achievement: 02/10/20 Potential to Achieve Goals: Good  Plan Discharge plan remains appropriate    Co-evaluation                 AM-PAC OT "6 Clicks" Daily Activity     Outcome Measure   Help from another person eating meals?: None Help from another person taking care of personal grooming?: A Little Help from another person toileting, which includes using toliet, bedpan,  or urinal?: A Little Help from another person bathing (including washing, rinsing, drying)?: A Lot Help from another person to put on and taking off regular upper body clothing?: A Little Help from another person to put on and taking off regular lower body clothing?: A Lot 6 Click Score: 17    End of Session Equipment Utilized During Treatment: Rolling walker  OT Visit Diagnosis: Unsteadiness on feet (R26.81);Repeated falls (R29.6);Muscle weakness (generalized) (M62.81);Adult, failure to thrive (R62.7)   Activity Tolerance Patient tolerated treatment well   Patient Left in chair;with call bell/phone within reach;with chair alarm set   Nurse Communication Other (comment) (patient up in the chair)        Time: 7322-0254 OT Time Calculation (min): 22 min  Charges: OT General Charges $OT Visit: 1 Visit OT Treatments $Self Care/Home Management : 8-22 mins  01/29/2020  Rich, OTR/L  Acute Rehabilitation Services  Office:  [place] 01/29/2020, 4:01 PM

## 2020-01-29 NOTE — Progress Notes (Signed)
PHARMACY - PHYSICIAN COMMUNICATION CRITICAL VALUE ALERT - BLOOD CULTURE IDENTIFICATION (BCID)  [name] is an 86 y.o. female who presented to [facility] on 01/26/2020 with a chief complaint of dyspnea on exertion and hypoxia.   Assessment:  Bcx 1/4 anaerobic bottle with Ecoli, no resistance. WBC wnl. Afebrile.  Name of physician (or Provider) Contacted: Zettie Cooley   Current antibiotics: none  Changes to prescribed antibiotics recommended: Suggest starting ceftriaxone IV. Recommendations accepted by provider  Results for orders placed or performed during the hospital encounter of 01/26/20  Blood Culture ID Panel (Reflexed) (Collected: 01/26/2020  1:24 PM)  Result Value Ref Range   Enterococcus faecalis NOT DETECTED NOT DETECTED   Enterococcus Faecium NOT DETECTED NOT DETECTED   Listeria monocytogenes NOT DETECTED NOT DETECTED   Staphylococcus species NOT DETECTED NOT DETECTED   Staphylococcus aureus (BCID) NOT DETECTED NOT DETECTED   Staphylococcus epidermidis NOT DETECTED NOT DETECTED   Staphylococcus lugdunensis NOT DETECTED NOT DETECTED   Streptococcus species NOT DETECTED NOT DETECTED   Streptococcus agalactiae NOT DETECTED NOT DETECTED   Streptococcus pneumoniae NOT DETECTED NOT DETECTED   Streptococcus pyogenes NOT DETECTED NOT DETECTED   A.calcoaceticus-baumannii NOT DETECTED NOT DETECTED   Bacteroides fragilis NOT DETECTED NOT DETECTED   Enterobacterales DETECTED (A) NOT DETECTED   Enterobacter cloacae complex NOT DETECTED NOT DETECTED   Escherichia coli DETECTED (A) NOT DETECTED   Klebsiella aerogenes NOT DETECTED NOT DETECTED   Klebsiella oxytoca NOT DETECTED NOT DETECTED   Klebsiella pneumoniae NOT DETECTED NOT DETECTED   Proteus species NOT DETECTED NOT DETECTED   Salmonella species NOT DETECTED NOT DETECTED   Serratia marcescens NOT DETECTED NOT DETECTED   Haemophilus influenzae NOT DETECTED NOT DETECTED   Neisseria meningitidis NOT DETECTED NOT DETECTED    Pseudomonas aeruginosa NOT DETECTED NOT DETECTED   Stenotrophomonas maltophilia NOT DETECTED NOT DETECTED   Candida albicans NOT DETECTED NOT DETECTED   Candida auris NOT DETECTED NOT DETECTED   Candida glabrata NOT DETECTED NOT DETECTED   Candida krusei NOT DETECTED NOT DETECTED   Candida parapsilosis NOT DETECTED NOT DETECTED   Candida tropicalis NOT DETECTED NOT DETECTED   Cryptococcus neoformans/gattii NOT DETECTED NOT DETECTED   CTX-M ESBL NOT DETECTED NOT DETECTED   Carbapenem resistance IMP NOT DETECTED NOT DETECTED   Carbapenem resistance KPC NOT DETECTED NOT DETECTED   Carbapenem resistance NDM NOT DETECTED NOT DETECTED   Carbapenem resist OXA 48 LIKE NOT DETECTED NOT DETECTED   Carbapenem resistance VIM NOT DETECTED NOT DETECTED    Benetta Spar, PharmD, BCPS, BCCP Clinical Pharmacist  Please check AMION for all [place] phone numbers After 10:00 PM, call [place] [phone]

## 2020-01-29 NOTE — Plan of Care (Signed)

## 2020-01-29 NOTE — Telephone Encounter (Signed)
Verbal ok given to [place].  Will update provider.  Litzi Binning,CMA

## 2020-01-29 NOTE — Progress Notes (Signed)
Patient resting in bed near end of shift. Patient A&Ox4 but at times can be forgetful. Patients saturations at 100% on simple face mask 6L. When mask is off patients stats drop to 80's while sleeping. Patients U/O during shift 800 ml, external cathter remains in place. No BM noted patient needing strong Bowel regimen as patient has not had a BM since 01/25/20. CT of head completed during shift, with slight abnormalities, read results. Patient continues at Level 3 intensity score.  Patient's safety measures remain in place, floor mats a bed side due to high fall risk. Will continue to monitor and SBARR to day shift nurse at change of shift.

## 2020-01-29 NOTE — Progress Notes (Addendum)
[facility])  Referral received for hospice services at home once discharged.  Spoke with dtr and POA, confirmed interest in hospice.  [name] states that they are interested in hospice but have another meeting with PMT on 12/2. After this meeting, she requests that I reach back out to discuss d/c date, DME.  Plan to follow up with family Thursday afternoon.    Per [name], she felt they may d/c home on Friday or Saturday.  Venia Carbon RN, BSN, [place] [facility] Liaison

## 2020-01-29 NOTE — Progress Notes (Signed)
Physical Therapy Treatment Patient Details Name: [name] MRN: [id number] DOB: 1923/07/11 Today's Date: 01/29/2020    History of Present Illness 86 y.o. female presenting with SOB x1 month with O2 desaturation during exertion, LE edema, and AMS with concern for FTT. 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    PT Comments    Patient sitting EOB on arrival. Patient agreeable to PT with daughter present. Patient performed sit to stand with min guard and RW. Patient transferred stand pivot bed>BSC with RW and min guard due to request to use the bathroom. Patient able to assist with pericare, however needed to remain on [facility], notified NT. NT arrived and took over care of patient. Patient on 2L O2 [place] throughout session with spO2 maintaining 88-93% with lowest drop to 85%, educated in pursed lip breathing with patient able to recover in <30 seconds. Patient continues to be limited by decreased activity tolerance, generalized weakness, impaired balance and impaired functional mobility. Recommend HHPT following discharge to maximize functional mobility.   Follow Up Recommendations  Home health PT;Supervision for mobility/OOB     Equipment Recommendations  3in1 (PT);Wheelchair (measurements PT);Wheelchair cushion (measurements PT)    Recommendations for Other Services       Precautions / Restrictions Precautions Precautions: Fall Precaution Comments: High fall risk, Monitor HR and SpO2 Restrictions Weight Bearing Restrictions: No    Mobility  Bed Mobility               General bed mobility comments:  (sitting EOB on arrival)  Transfers Overall transfer level: Needs assistance Equipment used: Rolling [place] (2 wheeled) Transfers: Sit to/from [facility] Sit to Stand: Min guard Stand pivot transfers: Min guard       General transfer comment: min guard for safety and  management of lines. Stand pivot bed>BSC with RW  Ambulation/Gait                 Stairs             Wheelchair Mobility    Modified Rankin (Stroke Patients Only)       Balance Overall balance assessment: Needs assistance Sitting-balance support: Bilateral upper extremity supported;Feet supported Sitting balance-Leahy Scale: Fair     Standing balance support: Bilateral upper extremity supported Standing balance-Leahy Scale: Poor Standing balance comment: Heavy reliance on BUE support on RW.                             Cognition Arousal/Alertness: Awake/alert Behavior During Therapy: WFL for tasks assessed/performed Overall Cognitive Status: History of cognitive impairments - at baseline                                 General Comments: Dementia at baseline      Exercises      General Comments General comments (skin integrity, edema, etc.): Patient on 2L O2 [place] with spO2 maintaining between 88-93%, lowest drop 85% with education on pursed lip breathing, patient able to recover in <30 seconds      Pertinent Vitals/Pain Pain Assessment: No/denies pain    Home Living                      Prior Function            PT Goals (current goals  can now be found in the care plan section) Acute Rehab PT Goals Patient Stated Goal: to go home PT Goal Formulation: With patient/family Time For Goal Achievement: 02/10/20 Potential to Achieve Goals: Fair Progress towards PT goals: Progressing toward goals    Frequency    Min 2X/week      PT Plan Current plan remains appropriate    Co-evaluation              AM-PAC PT "6 Clicks" Mobility   Outcome Measure  Help needed turning from your back to your side while in a flat bed without using bedrails?: A Little Help needed moving from lying on your back to sitting on the side of a flat bed without using bedrails?: A Little Help needed moving to and from a bed to a  chair (including a wheelchair)?: A Little Help needed standing up from a chair using your arms (e.g., wheelchair or bedside chair)?: A Little Help needed to walk in hospital room?: A Little Help needed climbing 3-5 steps with a railing? : Total 6 Click Score: 16    End of Session Equipment Utilized During Treatment: Gait belt;Oxygen Activity Tolerance: Patient limited by fatigue Patient left: with nursing/sitter in room (on [facility]) Nurse Communication: Mobility status PT Visit Diagnosis: Unsteadiness on feet (R26.81);Muscle weakness (generalized) (M62.81);Difficulty in walking, not elsewhere classified (R26.2)     Time: 1346-1430 PT Time Calculation (min) (ACUTE ONLY): 44 min  Charges:  $Therapeutic Activity: 38-52 mins                     Perrin Maltese, PT, DPT Acute Rehabilitation Services Pager [phone] Office [phone]    [name] 01/29/2020, 2:36 PM

## 2020-01-29 NOTE — TOC Progression Note (Signed)
Transition of Care [facility]) - Progression Note    Patient Details  Name: [name] MRN: [id number] Date of Birth: Apr 10, 1923  Transition of Care [facility]) CM/SW [place], [place] Phone Number: 01/29/2020, 11:51 AM  Clinical Narrative:     CSW received consult for home hospice for patient. CSW called Chriyslyn with [facility] and made referral for home hospice for patient.   CSW will continue to follow.    Expected Discharge Plan: [place] Barriers to Discharge: Continued Medical Work up  Expected Discharge Plan and Services Expected Discharge Plan: [place] arrangements for the past 2 months: Single Family Home                                       Social Determinants of Health (SDOH) Interventions    Readmission Risk Interventions No flowsheet data found.

## 2020-01-29 NOTE — Telephone Encounter (Signed)
Kristie from [facility] is calling and would like to know if Dr. McDiarmid would be willing to sign off on orders for patient to be discharged from hospital to her home with hospice care.

## 2020-01-30 DIAGNOSIS — L97929 Non-pressure chronic ulcer of unspecified part of left lower leg with unspecified severity: Secondary | ICD-10-CM

## 2020-01-30 DIAGNOSIS — D500 Iron deficiency anemia secondary to blood loss (chronic): Secondary | ICD-10-CM

## 2020-01-30 DIAGNOSIS — J449 Chronic obstructive pulmonary disease, unspecified: Secondary | ICD-10-CM

## 2020-01-30 DIAGNOSIS — I83029 Varicose veins of left lower extremity with ulcer of unspecified site: Secondary | ICD-10-CM

## 2020-01-30 DIAGNOSIS — Z66 Do not resuscitate: Secondary | ICD-10-CM

## 2020-01-30 DIAGNOSIS — I83019 Varicose veins of right lower extremity with ulcer of unspecified site: Secondary | ICD-10-CM

## 2020-01-30 DIAGNOSIS — Z789 Other specified health status: Secondary | ICD-10-CM

## 2020-01-30 DIAGNOSIS — L97919 Non-pressure chronic ulcer of unspecified part of right lower leg with unspecified severity: Secondary | ICD-10-CM

## 2020-01-30 DIAGNOSIS — R7881 Bacteremia: Secondary | ICD-10-CM | POA: Diagnosis not present

## 2020-01-30 DIAGNOSIS — Z7189 Other specified counseling: Secondary | ICD-10-CM | POA: Diagnosis not present

## 2020-01-30 DIAGNOSIS — I714 Abdominal aortic aneurysm, without rupture: Secondary | ICD-10-CM

## 2020-01-30 LAB — CBC
HCT: 30.6 % — ABNORMAL LOW (ref 36.0–46.0)
Hemoglobin: 8.8 g/dL — ABNORMAL LOW (ref 12.0–15.0)
MCH: 26.5 pg (ref 26.0–34.0)
MCHC: 28.8 g/dL — ABNORMAL LOW (ref 30.0–36.0)
MCV: 92.2 fL (ref 80.0–100.0)
Platelets: 171 10*3/uL (ref 150–400)
RBC: 3.32 MIL/uL — ABNORMAL LOW (ref 3.87–5.11)
RDW: 15.9 % — ABNORMAL HIGH (ref 11.5–15.5)
WBC: 5.3 10*3/uL (ref 4.0–10.5)
nRBC: 0 % (ref 0.0–0.2)

## 2020-01-30 LAB — CULTURE, BLOOD (ROUTINE X 2): Special Requests: ADEQUATE

## 2020-01-30 MED ORDER — METOPROLOL SUCCINATE ER 25 MG PO TB24
25.0000 mg | ORAL_TABLET | Freq: Every day | ORAL | Status: DC
  Administered 2020-01-31 – 2020-02-01 (×2): 25 mg via ORAL
  Filled 2020-01-30 (×2): qty 1

## 2020-01-30 NOTE — Plan of Care (Signed)
  Problem: Clinical Measurements: Goal: Respiratory complications will improve Outcome: Progressing   Problem: Activity: Goal: Risk for activity intolerance will decrease Outcome: Progressing   Problem: Coping: Goal: Level of anxiety will decrease Outcome: Progressing   

## 2020-01-30 NOTE — Progress Notes (Signed)
   01/29/20 2059  Assess: MEWS Score  Temp (!) 97.3 F (36.3 C)  BP (!) 78/53  Pulse Rate 71  ECG Heart Rate 72  Resp 19  SpO2 100 %  O2 Device Nasal Cannula  Assess: MEWS Score  MEWS Temp 0  MEWS Systolic 2  MEWS Pulse 0  MEWS RR 0  MEWS LOC 0  MEWS Score 2  MEWS Score Color Yellow  Assess: if the MEWS score is Yellow or Red  Were vital signs taken at a resting state? Yes  Focused Assessment No change from prior assessment  Early Detection of Sepsis Score *See Row Information* Low  MEWS guidelines implemented *See Row Information* Yes  Take Vital Signs  Increase Vital Sign Frequency  Yellow: Q 2hr X 2 then Q 4hr X 2, if remains yellow, continue Q 4hrs  Escalate  MEWS: Escalate Yellow: discuss with charge nurse/RN and consider discussing with provider and RRT  Notify: Charge Nurse/RN  Name of Charge Nurse/RN Notified Shanell RN  Date Charge Nurse/RN Notified 01/29/20  Time Charge Nurse/RN Notified 2312  Document  Patient Outcome Other (Comment)

## 2020-01-30 NOTE — Progress Notes (Signed)
Daily Progress Note   Patient Name: [name]       Date: 01/30/2020 DOB: 27-Jan-1924  Age: 86 y.o. MRN#: [id number] Attending Physician: Martyn Malay, MD Primary Care Physician: McDiarmid, Blane Ohara, MD Admit Date: 01/26/2020  Reason for Consultation/Follow-up: Establishing goals of care  Subjective: Chart review performed. Received report from primary RN - no acute concerns. RN states patient sleeps most of the time.  Family meeting was set up per [name]/HCPOA's request - she wanted support in speaking with her siblings to discuss her decision to discharge the patient home with hospice.  Went to visit patient at bedside -  Family was present. Patient was lying in bed awake, alert, and able to participate in minimal conversation. No signs or non-verbal gestures of pain or discomfort noted. No respiratory distress, increased work of breathing, or secretions noted. Noted patient is now on 3L O2 [place].  Met with patient's 4 daughter's, 3 in person and 1 via speakerphone, to diagnosis, prognosis, GOC, EOL wishes, disposition, and options in conference room.  I introduced Palliative Medicine as specialized medical care for people living with serious illness. It focuses on providing relief from the symptoms and stress of a serious illness. The goal is to improve quality of life for both the patient and the family.  We discussed patient's current illness and what it means in the larger context of patient's on-going co-morbidities. Natural disease trajectory and expectations at EOL were discussed. I attempted to elicit values and goals of care important to the patient. The difference between aggressive medical intervention and comfort care was considered in light of the patient's goals of care. After  detailed discussion - all 4 daughters were in acceptance with [name]'s decision to pursue home hospice for the patient. Emotional support and therapeutic listening was provided throughout visit. Discussed hospice services in detail - answered all questions.   After family meeting, discussed code status with [name]. Reviewed hospice philosophy and encouraged her to consider full DNR/DNI status understanding evidenced based poor outcomes in similar hospitalized patient, as the cause of arrest is likely associated with advanced chronic/terminal illness/age rather than an easily reversible acute cardio-pulmonary event. [name] was agreeable to DNR/DNI with understanding that the patient would not receive CPR, defibrillation, ACLS medications, or intubation.  Visit also consisted of discussions dealing with  the complex and emotionally intense issues of symptom management and palliative care/hospice in the setting of EOL. Palliative care team will continue to support patient, patient's family, and medical team.  Discussed with patient/family the importance of continued conversation with each other and the medical providers regarding overall plan of care and treatment options, ensuring decisions are within the context of the patient's values and GOCs.    Questions and concerns were addressed. The patient/family was encouraged to call with questions and/or concerns. PMT card was provided.  Provided copies of "Hard Choices for Loving People."  Length of Stay: 3  Current Medications: Scheduled Meds:  . apixaban  2.5 mg Oral BID  . atorvastatin  20 mg Oral Daily  . ferrous sulfate  325 mg Oral Q breakfast  . fluticasone furoate-vilanterol  1 puff Inhalation Daily   And  . umeclidinium bromide  1 puff Inhalation Daily  . furosemide  40 mg Oral Daily  . mouth rinse  15 mL Mouth Rinse BID  . [START ON 01/31/2020] metoprolol succinate  25 mg Oral Daily  . pantoprazole  40 mg Oral Daily  . polyethylene  glycol  17 g Oral BID  . senna  1 tablet Oral Daily    Continuous Infusions: . cefTRIAXone (ROCEPHIN)  IV 2 g (01/30/20 1632)    PRN Meds: albuterol, fluticasone  Physical Exam Vitals and nursing note reviewed.  Constitutional:      General: She is not in acute distress. Pulmonary:     Effort: No respiratory distress.  Skin:    General: Skin is warm and dry.  Neurological:     Mental Status: She is lethargic.     Motor: Weakness present.     Comments: forgetful  Psychiatric:        Behavior: Behavior is cooperative.        Cognition and Memory: Cognition is impaired. Memory is impaired.             Vital Signs: BP 120/78 (BP Location: Left Arm)   Pulse 84   Temp 98.2 F (36.8 C) (Oral)   Resp 18   Ht 5' 5"  (1.651 m)   Wt 85.1 kg   SpO2 97%   BMI 31.22 kg/m  SpO2: SpO2: 97 % O2 Device: O2 Device: Nasal Cannula O2 Flow Rate: O2 Flow Rate (L/min): 3 L/min  Intake/output summary: No intake or output data in the 24 hours ending 01/30/20 2337 LBM: Last BM Date: 01/29/20 Baseline Weight: Weight: 81.2 kg Most recent weight: Weight: 85.1 kg       Palliative Assessment/Data: PPS 30-40%      Patient Active Problem List   Diagnosis Date Noted  . Delirium due to another medical condition, acute, hypoactive, due to gram negative bacteremia 01/29/2020  . E coli bacteremia 01/29/2020  . Balance problem 01/28/2020  . AAA (abdominal aortic aneurysm) without rupture ([place]) 01/28/2020  . Aortic calcification ([place]) 01/28/2020  . Obstipation 01/28/2020  . Pleural effusion, bilateral   . Peripheral edema   . Palliative care by specialist   . Goals of care, counseling/discussion   . Encounter for hospice care discussion   . Altered mental status   . SOB (shortness of breath) 01/26/2020  . Frailty 01/13/2020  . Deficits in activities of daily living 01/13/2020  . Cor pulmonale, chronic ([place]) 08/30/2019  . Ambulatory dysfunction 08/19/2019  . Venous stasis ulcers of  both lower extremities ([place]) 07/05/2019  . Cystitis 03/19/2019  . History of recent fall 03/19/2019  .  Atrial fibrillation ([place]), permanent   . Bilateral leg edema 01/10/2017  . Chronic diastolic CHF (congestive heart failure) ([place]) 11/08/2015  . On amiodarone therapy 09/14/2015  . Pure hypercholesterolemia 07/31/2014  . CAD S/P RCA BMS, residual CFX disease 06/08/2014  . Healthcare maintenance 04/08/2014  . Pacemaker 04/02/2013  . Dementia ([place]), possible Alzhemier's type 10/26/2012  . Chronic anticoagulation 10/01/2012  . Urge incontinence 12/13/2011  . Vitamin D deficiency 11/02/2010  . Macular degeneration, bilateral 10/04/2010  . History of Iron deficiency anemia due to chronic GI blood loss 08/05/2010  . Solitary kidney, acquired 05/17/2010  . Spondylolisthesis of lumbar region   . Vitamin B12 deficiency 10/07/2009  . Anemia 09/03/2009  . Chronic kidney disease (CKD), stage IV (severe) ([place]) 09/02/2009  . MYOCARDIAL INFARCTION, HX OF 09/25/2008  . VENTRICULAR HYPERTROPHY, LEFT 08/28/2008  . At risk for diabetes mellitus 09/11/2007  . Tachycardia-bradycardia syndrome ([place]) 04/27/2006  . Allergic rhinitis, seasonal 04/27/2006  . COPD, severe ([place]) 04/27/2006  . GASTROESOPHAGEAL REFLUX, NO ESOPHAGITIS 04/27/2006  . Osteoarthrosis involving lower leg 04/27/2006  . LUMBAR SPINAL STENOSIS 04/27/2006    Palliative Care Assessment & Plan   Patient Profile: 86 y.o. 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  Patient and family are not pursing work up for abdominal mass, knowing that regardless of the results they would not pursue treatment/treatment would not be recommended.   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 If there are any imminent needs please call the service directly   Goals of Care and Additional Recommendations:  Limitations on Scope of Treatment: Full Scope Treatment and No Surgical Procedures  Code Status:    Code Status Orders  (From admission, onward)         Start     Ordered   01/30/20 2337  Do not attempt resuscitation (DNR)  Continuous       Question Answer Comment  In the event of cardiac or respiratory ARREST Do not call a "code blue"   In the event of cardiac or respiratory ARREST Do not perform Intubation, CPR, defibrillation or ACLS   In the event of cardiac or respiratory ARREST Use medication by any route, position, wound care, and other measures to relive pain and suffering. May use oxygen, suction and manual treatment of airway obstruction as needed for comfort.      01/30/20 2336        Code Status History    Date Active Date Inactive Code Status Order ID Comments User Context   01/27/2020 1809 01/30/2020 2336 Partial Code [id number]  Collene Gobble, MD Inpatient   01/26/2020 1910 01/27/2020 1807 Full Code [id number]  Alcus Dad, MD Inpatient   01/11/2018 0000 01/15/2018 1948 Full Code [id number]  Lovenia Kim, MD Inpatient   10/16/2017 1930 10/21/2017 1643 Full Code [id number]  [name],  Lynnell Dike, MD ED   08/30/2017 0542 08/30/2017 1920 Full Code [id number]  Kathrene Alu, MD ED   11/08/2015 2327 11/13/2015 1814 Full Code [id number]  Ivor Costa, MD ED   06/14/2015 0418 06/15/2015 2025 Full Code [id number]  Elberta Leatherwood, MD Inpatient   05/21/2015 0337 05/21/2015 1924 Full Code [id number]  McKeag, Marylynn Pearson, MD ED   02/25/2015 0513 03/06/2015 1731 Full Code [id number]  Veatrice Bourbon, MD ED   05/31/2014 0150 06/01/2014 1840 Full Code [id number]  Skeet Latch, MD ED   03/14/2013 0012 03/14/2013 1719 DNR [id number]  Olam Idler, MD ED   01/08/2012 1929 01/10/2012 1824 DNR [id number]  Parthenia Ames, RN Inpatient   Advance Care Planning Activity    Advance Directive Documentation     Most Recent Value  Type of Advance Directive Healthcare Power of Attorney, Living will  Pre-existing out of facility DNR order (yellow form or pink MOST form) --  "MOST" Form in Place? --       Prognosis:   < 6 months  Discharge Planning:  Home with Hospice  Care plan was discussed with patient's 4 daughters, Dr. Nita Sells, [facility], [facility] hospice liaison  Thank you for allowing the Palliative Medicine Team to assist in the care of this patient.   Total Time 70 minutes Prolonged Time Billed  yes       Greater than 50%  of this time was spent counseling and coordinating care related to the above assessment and plan.  Lin Landsman, NP  Please contact Palliative Medicine Team phone at [phone] for questions and concerns.

## 2020-01-30 NOTE — Progress Notes (Signed)
Manufacturing engineer [facility])  Discussed with daughter [name] DME that needed to be ordered.  ACC will meet [name] in the room in the morning to review medications.  ACC will order DME Friday am, I do not anticipate that it will be delivered until Friday early evening. Saturday am d/c would be preferable as long as DME is set up.  Discussed code status, for now family wants her to remain a partial code.  We will continue with [place] conversations once she d/c home.  Thank you, Venia Carbon RN, BSN, [facility] Liaison

## 2020-01-30 NOTE — Progress Notes (Signed)
Family Medicine Teaching Service Daily Progress Note Intern Pager: [phone]  Patient name: [name] Medical record number: [id number] Date of birth: 24-May-1923 Age: 86 y.o. Gender: female  Primary Care Provider: McDiarmid, Blane Ohara, MD Consultants: Palliative Care Code Status: Partial   Pt Overview and Major Events to Date:  11/28: Admitted 11/30: Palliative consult   Assessment and Plan: [name] a 86 y.o.femalewho presentedwith 1 month of worsening shortness of breathand altered mental status. PMH is significant forCAD, a-fib, COPD, HFpEF, HTN, CKD Stage IV, Transitional cell carcinoma of ureters/p nephrectomy,anddementia.  Declining Mental State  Hx of Dementia: chronic, stable Head CT with generalized atrophy and chronic microvascular ischemia, no acute findings. Patient seems more alert and also reports feeling less confused. Palliative care to have family meeting today with the four daughters. Plan to continue working towards home hospice. - Palliative team on board  - F/u family meeting today - Delirium precautions  E. Coli bacteremia Patient with positive blood culture with e. Coli. Likely from urinary source. Unfortunately, no urine culture obtained prior to starting abx. On CTX. Sensitivities have returned and resistant to Ampicillin and Ampicillin/Sulbactam.  - Continue CTX  - Will consider switching to PO abx tomorrow  Constipation: acute, resolved  MiraLAX increased to BID yesterday and senna added. Patient reports having a bowel movement yesterday.  - Continue MiraLAX and senna daily  Dyspnea on Exertion  Trace Pleural Effusions: chronic, stable Patient complains of SOB with exertion. On 2L [place] with appropriate saturations. Has not required PRN albuterol since 11/30.  - Continue Lasix 40 mg daily  - Continue home Albuterol nebs/inhaler  - Supplemental O2 as needed to maintain goal of 88-92%  - PT/OT treat   Normocytic Anemia: chronic,  stable Hgb 8.8 this morning, improved from 8.3 yesterday. Hgb 8.6 on admission. Given patient's hx of CAD, transfusion threshold <8.0. Iron studies suggest iron deficiency (ferritin 31, iron 30, saturation ratio 7). - Continue ferrous sulfate daily  Abdominal Soft Tissue Mass Found on CT abd/pelvis with oral contrast. Palliative consult with decision to pursue home hospice. Will not pursue additional work up at this time.   CKD Stage IV  Hx of Transitional Cell Carcinoma of Ureter s/p Nephrectomy: chronic, stable Creatinine 1.75 -Avoid nephrotoxic meds  HFpEF  Cor Pulmonale: chronic, stable EchoshowedEF 50-55%, LV low normal function with mild LV hypertrophy. Moderate tricuspid regurg and mitral regurg. Right ventricular systolic pressure 45. Overall, no significant change from prior echo. -Continue Lasix40mg  PO daily -Monitor volume status -Strict Is/Os, daily weights  Paroxysmal A-Fib: chronic, stable Rate-controlled. Decrease Metoprolol to 25mg  starting tomorrow. -Continue home Eliquis  - 25 mg Metoprolol -Continue cardiac telemetry  SevereCOPD: chronic, stable -Continue home Trelegy and albuterol -Goal SpO2 88-92%  HLD: chronic, stable -Continue home Atorvastatin 20mg  daily  FEN/GI: Regular diet PPx: Eliquis    Status is: Inpatient  Remains inpatient appropriate because:Unsafe d/c plan   Dispo: The patient is from: Home              Anticipated d/c is to: Home              Anticipated d/c date is: 1 day              Patient currently is medically stable to d/c.    Subjective:  Patient is doing well, has no complaints. Is excited to celebrate her birthday today.   Objective: Temp:  [97.3 F (36.3 C)-98.3 F (36.8 C)] 97.7 F (36.5 C) (12/02 0540)  Pulse Rate:  [49-73] 61 (12/02 0540) Resp:  [16-19] 16 (12/02 0540) BP: (78-140)/(53-95) 114/56 (12/02 0540) SpO2:  [95 %-100 %] 97 % (12/02 0540) Weight:  [85.1 kg] 85.1 kg (12/02 0540) Physical  Exam: General: Pleasant, cooperative, conversational, in NAD Cardiovascular: Irregularly irregular, normal rate Respiratory: Crackles in left lower base, decreased air movement, on 2L [place] Abdomen: soft, non-tender in all quadrants, normoactive bowel sounds  Extremities: 2+ pitting edema RLE from ankle to distal 1/3 of leg.  Laboratory: Recent Labs  Lab 01/28/20 0258 01/29/20 0335 01/30/20 0112  WBC 5.8 5.3 5.3  HGB 7.6* 8.3* 8.8*  HCT 27.0* 29.5* 30.6*  PLT 178 166 171   Recent Labs  Lab 01/27/20 0750 01/28/20 0258 01/29/20 0734  NA 141 138 142  K 4.7 4.5 4.8  CL 107 104 104  CO2 23 27 27   BUN 31* 34* 35*  CREATININE 1.85* 1.89* 1.75*  CALCIUM 8.4* 8.3* 8.8*  PROT 5.9*  --   --   BILITOT 0.9  --   --   ALKPHOS 59  --   --   ALT 10  --   --   AST 12*  --   --   GLUCOSE 96 114* 92    Imaging/Diagnostic Tests: None new  Sharion Settler, DO 01/30/2020, 6:57 AM PGY-1, [place] Intern pager: [phone], text pages welcome

## 2020-01-31 DIAGNOSIS — R7881 Bacteremia: Secondary | ICD-10-CM | POA: Diagnosis not present

## 2020-01-31 DIAGNOSIS — I2781 Cor pulmonale (chronic): Secondary | ICD-10-CM

## 2020-01-31 DIAGNOSIS — B9620 Unspecified Escherichia coli [E. coli] as the cause of diseases classified elsewhere: Secondary | ICD-10-CM | POA: Diagnosis not present

## 2020-01-31 LAB — CULTURE, BLOOD (ROUTINE X 2)
Culture: NO GROWTH
Special Requests: ADEQUATE

## 2020-01-31 MED ORDER — CEFDINIR 300 MG PO CAPS
300.0000 mg | ORAL_CAPSULE | Freq: Every day | ORAL | Status: DC
  Administered 2020-01-31 – 2020-02-01 (×2): 300 mg via ORAL
  Filled 2020-01-31 (×2): qty 1

## 2020-01-31 MED ORDER — BISACODYL 10 MG RE SUPP
10.0000 mg | Freq: Every day | RECTAL | Status: DC | PRN
  Administered 2020-02-01: 10 mg via RECTAL
  Filled 2020-01-31: qty 1

## 2020-01-31 NOTE — Progress Notes (Signed)
Daily Progress Note   Patient Name: [name]       Date: 01/31/2020 DOB: [birth year]  Age: 86 y.o. MRN#: [id number] Attending Physician: Martyn Malay, MD Primary Care Physician: McDiarmid, Blane Ohara, MD Admit Date: 01/26/2020  Reason for Consultation/Follow-up: Disposition, Establishing goals of care and Terminal Care  Subjective: Chart review performed.   Went to visit patient at bedside - daughter/[name] was present. Patient was lying in bed asleep - she did intermittently awake and could participate in basic conversation. No signs or non-verbal gestures of pain or discomfort noted. No respiratory distress, increased work of breathing, or secretions noted. Patient denied pain.  [name] states that she has been in contact with [facility] hospice liaison and things were getting set up for the patient's discharge tomorrow. She expressed appreciation for all their help and support.   Continued code status discussion with [name], as there was a discrepancy between our conversation yesterday and what she talked about with [facility] liaison. [name] stated that, at this time, she did want the patient to be partial code - only ok with Bipap and cardioversion. [name] states that when the patient is closer to EOL, she would be ok with full DNR/DNI, but right now wants partial code. [name] wanted a way for partial code to be respected/known while the patient was at home. Introduced, reviewed, and completed MOST form as outlined below under Recommendation section - completed by [name]/HCPOA.  All questions and concerns addressed. Encouraged to call with questions and/or concerns. PMT card previously provided.   Length of Stay: 4  Current Medications: Scheduled Meds:  . apixaban  2.5 mg Oral BID  . atorvastatin   20 mg Oral Daily  . cefdinir  300 mg Oral Daily  . ferrous sulfate  325 mg Oral Q breakfast  . fluticasone furoate-vilanterol  1 puff Inhalation Daily   And  . umeclidinium bromide  1 puff Inhalation Daily  . furosemide  40 mg Oral Daily  . mouth rinse  15 mL Mouth Rinse BID  . metoprolol succinate  25 mg Oral Daily  . pantoprazole  40 mg Oral Daily  . polyethylene glycol  17 g Oral BID  . senna  1 tablet Oral Daily    Continuous Infusions:   PRN Meds: albuterol, bisacodyl, fluticasone  Physical Exam Vitals and  nursing note reviewed.  Constitutional:      General: She is not in acute distress.    Comments: Frail appearing  Pulmonary:     Effort: No respiratory distress.  Skin:    General: Skin is warm and dry.  Neurological:     Mental Status: She is alert. She is disoriented.     Motor: Weakness present.  Psychiatric:        Behavior: Behavior is cooperative.        Cognition and Memory: Cognition is impaired. Memory is impaired.             Vital Signs: BP 118/60 (BP Location: Left Arm)   Pulse 77   Temp 97.9 F (36.6 C) (Oral)   Resp (!) 25   Ht 5\' 5"  (1.651 m)   Wt 83.3 kg   SpO2 97%   BMI 30.56 kg/m  SpO2: SpO2: 97 % O2 Device: O2 Device: Nasal Cannula O2 Flow Rate: O2 Flow Rate (L/min): 2 L/min  Intake/output summary:   Intake/Output Summary (Last 24 hours) at 01/31/2020 1516 Last data filed at 01/31/2020 0900 Gross per 24 hour  Intake 120 ml  Output 625 ml  Net -505 ml   LBM: Last BM Date: 01/29/20 Baseline Weight: Weight: 81.2 kg Most recent weight: Weight: 83.3 kg       Palliative Assessment/Data: PPS 30%      Patient Active Problem List   Diagnosis Date Noted  . Delirium due to another medical condition, acute, hypoactive, due to gram negative bacteremia 01/29/2020  . E coli bacteremia 01/29/2020  . Balance problem 01/28/2020  . AAA (abdominal aortic aneurysm) without rupture ([place]) 01/28/2020  . Aortic calcification ([place])  01/28/2020  . Obstipation 01/28/2020  . Pleural effusion, bilateral   . Peripheral edema   . Palliative care by specialist   . Goals of care, counseling/discussion   . Encounter for hospice care discussion   . Altered mental status   . SOB (shortness of breath) 01/26/2020  . Frailty 01/13/2020  . Deficits in activities of daily living 01/13/2020  . Cor pulmonale, chronic ([place]) 08/30/2019  . Ambulatory dysfunction 08/19/2019  . Venous stasis ulcers of both lower extremities ([place]) 07/05/2019  . Cystitis 03/19/2019  . History of recent fall 03/19/2019  . Atrial fibrillation ([place]), permanent   . Bilateral leg edema 01/10/2017  . Chronic diastolic CHF (congestive heart failure) ([place]) 11/08/2015  . On amiodarone therapy 09/14/2015  . Pure hypercholesterolemia 07/31/2014  . CAD S/P RCA BMS, residual CFX disease 06/08/2014  . Healthcare maintenance 04/08/2014  . Pacemaker 04/02/2013  . Dementia ([place]), possible Alzhemier's type 10/26/2012  . Chronic anticoagulation 10/01/2012  . Urge incontinence 12/13/2011  . Vitamin D deficiency 11/02/2010  . Macular degeneration, bilateral 10/04/2010  . History of Iron deficiency anemia due to chronic GI blood loss 08/05/2010  . Solitary kidney, acquired 05/17/2010  . Spondylolisthesis of lumbar region   . Vitamin B12 deficiency 10/07/2009  . Anemia 09/03/2009  . Chronic kidney disease (CKD), stage IV (severe) ([place]) 09/02/2009  . MYOCARDIAL INFARCTION, HX OF 09/25/2008  . VENTRICULAR HYPERTROPHY, LEFT 08/28/2008  . At risk for diabetes mellitus 09/11/2007  . Tachycardia-bradycardia syndrome ([place]) 04/27/2006  . Allergic rhinitis, seasonal 04/27/2006  . COPD, severe ([place]) 04/27/2006  . GASTROESOPHAGEAL REFLUX, NO ESOPHAGITIS 04/27/2006  . Osteoarthrosis involving lower leg 04/27/2006  . 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  Patient and family are not pursing work up for abdominal mass, knowing that regardless of the results they would not pursue treatment/treatment would not be recommended.  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 Original copy placed in shadow chart; copy will be scanned into [facility]  Attempted to remove durable DNR form from shadow chart but was unable to locate - asked RN to shred if it was found  PMT will continue to follow peripherally. If there are any imminent needs please call the service directly   Goals of Care and Additional Recommendations:  Limitations on Scope of Treatment: Full Scope Treatment, No Surgical Procedures and No Tracheostomy  Code Status:    Code Status Orders  (From admission, onward)         Start     Ordered   01/30/20 2337  Do not attempt resuscitation (DNR)  Continuous       Question Answer Comment  In the event of cardiac or respiratory ARREST Do not call a "code blue"   In the event of cardiac or respiratory ARREST Do not perform Intubation, CPR, defibrillation or ACLS   In the event of cardiac or respiratory ARREST Use medication by any route, position, wound care, and  other measures to relive pain and suffering. May use oxygen, suction and manual treatment of airway obstruction as needed for comfort.      01/30/20 2336        Code Status History    Date Active Date Inactive Code Status Order ID Comments User Context   01/27/2020 1809 01/30/2020 2336 Partial Code [id number]  Collene Gobble, MD Inpatient   01/26/2020 1910 01/27/2020 1807 Full Code [id number]  Alcus Dad, MD Inpatient   01/11/2018 0000 01/15/2018 1948 Full Code [id number]  Lovenia Kim, MD Inpatient   10/16/2017 1930 10/21/2017 1643 Full Code [id number]  Belva Crome, MD ED   08/30/2017 0542 08/30/2017 1920 Full Code [id number]  Kathrene Alu, MD ED   11/08/2015 2327 11/13/2015 1814 Full Code [id number]  Ivor Costa, MD ED   06/14/2015 0418 06/15/2015 2025 Full Code [id number]  McKeag, Marylynn Pearson, MD Inpatient   05/21/2015 0337 05/21/2015 1924 Full Code [id number]  McKeag, Marylynn Pearson, MD ED   02/25/2015 0513 03/06/2015 1731 Full Code [id number]  Veatrice Bourbon, MD ED   05/31/2014 0150 06/01/2014 1840 Full Code [id number]  Skeet Latch, MD ED   03/14/2013 0012 03/14/2013 1719 DNR [id number]  Olam Idler, MD ED   01/08/2012 1929 01/10/2012 1824 DNR [id number]  Parthenia Ames, RN Inpatient   Advance Care Planning Activity    Advance Directive Documentation     Most Recent Value  Type of Advance Directive Healthcare Power of Attorney, Living will  Pre-existing out of facility DNR order (yellow form or pink MOST form) --  "MOST" Form in Place? --  Prognosis:   < 6 months  Discharge Planning:  Home with Hospice  Care plan was discussed with daughter/[name]  Thank you for allowing the Palliative Medicine Team to assist in the care of this patient.   Total Time 15 minutes Prolonged Time Billed  no       Greater than 50%  of this time was spent counseling and coordinating care related to the above assessment and plan.  Lin Landsman, NP  Please contact Palliative Medicine Team  phone at [phone] for questions and concerns.

## 2020-01-31 NOTE — Telephone Encounter (Signed)
Pt going home tomorrow on Hospice and Hospice nurse going out to do consult on Sunday.  Hospice advised the daughter to talk to Dr. [name] about pt's Eliquis.  It will no longer be covered once she is changed to Hospice.  They told her to ask about if she needs to stay on it or can she just take an ASA?  Daughter did mention that pt is currently on Iron because Hgb got down to 7.3 and they were trying to find the bleed.  Daughter would also like for Dr. [name] to review abd CT because an aneurysm was found on there coincidentally.  Daughter mentioned that a mass was found where her kidney was suppose to be.  Advised I will send to Dr. [name] for review.

## 2020-01-31 NOTE — Telephone Encounter (Signed)
Spoke with daughter and made her aware of information from Dr. [name].  Daughter agreeable to plan and appreciative for call.

## 2020-01-31 NOTE — Progress Notes (Signed)
Physical Therapy Treatment Patient Details Name: [name] MRN: [id number] DOB: 1924-01-19 Today's Date: 01/31/2020    History of Present Illness 86 y.o. female presenting with SOB x1 month with O2 desaturation during exertion, LE edema, and AMS with concern for FTT. 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    PT Comments    Pt able to tolerate short duration ambulation with rest between bouts. Activity limited by dyspnea. Pt to return home with hospice hopefully tomorrow.    Follow Up Recommendations  Supervision for mobility/OOB (hospice)     Equipment Recommendations  3in1 (PT);Hospital bed;Rolling walker with 5" wheels;Wheelchair (measurements PT);Wheelchair cushion (measurements PT)    Recommendations for Other Services       Precautions / Restrictions Precautions Precautions: Fall    Mobility  Bed Mobility Overal bed mobility: Needs Assistance Bed Mobility: Sit to Supine       Sit to supine: Min assist   General bed mobility comments: Assist to bring legs back up into recliner  Transfers Overall transfer level: Needs assistance Equipment used: Rolling walker (2 wheeled) Transfers: Sit to/from [facility] Sit to Stand: Min guard         General transfer comment: Assist for safety. Verbal cues for hand placement  Ambulation/Gait Ambulation/Gait assistance: Min guard Gait Distance (Feet): 15 Feet (x 2) Assistive device: Rolling walker (2 wheeled) Gait Pattern/deviations: Step-through pattern;Decreased stride length;Trunk flexed;Wide base of support Gait velocity: decreased Gait velocity interpretation: <1.31 ft/sec, indicative of household ambulator General Gait Details: Assist for safety. Distance limited by dyspnea.    Stairs             Wheelchair Mobility    Modified Rankin (Stroke Patients Only)       Balance Overall balance  assessment: Needs assistance Sitting-balance support: Feet supported;No upper extremity supported Sitting balance-Leahy Scale: Fair     Standing balance support: Bilateral upper extremity supported Standing balance-Leahy Scale: Poor Standing balance comment: walker and supervision for static standing                            Cognition Arousal/Alertness: Awake/alert Behavior During Therapy: WFL for tasks assessed/performed Overall Cognitive Status: History of cognitive impairments - at baseline                                        Exercises      General Comments General comments (skin integrity, edema, etc.): Pt on 2L O2 with SpO2 90% after amb      Pertinent Vitals/Pain      Home Living                      Prior Function            PT Goals (current goals can now be found in the care plan section) Acute Rehab PT Goals Patient Stated Goal: to go home Progress towards PT goals: Progressing toward goals    Frequency    Min 2X/week      PT Plan Discharge plan needs to be updated    Co-evaluation              AM-PAC PT "6 Clicks" Mobility   Outcome Measure  Help needed turning from your back to your side while  in a flat bed without using bedrails?: A Little Help needed moving from lying on your back to sitting on the side of a flat bed without using bedrails?: A Little Help needed moving to and from a bed to a chair (including a wheelchair)?: A Little Help needed standing up from a chair using your arms (e.g., wheelchair or bedside chair)?: A Little Help needed to walk in hospital room?: A Little Help needed climbing 3-5 steps with a railing? : A Little 6 Click Score: 18    End of Session Equipment Utilized During Treatment: Gait belt;Oxygen Activity Tolerance: Patient limited by fatigue (dyspnea) Patient left: with call bell/phone within reach;in bed;with bed alarm set;with family/visitor present   PT Visit  Diagnosis: Unsteadiness on feet (R26.81);Muscle weakness (generalized) (M62.81);Difficulty in walking, not elsewhere classified (R26.2)     Time: 7824-2353 PT Time Calculation (min) (ACUTE ONLY): 37 min  Charges:  $Gait Training: 23-37 mins                     [place] Pager [phone] [place] 01/31/2020, 3:07 PM

## 2020-01-31 NOTE — Telephone Encounter (Signed)
   Hospital admission and records have been reviewed.  Anemia has been a problem.  She has not had gross bleeding but does have iron deficiency suggesting possible GI blood loss.  No work-up has been done.  She is now on hospice with a right adrenal mass, chronic heart failure, and dementia.  Discontinuation of anticoagulation therapy is reasonable given her current clinical condition and the decision for hospice.  There will be an increased risk of thromboembolic complications off anticoagulation therapy.  81 mg aspirin per day is reasonable but will not give the protection needed to prevent these complications.

## 2020-01-31 NOTE — Progress Notes (Signed)
Family Medicine Teaching Service Daily Progress Note Intern Pager: [phone]  Patient name: [name] Medical record number: [id number] Date of birth: [birth year] Age: 86 y.o. Gender: female  Primary Care Provider: McDiarmid, Blane Ohara, MD Consultants: Palliative Care Code Status: Partial  Pt Overview and Major Events to Date:  11/28: Admitted 11/30: Palliative consult   Assessment and Plan: [name] a 86 y.o.femalewho presentedwith 1 month of worsening shortness of breathand altered mental status. PMH is significant forCAD, a-fib, COPD, HFpEF, HTN, CKD Stage IV, Transitional cell carcinoma of ureters/p nephrectomy,anddementia.  Declining Mental State  Hx of Dementia: chronic, stable Palliative care had family meeting yesterday. It appears that all the siblings are in agreement to continue to pursue home hospice. It also appears that during that discussion the decision was made to change the patients code status from "partial" to "DNR/DNI". When [employer] liaison later spoke with family following the family meeting, a decision was made to keep the patient as a partial code. Plan is to continue ongoing [place] conversations after d/c. - Awaiting DME for home hospice - Deliriumprecautions  E. Coli bacteremia: stable Patient with positive blood culture with e. Coli. Likely from urinary source.Unfortunately, no urine culture obtained prior to starting abx. On CTX. Sensitivities have returned and resistant to Ampicillin and Ampicillin/Sulbactam. Has received >48 hours IV abx. - Continue CTX  -Switch to PO Cefdinir, duration of 7 days total  Constipation: acute, resolved  MiraLAX increased to BID yesterday and senna added. Patient reports having a bowel movement yesterday.  - Continue MiraLAX and senna daily  Dyspnea on Exertion  Trace Pleural Effusions:chronic,stable Patient complains of SOB with exertion. On 2L [place] with appropriate saturations. Has not  required PRN albuterol since 11/30.  - Continue Lasix 40 mg daily  - Continue home Albuterol nebs/inhaler  - Supplemental O2 as needed to maintain goal of 88-92%  - PT/OT treat  Normocytic Anemia: chronic, stable Given patient's hx of CAD, transfusion threshold <8.0. Iron studies suggest iron deficiency (ferritin 31, iron 30, saturation ratio 7). -Continueferrous sulfate daily  Abdominal Soft Tissue Mass Found onCT abd/pelvis with oral contrast. Palliative consult with decision to pursue home hospice. Per family's wishes, will not pursue additional work up at this time.  CKD Stage IV  Hx of Transitional Cell Carcinoma of Ureter s/p Nephrectomy: chronic, stable Creatinine 1.75 -Avoid nephrotoxic meds  HFpEF  Cor Pulmonale: chronic, stable EchoshowedEF 50-55%, LV low normal function with mild LV hypertrophy. Moderate tricuspid regurg and mitral regurg. Right ventricular systolic pressure 45. Overall, no significant change from prior echo. -Continue Lasix40mg  PO daily -Monitor volume status -Strict Is/Os, daily weights  Paroxysmal A-Fib: chronic, stable Rate-controlled. Metoprolol decreased to 25mg . -Continue home Eliquis  - 25 mg Metoprolol - Discontinue cardiac telemetry   SevereCOPD: chronic, stable -Continue home Trelegy and albuterol -Goal SpO2 88-92%  HLD: chronic, stable -Continue home Atorvastatin 20mg  daily  FEN/GI: Regular diet PPx: Eliquis    Status is: Inpatient  Remains inpatient appropriate because:Awaiting DME for home hospice   Dispo:  Patient From: Home  Planned Disposition: Home Hospice  Expected discharge date: 02/01/20  Medically stable for discharge: Yes         Subjective:  Patient feels well this morning.  Denies any complaints.  Denies feeling short of breath. States that she is only short of breath when she exerts herself. She is excited about the possibility of going home tomorrow though does state that she will  miss being in the hospital  as she describes this experience as feeling like a "vacation". She is wondering if she would be able to take the urinary device home as it is difficult for her to get up and go to the bathroom.   Objective: Temp:  [97.9 F (36.6 C)-98.2 F (36.8 C)] 97.9 F (36.6 C) (12/03 0439) Pulse Rate:  [75-90] 90 (12/03 0439) Resp:  [18-27] 18 (12/03 0439) BP: (105-120)/(54-82) 105/82 (12/03 0439) SpO2:  [93 %-97 %] 96 % (12/03 0439) Weight:  [80.9 kg-83.3 kg] 83.3 kg (12/03 0439) Physical Exam: General: NAD, pleasant, cooperative Cardiovascular: Irregularly irregular Respiratory: Inspiratory wheezing diffusely in anterior fields, on 3L O2 Abdomen: Soft, normoactive bowel sounds, non-tender in all quadrants, non-distended Extremities: 1-2+ pitting edema RLE from ankle to distal 3rd of leg, 2+ DP and radial pulses  Laboratory: Recent Labs  Lab 01/28/20 0258 01/29/20 0335 01/30/20 0112  WBC 5.8 5.3 5.3  HGB 7.6* 8.3* 8.8*  HCT 27.0* 29.5* 30.6*  PLT 178 166 171   Recent Labs  Lab 01/27/20 0750 01/28/20 0258 01/29/20 0734  NA 141 138 142  K 4.7 4.5 4.8  CL 107 104 104  CO2 23 27 27   BUN 31* 34* 35*  CREATININE 1.85* 1.89* 1.75*  CALCIUM 8.4* 8.3* 8.8*  PROT 5.9*  --   --   BILITOT 0.9  --   --   ALKPHOS 59  --   --   ALT 10  --   --   AST 12*  --   --   GLUCOSE 96 114* 92    Imaging/Diagnostic Tests: None new.  Sharion Settler, DO 01/31/2020, 6:21 AM PGY-1, [place] Intern pager: [phone], text pages welcome

## 2020-01-31 NOTE — Progress Notes (Signed)
[facility] ([facility])  DME ordered to include: O2, hospital bed, 3n1 and walker.  To be delivered Friday afternoon.  Family requests that she be transported home Saturday afternoon.    She will need an ambulance to transport her home.  Thank you, Venia Carbon RN, BSN, [facility] Liaison

## 2020-01-31 NOTE — TOC Progression Note (Signed)
Transition of Care [facility]) - Progression Note    Patient Details  Name: [name] MRN: [id number] Date of Birth: 1923/05/25  Transition of [facility]) CM/SW Contact  Graves-Bigelow, Ocie Cornfield, RN Phone Number: 01/31/2020, 11:36 AM  Clinical Narrative:  Case Manager spoke with daughter [name] regarding plan of care. Family wanted to use [facility]. Liaison has reached out to family regarding durable medical equipment (DME) and the DME will be delivered today. Case Manager discussed with daughter [name] regarding additional assistance with personal care service (PCS) agencies- family can review Medicare.gov list for PCS. Daughter wants mother to travel home via [place] on Saturday-please schedule PTAR at 3:00 pm. Address has been confirmed and medical necessity and face sheet will be completed and placed on the shadow chart for transport. Please call to verify that oxygen has been delivered prior to travel home. Case Manager will continue to follow for additional transition of care needs.    Expected Discharge Plan: Home w Hospice Care Barriers to Discharge: Continued Medical Work up  Expected Discharge Plan and Services Expected Discharge Plan: [place]   Discharge Planning Services: CM Consult Post Acute Care Choice: Hospice Living arrangements for the past 2 months: Single Family Home                   DME Agency: [facility] [place] Date DME Agency Contacted: 01/30/20 Time DME Agency Contacted:  (unsure of time that family was contacted) Representative spoke with at DME Agency: [facility] [place] HH Arranged: RN [facility] Agency: [facility] [place] Date [place]: 01/30/20 Time [place]:  (unsure of time) Representative spoke with at [place]: Venia Carbon   Readmission Risk Interventions No flowsheet data found.

## 2020-01-31 NOTE — Plan of Care (Signed)
  Problem: Clinical Measurements: Goal: Diagnostic test results will improve Outcome: Progressing Goal: Respiratory complications will improve Outcome: Progressing   

## 2020-02-01 DIAGNOSIS — Z7189 Other specified counseling: Secondary | ICD-10-CM | POA: Diagnosis not present

## 2020-02-01 DIAGNOSIS — G309 Alzheimer's disease, unspecified: Secondary | ICD-10-CM | POA: Diagnosis not present

## 2020-02-01 DIAGNOSIS — J90 Pleural effusion, not elsewhere classified: Secondary | ICD-10-CM | POA: Diagnosis not present

## 2020-02-01 DIAGNOSIS — F0280 Dementia in other diseases classified elsewhere without behavioral disturbance: Secondary | ICD-10-CM

## 2020-02-01 DIAGNOSIS — R7881 Bacteremia: Secondary | ICD-10-CM | POA: Diagnosis not present

## 2020-02-01 MED ORDER — FUROSEMIDE 40 MG PO TABS: 40.0000 mg | ORAL_TABLET | Freq: Every day | ORAL | 1 refills | Status: AC | PRN

## 2020-02-01 MED ORDER — SENNA 8.6 MG PO TABS: 1.0000 | ORAL_TABLET | Freq: Every day | ORAL | 0 refills | Status: AC

## 2020-02-01 MED ORDER — FERROUS SULFATE 325 (65 FE) MG PO TABS: 325.0000 mg | ORAL_TABLET | Freq: Every day | ORAL | 1 refills | Status: AC

## 2020-02-01 MED ORDER — CEFDINIR 300 MG PO CAPS: 300.0000 mg | ORAL_CAPSULE | Freq: Every day | ORAL | 0 refills | Status: AC

## 2020-02-01 MED ORDER — POLYETHYLENE GLYCOL 3350 17 G PO PACK: 17.0000 g | PACK | Freq: Two times a day (BID) | ORAL | 0 refills | Status: AC

## 2020-02-01 MED ORDER — METOPROLOL SUCCINATE ER 25 MG PO TB24: 25.0000 mg | ORAL_TABLET | Freq: Every day | ORAL | 1 refills | Status: AC

## 2020-02-01 NOTE — Progress Notes (Signed)
[facility] ([facility])  All DME was delivered and set up last night.  Spoke with dtr [name], they are preparing the house for her arrival.  They have requested ambulance transport, preferably after 3 pm.  [facility] will see pt in the home tomorrow to begin hospice services.  Venia Carbon RN, BSN, [facility] Liaison

## 2020-02-01 NOTE — Progress Notes (Addendum)
Family Medicine Teaching Service Daily Progress Note Intern Pager: [phone]  Patient name: [name] Medical record number: [id number] Date of birth: 1924-02-24 Age: 86 y.o. Gender: female  Primary Care Provider: McDiarmid, Blane Ohara, MD Consultants: Palliative Care Code Status: Partial  Pt Overview and Major Events to Date:  11/28: Admitted 11/30: Palliative consult   Assessment and Plan: [name] a 86 y.o.femalewho presentedwith declining mental status in the setting of chronic dementia, now planning for home hospice. PMH is significant forCAD, a-fib, COPD, HFpEF, HTN, CKD Stage IV, Transitional cell carcinoma of ureters/p nephrectomy,anddementia.  Declining Mental State  Hx of Dementia: chronic, stable Hospice care Patient will be going home today with home hospice, planning for around 3pm.  Remains DNR/DNI per palliative note on 12/3.  Spoke with daughter [name] this AM who reports that things are ready for her to return home today.  She plans to continue iron pills for now, finish antibiotics, and will complete Eliquis that patient has at home, then will change to ASA 81mg  per outpt cardiology instructions. - home today with hospice - palliative will follow as outpatient and continue [place] discussions  E. Coli bacteremia: stable CTX started on 11/30, switched to PO cefdinir on 12/3.  Plan to complete 7 day course. -Switch to PO Cefdinir, duration of 7 days total, last day 12/6  Constipation: acute, resolved  MiraLAX increased to BID yesterday and senna added. Patient reports having a bowel movement yesterday. [name] would like to continue as outpatient. - Continue MiraLAX and senna daily  Dyspnea on Exertion  Trace Pleural Effusions:chronic,stable Currently stable, uses 3L intermittently.  [name] is aware of O2 goal 88-92% and will continue O2 as needed at home. - Continue Lasix 40 mg daily - Continue home Albuterol nebs/inhaler  - Supplemental O2 as needed  to maintain goal of 88-92%  - PT/OT treat  Normocytic Anemia: chronic, stable Given patient's hx of CAD, transfusion threshold <8.0. Iron studies suggest iron deficiency (ferritin 31, iron 30, saturation ratio 7).  Discussed with [name], who will continue ferrous sulfate for now, but may discontinue in the future with constipation.  Advised that this would be acceptable in this situation given GOC. -Continueferrous sulfate daily for now, will decide with hospice RN as outpatient if will continue  Abdominal Soft Tissue Mass Found onCT abd/pelvis with oral contrast. Palliative consult with decision to pursue home hospice. Per family's wishes, will not pursue additional work up at this time.  CKD Stage IV  Hx of Transitional Cell Carcinoma of Ureter s/p Nephrectomy: chronic, stable Creatinine 1.75 -Avoid nephrotoxic meds  HFpEF  Cor Pulmonale: chronic, stable EchoshowedEF 50-55%, LV low normal function with mild LV hypertrophy. Moderate tricuspid regurg and mitral regurg. Right ventricular systolic pressure 45. Overall, no significant change from prior echo. -Continue Lasix40mg  PO daily -Monitor volume status -Strict Is/Os, daily weights  Paroxysmal A-Fib: chronic, stable Rate-controlled. Metoprolol decreased to 25mg .  Per outpatient cardiologist, [name] plans to have patient finish out Eliquis that they have at home, then will switch to 81 mg of aspirin. -Continue home Eliquis until completes bottle, then switch to ASA 81mg  per cardiology recs - 25 mg Metoprolol  SevereCOPD: chronic, stable -Continue home Trelegy and albuterol -Goal SpO2 88-92%  HLD: chronic, stable -Continue home Atorvastatin 20mg  daily  FEN/GI: Regular diet PPx: Eliquis    Status is: Inpatient  Dispo: Discharging today to home hospice.  DME has been received.  [name] will be in hospital around 2.    Subjective:  Patient denies any complaints this morning.  States that she did not sleep  very well overnight and is hungry this morning, but is looking forward to going home this afternoon.  Objective: Temp:  [97.8 F (36.6 C)-98 F (36.7 C)] 98 F (36.7 C) (12/04 0832) Pulse Rate:  [83-98] 98 (12/04 0832) Resp:  [18] 18 (12/04 0832) BP: (114-123)/(65-70) 116/65 (12/04 0832) SpO2:  [96 %-99 %] 98 % (12/04 0832) Weight:  [82.6 kg] 82.6 kg (12/04 0506)  Physical Exam:  General: 86 y.o. female in NAD, pleasant Cardio: irregularly irregular Lungs: occasional mild wheeze throughout lung fields, on 3L O2 per [place] Abdomen: Soft, non-tender to palpation Skin: warm and dry Extremities: 1+ pitting edema BLE   Laboratory: Recent Labs  Lab 01/28/20 0258 01/29/20 0335 01/30/20 0112  WBC 5.8 5.3 5.3  HGB 7.6* 8.3* 8.8*  HCT 27.0* 29.5* 30.6*  PLT 178 166 171   Recent Labs  Lab 01/27/20 0750 01/28/20 0258 01/29/20 0734  NA 141 138 142  K 4.7 4.5 4.8  CL 107 104 104  CO2 23 27 27   BUN 31* 34* 35*  CREATININE 1.85* 1.89* 1.75*  CALCIUM 8.4* 8.3* 8.8*  PROT 5.9*  --   --   BILITOT 0.9  --   --   ALKPHOS 59  --   --   ALT 10  --   --   AST 12*  --   --   GLUCOSE 96 114* 92    Imaging/Diagnostic Tests: None new.  [place], DO 02/01/2020, 9:41 AM PGY-3, [place] Intern pager: [phone], text pages welcome

## 2020-02-01 NOTE — TOC Transition Note (Signed)
Transition of Care [facility]) - CM/SW Discharge Note   Patient Details  Name: [name] MRN: [id number] Date of Birth: Aug 27, 1923  [facility] CM/SW Contact:  Carles Collet, RN Phone Number: 02/01/2020, 11:12 AM   Clinical Narrative:   Verified w hospice and patient's daughter [name] that all DME including O2 has been delivered to the home. She is agreeable to PTAR at 3pm, all forms are on chart. Address verified and PTAR called for 3pm.    Final next level of care: Home w Hospice Care Barriers to Discharge: Continued Medical Work up   Patient Goals and CMS Choice Patient states their goals for this hospitalization and ongoing recovery are:: to return home with hospice CMS Medicare.gov Compare Post Acute Care list provided to:: Other (Comment Required) Choice offered to / list presented to : Adult Children [name])  Discharge Placement                       Discharge Plan and Services   Discharge Planning Services: CM Consult Post Acute Care Choice: Hospice            DME Agency: [facility] [place] Date DME Agency Contacted: 01/30/20 Time DME Agency Contacted:  (unsure of time that family was contacted) Representative spoke with at DME Agency: [facility] Arranged: RN [facility] Agency: Hospice and [place] Date [place]: 01/30/20 Time [place]:  (unsure of time) Representative spoke with at [place]: Venia Carbon  Social Determinants of Health (SDOH) Interventions     Readmission Risk Interventions No flowsheet data found.

## 2020-02-01 NOTE — Discharge Instructions (Signed)
You were hospitalized at [facility] due to increased shortness of breath and confusion.  We expect this is from your COPD and improved with oxygen supplementation. You were also found to have bacteria in your blood which was treated with antibiotics. We suspect that this was caused from your urine and that it was contributing to your confusion. You should continue to take your antibiotics, the last dose will be on 12/6.  You already took your dose today, so take the next dose tomorrow, 12/5.  Due to the decision to pursue home hospice, your Eliquis will be discontinued after you complete the bottle that you have at home, after this, you will take only Aspirin 81mg  daily.   While in the hospital, you were taking Lasix 40mg  daily for your swelling.  You can continue this and decide later if you would like to stop it.  Your metoprolol dose was changed from 37.5mg  to 25mg .  We have sent a new prescription for this.  You can continue to take ferrous sulfate if you would like, this will help anemia, but could also make you constipated.  You can also continue your inhalers.  We will let Dr. McDiarmid about your disposition and you will hear from someone about your disposition.  Palliative medicine will continue to follow with you at home.  Thank you for allowing [place] to take care of you.  Take care, [facility] family medicine team

## 2020-02-02 DIAGNOSIS — Z9981 Dependence on supplemental oxygen: Secondary | ICD-10-CM | POA: Diagnosis not present

## 2020-02-02 DIAGNOSIS — N390 Urinary tract infection, site not specified: Secondary | ICD-10-CM | POA: Diagnosis not present

## 2020-02-02 DIAGNOSIS — Z6830 Body mass index (BMI) 30.0-30.9, adult: Secondary | ICD-10-CM | POA: Diagnosis not present

## 2020-02-02 DIAGNOSIS — F0390 Unspecified dementia without behavioral disturbance: Secondary | ICD-10-CM | POA: Diagnosis not present

## 2020-02-02 DIAGNOSIS — J449 Chronic obstructive pulmonary disease, unspecified: Secondary | ICD-10-CM | POA: Diagnosis not present

## 2020-02-02 DIAGNOSIS — N189 Chronic kidney disease, unspecified: Secondary | ICD-10-CM | POA: Diagnosis not present

## 2020-02-02 DIAGNOSIS — I129 Hypertensive chronic kidney disease with stage 1 through stage 4 chronic kidney disease, or unspecified chronic kidney disease: Secondary | ICD-10-CM | POA: Diagnosis not present

## 2020-02-02 DIAGNOSIS — C7989 Secondary malignant neoplasm of other specified sites: Secondary | ICD-10-CM | POA: Diagnosis not present

## 2020-02-02 DIAGNOSIS — I4891 Unspecified atrial fibrillation: Secondary | ICD-10-CM | POA: Diagnosis not present

## 2020-02-02 DIAGNOSIS — I2510 Atherosclerotic heart disease of native coronary artery without angina pectoris: Secondary | ICD-10-CM | POA: Diagnosis not present

## 2020-02-02 DIAGNOSIS — C669 Malignant neoplasm of unspecified ureter: Secondary | ICD-10-CM | POA: Diagnosis not present

## 2020-02-03 ENCOUNTER — Ambulatory Visit: Payer: BLUE CROSS/BLUE SHIELD | Admitting: Acute Care

## 2020-02-03 DIAGNOSIS — I4891 Unspecified atrial fibrillation: Secondary | ICD-10-CM | POA: Diagnosis not present

## 2020-02-03 DIAGNOSIS — I129 Hypertensive chronic kidney disease with stage 1 through stage 4 chronic kidney disease, or unspecified chronic kidney disease: Secondary | ICD-10-CM | POA: Diagnosis not present

## 2020-02-03 DIAGNOSIS — I2510 Atherosclerotic heart disease of native coronary artery without angina pectoris: Secondary | ICD-10-CM | POA: Diagnosis not present

## 2020-02-03 DIAGNOSIS — J449 Chronic obstructive pulmonary disease, unspecified: Secondary | ICD-10-CM | POA: Diagnosis not present

## 2020-02-03 DIAGNOSIS — C669 Malignant neoplasm of unspecified ureter: Secondary | ICD-10-CM | POA: Diagnosis not present

## 2020-02-03 DIAGNOSIS — C7989 Secondary malignant neoplasm of other specified sites: Secondary | ICD-10-CM | POA: Diagnosis not present

## 2020-02-05 DIAGNOSIS — C7989 Secondary malignant neoplasm of other specified sites: Secondary | ICD-10-CM | POA: Diagnosis not present

## 2020-02-05 DIAGNOSIS — C669 Malignant neoplasm of unspecified ureter: Secondary | ICD-10-CM | POA: Diagnosis not present

## 2020-02-05 DIAGNOSIS — J449 Chronic obstructive pulmonary disease, unspecified: Secondary | ICD-10-CM | POA: Diagnosis not present

## 2020-02-05 DIAGNOSIS — I2510 Atherosclerotic heart disease of native coronary artery without angina pectoris: Secondary | ICD-10-CM | POA: Diagnosis not present

## 2020-02-05 DIAGNOSIS — I4891 Unspecified atrial fibrillation: Secondary | ICD-10-CM | POA: Diagnosis not present

## 2020-02-05 DIAGNOSIS — I129 Hypertensive chronic kidney disease with stage 1 through stage 4 chronic kidney disease, or unspecified chronic kidney disease: Secondary | ICD-10-CM | POA: Diagnosis not present

## 2020-02-07 DIAGNOSIS — I2510 Atherosclerotic heart disease of native coronary artery without angina pectoris: Secondary | ICD-10-CM | POA: Diagnosis not present

## 2020-02-07 DIAGNOSIS — I4891 Unspecified atrial fibrillation: Secondary | ICD-10-CM | POA: Diagnosis not present

## 2020-02-07 DIAGNOSIS — J449 Chronic obstructive pulmonary disease, unspecified: Secondary | ICD-10-CM | POA: Diagnosis not present

## 2020-02-07 DIAGNOSIS — C7989 Secondary malignant neoplasm of other specified sites: Secondary | ICD-10-CM | POA: Diagnosis not present

## 2020-02-07 DIAGNOSIS — I129 Hypertensive chronic kidney disease with stage 1 through stage 4 chronic kidney disease, or unspecified chronic kidney disease: Secondary | ICD-10-CM | POA: Diagnosis not present

## 2020-02-07 DIAGNOSIS — C669 Malignant neoplasm of unspecified ureter: Secondary | ICD-10-CM | POA: Diagnosis not present

## 2020-02-10 DIAGNOSIS — I4891 Unspecified atrial fibrillation: Secondary | ICD-10-CM | POA: Diagnosis not present

## 2020-02-10 DIAGNOSIS — C669 Malignant neoplasm of unspecified ureter: Secondary | ICD-10-CM | POA: Diagnosis not present

## 2020-02-10 DIAGNOSIS — I2510 Atherosclerotic heart disease of native coronary artery without angina pectoris: Secondary | ICD-10-CM | POA: Diagnosis not present

## 2020-02-10 DIAGNOSIS — J449 Chronic obstructive pulmonary disease, unspecified: Secondary | ICD-10-CM | POA: Diagnosis not present

## 2020-02-10 DIAGNOSIS — C7989 Secondary malignant neoplasm of other specified sites: Secondary | ICD-10-CM | POA: Diagnosis not present

## 2020-02-10 DIAGNOSIS — I129 Hypertensive chronic kidney disease with stage 1 through stage 4 chronic kidney disease, or unspecified chronic kidney disease: Secondary | ICD-10-CM | POA: Diagnosis not present

## 2020-02-11 ENCOUNTER — Other Ambulatory Visit: Payer: Self-pay

## 2020-02-11 ENCOUNTER — Telehealth: Payer: Self-pay | Admitting: Emergency Medicine

## 2020-02-11 DIAGNOSIS — J449 Chronic obstructive pulmonary disease, unspecified: Secondary | ICD-10-CM | POA: Diagnosis not present

## 2020-02-11 DIAGNOSIS — I4891 Unspecified atrial fibrillation: Secondary | ICD-10-CM | POA: Diagnosis not present

## 2020-02-11 DIAGNOSIS — I129 Hypertensive chronic kidney disease with stage 1 through stage 4 chronic kidney disease, or unspecified chronic kidney disease: Secondary | ICD-10-CM | POA: Diagnosis not present

## 2020-02-11 DIAGNOSIS — I2510 Atherosclerotic heart disease of native coronary artery without angina pectoris: Secondary | ICD-10-CM | POA: Diagnosis not present

## 2020-02-11 DIAGNOSIS — C669 Malignant neoplasm of unspecified ureter: Secondary | ICD-10-CM | POA: Diagnosis not present

## 2020-02-11 DIAGNOSIS — C7989 Secondary malignant neoplasm of other specified sites: Secondary | ICD-10-CM | POA: Diagnosis not present

## 2020-02-11 MED ORDER — TRELEGY ELLIPTA 200-62.5-25 MCG/INH IN AEPB: 1.0000 | INHALATION_SPRAY | Freq: Every day | RESPIRATORY_TRACT | 0 refills | Status: AC

## 2020-02-11 NOTE — Telephone Encounter (Signed)
We will work on getting her some samples - I can take them to her.  Also, I will forward to the Pharmacist's pool to see if there are any options for assistance. If not then we will have to consider nebulized regimen.

## 2020-02-11 NOTE — Progress Notes (Signed)
Error

## 2020-02-11 NOTE — Telephone Encounter (Signed)
Called and spoke with daughter [name] who states that patient is now under Hospice care and Trelegy is not covered under it. She also states that patient is now on oxygen 2-3 liters all the time. She states that she does not think that brand name inhalers are covered. I'm not sure if patient assistance is an option or if patient needs to be switched to nebulized medications due to hospice.   Dr. Lamonte Sakai please advise

## 2020-02-11 NOTE — Telephone Encounter (Signed)
Samples have been given to Dr. Lamonte Sakai by his nurse Estill Bamberg.  Nothing further needed at this time from a clinical standpoint- will route back to the pharmacy team to look into other options.

## 2020-02-12 DIAGNOSIS — C7989 Secondary malignant neoplasm of other specified sites: Secondary | ICD-10-CM | POA: Diagnosis not present

## 2020-02-12 DIAGNOSIS — J449 Chronic obstructive pulmonary disease, unspecified: Secondary | ICD-10-CM | POA: Diagnosis not present

## 2020-02-12 DIAGNOSIS — I2510 Atherosclerotic heart disease of native coronary artery without angina pectoris: Secondary | ICD-10-CM | POA: Diagnosis not present

## 2020-02-12 DIAGNOSIS — I4891 Unspecified atrial fibrillation: Secondary | ICD-10-CM | POA: Diagnosis not present

## 2020-02-12 DIAGNOSIS — C669 Malignant neoplasm of unspecified ureter: Secondary | ICD-10-CM | POA: Diagnosis not present

## 2020-02-12 DIAGNOSIS — I129 Hypertensive chronic kidney disease with stage 1 through stage 4 chronic kidney disease, or unspecified chronic kidney disease: Secondary | ICD-10-CM | POA: Diagnosis not present

## 2020-02-12 NOTE — Telephone Encounter (Signed)
Patient's copay for Trelegy is $158.11 copay because she is in the coverage gap. Patient can apply for [employer] patient assistance for this inhaler.  Or if nebulized medications are chosen, her BCBS supplement should pick up all costs after Medicare. Leaving little to no cost.

## 2020-02-13 ENCOUNTER — Ambulatory Visit: Payer: Medicare Other | Admitting: Family Medicine

## 2020-02-13 VITALS — BP 115/72 | Wt 178.8 lb

## 2020-02-13 DIAGNOSIS — S81812A Laceration without foreign body, left lower leg, initial encounter: Secondary | ICD-10-CM

## 2020-02-13 DIAGNOSIS — R2689 Other abnormalities of gait and mobility: Secondary | ICD-10-CM

## 2020-02-13 DIAGNOSIS — Z7901 Long term (current) use of anticoagulants: Secondary | ICD-10-CM

## 2020-02-13 DIAGNOSIS — R0602 Shortness of breath: Secondary | ICD-10-CM | POA: Diagnosis not present

## 2020-02-13 DIAGNOSIS — Z7189 Other specified counseling: Secondary | ICD-10-CM

## 2020-02-13 DIAGNOSIS — R600 Localized edema: Secondary | ICD-10-CM | POA: Diagnosis not present

## 2020-02-13 NOTE — Telephone Encounter (Signed)
Called and spoke with [name] about inhaler samples and [employer] patient assistance paperwork. Advised her that Dr. Lamonte Sakai took samples by for the patient and gave them to [facility]. She asked if we could email her the [place] paperwork I informed her that we could not but if it was easier she could get it off google and print it then it would just need to be brought to the office to be completed and faxed in. She expressed understanding and said that she would print it off and then bring it by next week. Nothing further needed at this time.

## 2020-02-13 NOTE — Telephone Encounter (Signed)
Please initiate the assistance process for the trelegy. She believes that it works better than the alternatives.

## 2020-02-14 DIAGNOSIS — C669 Malignant neoplasm of unspecified ureter: Secondary | ICD-10-CM | POA: Diagnosis not present

## 2020-02-14 DIAGNOSIS — J449 Chronic obstructive pulmonary disease, unspecified: Secondary | ICD-10-CM | POA: Diagnosis not present

## 2020-02-14 DIAGNOSIS — I2510 Atherosclerotic heart disease of native coronary artery without angina pectoris: Secondary | ICD-10-CM | POA: Diagnosis not present

## 2020-02-14 DIAGNOSIS — C7989 Secondary malignant neoplasm of other specified sites: Secondary | ICD-10-CM | POA: Diagnosis not present

## 2020-02-14 DIAGNOSIS — I4891 Unspecified atrial fibrillation: Secondary | ICD-10-CM | POA: Diagnosis not present

## 2020-02-14 DIAGNOSIS — I129 Hypertensive chronic kidney disease with stage 1 through stage 4 chronic kidney disease, or unspecified chronic kidney disease: Secondary | ICD-10-CM | POA: Diagnosis not present

## 2020-02-15 DIAGNOSIS — I4891 Unspecified atrial fibrillation: Secondary | ICD-10-CM | POA: Diagnosis not present

## 2020-02-15 DIAGNOSIS — I129 Hypertensive chronic kidney disease with stage 1 through stage 4 chronic kidney disease, or unspecified chronic kidney disease: Secondary | ICD-10-CM | POA: Diagnosis not present

## 2020-02-15 DIAGNOSIS — J449 Chronic obstructive pulmonary disease, unspecified: Secondary | ICD-10-CM | POA: Diagnosis not present

## 2020-02-15 DIAGNOSIS — C7989 Secondary malignant neoplasm of other specified sites: Secondary | ICD-10-CM | POA: Diagnosis not present

## 2020-02-15 DIAGNOSIS — C669 Malignant neoplasm of unspecified ureter: Secondary | ICD-10-CM | POA: Diagnosis not present

## 2020-02-15 DIAGNOSIS — I2510 Atherosclerotic heart disease of native coronary artery without angina pectoris: Secondary | ICD-10-CM | POA: Diagnosis not present

## 2020-02-15 NOTE — Progress Notes (Signed)
SUBJECTIVE:   CHIEF COMPLAINT / HPI: Home health Visit  Home Visit: patient is a pleasant 86 year old patient with recent decline in health now under hospice care being seen via Home Visit by Dr. Sherren Mocha McDiarmid and resident Dr. Milus Banister.  The patient's daughter, Truman Hayward, was also present for this home visit.  The patient was being followed up for a hospital admission (admitted 01/26/2020) in which the patient presented for about 1 month of worsening shortness of breath, increased confusion/intermittently altered with history of dementia, and E. coli bacteremia.  Patient was treated with supplemental oxygen and Lasix for her worsening shortness of breath, and started on cefdinir for the E. coli bacteremia.  Her altered mentation and breathing subsequently improved.  Palliative care team was consulted in the hospital and patient was discharged home under hospice.  Today during the home visit patient reports she is feeling somewhat better.  She is able to ambulate with a walker in her home while on oxygen.  She continues to have some shortness of breath however it is more manageable.  New skin tear: Patient reports she bumped her lower leg against an object in her home and subsequently tore the skin.  She has been keeping the wound covered with a Band-Aid.  PERTINENT  PMH / PSH: Severe COPD, GERD, bilateral macular degeneration, OA, AAA, venous stasis in her lower extremities, A. fib CHF, dementia,   OBJECTIVE:   BP 115/72   Wt 178 lb 12.8 oz (81.1 kg)   SpO2 98%   BMI 29.75 kg/m    Physical exam: General: Nontoxic-appearing, very pleasant patient Respiratory: Comfortable work of breathing on supplemental O2, distant coarse breath sounds with left lower lobe crackles (possibly gravity dependent secondary to patient lying on left side) Cardio: RRR, S1-S2 present Abdomen: Left soft tissue mass, normal bowel sounds appreciated Extremities: 2+ bilateral lower extremity pitting edema,  LLE with 2 x 1.5 x 1.5 cm skin tear with minimal slough, mild bleeding appreciated to wound, no evidence of infection.     ASSESSMENT/PLAN:   Bilateral leg edema 1+ bilateral lower extremity edema improved from prior exam.  Patient with echocardiogram November 2021 showing LVEF 19-37%, diastolic function indeterminate, LVH.  Patient with known history of venous insufficiency.  Was recently placed on Lasix 40 mg daily (used to take as needed) due to bilateral pleural effusions appreciated on imaging at recent hospitalization. -Continue Lasix 40 mg daily -Elevate legs whenever possible  SOB (shortness of breath) Improved with supplemental oxygen, albuterol, Trelegy Ellipta, possibly improved with daily Lasix. -Continue home albuterol, Trelegy Ellipta, and supplemental oxygen -Continue Lasix 40mg  daily  Skin tear of left lower leg without complication -Hydrocolloid dressing placed 02/13/2020, to be changed 02/19/2020  Chronic anticoagulation Patient no longer on Eliquis, instead is on aspirin -Continue aspirin  Balance problem Patient continues to have some difficulties with ambulation, however does well to ambulate with a walker. -Encouraging ambulation as able -Continue to ambulate with walker  Goals of care, counseling/discussion Per patient's discharge summary from hospital 02/01/2020, the following need to be addressed: -Continue discussion regarding goals of care/CODE STATUS -patient was a partial code at time of discharge -Family to discontinue Eliquis after finishing what she has at home, then to continue ASA 81 mg daily -Continue iron supplementation for now for chronic anemia, however continue to readdress readdress the need, especially given chronic constipation. -Continue to reassess need for daily Lasix 40 mg -Constipation: Follow-up while on MiraLAX, senna -Completes course of cefdinir for  E. coli bacteremia on 12/6     Daisy Floro, [place]

## 2020-02-16 DIAGNOSIS — S81812A Laceration without foreign body, left lower leg, initial encounter: Secondary | ICD-10-CM | POA: Insufficient documentation

## 2020-02-16 NOTE — Assessment & Plan Note (Signed)
1+ bilateral lower extremity edema improved from prior exam.  Patient with echocardiogram November 2021 showing LVEF 11-02%, diastolic function indeterminate, LVH.  Patient with known history of venous insufficiency.  Was recently placed on Lasix 40 mg daily (used to take as needed) due to bilateral pleural effusions appreciated on imaging at recent hospitalization. -Continue Lasix 40 mg daily -Elevate legs whenever possible

## 2020-02-16 NOTE — Assessment & Plan Note (Signed)
Patient continues to have some difficulties with ambulation, however does well to ambulate with a walker. -Encouraging ambulation as able -Continue to ambulate with walker

## 2020-02-16 NOTE — Assessment & Plan Note (Signed)
Patient no longer on Eliquis, instead is on aspirin -Continue aspirin

## 2020-02-16 NOTE — Assessment & Plan Note (Signed)
Per patient's discharge summary from hospital 02/01/2020, the following need to be addressed: -Continue discussion regarding goals of care/CODE STATUS -patient was a partial code at time of discharge -Family to discontinue Eliquis after finishing what she has at home, then to continue ASA 81 mg daily -Continue iron supplementation for now for chronic anemia, however continue to readdress readdress the need, especially given chronic constipation. -Continue to reassess need for daily Lasix 40 mg -Constipation: Follow-up while on MiraLAX, senna -Completes course of cefdinir for E. coli bacteremia on 12/6

## 2020-02-16 NOTE — Assessment & Plan Note (Signed)
-  Hydrocolloid dressing placed 02/13/2020, to be changed 02/19/2020

## 2020-02-16 NOTE — Assessment & Plan Note (Signed)
Improved with supplemental oxygen, albuterol, Trelegy Ellipta, possibly improved with daily Lasix. -Continue home albuterol, Trelegy Ellipta, and supplemental oxygen -Continue Lasix 40mg  daily

## 2020-02-17 ENCOUNTER — Encounter: Payer: Self-pay | Admitting: Family Medicine

## 2020-02-17 DIAGNOSIS — I129 Hypertensive chronic kidney disease with stage 1 through stage 4 chronic kidney disease, or unspecified chronic kidney disease: Secondary | ICD-10-CM | POA: Diagnosis not present

## 2020-02-17 DIAGNOSIS — I4891 Unspecified atrial fibrillation: Secondary | ICD-10-CM | POA: Diagnosis not present

## 2020-02-17 DIAGNOSIS — C7989 Secondary malignant neoplasm of other specified sites: Secondary | ICD-10-CM | POA: Diagnosis not present

## 2020-02-17 DIAGNOSIS — C669 Malignant neoplasm of unspecified ureter: Secondary | ICD-10-CM | POA: Diagnosis not present

## 2020-02-17 DIAGNOSIS — I2510 Atherosclerotic heart disease of native coronary artery without angina pectoris: Secondary | ICD-10-CM | POA: Diagnosis not present

## 2020-02-17 DIAGNOSIS — J449 Chronic obstructive pulmonary disease, unspecified: Secondary | ICD-10-CM | POA: Diagnosis not present

## 2020-02-17 NOTE — Progress Notes (Signed)
Patient ID: [name], female   DOB: Apr 14, 1923, 86 y.o.   MRN: [id number] I have interviewed and examined the [name] with Dr Lemmie Evens. [name]  I agree with their documentation and management in their note for today.   [name] level of consciousness, cognition, and physical ability is much improved from when I saw her during her recent hospitalization.    Patient is enrolled in hospice and is receiving excellent care from hospice and her daughters, including Lorri Fukuhara who is living with [name].

## 2020-02-18 ENCOUNTER — Telehealth: Payer: BLUE CROSS/BLUE SHIELD

## 2020-02-18 ENCOUNTER — Telehealth: Payer: Self-pay | Admitting: Licensed Clinical Social Worker

## 2020-02-18 DIAGNOSIS — I4891 Unspecified atrial fibrillation: Secondary | ICD-10-CM | POA: Diagnosis not present

## 2020-02-18 DIAGNOSIS — I129 Hypertensive chronic kidney disease with stage 1 through stage 4 chronic kidney disease, or unspecified chronic kidney disease: Secondary | ICD-10-CM | POA: Diagnosis not present

## 2020-02-18 DIAGNOSIS — C669 Malignant neoplasm of unspecified ureter: Secondary | ICD-10-CM | POA: Diagnosis not present

## 2020-02-18 DIAGNOSIS — J449 Chronic obstructive pulmonary disease, unspecified: Secondary | ICD-10-CM | POA: Diagnosis not present

## 2020-02-18 DIAGNOSIS — C7989 Secondary malignant neoplasm of other specified sites: Secondary | ICD-10-CM | POA: Diagnosis not present

## 2020-02-18 DIAGNOSIS — I2510 Atherosclerotic heart disease of native coronary artery without angina pectoris: Secondary | ICD-10-CM | POA: Diagnosis not present

## 2020-02-18 NOTE — Chronic Care Management (AMB) (Signed)
    Clinical Social Work  Care Management  Unsuccessful Phone Outreach   02/18/2020 Name: [name] MRN: [id number] DOB: [birth year]  [name] is a 86 y.o. year old female who is a primary care patient of McDiarmid, Blane Ohara, MD . Patient is currently followed by Hospice. No needs were identified during last LCSW encounter.  F/U phone call to [name]' daughter [name] today to assess needs from previous encounter.  A HIPPA compliant phone message was left for the patient's daughter providing contact information and requesting a return call if she had questions or needed additional support/ resources.   Plan: LCSW will wait for return call.  No further follow up required: by LCSW at this time. LCSW will disconnect from care team if no needs are identified in 30 days.  Review of patient status, including review of consultants reports, relevant laboratory and other test results, and collaboration with appropriate care team members and the patient's provider was performed as part of comprehensive patient evaluation and provision of care management services.    Casimer Lanius, [place] / [place]   [phone] 9:23 AM

## 2020-02-19 DIAGNOSIS — C669 Malignant neoplasm of unspecified ureter: Secondary | ICD-10-CM | POA: Diagnosis not present

## 2020-02-19 DIAGNOSIS — J449 Chronic obstructive pulmonary disease, unspecified: Secondary | ICD-10-CM | POA: Diagnosis not present

## 2020-02-19 DIAGNOSIS — I2510 Atherosclerotic heart disease of native coronary artery without angina pectoris: Secondary | ICD-10-CM | POA: Diagnosis not present

## 2020-02-19 DIAGNOSIS — C7989 Secondary malignant neoplasm of other specified sites: Secondary | ICD-10-CM | POA: Diagnosis not present

## 2020-02-19 DIAGNOSIS — I4891 Unspecified atrial fibrillation: Secondary | ICD-10-CM | POA: Diagnosis not present

## 2020-02-19 DIAGNOSIS — I129 Hypertensive chronic kidney disease with stage 1 through stage 4 chronic kidney disease, or unspecified chronic kidney disease: Secondary | ICD-10-CM | POA: Diagnosis not present

## 2020-02-21 DIAGNOSIS — I4891 Unspecified atrial fibrillation: Secondary | ICD-10-CM | POA: Diagnosis not present

## 2020-02-21 DIAGNOSIS — I2510 Atherosclerotic heart disease of native coronary artery without angina pectoris: Secondary | ICD-10-CM | POA: Diagnosis not present

## 2020-02-21 DIAGNOSIS — C669 Malignant neoplasm of unspecified ureter: Secondary | ICD-10-CM | POA: Diagnosis not present

## 2020-02-21 DIAGNOSIS — I129 Hypertensive chronic kidney disease with stage 1 through stage 4 chronic kidney disease, or unspecified chronic kidney disease: Secondary | ICD-10-CM | POA: Diagnosis not present

## 2020-02-21 DIAGNOSIS — C7989 Secondary malignant neoplasm of other specified sites: Secondary | ICD-10-CM | POA: Diagnosis not present

## 2020-02-21 DIAGNOSIS — J449 Chronic obstructive pulmonary disease, unspecified: Secondary | ICD-10-CM | POA: Diagnosis not present

## 2020-02-24 DIAGNOSIS — C669 Malignant neoplasm of unspecified ureter: Secondary | ICD-10-CM | POA: Diagnosis not present

## 2020-02-24 DIAGNOSIS — I129 Hypertensive chronic kidney disease with stage 1 through stage 4 chronic kidney disease, or unspecified chronic kidney disease: Secondary | ICD-10-CM | POA: Diagnosis not present

## 2020-02-24 DIAGNOSIS — I4891 Unspecified atrial fibrillation: Secondary | ICD-10-CM | POA: Diagnosis not present

## 2020-02-24 DIAGNOSIS — J449 Chronic obstructive pulmonary disease, unspecified: Secondary | ICD-10-CM | POA: Diagnosis not present

## 2020-02-24 DIAGNOSIS — I2510 Atherosclerotic heart disease of native coronary artery without angina pectoris: Secondary | ICD-10-CM | POA: Diagnosis not present

## 2020-02-24 DIAGNOSIS — C7989 Secondary malignant neoplasm of other specified sites: Secondary | ICD-10-CM | POA: Diagnosis not present

## 2020-02-25 ENCOUNTER — Other Ambulatory Visit: Payer: Self-pay | Admitting: Emergency Medicine

## 2020-02-25 ENCOUNTER — Other Ambulatory Visit: Payer: Self-pay

## 2020-02-25 DIAGNOSIS — I2510 Atherosclerotic heart disease of native coronary artery without angina pectoris: Secondary | ICD-10-CM | POA: Diagnosis not present

## 2020-02-25 DIAGNOSIS — I129 Hypertensive chronic kidney disease with stage 1 through stage 4 chronic kidney disease, or unspecified chronic kidney disease: Secondary | ICD-10-CM | POA: Diagnosis not present

## 2020-02-25 DIAGNOSIS — C7989 Secondary malignant neoplasm of other specified sites: Secondary | ICD-10-CM | POA: Diagnosis not present

## 2020-02-25 DIAGNOSIS — C669 Malignant neoplasm of unspecified ureter: Secondary | ICD-10-CM | POA: Diagnosis not present

## 2020-02-25 DIAGNOSIS — J449 Chronic obstructive pulmonary disease, unspecified: Secondary | ICD-10-CM | POA: Diagnosis not present

## 2020-02-25 DIAGNOSIS — I4891 Unspecified atrial fibrillation: Secondary | ICD-10-CM | POA: Diagnosis not present

## 2020-02-26 DIAGNOSIS — I129 Hypertensive chronic kidney disease with stage 1 through stage 4 chronic kidney disease, or unspecified chronic kidney disease: Secondary | ICD-10-CM | POA: Diagnosis not present

## 2020-02-26 DIAGNOSIS — I4891 Unspecified atrial fibrillation: Secondary | ICD-10-CM | POA: Diagnosis not present

## 2020-02-26 DIAGNOSIS — C669 Malignant neoplasm of unspecified ureter: Secondary | ICD-10-CM | POA: Diagnosis not present

## 2020-02-26 DIAGNOSIS — J449 Chronic obstructive pulmonary disease, unspecified: Secondary | ICD-10-CM | POA: Diagnosis not present

## 2020-02-26 DIAGNOSIS — I2510 Atherosclerotic heart disease of native coronary artery without angina pectoris: Secondary | ICD-10-CM | POA: Diagnosis not present

## 2020-02-26 DIAGNOSIS — C7989 Secondary malignant neoplasm of other specified sites: Secondary | ICD-10-CM | POA: Diagnosis not present

## 2020-02-26 MED ORDER — ALBUTEROL SULFATE HFA 108 (90 BASE) MCG/ACT IN AERS: 2.0000 | INHALATION_SPRAY | RESPIRATORY_TRACT | 5 refills | Status: AC | PRN

## 2020-02-27 DIAGNOSIS — J449 Chronic obstructive pulmonary disease, unspecified: Secondary | ICD-10-CM | POA: Diagnosis not present

## 2020-02-27 DIAGNOSIS — I129 Hypertensive chronic kidney disease with stage 1 through stage 4 chronic kidney disease, or unspecified chronic kidney disease: Secondary | ICD-10-CM | POA: Diagnosis not present

## 2020-02-27 DIAGNOSIS — C669 Malignant neoplasm of unspecified ureter: Secondary | ICD-10-CM | POA: Diagnosis not present

## 2020-02-27 DIAGNOSIS — I2510 Atherosclerotic heart disease of native coronary artery without angina pectoris: Secondary | ICD-10-CM | POA: Diagnosis not present

## 2020-02-27 DIAGNOSIS — I4891 Unspecified atrial fibrillation: Secondary | ICD-10-CM | POA: Diagnosis not present

## 2020-02-27 DIAGNOSIS — C7989 Secondary malignant neoplasm of other specified sites: Secondary | ICD-10-CM | POA: Diagnosis not present

## 2020-02-29 DEATH — deceased

## 2020-03-03 ENCOUNTER — Telehealth: Payer: Self-pay | Admitting: Family Medicine

## 2020-03-03 NOTE — Telephone Encounter (Signed)
I notified Nada Boozer that Mrs Andrada's Death Certificate registration has been certified/signed.

## 2020-03-03 NOTE — Telephone Encounter (Signed)
Kent from funeral home is calling to check the status of the death certificate on patient, he states it was sent to [employer] online and he is unable to print it out now to bring it in. They are needing this done as soon as possible.  Phone number for Nada Boozer is [phone]. Thanks

## 2020-03-04 NOTE — Telephone Encounter (Signed)
Ronita Hipps from the health department is calling to let doctor know that he is needing to un-sign d/c due to completing date pronounced section that section is reserved for medical examiner. Once that information is taken out please resign. Thanks  Nakai's cell number  [phone] for any questions.

## 2020-03-05 NOTE — Telephone Encounter (Signed)
I spoke with [name].  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   Hopefully I have completed the form corretly.  I asked [name] to contact me if I have not.

## 2021-02-28 DEATH — deceased
